# Patient Record
Sex: Male | Born: 1940
Health system: Southern US, Community
[De-identification: ages and names within clinical notes are randomized; demographics above are authoritative.]

## PROBLEM LIST (undated history)

## (undated) DIAGNOSIS — E785 Hyperlipidemia, unspecified: Secondary | ICD-10-CM

## (undated) DIAGNOSIS — K648 Other hemorrhoids: Secondary | ICD-10-CM

## (undated) DIAGNOSIS — R600 Localized edema: Secondary | ICD-10-CM

## (undated) DIAGNOSIS — K219 Gastro-esophageal reflux disease without esophagitis: Secondary | ICD-10-CM

## (undated) DIAGNOSIS — R609 Edema, unspecified: Secondary | ICD-10-CM

## (undated) DIAGNOSIS — R0989 Other specified symptoms and signs involving the circulatory and respiratory systems: Secondary | ICD-10-CM

## (undated) DIAGNOSIS — C801 Malignant (primary) neoplasm, unspecified: Secondary | ICD-10-CM

## (undated) DIAGNOSIS — E1165 Type 2 diabetes mellitus with hyperglycemia: Secondary | ICD-10-CM

## (undated) DIAGNOSIS — I69351 Hemiplegia and hemiparesis following cerebral infarction affecting right dominant side: Secondary | ICD-10-CM

## (undated) DIAGNOSIS — I6389 Other cerebral infarction: Secondary | ICD-10-CM

## (undated) DIAGNOSIS — Z860100 Personal history of colon polyps, unspecified: Secondary | ICD-10-CM

## (undated) DIAGNOSIS — I219 Acute myocardial infarction, unspecified: Secondary | ICD-10-CM

## (undated) DIAGNOSIS — M1990 Unspecified osteoarthritis, unspecified site: Secondary | ICD-10-CM

## (undated) DIAGNOSIS — J392 Other diseases of pharynx: Secondary | ICD-10-CM

## (undated) DIAGNOSIS — J302 Other seasonal allergic rhinitis: Secondary | ICD-10-CM

## (undated) DIAGNOSIS — E1140 Type 2 diabetes mellitus with diabetic neuropathy, unspecified: Secondary | ICD-10-CM

## (undated) DIAGNOSIS — I10 Essential (primary) hypertension: Secondary | ICD-10-CM

## (undated) DIAGNOSIS — I2510 Atherosclerotic heart disease of native coronary artery without angina pectoris: Secondary | ICD-10-CM

## (undated) DIAGNOSIS — I639 Cerebral infarction, unspecified: Secondary | ICD-10-CM

## (undated) DIAGNOSIS — J189 Pneumonia, unspecified organism: Secondary | ICD-10-CM

## (undated) HISTORY — DX: Atherosclerotic heart disease of native coronary artery without angina pectoris: I25.10

## (undated) HISTORY — DX: Other cerebral infarction: I63.89

## (undated) HISTORY — DX: Malignant (primary) neoplasm, unspecified: C80.1

## (undated) HISTORY — DX: Other hemorrhoids: K64.8

## (undated) HISTORY — DX: Type 2 diabetes mellitus with diabetic neuropathy, unspecified: E11.40

## (undated) HISTORY — PX: NASAL SEPTUM SURGERY: SHX37

## (undated) HISTORY — DX: Gastro-esophageal reflux disease without esophagitis: K21.9

## (undated) HISTORY — DX: Hemiplegia and hemiparesis following cerebral infarction affecting right dominant side: I69.351

## (undated) HISTORY — PX: BALLOON ANGIOPLASTY, ARTERY: SHX564

## (undated) HISTORY — DX: Hyperlipidemia, unspecified: E78.5

## (undated) HISTORY — DX: Type 2 diabetes mellitus with hyperglycemia: E11.65

## (undated) HISTORY — DX: Other specified symptoms and signs involving the circulatory and respiratory systems: R09.89

## (undated) HISTORY — DX: Other seasonal allergic rhinitis: J30.2

## (undated) HISTORY — DX: Pneumonia, unspecified organism: J18.9

## (undated) HISTORY — PX: OTHER SURGICAL HISTORY: SHX169

## (undated) HISTORY — DX: Essential (primary) hypertension: I10

## (undated) HISTORY — PX: TONSILLECTOMY AND ADENOIDECTOMY: SUR1326

## (undated) HISTORY — DX: Other diseases of pharynx: J39.2

---

## 1987-03-08 DIAGNOSIS — IMO0002 Reserved for concepts with insufficient information to code with codable children: Secondary | ICD-10-CM

## 1987-03-08 DIAGNOSIS — E1140 Type 2 diabetes mellitus with diabetic neuropathy, unspecified: Secondary | ICD-10-CM

## 1987-03-08 DIAGNOSIS — I639 Cerebral infarction, unspecified: Secondary | ICD-10-CM | POA: Insufficient documentation

## 1987-03-08 HISTORY — DX: Reserved for concepts with insufficient information to code with codable children: IMO0002

## 1987-03-08 HISTORY — DX: Type 2 diabetes mellitus with diabetic neuropathy, unspecified: E11.40

## 1990-03-07 DIAGNOSIS — I259 Chronic ischemic heart disease, unspecified: Secondary | ICD-10-CM | POA: Insufficient documentation

## 1995-03-08 HISTORY — PX: ELBOW SURGERY: SHX618

## 1997-10-15 ENCOUNTER — Other Ambulatory Visit: Admission: RE | Admit: 1997-10-15 | Discharge: 1997-10-15 | Payer: Self-pay | Admitting: Orthopedic Surgery

## 2002-03-07 HISTORY — PX: CARDIAC CATHETERIZATION: SHX172

## 2002-04-03 HISTORY — PX: COLONOSCOPY: SHX174

## 2002-05-10 ENCOUNTER — Ambulatory Visit (HOSPITAL_COMMUNITY): Admission: RE | Admit: 2002-05-10 | Discharge: 2002-05-11 | Payer: Self-pay | Admitting: Cardiology

## 2002-08-29 ENCOUNTER — Encounter: Admission: RE | Admit: 2002-08-29 | Discharge: 2002-08-29 | Payer: Self-pay | Admitting: Family Medicine

## 2002-08-29 ENCOUNTER — Encounter: Payer: Self-pay | Admitting: Family Medicine

## 2004-01-12 ENCOUNTER — Ambulatory Visit: Payer: Self-pay | Admitting: Internal Medicine

## 2004-06-08 ENCOUNTER — Ambulatory Visit: Payer: Self-pay | Admitting: Cardiology

## 2005-02-02 ENCOUNTER — Ambulatory Visit: Payer: Self-pay | Admitting: Cardiology

## 2005-07-06 ENCOUNTER — Ambulatory Visit: Payer: Self-pay | Admitting: Cardiology

## 2006-02-15 ENCOUNTER — Ambulatory Visit: Payer: Self-pay | Admitting: Cardiology

## 2006-04-17 ENCOUNTER — Ambulatory Visit: Payer: Self-pay | Admitting: Internal Medicine

## 2006-08-14 ENCOUNTER — Ambulatory Visit: Payer: Self-pay | Admitting: Family Medicine

## 2007-02-21 ENCOUNTER — Ambulatory Visit: Payer: Self-pay | Admitting: Cardiology

## 2007-03-29 ENCOUNTER — Ambulatory Visit: Payer: Self-pay

## 2007-06-21 ENCOUNTER — Ambulatory Visit: Payer: Self-pay | Admitting: Gastroenterology

## 2007-07-18 ENCOUNTER — Ambulatory Visit: Payer: Self-pay | Admitting: Gastroenterology

## 2007-07-18 HISTORY — PX: COLONOSCOPY: SHX174

## 2008-02-14 ENCOUNTER — Ambulatory Visit: Payer: Self-pay | Admitting: Cardiology

## 2008-03-07 HISTORY — PX: CATARACT EXTRACTION: SUR2

## 2008-12-31 ENCOUNTER — Telehealth (INDEPENDENT_AMBULATORY_CARE_PROVIDER_SITE_OTHER): Payer: Self-pay | Admitting: *Deleted

## 2009-02-09 DIAGNOSIS — I10 Essential (primary) hypertension: Secondary | ICD-10-CM | POA: Insufficient documentation

## 2009-02-10 ENCOUNTER — Encounter: Payer: Self-pay | Admitting: Cardiology

## 2009-02-11 ENCOUNTER — Ambulatory Visit: Payer: Self-pay | Admitting: Cardiology

## 2009-02-11 DIAGNOSIS — R0602 Shortness of breath: Secondary | ICD-10-CM | POA: Insufficient documentation

## 2009-02-11 DIAGNOSIS — R0609 Other forms of dyspnea: Secondary | ICD-10-CM | POA: Insufficient documentation

## 2009-02-11 LAB — CONVERTED CEMR LAB
BUN: 24 mg/dL — ABNORMAL HIGH (ref 6–23)
CO2: 29 meq/L (ref 19–32)
Calcium: 10.3 mg/dL (ref 8.4–10.5)
Chloride: 106 meq/L (ref 96–112)
Creatinine, Ser: 1.1 mg/dL (ref 0.4–1.5)

## 2009-02-13 ENCOUNTER — Ambulatory Visit: Payer: Self-pay

## 2009-02-13 ENCOUNTER — Ambulatory Visit (HOSPITAL_COMMUNITY): Admission: RE | Admit: 2009-02-13 | Discharge: 2009-02-13 | Payer: Self-pay | Admitting: Cardiology

## 2009-02-13 ENCOUNTER — Encounter: Payer: Self-pay | Admitting: Cardiology

## 2009-02-13 ENCOUNTER — Ambulatory Visit: Payer: Self-pay | Admitting: Internal Medicine

## 2009-02-16 ENCOUNTER — Telehealth: Payer: Self-pay | Admitting: Cardiology

## 2009-02-19 ENCOUNTER — Encounter: Payer: Self-pay | Admitting: Cardiology

## 2009-02-20 ENCOUNTER — Ambulatory Visit: Payer: Self-pay | Admitting: Cardiology

## 2009-03-02 ENCOUNTER — Telehealth (INDEPENDENT_AMBULATORY_CARE_PROVIDER_SITE_OTHER): Payer: Self-pay | Admitting: *Deleted

## 2009-03-03 ENCOUNTER — Ambulatory Visit: Payer: Self-pay | Admitting: Cardiovascular Disease

## 2009-03-03 ENCOUNTER — Ambulatory Visit: Payer: Self-pay

## 2009-03-03 ENCOUNTER — Encounter (HOSPITAL_COMMUNITY): Admission: RE | Admit: 2009-03-03 | Discharge: 2009-05-11 | Payer: Self-pay | Admitting: Cardiology

## 2009-03-12 ENCOUNTER — Encounter: Payer: Self-pay | Admitting: Cardiology

## 2009-03-13 ENCOUNTER — Ambulatory Visit: Payer: Self-pay | Admitting: Cardiology

## 2009-03-13 LAB — CONVERTED CEMR LAB
BUN: 15 mg/dL (ref 6–23)
Calcium: 9.9 mg/dL (ref 8.4–10.5)
GFR calc non Af Amer: 71.15 mL/min (ref >60)
Glucose, Bld: 180 mg/dL — ABNORMAL HIGH (ref 70–99)

## 2009-04-01 ENCOUNTER — Encounter: Admission: RE | Admit: 2009-04-01 | Discharge: 2009-04-01 | Payer: Self-pay | Admitting: Internal Medicine

## 2009-04-17 LAB — PSA: PSA: 0.95 ng/mL

## 2009-06-12 ENCOUNTER — Ambulatory Visit: Payer: Self-pay | Admitting: Cardiology

## 2009-06-29 ENCOUNTER — Telehealth: Payer: Self-pay | Admitting: Cardiology

## 2009-07-14 ENCOUNTER — Ambulatory Visit: Payer: Self-pay | Admitting: Cardiology

## 2009-07-17 LAB — CONVERTED CEMR LAB
Calcium: 9.6 mg/dL (ref 8.4–10.5)
Creatinine, Ser: 1.1 mg/dL (ref 0.4–1.5)
GFR calc non Af Amer: 75 mL/min (ref >60)

## 2009-12-09 ENCOUNTER — Ambulatory Visit: Payer: Self-pay | Admitting: Cardiology

## 2010-04-06 NOTE — Assessment & Plan Note (Signed)
Summary: Jeffrey [name]  Medications Added HYDROCHLOROTHIAZIDE 25 MG TABS (HYDROCHLOROTHIAZIDE) Take 1/2 tablet daily      Allergies Added:   Visit Type:  Follow-up Primary Provider:  GEGICK  CC:  hypertension and coronary artery disease.  History of Present Illness: The patient is seen for followup of hypertension and coronary artery disease.  He is actually doing very well.  When I saw him last on March 13, 2009, I decided to keep him on furosemide 40.  Chemistry was checked and was quite stable.  Since that time he has become serious about losing weight and he has lost 10 pounds.  He says he feels very good.  Is not having chest pain shortness of breath or swelling.  Current Medications (verified): 1)  Crestor 40 Mg Tabs (Rosuvastatin Calcium) .... Take 1/2 Tablet By Mouth Daily. 2)  Metoprolol Tartrate 50 Mg Tabs (Metoprolol Tartrate) .... Take One Tablet By Mouth Twice A Day 3)  Accupril 40 Mg Tabs (Quinapril Hcl) .... Take 1 Tablet By Mouth Once A Day 4)  Norvasc 10 Mg Tabs (Amlodipine Besylate) .... Take 1 Tablet By Mouth Once A Day 5)  Aspirin 81 Mg Tbec (Aspirin) .... Take One Tablet By Mouth Daily 6)  H-Tron V100 Insulin Pump  Misc (Insulin Infusion Pump) .... Humalog 7)  Cardura 4 Mg Tabs (Doxazosin Mesylate) .... Take 1 Tablet By Mouth Once A Day 8)  Novolog 100 Unit/ml Soln (Insulin Aspart) .... in Pump 9)  Furosemide 40 Mg Tabs (Furosemide) .... Take One Tablet By Mouth Daily.  Allergies (verified): 1)  * Niacin  Past History:  Past Medical History: Last updated: 03/12/2009 HYPERTENSION, UNSPECIFIED (ICD-401.9) HYPERCHOLESTEROLEMIA  IIA (ICD-272.0) CAD, NATIVE VESSEL (ICD-414.01).Marland KitchenPCI distal RCA ...2004, residual 70% LAD   /   ...nuclear...03/2007...no ischemia.Marland KitchenMarland Kitchenpreserved LV /  nuclear...03/03/2009...inferior scar..no ischemia..EF 51% EF 55-60%....echo...02/13/2009 Dyslipidemia Diabetes adenomatous polyps.  internal hemorrhoids  Review of Systems       Patient denies  fever, chills, headache, sweats, rash, change in vision, change in hearing, chest pain, cough, nausea vomiting, urinary symptoms.  All other systems are reviewed and are negative.  Vital Signs:  Patient profile:   70 year old male Height:      68 inches Weight:      190 pounds BMI:     28.99 Pulse rate:   75 / minute BP sitting:   114 / 68  (left arm) Cuff size:   regular  Vitals Entered By: Hardin Negus, RMA (June 12, 2009 8:43 AM)  Physical Exam  General:  patient is stable today. Eyes:  no xanthelasma. Neck:  no jugular venous attention. Lungs:  lungs are clear.  Respiratory effort is nonlabored. Heart:  cardiac exam reveals S1-S2.  No clicks or significant murmurs. Abdomen:  abdomen soft. Extremities:  no edema. Psych:  patient is oriented to person time and place affect is normal.   Impression & Recommendations:  Problem # 1:  SHORTNESS OF BREATH (ICD-786.05)  The following medications were removed from the medication list:    Furosemide 40 Mg Tabs (Furosemide) .[name]... Take one tablet by mouth daily. His updated medication list for this problem includes:    Metoprolol Tartrate 50 Mg Tabs (Metoprolol tartrate) .[name]... Take one tablet by mouth twice a day    Accupril 40 Mg Tabs (Quinapril hcl) .[name]... Take 1 tablet by mouth once a day    Norvasc 10 Mg Tabs (Amlodipine besylate) .[name]... Take 1 tablet by mouth once a day    Aspirin 81  Mg Tbec (Aspirin) .[name]... Take one tablet by mouth daily    Hydrochlorothiazide 25 Mg Tabs (Hydrochlorothiazide) .[name]... Take 1/2 tablet daily Shortness of breath is significantly improved.  This may be from a combination of excess weight and some very mild fluid overload.  I'm not going to stop his diuretic completely but I will change him to a very low dose of hydrochlorothiazide.  Problem # 2:  HYPERTENSION, UNSPECIFIED (ICD-401.9)  The following medications were removed from the medication list:    Furosemide 40 Mg Tabs (Furosemide) .[name]... Take one  tablet by mouth daily. His updated medication list for this problem includes:    Metoprolol Tartrate 50 Mg Tabs (Metoprolol tartrate) .[name]... Take one tablet by mouth twice a day    Accupril 40 Mg Tabs (Quinapril hcl) .[name]... Take 1 tablet by mouth once a day    Norvasc 10 Mg Tabs (Amlodipine besylate) .[name]... Take 1 tablet by mouth once a day    Aspirin 81 Mg Tbec (Aspirin) .[name]... Take one tablet by mouth daily    Cardura 4 Mg Tabs (Doxazosin mesylate) .[name]... Take 1 tablet by mouth once a day    Hydrochlorothiazide 25 Mg Tabs (Hydrochlorothiazide) .[name]... Take 1/2 tablet daily Blood pressure is under much better control.  Problem # 3:  CAD, NATIVE VESSEL (ICD-414.01)  His updated medication list for this problem includes:    Metoprolol Tartrate 50 Mg Tabs (Metoprolol tartrate) .[name]... Take one tablet by mouth twice a day    Accupril 40 Mg Tabs (Quinapril hcl) .[name]... Take 1 tablet by mouth once a day    Norvasc 10 Mg Tabs (Amlodipine besylate) .[name]... Take 1 tablet by mouth once a day    Aspirin 81 Mg Tbec (Aspirin) .[name]... Take one tablet by mouth daily Coronary disease is stable.  No further workup.  Lasix will be changed to thiazide 12.5 mg daily.  We will obtain followup chemistry in one month.  See back in 6 months.  Patient Instructions: 1)  Your physician recommends that you schedule a follow-up appointment in: 6 months 2)  Your physician recommends that you return for lab work in: 4 weeks BMET 401.1 3)  Your physician has recommended you make the following change in your medication: Stop lasix.  Take HCTZ 12.5mg   daily. Prescriptions: HYDROCHLOROTHIAZIDE 25 MG TABS (HYDROCHLOROTHIAZIDE) Take 1/2 tablet daily  #30 x 6   Entered by:   Lisabeth Devoid RN   Authorized by:   Talitha Givens, MD, [facility]   Signed by:   Lisabeth Devoid RN on 06/12/2009   Method used:   Electronically to        [facility]. [phone]* (retail)       [address]       [place], [place]   [address]       Ph: [phone]       Fax: [phone]   RxID:   [id number]

## 2010-04-06 NOTE — Assessment & Plan Note (Signed)
Summary: per check out/sf      Allergies Added:   Visit Type:  Follow-up Primary Provider:  GEGICK  CC:  shortness of breath.  History of Present Illness: The patient is seen today for cardiology followup.  He has known coronary disease.  He does have shortness of breath.  An echo had been done showing good LV function.  I tried him on a small dose of a diuretic.  He felt mildly improved with this and we decided to proceed with an exercise nuclear scan.  This revealed an old small infarct but no significant ischemia.  He does have EKG changes with stress.  Current Medications (verified): 1)  Crestor 40 Mg Tabs (Rosuvastatin Calcium) .... Take 1/2 Tablet By Mouth Daily. 2)  Metoprolol Tartrate 50 Mg Tabs (Metoprolol Tartrate) .... Take One Tablet By Mouth Twice A Day 3)  Accupril 40 Mg Tabs (Quinapril Hcl) .... Take 1 Tablet By Mouth Once A Day 4)  Norvasc 10 Mg Tabs (Amlodipine Besylate) .... Take 1 Tablet By Mouth Once A Day 5)  Aspirin 81 Mg Tbec (Aspirin) .... Take One Tablet By Mouth Daily 6)  H-Tron V100 Insulin Pump  Misc (Insulin Infusion Pump) .... Humalog 7)  Cardura 4 Mg Tabs (Doxazosin Mesylate) .... Take 1 Tablet By Mouth Once A Day 8)  Novolog 100 Unit/ml Soln (Insulin Aspart) .... in Pump 9)  Furosemide 40 Mg Tabs (Furosemide) .... Take One Tablet By Mouth Daily.  Allergies (verified): 1)  * Niacin  Past History:  Past Medical History: Last updated: 03/12/2009 HYPERTENSION, UNSPECIFIED (ICD-401.9) HYPERCHOLESTEROLEMIA  IIA (ICD-272.0) CAD, NATIVE VESSEL (ICD-414.01).Marland KitchenPCI distal RCA ...2004, residual 70% LAD   /   ...nuclear...03/2007...no ischemia.Marland KitchenMarland Kitchenpreserved LV /  nuclear...03/03/2009...inferior scar..no ischemia..EF 51% EF 55-60%....echo...02/13/2009 Dyslipidemia Diabetes adenomatous polyps.  internal hemorrhoids  Review of Systems       Patient denies fever, chills, headache, sweats, rash, change in vision, change in hearing, chest pain, cough, nausea  vomiting, urinary symptoms.  All of the systems are reviewed and are negative.  Vital Signs:  Patient profile:   70 year old male Height:      68 inches Weight:      200 pounds BMI:     30.52 Pulse rate:   75 / minute BP sitting:   146 / 74  (left arm) Cuff size:   regular  Vitals Entered By: Hardin Negus, RMA (March 13, 2009 10:07 AM)  Physical Exam  General:  patient is stable and feels well today. Eyes:  no xanthelasma. Neck:  no jugular venous distention. Lungs:  lungs are clear.  Respiratory effort is nonlabored. Heart:  cardiac exam reveals S1 and S2.  No clicks or significant murmurs. Abdomen:  abdomen is soft. Extremities:  no peripheral edema. Psych:  patient is oriented to person time and place.  Affect is normal.   Impression & Recommendations:  Problem # 1:  SHORTNESS OF BREATH (ICD-786.05)  His updated medication list for this problem includes:    Metoprolol Tartrate 50 Mg Tabs (Metoprolol tartrate) .[name]... Take one tablet by mouth twice a day    Accupril 40 Mg Tabs (Quinapril hcl) .[name]... Take 1 tablet by mouth once a day    Norvasc 10 Mg Tabs (Amlodipine besylate) .[name]... Take 1 tablet by mouth once a day    Aspirin 81 Mg Tbec (Aspirin) .[name]... Take one tablet by mouth daily    Furosemide 40 Mg Tabs (Furosemide) .[name]... Take one tablet by mouth daily. Shortness of breath slightly  better.  I believe it is due to his physical inactivity.  Now that his nuclear scan shows no ischemia he will increase his exercise program.  He has lost 2 pounds since his last visit and we will continue to try to lose some weight.  All seem back in 3 months for early followup to be sure he is stable.  He does have residual LAD disease. chemistry lab on February 11, 2009, revealed a BUN of 24 creatinine 1.1.  With these lab stable I will keep him on furosemide at this time but I may consider stopping it at the time of his next visit.  Orders: TLB-BMP (Basic Metabolic Panel-BMET)  (80048-METABOL)  Problem # 2:  HYPERTENSION, UNSPECIFIED (ICD-401.9)  His updated medication list for this problem includes:    Metoprolol Tartrate 50 Mg Tabs (Metoprolol tartrate) .[name]... Take one tablet by mouth twice a day    Accupril 40 Mg Tabs (Quinapril hcl) .[name]... Take 1 tablet by mouth once a day    Norvasc 10 Mg Tabs (Amlodipine besylate) .[name]... Take 1 tablet by mouth once a day    Aspirin 81 Mg Tbec (Aspirin) .[name]... Take one tablet by mouth daily    Cardura 4 Mg Tabs (Doxazosin mesylate) .[name]... Take 1 tablet by mouth once a day    Furosemide 40 Mg Tabs (Furosemide) .[name]... Take one tablet by mouth daily. Blood pressure stable today.  Problem # 3:  HYPERCHOLESTEROLEMIA  IIA (ICD-272.0)  His updated medication list for this problem includes:    Crestor 40 Mg Tabs (Rosuvastatin calcium) .[name]... Take 1/2 tablet by mouth daily. patient had labs checked by Dr.Gegick recently.  He does not know the results yet.  He will followup.  Patient Instructions: 1)  Lab today 2)  Follow up in 3 months

## 2010-04-06 NOTE — Miscellaneous (Signed)
  Clinical Lists Changes  Observations: Added new observation of PAST MED HX: HYPERTENSION, UNSPECIFIED (ICD-401.9) HYPERCHOLESTEROLEMIA  IIA (ICD-272.0) CAD, NATIVE VESSEL (ICD-414.01).Marland KitchenPCI distal RCA ...2004, residual 70% LAD   /   ...nuclear...03/2007...no ischemia.Marland KitchenMarland Kitchenpreserved LV /  nuclear...03/03/2009...inferior scar..no ischemia..EF 51% EF 55-60%....echo...02/13/2009 Dyslipidemia Diabetes adenomatous polyps.  internal hemorrhoids (03/12/2009 18:59) Added new observation of PRIMARY MD: GEGICK (03/12/2009 18:59)       Past History:  Past Medical History: HYPERTENSION, UNSPECIFIED (ICD-401.9) HYPERCHOLESTEROLEMIA  IIA (ICD-272.0) CAD, NATIVE VESSEL (ICD-414.01).Marland KitchenPCI distal RCA ...2004, residual 70% LAD   /   ...nuclear...03/2007...no ischemia.Marland KitchenMarland Kitchenpreserved LV /  nuclear...03/03/2009...inferior scar..no ischemia..EF 51% EF 55-60%....echo...02/13/2009 Dyslipidemia Diabetes adenomatous polyps.  internal hemorrhoids

## 2010-04-06 NOTE — Progress Notes (Signed)
Summary: which meds pt suppose to stop   Phone Note Call from Patient Call back at Home Phone [phone]   Caller: Patient Reason for Call: Talk to Nurse Summary of Call: which meds pt suppose to stop.  Initial call taken by: Lorne Skeens,  June 29, 2009 2:07 PM  Follow-up for Phone Call        spoke w/pt aware to stop furosemide Meredith Staggers, RN  June 29, 2009 2:15 PM

## 2010-04-06 NOTE — Assessment & Plan Note (Signed)
Summary: PER CHECK OUT/SF      Allergies Added:   Visit Type:  Follow-up Primary Trinaty Bundrick:  GEGICK  CC:  CAD .  History of Present Illness: The patient is seen for followup of coronary artery disease.  I saw him last April, 2011.  We have been evaluating some shortness of breath.  He had a nuclear scan revealing no ischemia.  His ejection fraction is in the 55% range.  He has not had any further problems since then.  He is on a very small dose of hydrochlorothiazide for blood pressure and volume control.  He's had no chest pain.  Current Medications (verified): 1)  Crestor 40 Mg Tabs (Rosuvastatin Calcium) .... Take 1/2 Tablet By Mouth Daily. 2)  Metoprolol Tartrate 50 Mg Tabs (Metoprolol Tartrate) .... Take One Tablet By Mouth Twice A Day 3)  Accupril 40 Mg Tabs (Quinapril Hcl) .... Take 1 Tablet By Mouth Once A Day 4)  Norvasc 10 Mg Tabs (Amlodipine Besylate) .... Take 1 Tablet By Mouth Once A Day 5)  Aspirin 81 Mg Tbec (Aspirin) .... Take One Tablet By Mouth Daily 6)  H-Tron V100 Insulin Pump  Misc (Insulin Infusion Pump) .... Humalog 7)  Cardura 4 Mg Tabs (Doxazosin Mesylate) .... Take 1 Tablet By Mouth Once A Day 8)  Novolog 100 Unit/ml Soln (Insulin Aspart) .... in Pump 9)  Hydrochlorothiazide 25 Mg Tabs (Hydrochlorothiazide) .... Take 1/2 Tablet Daily  Allergies (verified): 1)  * Niacin  Past History:  Past Medical History: HYPERTENSION, UNSPECIFIED (ICD-401.9) HYPERCHOLESTEROLEMIA  IIA (ICD-272.0) CAD, NATIVE VESSEL (ICD-414.01).Marland KitchenPCI distal RCA ...2004, residual 70% LAD   /   ...nuclear...03/2007...no ischemia.Marland KitchenMarland Kitchenpreserved LV /  nuclear...03/03/2009...inferior scar..no ischemia..EF 51% EF 55-60%....echo...02/13/2009 Dyslipidemia Diabetes adenomatous polyps.  internal hemorrhoids  Review of Systems       Patient denies fever, chills, headache, sweats, rash, change in vision, change in hearing, chest pain, cough, nausea vomiting, urinary symptoms.  All the systems are  reviewed and are negative.  Vital Signs:  Patient profile:   70 year old male Height:      68 inches Weight:      193 pounds BMI:     29.45 Pulse rate:   65 / minute BP sitting:   120 / 66  (left arm) Cuff size:   regular  Vitals Entered By: Hardin Negus, RMA (December 09, 2009 11:06 AM)  Physical Exam  General:  he looks quite good today. Eyes:  no xanthelasma. Neck:  no jugular venous distention. Lungs:  lungs are clear respiratory effort is nonlabored. Heart:  cardiac exam reveals an S1-S2.  No clicks or significant murmurs. Abdomen:  abdomen soft. Extremities:  no peripheral edema. Psych:  patient is oriented to person time and place.  Affect is normal.   Impression & Recommendations:  Problem # 1:  SHORTNESS OF BREATH (ICD-786.05)  His updated medication list for this problem includes:    Metoprolol Tartrate 50 Mg Tabs (Metoprolol tartrate) .[name]... Take one tablet by mouth twice a day    Accupril 40 Mg Tabs (Quinapril hcl) .[name]... Take 1 tablet by mouth once a day    Norvasc 10 Mg Tabs (Amlodipine besylate) .[name]... Take 1 tablet by mouth once a day    Aspirin 81 Mg Tbec (Aspirin) .[name]... Take one tablet by mouth daily    Hydrochlorothiazide 25 Mg Tabs (Hydrochlorothiazide) .[name]... Take 1/2 tablet daily Is not having a problem with shortness of breath.  No further workup.  Problem # 2:  HYPERTENSION, UNSPECIFIED (  ICD-401.9)  His updated medication list for this problem includes:    Metoprolol Tartrate 50 Mg Tabs (Metoprolol tartrate) .[name]... Take one tablet by mouth twice a day    Accupril 40 Mg Tabs (Quinapril hcl) .[name]... Take 1 tablet by mouth once a day    Norvasc 10 Mg Tabs (Amlodipine besylate) .[name]... Take 1 tablet by mouth once a day    Aspirin 81 Mg Tbec (Aspirin) .[name]... Take one tablet by mouth daily    Cardura 4 Mg Tabs (Doxazosin mesylate) .[name]... Take 1 tablet by mouth once a day    Hydrochlorothiazide 25 Mg Tabs (Hydrochlorothiazide) .[name]... Take 1/2 tablet daily Blood  pressure is well-controlled.  No change in therapy.  Problem # 3:  CAD, NATIVE VESSEL (ICD-414.01)  His updated medication list for this problem includes:    Metoprolol Tartrate 50 Mg Tabs (Metoprolol tartrate) .[name]... Take one tablet by mouth twice a day    Accupril 40 Mg Tabs (Quinapril hcl) .[name]... Take 1 tablet by mouth once a day    Norvasc 10 Mg Tabs (Amlodipine besylate) .[name]... Take 1 tablet by mouth once a day    Aspirin 81 Mg Tbec (Aspirin) .[name]... Take one tablet by mouth daily Coronary disease is stable.  No change in therapy.  Patient Instructions: 1)  Your physician wants you to follow-up in:  1 year.  You will receive a reminder letter in the mail two months in advance. If you don't receive a letter, please call our office to schedule the follow-up appointment.

## 2010-04-19 LAB — COMPREHENSIVE METABOLIC PANEL
Albumin: 4.2
Alkaline Phosphatase: 89 U/L
Calcium: 9.2 mg/dL
Glucose: 125
Sodium: 137 mmol/L (ref 137–147)
Total Bilirubin: 0.6 mg/dL
Total Protein: 6.8 g/dL

## 2010-04-19 LAB — LIPID PANEL: Direct LDL: 119

## 2010-06-21 ENCOUNTER — Telehealth: Payer: Self-pay | Admitting: Cardiology

## 2010-06-25 MED ORDER — HYDROCHLOROTHIAZIDE 25 MG PO TABS: ORAL_TABLET | ORAL | Status: DC

## 2010-06-25 NOTE — Telephone Encounter (Signed)
Pt needs his hctz to be called into [facility] # [phone]

## 2010-07-01 ENCOUNTER — Encounter: Payer: Self-pay | Admitting: Cardiology

## 2010-07-01 DIAGNOSIS — E1169 Type 2 diabetes mellitus with other specified complication: Secondary | ICD-10-CM | POA: Insufficient documentation

## 2010-07-01 DIAGNOSIS — I10 Essential (primary) hypertension: Secondary | ICD-10-CM | POA: Insufficient documentation

## 2010-07-01 DIAGNOSIS — E1149 Type 2 diabetes mellitus with other diabetic neurological complication: Secondary | ICD-10-CM | POA: Insufficient documentation

## 2010-07-01 DIAGNOSIS — E1159 Type 2 diabetes mellitus with other circulatory complications: Secondary | ICD-10-CM | POA: Insufficient documentation

## 2010-07-01 DIAGNOSIS — E785 Hyperlipidemia, unspecified: Secondary | ICD-10-CM | POA: Insufficient documentation

## 2010-07-01 DIAGNOSIS — I152 Hypertension secondary to endocrine disorders: Secondary | ICD-10-CM | POA: Insufficient documentation

## 2010-07-01 DIAGNOSIS — E1140 Type 2 diabetes mellitus with diabetic neuropathy, unspecified: Secondary | ICD-10-CM | POA: Insufficient documentation

## 2010-07-01 DIAGNOSIS — R9430 Abnormal result of cardiovascular function study, unspecified: Secondary | ICD-10-CM | POA: Insufficient documentation

## 2010-07-02 ENCOUNTER — Encounter: Payer: Self-pay | Admitting: Cardiology

## 2010-07-02 ENCOUNTER — Ambulatory Visit (INDEPENDENT_AMBULATORY_CARE_PROVIDER_SITE_OTHER): Payer: Medicare Other | Admitting: Cardiology

## 2010-07-02 VITALS — BP 122/79 | HR 63 | Ht 68.0 in | Wt 198.0 lb

## 2010-07-02 DIAGNOSIS — R55 Syncope and collapse: Secondary | ICD-10-CM

## 2010-07-02 DIAGNOSIS — I10 Essential (primary) hypertension: Secondary | ICD-10-CM

## 2010-07-02 DIAGNOSIS — I2510 Atherosclerotic heart disease of native coronary artery without angina pectoris: Secondary | ICD-10-CM

## 2010-07-02 NOTE — Assessment & Plan Note (Signed)
I don't believe that the patient's episode was from standing in the heat for a prolonged period of time.  Over many years he has had some mild presyncopal spells.  Orthostatic blood pressures checked today.  There is no sign of orthostasis.  He feels much better.  His ejection fraction was normal in December, 2010.  I believe he does not need any further cardiac workup at this time.

## 2010-07-02 NOTE — Patient Instructions (Signed)
Your physician recommends that you schedule a follow-up appointment in: 6 months with Dr. Katz  

## 2010-07-02 NOTE — Progress Notes (Signed)
HPI Patient is seen today added to the schedule for the evaluation of a presyncopal episode.  I saw him last in the office in October, 2011.  His overall cardiac status has been stable.  He does have coronary disease.  Recently he was standing outside in the heat for a prolonged period of time.  He began to feel weak and eventually sat down in a chair.  Then he became more weak.  He did not have a true syncopal episode.  EMS was called and he stabilized.  He went home.  He felt a little weak for a few days and now feels fine. Allergies  Allergen Reactions  . Niacin     REACTION: intolerant,not allergic    Current Outpatient Prescriptions  Medication Sig Dispense Refill  . amLODipine (NORVASC) 10 MG tablet Take 10 mg by mouth daily.        [name] aspirin 81 MG tablet Take 81 mg by mouth daily.        [name] doxazosin (CARDURA) 4 MG tablet Take 2 mg by mouth 2 (two) times daily.       . fosinopril ([name]) 40 MG tablet Take 40 mg by mouth daily.        . furosemide (LASIX) 40 MG tablet Take 40 mg by mouth daily.        . hydrochlorothiazide 25 MG tablet        . insulin aspart (NOVOLOG) 100 UNIT/ML injection In pump       . Insulin Infusion Pump (H-TRON V100 INSULIN PUMP) MISC by Does not apply route. humalog       . metoprolol (LOPRESSOR) 50 MG tablet Take 50 mg by mouth 2 (two) times daily.        . pantoprazole (PROTONIX) 40 MG tablet Take 40 mg by mouth daily.        . rosuvastatin (CRESTOR) 40 MG tablet Take 20 mg by mouth daily.        [name] DISCONTD: hydrochlorothiazide 25 MG tablet 1/2 tab po qd  30 tablet  6  . DISCONTD: quinapril (ACCUPRIL) 40 MG tablet Take 40 mg by mouth daily.          History   Social History  . Marital Status: Married    Spouse Name: N/A    Number of Children: N/A  . Years of Education: N/A   Occupational History  . Not on file.   Social History Main Topics  . Smoking status: Former Games developer  . Smokeless tobacco: Not on file  . Alcohol Use: No  . Drug Use: No    . Sexually Active: Not on file   Other Topics Concern  . Not on file   Social History Narrative  . No narrative on file    No family history on file.  Past Medical History  Diagnosis Date  . HTN (hypertension)   . CAD (coronary artery disease)     PCI distal RCA ...2004, residual 70% LAD   /   ...nuclear...03/2007...no ischemia.Marland KitchenMarland Kitchenpreserved LV /  nuclear...03/03/2009...inferior scar..no ischemia..EF 51%  . Dyslipidemia   . Diabetes mellitus   . Adenomatous polyps   . Internal hemorrhoids   . Hx of colonoscopy   . Pre-syncope     Presyncopal episode while standing in the heat April, 2012  . Ejection fraction     EF 55-60%, echo, December, 2010    No past surgical history on file.  ROS  Patient denies fever, chills, headache, sweats, rash, change in  vision, change in hearing, chest pain, cough, nausea vomiting, urinary symptoms.  All other systems are reviewed and are negative  PHYSICAL EXAM Patient is oriented to person time and place.  Affect is normal.  His 4 it is still mildly sunburned from his episode.  Head is atraumatic.  There is no xanthelasma.  There is no carotid bruit.  There is no jugulovenous distention.  Lungs are clear.  Respiratory effort is nonlabored.  Cardiac exam reveals S1 and S2.  No clicks or significant murmurs.  The abdomen is soft.  There is no peripheral edema.  There are no musculoskeletal deformities.  No skin rashes other than his mild sunburn. Filed Vitals:   07/02/10 1112 07/02/10 1113 07/02/10 1114 07/02/10 1115  BP: 131/81 121/73 128/74 122/79  Pulse: 64 63 64 63  Height:      Weight:        EKG  EKG today reveals mild sinus bradycardia.  There is no significant change.  EKG is reviewed by me.  ASSESSMENT & PLAN

## 2010-07-02 NOTE — Assessment & Plan Note (Signed)
Coronary disease is stable.  Patient had a nuclear scan in December, 2010.  There was no ischemia.  No further workup is needed.

## 2010-07-02 NOTE — Assessment & Plan Note (Signed)
Blood pressure stable.  There is no orthostasis.  No change in his medicines.

## 2010-07-20 NOTE — Assessment & Plan Note (Signed)
[facility]                            CARDIOLOGY OFFICE NOTE   Jeffrey, [name]                      MRN:          [id number]  DATE:02/14/2008                            DOB:          [birth year]    Jeffrey [name] is doing well.  He is not having chest pain or shortness of  breath.  He feels some fatigue.  He knows that his diabetes is not well  controlled.  He knows that Dr. Dagoberto Ligas is working with him.  He is  overweight.  I talked with him today about Weight Watchers and he is to  consider giving it to try with his wife to support him.  He does have  coronary artery disease.  We did a followup nuclear exercise test in  January 2009.  It showed no marked ischemia.   PAST MEDICAL HISTORY:   ALLERGIES:  No known drug allergies.   MEDICATIONS:  See the flow sheet.   OTHER MEDICAL PROBLEMS:  See the complete list below.   REVIEW OF SYSTEMS:  He is not having any GI or GU symptoms.  He has no  fevers, chills, or skin rashes.  His review of systems is negative.   PHYSICAL EXAMINATION:  VITAL SIGNS:  Blood pressure is 124/76 with a  pulse of 70.  GENERAL:  The patient is oriented to person, time, and place.  Affect is  normal.  HEENT:  No xanthelasma.  He has normal extraocular motion.  NECK:  There are no carotid bruits.  There is no jugular venous  distention.  LUNGS:  Clear.  Respiratory effort is not labored.  CARDIAC:  An S1 with an S2.  There are no clicks or significant murmurs.  ABDOMEN:  Soft.  EXTREMITIES:  He has no peripheral edema.   EKG reveals no significant abnormality.   PROBLEMS:  #1.  Coronary artery disease.  He has had interventions in  the past.  His last Myoview was in January 2009 and showed no  significant ischemia.  He had an intervention to his distal right in  2004.  He has residual 70% left anterior descending lesion.  This has  been stable.  #2.  Low-normal left ventricular function.  #3.  Diabetes.  #4.   Hypercholesterolemia.  #5.  Hypertension, treated.  #6.  History of adenomatous polyps.  #7.  History of internal hemorrhoids.   Jeffrey [name] is stable.  I have a complete discussion with him today and a  highly recommended Weight Watchers to him.  I will see him back in  Cardiology followup in 1 year.     [name], MD, [facility]  Electronically Signed    JDK/MedQ  DD: 02/14/2008  DT: 02/15/2008  Job #: [id number]   cc:   Alfonse Alpers. Dagoberto Ligas, M.D.

## 2010-07-20 NOTE — Assessment & Plan Note (Signed)
[facility]                            CARDIOLOGY OFFICE NOTE   Jeffrey [name], Jeffrey [name]                      MRN:          [id number]  DATE:02/21/2007                            DOB:          Jul 18, 1940    The patient is doing very well.  He is not having any chest pain.  He  does have known coronary disease.  He has gained some weight, and we  talked about this, and he will begin to lose some weight.  He has not  had any chest pain or syncope or presyncope.   PAST MEDICAL HISTORY:   ALLERGIES:  No known drug allergies.   MEDICATIONS:  1. Lipitor.  2. Norvasc.  3. Metoprolol.  4. Accupril.  5. Cardura.  6. Zetia.  7. NovoLog with a pump.  8. Aspirin.   OTHER MEDICAL PROBLEMS:  See the list below.   REVIEW OF SYSTEMS:  His review of systems is negative.  He feels fine  and he is doing well.   PHYSICAL EXAMINATION:  Blood pressure is 132/74 with a pulse of 59.  The patient is oriented to person, time, and place and his affect is  normal.  HEENT:  Reveals no xanthelasma.  He has normal extraocular  motion.  There are no carotid bruits.  There is no jugular venous distension.  LUNGS:  Clear.  Respiratory effort is not labored.  CARDIAC EXAM:  Reveals an S1 with an S2.  There are no clicks or  significant murmurs.  The abdomen is soft.  He has no peripheral edema.   EKG reveals mild sinus bradycardia.   PROBLEMS:  1. Coronary disease with interventions in the past.  It is now 4 years      since his last exercise test, and this will be arranged.  2. Diabetes.  3. Hypercholesterolemia.  4. Hypertension.   These are all treated very carefully by Dr. Dagoberto Ligas.   Jeffrey [name] is stable.  He does need a stress Myoview.  We will see him  back next year.     [name], MD, [facility]  Electronically Signed    JDK/MedQ  DD: 02/21/2007  DT: 02/21/2007  Job #: [id number]   cc:   Alfonse Alpers. Dagoberto Ligas, M.D.

## 2010-07-20 NOTE — Assessment & Plan Note (Signed)
[facility]                         GASTROENTEROLOGY OFFICE NOTE   EVERET, FLAGG                      MRN:          [id number]  DATE:06/21/2007                            DOB:          06/28/1940    REFERRING PHYSICIAN:  Billie D. Bean, FNP   REASON FOR CONSULTATION:  Personal history of adenomatous colon polyps,  insulin dependent diabetes mellitus.   HISTORY OF PRESENT ILLNESS:  Mr. Whitesel is a 70 year old white male who  underwent colonoscopy in January of 2004 for constipation.  A single  small adenomatous colon polyp was removed.  Small internal hemorrhoids  were noted.  He returns for surveillance colonoscopy with no ongoing  colorectal complaints.  He specifically denies any change in bowel  habits, change in stool caliber, abdominal pain, rectal pain, weight  loss, melena, hematochezia, diarrhea, or constipation.  He has insulin  diabetes mellitus and he is maintained on an insulin pump and followed  by Dr. Alfonse Alpers. Dagoberto Ligas, M.D.   FAMILY HISTORY:  Negative for colon cancer, colon polyps, and  inflammatory bowel disease.   PAST MEDICAL HISTORY:  1. Hypertension.  2. Diabetes mellitus managed on an insulin pump.  3. Hyperlipidemia.  4. Coronary artery disease.  5. Prior myocardial infarction.  6. Adenomatous colon polyps.  7. Internal hemorrhoids.  8. Prior coronary artery angioplasty.   PAST SURGICAL HISTORY:  1. Status post nasal surgery.  2. Status post right elbow surgery.   CURRENT MEDICATIONS:  Listed on the chart, updated, and reviewed.   MEDICATION ALLERGIES:  None known.   SOCIAL HISTORY/REVIEW OF SYSTEMS:  Per the handwritten form.   PHYSICAL EXAM:  Well-developed, well-nourished white male in no acute  distress.  Height 5 feet, 9 inches, weight 192.4 pounds.  Blood pressure is 116/62, pulse 68 and regular.  HEENT:  Anicteric sclerae.  Oropharynx clear.  CHEST:  Clear to auscultation bilaterally.  CARDIAC:  Regular, rate and rhythm without murmurs appreciated.  ABDOMEN:  Soft, nontender, nondistended, normal active bowel sounds.  No  palpable organomegaly, masses, or hernias.  RECTAL:  Deferred to colonoscopy.  EXTREMITIES:  Without clubbing, cyanosis, or edema.  NEUROLOGIC:  Alert and oriented x3.  Grossly nonfocal.   ASSESSMENT/PLAN:  Personal history of adenomatous colon polyps, due for  5 year surveillance colonoscopy.  The patient is advised to contact Dr.  Dagoberto Ligas for specific instructions on management of his insulin pump  during the clear liquid bowel preparation and for the day of the  colonoscopy.  Risks, benefits, and alternatives to colonoscopy with  possible biopsy and possible polypectomy discussed with the patient and  he consents to proceed and this will be scheduled electively.     Venita Lick. Russella Dar, MD, [facility]  Electronically Signed    MTS/MedQ  DD: 07/04/2007  DT: 07/04/2007  Job #: [id number]   cc:   Willaim Sheng D. Bean, FNP

## 2010-07-23 NOTE — Cardiovascular Report (Signed)
NAME:  Jeffrey [name], Jeffrey [name]                         ACCOUNT NO.:  [id number]   MEDICAL RECORD NO.:  [id number]                   PATIENT TYPE:  OIB   LOCATION:  6531                                 FACILITY:  [facility]   PHYSICIAN:  Charlies Constable, M.D. [facility]              DATE OF BIRTH:  [birth year]   DATE OF PROCEDURE:  05/10/2002  DATE OF DISCHARGE:  05/11/2002                              CARDIAC CATHETERIZATION   PROCEDURE:  1. Left heart catheterization.  2. Selective coronary angiography.  3. Left ventriculography.  4. Angioplasty of the right coronary artery.   CARDIOLOGIST:  Charlies Constable, M.D.   INDICATIONS:  The patient is 70 years old and had a remote DMI and was  treated with PTCA of the right coronary artery by Dr. Tresa Endo.  He had an  abnormal Cardiolite scan a few years ago but declined to have further  evaluation.  Over the last number of months he has had increasing symptoms  of shoulder pain with exertion.  He saw Dr. Myrtis Ser who felt these symptoms  were probably ischemic and arranged for him to come in for evaluation  angiography.   DESCRIPTION OF PROCEDURE:  Left heart catheterization was performed  percutaneously through the right femoral vein using an arterial sheath and 6  French preformed coronary catheters.  A front wall arterial puncture was  performed, and Omnipaque contrast was used.  After completion of the  diagnostic study, I made a decision to proceed with intervention on the  posterior lateral branches of the right coronary artery.   The patient was given Angiomax bolus and infusion and was given 300 mg of  Plavix.  We used a right saphenous vein bypass graft guiding catheter with  side holes and a Grafix PT wire.  We crossed the lesion in the distal right  coronary artery with the wire without difficulty.  We used a 2.25 x 6 mm  cutting balloon and were able to advance this across the lesion with some  difficulty.  We then inflated the cutting balloon up to 10  atmospheres for  five inflations.  Repeat diagnostic study was then performed through a  guiding catheter.  The patient tolerated the procedure well and left the  laboratory in satisfactory condition.   RESULTS:  The aortic pressure was 139/38 with a mean of 90 and left  ventricular pressure was 139/21.   Left main coronary artery:  The left main coronary artery had a 30%  narrowing in its proximal portion.   Left anterior descending:  The left anterior descending artery gave rise to  two diagonal branches and there septal perforators.  There was a long area  of segmental narrowing in the proximal vessel estimated at 70%.  Circumflex artery:  The circumflex artery gave rise to a marginal branch and  an AV branch.  These vessels were free of significant disease.   Right coronary artery:  The right coronary artery is a moderate size vessel  that gave rise to three right ventricular branches, a posterior descending  branch and two posterior lateral branches.  There was 60% narrowing in the  proximal vessel.  There was marked tortuosity, calcification and  irregularities in the mid vessel.  There was an 80% narrowing in the distal  right coronary artery just after the posterior descending branch supplying  two posterior lateral branches.   LEFT VENTRICULOGRAM:  The left ventriculogram performed in the ROA  projection showed hypokinesis of the inferobasal and mid inferior wall.  The  overall wall motion looks good with an estimated ejection fraction of 60%.   Following PTCA of the lesion, the distal right coronary artery was noticed  to improve from 80% to 10%.   CONCLUSION:  1. Coronary artery disease status post remote percutaneous transluminal     coronary angioplasty of the right coronary artery after a documented wall     infarction with 30% narrowing in the left main coronary artery, 70%     narrowing in the proximal left anterior descending, no significant     obstruction in  the circumflex artery, 60% narrowing in the proximal right     coronary artery and 80% narrowing in the distal right coronary artery     just after the posterior descending branch with inferior wall     hypokinesis.  2. Successful cutting balloon angioplasty of the lesion in the distal right     coronary artery with improvement in % diameter narrowing from 80% to 10%.   DISPOSITION:  The patient was returned to the postangioplasty unit for  further observation.  We will plan an outpatient Cardiolite scan in two  weeks to assess the significance of the LAD lesion.  If his scan is  negative, then we will just plan continued medical therapy.                                                Charlies Constable, M.D. [facility]    BB/MEDQ  D:  05/10/2002  T:  05/11/2002  Job:  [id number]

## 2010-07-23 NOTE — Discharge Summary (Signed)
   NAME:  Jeffrey [name], Jeffrey [name]                         ACCOUNT NO.:  [id number]   MEDICAL RECORD NO.:  [id number]                   PATIENT TYPE:  OIB   LOCATION:  6531                                 FACILITY:  [facility]   PHYSICIAN:  Charlies Constable, M.D. [facility]              DATE OF BIRTH:  [birth year]   DATE OF ADMISSION:  05/10/2002  DATE OF DISCHARGE:  05/11/2002                           DISCHARGE SUMMARY - REFERRING   HISTORY OF PRESENT ILLNESS:  The patient is a 70 year old white male with  known coronary disease per prior PTCA of his RCA in 1992.  He saw Willa Rough, M.D., on May 07, 2002, at which time he was having some increasing  symptoms suggestive of coronary insufficiency.  He had a borderline positive  stress test in 2003.   HOSPITAL COURSE:  The decision was made to proceed with a cardiac  catheterization.  This was done by Charlies Constable, M.D., on May 10, 2002, at  which time he had a 60% proximal right, an 80% posterolateral branch of the  RCA that was dilated 10%, a 30% left main, and a 70% mid LAD.  The ejection  fraction was well preserved.  The posterolateral branch was dilated to less  than 10%, but there were no other interventions.  Dr. Juanda Chance suggested that  we get a Cardiolite in a couple of weeks to evaluate the significance of the  LAD and then follow with Dr. Myrtis Ser.   Dictation ended at this point.     Dian Queen, P.A. [facility]                     Charlies Constable, M.D. [facility]    BY/MEDQ  D:  05/11/2002  T:  05/12/2002  Job:  [id number]   cc:   Alfonse Alpers. Dagoberto Ligas, M.D.  [address]  [place]  [place] [address]  Fax: [phone]   Rosalyn Gess. Norins, M.D. [facility]

## 2010-07-23 NOTE — Assessment & Plan Note (Signed)
[facility]                            CARDIOLOGY OFFICE NOTE   OSHAE, SIMMERING                      MRN:          [id number]  DATE:02/15/2006                            DOB:          1940-11-18    [name] is doing very well.  He is having no chest pain or shortness  of breath.  He has lost a few pounds.  He is exercising at [employer].  He is fully active, taking his medicines, is following up with Dr.  Dagoberto Ligas.   He does have coronary disease and he has had interventions in the past.  His last exercise test was in 2004.  He does not need a study at this  time.   PAST MEDICAL HISTORY:   ALLERGIES:  No known drug allergies.   MEDICATIONS:  1. Lipitor 80.  2. Norvasc 10.  3. Metoprolol 50 b.i.d.  4. Accupril 40.  5. Cardura 4.  6. Aspirin 325.  7. Zetia 10.  8. NovoLog insulin as directed.   OTHER MEDICAL PROBLEMS:  See the complete list below.   REVIEW OF SYSTEMS:  He is doing very well and his review of systems is  negative.   PHYSICAL EXAM:  Weight is 187 pounds on our scale.  Blood pressure  115/80 with a pulse of 61.  The patient is oriented to person, time, and place and his affect is  normal.  LUNGS:  Clear.  Respiratory effort is not labored.  EYE:  No xanthelasma.  He has normal extraocular motion.  There are no carotid bruits.  There is no jugular venous distention.  CARDIAC:  S1 and S2.  There are no clicks or significant murmurs.  ABDOMEN:  Soft.  There are no masses or bruits.  He has 2+ distal  pulses.  There is no peripheral edema.   EKG is normal.   PROBLEMS:  Include:  1. Coronary artery disease with interventions in the past.  I will see      him in 9 months.  We will consider a Myoview scan at that time.  2. Diabetes, treated.  3. Hypercholesterolemia, treated.  4. Hypertension, treated.   His aspirin dose can now be changed from 325 to 81 mg with the current  recommendations.     [name],  MD, [facility]  Electronically Signed    JDK/MedQ  DD: 02/15/2006  DT: 02/15/2006  Job #: [id number]   cc:   Alfonse Alpers. Dagoberto Ligas, M.D.

## 2010-10-14 ENCOUNTER — Ambulatory Visit (INDEPENDENT_AMBULATORY_CARE_PROVIDER_SITE_OTHER): Payer: Medicare Other | Admitting: Family Medicine

## 2010-10-14 ENCOUNTER — Encounter: Payer: Self-pay | Admitting: Family Medicine

## 2010-10-14 DIAGNOSIS — E119 Type 2 diabetes mellitus without complications: Secondary | ICD-10-CM

## 2010-10-14 DIAGNOSIS — E785 Hyperlipidemia, unspecified: Secondary | ICD-10-CM

## 2010-10-14 DIAGNOSIS — I10 Essential (primary) hypertension: Secondary | ICD-10-CM

## 2010-10-14 DIAGNOSIS — I2510 Atherosclerotic heart disease of native coronary artery without angina pectoris: Secondary | ICD-10-CM

## 2010-10-14 DIAGNOSIS — M109 Gout, unspecified: Secondary | ICD-10-CM

## 2010-10-14 DIAGNOSIS — D369 Benign neoplasm, unspecified site: Secondary | ICD-10-CM

## 2010-10-14 NOTE — Patient Instructions (Signed)
Things looking good today. I will request records from Dr. Dagoberto Ligas. I will review latest blood work as well. [facility] if [facility]'re getting your [facility] wellness visits there, if not then schedule one here at your convenience. Good to meet you.  Call us with questions.

## 2010-10-14 NOTE — Progress Notes (Signed)
Subjective:    Patient ID: RHYTHM WIGFALL, male    DOB: 1940-12-12, 70 y.o.   MRN: [id number]  HPI CC: new pt, establish  Pleasant 70 yo with h/o T2DM on insulin pump, CAD, HTN, HLD, gout (sees Dr. Celene Skeen podiatrist).  Goes to [place] for yearly physical there.  Previously saw Tomi Bamberger.  H/o DM - on insulin pump.  Had vision check within six months.  Sugars run "pretty good".  Last A1c was 7.1%.  H/o gout R first MT last few weeks.  Saw podiatrist.  On HCTZ.  Not on urate lowering meds.  First episode in last 15 years.  Told uric acid level 11.  Preventative: Gets yearly at [place], had first of year 2012. UTD tetanus, pneumonia, shingles, flu yearly.  At [facility]. Colonoscopy 2010. Adenomatous polyps, rec rpt 5 years. ~2015. No family history prostate cancer.  Nocturia x 1, normal stream strength.  No recent prostate check.  Medications and allergies reviewed and updated in chart. Patient Active Problem List  Diagnoses  . DM  . HYPERCHOLESTEROLEMIA  IIA  . HYPERTENSION, UNSPECIFIED  . CAD, NATIVE VESSEL  . SHORTNESS OF BREATH  . HTN (hypertension)  . Dyslipidemia  . Diabetes mellitus  . Ejection fraction  . Pre-syncope  . CAD (coronary artery disease)   Past Medical History  Diagnosis Date  . HTN (hypertension)   . CAD (coronary artery disease)     PCI distal RCA ...2004, residual 70% LAD   /   ...nuclear...03/2007...no ischemia.Marland KitchenMarland Kitchenpreserved LV /  nuclear...03/03/2009...inferior scar..no ischemia..EF 51%  . Dyslipidemia   . T2DM (type 2 diabetes mellitus)   . Adenomatous polyps     colonoscopy 2010  . Internal hemorrhoids   . Pre-syncope     Presyncopal episode while standing in the heat April, 2012  . Ejection fraction     EF 55-60%, echo, December, 2010  . History of chicken pox   . Gout    Past Surgical History  Procedure Date  . Cataract extraction 2010    bilateral  . Elbow surgery 1997  . Balloon angioplasty, artery 1992, 2004    CAD, Dr. Myrtis Ser   History    Substance Use Topics  . Smoking status: Former Games developer  . Smokeless tobacco: Never Used   Comment: remotely  . Alcohol Use: No   Family History  Problem Relation Age of Onset  . Alcohol abuse Father   . Coronary artery disease Neg Hx   . Stroke Neg Hx   . Cancer Neg Hx   . Diabetes Neg Hx    Allergies  Allergen Reactions  . Niacin     REACTION: intolerant,not allergic   Current Outpatient Prescriptions on File Prior to Visit  Medication Sig Dispense Refill  . amLODipine (NORVASC) 10 MG tablet Take 10 mg by mouth daily.        [name] aspirin 81 MG tablet Take 81 mg by mouth daily.        [name] doxazosin (CARDURA) 4 MG tablet Take 2 mg by mouth 2 (two) times daily.       . fosinopril ([name]) 40 MG tablet Take 40 mg by mouth daily.        . furosemide (LASIX) 40 MG tablet Take 40 mg by mouth daily.        . hydrochlorothiazide 25 MG tablet        . insulin aspart (NOVOLOG) 100 UNIT/ML injection In pump       . Insulin Infusion Pump (  H-TRON V100 INSULIN PUMP) MISC by Does not apply route. humalog       . metoprolol (LOPRESSOR) 50 MG tablet Take 50 mg by mouth 2 (two) times daily.        . pantoprazole (PROTONIX) 40 MG tablet Take 40 mg by mouth daily.        . rosuvastatin (CRESTOR) 40 MG tablet Take 20 mg by mouth daily.         Review of Systems  Constitutional: Negative for fever, chills, activity change, appetite change, fatigue and unexpected weight change.  HENT: Negative for hearing loss and neck pain.   Eyes: Negative for visual disturbance.  Respiratory: Negative for cough, chest tightness, shortness of breath and wheezing.   Cardiovascular: Positive for leg swelling. Negative for chest pain and palpitations.  Gastrointestinal: Negative for nausea, vomiting, abdominal pain, diarrhea, constipation, blood in stool and abdominal distention.  Genitourinary: Negative for hematuria and difficulty urinating.  Musculoskeletal: Negative for myalgias and arthralgias.  Skin: Negative  for rash.  Neurological: Negative for dizziness, seizures, syncope and headaches.  Hematological: Does not bruise/bleed easily.  Psychiatric/Behavioral: Negative for dysphoric mood. The patient is not nervous/anxious.        Objective:   Physical Exam  Nursing note and vitals reviewed. Constitutional: He is oriented to person, place, and time. He appears well-developed and well-nourished. No distress.  HENT:  Head: Normocephalic and atraumatic.  Right Ear: External ear normal.  Left Ear: External ear normal.  Nose: Nose normal.  Mouth/Throat: Oropharynx is clear and moist.  Eyes: Conjunctivae and EOM are normal. Pupils are equal, round, and reactive to light.  Neck: Normal range of motion. Neck supple. Carotid bruit is not present. No thyromegaly present.  Cardiovascular: Normal rate, regular rhythm, normal heart sounds and intact distal pulses.   No murmur heard. Pulses:      Radial pulses are 2+ on the right side, and 2+ on the left side.  Pulmonary/Chest: Effort normal and breath sounds normal. No respiratory distress. He has no wheezes. He has no rales.  Abdominal: Soft. Bowel sounds are normal. He exhibits no distension and no mass. There is no tenderness. There is no rebound and no guarding.  Musculoskeletal: Normal range of motion. He exhibits no edema.  Lymphadenopathy:    He has no cervical adenopathy.  Neurological: He is alert and oriented to person, place, and time.       CN grossly intact, station and gait intact  Skin: Skin is warm and dry. No rash noted.  Psychiatric: He has a normal mood and affect. His behavior is normal. Judgment and thought content normal.          Assessment & Plan:

## 2010-10-14 NOTE — Assessment & Plan Note (Signed)
On pump, followed by endo. To establish with Dr. Sharl Ma of [place]

## 2010-10-14 NOTE — Assessment & Plan Note (Signed)
Well controlled on current regimen, neg ROS. Continue for now.

## 2010-10-14 NOTE — Assessment & Plan Note (Signed)
S/p balloon angioplasty x2. Currently asxs.

## 2010-10-14 NOTE — Assessment & Plan Note (Signed)
Request records, check uric acid next blood work. If 2nd episode, stop HCTZ and start urate lowering med.

## 2010-10-14 NOTE — Assessment & Plan Note (Signed)
No recent check in chart but pt states had 04/2010.  Will request records from Dr. Dagoberto Ligas.

## 2010-10-14 NOTE — Assessment & Plan Note (Signed)
Due for rpt colonoscopy ~2015, states they will contact him.

## 2010-10-31 ENCOUNTER — Encounter: Payer: Self-pay | Admitting: Family Medicine

## 2010-11-02 ENCOUNTER — Encounter: Payer: Self-pay | Admitting: Family Medicine

## 2010-11-11 ENCOUNTER — Encounter: Payer: Self-pay | Admitting: Family Medicine

## 2010-11-11 ENCOUNTER — Ambulatory Visit (INDEPENDENT_AMBULATORY_CARE_PROVIDER_SITE_OTHER): Payer: Medicare Other | Admitting: Family Medicine

## 2010-11-11 VITALS — BP 114/68 | HR 68 | Temp 98.3°F | Wt 199.0 lb

## 2010-11-11 DIAGNOSIS — R1013 Epigastric pain: Secondary | ICD-10-CM

## 2010-11-11 LAB — CBC WITH DIFFERENTIAL/PLATELET
Basophils Absolute: 0 10*3/uL (ref 0.0–0.1)
Basophils Relative: 0.7 % (ref 0.0–3.0)
Eosinophils Absolute: 0.1 10*3/uL (ref 0.0–0.7)
Lymphocytes Relative: 25.5 % (ref 12.0–46.0)
MCHC: 34 g/dL (ref 30.0–36.0)
MCV: 88.2 fl (ref 78.0–100.0)
Monocytes Absolute: 0.4 10*3/uL (ref 0.1–1.0)
Neutro Abs: 3.9 10*3/uL (ref 1.4–7.7)
Neutrophils Relative %: 64.7 % (ref 43.0–77.0)
RDW: 13.2 % (ref 11.5–14.6)

## 2010-11-11 LAB — COMPREHENSIVE METABOLIC PANEL
ALT: 34 U/L (ref 0–53)
AST: 28 U/L (ref 0–37)
Albumin: 4.3 g/dL (ref 3.5–5.2)
Alkaline Phosphatase: 72 U/L (ref 39–117)
Calcium: 9.3 mg/dL (ref 8.4–10.5)
Chloride: 103 mEq/L (ref 96–112)
Potassium: 3.5 mEq/L (ref 3.5–5.1)
Sodium: 140 mEq/L (ref 135–145)

## 2010-11-11 NOTE — Patient Instructions (Addendum)
Blood work today to check on kidneys, liver, pancreas. Let's try stronger acid medicine (nexium). Make sure you drink plenty of water with increase in fiber in diet recently. Let usk now if not improving, may set you up with stomach doctor.

## 2010-11-11 NOTE — Progress Notes (Signed)
Subjective:    Patient ID: Jeffrey [name], male    DOB: January 11, 1941, 70 y.o.   MRN: [id number]  HPI CC: abd pain  1 1/2 wk h/o upper abdominal pain, felt sore and tender under sternum.  Localized to epigastric region.  Reproducible with palpation.  Felt some bloating as well as right upper quadrant nagging pain.  Pain worse with any food, worse with position changes.  Some increase in gassiness.  Mild queasiness.  No radiation of pain.  Denies fevers/chills, vomiting, stool changes, blood in stool, diarrhea, constipation, coughing, urinary changes or dysuria.  No EtOH, no recent NSAID use.  After weekend got better, pain improving on its own.  Has recently increased vegetable and fruit intake recently, less red meats.  Doesn't think any change in food precipitated sxs but does endorse sometimes worsening after meal.  On protonix per ENT for possible occult reflux (has been on since early 2012).  Sent to ENT for hoarse voice, w/u unrevealing.  Wt Readings from Last 3 Encounters:  11/11/10 199 lb (90.266 kg)  10/14/10 197 lb 12 oz (89.699 kg)  07/02/10 198 lb (89.812 kg)   T2DM - states sugars running ok.  This am 200s.  Colonoscopy 07/2007. Adenomatous polyps, rec rpt 5 years. 2014.  Medications and allergies reviewed and updated in chart.  Past histories reviewed and updated if relevant as below. Patient Active Problem List  Diagnoses  . HTN (hypertension)  . Dyslipidemia  . Diabetes mellitus  . Ejection fraction  . CAD (coronary artery disease)  . Gout  . Adenomatous polyps  . Epigastric abdominal pain   Past Medical History  Diagnosis Date  . HTN (hypertension)   . CAD (coronary artery disease)     PCI distal RCA ...2004, residual 70% LAD   /   ...nuclear...03/2007...no ischemia.Marland KitchenMarland Kitchenpreserved LV /  nuclear...03/03/2009...inferior scar..no ischemia..EF 51%  . Dyslipidemia   . T2DM (type 2 diabetes mellitus) 1989  . Adenomatous polyps     colonoscopy 2010  . Internal  hemorrhoids   . Pre-syncope     Presyncopal episode while standing in the heat April, 2012  . Ejection fraction     EF 55-60%, echo, December, 2010  . History of chicken pox   . Gout   . GERD (gastroesophageal reflux disease)    Past Surgical History  Procedure Date  . Cataract extraction 2010    bilateral  . Elbow surgery 1997  . Balloon angioplasty, artery 1992, 2004    CAD, Dr. Myrtis Ser  . Stress myoview 2009    abnl, preserved EF, inferior scar and small apical ischemia  . Colonoscopy 04/03/2002    adenomatous polyp, int hemorrhoids  . Colonoscopy 07/18/2007    normal   History  Substance Use Topics  . Smoking status: Former Games developer  . Smokeless tobacco: Never Used   Comment: remotely  . Alcohol Use: No   Family History  Problem Relation Age of Onset  . Alcohol abuse Father   . Coronary artery disease Neg Hx   . Stroke Neg Hx   . Cancer Neg Hx   . Diabetes Neg Hx    Allergies  Allergen Reactions  . Niacin     REACTION: intolerant,not allergic   Current Outpatient Prescriptions on File Prior to Visit  Medication Sig Dispense Refill  . amLODipine (NORVASC) 10 MG tablet Take 10 mg by mouth daily.        [name] aspirin 81 MG tablet Take 81 mg by mouth daily.        [name]  doxazosin (CARDURA) 4 MG tablet Take 2 mg by mouth 2 (two) times daily.       . fosinopril ([name]) 40 MG tablet Take 40 mg by mouth daily.        . furosemide (LASIX) 40 MG tablet Take 40 mg by mouth daily.        . hydrochlorothiazide 25 MG tablet        . insulin aspart (NOVOLOG) 100 UNIT/ML injection In pump       . Insulin Infusion Pump (H-TRON V100 INSULIN PUMP) MISC by Does not apply route. humalog       . metoprolol (LOPRESSOR) 50 MG tablet Take 50 mg by mouth 2 (two) times daily.        . pantoprazole (PROTONIX) 40 MG tablet Take 40 mg by mouth daily.        . rosuvastatin (CRESTOR) 40 MG tablet Take 20 mg by mouth daily.         Review of Systems Per HPI    Objective:   Physical Exam  Nursing  note and vitals reviewed. Constitutional: He appears well-developed and well-nourished. No distress.  HENT:  Head: Normocephalic and atraumatic.  Mouth/Throat: Oropharynx is clear and moist. No oropharyngeal exudate.  Eyes: Conjunctivae and EOM are normal. Pupils are equal, round, and reactive to light.  Neck: Normal range of motion. Neck supple.  Cardiovascular: Normal rate, regular rhythm, normal heart sounds and intact distal pulses.   No murmur heard. Pulmonary/Chest: Effort normal and breath sounds normal. No respiratory distress. He has no wheezes. He has no rales.  Abdominal: Soft. Bowel sounds are normal. He exhibits no distension and no mass. There is no hepatosplenomegaly. There is tenderness in the right lower quadrant, epigastric area and left lower quadrant. There is no rigidity, no rebound, no guarding, no CVA tenderness and negative Murphy's sign. A hernia is present. Hernia confirmed positive in the ventral area.  Musculoskeletal: He exhibits no edema.  Skin: Skin is warm and dry. No rash noted.  Psychiatric: He has a normal mood and affect.          Assessment & Plan:

## 2010-11-11 NOTE — Assessment & Plan Note (Addendum)
Unclear etiology, ?dyspepsia from ulcer.  Increase PPI to nexium daily for 2 wks (samples provided). Check blood work to eval other causes. If not improved, to notify us, would refer to GI for eval and possible EGD.

## 2010-12-14 ENCOUNTER — Ambulatory Visit: Payer: Medicare Other | Admitting: Cardiology

## 2011-01-03 ENCOUNTER — Ambulatory Visit (INDEPENDENT_AMBULATORY_CARE_PROVIDER_SITE_OTHER): Payer: Medicare Other | Admitting: Cardiology

## 2011-01-03 ENCOUNTER — Encounter: Payer: Self-pay | Admitting: Cardiology

## 2011-01-03 DIAGNOSIS — E119 Type 2 diabetes mellitus without complications: Secondary | ICD-10-CM

## 2011-01-03 DIAGNOSIS — I10 Essential (primary) hypertension: Secondary | ICD-10-CM

## 2011-01-03 DIAGNOSIS — I2510 Atherosclerotic heart disease of native coronary artery without angina pectoris: Secondary | ICD-10-CM

## 2011-01-03 NOTE — Assessment & Plan Note (Signed)
Blood pressure is controlled.  No change in therapy.  Followup in one year.

## 2011-01-03 NOTE — Progress Notes (Signed)
HPI [name] is seen in followup coronary disease.  He is stable.  I saw him last April, 2012.  He had some mild presyncope.  His workup was negative and he has not had any major recurring problems.  He's not having any chest pain or shortness of breath.  He is doing well. Allergies  Allergen Reactions  . Niacin     REACTION: intolerant,not allergic    Current Outpatient Prescriptions  Medication Sig Dispense Refill  . amLODipine (NORVASC) 10 MG tablet Take 10 mg by mouth daily.        [name] aspirin 81 MG tablet Take 81 mg by mouth daily.        [name] doxazosin (CARDURA) 4 MG tablet Take 2 mg by mouth 2 (two) times daily.       . fosinopril ([name]) 40 MG tablet Take 40 mg by mouth daily.        . furosemide (LASIX) 40 MG tablet Take 40 mg by mouth daily.        . hydrochlorothiazide 25 MG tablet        . insulin aspart (NOVOLOG) 100 UNIT/ML injection In pump       . Insulin Infusion Pump (H-TRON V100 INSULIN PUMP) MISC by Does not apply route. humalog       . metoprolol (LOPRESSOR) 50 MG tablet Take 50 mg by mouth 2 (two) times daily.        . pantoprazole (PROTONIX) 40 MG tablet Take 40 mg by mouth daily.        . rosuvastatin (CRESTOR) 40 MG tablet Take 20 mg by mouth daily.          History   Social History  . Marital Status: Married    Spouse Name: N/A    Number of Children: 2  . Years of Education: N/A   Occupational History  . [employer]    Social History Main Topics  . Smoking status: Former Games developer  . Smokeless tobacco: Never Used   Comment: remotely  . Alcohol Use: No  . Drug Use: No  . Sexually Active: Not on file   Other Topics Concern  . 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 Alcohol abuse Father   . Coronary  artery disease Neg Hx   . Stroke Neg Hx   . Cancer Neg Hx   . Diabetes Neg Hx     Past Medical History  Diagnosis Date  . HTN (hypertension)   . CAD (coronary artery disease)     PCI distal RCA ...2004, residual 70% LAD   /   ...nuclear...03/2007...no ischemia.Marland KitchenMarland Kitchenpreserved LV /  nuclear...03/03/2009...inferior scar..no ischemia..EF 51%  . Dyslipidemia   . T2DM (type 2 diabetes mellitus) 1989  . Adenomatous polyps     colonoscopy 2010  . Internal hemorrhoids   . Pre-syncope     Presyncopal episode while standing in the heat April, 2012  . Ejection fraction     EF 55-60%, echo, December, 2010  . History of chicken pox   . Gout   . GERD (gastroesophageal reflux disease)     Past Surgical History  Procedure Date  . Cataract extraction 2010    bilateral  . Elbow surgery 1997  . Balloon angioplasty, artery 1992, 2004  CAD, Dr. Myrtis Ser  . Stress myoview 2009    abnl, preserved EF, inferior scar and small apical ischemia  . Colonoscopy 04/03/2002    adenomatous polyp, int hemorrhoids  . Colonoscopy 07/18/2007    normal    ROS  Patient denies fever, chills, headache, sweats, rash, change in vision, change in hearing, chest pain, cough, nausea vomiting, urinary symptoms.  All of the systems are reviewed and are negative. PHYSICAL EXAM Patient looks quite good.  Head is atraumatic.  There is no jugular venous distention.  Lungs are clear.  Respiratory effort is not labored.  Cardiac exam reveals an S1-S2.  No clicks or significant murmurs.  The abdomen is soft.  There is no peripheral edema. Filed Vitals:   01/03/11 1022  BP: 130/76  Pulse: 61  Resp: 18  Height: 5\' 8"  (1.727 m)  Weight: 197 lb 12.8 oz (89.721 kg)  SpO2: 96%   EKG is done today and reviewed by me.  There is normal sinus rhythm.  There is no change.  ASSESSMENT & PLAN

## 2011-01-03 NOTE — Patient Instructions (Signed)
Your physician wants you to follow-up in:  12 months.  You will receive a reminder letter in the mail two months in advance. If you don't receive a letter, please call our office to schedule the follow-up appointment.   

## 2011-01-03 NOTE — Assessment & Plan Note (Signed)
He will be following up soon with Dr.Kerr.

## 2011-01-03 NOTE — Assessment & Plan Note (Signed)
Coronary disease is stable. No change in therapy. 

## 2011-01-04 DIAGNOSIS — R490 Dysphonia: Secondary | ICD-10-CM | POA: Insufficient documentation

## 2011-02-01 LAB — BASIC METABOLIC PANEL
Glucose: 254
Glucose: 254
Potassium: 3.6 mmol/L
Potassium: 3.6 mmol/L
Sodium: 138 mmol/L (ref 137–147)
Sodium: 138 mmol/L (ref 137–147)

## 2011-02-01 LAB — LIPID PANEL
Cholesterol, Total: 211
Cholesterol: 211 mg/dL — AB (ref 0–200)
Direct LDL: 83
Direct LDL: 83
Triglycerides: 522
Triglycerides: 522

## 2011-02-01 LAB — TSH: TSH: 0.9

## 2011-02-06 ENCOUNTER — Encounter: Payer: Self-pay | Admitting: Family Medicine

## 2011-02-09 ENCOUNTER — Encounter: Payer: Self-pay | Admitting: Family Medicine

## 2011-02-16 ENCOUNTER — Encounter: Payer: Self-pay | Admitting: Family Medicine

## 2011-04-11 ENCOUNTER — Encounter: Payer: Self-pay | Admitting: Family Medicine

## 2011-04-21 ENCOUNTER — Telehealth: Payer: Self-pay | Admitting: Family Medicine

## 2011-04-21 MED ORDER — COLCHICINE 0.6 MG PO TABS: 0.6000 mg | ORAL_TABLET | ORAL | Status: DC | PRN

## 2011-04-21 NOTE — Telephone Encounter (Signed)
Patient notified and will call for appt if having more frequent flares.

## 2011-04-21 NOTE — Telephone Encounter (Signed)
Sent in.  If more frequent flares, would like him to come in to discuss change in meds.

## 2011-04-21 NOTE — Telephone Encounter (Signed)
Pt was given a Rx for Colcrys 0.6mg  for Gout, originally written by Dr/ Leeanne Deed, at [facility]. Pt says he was told if he need more to contact he PCP.Marland KitchenMarland Kitchen

## 2011-05-26 ENCOUNTER — Other Ambulatory Visit: Payer: Self-pay

## 2011-05-26 ENCOUNTER — Emergency Department (HOSPITAL_COMMUNITY): Payer: Medicare Other

## 2011-05-26 ENCOUNTER — Encounter (HOSPITAL_COMMUNITY): Payer: Self-pay | Admitting: Emergency Medicine

## 2011-05-26 ENCOUNTER — Emergency Department (HOSPITAL_COMMUNITY)
Admission: EM | Admit: 2011-05-26 | Discharge: 2011-05-27 | Disposition: A | Discharge status: Home / Self Care | Payer: Medicare Other | Attending: Emergency Medicine | Admitting: Emergency Medicine

## 2011-05-26 DIAGNOSIS — M791 Myalgia, unspecified site: Secondary | ICD-10-CM

## 2011-05-26 DIAGNOSIS — Z79899 Other long term (current) drug therapy: Secondary | ICD-10-CM | POA: Insufficient documentation

## 2011-05-26 DIAGNOSIS — E119 Type 2 diabetes mellitus without complications: Secondary | ICD-10-CM | POA: Insufficient documentation

## 2011-05-26 DIAGNOSIS — IMO0001 Reserved for inherently not codable concepts without codable children: Secondary | ICD-10-CM | POA: Insufficient documentation

## 2011-05-26 DIAGNOSIS — Z7982 Long term (current) use of aspirin: Secondary | ICD-10-CM | POA: Insufficient documentation

## 2011-05-26 DIAGNOSIS — M25476 Effusion, unspecified foot: Secondary | ICD-10-CM | POA: Insufficient documentation

## 2011-05-26 DIAGNOSIS — E785 Hyperlipidemia, unspecified: Secondary | ICD-10-CM | POA: Insufficient documentation

## 2011-05-26 DIAGNOSIS — R262 Difficulty in walking, not elsewhere classified: Secondary | ICD-10-CM | POA: Insufficient documentation

## 2011-05-26 DIAGNOSIS — M25579 Pain in unspecified ankle and joints of unspecified foot: Secondary | ICD-10-CM | POA: Insufficient documentation

## 2011-05-26 DIAGNOSIS — Z8639 Personal history of other endocrine, nutritional and metabolic disease: Secondary | ICD-10-CM | POA: Insufficient documentation

## 2011-05-26 DIAGNOSIS — M25473 Effusion, unspecified ankle: Secondary | ICD-10-CM | POA: Insufficient documentation

## 2011-05-26 DIAGNOSIS — Z862 Personal history of diseases of the blood and blood-forming organs and certain disorders involving the immune mechanism: Secondary | ICD-10-CM | POA: Insufficient documentation

## 2011-05-26 DIAGNOSIS — R071 Chest pain on breathing: Secondary | ICD-10-CM | POA: Insufficient documentation

## 2011-05-26 DIAGNOSIS — I2510 Atherosclerotic heart disease of native coronary artery without angina pectoris: Secondary | ICD-10-CM | POA: Insufficient documentation

## 2011-05-26 DIAGNOSIS — I10 Essential (primary) hypertension: Secondary | ICD-10-CM | POA: Insufficient documentation

## 2011-05-26 DIAGNOSIS — W19XXXA Unspecified fall, initial encounter: Secondary | ICD-10-CM | POA: Insufficient documentation

## 2011-05-26 DIAGNOSIS — M25519 Pain in unspecified shoulder: Secondary | ICD-10-CM | POA: Insufficient documentation

## 2011-05-26 DIAGNOSIS — K219 Gastro-esophageal reflux disease without esophagitis: Secondary | ICD-10-CM | POA: Insufficient documentation

## 2011-05-26 NOTE — ED Notes (Signed)
Called x1. No answer.

## 2011-05-26 NOTE — ED Notes (Signed)
PT. TRIPPED , TWISTED LEFT ANKLE AND  FELL ON THE GROUND THIS AFTERNOON , NO LOC , ALSO REPORTS PAIN AT LEFT SHOULDER AND LEFT ANTERIOR RIBCAGE WORSE WITH DEEP INSPIRATION AND CERTAIN POSITIONS.

## 2011-05-26 NOTE — ED Notes (Signed)
A/O x 4.  Walking with a slight limp.  Updated on POC.  Vitals stable.  No distress noted.

## 2011-05-27 MED ORDER — NAPROXEN 375 MG PO TABS: 375.0000 mg | ORAL_TABLET | Freq: Two times a day (BID) | ORAL | Status: DC

## 2011-05-27 MED ORDER — DIAZEPAM 5 MG PO TABS
5.0000 mg | ORAL_TABLET | Freq: Once | ORAL | Status: AC
  Administered 2011-05-27: 5 mg via ORAL
  Filled 2011-05-27: qty 1

## 2011-05-27 MED ORDER — IBUPROFEN 200 MG PO TABS
600.0000 mg | ORAL_TABLET | Freq: Once | ORAL | Status: AC
  Administered 2011-05-27: 600 mg via ORAL
  Filled 2011-05-27: qty 1

## 2011-05-27 NOTE — ED Notes (Signed)
Updated pt on delay.  No distress noted.

## 2011-05-27 NOTE — ED Provider Notes (Signed)
History     CSN: [id number]  Arrival date & time 05/26/11  1751   First MD Initiated Contact with Patient 05/26/11 2340      Chief Complaint  Patient presents with  . Fall    (Consider location/radiation/quality/duration/timing/severity/associated sxs/prior treatment) HPI Comments: Patient presents emergency Department with chief complaint of mechanical fall.  Patient states that the injury occurred earlier today sometime in the afternoon.  Associated symptoms include left ankle pain without difficulty ambulating, left anterior rib cage at pain and left shoulder pain.  Symptoms are exacerbated by motion and deep inspiration.  Patient states the fall was witnessed, he did not hit his head at all, there was no loss of consciousness, he denies vision changes headaches nausea and vomiting.  Patient's wife is with him in room and states she witnessed fall.  Patient has no other complaints at this time.  Patient is a 71 y.o. male presenting with fall. The history is provided by the patient.  Fall Pertinent negatives include no fever, no numbness, no abdominal pain and no headaches.    Past Medical History  Diagnosis Date  . HTN (hypertension)   . CAD (coronary artery disease)     PCI distal RCA ...2004, residual 70% LAD   /   ...nuclear...03/2007...no ischemia.Marland KitchenMarland Kitchenpreserved LV /  nuclear...03/03/2009...inferior scar..no ischemia..EF 51%  . Dyslipidemia   . T2DM (type 2 diabetes mellitus) 1989    insulin pump by Dr. Sharl Ma  . Adenomatous polyps     colonoscopy 2010  . Internal hemorrhoids   . Pre-syncope     Presyncopal episode while standing in the heat April, 2012  . Ejection fraction     EF 55-60%, echo, December, 2010  . History of chicken pox   . Gout   . GERD (gastroesophageal reflux disease)     Past Surgical History  Procedure Date  . Cataract extraction 2010    bilateral  . Elbow surgery 1997  . Balloon angioplasty, artery 1992, 2004    CAD, Dr. Myrtis Ser  . Stress myoview  2009    abnl, preserved EF, inferior scar and small apical ischemia  . Colonoscopy 04/03/2002    adenomatous polyp, int hemorrhoids  . Colonoscopy 07/18/2007    normal    Family History  Problem Relation Age of Onset  . Alcohol abuse Father   . Coronary artery disease Neg Hx   . Stroke Neg Hx   . Cancer Neg Hx   . Diabetes Neg Hx     History  Substance Use Topics  . Smoking status: Former Games developer  . Smokeless tobacco: Never Used   Comment: remotely  . Alcohol Use: No      Review of Systems  Constitutional: Negative for fever, chills and appetite change.  HENT: Negative for congestion, neck pain and neck stiffness.   Eyes: Negative for visual disturbance.  Respiratory: Negative for shortness of breath.   Cardiovascular: Negative for chest pain and leg swelling.  Gastrointestinal: Negative for abdominal pain.  Genitourinary: Negative for dysuria, urgency and frequency.  Musculoskeletal: Positive for myalgias and arthralgias. Negative for back pain and gait problem.  Neurological: Negative for dizziness, syncope, weakness, light-headedness, numbness and headaches.  Psychiatric/Behavioral: Negative for confusion.  All other systems reviewed and are negative.    Allergies  Niacin  Home Medications   Current Outpatient Rx  Name Route Sig Dispense Refill  . AMLODIPINE BESYLATE 10 MG PO TABS Oral Take 10 mg by mouth daily.      . ASPIRIN  81 MG PO TABS Oral Take 81 mg by mouth daily.      [name] DOXAZOSIN MESYLATE 4 MG PO TABS Oral Take 2 mg by mouth 2 (two) times daily.     . FOSINOPRIL SODIUM 40 MG PO TABS Oral Take 40 mg by mouth daily.      . FUROSEMIDE 40 MG PO TABS Oral Take 40 mg by mouth daily.     [name] HYDROCHLOROTHIAZIDE 25 MG PO TABS Oral Take 12.5 mg by mouth daily.     . INSULIN ASPART 100 UNIT/ML [place] SOLN  In pump    . METOPROLOL TARTRATE 50 MG PO TABS Oral Take 50 mg by mouth 2 (two) times daily.      [name] ROSUVASTATIN CALCIUM 40 MG PO TABS Oral Take 40 mg by mouth  daily.     . COLCHICINE 0.6 MG PO TABS Oral Take 0.6 mg by mouth as needed. May take 2 for gout flare followed by one twice daily until flare subsided    . H-TRON V100 INSULIN PUMP MISC Does not apply by Does not apply route. humalog       BP 155/75  Pulse 66  Temp(Src) 97.7 F (36.5 C) (Oral)  Resp 18  SpO2 97%  Physical Exam  Nursing note and vitals reviewed. Constitutional: He is oriented to person, place, and time. He appears well-developed and well-nourished. No distress.  HENT:  Head: Normocephalic and atraumatic.  Eyes: Conjunctivae and EOM are normal. Pupils are equal, round, and reactive to light. No scleral icterus.  Neck: Normal range of motion and full passive range of motion without pain. Neck supple. No JVD present. Carotid bruit is not present. No rigidity. No Brudzinski's sign noted.  Cardiovascular: Normal rate, regular rhythm, normal heart sounds and intact distal pulses.   Pulmonary/Chest: Effort normal and breath sounds normal. No respiratory distress. He has no wheezes. He has no rales.  Musculoskeletal: Normal range of motion.       Full painfree passive ROM of left shoulder, but mild pain w active ROM. NO bony ttp of left shoulder or ankle including medial and lateral malleolus. No swelling or erythema. Distal pulses intact. Mild ttp over anterior left rip, no sternal tenderness  Lymphadenopathy:    He has no cervical adenopathy.  Neurological: He is alert and oriented to person, place, and time. He has normal strength. No cranial nerve deficit or sensory deficit. He displays a negative Romberg sign. Coordination and gait normal. GCS eye subscore is 4. GCS verbal subscore is 5. GCS motor subscore is 6.       A&O x3.  Able to follow commands. PERRL, EOMs, no vertical or bidirectional nystagmus. Shoulder shrug, facial muscles, tongue protrusion and swallow intact. No pass pointing.  Motor strength 5/5 bilaterally including grip strength, triceps, hamstrings and ankle  dorsiflexion.   Intact finger to nose, shin to heel and rapid alternating movements. No ataxia or dysequilibrium.   Skin: Skin is warm and dry. No rash noted. He is not diaphoretic.  Psychiatric: He has a normal mood and affect. His behavior is normal.    ED Course  Procedures (including critical care time)  Labs Reviewed - No data to display Dg Ribs Unilateral W/chest Left  05/26/2011  *RADIOLOGY REPORT*  Clinical Data: Left shoulder and chest/rib pain post fall  LEFT RIBS AND CHEST - 3+ VIEW  Comparison: Chest radiograph 02/11/2009  Findings: Normal heart size, mediastinal contours, and pulmonary vascularity. Lungs clear. No pleural effusion or pneumothorax. Bones  appear slightly demineralized. BB placed at site of symptoms anterior lower left chest. No rib fracture or bone destruction identified.  IMPRESSION: No radiographic evidence of acute injury.  Original Report Authenticated By: Lollie Marrow, M.D.   Dg Ankle Complete Left  05/26/2011  *RADIOLOGY REPORT*  Clinical Data: Twist injury and fall, left ankle pain and swelling  LEFT ANKLE COMPLETE - 3+ VIEW  Comparison: None  Findings: Soft tissue swelling, greatest anteriorly and laterally. Ankle mortise intact. Minimal calcaneal spurring. No acute fracture, dislocation, or bone destruction. Minimal scattered small vessel vascular calcification.  IMPRESSION: No acute osseous abnormalities.  Original Report Authenticated By: Lollie Marrow, M.D.   Dg Shoulder Left  05/26/2011  *RADIOLOGY REPORT*  Clinical Data: Left shoulder and chest pain post fall  LEFT SHOULDER - 2+ VIEW  Comparison: None  Findings: Osseous demineralization. AC joint alignment normal. No acute fracture, dislocation, or bone destruction. Visualized left ribs intact.  IMPRESSION: No radiographic evidence of acute injury.  Original Report Authenticated By: Lollie Marrow, M.D.     No diagnosis found.    MDM  Fall, muscular pain  Pt w witnessed fall, did not hit head or  have LOC, and has no focal neuro deficits. CT head not indicated at this time. Discussed reasons for prompt return to ED. Patient X-Ray negative for obvious fracture or dislocation. Pain managed in ED. Pt advised to follow up with orthopedics if symptoms persist for possibility of missed fracture diagnosis. Patient abble to ambulate without difficulty w extremity ROM and strength intact, no brace or crutches needed. conservative therapy recommended and discussed. Patient will be dc home & is agreeable with above plan. Pt given valium & NSAID in ED, discussed fall precautions.          Jaci Carrel, [place] 05/27/11 [phone]

## 2011-06-01 ENCOUNTER — Ambulatory Visit (INDEPENDENT_AMBULATORY_CARE_PROVIDER_SITE_OTHER): Payer: Medicare Other | Admitting: Family Medicine

## 2011-06-01 ENCOUNTER — Encounter: Payer: Self-pay | Admitting: Family Medicine

## 2011-06-01 ENCOUNTER — Telehealth: Payer: Self-pay | Admitting: Family Medicine

## 2011-06-01 VITALS — BP 120/70 | HR 60 | Temp 98.0°F | Ht 68.0 in | Wt 201.8 lb

## 2011-06-01 DIAGNOSIS — R071 Chest pain on breathing: Secondary | ICD-10-CM

## 2011-06-01 DIAGNOSIS — R0789 Other chest pain: Secondary | ICD-10-CM | POA: Insufficient documentation

## 2011-06-01 NOTE — Telephone Encounter (Addendum)
Noted. Thanks.  ER records in chart.

## 2011-06-01 NOTE — Telephone Encounter (Signed)
Caller: Maksym/Patient; PCP: Eustaquio Boyden; CB#: [phone]; ; ; Call regarding Chest Pain/Chest Discomfort; states [place] the concrete [place] and turned his ankle a week ago, falling onto his L arm and shoulder.  States he felt "something rip, like a heart punch."  Seen in ED and after a 6-hour wait, all xrays of anke and ribs were negative.  Overnight 05/31/11 he felt a pulled muscle feeling to the L shoulder, over L breast, and L ankle.  Discomfort did interrupt his sleep.  There is a specific point of tenderness to touch toward the L armpit.  Denies SOB, radiating chest pain, or other cardiac symptoms.  Per protocol, advised being seen within 4 hours; prefers to be seen after 3pm in office.  Appt sched 1530 06/01/11 with Dr. Milinda Antis.

## 2011-06-01 NOTE — Patient Instructions (Signed)
I think your chest wall pain is from strain from your injury I recommend a warm compress to chest when you need it and try to stretch out  Pain medicine is ok when you need it  Tylenol is ok as directed   If you become short of breath or have a cough or fever  Try to take a good deep breath once an hour  If not improving in 2 weeks - alert [place]

## 2011-06-01 NOTE — Assessment & Plan Note (Signed)
After injury with reassuring exam, sore ribs on L  Rev xrays from ER  Disc tx - warm compresses/ tylenol and the naproxen with caution  Disc risk of atelectasis/ pneumonia - given red flags to watch for  Update if not starting to improve in a week or if worsening

## 2011-06-01 NOTE — Progress Notes (Signed)
Subjective:    Patient ID: Jeffrey [name], male    DOB: [birth year], 71 y.o.   MRN: [id number]  HPI Here for f/u after a fall onto his L side  This happened about a week ago    Rolled his ankle over a concrete curb - and fell onto the L shoulder (thinks he pulled some muscles) Also hit ribs on L side -- very deep pain  He has cracked ribs in the past -does not feel like that really Went to the ER -- had EKG - was ok and also xray of ankle and shoulder and chest , and ribs No fractures  Was reassured by that   Quite sore over week  Last night - more pain in L shoulder/ ant chest area (may have overdone it )  Makes him nervous - has a hx of heart problems  It does hurt to take a deep breath   Ankle is a bit bruised and overall feeling better   Patient Active Problem List  Diagnoses  . HTN (hypertension)  . Dyslipidemia  . Type 2 diabetes, uncontrolled, with neuropathy  . Ejection fraction  . CAD (coronary artery disease)  . Gout  . Adenomatous polyps  . Epigastric abdominal pain  . Acute chest wall pain   Past Medical History  Diagnosis Date  . HTN (hypertension)   . CAD (coronary artery disease)     PCI distal RCA ...2004, residual 70% LAD   /   ...nuclear...03/2007...no ischemia.Marland KitchenMarland Kitchenpreserved LV /  nuclear...03/03/2009...inferior scar..no ischemia..EF 51%  . Dyslipidemia   . T2DM (type 2 diabetes mellitus) 1989    insulin pump by Dr. Sharl Ma  . Adenomatous polyps     colonoscopy 2010  . Internal hemorrhoids   . Pre-syncope     Presyncopal episode while standing in the heat April, 2012  . Ejection fraction     EF 55-60%, echo, December, 2010  . History of chicken pox   . Gout   . GERD (gastroesophageal reflux disease)    Past Surgical History  Procedure Date  . Cataract extraction 2010    bilateral  . Elbow surgery 1997  . Balloon angioplasty, artery 1992, 2004    CAD, Dr. Myrtis Ser  . Stress myoview 2009    abnl, preserved EF, inferior scar and small apical  ischemia  . Colonoscopy 04/03/2002    adenomatous polyp, int hemorrhoids  . Colonoscopy 07/18/2007    normal   History  Substance Use Topics  . Smoking status: Former Games developer  . Smokeless tobacco: Never Used   Comment: remotely  . Alcohol Use: No   Family History  Problem Relation Age of Onset  . Alcohol abuse Father   . Coronary artery disease Neg Hx   . Stroke Neg Hx   . Cancer Neg Hx   . Diabetes Neg Hx    Allergies  Allergen Reactions  . Niacin     REACTION: intolerant,not allergic   Current Outpatient Prescriptions on File Prior to Visit  Medication Sig Dispense Refill  . amLODipine (NORVASC) 10 MG tablet Take 10 mg by mouth daily.        [name] aspirin 81 MG tablet Take 81 mg by mouth daily.        [name] doxazosin (CARDURA) 4 MG tablet Take 2 mg by mouth 2 (two) times daily.       . fosinopril ([name]) 40 MG tablet Take 40 mg by mouth daily.        . furosemide (LASIX)  40 MG tablet Take 40 mg by mouth daily.       . hydrochlorothiazide 25 MG tablet Take 12.5 mg by mouth daily.       . insulin aspart (NOVOLOG) 100 UNIT/ML injection In pump      . Insulin Infusion Pump (H-TRON V100 INSULIN PUMP) MISC by Does not apply route. humalog       . metoprolol (LOPRESSOR) 50 MG tablet Take 50 mg by mouth 2 (two) times daily.        . naproxen (NAPROSYN) 375 MG tablet Take 1 tablet (375 mg total) by mouth 2 (two) times daily.  20 tablet  0  . rosuvastatin (CRESTOR) 40 MG tablet Take 40 mg by mouth daily.       . colchicine 0.6 MG tablet Take 0.6 mg by mouth as needed. May take 2 for gout flare followed by one twice daily until flare subsided      . DISCONTD: pantoprazole (PROTONIX) 40 MG tablet Take 40 mg by mouth daily.             Review of Systems Review of Systems  Constitutional: Negative for fever, appetite change, fatigue and unexpected weight change.  Eyes: Negative for pain and visual disturbance.  Respiratory: Negative for cough and shortness of breath.   Cardiovascular:  Negative for cp or palpitations    Gastrointestinal: Negative for nausea, diarrhea and constipation.  Genitourinary: Negative for urgency and frequency.  Skin: Negative for pallor or rash   Neurological: Negative for weakness, light-headedness, numbness and headaches.  Hematological: Negative for adenopathy. Does not bruise/bleed easily.  Psychiatric/Behavioral: Negative for dysphoric mood. The patient is not nervous/anxious.          Objective:   Physical Exam  Constitutional: He appears well-developed and well-nourished. No distress.  HENT:  Head: Normocephalic and atraumatic.  Mouth/Throat: Oropharynx is clear and moist.  Eyes: Conjunctivae and EOM are normal. Pupils are equal, round, and reactive to light.  Neck: Neck supple. No JVD present.       Nl rom neck without bony tenderness or crepitus  Cardiovascular: Normal rate and regular rhythm.   Pulmonary/Chest: Effort normal and breath sounds normal. No respiratory distress. He has no wheezes. He has no rales. He exhibits tenderness.       Mild to moderate L anterolateral chest wall tenderness  No crepitus or skin change No change in bs in this area    Abdominal: Soft. Bowel sounds are normal. He exhibits no distension. There is no tenderness.  Musculoskeletal: He exhibits tenderness. He exhibits no edema.       Nl rom L shoulder and upper ext Neg hawkings/ neer tests  No joint swelling or ecchymosis  Lymphadenopathy:    He has no cervical adenopathy.  Neurological: He is alert. He has normal reflexes. No cranial nerve deficit. He exhibits normal muscle tone. Coordination normal.  Skin: Skin is warm and dry. No rash noted. No erythema. No pallor.  Psychiatric: He has a normal mood and affect.          Assessment & Plan:

## 2011-06-12 NOTE — ED Provider Notes (Signed)
Evaluation and management procedures were performed by the PA/NP/Resident Physician under my supervision/collaboration.   Felisa Bonier, MD 06/12/11 [phone]

## 2011-07-10 ENCOUNTER — Encounter: Payer: Self-pay | Admitting: Family Medicine

## 2011-07-28 ENCOUNTER — Other Ambulatory Visit: Payer: Self-pay | Admitting: Cardiology

## 2011-07-29 NOTE — Telephone Encounter (Signed)
..   Requested Prescriptions   Pending Prescriptions Disp Refills  . hydrochlorothiazide (HYDRODIURIL) 25 MG tablet [Pharmacy Med Name: HYDROCHLOROTHIAZIDE 25 MG TAB] 30 tablet 6    Sig: take 1/2 tablet by mouth once daily

## 2011-08-04 ENCOUNTER — Encounter: Payer: Self-pay | Admitting: Family Medicine

## 2011-08-04 ENCOUNTER — Ambulatory Visit (INDEPENDENT_AMBULATORY_CARE_PROVIDER_SITE_OTHER): Payer: Medicare Other | Admitting: Family Medicine

## 2011-08-04 VITALS — BP 122/74 | HR 70 | Temp 97.8°F | Ht 68.0 in | Wt 202.0 lb

## 2011-08-04 DIAGNOSIS — M75102 Unspecified rotator cuff tear or rupture of left shoulder, not specified as traumatic: Secondary | ICD-10-CM

## 2011-08-04 DIAGNOSIS — S43429A Sprain of unspecified rotator cuff capsule, initial encounter: Secondary | ICD-10-CM

## 2011-08-04 DIAGNOSIS — M25519 Pain in unspecified shoulder: Secondary | ICD-10-CM

## 2011-08-04 NOTE — Progress Notes (Signed)
Patient Name: Jeffrey [name] Date of Birth: [birth year] Age: 71 y.o. Medical Record Number: [id number] Gender: male Date of Encounter: 08/04/2011  History of Present Illness:  Jeffrey [name] is a 71 y.o. very pleasant male patient who presents with the following:  Larey Seat on his shoulder 05/2011, sprained his ankle, pulled some things in his ribs and in some way, braced his left arm and nothing was broken. But now his left sholder is still hurting quite a bit. Has been to Dr. Cletis Athens for about a month.   Painful at night. Low dull ache.   The patient has some very significant limitation in his movement in the shoulder, with limitation in abduction and flexion. Significant weakness in these planes of motion on the LEFT. His passive range of motion remains full. A deep dull ache that will often keeps him up at night in a T-shirt distribution.  Patient Active Problem List  Diagnoses  . HTN (hypertension)  . Dyslipidemia  . Type 2 diabetes, uncontrolled, with neuropathy  . Ejection fraction  . CAD (coronary artery disease)  . Gout  . Adenomatous polyps  . Epigastric abdominal pain  . Acute chest wall pain   Past Medical History  Diagnosis Date  . HTN (hypertension)   . CAD (coronary artery disease)     PCI distal RCA ...2004, residual 70% LAD   /   ...nuclear...03/2007...no ischemia.Marland KitchenMarland Kitchenpreserved LV /  nuclear...03/03/2009...inferior scar..no ischemia..EF 51%  . Dyslipidemia   . Type 2 diabetes, uncontrolled, with neuropathy 1989    insulin pump by Dr. Sharl Ma  . Adenomatous polyps     colonoscopy 2010  . Internal hemorrhoids   . Pre-syncope     Presyncopal episode while standing in the heat April, 2012  . Ejection fraction     EF 55-60%, echo, December, 2010  . History of chicken pox   . Gout   . GERD (gastroesophageal reflux disease)    Past Surgical History  Procedure Date  . Cataract extraction 2010    bilateral  . Elbow surgery 1997  . Balloon angioplasty, artery 1992,  2004    CAD, Dr. Myrtis Ser  . Stress myoview 2009    abnl, preserved EF, inferior scar and small apical ischemia  . Colonoscopy 04/03/2002    adenomatous polyp, int hemorrhoids  . Colonoscopy 07/18/2007    normal   History  Substance Use Topics  . Smoking status: Former Games developer  . Smokeless tobacco: Never Used   Comment: remotely  . Alcohol Use: No   Family History  Problem Relation Age of Onset  . Alcohol abuse Father   . Coronary artery disease Neg Hx   . Stroke Neg Hx   . Cancer Neg Hx   . Diabetes Neg Hx    Allergies  Allergen Reactions  . Niacin     REACTION: intolerant,not allergic   Current Outpatient Prescriptions on File Prior to Visit  Medication Sig Dispense Refill  . amLODipine (NORVASC) 10 MG tablet Take 10 mg by mouth daily.        [name] aspirin 81 MG tablet Take 81 mg by mouth daily.        [name] doxazosin (CARDURA) 4 MG tablet Take 2 mg by mouth 2 (two) times daily.       . fosinopril ([name]) 40 MG tablet Take 40 mg by mouth daily.        . furosemide (LASIX) 40 MG tablet Take 40 mg by mouth daily.       [name]  hydrochlorothiazide (HYDRODIURIL) 25 MG tablet take 1/2 tablet by mouth once daily  30 tablet  6  . insulin aspart (NOVOLOG) 100 UNIT/ML injection In pump      . Insulin Infusion Pump (H-TRON V100 INSULIN PUMP) MISC by Does not apply route. humalog       . metoprolol (LOPRESSOR) 50 MG tablet Take 50 mg by mouth 2 (two) times daily.        . naproxen (NAPROSYN) 375 MG tablet Take 1 tablet (375 mg total) by mouth 2 (two) times daily.  20 tablet  0  . rosuvastatin (CRESTOR) 40 MG tablet Take 40 mg by mouth daily.       . colchicine 0.6 MG tablet Take 0.6 mg by mouth as needed. May take 2 for gout flare followed by one twice daily until flare subsided      . DISCONTD: pantoprazole (PROTONIX) 40 MG tablet Take 40 mg by mouth daily.          Past Medical History, Surgical History, Social History, Family History, Problem List, Medications, and Allergies have been reviewed  and updated if relevant.  Review of Systems:  GEN: No fevers, chills. Nontoxic. Primarily MSK c/o today. MSK: Detailed in the HPI GI: tolerating PO intake without difficulty Neuro: No numbness, parasthesias, or tingling associated. Otherwise the pertinent positives of the ROS are noted above.    Physical Examination: Filed Vitals:   08/04/11 1511  BP: 122/74  Pulse: 70  Temp: 97.8 F (36.6 C)  TempSrc: Oral  Height: 5\' 8"  (1.727 m)  Weight: 202 lb (91.627 kg)    Body mass index is 30.71 kg/(m^2).   GEN: WDWN, NAD, Non-toxic, Alert & Oriented x 3 HEENT: Atraumatic, Normocephalic.  Ears and Nose: No external deformity. EXTR: No clubbing/cyanosis/edema NEURO: Normal gait.  PSYCH: Normally interactive. Conversant. Not depressed or anxious appearing.  Calm demeanor.   L Shoulder: nontender along the clavicle. Minimally tender at the a.c. Joint. Nontender in the bicipital groove. Elevation of the LEFT scapula and able to abduct his shoulder approximately 40-50 actively. Passive range of motion remains full in all directions.positive drop test.  Abduction 3+/5 Flexion 3+/5 Internal and external rotation 5/5  Hawkins and neer are pos Speeds pos  Assessment and Plan:  1. Shoulder pain  MR Shoulder Left Wo Contrast  2. Rotator cuff tear, left  MR Shoulder Left Wo Contrast   Two-month history of LEFT shoulder pain with worrisome clinical history and examination for rotator cuff tear.obtain MRI of the LEFT shoulder without contrast to evaluate for full thickness supraspinatus tear. Positive drop test. Significantly altered shoulder mechanics and ability to abduct.  Orders Today: Orders Placed This Encounter  Procedures  . MR Shoulder Left Wo Contrast    Standing Status: Future     Number of Occurrences:      Standing Expiration Date: 10/03/2012    WT-202/NOT CLAUS/PREV XR LEFT SHOULDER 05-26-11 AT [facility]/NO NEEDS PER OFFICE/EPIC ORDER/[facility]    Order Specific  Question:  Reason for exam:    Answer:  concern for rotator cuff tear    Order Specific Question:  Preferred imaging location?    Answer:  [id number]    Order Specific Question:  Does the patient have a pacemaker, internal devices, implants, aneury    Answer:  No    Medications Today: No orders of the defined types were placed in this encounter.

## 2011-08-04 NOTE — Patient Instructions (Signed)
REFERRAL: GO THE THE FRONT ROOM AT THE ENTRANCE OF OUR CLINIC, NEAR CHECK IN. ASK FOR MARION. SHE WILL HELP YOU SET UP YOUR REFERRAL. DATE: TIME:  

## 2011-08-10 ENCOUNTER — Ambulatory Visit
Admission: RE | Admit: 2011-08-10 | Discharge: 2011-08-10 | Disposition: A | Discharge status: Home / Self Care | Payer: Medicare Other | Source: Ambulatory Visit | Attending: Family Medicine | Admitting: Family Medicine

## 2011-08-10 DIAGNOSIS — M25519 Pain in unspecified shoulder: Secondary | ICD-10-CM

## 2011-08-10 DIAGNOSIS — M75102 Unspecified rotator cuff tear or rupture of left shoulder, not specified as traumatic: Secondary | ICD-10-CM

## 2011-08-11 ENCOUNTER — Other Ambulatory Visit: Payer: Self-pay | Admitting: Family Medicine

## 2011-08-11 DIAGNOSIS — M75100 Unspecified rotator cuff tear or rupture of unspecified shoulder, not specified as traumatic: Secondary | ICD-10-CM

## 2011-08-15 ENCOUNTER — Telehealth: Payer: Self-pay | Admitting: Cardiology

## 2011-08-15 NOTE — Telephone Encounter (Signed)
New Problem:    Patient called in because he has should er surgery coming up and needs a surgical clearance conformation sent to Dr. Polo Riley at [facility].  Please call back.

## 2011-08-15 NOTE — Telephone Encounter (Signed)
Pt states he is having repair of rotator cuff on Thursday, 08/18/11.  He needs a surgery clearance faxed.  Pt states he is doing well.  No c.p, no sob.

## 2011-08-16 NOTE — Telephone Encounter (Signed)
Clearance was faxed and pt was notified.

## 2011-08-17 ENCOUNTER — Encounter: Payer: Self-pay | Admitting: Family Medicine

## 2011-09-05 HISTORY — PX: ROTATOR CUFF REPAIR: SHX139

## 2011-09-18 ENCOUNTER — Encounter: Payer: Self-pay | Admitting: Family Medicine

## 2011-10-12 ENCOUNTER — Encounter: Payer: Self-pay | Admitting: Family Medicine

## 2011-10-12 ENCOUNTER — Ambulatory Visit (INDEPENDENT_AMBULATORY_CARE_PROVIDER_SITE_OTHER): Payer: Medicare Other | Admitting: Family Medicine

## 2011-10-12 VITALS — BP 126/78 | HR 72 | Temp 97.8°F | Wt 209.2 lb

## 2011-10-12 DIAGNOSIS — M6530 Trigger finger, unspecified finger: Secondary | ICD-10-CM

## 2011-10-12 DIAGNOSIS — M65312 Trigger thumb, left thumb: Secondary | ICD-10-CM | POA: Insufficient documentation

## 2011-10-12 NOTE — Patient Instructions (Addendum)
Pass by Jeffrey [name]'s office for referral to hand doctor. Good to see you today, call us with quesitons.

## 2011-10-12 NOTE — Progress Notes (Signed)
Subjective:    Patient ID: Jeffrey [name], male    DOB: February 09, 1941, 71 y.o.   MRN: [id number]  HPI CC: thumb pain  Left thumb snaps.  Has noted nodule that has develop over last 1-2 years.  Locking/catching also present for last year.  Certain movement causes severe electrical jolt pain isolated to thumb.  Worsened after fall back in March where he fell on outstretched hand (s/p RTC surgery).  NSAIDs have not helped this pain.  Worse first thing in am.   At times has to unlock thumb with other hand No problems with other fingers or right thumb. No h/o surgeries in past.   Medications and allergies reviewed and updated in chart.  Past histories reviewed and updated if relevant as below. Patient Active Problem List  Diagnosis  . HTN (hypertension)  . Dyslipidemia  . Type 2 diabetes, uncontrolled, with neuropathy  . Ejection fraction  . CAD (coronary artery disease)  . Gout  . Adenomatous polyps  . Epigastric abdominal pain  . Acute chest wall pain  . Trigger thumb of left hand   Past Medical History  Diagnosis Date  . HTN (hypertension)   . CAD (coronary artery disease)     PCI distal RCA ...2004, residual 70% LAD   /   ...nuclear...03/2007...no ischemia.Marland KitchenMarland Kitchenpreserved LV /  nuclear...03/03/2009...inferior scar..no ischemia..EF 51%  . Dyslipidemia   . Type 2 diabetes, uncontrolled, with neuropathy 1989    insulin pump by Dr. Sharl Ma  . Adenomatous polyps     colonoscopy 2010  . Internal hemorrhoids   . Pre-syncope     Presyncopal episode while standing in the heat April, 2012  . Ejection fraction     EF 55-60%, echo, December, 2010  . History of chicken pox   . Gout   . GERD (gastroesophageal reflux disease)    Past Surgical History  Procedure Date  . Cataract extraction 2010    bilateral  . Elbow surgery 1997  . Balloon angioplasty, artery 1992, 2004    CAD, Dr. Myrtis Ser  . Stress myoview 2009    abnl, preserved EF, inferior scar and small apical ischemia  .  Colonoscopy 04/03/2002    adenomatous polyp, int hemorrhoids  . Colonoscopy 07/18/2007    normal  . Rotator cuff repair 09/2011    left, with subacromial decompression   History  Substance Use Topics  . Smoking status: Former Games developer  . Smokeless tobacco: Never Used   Comment: remotely  . Alcohol Use: No   Family History  Problem Relation Age of Onset  . Alcohol abuse Father   . Coronary artery disease Neg Hx   . Stroke Neg Hx   . Cancer Neg Hx   . Diabetes Neg Hx    Allergies  Allergen Reactions  . Niacin     REACTION: intolerant,not allergic   Current Outpatient Prescriptions on File Prior to Visit  Medication Sig Dispense Refill  . amLODipine (NORVASC) 10 MG tablet Take 10 mg by mouth daily.        [name] aspirin 81 MG tablet Take 81 mg by mouth daily.        [name] doxazosin (CARDURA) 4 MG tablet Take 2 mg by mouth 2 (two) times daily.       . fosinopril ([name]) 40 MG tablet Take 40 mg by mouth daily.        . furosemide (LASIX) 40 MG tablet Take 40 mg by mouth daily.       . hydrochlorothiazide (HYDRODIURIL)  25 MG tablet take 1/2 tablet by mouth once daily  30 tablet  6  . insulin aspart (NOVOLOG) 100 UNIT/ML injection In pump      . Insulin Infusion Pump (H-TRON V100 INSULIN PUMP) MISC by Does not apply route. humalog       . metoprolol (LOPRESSOR) 50 MG tablet Take 50 mg by mouth 2 (two) times daily.        . rosuvastatin (CRESTOR) 40 MG tablet Take 40 mg by mouth daily.       . colchicine 0.6 MG tablet Take 0.6 mg by mouth as needed. May take 2 for gout flare followed by one twice daily until flare subsided      . naproxen (NAPROSYN) 375 MG tablet Take 1 tablet (375 mg total) by mouth 2 (two) times daily.  20 tablet  0  . DISCONTD: pantoprazole (PROTONIX) 40 MG tablet Take 40 mg by mouth daily.          Review of Systems Per HPI    Objective:   Physical Exam  Nursing note and vitals reviewed. Constitutional: He appears well-developed and well-nourished. No distress.    Musculoskeletal:       Left thumb: palpable nodule on flexor tendon of thumb.  + locking/catching evident with flexion of thumb. Grip strength intact. Intrinsic mm of hand strength intact No atrophy. 2+ rad pulses bilaterally.  Skin: Skin is warm and dry. No rash noted.       Assessment & Plan:

## 2011-10-12 NOTE — Addendum Note (Signed)
Addended by: Eustaquio Boyden on: 10/12/2011 01:32 PM   Modules accepted: Orders

## 2011-10-12 NOTE — Assessment & Plan Note (Signed)
NSAIDs haven't helped. Pt desires definitive treatment.  Refer to hand.

## 2011-12-08 ENCOUNTER — Ambulatory Visit: Payer: Medicare Other | Admitting: Cardiology

## 2012-02-03 ENCOUNTER — Encounter: Payer: Self-pay | Admitting: Cardiology

## 2012-02-07 ENCOUNTER — Encounter: Payer: Self-pay | Admitting: Cardiology

## 2012-02-07 ENCOUNTER — Telehealth: Payer: Self-pay | Admitting: Cardiology

## 2012-02-07 ENCOUNTER — Ambulatory Visit (INDEPENDENT_AMBULATORY_CARE_PROVIDER_SITE_OTHER): Payer: Medicare Other | Admitting: Cardiology

## 2012-02-07 VITALS — BP 120/70 | HR 58 | Wt 203.0 lb

## 2012-02-07 DIAGNOSIS — I2510 Atherosclerotic heart disease of native coronary artery without angina pectoris: Secondary | ICD-10-CM

## 2012-02-07 DIAGNOSIS — I10 Essential (primary) hypertension: Secondary | ICD-10-CM

## 2012-02-07 DIAGNOSIS — E785 Hyperlipidemia, unspecified: Secondary | ICD-10-CM

## 2012-02-07 LAB — CBC WITH DIFFERENTIAL/PLATELET
Basophils Relative: 0.5 % (ref 0.0–3.0)
Eosinophils Relative: 1.4 % (ref 0.0–5.0)
HCT: 40.8 % (ref 39.0–52.0)
Hemoglobin: 13.6 g/dL (ref 13.0–17.0)
MCV: 89.1 fl (ref 78.0–100.0)
Monocytes Absolute: 0.4 10*3/uL (ref 0.1–1.0)
Neutrophils Relative %: 62.9 % (ref 43.0–77.0)
RBC: 4.58 Mil/uL (ref 4.22–5.81)
WBC: 6 10*3/uL (ref 4.5–10.5)

## 2012-02-07 LAB — LIPID PANEL
HDL: 37.5 mg/dL — ABNORMAL LOW (ref >39.00)
VLDL: 55.4 mg/dL — ABNORMAL HIGH (ref 0.0–40.0)

## 2012-02-07 LAB — BASIC METABOLIC PANEL
BUN: 12 mg/dL (ref 6–23)
GFR: 81.55 mL/min (ref >60.00)
Glucose, Bld: 113 mg/dL — ABNORMAL HIGH (ref 70–99)
Potassium: 3.3 mEq/L — ABNORMAL LOW (ref 3.5–5.1)

## 2012-02-07 LAB — LDL CHOLESTEROL, DIRECT: Direct LDL: 89.6 mg/dL

## 2012-02-07 NOTE — Progress Notes (Signed)
HPI  Patient is seen for follow up of coronary disease. I saw him last in October, 2012. He's doing very well. Earlier in [address]132 had some mild presyncope. He did not need an extensive workup. There was no obvious ongoing problem. His last exercise test was in 2010. He has normal LV function.  Allergies  Allergen Reactions  . Niacin     REACTION: intolerant,not allergic    Current Outpatient Prescriptions  Medication Sig Dispense Refill  . amLODipine (NORVASC) 10 MG tablet Take 10 mg by mouth daily.        [name] aspirin 81 MG tablet Take 81 mg by mouth daily.        . colchicine 0.6 MG tablet Take 0.6 mg by mouth as needed. May take 2 for gout flare followed by one twice daily until flare subsided      . doxazosin (CARDURA) 4 MG tablet Take 2 mg by mouth 2 (two) times daily.       . fosinopril ([name]) 40 MG tablet Take 40 mg by mouth daily.        . furosemide (LASIX) 40 MG tablet Take 40 mg by mouth daily.       . hydrochlorothiazide (HYDRODIURIL) 25 MG tablet take 1/2 tablet by mouth once daily  30 tablet  6  . insulin aspart (NOVOLOG) 100 UNIT/ML injection In pump      . Insulin Infusion Pump (H-TRON V100 INSULIN PUMP) MISC by Does not apply route. humalog       . metoprolol (LOPRESSOR) 50 MG tablet Take 50 mg by mouth 2 (two) times daily.        . rosuvastatin (CRESTOR) 40 MG tablet Take 40 mg by mouth daily.       . [DISCONTINUED] pantoprazole (PROTONIX) 40 MG tablet Take 40 mg by mouth daily.         History   Social History  . Marital Status: Married    Spouse Name: N/A    Number of Children: 2  . Years of Education: N/A   Occupational History  . [employer]    Social History Main Topics  . Smoking status: Former Games developer  . Smokeless tobacco: Never Used     Comment: remotely  . Alcohol Use: No  . Drug Use: No  . Sexually Active: Not on file   Other Topics Concern  . Not on file   Social History Narrative   Caffeine: 6-7 cups coffeeLives with wife, no pets2  grown children, 4 grandchildrenOccu: Baptist PastorEdu: [place]Activity: not muchDiet: plenty of water, fruits and vegetablesEndo: Dr. Sharl Ma    Family History  Problem Relation Age of Onset  . Alcohol abuse Father   . Coronary artery disease Neg Hx   . Stroke Neg Hx   . Cancer Neg Hx   . Diabetes Neg Hx     Past Medical History  Diagnosis Date  . HTN (hypertension)   . CAD (coronary artery disease)     PCI distal RCA ...2004, residual 70% LAD   /   ...nuclear...03/2007...no ischemia.Marland KitchenMarland Kitchenpreserved LV /  nuclear...03/03/2009...inferior scar..no ischemia..EF 51%  . Dyslipidemia   . Type 2 diabetes, uncontrolled, with neuropathy 1989    insulin pump by Dr. Sharl Ma  . Adenomatous polyps     colonoscopy 2010  . Internal hemorrhoids   . Pre-syncope     Presyncopal episode while standing in the heat April, 2012  . Ejection fraction     EF 55-60%, echo, December, 2010  .  History of chicken pox   . Gout   . GERD (gastroesophageal reflux disease)     Past Surgical History  Procedure Date  . Cataract extraction 2010    bilateral  . Elbow surgery 1997  . Balloon angioplasty, artery 1992, 2004    CAD, Dr. Myrtis Ser  . Stress myoview 2009    abnl, preserved EF, inferior scar and small apical ischemia  . Colonoscopy 04/03/2002    adenomatous polyp, int hemorrhoids  . Colonoscopy 07/18/2007    normal  . Rotator cuff repair 09/2011    left, with subacromial decompression    Patient Active Problem List  Diagnosis  . HTN (hypertension)  . Dyslipidemia  . Type 2 diabetes, uncontrolled, with neuropathy  . Ejection fraction  . CAD (coronary artery disease)  . Gout  . Adenomatous polyps  . Epigastric abdominal pain  . Acute chest wall pain  . Trigger thumb of left hand    ROS   Patient denies fever, chills, headache, sweats, rash, change in vision, change in hearing, chest pain, cough, nausea vomiting, urinary symptoms. All other systems are reviewed and are  negative.  PHYSICAL EXAM  He is quite stable. He is oriented to person time and place. Affect is normal. There is no jugulovenous distention. Lungs are clear. Respiratory effort is nonlabored. Cardiac exam reveals S1-S2. There no clicks or significant murmurs. The abdomen is soft. Is no peripheral edema.  Filed Vitals:   02/07/12 1419  BP: 120/70  Pulse: 58  Weight: 203 lb (92.08 kg)   EKG is done today and reviewed by me. There is mild widening of the QRS. There is no significant change. No significant abnormalities.  ASSESSMENT & PLAN

## 2012-02-07 NOTE — Assessment & Plan Note (Signed)
Blood pressure is controlled. No change in therapy. 

## 2012-02-07 NOTE — Patient Instructions (Addendum)
Your physician recommends that you return for lab work in: today (cbc, tsh, bmet, lipid)  Your physician wants you to follow-up in: 1 year.   You will receive a reminder letter in the mail two months in advance. If you don't receive a letter, please call our office to schedule the follow-up appointment.  Your physician recommends that you continue on your current medications as directed. Please refer to the Current Medication list given to you today.

## 2012-02-07 NOTE — Assessment & Plan Note (Signed)
Lipids will be drawn today and will be in touch with him with the information.

## 2012-02-07 NOTE — Telephone Encounter (Signed)
Concealed handgun Permit Paper signed/Completed by Dr.Katz  Scanned copy in Original Copy mailed to [facility] [id number] [place] [id number] 02/07/12/KM

## 2012-02-07 NOTE — Assessment & Plan Note (Signed)
Coronary disease is stable. He had a nuclear study in December, 2010. He does not need a followup study now. All seem back in one year.

## 2012-02-10 ENCOUNTER — Telehealth: Payer: Self-pay | Admitting: *Deleted

## 2012-02-10 MED ORDER — POTASSIUM CHLORIDE CRYS ER 20 MEQ PO TBCR: 20.0000 meq | EXTENDED_RELEASE_TABLET | Freq: Every day | ORAL | Status: DC

## 2012-02-10 NOTE — Telephone Encounter (Signed)
Labs reviewed by DOD Dr. Excell Seltzer. K+ 3.3 on 02/07/12. Potassium added daily with repeat bmp in 2-3 weeks Prescription called in for pt. Mylo Red RN

## 2012-02-24 ENCOUNTER — Other Ambulatory Visit: Payer: Self-pay | Admitting: Cardiology

## 2012-02-24 ENCOUNTER — Other Ambulatory Visit (INDEPENDENT_AMBULATORY_CARE_PROVIDER_SITE_OTHER): Payer: Medicare Other

## 2012-02-24 DIAGNOSIS — I10 Essential (primary) hypertension: Secondary | ICD-10-CM

## 2012-02-24 LAB — BASIC METABOLIC PANEL
Calcium: 9.7 mg/dL (ref 8.4–10.5)
GFR: 69.07 mL/min (ref >60.00)
Sodium: 140 mEq/L (ref 135–145)

## 2012-06-08 ENCOUNTER — Telehealth: Payer: Self-pay | Admitting: Cardiology

## 2012-06-08 DIAGNOSIS — E876 Hypokalemia: Secondary | ICD-10-CM

## 2012-06-08 MED ORDER — POTASSIUM CHLORIDE CRYS ER 20 MEQ PO TBCR: EXTENDED_RELEASE_TABLET | ORAL | Status: DC

## 2012-06-08 NOTE — Telephone Encounter (Signed)
New problem   Pt is taking Klor-Con 20MG ..his prescription was for 1 a day 90 pills, but doc changed him to 2 a day, but pt's prescription is still for 1 a day. Pt need a new prescription for 2x daily. [facility] [place]..[phone].

## 2012-06-08 NOTE — Telephone Encounter (Signed)
Sent Rx to pharmacy of patient's choice - [facility] [address] [place]

## 2012-06-11 ENCOUNTER — Encounter: Payer: Self-pay | Admitting: Family Medicine

## 2012-06-11 ENCOUNTER — Ambulatory Visit (INDEPENDENT_AMBULATORY_CARE_PROVIDER_SITE_OTHER): Payer: Medicare Other | Admitting: Family Medicine

## 2012-06-11 VITALS — BP 124/72 | HR 80 | Temp 98.8°F | Wt 201.5 lb

## 2012-06-11 DIAGNOSIS — J309 Allergic rhinitis, unspecified: Secondary | ICD-10-CM

## 2012-06-11 DIAGNOSIS — J302 Other seasonal allergic rhinitis: Secondary | ICD-10-CM | POA: Insufficient documentation

## 2012-06-11 MED ORDER — FLUTICASONE PROPIONATE 50 MCG/ACT NA SUSP: 2.0000 | Freq: Every day | NASAL | Status: DC

## 2012-06-11 MED ORDER — FEXOFENADINE HCL 180 MG PO TABS: 180.0000 mg | ORAL_TABLET | Freq: Every day | ORAL | Status: DC

## 2012-06-11 NOTE — Progress Notes (Signed)
  Subjective:    Patient ID: Jeffrey [name], male    DOB: 05-11-1940, 72 y.o.   MRN: [id number]  HPI  CC: allergies  In spring and fall tends to have worsening allergy symptoms - rhinorrhea, itchy eyes, coughing productive of mild phlegm, some congestion, PNdrainage.  Hoarse voice.  sxs started after outdoor wedding last week with exposure to large amt plant pollen.  No fevers/chills. No HA.  Avoids OTC meds 2/2 HTN. Currently taking tylenol. Has not tried nasal saline.  Past Medical History  Diagnosis Date  . HTN (hypertension)   . CAD (coronary artery disease)     PCI distal RCA ...2004, residual 70% LAD   /   ...nuclear...03/2007...no ischemia.Marland KitchenMarland Kitchenpreserved LV /  nuclear...03/03/2009...inferior scar..no ischemia..EF 51%  . Dyslipidemia   . Type 2 diabetes, uncontrolled, with neuropathy 1989    insulin pump by Dr. Sharl Ma  . Adenomatous polyps     colonoscopy 2010  . Internal hemorrhoids   . Pre-syncope     Presyncopal episode while standing in the heat April, 2012  . Ejection fraction     EF 55-60%, echo, December, 2010  . History of chicken pox   . Gout   . GERD (gastroesophageal reflux disease)      Review of Systems Per HPI    Objective:   Physical Exam  Nursing note and vitals reviewed. Constitutional: He appears well-developed and well-nourished. No distress.  HENT:  Head: Normocephalic and atraumatic.  Right Ear: Hearing, tympanic membrane, external ear and ear canal normal.  Left Ear: Hearing, tympanic membrane, external ear and ear canal normal.  Nose: Rhinorrhea present. No mucosal edema. Right sinus exhibits no maxillary sinus tenderness and no frontal sinus tenderness. Left sinus exhibits no maxillary sinus tenderness and no frontal sinus tenderness.  Mouth/Throat: Uvula is midline, oropharynx is clear and moist and mucous membranes are normal. No oropharyngeal exudate, posterior oropharyngeal edema, posterior oropharyngeal erythema or tonsillar abscesses.   Pale turbinates Posterior oropharyngeal cobblestoning  Eyes: Conjunctivae and EOM are normal. Pupils are equal, round, and reactive to light. No scleral icterus.  Neck: Normal range of motion. Neck supple.  Cardiovascular: Normal rate, regular rhythm, normal heart sounds and intact distal pulses.   No murmur heard. Pulmonary/Chest: Effort normal and breath sounds normal. No respiratory distress. He has no wheezes. He has no rales.  Lymphadenopathy:    He has no cervical adenopathy.       Assessment & Plan:

## 2012-06-11 NOTE — Assessment & Plan Note (Signed)
What sounds like uncomplicated allergic rhinitis to pollen. Recommended allergen avoidance, regular nasal saline irrigation, daily antihistamine, and if no better with this, start daily intranasal steroid. Discussed administration of steroid. If not better, pt to update me.

## 2012-06-11 NOTE — Patient Instructions (Addendum)
For allergies - avoid exposure to known allergens as much as you can. Start nasal saline irrigation over the counter. Start allegra or zyrtec daily over the counter. If this doesn't hellp, fill flonase prescription that is at your pharmacy. If not better with this, let me know.  Allergic Rhinitis Allergic rhinitis is when the mucous membranes in the nose respond to allergens. Allergens are particles in the air that cause your body to have an allergic reaction. This causes you to release allergic antibodies. Through a chain of events, these eventually cause you to release histamine into the blood stream (hence the use of antihistamines). Although meant to be protective to the body, it is this release that causes your discomfort, such as frequent sneezing, congestion and an itchy runny nose.  CAUSES  The pollen allergens may come from grasses, trees, and weeds. This is seasonal allergic rhinitis, or "hay fever." Other allergens cause year-round allergic rhinitis (perennial allergic rhinitis) such as house dust mite allergen, pet dander and mold spores.  SYMPTOMS   Nasal stuffiness (congestion).  Runny, itchy nose with sneezing and tearing of the eyes.  There is often an itching of the mouth, eyes and ears. It cannot be cured, but it can be controlled with medications. DIAGNOSIS  If you are unable to determine the offending allergen, skin or blood testing may find it. TREATMENT   Avoid the allergen.  Medications and allergy shots (immunotherapy) can help.  Hay fever may often be treated with antihistamines in pill or nasal spray forms. Antihistamines block the effects of histamine. There are over-the-counter medicines that may help with nasal congestion and swelling around the eyes. Check with your caregiver before taking or giving this medicine. If the treatment above does not work, there are many new medications your caregiver can prescribe. Stronger medications may be used if initial  measures are ineffective. Desensitizing injections can be used if medications and avoidance fails. Desensitization is when a patient is given ongoing shots until the body becomes less sensitive to the allergen. Make sure you follow up with your caregiver if problems continue. SEEK MEDICAL CARE IF:   You develop fever (more than 100.5 F (38.1 C).  You develop a cough that does not stop easily (persistent).  You have shortness of breath.  You start wheezing.  Symptoms interfere with normal daily activities. Document Released: 11/16/2000 Document Revised: 05/16/2011 Document Reviewed: 05/28/2008 [facility] Patient Information 2013 [place], [place].

## 2012-06-13 ENCOUNTER — Telehealth: Payer: Self-pay

## 2012-06-13 MED ORDER — GUAIFENESIN-CODEINE 100-10 MG/5ML PO SYRP: 5.0000 mL | ORAL_SOLUTION | Freq: Two times a day (BID) | ORAL | Status: DC | PRN

## 2012-06-13 NOTE — Telephone Encounter (Signed)
Sounds like improving. sxs didn't seem infectious in nature when I saw him. I'd suggest using cough syrup at night time - may call in codeine cough syrup for him. Also recommend honey with lemon, herbal tea for hoarseness. If not better with this to call me on Friday for antibiotic.

## 2012-06-13 NOTE — Telephone Encounter (Signed)
Patient notified and Rx called in as directed. He will call back if no improvement.

## 2012-06-13 NOTE — Telephone Encounter (Signed)
Pt seen 06/11/12; pt using nasal saline irrigation, allegra and flonase bid; productive cough seems worse at night(pt not sure color of phlegm); itcy eyes and drainage at back of throat are better. No fever, chills or h/a. Pt is still hoarse but no S/T. Pt request antibiotic called to [facility].Please advise.

## 2012-06-29 ENCOUNTER — Telehealth: Payer: Self-pay

## 2012-06-29 NOTE — Telephone Encounter (Signed)
Pt is to see endocrinologist today and pt wants Dr Timoteo Expose opinion of how often pt should have labs done since he is diabetic and has heart condition; also due to meds pt is taking how often should labs be checked to confirm liver and kidney function is OK? Pt has appt today at 3 pm with endocrinologist and wants to discuss with him today without offending the doctor. Please advise.

## 2012-06-29 NOTE — Telephone Encounter (Signed)
One would typically check an A1c 2-3 times a year.  He may not have to have all of the other labs done at endo clinic- ie cardiology had checked his kidney test in 12/13.  The endo clinic may be reviewing the labs from other clinics (to prevent duplication) and that is reasonable.  I would have the patient ask the endocrine clinic/MD when they plan to check his A1c next and go from there.

## 2012-06-29 NOTE — Telephone Encounter (Signed)
Left detailed message on VM.

## 2012-07-13 ENCOUNTER — Encounter: Payer: Self-pay | Admitting: Gastroenterology

## 2012-07-19 ENCOUNTER — Encounter: Payer: Self-pay | Admitting: Gastroenterology

## 2012-08-05 ENCOUNTER — Encounter: Payer: Self-pay | Admitting: Family Medicine

## 2012-08-09 ENCOUNTER — Other Ambulatory Visit: Payer: Self-pay

## 2012-08-09 MED ORDER — HYDROCHLOROTHIAZIDE 25 MG PO TABS: 12.5000 mg | ORAL_TABLET | Freq: Every day | ORAL | Status: DC

## 2012-08-09 NOTE — Telephone Encounter (Signed)
hydrochlorothiazide (HYDRODIURIL) 25 MG tablet  take 1/2 tablet by mouth once daily   30 tablet   Your physician recommends that you continue on your current medications as directed. Please refer to the Current Medication list given to you today. [name], MD at 02/08/2012  8:46 PM

## 2012-08-29 ENCOUNTER — Encounter: Payer: Self-pay | Admitting: Cardiology

## 2012-09-05 ENCOUNTER — Telehealth: Payer: Self-pay | Admitting: *Deleted

## 2012-09-05 ENCOUNTER — Ambulatory Visit (AMBULATORY_SURGERY_CENTER): Payer: Medicare Other | Admitting: *Deleted

## 2012-09-05 VITALS — Ht 68.0 in | Wt 205.0 lb

## 2012-09-05 DIAGNOSIS — Z86010 Personal history of colonic polyps: Secondary | ICD-10-CM

## 2012-09-05 MED ORDER — MOVIPREP 100 G PO SOLR: ORAL | Status: DC

## 2012-09-05 NOTE — Progress Notes (Signed)
Patient has insulin pump and is for recall colonoscopy. Sent phone note to [employer].

## 2012-09-05 NOTE — Telephone Encounter (Signed)
Sent letter to Dr. Sharen Hones to find out about adjusting insulin pump before Colonoscopy. Waiting on a response.

## 2012-09-05 NOTE — Telephone Encounter (Signed)
Patient has Insulin Pump, for recall colonoscopy on Wed. 09/26/12 @ 8:30 am with Dr.Stark. Please follow up. Thanks.

## 2012-09-06 ENCOUNTER — Encounter: Payer: Self-pay | Admitting: Gastroenterology

## 2012-09-10 NOTE — Telephone Encounter (Signed)
Message    I've forwarded your letter to Dr. Sharl Ma, who is patient's endocrinologist and manages his insulin pump.   Thanks,   Eustaquio Boyden

## 2012-09-13 ENCOUNTER — Other Ambulatory Visit: Payer: Self-pay

## 2012-09-13 NOTE — Telephone Encounter (Signed)
Left a message with Jeffrey [name] at Dr. Daune Perch [place] Sprang at [place]) office asking if she received the fax insulin protocol letter and if Dr. Sharl Ma could fill out it and fax it back. Purnell Shoemaker on voicemail to just call me back. There phone number is [phone].

## 2012-09-19 NOTE — Telephone Encounter (Signed)
Left another message with Victorino Dike at Dr. Daune Perch office regarding the insulin letter.

## 2012-09-21 NOTE — Telephone Encounter (Signed)
Left another message for Victorino Dike to call me back regarding this patient's insulin pump letter.

## 2012-09-21 NOTE — Telephone Encounter (Signed)
Spoke with "Mardelle Matte" at Dr.Kerr's office. She states that Dr.Kerr has been trying to get in touch with the patient, and left him messages about the insulin pump instructions. "Mardelle Matte" states Dr.Kerr will tell the patient directly and then let us know. She states he will continue to try through the weekend to get in touch with the patient. Marchelle Folks CMA notified of this also.

## 2012-09-21 NOTE — Telephone Encounter (Signed)
Per Mardelle Matte at Dr. Daune Perch office Dr. Sharl Ma has been trying to get in touch with the patient directly and will fax back the insulin letter as soon as possibly. Mardelle Matte states she does not appreciate our office calling so much and leaving voicemail messages. Told Mardelle Matte that if there office would of just called [place] back and told us what Dr. Sharl Ma was doing with the patient then we would of stopped. Mardelle Matte states Dr. Sharl Ma is taking care of the patient and they will contact us by fax after they talk to the patient.

## 2012-09-26 ENCOUNTER — Ambulatory Visit (AMBULATORY_SURGERY_CENTER): Payer: Medicare Other | Admitting: Gastroenterology

## 2012-09-26 ENCOUNTER — Encounter: Payer: Self-pay | Admitting: Gastroenterology

## 2012-09-26 VITALS — BP 104/61 | HR 57 | Temp 96.8°F | Resp 18 | Ht 68.0 in | Wt 205.0 lb

## 2012-09-26 DIAGNOSIS — D126 Benign neoplasm of colon, unspecified: Secondary | ICD-10-CM

## 2012-09-26 DIAGNOSIS — Z86010 Personal history of colonic polyps: Secondary | ICD-10-CM

## 2012-09-26 HISTORY — PX: COLONOSCOPY: SHX174

## 2012-09-26 LAB — GLUCOSE, CAPILLARY: Glucose-Capillary: 151 mg/dL — ABNORMAL HIGH (ref 70–99)

## 2012-09-26 MED ORDER — SODIUM CHLORIDE 0.9 % IV SOLN: 500.0000 mL | INTRAVENOUS | Status: DC

## 2012-09-26 NOTE — Progress Notes (Signed)
Patient did not experience any of the following events: a burn prior to discharge; a fall within the facility; wrong site/side/patient/procedure/implant event; or a hospital transfer or hospital admission upon discharge from the facility. (G8907) Patient did not have preoperative order for IV antibiotic SSI prophylaxis. (G8918)  

## 2012-09-26 NOTE — Progress Notes (Signed)
Called to room to assist during endoscopic procedure.  Patient ID and intended procedure confirmed with present staff. Received instructions for my participation in the procedure from the performing physician.  

## 2012-09-26 NOTE — Patient Instructions (Signed)
YOU HAD AN ENDOSCOPIC PROCEDURE TODAY AT THE [facility]: Refer to the procedure report that was given to you for any specific questions about what was found during the examination.  If the procedure report does not answer your questions, please call your gastroenterologist to clarify.  If you requested that your care partner not be given the details of your procedure findings, then the procedure report has been included in a sealed envelope for you to review at your convenience later.  YOU SHOULD EXPECT: Some feelings of bloating in the abdomen. Passage of more gas than usual.  Walking can help get rid of the air that was put into your GI tract during the procedure and reduce the bloating. If you had a lower endoscopy (such as a colonoscopy or flexible sigmoidoscopy) you may notice spotting of blood in your stool or on the toilet paper. If you underwent a bowel prep for your procedure, then you may not have a normal bowel movement for a few days.  DIET: Your first meal following the procedure should be a light meal and then it is ok to progress to your normal diet.  A half-sandwich or bowl of soup is an example of a good first meal.  Heavy or fried foods are harder to digest and may make you feel nauseous or bloated.  Likewise meals heavy in dairy and vegetables can cause extra gas to form and this can also increase the bloating.  Drink plenty of fluids but you should avoid alcoholic beverages for 24 hours.  ACTIVITY: Your care partner should take you home directly after the procedure.  You should plan to take it easy, moving slowly for the rest of the day.  You can resume normal activity the day after the procedure however you should NOT DRIVE or use heavy machinery for 24 hours (because of the sedation medicines used during the test).    SYMPTOMS TO REPORT IMMEDIATELY: A gastroenterologist can be reached at any hour.  During normal business hours, 8:30 AM to 5:00 PM Monday through Friday,  call [phone].  After hours and on weekends, please call the GI answering service at [phone] who will take a message and have the physician on call contact you.   Following lower endoscopy (colonoscopy or flexible sigmoidoscopy):  Excessive amounts of blood in the stool  Significant tenderness or worsening of abdominal pains  Swelling of the abdomen that is new, acute  Fever of 100F or higher   FOLLOW UP: If any biopsies were taken you will be contacted by phone or by letter within the next 1-3 weeks.  Call your gastroenterologist if you have not heard about the biopsies in 3 weeks.  Our staff will call the home number listed on your records the next business day following your procedure to check on you and address any questions or concerns that you may have at that time regarding the information given to you following your procedure. This is a courtesy call and so if there is no answer at the home number and we have not heard from you through the emergency physician on call, we will assume that you have returned to your regular daily activities without incident.  SIGNATURES/CONFIDENTIALITY: You and/or your care partner have signed paperwork which will be entered into your electronic medical record.  These signatures attest to the fact that that the information above on your After Visit Summary has been reviewed and is understood.  Full responsibility of the confidentiality of   this discharge information lies with you and/or your care-partner.   INFORMATION ON POLYPS &HEMORRHOIDS GIVEN TO YOU TODAY 

## 2012-09-26 NOTE — Progress Notes (Signed)
Procedure ends, to recovery, report given and VSS. 

## 2012-09-26 NOTE — Op Note (Signed)
 [facility] [address] [place], [address]   COLONOSCOPY PROCEDURE REPORT  PATIENT: Jeffrey [name], Jeffrey [name]  MR#: [id number] BIRTHDATE: 07-24-1940 , 71  yrs. old GENDER: Male ENDOSCOPIST: Meryl Dare, MD, [facility] PROCEDURE DATE:  09/26/2012 PROCEDURE:   Colonoscopy with biopsy and snare polypectomy First Screening Colonoscopy - Avg.  risk and is 50 yrs.  old or older - No.  Prior Negative Screening - Now for repeat screening. N/A  History of Adenoma - Now for follow-up colonoscopy & has been > or = to 3 yrs.  Yes hx of adenoma.  Has been 3 or more years since last colonoscopy.  Polyps Removed Today? Yes. ASA CLASS:   Class II INDICATIONS:Patient's personal history of adenomatous colon polyps.  MEDICATIONS: MAC sedation, administered by CRNA and propofol (Diprivan) 200mg  IV DESCRIPTION OF PROCEDURE:   After the risks benefits and alternatives of the procedure were thoroughly explained, informed consent was obtained.  A digital rectal exam revealed no abnormalities of the rectum.   The LB [id number]  endoscope was introduced through the anus and advanced to the cecum, which was identified by both the appendix and ileocecal valve. No adverse events experienced.   The quality of the prep was good, using MoviPrep  The instrument was then slowly withdrawn as the colon was fully examined.  COLON FINDINGS: A sessile polyp measuring 6 mm in size was found in the ascending colon.  A polypectomy was performed with a cold snare.  The resection was complete and the polyp tissue was completely retrieved.  A sessile polyp measuring 4 mm was found int the rectum. A polypectomy with a forceps biopsy was performed. The colon was otherwise normal.  There was no diverticulosis, inflammation, polyps or cancers unless previously stated. Retroflexed views revealed small internal hemorrhoids. The time to cecum=2 minutes 05 seconds.  Withdrawal time=9 minutes 22 seconds. The scope  was withdrawn and the procedure completed.  COMPLICATIONS: There were no complications.  ENDOSCOPIC IMPRESSION: 1.   Sessile polyp measuring 6 mm in the ascending colon; polypectomy performed with a cold snare 2.   Sessile polyp measuring 4 mm in the rectum; polypectomy with a cold biopsy 2.   Small internal hemorrhoids  RECOMMENDATIONS: 1.  Await pathology results 2.  Repeat Colonoscopy in 5 years.   eSigned:  Meryl Dare, MD, [facility] 09/26/2012 9:00 AM      PATIENT NAME:  Jeffrey [name], Jeffrey [name] MR#: [id number]

## 2012-09-28 ENCOUNTER — Telehealth: Payer: Self-pay | Admitting: *Deleted

## 2012-09-28 NOTE — Telephone Encounter (Signed)
  Follow up Call-  Call back number 09/26/2012  Post procedure Call Back phone  # [phone]  Permission to leave phone message Yes     Patient questions:  Do you have a fever, pain , or abdominal swelling? no Pain Score  0 *  Have you tolerated food without any problems? yes  Have you been able to return to your normal activities? no  Do you have any questions about your discharge instructions: Diet   no Medications  no Follow up visit  no  Do you have questions or concerns about your Care? no  Actions: * If pain score is 4 or above: No action needed, pain <4.

## 2012-10-03 ENCOUNTER — Encounter: Payer: Self-pay | Admitting: Gastroenterology

## 2012-10-04 ENCOUNTER — Encounter: Payer: Self-pay | Admitting: Family Medicine

## 2013-01-10 ENCOUNTER — Other Ambulatory Visit: Payer: Self-pay

## 2013-01-20 ENCOUNTER — Other Ambulatory Visit: Payer: Self-pay | Admitting: Family Medicine

## 2013-02-06 ENCOUNTER — Ambulatory Visit (INDEPENDENT_AMBULATORY_CARE_PROVIDER_SITE_OTHER): Payer: Medicare Other | Admitting: Cardiology

## 2013-02-06 ENCOUNTER — Encounter: Payer: Self-pay | Admitting: Cardiology

## 2013-02-06 ENCOUNTER — Other Ambulatory Visit: Payer: Self-pay | Admitting: Cardiology

## 2013-02-06 VITALS — BP 132/64 | HR 57 | Ht 68.0 in | Wt 202.0 lb

## 2013-02-06 DIAGNOSIS — R0600 Dyspnea, unspecified: Secondary | ICD-10-CM

## 2013-02-06 DIAGNOSIS — R0989 Other specified symptoms and signs involving the circulatory and respiratory systems: Secondary | ICD-10-CM

## 2013-02-06 DIAGNOSIS — I2510 Atherosclerotic heart disease of native coronary artery without angina pectoris: Secondary | ICD-10-CM

## 2013-02-06 DIAGNOSIS — R9430 Abnormal result of cardiovascular function study, unspecified: Secondary | ICD-10-CM

## 2013-02-06 DIAGNOSIS — E785 Hyperlipidemia, unspecified: Secondary | ICD-10-CM

## 2013-02-06 DIAGNOSIS — R0609 Other forms of dyspnea: Secondary | ICD-10-CM

## 2013-02-06 DIAGNOSIS — I10 Essential (primary) hypertension: Secondary | ICD-10-CM

## 2013-02-06 DIAGNOSIS — IMO0002 Reserved for concepts with insufficient information to code with codable children: Secondary | ICD-10-CM

## 2013-02-06 NOTE — Patient Instructions (Signed)
Your physician has requested that you have an echocardiogram. Echocardiography is a painless test that uses sound waves to create images of your heart. It provides your doctor with information about the size and shape of your heart and how well your heart's chambers and valves are working. This procedure takes approximately one hour. There are no restrictions for this procedure.  Your physician has requested that you have a carotid duplex. This test is an ultrasound of the carotid arteries in your neck. It looks at blood flow through these arteries that supply the brain with blood. Allow one hour for this exam. There are no restrictions or special instructions.  Your physician wants you to follow-up in: 1 year. You will receive a reminder letter in the mail two months in advance. If you don't receive a letter, please call our office to schedule the follow-up appointment.

## 2013-02-06 NOTE — Assessment & Plan Note (Signed)
There is a right carotid bruit. Carotid Dopplers are being arranged.

## 2013-02-06 NOTE — Assessment & Plan Note (Signed)
Coronary disease is stable. His last exercise test was in 2010. He's not having symptoms. He does not need a stress test at this time.

## 2013-02-06 NOTE — Assessment & Plan Note (Signed)
There is known coronary disease with diabetes. His last echo was 4 years ago. We need to reassess LV function and valvular function. 2-D echo will be arranged.

## 2013-02-06 NOTE — Progress Notes (Signed)
HPI  Patient returns today for one-year followup of coronary disease. He has been stable. I saw him last December, 2013. He has had coronary interventions in the past. He has not had an exercise test since 2010. He's not having any significant chest pain. He has some mild exertional shortness of breath.  Allergies  Allergen Reactions  . Niacin Other (See Comments)    REACTION: intolerant,not allergic="flushing,hot flashes,turning red"    Current Outpatient Prescriptions  Medication Sig Dispense Refill  . amLODipine (NORVASC) 10 MG tablet Take 10 mg by mouth daily.        [name] aspirin 81 MG tablet Take 81 mg by mouth daily.        [name] atorvastatin (LIPITOR) 80 MG tablet Take 1 tablet (80 mg total) by mouth daily.      . Canagliflozin (INVOKANA) 300 MG TABS Take 1 tablet (300 mg total) by mouth daily.  30 tablet    . colchicine 0.6 MG tablet Take 0.6 mg by mouth as needed. May take 2 for gout flare followed by one twice daily until flare subsided      . doxazosin (CARDURA) 4 MG tablet Take 2 mg by mouth 2 (two) times daily.       . fexofenadine (ALLEGRA) 180 MG tablet Take 1 tablet (180 mg total) by mouth daily.      . fluticasone (FLONASE) 50 MCG/ACT nasal spray Place 2 sprays into the nose daily.  16 g  3  . fosinopril ([name]) 40 MG tablet Take 40 mg by mouth daily.        . furosemide (LASIX) 40 MG tablet Take 40 mg by mouth daily.       [name] guaiFENesin-codeine (ROBITUSSIN AC) 100-10 MG/5ML syrup Take 5 mLs by mouth 2 (two) times daily as needed for cough (sedation precautions).  180 mL  0  . insulin aspart (NOVOLOG) 100 UNIT/ML injection In pump      . Insulin Infusion Pump (H-TRON V100 INSULIN PUMP) MISC by Does not apply route. humalog       . metoprolol (LOPRESSOR) 50 MG tablet Take 50 mg by mouth 2 (two) times daily.        . potassium chloride SA (K-DUR,KLOR-CON) 20 MEQ tablet Take 2 tablets (40 mEq total) by mouth two times per day.  90 tablet  3  . [DISCONTINUED] pantoprazole  (PROTONIX) 40 MG tablet Take 40 mg by mouth daily.        No current facility-administered medications for this visit.    History   Social History  . Marital Status: Married    Spouse Name: N/A    Number of Children: 2  . Years of Education: N/A   Occupational History  . [employer]    Social History Main Topics  . Smoking status: Former Games developer  . Smokeless tobacco: Never Used     Comment: quit smoking 40 years ago.  . Alcohol Use: No  . Drug Use: No  . Sexual Activity: Not on file   Other Topics Concern  . Not on file   Social History Narrative   Caffeine: 6-7 cups coffee   Lives with wife, no pets   2 grown children, 4 grandchildren   Occu: Musician   Edu: [employer]   Activity: not much   Diet: plenty of water, fruits and vegetables      Endo: Dr. Sharl Ma    Family History  Problem Relation Age of Onset  . Alcohol  abuse Father   . Coronary artery disease Neg Hx   . Stroke Neg Hx   . Cancer Neg Hx   . Diabetes Neg Hx   . Colon cancer Neg Hx     Past Medical History  Diagnosis Date  . HTN (hypertension)   . CAD (coronary artery disease)     PCI distal RCA ...2004, residual 70% LAD   /   ...nuclear...03/2007...no ischemia.Marland KitchenMarland Kitchenpreserved LV /  nuclear...03/03/2009...inferior scar..no ischemia..EF 51%  . Dyslipidemia   . Type 2 diabetes, uncontrolled, with neuropathy 1989    insulin pump by Dr. Sharl Ma  . Adenomatous polyps     colonoscopy 2010  . Internal hemorrhoids   . Pre-syncope     Presyncopal episode while standing in the heat April, 2012  . Ejection fraction     EF 55-60%, echo, December, 2010  . History of chicken pox   . Gout   . GERD (gastroesophageal reflux disease)   . Seasonal allergic rhinitis   . Right carotid bruit     Past Surgical History  Procedure Laterality Date  . Cataract extraction  2010    bilateral  . Elbow surgery  1997  . Balloon angioplasty, artery  1992, 2004    CAD, Dr. Myrtis Ser  . Stress myoview  2009     abnl, preserved EF, inferior scar and small apical ischemia  . Colonoscopy  04/03/2002    adenomatous polyp, int hemorrhoids  . Colonoscopy  07/18/2007    normal (Dr. Russella Dar)  . Rotator cuff repair  09/2011    left, with subacromial decompression  . Nasal septum surgery    . Colonoscopy  09/26/2012    tubular adenoma, sm int hem, rpt 5 yrs Russella Dar)    Patient Active Problem List   Diagnosis Date Noted  . Right carotid bruit   . Polyneuropathy, diabetic 08/05/2012  . Seasonal allergic rhinitis   . Trigger thumb of left hand 10/12/2011  . Acute chest wall pain 06/01/2011  . Epigastric abdominal pain 11/11/2010  . Gout   . Adenomatous polyps   . CAD (coronary artery disease)   . HTN (hypertension)   . Dyslipidemia   . Type 2 diabetes, uncontrolled, with neuropathy   . Ejection fraction     ROS  Patient denies fever, chills, headache, sweats, rash, change in vision, change in hearing, chest pain, cough, nausea vomiting, urinary symptoms. All other systems are reviewed and are negative.  PHYSICAL EXAM  Patient is oriented to person time and place. Affect is normal. There is no jugular venous distention. There is a soft right carotid bruit. Lungs are clear. Respiratory effort is nonlabored. Cardiac exam reveals S1 and S2. There no clicks. The abdomen is soft. There is no peripheral edema. There is no musculoskeletal deformity. There no skin rashes.  Filed Vitals:   02/06/13 1144  BP: 132/64  Pulse: 57  Height: 5\' 8"  (1.727 m)  Weight: 202 lb (91.627 kg)   EKG is done today and reviewed by me. There is normal sinus rhythm. There are some mild nonspecific ST-T wave change.  ASSESSMENT & PLAN

## 2013-02-06 NOTE — Assessment & Plan Note (Signed)
The patient is on intensive statin treatment. No change in therapy.

## 2013-02-06 NOTE — Assessment & Plan Note (Signed)
Blood pressures control. No change in therapy. 

## 2013-02-25 ENCOUNTER — Ambulatory Visit (HOSPITAL_COMMUNITY): Payer: Medicare Other | Attending: Cardiology

## 2013-02-25 DIAGNOSIS — I10 Essential (primary) hypertension: Secondary | ICD-10-CM | POA: Insufficient documentation

## 2013-02-25 DIAGNOSIS — Z87891 Personal history of nicotine dependence: Secondary | ICD-10-CM | POA: Insufficient documentation

## 2013-02-25 DIAGNOSIS — E785 Hyperlipidemia, unspecified: Secondary | ICD-10-CM | POA: Insufficient documentation

## 2013-02-25 DIAGNOSIS — E119 Type 2 diabetes mellitus without complications: Secondary | ICD-10-CM | POA: Insufficient documentation

## 2013-02-25 DIAGNOSIS — I2510 Atherosclerotic heart disease of native coronary artery without angina pectoris: Secondary | ICD-10-CM | POA: Insufficient documentation

## 2013-02-25 DIAGNOSIS — R0989 Other specified symptoms and signs involving the circulatory and respiratory systems: Secondary | ICD-10-CM | POA: Insufficient documentation

## 2013-02-26 ENCOUNTER — Ambulatory Visit (HOSPITAL_COMMUNITY): Payer: Medicare Other | Attending: Cardiology | Admitting: Radiology

## 2013-02-26 DIAGNOSIS — R0989 Other specified symptoms and signs involving the circulatory and respiratory systems: Secondary | ICD-10-CM | POA: Insufficient documentation

## 2013-02-26 DIAGNOSIS — E119 Type 2 diabetes mellitus without complications: Secondary | ICD-10-CM | POA: Insufficient documentation

## 2013-02-26 DIAGNOSIS — R0600 Dyspnea, unspecified: Secondary | ICD-10-CM

## 2013-02-26 DIAGNOSIS — R0609 Other forms of dyspnea: Secondary | ICD-10-CM | POA: Insufficient documentation

## 2013-02-26 DIAGNOSIS — I2510 Atherosclerotic heart disease of native coronary artery without angina pectoris: Secondary | ICD-10-CM | POA: Insufficient documentation

## 2013-02-26 DIAGNOSIS — E785 Hyperlipidemia, unspecified: Secondary | ICD-10-CM | POA: Insufficient documentation

## 2013-02-26 DIAGNOSIS — I10 Essential (primary) hypertension: Secondary | ICD-10-CM | POA: Insufficient documentation

## 2013-02-26 NOTE — Progress Notes (Signed)
Echocardiogram performed.  

## 2013-02-27 ENCOUNTER — Encounter: Payer: Self-pay | Admitting: Cardiology

## 2013-03-05 ENCOUNTER — Telehealth: Payer: Self-pay | Admitting: Cardiology

## 2013-03-05 NOTE — Telephone Encounter (Signed)
Reviewed echo and carotid doppler results with patient who verbalized understanding and gratitude.

## 2013-03-05 NOTE — Telephone Encounter (Signed)
New Problem:  Pt is requesting a call back with his recent test results. Pt states it is ok for a nurse to leave them on his home voice mail if he does not answer.

## 2013-03-11 ENCOUNTER — Encounter: Payer: Self-pay | Admitting: Family Medicine

## 2013-03-11 ENCOUNTER — Other Ambulatory Visit: Payer: Self-pay | Admitting: Family Medicine

## 2013-03-12 ENCOUNTER — Other Ambulatory Visit (HOSPITAL_COMMUNITY): Payer: Medicare Other

## 2013-03-14 ENCOUNTER — Encounter (HOSPITAL_COMMUNITY): Payer: Medicare Other

## 2013-05-05 DIAGNOSIS — J392 Other diseases of pharynx: Secondary | ICD-10-CM

## 2013-05-05 HISTORY — DX: Other diseases of pharynx: J39.2

## 2013-05-13 ENCOUNTER — Encounter: Payer: Self-pay | Admitting: Family Medicine

## 2013-05-13 ENCOUNTER — Other Ambulatory Visit: Payer: Self-pay | Admitting: Otolaryngology

## 2013-05-13 DIAGNOSIS — J392 Other diseases of pharynx: Secondary | ICD-10-CM

## 2013-05-14 ENCOUNTER — Ambulatory Visit
Admission: RE | Admit: 2013-05-14 | Discharge: 2013-05-14 | Disposition: A | Discharge status: Home / Self Care | Payer: Medicare Other | Source: Ambulatory Visit | Attending: Otolaryngology | Admitting: Otolaryngology

## 2013-05-14 DIAGNOSIS — J392 Other diseases of pharynx: Secondary | ICD-10-CM

## 2013-05-14 MED ORDER — IOHEXOL 300 MG/ML  SOLN
75.0000 mL | Freq: Once | INTRAMUSCULAR | Status: AC | PRN
  Administered 2013-05-14: 75 mL via INTRAVENOUS

## 2013-05-22 ENCOUNTER — Encounter (HOSPITAL_COMMUNITY): Payer: Self-pay | Admitting: Pharmacy Technician

## 2013-05-22 ENCOUNTER — Other Ambulatory Visit: Payer: Self-pay | Admitting: Otolaryngology

## 2013-05-23 ENCOUNTER — Ambulatory Visit (HOSPITAL_COMMUNITY)
Admission: RE | Admit: 2013-05-23 | Discharge: 2013-05-23 | Disposition: A | Discharge status: Home / Self Care | Payer: Medicare Other | Source: Ambulatory Visit | Attending: Anesthesiology | Admitting: Anesthesiology

## 2013-05-23 ENCOUNTER — Encounter (HOSPITAL_COMMUNITY)
Admission: RE | Admit: 2013-05-23 | Discharge: 2013-05-23 | Disposition: A | Discharge status: Home / Self Care | Payer: Medicare Other | Source: Ambulatory Visit | Attending: Otolaryngology | Admitting: Otolaryngology

## 2013-05-23 ENCOUNTER — Encounter (HOSPITAL_COMMUNITY): Payer: Self-pay

## 2013-05-23 DIAGNOSIS — Z01812 Encounter for preprocedural laboratory examination: Secondary | ICD-10-CM | POA: Insufficient documentation

## 2013-05-23 DIAGNOSIS — E119 Type 2 diabetes mellitus without complications: Secondary | ICD-10-CM | POA: Insufficient documentation

## 2013-05-23 DIAGNOSIS — I10 Essential (primary) hypertension: Secondary | ICD-10-CM | POA: Insufficient documentation

## 2013-05-23 DIAGNOSIS — I252 Old myocardial infarction: Secondary | ICD-10-CM | POA: Insufficient documentation

## 2013-05-23 DIAGNOSIS — Z01818 Encounter for other preprocedural examination: Secondary | ICD-10-CM | POA: Insufficient documentation

## 2013-05-23 DIAGNOSIS — I2510 Atherosclerotic heart disease of native coronary artery without angina pectoris: Secondary | ICD-10-CM | POA: Insufficient documentation

## 2013-05-23 HISTORY — DX: Unspecified osteoarthritis, unspecified site: M19.90

## 2013-05-23 HISTORY — DX: Localized edema: R60.0

## 2013-05-23 HISTORY — DX: Personal history of colon polyps, unspecified: Z86.0100

## 2013-05-23 HISTORY — DX: Personal history of colonic polyps: Z86.010

## 2013-05-23 HISTORY — DX: Acute myocardial infarction, unspecified: I21.9

## 2013-05-23 HISTORY — DX: Edema, unspecified: R60.9

## 2013-05-23 LAB — CBC
HEMATOCRIT: 44.3 % (ref 39.0–52.0)
HEMOGLOBIN: 15.6 g/dL (ref 13.0–17.0)
MCH: 31.4 pg (ref 26.0–34.0)
MCHC: 35.2 g/dL (ref 30.0–36.0)
MCV: 89.1 fL (ref 78.0–100.0)
Platelets: 147 10*3/uL — ABNORMAL LOW (ref 150–400)
RBC: 4.97 MIL/uL (ref 4.22–5.81)
RDW: 13.8 % (ref 11.5–15.5)
WBC: 7.8 10*3/uL (ref 4.0–10.5)

## 2013-05-23 LAB — BASIC METABOLIC PANEL
BUN: 21 mg/dL (ref 6–23)
CHLORIDE: 98 meq/L (ref 96–112)
CO2: 28 meq/L (ref 19–32)
Calcium: 9.6 mg/dL (ref 8.4–10.5)
Creatinine, Ser: 1.42 mg/dL — ABNORMAL HIGH (ref 0.50–1.35)
GFR calc Af Amer: 56 mL/min — ABNORMAL LOW (ref >90)
GFR calc non Af Amer: 49 mL/min — ABNORMAL LOW (ref >90)
Glucose, Bld: 220 mg/dL — ABNORMAL HIGH (ref 70–99)
POTASSIUM: 4.4 meq/L (ref 3.7–5.3)
Sodium: 138 mEq/L (ref 137–147)

## 2013-05-23 NOTE — Pre-Procedure Instructions (Signed)
Jeffrey [name]  05/23/2013   Your procedure is scheduled on:  Mon, Mar 23 @ 7:30 AM  Report to [facility]  at 5:30 AM.  Call this number if you have problems the morning of surgery: [phone]   Remember:   Do not eat food or drink liquids after midnight.   Take these medicines the morning of surgery with A SIP OF WATER: Allopurinol(Zyloprim),Amlodipine(Norvasc),Colchicine(if needed),Cardura(Doxazosin),Metoprolol(Lopressor),and Omeprazole(Prilosec)                Stop taking your Aspirin. No Goody's,BC's,Aleve,Ibuprofen,Fish Oil,or any Herbal Medications   Do not wear jewelry  Do not wear lotions, powders, or colognes. You may wear deodorant.  Men may shave face and neck.  Do not bring valuables to the hospital.  [facility] is not responsible                  for any belongings or valuables.               Contacts, dentures or bridgework may not be worn into surgery.  Leave suitcase in the car. After surgery it may be brought to your room.  For patients admitted to the hospital, discharge time is determined by your                treatment team.               Patients discharged the day of surgery will not be allowed to drive  home.    Special Instructions:  [name] - Preparing for Surgery  Before surgery, you can play an important role.  Because skin is not sterile, your skin needs to be as free of germs as possible.  You can reduce the number of germs on you skin by washing with CHG (chlorahexidine gluconate) soap before surgery.  CHG is an antiseptic cleaner which kills germs and bonds with the skin to continue killing germs even after washing.  Please DO NOT use if you have an allergy to CHG or antibacterial soaps.  If your skin becomes reddened/irritated stop using the CHG and inform your nurse when you arrive at Short Stay.  Do not shave (including legs and underarms) for at least 48 hours prior to the first CHG shower.  You may shave your face.  Please  follow these instructions carefully:   1.  Shower with CHG Soap the night before surgery and the                                morning of Surgery.  2.  If you choose to wash your hair, wash your hair first as usual with your       normal shampoo.  3.  After you shampoo, rinse your hair and body thoroughly to remove the                      Shampoo.  4.  Use CHG as you would any other liquid soap.  You can apply chg directly       to the skin and wash gently with scrungie or a clean washcloth.  5.  Apply the CHG Soap to your body ONLY FROM THE NECK DOWN.        Do not use on open wounds or open sores.  Avoid contact with your eyes,       ears, mouth and genitals (private parts).  Wash genitals (private parts)       with your normal soap.  6.  Wash thoroughly, paying special attention to the area where your surgery        will be performed.  7.  Thoroughly rinse your body with warm water from the neck down.  8.  DO NOT shower/wash with your normal soap after using and rinsing off       the CHG Soap.  9.  Pat yourself dry with a clean towel.            10.  Wear clean pajamas.            11.  Place clean sheets on your bed the night of your first shower and do not        sleep with pets.  Day of Surgery  Do not apply any lotions/deoderants the morning of surgery.  Please wear clean clothes to the hospital/surgery center.     Please read over the following fact sheets that you were given: Pain Booklet, Coughing and Deep Breathing and Surgical Site Infection Prevention

## 2013-05-23 NOTE — Progress Notes (Signed)
Fasting blood sugar runs 140-150

## 2013-05-23 NOTE — Progress Notes (Signed)
05/23/13 1449  OBSTRUCTIVE SLEEP APNEA  Have you ever been diagnosed with sleep apnea through a sleep study? No  Do you snore loudly (loud enough to be heard through closed doors)?  1  Do you often feel tired, fatigued, or sleepy during the daytime? 0  Has anyone observed you stop breathing during your sleep? 0  Do you have, or are you being treated for high blood pressure? 1  BMI more than 35 kg/m2? 0  Age over 71 years old? 1  Neck circumference greater than 40 cm/18 inches? 0 (17)  Gender: 1  Obstructive Sleep Apnea Score 4  Score 4 or greater  Results sent to PCP

## 2013-05-23 NOTE — Progress Notes (Addendum)
Dr.Jeffrey Ron Parker with last visit within past 2 months   Echo in epic from 2014  Stress test in epic form 2009  Heart cath in epic from 2004  EKG in from 02-06-13  Denies CXR in past yr

## 2013-05-27 ENCOUNTER — Ambulatory Visit (HOSPITAL_COMMUNITY): Payer: Medicare Other | Admitting: Certified Registered"

## 2013-05-27 ENCOUNTER — Encounter (HOSPITAL_COMMUNITY)
Admission: RE | Disposition: A | Discharge status: Home / Self Care | Payer: Self-pay | Source: Ambulatory Visit | Attending: Otolaryngology

## 2013-05-27 ENCOUNTER — Encounter (HOSPITAL_COMMUNITY): Payer: Medicare Other | Admitting: Certified Registered"

## 2013-05-27 ENCOUNTER — Ambulatory Visit (HOSPITAL_COMMUNITY)
Admission: RE | Admit: 2013-05-27 | Discharge: 2013-05-27 | Disposition: A | Discharge status: Home / Self Care | Payer: Medicare Other | Source: Ambulatory Visit | Attending: Otolaryngology | Admitting: Otolaryngology

## 2013-05-27 ENCOUNTER — Encounter (HOSPITAL_COMMUNITY): Payer: Self-pay | Admitting: *Deleted

## 2013-05-27 DIAGNOSIS — Z9641 Presence of insulin pump (external) (internal): Secondary | ICD-10-CM | POA: Insufficient documentation

## 2013-05-27 DIAGNOSIS — Z794 Long term (current) use of insulin: Secondary | ICD-10-CM | POA: Insufficient documentation

## 2013-05-27 DIAGNOSIS — R0989 Other specified symptoms and signs involving the circulatory and respiratory systems: Secondary | ICD-10-CM | POA: Insufficient documentation

## 2013-05-27 DIAGNOSIS — I2510 Atherosclerotic heart disease of native coronary artery without angina pectoris: Secondary | ICD-10-CM | POA: Insufficient documentation

## 2013-05-27 DIAGNOSIS — K648 Other hemorrhoids: Secondary | ICD-10-CM | POA: Insufficient documentation

## 2013-05-27 DIAGNOSIS — K219 Gastro-esophageal reflux disease without esophagitis: Secondary | ICD-10-CM | POA: Insufficient documentation

## 2013-05-27 DIAGNOSIS — J392 Other diseases of pharynx: Secondary | ICD-10-CM | POA: Insufficient documentation

## 2013-05-27 DIAGNOSIS — M109 Gout, unspecified: Secondary | ICD-10-CM | POA: Insufficient documentation

## 2013-05-27 DIAGNOSIS — E1149 Type 2 diabetes mellitus with other diabetic neurological complication: Secondary | ICD-10-CM | POA: Insufficient documentation

## 2013-05-27 DIAGNOSIS — Z860100 Personal history of colon polyps, unspecified: Secondary | ICD-10-CM | POA: Insufficient documentation

## 2013-05-27 DIAGNOSIS — Z87891 Personal history of nicotine dependence: Secondary | ICD-10-CM | POA: Insufficient documentation

## 2013-05-27 DIAGNOSIS — I509 Heart failure, unspecified: Secondary | ICD-10-CM | POA: Insufficient documentation

## 2013-05-27 DIAGNOSIS — R609 Edema, unspecified: Secondary | ICD-10-CM | POA: Insufficient documentation

## 2013-05-27 DIAGNOSIS — E785 Hyperlipidemia, unspecified: Secondary | ICD-10-CM | POA: Insufficient documentation

## 2013-05-27 DIAGNOSIS — Z7982 Long term (current) use of aspirin: Secondary | ICD-10-CM | POA: Insufficient documentation

## 2013-05-27 DIAGNOSIS — M129 Arthropathy, unspecified: Secondary | ICD-10-CM | POA: Insufficient documentation

## 2013-05-27 DIAGNOSIS — E1142 Type 2 diabetes mellitus with diabetic polyneuropathy: Secondary | ICD-10-CM | POA: Insufficient documentation

## 2013-05-27 DIAGNOSIS — I10 Essential (primary) hypertension: Secondary | ICD-10-CM | POA: Insufficient documentation

## 2013-05-27 DIAGNOSIS — J309 Allergic rhinitis, unspecified: Secondary | ICD-10-CM | POA: Insufficient documentation

## 2013-05-27 DIAGNOSIS — I252 Old myocardial infarction: Secondary | ICD-10-CM | POA: Insufficient documentation

## 2013-05-27 HISTORY — PX: POLYPECTOMY: SHX149

## 2013-05-27 LAB — GLUCOSE, CAPILLARY
GLUCOSE-CAPILLARY: 81 mg/dL (ref 70–99)
Glucose-Capillary: 112 mg/dL — ABNORMAL HIGH (ref 70–99)

## 2013-05-27 SURGERY — POLYPECTOMY, NASAL CAVITY
Anesthesia: General | Site: Nose

## 2013-05-27 MED ORDER — SUCCINYLCHOLINE CHLORIDE 20 MG/ML IJ SOLN
INTRAMUSCULAR | Status: DC | PRN
  Administered 2013-05-27: 100 mg via INTRAVENOUS

## 2013-05-27 MED ORDER — PROPOFOL 10 MG/ML IV BOLUS
INTRAVENOUS | Status: AC
  Filled 2013-05-27: qty 20

## 2013-05-27 MED ORDER — FENTANYL CITRATE 0.05 MG/ML IJ SOLN
INTRAMUSCULAR | Status: AC
Start: 2013-05-27 — End: 2013-05-27
  Filled 2013-05-27: qty 5

## 2013-05-27 MED ORDER — ONDANSETRON HCL 4 MG/2ML IJ SOLN
INTRAMUSCULAR | Status: AC
  Filled 2013-05-27: qty 2

## 2013-05-27 MED ORDER — CEFAZOLIN SODIUM-DEXTROSE 2-3 GM-% IV SOLR
INTRAVENOUS | Status: AC
  Filled 2013-05-27: qty 50

## 2013-05-27 MED ORDER — EPHEDRINE SULFATE 50 MG/ML IJ SOLN
INTRAMUSCULAR | Status: DC | PRN
  Administered 2013-05-27: 5 mg via INTRAVENOUS

## 2013-05-27 MED ORDER — LIDOCAINE HCL (CARDIAC) 20 MG/ML IV SOLN
INTRAVENOUS | Status: AC
  Filled 2013-05-27: qty 5

## 2013-05-27 MED ORDER — LIDOCAINE HCL (CARDIAC) 20 MG/ML IV SOLN
INTRAVENOUS | Status: DC | PRN
  Administered 2013-05-27: 100 mg via INTRATRACHEAL

## 2013-05-27 MED ORDER — SODIUM CHLORIDE 0.9 % IJ SOLN
INTRAMUSCULAR | Status: AC
  Filled 2013-05-27: qty 10

## 2013-05-27 MED ORDER — EPHEDRINE SULFATE 50 MG/ML IJ SOLN
INTRAMUSCULAR | Status: AC
  Filled 2013-05-27: qty 1

## 2013-05-27 MED ORDER — PHENYLEPHRINE 40 MCG/ML (10ML) SYRINGE FOR IV PUSH (FOR BLOOD PRESSURE SUPPORT)
PREFILLED_SYRINGE | INTRAVENOUS | Status: AC
  Filled 2013-05-27: qty 10

## 2013-05-27 MED ORDER — PROPOFOL 10 MG/ML IV BOLUS
INTRAVENOUS | Status: DC | PRN
  Administered 2013-05-27: 200 mg via INTRAVENOUS

## 2013-05-27 MED ORDER — ONDANSETRON HCL 4 MG/2ML IJ SOLN: 4.0000 mg | Freq: Once | INTRAMUSCULAR | Status: DC | PRN

## 2013-05-27 MED ORDER — SODIUM CHLORIDE 0.9 % IR SOLN
Status: DC | PRN
  Administered 2013-05-27: 1000 mL

## 2013-05-27 MED ORDER — ONDANSETRON HCL 4 MG/2ML IJ SOLN
INTRAMUSCULAR | Status: DC | PRN
  Administered 2013-05-27: 4 mg via INTRAVENOUS

## 2013-05-27 MED ORDER — ACETAMINOPHEN 160 MG/5ML PO SOLN
325.0000 mg | ORAL | Status: DC | PRN
  Filled 2013-05-27: qty 20.3

## 2013-05-27 MED ORDER — LACTATED RINGERS IV SOLN
INTRAVENOUS | Status: DC | PRN
  Administered 2013-05-27: 07:00:00 via INTRAVENOUS

## 2013-05-27 MED ORDER — OXYCODONE HCL 5 MG PO TABS: 5.0000 mg | ORAL_TABLET | Freq: Once | ORAL | Status: DC | PRN

## 2013-05-27 MED ORDER — CEFAZOLIN SODIUM-DEXTROSE 2-3 GM-% IV SOLR
2.0000 g | Freq: Once | INTRAVENOUS | Status: AC
  Administered 2013-05-27: 2 g via INTRAVENOUS
  Filled 2013-05-27: qty 50

## 2013-05-27 MED ORDER — FENTANYL CITRATE 0.05 MG/ML IJ SOLN: 25.0000 ug | INTRAMUSCULAR | Status: DC | PRN

## 2013-05-27 MED ORDER — ACETAMINOPHEN 325 MG PO TABS: 325.0000 mg | ORAL_TABLET | ORAL | Status: DC | PRN

## 2013-05-27 MED ORDER — LIDOCAINE-EPINEPHRINE 1 %-1:100000 IJ SOLN
INTRAMUSCULAR | Status: AC
  Filled 2013-05-27: qty 1

## 2013-05-27 MED ORDER — AMOXICILLIN-POT CLAVULANATE 500-125 MG PO TABS: 1.0000 | ORAL_TABLET | Freq: Two times a day (BID) | ORAL | Status: DC

## 2013-05-27 MED ORDER — ROCURONIUM BROMIDE 50 MG/5ML IV SOLN
INTRAVENOUS | Status: AC
  Filled 2013-05-27: qty 1

## 2013-05-27 MED ORDER — OXYCODONE HCL 5 MG/5ML PO SOLN: 5.0000 mg | Freq: Once | ORAL | Status: DC | PRN

## 2013-05-27 MED ORDER — SUCCINYLCHOLINE CHLORIDE 20 MG/ML IJ SOLN
INTRAMUSCULAR | Status: AC
  Filled 2013-05-27: qty 1

## 2013-05-27 MED ORDER — MIDAZOLAM HCL 2 MG/2ML IJ SOLN
INTRAMUSCULAR | Status: AC
  Filled 2013-05-27: qty 2

## 2013-05-27 MED ORDER — FENTANYL CITRATE 0.05 MG/ML IJ SOLN
INTRAMUSCULAR | Status: DC | PRN
  Administered 2013-05-27 (×2): 50 ug via INTRAVENOUS

## 2013-05-27 MED ORDER — GLYCOPYRROLATE 0.2 MG/ML IJ SOLN
INTRAMUSCULAR | Status: AC
  Filled 2013-05-27: qty 1

## 2013-05-27 MED ORDER — OXYMETAZOLINE HCL 0.05 % NA SOLN
NASAL | Status: AC
  Filled 2013-05-27: qty 15

## 2013-05-27 MED ORDER — OXYMETAZOLINE HCL 0.05 % NA SOLN
NASAL | Status: DC | PRN
  Administered 2013-05-27: 1 via NASAL

## 2013-05-27 SURGICAL SUPPLY — 35 items
BLADE RAD40 ROTATE 4M 4 5PK (BLADE) IMPLANT
BLADE RAD60 ROTATE M4 4 5PK (BLADE) IMPLANT
BLADE TRICUT ROTATE M4 4 5PK (BLADE) IMPLANT
CANISTER SUCTION 2500CC (MISCELLANEOUS) ×2 IMPLANT
CATH ROBINSON RED A/P 10FR (CATHETERS) ×2 IMPLANT
COAGULATOR SUCT SWTCH 10FR 6 (ELECTROSURGICAL) IMPLANT
CONT SPEC STER OR (MISCELLANEOUS) ×2 IMPLANT
DRESSING NASAL KENNEDY 3.5X.9 (MISCELLANEOUS) IMPLANT
DRESSING TELFA 8X3 (GAUZE/BANDAGES/DRESSINGS) ×2 IMPLANT
DRSG NASAL KENNEDY 3.5X.9 (MISCELLANEOUS)
ELECT REM PT RETURN 9FT ADLT (ELECTROSURGICAL) ×2
ELECTRODE REM PT RTRN 9FT ADLT (ELECTROSURGICAL) ×1 IMPLANT
FILTER ARTHROSCOPY CONVERTOR (FILTER) IMPLANT
GLOVE BIOGEL M 7.0 STRL (GLOVE) ×2 IMPLANT
GLOVE BIOGEL PI IND STRL 7.0 (GLOVE) ×1 IMPLANT
GLOVE BIOGEL PI INDICATOR 7.0 (GLOVE) ×1
GLOVE SURG SS PI 7.0 STRL IVOR (GLOVE) ×2 IMPLANT
GOWN STRL REUS W/ TWL LRG LVL3 (GOWN DISPOSABLE) ×2 IMPLANT
GOWN STRL REUS W/TWL LRG LVL3 (GOWN DISPOSABLE) ×2
KIT BASIN OR (CUSTOM PROCEDURE TRAY) ×2 IMPLANT
KIT ROOM TURNOVER OR (KITS) ×2 IMPLANT
NS IRRIG 1000ML POUR BTL (IV SOLUTION) ×2 IMPLANT
PAD ARMBOARD 7.5X6 YLW CONV (MISCELLANEOUS) ×4 IMPLANT
SPECIMEN JAR SMALL (MISCELLANEOUS) IMPLANT
SPONGE NEURO XRAY DETECT 1X3 (DISPOSABLE) ×2 IMPLANT
SUT ETHILON 3 0 PS 1 (SUTURE) IMPLANT
SUT PLAIN 4 0 ~~LOC~~ 1 (SUTURE) IMPLANT
SWAB COLLECTION DEVICE MRSA (MISCELLANEOUS) IMPLANT
SYR 50ML SLIP (SYRINGE) IMPLANT
TOWEL OR 17X24 6PK STRL BLUE (TOWEL DISPOSABLE) IMPLANT
TOWEL OR 17X26 10 PK STRL BLUE (TOWEL DISPOSABLE) ×2 IMPLANT
TRAY ENT MC OR (CUSTOM PROCEDURE TRAY) ×2 IMPLANT
TUBE ANAEROBIC SPECIMEN COL (MISCELLANEOUS) IMPLANT
TUBE CONNECTING 12X1/4 (SUCTIONS) ×2 IMPLANT
WATER STERILE IRR 1000ML POUR (IV SOLUTION) IMPLANT

## 2013-05-27 NOTE — Anesthesia Postprocedure Evaluation (Signed)
  Anesthesia Post-op Note  Patient: Jeffrey [name]  Procedure(s) Performed: Procedure(s): ENDOSCOPIC NASOPHARYNGEAL MASS (N/A)  Patient Location: PACU  Anesthesia Type:General  Level of Consciousness: awake, alert  and oriented  Airway and Oxygen Therapy: Patient Spontanous Breathing  Post-op Pain: none  Post-op Assessment: Post-op Vital signs reviewed, Patient's Cardiovascular Status Stable, Respiratory Function Stable, Patent Airway, No signs of Nausea or vomiting and Pain level controlled  Post-op Vital Signs: Reviewed and stable  Complications: No apparent anesthesia complications

## 2013-05-27 NOTE — Discharge Instructions (Signed)

## 2013-05-27 NOTE — Preoperative (Signed)
Beta Blockers   Reason not to administer Beta Blockers:Not Applicable 

## 2013-05-27 NOTE — Transfer of Care (Signed)
Immediate Anesthesia Transfer of Care Note  Patient: Jeffrey [name]  Procedure(s) Performed: Procedure(s): ENDOSCOPIC NASOPHARYNGEAL MASS (N/A)  Patient Location: PACU  Anesthesia Type:General  Level of Consciousness: awake, alert , oriented and patient cooperative  Airway & Oxygen Therapy: Patient Spontanous Breathing  Post-op Assessment: Report given to PACU RN and Post -op Vital signs reviewed and stable  Post vital signs: Reviewed  Complications: No apparent anesthesia complications

## 2013-05-27 NOTE — H&P (Signed)
Jeffrey [name] is an 73 y.o. male.   Chief Complaint: Nasopharyngeal mass HPI: Nasopharyngeal mass, asymptomatic. Nonsmoker  Past Medical History  Diagnosis Date  . CAD (coronary artery disease)     PCI distal RCA ...2004, residual 70% LAD   /   ...nuclear...03/2007...no ischemia.Marland KitchenMarland Kitchenpreserved LV /  nuclear...03/03/2009...inferior scar..no ischemia..EF 51%  . Dyslipidemia     takes Atorvastatin daily  . Internal hemorrhoids   . Gout     takes Allopurinol daily and Colchicine as needed  . Seasonal allergic rhinitis   . Right carotid bruit   . Cyst of nasopharynx     per ENT Wilburn Cornelia  . Myocardial infarction 73yrs ago  . Peripheral edema     takes Lasix daily  . HTN (hypertension)     takes Cardura,Metoprolol,[name],and Amlodipine daily  . Arthritis   . GERD (gastroesophageal reflux disease)     takes Omeprazole daily  . History of colon polyps   . Type 2 diabetes, uncontrolled, with neuropathy 1989    takes [place] daily and has an insulin pump    Past Surgical History  Procedure Laterality Date  . Cataract extraction Bilateral 2010  . Elbow surgery Right 1997  . Balloon angioplasty, artery  1992, 2004    CAD, Dr. Ron Parker  . Colonoscopy  04/03/2002    adenomatous polyp, int hemorrhoids  . Colonoscopy  07/18/2007    normal (Dr. Fuller Plan)  . Rotator cuff repair  09/2011    left, with subacromial decompression  . Nasal septum surgery    . Colonoscopy  09/26/2012    tubular adenoma, sm int hem, rpt 5 yrs Fuller Plan)  . Tonsillectomy and adenoidectomy    . Eye lids raised    . Cardiac catheterization  2004    Family History  Problem Relation Age of Onset  . Alcohol abuse Father   . Coronary artery disease Neg Hx   . Stroke Neg Hx   . Cancer Neg Hx   . Diabetes Neg Hx   . Colon cancer Neg Hx    Social History:  reports that he has quit smoking. He has never used smokeless tobacco. He reports that he does not drink alcohol or use illicit drugs.  Allergies:  Allergies   Allergen Reactions  . Niacin Other (See Comments)    REACTION: intolerant,not allergic="flushing,hot flashes,turning red"    Medications Prior to Admission  Medication Sig Dispense Refill  . allopurinol (ZYLOPRIM) 300 MG tablet Take 300 mg by mouth daily.      [name] amLODipine (NORVASC) 10 MG tablet Take 10 mg by mouth daily.        [name] aspirin 81 MG tablet Take 81 mg by mouth daily.        [name] atorvastatin (LIPITOR) 40 MG tablet Take 40 mg by mouth daily.      . Canagliflozin (INVOKANA) 300 MG TABS Take 1 tablet (300 mg total) by mouth daily.  30 tablet    . cholecalciferol (VITAMIN D) 1000 UNITS tablet Take 1,000 Units by mouth 2 (two) times daily.      . colchicine 0.6 MG tablet Take 0.6 mg by mouth daily as needed. May take 2 for gout flare followed by one twice daily until flare subsided      . doxazosin (CARDURA) 4 MG tablet Take 4 mg by mouth 2 (two) times daily.       . fosinopril ([name]) 40 MG tablet Take 40 mg by mouth daily.        [name]  furosemide (LASIX) 40 MG tablet Take 40 mg by mouth daily.       . Insulin Infusion Pump (H-TRON V100 INSULIN PUMP) MISC Patient uses Novolog in pump.      . metoprolol (LOPRESSOR) 50 MG tablet Take 50 mg by mouth 2 (two) times daily.        [name] omeprazole (PRILOSEC) 40 MG capsule Take 40 mg by mouth daily.       . potassium chloride SA (K-DUR,KLOR-CON) 20 MEQ tablet Take 40 mEq by mouth 2 (two) times daily.        Results for orders placed during the hospital encounter of 05/27/13 (from the past 48 hour(s))  GLUCOSE, CAPILLARY     Status: Abnormal   Collection Time    05/27/13  6:43 AM      Result Value Ref Range   Glucose-Capillary 112 (*) 70 - 99 mg/dL   No results found.  Review of Systems  Constitutional: Negative.   HENT: Negative.   Respiratory: Negative.   Cardiovascular: Negative.   Gastrointestinal: Negative.     Blood pressure 130/73, pulse 61, temperature 97.6 F (36.4 C), temperature source Oral, resp. rate 20, weight 92.08 kg  (203 lb), SpO2 97.00%. Physical Exam  Constitutional: He is oriented to person, place, and time. He appears well-developed and well-nourished.  Eyes: Pupils are equal, round, and reactive to light.  Neck: Normal range of motion. Neck supple.  Cardiovascular: Normal rate.   Respiratory: Effort normal.  Musculoskeletal: Normal range of motion.  Neurological: He is alert and oriented to person, place, and time.     Assessment/Plan Adm for OP biopsy of NP mass  Chelsy Parrales 05/27/2013, 7:21 AM

## 2013-05-27 NOTE — Anesthesia Preprocedure Evaluation (Addendum)
Anesthesia Evaluation  Patient identified by MRN, date of birth, ID band Patient awake    Reviewed: Allergy & Precautions, H&P , NPO status , Patient's Chart, lab work & pertinent test results, reviewed documented beta blocker date and time   History of Anesthesia Complications Negative for: history of anesthetic complications  Airway Mallampati: II TM Distance: >3 FB Neck ROM: Full    Dental  (+) Teeth Intact, Dental Advisory Given   Pulmonary neg shortness of breath, neg sleep apnea, neg COPDneg recent URI, former smoker,  breath sounds clear to auscultation        Cardiovascular hypertension, Pt. on medications and Pt. on home beta blockers - angina+ CAD and + Past MI - CHF and - DOE - dysrhythmias - Valvular Problems/MurmursRhythm:Regular Rate:Normal     Neuro/Psych negative neurological ROS     GI/Hepatic Neg liver ROS, GERD-  Controlled,  Endo/Other  diabetes, Well Controlled, Insulin Dependent  Renal/GU negative Renal ROS     Musculoskeletal   Abdominal   Peds  Hematology negative hematology ROS (+)   Anesthesia Other Findings   Reproductive/Obstetrics                          Anesthesia Physical Anesthesia Plan  ASA: III  Anesthesia Plan: General   Post-op Pain Management:    Induction: Intravenous  Airway Management Planned: Oral ETT  Additional Equipment: None  Intra-op Plan:   Post-operative Plan: Extubation in OR  Informed Consent: I have reviewed the patients History and Physical, chart, labs and discussed the procedure including the risks, benefits and alternatives for the proposed anesthesia with the patient or authorized representative who has indicated his/her understanding and acceptance.   Dental advisory given  Plan Discussed with: CRNA and Surgeon  Anesthesia Plan Comments:         Anesthesia Quick Evaluation

## 2013-05-27 NOTE — Op Note (Signed)
Jeffrey [name], Jeffrey [name]               ACCOUNT NO.:  [id number]  MEDICAL RECORD NO.:  [id number]  LOCATION:  [facility]                         FACILITY:  [facility]  PHYSICIAN:  Early Chars. Wilburn Cornelia, M.D.DATE OF BIRTH:  26-Aug-1940  DATE OF PROCEDURE:  05/27/2013 DATE OF DISCHARGE:                              OPERATIVE REPORT   PREOPERATIVE DIAGNOSIS:  Asymptomatic nasopharyngeal mass.  POSTOPERATIVE DIAGNOSIS:  Asymptomatic nasopharyngeal mass.  INDICATION FOR SURGERY:  Asymptomatic nasopharyngeal mass.  SURGICAL PROCEDURE:  Biopsy nasopharyngeal mass.  ANESTHESIA:  General endotracheal.  SURGEON:  Early Chars. Wilburn Cornelia, MD  COMPLICATIONS:  None.  ESTIMATED BLOOD LOSS:  Minimal.  The patient transferred from the operating room to the recovery room in stable condition.  BRIEF HISTORY:  The patient is a 73 year old white male, who has been followed in our office with a history of chronic hoarseness.  On routine flexible laryngoscopy, a mucosal covered soft tissue mass was noted in the nasopharynx.  The patient was monitored and followed up 3 months later with gradual enlargement of the mass.  No other symptoms.  No bleeding or ulcer.  No lesion.  A CT scan of the nasopharynx and neck was obtained and the radiologist felt this was consistent with a solid nasopharyngeal mass.  No evidence of cyst or infection.  No adenopathy or other finding.  Given the patient's history and examination, I recommended biopsy of the mass under general anesthesia for tissue diagnosis.  The risks and benefits of procedure were discussed in detail with the patient and his wife.  They understood and concurred with our plan for surgery which was scheduled on an elective basis at [place].  DESCRIPTION OF PROCEDURE:  The patient was brought to the operating room on May 27, 2013, placed in supine position on the operating table. General endotracheal anesthesia was established without difficulty.   The nasopharynx was visualized through the oropharynx using a Crowe-Davis mouth gag which was inserted without difficulty.  There were no loose or broken teeth.  The nasopharynx was examined indirectly with mirror exam and a mucosal covered soft tissue fullness was noted.  Using the curved [facility] forceps multiple biopsies were obtained.  Moderate amount of mucoid material was expressed.  Findings were consistent with a possible nasopharyngeal cyst.  There was minimal bleeding.  Biopsy specimens were sent to Pathology for gross microscopic evaluation.  The patient's nasopharynx and oral cavity were irrigated and suctioned.  The orogastric tube was passed and stomach contents were aspirated.  Mouth gag was removed without difficulty.  No loose or broken teeth.  No bleeding.  The patient was awakened, extubated, and then transferred from the operating room to the recovery room in stable condition.  There were no complications.  No blood loss.          ______________________________ Early Chars Wilburn Cornelia, M.D.     DLS/MEDQ  D:  68/34/1960  T:  05/27/2013  Job:  [id number]

## 2013-05-27 NOTE — Progress Notes (Signed)
Dr. Wilburn Cornelia has not  Signed orders. Consent not signed. Pt. Has an insulin pump states at basal rate. [id number] at [phone].

## 2013-05-27 NOTE — Brief Op Note (Signed)
05/27/2013  8:06 AM  PATIENT:  Jeffrey [name]  73 y.o. male  PRE-OPERATIVE DIAGNOSIS:  nasopharyngeal mass  POST-OPERATIVE DIAGNOSIS:  * No post-op diagnosis entered *  PROCEDURE:  Procedure(s): ENDOSCOPIC NASOPHARYNGEAL MASS (N/A)  SURGEON:  Surgeon(s) and Role:    * [name], MD - Primary  PHYSICIAN ASSISTANT:   ASSISTANTS: none   ANESTHESIA:   general  EBL:  Total I/O In: 500 [I.V.:500] Out: - Min  BLOOD ADMINISTERED:none  DRAINS: none   LOCAL MEDICATIONS USED:  NONE  SPECIMEN:  No Specimen and Source of Specimen:  Nasopharyngeal mass  DISPOSITION OF SPECIMEN:  PATHOLOGY  COUNTS:  YES  TOURNIQUET:  * No tourniquets in log *  DICTATION: .Other Dictation: Dictation Number [id number]  PLAN OF CARE: Discharge to home after PACU  PATIENT DISPOSITION:  PACU - hemodynamically stable.   Delay start of Pharmacological VTE agent (>24hrs) due to surgical blood loss or risk of bleeding: no

## 2013-05-27 NOTE — Addendum Note (Signed)
Addended by: Lucas Winograd on: 05/27/2013 09:54 AM   Modules accepted: Orders  

## 2013-05-27 NOTE — Anesthesia Procedure Notes (Signed)
Procedure Name: Intubation Date/Time: 05/27/2013 7:41 AM Performed by: Jenne Campus Pre-anesthesia Checklist: Patient identified, Emergency Drugs available, Suction available, Patient being monitored and Timeout performed Patient Re-evaluated:Patient Re-evaluated prior to inductionOxygen Delivery Method: Circle system utilized Preoxygenation: Pre-oxygenation with 100% oxygen Intubation Type: IV induction Ventilation: Mask ventilation without difficulty and Oral airway inserted - appropriate to patient size Laryngoscope Size: Miller and 2 Grade View: Grade I Tube type: Oral Tube size: 7.5 mm Number of attempts: 1 Airway Equipment and Method: Stylet Placement Confirmation: ETT inserted through vocal cords under direct vision,  positive ETCO2,  CO2 detector and breath sounds checked- equal and bilateral Secured at: 21 cm Tube secured with: Tape Dental Injury: Teeth and Oropharynx as per pre-operative assessment

## 2013-05-28 ENCOUNTER — Encounter (HOSPITAL_COMMUNITY): Payer: Self-pay | Admitting: Otolaryngology

## 2013-05-30 ENCOUNTER — Encounter: Payer: Self-pay | Admitting: Family Medicine

## 2013-06-13 ENCOUNTER — Other Ambulatory Visit: Payer: Self-pay

## 2013-07-18 ENCOUNTER — Encounter: Payer: Self-pay | Admitting: Family Medicine

## 2013-09-30 LAB — COMPLETE METABOLIC PANEL WITH GFR
ALK PHOS: 132 U/L
ALT: 34
AST: 24 U/L
Creat: 1.24
Total Bilirubin: 1 mg/dL

## 2013-09-30 LAB — LIPID PANEL
CHOLESTEROL: 202 mg/dL — AB (ref 0–200)
CHOLESTEROL: 202 mg/dL — AB (ref 0–200)
CHOLESTEROL: 202 mg/dL — AB (ref 0–200)
HDL: 35 mg/dL (ref 35–70)
HDL: 35 mg/dL (ref 35–70)
HDL: 35 mg/dL (ref 35–70)
LDL (calc): 98
LDL CALC: 98
LDL Cholesterol: 98 mg/dL
TRIGLYCERIDES: 342
TRIGLYCERIDES: 342 mg/dL — AB (ref 40–160)
Triglycerides: 342

## 2013-09-30 LAB — TSH
TSH: 0.97
TSH: 0.97 u[IU]/mL (ref <=5.90)

## 2013-09-30 LAB — HEMOGLOBIN A1C
A1C: 7.3
HEMOGLOBIN A1C: 7.3 % — AB (ref 4.0–6.0)

## 2013-09-30 LAB — MICROALBUMIN, URINE

## 2013-09-30 LAB — CREATININE CLEARANCE
ALT: 34
AST: 24 U/L
Alkaline Phosphatase: 132 U/L
CREATININE: 1.24
Total Bilirubin: 1 mg/dL

## 2013-09-30 LAB — BASIC METABOLIC PANEL: CREATININE: 1.2 mg/dL (ref <=1.3)

## 2013-10-04 ENCOUNTER — Encounter: Payer: Self-pay | Admitting: *Deleted

## 2014-01-12 ENCOUNTER — Encounter: Payer: Self-pay | Admitting: Family Medicine

## 2014-01-21 LAB — HM DIABETES EYE EXAM

## 2014-04-28 ENCOUNTER — Encounter: Payer: Self-pay | Admitting: Family Medicine

## 2014-04-28 ENCOUNTER — Ambulatory Visit (INDEPENDENT_AMBULATORY_CARE_PROVIDER_SITE_OTHER): Payer: Medicare Other | Admitting: Family Medicine

## 2014-04-28 VITALS — BP 130/64 | HR 62 | Temp 97.8°F | Ht 68.0 in | Wt 200.1 lb

## 2014-04-28 DIAGNOSIS — J302 Other seasonal allergic rhinitis: Secondary | ICD-10-CM

## 2014-04-28 DIAGNOSIS — H8110 Benign paroxysmal vertigo, unspecified ear: Secondary | ICD-10-CM

## 2014-04-28 MED ORDER — FLUTICASONE PROPIONATE 50 MCG/ACT NA SUSP: 2.0000 | Freq: Every day | NASAL | Status: DC

## 2014-04-28 MED ORDER — MECLIZINE HCL 25 MG PO TABS: 25.0000 mg | ORAL_TABLET | Freq: Three times a day (TID) | ORAL | Status: DC | PRN

## 2014-04-28 NOTE — Assessment & Plan Note (Signed)
Intermittent and positional  Disc use of meclizine prn and disc safety/fall pref Will tx sinus cong/ETD with flonase If not imp in 2 wk -consider ref to ENT Disc PT as well

## 2014-04-28 NOTE — Patient Instructions (Signed)
I think you have positional vertigo It may be related to chronic sinus problems Start flonase nasal spray every day  Use meclizine as needed for dizziness - (caution of sedation)  Move slowly-especially when turning head   If not improved in 2 weeks - let us know

## 2014-04-28 NOTE — Assessment & Plan Note (Signed)
?   If chronic congestion and ETD are perpetuating vertigo  Will try flonase ns 2 sprays each nostril once daily Disc poss side eff Will update if not imp in 2 wk

## 2014-04-28 NOTE — Progress Notes (Signed)
Pre visit review using our clinic review tool, if applicable. No additional management support is needed unless otherwise documented below in the visit note. 

## 2014-04-28 NOTE — Progress Notes (Signed)
Subjective:    Patient ID: Jeffrey [name], male    DOB: 08-14-1940, 74 y.o.   MRN: [id number]  HPI Here for vertigo   Started having trouble with this about 6 months ago  On and off- would wake up dizzy (correlated to nasal stuffiness)  Severe episode 3-4 weeks ago - put him "on the floor" (and he vomited)- really felt like he was spinning  A few mild cases since then One this am   Has not seen a doctor for it  PCP is Dr Darnell Level  Right now - he feels better (just washed out)  This am - woke up and set up - felt spinning- like he "wants to sway" - was able to get up and get moving  Did not fall  Lately - sinuses are clogged and ears feel full -on and off  Did have deviated septum fixed years ago   No current allergy meds No dramamine or other meds   Blood glucose control is fair -not perfect   No carotid vasc dz that he knows of -was tested    Patient Active Problem List   Diagnosis Date Noted  . 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 Right carotid bruit   . Polyneuropathy, diabetic 08/05/2012  . Seasonal allergic rhinitis   . Trigger thumb of left hand 10/12/2011  . Acute chest wall pain 06/01/2011  . Epigastric abdominal pain 11/11/2010  . Gout   . Adenomatous polyps   . CAD (coronary artery disease)   . HTN (hypertension)   . Dyslipidemia   . Type 2 diabetes, uncontrolled, with neuropathy   . Ejection fraction    Past Medical History  Diagnosis Date  . CAD (coronary artery disease)     PCI distal RCA ...2004, residual 70% LAD   /   ...nuclear...03/2007...no ischemia.Marland KitchenMarland Kitchenpreserved LV /  nuclear...03/03/2009...inferior scar..no ischemia..EF 51%  . Dyslipidemia     takes Atorvastatin daily  . Internal hemorrhoids   . Gout     takes Allopurinol daily and Colchicine as needed  . Seasonal allergic rhinitis   . Right carotid bruit   . Cyst of nasopharynx     per ENT Wilburn Cornelia  . Myocardial infarction 87yrs ago  . Peripheral edema     takes Lasix daily  . HTN  (hypertension)     takes Cardura,Metoprolol,[name],and Amlodipine daily  . Arthritis   . GERD (gastroesophageal reflux disease)     takes Omeprazole daily  . History of colon polyps   . Type 2 diabetes, uncontrolled, with neuropathy 1989    takes Invokana daily and has an insulin pump (Dr. [name])  . 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 Cataract extraction Bilateral 2010  . Elbow surgery Right 1997  . Balloon angioplasty, artery  1992, 2004    CAD, Dr. Ron Parker  . Colonoscopy  04/03/2002    adenomatous polyp, int hemorrhoids  . Colonoscopy  07/18/2007    normal (Dr. Fuller Plan)  . Rotator cuff repair  09/2011    left, with subacromial decompression  . Nasal septum surgery    . Colonoscopy  09/26/2012    tubular adenoma, sm int hem, rpt 5 yrs Fuller Plan)  . Tonsillectomy and adenoidectomy    . Eye lids raised    . Cardiac catheterization  2004  . Polypectomy N/A 05/27/2013    Procedure: ENDOSCOPIC NASOPHARYNGEAL MASS;  Surgeon: [name], MD  History  Substance Use Topics  . Smoking status: Former Research scientist (life sciences)  . Smokeless tobacco: Never Used     Comment: quit smoking 45 years ago.  . Alcohol Use: No   Family History  Problem Relation Age of Onset  . Alcohol abuse Father   . Coronary artery disease Neg Hx   . Stroke Neg Hx   . Cancer Neg Hx   . Diabetes Neg Hx   . Colon cancer Neg Hx    Allergies  Allergen Reactions  . Niacin Other (See Comments)    REACTION: intolerant,not allergic="flushing,hot flashes,turning red"   Current Outpatient Prescriptions on File Prior to Visit  Medication Sig Dispense Refill  . allopurinol (ZYLOPRIM) 300 MG tablet Take 300 mg by mouth daily.    [name] amLODipine (NORVASC) 10 MG tablet Take 10 mg by mouth daily.      [name] aspirin 81 MG tablet Take 81 mg by mouth daily.      [name] atorvastatin (LIPITOR) 40 MG tablet Take 40 mg by mouth daily.    .  Canagliflozin (INVOKANA) 300 MG TABS Take 1 tablet (300 mg total) by mouth daily. 30 tablet   . cholecalciferol (VITAMIN D) 1000 UNITS tablet Take 1,000 Units by mouth 2 (two) times daily.    . colchicine 0.6 MG tablet Take 0.6 mg by mouth daily as needed. May take 2 for gout flare followed by one twice daily until flare subsided    . doxazosin (CARDURA) 4 MG tablet Take 4 mg by mouth 2 (two) times daily.     . fosinopril ([name]) 40 MG tablet Take 40 mg by mouth daily.      . furosemide (LASIX) 40 MG tablet Take 40 mg by mouth daily.     . Insulin Infusion Pump (H-TRON V100 INSULIN PUMP) MISC Patient uses Novolog in pump.    . metoprolol (LOPRESSOR) 50 MG tablet Take 50 mg by mouth 2 (two) times daily.      . potassium chloride SA (K-DUR,KLOR-CON) 20 MEQ tablet Take 40 mEq by mouth 2 (two) times daily.    . [DISCONTINUED] pantoprazole (PROTONIX) 40 MG tablet Take 40 mg by mouth daily.      No current facility-administered medications on file prior to visit.     Review of Systems Review of Systems  Constitutional: Negative for fever, appetite change, fatigue and unexpected weight change.  Eyes: Negative for pain and visual disturbance.  ENT pos for chronic cong/pos for sens of ear fullness  Respiratory: Negative for cough and shortness of breath.   Cardiovascular: Negative for cp or palpitations    Gastrointestinal: Negative for nausea, diarrhea and constipation.  Genitourinary: Negative for urgency and frequency.  Skin: Negative for pallor or rash   Neurological: Negative for weakness,  numbness and headaches. neg for facial droop or slurring  Hematological: Negative for adenopathy. Does not bruise/bleed easily.  Psychiatric/Behavioral: Negative for dysphoric mood. The patient is not nervous/anxious.         Objective:   Physical Exam  Constitutional: He appears well-developed and well-nourished. No distress.  HENT:  Head: Normocephalic and atraumatic.  Right Ear: External ear  normal.  Left Ear: External ear normal.  Mouth/Throat: Oropharynx is clear and moist.  Nares are boggy and congested No sinus tenderness  TMs are dull but clear   Eyes: Conjunctivae and EOM are normal. Pupils are equal, round, and reactive to light. Right eye exhibits no discharge. Left eye exhibits no discharge. No scleral icterus.  3-4 beats of  R nystagmus, 2 beats of L nystagmus (horizontal)  Neck: Normal range of motion. Neck supple. No JVD present. Carotid bruit is not present. No thyromegaly present.  Cardiovascular: Normal rate, regular rhythm, normal heart sounds and intact distal pulses.  Exam reveals no gallop.   Pulmonary/Chest: Effort normal and breath sounds normal. No respiratory distress. He has no wheezes. He exhibits no tenderness.  Musculoskeletal: He exhibits no edema or tenderness.  Lymphadenopathy:    He has no cervical adenopathy.  Neurological: He is alert. He has normal strength and normal reflexes. He displays no atrophy and no tremor. No cranial nerve deficit or sensory deficit. He exhibits normal muscle tone. He displays a negative Romberg sign. Coordination and gait normal.  Skin: Skin is warm and dry. No rash noted. No erythema. No pallor.  Psychiatric: He has a normal mood and affect.          Assessment & Plan:   Problem List Items Addressed This Visit      Respiratory   Seasonal allergic rhinitis    ? If chronic congestion and ETD are perpetuating vertigo  Will try flonase ns 2 sprays each nostril once daily Disc poss side eff Will update if not imp in 2 wk        Nervous and Auditory   Benign paroxysmal positional vertigo - Primary    Intermittent and positional  Disc use of meclizine prn and disc safety/fall pref Will tx sinus cong/ETD with flonase If not imp in 2 wk -consider ref to ENT Disc PT as well

## 2014-04-29 LAB — HEMOGLOBIN A1C: HEMOGLOBIN A1C: 7.8 % — AB (ref 4.0–6.0)

## 2014-04-29 LAB — HM DIABETES FOOT EXAM: HM Diabetic Foot Exam: DECREASED

## 2014-05-05 ENCOUNTER — Ambulatory Visit (INDEPENDENT_AMBULATORY_CARE_PROVIDER_SITE_OTHER): Payer: Medicare Other | Admitting: Cardiology

## 2014-05-05 ENCOUNTER — Encounter: Payer: Self-pay | Admitting: Cardiology

## 2014-05-05 VITALS — BP 126/72 | HR 59 | Ht 68.0 in

## 2014-05-05 DIAGNOSIS — I10 Essential (primary) hypertension: Secondary | ICD-10-CM

## 2014-05-05 DIAGNOSIS — I159 Secondary hypertension, unspecified: Secondary | ICD-10-CM

## 2014-05-05 DIAGNOSIS — I2510 Atherosclerotic heart disease of native coronary artery without angina pectoris: Secondary | ICD-10-CM

## 2014-05-05 DIAGNOSIS — E785 Hyperlipidemia, unspecified: Secondary | ICD-10-CM

## 2014-05-05 NOTE — Patient Instructions (Signed)
**Note De-identified Jeffrey [name] Obfuscation** Your physician recommends that you continue on your current medications as directed. Please refer to the Current Medication list given to you today.  Your physician wants you to follow-up in: 1 year. You will receive a reminder letter in the mail two months in advance. If you don't receive a letter, please call our office to schedule the follow-up appointment.  

## 2014-05-05 NOTE — Assessment & Plan Note (Signed)
Coronary disease is stable. No further workup. Consideration could be given to a follow-up stress study next year.

## 2014-05-05 NOTE — Assessment & Plan Note (Signed)
Blood pressures controlled. No change in therapy. 

## 2014-05-05 NOTE — Progress Notes (Signed)
Cardiology Office Note   Date:  05/05/2014   ID:  Terrie, Grajales [birth year], MRN [id number]  PCP:  [name], MD  Cardiologist:  Dola Argyle, MD   Chief Complaint  Patient presents with  . Appointment    Follow-up coronary disease      History of Present Illness: Jeffrey [name] is a 74 y.o. male who presents to follow-up coronary artery disease. I saw him last December, 2014. He's not having any chest pain. He is retired and doing well.    Past Medical History  Diagnosis Date  . CAD (coronary artery disease)     PCI distal RCA ...2004, residual 70% LAD   /   ...nuclear...03/2007...no ischemia.Marland KitchenMarland Kitchenpreserved LV /  nuclear...03/03/2009...inferior scar..no ischemia..EF 51%  . Dyslipidemia     takes Atorvastatin daily  . Internal hemorrhoids   . Gout     takes Allopurinol daily and Colchicine as needed  . Seasonal allergic rhinitis   . Right carotid bruit   . Cyst of nasopharynx     per ENT Wilburn Cornelia  . Myocardial infarction 15yrs ago  . Peripheral edema     takes Lasix daily  . HTN (hypertension)     takes Cardura,Metoprolol,[name],and Amlodipine daily  . Arthritis   . GERD (gastroesophageal reflux disease)     takes Omeprazole daily  . History of colon polyps   . Type 2 diabetes, uncontrolled, with neuropathy 1989    takes Invokana daily and has an insulin pump (Dr. [name])  . 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 Cataract extraction Bilateral 2010  . Elbow surgery Right 1997  . Balloon angioplasty, artery  1992, 2004    CAD, Dr. Ron Parker  . Colonoscopy  04/03/2002    adenomatous polyp, int hemorrhoids  . Colonoscopy  07/18/2007    normal (Dr. Fuller Plan)  . Rotator cuff repair  09/2011    left, with subacromial decompression  . Nasal septum surgery    . Colonoscopy  09/26/2012    tubular adenoma, sm int hem, rpt 5 yrs Fuller Plan)  . Tonsillectomy and  adenoidectomy    . Eye lids raised    . Cardiac catheterization  2004  . Polypectomy N/A 05/27/2013    Procedure: ENDOSCOPIC NASOPHARYNGEAL MASS;  Surgeon: [name], MD    Patient Active Problem List   Diagnosis Date Noted  . Benign paroxysmal positional vertigo 04/28/2014  . 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 Right carotid bruit   . Polyneuropathy, diabetic 08/05/2012  . Seasonal allergic rhinitis   . Trigger thumb of left hand 10/12/2011  . Acute chest wall pain 06/01/2011  . Epigastric abdominal pain 11/11/2010  . Gout   . Adenomatous polyps   . CAD (coronary artery disease)   . HTN (hypertension)   . Dyslipidemia   . Type 2 diabetes, uncontrolled, with neuropathy   . Ejection fraction       Current Outpatient Prescriptions  Medication Sig Dispense Refill  . allopurinol (ZYLOPRIM) 300 MG tablet Take 300 mg by mouth daily.    [name] amLODipine (NORVASC) 10 MG tablet Take 10 mg by mouth daily.      [name] aspirin 81 MG tablet Take 81 mg by mouth daily.      [name] atorvastatin (LIPITOR) 40 MG tablet Take 40 mg by mouth daily.    . Canagliflozin (INVOKANA) 300 MG TABS Take 1 tablet (300 mg total)  by mouth daily. 30 tablet   . cholecalciferol (VITAMIN D) 1000 UNITS tablet Take 1,000 Units by mouth 2 (two) times daily.    . colchicine 0.6 MG tablet Take 0.6 mg by mouth daily as needed. May take 2 for gout flare followed by one twice daily until flare subsided    . doxazosin (CARDURA) 4 MG tablet Take 4 mg by mouth 2 (two) times daily.     . fluticasone (FLONASE) 50 MCG/ACT nasal spray Place 2 sprays into both nostrils daily. 16 g 6  . fosinopril ([name]) 40 MG tablet Take 40 mg by mouth daily.      . furosemide (LASIX) 40 MG tablet Take 40 mg by mouth daily.     . Insulin Infusion Pump (H-TRON V100 INSULIN PUMP) MISC Patient uses Novolog in pump.    . meclizine (ANTIVERT) 25 MG tablet Take 1 tablet (25 mg total) by mouth 3 (three) times daily as needed for dizziness (watch for  sedation). 20 tablet 0  . metoprolol (LOPRESSOR) 50 MG tablet Take 50 mg by mouth 2 (two) times daily.      . potassium chloride SA (K-DUR,KLOR-CON) 20 MEQ tablet Take 40 mEq by mouth 2 (two) times daily.    . [DISCONTINUED] pantoprazole (PROTONIX) 40 MG tablet Take 40 mg by mouth daily.      No current facility-administered medications for this visit.    Allergies:   Niacin    Social History:  The patient  reports that he has quit smoking. He has never used smokeless tobacco. He reports that he does not drink alcohol or use illicit drugs.   Family History:  The patient's family history includes Alcohol abuse in his father. There is no history of Coronary artery disease, Stroke, Cancer, Diabetes, or Colon cancer.    ROS:  Please see the history of present illness.  Patient denies fever, chills, headache, sweats, rash, change in vision, change in hearing, chest pain, cough, nausea or vomiting, urinary symptoms. All other systems are reviewed and are negative.      PHYSICAL EXAM: VS:  BP 126/72 mmHg  Pulse 59  Ht 5\' 8"  (1.727 m) , Patient is oriented to person time and place. Affect is normal. Head is atraumatic. Sclera and conjunctiva are normal. There is no jugulovenous distention. Lungs are clear. Respiratory effort is nonlabored. Cardiac exam reveals S1 and S2. Abdomen is soft. There is no peripheral edema. There are no musculoskeletal deformities. There are no skin rashes.  EKG:   EKG is done today and reviewed by me. There is no significant change from the past.   Recent Labs: 05/23/2013: BUN 21; Hemoglobin 15.6; Platelets 147*; Potassium 4.4; Sodium 138 09/30/2013: ALT 34; ALT 34; Creatinine 1.24; Creatinine 1.24; Creatinine 1.2; TSH 0.97; TSH 0.97    Lipid Panel    Component Value Date/Time   CHOL 202* 09/30/2013   CHOL 202* 09/30/2013   CHOL 202* 09/30/2013   CHOL 211 02/01/2011   TRIG 342 09/30/2013   TRIG 342 09/30/2013   TRIG 342* 09/30/2013   TRIG 522 02/01/2011    TRIG 522 02/01/2011   HDL 35 09/30/2013   HDL 35 09/30/2013   HDL 35 09/30/2013   CHOLHDL 4 02/07/2012 1457   VLDL 55.4* 02/07/2012 1457   LDLCALC 98 09/30/2013   LDLCALC 98 09/30/2013   LDLCALC 98 09/30/2013   LDLDIRECT 89.6 02/07/2012 1457      Wt Readings from Last 3 Encounters:  04/28/14 200 lb 1.9 oz (90.774 kg)  05/27/13 203 lb (92.08 kg)  02/06/13 202 lb (91.627 kg)      Current medicines are reviewed       ASSESSMENT AND PLAN:

## 2014-05-05 NOTE — Assessment & Plan Note (Signed)
Patient is receiving guideline directed therapy. No further workup.

## 2014-05-15 ENCOUNTER — Encounter: Payer: Self-pay | Admitting: Family Medicine

## 2014-05-30 ENCOUNTER — Emergency Department (HOSPITAL_COMMUNITY): Payer: Medicare Other

## 2014-05-30 ENCOUNTER — Inpatient Hospital Stay (HOSPITAL_COMMUNITY): Payer: Medicare Other

## 2014-05-30 ENCOUNTER — Inpatient Hospital Stay (HOSPITAL_COMMUNITY)
Admission: EM | Admit: 2014-05-30 | Discharge: 2014-06-03 | DRG: 065 | Disposition: A | Discharge status: Rehabilitation Facility | Payer: Medicare Other | Attending: Neurology | Admitting: Neurology

## 2014-05-30 ENCOUNTER — Encounter (HOSPITAL_COMMUNITY): Payer: Self-pay | Admitting: *Deleted

## 2014-05-30 DIAGNOSIS — R29818 Other symptoms and signs involving the nervous system: Secondary | ICD-10-CM | POA: Diagnosis not present

## 2014-05-30 DIAGNOSIS — Z794 Long term (current) use of insulin: Secondary | ICD-10-CM | POA: Diagnosis not present

## 2014-05-30 DIAGNOSIS — E1159 Type 2 diabetes mellitus with other circulatory complications: Secondary | ICD-10-CM | POA: Diagnosis present

## 2014-05-30 DIAGNOSIS — R0989 Other specified symptoms and signs involving the circulatory and respiratory systems: Secondary | ICD-10-CM | POA: Diagnosis present

## 2014-05-30 DIAGNOSIS — Z79899 Other long term (current) drug therapy: Secondary | ICD-10-CM

## 2014-05-30 DIAGNOSIS — I6789 Other cerebrovascular disease: Secondary | ICD-10-CM | POA: Diagnosis not present

## 2014-05-30 DIAGNOSIS — I2510 Atherosclerotic heart disease of native coronary artery without angina pectoris: Secondary | ICD-10-CM | POA: Diagnosis present

## 2014-05-30 DIAGNOSIS — K219 Gastro-esophageal reflux disease without esophagitis: Secondary | ICD-10-CM | POA: Diagnosis present

## 2014-05-30 DIAGNOSIS — R471 Dysarthria and anarthria: Secondary | ICD-10-CM | POA: Diagnosis present

## 2014-05-30 DIAGNOSIS — D72829 Elevated white blood cell count, unspecified: Secondary | ICD-10-CM | POA: Diagnosis present

## 2014-05-30 DIAGNOSIS — J302 Other seasonal allergic rhinitis: Secondary | ICD-10-CM | POA: Diagnosis present

## 2014-05-30 DIAGNOSIS — E1165 Type 2 diabetes mellitus with hyperglycemia: Secondary | ICD-10-CM | POA: Diagnosis present

## 2014-05-30 DIAGNOSIS — I639 Cerebral infarction, unspecified: Principal | ICD-10-CM | POA: Diagnosis present

## 2014-05-30 DIAGNOSIS — R600 Localized edema: Secondary | ICD-10-CM | POA: Diagnosis present

## 2014-05-30 DIAGNOSIS — Z87891 Personal history of nicotine dependence: Secondary | ICD-10-CM | POA: Diagnosis present

## 2014-05-30 DIAGNOSIS — I252 Old myocardial infarction: Secondary | ICD-10-CM | POA: Diagnosis not present

## 2014-05-30 DIAGNOSIS — M1990 Unspecified osteoarthritis, unspecified site: Secondary | ICD-10-CM | POA: Diagnosis present

## 2014-05-30 DIAGNOSIS — I619 Nontraumatic intracerebral hemorrhage, unspecified: Secondary | ICD-10-CM | POA: Insufficient documentation

## 2014-05-30 DIAGNOSIS — E785 Hyperlipidemia, unspecified: Secondary | ICD-10-CM | POA: Diagnosis present

## 2014-05-30 DIAGNOSIS — F17210 Nicotine dependence, cigarettes, uncomplicated: Secondary | ICD-10-CM | POA: Diagnosis present

## 2014-05-30 DIAGNOSIS — E1140 Type 2 diabetes mellitus with diabetic neuropathy, unspecified: Secondary | ICD-10-CM | POA: Diagnosis present

## 2014-05-30 DIAGNOSIS — I10 Essential (primary) hypertension: Secondary | ICD-10-CM | POA: Diagnosis present

## 2014-05-30 DIAGNOSIS — I629 Nontraumatic intracranial hemorrhage, unspecified: Secondary | ICD-10-CM | POA: Diagnosis present

## 2014-05-30 DIAGNOSIS — I615 Nontraumatic intracerebral hemorrhage, intraventricular: Secondary | ICD-10-CM | POA: Diagnosis present

## 2014-05-30 DIAGNOSIS — E669 Obesity, unspecified: Secondary | ICD-10-CM | POA: Diagnosis present

## 2014-05-30 DIAGNOSIS — I152 Hypertension secondary to endocrine disorders: Secondary | ICD-10-CM | POA: Diagnosis present

## 2014-05-30 DIAGNOSIS — M109 Gout, unspecified: Secondary | ICD-10-CM | POA: Diagnosis present

## 2014-05-30 DIAGNOSIS — Z888 Allergy status to other drugs, medicaments and biological substances status: Secondary | ICD-10-CM | POA: Diagnosis not present

## 2014-05-30 DIAGNOSIS — E876 Hypokalemia: Secondary | ICD-10-CM | POA: Diagnosis present

## 2014-05-30 DIAGNOSIS — G8191 Hemiplegia, unspecified affecting right dominant side: Secondary | ICD-10-CM | POA: Diagnosis present

## 2014-05-30 DIAGNOSIS — Z86010 Personal history of colonic polyps: Secondary | ICD-10-CM

## 2014-05-30 DIAGNOSIS — Z720 Tobacco use: Secondary | ICD-10-CM | POA: Diagnosis not present

## 2014-05-30 DIAGNOSIS — Z6830 Body mass index (BMI) 30.0-30.9, adult: Secondary | ICD-10-CM | POA: Diagnosis not present

## 2014-05-30 DIAGNOSIS — Z0000 Encounter for general adult medical examination without abnormal findings: Secondary | ICD-10-CM

## 2014-05-30 DIAGNOSIS — R918 Other nonspecific abnormal finding of lung field: Secondary | ICD-10-CM | POA: Diagnosis not present

## 2014-05-30 DIAGNOSIS — E1169 Type 2 diabetes mellitus with other specified complication: Secondary | ICD-10-CM | POA: Diagnosis present

## 2014-05-30 DIAGNOSIS — Z7982 Long term (current) use of aspirin: Secondary | ICD-10-CM

## 2014-05-30 DIAGNOSIS — I69151 Hemiplegia and hemiparesis following nontraumatic intracerebral hemorrhage affecting right dominant side: Secondary | ICD-10-CM | POA: Diagnosis not present

## 2014-05-30 DIAGNOSIS — E1149 Type 2 diabetes mellitus with other diabetic neurological complication: Secondary | ICD-10-CM | POA: Diagnosis present

## 2014-05-30 DIAGNOSIS — I618 Other nontraumatic intracerebral hemorrhage: Secondary | ICD-10-CM | POA: Diagnosis not present

## 2014-05-30 LAB — COMPREHENSIVE METABOLIC PANEL
ALBUMIN: 3.8 g/dL (ref 3.5–5.2)
ALK PHOS: 95 U/L (ref 39–117)
ALT: 35 U/L (ref 0–53)
ANION GAP: 14 (ref 5–15)
AST: 74 U/L — AB (ref 0–37)
BUN: 20 mg/dL (ref 6–23)
CALCIUM: 9.5 mg/dL (ref 8.4–10.5)
CO2: 20 mmol/L (ref 19–32)
Chloride: 110 mmol/L (ref 96–112)
Creatinine, Ser: 1.09 mg/dL (ref 0.50–1.35)
GFR calc non Af Amer: 66 mL/min — ABNORMAL LOW (ref >90)
GFR, EST AFRICAN AMERICAN: 77 mL/min — AB (ref >90)
Glucose, Bld: 140 mg/dL — ABNORMAL HIGH (ref 70–99)
Potassium: 3.3 mmol/L — ABNORMAL LOW (ref 3.5–5.1)
SODIUM: 144 mmol/L (ref 135–145)
TOTAL PROTEIN: 6.8 g/dL (ref 6.0–8.3)
Total Bilirubin: 1 mg/dL (ref 0.3–1.2)

## 2014-05-30 LAB — CBC
HEMATOCRIT: 42.8 % (ref 39.0–52.0)
HEMOGLOBIN: 15 g/dL (ref 13.0–17.0)
MCH: 31.2 pg (ref 26.0–34.0)
MCHC: 35 g/dL (ref 30.0–36.0)
MCV: 89 fL (ref 78.0–100.0)
PLATELETS: 142 10*3/uL — AB (ref 150–400)
RBC: 4.81 MIL/uL (ref 4.22–5.81)
RDW: 14 % (ref 11.5–15.5)
WBC: 11.8 10*3/uL — AB (ref 4.0–10.5)

## 2014-05-30 LAB — URINALYSIS, ROUTINE W REFLEX MICROSCOPIC
Bilirubin Urine: NEGATIVE
Ketones, ur: 40 mg/dL — AB
LEUKOCYTES UA: NEGATIVE
Nitrite: NEGATIVE
Protein, ur: NEGATIVE mg/dL
Specific Gravity, Urine: 1.02 (ref 1.005–1.030)
Urobilinogen, UA: 0.2 mg/dL (ref 0.0–1.0)
pH: 5.5 (ref 5.0–8.0)

## 2014-05-30 LAB — I-STAT CHEM 8, ED
BUN: 22 mg/dL (ref 6–23)
CREATININE: 0.8 mg/dL (ref 0.50–1.35)
Calcium, Ion: 1.18 mmol/L (ref 1.13–1.30)
Chloride: 110 mmol/L (ref 96–112)
GLUCOSE: 139 mg/dL — AB (ref 70–99)
HCT: 44 % (ref 39.0–52.0)
Hemoglobin: 15 g/dL (ref 13.0–17.0)
POTASSIUM: 3.4 mmol/L — AB (ref 3.5–5.1)
SODIUM: 145 mmol/L (ref 135–145)
TCO2: 17 mmol/L (ref 0–100)

## 2014-05-30 LAB — DIFFERENTIAL
Basophils Absolute: 0 10*3/uL (ref 0.0–0.1)
Basophils Relative: 0 % (ref 0–1)
Eosinophils Absolute: 0 10*3/uL (ref 0.0–0.7)
Eosinophils Relative: 0 % (ref 0–5)
LYMPHS ABS: 0.4 10*3/uL — AB (ref 0.7–4.0)
LYMPHS PCT: 3 % — AB (ref 12–46)
MONO ABS: 0.8 10*3/uL (ref 0.1–1.0)
MONOS PCT: 6 % (ref 3–12)
NEUTROS ABS: 10.7 10*3/uL — AB (ref 1.7–7.7)
NEUTROS PCT: 91 % — AB (ref 43–77)

## 2014-05-30 LAB — RAPID URINE DRUG SCREEN, HOSP PERFORMED
Amphetamines: NOT DETECTED
BENZODIAZEPINES: NOT DETECTED
Barbiturates: NOT DETECTED
COCAINE: NOT DETECTED
Opiates: NOT DETECTED
Tetrahydrocannabinol: NOT DETECTED

## 2014-05-30 LAB — URINE MICROSCOPIC-ADD ON

## 2014-05-30 LAB — GLUCOSE, CAPILLARY
Glucose-Capillary: 180 mg/dL — ABNORMAL HIGH (ref 70–99)
Glucose-Capillary: 140 mg/dL — ABNORMAL HIGH (ref 70–99)
Glucose-Capillary: 127 mg/dL — ABNORMAL HIGH (ref 70–99)

## 2014-05-30 LAB — CBG MONITORING, ED: GLUCOSE-CAPILLARY: 142 mg/dL — AB (ref 70–99)

## 2014-05-30 LAB — APTT: APTT: 27 s (ref 24–37)

## 2014-05-30 LAB — PROTIME-INR
INR: 1.16 (ref 0.00–1.49)
Prothrombin Time: 14.9 seconds (ref 11.6–15.2)

## 2014-05-30 LAB — I-STAT TROPONIN, ED: TROPONIN I, POC: 0.02 ng/mL (ref 0.00–0.08)

## 2014-05-30 LAB — ETHANOL: Alcohol, Ethyl (B): <5 mg/dL (ref 0–9)

## 2014-05-30 LAB — MRSA PCR SCREENING: MRSA by PCR: NEGATIVE

## 2014-05-30 MED ORDER — CHLORHEXIDINE GLUCONATE 0.12 % MT SOLN
15.0000 mL | Freq: Two times a day (BID) | OROMUCOSAL | Status: DC
  Administered 2014-05-30 – 2014-06-03 (×7): 15 mL via OROMUCOSAL
  Filled 2014-05-30 (×8): qty 15

## 2014-05-30 MED ORDER — LABETALOL HCL 5 MG/ML IV SOLN: 10.0000 mg | INTRAVENOUS | Status: DC | PRN

## 2014-05-30 MED ORDER — STROKE: EARLY STAGES OF RECOVERY BOOK
Freq: Once | Status: AC
  Administered 2014-05-30: 16:00:00
  Filled 2014-05-30 (×2): qty 1

## 2014-05-30 MED ORDER — ACETAMINOPHEN 650 MG RE SUPP: 650.0000 mg | RECTAL | Status: DC | PRN

## 2014-05-30 MED ORDER — INSULIN ASPART 100 UNIT/ML ~~LOC~~ SOLN
0.0000 [IU] | Freq: Three times a day (TID) | SUBCUTANEOUS | Status: DC
  Administered 2014-05-30 – 2014-05-31 (×2): 1 [IU] via SUBCUTANEOUS
  Administered 2014-05-31: 2 [IU] via SUBCUTANEOUS
  Administered 2014-05-31 – 2014-06-01 (×3): 1 [IU] via SUBCUTANEOUS
  Administered 2014-06-01 – 2014-06-02 (×2): 2 [IU] via SUBCUTANEOUS
  Administered 2014-06-02: 1 [IU] via SUBCUTANEOUS
  Administered 2014-06-02 – 2014-06-03 (×3): 2 [IU] via SUBCUTANEOUS

## 2014-05-30 MED ORDER — CETYLPYRIDINIUM CHLORIDE 0.05 % MT LIQD
7.0000 mL | Freq: Two times a day (BID) | OROMUCOSAL | Status: DC
  Administered 2014-05-30 – 2014-06-02 (×7): 7 mL via OROMUCOSAL

## 2014-05-30 MED ORDER — SENNOSIDES-DOCUSATE SODIUM 8.6-50 MG PO TABS
1.0000 | ORAL_TABLET | Freq: Two times a day (BID) | ORAL | Status: DC
  Administered 2014-05-31 – 2014-06-03 (×4): 1 via ORAL
  Filled 2014-05-30 (×8): qty 1

## 2014-05-30 MED ORDER — ACETAMINOPHEN 325 MG PO TABS
650.0000 mg | ORAL_TABLET | ORAL | Status: DC | PRN
  Administered 2014-05-31 – 2014-06-02 (×4): 650 mg via ORAL
  Filled 2014-05-30 (×4): qty 2

## 2014-05-30 MED ORDER — PANTOPRAZOLE SODIUM 40 MG IV SOLR
40.0000 mg | Freq: Every day | INTRAVENOUS | Status: DC
  Administered 2014-05-30 – 2014-05-31 (×2): 40 mg via INTRAVENOUS
  Filled 2014-05-30 (×3): qty 40

## 2014-05-30 NOTE — Progress Notes (Signed)
*  PRELIMINARY RESULTS* Vascular Ultrasound Carotid Duplex (Doppler) has been completed.  Preliminary findings: Bilateral:  1-39% ICA stenosis.  Vertebral artery flow is antegrade.      Landry Mellow, RDMS, RVT  05/30/2014, 5:48 PM

## 2014-05-30 NOTE — ED Notes (Signed)
Pt given a urinal, unable to stay awake enough to void. Possible incontinence episode prior to given a urinal.

## 2014-05-30 NOTE — Progress Notes (Signed)
Utilization review completed.  

## 2014-05-30 NOTE — H&P (Signed)
Admission H&P    Chief Complaint: New onset right-sided weakness.  HPI: Jeffrey [name] is an 74 y.o. male with a history of hypertension, hyperlipidemia, coronary artery disease and diabetes mellitus, brought to the emergency room after being found lying in his 80 at home. He was noted to have right-sided weakness and speech. He also appeared to be somewhat confused. Code stroke was activated. It's not clear exactly when he was last known well, however. CT scan of his head showed an acute left thalamic and internal capsular ridge with intraventricular extension. Code stroke was subsequently canceled. NIH stroke score was 11.  LSN: Unclear. tPA Given: No: ICH, IVH mRankin:  Past Medical History  Diagnosis Date  . CAD (coronary artery disease)     PCI distal RCA ...2004, residual 70% LAD   /   ...nuclear...03/2007...no ischemia.Marland KitchenMarland Kitchenpreserved LV /  nuclear...03/03/2009...inferior scar..no ischemia..EF 51%  . Dyslipidemia     takes Atorvastatin daily  . Internal hemorrhoids   . Gout     takes Allopurinol daily and Colchicine as needed  . Seasonal allergic rhinitis   . Right carotid bruit   . Cyst of nasopharynx     per ENT Wilburn Cornelia  . Myocardial infarction 3yrs ago  . Peripheral edema     takes Lasix daily  . HTN (hypertension)     takes Cardura,Metoprolol,[name],and Amlodipine daily  . Arthritis   . GERD (gastroesophageal reflux disease)     takes Omeprazole daily  . History of colon polyps   . Type 2 diabetes, uncontrolled, with neuropathy 1989    takes Invokana daily and has an insulin pump (Dr. [name])  . 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 Cataract extraction Bilateral 2010  . Elbow surgery Right 1997  . Balloon angioplasty, artery  1992, 2004    CAD, Dr. Ron Parker  . Colonoscopy  04/03/2002    adenomatous polyp, int hemorrhoids  . Colonoscopy  07/18/2007    normal  (Dr. Fuller Plan)  . Rotator cuff repair  09/2011    left, with subacromial decompression  . Nasal septum surgery    . Colonoscopy  09/26/2012    tubular adenoma, sm int hem, rpt 5 yrs Fuller Plan)  . Tonsillectomy and adenoidectomy    . Eye lids raised    . Cardiac catheterization  2004  . Polypectomy N/A 05/27/2013    Procedure: ENDOSCOPIC NASOPHARYNGEAL MASS;  Surgeon: [name], MD    Family History  Problem Relation Age of Onset  . Alcohol abuse Father   . Coronary artery disease Neg Hx   . Stroke Neg Hx   . Cancer Neg Hx   . Diabetes Neg Hx   . Colon cancer Neg Hx    Social History:  reports that he has quit smoking. He has never used smokeless tobacco. He reports that he does not drink alcohol or use illicit drugs.  Allergies:  Allergies  Allergen Reactions  . Niacin Other (See Comments)    REACTION: intolerant,not allergic="flushing,hot flashes,turning red"    Medications: Patient's preadmission medications were reviewed by me.  ROS: History obtained from the patient  General ROS: negative for - chills, fatigue, fever, night sweats, weight gain or weight loss Psychological ROS: negative for - behavioral disorder, hallucinations, memory difficulties, mood swings or suicidal ideation Ophthalmic ROS: negative for - blurry vision, double vision, eye pain or loss of vision ENT ROS: negative for -  epistaxis, nasal discharge, oral lesions, sore throat, tinnitus or vertigo Allergy and Immunology ROS: negative for - hives or itchy/watery eyes Hematological and Lymphatic ROS: negative for - bleeding problems, bruising or swollen lymph nodes Endocrine ROS: negative for - galactorrhea, hair pattern changes, polydipsia/polyuria or temperature intolerance Respiratory ROS: negative for - cough, hemoptysis, shortness of breath or wheezing Cardiovascular ROS: negative for - chest pain, dyspnea on exertion, edema or irregular heartbeat Gastrointestinal ROS: negative for - abdominal pain,  diarrhea, hematemesis, nausea/vomiting or stool incontinence Genito-Urinary ROS: negative for - dysuria, hematuria, incontinence or urinary frequency/urgency Musculoskeletal ROS: negative for - joint swelling or muscular weakness Neurological ROS: as noted in HPI Dermatological ROS: negative for rash and skin lesion changes  Physical Examination: Blood pressure 133/68, pulse 104, resp. rate 21, SpO2 98 %.  HEENT-  Normocephalic, no lesions, without obvious abnormality.  Normal external eye and conjunctiva.  Normal TM's bilaterally.  Normal auditory canals and external ears. Normal external nose, mucus membranes and septum.  Normal pharynx. Neck supple with no masses, nodes, nodules or enlargement. Cardiovascular - regular rate and rhythm, S1, S2 normal, no murmur, click, rub or gallop Lungs - chest clear, no wheezing, rales, normal symmetric air entry Abdomen - soft, non-tender; bowel sounds normal; no masses,  no organomegaly Extremities - no joint deformities, effusion, or inflammation and no edema  Neurologic Examination: Mental Status: Somewhat lethargic, oriented, no acute distress.  He had moderate expressive aphasia with mild receptive aphasia. Able to follow commands with use of left extremities. Cranial Nerves: II-Visual fields were normal. III/IV/VI-Pupils were equal and reacted. Extraocular movements were full and conjugate.    V/VII-no facial numbness; equivocal mild right lower facial weakness. VIII-normal. [name] was mildly dysarthric. XI: trapezius strength/neck flexion strength normal bilaterally XII-midline tongue extension with normal strength. Motor: Severe weakness proximally and distally of right upper and lower extremities; muscle tone was normal on the left and flaccid on the right. Sensory: Normal throughout. Deep Tendon Reflexes: 2+ and symmetric. Plantars: Extensor on the right and mute on the left. Cerebellar: Normal finger-to-nose testing with use of left  upper extremity. Carotid auscultation: Normal  Results for orders placed or performed during the hospital encounter of 05/30/14 (from the past 48 hour(s))  Ethanol     Status: None   Collection Time: 05/30/14  7:14 AM  Result Value Ref Range   Alcohol, Ethyl (B) <5 0 - 9 mg/dL    Comment:        LOWEST DETECTABLE LIMIT FOR SERUM ALCOHOL IS 11 mg/dL FOR MEDICAL PURPOSES ONLY   Protime-INR     Status: None   Collection Time: 05/30/14  7:14 AM  Result Value Ref Range   Prothrombin Time 14.9 11.6 - 15.2 seconds   INR 1.16 0.00 - 1.49  APTT     Status: None   Collection Time: 05/30/14  7:14 AM  Result Value Ref Range   aPTT 27 24 - 37 seconds  CBC     Status: Abnormal   Collection Time: 05/30/14  7:14 AM  Result Value Ref Range   WBC 11.8 (H) 4.0 - 10.5 K/uL   RBC 4.81 4.22 - 5.81 MIL/uL   Hemoglobin 15.0 13.0 - 17.0 g/dL   HCT 42.8 39.0 - 52.0 %   MCV 89.0 78.0 - 100.0 fL   MCH 31.2 26.0 - 34.0 pg   MCHC 35.0 30.0 - 36.0 g/dL   RDW 14.0 11.5 - 15.5 %   Platelets 142 (L) 150 - 400  K/uL  Differential     Status: Abnormal   Collection Time: 05/30/14  7:14 AM  Result Value Ref Range   Neutrophils Relative % 91 (H) 43 - 77 %   Neutro Abs 10.7 (H) 1.7 - 7.7 K/uL   Lymphocytes Relative 3 (L) 12 - 46 %   Lymphs Abs 0.4 (L) 0.7 - 4.0 K/uL   Monocytes Relative 6 3 - 12 %   Monocytes Absolute 0.8 0.1 - 1.0 K/uL   Eosinophils Relative 0 0 - 5 %   Eosinophils Absolute 0.0 0.0 - 0.7 K/uL   Basophils Relative 0 0 - 1 %   Basophils Absolute 0.0 0.0 - 0.1 K/uL  Comprehensive metabolic panel     Status: Abnormal   Collection Time: 05/30/14  7:14 AM  Result Value Ref Range   Sodium 144 135 - 145 mmol/L   Potassium 3.3 (L) 3.5 - 5.1 mmol/L   Chloride 110 96 - 112 mmol/L   CO2 20 19 - 32 mmol/L   Glucose, Bld 140 (H) 70 - 99 mg/dL   BUN 20 6 - 23 mg/dL   Creatinine, Ser 1.09 0.50 - 1.35 mg/dL   Calcium 9.5 8.4 - 10.5 mg/dL   Total Protein 6.8 6.0 - 8.3 g/dL   Albumin 3.8 3.5 - 5.2  g/dL   AST 74 (H) 0 - 37 U/L   ALT 35 0 - 53 U/L   Alkaline Phosphatase 95 39 - 117 U/L   Total Bilirubin 1.0 0.3 - 1.2 mg/dL   GFR calc non Af Amer 66 (L) >90 mL/min   GFR calc Af Amer 77 (L) >90 mL/min    Comment: (NOTE) The eGFR has been calculated using the CKD EPI equation. This calculation has not been validated in all clinical situations. eGFR's persistently <90 mL/min signify possible Chronic Kidney Disease.    Anion gap 14 5 - 15  CBG monitoring, ED     Status: Abnormal   Collection Time: 05/30/14  7:16 AM  Result Value Ref Range   Glucose-Capillary 142 (H) 70 - 99 mg/dL  I-Stat Troponin, ED (not at [facility])     Status: None   Collection Time: 05/30/14  7:23 AM  Result Value Ref Range   Troponin i, poc 0.02 0.00 - 0.08 ng/mL   Comment 3            Comment: Due to the release kinetics of cTnI, a negative result within the first hours of the onset of symptoms does not rule out myocardial infarction with certainty. If myocardial infarction is still suspected, repeat the test at appropriate intervals.   I-Stat Chem 8, ED     Status: Abnormal   Collection Time: 05/30/14  7:31 AM  Result Value Ref Range   Sodium 145 135 - 145 mmol/L   Potassium 3.4 (L) 3.5 - 5.1 mmol/L   Chloride 110 96 - 112 mmol/L   BUN 22 6 - 23 mg/dL   Creatinine, Ser 0.80 0.50 - 1.35 mg/dL   Glucose, Bld 139 (H) 70 - 99 mg/dL   Calcium, Ion 1.18 1.13 - 1.30 mmol/L   TCO2 17 0 - 100 mmol/L   Hemoglobin 15.0 13.0 - 17.0 g/dL   HCT 44.0 39.0 - 52.0 %   Ct Head (brain) Wo Contrast  05/30/2014   CLINICAL DATA:  Patient found down.  Possible stroke.  Code stroke.  EXAM: CT HEAD WITHOUT CONTRAST  TECHNIQUE: Contiguous axial images were obtained from the base of the skull  through the vertex without intravenous contrast.  COMPARISON:  05/14/2013.  FINDINGS: There is an acute LEFT thalamic hemorrhage extending to the LEFT internal capsule and there is intraventricular break through, with blood in both lateral  ventricles, greater on the LEFT than RIGHT. No hydrocephalus. The parenchymal portion of the hemorrhage measures 26 mm transverse by 13 mm AP. This is present on 6 contiguous images with estimated 30 mm craniocaudal dimension. Tiny amount of blood is also present in the third ventricle and fourth ventricle.  Bilateral lens extractions are present. Chronic dystrophic calcification is present in the RIGHT cerebellar hemisphere, unchanged compared to 05/14/2013. Gray-white differentiation is preserved. Calvarium intact. Paranasal sinuses and mastoid air cells normal. Mild motion artifact is present, most evident on the bone windows. Scout images appear within normal limits.  IMPRESSION: 1. Acute LEFT thalamic and LEFT internal capsule parenchymal hemorrhage compatible with hypertensive hemorrhage. Intraventricular break through, with blood in both lateral ventricles, third and fourth ventricles. 2. No hydrocephalus. 3. Critical Value/emergent results were called by telephone at the time of interpretation on 05/30/2014 at 7:33 am to Dr. Nicole Kindred, who verbally acknowledged these results.   Electronically Signed   By: Dereck Ligas M.D.   On: 05/30/2014 07:34   Ct Cervical Spine Wo Contrast  05/30/2014   CLINICAL DATA:  Code stroke. Left alignment hemorrhage. Found down.  EXAM: CT CERVICAL SPINE WITHOUT CONTRAST  TECHNIQUE: Multidetector CT imaging of the cervical spine was performed without intravenous contrast. Multiplanar CT image reconstructions were also generated.  COMPARISON:  CT of the neck 05/14/2013  FINDINGS: Cervical spine is imaged from the skullbase through T2-3. There is calcification in the disc and fusion across the endplates at B1-4 and N8-2. The posterior elements are fused at these levels as well. Slight degenerative anterolisthesis is present at C4-5. More prominent chronic endplate changes are noted at C5-6 and C6-7 with asymmetric left-sided uncovertebral spurring an osseous foraminal narrowing at  these levels.  The previously noted nasopharyngeal nodule is no longer present. And 8 mm hypodense lesion in the right lobe of the thyroid was not evident on the postcontrast images previously. There is some hypoattenuation in the left lobe of the thyroid as well. Atherosclerotic calcifications are present at the aortic arch and to lesser extent carotid bifurcations.  IMPRESSION: 1. No acute fracture or traumatic subluxation. 2. Multilevel spondylosis of the cervical spine. 3. Anterior and posterior fusion at C2-3 and C3-4 is likely acquired. 4. Osseous foramina are scratch the osseous foraminal narrowing is most pronounced on the left at C5-6 and C6-7. 5. Bilateral sub cm thyroid nodules. Sub-centimeter thyroid nodule(s) noted, too small to characterize, but most likely benign in the absence of known clinical risk factors for thyroid carcinoma.   Electronically Signed   By: San Morelle M.D.   On: 05/30/2014 08:35     Assessment: 74 y.o. male with multiple risk factors for stroke presenting with acute left thalamic and internal capsular hemorrhage with intraventricular extension.  Stroke Risk Factors - diabetes mellitus, hyperlipidemia and hypertension  Plan: 1. HgbA1c, fasting lipid panel 2. MRI, MRA  of the brain without contrast 3. PT consult, OT consult, Speech consult 4. Echocardiogram 5. Carotid dopplers 6. Prophylactic therapy-None 7. Repeat CT scan of the head in the a.m. to rule out extension of intracranial hemorrhage 8. Telemetry monitoring  C.R. Nicole Kindred, MD [facility] [phone] 05/30/2014, 8:55 AM

## 2014-05-30 NOTE — ED Notes (Signed)
Family to bedside and updated

## 2014-05-30 NOTE — ED Notes (Signed)
Patient arrived from home via [facility] this morning after being found in car port by Newspaper delivery person. EMS responded to the home and found patient lying on back with KED in place via FD. At that time patient was not able to provide history, was able to accurately state name only. Upon arrival to ED This RN noted that patient seemed to be neglecting his right arm, and required left arm to hold up right arm; strong grip noted on left with no response on right hand; right leg showed some effort against gravity, but unable to lift leg entirely. MD Horton informed and at bedside to evaluate.

## 2014-05-30 NOTE — ED Provider Notes (Addendum)
Patient assessed by me at 6:58 AM is a possible code stroke.   Her EMS report, patient was found by the paper boy in his carotids. He was noted to be confused.  He was also noted to have some right upper extremity weakness. Patient states that he went to bed last night with his pajamas on. He does not know what happened this morning. He was noted to have normal flow is on. He states he normally wakes up at 4 AM. He cannot provide any other information. Last seen normal is unknown but he states he went to bed last night normal. Physical exam notable for right upper extremity weakness both proximal and with grip strength. Patient also noted to have right lower extremity weakness which is less profound. He spontaneously moves the right lower extremity but is unable to keep his leg up. Patient noted to have contusion and hematoma to the lip. Code stroke initiated.   Merryl Hacker, MD 05/30/14 [id number]  Merryl Hacker, MD 05/30/14 [phone]

## 2014-05-30 NOTE — ED Provider Notes (Signed)
CSN: [id number]     Arrival date & time 05/30/14  8502 History   First MD Initiated Contact with Patient 05/30/14 [phone]     Chief Complaint  Patient presents with  . Code Stroke     (Consider location/radiation/quality/duration/timing/severity/associated sxs/prior Treatment) HPI  Patient assessed by me at 6:58 AM as a possible code stroke.   Her EMS report, patient was found by the paper boy in his garage. He was noted to be confused.  He was also noted to have some right upper extremity weakness. Patient states that he went to bed last night with his pajamas on. He does not know what happened this morning. He was noted to have normal day clothes on evaluation. He states he normally wakes up at 4 AM. He cannot provide any other information. Last seen normal is unknown but he states he went to bed last night normal. Physical exam notable for right upper extremity weakness both proximal and with grip strength. Patient also noted to have right lower extremity weakness which is less profound. He spontaneously moves the right lower extremity but is unable to keep his leg up. Patient noted to have contusion and hematoma to the lip. Code stroke initiated.  Level 5 caveat for acuity of condition  Past Medical History  Diagnosis Date  . CAD (coronary artery disease)     PCI distal RCA ...2004, residual 70% LAD   /   ...nuclear...03/2007...no ischemia.Marland KitchenMarland Kitchenpreserved LV /  nuclear...03/03/2009...inferior scar..no ischemia..EF 51%  . Dyslipidemia     takes Atorvastatin daily  . Internal hemorrhoids   . Gout     takes Allopurinol daily and Colchicine as needed  . Seasonal allergic rhinitis   . Right carotid bruit   . Cyst of nasopharynx     per ENT Wilburn Cornelia  . Myocardial infarction 3yrs ago  . Peripheral edema     takes Lasix daily  . HTN (hypertension)     takes Cardura,Metoprolol,[name],and Amlodipine daily  . Arthritis   . GERD (gastroesophageal reflux disease)     takes Omeprazole daily   . History of colon polyps   . Type 2 diabetes, uncontrolled, with neuropathy 1989    takes Invokana daily and has an insulin pump (Dr. [name])  . 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 Cataract extraction Bilateral 2010  . Elbow surgery Right 1997  . Balloon angioplasty, artery  1992, 2004    CAD, Dr. Ron Parker  . Colonoscopy  04/03/2002    adenomatous polyp, int hemorrhoids  . Colonoscopy  07/18/2007    normal (Dr. Fuller Plan)  . Rotator cuff repair  09/2011    left, with subacromial decompression  . Nasal septum surgery    . Colonoscopy  09/26/2012    tubular adenoma, sm int hem, rpt 5 yrs Fuller Plan)  . Tonsillectomy and adenoidectomy    . Eye lids raised    . Cardiac catheterization  2004  . Polypectomy N/A 05/27/2013    Procedure: ENDOSCOPIC NASOPHARYNGEAL MASS;  Surgeon: [name], MD   Family History  Problem Relation Age of Onset  . Alcohol abuse Father   . Coronary artery disease Neg Hx   . Stroke Neg Hx   . Cancer Neg Hx   . Diabetes Neg Hx   . Colon cancer Neg Hx    History  Substance Use Topics  . Smoking status: Former Research scientist (life sciences)  . Smokeless tobacco: Never  Used     Comment: quit smoking 45 years ago.  . Alcohol Use: No    Review of Systems  Unable to perform ROS: Acuity of condition      Allergies  Niacin  Home Medications   Prior to Admission medications   Medication Sig Start Date End Date Taking? Authorizing Provider  allopurinol (ZYLOPRIM) 300 MG tablet Take 300 mg by mouth daily.    Historical Provider, MD  amLODipine (NORVASC) 10 MG tablet Take 10 mg by mouth daily.      Historical Provider, MD  aspirin 81 MG tablet Take 81 mg by mouth daily.      Historical Provider, MD  atorvastatin (LIPITOR) 40 MG tablet Take 40 mg by mouth daily.    Historical Provider, MD  Canagliflozin (INVOKANA) 300 MG TABS Take 1 tablet (300 mg total) by mouth daily. 01/20/13    [name], MD  cholecalciferol (VITAMIN D) 1000 UNITS tablet Take 1,000 Units by mouth 2 (two) times daily.    Historical Provider, MD  colchicine 0.6 MG tablet Take 0.6 mg by mouth daily as needed. May take 2 for gout flare followed by one twice daily until flare subsided    [name], MD  doxazosin (CARDURA) 4 MG tablet Take 4 mg by mouth 2 (two) times daily.     Historical Provider, MD  fluticasone (FLONASE) 50 MCG/ACT nasal spray Place 2 sprays into both nostrils daily. 04/28/14   Abner Greenspan, MD  fosinopril ([name]) 40 MG tablet Take 40 mg by mouth daily.      Historical Provider, MD  furosemide (LASIX) 40 MG tablet Take 40 mg by mouth daily.     Historical Provider, MD  Insulin Infusion Pump (H-TRON V100 INSULIN PUMP) MISC Patient uses Novolog in pump.    Historical Provider, MD  meclizine (ANTIVERT) 25 MG tablet Take 1 tablet (25 mg total) by mouth 3 (three) times daily as needed for dizziness (watch for sedation). 04/28/14   Abner Greenspan, MD  metoprolol (LOPRESSOR) 50 MG tablet Take 50 mg by mouth 2 (two) times daily.      Historical Provider, MD  potassium chloride SA (K-DUR,KLOR-CON) 20 MEQ tablet Take 40 mEq by mouth 2 (two) times daily.    Historical Provider, MD   BP 140/68 mmHg  Pulse 108  Resp 20  SpO2 97% Physical Exam  Constitutional: He is oriented to person, place, and time.  Confused, somnolent but arousable  HENT:  Head: Normocephalic and atraumatic.  Mouth/Throat: Oropharynx is clear and moist.  Contusion and hematoma to the right lip  Eyes: EOM are normal. Pupils are equal, round, and reactive to light.  Cardiovascular: Normal rate, regular rhythm and normal heart sounds.   No murmur heard. Pulmonary/Chest: Effort normal and breath sounds normal. No respiratory distress. He has no wheezes.  Abdominal: Soft. There is no tenderness.  INsulin pump  Musculoskeletal: He exhibits no edema.  Neurological: He is alert and oriented to person, place, and  time.  Flaccid paralysis on the right with decreased proximal and distal muscle weakness of the arm and leg  Skin: Skin is warm and dry.  Nursing note and vitals reviewed.   ED Course  Procedures (including critical care time)  CRITICAL CARE Performed by: Merryl Hacker   Total critical care time: 35 min  Critical care time was exclusive of separately billable procedures and treating other patients.  Critical care was necessary to treat or prevent imminent or life-threatening deterioration.  Critical  care was time spent personally by me on the following activities: development of treatment plan with patient and/or surrogate as well as nursing, discussions with consultants, evaluation of patient's response to treatment, examination of patient, obtaining history from patient or surrogate, ordering and performing treatments and interventions, ordering and review of laboratory studies, ordering and review of radiographic studies, pulse oximetry and re-evaluation of patient's condition.  Labs Review Labs Reviewed  CBG MONITORING, ED - Abnormal; Notable for the following:    Glucose-Capillary 142 (*)    All other components within normal limits  I-STAT CHEM 8, ED - Abnormal; Notable for the following:    Potassium 3.4 (*)    Glucose, Bld 139 (*)    All other components within normal limits  ETHANOL  PROTIME-INR  APTT  CBC  DIFFERENTIAL  COMPREHENSIVE METABOLIC PANEL  URINE RAPID DRUG SCREEN (HOSP PERFORMED)  URINALYSIS, ROUTINE W REFLEX MICROSCOPIC  I-STAT TROPOININ, ED  I-STAT TROPOININ, ED    Imaging Review Ct Head (brain) Wo Contrast  05/30/2014   CLINICAL DATA:  Patient found down.  Possible stroke.  Code stroke.  EXAM: CT HEAD WITHOUT CONTRAST  TECHNIQUE: Contiguous axial images were obtained from the base of the skull through the vertex without intravenous contrast.  COMPARISON:  05/14/2013.  FINDINGS: There is an acute LEFT thalamic hemorrhage extending to the LEFT  internal capsule and there is intraventricular break through, with blood in both lateral ventricles, greater on the LEFT than RIGHT. No hydrocephalus. The parenchymal portion of the hemorrhage measures 26 mm transverse by 13 mm AP. This is present on 6 contiguous images with estimated 30 mm craniocaudal dimension. Tiny amount of blood is also present in the third ventricle and fourth ventricle.  Bilateral lens extractions are present. Chronic dystrophic calcification is present in the RIGHT cerebellar hemisphere, unchanged compared to 05/14/2013. Gray-white differentiation is preserved. Calvarium intact. Paranasal sinuses and mastoid air cells normal. Mild motion artifact is present, most evident on the bone windows. Scout images appear within normal limits.  IMPRESSION: 1. Acute LEFT thalamic and LEFT internal capsule parenchymal hemorrhage compatible with hypertensive hemorrhage. Intraventricular break through, with blood in both lateral ventricles, third and fourth ventricles. 2. No hydrocephalus. 3. Critical Value/emergent results were called by telephone at the time of interpretation on 05/30/2014 at 7:33 am to Dr. Nicole Kindred, who verbally acknowledged these results.   Electronically Signed   By: Dereck Ligas M.D.   On: 05/30/2014 07:34     EKG Interpretation   Date/Time:  Friday May 30 2014 07:30:23 EDT Ventricular Rate:  109 PR Interval:  192 QRS Duration: 124 QT Interval:  381 QTC Calculation: 513 R Axis:   53 Text Interpretation:  Sinus tachycardia Nonspecific intraventricular  conduction delay Nonspecific repol abnormality, diffuse leads Confirmed by  Emarie Paul  MD, [place] ([id number]) on 05/30/2014 7:33:17 AM      MDM   Final diagnoses:  Hemorrhagic stroke    Patient presents with stroke like symptoms.  Unknown LSN but states that he went to bed with pajamas on and now is in street clothes.  NOrmally awakens at 4 am.  Code stroke intiated.  Neurology at the [facility].  CT with thalamic  hemorrhage.  Patient to be admitted and managed by neurology.    Merryl Hacker, MD 05/30/14 [phone]

## 2014-05-30 NOTE — ED Notes (Signed)
MD Horton advised this RN to call code stroke.

## 2014-05-31 ENCOUNTER — Inpatient Hospital Stay (HOSPITAL_COMMUNITY): Payer: Medicare Other

## 2014-05-31 DIAGNOSIS — I6789 Other cerebrovascular disease: Secondary | ICD-10-CM

## 2014-05-31 LAB — BASIC METABOLIC PANEL
ANION GAP: 10 (ref 5–15)
BUN: 23 mg/dL (ref 6–23)
CO2: 21 mmol/L (ref 19–32)
Calcium: 9.2 mg/dL (ref 8.4–10.5)
Chloride: 115 mmol/L — ABNORMAL HIGH (ref 96–112)
Creatinine, Ser: 1.02 mg/dL (ref 0.50–1.35)
GFR calc Af Amer: 83 mL/min — ABNORMAL LOW (ref >90)
GFR, EST NON AFRICAN AMERICAN: 72 mL/min — AB (ref >90)
Glucose, Bld: 129 mg/dL — ABNORMAL HIGH (ref 70–99)
POTASSIUM: 3.5 mmol/L (ref 3.5–5.1)
Sodium: 146 mmol/L — ABNORMAL HIGH (ref 135–145)

## 2014-05-31 LAB — CBC WITH DIFFERENTIAL/PLATELET
BASOS PCT: 0 % (ref 0–1)
Basophils Absolute: 0 10*3/uL (ref 0.0–0.1)
Eosinophils Absolute: 0 10*3/uL (ref 0.0–0.7)
Eosinophils Relative: 0 % (ref 0–5)
HCT: 44.7 % (ref 39.0–52.0)
HEMOGLOBIN: 14.8 g/dL (ref 13.0–17.0)
Lymphocytes Relative: 16 % (ref 12–46)
Lymphs Abs: 1.6 10*3/uL (ref 0.7–4.0)
MCH: 30.6 pg (ref 26.0–34.0)
MCHC: 33.1 g/dL (ref 30.0–36.0)
MCV: 92.5 fL (ref 78.0–100.0)
MONOS PCT: 8 % (ref 3–12)
Monocytes Absolute: 0.8 10*3/uL (ref 0.1–1.0)
NEUTROS ABS: 7.6 10*3/uL (ref 1.7–7.7)
NEUTROS PCT: 76 % (ref 43–77)
Platelets: 143 10*3/uL — ABNORMAL LOW (ref 150–400)
RBC: 4.83 MIL/uL (ref 4.22–5.81)
RDW: 14.4 % (ref 11.5–15.5)
WBC: 10.1 10*3/uL (ref 4.0–10.5)

## 2014-05-31 LAB — GLUCOSE, CAPILLARY
GLUCOSE-CAPILLARY: 156 mg/dL — AB (ref 70–99)
Glucose-Capillary: 147 mg/dL — ABNORMAL HIGH (ref 70–99)
Glucose-Capillary: 123 mg/dL — ABNORMAL HIGH (ref 70–99)
Glucose-Capillary: 136 mg/dL — ABNORMAL HIGH (ref 70–99)

## 2014-05-31 NOTE — Progress Notes (Signed)
  Echocardiogram 2D Echocardiogram has been performed.  Jeffrey [name] FRANCES 05/31/2014, 10:36 AM

## 2014-05-31 NOTE — Progress Notes (Signed)
PT Cancellation Note  Patient Details Name: LAWERNCE EARLL MRN: [id number] DOB: [birth year]   Cancelled Treatment:    Reason Eval/Treat Not Completed: Medical issues which prohibited therapy.  Patient remains on bedrest per orders.  MD:  Please write activity orders when appropriate for patient.  PT will initiate evaluation at that time.  Thank you.   Despina Pole 05/31/2014, 11:52 AM Carita Pian. Sanjuana Kava, [place] Pager [phone]

## 2014-05-31 NOTE — Progress Notes (Signed)
STROKE TEAM PROGRESS NOTE   HISTORY Jeffrey [name] is an 74 y.o. male with a history of hypertension, hyperlipidemia, coronary artery disease and diabetes mellitus, brought to the emergency room after being found lying in his 69 at home. He was noted to have right-sided weakness and speech. He also appeared to be somewhat confused. Code stroke was activated. It's not clear exactly when he was last known well, however. CT scan of his head showed an acute left thalamic and internal capsular ridge with intraventricular extension. Code stroke was subsequently canceled. NIH stroke score was 11.  LSN: Unclear. tPA Given: No: ICH, IVH mRankin:   SUBJECTIVE (INTERVAL HISTORY) Family at bedside. Reviewed imaging of brain with family. Discussed likely etiologies. He feels significantly improved, moving right extremities. He endorses compliance with medications, no issues with BP at home.    OBJECTIVE Temp:  [98.2 F (36.8 C)-98.4 F (36.9 C)] 98.2 F (36.8 C) (03/25 2000) Pulse Rate:  [89-107] 104 (03/26 1200) Cardiac Rhythm:  [-]  Resp:  [17-25] 17 (03/26 1200) BP: (123-145)/(62-82) 140/73 mmHg (03/26 1200) SpO2:  [91 %-99 %] 99 % (03/26 1200)   Recent Labs Lab 05/30/14 1046 05/30/14 1753 05/30/14 2206 05/31/14 0816 05/31/14 1210  GLUCAP 180* 140* 127* 147* 156*    Recent Labs Lab 05/30/14 0714 05/30/14 0731  NA 144 145  K 3.3* 3.4*  CL 110 110  CO2 20  --   GLUCOSE 140* 139*  BUN 20 22  CREATININE 1.09 0.80  CALCIUM 9.5  --     Recent Labs Lab 05/30/14 0714  AST 74*  ALT 35  ALKPHOS 95  BILITOT 1.0  PROT 6.8  ALBUMIN 3.8    Recent Labs Lab 05/30/14 0714 05/30/14 0731  WBC 11.8*  --   NEUTROABS 10.7*  --   HGB 15.0 15.0  HCT 42.8 44.0  MCV 89.0  --   PLT 142*  --    No results for input(s): CKTOTAL, CKMB, CKMBINDEX, TROPONINI in the last 168 hours.  Recent Labs  05/30/14 0714  LABPROT 14.9  INR 1.16    Recent Labs  05/30/14 1904   COLORURINE YELLOW  LABSPEC 1.020  PHURINE 5.5  GLUCOSEU >1000*  HGBUR LARGE*  BILIRUBINUR NEGATIVE  KETONESUR 40*  PROTEINUR NEGATIVE  UROBILINOGEN 0.2  NITRITE NEGATIVE  LEUKOCYTESUR NEGATIVE       Component Value Date/Time   CHOL 202* 09/30/2013   CHOL 202* 09/30/2013   CHOL 202* 09/30/2013   CHOL 211 02/01/2011   TRIG 342 09/30/2013   TRIG 342 09/30/2013   TRIG 342* 09/30/2013   TRIG 522 02/01/2011   TRIG 522 02/01/2011   HDL 35 09/30/2013   HDL 35 09/30/2013   HDL 35 09/30/2013   CHOLHDL 4 02/07/2012 1457   VLDL 55.4* 02/07/2012 1457   LDLCALC 98 09/30/2013   LDLCALC 98 09/30/2013   LDLCALC 98 09/30/2013   Lab Results  Component Value Date   HGBA1C 7.8* 04/29/2014      Component Value Date/Time   LABOPIA NONE DETECTED 05/30/2014 1904   COCAINSCRNUR NONE DETECTED 05/30/2014 1904   LABBENZ NONE DETECTED 05/30/2014 1904   AMPHETMU NONE DETECTED 05/30/2014 1904   THCU NONE DETECTED 05/30/2014 1904   LABBARB NONE DETECTED 05/30/2014 1904     Recent Labs Lab 05/30/14 0714  ETH <5    Ct Head (brain) Wo Contrast 05/30/2014    1. Acute LEFT thalamic and LEFT internal capsule parenchymal hemorrhage compatible with hypertensive hemorrhage. Intraventricular break through,  with blood in both lateral ventricles, third and fourth ventricles.  2. No hydrocephalus.    Ct Head (brain) Wo Contrast - Pending   Ct Cervical Spine Wo Contrast 05/30/2014    1. No acute fracture or traumatic subluxation.  2. Multilevel spondylosis of the cervical spine.  3. Anterior and posterior fusion at C2-3 and C3-4 is likely acquired.  4. Osseous foramina are scratch the osseous foraminal narrowing is most pronounced on the left at C5-6 and C6-7.  5. Bilateral sub cm thyroid nodules. Sub-centimeter thyroid nodule(s) noted, too small to characterize, but most likely benign in the absence of known clinical risk factors for thyroid carcinoma.      Chest Port 1 View 05/30/2014    No  active disease.    PHYSICAL EXAM Physical exam: Exam: Gen: NAD Eyes: anicteric sclerae, moist conjunctivae                    CV: no MRG, no carotid bruits, no peripheral edema Mental Status: Alert, Oriented to person, place, month, year. Follows commands. Intact remote and recent memory, intact fund of knowledge, concentration and attention. Lungs clear to auscultation. No wheezing, rales.  Neuro: Detailed Neurologic Exam  Speech:    No aphasia, +dysarthria  Cranial Nerves:    The pupils are equal, round, and reactive to light. Fundi flat.  EOMI. Conjugate gaze. Visual fields full. Mild right facial weakness. Tongue midline. Hearing intact. Shoulder shrug intact.  Motor Observation:    no involuntary movements noted. Tone appears normal.     Strength:    Right hemiparesis. Improved from admission. Can hold right leg antigravity. Right arm 2/5.     Sensation:  Intact to LT  Plantars upgoing right, equivocal left.    ASSESSMENT/PLAN [name] is a 74 y.o. male with history of hypertension, hyperlipidemia, coronary artery disease, and diabetes mellitus, presenting with right hemiparesis and speech difficulties.[name] He did not receive IV t-PA due to hemorrhage.  Stroke: Dominant - Acute LEFT thalamic and LEFT internal capsule parenchymal hemorrhage compatible likely ischemic with hemorrhagic transformation  Resultant  Right hemiparesis and dysarthria  MRI - pending  MRA -  pending  Repeat head CT - Pending  Carotid Doppler - Pending  2D Echo - Pending  LDL not performed  HgbA1c  7.8   (04/29/2014)  SCDs for VTE prophylaxis  Diet NPO time specified no liquids  no antithrombotic prior to admission, now on no antithrombotic  Patient counseled to be compliant with his antithrombotic medications  Ongoing aggressive stroke risk factor management  Therapy recommendations: Pending  Disposition: Pending  Hypertension  Home meds: no antihtn. meds  PTA.  Stable  Hyperlipidemia  Home meds: No lipid lowering meds PTA  LDL not performed, goal < 70  Add  Continue statin at discharge  Diabetes  HgbA1c 7.8 in February, goal < 7.0  Uncontrolled  Other Stroke Risk Factors  Advanced age  Cigarette smoker, quit smoking.  Obesity, Body mass index is 30.14 kg/(m^2).   Coronary artery disease   Other Active Problems  Hypokalemia  Leukocytosis  Other Pertinent History    Personally examined patient and images, and have documentes history, physical, neuro exam,assessment and plan as stated above.   Sarina Ill, MD Stroke Neurology [phone] [facility]   A total of 25 minutes was spent face-to-face with this patient. Over half this time was spent on counseling patient on the stroke diagnosis and different diagnostic and therapeutic options available.  To contact Stroke Continuity provider, please refer to [email or web address] After hours, contact General Neurology

## 2014-05-31 NOTE — Progress Notes (Signed)
Patient arrived to unit from [place] with family at bedside. Vitals taken and wnl. Patient resting comfortably at this time. Patient and family oriented to unit, call bell within reach, and bed alarm set. Tomma Rakers RN

## 2014-05-31 NOTE — Plan of Care (Signed)
Problem: Acute Treatment Outcomes Goal: Neuro exam at baseline or improved NIH now 11, left side stronger than right side MD made aware.

## 2014-05-31 NOTE — Evaluation (Signed)
Clinical/Bedside Swallow Evaluation Patient Details  Name: Jeffrey [name] MRN: [id number] Date of Birth: [birth year]  Today's Date: 05/31/2014 Time: SLP Start Time (ACUTE ONLY): 1033 SLP Stop Time (ACUTE ONLY): 1048 SLP Time Calculation (min) (ACUTE ONLY): 15 min  Past Medical History:  Past Medical History  Diagnosis Date  . CAD (coronary artery disease)     PCI distal RCA ...2004, residual 70% LAD   /   ...nuclear...03/2007...no ischemia.Marland KitchenMarland Kitchenpreserved LV /  nuclear...03/03/2009...inferior scar..no ischemia..EF 51%  . Dyslipidemia     takes Atorvastatin daily  . Internal hemorrhoids   . Gout     takes Allopurinol daily and Colchicine as needed  . Seasonal allergic rhinitis   . Right carotid bruit   . Cyst of nasopharynx     per ENT Wilburn Cornelia  . Myocardial infarction 31yrs ago  . Peripheral edema     takes Lasix daily  . HTN (hypertension)     takes Cardura,Metoprolol,[name],and Amlodipine daily  . Arthritis   . GERD (gastroesophageal reflux disease)     takes Omeprazole daily  . History of colon polyps   . Type 2 diabetes, uncontrolled, with neuropathy 1989    takes Invokana daily and has an insulin pump (Dr. [name])  . 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 Cataract extraction Bilateral 2010  . Elbow surgery Right 1997  . Balloon angioplasty, artery  1992, 2004    CAD, Dr. Ron Parker  . Colonoscopy  04/03/2002    adenomatous polyp, int hemorrhoids  . Colonoscopy  07/18/2007    normal (Dr. Fuller Plan)  . Rotator cuff repair  09/2011    left, with subacromial decompression  . Nasal septum surgery    . Colonoscopy  09/26/2012    tubular adenoma, sm int hem, rpt 5 yrs Fuller Plan)  . Tonsillectomy and adenoidectomy    . Eye lids raised    . Cardiac catheterization  2004  . Polypectomy N/A 05/27/2013    Procedure: ENDOSCOPIC NASOPHARYNGEAL MASS;  Surgeon: [name], MD   HPI:  Pt is 74 y.o. male with a history of hypertension, hyperlipidemia, coronary artery disease, GERD, nasopharyngeal cyst with chronic hoarseness,  and diabetes mellitus, brought to the emergency room after being found lying in his 7 at home. He was noted to have right-sided weakness and slurred speech along with AMS. Code stroke was activated. It's not clear exactly when he was last known well. CT scan of his head showed acute left thalamic and left internal capsule parenchymal hemorrhage . NIH stroke score was 11.   Assessment / Plan / Recommendation Clinical Impression  Pt presents with swallow capabilites within functional limits. No overt signs or symptoms of aspiration with any consistencies trialed including thin liquid, puree, and regular solids.     Aspiration Risk       Diet Recommendation Regular   Liquid Administration via: Straw;Cup Medication Administration: Whole meds with liquid Supervision: Intermittent supervision to cue for compensatory strategies;Patient able to self feed Compensations: Slow rate;Small sips/bites;Follow solids with liquid Postural Changes and/or Swallow Maneuvers: Seated upright 90 degrees;Upright 30-60 min after meal    Other  Recommendations Oral Care Recommendations: Oral care BID   Follow Up Recommendations       Frequency and Duration        Pertinent Vitals/Pain     SLP Swallow Goals     Swallow Study Prior Functional  Status       General Date of Onset: 05/30/14 HPI: Pt is 74 y.o. male with a history of hypertension, hyperlipidemia, coronary artery disease, GERD, nasopharyngeal cyst with chronic hoarseness,  and diabetes mellitus, brought to the emergency room after being found lying in his carport at home. He was noted to have right-sided weakness and slurred speech along with AMS. Code stroke was activated. It's not clear exactly when he was last known well. CT scan of his head showed acute left thalamic and  left internal capsule parenchymal hemorrhage . NIH stroke score was 11. Type of Study: Bedside swallow evaluation Diet Prior to this Study: NPO Temperature Spikes Noted: No Respiratory Status: Room air Behavior/Cognition: Alert;Pleasant mood;Confused Oral Cavity - Dentition: Adequate natural dentition Self-Feeding Abilities: Able to feed self Patient Positioning: Upright in bed Baseline Vocal Quality: Clear Volitional Cough: Weak Volitional Swallow: Able to elicit    Oral/Motor/Sensory Function Overall Oral Motor/Sensory Function: Appears within functional limits for tasks assessed Labial ROM: Within Functional Limits Labial Symmetry: Within Functional Limits Labial Strength: Within Functional Limits Labial Sensation: Within Functional Limits Lingual ROM: Within Functional Limits Lingual Symmetry: Within Functional Limits Lingual Strength: Within Functional Limits Lingual Sensation: Within Functional Limits Facial ROM: Within Functional Limits Facial Symmetry: Within Functional Limits Facial Strength: Within Functional Limits Facial Sensation: Within Functional Limits   Ice Chips Ice chips: Within functional limits Presentation: Spoon;Self Fed   Thin Liquid Thin Liquid: Within functional limits Presentation: Straw;Cup;Self Fed    Nectar Thick Nectar Thick Liquid: Not tested   Honey Thick Honey Thick Liquid: Not tested   Puree Puree: Within functional limits Presentation: Self Fed   Solid   GO    Solid: Within functional limits Presentation: Self Fed (with min A for self feed from SLP secondary to UE weakness)      Arvil Chaco MA, [place] Language Pathologist    Arvil Chaco E 05/31/2014,11:53 AM

## 2014-05-31 NOTE — Progress Notes (Signed)
Stroke education materials given to patient: Stroke Early Stages of Recovery.

## 2014-06-01 LAB — GLUCOSE, CAPILLARY
Glucose-Capillary: 121 mg/dL — ABNORMAL HIGH (ref 70–99)
Glucose-Capillary: 125 mg/dL — ABNORMAL HIGH (ref 70–99)
Glucose-Capillary: 155 mg/dL — ABNORMAL HIGH (ref 70–99)
Glucose-Capillary: 148 mg/dL — ABNORMAL HIGH (ref 70–99)

## 2014-06-01 MED ORDER — ATORVASTATIN CALCIUM 40 MG PO TABS
40.0000 mg | ORAL_TABLET | Freq: Every day | ORAL | Status: DC
  Administered 2014-06-01 – 2014-06-02 (×2): 40 mg via ORAL
  Filled 2014-06-01 (×2): qty 1

## 2014-06-01 MED ORDER — AMLODIPINE BESYLATE 10 MG PO TABS
10.0000 mg | ORAL_TABLET | Freq: Every day | ORAL | Status: DC
  Administered 2014-06-01 – 2014-06-03 (×3): 10 mg via ORAL
  Filled 2014-06-01 (×3): qty 1

## 2014-06-01 MED ORDER — PANTOPRAZOLE SODIUM 40 MG PO TBEC
40.0000 mg | DELAYED_RELEASE_TABLET | Freq: Every day | ORAL | Status: DC
  Administered 2014-06-01 – 2014-06-03 (×3): 40 mg via ORAL
  Filled 2014-06-01 (×3): qty 1

## 2014-06-01 NOTE — Progress Notes (Signed)
Physical Therapy Evaluation Patient Details Name: Jeffrey [name] MRN: [id number] DOB: 05-12-1940 Today's Date: 06/01/2014   History of Present Illness  74 yo male with hx of HTN, HLD, CAD, and DM, found collapsed in carport with R weakness.  Dx of L thalamic and internal capsular hemorrhage with intraventricular extension.  Clinical Impression  Patient seen with family present, states back pain when he moves, possibly from when he fell.  Also reports double vision currently.  Tolerated during treatment.  Patient presents with limited movement in R side vs L, and decreased on R UE vs L LE, both tending to have greater flexion tone overall.  Bed mobility and sit to stand transfer with Max Assist, with increased dizziness with position changes.  Legs tend to buckle with standing, so no transfer attempted today to bedside chair, recommend +2 assist at this time for transfer.  Deficits of strength, coordination, and balance due to R side weakness and vision.  Patient is appropriate for skilled PT services and will remain on caseload, is also good candidate for inpatient rehab if available.    Follow Up Recommendations CIR;Supervision/Assistance - 24 hour    Equipment Recommendations  None recommended by PT    Recommendations for Other Services Rehab consult     Precautions / Restrictions Precautions Precautions: Fall Restrictions Weight Bearing Restrictions: No      Mobility  Bed Mobility Overal bed mobility: Needs Assistance Bed Mobility: Rolling;Supine to Sit Rolling: Mod assist   Supine to sit: Max assist        Transfers Overall transfer level: Needs assistance Equipment used: 1 person hand held assist Transfers: Sit to/from [facility] Sit to Stand: Mod assist;Max assist Stand pivot transfers: +2 physical assistance (not tested on evaluation)       General transfer comment: Trial of RW, with R hand supported on walker, better with  HHA  Ambulation/Gait                Stairs            Wheelchair Mobility    Modified Rankin (Stroke Patients Only) Modified Rankin (Stroke Patients Only) Pre-Morbid Rankin Score: No symptoms Modified Rankin: Severe disability     Balance Overall balance assessment: Needs assistance Sitting-balance support: Bilateral upper extremity supported Sitting balance-Leahy Scale: Poor Sitting balance - Comments: Mild L lateral lean, difficulty using R UE for support Postural control: Left lateral lean   Standing balance-Leahy Scale: Zero                               Pertinent Vitals/Pain Pain Assessment: 0-10 Pain Score: 4  Pain Location: low back Pain Intervention(s): Limited activity within patient's tolerance;Monitored during session;Repositioned    Home Living Family/patient expects to be discharged to:: Inpatient rehab Living Arrangements: Alone;Spouse/significant other Available Help at Discharge: Family                  Prior Function Level of Independence: Independent         Comments: Retired Journalist, newspaper        Extremity/Trunk Assessment   Upper Extremity Assessment: RUE deficits/detail RUE Deficits / Details: Weak, flexion pattern tone, no restrictions for AAROM.         Lower Extremity Assessment: RLE deficits/detail RLE Deficits / Details: Weak, flexion/adduction stronger vs extension/abduction, R ankle PF and inversion dominant tone.       Communication  Communication: No difficulties  Cognition Arousal/Alertness: Awake/alert Behavior During Therapy: WFL for tasks assessed/performed Overall Cognitive Status: Within Functional Limits for tasks assessed                      General Comments General comments (skin integrity, edema, etc.): Good safety and insight to deficits, Weak, legs buckle in standing.    Exercises General Exercises - Lower Extremity Ankle Circles/Pumps:  AAROM;Right;5 reps;Seated Heel Slides: AAROM;Right;5 reps;Supine Hip ABduction/ADduction: AAROM;Right;5 reps;Supine Straight Leg Raises: AAROM;Right;5 reps;Supine      Assessment/Plan    PT Assessment Patient needs continued PT services  PT Diagnosis Abnormality of gait;Generalized weakness;Acute pain;Hemiplegia dominant side   PT Problem List Decreased strength;Decreased activity tolerance;Decreased mobility;Decreased balance;Decreased coordination;Pain  PT Treatment Interventions Gait training;Functional mobility training;Therapeutic activities;Therapeutic exercise;Balance training;Neuromuscular re-education;Patient/family education   PT Goals (Current goals can be found in the Care Plan section) Acute Rehab PT Goals Patient Stated Goal: To get better PT Goal Formulation: With patient Time For Goal Achievement: 06/15/14 Potential to Achieve Goals: Good    Frequency Min 4X/week   Barriers to discharge Decreased caregiver support Spouse is unable to provide full support    Co-evaluation               End of Session Equipment Utilized During Treatment: Gait belt Activity Tolerance: Patient tolerated treatment well;Patient limited by pain Patient left: in bed;with call bell/phone within reach;with bed alarm set;with SCD's reapplied;with family/visitor present Nurse Communication: Mobility status;Precautions         Time: 5825-1898 PT Time Calculation (min) (ACUTE ONLY): 35 min   Charges:   PT Evaluation $Initial PT Evaluation Tier I: 1 Procedure PT Treatments $Therapeutic Exercise: 8-22 mins   PT G CodesZenia Resides, Larance Ratledge L 2014-06-03, 1:27 PM

## 2014-06-01 NOTE — Progress Notes (Signed)
STROKE TEAM PROGRESS NOTE   HISTORY Jeffrey [name] is an 74 y.o. male with a history of hypertension, hyperlipidemia, coronary artery disease and diabetes mellitus, brought to the emergency room after being found lying in his 39 at home. He was noted to have right-sided weakness and speech. He also appeared to be somewhat confused. Code stroke was activated. It's not clear exactly when he was last known well, however. CT scan of his head showed an acute left thalamic and internal capsular ridge with intraventricular extension. Code stroke was subsequently canceled. NIH stroke score was 11.  LSN: Unclear. tPA Given: No: ICH, IVH mRankin:   SUBJECTIVE (INTERVAL HISTORY) Family at bedside. Reviewed imaging of brain with family. Discussed likely etiologies. He feels significantly improved, moving right extremities. He endorses compliance with medications, no issues with BP at home. He is moving even better today, feels speech is improved. Has blurry vision that continues.   OBJECTIVE Temp:  [97.3 F (36.3 C)-98.6 F (37 C)] 97.9 F (36.6 C) (03/27 0522) Pulse Rate:  [80-104] 82 (03/27 0522) Cardiac Rhythm:  [-]  Resp:  [16-22] 18 (03/27 0522) BP: (125-154)/(67-79) 126/79 mmHg (03/27 0522) SpO2:  [92 %-99 %] 96 % (03/27 0522)   Recent Labs Lab 05/31/14 0816 05/31/14 1210 05/31/14 1628 05/31/14 2205 06/01/14 0635  GLUCAP 147* 156* 123* 136* 121*    Recent Labs Lab 05/30/14 0714 05/30/14 0731 05/31/14 1515  NA 144 145 146*  K 3.3* 3.4* 3.5  CL 110 110 115*  CO2 20  --  21  GLUCOSE 140* 139* 129*  BUN 20 22 23   CREATININE 1.09 0.80 1.02  CALCIUM 9.5  --  9.2    Recent Labs Lab 05/30/14 0714  AST 74*  ALT 35  ALKPHOS 95  BILITOT 1.0  PROT 6.8  ALBUMIN 3.8    Recent Labs Lab 05/30/14 0714 05/30/14 0731 05/31/14 1515  WBC 11.8*  --  10.1  NEUTROABS 10.7*  --  7.6  HGB 15.0 15.0 14.8  HCT 42.8 44.0 44.7  MCV 89.0  --  92.5  PLT 142*  --  143*   No  results for input(s): CKTOTAL, CKMB, CKMBINDEX, TROPONINI in the last 168 hours.  Recent Labs  05/30/14 0714  LABPROT 14.9  INR 1.16    Recent Labs  05/30/14 1904  COLORURINE YELLOW  LABSPEC 1.020  PHURINE 5.5  GLUCOSEU >1000*  HGBUR LARGE*  BILIRUBINUR NEGATIVE  KETONESUR 40*  PROTEINUR NEGATIVE  UROBILINOGEN 0.2  NITRITE NEGATIVE  LEUKOCYTESUR NEGATIVE       Component Value Date/Time   CHOL 202* 09/30/2013   CHOL 202* 09/30/2013   CHOL 202* 09/30/2013   CHOL 211 02/01/2011   TRIG 342 09/30/2013   TRIG 342 09/30/2013   TRIG 342* 09/30/2013   TRIG 522 02/01/2011   TRIG 522 02/01/2011   HDL 35 09/30/2013   HDL 35 09/30/2013   HDL 35 09/30/2013   CHOLHDL 4 02/07/2012 1457   VLDL 55.4* 02/07/2012 1457   LDLCALC 98 09/30/2013   LDLCALC 98 09/30/2013   LDLCALC 98 09/30/2013   Lab Results  Component Value Date   HGBA1C 7.8* 04/29/2014      Component Value Date/Time   LABOPIA NONE DETECTED 05/30/2014 1904   COCAINSCRNUR NONE DETECTED 05/30/2014 1904   LABBENZ NONE DETECTED 05/30/2014 1904   AMPHETMU NONE DETECTED 05/30/2014 1904   THCU NONE DETECTED 05/30/2014 1904   LABBARB NONE DETECTED 05/30/2014 1904     Recent Labs Lab 05/30/14 [phone]  ETH <5    Ct Head (brain) Wo Contrast 05/30/2014    1. Acute LEFT thalamic and LEFT internal capsule parenchymal hemorrhage compatible with hypertensive hemorrhage. Intraventricular break through, with blood in both lateral ventricles, third and fourth ventricles.  2. No hydrocephalus.    MRI / MRA Head Wo Contrast 05/31/2014 1. Left thalamic hemorrhage probably represents hemorrhagic transformation of a left thalamic and posterior corona radiata infarct. Intra-axial hemorrhage, edema, and mild mass effect have not significantly changed. 2. Increased intraventricular and bilateral subarachnoid hemorrhage since yesterday. No ventriculomegaly. 3.Negative intracranial MRA. 4. Underlying mild for age nonspecific  signal changes compatible with chronic small vessel disease.   Ct Cervical Spine Wo Contrast 05/30/2014    1. No acute fracture or traumatic subluxation.  2. Multilevel spondylosis of the cervical spine.  3. Anterior and posterior fusion at C2-3 and C3-4 is likely acquired.  4. Osseous foramina are scratch the osseous foraminal narrowing is most pronounced on the left at C5-6 and C6-7.  5. Bilateral sub cm thyroid nodules. Sub-centimeter thyroid nodule(s) noted, too small to characterize, but most likely benign in the absence of known clinical risk factors for thyroid carcinoma.      Chest Port 1 View 05/30/2014    No active disease.    PHYSICAL EXAM Physical exam: Exam: Gen: NAD Eyes: anicteric sclerae, moist conjunctivae                    CV: no MRG, no carotid bruits, no peripheral edema Mental Status: Alert, Oriented to person, place, month, year. Follows commands. Intact remote and recent memory, intact fund of knowledge, concentration and attention. Lungs clear to auscultation. No wheezing, rales.  Neuro: Detailed Neurologic Exam  Speech:    No aphasia, +dysarthria  Cranial Nerves:    The pupils are equal, round, and reactive to light. Fundi flat.  EOMI. Conjugate gaze. Visual fields full. Mild right facial weakness. Tongue midline. Hearing intact. Shoulder shrug intact.  Motor Observation:    no involuntary movements noted. Tone appears normal.     Strength:    Right hemiparesis. Improved from admission. Can hold right leg antigravity. Right arm 2/5.     Sensation:  Intact to LT  Plantars upgoing right, equivocal left.    ASSESSMENT/PLAN [name] is a 74 y.o. male with history of hypertension, hyperlipidemia, coronary artery disease, and diabetes mellitus, presenting with right hemiparesis and speech difficulties.[name] He did not receive IV t-PA due to hemorrhage.  Stroke: Dominant - Acute LEFT thalamic and LEFT internal capsule parenchymal hemorrhage  compatible likely ischemic with hemorrhagic transformation  Resultant  Right hemiparesis and dysarthria  MRI - as above  MRA -  as above - Negative   Carotid Doppler - Bilateral: 1-39% ICA stenosis. Vertebral artery flow is antegrade.   2D Echo - EF 60-65%. No cardiac source of emboli identified.  LDL pending  HgbA1c  7.8   (04/29/2014)  SCDs for VTE prophylaxis  Diet Heart Room service appropriate?: Yes; Fluid consistency:: Thin no liquids  no antithrombotic prior to admission, now on no antithrombotic  Patient counseled to be compliant with his antithrombotic medications  Ongoing aggressive stroke risk factor management  Therapy recommendations: Pending. Discussed with PT, feel patient is an excellent candidate for inpatient acute rehabilitation. Will follow recommendations.   Disposition: Pending  Hypertension  Home meds: no antihtn. meds PTA.  Stable  Hyperlipidemia  Home meds: No lipid lowering meds PTA  LDL pending, goal < 70  Add  Continue statin at discharge  Diabetes  HgbA1c 7.8 in February, goal < 7.0  Uncontrolled  Other Stroke Risk Factors  Advanced age  Cigarette smoker, quit smoking.  Obesity, Body mass index is 30.14 kg/(m^2).   Coronary artery disease   Other Active Problems     Other Pertinent History    Personally examined patient and images, and have documentes history, physical, neuro exam,assessment and plan as stated above.   Sarina Ill, MD Stroke Neurology [phone] [facility]   A total of 25 minutes was spent face-to-face with this patient and family. Over half this time was spent on counseling patient on the stroke diagnosis and different diagnostic and therapeutic options available.          To contact Stroke Continuity provider, please refer to [email or web address] After hours, contact General Neurology

## 2014-06-02 ENCOUNTER — Other Ambulatory Visit: Payer: Self-pay | Admitting: Neurology

## 2014-06-02 DIAGNOSIS — R29818 Other symptoms and signs involving the nervous system: Secondary | ICD-10-CM

## 2014-06-02 DIAGNOSIS — I619 Nontraumatic intracerebral hemorrhage, unspecified: Secondary | ICD-10-CM

## 2014-06-02 DIAGNOSIS — I618 Other nontraumatic intracerebral hemorrhage: Secondary | ICD-10-CM

## 2014-06-02 LAB — LIPID PANEL
Cholesterol: 168 mg/dL (ref 0–200)
HDL: 29 mg/dL — AB (ref >39)
LDL Cholesterol: 76 mg/dL (ref 0–99)
TRIGLYCERIDES: 316 mg/dL — AB (ref <150)
Total CHOL/HDL Ratio: 5.8 RATIO
VLDL: 63 mg/dL — ABNORMAL HIGH (ref 0–40)

## 2014-06-02 LAB — GLUCOSE, CAPILLARY
GLUCOSE-CAPILLARY: 180 mg/dL — AB (ref 70–99)
GLUCOSE-CAPILLARY: 116 mg/dL — AB (ref 70–99)
Glucose-Capillary: 162 mg/dL — ABNORMAL HIGH (ref 70–99)
Glucose-Capillary: 138 mg/dL — ABNORMAL HIGH (ref 70–99)

## 2014-06-02 LAB — CLOSTRIDIUM DIFFICILE BY PCR: CDIFFPCR: NEGATIVE

## 2014-06-02 MED ORDER — LISINOPRIL 10 MG PO TABS
10.0000 mg | ORAL_TABLET | Freq: Every day | ORAL | Status: DC
  Administered 2014-06-02 – 2014-06-03 (×2): 10 mg via ORAL
  Filled 2014-06-02 (×2): qty 1

## 2014-06-02 NOTE — Progress Notes (Signed)
I met with pt, wife and children at bedside. We discussed an inpt rehab stay and they are in agreement. I will begin [facility] insurance approval. I am hopeful for approval and bed available tomorrow. [phone]

## 2014-06-02 NOTE — Evaluation (Signed)
Occupational Therapy Evaluation Patient Details Name: Jeffrey [name] MRN: [id number] DOB: 1941/02/13 Today's Date: 06/02/2014    History of Present Illness 74 yo male with hx of HTN, HLD, CAD, and DM, found collapsed in carport with R weakness.  Dx of L thalamic and internal capsular hemorrhage with intraventricular extension.   Clinical Impression   Pt admitted with above. He demonstrates the below listed deficits and will benefit from continued OT to maximize safety and independence with BADLs.  Pt presents to OT with dizziness, impaired balance, occulomotor deficits, Rt hemiplegia and apraxia.  His is highly motivated to improve, but is limited by significant dizziness when upright - he was able to tolerate 30 mins EOB with mild postural challenges and dizziness 5/10.  Did not attempt OOB due to dizziness and focus of session was to increase activity tolerance with evoking increased dizziness.   He will be an excellent candidate for CIR.  Family is very supportive.  Currently, he requires mod A with BADLs.       Follow Up Recommendations  CIR    Equipment Recommendations  None recommended by OT (defer to CIR )    Recommendations for Other Services Rehab consult     Precautions / Restrictions Precautions Precautions: Fall Restrictions Weight Bearing Restrictions: No      Mobility Bed Mobility Overal bed mobility: Needs Assistance Bed Mobility: Rolling;Sidelying to Sit;Sit to Sidelying Rolling: Mod assist Sidelying to sit: Mod assist     Sit to sidelying: Mod assist General bed mobility comments: Pt with complaint of back pain with bed mobility.  Instructed him in log rolling technique to reduce back pain.  He requires mod A  to incorporate Rt side as he has poor awareness of movement on Rt.   Transfers                 General transfer comment: Did not attempt due to dizziness     Balance Overall balance assessment: Needs assistance Sitting-balance support:  Feet supported Sitting balance-Leahy Scale: Fair Sitting balance - Comments: Pt able to sit witohut assist, but ataxia and sway noted. He did not lose balance with no UE support, but unable to accept challenges or movement off his BOS Postural control: Left lateral lean                                  ADL Overall ADL's : Needs assistance/impaired Eating/Feeding: Modified independent;Bed level   Grooming: Wash/dry hands;Wash/dry face;Oral care;Brushing hair;Supervision/safety;Bed level (min A seated )   Upper Body Bathing: Minimal assitance;Sitting   Lower Body Bathing: Moderate assistance;Bed level   Upper Body Dressing : Moderate assistance;Sitting   Lower Body Dressing: Bed level;Total assistance   Toilet Transfer: Total assistance Toilet Transfer Details (indicate cue type and reason): unable to attempt due to dizziness  Toileting- Clothing Manipulation and Hygiene: Maximal assistance;Bed level       Functional mobility during ADLs: Moderate assistance (to EOB ) General ADL Comments: Pt unable to tolerate sit to stand due to dizziness.  Focused on seated tasks in attempts to acclimated to upright postures.  Pt instructed in gaze stabilization, but reports the objects roll      Vision Vision Assessment?: Yes Eye Alignment: Impaired (comment) Tracking/Visual Pursuits: Other (comment) Visual Fields: No apparent deficits Diplopia Assessment: Disappears with one eye closed;Objects split on top of one another;Present all the time/all directions Additional Comments: Pt with horizontal beating nystagmus  with lt gaze.  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  He has poor proprioceptive awareness of Rt UE and reports it often moves around without his awareness.  He has to rely on visual imput to move or use Rt UE.  He is unable to grade force of movement.  He demonstrates gross grasp and release and AAROM WFL.  full hand to mouth actively  RUE Sensation: decreased light touch;decreased proprioception RUE Coordination: decreased fine motor;decreased gross motor   Lower Extremity Assessment Lower Extremity Assessment: Defer to PT evaluation   Cervical / Trunk Assessment Cervical / Trunk Assessment: Other exceptions Cervical / Trunk Exceptions: Pt appears to have some ataxic movements - difficult to determine due to level of dizziness pt experiences when upright in sitting.  He maintains thoracic and lumbar flexion with lean to the left    Communication Communication Communication: No difficulties   Cognition Arousal/Alertness: Awake/alert Behavior During Therapy: WFL for tasks assessed/performed Overall Cognitive Status: Within Functional Limits for tasks assessed                     General Comments       Exercises Exercises: Other exercises Other Exercises Other Exercises: AAROM Rt UE all movements    Shoulder Instructions      Home Living Family/patient expects to be discharged to:: Inpatient rehab Living Arrangements: Spouse/significant other Available Help at Discharge: Family;Available 24 hours/day Type of Home: House Home Access: Stairs to enter [employer] of Steps: 3 Entrance Stairs-Rails: Right;Left;Can reach both Home Layout: One level     Bathroom Shower/Tub: Tub/shower unit Shower/tub characteristics:  Architectural technologist: Standard                Prior Functioning/Environment Level of Independence: Independent        Comments: Retired Theme park manager    OT Diagnosis: Generalized weakness;Disturbance of vision;Apraxia;Hemiplegia dominant side   OT Problem List: Decreased strength;Decreased range of motion;Decreased activity tolerance;Impaired balance (sitting and/or standing);Decreased coordination;Impaired vision/perception;Decreased safety awareness;Decreased knowledge of use of DME or AE;Impaired sensation;Impaired tone;Impaired UE functional use;Pain   OT Treatment/Interventions: Self-care/ADL training;Neuromuscular education;DME and/or AE instruction;Therapeutic activities;Visual/perceptual remediation/compensation;Patient/family education;Balance training    OT Goals(Current goals can be found in the care plan section) Acute Rehab OT Goals Patient Stated Goal: to get better  OT Goal Formulation: With patient Time For Goal Achievement: 06/16/14 Potential to Achieve Goals: Good ADL Goals Pt Will Perform Grooming: with min guard assist;sitting Pt Will Perform Upper Body Bathing: with set-up;with supervision;sitting Pt Will Perform Lower Body Bathing: with mod assist;sit to/from stand Pt Will Transfer to Toilet: with mod assist;stand pivot transfer;bedside commode Pt Will Perform Toileting - Clothing Manipulation and hygiene: with mod assist;sit to/from stand Additional ADL Goal #1: Pt and family will be independent with vision exercises  Additional ADL Goal #2: Pt wil tolerate EOB or OOB activities with dizziness no greater than 3/10  OT Frequency: Min 3X/week   Barriers to D/C:  Co-evaluation              End of Session Nurse Communication: Mobility status  Activity Tolerance: Other (comment) (limited by nausea) Patient left: in bed;with call bell/phone within reach;with family/visitor present   Time: 6301-6010 OT Time Calculation (min): 48  min Charges:  OT General Charges $OT Visit: 1 Procedure OT Evaluation $Initial OT Evaluation Tier I: 1 Procedure OT Treatments $Therapeutic Activity: 23-37 mins G-Codes:    Red Mandt M 06-15-14, 2:18 PM

## 2014-06-02 NOTE — Progress Notes (Signed)
PT Cancellation Note  Patient Details Name: Jeffrey [name] MRN: [id number] DOB: 19-Aug-1940   Cancelled Treatment:    Reason Eval/Treat Not Completed: Other (comment) Holding PT tx this PM as pt just finished working with OT and is reporting increased dizziness. Pt has not slept much so resting after OT tx. Will follow up next available time.   Candy Sledge A 06/02/2014, 1:51 PM  Candy Sledge, [place], DPT [phone]

## 2014-06-02 NOTE — Consult Note (Signed)
Physical Medicine and Rehabilitation Consult Reason for Consult: Acute left thalamic and left internal capsule parenchymal hemorrhage Referring Physician: Dr. Leonie Man   HPI: Jeffrey [name] is a 74 y.o. right handed male with history of hypertension, diabetes mellitus and peripheral neuropathy, coronary artery disease with balloon angioplasty. Independent prior to admission living with his wife. Presented 05/30/2014 with right sided weakness and slurred speech. Cranial CT scan showed acute left thalamic and left internal capsule parenchymal hemorrhage compatible with hypertensive hemorrhage. MRI again notes left thalamic hemorrhage showing some increased intraventricular and bilateral subarachnoid hemorrhage since recent cranial CT. MRA of the head was negative. Carotid Dopplers with no ICA stenosis. Echocardiogram with ejection fraction of 02% grade 1 diastolic dysfunction. Neurology follow-up with conservative care. Tolerating a regular consistency diet. Bouts of loose stool with C. difficile specimen pending. Physical therapy evaluation completed and ongoing with recommendations of physical medicine rehabilitation consult.   Review of Systems  Gastrointestinal:       GERD  Genitourinary: Positive for frequency.  Musculoskeletal: Positive for myalgias.  Neurological: Positive for dizziness.   Past Medical History  Diagnosis Date  . CAD (coronary artery disease)     PCI distal RCA ...2004, residual 70% LAD   /   ...nuclear...03/2007...no ischemia.Marland KitchenMarland Kitchenpreserved LV /  nuclear...03/03/2009...inferior scar..no ischemia..EF 51%  . Dyslipidemia     takes Atorvastatin daily  . Internal hemorrhoids   . Gout     takes Allopurinol daily and Colchicine as needed  . Seasonal allergic rhinitis   . Right carotid bruit   . Cyst of nasopharynx     per ENT Wilburn Cornelia  . Myocardial infarction 54yrs ago  . Peripheral edema     takes Lasix daily  . HTN (hypertension)     takes  Cardura,Metoprolol,[name],and Amlodipine daily  . Arthritis   . GERD (gastroesophageal reflux disease)     takes Omeprazole daily  . History of colon polyps   . Type 2 diabetes, uncontrolled, with neuropathy 1989    takes Invokana daily and has an insulin pump (Dr. [name])  . 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 Cataract extraction Bilateral 2010  . Elbow surgery Right 1997  . Balloon angioplasty, artery  1992, 2004    CAD, Dr. Ron Parker  . Colonoscopy  04/03/2002    adenomatous polyp, int hemorrhoids  . Colonoscopy  07/18/2007    normal (Dr. Fuller Plan)  . Rotator cuff repair  09/2011    left, with subacromial decompression  . Nasal septum surgery    . Colonoscopy  09/26/2012    tubular adenoma, sm int hem, rpt 5 yrs Fuller Plan)  . Tonsillectomy and adenoidectomy    . Eye lids raised    . Cardiac catheterization  2004  . Polypectomy N/A 05/27/2013    Procedure: ENDOSCOPIC NASOPHARYNGEAL MASS;  Surgeon: [name], MD   Family History  Problem Relation Age of Onset  . Alcohol abuse Father   . Coronary artery disease Neg Hx   . Stroke Neg Hx   . Cancer Neg Hx   . Diabetes Neg Hx   . Colon cancer Neg Hx    Social History:  reports that he has quit smoking. He has never used smokeless tobacco. He reports that he does not drink alcohol or use illicit drugs. Allergies:  Allergies  Allergen Reactions  . Niacin Other (See Comments)    REACTION: intolerant,not  allergic="flushing,hot flashes,turning red"   Medications Prior to Admission  Medication Sig Dispense Refill  . Canagliflozin (INVOKANA) 300 MG TABS Take 1 tablet (300 mg total) by mouth daily. 30 tablet   . fluticasone (FLONASE) 50 MCG/ACT nasal spray Place 2 sprays into both nostrils daily. 16 g 6  . Insulin Infusion Pump (H-TRON V100 INSULIN PUMP) MISC Patient uses Novolog in pump.    . meclizine (ANTIVERT) 25 MG tablet Take  1 tablet (25 mg total) by mouth 3 (three) times daily as needed for dizziness (watch for sedation). 20 tablet 0    Home: Home Living Family/patient expects to be discharged to:: Inpatient rehab Living Arrangements: Alone, Spouse/significant other Available Help at Discharge: Family  Functional History: Prior Function Level of Independence: Independent Comments: Retired Tax adviser Status:  Mobility: Bed Mobility Overal bed mobility: Needs Assistance Bed Mobility: Rolling, Supine to Sit Rolling: Mod assist Supine to sit: Max assist Transfers Overall transfer level: Needs assistance Equipment used: 1 person hand held assist Transfers: Sit to/from Stand, Stand Pivot Transfers Sit to Stand: Mod assist, Max assist Stand pivot transfers: +2 physical assistance (not tested on evaluation) General transfer comment: Trial of RW, with R hand supported on walker, better with HHA      ADL:    Cognition: Cognition Overall Cognitive Status: Within Functional Limits for tasks assessed Orientation Level: Oriented X4 Cognition Arousal/Alertness: Awake/alert Behavior During Therapy: WFL for tasks assessed/performed Overall Cognitive Status: Within Functional Limits for tasks assessed  Blood pressure 149/76, pulse 76, temperature 98.4 F (36.9 C), temperature source Oral, resp. rate 18, weight 89.9 kg (198 lb 3.1 oz), SpO2 97 %. Physical Exam  Vitals reviewed. Constitutional: He is oriented to person, place, and time. He appears well-developed.  HENT:  Head: Normocephalic.  Eyes: EOM are normal.  Neck: Normal range of motion. Neck supple. No thyromegaly present.  Cardiovascular: Normal rate and regular rhythm.   Respiratory: Effort normal and breath sounds normal. No respiratory distress.  GI: Soft. Bowel sounds are normal. He exhibits no distension.  Neurological: He is alert and oriented to person, place, and time.  Patient makes good eye contact with examiner. He is  oriented to person, place and date of birth. Follow simple commands. Speech dysarthric. Has difficulties consistently initiating movement of the right arm more than the right leg. RUE: 3 to 3+/5. RLE 3+ to 4/5 but inconsistent. Sensation tr/2 RUE and 1+/2 RLE.   Skin: Skin is warm and dry.  Psychiatric: He has a normal mood and affect. His behavior is normal.    Results for orders placed or performed during the hospital encounter of 05/30/14 (from the past 24 hour(s))  Glucose, capillary     Status: Abnormal   Collection Time: 06/01/14  6:35 AM  Result Value Ref Range   Glucose-Capillary 121 (H) 70 - 99 mg/dL   Comment 1 Notify RN    Comment 2 Document in Chart   Glucose, capillary     Status: Abnormal   Collection Time: 06/01/14 11:40 AM  Result Value Ref Range   Glucose-Capillary 125 (H) 70 - 99 mg/dL  Glucose, capillary     Status: Abnormal   Collection Time: 06/01/14  4:38 PM  Result Value Ref Range   Glucose-Capillary 155 (H) 70 - 99 mg/dL  Glucose, capillary     Status: Abnormal   Collection Time: 06/01/14 10:32 PM  Result Value Ref Range   Glucose-Capillary 148 (H) 70 - 99 mg/dL   Jeffrey [name] [facility] Contrast  05/31/2014   CLINICAL DATA:  74 year old male with found down, left thalamic hemorrhage diagnosed on CT. Initial encounter.  EXAM: MRI HEAD WITHOUT CONTRAST  MRA HEAD WITHOUT CONTRAST  TECHNIQUE: Multiplanar, multiecho pulse sequences of the brain and surrounding structures were obtained without intravenous contrast. Angiographic images of the head were obtained using MRA technique without contrast.  COMPARISON:  Head CT without contrast 05/30/2014.  FINDINGS: MRI HEAD FINDINGS  T2 weighted heterogeneous and T1 weighted mostly isointense blood products re - identified in the dorsal left thalamus, encompassing 19 x 30 x 21 mm (AP by transverse by CC), for an estimated blood volume of 6 mL. Edema and secondary mass effect around the thalamic hemorrhage have not significantly changed.  Edema does track into the left midbrain. The diffusion signal abnormality present does primarily correspond to the hemorrhage, but there is restricted diffusion in the posterior left corona radiata which does not definitely correspond to blood products (series 4, image 30).  No other restricted diffusion identified.  Intraventricular extension re- identified and has increased. No ventriculomegaly. There is bilateral subarachnoid hemorrhage which is new or increased (bilateral sylvian fissures series 7, image 15, bilateral occipital sulci image 10, and superior cerebellar folia image 10). No subdural or extradural blood identified. Basilar cisterns remain patent.  Major intracranial vascular flow voids are preserved.  Elsewhere there is scattered mild to moderate for age nonspecific cerebral white matter T2 and FLAIR hyperintensity. No cortical encephalomalacia identified. Right deep gray matter nuclei are within normal limits. Visible internal auditory structures appear normal. Mastoids are clear. Trace paranasal sinus mucosal thickening. Postoperative changes to the globes. Negative pituitary, cervicomedullary junction and visualized cervical spine. Normal bone marrow signal. Visualized scalp soft tissues are within normal limits.  MRA HEAD FINDINGS  Antegrade flow in the posterior circulation with dominant distal left vertebral artery. Patent PICA origins. Patent vertebrobasilar junction. No basilar stenosis. SCA and PCA origins are normal. Posterior communicating arteries are diminutive or absent. Bilateral PCA branches including the left P1 segment are within normal limits.  Antegrade flow in both ICA siphons no siphon stenosis. Normal ophthalmic artery origins. Normal carotid termini. Normal MCA and ACA origins.  Mildly dominant left A1 segment. Anterior communicating artery and visualized bilateral ACA branches are within normal limits. Visualized bilateral MCA branches are within normal limits.  IMPRESSION:  1. Left thalamic hemorrhage probably represents hemorrhagic transformation of a left thalamic and posterior corona radiata infarct. Intra-axial hemorrhage, edema, and mild mass effect have not significantly changed. 2. Increased intraventricular and bilateral subarachnoid hemorrhage since yesterday. No ventriculomegaly. 3.  Negative intracranial MRA. 4. Underlying mild for age nonspecific signal changes compatible with chronic small vessel disease. Study discussed by telephone with Neurology Dr. Wallie Char on 05/31/2014 at 1626 hours.   Electronically Signed   By: Genevie Ann M.D.   On: 05/31/2014 16:29   Jeffrey [name] Contrast  05/31/2014   CLINICAL DATA:  74 year old male with found down, left thalamic hemorrhage diagnosed on CT. Initial encounter.  EXAM: MRI HEAD WITHOUT CONTRAST  MRA HEAD WITHOUT CONTRAST  TECHNIQUE: Multiplanar, multiecho pulse sequences of the brain and surrounding structures were obtained without intravenous contrast. Angiographic images of the head were obtained using MRA technique without contrast.  COMPARISON:  Head CT without contrast 05/30/2014.  FINDINGS: MRI HEAD FINDINGS  T2 weighted heterogeneous and T1 weighted mostly isointense blood products re - identified in the dorsal left thalamus, encompassing 19 x 30 x 21 mm (AP by transverse by CC), for an  estimated blood volume of 6 mL. Edema and secondary mass effect around the thalamic hemorrhage have not significantly changed. Edema does track into the left midbrain. The diffusion signal abnormality present does primarily correspond to the hemorrhage, but there is restricted diffusion in the posterior left corona radiata which does not definitely correspond to blood products (series 4, image 30).  No other restricted diffusion identified.  Intraventricular extension re- identified and has increased. No ventriculomegaly. There is bilateral subarachnoid hemorrhage which is new or increased (bilateral sylvian fissures series 7, image 15,  bilateral occipital sulci image 10, and superior cerebellar folia image 10). No subdural or extradural blood identified. Basilar cisterns remain patent.  Major intracranial vascular flow voids are preserved.  Elsewhere there is scattered mild to moderate for age nonspecific cerebral white matter T2 and FLAIR hyperintensity. No cortical encephalomalacia identified. Right deep gray matter nuclei are within normal limits. Visible internal auditory structures appear normal. Mastoids are clear. Trace paranasal sinus mucosal thickening. Postoperative changes to the globes. Negative pituitary, cervicomedullary junction and visualized cervical spine. Normal bone marrow signal. Visualized scalp soft tissues are within normal limits.  MRA HEAD FINDINGS  Antegrade flow in the posterior circulation with dominant distal left vertebral artery. Patent PICA origins. Patent vertebrobasilar junction. No basilar stenosis. SCA and PCA origins are normal. Posterior communicating arteries are diminutive or absent. Bilateral PCA branches including the left P1 segment are within normal limits.  Antegrade flow in both ICA siphons no siphon stenosis. Normal ophthalmic artery origins. Normal carotid termini. Normal MCA and ACA origins.  Mildly dominant left A1 segment. Anterior communicating artery and visualized bilateral ACA branches are within normal limits. Visualized bilateral MCA branches are within normal limits.  IMPRESSION: 1. Left thalamic hemorrhage probably represents hemorrhagic transformation of a left thalamic and posterior corona radiata infarct. Intra-axial hemorrhage, edema, and mild mass effect have not significantly changed. 2. Increased intraventricular and bilateral subarachnoid hemorrhage since yesterday. No ventriculomegaly. 3.  Negative intracranial MRA. 4. Underlying mild for age nonspecific signal changes compatible with chronic small vessel disease. Study discussed by telephone with Neurology Dr. Wallie Char on  05/31/2014 at 1626 hours.   Electronically Signed   By: Genevie Ann M.D.   On: 05/31/2014 16:29    Assessment/Plan: Diagnosis: left thalamic ICH 1. Does the need for close, 24 hr/day medical supervision in concert with the patient's rehab needs make it unreasonable for this patient to be served in a less intensive setting? Yes 2. Co-Morbidities requiring supervision/potential complications: htn, DM2, gout  3. Due to bladder management, bowel management, safety, skin/wound care, disease management, medication administration, pain management and patient education, does the patient require 24 hr/day rehab nursing? Yes 4. Does the patient require coordinated care of a physician, rehab nurse, PT (1-2 hrs/day, 5 days/week), OT (1-2 hrs/day, 5 days/week) and SLP (1-2 hrs/day, 5 days/week) to address physical and functional deficits in the context of the above medical diagnosis(es)? Yes Addressing deficits in the following areas: balance, endurance, locomotion, strength, transferring, bowel/bladder control, bathing, dressing, feeding, grooming, toileting, speech and psychosocial support 5. Can the patient actively participate in an intensive therapy program of at least 3 hrs of therapy per day at least 5 days per week? Yes 6. The potential for patient to make measurable gains while on inpatient rehab is excellent 7. Anticipated functional outcomes upon discharge from inpatient rehab are supervision and min assist  with PT, supervision and min assist with OT, modified independent and supervision with SLP. 8. Estimated rehab length  of stay to reach the above functional goals is: 15-18 days 9. Does the patient have adequate social supports and living environment to accommodate these discharge functional goals? Yes 10. Anticipated D/C setting: Home 11. Anticipated post D/C treatments: HH therapy and Outpatient therapy 12. Overall Rehab/Functional Prognosis: excellent  RECOMMENDATIONS: This patient's condition is  appropriate for continued rehabilitative care in the following setting: CIR Patient has agreed to participate in recommended program. Yes Note that insurance prior authorization may be required for reimbursement for recommended care.  Comment: Rehab Admissions Coordinator to follow up.  Thanks,  Meredith Staggers, MD, Mellody Drown     06/02/2014

## 2014-06-02 NOTE — Progress Notes (Signed)
CARE MANAGEMENT NOTE 06/02/2014  Patient:  Jeffrey [name], Jeffrey [name]   Account Number:  [id number]  Date Initiated:  06/02/2014  Documentation initiated by:  Carles Collet  Subjective/Objective Assessment:   lives at home alone, admitted with a hemorrhagic stroke     Action/Plan:   will follow for any dc needs   Anticipated DC Date:  06/02/2014   Anticipated DC Plan:  IP REHAB FACILITY  In-house referral  Clinical Social Worker         Choice offered to / List presented to:             Status of service:   Medicare Important Message given?  YES (If response is "NO", the following Medicare IM given date fields will be blank) Date Medicare IM given:  06/02/2014 Medicare IM given by:  Carles Collet Date Additional Medicare IM given:   Additional Medicare IM given by:    Discharge Disposition:    Per UR Regulation:    If discussed at Long Length of Stay Meetings, dates discussed:    Comments:  06/02/14 11:30 Pt given IM letter. Carles Collet RN BSN CM

## 2014-06-02 NOTE — Progress Notes (Signed)
Occupational therapy progress note:  Attempted spot occlusion to reduce diplopia.  Pt with poor tolerance of this, therefore, fully occluded Rt lens of glasses.  This allows pt to move eyes binocularly and allows for peripheral input to both eyes which will improve his ability to establish verticality which will help with balance.    06/02/14 1639  OT Visit Information  Assistance Needed +2 (for OOB )  History of Present Illness 74 yo male with hx of HTN, HLD, CAD, and DM, found collapsed in carport with R weakness.  Dx of L thalamic and internal capsular hemorrhage with intraventricular extension.  OT Time Calculation  OT Start Time (ACUTE ONLY) 1613  OT Stop Time (ACUTE ONLY) 1634  OT Time Calculation (min) 21 min  Precautions  Precautions Fall  Pain Assessment  Pain Assessment No/denies pain  Vision- Assessment  Additional Comments Attempted spot occlusion to glasses (pt with bifocals).  Able to reduce the diplopia, but pt dislikes the spot taping and requesting to occlude the whole lens.  Pt is right eye dominant. Initially occluded Lt lends, but pt with poor tolerance.  Rt lens fully occluded with improved comfort from pt.  Discussed the rationale for occlusion and will continue to assess   OT - End of Session  Activity Tolerance Patient limited by fatigue  Patient left in bed;with call bell/phone within reach;with family/visitor present  OT Assessment/Plan  OT Plan Discharge plan remains appropriate  OT Frequency (ACUTE ONLY) Min 3X/week  Follow Up Recommendations CIR  OT Equipment None recommended by OT (defer to CIR )  Acute Rehab OT Goals  Patient Stated Goal to get better   OT Goal Formulation With patient  Time For Goal Achievement 06/16/14  OT General Charges  $OT Visit 1 Procedure  OT Treatments  $Therapeutic Activity 8-22 mins  [facility], OTR/L [phone]

## 2014-06-02 NOTE — Evaluation (Signed)
Speech Language Pathology Evaluation Patient Details Name: Jeffrey [name] MRN: [id number] DOB: [birth year] Today's Date: 06/02/2014 Time: 1829-9371 SLP Time Calculation (min) (ACUTE ONLY): 28 min  Problem List:  Patient Active Problem List   Diagnosis Date Noted  . Hemorrhagic stroke 05/30/2014  . ICH (intracerebral hemorrhage) 05/30/2014  . Benign paroxysmal positional vertigo 04/28/2014  . 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 Right carotid bruit   . Polyneuropathy, diabetic 08/05/2012  . Seasonal allergic rhinitis   . Trigger thumb of left hand 10/12/2011  . Acute chest wall pain 06/01/2011  . Epigastric abdominal pain 11/11/2010  . Gout   . Adenomatous polyps   . CAD (coronary artery disease)   . HTN (hypertension)   . Hyperlipidemia   . Type 2 diabetes, uncontrolled, with neuropathy   . Ejection fraction    Past Medical History:  Past Medical History  Diagnosis Date  . CAD (coronary artery disease)     PCI distal RCA ...2004, residual 70% LAD   /   ...nuclear...03/2007...no ischemia.Marland KitchenMarland Kitchenpreserved LV /  nuclear...03/03/2009...inferior scar..no ischemia..EF 51%  . Dyslipidemia     takes Atorvastatin daily  . Internal hemorrhoids   . Gout     takes Allopurinol daily and Colchicine as needed  . Seasonal allergic rhinitis   . Right carotid bruit   . Cyst of nasopharynx     per ENT Wilburn Cornelia  . Myocardial infarction 75yrs ago  . Peripheral edema     takes Lasix daily  . HTN (hypertension)     takes Cardura,Metoprolol,[name],and Amlodipine daily  . Arthritis   . GERD (gastroesophageal reflux disease)     takes Omeprazole daily  . History of colon polyps   . Type 2 diabetes, uncontrolled, with neuropathy 1989    takes Invokana daily and has an insulin pump (Dr. [name])  . 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 Cataract extraction Bilateral 2010   . Elbow surgery Right 1997  . Balloon angioplasty, artery  1992, 2004    CAD, Dr. Ron Parker  . Colonoscopy  04/03/2002    adenomatous polyp, int hemorrhoids  . Colonoscopy  07/18/2007    normal (Dr. Fuller Plan)  . Rotator cuff repair  09/2011    left, with subacromial decompression  . Nasal septum surgery    . Colonoscopy  09/26/2012    tubular adenoma, sm int hem, rpt 5 yrs Fuller Plan)  . Tonsillectomy and adenoidectomy    . Eye lids raised    . Cardiac catheterization  2004  . Polypectomy N/A 05/27/2013    Procedure: ENDOSCOPIC NASOPHARYNGEAL MASS;  Surgeon: [name], MD   HPI:  Pt is 74 y.o. male with a history of hypertension, hyperlipidemia, coronary artery disease, GERD, nasopharyngeal cyst with chronic hoarseness,  and diabetes mellitus, brought to the emergency room after being found lying in his 6 at home. He was noted to have right-sided weakness and slurred speech along with AMS. Code stroke was activated. It's not clear exactly when he was last known well. CT scan of his head showed acute left thalamic and left internal capsule parenchymal hemorrhage . NIH stroke score was 11.   Assessment / Plan / Recommendation Clinical Impression  Orders received; cognitive-linguistic evaluation completed.  Patient demonstrates mild high level deficits characterized by slowed processing, decreased word finding and thought organization, as well as impaired recall of new information which impacts his ability to  compensate for new deficits.  Given that patient was independently managing medications, household finances and other tasks prior to admission skilled SLP services are recommended to maximize functional independence and reduce overall burden of care prior to discharge home.  Additionally, [facility] is recommended at this time prior to discharge home.        SLP Assessment  Patient needs continued Speech Lanaguage Pathology Services    Follow Up Recommendations   Inpatient Rehab;24 hour supervision/assistance    Frequency and Duration min 2x/week  1 week   Pertinent Vitals/Pain Pain Assessment: No/denies pain Faces Pain Scale: Hurts little more Pain Location: low back with mobility  Pain Descriptors / Indicators: Grimacing;Guarding Pain Intervention(s): Monitored during session;Repositioned   SLP Goals  Patient/Family Stated Goal: to get better Potential to Achieve Goals (ACUTE ONLY): Good Potential Considerations (ACUTE ONLY): Ability to learn/carryover information  SLP Evaluation Prior Functioning  Cognitive/Linguistic Baseline: Within functional limits Type of Home: House  Lives With: Spouse Available Help at Discharge: Family;Available 24 hours/day Vocation: Retired Sports coach)   Cognition  Overall Cognitive Status: Impaired/Different from baseline Arousal/Alertness: Awake/alert Orientation Level: Oriented to person;Oriented to place;Disoriented to time;Oriented to situation Attention: Sustained Sustained Attention: Appears intact Memory: Impaired Memory Impairment: Storage deficit;Retrieval deficit;Decreased recall of new information Awareness: Impaired Awareness Impairment: Emergent impairment Problem Solving: Impaired Problem Solving Impairment: Verbal complex;Functional basic;Functional complex Executive Function: Self Monitoring;Self Correcting Self Monitoring: Impaired Self Monitoring Impairment: Verbal complex;Functional complex Self Correcting: Impaired Self Correcting Impairment: Verbal complex;Functional complex Safety/Judgment: Impaired Comments: required cues to recall and return demonstration of call bell for assist    Comprehension  Auditory Comprehension Overall Auditory Comprehension: Impaired Commands: Impaired Complex Commands: 75-100% accurate (80%) Interfering Components: Hearing;Processing speed;Working memory;Visual impairments EffectiveTechniques: Tax adviser Discrimination: Exceptions to [facility] Reading Comprehension Reading Status: Impaired Paragraph Level: Impaired Functional Environmental (signs, name badge): Within functional limits Interfering Components: Attention;Visual scanning;Processing time;Visual perceptual Effective Techniques: Large print;Verbal cueing;Tactile cueing;Visual cueing    Expression Expression Primary Mode of Expression: Verbal Verbal Expression Overall Verbal Expression: Impaired Level of Generative/Spontaneous Verbalization: Conversation Naming: Impairment Responsive: 76-100% accurate Confrontation: Within functional limits Convergent: 75-100% accurate Divergent: 75-100% accurate Pragmatics: No impairment Non-Verbal Means of Communication: Not applicable Other Verbal Expression Comments: occassional word finding difficulty/paraphasic errors in conversation  Written Expression Dominant Hand: Right Written Expression: Unable to assess (comment) (right upper extremity weakness)   Oral / Motor Oral Motor/Sensory Function Overall Oral Motor/Sensory Function: Impaired (mild right sided weakness) Labial ROM: Reduced right Labial Symmetry: Abnormal symmetry right Motor Speech Overall Motor Speech: Impaired Respiration: Within functional limits Phonation: Normal Resonance: Within functional limits Articulation: Impaired Level of Impairment: Conversation Intelligibility: Intelligibility reduced Conversation: 75-100% accurate Motor Planning: Witnin functional limits Motor Speech Errors: Not applicable Interfering Components: Hearing loss Effective Techniques: Over-articulate;Increased vocal intensity   GO    Carmelia Roller., CCC-SLP [phone]  Jeffrey [name] 06/02/2014, 4:28 PM

## 2014-06-02 NOTE — Progress Notes (Addendum)
STROKE TEAM PROGRESS NOTE   HISTORY Jeffrey [name] is an 74 y.o. male with a history of hypertension, hyperlipidemia, coronary artery disease and diabetes mellitus, brought to the emergency room after being found lying in his 26 at home. He was noted to have right-sided weakness and speech. He also appeared to be somewhat confused. Code stroke was activated. It's not clear exactly when he was last known well, however. CT scan of his head showed an acute left thalamic and internal capsular ridge with intraventricular extension. Code stroke was subsequently canceled. NIH stroke score was 11. His last seen normal was unknown. He was not a PTA candidate given  ICH, IVH.   SUBJECTIVE (INTERVAL HISTORY) Family at bedside. Pt now on enteric precautions d/t loose stools yesterday. Pt no acute event overnight.    OBJECTIVE Temp:  [97.9 F (36.6 C)-98.9 F (37.2 C)] 98.5 F (36.9 C) (03/28 1013) Pulse Rate:  [62-86] 86 (03/28 1013) Cardiac Rhythm:  [-]  Resp:  [18-20] 18 (03/28 1013) BP: (135-175)/(69-93) 175/93 mmHg (03/28 1013) SpO2:  [96 %-97 %] 97 % (03/28 1013)   Recent Labs Lab 06/01/14 0635 06/01/14 1140 06/01/14 1638 06/01/14 2232 06/02/14 0636  GLUCAP 121* 125* 155* 148* 162*    Recent Labs Lab 05/30/14 0714 05/30/14 0731 05/31/14 1515  NA 144 145 146*  K 3.3* 3.4* 3.5  CL 110 110 115*  CO2 20  --  21  GLUCOSE 140* 139* 129*  BUN 20 22 23   CREATININE 1.09 0.80 1.02  CALCIUM 9.5  --  9.2    Recent Labs Lab 05/30/14 0714  AST 74*  ALT 35  ALKPHOS 95  BILITOT 1.0  PROT 6.8  ALBUMIN 3.8    Recent Labs Lab 05/30/14 0714 05/30/14 0731 05/31/14 1515  WBC 11.8*  --  10.1  NEUTROABS 10.7*  --  7.6  HGB 15.0 15.0 14.8  HCT 42.8 44.0 44.7  MCV 89.0  --  92.5  PLT 142*  --  143*   No results for input(s): CKTOTAL, CKMB, CKMBINDEX, TROPONINI in the last 168 hours. No results for input(s): LABPROT, INR in the last 72 hours.  Recent Labs  05/30/14 1904   COLORURINE YELLOW  LABSPEC 1.020  PHURINE 5.5  GLUCOSEU >1000*  HGBUR LARGE*  BILIRUBINUR NEGATIVE  KETONESUR 40*  PROTEINUR NEGATIVE  UROBILINOGEN 0.2  NITRITE NEGATIVE  LEUKOCYTESUR NEGATIVE       Component Value Date/Time   CHOL 168 06/02/2014 0810   CHOL 211 02/01/2011   TRIG 316* 06/02/2014 0810   TRIG 342 09/30/2013   TRIG 342 09/30/2013   TRIG 522 02/01/2011   TRIG 522 02/01/2011   HDL 29* 06/02/2014 0810   CHOLHDL 5.8 06/02/2014 0810   VLDL 63* 06/02/2014 0810   LDLCALC 76 06/02/2014 0810   LDLCALC 98 09/30/2013   LDLCALC 98 09/30/2013   Lab Results  Component Value Date   HGBA1C 7.8* 04/29/2014      Component Value Date/Time   LABOPIA NONE DETECTED 05/30/2014 1904   COCAINSCRNUR NONE DETECTED 05/30/2014 1904   LABBENZ NONE DETECTED 05/30/2014 1904   AMPHETMU NONE DETECTED 05/30/2014 1904   THCU NONE DETECTED 05/30/2014 1904   LABBARB NONE DETECTED 05/30/2014 1904     Recent Labs Lab 05/30/14 0714  ETH <5   I have personally reviewed the radiological images below and agree with the radiology interpretations.  Ct Head (brain) Wo Contrast 05/30/2014    1. Acute LEFT thalamic and LEFT internal capsule parenchymal hemorrhage  compatible with hypertensive hemorrhage. Intraventricular break through, with blood in both lateral ventricles, third and fourth ventricles.  2. No hydrocephalus.   MRI / MRA Head Wo Contrast 05/31/2014 1. Left thalamic hemorrhage probably represents hemorrhagic transformation of a left thalamic and posterior corona radiata infarct. Intra-axial hemorrhage, edema, and mild mass effect have not significantly changed. 2. Increased intraventricular and bilateral subarachnoid hemorrhage since yesterday. No ventriculomegaly. 3.Negative intracranial MRA. 4. Underlying mild for age nonspecific signal changes compatible with chronic small vessel disease.  Ct Cervical Spine Wo Contrast 05/30/2014    1. No acute fracture or traumatic  subluxation.  2. Multilevel spondylosis of the cervical spine.  3. Anterior and posterior fusion at C2-3 and C3-4 is likely acquired.  4. Osseous foramina are scratch the osseous foraminal narrowing is most pronounced on the left at C5-6 and C6-7.  5. Bilateral sub cm thyroid nodules. Sub-centimeter thyroid nodule(s) noted, too small to characterize, but most likely benign in the absence of known clinical risk factors for thyroid carcinoma.     Chest Port 1 View 05/30/2014    No active disease.    CUS - pending  2D echo - - Left ventricle: The cavity size was normal. Wall thickness was normal. Systolic function was normal. The estimated ejection fraction was in the range of 60% to 65%. Wall motion was normal; there were no regional wall motion abnormalities. Doppler parameters are consistent with abnormal left ventricular relaxation (grade 1 diastolic dysfunction). The E/e&' ratio is between 8-15, suggesting indeterminate LV filling pressure. - Left atrium: The atrium was normal in size.  Impressions: - Compared to the prior echo in 2012, there has been no signficant change.  PHYSICAL EXAM Exam: Gen: NAD Eyes: anicteric sclerae, moist conjunctivae                    CV: no MRG, no carotid bruits, no peripheral edema Mental Status: Alert, Oriented to person, place, month, year. Follows commands. Intact remote and recent memory, intact fund of knowledge, concentration and attention. Lungs clear to auscultation. No wheezing, rales. Neuro: Detailed Neurologic Exam Speech:    No aphasia, +dysarthria Cranial Nerves:    The pupils are equal, round, and reactive to light. Fundi flat.  EOMI. Conjugate gaze. Visual fields full.  Facial mildly deceased light touch. Mild right facial weakness. Tongue midline. Hearing intact. Shoulder shrug intact. Motor Observation:    no involuntary movements noted. Tone appears normal.  Strength:    Right hemiparesis. Improved from  admission. RUE 0/5 proximal but 4/5 bicep, 2/5 tricep and 4/5 hand grip. RLE 0/5 proximal, 3/5 knee flexion, 4/5 toe dorsiflexion. Sensation:  Decreased on the right UE and LE. Plantars upgoing right, equivocal left.    ASSESSMENT/PLAN Jeffrey [name] is a 74 y.o. male with history of hypertension, hyperlipidemia, coronary artery disease, and diabetes mellitus, presenting with right hemiparesis and speech difficulties.[name] He did not receive IV t-PA due to hemorrhage.  Stroke: Dominant - Acute LEFT thalamic and LEFT internal capsule ischemic infarct with resultant hemorrhagic transformation. Infarct secondary to small vessel disease  Resultant  Right hemiparesis and dysarthria  MRI - right BG hemorrhagic infarct.  MRA - Negative   Carotid Doppler - pending.   2D Echo - EF 60-65%. No cardiac source of emboli identified.  SCDs for VTE prophylaxis  Diet heart healthy/carb modified Room service appropriate?: Yes; Fluid consistency:: Thin  no antithrombotic prior to admission, now on no antithrombotic given hemorrhagic transformation  Patient counseled to  be compliant with his antithrombotic medications  Ongoing aggressive stroke risk factor management  Therapy recommendations:  CIR.   Recommend repeat CT in one month. If blood resolved, may resume aspirin 81 mg daily. (CT ordered)  Disposition: CIR. Admissions coordinator to follow  Hypertension  Home meds: no antihtn. meds PTA.  BP 135-175/70-93 past 24h (06/02/2014 @ 11:04 AM)       continue norvasc 10 mg daily  Add lisinopril 10mg   Hyperlipidemia  Home meds: No lipid lowering meds PTA  LDL 76, goal < 70  New lipitor 40 added  Continue statin at discharge  Diabetes  HgbA1c 7.8 in February 2016, goal < 7.0  Uncontrolled  At home with insulin pump  SSI  CBG monitoring  Tobacco abuse  Current smoker  Smoking cessation counseling provided  Pt is willing to quit  Other Stroke Risk  Factors  Advanced age  Obesity, Body mass index is 30.14 kg/(m^2).   Coronary artery disease  Other Active Problems  On enteric precautions this am due to loose stools. C Diff sample sent. Results pending.  Hospital Day #3   [place] for Pager information 06/02/2014 11:01 AM   I, the attending vascular neurologist, have personally obtained a history, examined the patient, evaluated laboratory data, individually viewed imaging studies and agree with radiology interpretations. I also obtained additional history from pt's son and wife at bedside. Together with the NP/PA, we formulated the assessment and plan of care which reflects our mutual decision.  I have made any additions or clarifications directly to the above note and agree with the findings and plan as currently documented.   75 yo M with PMH of HTN, HLD, DM, CAD admitted for left BG hemorrhagic infarct. CT repeat stable bleed. 2D echo unremarkable. CUS pending. A1C 7.8. Will repeat CT head in one month. If blood absorbed, may consider ASA 81mg . Bp still on the high side, will add lisinipril on top of norvasc. Control glucose with SSI. Await CIR placement.  Rosalin Hawking, MD PhD Stroke Neurology 06/02/2014 8:57 PM To contact Stroke Continuity provider, please refer to [email or web address] After hours, contact General Neurology

## 2014-06-03 ENCOUNTER — Encounter (HOSPITAL_COMMUNITY): Payer: Self-pay | Admitting: *Deleted

## 2014-06-03 ENCOUNTER — Inpatient Hospital Stay (HOSPITAL_COMMUNITY)
Admission: AD | Admit: 2014-06-03 | Discharge: 2014-06-05 | DRG: 056 | Disposition: A | Discharge status: Other Acute Inpatient Hospital | Payer: Medicare Other | Source: Intra-hospital | Attending: Physical Medicine & Rehabilitation | Admitting: Physical Medicine & Rehabilitation

## 2014-06-03 DIAGNOSIS — Z794 Long term (current) use of insulin: Secondary | ICD-10-CM | POA: Diagnosis not present

## 2014-06-03 DIAGNOSIS — I69151 Hemiplegia and hemiparesis following nontraumatic intracerebral hemorrhage affecting right dominant side: Secondary | ICD-10-CM | POA: Diagnosis present

## 2014-06-03 DIAGNOSIS — Z9861 Coronary angioplasty status: Secondary | ICD-10-CM

## 2014-06-03 DIAGNOSIS — I10 Essential (primary) hypertension: Secondary | ICD-10-CM | POA: Diagnosis present

## 2014-06-03 DIAGNOSIS — R918 Other nonspecific abnormal finding of lung field: Secondary | ICD-10-CM | POA: Diagnosis not present

## 2014-06-03 DIAGNOSIS — Z9641 Presence of insulin pump (external) (internal): Secondary | ICD-10-CM | POA: Diagnosis present

## 2014-06-03 DIAGNOSIS — R0902 Hypoxemia: Secondary | ICD-10-CM | POA: Diagnosis present

## 2014-06-03 DIAGNOSIS — I629 Nontraumatic intracranial hemorrhage, unspecified: Secondary | ICD-10-CM | POA: Diagnosis not present

## 2014-06-03 DIAGNOSIS — J189 Pneumonia, unspecified organism: Secondary | ICD-10-CM | POA: Diagnosis present

## 2014-06-03 DIAGNOSIS — I618 Other nontraumatic intracerebral hemorrhage: Secondary | ICD-10-CM

## 2014-06-03 DIAGNOSIS — E785 Hyperlipidemia, unspecified: Secondary | ICD-10-CM | POA: Diagnosis present

## 2014-06-03 DIAGNOSIS — I252 Old myocardial infarction: Secondary | ICD-10-CM

## 2014-06-03 DIAGNOSIS — K567 Ileus, unspecified: Secondary | ICD-10-CM | POA: Diagnosis present

## 2014-06-03 DIAGNOSIS — Z87891 Personal history of nicotine dependence: Secondary | ICD-10-CM | POA: Diagnosis present

## 2014-06-03 DIAGNOSIS — E1140 Type 2 diabetes mellitus with diabetic neuropathy, unspecified: Secondary | ICD-10-CM | POA: Diagnosis not present

## 2014-06-03 DIAGNOSIS — I2510 Atherosclerotic heart disease of native coronary artery without angina pectoris: Secondary | ICD-10-CM | POA: Diagnosis present

## 2014-06-03 DIAGNOSIS — K219 Gastro-esophageal reflux disease without esophagitis: Secondary | ICD-10-CM | POA: Diagnosis present

## 2014-06-03 DIAGNOSIS — E1149 Type 2 diabetes mellitus with other diabetic neurological complication: Secondary | ICD-10-CM | POA: Diagnosis present

## 2014-06-03 DIAGNOSIS — E669 Obesity, unspecified: Secondary | ICD-10-CM | POA: Diagnosis present

## 2014-06-03 DIAGNOSIS — E1142 Type 2 diabetes mellitus with diabetic polyneuropathy: Secondary | ICD-10-CM | POA: Diagnosis present

## 2014-06-03 DIAGNOSIS — A419 Sepsis, unspecified organism: Secondary | ICD-10-CM | POA: Diagnosis present

## 2014-06-03 DIAGNOSIS — R0689 Other abnormalities of breathing: Secondary | ICD-10-CM

## 2014-06-03 DIAGNOSIS — R140 Abdominal distension (gaseous): Secondary | ICD-10-CM

## 2014-06-03 DIAGNOSIS — R0602 Shortness of breath: Secondary | ICD-10-CM

## 2014-06-03 DIAGNOSIS — Z720 Tobacco use: Secondary | ICD-10-CM

## 2014-06-03 DIAGNOSIS — I152 Hypertension secondary to endocrine disorders: Secondary | ICD-10-CM | POA: Diagnosis present

## 2014-06-03 DIAGNOSIS — E1159 Type 2 diabetes mellitus with other circulatory complications: Secondary | ICD-10-CM | POA: Diagnosis present

## 2014-06-03 LAB — GLUCOSE, CAPILLARY
GLUCOSE-CAPILLARY: 157 mg/dL — AB (ref 70–99)
GLUCOSE-CAPILLARY: 169 mg/dL — AB (ref 70–99)
Glucose-Capillary: 158 mg/dL — ABNORMAL HIGH (ref 70–99)
Glucose-Capillary: 162 mg/dL — ABNORMAL HIGH (ref 70–99)

## 2014-06-03 MED ORDER — SORBITOL 70 % SOLN: 30.0000 mL | Freq: Every day | Status: DC | PRN

## 2014-06-03 MED ORDER — ATORVASTATIN CALCIUM 40 MG PO TABS
40.0000 mg | ORAL_TABLET | Freq: Every day | ORAL | Status: DC
  Administered 2014-06-03 – 2014-06-04 (×2): 40 mg via ORAL
  Filled 2014-06-03 (×3): qty 1

## 2014-06-03 MED ORDER — AMLODIPINE BESYLATE 10 MG PO TABS
10.0000 mg | ORAL_TABLET | Freq: Every day | ORAL | Status: DC
  Administered 2014-06-04 – 2014-06-05 (×2): 10 mg via ORAL
  Filled 2014-06-03 (×3): qty 1

## 2014-06-03 MED ORDER — PANTOPRAZOLE SODIUM 40 MG PO TBEC
40.0000 mg | DELAYED_RELEASE_TABLET | Freq: Every day | ORAL | Status: DC
  Administered 2014-06-04 – 2014-06-05 (×2): 40 mg via ORAL
  Filled 2014-06-03 (×3): qty 1

## 2014-06-03 MED ORDER — SENNOSIDES-DOCUSATE SODIUM 8.6-50 MG PO TABS
1.0000 | ORAL_TABLET | Freq: Two times a day (BID) | ORAL | Status: DC
  Administered 2014-06-03 – 2014-06-05 (×4): 1 via ORAL
  Filled 2014-06-03 (×6): qty 1

## 2014-06-03 MED ORDER — LISINOPRIL 10 MG PO TABS
10.0000 mg | ORAL_TABLET | Freq: Every day | ORAL | Status: DC
  Administered 2014-06-04 – 2014-06-05 (×2): 10 mg via ORAL
  Filled 2014-06-03 (×3): qty 1

## 2014-06-03 MED ORDER — ACETAMINOPHEN 325 MG PO TABS: 650.0000 mg | ORAL_TABLET | ORAL | Status: DC | PRN

## 2014-06-03 MED ORDER — ONDANSETRON HCL 4 MG/2ML IJ SOLN: 4.0000 mg | Freq: Four times a day (QID) | INTRAMUSCULAR | Status: DC | PRN

## 2014-06-03 MED ORDER — INSULIN ASPART 100 UNIT/ML ~~LOC~~ SOLN
0.0000 [IU] | Freq: Three times a day (TID) | SUBCUTANEOUS | Status: DC
  Administered 2014-06-03: 2 [IU] via SUBCUTANEOUS
  Administered 2014-06-04: 3 [IU] via SUBCUTANEOUS
  Administered 2014-06-04: 5 [IU] via SUBCUTANEOUS
  Administered 2014-06-04 – 2014-06-05 (×2): 2 [IU] via SUBCUTANEOUS

## 2014-06-03 MED ORDER — ONDANSETRON HCL 4 MG PO TABS: 4.0000 mg | ORAL_TABLET | Freq: Four times a day (QID) | ORAL | Status: DC | PRN

## 2014-06-03 MED ORDER — CETYLPYRIDINIUM CHLORIDE 0.05 % MT LIQD
7.0000 mL | Freq: Two times a day (BID) | OROMUCOSAL | Status: DC
  Administered 2014-06-03 – 2014-06-04 (×2): 7 mL via OROMUCOSAL

## 2014-06-03 MED ORDER — ACETAMINOPHEN 650 MG RE SUPP: 650.0000 mg | RECTAL | Status: DC | PRN

## 2014-06-03 MED ORDER — CHLORHEXIDINE GLUCONATE 0.12 % MT SOLN
15.0000 mL | Freq: Two times a day (BID) | OROMUCOSAL | Status: DC
  Administered 2014-06-03 – 2014-06-05 (×4): 15 mL via OROMUCOSAL
  Filled 2014-06-03 (×6): qty 15

## 2014-06-03 NOTE — Progress Notes (Signed)
Occupational Therapy Treatment Patient Details Name: Jeffrey [name] MRN: [id number] DOB: June 09, 1940 Today's Date: 06/03/2014    History of present illness 74 yo male with hx of HTN, HLD, CAD, and DM, found collapsed in carport with R weakness.  Dx of L thalamic and internal capsular hemorrhage with intraventricular extension.   OT comments  Pt with significantly less dizziness this date.  Tolerated EOB sitting with min guard assist to supervision.  Able to move into standing in prep for ADLs with mod A for brief periods before buckling or Rt knee   Follow Up Recommendations       Equipment Recommendations   (defer to CIR )    Recommendations for Other Services Rehab consult    Precautions / Restrictions Precautions Precautions: Fall Precaution Comments: due to right sided weakness       Mobility Bed Mobility Overal bed mobility: Needs Assistance Bed Mobility: Rolling;Sidelying to Sit;Sit to Sidelying Rolling: Min assist Sidelying to sit: Mod assist     Sit to sidelying: Mod assist General bed mobility comments: Assist to flex Rt knee for rolling and cues for sequencing. Assist to lift truknk from bed and assist to lift LEs onto bed    Transfers Overall transfer level: Needs assistance Equipment used: 1 person hand held assist Transfers: Sit to/from Stand Sit to Stand: Mod assist Stand pivot transfers: +2 physical assistance;Max assist       General transfer comment: Pt moved sit to stand with mod A with facilitation of hips into flexion to move into standing, then facilitation at hips for extension     Balance Overall balance assessment: Needs assistance Sitting-balance support: Feet supported;Single extremity supported Sitting balance-Leahy Scale: Fair Sitting balance - Comments: with glasses on (and Rt lens occluded), pt reports dizziness improved today.  he was albe to maintain sitting balance with min gaurd assist while performing reaching activities withe  each UE  Postural control: Right lateral lean;Posterior lean Standing balance support: Single extremity supported Standing balance-Leahy Scale: Poor Standing balance comment: Mod A to maintain standing for brief periods.  Rt knee buckles causing pt to sit                    ADL Overall ADL's : Needs assistance/impaired     Grooming: Min guard;Sitting                                        Vision                 Additional Comments: Pt reports diplopia is about the same, but reports that occluded glasses reduce diplopia    Perception     Praxis      Cognition   Behavior During Therapy: WFL for tasks assessed/performed Overall Cognitive Status: Impaired/Different from baseline Area of Impairment: Following commands;Problem solving        Following Commands: Follows one step commands with increased time     Problem Solving: Decreased initiation;Difficulty sequencing;Slow processing      Extremity/Trunk Assessment               Exercises Other Exercises Other Exercises: worked on controlled movement or Rt UE with pt controlling amount of force    Shoulder Instructions       General Comments      Pertinent Vitals/ Pain       Pain Assessment: Faces Faces Pain  Scale: Hurts a little bit Pain Location: low back pain Pain Descriptors / Indicators: Grimacing Pain Intervention(s): Monitored during session  Home Living                                          Prior Functioning/Environment              Frequency       Progress Toward Goals  OT Goals(current goals can now be found in the care plan section)  Progress towards OT goals: Progressing toward goals  Acute Rehab OT Goals Patient Stated Goal: to get better  ADL Goals Pt Will Perform Grooming: with min guard assist;sitting Pt Will Perform Upper Body Bathing: with set-up;with supervision;sitting Pt Will Perform Lower Body Bathing: with mod  assist;sit to/from stand Pt Will Transfer to Toilet: with mod assist;stand pivot transfer;bedside commode Pt Will Perform Toileting - Clothing Manipulation and hygiene: with mod assist;sit to/from stand Additional ADL Goal #1: Pt and family will be independent with vision exercises  Additional ADL Goal #2: Pt wil tolerate EOB or OOB activities with dizziness no greater than 3/10  Plan Discharge plan remains appropriate    Co-evaluation                 End of Session     Activity Tolerance Patient tolerated treatment well   Patient Left in bed;with call bell/phone within reach   Nurse Communication          Time: 3154-0086 OT Time Calculation (min): 27 min  Charges: OT General Charges $OT Visit: 1 Procedure OT Treatments $Neuromuscular Re-education: 23-37 mins  Jeffrey [name] M 06/03/2014, 4:16 PM

## 2014-06-03 NOTE — H&P (Signed)
Physical Medicine and Rehabilitation Admission H&P   Chief Complaint  Patient presents with  . Code Stroke  : Chief complaint: Dizziness   HPI: Jeffrey [name] is a 74 y.o. right handed male with history of hypertension, diabetes mellitus and peripheral neuropathy maintained on Invokana and has an insulin pump(Dr. Buddy Duty), coronary artery disease with balloon angioplasty. Independent prior to admission living with his wife. Presented 05/30/2014 with right sided weakness and slurred speech. Cranial CT scan showed acute left thalamic and left internal capsule parenchymal hemorrhage compatible with hypertensive hemorrhage. MRI again notes left thalamic hemorrhage showing some increased intraventricular and bilateral subarachnoid hemorrhage since recent cranial CT. MRA of the head was negative. Carotid Dopplers with no ICA stenosis. Echocardiogram with ejection fraction of 73% grade 1 diastolic dysfunction. Neurology follow-up with conservative care. Tolerating a regular consistency diet. Bouts of loose stool with C. difficile specimen 06/02/2014 negative. Physical therapy evaluation completed and ongoing with recommendations of physical medicine rehabilitation consult. Patient was admitted for a comprehensive rehabilitation program  ROS Review of Systems  Gastrointestinal:   GERD  Genitourinary: Positive for frequency.  Musculoskeletal: Positive for myalgias.  Neurological: Positive for dizziness Remaining review of systems negative  Past Medical History  Diagnosis Date  . CAD (coronary artery disease)     PCI distal RCA ...2004, residual 70% LAD / ...nuclear...03/2007...no ischemia.Marland KitchenMarland Kitchenpreserved LV / nuclear...03/03/2009...inferior scar..no ischemia..EF 51%  . Dyslipidemia     takes Atorvastatin daily  . Internal hemorrhoids   . Gout     takes Allopurinol daily and Colchicine as needed  . Seasonal allergic rhinitis   . Right carotid bruit    . Cyst of nasopharynx     per ENT Wilburn Cornelia  . Myocardial infarction 80yrs ago  . Peripheral edema     takes Lasix daily  . HTN (hypertension)     takes Cardura,Metoprolol,[name],and Amlodipine daily  . Arthritis   . GERD (gastroesophageal reflux disease)     takes Omeprazole daily  . History of colon polyps   . Type 2 diabetes, uncontrolled, with neuropathy 1989    takes Invokana daily and has an insulin pump (Dr. [name])  . 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 Cataract extraction Bilateral 2010  . Elbow surgery Right 1997  . Balloon angioplasty, artery  1992, 2004    CAD, Dr. Ron Parker  . Colonoscopy  04/03/2002    adenomatous polyp, int hemorrhoids  . Colonoscopy  07/18/2007    normal (Dr. Fuller Plan)  . Rotator cuff repair  09/2011    left, with subacromial decompression  . Nasal septum surgery    . Colonoscopy  09/26/2012    tubular adenoma, sm int hem, rpt 5 yrs Fuller Plan)  . Tonsillectomy and adenoidectomy    . Eye lids raised    . Cardiac catheterization  2004  . Polypectomy N/A 05/27/2013    Procedure: ENDOSCOPIC NASOPHARYNGEAL MASS; Surgeon: [name], MD   Family History  Problem Relation Age of Onset  . Alcohol abuse Father   . Coronary artery disease Neg Hx   . Stroke Neg Hx   . Cancer Neg Hx   . Diabetes Neg Hx   . Colon cancer Neg Hx    Social History:  reports that he has quit smoking. He has never used smokeless tobacco. He reports that he does not drink alcohol or use illicit drugs. Allergies:  Allergies  Allergen  Reactions  . Niacin Other (See Comments)    REACTION: intolerant,not allergic="flushing,hot flashes,turning red"   Medications Prior to Admission  Medication Sig Dispense Refill  .  Canagliflozin (INVOKANA) 300 MG TABS Take 1 tablet (300 mg total) by mouth daily. 30 tablet   . fluticasone (FLONASE) 50 MCG/ACT nasal spray Place 2 sprays into both nostrils daily. 16 g 6  . Insulin Infusion Pump (H-TRON V100 INSULIN PUMP) MISC Patient uses Novolog in pump.    . meclizine (ANTIVERT) 25 MG tablet Take 1 tablet (25 mg total) by mouth 3 (three) times daily as needed for dizziness (watch for sedation). 20 tablet 0    Home: Home Living Family/patient expects to be discharged to:: Inpatient rehab Living Arrangements: Spouse/significant other Available Help at Discharge: Family, Available 24 hours/day Type of Home: House Home Access: Stairs to enter [employer] of Steps: 3 Entrance Stairs-Rails: Right, Left, Can reach both Home Layout: One level Lives With: Spouse  Functional History: Prior Function Level of Independence: Independent Comments: Retired Geophysicist/field seismologist Status:  Mobility: Bed Mobility Overal bed mobility: Needs Assistance Bed Mobility: Rolling, Sidelying to Sit, Sit to Sidelying Rolling: Mod assist Sidelying to sit: Mod assist Supine to sit: Max assist Sit to sidelying: Mod assist General bed mobility comments: Pt with complaint of back pain with bed mobility. Instructed him in log rolling technique to reduce back pain. He requires mod A to incorporate Rt side as he has poor awareness of movement on Rt.  Transfers Overall transfer level: Needs assistance Equipment used: 1 person hand held assist Transfers: Sit to/from Stand, Stand Pivot Transfers Sit to Stand: Mod assist, Max assist Stand pivot transfers: +2 physical assistance (not tested on evaluation) General transfer comment: Did not attempt due to dizziness       ADL: ADL Overall ADL's : Needs assistance/impaired Eating/Feeding: Modified independent, Bed level Grooming: Wash/dry hands, Wash/dry face, Oral care, Brushing hair, Supervision/safety, Bed  level (min A seated ) Upper Body Bathing: Minimal assitance, Sitting Lower Body Bathing: Moderate assistance, Bed level Upper Body Dressing : Moderate assistance, Sitting Lower Body Dressing: Bed level, Total assistance Toilet Transfer: Total assistance Toilet Transfer Details (indicate cue type and reason): unable to attempt due to dizziness  Toileting- Clothing Manipulation and Hygiene: Maximal assistance, Bed level Functional mobility during ADLs: Moderate assistance (to EOB ) General ADL Comments: Pt unable to tolerate sit to stand due to dizziness. Focused on seated tasks in attempts to acclimated to upright postures. Pt instructed in gaze stabilization, but reports the objects roll   Cognition: Cognition Overall Cognitive Status: Impaired/Different from baseline Arousal/Alertness: Awake/alert Orientation Level: Oriented to person, Oriented to place, Oriented to situation, Disoriented to time Attention: Sustained Sustained Attention: Appears intact Memory: Impaired Memory Impairment: Storage deficit, Retrieval deficit, Decreased recall of new information Awareness: Impaired Awareness Impairment: Emergent impairment Problem Solving: Impaired Problem Solving Impairment: Verbal complex, Functional basic, Functional complex Executive Function: Self Monitoring, Self Correcting Self Monitoring: Impaired Self Monitoring Impairment: Verbal complex, Functional complex Self Correcting: Impaired Self Correcting Impairment: Verbal complex, Functional complex Safety/Judgment: Impaired Comments: required cues to recall and return demonstration of call bell for assist Cognition Arousal/Alertness: Awake/alert Behavior During Therapy: WFL for tasks assessed/performed Overall Cognitive Status: Impaired/Different from baseline  Physical Exam: Blood pressure 138/70, pulse 75, temperature 98.2 F (36.8 C), temperature source Oral, resp. rate 20, weight 89.9 kg (198 lb 3.1 oz), SpO2 95  %. Physical Exam Constitutional: He is oriented to person, place, and time. He appears well-developed.  HENT: oral mucosa pink and moist Head: Normocephalic.  Eyes: EOM are normal.  Neck: Normal range of motion. Neck supple. No thyromegaly present.  Cardiovascular: Normal rate and regular rhythm. no murmur Respiratory: Effort normal and breath sounds normal. No respiratory distress.  GI: Soft. Bowel sounds are normal. He exhibits no distension.  Neurological: He is alert and oriented to person, place, and time.  Patient makes good eye contact with examiner. He is oriented to person, place and date of birth. Follow simple commands. Has difficulty with focus and sustaining attention. Is aware why he's in the hospital and that he is in rehab. Speech remains dysarthric. Has difficulties consistently initiating movement of the right arm more than the right leg. RUE: 3 to 3+/5 delt,bicep, tricep, HI. RLE 3+ to 4/5 HF, KE ADF/APF but also still inconsistent. Sensation tr/2 RUE and 1+/2 RLE. LUE and LLE display fairly functional movement. Sensation appears intact to light touch and pain in the left arm and leg. Skin: Skin is warm and dry.  Psychiatric: He has a normal mood and affect. His behavior is normal    Lab Results Last 48 Hours    Results for orders placed or performed during the hospital encounter of 05/30/14 (from the past 48 hour(s))  Glucose, capillary Status: Abnormal   Collection Time: 06/01/14 11:40 AM  Result Value Ref Range   Glucose-Capillary 125 (H) 70 - 99 mg/dL  Glucose, capillary Status: Abnormal   Collection Time: 06/01/14 4:38 PM  Result Value Ref Range   Glucose-Capillary 155 (H) 70 - 99 mg/dL  Glucose, capillary Status: Abnormal   Collection Time: 06/01/14 10:32 PM  Result Value Ref Range   Glucose-Capillary 148 (H) 70 - 99 mg/dL  Glucose, capillary Status: Abnormal   Collection Time: 06/02/14 6:36 AM   Result Value Ref Range   Glucose-Capillary 162 (H) 70 - 99 mg/dL  Lipid panel Status: Abnormal   Collection Time: 06/02/14 8:10 AM  Result Value Ref Range   Cholesterol 168 0 - 200 mg/dL   Triglycerides 316 (H) <150 mg/dL   HDL 29 (L) >39 mg/dL   Total CHOL/HDL Ratio 5.8 RATIO   VLDL 63 (H) 0 - 40 mg/dL   LDL Cholesterol 76 0 - 99 mg/dL    Comment:   Total Cholesterol/HDL:CHD Risk Coronary Heart Disease Risk Table  Men Women 1/2 Average Risk 3.4 3.3 Average Risk 5.0 4.4 2 X Average Risk 9.6 7.1 3 X Average Risk 23.4 11.0   Use the calculated Patient Ratio above and the CHD Risk Table to determine the patient's CHD Risk.   ATP III CLASSIFICATION (LDL): <100 mg/dL Optimal 100-129 mg/dL Near or Above  Optimal 130-159 mg/dL Borderline 160-189 mg/dL High >190 mg/dL Very High   Glucose, capillary Status: Abnormal   Collection Time: 06/02/14 10:56 AM  Result Value Ref Range   Glucose-Capillary 180 (H) 70 - 99 mg/dL  Clostridium Difficile by PCR Status: None   Collection Time: 06/02/14 11:00 AM  Result Value Ref Range   C difficile by pcr NEGATIVE NEGATIVE  Glucose, capillary Status: Abnormal   Collection Time: 06/02/14 4:31 PM  Result Value Ref Range   Glucose-Capillary 138 (H) 70 - 99 mg/dL  Glucose, capillary Status: Abnormal   Collection Time: 06/02/14 10:14 PM  Result Value Ref Range   Glucose-Capillary 116 (H) 70 - 99 mg/dL   Comment 1 Notify RN    Comment 2 Document in Chart   Glucose, capillary Status: Abnormal   Collection Time: 06/03/14 6:41  AM  Result Value Ref Range   Glucose-Capillary 158 (H) 70 - 99 mg/dL   Comment 1 Notify RN    Comment 2 Document in Chart       Imaging Results (Last 48 hours)    No results found.        Medical Problem List and Plan: 1. Functional deficits secondary to left thalamic ICH 2. DVT Prophylaxis/Anticoagulation: SCDs. Monitor for any signs of DVT 3. Pain Management: Tylenol as needed 4. Hypertension. Norvasc 10 mg daily, lisinopril 10 mg daily. Monitor with increased mobility 5. Neuropsych: This patient is capable of making decisions on his own behalf. 6. Skin/Wound Care: Routine skin checks 7. Fluids/Electrolytes/Nutrition: Strict I and O's with follow-up chemistries 8. Diabetes mellitus with peripheral neuropathy. Hemoglobin A1c 7.8. Sliding scale insulin. Patient on Invokana 3 mg daily prior to admission and has an insulin pump followed by Dr. Buddy Duty. Resume as needed. Check blood sugars before meals and at bedtime 9. CAD with balloon angioplasty. No chest pain or shortness of breath    Post Admission Physician Evaluation: 1. Functional deficits secondary to left ICH. 2. Patient is admitted to receive collaborative, interdisciplinary care between the physiatrist, rehab nursing staff, and therapy team. 3. Patient's level of medical complexity and substantial therapy needs in context of that medical necessity cannot be provided at a lesser intensity of care such as a SNF. 4. Patient has experienced substantial functional loss from his/her baseline which was documented above under the "Functional History" and "Functional Status" headings. Judging by the patient's diagnosis, physical exam, and functional history, the patient has potential for functional progress which will result in measurable gains while on inpatient rehab. These gains will be of substantial and practical use upon discharge in facilitating mobility and self-care at the household level. 5. Physiatrist will provide 24 hour management of medical needs as well as oversight of the therapy plan/treatment and provide guidance as appropriate regarding the interaction of the two. 6. 24 hour rehab nursing will  assist with bladder management, bowel management, safety, skin/wound care, disease management, medication administration, pain management and patient education and help integrate therapy concepts, techniques,education, etc. 7. PT will assess and treat for/with: Lower extremity strength, range of motion, stamina, balance, functional mobility, safety, adaptive techniques and equipment, NMR, stroke education, caregiver education, pain mgt, ego support . Goals are: supervision to min assist. 8. OT will assess and treat for/with: ADL's, functional mobility, safety, upper extremity strength, adaptive techniques and equipment, NMR, stroke education, family education, community reintegration. Goals are: supervision to min assist. Therapy may proceed with showering this patient. 9. SLP will assess and treat for/with: cognition,communication. Goals are: supervision to min assist. 10. Case Management and Social Worker will assess and treat for psychological issues and discharge planning. 11. Team conference will be held weekly to assess progress toward goals and to determine barriers to discharge. 12. Patient will receive at least 3 hours of therapy per day at least 5 days per week. 13. ELOS: 16-22 days  14. Prognosis: good     Meredith Staggers, MD, [facility] 06/03/2014

## 2014-06-03 NOTE — Progress Notes (Signed)
Meredith Staggers, MD Physician Signed Physical Medicine and Rehabilitation Consult Note 06/02/2014 6:11 AM  Related encounter: ED to Hosp-Admission (Discharged) from 05/30/2014 in [facility] Collapse All        Physical Medicine and Rehabilitation Consult Reason for Consult: Acute left thalamic and left internal capsule parenchymal hemorrhage Referring Physician: Dr. Leonie Man   HPI: Jeffrey [name] is a 74 y.o. right handed male with history of hypertension, diabetes mellitus and peripheral neuropathy, coronary artery disease with balloon angioplasty. Independent prior to admission living with his wife. Presented 05/30/2014 with right sided weakness and slurred speech. Cranial CT scan showed acute left thalamic and left internal capsule parenchymal hemorrhage compatible with hypertensive hemorrhage. MRI again notes left thalamic hemorrhage showing some increased intraventricular and bilateral subarachnoid hemorrhage since recent cranial CT. MRA of the head was negative. Carotid Dopplers with no ICA stenosis. Echocardiogram with ejection fraction of 89% grade 1 diastolic dysfunction. Neurology follow-up with conservative care. Tolerating a regular consistency diet. Bouts of loose stool with C. difficile specimen pending. Physical therapy evaluation completed and ongoing with recommendations of physical medicine rehabilitation consult.   Review of Systems  Gastrointestinal:   GERD  Genitourinary: Positive for frequency.  Musculoskeletal: Positive for myalgias.  Neurological: Positive for dizziness.   Past Medical History  Diagnosis Date  . CAD (coronary artery disease)     PCI distal RCA ...2004, residual 70% LAD / ...nuclear...03/2007...no ischemia.Marland KitchenMarland Kitchenpreserved LV / nuclear...03/03/2009...inferior scar..no ischemia..EF 51%  . Dyslipidemia     takes Atorvastatin daily  . Internal hemorrhoids   . Gout      takes Allopurinol daily and Colchicine as needed  . Seasonal allergic rhinitis   . Right carotid bruit   . Cyst of nasopharynx     per ENT Wilburn Cornelia  . Myocardial infarction 34yrs ago  . Peripheral edema     takes Lasix daily  . HTN (hypertension)     takes Cardura,Metoprolol,[name],and Amlodipine daily  . Arthritis   . GERD (gastroesophageal reflux disease)     takes Omeprazole daily  . History of colon polyps   . Type 2 diabetes, uncontrolled, with neuropathy 1989    takes Invokana daily and has an insulin pump (Dr. [name])  . 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 Cataract extraction Bilateral 2010  . Elbow surgery Right 1997  . Balloon angioplasty, artery  1992, 2004    CAD, Dr. Ron Parker  . Colonoscopy  04/03/2002    adenomatous polyp, int hemorrhoids  . Colonoscopy  07/18/2007    normal (Dr. Fuller Plan)  . Rotator cuff repair  09/2011    left, with subacromial decompression  . Nasal septum surgery    . Colonoscopy  09/26/2012    tubular adenoma, sm int hem, rpt 5 yrs Fuller Plan)  . Tonsillectomy and adenoidectomy    . Eye lids raised    . Cardiac catheterization  2004  . Polypectomy N/A 05/27/2013    Procedure: ENDOSCOPIC NASOPHARYNGEAL MASS; Surgeon: [name], MD   Family History  Problem Relation Age of Onset  . Alcohol abuse Father   . Coronary artery disease Neg Hx   . Stroke Neg Hx   . Cancer Neg Hx   . Diabetes Neg Hx   . Colon cancer Neg Hx    Social History:  reports that he has quit smoking. He has never  used smokeless tobacco. He reports that he does not drink alcohol or use illicit drugs. Allergies:  Allergies  Allergen Reactions  . Niacin Other (See Comments)    REACTION: intolerant,not  allergic="flushing,hot flashes,turning red"   Medications Prior to Admission  Medication Sig Dispense Refill  . Canagliflozin (INVOKANA) 300 MG TABS Take 1 tablet (300 mg total) by mouth daily. 30 tablet   . fluticasone (FLONASE) 50 MCG/ACT nasal spray Place 2 sprays into both nostrils daily. 16 g 6  . Insulin Infusion Pump (H-TRON V100 INSULIN PUMP) MISC Patient uses Novolog in pump.    . meclizine (ANTIVERT) 25 MG tablet Take 1 tablet (25 mg total) by mouth 3 (three) times daily as needed for dizziness (watch for sedation). 20 tablet 0    Home: Home Living Family/patient expects to be discharged to:: Inpatient rehab Living Arrangements: Alone, Spouse/significant other Available Help at Discharge: Family  Functional History: Prior Function Level of Independence: Independent Comments: Retired Tax adviser Status:  Mobility: Bed Mobility Overal bed mobility: Needs Assistance Bed Mobility: Rolling, Supine to Sit Rolling: Mod assist Supine to sit: Max assist Transfers Overall transfer level: Needs assistance Equipment used: 1 person hand held assist Transfers: Sit to/from Stand, Stand Pivot Transfers Sit to Stand: Mod assist, Max assist Stand pivot transfers: +2 physical assistance (not tested on evaluation) General transfer comment: Trial of RW, with R hand supported on walker, better with HHA      ADL:    Cognition: Cognition Overall Cognitive Status: Within Functional Limits for tasks assessed Orientation Level: Oriented X4 Cognition Arousal/Alertness: Awake/alert Behavior During Therapy: WFL for tasks assessed/performed Overall Cognitive Status: Within Functional Limits for tasks assessed  Blood pressure 149/76, pulse 76, temperature 98.4 F (36.9 C), temperature source Oral, resp. rate 18, weight 89.9 kg (198 lb 3.1 oz), SpO2 97 %. Physical Exam  Vitals reviewed. Constitutional: He is oriented to person, place, and time. He  appears well-developed.  HENT:  Head: Normocephalic.  Eyes: EOM are normal.  Neck: Normal range of motion. Neck supple. No thyromegaly present.  Cardiovascular: Normal rate and regular rhythm.  Respiratory: Effort normal and breath sounds normal. No respiratory distress.  GI: Soft. Bowel sounds are normal. He exhibits no distension.  Neurological: He is alert and oriented to person, place, and time.  Patient makes good eye contact with examiner. He is oriented to person, place and date of birth. Follow simple commands. Speech dysarthric. Has difficulties consistently initiating movement of the right arm more than the right leg. RUE: 3 to 3+/5. RLE 3+ to 4/5 but inconsistent. Sensation tr/2 RUE and 1+/2 RLE.  Skin: Skin is warm and dry.  Psychiatric: He has a normal mood and affect. His behavior is normal.     Lab Results Last 24 Hours    Results for orders placed or performed during the hospital encounter of 05/30/14 (from the past 24 hour(s))  Glucose, capillary Status: Abnormal   Collection Time: 06/01/14 6:35 AM  Result Value Ref Range   Glucose-Capillary 121 (H) 70 - 99 mg/dL   Comment 1 Notify RN    Comment 2 Document in Chart   Glucose, capillary Status: Abnormal   Collection Time: 06/01/14 11:40 AM  Result Value Ref Range   Glucose-Capillary 125 (H) 70 - 99 mg/dL  Glucose, capillary Status: Abnormal   Collection Time: 06/01/14 4:38 PM  Result Value Ref Range   Glucose-Capillary 155 (H) 70 - 99 mg/dL  Glucose, capillary Status: Abnormal   Collection Time: 06/01/14 10:32 PM  Result Value Ref Range   Glucose-Capillary 148 (H) 70 - 99 mg/dL      Imaging Results (Last 48 hours)    Mr Virgel Paling Wo Contrast  05/31/2014 CLINICAL DATA: 74 year old male with found down, left thalamic hemorrhage diagnosed on CT. Initial encounter. EXAM: MRI HEAD WITHOUT CONTRAST MRA HEAD WITHOUT CONTRAST TECHNIQUE:  Multiplanar, multiecho pulse sequences of the brain and surrounding structures were obtained without intravenous contrast. Angiographic images of the head were obtained using MRA technique without contrast. COMPARISON: Head CT without contrast 05/30/2014. FINDINGS: MRI HEAD FINDINGS T2 weighted heterogeneous and T1 weighted mostly isointense blood products re - identified in the dorsal left thalamus, encompassing 19 x 30 x 21 mm (AP by transverse by CC), for an estimated blood volume of 6 mL. Edema and secondary mass effect around the thalamic hemorrhage have not significantly changed. Edema does track into the left midbrain. The diffusion signal abnormality present does primarily correspond to the hemorrhage, but there is restricted diffusion in the posterior left corona radiata which does not definitely correspond to blood products (series 4, image 30). No other restricted diffusion identified. Intraventricular extension re- identified and has increased. No ventriculomegaly. There is bilateral subarachnoid hemorrhage which is new or increased (bilateral sylvian fissures series 7, image 15, bilateral occipital sulci image 10, and superior cerebellar folia image 10). No subdural or extradural blood identified. Basilar cisterns remain patent. Major intracranial vascular flow voids are preserved. Elsewhere there is scattered mild to moderate for age nonspecific cerebral white matter T2 and FLAIR hyperintensity. No cortical encephalomalacia identified. Right deep gray matter nuclei are within normal limits. Visible internal auditory structures appear normal. Mastoids are clear. Trace paranasal sinus mucosal thickening. Postoperative changes to the globes. Negative pituitary, cervicomedullary junction and visualized cervical spine. Normal bone marrow signal. Visualized scalp soft tissues are within normal limits. MRA HEAD FINDINGS Antegrade flow in the posterior circulation with dominant distal left vertebral  artery. Patent PICA origins. Patent vertebrobasilar junction. No basilar stenosis. SCA and PCA origins are normal. Posterior communicating arteries are diminutive or absent. Bilateral PCA branches including the left P1 segment are within normal limits. Antegrade flow in both ICA siphons no siphon stenosis. Normal ophthalmic artery origins. Normal carotid termini. Normal MCA and ACA origins. Mildly dominant left A1 segment. Anterior communicating artery and visualized bilateral ACA branches are within normal limits. Visualized bilateral MCA branches are within normal limits. IMPRESSION: 1. Left thalamic hemorrhage probably represents hemorrhagic transformation of a left thalamic and posterior corona radiata infarct. Intra-axial hemorrhage, edema, and mild mass effect have not significantly changed. 2. Increased intraventricular and bilateral subarachnoid hemorrhage since yesterday. No ventriculomegaly. 3. Negative intracranial MRA. 4. Underlying mild for age nonspecific signal changes compatible with chronic small vessel disease. Study discussed by telephone with Neurology Dr. Wallie Char on 05/31/2014 at 1626 hours. Electronically Signed By: Genevie Ann M.D. On: 05/31/2014 16:29   Mr Brain Wo Contrast  05/31/2014 CLINICAL DATA: 74 year old male with found down, left thalamic hemorrhage diagnosed on CT. Initial encounter. EXAM: MRI HEAD WITHOUT CONTRAST MRA HEAD WITHOUT CONTRAST TECHNIQUE: Multiplanar, multiecho pulse sequences of the brain and surrounding structures were obtained without intravenous contrast. Angiographic images of the head were obtained using MRA technique without contrast. COMPARISON: Head CT without contrast 05/30/2014. FINDINGS: MRI HEAD FINDINGS T2 weighted heterogeneous and T1 weighted mostly isointense blood products re - identified in the dorsal left thalamus, encompassing 19 x 30 x 21 mm (AP by transverse by CC), for an estimated blood volume of 6  mL. Edema and  secondary mass effect around the thalamic hemorrhage have not significantly changed. Edema does track into the left midbrain. The diffusion signal abnormality present does primarily correspond to the hemorrhage, but there is restricted diffusion in the posterior left corona radiata which does not definitely correspond to blood products (series 4, image 30). No other restricted diffusion identified. Intraventricular extension re- identified and has increased. No ventriculomegaly. There is bilateral subarachnoid hemorrhage which is new or increased (bilateral sylvian fissures series 7, image 15, bilateral occipital sulci image 10, and superior cerebellar folia image 10). No subdural or extradural blood identified. Basilar cisterns remain patent. Major intracranial vascular flow voids are preserved. Elsewhere there is scattered mild to moderate for age nonspecific cerebral white matter T2 and FLAIR hyperintensity. No cortical encephalomalacia identified. Right deep gray matter nuclei are within normal limits. Visible internal auditory structures appear normal. Mastoids are clear. Trace paranasal sinus mucosal thickening. Postoperative changes to the globes. Negative pituitary, cervicomedullary junction and visualized cervical spine. Normal bone marrow signal. Visualized scalp soft tissues are within normal limits. MRA HEAD FINDINGS Antegrade flow in the posterior circulation with dominant distal left vertebral artery. Patent PICA origins. Patent vertebrobasilar junction. No basilar stenosis. SCA and PCA origins are normal. Posterior communicating arteries are diminutive or absent. Bilateral PCA branches including the left P1 segment are within normal limits. Antegrade flow in both ICA siphons no siphon stenosis. Normal ophthalmic artery origins. Normal carotid termini. Normal MCA and ACA origins. Mildly dominant left A1 segment. Anterior communicating artery and visualized bilateral ACA branches are within  normal limits. Visualized bilateral MCA branches are within normal limits. IMPRESSION: 1. Left thalamic hemorrhage probably represents hemorrhagic transformation of a left thalamic and posterior corona radiata infarct. Intra-axial hemorrhage, edema, and mild mass effect have not significantly changed. 2. Increased intraventricular and bilateral subarachnoid hemorrhage since yesterday. No ventriculomegaly. 3. Negative intracranial MRA. 4. Underlying mild for age nonspecific signal changes compatible with chronic small vessel disease. Study discussed by telephone with Neurology Dr. Wallie Char on 05/31/2014 at 1626 hours. Electronically Signed By: Genevie Ann M.D. On: 05/31/2014 16:29     Assessment/Plan: Diagnosis: left thalamic ICH 1. Does the need for close, 24 hr/day medical supervision in concert with the patient's rehab needs make it unreasonable for this patient to be served in a less intensive setting? Yes 2. Co-Morbidities requiring supervision/potential complications: htn, DM2, gout  3. Due to bladder management, bowel management, safety, skin/wound care, disease management, medication administration, pain management and patient education, does the patient require 24 hr/day rehab nursing? Yes 4. Does the patient require coordinated care of a physician, rehab nurse, PT (1-2 hrs/day, 5 days/week), OT (1-2 hrs/day, 5 days/week) and SLP (1-2 hrs/day, 5 days/week) to address physical and functional deficits in the context of the above medical diagnosis(es)? Yes Addressing deficits in the following areas: balance, endurance, locomotion, strength, transferring, bowel/bladder control, bathing, dressing, feeding, grooming, toileting, speech and psychosocial support 5. Can the patient actively participate in an intensive therapy program of at least 3 hrs of therapy per day at least 5 days per week? Yes 6. The potential for patient to make measurable gains while on inpatient rehab is  excellent 7. Anticipated functional outcomes upon discharge from inpatient rehab are supervision and min assist with PT, supervision and min assist with OT, modified independent and supervision with SLP. 8. Estimated rehab length of stay to reach the above functional goals is: 15-18 days 9. Does the patient have adequate social supports and living  environment to accommodate these discharge functional goals? Yes 10. Anticipated D/C setting: Home 11. Anticipated post D/C treatments: HH therapy and Outpatient therapy 12. Overall Rehab/Functional Prognosis: excellent  RECOMMENDATIONS: This patient's condition is appropriate for continued rehabilitative care in the following setting: CIR Patient has agreed to participate in recommended program. Yes Note that insurance prior authorization may be required for reimbursement for recommended care.  Comment: Rehab Admissions Coordinator to follow up.  Thanks,  Meredith Staggers, MD, Mellody Drown     06/02/2014       Revision History     Date/Time User Provider Type Action   06/02/2014 11:00 AM Meredith Staggers, MD Physician Sign   06/02/2014 6:59 AM Cathlyn Parsons, PA-C Physician Assistant Pend   View Details Report       Routing History     Date/Time From To Method   06/02/2014 11:00 AM Meredith Staggers, MD Meredith Staggers, MD In Basket   06/02/2014 11:00 AM Meredith Staggers, MD [name], MD In [facility]

## 2014-06-03 NOTE — PMR Pre-admission (Signed)
PMR Admission Coordinator Pre-Admission Assessment  Patient: Jeffrey [name] is an 74 y.o., male MRN: [id number] DOB: 11-09-1940 Height:   Weight: 89.9 kg (198 lb 3.1 oz)              Insurance Information HMO: yes    PPO:      PCP:      IPA:      80/20:      OTHER: medicare advantage plan PRIMARY: Blue Medicare      Policy#: [id number]      Subscriber: pt CM Name: Dallas Breeding      Phone#: [phone]     Fax#: [phone] Pre-Cert#: [id number]      Employer: retired. update in 7 days Benefits:  Phone #: [phone]     Name: 06/03/14 Eff. Date: 03/07/14     Deduct: none      Out of Pocket Max: $3950      Life Max: none CIR: $275 per day days 1-6 then covers 100%      SNF: no copay days 1-20; $75 per day days 21-49; $100 copay per day days 50-100 Outpatient: $40 copay per visit     Co-Pay: no visit limit Home Health: 100%      Co-Pay: no visit limit DME: 80%     Co-Pay: 20% Providers: in network  SECONDARY: none      Medicaid Application Date:       Case Manager:  Disability Application Date:       Case Worker:   Emergency Contact Information Contact Information    Name Relation Home Work Mobile   [place] Spouse [phone]  [phone]   [facility] Daughter [phone]     Cabe, Lashley   [phone]     Current Medical History  Patient Admitting Diagnosis: left thalamic ICH  History of Present Illness: Jeffrey [name] is a 74 y.o. right handed male with history of hypertension, diabetes mellitus and peripheral neuropathy, coronary artery disease with balloon angioplasty. Independent prior to admission living with his wife.   Presented 05/30/2014 with right sided weakness and slurred speech. Cranial CT scan showed acute left thalamic and left internal capsule parenchymal hemorrhage compatible with hypertensive hemorrhage. MRI again notes left thalamic hemorrhage showing some increased intraventricular and bilateral subarachnoid hemorrhage since recent cranial CT. MRA  of the head was negative. Carotid Dopplers with no ICA stenosis. Echocardiogram with ejection fraction of 54% grade 1 diastolic dysfunction. Neurology follow-up with conservative care. Tolerating a regular consistency diet. Bouts of loose stool with C. difficile specimen pending.   Total: 12 NIH    Past Medical History  Past Medical History  Diagnosis Date  . CAD (coronary artery disease)     PCI distal RCA ...2004, residual 70% LAD   /   ...nuclear...03/2007...no ischemia.Marland KitchenMarland Kitchenpreserved LV /  nuclear...03/03/2009...inferior scar..no ischemia..EF 51%  . Dyslipidemia     takes Atorvastatin daily  . Internal hemorrhoids   . Gout     takes Allopurinol daily and Colchicine as needed  . Seasonal allergic rhinitis   . Right carotid bruit   . Cyst of nasopharynx     per ENT Wilburn Cornelia  . Myocardial infarction 72yrs ago  . Peripheral edema     takes Lasix daily  . HTN (hypertension)     takes Cardura,Metoprolol,[name],and Amlodipine daily  . Arthritis   . GERD (gastroesophageal reflux disease)     takes Omeprazole daily  . History of colon polyps   . Type 2 diabetes, uncontrolled, with neuropathy 1989  takes Invokana daily and has an insulin pump (Dr. [name])  . 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 There is no history of Coronary artery disease, Stroke, Cancer, Diabetes, or Colon cancer.  Prior Rehab/Hospitalizations: none   Current Medications   Current facility-administered medications:  .  acetaminophen (TYLENOL) tablet 650 mg, 650 mg, Oral, Q4H PRN, 650 mg at 06/02/14 1737 **OR** acetaminophen (TYLENOL) suppository 650 mg, 650 mg, Rectal, Q4H PRN, Wallie Char .  amLODipine (NORVASC) tablet 10 mg, 10 mg, Oral, Daily, Melvenia Beam, MD, 10 mg at 06/03/14 1044 .  antiseptic oral rinse (CPC / CETYLPYRIDINIUM CHLORIDE 0.05%) solution 7 mL, 7 mL, Mouth Rinse,  q12n4p, Garvin Fila, MD, 7 mL at 06/02/14 1709 .  atorvastatin (LIPITOR) tablet 40 mg, 40 mg, Oral, q1800, Melvenia Beam, MD, 40 mg at 06/02/14 1709 .  chlorhexidine (PERIDEX) 0.12 % solution 15 mL, 15 mL, Mouth Rinse, BID, Garvin Fila, MD, 15 mL at 06/03/14 0824 .  insulin aspart (novoLOG) injection 0-9 Units, 0-9 Units, Subcutaneous, TID WC, David L Rinehuls, PA-C, 2 Units at 06/03/14 1202 .  labetalol (NORMODYNE,TRANDATE) injection 10-40 mg, 10-40 mg, Intravenous, Q10 min PRN, Wallie Char .  lisinopril (PRINIVIL,ZESTRIL) tablet 10 mg, 10 mg, Oral, Daily, Rosalin Hawking, MD, 10 mg at 06/03/14 1044 .  pantoprazole (PROTONIX) EC tablet 40 mg, 40 mg, Oral, Daily, Norva Riffle, RPH, 40 mg at 06/03/14 1044 .  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 Very private man and does not want easy access for church members to visit. Wants to limit to his immediate family only.  Home Assistive Devices / Equipment Home Assistive Devices/Equipment: None  Prior Functional Level Prior Function Level of Independence: Independent Comments: Retired Scientist, research (medical)  Arousal/Alertness: Awake/alert Overall Cognitive Status: Impaired/Different from baseline Orientation Level: Oriented to person, Oriented to place, Oriented to situation, Disoriented to time Attention: Sustained Sustained Attention: Appears intact Memory: Impaired Memory Impairment: Storage deficit, Retrieval deficit, Decreased recall of new  information Awareness: Impaired Awareness Impairment: Emergent impairment Problem Solving: Impaired Problem Solving Impairment: Verbal complex, Functional basic, Functional complex Executive Function: Self Monitoring, Self Correcting Self Monitoring: Impaired Self Monitoring Impairment: Verbal complex, Functional complex Self Correcting: Impaired Self Correcting Impairment: Verbal complex, Functional complex Safety/Judgment: Impaired Comments: required cues to recall and return demonstration of call bell for assist    Extremity Assessment (includes Sensation/Coordination)  Upper Extremity Assessment: RUE deficits/detail RUE Deficits / Details: 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  He has poor proprioceptive awareness of Rt UE and reports it often moves around without his awareness.  He has to rely on visual imput to move or use Rt UE.  He is unable to grade force of movement.  He demonstrates gross grasp and release and AAROM WFL.  full hand to mouth actively  RUE Sensation: decreased light touch, decreased proprioception RUE Coordination: decreased fine motor, decreased gross motor  Lower Extremity Assessment: Defer to PT evaluation RLE Deficits / Details: Weak,  flexion/adduction stronger vs extension/abduction, R ankle PF and inversion dominant tone. RLE Coordination: decreased fine motor    ADLs  Overall ADL's : Needs assistance/impaired Eating/Feeding: Modified independent, Bed level Grooming: Wash/dry hands, Wash/dry face, Oral care, Brushing hair, Supervision/safety, Bed level (min A seated ) Upper Body Bathing: Minimal assitance, Sitting Lower Body Bathing: Moderate assistance, Bed level Upper Body Dressing : Moderate assistance, Sitting Lower Body Dressing: Bed level, Total assistance Toilet Transfer: Total assistance Toilet Transfer Details (indicate cue type and reason): unable to attempt due to dizziness  Toileting- Clothing Manipulation and Hygiene: Maximal assistance,  Bed level Functional mobility during ADLs: Moderate assistance (to EOB ) General ADL Comments: Pt unable to tolerate sit to stand due to dizziness.  Focused on seated tasks in attempts to acclimated to upright postures.  Pt instructed in gaze stabilization, but reports the objects roll     Mobility  Overal bed mobility: Needs Assistance Bed Mobility: Rolling, Sidelying to Sit, Sit to Sidelying Rolling: Mod assist Sidelying to sit: Mod assist Supine to sit: Max assist Sit to sidelying: Mod assist General bed mobility comments: Pt with complaint of back pain with bed mobility.  Instructed him in log rolling technique to reduce back pain.  He requires mod A  to incorporate Rt side as he has poor awareness of movement on Rt.     Transfers  Overall transfer level: Needs assistance Equipment used: 1 person hand held assist Transfers: Sit to/from Stand, Stand Pivot Transfers Sit to Stand: Mod assist, Max assist Stand pivot transfers: +2 physical assistance (not tested on evaluation) General transfer comment: Did not attempt due to dizziness     Ambulation / Gait / Stairs / Wheelchair Mobility       Posture / Balance Dynamic Sitting Balance Sitting balance - Comments: Pt able to sit witohut assist, but ataxia and sway noted. He did not lose balance with no UE support, but unable to accept challenges or movement off his BOS Balance Overall balance assessment: Needs assistance Sitting-balance support: Feet supported Sitting balance-Leahy Scale: Fair Sitting balance - Comments: Pt able to sit witohut assist, but ataxia and sway noted. He did not lose balance with no UE support, but unable to accept challenges or movement off his BOS Postural control: Left lateral lean Standing balance-Leahy Scale: Zero    Special needs/care consideration Bowel mgmt: last BM 3/29 watery continent Bladder mgmt: condom catheter Diabetic mgmt insulin pump pta. Is not using pump inhospital    Previous Home  Environment Living Arrangements: Spouse/significant other  Lives With: Spouse Available Help at Discharge: Family, Available 24 hours/day Type of Home: House Home Layout: One level Home Access: Stairs to enter Entrance Stairs-Rails: Right, Left, Can reach both Entrance Stairs-Number of Steps: 3 Bathroom Shower/Tub: Chiropodist: Standard Bathroom Accessibility: Yes How Accessible: Accessible via walker Home Care Services: No Additional Comments: wife with recent rotator cuff repair curently at [facility]. Wife with hisotry of frequent falls and injured right shoulder She had a  repair 3/14? And is at [facility]. She does her therapy there in the morning and then comes to stay with pt in hospital. She is in a wheelchair but is ambulatory. Church members drive her here daily and back.  Discharge Living Setting Plans for Discharge Living Setting: Patient's home, Lives with (comment), Other (Comment) (spouse) Type of Home at Discharge: House Discharge Home Layout: One level Discharge Home Access: Stairs to enter Entrance Stairs-Rails: Right, Left, Can reach both Entrance Stairs-Number of  Steps: 3 Discharge Bathroom Shower/Tub: Tub/shower unit, Curtain Discharge Bathroom Toilet: Standard Discharge Bathroom Accessibility: Yes How Accessible: Other (comment), Accessible via walker (has bars and rails in bathroom) Does the patient have any problems obtaining your medications?: No  Social/Family/Support Systems Patient Roles: Spouse, Parent, Other (Comment) (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 high fall risk with frequ) Discharge Plan Discussed with Primary Caregiver: Yes Is Caregiver In Agreement with Plan?: Yes Does Caregiver/Family have Issues with Lodging/Transportation while Pt is in Rehab?: No Wife expects to be at [facility] for just 3 weeks. I have discussed with pt, his wife, daughter [name], and son, Jenny Reichmann that she can only provide 24/7 supervision once he and she is at home. Feel pt will need some min assist with adls and that family need to discuss how would this be provided. Daughter graduates from [employer] in May as  Med tech. Son lives in [place] but is visiting often. Son will be back this weekend of 06/06/14. I have asked son to check to see if Dad has longterm care insurance or if people can be arranged or hired to assist once pt ready to d/c home.   Goals/Additional Needs Patient/Family Goal for Rehab: supervision to min assist with PT, OT, and SLP Expected length of stay: [facility] 15- 18 days Pt/Family Agrees to Admission and willing to participate: Yes Program Orientation Provided & Reviewed with Pt/Caregiver Including Roles  & Responsibilities: Yes   Decrease burden of Care through IP rehab admission: n/a  Possible need for SNF placement upon discharge:I have discussed with pt , wife and children that if additional assistance is needed beyond supervision, that either hired assistance, church members or family would have to be arranged. Otherwise short term SNF would have to be pursued.  Patient Condition: This patient's condition remains as documented in the consult dated 06/02/2014, in which the Rehabilitation Physician determined and documented that the patient's condition is appropriate for intensive rehabilitative care in an inpatient rehabilitation facility. Will admit to inpatient rehab today.  Preadmission Screen Completed By:  Cleatrice Burke, 06/03/2014 12:46 PM ______________________________________________________________________    Discussed status with Dr. Naaman Plummer on 06/03/14 at  1246 and received telephone approval for admission today.  Admission Coordinator:  Cleatrice Burke, time 9030 Date 06/03/2014.

## 2014-06-03 NOTE — Progress Notes (Signed)
Pt. Is being transferred to room 4M10, IV's removed, pt. Alert and oriented and verbalized understanding of the need to be moved to rehab. Pt. Belongings packed and sent with pt. Report called to [place]

## 2014-06-03 NOTE — Progress Notes (Signed)
Patient arrived to unit at 1500 from [place]. Oriented patient to unit and environment. Verbalized understanding of safety plan and signed the safety plan agreement posted in the room. Patient watched the safety video on the [facility] network. Pt wife at the bedside at the time of admission and verbalized understanding. Cont plan of care. Rokia Bosket, Dione Plover

## 2014-06-03 NOTE — Progress Notes (Signed)
Physical Therapy Treatment Patient Details Name: Jeffrey [name] MRN: [id number] DOB: Sep 09, 1940 Today's Date: 06/03/2014    History of Present Illness 74 yo male with hx of HTN, HLD, CAD, and DM, found collapsed in carport with R weakness.  Dx of L thalamic and internal capsular hemorrhage with intraventricular extension.    PT Comments    Pt is progressing with his mobility and was able, with two person assist to transfer OOB to the recliner chair today.  Double vision is improving and he was also able to tolerate some gaze stability training (see below).  He is very highly motivated and, even fatigued, will do everything asked of him if it will make him better.  He is excited to get approval for CIR level therapies and the plan is for him to d/c there this PM.  PT will continue to follow acutely.   Follow Up Recommendations  CIR     Equipment Recommendations  Wheelchair (measurements PT);Wheelchair cushion (measurements PT);3in1 (PT)    Recommendations for Other Services   NA     Precautions / Restrictions Precautions Precautions: Fall Precaution Comments: due to right sided weakness Restrictions Weight Bearing Restrictions: No    Mobility  Bed Mobility Overal bed mobility: Needs Assistance Bed Mobility: Rolling;Sidelying to Sit Rolling: Mod assist Sidelying to sit: Mod assist       General bed mobility comments: Mod assist to position right leg and secure right arm for roll to the left side.  Pt using left arm and bed rail for leverage.  Assis from side lying needed to get right leg over EOB and support trunk during transition to sitting.  Pt able to push up on left arm and elbow.   Transfers Overall transfer level: Needs assistance Equipment used: None Transfers: Sit to/from [facility] Sit to Stand: +2 safety/equipment;Mod assist;Max assist Stand pivot transfers: +2 physical assistance;Max assist       General transfer comment: Two person mod  (for static) to max (for dynamic) assist in standing EOB with right knee blocked.  Pre gait attempted stepping forward and backward with both feet with right knee blocked.  Pt with tendancy to lean to the right, but can correct with verbal and tactile cues to weight shift to the left.  He did better during our session keeping weight shifted forward and hips extended.  After transferring to reclier chair with two person max assist we did attempt to stand from low recliner chair with the steady standing frame successfully (still had to watch and secure his right leg, arm and prevent thrusting to the right in both sitting and standing on the steady, but likely safer for staff to use to get him back into the bed (RN made aware).     Modified Rankin (Stroke Patients Only) Modified Rankin (Stroke Patients Only) Pre-Morbid Rankin Score: No symptoms Modified Rankin: Severe disability     Balance Overall balance assessment: Needs assistance Sitting-balance support: Feet supported;Single extremity supported Sitting balance-Leahy Scale: Fair Sitting balance - Comments: can be supervision EOB with left arm supported on bed or bed rail.  In sitting wtih visual input, pt was able to kick and lift (hip flexion) his right leg.  In standing, without visual input, he was unable to activate those muscles as well to attempt to take a step forward and backward.   Postural control: Right lateral lean;Posterior lean Standing balance support: Single extremity supported Standing balance-Leahy Scale: Zero Standing balance comment: Mod to max two person assist  in standing.  Pt thrusting to the right and posteriorly without cues.                      Cognition Arousal/Alertness: Awake/alert Behavior During Therapy: WFL for tasks assessed/performed Overall Cognitive Status: Impaired/Different from baseline Area of Impairment: Following commands;Problem solving       Following Commands: Follows one step  commands with increased time     Problem Solving: Difficulty sequencing;Requires verbal cues;Requires tactile cues         General Comments General comments (skin integrity, edema, etc.): Worked after transfer in sitting in recliner chair on visualization of stationary target "A".  Pt reports today with binocular vision that the A is not double, "but trying to be" and is "fuzzy".  With only monocular vision bil the focus of the A was much better.  We attempted with both monocular and binocular vision starting slow VOR x1 vertical and horizontal exercises.  Dizziness max during entire session 4/10.        Pertinent Vitals/Pain Pain Assessment: Faces Faces Pain Scale: Hurts a little bit Pain Location: low back pain.  Pain Descriptors / Indicators: Grimacing Pain Intervention(s): Limited activity within patient's tolerance;Monitored during session;Repositioned    Home Living                 Additional Comments: wife with recent rotator cuff repair curently at [facility] SN[facility]        PT Goals (current goals can now be found in the care plan section) Acute Rehab PT Goals Patient Stated Goal: to get better  Progress towards PT goals: Progressing toward goals    Frequency  Min 4X/week    PT Plan Current plan remains appropriate       End of Session Equipment Utilized During Treatment: Gait belt Activity Tolerance: Patient limited by fatigue Patient left: in chair;with call bell/phone within reach;with chair alarm set;with family/visitor present     Time: 1355-1428 PT Time Calculation (min) (ACUTE ONLY): 33 min  Charges:  $Therapeutic Activity: 8-22 mins $Neuromuscular Re-education: 8-22 mins                      Mithcell Schumpert B. [place], Hansen, DPT [phone]   06/03/2014, 2:53 PM

## 2014-06-03 NOTE — Discharge Summary (Signed)
Stroke Discharge Summary  Patient ID: Jeffrey [name]   MRN: [id number]      DOB: [birth year]  Date of Admission: 05/30/2014 Date of Discharge: 06/03/2014  Attending Physician:  Rosalin Hawking, MD, Stroke MD  Consulting Physician(s):    Alger Simons, MD (Physical Medicine & Rehabtilitation)  Patient's PCP:  [name], MD  Discharge Diagnoses:  Principal Problem:   Hemorrhagic stroke: Dominant LEFT thalamic and LEFT internal capsule ischemic infarct with resultant hemorrhagic transformation, secondary to small vessel disease Active Problems:   Essential hypertension   Hyperlipidemia   Type 2 diabetes, uncontrolled, with neuropathy   CAD (coronary artery disease)   Tobacco abuse   Obesity Obesity, BMI  Body mass index is 30.14 kg/(m^2).   Past Medical History  Diagnosis Date  . CAD (coronary artery disease)     PCI distal RCA ...2004, residual 70% LAD   /   ...nuclear...03/2007...no ischemia.Marland KitchenMarland Kitchenpreserved LV /  nuclear...03/03/2009...inferior scar..no ischemia..EF 51%  . Dyslipidemia     takes Atorvastatin daily  . Internal hemorrhoids   . Gout     takes Allopurinol daily and Colchicine as needed  . Seasonal allergic rhinitis   . Right carotid bruit   . Cyst of nasopharynx     per ENT Wilburn Cornelia  . Myocardial infarction 52yrs ago  . Peripheral edema     takes Lasix daily  . HTN (hypertension)     takes Cardura,Metoprolol,[name],and Amlodipine daily  . Arthritis   . GERD (gastroesophageal reflux disease)     takes Omeprazole daily  . History of colon polyps   . Type 2 diabetes, uncontrolled, with neuropathy 1989    takes Invokana daily and has an insulin pump (Dr. [name])  . 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 Cataract extraction Bilateral 2010  . Elbow surgery Right 1997  . Balloon angioplasty, artery  1992, 2004    CAD, Dr. Ron Parker  . Colonoscopy  04/03/2002     adenomatous polyp, int hemorrhoids  . Colonoscopy  07/18/2007    normal (Dr. Fuller Plan)  . Rotator cuff repair  09/2011    left, with subacromial decompression  . Nasal septum surgery    . Colonoscopy  09/26/2012    tubular adenoma, sm int hem, rpt 5 yrs Fuller Plan)  . Tonsillectomy and adenoidectomy    . Eye lids raised    . Cardiac catheterization  2004  . Polypectomy N/A 05/27/2013    Procedure: ENDOSCOPIC NASOPHARYNGEAL MASS;  Surgeon: [name], MD    Medications to be continued on Rehab . amLODipine  10 mg Oral Daily  . antiseptic oral rinse  7 mL Mouth Rinse q12n4p  . atorvastatin  40 mg Oral q1800  . chlorhexidine  15 mL Mouth Rinse BID  . insulin aspart  0-9 Units Subcutaneous TID WC  . lisinopril  10 mg Oral Daily  . pantoprazole  40 mg Oral Daily  . senna-docusate  1 tablet Oral BID    LABORATORY STUDIES CBC    Component Value Date/Time   WBC 10.1 05/31/2014 1515   RBC 4.83 05/31/2014 1515   HGB 14.8 05/31/2014 1515   HCT 44.7 05/31/2014 1515   PLT 143* 05/31/2014 1515   MCV 92.5 05/31/2014 1515   MCH 30.6 05/31/2014 1515   MCHC 33.1 05/31/2014 1515   RDW 14.4 05/31/2014 1515   LYMPHSABS 1.6 05/31/2014 1515   MONOABS 0.8  05/31/2014 1515   EOSABS 0.0 05/31/2014 1515   BASOSABS 0.0 05/31/2014 1515   CMP    Component Value Date/Time   NA 146* 05/31/2014 1515   NA 138 02/01/2011   NA 138 02/01/2011   K 3.5 05/31/2014 1515   K 3.6 02/01/2011   K 3.6 02/01/2011   CL 115* 05/31/2014 1515   CL 102 04/19/2010   CO2 21 05/31/2014 1515   CO2 28 04/19/2010   GLUCOSE 129* 05/31/2014 1515   BUN 23 05/31/2014 1515   BUN 24* 02/01/2011   BUN 24* 02/01/2011   CREATININE 1.02 05/31/2014 1515   CREATININE 1.24 09/30/2013   CREATININE 1.24 09/30/2013   CREATININE 1.2 09/30/2013   CALCIUM 9.2 05/31/2014 1515   CALCIUM 9.7 02/01/2011   CALCIUM 9.7 02/01/2011   PROT 6.8 05/30/2014 0714   PROT 6.8 04/19/2010   ALBUMIN 3.8 05/30/2014 0714   AST 74* 05/30/2014  0714   AST 24 09/30/2013   AST 24 09/30/2013   ALT 35 05/30/2014 0714   ALT 34 09/30/2013   ALT 34 09/30/2013   ALKPHOS 95 05/30/2014 0714   ALKPHOS 132 09/30/2013   ALKPHOS 132 09/30/2013   BILITOT 1.0 05/30/2014 0714   BILITOT 1.0 09/30/2013   BILITOT 1.0 09/30/2013   GFRNONAA 72* 05/31/2014 1515   GFRAA 83* 05/31/2014 1515   COAGS Lab Results  Component Value Date   INR 1.16 05/30/2014   Lipid Panel    Component Value Date/Time   CHOL 168 06/02/2014 0810   CHOL 211 02/01/2011   TRIG 316* 06/02/2014 0810   TRIG 342 09/30/2013   TRIG 342 09/30/2013   TRIG 522 02/01/2011   TRIG 522 02/01/2011   HDL 29* 06/02/2014 0810   CHOLHDL 5.8 06/02/2014 0810   VLDL 63* 06/02/2014 0810   LDLCALC 76 06/02/2014 0810   LDLCALC 98 09/30/2013   LDLCALC 98 09/30/2013   HgbA1C  Lab Results  Component Value Date   HGBA1C 7.8* 04/29/2014   Urinalysis    Component Value Date/Time   COLORURINE YELLOW 05/30/2014 1904   APPEARANCEUR CLEAR 05/30/2014 1904   LABSPEC 1.020 05/30/2014 1904   PHURINE 5.5 05/30/2014 1904   GLUCOSEU >1000* 05/30/2014 1904   HGBUR LARGE* 05/30/2014 1904   BILIRUBINUR NEGATIVE 05/30/2014 1904   KETONESUR 40* 05/30/2014 1904   PROTEINUR NEGATIVE 05/30/2014 1904   UROBILINOGEN 0.2 05/30/2014 1904   NITRITE NEGATIVE 05/30/2014 1904   LEUKOCYTESUR NEGATIVE 05/30/2014 1904   Urine Drug Screen     Component Value Date/Time   LABOPIA NONE DETECTED 05/30/2014 1904   COCAINSCRNUR NONE DETECTED 05/30/2014 1904   LABBENZ NONE DETECTED 05/30/2014 1904   AMPHETMU NONE DETECTED 05/30/2014 1904   THCU NONE DETECTED 05/30/2014 1904   LABBARB NONE DETECTED 05/30/2014 1904    Alcohol Level    Component Value Date/Time   ETH <5 05/30/2014 0714     SIGNIFICANT DIAGNOSTIC STUDIES Ct Head (brain) Wo Contrast 05/30/2014  1. Acute LEFT thalamic and LEFT internal capsule parenchymal hemorrhage compatible with hypertensive hemorrhage. Intraventricular break  through, with blood in both lateral ventricles, third and fourth ventricles.  2. No hydrocephalus.   MRI / MRA Head Wo Contrast 05/31/2014 1. Left thalamic hemorrhage probably represents hemorrhagic transformation of a left thalamic and posterior corona radiata infarct. Intra-axial hemorrhage, edema, and mild mass effect have not significantly changed. 2. Increased intraventricular and bilateral subarachnoid hemorrhage since yesterday. No ventriculomegaly. 3.Negative intracranial MRA. 4. Underlying mild for age nonspecific signal changes compatible with chronic  small vessel disease.  Ct Cervical Spine Wo Contrast 05/30/2014  1. No acute fracture or traumatic subluxation.  2. Multilevel spondylosis of the cervical spine.  3. Anterior and posterior fusion at C2-3 and C3-4 is likely acquired.  4. Osseous foramina are scratch the osseous foraminal narrowing is most pronounced on the left at C5-6 and C6-7.  5. Bilateral sub cm thyroid nodules. Sub-centimeter thyroid nodule(s) noted, too small to characterize, but most likely benign in the absence of known clinical risk factors for thyroid carcinoma.   Chest Port 1 View 05/30/2014  No active disease.   Carotid Doppler  There is 1-39% bilateral ICA stenosis. Vertebral artery flow is antegrade.    2D echo  - Left ventricle: The cavity size was normal. Wall thickness was normal. Systolic function was normal. The estimated ejectionfraction was in the range of 60% to 65%. Wall motion was normal;there were no regional wall motion abnormalities. Dopplerparameters are consistent with abnormal left ventricularrelaxation (grade 1 diastolic dysfunction). The E/e&' ratio isbetween 8-15, suggesting indeterminate LV filling pressure. - Left atrium: The atrium was normal in size. Impressions: - Compared to the prior echo in 2012, there has been no signficantchange.     HISTORY OF PRESENT ILLNES Jeffrey [name] is an 74 y.o. male with  a history of hypertension, hyperlipidemia, coronary artery disease and diabetes mellitus, brought to the emergency room 05/30/2014 after being found lying in his carport at home. He was noted to have right-sided weakness and speech. He also appeared to be somewhat confused. Code stroke was activated. It's not clear exactly when he was last known well, however. Code stroke was subsequently canceled. NIH stroke score was 11. CT scan of his head showed an acute left thalamic and internal capsular ridge with intraventricular extension. He was not a PTA candidate given ICH, IVH. He was admitted to the neuro ICU for further evaluation and treatment.    HOSPITAL COURSE  Stroke: Dominant LEFT thalamic and LEFT internal capsule ischemic infarct with resultant hemorrhagic transformation. Infarct secondary to small vessel disease  Resultant Right hemiparesis, dysarthria, diplopia  MRI - right BG hemorrhagic infarct.  MRA - Negative   Carotid Doppler No significant stenosis   2D Echo - EF 60-65%. No cardiac source of emboli identified.  no antithrombotic prior to admission, now on no antithrombotic given hemorrhagic transformation  Ongoing aggressive stroke risk factor management  Therapy recommendations: CIR.   Disposition: CIR. Admissions coordinator to follow  Recommend repeat CT in one month. If blood resolved, may resume aspirin 81 mg daily for secondary stroke prevention. (future CT ordered)  Hypertension  Home meds: no antihtn. meds PTA.  norvasc 10 mg daily and lisinopril 10mg  were added  Long-term goal < 130/90  Monitor BPs  Hyperlipidemia  Home meds: No lipid lowering meds PTA  LDL 76, goal < 70  New lipitor 40 added  Continue statin at discharge  Diabetes  HgbA1c 7.8 in February 2016, goal < 7.0  Uncontrolled  At home with insulin pump  SSI in hospital  Tobacco abuse  Current smoker  Smoking cessation counseling provided  Pt is willing to  quit  Other Stroke Risk Factors  Advanced age  Obesity, Body mass index is 30.14 kg/(m^2).   Coronary artery disease  Other Active Problems  Placed on enteric precautions due to loose stools. C Diff negative. Precautions removed.   DISCHARGE EXAM Blood pressure 144/92, pulse 81, temperature 98.8 F (37.1 C), temperature source Oral, resp. rate 20, weight 89.9 kg (  198 lb 3.1 oz), SpO2 95 %. Gen: NAD Eyes: anicteric sclerae, moist conjunctivae  CV: no MRG, no carotid bruits, no peripheral edema Mental Status: Alert, Oriented to person, place, month, year. Follows commands. Intact remote and recent memory, intact fund of knowledge, concentration and attention. Lungs clear to auscultation. No wheezing, rales. Neuro:  Detailed Neurologic Exam Speech:  No aphasia, +dysarthria Cranial Nerves:  The pupils are equal, round, and reactive to light. Fundi flat. EOMI. Conjugate gaze. Visual fields full. Facial mildly deceased light touch. Mild right facial weakness. Tongue midline. Hearing intact. Shoulder shrug intact. Motor Observation:  no involuntary movements noted. Tone appears normal.  Strength:  Right hemiparesis. Improved from admission. RUE 0/5 proximal but 4/5 bicep, 2/5 tricep and 4/5 hand grip. RLE 0/5 proximal, 3/5 knee flexion, 4/5 toe dorsiflexion. Sensation:  Decreased on the right UE and LE. Plantars upgoing right, equivocal left.   Discharge Diet  Diet heart healthy/carb modified, Thin  liquids  DISCHARGE PLAN  Disposition:  Transfer to [facility] for ongoing PT, OT and ST  Repeat CT in 2 weeks, resume low dose aspirin if hemorrhage resolved. Call stroke team for any questions.  Recommend ongoing risk factor control by Primary Care Physician at time of discharge from inpatient rehabilitation.  Follow-up [name], MD in 2 weeks following discharge from rehab.  Follow-up with Dr. Rosalin Hawking, Stroke Clinic in 2 months.    45 minutes were spent preparing discharge.  [place] for Pager information 06/03/2014 4:20 PM   I, the attending vascular neurologist, have personally obtained a history, examined the patient, evaluated laboratory data, individually viewed imaging studies and agree with radiology interpretations. Together with the NP/PA, we formulated the assessment and plan of care which reflects our mutual decision.  I have made any additions or clarifications directly to the above note and agree with the findings and plan as currently documented.    Rosalin Hawking, MD PhD Stroke Neurology 06/04/2014 12:10 AM

## 2014-06-03 NOTE — Progress Notes (Signed)
Jeffrey Gong, RN Rehab Admission Coordinator Signed Physical Medicine and Rehabilitation PMR Pre-admission 06/03/2014 12:21 PM  Related encounter: ED to Hosp-Admission (Discharged) from 05/30/2014 in [place]   PMR Admission Coordinator Pre-Admission Assessment  Patient: Jeffrey [name] is an 74 y.o., male MRN: [id number] DOB: 1940/03/14 Height:   Weight: 89.9 kg (198 lb 3.1 oz)  Insurance Information HMO: yes PPO: PCP: IPA: 80/20: OTHER: medicare advantage plan PRIMARY: Blue Medicare Policy#: [id number] Subscriber: pt CM Name: Jeffrey [name] Phone#: [phone] Fax#: [phone] Pre-Cert#: [id number] Employer: retired. update in 7 days Benefits: Phone #: [phone] Name: 06/03/14 Eff. Date: 03/07/14 Deduct: none Out of Pocket Max: $3950 Life Max: none CIR: $275 per day days 1-6 then covers 100% SNF: no copay days 1-20; $75 per day days 21-49; $100 copay per day days 50-100 Outpatient: $40 copay per visit Co-Pay: no visit limit Home Health: 100% Co-Pay: no visit limit DME: 80% Co-Pay: 20% Providers: in network  SECONDARY: none  Medicaid Application Date: Case Manager:  Disability Application Date: Case Worker:   Emergency Contact Information Contact Information    Name Relation Home Work Mobile   Jeffrey [name] Spouse [phone]  [phone]   [facility] Daughter [phone]     Jeffrey [name]   [phone]     Current Medical History  Patient Admitting Diagnosis: left thalamic ICH  History of Present Illness: Jeffrey [name] is a 74 y.o. right handed male with history of hypertension, diabetes mellitus and peripheral neuropathy, coronary  artery disease with balloon angioplasty. Independent prior to admission living with his wife.   Presented 05/30/2014 with right sided weakness and slurred speech. Cranial CT scan showed acute left thalamic and left internal capsule parenchymal hemorrhage compatible with hypertensive hemorrhage. MRI again notes left thalamic hemorrhage showing some increased intraventricular and bilateral subarachnoid hemorrhage since recent cranial CT. MRA of the head was negative. Carotid Dopplers with no ICA stenosis. Echocardiogram with ejection fraction of 26% grade 1 diastolic dysfunction. Neurology follow-up with conservative care. Tolerating a regular consistency diet. Bouts of loose stool with C. difficile specimen pending.  Total: 12 NIH    Past Medical History  Past Medical History  Diagnosis Date  . CAD (coronary artery disease)     PCI distal RCA ...2004, residual 70% LAD / ...nuclear...03/2007...no ischemia.Marland KitchenMarland Kitchenpreserved LV / nuclear...03/03/2009...inferior scar..no ischemia..EF 51%  . Dyslipidemia     takes Atorvastatin daily  . Internal hemorrhoids   . Gout     takes Allopurinol daily and Colchicine as needed  . Seasonal allergic rhinitis   . Right carotid bruit   . Cyst of nasopharynx     per ENT Wilburn Cornelia  . Myocardial infarction 53yrs ago  . Peripheral edema     takes Lasix daily  . HTN (hypertension)     takes Cardura,Metoprolol,[name],and Amlodipine daily  . Arthritis   . GERD (gastroesophageal reflux disease)     takes Omeprazole daily  . History of colon polyps   . Type 2 diabetes, uncontrolled, with neuropathy 1989    takes Invokana daily and has an insulin pump (Dr. [name])  . 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 There is no history of Coronary artery disease, Stroke, Cancer,  Diabetes, or Colon cancer.  Prior Rehab/Hospitalizations: none  Current Medications   Current facility-administered medications:  . acetaminophen (TYLENOL) tablet 650 mg,  650 mg, Oral, Q4H PRN, 650 mg at 06/02/14 1737 **OR** acetaminophen (TYLENOL) suppository 650 mg, 650 mg, Rectal, Q4H PRN, Wallie Char . amLODipine (NORVASC) tablet 10 mg, 10 mg, Oral, Daily, Melvenia Beam, MD, 10 mg at 06/03/14 1044 . antiseptic oral rinse (CPC / CETYLPYRIDINIUM CHLORIDE 0.05%) solution 7 mL, 7 mL, Mouth Rinse, q12n4p, Garvin Fila, MD, 7 mL at 06/02/14 1709 . atorvastatin (LIPITOR) tablet 40 mg, 40 mg, Oral, q1800, Melvenia Beam, MD, 40 mg at 06/02/14 1709 . chlorhexidine (PERIDEX) 0.12 % solution 15 mL, 15 mL, Mouth Rinse, BID, Garvin Fila, MD, 15 mL at 06/03/14 0824 . insulin aspart (novoLOG) injection 0-9 Units, 0-9 Units, Subcutaneous, TID WC, David L Rinehuls, PA-C, 2 Units at 06/03/14 1202 . labetalol (NORMODYNE,TRANDATE) injection 10-40 mg, 10-40 mg, Intravenous, Q10 min PRN, Wallie Char . lisinopril (PRINIVIL,ZESTRIL) tablet 10 mg, 10 mg, Oral, Daily, Rosalin Hawking, MD, 10 mg at 06/03/14 1044 . pantoprazole (PROTONIX) EC tablet 40 mg, 40 mg, Oral, Daily, Norva Riffle, RPH, 40 mg at 06/03/14 1044 . 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 Very private man and does not want easy access for church members to visit. Wants to limit to his immediate family only.  Home Assistive Devices /  Equipment Home Assistive Devices/Equipment: None  Prior Functional Level Prior Function Level of Independence: Independent Comments: Retired Scientist, research (medical)  Arousal/Alertness: Awake/alert Overall Cognitive Status: Impaired/Different from baseline Orientation Level: Oriented to person, Oriented to place, Oriented to situation, Disoriented to time Attention: Sustained Sustained Attention: Appears intact Memory: Impaired Memory Impairment: Storage deficit, Retrieval deficit, Decreased recall of new information Awareness: Impaired Awareness Impairment: Emergent impairment Problem Solving: Impaired Problem Solving Impairment: Verbal complex, Functional basic, Functional complex Executive Function: Self Monitoring, Self Correcting Self Monitoring: Impaired Self Monitoring Impairment: Verbal complex, Functional complex Self Correcting: Impaired Self Correcting Impairment: Verbal complex, Functional complex Safety/Judgment: Impaired Comments: required cues to recall and return demonstration of call bell for assist   Extremity Assessment (includes Sensation/Coordination)  Upper Extremity Assessment: RUE deficits/detail RUE Deficits / Details: 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 He has poor proprioceptive awareness of Rt UE and reports it often moves around without his awareness. He has to rely on visual imput to move or use Rt UE. He is unable to grade force of movement. He demonstrates gross grasp and release and AAROM WFL. full hand to mouth actively  RUE Sensation: decreased light touch, decreased proprioception RUE Coordination: decreased fine motor, decreased gross motor  Lower Extremity Assessment: Defer to PT evaluation RLE Deficits / Details: Weak, flexion/adduction stronger vs extension/abduction, R ankle PF and inversion dominant tone. RLE Coordination: decreased fine motor    ADLs  Overall ADL's : Needs  assistance/impaired Eating/Feeding: Modified independent, Bed level Grooming: Wash/dry hands, Wash/dry face, Oral care, Brushing hair, Supervision/safety, Bed level (min A seated ) Upper Body Bathing: Minimal assitance, Sitting Lower Body Bathing: Moderate assistance, Bed level Upper Body Dressing : Moderate assistance, Sitting Lower Body Dressing: Bed level, Total assistance Toilet Transfer: Total assistance Toilet Transfer Details (indicate cue type and reason): unable to attempt due  to dizziness  Toileting- Clothing Manipulation and Hygiene: Maximal assistance, Bed level Functional mobility during ADLs: Moderate assistance (to EOB ) General ADL Comments: Pt unable to tolerate sit to stand due to dizziness. Focused on seated tasks in attempts to acclimated to upright postures. Pt instructed in gaze stabilization, but reports the objects roll     Mobility  Overal bed mobility: Needs Assistance Bed Mobility: Rolling, Sidelying to Sit, Sit to Sidelying Rolling: Mod assist Sidelying to sit: Mod assist Supine to sit: Max assist Sit to sidelying: Mod assist General bed mobility comments: Pt with complaint of back pain with bed mobility. Instructed him in log rolling technique to reduce back pain. He requires mod A to incorporate Rt side as he has poor awareness of movement on Rt.     Transfers  Overall transfer level: Needs assistance Equipment used: 1 person hand held assist Transfers: Sit to/from Stand, Stand Pivot Transfers Sit to Stand: Mod assist, Max assist Stand pivot transfers: +2 physical assistance (not tested on evaluation) General transfer comment: Did not attempt due to dizziness     Ambulation / Gait / Stairs / Wheelchair Mobility       Posture / Balance Dynamic Sitting Balance Sitting balance - Comments: Pt able to sit witohut assist, but ataxia and sway noted. He did not lose balance with no UE support, but unable to accept challenges or movement off  his BOS Balance Overall balance assessment: Needs assistance Sitting-balance support: Feet supported Sitting balance-Leahy Scale: Fair Sitting balance - Comments: Pt able to sit witohut assist, but ataxia and sway noted. He did not lose balance with no UE support, but unable to accept challenges or movement off his BOS Postural control: Left lateral lean Standing balance-Leahy Scale: Zero    Special needs/care consideration Bowel mgmt: last BM 3/29 watery continent Bladder mgmt: condom catheter Diabetic mgmt insulin pump pta. Is not using pump inhospital    Previous Home Environment Living Arrangements: Spouse/significant other Lives With: Spouse Available Help at Discharge: Family, Available 24 hours/day Type of Home: House Home Layout: One level Home Access: Stairs to enter Entrance Stairs-Rails: Right, Left, Can reach both Entrance Stairs-Number of Steps: 3 Bathroom Shower/Tub: Chiropodist: Standard Bathroom Accessibility: Yes How Accessible: Accessible via walker Home Care Services: No Additional Comments: wife with recent rotator cuff repair curently at [facility]. Wife with hisotry of frequent falls and injured right shoulder She had a repair 3/14? And is at [facility]. She does her therapy there in the morning and then comes to stay with pt in hospital. She is in a wheelchair but is ambulatory. Church members drive her here daily and back.  Discharge Living Setting Plans for Discharge Living Setting: Patient's home, Lives with (comment), Other (Comment) (spouse) Type of Home at Discharge: House Discharge Home Layout: One level Discharge Home Access: Stairs to enter Entrance Stairs-Rails: Right, Left, Can reach both Entrance Stairs-Number of Steps: 3 Discharge Bathroom Shower/Tub: Tub/shower unit, Curtain Discharge Bathroom Toilet: Standard Discharge Bathroom Accessibility: Yes How Accessible: Other (comment), Accessible via walker (has  bars and rails in bathroom) Does the patient have any problems obtaining your medications?: No  Social/Family/Support Systems Patient Roles: Spouse, Parent, Other (Comment) (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 high fall risk with frequ) Discharge Plan Discussed with Primary Caregiver: Yes Is Caregiver In Agreement with Plan?: Yes Does Caregiver/Family have Issues with Lodging/Transportation while Pt is in Rehab?: No Wife expects to be at [facility] for just 3 weeks. I have discussed with pt, his wife, daughter [name], and son, Jenny Reichmann that she can only provide 24/7 supervision once he and she is at home. Feel pt will need some min assist with adls and that family need to discuss how would this be provided. Daughter graduates from [employer] in May as Med tech. Son lives in [place] but is visiting often. Son will be back this weekend of 06/06/14. I have asked son to check to see if Dad has longterm care insurance or if people can be arranged or hired to assist once pt ready to d/c home.   Goals/Additional Needs Patient/Family Goal for Rehab: supervision to min assist with PT, OT, and SLP Expected length of stay: ELOS 15- 18 days Pt/Family Agrees to Admission and willing to participate: Yes Program Orientation Provided & Reviewed with Pt/Caregiver Including Roles & Responsibilities: Yes   Decrease burden of Care through IP rehab admission: n/a  Possible need for SNF placement upon discharge:I have discussed with pt , wife and children that if additional assistance is needed beyond supervision, that either hired  assistance, church members or family would have to be arranged. Otherwise short term SNF would have to be pursued.  Patient Condition: This patient's condition remains as documented in the consult dated 06/02/2014, in which the Rehabilitation Physician determined and documented that the patient's condition is appropriate for intensive rehabilitative care in an inpatient rehabilitation facility. Will admit to inpatient rehab today.  Preadmission Screen Completed By: Cleatrice Burke, 06/03/2014 12:46 PM ______________________________________________________________________  Discussed status with Dr. Naaman Plummer on 06/03/14 at 1246 and received telephone approval for admission today.  Admission Coordinator: Cleatrice Burke, time 2751 Date 06/03/2014.          Cosigned by: Meredith Staggers, MD at 06/03/2014 1:01 PM  Revision History     Date/Time User Provider Type Action   06/03/2014 1:01 PM Meredith Staggers, MD Physician Cosign   06/03/2014 12:47 PM Jeffrey Gong, RN Rehab Admission Coordinator Sign

## 2014-06-03 NOTE — Progress Notes (Signed)
I met with pt and his wife at bedside. I have insurance approval and can admit pt to inpt rehab today. I spoke with Burnetta Sabin, [facility] and she is aware and in agreement. RN CM is also aware. I will make the arrangements to admit later today. [phone]

## 2014-06-04 ENCOUNTER — Inpatient Hospital Stay (HOSPITAL_COMMUNITY): Payer: Medicare Other | Admitting: Physical Therapy

## 2014-06-04 ENCOUNTER — Inpatient Hospital Stay (HOSPITAL_COMMUNITY): Payer: Medicare Other | Admitting: Occupational Therapy

## 2014-06-04 ENCOUNTER — Inpatient Hospital Stay (HOSPITAL_COMMUNITY): Payer: Medicare Other

## 2014-06-04 ENCOUNTER — Inpatient Hospital Stay (HOSPITAL_COMMUNITY): Payer: Medicare Other | Admitting: Speech Pathology

## 2014-06-04 DIAGNOSIS — I629 Nontraumatic intracranial hemorrhage, unspecified: Secondary | ICD-10-CM

## 2014-06-04 DIAGNOSIS — I10 Essential (primary) hypertension: Secondary | ICD-10-CM

## 2014-06-04 DIAGNOSIS — I619 Nontraumatic intracerebral hemorrhage, unspecified: Secondary | ICD-10-CM | POA: Insufficient documentation

## 2014-06-04 DIAGNOSIS — R918 Other nonspecific abnormal finding of lung field: Secondary | ICD-10-CM

## 2014-06-04 DIAGNOSIS — E1165 Type 2 diabetes mellitus with hyperglycemia: Secondary | ICD-10-CM

## 2014-06-04 LAB — COMPREHENSIVE METABOLIC PANEL
ALT: 32 U/L (ref 0–53)
ANION GAP: 12 (ref 5–15)
AST: 19 U/L (ref 0–37)
Albumin: 3.2 g/dL — ABNORMAL LOW (ref 3.5–5.2)
Alkaline Phosphatase: 96 U/L (ref 39–117)
BUN: 20 mg/dL (ref 6–23)
CALCIUM: 9.6 mg/dL (ref 8.4–10.5)
CO2: 19 mmol/L (ref 19–32)
Chloride: 108 mmol/L (ref 96–112)
Creatinine, Ser: 1.16 mg/dL (ref 0.50–1.35)
GFR, EST AFRICAN AMERICAN: 71 mL/min — AB (ref >90)
GFR, EST NON AFRICAN AMERICAN: 62 mL/min — AB (ref >90)
GLUCOSE: 183 mg/dL — AB (ref 70–99)
Potassium: 3.8 mmol/L (ref 3.5–5.1)
Sodium: 139 mmol/L (ref 135–145)
TOTAL PROTEIN: 5.9 g/dL — AB (ref 6.0–8.3)
Total Bilirubin: 2.1 mg/dL — ABNORMAL HIGH (ref 0.3–1.2)

## 2014-06-04 LAB — CBC WITH DIFFERENTIAL/PLATELET
BASOS PCT: 0 % (ref 0–1)
Basophils Absolute: 0 10*3/uL (ref 0.0–0.1)
Eosinophils Absolute: 0 10*3/uL (ref 0.0–0.7)
Eosinophils Relative: 0 % (ref 0–5)
HCT: 46 % (ref 39.0–52.0)
HEMOGLOBIN: 15.7 g/dL (ref 13.0–17.0)
Lymphocytes Relative: 6 % — ABNORMAL LOW (ref 12–46)
Lymphs Abs: 1 10*3/uL (ref 0.7–4.0)
MCH: 30.8 pg (ref 26.0–34.0)
MCHC: 34.1 g/dL (ref 30.0–36.0)
MCV: 90.2 fL (ref 78.0–100.0)
MONOS PCT: 8 % (ref 3–12)
Monocytes Absolute: 1.2 10*3/uL — ABNORMAL HIGH (ref 0.1–1.0)
Neutro Abs: 13.2 10*3/uL — ABNORMAL HIGH (ref 1.7–7.7)
Neutrophils Relative %: 86 % — ABNORMAL HIGH (ref 43–77)
PLATELETS: 120 10*3/uL — AB (ref 150–400)
RBC: 5.1 MIL/uL (ref 4.22–5.81)
RDW: 13.4 % (ref 11.5–15.5)
WBC: 15.3 10*3/uL — ABNORMAL HIGH (ref 4.0–10.5)

## 2014-06-04 LAB — GLUCOSE, CAPILLARY
GLUCOSE-CAPILLARY: 218 mg/dL — AB (ref 70–99)
GLUCOSE-CAPILLARY: 212 mg/dL — AB (ref 70–99)
Glucose-Capillary: 171 mg/dL — ABNORMAL HIGH (ref 70–99)
Glucose-Capillary: 286 mg/dL — ABNORMAL HIGH (ref 70–99)

## 2014-06-04 MED ORDER — FLEET ENEMA 7-19 GM/118ML RE ENEM: 1.0000 | ENEMA | Freq: Every day | RECTAL | Status: DC | PRN

## 2014-06-04 MED ORDER — POLYETHYLENE GLYCOL 3350 17 G PO PACK
17.0000 g | PACK | Freq: Once | ORAL | Status: AC
  Administered 2014-06-04: 17 g via ORAL
  Filled 2014-06-04: qty 1

## 2014-06-04 MED ORDER — ACETAMINOPHEN 325 MG PO TABS: 650.0000 mg | ORAL_TABLET | Freq: Four times a day (QID) | ORAL | Status: DC | PRN

## 2014-06-04 MED ORDER — FLEET ENEMA 7-19 GM/118ML RE ENEM
1.0000 | ENEMA | Freq: Once | RECTAL | Status: AC
  Administered 2014-06-04: 1 via RECTAL
  Filled 2014-06-04: qty 1

## 2014-06-04 MED ORDER — LEVOFLOXACIN 500 MG PO TABS
500.0000 mg | ORAL_TABLET | Freq: Every day | ORAL | Status: DC
  Administered 2014-06-04 – 2014-06-05 (×2): 500 mg via ORAL
  Filled 2014-06-04 (×3): qty 1

## 2014-06-04 MED ORDER — BISACODYL 10 MG RE SUPP: 10.0000 mg | Freq: Every day | RECTAL | Status: DC | PRN

## 2014-06-04 MED ORDER — CIPROFLOXACIN HCL 500 MG PO TABS
500.0000 mg | ORAL_TABLET | Freq: Two times a day (BID) | ORAL | Status: DC
  Administered 2014-06-04: 500 mg via ORAL
  Filled 2014-06-04 (×3): qty 1

## 2014-06-04 NOTE — Progress Notes (Signed)
Marlowe [place], PA notified of chest x-ray results. Ordered Cipro 500 mg bid and to monitor O2 during shift. Pt currently resting with eyes closed, in no signs of distress. Will continue to monitor. Kennieth Francois, RN

## 2014-06-04 NOTE — Progress Notes (Signed)
Physical Therapy Assessment and Plan  Patient Details  Name: Jeffrey [name] MRN: [id number] Date of Birth: 1940-11-11  PT Diagnosis: Abnormal posture, Abnormality of gait, Ataxia, Dizziness and giddiness, Hemiplegia dominant, Impaired cognition, Impaired sensation, Muscle weakness and Pain in rib cage Rehab Potential: Good ELOS: 21-28 days   Today's Date: 06/04/2014 PT Individual Time: 0830-0930 PT Individual Time Calculation (min): 60 min    Problem List:  Patient Active Problem List   Diagnosis Date Noted  . Intracerebral hemorrhage 06/04/2014  . Tobacco abuse 06/03/2014  . Obesity 06/03/2014  . Nontraumatic intracranial hemorrhage 05/30/2014  . Benign paroxysmal positional vertigo 04/28/2014  . 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 Right carotid bruit   . Polyneuropathy, diabetic 08/05/2012  . Seasonal allergic rhinitis   . Trigger thumb of left hand 10/12/2011  . Acute chest wall pain 06/01/2011  . Epigastric abdominal pain 11/11/2010  . Gout   . Adenomatous polyps   . CAD (coronary artery disease)   . Essential hypertension   . Hyperlipidemia   . Type 2 diabetes, uncontrolled, with neuropathy   . Ejection fraction     Past Medical History:  Past Medical History  Diagnosis Date  . CAD (coronary artery disease)     PCI distal RCA ...2004, residual 70% LAD   /   ...nuclear...03/2007...no ischemia.Marland KitchenMarland Kitchenpreserved LV /  nuclear...03/03/2009...inferior scar..no ischemia..EF 51%  . Dyslipidemia     takes Atorvastatin daily  . Internal hemorrhoids   . Gout     takes Allopurinol daily and Colchicine as needed  . Seasonal allergic rhinitis   . Right carotid bruit   . Cyst of nasopharynx     per ENT Jeffrey [name]  . Myocardial infarction 70yr ago  . Peripheral edema     takes Lasix daily  . HTN (hypertension)     takes Cardura,Metoprolol,[name],and Amlodipine daily  . Arthritis   . GERD (gastroesophageal reflux disease)     takes Omeprazole daily  . History of colon polyps    . Type 2 diabetes, uncontrolled, with neuropathy 1989    takes Invokana daily and has an insulin pump (Dr. KLouanna [name]  . 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 Cataract extraction Bilateral 2010  . Elbow surgery Right 1997  . Balloon angioplasty, artery  1992, 2004    CAD, Dr. KRon [name] . Colonoscopy  04/03/2002    adenomatous polyp, int hemorrhoids  . Colonoscopy  07/18/2007    normal (Dr. SFuller Plan  . Rotator cuff repair  09/2011    left, with subacromial decompression  . Nasal septum surgery    . Colonoscopy  09/26/2012    tubular adenoma, sm int hem, rpt 5 yrs ([facility] Plan  . Tonsillectomy and adenoidectomy    . Eye lids raised    . Cardiac catheterization  2004  . Polypectomy N/A 05/27/2013    Procedure: ENDOSCOPIC NASOPHARYNGEAL MASS;  Surgeon: Jeffrey [name]    Assessment & Plan  Clinical Impression: NREYNARD CHRISTOFFERSENis a 74y.o. right handed male with history of hypertension, diabetes mellitus and peripheral neuropathy maintained on Invokana and has an insulin pump(Dr. KBuddy [name], coronary artery disease with balloon angioplasty. Independent prior to admission living with his wife. Presented 05/30/2014 with right sided weakness and slurred speech. Cranial CT scan showed acute left thalamic and left internal capsule parenchymal hemorrhage compatible with hypertensive hemorrhage. MRI again notes left thalamic hemorrhage showing  some increased intraventricular and bilateral subarachnoid hemorrhage since recent cranial CT. MRA of the head was negative. Carotid Dopplers with no ICA stenosis. Echocardiogram with ejection fraction of 54% grade 1 diastolic dysfunction. Neurology follow-up with conservative care. Tolerating a regular consistency diet. Bouts of loose stool with C. difficile specimen 06/02/2014 negative. Physical therapy evaluation completed and ongoing with  recommendations of physical medicine rehabilitation consult. Patient was admitted for a comprehensive rehabilitation program.  Patient currently requires total A with mobility secondary to decreased standing balance, decreased postural control and hemiplegia.  Prior to hospitalization, patient was independent  with mobility and lived with Spouse in a House home.  Home access is 9-10 Stairs to enter.  Patient will benefit from skilled PT intervention to maximize safe functional mobility, minimize fall risk and decrease caregiver burden for planned discharge home with 24 hour supervision.  Anticipate patient will benefit from follow up [place] at discharge.  PT - End of Session Activity Tolerance: Tolerates < 10 min activity, no significant change in vital signs Endurance Deficit: Yes Endurance Deficit Description: decreased oxygen support PT Assessment Rehab Potential (ACUTE/IP ONLY): Good Barriers to Discharge: Decreased caregiver support PT Plan PT Intensity: Minimum of 1-2 x/day ,45 to 90 minutes PT Duration Estimated Length of Stay: 21-28 days PT Treatment/Interventions: Ambulation/gait training;DME/adaptive equipment instruction;Functional mobility training;Neuromuscular re-education;Balance/vestibular training;Patient/family education;Splinting/orthotics;Therapeutic Activities;UE/LE Strength taining/ROM;UE/LE Coordination activities;Wheelchair propulsion/positioning;Therapeutic Exercise PT Transfers Anticipated Outcome(s): Supervision - Min A PT Locomotion Anticipated Outcome(s): Supervision at w/c level PT Recommendation Recommendations for Other Services: Neuropsych consult Follow Up Recommendations: Home health PT;24 hour supervision/assistance;Skilled nursing facility;Other (comment) (Home vs. SNF pending pt progress, support at D/C) Patient destination: Home (Home vs. SNF) Equipment Recommended: To be determined  Skilled Therapeutic Intervention Pt was received in w/c; agreeable to  therapy. PT evaluation performed. See below for detailed findings. Treatment initiated. Session focused on patient education and transfers. See below for detailed description of assist/cueing required with bed<>chair transfers. Educated pt on findings, goals, and plan of care. Oriented pt to rehab unit, fall precautions. Pt will likely require reinforcement. No family present at this time.  Pt c/o pain/discomfort in rib cage and difficulty taking a deep breathe, RN/PA made aware.  Due to breathing difficulty, pt required several breaks throughout the eval.  Pt transferred w/c<>bed +2A with max tactile and verbal cues (see below for details).  Pt left in w/c with OT.       PT Evaluation Precautions/Restrictions Precautions Precautions: Fall Precaution comments: Due to R sided weakness Restrictions Weight Bearing Restrictions: No General   Vital SignsTherapy Vitals Temp: 98.2 F (36.8 C) Temp Source: Oral Pulse Rate: 90 Resp: 16 BP: 99/82 mmHg Patient Position (if appropriate): Lying Oxygen Therapy SpO2: 93 % O2 Device: Not Delivered Pain Pain Assessment: 0-10 Pain Score: 5 Pain Type: Acute Pain Pain Location: Chest Pain Descriptors/Indicators: Crushing  Onset: Unable to tell Interventions: RN made aware Home Living/Prior Functioning Home Living Living Arrangements: Spouse/significant other Available Help at Discharge: Family (daughter/wife available, but not 24/7) Type of Home: House Home Access: Stairs to enter [employer] of Steps: 9-10 (need clarification of home set up) Entrance Stairs-Rails: Can reach both Home Layout: One level Additional Comments: wife with recent rotator cuff repair cu[facility]ly at [facility]  Lives With: Spouse Prior Function Level of Independence: Independent with basic ADLs;Independent with transfers;Independent with gait;Independent with homemaking with ambulation  Able to Take Stairs?: Yes Driving: Yes Vocation: Retired Leisure:  Hobbies-no Comments: Retired Ambulance person - [employer]  Alignment: Within Functional Limits Diplopia Assessment: Disappears with one eye closed;Objects split on top of one another;Present all the time/all directions Additional Comments: Pt reports occluded glasses reduce diplopia  Cognition Orientation Level: Oriented to person;Oriented to place;Oriented to time;Oriented to situation Attention: Sustained Sustained Attention: Appears intact Memory: Impaired Memory Impairment: Storage deficit;Retrieval deficit;Decreased recall of new information Awareness: Impaired Awareness Impairment: Emergent impairment Problem Solving: Impaired Problem Solving Impairment: Functional basic Safety/Judgment: Impaired Sensation Sensation  Light Touch: Impaired by gross assessment in R LE Stereognosis: Not tested Hot/Cold: Not tested Proprioception: Impaired by gross assessment in R LE Coordination Gross Motor Movements are Fluid and Coordinated: No Fine Motor Movements are Fluid and Coordinated: No Finger Nose Finger Test: not assessed due to decreased Rt shoulder ROM Motor  Motor Motor: Hemiplegia  Mobility Bed Mobility Bed Mobility: Not assessed Transfers Transfers: Yes Squat Pivot Transfers: 1: +2 Total assist Squat Pivot Transfer Details: Tactile cues for sequencing;Tactile cues for weight shifting;Tactile cues for placement;Verbal cues for sequencing;Verbal cues for technique;Verbal cues for precautions/safety Locomotion  Ambulation Ambulation: No Gait Gait: No Stairs / Additional Locomotion Stairs: No Wheelchair Mobility Wheelchair Mobility: No  Trunk/Postural Assessment  Cervical Assessment Cervical Assessment: Within Functional Limits Thoracic Assessment Thoracic Assessment: Within Functional Limits Lumbar Assessment Lumbar Assessment: Within Functional Limits Postural Control Postural Control: Deficits on evaluation Trunk Control: Pt demonstrated  lateral lean to R in sitting.   Balance Balance Balance Assessed: No Extremity Assessment  RUE Assessment RUE Assessment: Exceptions to [facility] RUE AROM (degrees) RUE Overall AROM Comments: shoulder flexion approx 20 degrees, elbow WFL, decreased finger extension LUE Assessment LUE Assessment: Within Functional Limits  LLE Assessment LLE Assessment: Exceptions to [name] LLE Strength Left Hip Flexion: 4/5 Left Knee Flexion: 4/5 Left Knee Extension: 4/5 Left Ankle Dorsiflexion: 4+/5 Left Ankle Plantar Flexion: 4+/5  RLE Assessment RLE Assessment: Exceptions to [facility] RLE Strength Right Hip Flexion: 2-/5 Right Knee Flexion: 3-/5 Right Knee Extension: 3-/5 Right Ankle Dorsiflexion: 3-/5 Left Ankle Plantar Flexion: 4+/5   FIM:  FIM - Bed/Chair Transfer Bed/Chair Transfer Assistive Devices: Arm rests Bed/Chair Transfer: 1: Two helpers FIM - Locomotion: Wheelchair Locomotion: Wheelchair: 0: Activity did not occur FIM - Locomotion: Ambulation Locomotion: Ambulation: 0: Activity did not occur FIM - Locomotion: Stairs Locomotion: Stairs: 0: Activity did not occur   Refer to Care Plan for Long Term Goals  Recommendations for other services: None  Discharge Criteria: Patient will be discharged from PT if patient refuses treatment 3 consecutive times without medical reason, if treatment goals not met, if there is a change in medical status, if patient makes no progress towards goals or if patient is discharged from hospital.  The above assessment, treatment plan, treatment alternatives and goals were discussed and mutually agreed upon: by patient  Tequilla Cousineau, SPT 06/04/2014, 3:57 PM

## 2014-06-04 NOTE — Progress Notes (Signed)
[place] PHYSICAL MEDICINE & REHABILITATION     PROGRESS NOTE    Subjective/Complaints: Breathing issues overnight. Patient denies any problems this morning although he does feel tired.  Objective: Vital Signs: Blood pressure 134/72, pulse 95, temperature 98.6 F (37 C), temperature source Oral, resp. rate 18, weight 89.359 kg (197 lb), SpO2 94 %. Dg Chest Port 1 View  06/04/2014   CLINICAL DATA:  Acute onset of shortness of breath. Initial encounter.  EXAM: PORTABLE CHEST - 1 VIEW  COMPARISON:  Chest radiograph performed 05/30/2014  FINDINGS: The lungs are hypoexpanded. Patchy left-sided airspace opacity raises concern for mild pneumonia, though mildly asymmetric interstitial edema might have a similar appearance. No pleural effusion or pneumothorax is seen, though the left costophrenic angle is incompletely imaged on this study.  The cardiomediastinal silhouette is within normal limits. No acute osseous abnormalities are seen.  IMPRESSION: Lungs hypoexpanded. Patchy left-sided airspace opacity raises concern for mild pneumonia, though mildly asymmetric interstitial edema might have a similar appearance.   Electronically Signed   By: Garald Balding M.D.   On: 06/04/2014 01:41    Recent Labs  06/04/14 0602  WBC 15.3*  HGB 15.7  HCT 46.0  PLT 120*    Recent Labs  06/04/14 0602  NA 139  K 3.8  CL 108  GLUCOSE 183*  BUN 20  CREATININE 1.16  CALCIUM 9.6   CBG (last 3)   Recent Labs  06/03/14 1726 06/03/14 2127 06/04/14 0655  GLUCAP 157* 169* 171*    Wt Readings from Last 3 Encounters:  06/04/14 89.359 kg (197 lb)  04/28/14 90.774 kg (200 lb 1.9 oz)  05/27/13 92.08 kg (203 lb)    Physical Exam:  Constitutional: He is oriented to person, place, and time. He appears well-developed.  HENT: oral mucosa pink and moist Head: Normocephalic.  Eyes: EOM are normal.  Neck: Normal range of motion. Neck supple. No thyromegaly present.  Cardiovascular: Normal rate and  regular rhythm. no murmur Respiratory: Effort normal--?decrased sounds at bases. No respiratory distress.  GI: Soft. Bowel sounds are normal. He exhibits no distension.  Neurological: He is alert and oriented to person, place, and time.  Patient makes good eye contact with examiner. He is oriented to person, place and date of birth. Follow simple commands. Has difficulty with focus and sustaining attention. Is aware why he's in the hospital and that he is in rehab. Speech remains dysarthric. Has difficulties consistently initiating movement of the right arm more than the right leg. RUE: 3 to 3+/5 delt,bicep, tricep, HI. RLE 3+ to 4/5 HF, KE ADF/APF but also still inconsistent. Sensation tr/2 RUE and 1+/2 RLE. LUE and LLE display fairly functional movement. Sensation appears intact to light touch and pain in the left arm and leg. Skin: Skin is warm and dry.  Psychiatric: He has a normal mood and affect. His behavior is normal  Assessment/Plan: 1. Functional deficits secondary to left thalamic ICH which require 3+ hours per day of interdisciplinary therapy in a comprehensive inpatient rehab setting. Physiatrist is providing close team supervision and 24 hour management of active medical problems listed below. Physiatrist and rehab team continue to assess barriers to discharge/monitor patient progress toward functional and medical goals. FIM:                   Comprehension Comprehension Mode: Auditory Comprehension: 5-Understands basic 90% of the time/requires cueing < 10% of the time  Expression Expression Mode: Verbal Expression: 5-Expresses basic 90% of the time/requires  cueing < 10% of the time.  Social Interaction Social Interaction: 6-Interacts appropriately with others with medication or extra time (anti-anxiety, antidepressant).     Memory Memory: 5-Recognizes or recalls 90% of the time/requires cueing < 10% of the time  1. Functional deficits secondary to left  thalamic ICH 2. DVT Prophylaxis/Anticoagulation: SCDs. Monitor for any signs of DVT 3. Pain Management: Tylenol as needed 4. Hypertension. Norvasc 10 mg daily, lisinopril 10 mg daily. Monitor with increased mobility 5. Neuropsych: This patient is capable of making decisions on his own behalf. 6. Skin/Wound Care: Routine skin checks 7. Fluids/Electrolytes/Nutrition: Strict I and O's with follow-up chemistries 8. Diabetes mellitus with peripheral neuropathy. Hemoglobin A1c 7.8. Sliding scale insulin. Patient on Invokana 3 mg daily prior to admission and has an insulin pump followed by Dr. Buddy Duty. Resume as needed. Check blood sugars before meals and at bedtime 9. CAD with balloon angioplasty. No chest pain or shortness of breath 10. Low grade temp,leukocytosis. Left lung infiltrate?--change to levaquin and monitor today  -recheck cbc tomorrow  LOS (Days) 1 A FACE TO FACE EVALUATION WAS PERFORMED  SWARTZ,ZACHARY T 06/04/2014 8:49 AM

## 2014-06-04 NOTE — Care Management (Signed)
[facility] Individual Statement of Services  Patient Name:  Jeffrey [name]  Date:  06/04/2014  Welcome to the [facility].  Our goal is to provide you with an individualized program based on your diagnosis and situation, designed to meet your specific needs.  With this comprehensive rehabilitation program, you will be expected to participate in at least 3 hours of rehabilitation therapies Monday-Friday, with modified therapy programming on the weekends.  Your rehabilitation program will include the following services:  Physical Therapy (PT), Occupational Therapy (OT), Speech Therapy (ST), 24 hour per day rehabilitation nursing, Therapeutic Recreaction (TR), Case Management (Social Worker), Rehabilitation Medicine, Nutrition Services and Pharmacy Services  Weekly team conferences will be held on Wednesday to discuss your progress.  Your Social Worker will talk with you frequently to get your input and to update you on team discussions.  Team conferences with you and your family in attendance may also be held.  Expected length of stay: 24-28 days                                           Overall anticipated outcome: Min assist level overall   Depending on your progress and recovery, your program may change. Your Social Worker will coordinate services and will keep you informed of any changes. Your Social Worker's name and contact numbers are listed  below.  The following services may also be recommended but are not provided by the [facility] will be made to provide these services after discharge if needed.  Arrangements include referral to agencies that provide these services.  Your insurance has been verified to be:  [employer] Your primary doctor is:  [name]  Pertinent  information will be shared with your doctor and your insurance company.  Social Worker:  Ovidio Kin, [place] or ([phone]  Information discussed with and copy given to patient by: Elease Hashimoto, 06/04/2014, 11:10 AM

## 2014-06-04 NOTE — Evaluation (Signed)
Occupational Therapy Assessment and Plan  Patient Details  Name: Jeffrey [name] MRN: [id number] Date of Birth: 02-09-1941  OT Diagnosis: abnormal posture, cognitive deficits, disturbance of vision, hemiplegia affecting dominant side and muscle weakness (generalized) Rehab Potential: Rehab Potential (ACUTE ONLY): Good ELOS: 24-28 days   Today's Date: 06/04/2014 OT Individual Time: 6256-3893 OT Individual Time Calculation (min): 60 min     Problem List:  Patient Active Problem List   Diagnosis Date Noted  . Intracerebral hemorrhage 06/04/2014  . Tobacco abuse 06/03/2014  . Obesity 06/03/2014  . Nontraumatic intracranial hemorrhage 05/30/2014  . Benign paroxysmal positional vertigo 04/28/2014  . 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 Right carotid bruit   . Polyneuropathy, diabetic 08/05/2012  . Seasonal allergic rhinitis   . Trigger thumb of left hand 10/12/2011  . Acute chest wall pain 06/01/2011  . Epigastric abdominal pain 11/11/2010  . Gout   . Adenomatous polyps   . CAD (coronary artery disease)   . Essential hypertension   . Hyperlipidemia   . Type 2 diabetes, uncontrolled, with neuropathy   . Ejection fraction     Past Medical History:  Past Medical History  Diagnosis Date  . CAD (coronary artery disease)     PCI distal RCA ...2004, residual 70% LAD   /   ...nuclear...03/2007...no ischemia.Marland KitchenMarland Kitchenpreserved LV /  nuclear...03/03/2009...inferior scar..no ischemia..EF 51%  . Dyslipidemia     takes Atorvastatin daily  . Internal hemorrhoids   . Gout     takes Allopurinol daily and Colchicine as needed  . Seasonal allergic rhinitis   . Right carotid bruit   . Cyst of nasopharynx     per ENT Wilburn Cornelia  . Myocardial infarction 39yr ago  . Peripheral edema     takes Lasix daily  . HTN (hypertension)     takes Cardura,Metoprolol,[name],and Amlodipine daily  . Arthritis   . GERD (gastroesophageal reflux disease)     takes Omeprazole daily  . History of colon polyps   .  Type 2 diabetes, uncontrolled, with neuropathy 1989    takes Invokana daily and has an insulin pump ([name]  . 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 Cataract extraction Bilateral 2010  . Elbow surgery Right 1997  . Balloon angioplasty, artery  1992, 2004    CAD, Dr. KRon Parker . Colonoscopy  04/03/2002    adenomatous polyp, int hemorrhoids  . Colonoscopy  07/18/2007    normal (Dr. SFuller Plan  . Rotator cuff repair  09/2011    left, with subacromial decompression  . Nasal septum surgery    . Colonoscopy  09/26/2012    tubular adenoma, sm int hem, rpt 5 yrs (Fuller Plan  . Tonsillectomy and adenoidectomy    . Eye lids raised    . Cardiac catheterization  2004  . Polypectomy N/A 05/27/2013    Procedure: ENDOSCOPIC NASOPHARYNGEAL MASS;  Surgeon: DJerrell Belfast MD    Assessment & Plan Clinical Impression: Patient is a 74y.o. right handed male with history of hypertension, diabetes mellitus and peripheral neuropathy maintained on Invokana and has an insulin pump(Dr. KBuddy Duty, coronary artery disease with balloon angioplasty. Independent prior to admission living with his wife. Presented 05/30/2014 with right sided weakness and slurred speech. Cranial CT scan showed acute left thalamic and left internal capsule parenchymal hemorrhage compatible with hypertensive hemorrhage. MRI again notes left thalamic hemorrhage showing some increased intraventricular and bilateral  subarachnoid hemorrhage since recent cranial CT. MRA of the head was negative. Carotid Dopplers with no ICA stenosis. Echocardiogram with ejection fraction of 16% grade 1 diastolic dysfunction. Neurology follow-up with conservative care. Tolerating a regular consistency diet. Bouts of loose stool with C. difficile specimen 06/02/2014 negative. Physical therapy evaluation completed and ongoing with recommendations of  physical medicine rehabilitation consult.  Patient transferred to CIR on 06/03/2014 .    Patient currently requires total with basic self-care skills secondary to muscle weakness, decreased cardiorespiratoy endurance and decreased oxygen support, impaired timing and sequencing, abnormal tone, unbalanced muscle activation and decreased coordination, decreased visual motor skills, decreased awareness, decreased problem solving and decreased memory and decreased sitting balance, decreased standing balance, decreased postural control and hemiplegia.  Prior to hospitalization, patient could complete ADLs with independent .  Patient will benefit from skilled intervention to decrease level of assist with basic self-care skills prior to discharge home with care partner.  Anticipate patient will require 24 hour supervision and minimal physical assistance and follow up home health.  OT - End of Session Activity Tolerance: Tolerates 30+ min activity with multiple rests Endurance Deficit: Yes Endurance Deficit Description: decreased oxygen support OT Assessment Rehab Potential (ACUTE ONLY): Good Barriers to Discharge: Decreased caregiver support Barriers to Discharge Comments: wife is currently in SNF for rotator cuff repair, question if she can provide supervision to min assist OT Patient demonstrates impairments in the following area(s): Balance;Cognition;Endurance;Motor;Perception;Safety;Sensory;Vision OT Basic ADL's Functional Problem(s): Grooming;Bathing;Dressing;Toileting OT Transfers Functional Problem(s): Toilet;Tub/Shower OT Additional Impairment(s): Fuctional Use of Upper Extremity OT Plan OT Intensity: Minimum of 1-2 x/day, 45 to 90 minutes OT Frequency: 5 out of 7 days OT Duration/Estimated Length of Stay: 24-28 days OT Treatment/Interventions: Balance/vestibular training;Cognitive remediation/compensation;Discharge planning;Disease mangement/prevention;DME/adaptive equipment  instruction;Functional mobility training;Neuromuscular re-education;Pain management;Patient/family education;Psychosocial support;Self Care/advanced ADL retraining;Splinting/orthotics;Therapeutic Activities;Therapeutic Exercise;UE/LE Strength taining/ROM;UE/LE Coordination activities;Visual/perceptual remediation/compensation OT Self Feeding Anticipated Outcome(s): Supervision OT Basic Self-Care Anticipated Outcome(s): Min assist OT Toileting Anticipated Outcome(s): Min assist OT Bathroom Transfers Anticipated Outcome(s): Min assist OT Recommendation Patient destination: Home (SNF if family unable to provide min assist- supervision) Follow Up Recommendations: Home health OT;Skilled nursing facility Equipment Recommended: 3 in 1 bedside comode;Tub/shower bench   Skilled Therapeutic Intervention OT eval completed with discussion of OT purpose and estimated goals and LOS.  ADL assessment completed at sit > stand level at sink with pt requiring total assist for LB dressing secondary to Rt hemiplegia and decreased standing balance.  Pt did attempt to pull up Lt side of pants while standing.  Total assist with sit > stand with pt demonstrating carryover of education at beginning of session of forward weight shift.  Pt reports diplopia in all quadrants, use of taping intact on glasses to correct for diplopia.  Pt demonstrated finger flexion and extension, with difficulty fully extending fingers and difficulty with thumb opposition.    OT Evaluation Precautions/Restrictions  Precautions Precautions: Fall Precaution Comments: due to right sided weakness Restrictions Weight Bearing Restrictions: No Pain Pain Assessment Pain Assessment: No/denies pain Home Living/Prior Functioning Home Living Living Arrangements: Spouse/significant other Available Help at Discharge: Family (daughter/wife available, but not 24/7) Type of Home: House Home Access: Stairs to enter [employer] of Steps:  9-10 Entrance Stairs-Rails: Can reach both Home Layout: One level Additional Comments: wife with recent rotator cuff repair curently at [facility]  Lives With: Spouse Prior Function Level of Independence: Independent with basic ADLs, Independent with transfers, Independent with gait, Independent with homemaking with ambulation  Able to Take  Stairs?: Yes Driving: Yes Vocation: Retired Leisure: Hobbies-no Comments: Retired Theme park manager ADL  See FIM Vision/Perception  Vision- History Baseline Vision/History: Wears glasses Wears Glasses: At all times Patient Visual Report: Diplopia Vision- Assessment Vision Assessment?: Yes Eye Alignment: Within Functional Limits Visual Fields: No apparent deficits Diplopia Assessment: Disappears with one eye closed;Objects split on top of one another;Present all the time/all directions Additional Comments: Pt reports occluded glasses reduce diplopia  Cognition Orientation Level: Oriented to person;Oriented to place;Oriented to time;Oriented to situation Attention: Sustained Sustained Attention: Appears intact Memory: Impaired Memory Impairment: Storage deficit;Retrieval deficit;Decreased recall of new information Awareness: Impaired Awareness Impairment: Emergent impairment Problem Solving: Impaired Problem Solving Impairment: Functional basic Safety/Judgment: Impaired Sensation Sensation Light Touch: Impaired by gross assessment Stereognosis: Not tested Hot/Cold: Not tested Proprioception: Impaired by gross assessment Coordination Fine Motor Movements are Fluid and Coordinated: No Finger Nose Finger Test: not assessed due to decreased Rt shoulder ROM Extremity/Trunk Assessment RUE Assessment RUE Assessment: Exceptions to [facility] RUE AROM (degrees) RUE Overall AROM Comments: shoulder flexion approx 20 degrees, elbow WFL, decreased finger extension LUE Assessment LUE Assessment: Within Functional Limits  FIM:  FIM - Grooming Grooming  Steps: Wash, rinse, dry face Grooming: 2: Patient completes 1 of 4 or 2 of 5 steps FIM - Bathing Bathing Steps Patient Completed: Chest;Abdomen;Front perineal area;Right upper leg;Left upper leg Bathing: 3: Mod-Patient completes 5-7 24f10 parts or 50-74% FIM - Upper Body Dressing/Undressing Upper body dressing/undressing steps patient completed: Thread/unthread left sleeve of pullover shirt/dress;Pull shirt over trunk Upper body dressing/undressing: 3: Mod-Patient completed 50-74% of tasks FIM - Lower Body Dressing/Undressing Lower body dressing/undressing: 1: Total-Patient completed less than 25% of tasks FIM - BControl and instrumentation engineerDevices: Arm rests Bed/Chair Transfer: 1: Two helpers   Refer to [place] Go[place]  Recommendations for other services: Neuropsych  Discharge Criteria: Patient will be discharged from OT if patient refuses treatment 3 consecutive times without medical reason, if treatment goals not met, if there is a change in medical status, if patient makes no progress towards goals or if patient is discharged from hospital.  The above assessment, treatment plan, treatment alternatives and goals were discussed and mutually agreed upon: by patient  Alice Burnside, [facility]3/30/2016, 1:28 PM

## 2014-06-04 NOTE — Progress Notes (Signed)
Social Work Assessment and Plan Social Work Assessment and Plan  Patient Details  Name: Jeffrey [name] MRN: [id number] Date of Birth: 04-23-1940  Today's Date: 06/04/2014  Problem List:  Patient Active Problem List   Diagnosis Date Noted  . Intracerebral hemorrhage 06/04/2014  . Tobacco abuse 06/03/2014  . Obesity 06/03/2014  . Nontraumatic intracranial hemorrhage 05/30/2014  . Benign paroxysmal positional vertigo 04/28/2014  . 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 Right carotid bruit   . Polyneuropathy, diabetic 08/05/2012  . Seasonal allergic rhinitis   . Trigger thumb of left hand 10/12/2011  . Acute chest wall pain 06/01/2011  . Epigastric abdominal pain 11/11/2010  . Gout   . Adenomatous polyps   . CAD (coronary artery disease)   . Essential hypertension   . Hyperlipidemia   . Type 2 diabetes, uncontrolled, with neuropathy   . Ejection fraction    Past Medical History:  Past Medical History  Diagnosis Date  . CAD (coronary artery disease)     PCI distal RCA ...2004, residual 70% LAD   /   ...nuclear...03/2007...no ischemia.Marland KitchenMarland Kitchenpreserved LV /  nuclear...03/03/2009...inferior scar..no ischemia..EF 51%  . Dyslipidemia     takes Atorvastatin daily  . Internal hemorrhoids   . Gout     takes Allopurinol daily and Colchicine as needed  . Seasonal allergic rhinitis   . Right carotid bruit   . Cyst of nasopharynx     per ENT Wilburn Cornelia  . Myocardial infarction 83yrs ago  . Peripheral edema     takes Lasix daily  . HTN (hypertension)     takes Cardura,Metoprolol,[name],and Amlodipine daily  . Arthritis   . GERD (gastroesophageal reflux disease)     takes Omeprazole daily  . History of colon polyps   . Type 2 diabetes, uncontrolled, with neuropathy 1989    takes Invokana daily and has an insulin pump (Dr. [name])  . 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 Cataract extraction Bilateral 2010  . Elbow surgery Right 1997  . Balloon angioplasty, artery  1992, 2004    CAD, Dr. Ron Parker  . Colonoscopy  04/03/2002    adenomatous polyp, int hemorrhoids  . Colonoscopy  07/18/2007    normal (Dr. Fuller Plan)  . Rotator cuff repair  09/2011    left, with subacromial decompression  . Nasal septum surgery    . Colonoscopy  09/26/2012    tubular adenoma, sm int hem, rpt 5 yrs Fuller Plan)  . Tonsillectomy and adenoidectomy    . Eye lids raised    . Cardiac catheterization  2004  . Polypectomy N/A 05/27/2013    Procedure: ENDOSCOPIC NASOPHARYNGEAL MASS;  Surgeon: [name], MD   Social History:  reports that he has quit smoking. He has never used smokeless tobacco. He reports that he does not drink alcohol or use illicit drugs.  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 Can be there with pt unsure how much she can assist) Family Dynamics:  Close knit with both children-but are busy and have their own families.  They have a strong church family, someone brings wife daily to the hospital to see her husband from the NH.  Want pt home but needs to be safe and need to see how much progress he makes here.  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   Abuse/Neglect Physical Abuse: Denies Verbal Abuse: Denies Sexual Abuse: Denies Exploitation of patient/patient's resources: Denies Self-Neglect: Denies  Emotional Status Pt's affect, behavior adn adjustment status: Pt is not having a good day today-vertigo and lung issues.  MD is treating him for pneumonia now and therapies tired him out, sleeping now.  He wants to regain his independence and be able to resume his active lifestyle.  He was helping his wife prior to admission and her surgery. Recent Psychosocial Issues: Other medical issues-MD now treating his pneumonia, knee has been buckling now since stroke. Pyschiatric History: No history-deferred depression screen due to pt not able to complete he is medically having issues and he is exhausted from therapies.  Will try tomorrow.  He has a very strong faith and this hleps him through difficulties or rough patches.  Wife reports he is a very private man, one who internalizes and solves his issues. Substance Abuse History: No issues  Patient / Family Perceptions, Expectations & Goals Pt/Family understanding of illness & functional limitations: Wife can explain this stroke and is very hopeful he will recover from this and has many Scientist, water quality praying for him. She ahs spoken with MD and feels her questions are being answered. Pt is aware he has had a stroke and his deficits. Hard to determine at this point due to pt not having a good day. Premorbid pt/family roles/activities: Husband, father, Doristine Bosworth, Home owner, etc Anticipated changes in roles/activities/participation: resume Pt/family expectations/goals: Pt states: " I wish I felt better, then I could do  more."  Wife states: " I hope and pray he does well here and can go home from here and not somewhere else."  [employer]: Other (Comment) (Wife currently at [employer]) Premorbid Home Care/DME Agencies: None Transportation available at discharge: Family and church members Resource referrals recommended: Neuropsychology, Support group (specify)  Discharge Planning Living Arrangements: Spouse/significant other Support Systems: Spouse/significant other, Children, Social worker community, Friends/neighbors Type of Residence: Private residence Administrator, sports: Multimedia programmer (specify) Psychologist, prison and probation services) Museum/gallery curator Resources: Fish farm manager, Other (Comment) Financial Screen Referred: No Living Expenses: Lives with family Money Management: Spouse, Patient Does the patient have any problems obtaining your medications?: No Home Management: Both pt was having to do most due to wife's shoulder issue and balance issues Patient/Family Preliminary Plans: Unsure at this time what the plan is, dependent upon pt's progress and waht he will be able to do for himself.  Wife plans to be at [facility] for two-three more weeks and then home, but will be limited in what she will be able to do for herself let alone someone else. Thye do have numerous church members willing to help and local daughter.  Will need to see his goals and come up with a safe plan. Social Work Anticipated Follow Up Needs: HH/OP,  SNF, Support Group  Clinical Impression Pleasant gentleman who is willing to work but is feeling poorly and having chest discomfort.  His wife is supportive but limited due to her own health issues and recovery from shoulder surgery. Encouraged wife to discuss with family and church member what they can assist with and are aware of the options to hire assist or go to a NH if longer more rehab is needed and unable to provide At home. Wife will discuss with children and we will all  come up with a plan for discharge.  Elease Hashimoto 06/04/2014, 3:29 PM

## 2014-06-04 NOTE — Evaluation (Signed)
Speech Language Pathology Assessment and Plan  Patient Details  Name: Jeffrey [name] MRN: [id number] Date of Birth: [birth year]  SLP Diagnosis: Speech and Language deficits;Cognitive Impairments  Rehab Potential: Good ELOS: 28 days     Today's Date: 06/04/2014 SLP Individual Time: 1100-1200 SLP Individual Time Calculation (min): 60 min   Problem List:  Patient Active Problem List   Diagnosis Date Noted  . Intracerebral hemorrhage 06/04/2014  . Tobacco abuse 06/03/2014  . Obesity 06/03/2014  . Nontraumatic intracranial hemorrhage 05/30/2014  . Benign paroxysmal positional vertigo 04/28/2014  . 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 Right carotid bruit   . Polyneuropathy, diabetic 08/05/2012  . Seasonal allergic rhinitis   . Trigger thumb of left hand 10/12/2011  . Acute chest wall pain 06/01/2011  . Epigastric abdominal pain 11/11/2010  . Gout   . Adenomatous polyps   . CAD (coronary artery disease)   . Essential hypertension   . Hyperlipidemia   . Type 2 diabetes, uncontrolled, with neuropathy   . Ejection fraction    Past Medical History:  Past Medical History  Diagnosis Date  . CAD (coronary artery disease)     PCI distal RCA ...2004, residual 70% LAD   /   ...nuclear...03/2007...no ischemia.Marland KitchenMarland Kitchenpreserved LV /  nuclear...03/03/2009...inferior scar..no ischemia..EF 51%  . Dyslipidemia     takes Atorvastatin daily  . Internal hemorrhoids   . Gout     takes Allopurinol daily and Colchicine as needed  . Seasonal allergic rhinitis   . Right carotid bruit   . Cyst of nasopharynx     per ENT Wilburn Cornelia  . Myocardial infarction 26yr ago  . Peripheral edema     takes Lasix daily  . HTN (hypertension)     takes Cardura,Metoprolol,[name],and Amlodipine daily  . Arthritis   . GERD (gastroesophageal reflux disease)     takes Omeprazole daily  . History of colon polyps   . Type 2 diabetes, uncontrolled, with neuropathy 1989    takes Invokana daily and has an insulin pump  ([name]  . 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 Cataract extraction Bilateral 2010  . Elbow surgery Right 1997  . Balloon angioplasty, artery  1992, 2004    CAD, Dr. KRon Parker . Colonoscopy  04/03/2002    adenomatous polyp, int hemorrhoids  . Colonoscopy  07/18/2007    normal (Dr. SFuller Plan  . Rotator cuff repair  09/2011    left, with subacromial decompression  . Nasal septum surgery    . Colonoscopy  09/26/2012    tubular adenoma, sm int hem, rpt 5 yrs ([facility] Plan  . Tonsillectomy and adenoidectomy    . Eye lids raised    . Cardiac catheterization  2004  . Polypectomy N/A 05/27/2013    Procedure: ENDOSCOPIC NASOPHARYNGEAL MASS;  Surgeon: DJerrell Belfast MD    Assessment / Plan / Recommendation Clinical Impression  NPACER DORNis a 74y.o. right handed male with history of hypertension, diabetes mellitus and peripheral neuropathy maintained on Invokana and has an insulin pump(Dr. KBuddy Duty, coronary artery disease with balloon angioplasty. Independent prior to admission living with his wife. Presented 05/30/2014 with right sided weakness and slurred speech. Cranial CT scan showed acute left thalamic and left internal capsule parenchymal hemorrhage compatible with hypertensive hemorrhage. MRI again notes left thalamic hemorrhage showing some increased intraventricular and bilateral subarachnoid hemorrhage since recent cranial CT. Tolerating a  regular consistency diet.  Patient was admitted for a comprehensive rehabilitation program 06/03/14.  SLP evaluation completed 06/04/2014 with the following results:  Pt presents with mild-moderate cognitive deficits characterized by delayed processing speed, decreased emergent awareness of deficits, decreased recall of new information, and decreased functional problem solving during basic home management tasks. Additionally, pt  presents with mild word finding deficits in conversations which SLP suspects to be related to cognitive deficits and lethargy.  Given pt's previous level of independence, he would benefit from skilled ST while inpatient in order to maximize functional independence and reduce burden of care prior to discharge   Skilled Therapeutic Interventions          Cognitive-linguistic evaluation completed with results and recommendations reviewed with patient and family.     SLP Assessment  Patient will need skilled [facility] during CIR admission    Recommendations  Patient destination: Home (vs SNF, will depend on whether family can provide 24/7 supervision ) Follow up Recommendations: 24 hour supervision/assistance;None;Home Health SLP;Skilled Nursing facility    SLP Frequency 3 to 5 out of 7 days   SLP Treatment/Interventions Speech/Language facilitation;Environmental controls;Patient/family education;Internal/external aids;Cognitive remediation/compensation;Cueing hierarchy;Functional tasks    Pain Pain Assessment Pain Assessment: No/denies pain Prior Functioning Cognitive/Linguistic Baseline: Within functional limits Type of Home: House  Lives With: Spouse Available Help at Discharge: Family;Available PRN/intermittently Vocation: Retired  Industrial/product designer Term Goals: Week 1: SLP Short Term Goal 1 (Week 1): Pt willl utilize compensatory aids to facilitate recall of new information during basic, familiar tasks with min assist verbal cues  SLP Short Term Goal 2 (Week 1): Pt will recognize and correct errors in the moment when completing basic, familiar home management tasks with mod assist verbal cues.   SLP Short Term Goal 3 (Week 1): Pt will return demonstration of 2 safety precautions during structured and unstructured therapeutic tasks with min assist verbal cues.   SLP Short Term Goal 4 (Week 1): Pt will utilize word finding strategies in functional conversations to convey basic  to semi-complex needs/wants with min assist.   See FIM for current functional status Refer to Care Plan for Long Term Goals  Recommendations for other services: None  Discharge Criteria: Patient will be discharged from SLP if patient refuses treatment 3 consecutive times without medical reason, if treatment goals not met, if there is a change in medical status, if patient makes no progress towards goals or if patient is discharged from hospital.  The above assessment, treatment plan, treatment alternatives and goals were discussed and mutually agreed upon: by patient and by family  Emmet Messer, Selinda Orion 06/04/2014, 2:12 PM

## 2014-06-04 NOTE — Progress Notes (Signed)
Pt complains of "restricted" breathing. Lung sounds are clear but diminished in lower lobes. O2 at 92% with a HR of 98-101. Pt complains of abdomen pain. Abdomen distended and tender to touch. Respiratory assessed pt. Pt on 2L O2. Marlowe [place], PA notified and he ordered a chest x-ray. Will continue to monitor pt. Kennieth Francois, RN'

## 2014-06-04 NOTE — Progress Notes (Signed)
Patient information reviewed and entered into eRehab system by Kassia Demarinis, RN, CRRN, PPS Coordinator.  Information including medical coding and functional independence measure will be reviewed and updated through discharge.     Per nursing patient was given "Data Collection Information Summary for Patients in Inpatient Rehabilitation Facilities with attached "Privacy Act Statement-Health Care Records" upon admission.  

## 2014-06-05 ENCOUNTER — Inpatient Hospital Stay (HOSPITAL_COMMUNITY)
Admission: AD | Admit: 2014-06-05 | Discharge: 2014-06-11 | DRG: 871 | Disposition: A | Discharge status: Rehabilitation Facility | Payer: Medicare Other | Source: Ambulatory Visit | Attending: Internal Medicine | Admitting: Internal Medicine

## 2014-06-05 ENCOUNTER — Inpatient Hospital Stay (HOSPITAL_COMMUNITY): Payer: Medicare Other

## 2014-06-05 ENCOUNTER — Encounter (HOSPITAL_COMMUNITY): Payer: Self-pay

## 2014-06-05 ENCOUNTER — Inpatient Hospital Stay (HOSPITAL_COMMUNITY): Payer: Medicare Other | Admitting: Speech Pathology

## 2014-06-05 ENCOUNTER — Inpatient Hospital Stay (HOSPITAL_COMMUNITY): Payer: Medicare Other | Admitting: Physical Therapy

## 2014-06-05 ENCOUNTER — Inpatient Hospital Stay (HOSPITAL_COMMUNITY): Payer: Medicare Other | Admitting: Occupational Therapy

## 2014-06-05 DIAGNOSIS — Z86010 Personal history of colonic polyps: Secondary | ICD-10-CM

## 2014-06-05 DIAGNOSIS — M109 Gout, unspecified: Secondary | ICD-10-CM | POA: Diagnosis present

## 2014-06-05 DIAGNOSIS — Z720 Tobacco use: Secondary | ICD-10-CM

## 2014-06-05 DIAGNOSIS — K567 Ileus, unspecified: Secondary | ICD-10-CM | POA: Diagnosis not present

## 2014-06-05 DIAGNOSIS — A419 Sepsis, unspecified organism: Principal | ICD-10-CM | POA: Diagnosis present

## 2014-06-05 DIAGNOSIS — Z794 Long term (current) use of insulin: Secondary | ICD-10-CM | POA: Diagnosis not present

## 2014-06-05 DIAGNOSIS — Z833 Family history of diabetes mellitus: Secondary | ICD-10-CM | POA: Diagnosis not present

## 2014-06-05 DIAGNOSIS — G8191 Hemiplegia, unspecified affecting right dominant side: Secondary | ICD-10-CM | POA: Diagnosis present

## 2014-06-05 DIAGNOSIS — E785 Hyperlipidemia, unspecified: Secondary | ICD-10-CM | POA: Diagnosis present

## 2014-06-05 DIAGNOSIS — Z888 Allergy status to other drugs, medicaments and biological substances status: Secondary | ICD-10-CM | POA: Diagnosis not present

## 2014-06-05 DIAGNOSIS — J9601 Acute respiratory failure with hypoxia: Secondary | ICD-10-CM | POA: Diagnosis present

## 2014-06-05 DIAGNOSIS — I629 Nontraumatic intracranial hemorrhage, unspecified: Secondary | ICD-10-CM | POA: Diagnosis not present

## 2014-06-05 DIAGNOSIS — I252 Old myocardial infarction: Secondary | ICD-10-CM

## 2014-06-05 DIAGNOSIS — J9811 Atelectasis: Secondary | ICD-10-CM | POA: Diagnosis present

## 2014-06-05 DIAGNOSIS — M1990 Unspecified osteoarthritis, unspecified site: Secondary | ICD-10-CM | POA: Diagnosis present

## 2014-06-05 DIAGNOSIS — K560 Paralytic ileus: Secondary | ICD-10-CM | POA: Diagnosis present

## 2014-06-05 DIAGNOSIS — J302 Other seasonal allergic rhinitis: Secondary | ICD-10-CM | POA: Diagnosis present

## 2014-06-05 DIAGNOSIS — E1165 Type 2 diabetes mellitus with hyperglycemia: Secondary | ICD-10-CM | POA: Diagnosis present

## 2014-06-05 DIAGNOSIS — E1149 Type 2 diabetes mellitus with other diabetic neurological complication: Secondary | ICD-10-CM | POA: Diagnosis present

## 2014-06-05 DIAGNOSIS — Z823 Family history of stroke: Secondary | ICD-10-CM

## 2014-06-05 DIAGNOSIS — I2510 Atherosclerotic heart disease of native coronary artery without angina pectoris: Secondary | ICD-10-CM | POA: Diagnosis present

## 2014-06-05 DIAGNOSIS — E1169 Type 2 diabetes mellitus with other specified complication: Secondary | ICD-10-CM | POA: Diagnosis present

## 2014-06-05 DIAGNOSIS — I5032 Chronic diastolic (congestive) heart failure: Secondary | ICD-10-CM | POA: Diagnosis present

## 2014-06-05 DIAGNOSIS — I152 Hypertension secondary to endocrine disorders: Secondary | ICD-10-CM | POA: Diagnosis present

## 2014-06-05 DIAGNOSIS — I213 ST elevation (STEMI) myocardial infarction of unspecified site: Secondary | ICD-10-CM | POA: Diagnosis not present

## 2014-06-05 DIAGNOSIS — I69251 Hemiplegia and hemiparesis following other nontraumatic intracranial hemorrhage affecting right dominant side: Secondary | ICD-10-CM | POA: Diagnosis not present

## 2014-06-05 DIAGNOSIS — Y95 Nosocomial condition: Secondary | ICD-10-CM | POA: Diagnosis present

## 2014-06-05 DIAGNOSIS — I10 Essential (primary) hypertension: Secondary | ICD-10-CM | POA: Diagnosis present

## 2014-06-05 DIAGNOSIS — E1159 Type 2 diabetes mellitus with other circulatory complications: Secondary | ICD-10-CM | POA: Diagnosis present

## 2014-06-05 DIAGNOSIS — E1140 Type 2 diabetes mellitus with diabetic neuropathy, unspecified: Secondary | ICD-10-CM | POA: Diagnosis present

## 2014-06-05 DIAGNOSIS — R339 Retention of urine, unspecified: Secondary | ICD-10-CM | POA: Diagnosis not present

## 2014-06-05 DIAGNOSIS — N179 Acute kidney failure, unspecified: Secondary | ICD-10-CM | POA: Diagnosis present

## 2014-06-05 DIAGNOSIS — I609 Nontraumatic subarachnoid hemorrhage, unspecified: Secondary | ICD-10-CM | POA: Diagnosis present

## 2014-06-05 DIAGNOSIS — D696 Thrombocytopenia, unspecified: Secondary | ICD-10-CM | POA: Diagnosis present

## 2014-06-05 DIAGNOSIS — Z79899 Other long term (current) drug therapy: Secondary | ICD-10-CM | POA: Diagnosis not present

## 2014-06-05 DIAGNOSIS — Z8249 Family history of ischemic heart disease and other diseases of the circulatory system: Secondary | ICD-10-CM | POA: Diagnosis not present

## 2014-06-05 DIAGNOSIS — J189 Pneumonia, unspecified organism: Secondary | ICD-10-CM | POA: Diagnosis present

## 2014-06-05 DIAGNOSIS — I615 Nontraumatic intracerebral hemorrhage, intraventricular: Secondary | ICD-10-CM | POA: Diagnosis present

## 2014-06-05 DIAGNOSIS — R109 Unspecified abdominal pain: Secondary | ICD-10-CM

## 2014-06-05 DIAGNOSIS — K219 Gastro-esophageal reflux disease without esophagitis: Secondary | ICD-10-CM | POA: Diagnosis present

## 2014-06-05 DIAGNOSIS — R0789 Other chest pain: Secondary | ICD-10-CM

## 2014-06-05 DIAGNOSIS — E1142 Type 2 diabetes mellitus with diabetic polyneuropathy: Secondary | ICD-10-CM | POA: Diagnosis not present

## 2014-06-05 LAB — PROCALCITONIN
PROCALCITONIN: 0.37 ng/mL
Procalcitonin: 0.28 ng/mL

## 2014-06-05 LAB — GLUCOSE, CAPILLARY
GLUCOSE-CAPILLARY: 191 mg/dL — AB (ref 70–99)
GLUCOSE-CAPILLARY: 213 mg/dL — AB (ref 70–99)
GLUCOSE-CAPILLARY: 160 mg/dL — AB (ref 70–99)
GLUCOSE-CAPILLARY: 126 mg/dL — AB (ref 70–99)
Glucose-Capillary: 160 mg/dL — ABNORMAL HIGH (ref 70–99)

## 2014-06-05 LAB — BLOOD GAS, ARTERIAL
Acid-base deficit: 10.1 mmol/L — ABNORMAL HIGH (ref 0.0–2.0)
Bicarbonate: 14.2 mEq/L — ABNORMAL LOW (ref 20.0–24.0)
DRAWN BY: 406621
FIO2: 1 %
O2 CONTENT: 15 L/min
O2 SAT: 92.4 %
PATIENT TEMPERATURE: 98.6
PO2 ART: 67.6 mmHg — AB (ref 80.0–100.0)
TCO2: 14.9 mmol/L (ref 0–100)
pCO2 arterial: 25.2 mmHg — ABNORMAL LOW (ref 35.0–45.0)
pH, Arterial: 7.368 (ref 7.350–7.450)

## 2014-06-05 LAB — CBC
HCT: 49.4 % (ref 39.0–52.0)
HEMOGLOBIN: 16.7 g/dL (ref 13.0–17.0)
MCH: 31.1 pg (ref 26.0–34.0)
MCHC: 33.8 g/dL (ref 30.0–36.0)
MCV: 92 fL (ref 78.0–100.0)
PLATELETS: 137 10*3/uL — AB (ref 150–400)
RBC: 5.37 MIL/uL (ref 4.22–5.81)
RDW: 14.1 % (ref 11.5–15.5)
WBC: 26.1 10*3/uL — ABNORMAL HIGH (ref 4.0–10.5)

## 2014-06-05 LAB — BASIC METABOLIC PANEL
ANION GAP: 17 — AB (ref 5–15)
BUN: 29 mg/dL — ABNORMAL HIGH (ref 6–23)
CHLORIDE: 104 mmol/L (ref 96–112)
CO2: 19 mmol/L (ref 19–32)
Calcium: 9.9 mg/dL (ref 8.4–10.5)
Creatinine, Ser: 1.56 mg/dL — ABNORMAL HIGH (ref 0.50–1.35)
GFR calc Af Amer: 50 mL/min — ABNORMAL LOW (ref >90)
GFR calc non Af Amer: 43 mL/min — ABNORMAL LOW (ref >90)
Glucose, Bld: 286 mg/dL — ABNORMAL HIGH (ref 70–99)
POTASSIUM: 3.7 mmol/L (ref 3.5–5.1)
SODIUM: 140 mmol/L (ref 135–145)

## 2014-06-05 LAB — D-DIMER, QUANTITATIVE (NOT AT ARMC)

## 2014-06-05 LAB — LACTIC ACID, PLASMA
LACTIC ACID, VENOUS: 3.3 mmol/L — AB (ref 0.5–2.0)
LACTIC ACID, VENOUS: 1.5 mmol/L (ref 0.5–2.0)
Lactic Acid, Venous: 2.4 mmol/L (ref 0.5–2.0)

## 2014-06-05 LAB — BRAIN NATRIURETIC PEPTIDE: B NATRIURETIC PEPTIDE 5: 86.1 pg/mL (ref 0.0–100.0)

## 2014-06-05 LAB — TROPONIN I
TROPONIN I: 0.23 ng/mL — AB (ref <0.031)
TROPONIN I: 1.25 ng/mL — AB (ref <0.031)
Troponin I: 1.56 ng/mL (ref <0.031)

## 2014-06-05 LAB — URINALYSIS, ROUTINE W REFLEX MICROSCOPIC
BILIRUBIN URINE: NEGATIVE
Glucose, UA: >1000 mg/dL — AB
Hgb urine dipstick: NEGATIVE
Ketones, ur: 40 mg/dL — AB
Leukocytes, UA: NEGATIVE
Nitrite: NEGATIVE
Protein, ur: NEGATIVE mg/dL
Specific Gravity, Urine: 1.035 — ABNORMAL HIGH (ref 1.005–1.030)
UROBILINOGEN UA: 0.2 mg/dL (ref 0.0–1.0)
pH: 5 (ref 5.0–8.0)

## 2014-06-05 LAB — URINE MICROSCOPIC-ADD ON

## 2014-06-05 MED ORDER — MORPHINE SULFATE 4 MG/ML IJ SOLN: 1.0000 mg | INTRAMUSCULAR | Status: DC | PRN

## 2014-06-05 MED ORDER — SODIUM CHLORIDE 0.9 % IV SOLN
INTRAVENOUS | Status: DC
  Administered 2014-06-05 – 2014-06-06 (×3): via INTRAVENOUS

## 2014-06-05 MED ORDER — HYDRALAZINE HCL 20 MG/ML IJ SOLN: 10.0000 mg | INTRAMUSCULAR | Status: DC | PRN

## 2014-06-05 MED ORDER — FOLIC ACID 5 MG/ML IJ SOLN
1.0000 mg | Freq: Every day | INTRAMUSCULAR | Status: DC
  Administered 2014-06-05 – 2014-06-06 (×2): 1 mg via INTRAVENOUS
  Filled 2014-06-05 (×2): qty 0.2

## 2014-06-05 MED ORDER — CHLORHEXIDINE GLUCONATE 0.12 % MT SOLN
15.0000 mL | Freq: Two times a day (BID) | OROMUCOSAL | Status: DC
  Administered 2014-06-05 – 2014-06-10 (×9): 15 mL via OROMUCOSAL
  Filled 2014-06-05 (×13): qty 15

## 2014-06-05 MED ORDER — ONDANSETRON HCL 4 MG PO TABS: 4.0000 mg | ORAL_TABLET | Freq: Four times a day (QID) | ORAL | Status: DC | PRN

## 2014-06-05 MED ORDER — PIPERACILLIN-TAZOBACTAM 3.375 G IVPB
3.3750 g | Freq: Three times a day (TID) | INTRAVENOUS | Status: DC
  Administered 2014-06-05: 3.375 g via INTRAVENOUS
  Filled 2014-06-05 (×2): qty 50

## 2014-06-05 MED ORDER — THIAMINE HCL 100 MG/ML IJ SOLN
100.0000 mg | Freq: Every day | INTRAMUSCULAR | Status: DC
  Administered 2014-06-05 – 2014-06-06 (×2): 100 mg via INTRAVENOUS
  Filled 2014-06-05 (×2): qty 1

## 2014-06-05 MED ORDER — SODIUM CHLORIDE 0.9 % IV SOLN
INTRAVENOUS | Status: DC
  Administered 2014-06-05: 10:00:00 via INTRAVENOUS

## 2014-06-05 MED ORDER — CETYLPYRIDINIUM CHLORIDE 0.05 % MT LIQD
7.0000 mL | Freq: Two times a day (BID) | OROMUCOSAL | Status: DC
  Administered 2014-06-05 – 2014-06-11 (×10): 7 mL via OROMUCOSAL

## 2014-06-05 MED ORDER — POTASSIUM CHLORIDE IN NACL 20-0.9 MEQ/L-% IV SOLN
INTRAVENOUS | Status: DC
  Filled 2014-06-05 (×2): qty 1000

## 2014-06-05 MED ORDER — INSULIN ASPART 100 UNIT/ML ~~LOC~~ SOLN
0.0000 [IU] | SUBCUTANEOUS | Status: DC
  Administered 2014-06-05: 5 [IU] via SUBCUTANEOUS
  Administered 2014-06-05 (×2): 3 [IU] via SUBCUTANEOUS
  Administered 2014-06-05 – 2014-06-06 (×2): 2 [IU] via SUBCUTANEOUS

## 2014-06-05 MED ORDER — VANCOMYCIN HCL IN DEXTROSE 750-5 MG/150ML-% IV SOLN
750.0000 mg | Freq: Two times a day (BID) | INTRAVENOUS | Status: DC
  Administered 2014-06-05: 750 mg via INTRAVENOUS
  Filled 2014-06-05 (×2): qty 150

## 2014-06-05 MED ORDER — SODIUM CHLORIDE 0.9 % IJ SOLN
3.0000 mL | Freq: Two times a day (BID) | INTRAMUSCULAR | Status: DC
  Administered 2014-06-05 – 2014-06-10 (×5): 3 mL via INTRAVENOUS

## 2014-06-05 MED ORDER — SODIUM CHLORIDE 0.9 % IV BOLUS (SEPSIS)
500.0000 mL | Freq: Once | INTRAVENOUS | Status: AC
  Administered 2014-06-05: 500 mL via INTRAVENOUS

## 2014-06-05 MED ORDER — SODIUM CHLORIDE 0.9 % IV SOLN: 4.0000 mg | Freq: Four times a day (QID) | INTRAVENOUS | Status: DC | PRN

## 2014-06-05 NOTE — Progress Notes (Signed)
Patient will be transferred to 3M02.  Called report to [place] on [place].  Patient appears to be in no immediate distress at this time.  Brita Romp, RN

## 2014-06-05 NOTE — Progress Notes (Signed)
Patient pale, diaphoretic and hypoxic with saturations in low 80's.  Requires face mask with saturation rising to 90%. Heart rate ranging from 60- 130. Patient pale and clammy--reports not feeling well. No complaints of CP.  Temp 98.6   Lungs: bilateral Rhonchi. Heart: Regular but tachycardic. Abdomen: soft and non tender. Decreased BS.  Neuro: Pale with soft voice. Answers appropriately.   A/P 1. Hypoxia question due to PNA v/s PE:  Showing signs of sepsis. Stat ABG, BMET, CBC, Will start IV fluids and antibiotics.  Talked with [facility] about transfer to step down for closer monitoring.

## 2014-06-05 NOTE — IPOC Note (Signed)
Overall Plan of Care [facility]) Patient Details Name: Jeffrey [name] MRN: [id number] DOB: Apr 20, 1940  Admitting Diagnosis: cva  Hospital Problems: Principal Problem:   Sepsis Active Problems:   Essential hypertension   Type 2 diabetes, uncontrolled, with neuropathy   Nontraumatic intracranial hemorrhage   Ileus of unspecified type     Functional Problem List: Nursing Bladder, Endurance, Medication Management, Motor, Nutrition, Perception, Sensory, Safety, Skin Integrity  PT Balance, Sensory, Endurance, Motor, Pain  OT Balance, Cognition, Endurance, Motor, Perception, Safety, Sensory, Vision  SLP Cognition, Linguistic  TR         Basic ADL's: OT Grooming, Bathing, Dressing, Toileting     Advanced  ADL's: OT       Transfers: PT Bed Mobility, Bed to Chair  OT Toilet, Tub/Shower     Locomotion: PT Wheelchair Mobility, Ambulation     Additional Impairments: OT Fuctional Use of Upper Extremity  SLP Communication, Social Cognition expression Problem Solving, Memory, Awareness  TR      Anticipated Outcomes Item Anticipated Outcome  Self Feeding Supervision  Swallowing      Basic self-care  Min assist  Toileting  Min assist   Bathroom Transfers Min assist  Bowel/Bladder  Cont of bowel and bladder  Transfers  Supervision - Min A  Locomotion  Supervision at w/c level  Communication  supervision   Cognition  supervision   Pain  pain less than 2  Safety/Judgment  no falls while on the rehab unit   Therapy Plan: PT Intensity: Minimum of 1-2 x/day ,45 to 90 minutes PT Duration Estimated Length of Stay: 21-28 days OT Intensity: Minimum of 1-2 x/day, 45 to 90 minutes OT Frequency: 5 out of 7 days OT Duration/Estimated Length of Stay: 24-28 days SLP Intensity: Minumum of 1-2 x/day, 30 to 90 minutes SLP Frequency: 3 to 5 out of 7 days SLP Duration/Estimated Length of Stay: 28 days        Team Interventions: Nursing Interventions Patient/Family Education,  Bladder Management, Disease Management/Prevention, Medication Management, Skin Care/Wound Management, Cognitive Remediation/Compensation, Discharge Planning, Psychosocial Support  PT interventions Ambulation/gait training, DME/adaptive equipment instruction, Functional mobility training, Neuromuscular re-education, Training and development officer, Patient/family education, Splinting/orthotics, Therapeutic Activities, UE/LE Strength taining/ROM, UE/LE Coordination activities, Wheelchair propulsion/positioning, Therapeutic Exercise  OT Interventions Balance/vestibular training, Cognitive remediation/compensation, Discharge planning, Disease mangement/prevention, DME/adaptive equipment instruction, Functional mobility training, Neuromuscular re-education, Pain management, Patient/family education, Psychosocial support, Self Care/advanced ADL retraining, Splinting/orthotics, Therapeutic Activities, Therapeutic Exercise, UE/LE Strength taining/ROM, UE/LE Coordination activities, Visual/perceptual remediation/compensation  SLP Interventions Speech/Language facilitation, Environmental controls, Patient/family education, Internal/external aids, Cognitive remediation/compensation, Cueing hierarchy, Functional tasks  TR Interventions    SW/CM Interventions Discharge Planning, Psychosocial Support, Patient/Family Education    Team Discharge Planning: Destination: PT-Home (Home vs. SNF) ,OT- Home (SNF if family unable to provide min assist- supervision) , SLP-Home (vs SNF, will depend on whether family can provide 24/7 supervision ) Projected Follow-up: PT-Home health PT, 24 hour supervision/assistance, Skilled nursing facility, Other (comment) (Home vs. SNF pending pt progress, support at D/C), OT-  Home health OT, Skilled nursing facility, SLP-24 hour supervision/assistance, None, Home Health SLP, Skilled Nursing facility Projected Equipment Needs: PT-To be determined, OT- 3 in 1 bedside comode, Tub/shower bench, SLP-   Equipment Details: PT- , OT-  Patient/family involved in discharge planning: PT- Patient unable/family or caregiver not available,  OT-Patient, SLP-Patient, Family member/caregiver  MD ELOS: N/A pt being transferred due to medical decline Medical Rehab Prognosis:  Guarded Assessment:  The patient has been admitted for CIR therapies with  the diagnosis of  ICH. The team will be addressing functional mobility, strength, stamina, balance, safety, adaptive techniques and equipment, self-care, bowel and bladder mgt, patient and caregiver education, NMR, communication. Goals had been set at min assist to supervision.     Meredith Staggers, MD, FAAPMR     See Team Conference Notes for weekly updates to the plan of care

## 2014-06-05 NOTE — Progress Notes (Signed)
Social Work Patient ID: Jeffrey [name], male   DOB: 10/15/1940, 74 y.o.   MRN: [id number] Insurance contacted regarding pt transferring to a acute unit from rehab. Wife aware also.

## 2014-06-05 NOTE — Progress Notes (Signed)
Inpatient Diabetes Program Recommendations  AACE/ADA: New Consensus Statement on Inpatient Glycemic Control (2013)  Target Ranges:  Prepandial:   less than 140 mg/dL      Peak postprandial:   less than 180 mg/dL (1-2 hours)      Critically ill patients:  140 - 180 mg/dL   Reason for Assessment:  Results for TRAYE, BATES (MRN [id number]) as of 06/05/2014 13:28  Ref. Range 06/04/2014 06:55 06/04/2014 12:00 06/04/2014 16:34 06/04/2014 21:04 06/05/2014 06:39  Glucose-Capillary Latest Range: 70-99 mg/dL 171 (H) 286 (H) 218 (H) 212 (H) 191 (H)   Diabetes history: Type 2 diabetes Outpatient Diabetes medications: Insulin pump-Managed by Dr. Buddy Duty Current orders for Inpatient glycemic control:  Patient has not been on insulin pump during hospitalization.  CBG's increased.  May consider adding low dose basal insulin.  Consider Lantus 15 units daily.   Thanks, Adah Perl, RN, BC-ADM Inpatient Diabetes Coordinator Pager [phone]

## 2014-06-05 NOTE — Progress Notes (Signed)
[facility]     PROGRESS NOTE    Subjective/Complaints: Became short of breath, hypoxic while getting up with techs this morning. Denies chest pain or cough. Just feels short of breath.  Objective: Vital Signs: Blood pressure 114/74, pulse 95, temperature 98 F (36.7 C), temperature source Oral, resp. rate 18, height 5\' 8"  (1.727 m), weight 89.359 kg (197 lb), SpO2 91 %. Dg Chest Port 1 View  06/04/2014   CLINICAL DATA:  Acute onset of shortness of breath. Initial encounter.  EXAM: PORTABLE CHEST - 1 VIEW  COMPARISON:  Chest radiograph performed 05/30/2014  FINDINGS: The lungs are hypoexpanded. Patchy left-sided airspace opacity raises concern for mild pneumonia, though mildly asymmetric interstitial edema might have a similar appearance. No pleural effusion or pneumothorax is seen, though the left costophrenic angle is incompletely imaged on this study.  The cardiomediastinal silhouette is within normal limits. No acute osseous abnormalities are seen.  IMPRESSION: Lungs hypoexpanded. Patchy left-sided airspace opacity raises concern for mild pneumonia, though mildly asymmetric interstitial edema might have a similar appearance.   Electronically Signed   By: Garald Balding M.D.   On: 06/04/2014 01:41    Recent Labs  06/04/14 0602  WBC 15.3*  HGB 15.7  HCT 46.0  PLT 120*    Recent Labs  06/04/14 0602  NA 139  K 3.8  CL 108  GLUCOSE 183*  BUN 20  CREATININE 1.16  CALCIUM 9.6   CBG (last 3)   Recent Labs  06/04/14 1634 06/04/14 2104 06/05/14 0639  GLUCAP 218* 212* 191*    Wt Readings from Last 3 Encounters:  06/04/14 89.359 kg (197 lb)  04/28/14 90.774 kg (200 lb 1.9 oz)  05/27/13 92.08 kg (203 lb)    Physical Exam:  Gen: dyspneic, in distress---NRB mask on Constitutional: He is oriented to person, place, and time. He appears well-developed.  HENT: oral mucosa pink and moist Head: Normocephalic.  Eyes: EOM are normal.   Neck: Normal range of motion. Neck supple. No thyromegaly present.  Cardiovascular: tachy around 130---sounds regular Respiratory: decreased sounds at bases but don't appreciate wheezes, rales, rhonchi GI: Soft. Bowel sounds are normal. He exhibits no distension.  Neurological: He is alert and oriented to person, place, and time.   Has difficulty with focus and sustaining attention. Is aware why he's in the hospital and that he is in rehab. Speech remains dysarthric. Has difficulties consistently initiating movement of the right arm more than the right leg. RUE: 3 to 3+/5 delt,bicep, tricep, HI. RLE 3+ to 4/5 HF, KE ADF/APF but also still inconsistent. Sensation tr/2 RUE and 1+/2 RLE. LUE and LLE display fairly functional movement. Sensation appears intact to light touch and pain in the left arm and leg. Skin: Skin is warm and dry.  Psychiatric: He has a normal mood and affect. His behavior is normal  Assessment/Plan: 1. Functional deficits secondary to left thalamic ICH which require 3+ hours per day of interdisciplinary therapy in a comprehensive inpatient rehab setting. Physiatrist is providing close team supervision and 24 hour management of active medical problems listed below. Physiatrist and rehab team continue to assess barriers to discharge/monitor patient progress toward functional and medical goals. FIM: FIM - Bathing Bathing Steps Patient Completed: Chest, Abdomen, Front perineal area, Right upper leg, Left upper leg Bathing: 3: Mod-Patient completes 5-7 65f 10 parts or 50-74%  FIM - Upper Body Dressing/Undressing Upper body dressing/undressing steps patient completed: Thread/unthread left sleeve of pullover shirt/dress, Pull shirt over  trunk Upper body dressing/undressing: 3: Mod-Patient completed 50-74% of tasks FIM - Lower Body Dressing/Undressing Lower body dressing/undressing: 1: Total-Patient completed less than 25% of tasks        FIM - Financial trader Devices: Arm rests Bed/Chair Transfer: 1: Two helpers  FIM - Locomotion: Wheelchair Locomotion: Wheelchair: 0: Activity did not occur FIM - Locomotion: Ambulation Locomotion: Ambulation: 0: Activity did not occur  Comprehension Comprehension Mode: Auditory Comprehension: 5-Understands basic 90% of the time/requires cueing < 10% of the time  Expression Expression Mode: Verbal Expression: 4-Expresses basic 75 - 89% of the time/requires cueing 10 - 24% of the time. Needs helper to occlude trach/needs to repeat words.  Social Interaction Social Interaction: 5-Interacts appropriately 90% of the time - Needs monitoring or encouragement for participation or interaction.  Problem Solving Problem Solving: 3-Solves basic 50 - 74% of the time/requires cueing 25 - 49% of the time  Memory Memory: 3-Recognizes or recalls 50 - 74% of the time/requires cueing 25 - 49% of the time  1. Functional deficits secondary to left thalamic ICH 2. DVT Prophylaxis/Anticoagulation: SCDs. Monitor for any signs of DVT 3. Pain Management: Tylenol as needed 4. Hypertension. Norvasc 10 mg daily, lisinopril 10 mg daily. Monitor with increased mobility 5. Neuropsych: This patient is capable of making decisions on his own behalf. 6. Skin/Wound Care: Routine skin checks 7. Fluids/Electrolytes/Nutrition: Strict I and O's with follow-up chemistries 8. Diabetes mellitus with peripheral neuropathy. Hemoglobin A1c 7.8. Sliding scale insulin. Patient on Invokana 3 mg daily prior to admission and has an insulin pump followed by Dr. Buddy Duty. Resume as needed. Check blood sugars before meals and at bedtime 9. CAD with balloon angioplasty.   10. Acute hypoxia/tachycardia: ?worsening pneumonia vs PE vs MI  -stat cxr, abg, ekg, labs ordered. On NRB mask currently  -will need to transfer to higher acuity bed for care  -empiric vanc/zosyn to cover potential aspiration pneumonia, early  sepsis  LOS (Days) 2 A FACE TO FACE EVALUATION WAS PERFORMED  Juanell Saffo T 06/05/2014 8:38 AM

## 2014-06-05 NOTE — Progress Notes (Signed)
CRITICAL VALUE ALERT  Critical value received:  Troponin 1.25  Date of notification: 06/05/14  Time of notification:  5537  Critical value read back: yes  Nurse who received alert:  Dulcy Fanny, RN  MD notified (1st page):  Dr. Halford Chessman  Time of first page:  1451  MD notified (2nd page): Dr. Stevenson Clinch  Time of second page: 1515  Responding MD:  Dr. Stevenson Clinch  Time MD responded:  [phone]

## 2014-06-05 NOTE — Progress Notes (Signed)
ANTIBIOTIC CONSULT NOTE - INITIAL  Pharmacy Consult for vancomycin and zosyn Indication: rule out sepsis  Allergies  Allergen Reactions  . Niacin Other (See Comments)    REACTION: intolerant,not allergic="flushing,hot flashes,turning red"    Patient Measurements: Height: 5\' 8"  (172.7 cm) Weight: 197 lb (89.359 kg) IBW/kg (Calculated) : 68.4 Adjusted Body Weight:   Vital Signs: Temp: 98 F (36.7 C) (03/31 0511) Temp Source: Oral (03/31 0511) BP: 114/74 mmHg (03/31 0511) Pulse Rate: 95 (03/31 0511) Intake/Output from previous day: 03/30 0701 - 03/31 0700 In: 480 [P.O.:480] Out: 650 [Urine:650] Intake/Output from this shift:    Labs:  Recent Labs  06/04/14 0602  WBC 15.3*  HGB 15.7  PLT 120*  CREATININE 1.16   Estimated Creatinine Clearance: 63.4 mL/min (by C-G formula based on Cr of 1.16). No results for input(s): VANCOTROUGH, VANCOPEAK, VANCORANDOM, GENTTROUGH, GENTPEAK, GENTRANDOM, TOBRATROUGH, TOBRAPEAK, TOBRARND, AMIKACINPEAK, AMIKACINTROU, AMIKACIN in the last 72 hours.   Microbiology: Recent Results (from the past 720 hour(s))  MRSA PCR Screening     Status: None   Collection Time: 05/30/14 10:13 AM  Result Value Ref Range Status   MRSA by PCR NEGATIVE NEGATIVE Final    Comment:        The GeneXpert MRSA Assay (FDA approved for NASAL specimens only), is one component of a comprehensive MRSA colonization surveillance program. It is not intended to diagnose MRSA infection nor to guide or monitor treatment for MRSA infections.   Clostridium Difficile by PCR     Status: None   Collection Time: 06/02/14 11:00 AM  Result Value Ref Range Status   C difficile by pcr NEGATIVE NEGATIVE Final    Medical History: Past Medical History  Diagnosis Date  . CAD (coronary artery disease)     PCI distal RCA ...2004, residual 70% LAD   /   ...nuclear...03/2007...no ischemia.Marland KitchenMarland Kitchenpreserved LV /  nuclear...03/03/2009...inferior scar..no ischemia..EF 51%  .  Dyslipidemia     takes Atorvastatin daily  . Internal hemorrhoids   . Gout     takes Allopurinol daily and Colchicine as needed  . Seasonal allergic rhinitis   . Right carotid bruit   . Cyst of nasopharynx     per ENT Wilburn Cornelia  . Myocardial infarction 75yrs ago  . Peripheral edema     takes Lasix daily  . HTN (hypertension)     takes Cardura,Metoprolol,[name],and Amlodipine daily  . Arthritis   . GERD (gastroesophageal reflux disease)     takes Omeprazole daily  . History of colon polyps   . Type 2 diabetes, uncontrolled, with neuropathy 1989    takes Invokana daily and has an insulin pump (Dr. [name])  . 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 amLODipine  10 mg Oral Daily  . antiseptic oral rinse  7 mL Mouth Rinse q12n4p  . atorvastatin  40 mg Oral q1800  . chlorhexidine  15 mL Mouth Rinse BID  . insulin aspart  0-9 Units Subcutaneous TID WC  . levofloxacin  500 mg Oral Daily  . lisinopril  10 mg Oral Daily  . pantoprazole  40 mg Oral Daily  . senna-docusate  1 tablet Oral BID   Infusions:   Assessment: 74 yo male with r/o sepsis will be started on vancomycin and zosyn.  CrCl ~63  Goal of Therapy:  Vancomycin trough level 15-20 mcg/ml  Plan:  - zosyn 3.375 g iv q8h (4hr infusion) and vancomycin 750  mg iv q12h - monitor renal function - follow cx when it's available  - check vancomycin trough when it's appropriate.  Jeffrey [name], Jeffrey [name] 06/05/2014,8:41 AM

## 2014-06-05 NOTE — Progress Notes (Signed)
At approximately 0800, patient called me to room stating he didn't feel good.  Upon entering room, patient was sitting in bed, in obvious respiratory distress.  Checked O2 sats 80% on room air with HR in 140's.  Placed patient on oxygen via nasal cannula at 4L and patient only came up to 83%.  Paged RT and Algis Liming, PA with findings.  New orders placed for non-re breather mask EKG, peripheral IV, foley catheter, IVF, labs.  RR RN also called to assist with patient care.  RT drew ABGs.  Patients oxygen levels 92-96% on non-re breather mask with HR still elevated at 130-140's.  Erin Hearing, NP with Internal Medicine came and assessed patient and ordered additional labs and for patient to be transferred off of rehab unit.  Flexiseal inserted for bowel decompression with return gas noted.  Critical care pulmonary MD's came and assessed patient, confirming patients need to transfer to ICU for closer observation and treatment.  Patient is currently resting in bed, IVF and antibiotics running, no obvious distress noted at this time.  Brita Romp, RN

## 2014-06-05 NOTE — Progress Notes (Signed)
CRITICAL VALUE ALERT  Critical value received:  Lactic Acid  Date of notification:  06/05/14  Time of notification:  4742  Critical value read back:Yes.    Nurse who received alert:  Rayetta Pigg, RN  MD notified (1st page):  Algis Liming, PA  Time of first page:  1015  MD notified (2nd page):  Time of second page:  Responding MD:  Algis Liming, PA  Time MD responded:  [phone]

## 2014-06-05 NOTE — Consult Note (Signed)
PULMONARY / CRITICAL CARE MEDICINE   Name: Jeffrey [name] MRN: [id number] DOB: August 29, 1940    ADMISSION DATE:  (Not on file) CONSULTATION DATE:  06/05/2014  REFERRING MD :  Bonney Aid (CIR)  CHIEF COMPLAINT:  SOB  INITIAL PRESENTATION:  74 y.o. M initially brought to [facility] ED 3/25 with right sided weakness and slurred speech due to left sided IPH with IV extension and bilateral SAH.  Discharged to [place] 3/30.  3/31, had respiratory distress so PCCM called to evaluate for possible transfer.    STUDIES:  CT head 3/25 >>> acute left thalamic and left internal capsul IPH c/w hypertensive hemorrhage.  IVH breakthrough, no hydrocephalus. CT CSpine 3/25 >>> no acute fx, multilevel spondylosis, A/P fusion C2-C4. MRI / MRA brain 3/26 >>> left thalamic hemorrhage, increased intraventricular and bilateral SAH. Echo >>> EF 60 - 65%, grade 1 DD. CXR 3/30 >>> patchy left sided airspace opacity concerning for early PNA, mild asymmetric interstitial edema. CXR 3/31 >>> improvement in bibabsilar airspace disease. AXR 3/31 >>> dilated colon, favor ileus rather than obstruction. AP CT 3/31 >>>   SIGNIFICANT EVENTS: 3/25 - admit with left IPH and IVH, bilateral [facility] 3/29 - discharged to [place] 3/31 - resp distress, transferred to ICU   HISTORY OF PRESENT ILLNESS:  Jeffrey [name] is a 74 y.o. M with PMH as otulined below.  He was initially admitted to [facility] 3/25 with right sided weakness and slurred speech.  He was found to have acute left thalamic and left internal capsule parenchymal hemorrhage with intraventricular extension and bilateral SAH.  He was admitted by neurology and was discharged to [facility] 3/29.    On 3/30, pt had mild leukocytosis and developed mild dyspnea with increased malaise.  CXR concerning for left sided infiltrate for which he was started on Levaquin.  On 3/31 pt had worsened with SpO2 in 80's despite 4L O2, tachypnea, tachycardia, diaphoresis and pale appearance.  He was palced on a NRB with  improvement in sats to 90% and HR remained in 130's.  Pt reported SOB, mild abdominal pain, and generalized malaise.  He denied chest pain, wheezing, N/V. PCCM was consulted for transfer to ICU.   PAST MEDICAL HISTORY :   has a past medical history of CAD (coronary artery disease); Dyslipidemia; Internal hemorrhoids; Gout; Seasonal allergic rhinitis; Right carotid bruit; Cyst of nasopharynx; Myocardial infarction (50yrs ago); Peripheral edema; HTN (hypertension); Arthritis; GERD (gastroesophageal reflux disease); History of colon polyps; Type 2 diabetes, uncontrolled, with neuropathy (1989); and Pharyngeal or nasopharyngeal cyst (05/2013).  has past surgical history that includes Cataract extraction (Bilateral, 2010); Elbow surgery (Right, 1997); Balloon angioplasty, artery (1992, 2004); Colonoscopy (04/03/2002); Colonoscopy (07/18/2007); Rotator cuff repair (09/2011); Nasal septum surgery; Colonoscopy (09/26/2012); Tonsillectomy and adenoidectomy; eye lids raised; Cardiac catheterization (2004); and Polypectomy (N/A, 05/27/2013). Prior to Admission medications   Medication Sig Start Date End Date Taking? Authorizing Provider  Canagliflozin (INVOKANA) 300 MG TABS Take 1 tablet (300 mg total) by mouth daily. 01/20/13   [name], MD  fluticasone (FLONASE) 50 MCG/ACT nasal spray Place 2 sprays into both nostrils daily. 04/28/14   Abner Greenspan, MD  Insulin Infusion Pump (H-TRON V100 INSULIN PUMP) MISC Patient uses Novolog in pump.    Historical Provider, MD  meclizine (ANTIVERT) 25 MG tablet Take 1 tablet (25 mg total) by mouth 3 (three) times daily as needed for dizziness (watch for sedation). 04/28/14   Abner Greenspan, MD   Allergies  Allergen Reactions  . Niacin Other (See  Comments)    REACTION: intolerant,not allergic="flushing,hot flashes,turning red"    FAMILY HISTORY:  Family History  Problem Relation Age of Onset  . Alcohol abuse Father   . Coronary artery disease Neg Hx   . Stroke Neg  Hx   . Cancer Neg Hx   . Diabetes Neg Hx   . Colon cancer Neg Hx     SOCIAL HISTORY:  reports that he has quit smoking. He has never used smokeless tobacco. He reports that he does not drink alcohol or use illicit drugs.  REVIEW OF SYSTEMS:   All negative; except for those that are bolded, which indicate positives.  Constitutional: weight loss, weight gain, night sweats, fevers, chills, fatigue, weakness.  HEENT: headaches, sore throat, sneezing, nasal congestion, post nasal drip, difficulty swallowing, tooth/dental problems, visual complaints, visual changes, ear aches. Neuro: difficulty with speech, right sided weakness, numbness, ataxia. CV:  chest pain, orthopnea, PND, swelling in lower extremities, dizziness, palpitations, syncope.  Resp: cough, hemoptysis, dyspnea, wheezing. GI  heartburn, indigestion, mild abdominal pain, nausea, vomiting, diarrhea, constipation, change in bowel habits, loss of appetite, hematemesis, melena, hematochezia.  GU: dysuria, change in color of urine, urgency or frequency, flank pain, hematuria. MSK: joint pain or swelling, decreased range of motion. Psych: change in mood or affect, depression, anxiety, suicidal ideations, homicidal ideations. Skin: rash, itching, bruising.   SUBJECTIVE: Mild diffuse abd pain. Dyspnea which has improved somewhat since earlier this AM.  Febrile to 101.  VITAL SIGNS: Temp:  [98 F (36.7 C)-100.1 F (37.8 C)] 100.1 F (37.8 C) (03/31 0922) Pulse Rate:  [90-143] 111 (03/31 1025) Resp:  [16-46] 28 (03/31 0955) BP: (81-114)/(58-82) 99/64 mmHg (03/31 1025) SpO2:  [80 %-97 %] 96 % (03/31 1025) FiO2 (%):  [93 %-100 %] 93 % (03/31 0840) HEMODYNAMICS:   VENTILATOR SETTINGS: Vent Mode:  [-]  FiO2 (%):  [93 %-100 %] 93 % INTAKE / OUTPUT: Intake/Output    None     PHYSICAL EXAMINATION: General: Adult male, appears older than stated age, in NAD. Neuro: A&O x 3, right sided weakness. HEENT: [place]/AT. PERRL, sclerae  anicteric. Cardiovascular: Tachy, regular, no M/R/G.  Lungs: Respirations even and unlabored.  CTA bilaterally, No W/R/R. Abdomen: BS absent.  Generalized distention but abd soft, mildly tender throughout with no rebound or guarding. Musculoskeletal: No gross deformities, no edema.  Skin: Intact, warm, no rashes.  LABS:  CBC  Recent Labs Lab 05/31/14 1515 06/04/14 0602 06/05/14 0818  WBC 10.1 15.3* 26.1*  HGB 14.8 15.7 16.7  HCT 44.7 46.0 49.4  PLT 143* 120* 137*   Coag's  Recent Labs Lab 05/30/14 0714  APTT 27  INR 1.16   BMET  Recent Labs Lab 05/31/14 1515 06/04/14 0602 06/05/14 0818  NA 146* 139 140  K 3.5 3.8 3.7  CL 115* 108 104  CO2 21 19 19   BUN 23 20 29*  CREATININE 1.02 1.16 1.56*  GLUCOSE 129* 183* 286*   Electrolytes  Recent Labs Lab 05/31/14 1515 06/04/14 0602 06/05/14 0818  CALCIUM 9.2 9.6 9.9   Sepsis Markers  Recent Labs Lab 06/05/14 0908  LATICACIDVEN 3.3*   ABG  Recent Labs Lab 06/05/14 0829  PHART 7.368  PCO2ART 25.2*  PO2ART 67.6*   Liver Enzymes  Recent Labs Lab 05/30/14 0714 06/04/14 0602  AST 74* 19  ALT 35 32  ALKPHOS 95 96  BILITOT 1.0 2.1*  ALBUMIN 3.8 3.2*   Cardiac Enzymes No results for input(s): TROPONINI, PROBNP in the last 168 hours.  Glucose  Recent Labs Lab 06/03/14 2127 06/04/14 0655 06/04/14 1200 06/04/14 1634 06/04/14 2104 06/05/14 0639  GLUCAP 169* 171* 286* 218* 212* 191*    Imaging Dg Chest Port 1 View  06/04/2014   CLINICAL DATA:  Acute onset of shortness of breath. Initial encounter.  EXAM: PORTABLE CHEST - 1 VIEW  COMPARISON:  Chest radiograph performed 05/30/2014  FINDINGS: The lungs are hypoexpanded. Patchy left-sided airspace opacity raises concern for mild pneumonia, though mildly asymmetric interstitial edema might have a similar appearance. No pleural effusion or pneumothorax is seen, though the left costophrenic angle is incompletely imaged on this study.  The  cardiomediastinal silhouette is within normal limits. No acute osseous abnormalities are seen.  IMPRESSION: Lungs hypoexpanded. Patchy left-sided airspace opacity raises concern for mild pneumonia, though mildly asymmetric interstitial edema might have a similar appearance.   Electronically Signed   By: Garald Balding M.D.   On: 06/04/2014 01:41    ASSESSMENT / PLAN:  NEUROLOGIC A:   Acute left sided IPH with intraventricular extension and bilateral SAH ETOH use P:   No anticoags / antiplatelets. Repeat CT in 1 month per neuro recs. Long term BP goals < 130/90 per neuro recs. Continue PT / OT efforts as able. Continue thiamine / folate. D/c Morphine.  PULMONARY A: Acute hypoxic respiratory failure Doubt HCAP - CXR unimpressive Atelectasis Tobacco use disorder P:   Continue supplemental O2 as needed to maintain SpO2 > 92%. Continue empiric abx per ID section for now. Follow PCT, lactate, cultures. Pulmonary hygiene. CXR in AM. Tobacco cessation counseling.  CARDIOVASCULAR A:  Hypotension with concern for developing sepsis Troponin leak - likely due to demand ischemia Hx CAD, HTN, HLD, right carotid bruit P:  IVF resuscitation. Goal MAP > 65. Trend troponin. Repeat lactate. Hydralazine PRN for SBP > 160 (given recent IPH / SAH due to hypertension). Norvasc and Lisinopril d/c'd (Note, long term BP goals < 130/90 per neuro recs).  RENAL A:   AKI AGMA - lactate P:   NS @ 100. D/c NS with 76mEq K. BMP in AM.  GASTROINTESTINAL A:   Ileus - abd distention improved after flexi seal inserted while on CIR GERD Nutrition P:   Will obtain CT abd / pelvis given sudden deterioration along with abd distention and abd pain. NPO.  HEMATOLOGIC A:   Mild thrombocytopenia VTE Prophylaxis P:  Monitor platelets. SCD's only. CBC in AM.  INFECTIOUS A:   Concern for sepsis of unclear etiology - Doubt PNA as CXR unimpressive.  UA negative on admit, will repeat.  ? abd  source P:   BCx2 3/31 > UCx 3/31 > Sputum Cx 3/31 > Abx: Vanc, start date 3/31, day 1/x. Abx: Zosyn, start date 3/31, day 1/x. PCT algorithm to limit abx exposure.  ENDOCRINE A:   DM   P:   CBG's q4hr. SSI. Invokana d/c'd.   Family updated: Wife.  Interdisciplinary Family Meeting v Palliative Care Meeting:  Due by: 4/6.   Montey Hora, [place] [facility] Pager: [phone]  or [phone] 06/05/2014, 10:44 AM

## 2014-06-05 NOTE — H&P (Signed)
PULMONARY / CRITICAL CARE MEDICINE   Name: Jeffrey [name] MRN: [id number] DOB: 08/14/1940    ADMISSION DATE:  06/05/2014   REFERRING MD :  Bonney Aid (CIR)  CHIEF COMPLAINT:  SOB  INITIAL PRESENTATION:  74 y.o. M initially brought to [facility] ED 3/25 with right sided weakness and slurred speech due to left sided IPH with IV extension and bilateral SAH.  Discharged to [place] 3/30.  3/31, had respiratory distress so PCCM called to evaluate for possible transfer.    STUDIES:  CT head 3/25 >>> acute left thalamic and left internal capsul IPH c/w hypertensive hemorrhage.  IVH breakthrough, no hydrocephalus. CT CSpine 3/25 >>> no acute fx, multilevel spondylosis, A/P fusion C2-C4. MRI / MRA brain 3/26 >>> left thalamic hemorrhage, increased intraventricular and bilateral SAH. Echo >>> EF 60 - 65%, grade 1 DD. CXR 3/30 >>> patchy left sided airspace opacity concerning for early PNA, mild asymmetric interstitial edema. CXR 3/31 >>> improvement in bibabsilar airspace disease. AXR 3/31 >>> dilated colon, favor ileus rather than obstruction. AP CT 3/31 >>>   SIGNIFICANT EVENTS: 3/25 - admit with left IPH and IVH, bilateral [facility] 3/29 - discharged to [place] 3/31 - resp distress, transferred to ICU  HISTORY OF PRESENT ILLNESS:  Jeffrey [name] is a 74 y.o. M with PMH as otulined below.  He was initially admitted to [facility] 3/25 with right sided weakness and slurred speech.  He was found to have acute left thalamic and left internal capsule parenchymal hemorrhage with intraventricular extension and bilateral SAH.  He was admitted by neurology and was discharged to [facility] 3/29.    On 3/30, pt had mild leukocytosis and developed mild dyspnea with increased malaise.  CXR concerning for left sided infiltrate for which he was started on Levaquin.  On 3/31 pt had worsened with SpO2 in 80's despite 4L O2, tachypnea, tachycardia, diaphoresis and pale appearance.  He was palced on a NRB with improvement in sats to 90% and HR  remained in 130's.  Pt reported SOB, mild abdominal pain, and generalized malaise.  He denied chest pain, wheezing, N/V. PCCM was consulted for transfer to ICU.  PAST MEDICAL HISTORY :   has a past medical history of CAD (coronary artery disease); Dyslipidemia; Internal hemorrhoids; Gout; Seasonal allergic rhinitis; Right carotid bruit; Cyst of nasopharynx; Myocardial infarction (27yrs ago); Peripheral edema; HTN (hypertension); Arthritis; GERD (gastroesophageal reflux disease); History of colon polyps; Type 2 diabetes, uncontrolled, with neuropathy (1989); and Pharyngeal or nasopharyngeal cyst (05/2013).  has past surgical history that includes Cataract extraction (Bilateral, 2010); Elbow surgery (Right, 1997); Balloon angioplasty, artery (1992, 2004); Colonoscopy (04/03/2002); Colonoscopy (07/18/2007); Rotator cuff repair (09/2011); Nasal septum surgery; Colonoscopy (09/26/2012); Tonsillectomy and adenoidectomy; eye lids raised; Cardiac catheterization (2004); and Polypectomy (N/A, 05/27/2013). Prior to Admission medications   Medication Sig Start Date End Date Taking? Authorizing Provider  Canagliflozin (INVOKANA) 300 MG TABS Take 1 tablet (300 mg total) by mouth daily. 01/20/13   [name], MD  fluticasone (FLONASE) 50 MCG/ACT nasal spray Place 2 sprays into both nostrils daily. 04/28/14   Abner Greenspan, MD  Insulin Infusion Pump (H-TRON V100 INSULIN PUMP) MISC Patient uses Novolog in pump.    Historical Provider, MD  meclizine (ANTIVERT) 25 MG tablet Take 1 tablet (25 mg total) by mouth 3 (three) times daily as needed for dizziness (watch for sedation). 04/28/14   Abner Greenspan, MD   Allergies  Allergen Reactions  . Niacin Other (See Comments)    REACTION: intolerant,not allergic="flushing,hot  flashes,turning red"    FAMILY HISTORY:  Family History  Problem Relation Age of Onset  . Alcohol abuse Father   . Coronary artery disease Neg Hx   . Stroke Neg Hx   . Cancer Neg Hx   . Diabetes  Neg Hx   . Colon cancer Neg Hx     SOCIAL HISTORY:  reports that he has quit smoking. He has never used smokeless tobacco. He reports that he does not drink alcohol or use illicit drugs.  REVIEW OF SYSTEMS:   All negative; except for those that are bolded, which indicate positives.  Constitutional: weight loss, weight gain, night sweats, fevers, chills, fatigue, weakness.  HEENT: headaches, sore throat, sneezing, nasal congestion, post nasal drip, difficulty swallowing, tooth/dental problems, visual complaints, visual changes, ear aches. Neuro: difficulty with speech, right sided weakness, numbness, ataxia. CV:  chest pain, orthopnea, PND, swelling in lower extremities, dizziness, palpitations, syncope.  Resp: cough, hemoptysis, dyspnea, wheezing. GI  heartburn, indigestion, mild abdominal pain, nausea, vomiting, diarrhea, constipation, change in bowel habits, loss of appetite, hematemesis, melena, hematochezia.  GU: dysuria, change in color of urine, urgency or frequency, flank pain, hematuria. MSK: joint pain or swelling, decreased range of motion. Psych: change in mood or affect, depression, anxiety, suicidal ideations, homicidal ideations. Skin: rash, itching, bruising.   SUBJECTIVE: Mild diffuse abd pain. Dyspnea which has improved somewhat since earlier this AM.  Febrile to 101.  VITAL SIGNS: Temp:  [98 F (36.7 C)-100.1 F (37.8 C)] 100.1 F (37.8 C) (03/31 0922) Pulse Rate:  [89-143] 89 (03/31 1105) Resp:  [16-46] 26 (03/31 1105) BP: (81-130)/(58-82) 130/72 mmHg (03/31 1105) SpO2:  [80 %-100 %] 100 % (03/31 1105) FiO2 (%):  [93 %-100 %] 93 % (03/31 0840) HEMODYNAMICS:   VENTILATOR SETTINGS: Vent Mode:  [-]  FiO2 (%):  [93 %-100 %] 93 % INTAKE / OUTPUT: Intake/Output    None     PHYSICAL EXAMINATION: General: Adult male, appears older than stated age, in NAD. Neuro: A&O x 3, right sided weakness. HEENT: [place]/AT. PERRL, sclerae anicteric. Cardiovascular: Tachy,  regular, no M/R/G.  Lungs: Respirations even and unlabored.  CTA bilaterally, No W/R/R. Abdomen: BS absent.  Generalized distention but abd soft, mildly tender throughout with no rebound or guarding. Musculoskeletal: No gross deformities, no edema.  Skin: Intact, warm, no rashes.  LABS:  CBC  Recent Labs Lab 05/31/14 1515 06/04/14 0602 06/05/14 0818  WBC 10.1 15.3* 26.1*  HGB 14.8 15.7 16.7  HCT 44.7 46.0 49.4  PLT 143* 120* 137*   Coag's  Recent Labs Lab 05/30/14 0714  APTT 27  INR 1.16   BMET  Recent Labs Lab 05/31/14 1515 06/04/14 0602 06/05/14 0818  NA 146* 139 140  K 3.5 3.8 3.7  CL 115* 108 104  CO2 21 19 19   BUN 23 20 29*  CREATININE 1.02 1.16 1.56*  GLUCOSE 129* 183* 286*   Electrolytes  Recent Labs Lab 05/31/14 1515 06/04/14 0602 06/05/14 0818  CALCIUM 9.2 9.6 9.9   Sepsis Markers  Recent Labs Lab 06/05/14 0908  LATICACIDVEN 3.3*  PROCALCITON 0.28   ABG  Recent Labs Lab 06/05/14 0829  PHART 7.368  PCO2ART 25.2*  PO2ART 67.6*   Liver Enzymes  Recent Labs Lab 05/30/14 0714 06/04/14 0602  AST 74* 19  ALT 35 32  ALKPHOS 95 96  BILITOT 1.0 2.1*  ALBUMIN 3.8 3.2*   Cardiac Enzymes  Recent Labs Lab 06/05/14 0908  TROPONINI 0.23*   Glucose  Recent Labs  Lab 06/03/14 2127 06/04/14 0655 06/04/14 1200 06/04/14 1634 06/04/14 2104 06/05/14 0639  GLUCAP 169* 171* 286* 218* 212* 191*    Imaging Dg Chest Port 1 View  06/04/2014   CLINICAL DATA:  Acute onset of shortness of breath. Initial encounter.  EXAM: PORTABLE CHEST - 1 VIEW  COMPARISON:  Chest radiograph performed 05/30/2014  FINDINGS: The lungs are hypoexpanded. Patchy left-sided airspace opacity raises concern for mild pneumonia, though mildly asymmetric interstitial edema might have a similar appearance. No pleural effusion or pneumothorax is seen, though the left costophrenic angle is incompletely imaged on this study.  The cardiomediastinal silhouette is within  normal limits. No acute osseous abnormalities are seen.  IMPRESSION: Lungs hypoexpanded. Patchy left-sided airspace opacity raises concern for mild pneumonia, though mildly asymmetric interstitial edema might have a similar appearance.   Electronically Signed   By: Garald Balding M.D.   On: 06/04/2014 01:41    ASSESSMENT / PLAN:  NEUROLOGIC A:   Acute left sided IPH with intraventricular extension and bilateral SAH ETOH use P:   No anticoags / antiplatelets. Repeat CT in 1 month per neuro recs. Long term BP goals < 130/90 per neuro recs. Continue PT / OT efforts as able. Continue thiamine / folate. D/c Morphine.  PULMONARY A: Acute hypoxic respiratory failure Doubt HCAP - CXR unimpressive Atelectasis Tobacco use disorder P:   Continue supplemental O2 as needed to maintain SpO2 > 92%. Continue empiric abx per ID section for now. Follow PCT, lactate, cultures. Pulmonary hygiene. CXR in AM. Tobacco cessation counseling.  CARDIOVASCULAR A:  Hypotension with concern for developing sepsis Troponin leak - likely due to demand ischemia Hx CAD, HTN, HLD, right carotid bruit P:  IVF resuscitation. Goal MAP > 65. Trend troponin. Repeat lactate. Hydralazine PRN for SBP > 160 (given recent IPH / SAH due to hypertension). Norvasc and Lisinopril d/c'd (Note, long term BP goals < 130/90 per neuro recs).  RENAL A:   AKI AGMA - lactate P:   NS @ 100. D/c NS with 66mEq K. BMP in AM.  GASTROINTESTINAL A:   Ileus - abd distention improved after flexi seal inserted while on CIR GERD Nutrition P:   Will obtain CT abd / pelvis given sudden deterioration along with abd distention and abd pain. NPO.  HEMATOLOGIC A:   Mild thrombocytopenia VTE Prophylaxis P:  Monitor platelets. SCD's only. CBC in AM.  INFECTIOUS A:   Concern for sepsis of unclear etiology - Doubt PNA as CXR unimpressive.  UA negative on admit, will repeat.  ? abd source P:   BCx2 3/31 > UCx 3/31  > Sputum Cx 3/31 > Abx: Vanc, start date 3/31, day 1/x. Abx: Zosyn, start date 3/31, day 1/x. PCT algorithm to limit abx exposure.  ENDOCRINE A:   DM   P:   CBG's q4hr. SSI. Invokana d/c'd.   Family updated: Wife.  Interdisciplinary Family Meeting v Palliative Care Meeting:  Due by: 4/6.   Montey Hora, [place] [facility] Pager: [phone]  or [phone] 06/05/2014, 12:06 PM  Reviewed above, examined.  74 yo male was in [facility] for [place] and SAH.  He was transferred to CIR.  He developed progressive respiratory distress.  CXR showed faint infiltrate and he was started on levaquin for possible PNA.  He also had abdominal distention.  He has progressive tachypnea and low blood pressure.  He had rectal tube inserted with decreased abdominal discomfort.  He  is alert, increased RR, ashen color, heart sounds regular and tachy, no wheeze, abd soft but distended, no edema.  Will transfer to [facility] ICU.  Continue IV fluids, and change to vancomycin/zosyn.  F/u Lactic acid and procalcitonin.  Will get CT abd/pelvis.  Check ECG, cardiac enzymes.  CC time by me independent of APP time is 40 minutes.  Chesley Mires, MD [facility] Pulmonary/Critical Care 06/05/2014, 12:10 PM Pager:  [phone] After 3pm call: [phone]

## 2014-06-05 NOTE — Progress Notes (Signed)
CRITICAL VALUE ALERT  Critical value received:  Lactic acid 2.4  Date of notification: 06/05/14  Time of notification:  1430  Critical value read back: yes  Nurse who received alert: Dulcy Fanny, RN  MD notified (1st page):  Montey Hora, PA  Time of first page:  1428  MD notified (2nd page):  Time of second page:  Responding MD:  Montey Hora, PA  Time MD responded:  1430

## 2014-06-05 NOTE — Progress Notes (Signed)
Occupational Therapy Note  Patient Details  Name: Jeffrey [name] MRN: [id number] Date of Birth: Jun 30, 1940  Patient missed 60 minutes of skilled occupational therapy secondary to medical instability, per conversation with PA Pam and RN Loree Fee. Patient planned to be transferred off rehab unit this AM.  Ellwood Dense, [facility] 06/05/2014, 10:56 AM

## 2014-06-05 NOTE — Progress Notes (Signed)
[place] Progress Note Patient Name: EDER MACEK DOB: 01/04/1941 MRN: [id number]   Date of Service  06/05/2014  HPI/Events of Note  Troponin trending up  eICU Interventions  Most likely demand ischemia. Patient with recent (05/30/14) cerebral hemorrhage. - not a candidate for anticoagulation. Continue to trend troponin.      Intervention Category Intermediate Interventions: Other:  Izel Hochberg 06/05/2014, 3:23 PM

## 2014-06-05 NOTE — Significant Event (Signed)
Rapid Response Event Note  Overview:  Called to assist with patient with decreased O2 sats and hypotension Time Called: 0836 Arrival Time: 2060 Event Type: Hypotension, Respiratory  Initial Focused Assessment:  On arrival supine in bed - face flushed - arouses to name - follows commands - oriented - skin cool and moist - on NRB mask - resps rapid - mildly labored with some abdominal breathing - speaking full sentence - O2 sats 93%.  Bil BS with few crackles LLL - otherwise clear.  Right PIV infiltrated.  Foley just placed - clear dark urine.  Abd large moderate diffuse tenderness - soft.  BP now 90/58 with ST 124 RR 42 on NRB mask.  Staff reports low BP 80/42 with HR 140.  Erin Hearing NP with TR[facility] 1 present.  L:Abs reviewed - WBC increased 26 from 15.   Interventions:  Stat IV restart #20 angio to left hand x one stick - NS wide open infusion.  Stat ABG,12 lead EKG, labs, and PCXR and abd film done.  0850:  97/63 HR 120 RR 38 96% O2 sats.  Rectal temp 100.1.  Continued IV fluids for 500 cc bolus per order.  0900:  100/64 HR 117 RR 32.   0915:  104/58 32 RR O2 sats 97% NRB.  HR 114.  Awaiting blood cx draw to start antiobiotics.  Rectal tube placed by 7M staff for colonic distension per abd film - per order NP Lissa Merlin.  Patient tol well - staff reports large amount air return with insertion.  Patient resting comfortably. 0-930:  114/63 HR 114 RR 36. NS now at 100 cc/hr post bolus per order NP Lissa Merlin.  0955:  99/60 HR 117  RR 28 O2 sats 97%.  1000:  Blood cultures drawn.  LA 3.3.  BP remains soft.  99/63 HR 110 R 26 O2 sat 96%  NRB.  Had PCCM consult  To ICU per their order.  Handoff to [employer].  To 3M01.     Event Summary: Name of Physician Notified: Dr. Jefm Petty PA at  (pta RRT)  Name of Consulting Physician Notified: PCCM  Dr. Halford Chessman  per Erin Hearing NP at  (pta RRT)  Outcome: Transferred (Comment) (60m 01)  Event End Time: [address]

## 2014-06-05 NOTE — Progress Notes (Signed)
Physical Therapy Note  Patient Details  Name: Jeffrey [name] MRN: [id number] Date of Birth: [birth year] Today's Date: 06/05/2014  Patient missing 60 minutes of skilled physical therapy secondary to medical instability, per conversation with PA Pam. Patient planned to be transferred off rehab unit this AM.   Stefano Gaul 06/05/2014, 8:42 AM

## 2014-06-05 NOTE — Progress Notes (Signed)
RT called to pt room by RN for decreased spo2 and increased HR. Pt placed on NRB and encouraged to take slow deep breaths.  Pt SPO2 92% on NRB. PA at bedside. ABG obtained. RT will continue to monitor.

## 2014-06-05 NOTE — H&P (Signed)
[facility] History and Physical                                                                                    Jeffrey [name], is a 74 y.o. male  MRN: [id number]   DOB - [birth year]  Admit Date - 06/05/14  Outpatient Primary MD for the patient is [name], MD  With History of -  Past Medical History  Diagnosis Date  . CAD (coronary artery disease)     PCI distal RCA ...2004, residual 70% LAD   /   ...nuclear...03/2007...no ischemia.Marland KitchenMarland Kitchenpreserved LV /  nuclear...03/03/2009...inferior scar..no ischemia..EF 51%  . Dyslipidemia     takes Atorvastatin daily  . Internal hemorrhoids   . Gout     takes Allopurinol daily and Colchicine as needed  . Seasonal allergic rhinitis   . Right carotid bruit   . Cyst of nasopharynx     per ENT Wilburn Cornelia  . Myocardial infarction 77yrs ago  . Peripheral edema     takes Lasix daily  . HTN (hypertension)     takes Cardura,Metoprolol,[name],and Amlodipine daily  . Arthritis   . GERD (gastroesophageal reflux disease)     takes Omeprazole daily  . History of colon polyps   . Type 2 diabetes, uncontrolled, with neuropathy 1989    takes Invokana daily and has an insulin pump (Dr. [name])  . 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 Cataract extraction Bilateral 2010  . Elbow surgery Right 1997  . Balloon angioplasty, artery  1992, 2004    CAD, Dr. Ron Parker  . Colonoscopy  04/03/2002    adenomatous polyp, int hemorrhoids  . Colonoscopy  07/18/2007    normal (Dr. Fuller Plan)  . Rotator cuff repair  09/2011    left, with subacromial decompression  . Nasal septum surgery    . Colonoscopy  09/26/2012    tubular adenoma, sm int hem, rpt 5 yrs Fuller Plan)  . Tonsillectomy and adenoidectomy    . Eye lids raised    . Cardiac catheterization  2004  . Polypectomy N/A 05/27/2013    Procedure: ENDOSCOPIC NASOPHARYNGEAL MASS;  Surgeon: [name], MD    HPI 74 year old male patient with history of hypertension, diabetes, peripheral neuropathy, coronary artery disease status post intervention 2004. Patient initially was admitted to the acute care portion of the hospital on 3/25 with right-sided weakness and slurred speech. Evaluation revealed acute left thalamic and left internal capsule parenchymal hemorrhage compatible with hypertensive hemorrhage. Patient had been admitted by the neurology service and was deemed appropriate to be discharged to CIR on 3/29. Of note additional workup during that hospitalization: Carotid Dopplers with no ICA stenosis, echocardiogram with EF 65% and grade 1 diastolic dysfunction. Prior to admission to rehabilitation patient had several loose stools with negative C. difficile on 3/28.  On 3/30 patient had developed low-grade leukocytosis without fever. Some minor difficulty breathing and increased malaise. A chest x-ray was checked which was concerning for possible left pneumonia as the patient was started on empiric Levaquin. By the morning of 3/31 during  rounds patient was found to be pale, diaphoretic, tachypnea can tachycardic with hypoxia saturations in the low 80s on 4 L nasal cannula. His placed on percent nonrebreather with O2 saturations seemed to greater than 90%. Tachycardia with rates up to the 130s. Patient reports difficulty breathing and generalized malaise. Denied chest pain, does report some mild diffuse abdominal pain, reports anorexia. Denied issues with recent acute sharp chest pains, no recent emesis, last bowel movement several days ago prior to admission to rehabilitation. We have been asked to evaluate the patient for possible sepsis.   Review of Systems   In addition to the HPI above,  No Fever-chills No Headache, changes with Vision or hearing, new focal weakness, tingling, numbness in any extremity since admission 3/25 No problems swallowing food or Liquids,  indigestion/reflux No Chest pain, Cough or palpitations No  N/V; no melena or hematochezia, no dark tarry stools No dysuria, hematuria or flank pain No new skin rashes, lesions, masses or bruises, No new joints pains-aches No recent weight gain or loss No polyuria, polydypsia or polyphagia,  *A full 10 point Review of Systems was done, except as stated above, all other Review of Systems were negative.  Social History History  Substance Use Topics  . Smoking status: Former Research scientist (life sciences)  . Smokeless tobacco: Never Used     Comment: quit smoking 45 years ago.  . Alcohol Use: No    Family History Family History  Problem Relation Age of Onset  . Alcohol abuse Father   . Coronary artery disease Neg Hx   . Stroke Neg Hx   . Cancer Neg Hx   . Diabetes Neg Hx   . Colon cancer Neg Hx     Prior to Admission medications   Medication Sig Start Date End Date Taking? Authorizing Provider  Canagliflozin (INVOKANA) 300 MG TABS Take 1 tablet (300 mg total) by mouth daily. 01/20/13   [name], MD  fluticasone (FLONASE) 50 MCG/ACT nasal spray Place 2 sprays into both nostrils daily. 04/28/14   Abner Greenspan, MD  Insulin Infusion Pump (H-TRON V100 INSULIN PUMP) MISC Patient uses Novolog in pump.    Historical Provider, MD  meclizine (ANTIVERT) 25 MG tablet Take 1 tablet (25 mg total) by mouth 3 (three) times daily as needed for dizziness (watch for sedation). 04/28/14   Abner Greenspan, MD    Allergies  Allergen Reactions  . Niacin Other (See Comments)    REACTION: intolerant,not allergic="flushing,hot flashes,turning red"    Physical Exam  Vitals  There were no vitals taken for this visit.   General:  In moderate acute respiratory distress which has improved with application of high percentage oxygen, appears ill  Psych:  Flat affect, oriented times name, speech slightly slurred but this is unchanged since admission  Neuro:   Table right side upper and lower stroma to weakness not  thoroughly tested given patient's acute respiratory symptoms, CN II through XII intact,  Sensation intact all 4 extremities.  ENT:  Ears and Eyes appear Normal, Conjunctivae clear, PER. Moist oral mucosa without erythema or exudates.  Neck:  Supple, No lymphadenopathy appreciated  Respiratory:  Symmetrical chest wall movement, Good air movement bilaterally, diffuse expiratory crackles posteriorly somewhat diminished left mid field. I do percent nonrebreather  Cardiac:  RRR, occasionally tachycardic No Murmurs, trace right LE edema noted, no JVD, No carotid bruits, peripheral pulses palpable at 2+  Abdomen:  Diminished to absent bowel sounds, Soft, minimally tender lateral upper quadrants without guarding or rebounding,distended,  No masses appreciated, no obvious hepatosplenomegaly  Skin:  No Cyanosis is quite pale in appearance, Normal Skin Turgor, No Skin Rash or Bruise. Previous diaphoresis observed earlier this morning has resolved  Extremities: Symmetrical without obvious trauma or injury,  no effusions.  Data Review  CBC  Recent Labs Lab 05/30/14 0714 05/30/14 0731 05/31/14 1515 06/04/14 0602 06/05/14 0818  WBC 11.8*  --  10.1 15.3* 26.1*  HGB 15.0 15.0 14.8 15.7 16.7  HCT 42.8 44.0 44.7 46.0 49.4  PLT 142*  --  143* 120* 137*  MCV 89.0  --  92.5 90.2 92.0  MCH 31.2  --  30.6 30.8 31.1  MCHC 35.0  --  33.1 34.1 33.8  RDW 14.0  --  14.4 13.4 14.1  LYMPHSABS 0.4*  --  1.6 1.0  --   MONOABS 0.8  --  0.8 1.2*  --   EOSABS 0.0  --  0.0 0.0  --   BASOSABS 0.0  --  0.0 0.0  --     Chemistries   Recent Labs Lab 05/30/14 0714 05/30/14 0731 05/31/14 1515 06/04/14 0602  NA 144 145 146* 139  K 3.3* 3.4* 3.5 3.8  CL 110 110 115* 108  CO2 20  --  21 19  GLUCOSE 140* 139* 129* 183*  BUN 20 22 23 20   CREATININE 1.09 0.80 1.02 1.16  CALCIUM 9.5  --  9.2 9.6  AST 74*  --   --  19  ALT 35  --   --  32  ALKPHOS 95  --   --  96  BILITOT 1.0  --   --  2.1*    estimated  creatinine clearance is 63.4 mL/min (by C-G formula based on Cr of 1.16).  No results for input(s): TSH, T4TOTAL, T3FREE, THYROIDAB in the last 72 hours.  Invalid input(s): FREET3  Coagulation profile  Recent Labs Lab 05/30/14 0714  INR 1.16    No results for input(s): DDIMER in the last 72 hours.  Cardiac Enzymes No results for input(s): CKMB, TROPONINI, MYOGLOBIN in the last 168 hours.  Invalid input(s): CK  Invalid input(s): POCBNP  Urinalysis    Component Value Date/Time   COLORURINE YELLOW 05/30/2014 1904   APPEARANCEUR CLEAR 05/30/2014 1904   LABSPEC 1.020 05/30/2014 1904   PHURINE 5.5 05/30/2014 1904   GLUCOSEU >1000* 05/30/2014 1904   HGBUR LARGE* 05/30/2014 1904   BILIRUBINUR NEGATIVE 05/30/2014 1904   KETONESUR 40* 05/30/2014 1904   PROTEINUR NEGATIVE 05/30/2014 1904   UROBILINOGEN 0.2 05/30/2014 1904   NITRITE NEGATIVE 05/30/2014 1904   LEUKOCYTESUR NEGATIVE 05/30/2014 1904    Imaging results:   Ct Head (brain) Wo Contrast  05/30/2014   CLINICAL DATA:  Patient found down.  Possible stroke.  Code stroke.  EXAM: CT HEAD WITHOUT CONTRAST  TECHNIQUE: Contiguous axial images were obtained from the base of the skull through the vertex without intravenous contrast.  COMPARISON:  05/14/2013.  FINDINGS: There is an acute LEFT thalamic hemorrhage extending to the LEFT internal capsule and there is intraventricular break through, with blood in both lateral ventricles, greater on the LEFT than RIGHT. No hydrocephalus. The parenchymal portion of the hemorrhage measures 26 mm transverse by 13 mm AP. This is present on 6 contiguous images with estimated 30 mm craniocaudal dimension. Tiny amount of blood is also present in the third ventricle and fourth ventricle.  Bilateral lens extractions are present. Chronic dystrophic calcification is present in the RIGHT cerebellar hemisphere, unchanged compared to 05/14/2013.  Gray-white differentiation is preserved. Calvarium intact.  Paranasal sinuses and mastoid air cells normal. Mild motion artifact is present, most evident on the bone windows. Scout images appear within normal limits.  IMPRESSION: 1. Acute LEFT thalamic and LEFT internal capsule parenchymal hemorrhage compatible with hypertensive hemorrhage. Intraventricular break through, with blood in both lateral ventricles, third and fourth ventricles. 2. No hydrocephalus. 3. Critical Value/emergent results were called by telephone at the time of interpretation on 05/30/2014 at 7:33 am to Dr. Nicole Kindred, who verbally acknowledged these results.   Electronically Signed   By: Dereck Ligas M.D.   On: 05/30/2014 07:34   Ct Cervical Spine Wo Contrast  05/30/2014   CLINICAL DATA:  Code stroke. Left alignment hemorrhage. Found down.  EXAM: CT CERVICAL SPINE WITHOUT CONTRAST  TECHNIQUE: Multidetector CT imaging of the cervical spine was performed without intravenous contrast. Multiplanar CT image reconstructions were also generated.  COMPARISON:  CT of the neck 05/14/2013  FINDINGS: Cervical spine is imaged from the skullbase through T2-3. There is calcification in the disc and fusion across the endplates at Z6-1 and W9-6. The posterior elements are fused at these levels as well. Slight degenerative anterolisthesis is present at C4-5. More prominent chronic endplate changes are noted at C5-6 and C6-7 with asymmetric left-sided uncovertebral spurring an osseous foraminal narrowing at these levels.  The previously noted nasopharyngeal nodule is no longer present. And 8 mm hypodense lesion in the right lobe of the thyroid was not evident on the postcontrast images previously. There is some hypoattenuation in the left lobe of the thyroid as well. Atherosclerotic calcifications are present at the aortic arch and to lesser extent carotid bifurcations.  IMPRESSION: 1. No acute fracture or traumatic subluxation. 2. Multilevel spondylosis of the cervical spine. 3. Anterior and posterior fusion at C2-3  and C3-4 is likely acquired. 4. Osseous foramina are scratch the osseous foraminal narrowing is most pronounced on the left at C5-6 and C6-7. 5. Bilateral sub cm thyroid nodules. Sub-centimeter thyroid nodule(s) noted, too small to characterize, but most likely benign in the absence of known clinical risk factors for thyroid carcinoma.   Electronically Signed   By: San Morelle M.D.   On: 05/30/2014 08:35   Mr Virgel Paling Wo Contrast  05/31/2014   CLINICAL DATA:  74 year old male with found down, left thalamic hemorrhage diagnosed on CT. Initial encounter.  EXAM: MRI HEAD WITHOUT CONTRAST  MRA HEAD WITHOUT CONTRAST  TECHNIQUE: Multiplanar, multiecho pulse sequences of the brain and surrounding structures were obtained without intravenous contrast. Angiographic images of the head were obtained using MRA technique without contrast.  COMPARISON:  Head CT without contrast 05/30/2014.  FINDINGS: MRI HEAD FINDINGS  T2 weighted heterogeneous and T1 weighted mostly isointense blood products re - identified in the dorsal left thalamus, encompassing 19 x 30 x 21 mm (AP by transverse by CC), for an estimated blood volume of 6 mL. Edema and secondary mass effect around the thalamic hemorrhage have not significantly changed. Edema does track into the left midbrain. The diffusion signal abnormality present does primarily correspond to the hemorrhage, but there is restricted diffusion in the posterior left corona radiata which does not definitely correspond to blood products (series 4, image 30).  No other restricted diffusion identified.  Intraventricular extension re- identified and has increased. No ventriculomegaly. There is bilateral subarachnoid hemorrhage which is new or increased (bilateral sylvian fissures series 7, image 15, bilateral occipital sulci image 10, and superior cerebellar folia image 10). No subdural or extradural blood  identified. Basilar cisterns remain patent.  Major intracranial vascular flow  voids are preserved.  Elsewhere there is scattered mild to moderate for age nonspecific cerebral white matter T2 and FLAIR hyperintensity. No cortical encephalomalacia identified. Right deep gray matter nuclei are within normal limits. Visible internal auditory structures appear normal. Mastoids are clear. Trace paranasal sinus mucosal thickening. Postoperative changes to the globes. Negative pituitary, cervicomedullary junction and visualized cervical spine. Normal bone marrow signal. Visualized scalp soft tissues are within normal limits.  MRA HEAD FINDINGS  Antegrade flow in the posterior circulation with dominant distal left vertebral artery. Patent PICA origins. Patent vertebrobasilar junction. No basilar stenosis. SCA and PCA origins are normal. Posterior communicating arteries are diminutive or absent. Bilateral PCA branches including the left P1 segment are within normal limits.  Antegrade flow in both ICA siphons no siphon stenosis. Normal ophthalmic artery origins. Normal carotid termini. Normal MCA and ACA origins.  Mildly dominant left A1 segment. Anterior communicating artery and visualized bilateral ACA branches are within normal limits. Visualized bilateral MCA branches are within normal limits.  IMPRESSION: 1. Left thalamic hemorrhage probably represents hemorrhagic transformation of a left thalamic and posterior corona radiata infarct. Intra-axial hemorrhage, edema, and mild mass effect have not significantly changed. 2. Increased intraventricular and bilateral subarachnoid hemorrhage since yesterday. No ventriculomegaly. 3.  Negative intracranial MRA. 4. Underlying mild for age nonspecific signal changes compatible with chronic small vessel disease. Study discussed by telephone with Neurology Dr. Wallie Char on 05/31/2014 at 1626 hours.   Electronically Signed   By: Genevie Ann M.D.   On: 05/31/2014 16:29   Mr Brain Wo Contrast  05/31/2014   CLINICAL DATA:  74 year old male with found down, left  thalamic hemorrhage diagnosed on CT. Initial encounter.  EXAM: MRI HEAD WITHOUT CONTRAST  MRA HEAD WITHOUT CONTRAST  TECHNIQUE: Multiplanar, multiecho pulse sequences of the brain and surrounding structures were obtained without intravenous contrast. Angiographic images of the head were obtained using MRA technique without contrast.  COMPARISON:  Head CT without contrast 05/30/2014.  FINDINGS: MRI HEAD FINDINGS  T2 weighted heterogeneous and T1 weighted mostly isointense blood products re - identified in the dorsal left thalamus, encompassing 19 x 30 x 21 mm (AP by transverse by CC), for an estimated blood volume of 6 mL. Edema and secondary mass effect around the thalamic hemorrhage have not significantly changed. Edema does track into the left midbrain. The diffusion signal abnormality present does primarily correspond to the hemorrhage, but there is restricted diffusion in the posterior left corona radiata which does not definitely correspond to blood products (series 4, image 30).  No other restricted diffusion identified.  Intraventricular extension re- identified and has increased. No ventriculomegaly. There is bilateral subarachnoid hemorrhage which is new or increased (bilateral sylvian fissures series 7, image 15, bilateral occipital sulci image 10, and superior cerebellar folia image 10). No subdural or extradural blood identified. Basilar cisterns remain patent.  Major intracranial vascular flow voids are preserved.  Elsewhere there is scattered mild to moderate for age nonspecific cerebral white matter T2 and FLAIR hyperintensity. No cortical encephalomalacia identified. Right deep gray matter nuclei are within normal limits. Visible internal auditory structures appear normal. Mastoids are clear. Trace paranasal sinus mucosal thickening. Postoperative changes to the globes. Negative pituitary, cervicomedullary junction and visualized cervical spine. Normal bone marrow signal. Visualized scalp soft  tissues are within normal limits.  MRA HEAD FINDINGS  Antegrade flow in the posterior circulation with dominant distal left vertebral artery. Patent PICA origins. Patent  vertebrobasilar junction. No basilar stenosis. SCA and PCA origins are normal. Posterior communicating arteries are diminutive or absent. Bilateral PCA branches including the left P1 segment are within normal limits.  Antegrade flow in both ICA siphons no siphon stenosis. Normal ophthalmic artery origins. Normal carotid termini. Normal MCA and ACA origins.  Mildly dominant left A1 segment. Anterior communicating artery and visualized bilateral ACA branches are within normal limits. Visualized bilateral MCA branches are within normal limits.  IMPRESSION: 1. Left thalamic hemorrhage probably represents hemorrhagic transformation of a left thalamic and posterior corona radiata infarct. Intra-axial hemorrhage, edema, and mild mass effect have not significantly changed. 2. Increased intraventricular and bilateral subarachnoid hemorrhage since yesterday. No ventriculomegaly. 3.  Negative intracranial MRA. 4. Underlying mild for age nonspecific signal changes compatible with chronic small vessel disease. Study discussed by telephone with Neurology Dr. Wallie Char on 05/31/2014 at 1626 hours.   Electronically Signed   By: Genevie Ann M.D.   On: 05/31/2014 16:29   Dg Chest Port 1 View  06/05/2014   CLINICAL DATA:  Short of breath.  Distended abdomen  EXAM: PORTABLE CHEST - 1 VIEW  COMPARISON:  06/04/2014  FINDINGS: Improvement in bibasilar airspace disease likely related to atelectasis. Negative for heart failure or effusion. Heart size normal.  IMPRESSION: Improvement in bibasilar airspace disease most likely related to atelectasis.   Electronically Signed   By: Franchot Gallo M.D.   On: 06/05/2014 09:09   Dg Chest Port 1 View  06/04/2014   CLINICAL DATA:  Acute onset of shortness of breath. Initial encounter.  EXAM: PORTABLE CHEST - 1 VIEW   COMPARISON:  Chest radiograph performed 05/30/2014  FINDINGS: The lungs are hypoexpanded. Patchy left-sided airspace opacity raises concern for mild pneumonia, though mildly asymmetric interstitial edema might have a similar appearance. No pleural effusion or pneumothorax is seen, though the left costophrenic angle is incompletely imaged on this study.  The cardiomediastinal silhouette is within normal limits. No acute osseous abnormalities are seen.  IMPRESSION: Lungs hypoexpanded. Patchy left-sided airspace opacity raises concern for mild pneumonia, though mildly asymmetric interstitial edema might have a similar appearance.   Electronically Signed   By: Garald Balding M.D.   On: 06/04/2014 01:41   Chest Port 1 View  05/30/2014   CLINICAL DATA:  Personal history of stroke and falling event. Acute intracranial hemorrhage.  EXAM: PORTABLE CHEST - 1 VIEW  COMPARISON:  05/23/2013  FINDINGS: Artifact overlies the chest. Heart size is normal. There is calcification of the thoracic aorta. The lungs are clear. The vascularity is normal. No effusions. No bony abnormalities.  IMPRESSION: No active disease.   Electronically Signed   By: Nelson Chimes M.D.   On: 05/30/2014 10:12   Dg Abd Portable 1v  06/05/2014   CLINICAL DATA:  74 year old male shortness of breath and abdominal distention. Initial encounter.  EXAM: PORTABLE ABDOMEN - 1 VIEW  COMPARISON:  Abdomen radiographs 04/01/2009.  FINDINGS: Portable AP supine view at 0850 hours. Distended gas-filled bowel loops in the mid abdomen appear to be redundant colon. Small bowel gas appears within normal limits. Mild bladder distension. No gastric distention is evident. No acute osseous abnormality identified. No definite pneumoperitoneum on this supine view.  IMPRESSION: Gas-filled dilated colon without abnormally dilated small bowel. Favor ileus rather than a distal bowel obstruction.   Electronically Signed   By: Genevie Ann M.D.   On: 06/05/2014 09:11     EKG: Sinus  tachycardia, nonspecific IVC delay, no acute ischemic changes and  unchanged from previous EKG 3/26   Assessment & Plan  Principal Problem:   Sepsis -Discharge from rehabilitation and admit to stepdown unit-if lactic acid greater than or equal to 4.0 will need formal PCCM valuation and possible admission to ICU -Patient has had relative hypotension with systolic blood pressures primarily in the 90s although early this morning systolic did dip to 80. -Source unclear at this time the differential includes healthcare acquired pneumonia versus UTI -Multiple labs pending, white count has increased from 18,300 to 26,100 -Agree with empiric Zosyn and vancomycin and discontinuation of Levaquin at this juncture -Blood cultures, lactic acid, Procalcitonin are pending -check urinary strep and legionella and influenza -ABG did reveal a base deficit of 10.1 which is concerning for likely associated elevated lactic acid and poor perfusion; pH on blood gas was normal as was PCO2 -Urinalysis and culture have been obtained  Active Problems:   Acute respiratory failure with hypoxia/ ? HCAP -Despite 100% nonrebreather has PO2 of 67.6 and diffuse crackles on exam-CXR today no longer shows definitive left-sided infiltrate as compared to chest x-ray from 3/30 -Concerning for evolving ARDS/noncardiogenic edema -Empiric antibiotics as above  -Given recent echocardiogram of preserved LV function and mild grade 1 diastolic dysfunction doubt this is heart failure exacerbation; also in negative balance on I/O -Check d-dimer-positive will need CT of the chest to determine if has pulmonary embolism    Adynamic ileus -No evidence of retained stool on abdominal films -Is not appear to have acute abdomen at this juncture; no free air or distal obstruction seen on abdominal films -Insert flexiseal to see for aid in colonic decompression -Nothing by mouth for now    Thrombocytopenia -Platelets in 2013 were normal at  165,000 -Platelets 05/23/13 were 147,000 -Platelets at admission were 143,000 -Platelet nadir was on 3/30 at 120,000 -Current platelets have trended up slightly to 137,000 -Follow given concerns for underlying sepsis   Acute renal failure -Labs this morning now reveal BUN 29 and creatinine 1.56 -Fluid resuscitation as per protocol for sepsis -Follow labs    Essential hypertension/grade 1 diastolic dysfunction -Blood pressure soft and in the 90s and had been given fluid boluses with the assistance of rapid response nurse -Have discontinued Norvasc and lisinopril    Type 2 diabetes, uncontrolled, with neuropathy -Was on Invokana prior to admission -Check CBGs every 4 hours and provide sliding scale insulin while nothing by mouth    CAD (coronary artery disease) -Has denied recent or current chest pain -Check troponin 1 -EKG nonischemic    Hyperlipidemia -Resume statin once able to tolerate solid diet    Nontraumatic intracranial hemorrhage -Return to rehabilitation unit when medically stable    DVT Prophylaxis: SCDs  Family Communication: No family at bedside-to be updated by rehabilitation PA Reesa Chew    Code Status:  Full Code  Condition:  Guarded  Time spent in minutes : 60   ELLIS,ALLISON L. ANP on 06/05/2014 at 9:21 AM  Between 7am to 7pm - Pager - [phone]  After 7pm go to [email or web address] - password [email or web address]  And look for the night coverage person covering me after hours  [facility]

## 2014-06-06 ENCOUNTER — Encounter (HOSPITAL_COMMUNITY): Payer: Self-pay | Admitting: Student

## 2014-06-06 DIAGNOSIS — J189 Pneumonia, unspecified organism: Secondary | ICD-10-CM

## 2014-06-06 DIAGNOSIS — A419 Sepsis, unspecified organism: Principal | ICD-10-CM

## 2014-06-06 DIAGNOSIS — J9601 Acute respiratory failure with hypoxia: Secondary | ICD-10-CM

## 2014-06-06 LAB — BASIC METABOLIC PANEL
Anion gap: 7 (ref 5–15)
BUN: 28 mg/dL — AB (ref 6–23)
CHLORIDE: 115 mmol/L — AB (ref 96–112)
CO2: 22 mmol/L (ref 19–32)
Calcium: 9.1 mg/dL (ref 8.4–10.5)
Creatinine, Ser: 1.11 mg/dL (ref 0.50–1.35)
GFR calc Af Amer: 75 mL/min — ABNORMAL LOW (ref >90)
GFR calc non Af Amer: 65 mL/min — ABNORMAL LOW (ref >90)
Glucose, Bld: 134 mg/dL — ABNORMAL HIGH (ref 70–99)
POTASSIUM: 3.6 mmol/L (ref 3.5–5.1)
Sodium: 144 mmol/L (ref 135–145)

## 2014-06-06 LAB — GLUCOSE, CAPILLARY
GLUCOSE-CAPILLARY: 107 mg/dL — AB (ref 70–99)
GLUCOSE-CAPILLARY: 138 mg/dL — AB (ref 70–99)
Glucose-Capillary: 149 mg/dL — ABNORMAL HIGH (ref 70–99)
Glucose-Capillary: 131 mg/dL — ABNORMAL HIGH (ref 70–99)
Glucose-Capillary: 113 mg/dL — ABNORMAL HIGH (ref 70–99)

## 2014-06-06 LAB — CBC
HCT: 41 % (ref 39.0–52.0)
Hemoglobin: 13.7 g/dL (ref 13.0–17.0)
MCH: 30.4 pg (ref 26.0–34.0)
MCHC: 33.4 g/dL (ref 30.0–36.0)
MCV: 90.9 fL (ref 78.0–100.0)
Platelets: 98 10*3/uL — ABNORMAL LOW (ref 150–400)
RBC: 4.51 MIL/uL (ref 4.22–5.81)
RDW: 14.1 % (ref 11.5–15.5)
WBC: 12.6 10*3/uL — ABNORMAL HIGH (ref 4.0–10.5)

## 2014-06-06 LAB — MAGNESIUM: Magnesium: 2.2 mg/dL (ref 1.5–2.5)

## 2014-06-06 LAB — PROTIME-INR
INR: 1.33 (ref 0.00–1.49)
PROTHROMBIN TIME: 16.6 s — AB (ref 11.6–15.2)

## 2014-06-06 LAB — URINE CULTURE
COLONY COUNT: NO GROWTH
Culture: NO GROWTH

## 2014-06-06 LAB — PHOSPHORUS: Phosphorus: 2.5 mg/dL (ref 2.3–4.6)

## 2014-06-06 LAB — APTT: aPTT: 36 seconds (ref 24–37)

## 2014-06-06 LAB — TROPONIN I: Troponin I: 0.96 ng/mL (ref <0.031)

## 2014-06-06 LAB — PROCALCITONIN: Procalcitonin: 0.33 ng/mL

## 2014-06-06 MED ORDER — INSULIN ASPART 100 UNIT/ML ~~LOC~~ SOLN
0.0000 [IU] | Freq: Every day | SUBCUTANEOUS | Status: DC
  Administered 2014-06-10: 2 [IU] via SUBCUTANEOUS

## 2014-06-06 MED ORDER — VANCOMYCIN HCL IN DEXTROSE 1-5 GM/200ML-% IV SOLN
1000.0000 mg | Freq: Two times a day (BID) | INTRAVENOUS | Status: DC
  Administered 2014-06-06 – 2014-06-10 (×8): 1000 mg via INTRAVENOUS
  Filled 2014-06-06 (×9): qty 200

## 2014-06-06 MED ORDER — INSULIN ASPART 100 UNIT/ML ~~LOC~~ SOLN
0.0000 [IU] | Freq: Three times a day (TID) | SUBCUTANEOUS | Status: DC
  Administered 2014-06-07: 5 [IU] via SUBCUTANEOUS
  Administered 2014-06-07: 2 [IU] via SUBCUTANEOUS
  Administered 2014-06-08 – 2014-06-09 (×3): 3 [IU] via SUBCUTANEOUS
  Administered 2014-06-09 – 2014-06-10 (×4): 2 [IU] via SUBCUTANEOUS
  Administered 2014-06-10: 3 [IU] via SUBCUTANEOUS
  Administered 2014-06-11 (×3): 2 [IU] via SUBCUTANEOUS

## 2014-06-06 MED ORDER — PIPERACILLIN-TAZOBACTAM 3.375 G IVPB
3.3750 g | Freq: Three times a day (TID) | INTRAVENOUS | Status: DC
Start: 2014-06-06 — End: 2014-06-10
  Administered 2014-06-06 – 2014-06-10 (×12): 3.375 g via INTRAVENOUS
  Filled 2014-06-06 (×13): qty 50

## 2014-06-06 MED ORDER — VITAMIN B-1 100 MG PO TABS
100.0000 mg | ORAL_TABLET | Freq: Every day | ORAL | Status: DC
  Administered 2014-06-07 – 2014-06-11 (×5): 100 mg via ORAL
  Filled 2014-06-06 (×6): qty 1

## 2014-06-06 MED ORDER — FOLIC ACID 1 MG PO TABS
1.0000 mg | ORAL_TABLET | Freq: Every day | ORAL | Status: DC
  Administered 2014-06-07 – 2014-06-11 (×5): 1 mg via ORAL
  Filled 2014-06-06 (×6): qty 1

## 2014-06-06 NOTE — Progress Notes (Signed)
PULMONARY / CRITICAL CARE MEDICINE   Name: Jeffrey [name] MRN: [id number] DOB: Jan 16, 1941    ADMISSION DATE:  06/05/2014   REFERRING MD :  Bonney Aid (CIR)  CHIEF COMPLAINT:  SOB  INITIAL PRESENTATION:  74 y.o. M initially brought to [facility] ED 3/25 with right sided weakness and slurred speech due to left sided IPH with IV extension and bilateral SAH.  Discharged to [place] 3/30.  3/31, had respiratory distress so PCCM called to evaluate for possible transfer.    STUDIES:  CT head 3/25 >>> acute left thalamic and left internal capsul IPH c/w hypertensive hemorrhage.  IVH breakthrough, no hydrocephalus. CT CSpine 3/25 >>> no acute fx, multilevel spondylosis, A/P fusion C2-C4. MRI / MRA brain 3/26 >>> left thalamic hemorrhage, increased intraventricular and bilateral SAH. Echo >>> EF 60 - 65%, grade 1 DD. CXR 3/30 >>> patchy left sided airspace opacity concerning for early PNA, mild asymmetric interstitial edema. CXR 3/31 >>> improvement in bibabsilar airspace disease. AXR 3/31 >>> dilated colon, favor ileus rather than obstruction.  SIGNIFICANT EVENTS: 3/25 - admit with left IPH and IVH, bilateral [facility] 3/29 - discharged to [place] 3/31 - resp distress, transferred to ICU 4/01 - to telemetry  SUBJECTIVE:  Feels better.  Wants to eat.  Denies chest/abd pain. VITAL SIGNS: Temp:  [97.7 F (36.5 C)-99.2 F (37.3 C)] 97.8 F (36.6 C) (04/01 0815) Pulse Rate:  [87-114] 90 (04/01 0815) Resp:  [23-31] 26 (04/01 0815) BP: (97-131)/(57-76) 131/72 mmHg (04/01 0815) SpO2:  [88 %-100 %] 94 % (04/01 0815) FiO2 (%):  [50 %-55 %] 55 % (04/01 0400) Weight:  [186 lb 11.7 oz (84.7 kg)-194 lb 14.2 oz (88.4 kg)] 194 lb 14.2 oz (88.4 kg) (04/01 0815) INTAKE / OUTPUT: Intake/Output      03/31 0701 - 04/01 0700 04/01 0701 - 04/02 0700   I.V. (mL/kg) 1653.3 (19.1)    Other 750    Total Intake(mL/kg) 2403.3 (27.8)    Urine (mL/kg/hr) 1150 400 (1.3)   Total Output 1150 400   Net +1253.3 -400           PHYSICAL EXAMINATION: General: Adult male, appears older than stated age, in NAD. Neuro: A&O x 3, right sided weakness. HEENT: [place]/AT. PERRL, sclerae anicteric. Cardiovascular: regular Lungs: crackles Rt base Abdomen:soft, + bowel sounds Musculoskeletal: No gross deformities, no edema.  Skin: Intact, warm, no rashes.  LABS:  CBC  Recent Labs Lab 06/04/14 0602 06/05/14 0818 06/06/14 0402  WBC 15.3* 26.1* 12.6*  HGB 15.7 16.7 13.7  HCT 46.0 49.4 41.0  PLT 120* 137* 98*   Coag's  Recent Labs Lab 06/06/14 0402  APTT 36  INR 1.33   BMET  Recent Labs Lab 06/04/14 0602 06/05/14 0818 06/06/14 0402  NA 139 140 144  K 3.8 3.7 3.6  CL 108 104 115*  CO2 19 19 22   BUN 20 29* 28*  CREATININE 1.16 1.56* 1.11  GLUCOSE 183* 286* 134*   Electrolytes  Recent Labs Lab 06/04/14 0602 06/05/14 0818 06/06/14 0402  CALCIUM 9.6 9.9 9.1  MG  --   --  2.2  PHOS  --   --  2.5   Sepsis Markers  Recent Labs Lab 06/05/14 0908 06/05/14 1200 06/05/14 1316 06/05/14 1538 06/06/14 0402  LATICACIDVEN 3.3* 2.4*  --  1.5  --   PROCALCITON 0.28  --  0.37  --  0.33   ABG  Recent Labs Lab 06/05/14 0829  PHART 7.368  PCO2ART 25.2*  PO2ART 67.6*  Liver Enzymes  Recent Labs Lab 06/04/14 0602  AST 19  ALT 32  ALKPHOS 96  BILITOT 2.1*  ALBUMIN 3.2*   Cardiac Enzymes  Recent Labs Lab 06/05/14 1316 06/05/14 1710 06/06/14 0019  TROPONINI 1.25* 1.56* 0.96*   Glucose  Recent Labs Lab 06/05/14 1326 06/05/14 1614 06/05/14 2023 06/05/14 2326 06/06/14 0316 06/06/14 0755  GLUCAP 213* 160* 160* 126* 149* 131*    Imaging Ct Abdomen Pelvis Wo Contrast  06/05/2014   CLINICAL DATA:  Abdominal distention with low-grade fever. Rectal tube placed.  EXAM: CT ABDOMEN AND PELVIS WITHOUT CONTRAST  TECHNIQUE: Multidetector CT imaging of the abdomen and pelvis was performed following the standard protocol without IV contrast.  COMPARISON:  Chest radiograph earlier today.   FINDINGS: RIGHT lower lobe pneumonia with moderate-sized effusion. Trace LEFT pleural effusion. RIGHT coronary artery calcification. The pulmonary abnormality is difficult to visualize on chest radiograph  Normal liver, spleen, and adrenal glands. Cholelithiasis without biliary ductal dilatation. Atrophic pancreas. No renal obstruction or calculi. No adrenal enlargement. 4 cm RIGHT lower pole renal cyst. Partial malrotation both kidneys. No bowel obstruction. Nonspecific small and large bowel prominence representing ileus. No adenopathy. Appendix not definitely identified but there is no appendiceal inflammation. No osseous lesions. Rectal tube. Prostatic calcification. Non aneurysmal atherosclerotic calcification of the aorta.  IMPRESSION: RIGHT lower lobe pneumonia with moderate-sized pleural effusion.  Gallstones.  No signs of acute cholecystitis.  Mild ileus without bowel obstruction.  Rectal tube good position.   Electronically Signed   By: Rolla Flatten M.D.   On: 06/05/2014 13:17   Dg Chest Port 1 View  06/05/2014   CLINICAL DATA:  Short of breath.  Distended abdomen  EXAM: PORTABLE CHEST - 1 VIEW  COMPARISON:  06/04/2014  FINDINGS: Improvement in bibasilar airspace disease likely related to atelectasis. Negative for heart failure or effusion. Heart size normal.  IMPRESSION: Improvement in bibasilar airspace disease most likely related to atelectasis.   Electronically Signed   By: Franchot Gallo M.D.   On: 06/05/2014 09:09   Dg Abd Portable 1v  06/05/2014   CLINICAL DATA:  74 year old male shortness of breath and abdominal distention. Initial encounter.  EXAM: PORTABLE ABDOMEN - 1 VIEW  COMPARISON:  Abdomen radiographs 04/01/2009.  FINDINGS: Portable AP supine view at 0850 hours. Distended gas-filled bowel loops in the mid abdomen appear to be redundant colon. Small bowel gas appears within normal limits. Mild bladder distension. No gastric distention is evident. No acute osseous abnormality identified.  No definite pneumoperitoneum on this supine view.  IMPRESSION: Gas-filled dilated colon without abnormally dilated small bowel. Favor ileus rather than a distal bowel obstruction.   Electronically Signed   By: Genevie Ann M.D.   On: 06/05/2014 09:11    ASSESSMENT / PLAN:  NEUROLOGIC A:   Acute left sided IPH with intraventricular extension and bilateral SAH. Hx of ETOH. P:   No anticoags / antiplatelets. Repeat CT in 1 month per neuro recs. Long term BP goals < 130/90 per neuro recs. Continue PT / OT efforts as able. Continue thiamine / folate.  PULMONARY A: Acute hypoxic respiratory failure 2nd to HCAP. Tobacco use disorder. P:   Oxygen to keep SpO2 > 92% F/u CXR Tobacco cessation counseling  CARDIOVASCULAR A:  Sepsis from HCAP. Troponin leak - likely due to demand ischemia. Hx CAD, HTN, HLD, right carotid bruit. P:  Change IV fluids to 75 ml/hr Norvasc and Lisinopril d/c'd (Note, long term BP goals < 130/90 per neuro recs).  RENAL A:   AKI >> Improved. AGMA - lactate >> resolved. P:   Monitor renal fx  GASTROINTESTINAL A:   Ileus - abd distention improved after flexi seal inserted while on CIR >> improved 4/01. GERD. Nutrition. P:   Advance diet 4/01  HEMATOLOGIC A:   Mild thrombocytopenia in setting of sepsis and hx of ETOH. P:  SCD's only CBC in AM  INFECTIOUS A:   HCAP with sepsis. P:   Day 2 vancomycin, zosyn  Blood cx 3/31 >>  ENDOCRINE A:   DM. P:   CBG's q4hr SSI Invokana d/c'd  Transfer to tele.  Will ask [facility] to assume care from 4/02 and PCCM sign off.  Chesley Mires, MD [facility] Pulmonary/Critical Care 06/06/2014, 10:34 AM Pager:  [phone] After 3pm call: [phone]

## 2014-06-06 NOTE — Progress Notes (Signed)
ANTIBIOTIC CONSULT NOTE - FOLLOW UP  Pharmacy Consult for Vancomycin and Zosyn Indication: sepsis  Allergies  Allergen Reactions  . Niacin Other (See Comments)    REACTION: intolerant,not allergic="flushing,hot flashes,turning red"    Patient Measurements: Height: 5\' 8"  (172.7 cm) Weight: 194 lb 14.2 oz (88.4 kg) IBW/kg (Calculated) : 68.4  Vital Signs: Temp: 98.1 F (36.7 C) (04/01 1617) Temp Source: Oral (04/01 1617) BP: 133/65 mmHg (04/01 1617) Pulse Rate: 92 (04/01 1617) Intake/Output from previous day: 03/31 0701 - 04/01 0700 In: 2403.3 [I.V.:1653.3] Out: 1150 [Urine:1150] Intake/Output from this shift: Total I/O In: 984 [P.O.:984] Out: 1100 [Urine:1100]  Labs:  Recent Labs  06/04/14 0602 06/05/14 0818 06/06/14 0402  WBC 15.3* 26.1* 12.6*  HGB 15.7 16.7 13.7  PLT 120* 137* 98*  CREATININE 1.16 1.56* 1.11   Estimated Creatinine Clearance: 66 mL/min (by C-G formula based on Cr of 1.11).  Assessment: 73yom admitted with bilateral SAH was transferred to CIR on 3/29. On 3/30 his CXR was concerning for pneumonia and he was started on PO levaquin. Yesterday his respiratory distress worsened - he was transferred to the ICU and antibiotics were broadened to vancomycin and zosyn.  Patient received one dose of vancomycin and one dose of zosyn before antibiotics were subsequently discontinued (unsure why). CCM has ordered vancomycin and zosyn to resume. Renal function is improved.  3/30 PO levaquin>>3/31 3/31 Vanc x 1, resume 4/1>> 3/31 Zosyn x 1, resume 4/1>> 3/28 Cdiff negative 3/31 urine cx>> negative, final 3/31 blood cx x2>> 1/2 gram + cocci in clusters  Goal of Therapy:  Vancomycin trough level 15-20 mcg/ml  Plan:  1) Vancomycin 1g IV q12 2) Zosyn 3.375g IV q8 (4 hour infusion) 3) Follow renal function, cultures, LOT, level if needed  Deboraha Sprang 06/06/2014,6:51 PM

## 2014-06-06 NOTE — Progress Notes (Signed)
Pt noted to have positive blood cx.  He has order for vancomycin/zosyn per pharmacy while in [place] on 06/05/14 >> not carried over once he was transferred to inpatient acute care.  Will re-order vancomycin/zosyn per pharmacy.  Chesley Mires, MD [facility] Pulmonary/Critical Care 06/06/2014, 6:52 PM Pager:  [phone] After 3pm call: [phone]

## 2014-06-07 ENCOUNTER — Encounter (HOSPITAL_COMMUNITY): Payer: Medicare Other | Admitting: Occupational Therapy

## 2014-06-07 ENCOUNTER — Inpatient Hospital Stay (HOSPITAL_COMMUNITY): Payer: Medicare Other

## 2014-06-07 DIAGNOSIS — I10 Essential (primary) hypertension: Secondary | ICD-10-CM

## 2014-06-07 DIAGNOSIS — E1165 Type 2 diabetes mellitus with hyperglycemia: Secondary | ICD-10-CM

## 2014-06-07 DIAGNOSIS — E1140 Type 2 diabetes mellitus with diabetic neuropathy, unspecified: Secondary | ICD-10-CM

## 2014-06-07 DIAGNOSIS — I629 Nontraumatic intracranial hemorrhage, unspecified: Secondary | ICD-10-CM

## 2014-06-07 LAB — BASIC METABOLIC PANEL
ANION GAP: 6 (ref 5–15)
BUN: 22 mg/dL (ref 6–23)
CO2: 22 mmol/L (ref 19–32)
Calcium: 8.6 mg/dL (ref 8.4–10.5)
Chloride: 111 mmol/L (ref 96–112)
Creatinine, Ser: 1.03 mg/dL (ref 0.50–1.35)
GFR calc Af Amer: 82 mL/min — ABNORMAL LOW (ref >90)
GFR, EST NON AFRICAN AMERICAN: 71 mL/min — AB (ref >90)
GLUCOSE: 127 mg/dL — AB (ref 70–99)
Potassium: 3.5 mmol/L (ref 3.5–5.1)
Sodium: 139 mmol/L (ref 135–145)

## 2014-06-07 LAB — GLUCOSE, CAPILLARY
GLUCOSE-CAPILLARY: 175 mg/dL — AB (ref 70–99)
Glucose-Capillary: 127 mg/dL — ABNORMAL HIGH (ref 70–99)
Glucose-Capillary: 214 mg/dL — ABNORMAL HIGH (ref 70–99)
Glucose-Capillary: 136 mg/dL — ABNORMAL HIGH (ref 70–99)

## 2014-06-07 LAB — CULTURE, BLOOD (ROUTINE X 2)

## 2014-06-07 LAB — CBC
HEMATOCRIT: 38.6 % — AB (ref 39.0–52.0)
HEMOGLOBIN: 12.8 g/dL — AB (ref 13.0–17.0)
MCH: 30 pg (ref 26.0–34.0)
MCHC: 33.2 g/dL (ref 30.0–36.0)
MCV: 90.6 fL (ref 78.0–100.0)
Platelets: 101 10*3/uL — ABNORMAL LOW (ref 150–400)
RBC: 4.26 MIL/uL (ref 4.22–5.81)
RDW: 13.9 % (ref 11.5–15.5)
WBC: 9.4 10*3/uL (ref 4.0–10.5)

## 2014-06-07 LAB — PROCALCITONIN: PROCALCITONIN: 0.3 ng/mL

## 2014-06-07 MED ORDER — TAMSULOSIN HCL 0.4 MG PO CAPS
0.4000 mg | ORAL_CAPSULE | Freq: Every day | ORAL | Status: DC
  Administered 2014-06-07 – 2014-06-11 (×5): 0.4 mg via ORAL
  Filled 2014-06-07 (×5): qty 1

## 2014-06-07 NOTE — Evaluation (Signed)
Occupational Therapy Evaluation Patient Details Name: Jeffrey [name] MRN: [id number] DOB: 1940/06/16 Today's Date: 06/07/2014    History of Present Illness Pt. admitted to acute from CIR on 06/05/14 with acute hypoxic respiratory failure due to HCAP, sepsis.  Pt. with elevated troponin thought to be due to demand ischemia.  Pt. with recent history of ICH, CAD, HTN, ileus, DM .  Pt was initially admitted to [facility] 3/25 with acute Lt thalamic and Lt internal capsule IVH.   Clinical Impression   Pt admitted with above. He demonstrates the below listed deficits and will benefit from continued OT to maximize safety and independence with BADLs.  He presents to OT with impaired balance, impaired sensation, Rt hemiplegia, decreased trunk control, impaired vision, impaired cognition.  Currently, he requires mod - max A for BADLs, and mod A +2 for functional transfers.  Sats remained 92% on 4L supplemental 02.  Recommend return to CIR once medically stable.       Follow Up Recommendations  CIR    Equipment Recommendations  None recommended by OT    Recommendations for Other Services Rehab consult     Precautions / Restrictions Precautions Precautions: Fall Precaution Comments: due to right hemiplegia, diplopia Restrictions Weight Bearing Restrictions: No      Mobility Bed Mobility Overal bed mobility: Needs Assistance Bed Mobility: Supine to Sit     Supine to sit: Max assist     General bed mobility comments: Pt with difficulty initiating movement with Rt side during bed mobility and demonstrated difficulty with motor planning.  Required max A  to move Rt. LE and hips to EOB, and max A to lift trunk   Transfers Overall transfer level: Needs assistance Equipment used: 2 person hand held assist Transfers: Set designer Transfers;Sit to/from Stand Sit to Stand: Max assist Stand pivot transfers: +2 physical assistance;Mod assist Squat pivot transfers: Mod assist;+2 physical  assistance     General transfer comment: Pt performed squat pivot transfer with facilitation to keep trunk flexed.  Transferred to Lt side     Balance Overall balance assessment: Needs assistance Sitting-balance support: Feet supported Sitting balance-Leahy Scale: Fair Sitting balance - Comments: pt. initially needing min guard assist for safe sitting at EOB, however progressed to supervision  Postural control: Left lateral lean Standing balance support: Single extremity supported Standing balance-Leahy Scale: Poor Standing balance comment: +2 mod assist for standing position                            ADL Overall ADL's : Needs assistance/impaired Eating/Feeding: Modified independent;Bed level   Grooming: Wash/dry hands;Wash/dry face;Oral care;Brushing hair;Min guard;Sitting   Upper Body Bathing: Minimal assitance;Sitting   Lower Body Bathing: Maximal assistance;Sit to/from stand   Upper Body Dressing : Moderate assistance;Sitting   Lower Body Dressing: Total assistance;Sit to/from stand Lower Body Dressing Details (indicate cue type and reason): Pt able to cross Lt ankle over Rt knee while seated EOB to attempt to doff sock, but unable to do so and required max A  Toilet Transfer: Moderate assistance;+2 for physical assistance;Squat-pivot   Toileting- Clothing Manipulation and Hygiene: Maximal assistance;Sit to/from stand       Functional mobility during ADLs: Moderate assistance;+2 for physical assistance General ADL Comments: Pt reports dizziness as minimal.       Vision Vision Assessment?: Yes Eye Alignment: Impaired (comment) Ocular Range of Motion: Within Functional Limits Tracking/Visual Pursuits: Other (comment) Visual Fields: No apparent deficits Diplopia Assessment: Disappears  with one eye closed;Objects split on top of one another;Present all the time/all directions Additional Comments: Pt continues with diplopia at all times.  He is tolerating  occlusion to rt lens of glasses.  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  He has poor proprioceptive awareness of Rt UE and reports it often moves around without his awareness.  He has to rely on visual imput to move or use Rt UE.  He is unable to grade force of movement.  He demonstrates gross grasp and release and AAROM WFL.  full hand to mouth actively  RUE Sensation: decreased light touch;decreased proprioception RUE Coordination: decreased fine motor;decreased gross motor   Lower Extremity Assessment Lower Extremity Assessment: Defer to PT evaluation RLE Deficits / Details: weak proximally >distally with motor planning issues RLE Coordination: decreased fine motor   Cervical / Trunk Assessment Cervical / Trunk Assessment: Other exceptions Cervical / Trunk Exceptions: Pt with decreased isolation of lower and upper trunk movements.  He maintains trunk in flexion with head/neck flexed   Communication Communication Communication: No difficulties   Cognition Arousal/Alertness: Awake/alert Behavior During Therapy: WFL for tasks assessed/performed Overall Cognitive Status: Impaired/Different from baseline Area of Impairment: Following commands;Problem solving     Memory: Decreased recall of precautions Following Commands: Follows one step commands with increased time Safety/Judgement: Decreased awareness of safety;Decreased awareness of deficits   Problem Solving: Slow processing;Decreased initiation;Difficulty sequencing;Requires verbal cues;Requires tactile cues     General Comments       Exercises       Shoulder Instructions       Home Living Family/patient expects to be discharged to:: Inpatient rehab Living Arrangements: Spouse/significant other Available Help at Discharge: Family Type of Home: House Home Access: Stairs to enter [employer] of Steps: 3 Entrance Stairs-Rails: Right;Left;Can reach both Home Layout: One level     Bathroom Shower/Tub: Tub/shower unit Shower/tub characteristics: Architectural technologist: Standard Bathroom Accessibility: Yes How Accessible: Accessible via walker     Additional Comments: wife with recent rotator cuff repair curently at [facility]. Will need to clarify home setup.  Lives With: Spouse    Prior Functioning/Environment Level of Independence: Independent        Comments: Retired Theme park manager    OT Diagnosis: Generalized weakness;Cognitive deficits;Disturbance of vision;Hemiplegia dominant side;Apraxia   OT Problem List: Decreased strength;Decreased range of motion;Decreased activity tolerance;Impaired balance (sitting and/or standing);Decreased coordination;Impaired vision/perception;Decreased safety awareness;Decreased knowledge of use of DME or AE;Impaired sensation;Impaired tone;Impaired UE functional use;Pain   OT Treatment/Interventions: Self-care/ADL training;Neuromuscular education;DME and/or AE instruction;Therapeutic activities;Visual/perceptual remediation/compensation;Patient/family education;Balance training    OT Goals(Current goals can be found in the care plan section) Acute Rehab OT Goals Patient Stated Goal: to get strong enough to go back to CIR OT Goal Formulation: With patient Time For Goal Achievement: 06/16/14 Potential to Achieve Goals: Good ADL Goals Pt Will Perform Grooming: with modified independence;sitting Pt Will Perform Upper Body Bathing: with supervision;sitting Pt Will Perform Lower Body Bathing: with min assist;sit to/from stand Pt Will Perform Upper Body Dressing: with min assist;sitting Pt Will Perform Lower  Body Dressing: with mod assist;sit to/from stand Pt Will Transfer to Toilet: with min assist;stand pivot transfer;bedside commode;grab bars;regular height toilet Pt  Will Perform Toileting - Clothing Manipulation and hygiene: with min assist;sit to/from stand Additional ADL Goal #1: Pt will be independent with HEP for vision  Additional ADL Goal #2: Pt will use Rt UE as a gross assist during BADLs  OT Frequency: Min 3X/week   Barriers to D/C: Decreased caregiver support          Co-evaluation PT/OT/SLP Co-Evaluation/Treatment: Yes Reason for Co-Treatment: Complexity of the patient's impairments (multi-system involvement)   OT goals addressed during session: ADL's and self-care      End of Session Equipment Utilized During Treatment: Gait belt Nurse Communication: Mobility status  Activity Tolerance: Patient tolerated treatment well Patient left: in chair;with call bell/phone within reach;with chair alarm set   Time: 4315-4008 OT Time Calculation (min): 37 min Charges:  OT General Charges $OT Visit: 1 Procedure OT Evaluation $Initial OT Evaluation Tier I: 1 Procedure OT Treatments $Therapeutic Activity: 8-22 mins G-Codes:    Breigh Annett M 07-02-2014, 11:26 AM

## 2014-06-07 NOTE — Progress Notes (Signed)
Pt bladder scan volume of >758. Pt has no urge to void. Will in and out cath per protocol.   Will continue to monitor.   Jeffrey [name]   I&O cath received 850 mL of urine output. Post void bladder scan =59mL.   Will continue to monitor.   Jeffrey [name]

## 2014-06-07 NOTE — Evaluation (Addendum)
Physical Therapy Evaluation Patient Details Name: Jeffrey [name] MRN: [id number] DOB: [birth year] Today's Date: 06/07/2014   History of Present Illness  Pt. admitted to acute from CIR on 06/05/14 with acute hypoxic respiratory failure due to HCAP, sepsis.  Pt. with elevated troponin thought to be due to demand ischemia.  Pt. with recent history of ICH, CAD, HTN, ileus, DM .    Clinical Impression  Pt. Presents to PT with depedencies in functional mobility and gait, poor awareness of R UE/LE along with weakness.  Pt. Able to transfer this am during co-eval session with OT at +2 mod assist level with multimodal cueing for management of R UE/LE. Pt. Will benefit from acute PT to address these and below problem areas. I am recommending a return to CIR for ongoing rehabilitation as long as family able to provide needed assist post DC.    Follow Up Recommendations CIR (pending CIR team recommendations )    Equipment Recommendations  Wheelchair (measurements PT);Wheelchair cushion (measurements PT);3in1 (PT)    Recommendations for Other Services Rehab consult     Precautions / Restrictions Precautions Precautions: Fall Precaution Comments: due to right hemiplegia, diplopia Restrictions Weight Bearing Restrictions: No      Mobility  Bed Mobility Overal bed mobility:  (not tested; OT had gotten pt. to EOB upon PT arrival)                Transfers Overall transfer level: Needs assistance   Transfers: Sit to/from Stand Sit to Stand: Mod assist;+2 safety/equipment Stand pivot transfers: +2 physical assistance;Mod assist       General transfer comment: Pt. does best with partial stand pivot and manual/verbal cueing for task; mod assist of 2 rise to partial stand and for guiding of hips over into recliner chair  Ambulation/Gait Ambulation/Gait assistance:  (not attempted; pt. not ready)              Stairs            Wheelchair Mobility    Modified Rankin (Stroke  Patients Only)       Balance Overall balance assessment: Needs assistance Sitting-balance support: Bilateral upper extremity supported;Feet supported Sitting balance-Leahy Scale: Poor Sitting balance - Comments: pt. initially needing min guard assist for safe sitting at EOB, however progressed to supervision  Postural control: Left lateral lean Standing balance support: Bilateral upper extremity supported;During functional activity Standing balance-Leahy Scale: Poor Standing balance comment: +2 mod assist for standing position                             Pertinent Vitals/Pain Pain Assessment: No/denies pain    Home Living Family/patient expects to be discharged to:: Unsure (pt. voices that he hopes he can be strong enough for CIR)   Available Help at Discharge: Family Type of Home: House Home Access: Stairs to enter Entrance Stairs-Rails: Right;Left;Can reach both Entrance Stairs-Number of Steps: 3 Home Layout: One level   Additional Comments: wife with recent rotator cuff repair curently at [facility]. Will need to clarify home setup.    Prior Function Level of Independence: Independent         Comments: Retired Journalist, newspaper   Dominant Hand: Right    Extremity/Trunk Assessment   Upper Extremity Assessment: Defer to OT evaluation           Lower Extremity Assessment: RLE deficits/detail RLE Deficits / Details: weak proximally >distally with motor  planning issues    Cervical / Trunk Assessment: Other exceptions  Communication   Communication: No difficulties  Cognition Arousal/Alertness: Awake/alert Behavior During Therapy: WFL for tasks assessed/performed Overall Cognitive Status: Impaired/Different from baseline Area of Impairment: Following commands;Safety/judgement;Awareness;Problem solving     Memory: Decreased recall of precautions Following Commands: Follows one step commands with increased time Safety/Judgement:  Decreased awareness of safety;Decreased awareness of deficits   Problem Solving: Decreased initiation;Difficulty sequencing;Slow processing      General Comments      Exercises        Assessment/Plan    PT Assessment Patient needs continued PT services  PT Diagnosis Difficulty walking;Abnormality of gait;Generalized weakness;Hemiplegia dominant side   PT Problem List Decreased strength;Decreased activity tolerance;Decreased balance;Decreased mobility;Decreased coordination;Decreased knowledge of use of DME;Decreased knowledge of precautions  PT Treatment Interventions DME instruction;Gait training;Functional mobility training;Therapeutic activities;Therapeutic exercise;Balance training;Patient/family education   PT Goals (Current goals can be found in the Care Plan section) Acute Rehab PT Goals Patient Stated Goal: to get strong enough to go back to CIR PT Goal Formulation: With patient Time For Goal Achievement: 06/21/14 Potential to Achieve Goals: Good    Frequency Min 4X/week   Barriers to discharge Decreased caregiver support      Co-evaluation               End of Session Equipment Utilized During Treatment: Gait belt;Oxygen Activity Tolerance: Patient limited by fatigue Patient left: in chair;with call bell/phone within reach;with chair alarm set Nurse Communication: Mobility status;Other (comment) (+2 mod assist for squat pivot transfers moving to left)         Time: 7903-8333 PT Time Calculation (min) (ACUTE ONLY): 31 min   Charges:   PT Evaluation $Initial PT Evaluation Tier I: 1 Procedure     PT G CodesLadona Ridgel 06/07/2014, 10:26 AM Gerlean Ren PT Acute Rehab Services [phone] Beeper [phone]

## 2014-06-07 NOTE — Progress Notes (Signed)
PROGRESS NOTE  DOMINIK YORDY IRJ:[id number] DOB: January 13, 1941 DOA: 06/05/2014 PCP: [name], MD  HPI/Recap of past 24 hours: Sitting in chair, NAD, reported feeling better.  Assessment/Plan: Principal Problem:   Sepsis Active Problems:   Essential hypertension   Hyperlipidemia   Type 2 diabetes, uncontrolled, with neuropathy   CAD (coronary artery disease)   Nontraumatic intracranial hemorrhage   Acute respiratory failure with hypoxia   HCAP (healthcare-associated pneumonia)   Adynamic ileus   Thrombocytopenia   Acute renal failure   Chronic diastolic heart failure, NYHA class 1  HCAP/spesis: continue vanc/zosyn. Improving. Wean oxygen. Leukocytosis resolved. D/c ivf.  Ileus: resolved, patient reported bmx1 this am, denies ab pain, no n/v. Tolerating diet. D/c ivf.  Troponin leak vs NSTEMI, troponin trended down. patient has significant history of CAD, will obtain ECHO to access wall motion. Not a candidate for anticoagulation or antiplt.  H/o Acute left sided IPH with intraventricular extension and bilateral SAH Avoid anticoagulation/antiplt. bp control/pt/ot/speech. Right sided weakness.  IDDM2, continue insulin SSI   Code Status: full  Family Communication: patient  Disposition Plan: remain inpatient, CIR in next couple of days   Consultants:  pccm   Procedures:  none  Antibiotics:  vanc/zosyn   Objective: BP 146/64 mmHg  Pulse 84  Temp(Src) 97.8 F (36.6 C) (Oral)  Resp 18  Ht 5\' 8"  (1.727 m)  Wt 88.4 kg (194 lb 14.2 oz)  BMI 29.64 kg/m2  SpO2 97%  Intake/Output Summary (Last 24 hours) at 06/07/14 1904 Last data filed at 06/07/14 1828  Gross per 24 hour  Intake    720 ml  Output   1825 ml  Net  -1105 ml   Filed Weights   06/05/14 1105 06/06/14 0300 06/06/14 0815  Weight: 84.7 kg (186 lb 11.7 oz) 86.5 kg (190 lb 11.2 oz) 88.4 kg (194 lb 14.2 oz)    Exam:   General:  NAD  Cardiovascular: RRR  Respiratory:  CTABL  Abdomen: Soft/ND/NT, positive BS  Musculoskeletal: No Edema  Neuro: right hemiparesis  Data Reviewed: Basic Metabolic Panel:  Recent Labs Lab 06/04/14 0602 06/05/14 0818 06/06/14 0402 06/07/14 0417  NA 139 140 144 139  K 3.8 3.7 3.6 3.5  CL 108 104 115* 111  CO2 19 19 22 22   GLUCOSE 183* 286* 134* 127*  BUN 20 29* 28* 22  CREATININE 1.16 1.56* 1.11 1.03  CALCIUM 9.6 9.9 9.1 8.6  MG  --   --  2.2  --   PHOS  --   --  2.5  --    Liver Function Tests:  Recent Labs Lab 06/04/14 0602  AST 19  ALT 32  ALKPHOS 96  BILITOT 2.1*  PROT 5.9*  ALBUMIN 3.2*   No results for input(s): LIPASE, AMYLASE in the last 168 hours. No results for input(s): AMMONIA in the last 168 hours. CBC:  Recent Labs Lab 06/04/14 0602 06/05/14 0818 06/06/14 0402 06/07/14 0417  WBC 15.3* 26.1* 12.6* 9.4  NEUTROABS 13.2*  --   --   --   HGB 15.7 16.7 13.7 12.8*  HCT 46.0 49.4 41.0 38.6*  MCV 90.2 92.0 90.9 90.6  PLT 120* 137* 98* 101*   Cardiac Enzymes:    Recent Labs Lab 06/05/14 0908 06/05/14 1316 06/05/14 1710 06/06/14 0019  TROPONINI 0.23* 1.25* 1.56* 0.96*   BNP (last 3 results)  Recent Labs  06/05/14 0908  BNP 86.1    ProBNP (last 3 results) No results for input(s): PROBNP in the  last 8760 hours.  CBG:  Recent Labs Lab 06/06/14 1640 06/06/14 2202 06/07/14 0617 06/07/14 1132 06/07/14 1618  GLUCAP 113* 138* 127* 214* 136*    Recent Results (from the past 240 hour(s))  MRSA PCR Screening     Status: None   Collection Time: 05/30/14 10:13 AM  Result Value Ref Range Status   MRSA by PCR NEGATIVE NEGATIVE Final    Comment:        The GeneXpert MRSA Assay (FDA approved for NASAL specimens only), is one component of a comprehensive MRSA colonization surveillance program. It is not intended to diagnose MRSA infection nor to guide or monitor treatment for MRSA infections.   Clostridium Difficile by PCR     Status: None   Collection Time:  06/02/14 11:00 AM  Result Value Ref Range Status   C difficile by pcr NEGATIVE NEGATIVE Final  Urine culture     Status: None   Collection Time: 06/05/14  9:15 AM  Result Value Ref Range Status   Specimen Description URINE, RANDOM  Final   Special Requests NONE  Final   Colony Count NO GROWTH Performed at [facility]   Final   Culture NO GROWTH Performed at [facility]   Final   Report Status 06/06/2014 FINAL  Final  Culture, blood (routine x 2)     Status: None   Collection Time: 06/05/14 10:00 AM  Result Value Ref Range Status   Specimen Description BLOOD RIGHT ARM  Final   Special Requests BOTTLES DRAWN AEROBIC AND ANAEROBIC  10CC  Final   Culture   Final    STAPHYLOCOCCUS SPECIES (COAGULASE NEGATIVE) Note: THE SIGNIFICANCE OF ISOLATING THIS ORGANISM FROM A SINGLE SET OF BLOOD CULTURES WHEN MULTIPLE SETS ARE DRAWN IS UNCERTAIN. PLEASE NOTIFY THE MICROBIOLOGY DEPARTMENT WITHIN ONE WEEK IF SPECIATION AND SENSITIVITIES ARE REQUIRED. Note: Gram Stain Report Called to,Read Back By and Verified With: DEE M RN @620PM  Thelma Comp [phone] Performed at [facility]    Report Status 06/07/2014 FINAL  Final  Culture, blood (routine x 2)     Status: None (Preliminary result)   Collection Time: 06/05/14 10:07 AM  Result Value Ref Range Status   Specimen Description BLOOD BLOOD RIGHT FOREARM  Final   Special Requests BOTTLES DRAWN AEROBIC AND ANAEROBIC 10CC  Final   Culture   Final           BLOOD CULTURE RECEIVED NO GROWTH TO DATE CULTURE WILL BE HELD FOR 5 DAYS BEFORE ISSUING A FINAL NEGATIVE REPORT Performed at [facility]    Report Status PENDING  Incomplete     Studies: No results found.  Scheduled Meds: . antiseptic oral rinse  7 mL Mouth Rinse q12n4p  . chlorhexidine  15 mL Mouth Rinse BID  . folic acid  1 mg Oral Daily  . insulin aspart  0-15 Units Subcutaneous TID WC  . insulin aspart  0-5 Units Subcutaneous QHS  . piperacillin-tazobactam  (ZOSYN)  IV  3.375 g Intravenous 3 times per day  . sodium chloride  3 mL Intravenous Q12H  . tamsulosin  0.4 mg Oral Daily  . thiamine  100 mg Oral Daily  . vancomycin  1,000 mg Intravenous Q12H    Continuous Infusions: ivf stopped on 4/2.     Time spent: >31mins  Tremeka Helbling MD, PhD  [facility] Pager [phone]. If 7PM-7AM, please contact night-coverage at [email or web address], password [facility] 06/07/2014, 7:04 PM  LOS: 2 days

## 2014-06-08 ENCOUNTER — Inpatient Hospital Stay (HOSPITAL_COMMUNITY): Payer: Medicare Other | Admitting: Occupational Therapy

## 2014-06-08 ENCOUNTER — Encounter (HOSPITAL_COMMUNITY): Payer: Medicare Other | Admitting: Occupational Therapy

## 2014-06-08 ENCOUNTER — Inpatient Hospital Stay (HOSPITAL_COMMUNITY): Payer: Medicare Other

## 2014-06-08 ENCOUNTER — Inpatient Hospital Stay (HOSPITAL_COMMUNITY): Payer: Medicare Other | Admitting: *Deleted

## 2014-06-08 DIAGNOSIS — R339 Retention of urine, unspecified: Secondary | ICD-10-CM

## 2014-06-08 DIAGNOSIS — K560 Paralytic ileus: Secondary | ICD-10-CM

## 2014-06-08 LAB — URINALYSIS, ROUTINE W REFLEX MICROSCOPIC
Bilirubin Urine: NEGATIVE
Glucose, UA: >1000 mg/dL — AB
LEUKOCYTES UA: NEGATIVE
Nitrite: NEGATIVE
Protein, ur: NEGATIVE mg/dL
Specific Gravity, Urine: 1.037 — ABNORMAL HIGH (ref 1.005–1.030)
UROBILINOGEN UA: 1 mg/dL (ref 0.0–1.0)
pH: 5.5 (ref 5.0–8.0)

## 2014-06-08 LAB — BASIC METABOLIC PANEL
ANION GAP: 10 (ref 5–15)
BUN: 20 mg/dL (ref 6–23)
CO2: 20 mmol/L (ref 19–32)
CREATININE: 0.82 mg/dL (ref 0.50–1.35)
Calcium: 8.2 mg/dL — ABNORMAL LOW (ref 8.4–10.5)
Chloride: 107 mmol/L (ref 96–112)
GFR, EST NON AFRICAN AMERICAN: 87 mL/min — AB (ref >90)
GLUCOSE: 165 mg/dL — AB (ref 70–99)
Potassium: 3.3 mmol/L — ABNORMAL LOW (ref 3.5–5.1)
SODIUM: 137 mmol/L (ref 135–145)

## 2014-06-08 LAB — GLUCOSE, CAPILLARY
Glucose-Capillary: 151 mg/dL — ABNORMAL HIGH (ref 70–99)
Glucose-Capillary: 172 mg/dL — ABNORMAL HIGH (ref 70–99)
Glucose-Capillary: 152 mg/dL — ABNORMAL HIGH (ref 70–99)

## 2014-06-08 LAB — URINE MICROSCOPIC-ADD ON

## 2014-06-08 LAB — CLOSTRIDIUM DIFFICILE BY PCR: Toxigenic C. Difficile by PCR: NEGATIVE

## 2014-06-08 MED ORDER — POLYETHYLENE GLYCOL 3350 17 G PO PACK
17.0000 g | PACK | Freq: Every day | ORAL | Status: DC
  Filled 2014-06-08 (×2): qty 1

## 2014-06-08 MED ORDER — SENNOSIDES-DOCUSATE SODIUM 8.6-50 MG PO TABS
2.0000 | ORAL_TABLET | Freq: Two times a day (BID) | ORAL | Status: DC
  Filled 2014-06-08 (×4): qty 2

## 2014-06-08 NOTE — Progress Notes (Signed)
PROGRESS NOTE  [name] PPJ:[id number] DOB: 05-Jun-1940 DOA: 06/05/2014 PCP: [name], MD  HPI/Recap of past 24 hours: Patient has poor memory, not able to perform immediate recall, denies pain.NAD.following commend. Urinary retention. Though denies symptom. Assessment/Plan: Principal Problem:   Sepsis Active Problems:   Essential hypertension   Hyperlipidemia   Type 2 diabetes, uncontrolled, with neuropathy   CAD (coronary artery disease)   Nontraumatic intracranial hemorrhage   Acute respiratory failure with hypoxia   HCAP (healthcare-associated pneumonia)   Adynamic ileus   Thrombocytopenia   Acute renal failure   Chronic diastolic heart failure, NYHA class 1  Urinary retention: started flomax, prevent constipation, avoid opioids. May need to reinsert foley.  HCAP/spesis: continue vanc/zosyn. Improving. Wean oxygen. Leukocytosis resolved. D/c ivf.  Ileus: resolved, patient reported bmx1 this am, denies ab pain, no n/v. Tolerating diet. D/c ivf.  Troponin leak vs NSTEMI, troponin trended down. patient has significant history of CAD, ECHO pending. Not a candidate for anticoagulation or antiplt.  H/o Acute left sided IPH with intraventricular extension and bilateral SAH Avoid anticoagulation/antiplt. bp control/pt/ot/speech. Right sided weakness.  IDDM2, continue insulin SSI   Code Status: full  Family Communication: patient  Disposition Plan: remain inpatient, CIR in next couple of days   Consultants:  pccm   Procedures:  none  Antibiotics:  vanc/zosyn   Objective: BP 131/60 mmHg  Pulse 81  Temp(Src) 98.4 F (36.9 C) (Oral)  Resp 18  Ht 5\' 8"  (1.727 m)  Wt 87.4 kg (192 lb 10.9 oz)  BMI 29.30 kg/m2  SpO2 96%  Intake/Output Summary (Last 24 hours) at 06/08/14 0958 Last data filed at 06/08/14 0809  Gross per 24 hour  Intake    720 ml  Output   2887 ml  Net  -2167 ml   Filed Weights   06/06/14 0300 06/06/14 0815 06/08/14 0452   Weight: 86.5 kg (190 lb 11.2 oz) 88.4 kg (194 lb 14.2 oz) 87.4 kg (192 lb 10.9 oz)    Exam:   General:  NAD  Cardiovascular: RRR  Respiratory: CTABL  Abdomen: Soft/ND/NT, positive BS  Musculoskeletal: No Edema  Neuro: right hemiparesis, not able to perform immediate recall, aaox3, following commend.  Data Reviewed: Basic Metabolic Panel:  Recent Labs Lab 06/04/14 0602 06/05/14 0818 06/06/14 0402 06/07/14 0417  NA 139 140 144 139  K 3.8 3.7 3.6 3.5  CL 108 104 115* 111  CO2 19 19 22 22   GLUCOSE 183* 286* 134* 127*  BUN 20 29* 28* 22  CREATININE 1.16 1.56* 1.11 1.03  CALCIUM 9.6 9.9 9.1 8.6  MG  --   --  2.2  --   PHOS  --   --  2.5  --    Liver Function Tests:  Recent Labs Lab 06/04/14 0602  AST 19  ALT 32  ALKPHOS 96  BILITOT 2.1*  PROT 5.9*  ALBUMIN 3.2*   No results for input(s): LIPASE, AMYLASE in the last 168 hours. No results for input(s): AMMONIA in the last 168 hours. CBC:  Recent Labs Lab 06/04/14 0602 06/05/14 0818 06/06/14 0402 06/07/14 0417  WBC 15.3* 26.1* 12.6* 9.4  NEUTROABS 13.2*  --   --   --   HGB 15.7 16.7 13.7 12.8*  HCT 46.0 49.4 41.0 38.6*  MCV 90.2 92.0 90.9 90.6  PLT 120* 137* 98* 101*   Cardiac Enzymes:    Recent Labs Lab 06/05/14 0908 06/05/14 1316 06/05/14 1710 06/06/14 0019  TROPONINI 0.23* 1.25* 1.56* 0.96*  BNP (last 3 results)  Recent Labs  06/05/14 0908  BNP 86.1    ProBNP (last 3 results) No results for input(s): PROBNP in the last 8760 hours.  CBG:  Recent Labs Lab 06/06/14 2202 06/07/14 0617 06/07/14 1132 06/07/14 1618 06/07/14 2114  GLUCAP 138* 127* 214* 136* 175*    Recent Results (from the past 240 hour(s))  MRSA PCR Screening     Status: None   Collection Time: 05/30/14 10:13 AM  Result Value Ref Range Status   MRSA by PCR NEGATIVE NEGATIVE Final    Comment:        The GeneXpert MRSA Assay (FDA approved for NASAL specimens only), is one component of a comprehensive  MRSA colonization surveillance program. It is not intended to diagnose MRSA infection nor to guide or monitor treatment for MRSA infections.   Clostridium Difficile by PCR     Status: None   Collection Time: 06/02/14 11:00 AM  Result Value Ref Range Status   C difficile by pcr NEGATIVE NEGATIVE Final  Urine culture     Status: None   Collection Time: 06/05/14  9:15 AM  Result Value Ref Range Status   Specimen Description URINE, RANDOM  Final   Special Requests NONE  Final   Colony Count NO GROWTH Performed at [facility]   Final   Culture NO GROWTH Performed at [facility]   Final   Report Status 06/06/2014 FINAL  Final  Culture, blood (routine x 2)     Status: None   Collection Time: 06/05/14 10:00 AM  Result Value Ref Range Status   Specimen Description BLOOD RIGHT ARM  Final   Special Requests BOTTLES DRAWN AEROBIC AND ANAEROBIC  10CC  Final   Culture   Final    STAPHYLOCOCCUS SPECIES (COAGULASE NEGATIVE) Note: THE SIGNIFICANCE OF ISOLATING THIS ORGANISM FROM A SINGLE SET OF BLOOD CULTURES WHEN MULTIPLE SETS ARE DRAWN IS UNCERTAIN. PLEASE NOTIFY THE MICROBIOLOGY DEPARTMENT WITHIN ONE WEEK IF SPECIATION AND SENSITIVITIES ARE REQUIRED. Note: Gram Stain Report Called to,Read Back By and Verified With: DEE M RN @620PM  Thelma Comp [phone] Performed at [facility]    Report Status 06/07/2014 FINAL  Final  Culture, blood (routine x 2)     Status: None (Preliminary result)   Collection Time: 06/05/14 10:07 AM  Result Value Ref Range Status   Specimen Description BLOOD BLOOD RIGHT FOREARM  Final   Special Requests BOTTLES DRAWN AEROBIC AND ANAEROBIC 10CC  Final   Culture   Final           BLOOD CULTURE RECEIVED NO GROWTH TO DATE CULTURE WILL BE HELD FOR 5 DAYS BEFORE ISSUING A FINAL NEGATIVE REPORT Performed at [facility]    Report Status PENDING  Incomplete     Studies: Dg Chest 2 View  06/07/2014   CLINICAL DATA:  Healthcare associated  pneumonia.  Follow-up exam.  EXAM: CHEST  2 VIEW  COMPARISON:  06/05/2014  FINDINGS: Right lung base opacity is consistent with a combination of a small effusion and either atelectasis, pneumonia or a combination. This is similar to the prior chest radiograph. Remainder of the lungs is clear.  No pneumothorax. Cardiac silhouette is normal in size. No mediastinal or hilar masses or convincing adenopathy.  IMPRESSION: 1. Mild persistent right lung base opacity. This may be due to pneumonia with associated pleural effusion. It could all be due to atelectasis and pleural fluid. 2. No new lung abnormalities.  No evidence of pulmonary edema.  Electronically Signed   By: Lajean Manes M.D.   On: 06/07/2014 08:38    Scheduled Meds: . antiseptic oral rinse  7 mL Mouth Rinse q12n4p  . chlorhexidine  15 mL Mouth Rinse BID  . folic acid  1 mg Oral Daily  . insulin aspart  0-15 Units Subcutaneous TID WC  . insulin aspart  0-5 Units Subcutaneous QHS  . piperacillin-tazobactam (ZOSYN)  IV  3.375 g Intravenous 3 times per day  . polyethylene glycol  17 g Oral Daily  . senna-docusate  2 tablet Oral BID  . sodium chloride  3 mL Intravenous Q12H  . tamsulosin  0.4 mg Oral Daily  . thiamine  100 mg Oral Daily  . vancomycin  1,000 mg Intravenous Q12H    Continuous Infusions: ivf stopped on 4/2.     Time spent: >2mins  Emmanuell Kantz MD, PhD  [facility] Pager [phone]. If 7PM-7AM, please contact night-coverage at [email or web address], password [facility] 06/08/2014, 9:58 AM  LOS: 3 days

## 2014-06-08 NOTE — Progress Notes (Signed)
ANTIBIOTIC CONSULT NOTE - FOLLOW UP  Pharmacy Consult for Vancomycin and Zosyn Indication: sepsis  Allergies  Allergen Reactions  . Niacin Other (See Comments)    REACTION: intolerant,not allergic="flushing,hot flashes,turning red"    Patient Measurements: Height: 5\' 8"  (172.7 cm) Weight: 192 lb 10.9 oz (87.4 kg) IBW/kg (Calculated) : 68.4  Vital Signs: Temp: 98.4 F (36.9 C) (04/03 0452) Temp Source: Oral (04/03 0452) BP: 131/60 mmHg (04/03 0452) Pulse Rate: 81 (04/03 0452) Intake/Output from previous day: 04/02 0701 - 04/03 0700 In: 720 [P.O.:720] Out: 2887 [Urine:2887] Intake/Output from this shift: Total I/O In: 480 [P.O.:480] Out: 350 [Urine:350]  Labs:  Recent Labs  06/06/14 0402 06/07/14 0417 06/08/14 1125  WBC 12.6* 9.4  --   HGB 13.7 12.8*  --   PLT 98* 101*  --   CREATININE 1.11 1.03 0.82   Estimated Creatinine Clearance: 88.8 mL/min (by C-G formula based on Cr of 0.82).  Assessment: 73yom admitted with bilateral SAH was transferred to CIR on 3/29. On 3/30 his CXR was concerning for pneumonia and he was started on PO levaquin. Yesterday his respiratory distress worsened - he was transferred to the ICU and antibiotics were broadened to vancomycin and zosyn.   Patient continues on day #2 of vanc/zosyn for possible sepsis/HCAP. No fevers overnight and leukocytosis has resolved. Scr has also normalized to 0.8. Will likely be able to de-escalate abx soon?  3/30 PO levaquin>>3/31 3/31 Vanc x 1, resume 4/1>> 3/31 Zosyn x 1, resume 4/1>> 3/28 Cdiff negative 3/31 urine cx>> negative, final 3/31 blood cx x2>> 1/2 gram + cocci in clusters>>Coag Negative staph  Goal of Therapy:  Vancomycin trough level 15-20 mcg/ml  Plan:  1) Continue Vancomycin 1g IV q12 2) Continue Zosyn 3.375g IV q8 (4 hour infusion) 3) Follow renal function, cultures, LOT, level if needed  Erin Hearing PharmD., BCPS Clinical Pharmacist Pager [phone] 06/08/2014 2:40 PM

## 2014-06-08 NOTE — Progress Notes (Signed)
Rogue Bussing returned page and gave order for in and out cath. Wants to put off a foley to allow flomax to work per Rogue Bussing, NP.   In and out cathed patient Output 600.   Will continue to monitor   Earlie Lou

## 2014-06-08 NOTE — Progress Notes (Signed)
Bladder scan revealed >972mL in the bladder. Pt unable to void and does not feel the urge. Pt started on flomax yesterday.   Rogue Bussing, NP notified [phone]  Will continue to monitor.   Earlie Lou

## 2014-06-09 DIAGNOSIS — I213 ST elevation (STEMI) myocardial infarction of unspecified site: Secondary | ICD-10-CM

## 2014-06-09 LAB — BASIC METABOLIC PANEL
ANION GAP: 7 (ref 5–15)
BUN: 15 mg/dL (ref 6–23)
CO2: 24 mmol/L (ref 19–32)
CREATININE: 0.84 mg/dL (ref 0.50–1.35)
Calcium: 8.3 mg/dL — ABNORMAL LOW (ref 8.4–10.5)
Chloride: 108 mmol/L (ref 96–112)
GFR, EST NON AFRICAN AMERICAN: 86 mL/min — AB (ref >90)
Glucose, Bld: 141 mg/dL — ABNORMAL HIGH (ref 70–99)
Potassium: 3.2 mmol/L — ABNORMAL LOW (ref 3.5–5.1)
SODIUM: 139 mmol/L (ref 135–145)

## 2014-06-09 LAB — CBC
HCT: 37.6 % — ABNORMAL LOW (ref 39.0–52.0)
HEMOGLOBIN: 12.7 g/dL — AB (ref 13.0–17.0)
MCH: 30 pg (ref 26.0–34.0)
MCHC: 33.8 g/dL (ref 30.0–36.0)
MCV: 88.7 fL (ref 78.0–100.0)
Platelets: 113 10*3/uL — ABNORMAL LOW (ref 150–400)
RBC: 4.24 MIL/uL (ref 4.22–5.81)
RDW: 13.2 % (ref 11.5–15.5)
WBC: 9.7 10*3/uL (ref 4.0–10.5)

## 2014-06-09 LAB — GLUCOSE, CAPILLARY
GLUCOSE-CAPILLARY: 140 mg/dL — AB (ref 70–99)
GLUCOSE-CAPILLARY: 155 mg/dL — AB (ref 70–99)
Glucose-Capillary: 164 mg/dL — ABNORMAL HIGH (ref 70–99)
Glucose-Capillary: 149 mg/dL — ABNORMAL HIGH (ref 70–99)

## 2014-06-09 LAB — PSA: PSA: 1.9 ng/mL (ref <=4.00)

## 2014-06-09 MED ORDER — FLORANEX PO PACK
1.0000 g | PACK | Freq: Three times a day (TID) | ORAL | Status: DC
  Administered 2014-06-09 – 2014-06-11 (×8): 1 g via ORAL
  Filled 2014-06-09 (×9): qty 1

## 2014-06-09 NOTE — Significant Event (Addendum)
Patient with saturation in 88-89% on room air. Patient endorsed mild dyspnea. Placed patient back on 2L [place], worked with patient on flutter valve and incentive spirometry. Patient has a strong dry cough sporadically.

## 2014-06-09 NOTE — Progress Notes (Signed)
Physical Therapy Treatment Patient Details Name: AZAREL BANNER MRN: [id number] DOB: 1940-12-22 Today's Date: 06/09/2014    History of Present Illness Pt. admitted to acute from CIR on 06/05/14 with acute hypoxic respiratory failure due to HCAP, sepsis.  Pt. with elevated troponin thought to be due to demand ischemia.  Pt. with recent history of ICH, CAD, HTN, ileus, DM .  Pt was initially admitted to [facility] 3/25 with acute Lt thalamic and Lt internal capsule IVH.    PT Comments    Pt progressing towards physical therapy goals. Very pleasant and willing to work with therapy, however sequencing is difficult at times and pt requires frequent VC's for technique and safety. Pt with numerous coughing fits when mobility was initiated. RN notified. Will continue to follow and progress as able per POC.   Follow Up Recommendations  CIR     Equipment Recommendations  Wheelchair (measurements PT);Wheelchair cushion (measurements PT);3in1 (PT)    Recommendations for Other Services Rehab consult     Precautions / Restrictions Precautions Precautions: Fall Precaution Comments: due to right hemiplegia, diplopia Restrictions Weight Bearing Restrictions: No    Mobility  Bed Mobility Overal bed mobility: Needs Assistance Bed Mobility: Supine to Sit     Supine to sit: Mod assist;+2 for physical assistance     General bed mobility comments: Pt with difficulty initiating movement with Rt side during bed mobility and demonstrated difficulty with motor planning.  Required assist for LE movement towards EOB and for trunk elevation to full sitting position.   Transfers Overall transfer level: Needs assistance Equipment used: 2 person hand held assist Transfers: Sit to/from [facility] Sit to Stand: Mod assist;+2 physical assistance Stand pivot transfers: Mod assist;+2 physical assistance       General transfer comment: Pt transitioned to recliner chair to the L side. Was able to  stand EOB x2 with R knee block required, however when taking pivotal steps around to the chair knee block was not required.   Ambulation/Gait                 Stairs            Wheelchair Mobility    Modified Rankin (Stroke Patients Only)       Balance Overall balance assessment: Needs assistance Sitting-balance support: Feet supported;Bilateral upper extremity supported Sitting balance-Leahy Scale: Poor     Standing balance support: Bilateral upper extremity supported;During functional activity Standing balance-Leahy Scale: Zero                      Cognition Arousal/Alertness: Awake/alert Behavior During Therapy: WFL for tasks assessed/performed Overall Cognitive Status: Impaired/Different from baseline Area of Impairment: Following commands;Problem solving     Memory: Decreased recall of precautions Following Commands: Follows one step commands with increased time Safety/Judgement: Decreased awareness of safety;Decreased awareness of deficits   Problem Solving: Slow processing;Decreased initiation;Difficulty sequencing;Requires verbal cues;Requires tactile cues      Exercises      General Comments        Pertinent Vitals/Pain Pain Assessment: No/denies pain    Home Living                      Prior Function            PT Goals (current goals can now be found in the care plan section) Acute Rehab PT Goals Patient Stated Goal: to get strong enough to go back to CIR PT Goal Formulation: With  patient Time For Goal Achievement: 06/21/14 Potential to Achieve Goals: Good Progress towards PT goals: Progressing toward goals    Frequency  Min 4X/week    PT Plan Current plan remains appropriate    Co-evaluation             End of Session Equipment Utilized During Treatment: Gait belt Activity Tolerance: Patient limited by fatigue Patient left: in chair;with call bell/phone within reach;with chair alarm set     Time:  9470-7615 PT Time Calculation (min) (ACUTE ONLY): 23 min  Charges:  $Therapeutic Activity: 23-37 mins                    G Codes:      Rolinda Roan 2014-07-02, 11:39 AM  Rolinda Roan, PT, DPT Acute Rehabilitation Services Pager: [phone]

## 2014-06-09 NOTE — Progress Notes (Signed)
Medicare Important Message given? YES  Date Medicare IM given:  06/09/2014 Medicare IM given by: Jonnie Finner

## 2014-06-09 NOTE — Progress Notes (Signed)
  Echocardiogram 2D Echocardiogram has been performed.  Darlina Sicilian M 06/09/2014, 3:06 PM

## 2014-06-09 NOTE — Discharge Summary (Signed)
Discharge summary job # [id number]

## 2014-06-09 NOTE — Progress Notes (Addendum)
Pt admitted to inpt rehab on 3/29 and returned to acute hospital on 3/31 due to medical decline. Noted therapy worked with pt on Sat. Please clarify when pt felt to be medically ready to readmit to inpt rehab and I will pursue [facility] insurance approval for readmission. I will follow up today. [phone] I have begun [facility] approval .

## 2014-06-09 NOTE — Progress Notes (Signed)
Occupational Therapy Treatment Patient Details Name: Jeffrey [name] MRN: [id number] DOB: [birth year] Today's Date: 06/09/2014    History of present illness Pt. admitted to acute from [place] on 06/05/14 with acute hypoxic respiratory failure due to HCAP, sepsis.  Pt. with elevated troponin thought to be due to demand ischemia.  Pt. with recent history of ICH, CAD, HTN, ileus, DM .  Pt was initially admitted to [facility] 3/25 with acute Lt thalamic and Lt internal capsule IVH.   OT comments  Worked on facilitation of reach with Rt UE.  He was able to reach for and retrieve cup from 50-70* shoulder flexion and drink from it with min facilitation.  He has significant ideomotor apraxia.   Follow Up Recommendations  CIR    Equipment Recommendations  None recommended by OT    Recommendations for Other Services Rehab consult    Precautions / Restrictions Precautions Precautions: Fall Precaution Comments: due to right hemiplegia, diplopia       Mobility Bed Mobility                  Transfers                      Balance                                   ADL                                                Vision                     Perception     Praxis      Cognition   Behavior During Therapy: WFL for tasks assessed/performed Overall Cognitive Status: Impaired/Different from baseline       Memory: Decreased short-term memory  Following Commands: Follows one step commands consistently     Problem Solving: Slow processing;Decreased initiation;Difficulty sequencing;Requires verbal cues;Requires tactile cues      Extremity/Trunk Assessment               Exercises Other Exercises Other Exercises: worked on faciliation of reach with Rt UE.  Pt able to reach for cup from 50-70* shoulder flexion and drink from it with min faciliation.  Pt with significant ideomotor apraxia and sensory impairment.  worked on [employer]  force of grasp.    Shoulder Instructions       General Comments      Pertinent Vitals/ Pain       Pain Assessment: No/denies pain  Home Living                                          Prior Functioning/Environment              Frequency Min 3X/week     Progress Toward Goals  OT Goals(current goals can now be found in the care plan section)  Progress towards OT goals: Progressing toward goals     Plan Discharge plan remains appropriate    Co-evaluation                 End of Session  Activity Tolerance Patient tolerated treatment well   Patient Left in chair;with call bell/phone within reach;with chair alarm set;with family/visitor present   Nurse Communication          Time: 5747-3403 OT Time Calculation (min): 24 min  Charges: OT General Charges $OT Visit: 1 Procedure OT Treatments $Neuromuscular Re-education: 23-37 mins  Stefanos Haynesworth M 06/09/2014, 4:32 PM

## 2014-06-09 NOTE — Progress Notes (Signed)
PROGRESS NOTE  Jeffrey [name] RWE:[id number] DOB: [birth year] DOA: 06/05/2014 PCP: [name], MD  HPI/Recap of past 24 hours: Off oxygen, on room air this am. Patient has poor memory, not able to perform immediate recall, denies pain.NAD. Urinary retention foley reinserted. Assessment/Plan: Principal Problem:   Sepsis Active Problems:   Essential hypertension   Hyperlipidemia   Type 2 diabetes, uncontrolled, with neuropathy   CAD (coronary artery disease)   Nontraumatic intracranial hemorrhage   Acute respiratory failure with hypoxia   HCAP (healthcare-associated pneumonia)   Adynamic ileus   Thrombocytopenia   Acute renal failure   Chronic diastolic heart failure, NYHA class 1  Urinary retention: likely neurogenic bladder, discussed with urology Dr. Junious Silk, advised to continue foley, change foley q4wks, continue flomax, outpatient follow up with him in three weeks.  HCAP/spesis: continue vanc/zosyn. Improving. Off oxygen. Leukocytosis resolved. D/c ivf. Repeat CT chest, persistent lower lung fields infiltrates/ atelectasis, encourage incentive spirometer. No reported swallowing problem.  Ileus: resolved, patient reported bmx1 this am, denies ab pain, no n/v. Tolerating diet. D/c ivf.  Troponin leak vs NSTEMI, troponin trended down. patient has significant history of CAD, ECHO no significant interval changes. Not a candidate for anticoagulation or antiplt. Consider outpatient cardiology followup.  H/o Acute left sided IPH with intraventricular extension and bilateral SAH Avoid anticoagulation/antiplt. bp control/pt/ot/speech. Right sided weakness.  IDDM2, continue insulin SSI   Code Status: full  Family Communication: patient  Disposition Plan: remain inpatient, CIR early this week   Consultants:  pccm   CIR  Urology Dr. Junious Silk  Procedures:  none  Antibiotics:  vanc/zosyn   Objective: BP 130/63 mmHg  Pulse 82  Temp(Src) 98.1 F (36.7 C)  (Oral)  Resp 18  Ht 5\' 8"  (1.727 m)  Wt 89.5 kg (197 lb 5 oz)  BMI 30.01 kg/m2  SpO2 92%  Intake/Output Summary (Last 24 hours) at 06/09/14 1922 Last data filed at 06/09/14 1700  Gross per 24 hour  Intake   1650 ml  Output   1000 ml  Net    650 ml   Filed Weights   06/06/14 0815 06/08/14 0452 06/09/14 0352  Weight: 88.4 kg (194 lb 14.2 oz) 87.4 kg (192 lb 10.9 oz) 89.5 kg (197 lb 5 oz)    Exam:   General:  NAD  Cardiovascular: RRR  Respiratory: CTABL  Abdomen: Soft/ND/NT, positive BS  Musculoskeletal: No Edema  Neuro: right hemiparesis, poor memory, not able to perform immediate recall, aaox3, following commend.   Data Reviewed: Basic Metabolic Panel:  Recent Labs Lab 06/05/14 0818 06/06/14 0402 06/07/14 0417 06/08/14 1125 06/09/14 0339  NA 140 144 139 137 139  K 3.7 3.6 3.5 3.3* 3.2*  CL 104 115* 111 107 108  CO2 19 22 22 20 24   GLUCOSE 286* 134* 127* 165* 141*  BUN 29* 28* 22 20 15   CREATININE 1.56* 1.11 1.03 0.82 0.84  CALCIUM 9.9 9.1 8.6 8.2* 8.3*  MG  --  2.2  --   --   --   PHOS  --  2.5  --   --   --    Liver Function Tests:  Recent Labs Lab 06/04/14 0602  AST 19  ALT 32  ALKPHOS 96  BILITOT 2.1*  PROT 5.9*  ALBUMIN 3.2*   No results for input(s): LIPASE, AMYLASE in the last 168 hours. No results for input(s): AMMONIA in the last 168 hours. CBC:  Recent Labs Lab 06/04/14 0602 06/05/14 0818 06/06/14 0402 06/07/14 0417 06/09/14  0339  WBC 15.3* 26.1* 12.6* 9.4 9.7  NEUTROABS 13.2*  --   --   --   --   HGB 15.7 16.7 13.7 12.8* 12.7*  HCT 46.0 49.4 41.0 38.6* 37.6*  MCV 90.2 92.0 90.9 90.6 88.7  PLT 120* 137* 98* 101* 113*   Cardiac Enzymes:    Recent Labs Lab 06/05/14 0908 06/05/14 1316 06/05/14 1710 06/06/14 0019  TROPONINI 0.23* 1.25* 1.56* 0.96*   BNP (last 3 results)  Recent Labs  06/05/14 0908  BNP 86.1    ProBNP (last 3 results) No results for input(s): PROBNP in the last 8760 hours.  CBG:  Recent  Labs Lab 06/08/14 1641 06/08/14 2239 06/09/14 0628 06/09/14 1125 06/09/14 1718  GLUCAP 172* 152* 140* 164* 149*    Recent Results (from the past 240 hour(s))  Clostridium Difficile by PCR     Status: None   Collection Time: 06/02/14 11:00 AM  Result Value Ref Range Status   C difficile by pcr NEGATIVE NEGATIVE Final  Urine culture     Status: None   Collection Time: 06/05/14  9:15 AM  Result Value Ref Range Status   Specimen Description URINE, RANDOM  Final   Special Requests NONE  Final   Colony Count NO GROWTH Performed at [facility]   Final   Culture NO GROWTH Performed at [facility]   Final   Report Status 06/06/2014 FINAL  Final  Culture, blood (routine x 2)     Status: None   Collection Time: 06/05/14 10:00 AM  Result Value Ref Range Status   Specimen Description BLOOD RIGHT ARM  Final   Special Requests BOTTLES DRAWN AEROBIC AND ANAEROBIC  10CC  Final   Culture   Final    STAPHYLOCOCCUS SPECIES (COAGULASE NEGATIVE) Note: THE SIGNIFICANCE OF ISOLATING THIS ORGANISM FROM A SINGLE SET OF BLOOD CULTURES WHEN MULTIPLE SETS ARE DRAWN IS UNCERTAIN. PLEASE NOTIFY THE MICROBIOLOGY DEPARTMENT WITHIN ONE WEEK IF SPECIATION AND SENSITIVITIES ARE REQUIRED. Note: Gram Stain Report Called to,Read Back By and Verified With: DEE M RN @620PM  Thelma Comp [phone] Performed at [facility]    Report Status 06/07/2014 FINAL  Final  Culture, blood (routine x 2)     Status: None (Preliminary result)   Collection Time: 06/05/14 10:07 AM  Result Value Ref Range Status   Specimen Description BLOOD BLOOD RIGHT FOREARM  Final   Special Requests BOTTLES DRAWN AEROBIC AND ANAEROBIC 10CC  Final   Culture   Final           BLOOD CULTURE RECEIVED NO GROWTH TO DATE CULTURE WILL BE HELD FOR 5 DAYS BEFORE ISSUING A FINAL NEGATIVE REPORT Performed at [facility]    Report Status PENDING  Incomplete  Clostridium Difficile by PCR     Status: None   Collection Time:  06/08/14  3:57 PM  Result Value Ref Range Status   C difficile by pcr NEGATIVE NEGATIVE Final     Studies: No results found.  Scheduled Meds: . antiseptic oral rinse  7 mL Mouth Rinse q12n4p  . chlorhexidine  15 mL Mouth Rinse BID  . folic acid  1 mg Oral Daily  . insulin aspart  0-15 Units Subcutaneous TID WC  . insulin aspart  0-5 Units Subcutaneous QHS  . lactobacillus  1 g Oral TID WC  . piperacillin-tazobactam (ZOSYN)  IV  3.375 g Intravenous 3 times per day  . sodium chloride  3 mL Intravenous Q12H  . tamsulosin  0.4 mg Oral Daily  . thiamine  100 mg Oral Daily  . vancomycin  1,000 mg Intravenous Q12H    Continuous Infusions: ivf stopped on 4/2.     Time spent: >60mins  Kimberlly Norgard MD, PhD  [facility] Pager [phone]. If 7PM-7AM, please contact night-coverage at [email or web address], password [facility] 06/09/2014, 7:22 PM  LOS: 4 days

## 2014-06-10 ENCOUNTER — Inpatient Hospital Stay (HOSPITAL_COMMUNITY): Payer: Medicare Other

## 2014-06-10 LAB — COMPREHENSIVE METABOLIC PANEL
ALBUMIN: 2.3 g/dL — AB (ref 3.5–5.2)
ALK PHOS: 93 U/L (ref 39–117)
ALT: 24 U/L (ref 0–53)
AST: 17 U/L (ref 0–37)
Anion gap: 6 (ref 5–15)
BILIRUBIN TOTAL: 1.2 mg/dL (ref 0.3–1.2)
BUN: 12 mg/dL (ref 6–23)
CO2: 27 mmol/L (ref 19–32)
Calcium: 8.3 mg/dL — ABNORMAL LOW (ref 8.4–10.5)
Chloride: 106 mmol/L (ref 96–112)
Creatinine, Ser: 0.81 mg/dL (ref 0.50–1.35)
GFR calc Af Amer: >90 mL/min (ref >90)
GFR, EST NON AFRICAN AMERICAN: 87 mL/min — AB (ref >90)
Glucose, Bld: 133 mg/dL — ABNORMAL HIGH (ref 70–99)
Potassium: 3.2 mmol/L — ABNORMAL LOW (ref 3.5–5.1)
Sodium: 139 mmol/L (ref 135–145)
TOTAL PROTEIN: 5.3 g/dL — AB (ref 6.0–8.3)

## 2014-06-10 LAB — GLUCOSE, CAPILLARY
GLUCOSE-CAPILLARY: 143 mg/dL — AB (ref 70–99)
GLUCOSE-CAPILLARY: 185 mg/dL — AB (ref 70–99)
GLUCOSE-CAPILLARY: 205 mg/dL — AB (ref 70–99)
Glucose-Capillary: 146 mg/dL — ABNORMAL HIGH (ref 70–99)

## 2014-06-10 LAB — VANCOMYCIN, TROUGH: VANCOMYCIN TR: 10.8 ug/mL (ref 10.0–20.0)

## 2014-06-10 LAB — MAGNESIUM: MAGNESIUM: 2 mg/dL (ref 1.5–2.5)

## 2014-06-10 MED ORDER — SULFAMETHOXAZOLE-TRIMETHOPRIM 800-160 MG PO TABS
1.0000 | ORAL_TABLET | Freq: Two times a day (BID) | ORAL | Status: DC
  Administered 2014-06-10 (×2): 1 via ORAL
  Filled 2014-06-10 (×4): qty 1

## 2014-06-10 NOTE — Clinical Social Work Placement (Signed)
Clinical Social Work Department CLINICAL SOCIAL WORK PLACEMENT NOTE 06/10/2014  Patient:  Jeffrey [name], Jeffrey [name]  Account Number:  [id number] Admit date:  06/05/2014  Clinical Social Worker:  Domenica Reamer, CLINICAL SOCIAL WORKER  Date/time:  06/10/2014 03:28 PM  Clinical Social Work is seeking post-discharge placement for this patient at the following level of care:   SKILLED NURSING   (*CSW will update this form in Epic as items are completed)   06/10/2014  Patient/family provided with [place] Department of Clinical Social Work's list of facilities offering this level of care within the geographic area requested by the patient (or if unable, by the patient's family).  06/10/2014  Patient/family informed of their freedom to choose among providers that offer the needed level of care, that participate in Medicare, Medicaid or managed care program needed by the patient, have an available bed and are willing to accept the patient.  06/10/2014  Patient/family informed of [facility]' ownership interest in [facility], as well as of the fact that they are under no obligation to receive care at this facility.  PASARR submitted to EDS on 06/10/2014 PASARR number received on 06/10/2014  FL2 transmitted to all facilities in geographic area requested by pt/family on  06/10/2014 FL2 transmitted to all facilities within larger geographic area on   Patient informed that his/her managed care company has contracts with or will negotiate with  certain facilities, including the following:     Patient/family informed of bed offers received:   Patient chooses bed at  Physician recommends and patient chooses bed at    Patient to be transferred to  on   Patient to be transferred to facility by  Patient and family notified of transfer on  Name of family member notified:    The following physician request were entered in Epic:   Additional Comments: Domenica Reamer, [place] Social  Worker [phone]

## 2014-06-10 NOTE — Progress Notes (Signed)
I met with pt, his wife, and daughter at bedside. I have insurance approval and a bed available to admit pt to inpt rehab tomorrow. They are in agreement. I have notified RN CM and SW. I will arrange in the morning [phone]

## 2014-06-10 NOTE — Clinical Social Work Note (Signed)
CSW spoke with pt at bedside concerning SNF placement if pt is unable to return to CIR- pt states that he would prefer to have the conversation with his wife present since he has had some issues remembering and fully comprehending things while here in the hospital.  CSW will assess when wife arrives.  Domenica Reamer, [place] Social Worker [phone]

## 2014-06-10 NOTE — Clinical Social Work Psychosocial (Signed)
Clinical Social Work Department BRIEF PSYCHOSOCIAL ASSESSMENT 06/10/2014  Patient:  Jeffrey [name], Jeffrey [name]     Account Number:  [id number]     Admit date:  06/05/2014  Clinical Social Worker:  Domenica Reamer, [place]  Date/Time:  06/10/2014 12:00 N  Referred by:  Physician  Date Referred:  06/10/2014 Referred for  SNF Placement   Other Referral:   Interview type:  Patient Other interview type:   also spoke with pt wife at bedside    PSYCHOSOCIAL DATA Living Status:  WIFE Admitted from facility:   Level of care:   Primary support name:  Jeffrey [name] Primary support relationship to patient:  SPOUSE Degree of support available:   high level of support from patient wife who was at bedside and was attempted to help patient get "presentable" despite the fact that she is iill and weakened herself (she is currently at [facility] for rehab)    CURRENT CONCERNS Current Concerns  Post-Acute Placement   Other Concerns:    SOCIAL WORK ASSESSMENT / PLAN CSW spoke to patient concerning the alternative plan if patient is unable to go to CIR. Patient was at [facility] getting rehab for several days until he had to come back to the hospital for further medical treatment.     Patient and wife are agreeable to patient going to [facility] for rehab so the patient can be near his wife who is also in rehab at [place].  The patients first choice would be to go to CIR so that he can recover more quickly.    Patient recently retired from being a Theme park manager and several of his church members were also at bedside- pt has strong belief that god will help him through his current condition.   Assessment/plan status:  Psychosocial Support/Ongoing Assessment of Needs Other assessment/ plan:   FL2  PASAR   Information/referral to community resources:    PATIENT'S/FAMILY'S RESPONSE TO PLAN OF CARE: Patient and pt wife are agreeable to SNF placement if pt unable to return to CIR and just want to do whatever  possible to get the pt better.       Domenica Reamer, [place] Social Worker [phone]

## 2014-06-10 NOTE — Procedures (Signed)
Objective Swallowing Evaluation: Modified Barium Swallowing Study  Patient Details  Name: Jeffrey [name] MRN: [id number] Date of Birth: 04-28-1940  Today's Date: 06/10/2014 Time: SLP Start Time (ACUTE ONLY): 1430-SLP Stop Time (ACUTE ONLY): 1500 SLP Time Calculation (min) (ACUTE ONLY): 30 min  Past Medical History:  Past Medical History  Diagnosis Date  . CAD (coronary artery disease)     PCI distal RCA ...2004, residual 70% LAD   /   ...nuclear...03/2007...no ischemia.Marland KitchenMarland Kitchenpreserved LV /  nuclear...03/03/2009...inferior scar..no ischemia..EF 51%  . Dyslipidemia     takes Atorvastatin daily  . Internal hemorrhoids   . Gout     takes Allopurinol daily and Colchicine as needed  . Seasonal allergic rhinitis   . Right carotid bruit   . Cyst of nasopharynx     per ENT Wilburn Cornelia  . Myocardial infarction 69yrs ago  . Peripheral edema     takes Lasix daily  . HTN (hypertension)     takes Cardura,Metoprolol,[name],and Amlodipine daily  . Arthritis   . GERD (gastroesophageal reflux disease)     takes Omeprazole daily  . History of colon polyps   . Type 2 diabetes, uncontrolled, with neuropathy 1989    takes Invokana daily and has an insulin pump (Dr. [name])  . 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 Cataract extraction Bilateral 2010  . Elbow surgery Right 1997  . Balloon angioplasty, artery  1992, 2004    CAD, Dr. Ron Parker  . Colonoscopy  04/03/2002    adenomatous polyp, int hemorrhoids  . Colonoscopy  07/18/2007    normal (Dr. Fuller Plan)  . Rotator cuff repair  09/2011    left, with subacromial decompression  . Nasal septum surgery    . Colonoscopy  09/26/2012    tubular adenoma, sm int hem, rpt 5 yrs Fuller Plan)  . Tonsillectomy and adenoidectomy    . Eye lids raised    . Cardiac catheterization  2004  . Polypectomy N/A 05/27/2013    Procedure: ENDOSCOPIC  NASOPHARYNGEAL MASS;  Surgeon: [name], MD   HPI:  HPI: 74 year old male transferred 06/05/14 from CIR (due to respiratory distress) after acute hospital stay 3/25-30/16 for left IPH. PMH significant for HTN, CAD, GERD, DM. BSE ordered to reevaluate swallow function and safety in light of recent CXR results indicating righ lower lobe opacity.  No Data Recorded  Assessment / Plan / Recommendation CHL IP CLINICAL IMPRESSIONS 06/10/2014  Dysphagia Diagnosis WFL  Clinical impression Normal oropharyngeal swallow function. No penetration or aspiration observed with any consistency tested. Trace amount of backflow noted from esophagus to pharynx noted after puree, however, dry swallows effectively cleared pharynx. Esophageal sweep following barium tablet revealed no stasis or backflow. Recommend continuing regular diet with thin liquids. No further swallowing therapy recommended at this time.      CHL IP TREATMENT RECOMMENDATION 06/10/2014  Treatment Plan Recommendations No treatment recommended at this time     CHL IP DIET RECOMMENDATION 06/10/2014  Diet Recommendations Regular;Thin liquid  Liquid Administration via Cup;Straw  Medication Administration Whole meds with liquid  Compensations Slow rate;Small sips/bites  Postural Changes and/or Swallow Maneuvers Seated upright 90 degrees;Upright 30-60 min after meal     CHL IP OTHER RECOMMENDATIONS 06/10/2014  Recommended Consults (None)  Oral Care Recommendations Oral care BID  Other Recommendations (None)     CHL IP FOLLOW UP RECOMMENDATIONS 06/10/2014  Follow up  Recommendations Inpatient Rehab     CHL IP FREQUENCY AND DURATION 06/10/2014  Speech Therapy Frequency (ACUTE ONLY) (No Data)  Treatment Duration (None)     Pertinent Vitals/Pain No pain reported, VSS    SLP Swallow Goals No flowsheet data found.  No flowsheet data found.    CHL IP REASON FOR REFERRAL 06/10/2014  Reason for Referral Objectively evaluate swallowing function      CHL IP ORAL PHASE 06/10/2014  Lips (None)  Tongue (None)  Mucous membranes (None)  Nutritional status (None)  Other (None)  Oxygen therapy (None)  Oral Phase WFL  Oral - Pudding Teaspoon (None)  Oral - Pudding Cup (None)  Oral - Honey Teaspoon (None)  Oral - Honey Cup (None)  Oral - Honey Syringe (None)  Oral - Nectar Teaspoon (None)  Oral - Nectar Cup (None)  Oral - Nectar Straw (None)  Oral - Nectar Syringe (None)  Oral - Ice Chips (None)  Oral - Thin Teaspoon (None)  Oral - Thin Cup (None)  Oral - Thin Straw (None)  Oral - Thin Syringe (None)  Oral - Puree (None)  Oral - Mechanical Soft (None)  Oral - Regular (None)  Oral - Multi-consistency (None)  Oral - Pill (None)  Oral Phase - Comment (None)      CHL IP PHARYNGEAL PHASE 06/10/2014  Pharyngeal Phase WFL  Pharyngeal - Pudding Teaspoon (None)  Penetration/Aspiration details (pudding teaspoon) (None)  Pharyngeal - Pudding Cup (None)  Penetration/Aspiration details (pudding cup) (None)  Pharyngeal - Honey Teaspoon (None)  Penetration/Aspiration details (honey teaspoon) (None)  Pharyngeal - Honey Cup (None)  Penetration/Aspiration details (honey cup) (None)  Pharyngeal - Honey Syringe (None)  Penetration/Aspiration details (honey syringe) (None)  Pharyngeal - Nectar Teaspoon (None)  Penetration/Aspiration details (nectar teaspoon) (None)  Pharyngeal - Nectar Cup (None)  Penetration/Aspiration details (nectar cup) (None)  Pharyngeal - Nectar Straw (None)  Penetration/Aspiration details (nectar straw) (None)  Pharyngeal - Nectar Syringe (None)  Penetration/Aspiration details (nectar syringe) (None)  Pharyngeal - Ice Chips (None)  Penetration/Aspiration details (ice chips) (None)  Pharyngeal - Thin Teaspoon (None)  Penetration/Aspiration details (thin teaspoon) (None)  Pharyngeal - Thin Cup (None)  Penetration/Aspiration details (thin cup) (None)  Pharyngeal - Thin Straw (None)  Penetration/Aspiration details  (thin straw) (None)  Pharyngeal - Thin Syringe (None)  Penetration/Aspiration details (thin syringe') (None)  Pharyngeal - Puree (None)  Penetration/Aspiration details (puree) (None)  Pharyngeal - Mechanical Soft (None)  Penetration/Aspiration details (mechanical soft) (None)  Pharyngeal - Regular (None)  Penetration/Aspiration details (regular) (None)  Pharyngeal - Multi-consistency (None)  Penetration/Aspiration details (multi-consistency) (None)  Pharyngeal - Pill (None)  Penetration/Aspiration details (pill) (None)  Pharyngeal Comment (None)     CHL IP CERVICAL ESOPHAGEAL PHASE 06/10/2014  Cervical Esophageal Phase Impaired  Pudding Teaspoon (None)  Pudding Cup (None)  Honey Teaspoon (None)  Honey Cup (None)  Honey Syringe (None)  Nectar Teaspoon (None)  Nectar Cup (None)  Nectar Straw (None)  Nectar Syringe (None)  Thin Teaspoon (None)  Thin Cup (None)  Thin Straw (None)  Thin Syringe (None)  Cervical Esophageal Comment (None)    No flowsheet data found.        Zeven Kocak B. Quentin Ore [facility], CCC-SLP [phone]  Shonna Chock 06/10/2014, 3:19 PM

## 2014-06-10 NOTE — Plan of Care (Signed)
Problem: ICU Phase Progression Outcomes Goal: Initial discharge plan identified Outcome: Progressing Plan to return to CIR once stable Goal: Voiding-avoid urinary catheter unless indicated Outcome: Not Progressing Foley replaced for urinary retention

## 2014-06-10 NOTE — Progress Notes (Signed)
PROGRESS NOTE  Jeffrey [name] FFM:[id number] DOB: 1940/09/28 DOA: 06/05/2014 PCP: [name], MD  HPI/Recap of past 24 hours: On room air, NAD, Patient has poor memory, not able to perform immediate recall, not able to provide reliable interval history. denies pain.  Assessment/Plan: Principal Problem:   Sepsis Active Problems:   Essential hypertension   Hyperlipidemia   Type 2 diabetes, uncontrolled, with neuropathy   CAD (coronary artery disease)   Nontraumatic intracranial hemorrhage   Acute respiratory failure with hypoxia   HCAP (healthcare-associated pneumonia)   Adynamic ileus   Thrombocytopenia   Acute renal failure   Chronic diastolic heart failure, NYHA class 1  Urinary retention: likely neurogenic bladder, discussed with urology Dr. Junious Silk, advised to continue foley, change foley q4wks, continue flomax, outpatient follow up with him in three weeks.  HCAP/spesis: received  Vanc/zosyn from admission, Improving. Off oxygen. Leukocytosis resolved. D/c ivf. Change abx to bactrim on 4/5. Repeat CT chest, persistent lower lung fields infiltrates/ atelectasis, encourage incentive spirometer.  No reported swallowing problem.  Ileus: resolved, +BM, denies ab pain, no n/v. Tolerating diet.   Troponin leak vs NSTEMI, troponin trended down. patient has significant history of CAD, ECHO no significant interval changes. Not a candidate for anticoagulation or antiplt due to recent intracranial bleed. Consider outpatient cardiology followup.  H/o Acute left sided IPH with intraventricular extension and bilateral SAH Avoid anticoagulation/antiplt. bp control/pt/ot/speech. Residual Right sided weakness. CIR placement.  IDDM2, continue insulin SSI   Code Status: full  Family Communication: patient  Disposition Plan: remain inpatient, CIR this week   Consultants:  pccm   CIR  Urology Dr. Junious Silk  Procedures:  none  Antibiotics:  Vanc/zosyn from admission  to 4/4  bactrim DS from 4/5   Objective: BP 135/62 mmHg  Pulse 80  Temp(Src) 98.7 F (37.1 C) (Oral)  Resp 18  Ht 5\' 8"  (1.727 m)  Wt 88.9 kg (195 lb 15.8 oz)  BMI 29.81 kg/m2  SpO2 90%  Intake/Output Summary (Last 24 hours) at 06/10/14 2201 Last data filed at 06/10/14 0647  Gross per 24 hour  Intake      0 ml  Output   1225 ml  Net  -1225 ml   Filed Weights   06/08/14 0452 06/09/14 0352 06/10/14 0417  Weight: 87.4 kg (192 lb 10.9 oz) 89.5 kg (197 lb 5 oz) 88.9 kg (195 lb 15.8 oz)    Exam:   General:  NAD  Cardiovascular: RRR  Respiratory: CTABL  Abdomen: Soft/ND/NT, positive BS  Musculoskeletal: No Edema  Neuro: right hemiparesis, poor memory, not able to perform immediate recall, aaox3, following commend.   Data Reviewed: Basic Metabolic Panel:  Recent Labs Lab 06/06/14 0402 06/07/14 0417 06/08/14 1125 06/09/14 0339 06/10/14 0733  NA 144 139 137 139 139  K 3.6 3.5 3.3* 3.2* 3.2*  CL 115* 111 107 108 106  CO2 22 22 20 24 27   GLUCOSE 134* 127* 165* 141* 133*  BUN 28* 22 20 15 12   CREATININE 1.11 1.03 0.82 0.84 0.81  CALCIUM 9.1 8.6 8.2* 8.3* 8.3*  MG 2.2  --   --   --  2.0  PHOS 2.5  --   --   --   --    Liver Function Tests:  Recent Labs Lab 06/04/14 0602 06/10/14 0733  AST 19 17  ALT 32 24  ALKPHOS 96 93  BILITOT 2.1* 1.2  PROT 5.9* 5.3*  ALBUMIN 3.2* 2.3*   No results for input(s): LIPASE, AMYLASE  in the last 168 hours. No results for input(s): AMMONIA in the last 168 hours. CBC:  Recent Labs Lab 06/04/14 0602 06/05/14 0818 06/06/14 0402 06/07/14 0417 06/09/14 0339  WBC 15.3* 26.1* 12.6* 9.4 9.7  NEUTROABS 13.2*  --   --   --   --   HGB 15.7 16.7 13.7 12.8* 12.7*  HCT 46.0 49.4 41.0 38.6* 37.6*  MCV 90.2 92.0 90.9 90.6 88.7  PLT 120* 137* 98* 101* 113*   Cardiac Enzymes:    Recent Labs Lab 06/05/14 0908 06/05/14 1316 06/05/14 1710 06/06/14 0019  TROPONINI 0.23* 1.25* 1.56* 0.96*   BNP (last 3  results)  Recent Labs  06/05/14 0908  BNP 86.1    ProBNP (last 3 results) No results for input(s): PROBNP in the last 8760 hours.  CBG:  Recent Labs Lab 06/09/14 2042 06/10/14 0628 06/10/14 1157 06/10/14 1734 06/10/14 2106  GLUCAP 155* 143* 185* 146* 205*    Recent Results (from the past 240 hour(s))  Clostridium Difficile by PCR     Status: None   Collection Time: 06/02/14 11:00 AM  Result Value Ref Range Status   C difficile by pcr NEGATIVE NEGATIVE Final  Urine culture     Status: None   Collection Time: 06/05/14  9:15 AM  Result Value Ref Range Status   Specimen Description URINE, RANDOM  Final   Special Requests NONE  Final   Colony Count NO GROWTH Performed at [facility]   Final   Culture NO GROWTH Performed at [facility]   Final   Report Status 06/06/2014 FINAL  Final  Culture, blood (routine x 2)     Status: None   Collection Time: 06/05/14 10:00 AM  Result Value Ref Range Status   Specimen Description BLOOD RIGHT ARM  Final   Special Requests BOTTLES DRAWN AEROBIC AND ANAEROBIC  10CC  Final   Culture   Final    STAPHYLOCOCCUS SPECIES (COAGULASE NEGATIVE) Note: THE SIGNIFICANCE OF ISOLATING THIS ORGANISM FROM A SINGLE SET OF BLOOD CULTURES WHEN MULTIPLE SETS ARE DRAWN IS UNCERTAIN. PLEASE NOTIFY THE MICROBIOLOGY DEPARTMENT WITHIN ONE WEEK IF SPECIATION AND SENSITIVITIES ARE REQUIRED. Note: Gram Stain Report Called to,Read Back By and Verified With: DEE M RN @620PM  Thelma Comp [phone] Performed at [facility]    Report Status 06/07/2014 FINAL  Final  Culture, blood (routine x 2)     Status: None (Preliminary result)   Collection Time: 06/05/14 10:07 AM  Result Value Ref Range Status   Specimen Description BLOOD BLOOD RIGHT FOREARM  Final   Special Requests BOTTLES DRAWN AEROBIC AND ANAEROBIC 10CC  Final   Culture   Final           BLOOD CULTURE RECEIVED NO GROWTH TO DATE CULTURE WILL BE HELD FOR 5 DAYS BEFORE ISSUING A FINAL  NEGATIVE REPORT Performed at [facility]    Report Status PENDING  Incomplete  Clostridium Difficile by PCR     Status: None   Collection Time: 06/08/14  3:57 PM  Result Value Ref Range Status   C difficile by pcr NEGATIVE NEGATIVE Final     Studies: Ct Chest Wo Contrast  06/09/2014   CLINICAL DATA:  Pneumonia  EXAM: CT CHEST WITHOUT CONTRAST  TECHNIQUE: Multidetector CT imaging of the chest was performed following the standard protocol without IV contrast.  COMPARISON:  Abdominal CT from 06/05/2014  FINDINGS: THORACIC INLET/BODY WALL:  Incidental sub cm nodules in the bilateral thyroid gland.  MEDIASTINUM:  Normal heart size. No pericardial effusion. Diffuse atherosclerosis, including the coronary arteries. No acute vascular abnormality. No adenopathy.  LUNG WINDOWS:  Predominantly ground-glass opacity in the right lower lobe is unchanged. There is also new ground-glass density in the left lower lobe. There is some volume loss, but the opacity is more extensive than expected for simple atelectasis. Trace pleural fluid present bilaterally. No cavitation. No edema in the non-opacified lungs.  UPPER ABDOMEN:  Cholelithiasis.  No evidence of inflammation.  OSSEOUS:  No acute fracture.  No suspicious lytic or blastic lesions.  IMPRESSION: 1. Bilateral lower lobe opacification, likely combination atelectasis and pneumonia. The opacity is new on the left since 06/05/2014. 2. Cholelithiasis.   Electronically Signed   By: Monte Fantasia M.D.   On: 06/09/2014 02:25    Scheduled Meds: . antiseptic oral rinse  7 mL Mouth Rinse q12n4p  . chlorhexidine  15 mL Mouth Rinse BID  . folic acid  1 mg Oral Daily  . insulin aspart  0-15 Units Subcutaneous TID WC  . insulin aspart  0-5 Units Subcutaneous QHS  . lactobacillus  1 g Oral TID WC  . sodium chloride  3 mL Intravenous Q12H  . sulfamethoxazole-trimethoprim  1 tablet Oral Q12H  . tamsulosin  0.4 mg Oral Daily  . thiamine  100 mg Oral Daily     Continuous Infusions: ivf stopped on 4/2.     Time spent: >68mins  Morganna Styles MD, PhD  [facility] Pager [phone]. If 7PM-7AM, please contact night-coverage at [email or web address], password [facility] 06/10/2014, 10:01 PM  LOS: 5 days

## 2014-06-10 NOTE — Evaluation (Signed)
Clinical/Bedside Swallow Evaluation Patient Details  Name: Jeffrey [name] MRN: [id number] Date of Birth: 19-Dec-1940  Today's Date: 06/10/2014 Time: SLP Start Time (ACUTE ONLY): 1014 SLP Stop Time (ACUTE ONLY): 1045 SLP Time Calculation (min) (ACUTE ONLY): 31 min  Past Medical History:  Past Medical History  Diagnosis Date  . CAD (coronary artery disease)     PCI distal RCA ...2004, residual 70% LAD   /   ...nuclear...03/2007...no ischemia.Marland KitchenMarland Kitchenpreserved LV /  nuclear...03/03/2009...inferior scar..no ischemia..EF 51%  . Dyslipidemia     takes Atorvastatin daily  . Internal hemorrhoids   . Gout     takes Allopurinol daily and Colchicine as needed  . Seasonal allergic rhinitis   . Right carotid bruit   . Cyst of nasopharynx     per ENT Wilburn Cornelia  . Myocardial infarction 75yrs ago  . Peripheral edema     takes Lasix daily  . HTN (hypertension)     takes Cardura,Metoprolol,[name],and Amlodipine daily  . Arthritis   . GERD (gastroesophageal reflux disease)     takes Omeprazole daily  . History of colon polyps   . Type 2 diabetes, uncontrolled, with neuropathy 1989    takes Invokana daily and has an insulin pump (Dr. [name])  . 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 Cataract extraction Bilateral 2010  . Elbow surgery Right 1997  . Balloon angioplasty, artery  1992, 2004    CAD, Dr. Ron Parker  . Colonoscopy  04/03/2002    adenomatous polyp, int hemorrhoids  . Colonoscopy  07/18/2007    normal (Dr. Fuller Plan)  . Rotator cuff repair  09/2011    left, with subacromial decompression  . Nasal septum surgery    . Colonoscopy  09/26/2012    tubular adenoma, sm int hem, rpt 5 yrs Fuller Plan)  . Tonsillectomy and adenoidectomy    . Eye lids raised    . Cardiac catheterization  2004  . Polypectomy N/A 05/27/2013    Procedure: ENDOSCOPIC NASOPHARYNGEAL MASS;  Surgeon: [name], MD   HPI:  74 year old male transferred 06/05/14 from CIR (due to respiratory distress) after acute hospital stay 3/25-30/16 for left IPH. PMH significant for HTN, CAD, GERD, DM. BSE ordered to reevaluate swallow function and safety in light of recent CXR results indicating righ lower lobe opacity.   Assessment / Plan / Recommendation Clinical Impression  Oral motor examination reveals slight right side facial asymmetry at rest and with ROM. Pt reports decreased sensation on right side of his face. Speech is noted to be mildly dysarthric, due to reduced lingual strength. Lingual ROM is adequate. Velar elevation is minimally reduced on the right, however, pt does not report nasal regurgitation. Pt reports occasional right side anterior leakage during po intake, but none was observed during this assessment. No overt s/s aspiration or clinical indication of airway compromise observed, however, given location of hemorrhage and increased likelihood of dysphagia in this population, objective study is warranted to rule out silent aspiration, which cannot be determined at bedside. Per RN, pt is returning to CIR today, and has not exhibited overt difficulty with liquids, solids, or medication.     Aspiration Risk  Moderate    Diet Recommendation Regular;Thin liquid   Liquid Administration via: Cup;Straw Medication Administration: Whole meds with liquid Supervision: Intermittent supervision to cue for compensatory strategies;Patient able to self feed Compensations: Slow rate;Small sips/bites Postural Changes  and/or Swallow Maneuvers: Seated upright 90 degrees;Upright 30-60 min after meal    Other  Recommendations Recommended Consults: MBS Oral Care Recommendations: Oral care BID   Follow Up Recommendations  Inpatient Rehab    Frequency and Duration  (pending MBS.)      Pertinent Vitals/Pain No pain reported    SLP Swallow Goals  pending completion of MBS   Swallow Study Prior  Functional Status   No history of dysphagia prior to admit, and no overt s/s aspiration on BSE, however, pt now with RLL opacities. Recommend objective study to rule out silent aspiration.    General Date of Onset: 06/05/14 HPI: 74 year old male transferred 06/05/14 from CIR (due to respiratory distress) after acute hospital stay 3/25-30/16 for left IPH. PMH significant for HTN, CAD, GERD, DM. BSE ordered to reevaluate swallow function and safety in light of recent CXR results indicating righ lower lobe opacity. Type of Study: Bedside swallow evaluation Previous Swallow Assessment: BSE 05/31/14. No overt s/s aspiration, regular diet/thin liquid recommended. Diet Prior to this Study: Regular Temperature Spikes Noted: No Respiratory Status: Nasal cannula History of Recent Intubation: No Behavior/Cognition: Alert;Pleasant mood;Confused Oral Cavity - Dentition: Adequate natural dentition Self-Feeding Abilities: Able to feed self Patient Positioning: Upright in bed Baseline Vocal Quality: Clear Volitional Cough: Strong Volitional Swallow: Able to elicit    Oral/Motor/Sensory Function Overall Oral Motor/Sensory Function: Impaired Labial ROM: Reduced right Labial Symmetry: Abnormal symmetry right Labial Strength: Reduced Labial Sensation: Reduced Lingual ROM: Within Functional Limits Lingual Symmetry: Within Functional Limits Lingual Strength: Reduced Lingual Sensation: Within Functional Limits Facial ROM: Reduced right Facial Symmetry: Right droop Facial Strength: Reduced Facial Sensation: Reduced Velum: Impaired right Mandible: Within Functional Limits   Ice Chips Ice chips: Not tested   Thin Liquid Thin Liquid: Within functional limits Presentation: Self Fed;Straw    Nectar Thick Nectar Thick Liquid: Not tested   Honey Thick Honey Thick Liquid: Not tested   Puree Puree: Within functional limits Presentation: Self Fed;Spoon   Solid   GO   Jeffrey [name] B. Quentin Ore [facility],  CCC-SLP [phone] Solid: Within functional limits Presentation: [place], Fredirick Maudlin 06/10/2014,10:49 AM

## 2014-06-10 NOTE — PMR Pre-admission (Signed)
PMR Admission Coordinator Pre-Admission Assessment  Patient: Jeffrey [name] is an 74 y.o., male MRN: [id number] DOB: 1941-02-07 Height: 5\' 8"  (172.7 cm) Weight: 88.4 kg (194 lb 14.2 oz)              Insurance Information HMO: yes    PPO:   PCP:      IPA:      80/20:      OTHER: medicare advantage plan PRIMARY: Blue Medicare      Policy#: [id number]      Subscriber: pt CM Name: Cedric      Phone#: [phone]     Fax#: [phone] Pre-Cert#: [id number] cert 4/6 thru 1/65 when update due    Employer: 06/03/14 Benefits:  Phone #: [phone]     Name: 06/03/14 Eff. Date: 03/07/14     Deduct: none      Out of Pocket Max: $3950      Life Max: none CIR: $275 per day days 1-6 then covers 100%      SNF: no copay days 1-20; $75 per day days 21-49; $100 copay per day days 50-100 Outpatient: $40 copay per visit     Co-Pay: no visit limit Home Health: 100%      Co-Pay: none DME: 80%     Co-Pay: 20% Providers: in network  SECONDARY: none       Medicaid Application Date:       Case Manager:  Disability Application Date:       Case Worker:   Emergency Contact Information Contact Information    Name Relation Home Work [place] Spouse [phone]  [phone]   [facility] Daughter [phone]     [place], [place]   [phone]     Current Medical History  Patient Admitting Diagnosis: left thalamic ICH  History of Present Illness: Jeffrey [name] is a 74 y.o. right handed male with history of hypertension, diabetes mellitus and peripheral neuropathy, coronary artery disease with balloon angioplasty. Independent prior to admission living with his wife. Presented 05/30/2014 with right sided weakness and slurred speech. Cranial CT scan showed acute left thalamic and left internal capsule parenchymal hemorrhage compatible with hypertensive hemorrhage. MRI again notes left thalamic hemorrhage showing some increased intraventricular and bilateral subarachnoid hemorrhage since  recent cranial CT. MRA of the head was negative. Carotid Dopplers with no ICA stenosis. Echocardiogram with ejection fraction of 08% grade 1 diastolic dysfunction. Neurology follow-up with conservative care. Tolerating a regular consistency diet. Admitted to inpt rehab on 06/03/2014.  While admitted, patient became hypoxic, rapid respose was consulted, and pt placed on empiric antibiotics for suspected pneumonia. CXR showed bibasilar airspace disease related to atelectasis. CT of abdomen revealed mild ileus without bowel obstruction. The patient was discharged to acute hospital on 06/05/14.  PCCM felt acute hypoxic respiratory failure related to HCAP and sepsis related to HCAP. Treated with Vanc and Zosyn. Ileus and abdominal distention improved after flexiseal inserted while in CIR. Diet advanced and pt now tolerating regular diet with thin liquids as recommended by SLP. Pt now on 2 liters O2 via nasal cannula.  Difficulty with urinary retention with foley reinserted. Felt to be neurogenic bladder, discussed with urology Dr. Junious Silk, advised to change foley, and then change q 4 weeks, continue flomax, and follow up as an outpatient.   Troponin leak vs NSTEMI, troponin trended down. patient has significant history of CAD, ECHO no significant interval changes. Not a candidate for anticoagulation or antiplt. Consider outpatient cardiology followup.  Total: 6 NIH  Past Medical History  Past Medical History  Diagnosis Date  . CAD (coronary artery disease)     PCI distal RCA ...2004, residual 70% LAD   /   ...nuclear...03/2007...no ischemia.Marland KitchenMarland Kitchenpreserved LV /  nuclear...03/03/2009...inferior scar..no ischemia..EF 51%  . Dyslipidemia     takes Atorvastatin daily  . Internal hemorrhoids   . Gout     takes Allopurinol daily and Colchicine as needed  . Seasonal allergic rhinitis   . Right carotid bruit   . Cyst of nasopharynx     per ENT Wilburn Cornelia  . Myocardial infarction 24yrs ago  . Peripheral  edema     takes Lasix daily  . HTN (hypertension)     takes Cardura,Metoprolol,[name],and Amlodipine daily  . Arthritis   . GERD (gastroesophageal reflux disease)     takes Omeprazole daily  . History of colon polyps   . Type 2 diabetes, uncontrolled, with neuropathy 1989    takes Invokana daily and has an insulin pump (Dr. [name])  . 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 There is no history of Coronary artery disease, Stroke, Cancer, Diabetes, or Colon cancer.  Prior Rehab/Hospitalizations: CIR 06/03/14-06/05/14 when he returned to acute hospital   Current Medications   Current facility-administered medications:  .  antiseptic oral rinse (CPC / CETYLPYRIDINIUM CHLORIDE 0.05%) solution 7 mL, 7 mL, Mouth Rinse, q12n4p, Chesley Mires, MD, 7 mL at 06/10/14 1700 .  chlorhexidine (PERIDEX) 0.12 % solution 15 mL, 15 mL, Mouth Rinse, BID, Chesley Mires, MD, 15 mL at 06/10/14 2133 .  folic acid (FOLVITE) tablet 1 mg, 1 mg, Oral, Daily, Chesley Mires, MD, 1 mg at 06/11/14 1005 .  hydrALAZINE (APRESOLINE) injection 10-40 mg, 10-40 mg, Intravenous, Q4H PRN, Rahul P Desai, PA-C .  insulin aspart (novoLOG) injection 0-15 Units, 0-15 Units, Subcutaneous, TID WC, Chesley Mires, MD, 2 Units at 06/11/14 0806 .  insulin aspart (novoLOG) injection 0-5 Units, 0-5 Units, Subcutaneous, QHS, Chesley Mires, MD, 2 Units at 06/10/14 2132 .  lactobacillus (FLORANEX/LACTINEX) granules 1 g, 1 g, Oral, TID WC, Florencia Reasons, MD, 1 g at 06/11/14 0806 .  levofloxacin (LEVAQUIN) tablet 500 mg, 500 mg, Oral, Daily, Domenic Polite, MD .  sodium chloride 0.9 % injection 3 mL, 3 mL, Intravenous, Q12H, Samella Parr, NP, 3 mL at 06/10/14 1005 .  tamsulosin (FLOMAX) capsule 0.4 mg, 0.4 mg, Oral, Daily, Florencia Reasons, MD, 0.4 mg at 06/11/14 1005 .  thiamine (VITAMIN B-1) tablet 100 mg, 100 mg, Oral, Daily, Chesley Mires, MD, 100  mg at 06/11/14 1005  Patients Current Diet: Diet Carb Modified Fluid consistency:: Thin; Room service appropriate?: Yes  Precautions / Restrictions Precautions Precautions: Fall Precaution Comments: due to right hemiplegia, diplopia Restrictions Weight Bearing Restrictions: No   Prior Activity Level Community (5-7x/wk): retired Theme park manager, radio show, drives, very active   Development worker, international aid / [place] Devices/Equipment: Other (Comment) (insulin pump) Was using insulin pump pta. 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  He has poor proprioceptive  awareness of Rt UE and reports it often moves around without his awareness.  He has to rely on visual imput to move or use Rt UE.  He is unable to grade force of movement.  He demonstrates gross grasp and release and AAROM WFL.  full hand to mouth actively  RUE Sensation: decreased light touch, decreased proprioception RUE Coordination: decreased fine motor, decreased gross motor  Lower Extremity Assessment: Defer to PT evaluation RLE Deficits / Details: weak proximally >distally with motor planning issues RLE Coordination: decreased fine motor    ADLs  Overall ADL's : Needs assistance/impaired Eating/Feeding: Modified independent, Bed level Grooming: Wash/dry hands, Wash/dry face, Oral care, Brushing hair, Min guard, Sitting Upper Body Bathing: Minimal assitance, Sitting Lower Body Bathing: Maximal  assistance, Sit to/from stand Upper Body Dressing : Moderate assistance, Sitting Lower Body Dressing: Total assistance, Sit to/from stand Lower Body Dressing Details (indicate cue type and reason): Pt able to cross Lt ankle over Rt knee while seated EOB to attempt to doff sock, but unable to do so and required max A  Toilet Transfer: Moderate assistance, +2 for physical assistance, Squat-pivot Toileting- Clothing Manipulation and Hygiene: Maximal assistance, Sit to/from stand Functional mobility during ADLs: Moderate assistance, +2 for physical assistance General ADL Comments: Pt reports dizziness as minimal.      Mobility  Overal bed mobility: Needs Assistance Bed Mobility: Supine to Sit Supine to sit: Mod assist, +2 for physical assistance General bed mobility comments: Pt with difficulty initiating movement with Rt side during bed mobility and demonstrated difficulty with motor planning.  Required assist for LE movement towards EOB and for trunk elevation to full sitting position.     Transfers  Overall transfer level: Needs assistance Equipment used: 2 person hand held assist Transfers: Sit to/from Stand, Stand Pivot Transfers Sit to Stand: Mod assist, +2 physical assistance Stand pivot transfers: Mod assist, +2 physical assistance Squat pivot transfers: Mod assist, +2 physical assistance General transfer comment: Pt transitioned to recliner chair to the L side. Was able to stand EOB x2 with R knee block required, however when taking pivotal steps around to the chair knee block was not required.     Ambulation / Gait / Stairs / Wheelchair Mobility  Ambulation/Gait Ambulation/Gait assistance:  (not attempted; pt. not ready)    Posture / Balance Dynamic Sitting Balance Sitting balance - Comments: pt. initially needing min guard assist for safe sitting at EOB, however progressed to supervision  Balance Overall balance assessment: Needs assistance Sitting-balance support: Feet  supported, Bilateral upper extremity supported Sitting balance-Leahy Scale: Poor Sitting balance - Comments: pt. initially needing min guard assist for safe sitting at EOB, however progressed to supervision  Postural control: Left lateral lean Standing balance support: Bilateral upper extremity supported, During functional activity Standing balance-Leahy Scale: Zero Standing balance comment: +2 mod assist for standing position    Special needs/care consideration                              Bowel mgmt: 06/10/14 continent Bladder mgmt: foley Diabetic mgmt yes. Was using insulin pump pta but not using in hospital O2 at 2 liters via nasal cannula   Previous Home Environment Living Arrangements: Spouse/significant other  Lives With: Spouse Available Help at Discharge: Family Type of Home: House Home Layout: One level Home Access: Stairs to enter Entrance Stairs-Rails: Right, Left, Can reach both Entrance Stairs-Number of Steps: 3 Bathroom Shower/Tub: Chiropodist:  Standard Bathroom Accessibility: Yes How Accessible: Accessible via walker Home Care Services: No Additional Comments: wife with recent rotator cuff repair curently at [facility]. Will need to clarify home setup.  Discharge Living Setting Plans for Discharge Living Setting: Patient's home, Lives with (comment), Other (Comment) (spouse) Type of Home at Discharge: House Discharge Home Layout: One level Discharge Home Access: Stairs to enter Entrance Stairs-Rails: Right, Left, Can reach both Entrance Stairs-Number of Steps: 3 Discharge Bathroom Shower/Tub: Tub/shower unit, Curtain Discharge Bathroom Toilet: Standard Discharge Bathroom Accessibility: Yes How Accessible: Accessible via walker Does the patient have any problems obtaining your medications?: No  Social/Family/Support Systems Patient Roles: Spouse, Parent, Other (Comment) Contact Information: Yanky [place], wife [name], daughter and  son, Jenny Reichmann Anticipated Caregiver: pt likely will SNF rehab for wife currently at [facility] after rotator cuff surgery Anticipated Caregiver's Contact Information: see above Ability/Limitations of Caregiver: Wife had recent rotator cuff surgery-recuperating at [facility] currently-will be limited in her assisting pt at discharge Caregiver Availability: Other (Comment) Discharge Plan Discussed with Primary Caregiver: Yes Is Caregiver In Agreement with Plan?: Yes Does Caregiver/Family have Issues with Lodging/Transportation while Pt is in Rehab?: No  Pt, his wife, daughter, and son aware that unless 24/7 supervision to min assist can be arranged, be will likely need SNF after CIR. Church friends bring wife up daily to visit after she receives her rehab at [facility]. She is ambulatory with poor balance and h/o falls. She does not have her d/c date from [facility] defined yet. Daughter graduating from med tech school in May and is a single Mom and can not do 24/7. Son from out of town and visits frequently and very supportive.  Goals/Additional Needs Patient/Family Goal for Rehab: supervision to min assist with PT, OT, and SLP Expected length of stay: ELOS 15- 18 days Pt/Family Agrees to Admission and willing to participate: Yes Program Orientation Provided & Reviewed with Pt/Caregiver Including Roles  & Responsibilities: Yes   Decrease burden of Care through IP rehab admission: Decrease number of caregivers, Bowel and bladder program and Patient/family education, medical stabilization   Possible need for SNF placement upon discharge: very likely and family are aware and in agreement. Pt does have some VA benefits and son is checking into any assistance they can provide eventually at home.  Patient Condition: This patient's medical and functional status has changed since the discharge on 06/05/14 to acute due to medical issues. See "History of Present Illness" (above) for medical update.  Functional changes are: mod assist overall. Patient's medical and functional status update has been discussed with the Rehabilitation physician and patient remains appropriate for inpatient rehabilitation. Will admit to inpatient rehab today.  Preadmission Screen Completed By:  Cleatrice Burke, 06/11/2014 10:07 AM ______________________________________________________________________   Discussed status with Dr. Naaman Plummer on 06/11/2014 at  53 and received telephone approval for admission today.  Admission Coordinator:  Cleatrice Burke, time 1006 Date 06/11/2014.

## 2014-06-10 NOTE — Discharge Summary (Signed)
NAME:  Jeffrey [name], Jeffrey [name]                    ACCOUNT NO.:  MEDICAL RECORD NO.:  [id number]  LOCATION:                                 FACILITY:  PHYSICIAN:  Lauraine Rinne, P.A.  DATE OF BIRTH:  1940/06/12  DATE OF ADMISSION:  06/03/2014 DATE OF DISCHARGE:  06/05/2014                              DISCHARGE SUMMARY   DISCHARGE DIAGNOSES: 1. Functional deficits secondary to left thalamic intracerebral     hemorrhage. 2. Sequential compression devices for deep venous thrombosis     prophylaxis. 3. Acute hypoxia with tachycardia-pneumonia. 4. Suspect ileus. 5. Hypertension. 6. Diabetes mellitus peripheral neuropathy.  HISTORY OF PRESENT ILLNESS:  This is a 74 year old right-handed male with a history of diabetes mellitus with insulin pump, independent prior to admission, living with his wife.  He presented on May 30, 2014, with right-sided weakness and slurred speech.  A cranial CT scan showed acute left thalamic and left internal capsule parenchymal hemorrhage compatible with hypertensive hemorrhage.  MRI again noted left thalamic hemorrhage, increased intraventricular bilateral subarachnoid hemorrhage since recent cranial CT.  MRI and MRA of the head is negative.  Carotid Dopplers with no ICA stenosis.  Echocardiogram with ejection fraction of 66% grade 1 diastolic dysfunction.  Neurology followup conservative care.  Maintained on a regular diet.  The patient was admitted for a comprehensive rehab program.  PAST MEDICAL HISTORY:  See discharge diagnoses.  SOCIAL HISTORY:  Lives with spouse, independent prior to admission. Functional status upon admission to rehab services was max assist with sit to stand, moderate assist with side lying to sitting, mod max assist with activities of daily living.  PHYSICAL EXAMINATION:  VITAL SIGNS:  Blood pressure 138/70, pulse 75, temperature 98, and respirations 20. GENERAL:  This was an alert male.  He made good eye contact with examiner.   Oriented to person, place, date of birth.  Followed simple commands.  Fair awareness of deficits.  Speech was dysarthric but intelligible. LUNGS:  Clear to auscultation. CARDIAC:  Regular rate and rhythm. ABDOMEN:  Soft and nontender.  Good bowel sounds.  REHABILITATION HOSPITAL COURSE:  The patient was admitted to inpatient rehab services with therapies initiated on a 3-hour daily basis consisting of physical therapy, occupational therapy, and rehabilitation nursing.  The following issues were addressed during the patient's rehabilitation stay.  Pertaining to Mr. Fury left thalamic ICH remained stable.  Follow up per Neurology Services.  Blood pressures monitored.  He did have a history of diabetes mellitus with insulin pump followed by Endocrinology Services.  The patient is progressing slowly while on rehab services, noted 331 with bouts of hypotension, oxygen saturations 93%, placed on intravenous fluids.  The patient became more hypoxic, rapid response consulted, placed on empiric antibiotics for suspect pneumonia.  Chest x-ray showed bibasilar airspace disease related to atelectasis.  A CT of the abdomen, mild ileus without bowel obstruction.  Rectal tube was placed due to medical decline.  Medical team was consulted.  The patient was discharged to acute care services with condition guarded and monitoring.  Noted elevated white blood cell count of 26,000 as he remained on empiric antibiotics.  Condition at time of discharge  was guarded.  All issues in regard to medication changes were made per medical team.  Blood culture showed 1 of 2 gram- positive in clusters, gram-negative Staph.  Urine culture was negative.     Lauraine Rinne, P.A.     DA/MEDQ  D:  06/09/2014  T:  06/09/2014  Job:  [id number]

## 2014-06-11 ENCOUNTER — Inpatient Hospital Stay (HOSPITAL_COMMUNITY)
Admission: AD | Admit: 2014-06-11 | Discharge: 2014-06-25 | DRG: 056 | Disposition: A | Discharge status: Skilled Nursing Facility | Payer: Medicare Other | Source: Intra-hospital | Attending: Physical Medicine & Rehabilitation | Admitting: Physical Medicine & Rehabilitation

## 2014-06-11 DIAGNOSIS — J189 Pneumonia, unspecified organism: Secondary | ICD-10-CM | POA: Diagnosis present

## 2014-06-11 DIAGNOSIS — Y95 Nosocomial condition: Secondary | ICD-10-CM | POA: Diagnosis present

## 2014-06-11 DIAGNOSIS — I2510 Atherosclerotic heart disease of native coronary artery without angina pectoris: Secondary | ICD-10-CM | POA: Diagnosis present

## 2014-06-11 DIAGNOSIS — K219 Gastro-esophageal reflux disease without esophagitis: Secondary | ICD-10-CM | POA: Diagnosis present

## 2014-06-11 DIAGNOSIS — I10 Essential (primary) hypertension: Secondary | ICD-10-CM | POA: Diagnosis present

## 2014-06-11 DIAGNOSIS — I69222 Dysarthria following other nontraumatic intracranial hemorrhage: Secondary | ICD-10-CM | POA: Diagnosis not present

## 2014-06-11 DIAGNOSIS — I252 Old myocardial infarction: Secondary | ICD-10-CM | POA: Diagnosis not present

## 2014-06-11 DIAGNOSIS — Z9861 Coronary angioplasty status: Secondary | ICD-10-CM | POA: Diagnosis not present

## 2014-06-11 DIAGNOSIS — M109 Gout, unspecified: Secondary | ICD-10-CM | POA: Diagnosis present

## 2014-06-11 DIAGNOSIS — K567 Ileus, unspecified: Secondary | ICD-10-CM | POA: Diagnosis present

## 2014-06-11 DIAGNOSIS — E1142 Type 2 diabetes mellitus with diabetic polyneuropathy: Secondary | ICD-10-CM | POA: Diagnosis present

## 2014-06-11 DIAGNOSIS — R208 Other disturbances of skin sensation: Secondary | ICD-10-CM | POA: Diagnosis not present

## 2014-06-11 DIAGNOSIS — R600 Localized edema: Secondary | ICD-10-CM | POA: Diagnosis present

## 2014-06-11 DIAGNOSIS — E1165 Type 2 diabetes mellitus with hyperglycemia: Secondary | ICD-10-CM

## 2014-06-11 DIAGNOSIS — I69398 Other sequelae of cerebral infarction: Secondary | ICD-10-CM

## 2014-06-11 DIAGNOSIS — Z87891 Personal history of nicotine dependence: Secondary | ICD-10-CM

## 2014-06-11 DIAGNOSIS — E785 Hyperlipidemia, unspecified: Secondary | ICD-10-CM | POA: Diagnosis present

## 2014-06-11 DIAGNOSIS — E1159 Type 2 diabetes mellitus with other circulatory complications: Secondary | ICD-10-CM | POA: Diagnosis present

## 2014-06-11 DIAGNOSIS — I69898 Other sequelae of other cerebrovascular disease: Secondary | ICD-10-CM | POA: Diagnosis not present

## 2014-06-11 DIAGNOSIS — I619 Nontraumatic intracerebral hemorrhage, unspecified: Secondary | ICD-10-CM | POA: Insufficient documentation

## 2014-06-11 DIAGNOSIS — I69251 Hemiplegia and hemiparesis following other nontraumatic intracranial hemorrhage affecting right dominant side: Secondary | ICD-10-CM | POA: Diagnosis present

## 2014-06-11 DIAGNOSIS — R05 Cough: Secondary | ICD-10-CM

## 2014-06-11 DIAGNOSIS — R339 Retention of urine, unspecified: Secondary | ICD-10-CM | POA: Diagnosis present

## 2014-06-11 DIAGNOSIS — Z9641 Presence of insulin pump (external) (internal): Secondary | ICD-10-CM | POA: Diagnosis present

## 2014-06-11 DIAGNOSIS — R209 Unspecified disturbances of skin sensation: Secondary | ICD-10-CM

## 2014-06-11 DIAGNOSIS — I629 Nontraumatic intracranial hemorrhage, unspecified: Secondary | ICD-10-CM | POA: Diagnosis not present

## 2014-06-11 DIAGNOSIS — E1140 Type 2 diabetes mellitus with diabetic neuropathy, unspecified: Secondary | ICD-10-CM | POA: Diagnosis present

## 2014-06-11 DIAGNOSIS — E1149 Type 2 diabetes mellitus with other diabetic neurological complication: Secondary | ICD-10-CM | POA: Diagnosis present

## 2014-06-11 DIAGNOSIS — R059 Cough, unspecified: Secondary | ICD-10-CM

## 2014-06-11 LAB — GLUCOSE, CAPILLARY
GLUCOSE-CAPILLARY: 146 mg/dL — AB (ref 70–99)
GLUCOSE-CAPILLARY: 173 mg/dL — AB (ref 70–99)
Glucose-Capillary: 140 mg/dL — ABNORMAL HIGH (ref 70–99)
Glucose-Capillary: 147 mg/dL — ABNORMAL HIGH (ref 70–99)

## 2014-06-11 LAB — BASIC METABOLIC PANEL
ANION GAP: 8 (ref 5–15)
BUN: 11 mg/dL (ref 6–23)
CO2: 26 mmol/L (ref 19–32)
Calcium: 8.5 mg/dL (ref 8.4–10.5)
Chloride: 103 mmol/L (ref 96–112)
Creatinine, Ser: 0.8 mg/dL (ref 0.50–1.35)
GFR calc Af Amer: >90 mL/min (ref >90)
GFR, EST NON AFRICAN AMERICAN: 88 mL/min — AB (ref >90)
Glucose, Bld: 155 mg/dL — ABNORMAL HIGH (ref 70–99)
Potassium: 3.5 mmol/L (ref 3.5–5.1)
SODIUM: 137 mmol/L (ref 135–145)

## 2014-06-11 LAB — CULTURE, BLOOD (ROUTINE X 2): Culture: NO GROWTH

## 2014-06-11 MED ORDER — LEVOFLOXACIN 500 MG PO TABS
500.0000 mg | ORAL_TABLET | Freq: Every day | ORAL | Status: DC
  Administered 2014-06-12 – 2014-06-18 (×7): 500 mg via ORAL
  Filled 2014-06-11 (×8): qty 1

## 2014-06-11 MED ORDER — TAMSULOSIN HCL 0.4 MG PO CAPS: 0.4000 mg | ORAL_CAPSULE | Freq: Every day | ORAL | Status: DC

## 2014-06-11 MED ORDER — SORBITOL 70 % SOLN: 30.0000 mL | Freq: Every day | Status: DC | PRN

## 2014-06-11 MED ORDER — FLORANEX PO PACK
1.0000 g | PACK | Freq: Three times a day (TID) | ORAL | Status: DC
  Administered 2014-06-12 – 2014-06-25 (×39): 1 g via ORAL
  Filled 2014-06-11 (×47): qty 1

## 2014-06-11 MED ORDER — ONDANSETRON HCL 4 MG/2ML IJ SOLN: 4.0000 mg | Freq: Four times a day (QID) | INTRAMUSCULAR | Status: DC | PRN

## 2014-06-11 MED ORDER — CETYLPYRIDINIUM CHLORIDE 0.05 % MT LIQD
7.0000 mL | Freq: Two times a day (BID) | OROMUCOSAL | Status: DC
  Administered 2014-06-12 – 2014-06-24 (×21): 7 mL via OROMUCOSAL

## 2014-06-11 MED ORDER — ATORVASTATIN CALCIUM 40 MG PO TABS: 40.0000 mg | ORAL_TABLET | Freq: Every day | ORAL | Status: DC

## 2014-06-11 MED ORDER — ATORVASTATIN CALCIUM 40 MG PO TABS
40.0000 mg | ORAL_TABLET | Freq: Every day | ORAL | Status: DC
  Administered 2014-06-11 – 2014-06-24 (×14): 40 mg via ORAL
  Filled 2014-06-11 (×15): qty 1

## 2014-06-11 MED ORDER — TAMSULOSIN HCL 0.4 MG PO CAPS
0.4000 mg | ORAL_CAPSULE | Freq: Every day | ORAL | Status: DC
  Administered 2014-06-12 – 2014-06-25 (×14): 0.4 mg via ORAL
  Filled 2014-06-11 (×15): qty 1

## 2014-06-11 MED ORDER — LEVOFLOXACIN 500 MG PO TABS
500.0000 mg | ORAL_TABLET | Freq: Every day | ORAL | Status: DC
  Administered 2014-06-11: 500 mg via ORAL
  Filled 2014-06-11 (×3): qty 1

## 2014-06-11 MED ORDER — PANTOPRAZOLE SODIUM 40 MG PO TBEC
40.0000 mg | DELAYED_RELEASE_TABLET | Freq: Every day | ORAL | Status: DC
  Administered 2014-06-11 – 2014-06-25 (×15): 40 mg via ORAL
  Filled 2014-06-11 (×16): qty 1

## 2014-06-11 MED ORDER — INSULIN ASPART 100 UNIT/ML ~~LOC~~ SOLN: 0.0000 [IU] | Freq: Three times a day (TID) | SUBCUTANEOUS | Status: DC

## 2014-06-11 MED ORDER — ACETAMINOPHEN 325 MG PO TABS: 325.0000 mg | ORAL_TABLET | ORAL | Status: DC | PRN

## 2014-06-11 MED ORDER — CHLORHEXIDINE GLUCONATE 0.12 % MT SOLN
15.0000 mL | Freq: Two times a day (BID) | OROMUCOSAL | Status: DC
  Administered 2014-06-11 – 2014-06-25 (×25): 15 mL via OROMUCOSAL
  Filled 2014-06-11 (×30): qty 15

## 2014-06-11 MED ORDER — ONDANSETRON HCL 4 MG PO TABS: 4.0000 mg | ORAL_TABLET | Freq: Four times a day (QID) | ORAL | Status: DC | PRN

## 2014-06-11 MED ORDER — LEVOFLOXACIN 500 MG PO TABS: 500.0000 mg | ORAL_TABLET | Freq: Every day | ORAL | Status: DC

## 2014-06-11 MED ORDER — INSULIN ASPART 100 UNIT/ML ~~LOC~~ SOLN
0.0000 [IU] | Freq: Three times a day (TID) | SUBCUTANEOUS | Status: DC
  Administered 2014-06-12 (×2): 3 [IU] via SUBCUTANEOUS
  Administered 2014-06-12: 2 [IU] via SUBCUTANEOUS
  Administered 2014-06-13: 5 [IU] via SUBCUTANEOUS
  Administered 2014-06-13 (×2): 3 [IU] via SUBCUTANEOUS
  Administered 2014-06-14: 5 [IU] via SUBCUTANEOUS
  Administered 2014-06-14 – 2014-06-15 (×3): 3 [IU] via SUBCUTANEOUS
  Administered 2014-06-15 (×2): 5 [IU] via SUBCUTANEOUS
  Administered 2014-06-16 (×2): 3 [IU] via SUBCUTANEOUS
  Administered 2014-06-16: 8 [IU] via SUBCUTANEOUS
  Administered 2014-06-17: 3 [IU] via SUBCUTANEOUS
  Administered 2014-06-17 – 2014-06-18 (×4): 5 [IU] via SUBCUTANEOUS
  Administered 2014-06-18: 3 [IU] via SUBCUTANEOUS
  Administered 2014-06-19: 8 [IU] via SUBCUTANEOUS
  Administered 2014-06-19 (×2): 3 [IU] via SUBCUTANEOUS
  Administered 2014-06-20: 8 [IU] via SUBCUTANEOUS
  Administered 2014-06-20 (×2): 5 [IU] via SUBCUTANEOUS
  Administered 2014-06-21: 8 [IU] via SUBCUTANEOUS
  Administered 2014-06-21 – 2014-06-22 (×5): 5 [IU] via SUBCUTANEOUS
  Administered 2014-06-23: 3 [IU] via SUBCUTANEOUS
  Administered 2014-06-23: 2 [IU] via SUBCUTANEOUS
  Administered 2014-06-23: 8 [IU] via SUBCUTANEOUS
  Administered 2014-06-24: 3 [IU] via SUBCUTANEOUS
  Administered 2014-06-24: 5 [IU] via SUBCUTANEOUS
  Administered 2014-06-24 – 2014-06-25 (×2): 3 [IU] via SUBCUTANEOUS

## 2014-06-11 NOTE — Significant Event (Addendum)
Patient discharged to inpatient rehab, room 18, taken via chair. Patient transferred there with intact foley catheter per Dr. Broadus John. All personal belongings taken with patient's spouse. Family at bedside. Report given to receiving dayshift RN. Patient settled in recliner chair per his requests-staff in room to receive him. Katalyna Socarras, RN.   1830-Patient and family given copy of AVS for information. Patient and family verbalized understanding of content.

## 2014-06-11 NOTE — H&P (Signed)
Physical Medicine and Rehabilitation Admission H&P   No chief complaint on file. : HPI: Jeffrey [name] is a 74 y.o. right handed male with history of remote tobacco abuse, hypertension, diabetes mellitus and peripheral neuropathy maintained on Invokana and has an insulin pump(Dr. Buddy Duty), coronary artery disease with balloon angioplasty. Independent prior to admission living with his wife. Presented 05/30/2014 with right sided weakness and slurred speech. Cranial CT scan showed acute left thalamic and left internal capsule parenchymal hemorrhage compatible with hypertensive hemorrhage. MRI again notes left thalamic hemorrhage showing some increased intraventricular and bilateral subarachnoid hemorrhage since recent cranial CT. MRA of the head was negative. Carotid Dopplers with no ICA stenosis. Echocardiogram with ejection fraction of 66% grade 1 diastolic dysfunction. Neurology follow-up with conservative care. Tolerating a regular consistency diet. Bouts of loose stool with C. difficile specimen 06/02/2014 negative. Physical therapy evaluation completed and ongoing with recommendations of physical medicine rehabilitation consult. Patient was admitted for a comprehensive rehabilitation program 06/03/2014 with therapies ongoing he was requiring total assist due to decreased standing balance. On 06/05/2014 patient with respiratory distress and oxygen saturations in the 80s and placed on NRB with pulmonary service consulted. Chest x-ray concerning for early pneumonia and placed on vancomycin as well as Zosyn. He was discharged to acute care services for ongoing workup. Mild elevation in troponin felt to be related to demand ischemia. Echocardiogram repeated with ejection fraction 59% grade 1 diastolic dysfunction. CT of abdomen revealed mild ileus a rectal tube had been placed no bowel obstruction noted. Repeat CT of the chest persistent lower lung field infiltrates atelectasis advised to encourage  incentive spirometer. His antibiotics have been changed to Levaquin as of 06/11/2014. Follow-up swallow study per speech therapy continues on regular diet. Bouts of urinary retention placed on Flomax as well as indwelling Foley catheter that would remain in place with plans to follow-up urology [name] as an outpatient. Therapies resumed a slow progressive gains. Patient is admitted for comprehensive rehabilitation program  ROS ROS Review of Systems  Gastrointestinal:   GERD  Genitourinary: Positive for frequency.  Musculoskeletal: Positive for myalgias.  Neurological: Positive for dizziness Remaining review of systems negative  Past Medical History  Diagnosis Date  . CAD (coronary artery disease)     PCI distal RCA ...2004, residual 70% LAD / ...nuclear...03/2007...no ischemia.Marland KitchenMarland Kitchenpreserved LV / nuclear...03/03/2009...inferior scar..no ischemia..EF 51%  . Dyslipidemia     takes Atorvastatin daily  . Internal hemorrhoids   . Gout     takes Allopurinol daily and Colchicine as needed  . Seasonal allergic rhinitis   . Right carotid bruit   . Cyst of nasopharynx     per ENT Wilburn Cornelia  . Myocardial infarction 36yr ago  . Peripheral edema     takes Lasix daily  . HTN (hypertension)     takes Cardura,Metoprolol,[name],and Amlodipine daily  . Arthritis   . GERD (gastroesophageal reflux disease)     takes Omeprazole daily  . History of colon polyps   . Type 2 diabetes, uncontrolled, with neuropathy 1989    takes Invokana daily and has an insulin pump ([name]  . 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 Cataract extraction Bilateral 2010  . Elbow surgery Right 1997  . Balloon angioplasty, artery  1992, 2004    CAD, Dr. KRon Parker . Colonoscopy  04/03/2002    adenomatous  polyp, int hemorrhoids  . Colonoscopy  07/18/2007    normal (Dr. Fuller Plan)  . Rotator cuff repair  09/2011    left, with subacromial decompression  . Nasal septum surgery    . Colonoscopy  09/26/2012    tubular adenoma, sm int hem, rpt 5 yrs Fuller Plan)  . Tonsillectomy and adenoidectomy    . Eye lids raised    . Cardiac catheterization  2004  . Polypectomy N/A 05/27/2013    Procedure: ENDOSCOPIC NASOPHARYNGEAL MASS; Surgeon: [name], MD   Family History  Problem Relation Age of Onset  . Alcohol abuse Father   . Coronary artery disease Neg Hx   . Stroke Neg Hx   . Cancer Neg Hx   . Diabetes Neg Hx   . Colon cancer Neg Hx    Social History:  reports that he has quit smoking. He has never used smokeless tobacco. He reports that he does not drink alcohol or use illicit drugs. Allergies:  Allergies  Allergen Reactions  . Niacin Other (See Comments)    REACTION: intolerant,not allergic="flushing,hot flashes,turning red"   Medications Prior to Admission  Medication Sig Dispense Refill  . meclizine (ANTIVERT) 25 MG tablet Take 1 tablet (25 mg total) by mouth 3 (three) times daily as needed for dizziness (watch for sedation). 20 tablet 0  . Canagliflozin (INVOKANA) 300 MG TABS Take 1 tablet (300 mg total) by mouth daily. 30 tablet   . fluticasone (FLONASE) 50 MCG/ACT nasal spray Place 2 sprays into both nostrils daily. 16 g 6  . Insulin Infusion Pump (H-TRON V100 INSULIN PUMP) MISC Patient uses Novolog in pump.      Home: Home Living Family/patient expects to be discharged to:: Inpatient rehab Living Arrangements: Spouse/significant other Available Help at Discharge: Family Type of Home: House Home Access: Stairs to enter [employer] of Steps: 3 Entrance Stairs-Rails: Right, Left, Can reach both Home Layout: One level Additional Comments: wife with recent  rotator cuff repair curently at [facility]. Will need to clarify home setup. Lives With: Spouse  Functional History: Prior Function Level of Independence: Independent Comments: Retired Geophysicist/field seismologist Status:  Mobility: Bed Mobility Overal bed mobility: Needs Assistance Bed Mobility: Supine to Sit Supine to sit: Mod assist, +2 for physical assistance General bed mobility comments: Pt with difficulty initiating movement with Rt side during bed mobility and demonstrated difficulty with motor planning. Required assist for LE movement towards EOB and for trunk elevation to full sitting position.  Transfers Overall transfer level: Needs assistance Equipment used: 2 person hand held assist Transfers: Sit to/from Stand, Stand Pivot Transfers Sit to Stand: Mod assist, +2 physical assistance Stand pivot transfers: Mod assist, +2 physical assistance Squat pivot transfers: Mod assist, +2 physical assistance General transfer comment: Pt transitioned to recliner chair to the L side. Was able to stand EOB x2 with R knee block required, however when taking pivotal steps around to the chair knee block was not required.  Ambulation/Gait Ambulation/Gait assistance: (not attempted; pt. not ready)    ADL: ADL Overall ADL's : Needs assistance/impaired Eating/Feeding: Modified independent, Bed level Grooming: Wash/dry hands, Wash/dry face, Oral care, Brushing hair, Min guard, Sitting Upper Body Bathing: Minimal assitance, Sitting Lower Body Bathing: Maximal assistance, Sit to/from stand Upper Body Dressing : Moderate assistance, Sitting Lower Body Dressing: Total assistance, Sit to/from stand Lower Body Dressing Details (indicate cue type and reason): Pt able to cross Lt ankle over Rt knee while seated EOB to attempt to doff sock, but unable to do so and required max A  Toilet Transfer: Moderate assistance, +2 for physical assistance, Squat-pivot Toileting- Clothing Manipulation and  Hygiene: Maximal assistance, Sit to/from stand Functional mobility during ADLs: Moderate assistance, +2 for physical assistance General ADL Comments: Pt reports dizziness as minimal.   Cognition: Cognition Overall Cognitive Status: Impaired/Different from baseline Orientation Level: Oriented X4 Cognition Arousal/Alertness: Awake/alert Behavior During Therapy: WFL for tasks assessed/performed Overall Cognitive Status: Impaired/Different from baseline Area of Impairment: Following commands, Problem solving Memory: Decreased short-term memory Following Commands: Follows one step commands consistently Safety/Judgement: Decreased awareness of safety, Decreased awareness of deficits Problem Solving: Slow processing, Decreased initiation, Difficulty sequencing, Requires verbal cues, Requires tactile cues  Physical Exam: Blood pressure 139/71, pulse 74, temperature 98.3 F (36.8 C), temperature source Oral, resp. rate 18, height 5' 8"  (1.727 m), weight 88.4 kg (194 lb 14.2 oz), SpO2 92 %. Physical Exam Constitutional: He is oriented to person, place, and time. He appears well-developed.  HENT: oral mucosa pink and moist Head: Normocephalic.  Eyes: EOM are normal.  Neck: Normal range of motion. Neck supple. No thyromegaly present.  Cardiovascular: Normal rate and regular rhythm. no murmur Respiratory: Effort normal and breath sounds normal. No respiratory distress.  GI: Soft. Bowel sounds are normal. He exhibits no distension.  Neurological: He is alert and oriented to person, place, and time.  Patient makes good eye contact with examiner. He is oriented to person, place and date of birth. Follow simple commands. improved focus and sustaining attention. Is aware why he's in the hospital. Remembered me from his last visit. Speech remains dysarthric. Has difficulties consistently initiating movement of the right arm more than the right leg. RUE: 3 to 3+/5 delt,bicep, tricep, HI. RLE 3+  to 4-/5 HF, KE ADF/APF but also still inconsistent. Sensation tr to1/2 RUE and 1 to 1+/2 RLE. LUE and LLE display fairly functional movement. Sensation appears intact to light touch and pain in the left arm and leg. Skin: Skin is warm and dry.  Psychiatric: He has a normal mood and affect. His behavior is normal    Lab Results Last 48 Hours    Results for orders placed or performed during the hospital encounter of 06/05/14 (from the past 48 hour(s))  Glucose, capillary Status: Abnormal   Collection Time: 06/09/14 11:25 AM  Result Value Ref Range   Glucose-Capillary 164 (H) 70 - 99 mg/dL  Glucose, capillary Status: Abnormal   Collection Time: 06/09/14 5:18 PM  Result Value Ref Range   Glucose-Capillary 149 (H) 70 - 99 mg/dL  Glucose, capillary Status: Abnormal   Collection Time: 06/09/14 8:42 PM  Result Value Ref Range   Glucose-Capillary 155 (H) 70 - 99 mg/dL  Glucose, capillary Status: Abnormal   Collection Time: 06/10/14 6:28 AM  Result Value Ref Range   Glucose-Capillary 143 (H) 70 - 99 mg/dL  Vancomycin, trough Status: None   Collection Time: 06/10/14 7:33 AM  Result Value Ref Range   Vancomycin Tr 10.8 10.0 - 20.0 ug/mL  Comprehensive metabolic panel Status: Abnormal   Collection Time: 06/10/14 7:33 AM  Result Value Ref Range   Sodium 139 135 - 145 mmol/L   Potassium 3.2 (L) 3.5 - 5.1 mmol/L   Chloride 106 96 - 112 mmol/L   CO2 27 19 - 32 mmol/L   Glucose, Bld 133 (H) 70 - 99 mg/dL   BUN 12 6 - 23 mg/dL   Creatinine, Ser 0.81 0.50 - 1.35 mg/dL   Calcium 8.3 (L) 8.4 - 10.5 mg/dL   Total Protein 5.3 (L) 6.0 -  8.3 g/dL   Albumin 2.3 (L) 3.5 - 5.2 g/dL   AST 17 0 - 37 U/L   ALT 24 0 - 53 U/L   Alkaline Phosphatase 93 39 - 117 U/L   Total Bilirubin 1.2 0.3 - 1.2 mg/dL   GFR calc non Af Amer 87 (L) >90 mL/min   GFR calc Af  Amer >90 >90 mL/min    Comment: (NOTE) The eGFR has been calculated using the CKD EPI equation. This calculation has not been validated in all clinical situations. eGFR's persistently <90 mL/min signify possible Chronic Kidney Disease.    Anion gap 6 5 - 15  Magnesium Status: None   Collection Time: 06/10/14 7:33 AM  Result Value Ref Range   Magnesium 2.0 1.5 - 2.5 mg/dL  Glucose, capillary Status: Abnormal   Collection Time: 06/10/14 11:57 AM  Result Value Ref Range   Glucose-Capillary 185 (H) 70 - 99 mg/dL  Glucose, capillary Status: Abnormal   Collection Time: 06/10/14 5:34 PM  Result Value Ref Range   Glucose-Capillary 146 (H) 70 - 99 mg/dL   Comment 1 Notify RN   Glucose, capillary Status: Abnormal   Collection Time: 06/10/14 9:06 PM  Result Value Ref Range   Glucose-Capillary 205 (H) 70 - 99 mg/dL  Basic metabolic panel Status: Abnormal   Collection Time: 06/11/14 5:31 AM  Result Value Ref Range   Sodium 137 135 - 145 mmol/L   Potassium 3.5 3.5 - 5.1 mmol/L   Chloride 103 96 - 112 mmol/L   CO2 26 19 - 32 mmol/L   Glucose, Bld 155 (H) 70 - 99 mg/dL   BUN 11 6 - 23 mg/dL   Creatinine, Ser 0.80 0.50 - 1.35 mg/dL   Calcium 8.5 8.4 - 10.5 mg/dL   GFR calc non Af Amer 88 (L) >90 mL/min   GFR calc Af Amer >90 >90 mL/min    Comment: (NOTE) The eGFR has been calculated using the CKD EPI equation. This calculation has not been validated in all clinical situations. eGFR's persistently <90 mL/min signify possible Chronic Kidney Disease.    Anion gap 8 5 - 15  Glucose, capillary Status: Abnormal   Collection Time: 06/11/14 6:26 AM  Result Value Ref Range   Glucose-Capillary 146 (H) 70 - 99 mg/dL      Imaging Results (Last 48 hours)    Dg Swallowing Func-speech Pathology  06/10/2014 Lorre Nick, CCC-SLP 06/10/2014 3:20  PM Objective Swallowing Evaluation: Modified Barium Swallowing Study Patient Details Name: KIPTON SKILLEN MRN: [id number] Date of Birth: 22-Mar-1940 Today's Date: 06/10/2014 Time: SLP Start Time (ACUTE ONLY): 1430-SLP Stop Time (ACUTE ONLY): 1500 SLP Time Calculation (min) (ACUTE ONLY): 30 min Past Medical History: Past Medical History Diagnosis Date . CAD (coronary artery disease) PCI distal RCA ...2004, residual 70% LAD / ...nuclear...03/2007...no ischemia.Marland KitchenMarland Kitchenpreserved LV / nuclear...03/03/2009...inferior scar..no ischemia..EF 51% . Dyslipidemia takes Atorvastatin daily . Internal hemorrhoids . Gout takes Allopurinol daily and Colchicine as needed . Seasonal allergic rhinitis . Right carotid bruit . Cyst of nasopharynx per ENT Wilburn Cornelia . Myocardial infarction 4yr ago . Peripheral edema takes Lasix daily . HTN (hypertension) takes Cardura,Metoprolol,[name],and Amlodipine daily . Arthritis . GERD (gastroesophageal reflux disease) takes Omeprazole daily . History of colon polyps . Type 2 diabetes, uncontrolled, with neuropathy 1989 takes Invokana daily and has an insulin pump ([name] . 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 Cataract extraction Bilateral 2010 .  Elbow surgery Right 1997 . Balloon angioplasty, artery 1992, 2004 CAD, Dr. Ron Parker . Colonoscopy 04/03/2002 adenomatous polyp, int hemorrhoids . Colonoscopy 07/18/2007 normal (Dr. Fuller Plan) . Rotator cuff repair 09/2011 left, with subacromial decompression . Nasal septum surgery . Colonoscopy 09/26/2012 tubular adenoma, sm int hem, rpt 5 yrs Fuller Plan) . Tonsillectomy and adenoidectomy . Eye lids raised . Cardiac catheterization 2004 . Polypectomy N/A 05/27/2013 Procedure: ENDOSCOPIC NASOPHARYNGEAL MASS; Surgeon: [name], MD HPI:  HPI: 74 year old male transferred 06/05/14 from [facility] (due to respiratory distress) after acute hospital stay 3/25-30/16 for left IPH. PMH significant for HTN, CAD, GERD, DM. BSE ordered to reevaluate swallow function and safety in light of recent CXR results indicating righ lower lobe opacity. No Data Recorded Assessment / Plan / Recommendation CHL IP CLINICAL IMPRESSIONS 06/10/2014 Dysphagia Diagnosis WFL Clinical impression Normal oropharyngeal swallow function. No penetration or aspiration observed with any consistency tested. Trace amount of backflow noted from esophagus to pharynx noted after puree, however, dry swallows effectively cleared pharynx. Esophageal sweep following barium tablet revealed no stasis or backflow. Recommend continuing regular diet with thin liquids. No further swallowing therapy recommended at this time. CHL IP TREATMENT RECOMMENDATION 06/10/2014 Treatment Plan Recommendations No treatment recommended at this time CHL IP DIET RECOMMENDATION 06/10/2014 Diet Recommendations Regular;Thin liquid Liquid Administration via Cup;Straw Medication Administration Whole meds with liquid Compensations Slow rate;Small sips/bites Postural Changes and/or Swallow Maneuvers Seated upright 90 degrees;Upright 30-60 min after meal CHL IP OTHER RECOMMENDATIONS 06/10/2014 Recommended Consults (None) Oral Care Recommendations Oral care BID Other Recommendations (None) CHL IP FOLLOW UP RECOMMENDATIONS 06/10/2014 Follow up Recommendations Inpatient Rehab CHL IP FREQUENCY AND DURATION 06/10/2014 Speech Therapy Frequency (ACUTE ONLY) (No Data) Treatment Duration (None) Pertinent Vitals/Pain No pain reported, VSS SLP Swallow Goals No flowsheet data found. No flowsheet data found. CHL IP REASON FOR REFERRAL 06/10/2014 Reason for Referral Objectively evaluate swallowing function CHL IP ORAL PHASE 06/10/2014 Lips (None) Tongue (None) Mucous membranes (None) Nutritional  status (None) Other (None) Oxygen therapy (None) Oral Phase WFL Oral - Pudding Teaspoon (None) Oral - Pudding Cup (None) Oral - Honey Teaspoon (None) Oral - Honey Cup (None) Oral - Honey Syringe (None) Oral - Nectar Teaspoon (None) Oral - Nectar Cup (None) Oral - Nectar Straw (None) Oral - Nectar Syringe (None) Oral - Ice Chips (None) Oral - Thin Teaspoon (None) Oral - Thin Cup (None) Oral - Thin Straw (None) Oral - Thin Syringe (None) Oral - Puree (None) Oral - Mechanical Soft (None) Oral - Regular (None) Oral - Multi-consistency (None) Oral - Pill (None) Oral Phase - Comment (None) CHL IP PHARYNGEAL PHASE 06/10/2014 Pharyngeal Phase WFL Pharyngeal - Pudding Teaspoon (None) Penetration/Aspiration details (pudding teaspoon) (None) Pharyngeal - Pudding Cup (None) Penetration/Aspiration details (pudding cup) (None) Pharyngeal - Honey Teaspoon (None) Penetration/Aspiration details (honey teaspoon) (None) Pharyngeal - Honey Cup (None) Penetration/Aspiration details (honey cup) (None) Pharyngeal - Honey Syringe (None) Penetration/Aspiration details (honey syringe) (None) Pharyngeal - Nectar Teaspoon (None) Penetration/Aspiration details (nectar teaspoon) (None) Pharyngeal - Nectar Cup (None) Penetration/Aspiration details (nectar cup) (None) Pharyngeal - Nectar Straw (None) Penetration/Aspiration details (nectar straw) (None) Pharyngeal - Nectar Syringe (None) Penetration/Aspiration details (nectar syringe) (None) Pharyngeal - Ice Chips (None) Penetration/Aspiration details (ice chips) (None) Pharyngeal - Thin Teaspoon (None) Penetration/Aspiration details (thin teaspoon) (None) Pharyngeal - Thin Cup (None) Penetration/Aspiration details (thin cup) (None) Pharyngeal - Thin Straw (None) Penetration/Aspiration details (thin straw) (None) Pharyngeal - Thin Syringe (None) Penetration/Aspiration details (thin syringe') (None) Pharyngeal - Puree (None)  Penetration/Aspiration details (puree) (None) Pharyngeal -  Mechanical Soft (None) Penetration/Aspiration details (mechanical soft) (None) Pharyngeal - Regular (None) Penetration/Aspiration details (regular) (None) Pharyngeal - Multi-consistency (None) Penetration/Aspiration details (multi-consistency) (None) Pharyngeal - Pill (None) Penetration/Aspiration details (pill) (None) Pharyngeal Comment (None) CHL IP CERVICAL ESOPHAGEAL PHASE 06/10/2014 Cervical Esophageal Phase Impaired Pudding Teaspoon (None) Pudding Cup (None) Honey Teaspoon (None) Honey Cup (None) Honey Syringe (None) Nectar Teaspoon (None) Nectar Cup (None) Nectar Straw (None) Nectar Syringe (None) Thin Teaspoon (None) Thin Cup (None) Thin Straw (None) Thin Syringe (None) Cervical Esophageal Comment (None) No flowsheet data found. Celia B. Quentin Ore [facility], CCC-SLP [phone] Shonna Chock 06/10/2014, 3:19 PM        Medical Problem List and Plan: 1. Functional deficits secondary to Left Thalamic ICH 2. DVT Prophylaxis/Anticoagulation: SCDs.Monitor for signs of DVT 3. Pain Management: Tylenol as needed 4. HCAP. Encourage incentive spirometer. Vancomycin and Zosyn discontinued and changed to Levaquin 06/11/2014. Check oxygen saturations every shift 5. Neuropsych: This patient is capable of making decisions on his own behalf. 6. Skin/Wound Care: Routine skin checks 7. Fluids/Electrolytes/Nutrition: Strict I and O's with follow-up chemistries 8. Mild ileus. Diet has been advanced to regular. No nausea vomiting. Establish bowel program 9. Diabetes mellitus and peripheral neuropathy. Latest hemoglobin A1c 7.8. Patient with history of insulin pump followed by Dr. Buddy Duty and endocrine services. Current sliding scale insulin 10. Coronary artery disease with balloon angioplasty. No chest pain or shortness of breath. 11. Urinary retention. Continue Flomax. Foley tube remain in place. Follow-up urology  services outpatient  Post Admission Physician Evaluation: 1. Functional deficits secondary to left thalamic ICH. 2. Patient is admitted to receive collaborative, interdisciplinary care between the physiatrist, rehab nursing staff, and therapy team. 3. Patient's level of medical complexity and substantial therapy needs in context of that medical necessity cannot be provided at a lesser intensity of care such as a SNF. 4. Patient has experienced substantial functional loss from his/her baseline which was documented above under the "Functional History" and "Functional Status" headings. Judging by the patient's diagnosis, physical exam, and functional history, the patient has potential for functional progress which will result in measurable gains while on inpatient rehab. These gains will be of substantial and practical use upon discharge in facilitating mobility and self-care at the household level. 5. Physiatrist will provide 24 hour management of medical needs as well as oversight of the therapy plan/treatment and provide guidance as appropriate regarding the interaction of the two. 6. 24 hour rehab nursing will assist with bladder management, bowel management, safety, skin/wound care, disease management, medication administration, pain management and patient education and help integrate therapy concepts, techniques,education, etc. 7. PT will assess and treat for/with: Lower extremity strength, range of motion, stamina, balance, functional mobility, safety, adaptive techniques and equipment, NMR, visual-spatial awareness, pain control, ego support, caregiver education. Goals are: supervision to min assist. 8. OT will assess and treat for/with: ADL's, functional mobility, safety, upper extremity strength, adaptive techniques and equipment, NMR, visual-spatial awareness, cognitive perceptual awareness, caregiver education. Goals are: supervision to min assist. Therapy may proceed with showering this  patient. 9. SLP will assess and treat for/with: cognition, communication, . Goals are: supervision to mod I. 10. Case Management and Social Worker will assess and treat for psychological issues and discharge planning. 11. Team conference will be held weekly to assess progress toward goals and to determine barriers to discharge. 12. Patient will receive at least 3 hours of therapy per day at least 5 days per week. 13. ELOS: 12-17 days  14. Prognosis: good  Meredith Staggers, MD, [facility] 06/11/2014

## 2014-06-11 NOTE — Progress Notes (Signed)
PMR Admission Coordinator Pre-Admission Assessment  Patient: Jeffrey [name] is an 74 y.o., male MRN: [id number] DOB: 12/20/1940 Height: 5\' 8"  (172.7 cm) Weight: 88.4 kg (194 lb 14.2 oz)  Insurance Information HMO: yes PPO: PCP: IPA: 80/20: OTHER: medicare advantage plan PRIMARY: Blue Medicare Policy#: [id number] Subscriber: pt CM Name: Cedric Phone#: [phone] Fax#: [phone] Pre-Cert#: [id number] cert 4/6 thru 4/70 when update due Employer: 06/03/14 Benefits: Phone #: [phone] Name: 06/03/14 Eff. Date: 03/07/14 Deduct: none Out of Pocket Max: $3950 Life Max: none CIR: $275 per day days 1-6 then covers 100% SNF: no copay days 1-20; $75 per day days 21-49; $100 copay per day days 50-100 Outpatient: $40 copay per visit Co-Pay: no visit limit Home Health: 100% Co-Pay: none DME: 80% Co-Pay: 20% Providers: in network  SECONDARY: none  Medicaid Application Date: Case Manager:  Disability Application Date: Case Worker:   Emergency Contact Information Contact Information    Name Relation Home Work [place] Spouse [phone]  [phone]   [facility] Daughter [phone]     [place], [place]   [phone]     Current Medical History  Patient Admitting Diagnosis: left thalamic ICH  History of Present Illness: Jeffrey [name] is a 74 y.o. right handed male with history of hypertension, diabetes mellitus and peripheral neuropathy, coronary artery disease with balloon angioplasty. Independent prior to admission living with his wife. Presented 05/30/2014 with right sided weakness and slurred speech. Cranial CT scan showed acute left thalamic and left internal capsule parenchymal hemorrhage compatible  with hypertensive hemorrhage. MRI again notes left thalamic hemorrhage showing some increased intraventricular and bilateral subarachnoid hemorrhage since recent cranial CT. MRA of the head was negative. Carotid Dopplers with no ICA stenosis. Echocardiogram with ejection fraction of 59% grade 1 diastolic dysfunction. Neurology follow-up with conservative care. Tolerating a regular consistency diet. Admitted to inpt rehab on 06/03/2014.  While admitted, patient became hypoxic, rapid respose was consulted, and pt placed on empiric antibiotics for suspected pneumonia. CXR showed bibasilar airspace disease related to atelectasis. CT of abdomen revealed mild ileus without bowel obstruction. The patient was discharged to acute hospital on 06/05/14.  PCCM felt acute hypoxic respiratory failure related to HCAP and sepsis related to HCAP. Treated with Vanc and Zosyn. Ileus and abdominal distention improved after flexiseal inserted while in CIR. Diet advanced and pt now tolerating regular diet with thin liquids as recommended by SLP. Pt now on 2 liters O2 via nasal cannula.  Difficulty with urinary retention with foley reinserted. Felt to be neurogenic bladder, discussed with urology Dr. Junious Silk, advised to change foley, and then change q 4 weeks, continue flomax, and follow up as an outpatient.   Troponin leak vs NSTEMI, troponin trended down. patient has significant history of CAD, ECHO no significant interval changes. Not a candidate for anticoagulation or antiplt. Consider outpatient cardiology followup.  Total: 6 NIH    Past Medical History  Past Medical History  Diagnosis Date  . CAD (coronary artery disease)     PCI distal RCA ...2004, residual 70% LAD / ...nuclear...03/2007...no ischemia.Marland KitchenMarland Kitchenpreserved LV / nuclear...03/03/2009...inferior scar..no ischemia..EF 51%  . Dyslipidemia     takes Atorvastatin daily  . Internal hemorrhoids   . Gout     takes Allopurinol daily and  Colchicine as needed  . Seasonal allergic rhinitis   . Right carotid bruit   . Cyst of nasopharynx     per ENT Wilburn Cornelia  . Myocardial infarction 75yrs ago  . Peripheral edema     takes Lasix  daily  . HTN (hypertension)     takes Cardura,Metoprolol,[name],and Amlodipine daily  . Arthritis   . GERD (gastroesophageal reflux disease)     takes Omeprazole daily  . History of colon polyps   . Type 2 diabetes, uncontrolled, with neuropathy 1989    takes Invokana daily and has an insulin pump (Dr. [name])  . 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 There is no history of Coronary artery disease, Stroke, Cancer, Diabetes, or Colon cancer.  Prior Rehab/Hospitalizations: CIR 06/03/14-06/05/14 when he returned to acute hospital  Current Medications   Current facility-administered medications:  . antiseptic oral rinse (CPC / CETYLPYRIDINIUM CHLORIDE 0.05%) solution 7 mL, 7 mL, Mouth Rinse, q12n4p, Chesley Mires, MD, 7 mL at 06/10/14 1700 . chlorhexidine (PERIDEX) 0.12 % solution 15 mL, 15 mL, Mouth Rinse, BID, Chesley Mires, MD, 15 mL at 06/10/14 2133 . folic acid (FOLVITE) tablet 1 mg, 1 mg, Oral, Daily, Chesley Mires, MD, 1 mg at 06/11/14 1005 . hydrALAZINE (APRESOLINE) injection 10-40 mg, 10-40 mg, Intravenous, Q4H PRN, Rahul P Desai, PA-C . insulin aspart (novoLOG) injection 0-15 Units, 0-15 Units, Subcutaneous, TID WC, Chesley Mires, MD, 2 Units at 06/11/14 0806 . insulin aspart (novoLOG) injection 0-5 Units, 0-5 Units, Subcutaneous, QHS, Chesley Mires, MD, 2 Units at 06/10/14 2132 . lactobacillus (FLORANEX/LACTINEX) granules 1 g, 1 g, Oral, TID WC, Florencia Reasons, MD, 1 g at 06/11/14 0806 . levofloxacin (LEVAQUIN) tablet 500 mg, 500 mg, Oral, Daily, Domenic Polite, MD . sodium chloride 0.9 % injection 3 mL, 3 mL, Intravenous,  Q12H, Samella Parr, NP, 3 mL at 06/10/14 1005 . tamsulosin (FLOMAX) capsule 0.4 mg, 0.4 mg, Oral, Daily, Florencia Reasons, MD, 0.4 mg at 06/11/14 1005 . thiamine (VITAMIN B-1) tablet 100 mg, 100 mg, Oral, Daily, Chesley Mires, MD, 100 mg at 06/11/14 1005  Patients Current Diet: Diet Carb Modified Fluid consistency:: Thin; Room service appropriate?: Yes  Precautions / Restrictions Precautions Precautions: Fall Precaution Comments: due to right hemiplegia, diplopia Restrictions Weight Bearing Restrictions: No   Prior Activity Level Community (5-7x/wk): retired Theme park manager, radio show, drives, very active   Development worker, international aid / [place] Devices/Equipment: Other (Comment) (insulin pump) Was using insulin pump pta. 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 He has poor proprioceptive awareness of Rt UE and reports it often moves around without his awareness. He has to rely on visual imput to move or use Rt UE. He is unable to grade force of movement. He demonstrates gross grasp and release and AAROM WFL. full hand to mouth actively  RUE Sensation: decreased light touch, decreased proprioception RUE Coordination: decreased fine motor, decreased gross motor  Lower Extremity Assessment: Defer to PT evaluation RLE Deficits / Details: weak proximally >distally with motor planning issues RLE Coordination: decreased fine motor    ADLs  Overall ADL's :  Needs assistance/impaired Eating/Feeding: Modified independent, Bed level Grooming: Wash/dry hands, Wash/dry face, Oral care, Brushing hair, Min guard, Sitting Upper Body Bathing: Minimal assitance, Sitting Lower Body Bathing:  Maximal assistance, Sit to/from stand Upper Body Dressing : Moderate assistance, Sitting Lower Body Dressing: Total assistance, Sit to/from stand Lower Body Dressing Details (indicate cue type and reason): Pt able to cross Lt ankle over Rt knee while seated EOB to attempt to doff sock, but unable to do so and required max A  Toilet Transfer: Moderate assistance, +2 for physical assistance, Squat-pivot Toileting- Clothing Manipulation and Hygiene: Maximal assistance, Sit to/from stand Functional mobility during ADLs: Moderate assistance, +2 for physical assistance General ADL Comments: Pt reports dizziness as minimal.     Mobility  Overal bed mobility: Needs Assistance Bed Mobility: Supine to Sit Supine to sit: Mod assist, +2 for physical assistance General bed mobility comments: Pt with difficulty initiating movement with Rt side during bed mobility and demonstrated difficulty with motor planning. Required assist for LE movement towards EOB and for trunk elevation to full sitting position.     Transfers  Overall transfer level: Needs assistance Equipment used: 2 person hand held assist Transfers: Sit to/from Stand, Stand Pivot Transfers Sit to Stand: Mod assist, +2 physical assistance Stand pivot transfers: Mod assist, +2 physical assistance Squat pivot transfers: Mod assist, +2 physical assistance General transfer comment: Pt transitioned to recliner chair to the L side. Was able to stand EOB x2 with R knee block required, however when taking pivotal steps around to the chair knee block was not required.     Ambulation / Gait / Stairs / Wheelchair Mobility  Ambulation/Gait Ambulation/Gait assistance: (not attempted; pt. not ready)    Posture /  Balance Dynamic Sitting Balance Sitting balance - Comments: pt. initially needing min guard assist for safe sitting at EOB, however progressed to supervision  Balance Overall balance assessment: Needs assistance Sitting-balance support: Feet supported, Bilateral upper extremity supported Sitting balance-Leahy Scale: Poor Sitting balance - Comments: pt. initially needing min guard assist for safe sitting at EOB, however progressed to supervision  Postural control: Left lateral lean Standing balance support: Bilateral upper extremity supported, During functional activity Standing balance-Leahy Scale: Zero Standing balance comment: +2 mod assist for standing position    Special needs/care consideration   Bowel mgmt: 06/10/14 continent Bladder mgmt: foley Diabetic mgmt yes. Was using insulin pump pta but not using in hospital O2 at 2 liters via nasal cannula   Previous Home Environment Living Arrangements: Spouse/significant other Lives With: Spouse Available Help at Discharge: Family Type of Home: House Home Layout: One level Home Access: Stairs to enter Entrance Stairs-Rails: Right, Left, Can reach both Entrance Stairs-Number of Steps: 3 Bathroom Shower/Tub: Chiropodist: Standard Bathroom Accessibility: Yes How Accessible: Accessible via walker [place]: No Additional Comments: wife with recent rotator cuff repair curently at [facility]. Will need to clarify home setup.  Discharge Living Setting Plans for Discharge Living Setting: Patient's home, Lives with (comment), Other (Comment) (spouse) Type of Home at Discharge: House Discharge Home Layout: One level Discharge Home Access: Stairs to enter Entrance Stairs-Rails: Right, Left, Can reach both Entrance Stairs-Number of Steps: 3 Discharge Bathroom Shower/Tub: Tub/shower unit, Curtain Discharge Bathroom Toilet: Standard Discharge Bathroom Accessibility:  Yes How Accessible: Accessible via walker Does the patient have any problems obtaining your medications?: No  Social/Family/Support Systems Patient Roles: Spouse, Parent, Other (Comment) Contact Information: Eran Mistry, wife [name], daughter and son, Jenny Reichmann Anticipated Caregiver: pt likely will SNF rehab for wife currently at [facility] after rotator cuff surgery Anticipated Caregiver's Contact Information: see above Ability/Limitations of Caregiver: Wife had recent rotator cuff surgery-recuperating at  [place] currently-will be limited in her assisting pt at discharge Caregiver Availability: Other (Comment) Discharge Plan Discussed with Primary Caregiver: Yes Is Caregiver In Agreement with Plan?: Yes Does Caregiver/Family have Issues with Lodging/Transportation while Pt is in Rehab?: No  Pt, his wife, daughter, and son aware that unless 24/7 supervision to min assist can be arranged, be will likely need SNF after CIR. Church friends bring wife up daily to visit after she receives her rehab at [facility]. She is ambulatory with poor balance and h/o falls. She does not have her d/c date from [facility] defined yet. Daughter graduating from med tech school in May and is a single Mom and can not do 24/7. Son from out of town and visits frequently and very supportive.  Goals/Additional Needs Patient/Family Goal for Rehab: supervision to min assist with PT, OT, and SLP Expected length of stay: ELOS 15- 18 days Pt/Family Agrees to Admission and willing to participate: Yes Program Orientation Provided & Reviewed with Pt/Caregiver Including Roles & Responsibilities: Yes   Decrease burden of Care through IP rehab admission: Decrease number of caregivers, Bowel and bladder program and Patient/family education, medical stabilization   Possible need for SNF placement upon discharge: very likely and family are aware and in agreement. Pt does have some VA benefits and son is checking into any  assistance they can provide eventually at home.  Patient Condition: This patient's medical and functional status has changed since the discharge on 06/05/14 to acute due to medical issues. See "History of Present Illness" (above) for medical update. Functional changes are: mod assist overall. Patient's medical and functional status update has been discussed with the Rehabilitation physician and patient remains appropriate for inpatient rehabilitation. Will admit to inpatient rehab today.  Preadmission Screen Completed By: Cleatrice Burke, 06/11/2014 10:07 AM ______________________________________________________________________  Discussed status with Dr. Naaman Plummer on 06/11/2014 at 20 and received telephone approval for admission today.  Admission Coordinator: Cleatrice Burke, time 1006 Date 06/11/2014.          Cosigned by: Meredith Staggers, MD at 06/11/2014 10:12 AM  Revision History     Date/Time User Provider Type Action   06/11/2014 10:12 AM Meredith Staggers, MD Physician Cosign   06/11/2014 10:07 AM [name], RN Rehab Admission Coordinator Sign

## 2014-06-11 NOTE — Progress Notes (Signed)
Meredith Staggers, MD Physician Signed Physical Medicine and Rehabilitation Consult Note 06/02/2014 6:11 AM  Related encounter: ED to Hosp-Admission (Discharged) from 05/30/2014 in [facility] Collapse All        Physical Medicine and Rehabilitation Consult Reason for Consult: Acute left thalamic and left internal capsule parenchymal hemorrhage Referring Physician: Dr. Leonie Man   HPI: Jeffrey [name] is a 74 y.o. right handed male with history of hypertension, diabetes mellitus and peripheral neuropathy, coronary artery disease with balloon angioplasty. Independent prior to admission living with his wife. Presented 05/30/2014 with right sided weakness and slurred speech. Cranial CT scan showed acute left thalamic and left internal capsule parenchymal hemorrhage compatible with hypertensive hemorrhage. MRI again notes left thalamic hemorrhage showing some increased intraventricular and bilateral subarachnoid hemorrhage since recent cranial CT. MRA of the head was negative. Carotid Dopplers with no ICA stenosis. Echocardiogram with ejection fraction of 15% grade 1 diastolic dysfunction. Neurology follow-up with conservative care. Tolerating a regular consistency diet. Bouts of loose stool with C. difficile specimen pending. Physical therapy evaluation completed and ongoing with recommendations of physical medicine rehabilitation consult.   Review of Systems  Gastrointestinal:   GERD  Genitourinary: Positive for frequency.  Musculoskeletal: Positive for myalgias.  Neurological: Positive for dizziness.   Past Medical History  Diagnosis Date  . CAD (coronary artery disease)     PCI distal RCA ...2004, residual 70% LAD / ...nuclear...03/2007...no ischemia.Marland KitchenMarland Kitchenpreserved LV / nuclear...03/03/2009...inferior scar..no ischemia..EF 51%  . Dyslipidemia     takes Atorvastatin daily  . Internal hemorrhoids   . Gout      takes Allopurinol daily and Colchicine as needed  . Seasonal allergic rhinitis   . Right carotid bruit   . Cyst of nasopharynx     per ENT Wilburn Cornelia  . Myocardial infarction 42yrs ago  . Peripheral edema     takes Lasix daily  . HTN (hypertension)     takes Cardura,Metoprolol,[name],and Amlodipine daily  . Arthritis   . GERD (gastroesophageal reflux disease)     takes Omeprazole daily  . History of colon polyps   . Type 2 diabetes, uncontrolled, with neuropathy 1989    takes Invokana daily and has an insulin pump (Dr. [name])  . 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 Cataract extraction Bilateral 2010  . Elbow surgery Right 1997  . Balloon angioplasty, artery  1992, 2004    CAD, Dr. Ron Parker  . Colonoscopy  04/03/2002    adenomatous polyp, int hemorrhoids  . Colonoscopy  07/18/2007    normal (Dr. Fuller Plan)  . Rotator cuff repair  09/2011    left, with subacromial decompression  . Nasal septum surgery    . Colonoscopy  09/26/2012    tubular adenoma, sm int hem, rpt 5 yrs Fuller Plan)  . Tonsillectomy and adenoidectomy    . Eye lids raised    . Cardiac catheterization  2004  . Polypectomy N/A 05/27/2013    Procedure: ENDOSCOPIC NASOPHARYNGEAL MASS; Surgeon: [name], MD   Family History  Problem Relation Age of Onset  . Alcohol abuse Father   . Coronary artery disease Neg Hx   . Stroke Neg Hx   . Cancer Neg Hx   . Diabetes Neg Hx   . Colon cancer Neg Hx    Social History:  reports that he has quit smoking. He has never  used smokeless tobacco. He reports that he does not drink alcohol or use illicit drugs. Allergies:  Allergies  Allergen Reactions  . Niacin Other (See Comments)    REACTION: intolerant,not  allergic="flushing,hot flashes,turning red"   Medications Prior to Admission  Medication Sig Dispense Refill  . Canagliflozin (INVOKANA) 300 MG TABS Take 1 tablet (300 mg total) by mouth daily. 30 tablet   . fluticasone (FLONASE) 50 MCG/ACT nasal spray Place 2 sprays into both nostrils daily. 16 g 6  . Insulin Infusion Pump (H-TRON V100 INSULIN PUMP) MISC Patient uses Novolog in pump.    . meclizine (ANTIVERT) 25 MG tablet Take 1 tablet (25 mg total) by mouth 3 (three) times daily as needed for dizziness (watch for sedation). 20 tablet 0    Home: Home Living Family/patient expects to be discharged to:: Inpatient rehab Living Arrangements: Alone, Spouse/significant other Available Help at Discharge: Family  Functional History: Prior Function Level of Independence: Independent Comments: Retired Tax adviser Status:  Mobility: Bed Mobility Overal bed mobility: Needs Assistance Bed Mobility: Rolling, Supine to Sit Rolling: Mod assist Supine to sit: Max assist Transfers Overall transfer level: Needs assistance Equipment used: 1 person hand held assist Transfers: Sit to/from Stand, Stand Pivot Transfers Sit to Stand: Mod assist, Max assist Stand pivot transfers: +2 physical assistance (not tested on evaluation) General transfer comment: Trial of RW, with R hand supported on walker, better with HHA      ADL:    Cognition: Cognition Overall Cognitive Status: Within Functional Limits for tasks assessed Orientation Level: Oriented X4 Cognition Arousal/Alertness: Awake/alert Behavior During Therapy: WFL for tasks assessed/performed Overall Cognitive Status: Within Functional Limits for tasks assessed  Blood pressure 149/76, pulse 76, temperature 98.4 F (36.9 C), temperature source Oral, resp. rate 18, weight 89.9 kg (198 lb 3.1 oz), SpO2 97 %. Physical Exam  Vitals reviewed. Constitutional: He is oriented to person, place, and time. He  appears well-developed.  HENT:  Head: Normocephalic.  Eyes: EOM are normal.  Neck: Normal range of motion. Neck supple. No thyromegaly present.  Cardiovascular: Normal rate and regular rhythm.  Respiratory: Effort normal and breath sounds normal. No respiratory distress.  GI: Soft. Bowel sounds are normal. He exhibits no distension.  Neurological: He is alert and oriented to person, place, and time.  Patient makes good eye contact with examiner. He is oriented to person, place and date of birth. Follow simple commands. Speech dysarthric. Has difficulties consistently initiating movement of the right arm more than the right leg. RUE: 3 to 3+/5. RLE 3+ to 4/5 but inconsistent. Sensation tr/2 RUE and 1+/2 RLE.  Skin: Skin is warm and dry.  Psychiatric: He has a normal mood and affect. His behavior is normal.     Lab Results Last 24 Hours    Results for orders placed or performed during the hospital encounter of 05/30/14 (from the past 24 hour(s))  Glucose, capillary Status: Abnormal   Collection Time: 06/01/14 6:35 AM  Result Value Ref Range   Glucose-Capillary 121 (H) 70 - 99 mg/dL   Comment 1 Notify RN    Comment 2 Document in Chart   Glucose, capillary Status: Abnormal   Collection Time: 06/01/14 11:40 AM  Result Value Ref Range   Glucose-Capillary 125 (H) 70 - 99 mg/dL  Glucose, capillary Status: Abnormal   Collection Time: 06/01/14 4:38 PM  Result Value Ref Range   Glucose-Capillary 155 (H) 70 - 99 mg/dL  Glucose, capillary Status: Abnormal   Collection Time: 06/01/14 10:32 PM  Result Value Ref Range   Glucose-Capillary 148 (H) 70 - 99 mg/dL      Imaging Results (Last 48 hours)    Mr Virgel Paling Wo Contrast  05/31/2014 CLINICAL DATA: 74 year old male with found down, left thalamic hemorrhage diagnosed on CT. Initial encounter. EXAM: MRI HEAD WITHOUT CONTRAST MRA HEAD WITHOUT CONTRAST TECHNIQUE:  Multiplanar, multiecho pulse sequences of the brain and surrounding structures were obtained without intravenous contrast. Angiographic images of the head were obtained using MRA technique without contrast. COMPARISON: Head CT without contrast 05/30/2014. FINDINGS: MRI HEAD FINDINGS T2 weighted heterogeneous and T1 weighted mostly isointense blood products re - identified in the dorsal left thalamus, encompassing 19 x 30 x 21 mm (AP by transverse by CC), for an estimated blood volume of 6 mL. Edema and secondary mass effect around the thalamic hemorrhage have not significantly changed. Edema does track into the left midbrain. The diffusion signal abnormality present does primarily correspond to the hemorrhage, but there is restricted diffusion in the posterior left corona radiata which does not definitely correspond to blood products (series 4, image 30). No other restricted diffusion identified. Intraventricular extension re- identified and has increased. No ventriculomegaly. There is bilateral subarachnoid hemorrhage which is new or increased (bilateral sylvian fissures series 7, image 15, bilateral occipital sulci image 10, and superior cerebellar folia image 10). No subdural or extradural blood identified. Basilar cisterns remain patent. Major intracranial vascular flow voids are preserved. Elsewhere there is scattered mild to moderate for age nonspecific cerebral white matter T2 and FLAIR hyperintensity. No cortical encephalomalacia identified. Right deep gray matter nuclei are within normal limits. Visible internal auditory structures appear normal. Mastoids are clear. Trace paranasal sinus mucosal thickening. Postoperative changes to the globes. Negative pituitary, cervicomedullary junction and visualized cervical spine. Normal bone marrow signal. Visualized scalp soft tissues are within normal limits. MRA HEAD FINDINGS Antegrade flow in the posterior circulation with dominant distal left vertebral  artery. Patent PICA origins. Patent vertebrobasilar junction. No basilar stenosis. SCA and PCA origins are normal. Posterior communicating arteries are diminutive or absent. Bilateral PCA branches including the left P1 segment are within normal limits. Antegrade flow in both ICA siphons no siphon stenosis. Normal ophthalmic artery origins. Normal carotid termini. Normal MCA and ACA origins. Mildly dominant left A1 segment. Anterior communicating artery and visualized bilateral ACA branches are within normal limits. Visualized bilateral MCA branches are within normal limits. IMPRESSION: 1. Left thalamic hemorrhage probably represents hemorrhagic transformation of a left thalamic and posterior corona radiata infarct. Intra-axial hemorrhage, edema, and mild mass effect have not significantly changed. 2. Increased intraventricular and bilateral subarachnoid hemorrhage since yesterday. No ventriculomegaly. 3. Negative intracranial MRA. 4. Underlying mild for age nonspecific signal changes compatible with chronic small vessel disease. Study discussed by telephone with Neurology Dr. Wallie Char on 05/31/2014 at 1626 hours. Electronically Signed By: Genevie Ann M.D. On: 05/31/2014 16:29   Mr Brain Wo Contrast  05/31/2014 CLINICAL DATA: 74 year old male with found down, left thalamic hemorrhage diagnosed on CT. Initial encounter. EXAM: MRI HEAD WITHOUT CONTRAST MRA HEAD WITHOUT CONTRAST TECHNIQUE: Multiplanar, multiecho pulse sequences of the brain and surrounding structures were obtained without intravenous contrast. Angiographic images of the head were obtained using MRA technique without contrast. COMPARISON: Head CT without contrast 05/30/2014. FINDINGS: MRI HEAD FINDINGS T2 weighted heterogeneous and T1 weighted mostly isointense blood products re - identified in the dorsal left thalamus, encompassing 19 x 30 x 21 mm (AP by transverse by CC), for an estimated blood volume of 6  mL. Edema and  secondary mass effect around the thalamic hemorrhage have not significantly changed. Edema does track into the left midbrain. The diffusion signal abnormality present does primarily correspond to the hemorrhage, but there is restricted diffusion in the posterior left corona radiata which does not definitely correspond to blood products (series 4, image 30). No other restricted diffusion identified. Intraventricular extension re- identified and has increased. No ventriculomegaly. There is bilateral subarachnoid hemorrhage which is new or increased (bilateral sylvian fissures series 7, image 15, bilateral occipital sulci image 10, and superior cerebellar folia image 10). No subdural or extradural blood identified. Basilar cisterns remain patent. Major intracranial vascular flow voids are preserved. Elsewhere there is scattered mild to moderate for age nonspecific cerebral white matter T2 and FLAIR hyperintensity. No cortical encephalomalacia identified. Right deep gray matter nuclei are within normal limits. Visible internal auditory structures appear normal. Mastoids are clear. Trace paranasal sinus mucosal thickening. Postoperative changes to the globes. Negative pituitary, cervicomedullary junction and visualized cervical spine. Normal bone marrow signal. Visualized scalp soft tissues are within normal limits. MRA HEAD FINDINGS Antegrade flow in the posterior circulation with dominant distal left vertebral artery. Patent PICA origins. Patent vertebrobasilar junction. No basilar stenosis. SCA and PCA origins are normal. Posterior communicating arteries are diminutive or absent. Bilateral PCA branches including the left P1 segment are within normal limits. Antegrade flow in both ICA siphons no siphon stenosis. Normal ophthalmic artery origins. Normal carotid termini. Normal MCA and ACA origins. Mildly dominant left A1 segment. Anterior communicating artery and visualized bilateral ACA branches are within  normal limits. Visualized bilateral MCA branches are within normal limits. IMPRESSION: 1. Left thalamic hemorrhage probably represents hemorrhagic transformation of a left thalamic and posterior corona radiata infarct. Intra-axial hemorrhage, edema, and mild mass effect have not significantly changed. 2. Increased intraventricular and bilateral subarachnoid hemorrhage since yesterday. No ventriculomegaly. 3. Negative intracranial MRA. 4. Underlying mild for age nonspecific signal changes compatible with chronic small vessel disease. Study discussed by telephone with Neurology Dr. Wallie Char on 05/31/2014 at 1626 hours. Electronically Signed By: Genevie Ann M.D. On: 05/31/2014 16:29     Assessment/Plan: Diagnosis: left thalamic ICH 1. Does the need for close, 24 hr/day medical supervision in concert with the patient's rehab needs make it unreasonable for this patient to be served in a less intensive setting? Yes 2. Co-Morbidities requiring supervision/potential complications: htn, DM2, gout  3. Due to bladder management, bowel management, safety, skin/wound care, disease management, medication administration, pain management and patient education, does the patient require 24 hr/day rehab nursing? Yes 4. Does the patient require coordinated care of a physician, rehab nurse, PT (1-2 hrs/day, 5 days/week), OT (1-2 hrs/day, 5 days/week) and SLP (1-2 hrs/day, 5 days/week) to address physical and functional deficits in the context of the above medical diagnosis(es)? Yes Addressing deficits in the following areas: balance, endurance, locomotion, strength, transferring, bowel/bladder control, bathing, dressing, feeding, grooming, toileting, speech and psychosocial support 5. Can the patient actively participate in an intensive therapy program of at least 3 hrs of therapy per day at least 5 days per week? Yes 6. The potential for patient to make measurable gains while on inpatient rehab is  excellent 7. Anticipated functional outcomes upon discharge from inpatient rehab are supervision and min assist with PT, supervision and min assist with OT, modified independent and supervision with SLP. 8. Estimated rehab length of stay to reach the above functional goals is: 15-18 days 9. Does the patient have adequate social supports and living  environment to accommodate these discharge functional goals? Yes 10. Anticipated D/C setting: Home 11. Anticipated post D/C treatments: HH therapy and Outpatient therapy 12. Overall Rehab/Functional Prognosis: excellent  RECOMMENDATIONS: This patient's condition is appropriate for continued rehabilitative care in the following setting: CIR Patient has agreed to participate in recommended program. Yes Note that insurance prior authorization may be required for reimbursement for recommended care.  Comment: Rehab Admissions Coordinator to follow up.  Thanks,  Meredith Staggers, MD, Mellody Drown     06/02/2014       Revision History     Date/Time User Provider Type Action   06/02/2014 11:00 AM Meredith Staggers, MD Physician Sign   06/02/2014 6:59 AM Cathlyn Parsons, PA-C Physician Assistant Pend   View Details Report       Routing History     Date/Time From To Method   06/02/2014 11:00 AM Meredith Staggers, MD Meredith Staggers, MD In Basket   06/02/2014 11:00 AM Meredith Staggers, MD [name], MD In [facility]

## 2014-06-11 NOTE — Discharge Summary (Signed)
Physician Discharge Summary  Jeffrey [name] TDV:[id number] DOB: 1941-01-29 DOA: 06/05/2014  PCP: [name], MD  Admit date: 06/05/2014 Discharge date: 06/11/2014  Time spent:45 minutes  Recommendations for Outpatient Follow-up:  1. Outpatient Cardiology FU 2. Needs Fu CXr in 4-6weeks to ensure resolution 3. Outpatient FU with [name] in 2-3weeks 4. Needs Foley changed q4weeks  Discharge Diagnoses:  Principal Problem:   Sepsis Active Problems:   Essential hypertension   Hyperlipidemia   Type 2 diabetes, uncontrolled, with neuropathy   CAD (coronary artery disease)   Nontraumatic intracranial hemorrhage   Acute respiratory failure with hypoxia   HCAP (healthcare-associated pneumonia)   Adynamic ileus   Thrombocytopenia   Acute renal failure   Chronic diastolic heart failure, NYHA class 1   Demand Ischemia  Discharge Condition: stable  Diet recommendation: DM  Filed Weights   06/09/14 0352 06/10/14 0417 06/11/14 0452  Weight: 89.5 kg (197 lb 5 oz) 88.9 kg (195 lb 15.8 oz) 88.4 kg (194 lb 14.2 oz)    History of present illness:   74 y.o. M initially brought to [facility] ED 3/25 with right sided weakness and slurred speech due to left sided Intra parenchymalPH with IV extension and bilateral SAH. Discharged to [facility] 3/30. 3/31, had respiratory distress so admitted by PCCM to ICU.  Hospital Co urse:  HCAP/sepsis:  -was admitted to ICU by PCCM, initially and then transferred to medical floor -started onVanc/zosyn from admission, Improving -weaned Off oxygen. Leukocytosis resolved. -changed to PO levaquin -all cultures negative -Repeat CT chest, persistent lower lung fields infiltrates/ atelectasis, encourage incentive spirometer.  -speech eval completed, regular diet recommended  Urinary retention: - likely neurogenic bladder, discussed with urology Dr. Junious Silk, advised to continue foley, change foley q4wks, continue flomax, outpatient follow up with him in three  weeks.  Ileus:  -resolved, treated with supportive care only -now tolerating diet, no further N/V or abd pain -had Bm this am.   Troponin leak/demand ischemia due to Sepsis -troponin leaK noted while in ICU under PCCM service,  troponin trended down - patient has significant history of CAD, ECHO no significant interval changes. Not a candidate for anticoagulation or antiplt due to recent intracranial bleed. -Outpatient cardiology followup recommended  H/o Acute left sided IPH with intraventricular extension and bilateral SAH Avoid anticoagulation/antiplt. bp control/pt/ot/speech. Residual Right sided weakness. CIR placement.  IDDM2,  -continue insulin SSI   Discharge Exam: Filed Vitals:   06/11/14 0452  BP: 139/71  Pulse: 74  Temp: 98.3 F (36.8 C)  Resp: 18    General: AAOx3 Cardiovascular: S1S2/RRR Respiratory: CTAB  Discharge Instructions    Current Discharge Medication List    START taking these medications   Details  insulin aspart (NOVOLOG) 100 UNIT/ML injection Inject 0-15 Units into the skin 3 (three) times daily with meals. Qty: 10 mL, Refills: 11    levofloxacin (LEVAQUIN) 500 MG tablet Take 1 tablet (500 mg total) by mouth daily. For 4 days    tamsulosin (FLOMAX) 0.4 MG CAPS capsule Take 1 capsule (0.4 mg total) by mouth daily. Qty: 30 capsule      CONTINUE these medications which have NOT CHANGED   Details  meclizine (ANTIVERT) 25 MG tablet Take 1 tablet (25 mg total) by mouth 3 (three) times daily as needed for dizziness (watch for sedation). Qty: 20 tablet, Refills: 0    fluticasone (FLONASE) 50 MCG/ACT nasal spray Place 2 sprays into both nostrils daily. Qty: 16 g, Refills: 6      STOP taking  these medications     Canagliflozin (INVOKANA) 300 MG TABS      Insulin Infusion Pump (H-TRON V100 INSULIN PUMP) MISC        Allergies  Allergen Reactions  . Niacin Other (See Comments)    REACTION: intolerant,not allergic="flushing,hot  flashes,turning red"   Follow-up Information    Follow up with ESKRIDGE, MATTHEW, MD In 3 weeks.   Specialty:  Urology   Why:  urinary retention, indwelling foley   Contact information:   [place] [address] [phone]        The results of significant diagnostics from this hospitalization (including imaging, microbiology, ancillary and laboratory) are listed below for reference.    Significant Diagnostic Studies: Ct Abdomen Pelvis Wo Contrast  06/05/2014   CLINICAL DATA:  Abdominal distention with low-grade fever. Rectal tube placed.  EXAM: CT ABDOMEN AND PELVIS WITHOUT CONTRAST  TECHNIQUE: Multidetector CT imaging of the abdomen and pelvis was performed following the standard protocol without IV contrast.  COMPARISON:  Chest radiograph earlier today.  FINDINGS: RIGHT lower lobe pneumonia with moderate-sized effusion. Trace LEFT pleural effusion. RIGHT coronary artery calcification. The pulmonary abnormality is difficult to visualize on chest radiograph  Normal liver, spleen, and adrenal glands. Cholelithiasis without biliary ductal dilatation. Atrophic pancreas. No renal obstruction or calculi. No adrenal enlargement. 4 cm RIGHT lower pole renal cyst. Partial malrotation both kidneys. No bowel obstruction. Nonspecific small and large bowel prominence representing ileus. No adenopathy. Appendix not definitely identified but there is no appendiceal inflammation. No osseous lesions. Rectal tube. Prostatic calcification. Non aneurysmal atherosclerotic calcification of the aorta.  IMPRESSION: RIGHT lower lobe pneumonia with moderate-sized pleural effusion.  Gallstones.  No signs of acute cholecystitis.  Mild ileus without bowel obstruction.  Rectal tube good position.   Electronically Signed   By: Rolla Flatten M.D.   On: 06/05/2014 13:17   Dg Chest 2 View  06/07/2014   CLINICAL DATA:  Healthcare associated pneumonia.  Follow-up exam.  EXAM: CHEST  2 VIEW  COMPARISON:  06/05/2014   FINDINGS: Right lung base opacity is consistent with a combination of a small effusion and either atelectasis, pneumonia or a combination. This is similar to the prior chest radiograph. Remainder of the lungs is clear.  No pneumothorax. Cardiac silhouette is normal in size. No mediastinal or hilar masses or convincing adenopathy.  IMPRESSION: 1. Mild persistent right lung base opacity. This may be due to pneumonia with associated pleural effusion. It could all be due to atelectasis and pleural fluid. 2. No new lung abnormalities.  No evidence of pulmonary edema.   Electronically Signed   By: Lajean Manes M.D.   On: 06/07/2014 08:38   Ct Head (brain) Wo Contrast  05/30/2014   CLINICAL DATA:  Patient found down.  Possible stroke.  Code stroke.  EXAM: CT HEAD WITHOUT CONTRAST  TECHNIQUE: Contiguous axial images were obtained from the base of the skull through the vertex without intravenous contrast.  COMPARISON:  05/14/2013.  FINDINGS: There is an acute LEFT thalamic hemorrhage extending to the LEFT internal capsule and there is intraventricular break through, with blood in both lateral ventricles, greater on the LEFT than RIGHT. No hydrocephalus. The parenchymal portion of the hemorrhage measures 26 mm transverse by 13 mm AP. This is present on 6 contiguous images with estimated 30 mm craniocaudal dimension. Tiny amount of blood is also present in the third ventricle and fourth ventricle.  Bilateral lens extractions are present. Chronic dystrophic calcification is present in  the RIGHT cerebellar hemisphere, unchanged compared to 05/14/2013. Gray-white differentiation is preserved. Calvarium intact. Paranasal sinuses and mastoid air cells normal. Mild motion artifact is present, most evident on the bone windows. Scout images appear within normal limits.  IMPRESSION: 1. Acute LEFT thalamic and LEFT internal capsule parenchymal hemorrhage compatible with hypertensive hemorrhage. Intraventricular break through, with  blood in both lateral ventricles, third and fourth ventricles. 2. No hydrocephalus. 3. Critical Value/emergent results were called by telephone at the time of interpretation on 05/30/2014 at 7:33 am to Dr. Nicole Kindred, who verbally acknowledged these results.   Electronically Signed   By: Dereck Ligas M.D.   On: 05/30/2014 07:34   Ct Chest Wo Contrast  06/09/2014   CLINICAL DATA:  Pneumonia  EXAM: CT CHEST WITHOUT CONTRAST  TECHNIQUE: Multidetector CT imaging of the chest was performed following the standard protocol without IV contrast.  COMPARISON:  Abdominal CT from 06/05/2014  FINDINGS: THORACIC INLET/BODY WALL:  Incidental sub cm nodules in the bilateral thyroid gland.  MEDIASTINUM:  Normal heart size. No pericardial effusion. Diffuse atherosclerosis, including the coronary arteries. No acute vascular abnormality. No adenopathy.  LUNG WINDOWS:  Predominantly ground-glass opacity in the right lower lobe is unchanged. There is also new ground-glass density in the left lower lobe. There is some volume loss, but the opacity is more extensive than expected for simple atelectasis. Trace pleural fluid present bilaterally. No cavitation. No edema in the non-opacified lungs.  UPPER ABDOMEN:  Cholelithiasis.  No evidence of inflammation.  OSSEOUS:  No acute fracture.  No suspicious lytic or blastic lesions.  IMPRESSION: 1. Bilateral lower lobe opacification, likely combination atelectasis and pneumonia. The opacity is new on the left since 06/05/2014. 2. Cholelithiasis.   Electronically Signed   By: Monte Fantasia M.D.   On: 06/09/2014 02:25   Ct Cervical Spine Wo Contrast  05/30/2014   CLINICAL DATA:  Code stroke. Left alignment hemorrhage. Found down.  EXAM: CT CERVICAL SPINE WITHOUT CONTRAST  TECHNIQUE: Multidetector CT imaging of the cervical spine was performed without intravenous contrast. Multiplanar CT image reconstructions were also generated.  COMPARISON:  CT of the neck 05/14/2013  FINDINGS: Cervical spine  is imaged from the skullbase through T2-3. There is calcification in the disc and fusion across the endplates at V3-7 and T0-6. The posterior elements are fused at these levels as well. Slight degenerative anterolisthesis is present at C4-5. More prominent chronic endplate changes are noted at C5-6 and C6-7 with asymmetric left-sided uncovertebral spurring an osseous foraminal narrowing at these levels.  The previously noted nasopharyngeal nodule is no longer present. And 8 mm hypodense lesion in the right lobe of the thyroid was not evident on the postcontrast images previously. There is some hypoattenuation in the left lobe of the thyroid as well. Atherosclerotic calcifications are present at the aortic arch and to lesser extent carotid bifurcations.  IMPRESSION: 1. No acute fracture or traumatic subluxation. 2. Multilevel spondylosis of the cervical spine. 3. Anterior and posterior fusion at C2-3 and C3-4 is likely acquired. 4. Osseous foramina are scratch the osseous foraminal narrowing is most pronounced on the left at C5-6 and C6-7. 5. Bilateral sub cm thyroid nodules. Sub-centimeter thyroid nodule(s) noted, too small to characterize, but most likely benign in the absence of known clinical risk factors for thyroid carcinoma.   Electronically Signed   By: San Morelle M.D.   On: 05/30/2014 08:35   Mr Virgel Paling Wo Contrast  05/31/2014   CLINICAL DATA:  74 year old male with found  down, left thalamic hemorrhage diagnosed on CT. Initial encounter.  EXAM: MRI HEAD WITHOUT CONTRAST  MRA HEAD WITHOUT CONTRAST  TECHNIQUE: Multiplanar, multiecho pulse sequences of the brain and surrounding structures were obtained without intravenous contrast. Angiographic images of the head were obtained using MRA technique without contrast.  COMPARISON:  Head CT without contrast 05/30/2014.  FINDINGS: MRI HEAD FINDINGS  T2 weighted heterogeneous and T1 weighted mostly isointense blood products re - identified in the dorsal  left thalamus, encompassing 19 x 30 x 21 mm (AP by transverse by CC), for an estimated blood volume of 6 mL. Edema and secondary mass effect around the thalamic hemorrhage have not significantly changed. Edema does track into the left midbrain. The diffusion signal abnormality present does primarily correspond to the hemorrhage, but there is restricted diffusion in the posterior left corona radiata which does not definitely correspond to blood products (series 4, image 30).  No other restricted diffusion identified.  Intraventricular extension re- identified and has increased. No ventriculomegaly. There is bilateral subarachnoid hemorrhage which is new or increased (bilateral sylvian fissures series 7, image 15, bilateral occipital sulci image 10, and superior cerebellar folia image 10). No subdural or extradural blood identified. Basilar cisterns remain patent.  Major intracranial vascular flow voids are preserved.  Elsewhere there is scattered mild to moderate for age nonspecific cerebral white matter T2 and FLAIR hyperintensity. No cortical encephalomalacia identified. Right deep gray matter nuclei are within normal limits. Visible internal auditory structures appear normal. Mastoids are clear. Trace paranasal sinus mucosal thickening. Postoperative changes to the globes. Negative pituitary, cervicomedullary junction and visualized cervical spine. Normal bone marrow signal. Visualized scalp soft tissues are within normal limits.  MRA HEAD FINDINGS  Antegrade flow in the posterior circulation with dominant distal left vertebral artery. Patent PICA origins. Patent vertebrobasilar junction. No basilar stenosis. SCA and PCA origins are normal. Posterior communicating arteries are diminutive or absent. Bilateral PCA branches including the left P1 segment are within normal limits.  Antegrade flow in both ICA siphons no siphon stenosis. Normal ophthalmic artery origins. Normal carotid termini. Normal MCA and ACA  origins.  Mildly dominant left A1 segment. Anterior communicating artery and visualized bilateral ACA branches are within normal limits. Visualized bilateral MCA branches are within normal limits.  IMPRESSION: 1. Left thalamic hemorrhage probably represents hemorrhagic transformation of a left thalamic and posterior corona radiata infarct. Intra-axial hemorrhage, edema, and mild mass effect have not significantly changed. 2. Increased intraventricular and bilateral subarachnoid hemorrhage since yesterday. No ventriculomegaly. 3.  Negative intracranial MRA. 4. Underlying mild for age nonspecific signal changes compatible with chronic small vessel disease. Study discussed by telephone with Neurology Dr. Wallie Char on 05/31/2014 at 1626 hours.   Electronically Signed   By: Genevie Ann M.D.   On: 05/31/2014 16:29   Mr Brain Wo Contrast  05/31/2014   CLINICAL DATA:  74 year old male with found down, left thalamic hemorrhage diagnosed on CT. Initial encounter.  EXAM: MRI HEAD WITHOUT CONTRAST  MRA HEAD WITHOUT CONTRAST  TECHNIQUE: Multiplanar, multiecho pulse sequences of the brain and surrounding structures were obtained without intravenous contrast. Angiographic images of the head were obtained using MRA technique without contrast.  COMPARISON:  Head CT without contrast 05/30/2014.  FINDINGS: MRI HEAD FINDINGS  T2 weighted heterogeneous and T1 weighted mostly isointense blood products re - identified in the dorsal left thalamus, encompassing 19 x 30 x 21 mm (AP by transverse by CC), for an estimated blood volume of 6 mL. Edema and secondary mass  effect around the thalamic hemorrhage have not significantly changed. Edema does track into the left midbrain. The diffusion signal abnormality present does primarily correspond to the hemorrhage, but there is restricted diffusion in the posterior left corona radiata which does not definitely correspond to blood products (series 4, image 30).  No other restricted diffusion  identified.  Intraventricular extension re- identified and has increased. No ventriculomegaly. There is bilateral subarachnoid hemorrhage which is new or increased (bilateral sylvian fissures series 7, image 15, bilateral occipital sulci image 10, and superior cerebellar folia image 10). No subdural or extradural blood identified. Basilar cisterns remain patent.  Major intracranial vascular flow voids are preserved.  Elsewhere there is scattered mild to moderate for age nonspecific cerebral white matter T2 and FLAIR hyperintensity. No cortical encephalomalacia identified. Right deep gray matter nuclei are within normal limits. Visible internal auditory structures appear normal. Mastoids are clear. Trace paranasal sinus mucosal thickening. Postoperative changes to the globes. Negative pituitary, cervicomedullary junction and visualized cervical spine. Normal bone marrow signal. Visualized scalp soft tissues are within normal limits.  MRA HEAD FINDINGS  Antegrade flow in the posterior circulation with dominant distal left vertebral artery. Patent PICA origins. Patent vertebrobasilar junction. No basilar stenosis. SCA and PCA origins are normal. Posterior communicating arteries are diminutive or absent. Bilateral PCA branches including the left P1 segment are within normal limits.  Antegrade flow in both ICA siphons no siphon stenosis. Normal ophthalmic artery origins. Normal carotid termini. Normal MCA and ACA origins.  Mildly dominant left A1 segment. Anterior communicating artery and visualized bilateral ACA branches are within normal limits. Visualized bilateral MCA branches are within normal limits.  IMPRESSION: 1. Left thalamic hemorrhage probably represents hemorrhagic transformation of a left thalamic and posterior corona radiata infarct. Intra-axial hemorrhage, edema, and mild mass effect have not significantly changed. 2. Increased intraventricular and bilateral subarachnoid hemorrhage since yesterday. No  ventriculomegaly. 3.  Negative intracranial MRA. 4. Underlying mild for age nonspecific signal changes compatible with chronic small vessel disease. Study discussed by telephone with Neurology Dr. Wallie Char on 05/31/2014 at 1626 hours.   Electronically Signed   By: Genevie Ann M.D.   On: 05/31/2014 16:29   Dg Chest Port 1 View  06/05/2014   CLINICAL DATA:  Short of breath.  Distended abdomen  EXAM: PORTABLE CHEST - 1 VIEW  COMPARISON:  06/04/2014  FINDINGS: Improvement in bibasilar airspace disease likely related to atelectasis. Negative for heart failure or effusion. Heart size normal.  IMPRESSION: Improvement in bibasilar airspace disease most likely related to atelectasis.   Electronically Signed   By: Franchot Gallo M.D.   On: 06/05/2014 09:09   Dg Chest Port 1 View  06/04/2014   CLINICAL DATA:  Acute onset of shortness of breath. Initial encounter.  EXAM: PORTABLE CHEST - 1 VIEW  COMPARISON:  Chest radiograph performed 05/30/2014  FINDINGS: The lungs are hypoexpanded. Patchy left-sided airspace opacity raises concern for mild pneumonia, though mildly asymmetric interstitial edema might have a similar appearance. No pleural effusion or pneumothorax is seen, though the left costophrenic angle is incompletely imaged on this study.  The cardiomediastinal silhouette is within normal limits. No acute osseous abnormalities are seen.  IMPRESSION: Lungs hypoexpanded. Patchy left-sided airspace opacity raises concern for mild pneumonia, though mildly asymmetric interstitial edema might have a similar appearance.   Electronically Signed   By: Garald Balding M.D.   On: 06/04/2014 01:41   Chest Port 1 View  05/30/2014   CLINICAL DATA:  Personal history of  stroke and falling event. Acute intracranial hemorrhage.  EXAM: PORTABLE CHEST - 1 VIEW  COMPARISON:  05/23/2013  FINDINGS: Artifact overlies the chest. Heart size is normal. There is calcification of the thoracic aorta. The lungs are clear. The vascularity is  normal. No effusions. No bony abnormalities.  IMPRESSION: No active disease.   Electronically Signed   By: Nelson Chimes M.D.   On: 05/30/2014 10:12   Dg Abd Portable 1v  06/05/2014   CLINICAL DATA:  74 year old male shortness of breath and abdominal distention. Initial encounter.  EXAM: PORTABLE ABDOMEN - 1 VIEW  COMPARISON:  Abdomen radiographs 04/01/2009.  FINDINGS: Portable AP supine view at 0850 hours. Distended gas-filled bowel loops in the mid abdomen appear to be redundant colon. Small bowel gas appears within normal limits. Mild bladder distension. No gastric distention is evident. No acute osseous abnormality identified. No definite pneumoperitoneum on this supine view.  IMPRESSION: Gas-filled dilated colon without abnormally dilated small bowel. Favor ileus rather than a distal bowel obstruction.   Electronically Signed   By: Genevie Ann M.D.   On: 06/05/2014 09:11   Dg Swallowing Func-speech Pathology  06/10/2014   Lorre Nick, Elsa     06/10/2014  3:20 PM  Objective Swallowing Evaluation: Modified Barium Swallowing Study   Patient Details  Name: Jeffrey [name] MRN: [id number] Date of Birth: [birth year]  Today's Date: 06/10/2014 Time: SLP Start Time (ACUTE ONLY): 1430-SLP Stop Time (ACUTE  ONLY): 1500 SLP Time Calculation (min) (ACUTE ONLY): 30 min  Past Medical History:  Past Medical History  Diagnosis Date  . CAD (coronary artery disease)     PCI distal RCA ...2004, residual 70% LAD   /    ...nuclear...03/2007...no ischemia.Marland KitchenMarland Kitchenpreserved LV /   nuclear...03/03/2009...inferior scar..no ischemia..EF 51%  . Dyslipidemia     takes Atorvastatin daily  . Internal hemorrhoids   . Gout     takes Allopurinol daily and Colchicine as needed  . Seasonal allergic rhinitis   . Right carotid bruit   . Cyst of nasopharynx     per ENT Wilburn Cornelia  . Myocardial infarction 50yrs ago  . Peripheral edema     takes Lasix daily  . HTN (hypertension)     takes Cardura,Metoprolol,[name],and Amlodipine daily  . Arthritis   . GERD  (gastroesophageal reflux disease)     takes Omeprazole daily  . History of colon polyps   . Type 2 diabetes, uncontrolled, with neuropathy 1989    takes Invokana daily and has an insulin pump (Dr. [name])  . 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 Cataract extraction Bilateral 2010  . Elbow surgery Right 1997  . Balloon angioplasty, artery  1992, 2004    CAD, Dr. Ron Parker  . Colonoscopy  04/03/2002    adenomatous polyp, int hemorrhoids  . Colonoscopy  07/18/2007    normal (Dr. Fuller Plan)  . Rotator cuff repair  09/2011    left, with subacromial decompression  . Nasal septum surgery    . Colonoscopy  09/26/2012    tubular adenoma, sm int hem, rpt 5 yrs Fuller Plan)  . Tonsillectomy and adenoidectomy    . Eye lids raised    . Cardiac catheterization  2004  . Polypectomy N/A 05/27/2013    Procedure: ENDOSCOPIC NASOPHARYNGEAL MASS;  Surgeon: [name], MD   HPI:  HPI: 74 year old male transferred 06/05/14 from CIR (due  to  respiratory distress) after acute hospital stay 3/25-30/16 for  left IPH. PMH significant for HTN, CAD, GERD, DM. BSE ordered to  reevaluate swallow function and safety in light of recent CXR  results indicating righ lower lobe opacity.  No Data Recorded  Assessment / Plan / Recommendation CHL IP CLINICAL IMPRESSIONS 06/10/2014  Dysphagia Diagnosis WFL  Clinical impression Normal oropharyngeal swallow function. No  penetration or aspiration observed with any consistency tested.  Trace amount of backflow noted from esophagus to pharynx noted  after puree, however, dry swallows effectively cleared pharynx.  Esophageal sweep following barium tablet revealed no stasis or  backflow. Recommend continuing regular diet with thin liquids. No  further swallowing therapy recommended at this time.      CHL IP TREATMENT RECOMMENDATION 06/10/2014  Treatment Plan Recommendations No treatment recommended at this   time     CHL IP DIET RECOMMENDATION 06/10/2014  Diet Recommendations Regular;Thin liquid  Liquid Administration via Cup;Straw  Medication Administration Whole meds with liquid  Compensations Slow rate;Small sips/bites  Postural Changes and/or Swallow Maneuvers Seated upright 90  degrees;Upright 30-60 min after meal     CHL IP OTHER RECOMMENDATIONS 06/10/2014  Recommended Consults (None)  Oral Care Recommendations Oral care BID  Other Recommendations (None)     CHL IP FOLLOW UP RECOMMENDATIONS 06/10/2014  Follow up Recommendations Inpatient Rehab     CHL IP FREQUENCY AND DURATION 06/10/2014  Speech Therapy Frequency (ACUTE ONLY) (No Data)  Treatment Duration (None)     Pertinent Vitals/Pain No pain reported, VSS    SLP Swallow Goals No flowsheet data found.  No flowsheet data found.    CHL IP REASON FOR REFERRAL 06/10/2014  Reason for Referral Objectively evaluate swallowing function     CHL IP ORAL PHASE 06/10/2014  Lips (None)  Tongue (None)  Mucous membranes (None)  Nutritional status (None)  Other (None)  Oxygen therapy (None)  Oral Phase WFL  Oral - Pudding Teaspoon (None)  Oral - Pudding Cup (None)  Oral - Honey Teaspoon (None)  Oral - Honey Cup (None)  Oral - Honey Syringe (None)  Oral - Nectar Teaspoon (None)  Oral - Nectar Cup (None)  Oral - Nectar Straw (None)  Oral - Nectar Syringe (None)  Oral - Ice Chips (None)  Oral - Thin Teaspoon (None)  Oral - Thin Cup (None)  Oral - Thin Straw (None)  Oral - Thin Syringe (None)  Oral - Puree (None)  Oral - Mechanical Soft (None)  Oral - Regular (None)  Oral - Multi-consistency (None)  Oral - Pill (None)  Oral Phase - Comment (None)      CHL IP PHARYNGEAL PHASE 06/10/2014  Pharyngeal Phase WFL  Pharyngeal - Pudding Teaspoon (None)  Penetration/Aspiration details (pudding teaspoon) (None)  Pharyngeal - Pudding Cup (None)  Penetration/Aspiration details (pudding cup) (None)  Pharyngeal - Honey Teaspoon (None)  Penetration/Aspiration details (honey teaspoon) (None)  Pharyngeal -  Honey Cup (None)  Penetration/Aspiration details (honey cup) (None)  Pharyngeal - Honey Syringe (None)  Penetration/Aspiration details (honey syringe) (None)  Pharyngeal - Nectar Teaspoon (None)  Penetration/Aspiration details (nectar teaspoon) (None)  Pharyngeal - Nectar Cup (None)  Penetration/Aspiration details (nectar cup) (None)  Pharyngeal - Nectar Straw (None)  Penetration/Aspiration details (nectar straw) (None)  Pharyngeal - Nectar Syringe (None)  Penetration/Aspiration details (nectar syringe) (None)  Pharyngeal - Ice Chips (None)  Penetration/Aspiration details (ice chips) (None)  Pharyngeal - Thin Teaspoon (None)  Penetration/Aspiration details (thin teaspoon) (None)  Pharyngeal - Thin Cup (None)  Penetration/Aspiration details (thin cup) (None)  Pharyngeal - Thin Straw (None)  Penetration/Aspiration details (thin straw) (None)  Pharyngeal - Thin Syringe (None)  Penetration/Aspiration details (thin syringe') (None)  Pharyngeal - Puree (None)  Penetration/Aspiration details (puree) (None)  Pharyngeal - Mechanical Soft (None)  Penetration/Aspiration details (mechanical soft) (None)  Pharyngeal - Regular (None)  Penetration/Aspiration details (regular) (None)  Pharyngeal - Multi-consistency (None)  Penetration/Aspiration details (multi-consistency) (None)  Pharyngeal - Pill (None)  Penetration/Aspiration details (pill) (None)  Pharyngeal Comment (None)     CHL IP CERVICAL ESOPHAGEAL PHASE 06/10/2014  Cervical Esophageal Phase Impaired  Pudding Teaspoon (None)  Pudding Cup (None)  Honey Teaspoon (None)  Honey Cup (None)  Honey Syringe (None)  Nectar Teaspoon (None)  Nectar Cup (None)  Nectar Straw (None)  Nectar Syringe (None)  Thin Teaspoon (None)  Thin Cup (None)  Thin Straw (None)  Thin Syringe (None)  Cervical Esophageal Comment (None)    No flowsheet data found.        Celia B. Quentin Ore [facility], CCC-SLP [phone]  Shonna Chock 06/10/2014, 3:19 PM     Microbiology: Recent Results (from the past  240 hour(s))  Clostridium Difficile by PCR     Status: None   Collection Time: 06/02/14 11:00 AM  Result Value Ref Range Status   C difficile by pcr NEGATIVE NEGATIVE Final  Urine culture     Status: None   Collection Time: 06/05/14  9:15 AM  Result Value Ref Range Status   Specimen Description URINE, RANDOM  Final   Special Requests NONE  Final   Colony Count NO GROWTH Performed at [facility]   Final   Culture NO GROWTH Performed at [facility]   Final   Report Status 06/06/2014 FINAL  Final  Culture, blood (routine x 2)     Status: None   Collection Time: 06/05/14 10:00 AM  Result Value Ref Range Status   Specimen Description BLOOD RIGHT ARM  Final   Special Requests BOTTLES DRAWN AEROBIC AND ANAEROBIC  10CC  Final   Culture   Final    STAPHYLOCOCCUS SPECIES (COAGULASE NEGATIVE) Note: THE SIGNIFICANCE OF ISOLATING THIS ORGANISM FROM A SINGLE SET OF BLOOD CULTURES WHEN MULTIPLE SETS ARE DRAWN IS UNCERTAIN. PLEASE NOTIFY THE MICROBIOLOGY DEPARTMENT WITHIN ONE WEEK IF SPECIATION AND SENSITIVITIES ARE REQUIRED. Note: Gram Stain Report Called to,Read Back By and Verified With: DEE M RN @620PM  Thelma Comp [phone] Performed at [facility]    Report Status 06/07/2014 FINAL  Final  Culture, blood (routine x 2)     Status: None   Collection Time: 06/05/14 10:07 AM  Result Value Ref Range Status   Specimen Description BLOOD BLOOD RIGHT FOREARM  Final   Special Requests BOTTLES DRAWN AEROBIC AND ANAEROBIC 10CC  Final   Culture   Final    NO GROWTH 5 DAYS Performed at [facility]    Report Status 06/11/2014 FINAL  Final  Clostridium Difficile by PCR     Status: None   Collection Time: 06/08/14  3:57 PM  Result Value Ref Range Status   C difficile by pcr NEGATIVE NEGATIVE Final     Labs: Basic Metabolic Panel:  Recent Labs Lab 06/06/14 0402 06/07/14 0417 06/08/14 1125 06/09/14 0339 06/10/14 0733 06/11/14 0531  NA 144 139 137 139 139 137  K  3.6 3.5 3.3* 3.2* 3.2* 3.5  CL 115* 111 107 108 106 103  CO2 22 22 20 24 27 26   GLUCOSE 134* 127* 165*  141* 133* 155*  BUN 28* 22 20 15 12 11   CREATININE 1.11 1.03 0.82 0.84 0.81 0.80  CALCIUM 9.1 8.6 8.2* 8.3* 8.3* 8.5  MG 2.2  --   --   --  2.0  --   PHOS 2.5  --   --   --   --   --    Liver Function Tests:  Recent Labs Lab 06/10/14 0733  AST 17  ALT 24  ALKPHOS 93  BILITOT 1.2  PROT 5.3*  ALBUMIN 2.3*   No results for input(s): LIPASE, AMYLASE in the last 168 hours. No results for input(s): AMMONIA in the last 168 hours. CBC:  Recent Labs Lab 06/05/14 0818 06/06/14 0402 06/07/14 0417 06/09/14 0339  WBC 26.1* 12.6* 9.4 9.7  HGB 16.7 13.7 12.8* 12.7*  HCT 49.4 41.0 38.6* 37.6*  MCV 92.0 90.9 90.6 88.7  PLT 137* 98* 101* 113*   Cardiac Enzymes:  Recent Labs Lab 06/05/14 0908 06/05/14 1316 06/05/14 1710 06/06/14 0019  TROPONINI 0.23* 1.25* 1.56* 0.96*   BNP: BNP (last 3 results)  Recent Labs  06/05/14 0908  BNP 86.1    ProBNP (last 3 results) No results for input(s): PROBNP in the last 8760 hours.  CBG:  Recent Labs Lab 06/10/14 0628 06/10/14 1157 06/10/14 1734 06/10/14 2106 06/11/14 0626  GLUCAP 143* 185* 146* 205* 146*       Signed:  Amamda Curbow  [facility] 06/11/2014, 9:29 AM

## 2014-06-11 NOTE — Progress Notes (Signed)
PT Cancellation Note  Patient Details Name: Jeffrey [name] MRN: [id number] DOB: 11/05/1940   Cancelled Treatment:    Reason Eval/Treat Not Completed: Per chart review, pt to d/c to CIR today. Will defer further PT treatment to inpatient rehab team. If d/c is delayed, will continue to follow acutely.    Rolinda Roan 06/11/2014, 9:38 AM   Rolinda Roan, PT, DPT Acute Rehabilitation Services Pager: [phone]

## 2014-06-11 NOTE — Progress Notes (Signed)
  Pharmacy Discharge Medication Therapy Review   Total Number of meds on admission: 5 (polypharmacy > 10 meds)  Indications for all medications: [x]  Yes       []  No  Adherence Review  []  Excellent (no doses missed/week)     [x]  Good (no more than 1 dose missed/week)     []  Partial (2-3 doses missed/week)     []  Poor (>3 doses missed/week)  Total number of high risk medications: 3 (Anticoagulants, Dual antiplatelets, oral Antihyperglycemic agents, Insulins, Antipsychotics, Anti-Seizure meds, Inhalers, HF/ACS meds, Antibiotics and HIV medications)   Assessment: (Medication related problems)  Intervention  YES NO  Explanation   Indications      Medication without noted indication []  [x]     Indication without noted medication [x]  []  Patient with CAD and no statin therapy here. Previous outpatient notes state patient takes atorvastatin 40mg  daily - confirmed this with patient as well.   Duplicate therapy []  [x]    Efficacy      Suboptimal drug or dose selection []  [x]    Insufficient dose/duration []  [x]    Failure to receive therapy  (Rx not filled) []  [x]     Safety      Adverse drug event []  [x]     Drug interaction []  [x]     Excessive dose/duration []  [x]    High-risk medications [x]  []  Insulin pump, Invokana, Levaquin  Compliance     Underuse []  [x]     Overuse []  [x]    Other pertinent pharmacist counseling [x]  []  New Levaquin and Flomax - patient counseled on both medications. Patient to stop taking Invokana at discharge per [facility].    Total number of new medications upon discharge: 2  Time:  Time spent preparing for discharge counseling: 15 minutes Time spent counseling patient: 10 minutes  PLAN:  Recommendations discussed with and accepted by Dr. Broadus John are as follows: - Resume atorvastatin 40mg  daily - Patient to continue meal time Novolog while at CIR, will resume insulin pump at discharge  [place]. Supple, Pharm.D Clinical Pharmacy Resident Pager: [phone] 06/11/2014  10:03 AM

## 2014-06-11 NOTE — Progress Notes (Signed)
Inpt rehab bed is available today. I contacted Dr. Broadus John and she is in agreement to admission to rehab today. I will make the arrangements. Pt is in agreement. [phone]

## 2014-06-12 ENCOUNTER — Inpatient Hospital Stay (HOSPITAL_COMMUNITY): Payer: Medicare Other | Admitting: Speech Pathology

## 2014-06-12 ENCOUNTER — Inpatient Hospital Stay (HOSPITAL_COMMUNITY): Payer: Medicare Other | Admitting: Physical Therapy

## 2014-06-12 ENCOUNTER — Inpatient Hospital Stay (HOSPITAL_COMMUNITY): Payer: Medicare Other | Admitting: Occupational Therapy

## 2014-06-12 LAB — GLUCOSE, CAPILLARY
GLUCOSE-CAPILLARY: 147 mg/dL — AB (ref 70–99)
GLUCOSE-CAPILLARY: 156 mg/dL — AB (ref 70–99)
GLUCOSE-CAPILLARY: 190 mg/dL — AB (ref 70–99)
Glucose-Capillary: 155 mg/dL — ABNORMAL HIGH (ref 70–99)

## 2014-06-12 LAB — CBC WITH DIFFERENTIAL/PLATELET
Basophils Absolute: 0 10*3/uL (ref 0.0–0.1)
Basophils Relative: 1 % (ref 0–1)
Eosinophils Absolute: 0.1 10*3/uL (ref 0.0–0.7)
Eosinophils Relative: 1 % (ref 0–5)
HCT: 40.8 % (ref 39.0–52.0)
Hemoglobin: 14.1 g/dL (ref 13.0–17.0)
LYMPHS ABS: 0.7 10*3/uL (ref 0.7–4.0)
LYMPHS PCT: 8 % — AB (ref 12–46)
MCH: 30.6 pg (ref 26.0–34.0)
MCHC: 34.6 g/dL (ref 30.0–36.0)
MCV: 88.5 fL (ref 78.0–100.0)
Monocytes Absolute: 0.7 10*3/uL (ref 0.1–1.0)
Monocytes Relative: 8 % (ref 3–12)
Neutro Abs: 6.8 10*3/uL (ref 1.7–7.7)
Neutrophils Relative %: 82 % — ABNORMAL HIGH (ref 43–77)
PLATELETS: 199 10*3/uL (ref 150–400)
RBC: 4.61 MIL/uL (ref 4.22–5.81)
RDW: 13.3 % (ref 11.5–15.5)
WBC: 8.3 10*3/uL (ref 4.0–10.5)

## 2014-06-12 LAB — COMPREHENSIVE METABOLIC PANEL
ALBUMIN: 2.5 g/dL — AB (ref 3.5–5.2)
ALT: 29 U/L (ref 0–53)
AST: 25 U/L (ref 0–37)
Alkaline Phosphatase: 105 U/L (ref 39–117)
Anion gap: 10 (ref 5–15)
BILIRUBIN TOTAL: 1.2 mg/dL (ref 0.3–1.2)
BUN: 11 mg/dL (ref 6–23)
CO2: 23 mmol/L (ref 19–32)
CREATININE: 0.81 mg/dL (ref 0.50–1.35)
Calcium: 8.4 mg/dL (ref 8.4–10.5)
Chloride: 103 mmol/L (ref 96–112)
GFR, EST NON AFRICAN AMERICAN: 87 mL/min — AB (ref >90)
Glucose, Bld: 166 mg/dL — ABNORMAL HIGH (ref 70–99)
Potassium: 3.7 mmol/L (ref 3.5–5.1)
Sodium: 136 mmol/L (ref 135–145)
Total Protein: 5.4 g/dL — ABNORMAL LOW (ref 6.0–8.3)

## 2014-06-12 NOTE — Care Management Note (Signed)
[place] Individual Statement of Services  Patient Name:  Jeffrey [name]  Date:  06/12/2014  Welcome to the [facility].  Our goal is to provide you with an individualized program based on your diagnosis and situation, designed to meet your specific needs.  With this comprehensive rehabilitation program, you will be expected to participate in at least 3 hours of rehabilitation therapies Monday-Friday, with modified therapy programming on the weekends.  Your rehabilitation program will include the following services:  Physical Therapy (PT), Occupational Therapy (OT), Speech Therapy (ST), 24 hour per day rehabilitation nursing, Therapeutic Recreaction (TR), Neuropsychology, Case Management (Social Worker), Rehabilitation Medicine, Nutrition Services and Pharmacy Services  Weekly team conferences will be held on Wednesday to discuss your progress.  Your Social Worker will talk with you frequently to get your input and to update you on team discussions.  Team conferences with you and your family in attendance may also be held.  Expected length of stay: 21-23 days  Overall anticipated outcome: min assist level  Depending on your progress and recovery, your program may change. Your Social Worker will coordinate services and will keep you informed of any changes. Your Social Worker's name and contact numbers are listed  below.  The following services may also be recommended but are not provided by the [facility] will be made to provide these services after discharge if needed.  Arrangements include referral to agencies that provide these services.  Your insurance has been verified to be:  [place] primary doctor is:  [name]  Pertinent information will be shared with your doctor and your insurance  company.  Social Worker:  Ovidio Kin, [place] or ([phone]  Information discussed with and copy given to patient by: Elease Hashimoto, 06/12/2014, 2:02 PM

## 2014-06-12 NOTE — Evaluation (Signed)
Occupational Therapy Assessment and Plan  Patient Details  Name: Jeffrey [name] MRN: [id number] Date of Birth: 1940/08/10  OT Diagnosis: abnormal posture, apraxia, disturbance of vision, hemiplegia affecting dominant side and muscle weakness (generalized) Rehab Potential: Rehab Potential (ACUTE ONLY): Good ELOS: 21-23 days   Today's Date: 06/12/2014 OT Individual Time: 1000-1100 OT Individual Time Calculation (min): 60 min     Problem List:  Patient Active Problem List   Diagnosis Date Noted  . ICH (intracerebral hemorrhage) 06/11/2014  . Acute respiratory failure with hypoxia 06/05/2014  . HCAP (healthcare-associated pneumonia) 06/05/2014  . Adynamic ileus 06/05/2014  . Sepsis 06/05/2014  . Thrombocytopenia 06/05/2014  . Acute renal failure 06/05/2014  . Chronic diastolic heart failure, NYHA class 1 06/05/2014  . Ileus of unspecified type 06/05/2014  . Intracerebral hemorrhage 06/04/2014  . Tobacco abuse 06/03/2014  . Obesity 06/03/2014  . Nontraumatic intracranial hemorrhage 05/30/2014  . Benign paroxysmal positional vertigo 04/28/2014  . 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 Right carotid bruit   . Polyneuropathy, diabetic 08/05/2012  . Seasonal allergic rhinitis   . Trigger thumb of left hand 10/12/2011  . Acute chest wall pain 06/01/2011  . Epigastric abdominal pain 11/11/2010  . Gout   . Adenomatous polyps   . CAD (coronary artery disease)   . Essential hypertension   . Hyperlipidemia   . Type 2 diabetes, uncontrolled, with neuropathy   . Ejection fraction     Past Medical History:  Past Medical History  Diagnosis Date  . CAD (coronary artery disease)     PCI distal RCA ...2004, residual 70% LAD   /   ...nuclear...03/2007...no ischemia.Marland KitchenMarland Kitchenpreserved LV /  nuclear...03/03/2009...inferior scar..no ischemia..EF 51%  . Dyslipidemia     takes Atorvastatin daily  . Internal hemorrhoids   . Gout     takes Allopurinol daily and Colchicine as needed  . Seasonal allergic  rhinitis   . Right carotid bruit   . Cyst of nasopharynx     per ENT Wilburn Cornelia  . Myocardial infarction 66yr ago  . Peripheral edema     takes Lasix daily  . HTN (hypertension)     takes Cardura,Metoprolol,[name],and Amlodipine daily  . Arthritis   . GERD (gastroesophageal reflux disease)     takes Omeprazole daily  . History of colon polyps   . Type 2 diabetes, uncontrolled, with neuropathy 1989    takes Invokana daily and has an insulin pump ([name]  . 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 Cataract extraction Bilateral 2010  . Elbow surgery Right 1997  . Balloon angioplasty, artery  1992, 2004    CAD, Dr. KRon Parker . Colonoscopy  04/03/2002    adenomatous polyp, int hemorrhoids  . Colonoscopy  07/18/2007    normal (Dr. SFuller Plan  . Rotator cuff repair  09/2011    left, with subacromial decompression  . Nasal septum surgery    . Colonoscopy  09/26/2012    tubular adenoma, sm int hem, rpt 5 yrs (Fuller Plan  . Tonsillectomy and adenoidectomy    . Eye lids raised    . Cardiac catheterization  2004  . Polypectomy N/A 05/27/2013    Procedure: ENDOSCOPIC NASOPHARYNGEAL MASS;  Surgeon: DJerrell Belfast MD    Assessment & Plan Clinical Impression: Patient is a 74y.o. year old male with recent admission to the hospital on 05/30/2014 with right sided weakness and slurred speech.  Cranial CT scan showed acute left thalamic and left internal capsule parenchymal hemorrhage compatible with hypertensive hemorrhage. MRI again notes left thalamic hemorrhage showing some increased intraventricular and bilateral subarachnoid hemorrhage since recent cranial CT. MRA of the head was negative. Carotid Dopplers with no ICA stenosis. Echocardiogram with ejection fraction of 26% grade 1 diastolic dysfunction. Neurology follow-up with conservative care. Tolerating a regular consistency  diet. Bouts of loose stool with C. difficile specimen 06/02/2014 negative. Physical therapy evaluation completed and ongoing with recommendations of physical medicine rehabilitation consult. Patient was admitted for a comprehensive rehabilitation program 06/03/2014 with therapies ongoing he was requiring total assist due to decreased standing balance. On 06/05/2014 patient with respiratory distress and oxygen saturations in the 80s and placed on NRB with pulmonary service consulted. Chest x-ray concerning for early pneumonia and placed on vancomycin as well as Zosyn. He was discharged to acute care services for ongoing workup with   Patient transferred to CIR on 06/11/2014 .    Patient currently requires max with basic self-care skills secondary to muscle weakness, impaired timing and sequencing, unbalanced muscle activation, motor apraxia, decreased coordination and decreased motor planning, decreased visual motor skills and decreased standing balance, decreased postural control, hemiplegia and decreased balance strategies.  Prior to hospitalization, patient could complete ADLs with independent .  Patient will benefit from skilled intervention to decrease level of assist with basic self-care skills and increase independence with basic self-care skills prior to discharge home with care partner.  Anticipate patient will require minimal physical assistance and follow up home health.  OT - End of Session Activity Tolerance: Tolerates 30+ min activity with multiple rests Endurance Deficit: Yes OT Assessment Rehab Potential (ACUTE ONLY): Good Barriers to Discharge: Decreased caregiver support Barriers to Discharge Comments: wife is currently in SNF post rotator cuff repair OT Patient demonstrates impairments in the following area(s): Balance OT Basic ADL's Functional Problem(s): Grooming;Bathing;Dressing;Toileting;Eating OT Advanced ADL's Functional Problem(s): Simple Meal Preparation OT Transfers  Functional Problem(s): Toilet OT Additional Impairment(s): Fuctional Use of Upper Extremity OT Plan OT Intensity: Minimum of 1-2 x/day, 45 to 90 minutes OT Duration/Estimated Length of Stay: 21-23 days OT Treatment/Interventions: Balance/vestibular training;Cognitive remediation/compensation;Discharge planning;Disease mangement/prevention;DME/adaptive equipment instruction;Functional mobility training;Neuromuscular re-education;Pain management;Patient/family education;Psychosocial support;Self Care/advanced ADL retraining;Splinting/orthotics;Therapeutic Activities;Therapeutic Exercise;UE/LE Strength taining/ROM;UE/LE Coordination activities;Visual/perceptual remediation/compensation;Community reintegration;Functional electrical stimulation OT Self Feeding Anticipated Outcome(s): modified independent OT Basic Self-Care Anticipated Outcome(s): min assist level OT Toileting Anticipated Outcome(s): min assist level OT Bathroom Transfers Anticipated Outcome(s): min assist level OT Recommendation Patient destination: Home Follow Up Recommendations: Home health OT Equipment Recommended: 3 in 1 bedside comode;Tub/shower bench   Skilled Therapeutic Intervention Pt began working on selfcare re-training sit to stand at the EOB.  Supervision for sitting balance with min instructional cueing to sequence bathing.  He demonstrated decreased ability to integrate the RUE into function and just attempted to use the LUE for squeezing out the washcloth.  Max assist for pt to use the RUE to hold the soap in order to remove the cap.  Increased motor apraxia noted with attempted use as well.  Pt with impaired sensation throughout the arm so needs verbal cueing to reposition in his lap.  Max assist for standing with total assist to perform stand pivot from bed to wheelchair.  Pt unable to advance the RLE to take steps during transfer.  Provided binasal occlusion to help decrease diplopia in bilateral visual fields.   OT  Evaluation Precautions/Restrictions  Precautions Precautions: Fall Precaution Comments: due to right hemiplegia, diplopia Restrictions  Weight Bearing Restrictions: No  Pain Pain Assessment Pain Assessment: No/denies pain Home Living/Prior Functioning Home Living Available Help at Discharge: Family, Friend(s) Type of Home: House Home Access: Stairs to enter [employer] of Steps: 4 Entrance Stairs-Rails: Right, Left, Can reach both Home Layout: One level Additional Comments: wife with recent rotator cuff repair curently at [facility]. Will need to clarify home setup.  Lives With: Spouse IADL History Current License: Yes Occupation: Retired Leisure and Hobbies: Likes to fish but has not had the opportunity to do this in a while. Prior Function Level of Independence: Independent with basic ADLs, Independent with transfers, Independent with gait, Independent with homemaking with ambulation  Able to Take Stairs?: Yes Driving: Yes Vocation: Retired Leisure: Hobbies-yes (Comment) (Likes to hang out with his friends. ) Comments: Retired Theme park manager ADL  See FIM scale for details  Vision/Perception  Vision- History Baseline Vision/History: Wears glasses Wears Glasses: At all times Patient Visual Report: Diplopia Vision- Assessment Vision Assessment?: Yes Eye Alignment: Impaired (comment) (right eye sits superiorly compared to the left) Ocular Range of Motion: Other (comment) (restricted will continue to assess in function) Alignment/Gaze Preference: Head tilt (Head tilt to the right) Tracking/Visual Pursuits: Decreased smoothness of horizontal tracking;Decreased smoothness of vertical tracking Visual Fields: No apparent deficits Diplopia Assessment: Disappears with one eye closed;Objects split side to side;Only with left gaze;Only with right gaze;Present in primary gaze;Other (comment) (Pt with no diplopia in the inferior visual field both on the left and right side.)   Cognition Overall Cognitive Status: Impaired/Different from baseline Arousal/Alertness: Awake/alert Orientation Level: Oriented X4 Attention: Alternating Sustained Attention: Appears intact Alternating Attention: Impaired Alternating Attention Impairment: Functional basic Memory: Impaired Memory Impairment: Retrieval deficit;Decreased recall of new information;Other (comment) Awareness: Appears intact Awareness Impairment: Emergent impairment;Anticipatory impairment Problem Solving: Impaired Problem Solving Impairment: Functional basic Executive Function: Self Monitoring;Self Correcting;Organizing Organizing: Impaired Organizing Impairment: Functional basic Self Monitoring: Impaired Self Monitoring Impairment: Functional basic Self Correcting: Impaired Self Correcting Impairment: Functional basic Safety/Judgment: Appears intact Comments: Pt able to state antiipatory awareness of needing assistance and not trying to get up out of the chair by himself.  Sensation Sensation Light Touch: Impaired Detail Light Touch Impaired Details: Absent RUE Stereognosis: Impaired Detail Stereognosis Impaired Details: Absent RUE Hot/Cold: Not tested Proprioception: Impaired Detail Proprioception Impaired Details: Absent RUE Coordination Gross Motor Movements are Fluid and Coordinated: No Fine Motor Movements are Fluid and Coordinated: No Coordination and Movement Description: Pt with RUE motor apraxia during functional use with selfcare tasks.  He is able to integrate into bathing as a gross assist but needs visual attention secondary to imparied sensation.  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 Motor  Motor Motor: Hemiplegia;Abnormal postural alignment and control;Motor apraxia Mobility  Bed Mobility Bed Mobility: Supine to Sit Supine to Sit: 2: Max assist;HOB flat Supine to Sit Details: Verbal cues for technique;Manual facilitation for weight shifting;Manual facilitation  for weight bearing Transfers Transfers: Sit to Stand;Stand to Sit Sit to Stand: With upper extremity assist;2: Max assist;From bed Sit to Stand Details: Verbal cues for technique;Manual facilitation for weight shifting;Verbal cues for precautions/safety Stand to Sit: 3: Mod assist;With upper extremity assist;To bed Stand to Sit Details (indicate cue type and reason): Verbal cues for sequencing;Verbal cues for technique;Manual facilitation for weight shifting  Trunk/Postural Assessment  Cervical Assessment Cervical Assessment: Exceptions to [facility] (maintains cervical flexion and slight left lateral flexion/tilt at rest) Thoracic Assessment Thoracic Assessment: Within Functional Limits Lumbar Assessment Lumbar Assessment: Within Functional Limits  Postural Control Postural Control: Deficits on evaluation (maintains posterior pelvic tilt in sitting with left trunk shortening and right trunk elongation)  Balance Balance Balance Assessed: Yes Static Sitting Balance Static Sitting - Balance Support: Right upper extremity supported;Left upper extremity supported Static Sitting - Level of Assistance: 5: Stand by assistance Dynamic Sitting Balance Dynamic Sitting - Balance Support: No upper extremity supported Dynamic Sitting - Level of Assistance: 4: 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  He demonstrates synergy pattern with functional  use but can exhibit isolated elbow AROM during selfcare tasks.  Gross grasp and release noted with pt begin able to oppose thumb to 2nd and 3rd digits.  LUE Assessment LUE Assessment: Within Functional Limits  FIM:  FIM - Eating Eating Activity: 5: Set-up assist for cut food;5: Set-up assist for open containers FIM - Grooming Grooming Steps: Wash, rinse, dry face;Wash, rinse, dry hands;Brush, comb hair Grooming: 3: Patient completes 2 of 4 or 3 of 5 steps FIM - Bathing Bathing Steps Patient Completed: Chest;Abdomen;Right upper leg;Left upper leg;Right Arm Bathing: 3: Mod-Patient completes 5-7 [id number] parts or 50-74% FIM - Upper Body Dressing/Undressing Upper body dressing/undressing steps patient completed: Thread/unthread left sleeve of pullover shirt/dress;Put head through opening of pull over shirt/dress Upper body dressing/undressing: 3: Mod-Patient completed 50-74% of tasks FIM - Lower Body Dressing/Undressing Lower body dressing/undressing: 1: Total-Patient completed less than 25% of tasks FIM - Bed/Chair Transfer Bed/Chair Transfer: 2: Sit > Supine: Max A (lifting assist/Pt. 25-49%);2: Chair or W/C > Bed: Max A (lift and lower assist)   Refer to Care Plan for Long Term Goals  Recommendations for other services: None  Discharge Criteria: Patient will be discharged from OT if patient refuses treatment 3 consecutive times without medical reason, if treatment goals not met, if there is a change in medical status, if patient makes no progress towards goals or if patient is discharged from hospital.  The above assessment, treatment plan, treatment alternatives and goals were discussed and mutually agreed upon: by patient  Denton Derks OTR/L 06/12/2014, 1:17 PM

## 2014-06-12 NOTE — Progress Notes (Signed)
74 y.o. right handed male with history of remote tobacco abuse, hypertension, diabetes mellitus and peripheral neuropathy maintained on Invokana and has an insulin pump(Dr. Buddy Duty), coronary artery disease with balloon angioplasty. Independent prior to admission living with his wife. Presented 05/30/2014 with right sided weakness and slurred speech. Cranial CT scan showed acute left thalamic and left internal capsule parenchymal hemorrhage compatible with hypertensive hemorrhage. MRI again notes left thalamic hemorrhage showing some increased intraventricular and bilateral subarachnoid hemorrhage since recent cranial CT. MRA of the head was negative. Carotid Dopplers with no ICA stenosis  Subjective/Complaints: Readmitted after transfer to acute care for HCAP Passed MBS 4/5  Objective: Vital Signs: Blood pressure 155/72, pulse 85, temperature 98.5 F (36.9 C), temperature source Oral, resp. rate 18, height $RemoveBe'5\' 8"'NTXyKjnlq$  (1.727 m), weight 83.462 kg (184 lb), SpO2 90 %. Dg Swallowing Func-speech Pathology  06/10/2014   Lorre Nick, [place]     06/10/2014  3:20 PM  Objective Swallowing Evaluation: Modified Barium Swallowing Study   Patient Details  Name: GRANVIL DJORDJEVIC MRN: [id number] Date of Birth: May 29, 1940  Today's Date: 06/10/2014 Time: SLP Start Time (ACUTE ONLY): 1430-SLP Stop Time (ACUTE  ONLY): 1500 SLP Time Calculation (min) (ACUTE ONLY): 30 min  Past Medical History:  Past Medical History  Diagnosis Date  . CAD (coronary artery disease)     PCI distal RCA ...2004, residual 70% LAD   /    ...nuclear...03/2007...no ischemia.Marland KitchenMarland Kitchenpreserved LV /   nuclear...03/03/2009...inferior scar..no ischemia..EF 51%  . Dyslipidemia     takes Atorvastatin daily  . Internal hemorrhoids   . Gout     takes Allopurinol daily and Colchicine as needed  . Seasonal allergic rhinitis   . Right carotid bruit   . Cyst of nasopharynx     per ENT Wilburn Cornelia  . Myocardial infarction 45yrs ago  . Peripheral edema     takes Lasix daily  . HTN  (hypertension)     takes Cardura,Metoprolol,[name],and Amlodipine daily  . Arthritis   . GERD (gastroesophageal reflux disease)     takes Omeprazole daily  . History of colon polyps   . Type 2 diabetes, uncontrolled, with neuropathy 1989    takes Invokana daily and has an insulin pump (Dr. [name])  . 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 Cataract extraction Bilateral 2010  . Elbow surgery Right 1997  . Balloon angioplasty, artery  1992, 2004    CAD, Dr. Ron Parker  . Colonoscopy  04/03/2002    adenomatous polyp, int hemorrhoids  . Colonoscopy  07/18/2007    normal (Dr. Fuller Plan)  . Rotator cuff repair  09/2011    left, with subacromial decompression  . Nasal septum surgery    . Colonoscopy  09/26/2012    tubular adenoma, sm int hem, rpt 5 yrs Fuller Plan)  . Tonsillectomy and adenoidectomy    . Eye lids raised    . Cardiac catheterization  2004  . Polypectomy N/A 05/27/2013    Procedure: ENDOSCOPIC NASOPHARYNGEAL MASS;  Surgeon: [name], MD   HPI:  HPI: 74 year old male transferred 06/05/14 from CIR (due to  respiratory distress) after acute hospital stay 3/25-30/16 for  left IPH. PMH significant for HTN, CAD, GERD, DM. BSE ordered to  reevaluate swallow function and safety in light of recent CXR  results indicating righ lower lobe opacity.  No Data Recorded  Assessment / Plan /  Recommendation CHL IP CLINICAL IMPRESSIONS 06/10/2014  Dysphagia Diagnosis WFL  Clinical impression Normal oropharyngeal swallow function. No  penetration or aspiration observed with any consistency tested.  Trace amount of backflow noted from esophagus to pharynx noted  after puree, however, dry swallows effectively cleared pharynx.  Esophageal sweep following barium tablet revealed no stasis or  backflow. Recommend continuing regular diet with thin liquids. No  further swallowing therapy recommended at this time.      CHL IP  TREATMENT RECOMMENDATION 06/10/2014  Treatment Plan Recommendations No treatment recommended at this  time     CHL IP DIET RECOMMENDATION 06/10/2014  Diet Recommendations Regular;Thin liquid  Liquid Administration via Cup;Straw  Medication Administration Whole meds with liquid  Compensations Slow rate;Small sips/bites  Postural Changes and/or Swallow Maneuvers Seated upright 90  degrees;Upright 30-60 min after meal     CHL IP OTHER RECOMMENDATIONS 06/10/2014  Recommended Consults (None)  Oral Care Recommendations Oral care BID  Other Recommendations (None)     CHL IP FOLLOW UP RECOMMENDATIONS 06/10/2014  Follow up Recommendations Inpatient Rehab     CHL IP FREQUENCY AND DURATION 06/10/2014  Speech Therapy Frequency (ACUTE ONLY) (No Data)  Treatment Duration (None)     Pertinent Vitals/Pain No pain reported, VSS    SLP Swallow Goals No flowsheet data found.  No flowsheet data found.    CHL IP REASON FOR REFERRAL 06/10/2014  Reason for Referral Objectively evaluate swallowing function     CHL IP ORAL PHASE 06/10/2014  Lips (None)  Tongue (None)  Mucous membranes (None)  Nutritional status (None)  Other (None)  Oxygen therapy (None)  Oral Phase WFL  Oral - Pudding Teaspoon (None)  Oral - Pudding Cup (None)  Oral - Honey Teaspoon (None)  Oral - Honey Cup (None)  Oral - Honey Syringe (None)  Oral - Nectar Teaspoon (None)  Oral - Nectar Cup (None)  Oral - Nectar Straw (None)  Oral - Nectar Syringe (None)  Oral - Ice Chips (None)  Oral - Thin Teaspoon (None)  Oral - Thin Cup (None)  Oral - Thin Straw (None)  Oral - Thin Syringe (None)  Oral - Puree (None)  Oral - Mechanical Soft (None)  Oral - Regular (None)  Oral - Multi-consistency (None)  Oral - Pill (None)  Oral Phase - Comment (None)      CHL IP PHARYNGEAL PHASE 06/10/2014  Pharyngeal Phase WFL  Pharyngeal - Pudding Teaspoon (None)  Penetration/Aspiration details (pudding teaspoon) (None)  Pharyngeal - Pudding Cup (None)  Penetration/Aspiration details (pudding cup) (None)   Pharyngeal - Honey Teaspoon (None)  Penetration/Aspiration details (honey teaspoon) (None)  Pharyngeal - Honey Cup (None)  Penetration/Aspiration details (honey cup) (None)  Pharyngeal - Honey Syringe (None)  Penetration/Aspiration details (honey syringe) (None)  Pharyngeal - Nectar Teaspoon (None)  Penetration/Aspiration details (nectar teaspoon) (None)  Pharyngeal - Nectar Cup (None)  Penetration/Aspiration details (nectar cup) (None)  Pharyngeal - Nectar Straw (None)  Penetration/Aspiration details (nectar straw) (None)  Pharyngeal - Nectar Syringe (None)  Penetration/Aspiration details (nectar syringe) (None)  Pharyngeal - Ice Chips (None)  Penetration/Aspiration details (ice chips) (None)  Pharyngeal - Thin Teaspoon (None)  Penetration/Aspiration details (thin teaspoon) (None)  Pharyngeal - Thin Cup (None)  Penetration/Aspiration details (thin cup) (None)  Pharyngeal - Thin Straw (None)  Penetration/Aspiration details (thin straw) (None)  Pharyngeal - Thin Syringe (None)  Penetration/Aspiration details (thin syringe') (None)  Pharyngeal - Puree (None)  Penetration/Aspiration details (puree) (None)  Pharyngeal - Mechanical Soft (None)  Penetration/Aspiration details (mechanical  soft) (None)  Pharyngeal - Regular (None)  Penetration/Aspiration details (regular) (None)  Pharyngeal - Multi-consistency (None)  Penetration/Aspiration details (multi-consistency) (None)  Pharyngeal - Pill (None)  Penetration/Aspiration details (pill) (None)  Pharyngeal Comment (None)     CHL IP CERVICAL ESOPHAGEAL PHASE 06/10/2014  Cervical Esophageal Phase Impaired  Pudding Teaspoon (None)  Pudding Cup (None)  Honey Teaspoon (None)  Honey Cup (None)  Honey Syringe (None)  Nectar Teaspoon (None)  Nectar Cup (None)  Nectar Straw (None)  Nectar Syringe (None)  Thin Teaspoon (None)  Thin Cup (None)  Thin Straw (None)  Thin Syringe (None)  Cervical Esophageal Comment (None)    No flowsheet data found.        Celia B. Quentin [facility], CCC-SLP  [phone]  Shonna Chock 06/10/2014, 3:19 PM    Results for orders placed or performed during the hospital encounter of 06/11/14 (from the past 72 hour(s))  Glucose, capillary     Status: Abnormal   Collection Time: 06/11/14  9:30 PM  Result Value Ref Range   Glucose-Capillary 173 (H) 70 - 99 mg/dL  CBC WITH DIFFERENTIAL     Status: Abnormal   Collection Time: 06/12/14  6:31 AM  Result Value Ref Range   WBC 8.3 4.0 - 10.5 K/uL   RBC 4.61 4.22 - 5.81 MIL/uL   Hemoglobin 14.1 13.0 - 17.0 g/dL   HCT 40.8 39.0 - 52.0 %   MCV 88.5 78.0 - 100.0 fL   MCH 30.6 26.0 - 34.0 pg   MCHC 34.6 30.0 - 36.0 g/dL   RDW 13.3 11.5 - 15.5 %   Platelets 199 150 - 400 K/uL   Neutrophils Relative % 82 (H) 43 - 77 %   Neutro Abs 6.8 1.7 - 7.7 K/uL   Lymphocytes Relative 8 (L) 12 - 46 %   Lymphs Abs 0.7 0.7 - 4.0 K/uL   Monocytes Relative 8 3 - 12 %   Monocytes Absolute 0.7 0.1 - 1.0 K/uL   Eosinophils Relative 1 0 - 5 %   Eosinophils Absolute 0.1 0.0 - 0.7 K/uL   Basophils Relative 1 0 - 1 %   Basophils Absolute 0.0 0.0 - 0.1 K/uL  Comprehensive metabolic panel     Status: Abnormal   Collection Time: 06/12/14  6:31 AM  Result Value Ref Range   Sodium 136 135 - 145 mmol/L   Potassium 3.7 3.5 - 5.1 mmol/L   Chloride 103 96 - 112 mmol/L   CO2 23 19 - 32 mmol/L   Glucose, Bld 166 (H) 70 - 99 mg/dL   BUN 11 6 - 23 mg/dL   Creatinine, Ser 0.81 0.50 - 1.35 mg/dL   Calcium 8.4 8.4 - 10.5 mg/dL   Total Protein 5.4 (L) 6.0 - 8.3 g/dL   Albumin 2.5 (L) 3.5 - 5.2 g/dL   AST 25 0 - 37 U/L   ALT 29 0 - 53 U/L   Alkaline Phosphatase 105 39 - 117 U/L   Total Bilirubin 1.2 0.3 - 1.2 mg/dL   GFR calc non Af Amer 87 (L) >90 mL/min   GFR calc Af Amer >90 >90 mL/min    Comment: (NOTE) The eGFR has been calculated using the CKD EPI equation. This calculation has not been validated in all clinical situations. eGFR's persistently <90 mL/min signify possible Chronic Kidney Disease.    Anion gap 10 5  - 15  Glucose, capillary     Status: Abnormal   Collection Time: 06/12/14  6:43 AM  Result Value Ref Range   Glucose-Capillary 155 (H) 70 - 99 mg/dL     HEENT: tape over lens  of eyeglass on RIght Cardio: RRR Resp: CTA B/L and unlabored GI: BS positive and NT, ND Extremity:  Pulses positive and No Edema Skin:   Intact Neuro: Alert/Oriented, Abnormal Sensory reduced sensation RUE adn RLE, Abnormal Motor 2- R delt , bi, tri, grip, HF, KN, ankle DF is trace and Other ataxia RUE Musc/Skel:  Edema RLE pretib, no erythema no tenderness , no pain with R Ankle ROM Gen NAD  Assessment/Plan: 1. Functional deficits secondary to Left thalamic ICH which require 3+ hours per day of interdisciplinary therapy in a comprehensive inpatient rehab setting. Physiatrist is providing close team supervision and 24 hour management of active medical problems listed below. Physiatrist and rehab team continue to assess barriers to discharge/monitor patient progress toward functional and medical goals. FIM:                                  Medical Problem List and Plan: 1. Functional deficits secondary to Left Thalamic ICH 2. DVT Prophylaxis/Anticoagulation: SCDs.Monitor for signs of DVT 3. Pain Management: Tylenol as needed 4. HCAP. Encourage incentive spirometer. Vancomycin and Zosyn discontinued and changed to Levaquin 06/11/2014. Check oxygen saturations every shift 5. Neuropsych: This patient is capable of making decisions on his own behalf. 6. Skin/Wound Care: Routine skin checks 7. Fluids/Electrolytes/Nutrition: Strict I and O's with follow-up chemistries 8. Mild ileus. Diet has been advanced to regular. No nausea vomiting. Establish bowel program 9. Diabetes mellitus and peripheral neuropathy. Latest hemoglobin A1c 7.8. Patient with history of insulin pump followed by Dr. Buddy Duty and endocrine services. Current sliding scale insulin 10. Coronary artery disease with balloon angioplasty.  No chest pain or shortness of breath. 11. Urinary retention. Continue Flomax. Foley tube remain in place. Follow-up urology services outpatient  LOS (Days) [place] E 06/12/2014, 8:08 AM

## 2014-06-12 NOTE — Evaluation (Addendum)
Speech Language Pathology Assessment and Plan  Patient Details  Name: Jeffrey [name] MRN: [id number] Date of Birth: 11-Dec-1940  SLP Diagnosis: Cognitive Impairments  Rehab Potential: Good ELOS: 21-28 days     Today's Date: 06/12/2014 SLP Individual Time: 0850-0950 SLP Individual Time Calculation (min): 60 min   Problem List:  Patient Active Problem List   Diagnosis Date Noted  . ICH (intracerebral hemorrhage) 06/11/2014  . Acute respiratory failure with hypoxia 06/05/2014  . HCAP (healthcare-associated pneumonia) 06/05/2014  . Adynamic ileus 06/05/2014  . Sepsis 06/05/2014  . Thrombocytopenia 06/05/2014  . Acute renal failure 06/05/2014  . Chronic diastolic heart failure, NYHA class 1 06/05/2014  . Ileus of unspecified type 06/05/2014  . Intracerebral hemorrhage 06/04/2014  . Tobacco abuse 06/03/2014  . Obesity 06/03/2014  . Nontraumatic intracranial hemorrhage 05/30/2014  . Benign paroxysmal positional vertigo 04/28/2014  . 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 Right carotid bruit   . Polyneuropathy, diabetic 08/05/2012  . Seasonal allergic rhinitis   . Trigger thumb of left hand 10/12/2011  . Acute chest wall pain 06/01/2011  . Epigastric abdominal pain 11/11/2010  . Gout   . Adenomatous polyps   . CAD (coronary artery disease)   . Essential hypertension   . Hyperlipidemia   . Type 2 diabetes, uncontrolled, with neuropathy   . Ejection fraction    Past Medical History:  Past Medical History  Diagnosis Date  . CAD (coronary artery disease)     PCI distal RCA ...2004, residual 70% LAD   /   ...nuclear...03/2007...no ischemia.Marland KitchenMarland Kitchenpreserved LV /  nuclear...03/03/2009...inferior scar..no ischemia..EF 51%  . Dyslipidemia     takes Atorvastatin daily  . Internal hemorrhoids   . Gout     takes Allopurinol daily and Colchicine as needed  . Seasonal allergic rhinitis   . Right carotid bruit   . Cyst of nasopharynx     per ENT Wilburn Cornelia  . Myocardial infarction 96yr ago   . Peripheral edema     takes Lasix daily  . HTN (hypertension)     takes Cardura,Metoprolol,[name],and Amlodipine daily  . Arthritis   . GERD (gastroesophageal reflux disease)     takes Omeprazole daily  . History of colon polyps   . Type 2 diabetes, uncontrolled, with neuropathy 1989    takes Invokana daily and has an insulin pump ([name]  . 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 Cataract extraction Bilateral 2010  . Elbow surgery Right 1997  . Balloon angioplasty, artery  1992, 2004    CAD, Dr. KRon Parker . Colonoscopy  04/03/2002    adenomatous polyp, int hemorrhoids  . Colonoscopy  07/18/2007    normal (Dr. SFuller Plan  . Rotator cuff repair  09/2011    left, with subacromial decompression  . Nasal septum surgery    . Colonoscopy  09/26/2012    tubular adenoma, sm int hem, rpt 5 yrs (Fuller Plan  . Tonsillectomy and adenoidectomy    . Eye lids raised    . Cardiac catheterization  2004  . Polypectomy N/A 05/27/2013    Procedure: ENDOSCOPIC NASOPHARYNGEAL MASS;  Surgeon: DJerrell Belfast MD    Assessment / Plan / Recommendation Clinical Impression   NGARREN GREENMANis a 74y.o. right handed male with history of remote tobacco abuse, hypertension, diabetes mellitus and peripheral neuropathy maintained on Invokanaand has an insulin pump (Dr. KBuddy Duty, coronary artery disease  with balloon angioplasty. Independent prior to admission living with his wife. Presented 05/30/2014 with right sided weakness and slurred speech. Cranial CT scan showed acute left thalamic and left internal capsule parenchymal hemorrhage compatible with hypertensive hemorrhage. MRI again notes left thalamic hemorrhage showing some increased intraventricular and bilateral subarachnoid hemorrhage since recent cranial CT.Tolerating a regular consistency diet. Patient was admitted for a comprehensive  rehabilitation program 06/03/2014 with therapies ongoing he was requiring total assist due to decreased standing balance. On 06/05/2014 patient with respiratory distress and oxygen saturations in the 80s and placed on NRB with pulmonary service consulted. Chest x-ray concerning for early pneumonia and placed on vancomycin as well as Zosyn. He was discharged to acute care services for ongoing workup. Follow-up swallow study per speech therapy continues on regular diet. Therapies resumed. Patient was admitted for comprehensive rehabilitation program on 06/11/2014.  SLP evaluation completed 06/12/2014 with the following results:  Pt presents with improvements from previous CIR admission and exhibits mild cognitive deficits for basic to semi-complex functional tasks characterized by decreased emergent awareness of deficits, decreased planning, organization, and error awareness during functional tasks, and decreased working and short term memory for recall of new information.  Furthermore, pt also presents with decreased alternating attention to tasks during structured evaluation measures in a quiet environment.  Given pt's high previous level of function, pt would benefit from skilled ST while inpatient in order to maximize functional independence and reduce burden of care prior to discharge.  Anticipate that pt will need 24/7 supervision and assistance for medication and financial management as well as ST follow up at discharge.   Skilled Therapeutic Interventions          Cognitive-linguistic evaluation completed with results and recommendations reviewed with patient.  During structured therapeutic tasks, pt benefited from extra time and mod assist verbal and visual cues for organization and to recognize and correct errors in the moment.  Pt was educated on the need for 24/7 supervision and assistance for medication and financial management at discharge as well as potential ST follow up.      SLP Assessment  Patient  will need skilled [facility] during CIR admission    Recommendations  Patient destination: Home (versus SNF) Follow up Recommendations: 24 hour supervision/assistance;Home Health SLP;Skilled Nursing facility Equipment Recommended: None recommended by SLP    SLP Frequency 3 to 5 out of 7 days   SLP Treatment/Interventions Speech/Language facilitation;Environmental controls;Patient/family education;Internal/external aids;Cognitive remediation/compensation;Cueing hierarchy;Functional tasks    Pain Pain Assessment Pain Assessment: No/denies pain Prior Functioning Cognitive/Linguistic Baseline: Within functional limits Type of Home: House  Lives With: Spouse Available Help at Discharge: Family Vocation: Retired  Industrial/product designer Term Goals: Week 1: SLP Short Term Goal 1 (Week 1): Pt willl utilize compensatory aids to facilitate recall of new information during basic, familiar tasks with min assist verbal cues  SLP Short Term Goal 2 (Week 1): Pt will recognize and correct errors in the moment when completing basic, familiar home management tasks with min assist verbal cues.   SLP Short Term Goal 3 (Week 1): Pt will return demonstration of 2 safety precautions during structured and unstructured therapeutic tasks with min assist verbal cues.    See FIM for current functional status Refer to Care Plan for Long Term Goals  Recommendations for other services: None  Discharge Criteria: Patient will be discharged from SLP if patient refuses treatment 3 consecutive times without medical reason, if treatment goals not met, if there is a change in medical  status, if patient makes no progress towards goals or if patient is discharged from hospital.  The above assessment, treatment plan, treatment alternatives and goals were discussed and mutually agreed upon: by patient  [name] 06/12/2014, 12:19 PM

## 2014-06-12 NOTE — Progress Notes (Signed)
Social Work Assessment and Plan Social Work Assessment and Plan  Patient Details  Name: Jeffrey [name] MRN: [id number] Date of Birth: 05-12-1940  Today's Date: 06/12/2014  Problem List:  Patient Active Problem List   Diagnosis Date Noted  . ICH (intracerebral hemorrhage) 06/11/2014  . Acute respiratory failure with hypoxia 06/05/2014  . HCAP (healthcare-associated pneumonia) 06/05/2014  . Adynamic ileus 06/05/2014  . Sepsis 06/05/2014  . Thrombocytopenia 06/05/2014  . Acute renal failure 06/05/2014  . Chronic diastolic heart failure, NYHA class 1 06/05/2014  . Ileus of unspecified type 06/05/2014  . Intracerebral hemorrhage 06/04/2014  . Tobacco abuse 06/03/2014  . Obesity 06/03/2014  . Nontraumatic intracranial hemorrhage 05/30/2014  . Benign paroxysmal positional vertigo 04/28/2014  . 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 Right carotid bruit   . Polyneuropathy, diabetic 08/05/2012  . Seasonal allergic rhinitis   . Trigger thumb of left hand 10/12/2011  . Acute chest wall pain 06/01/2011  . Epigastric abdominal pain 11/11/2010  . Gout   . Adenomatous polyps   . CAD (coronary artery disease)   . Essential hypertension   . Hyperlipidemia   . Type 2 diabetes, uncontrolled, with neuropathy   . Ejection fraction    Past Medical History:  Past Medical History  Diagnosis Date  . CAD (coronary artery disease)     PCI distal RCA ...2004, residual 70% LAD   /   ...nuclear...03/2007...no ischemia.Marland KitchenMarland Kitchenpreserved LV /  nuclear...03/03/2009...inferior scar..no ischemia..EF 51%  . Dyslipidemia     takes Atorvastatin daily  . Internal hemorrhoids   . Gout     takes Allopurinol daily and Colchicine as needed  . Seasonal allergic rhinitis   . Right carotid bruit   . Cyst of nasopharynx     per ENT Wilburn Cornelia  . Myocardial infarction 63yrs ago  . Peripheral edema     takes Lasix daily  . HTN (hypertension)     takes Cardura,Metoprolol,[name],and Amlodipine daily  . Arthritis   .  GERD (gastroesophageal reflux disease)     takes Omeprazole daily  . History of colon polyps   . Type 2 diabetes, uncontrolled, with neuropathy 1989    takes Invokana daily and has an insulin pump (Dr. [name])  . 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 Cataract extraction Bilateral 2010  . Elbow surgery Right 1997  . Balloon angioplasty, artery  1992, 2004    CAD, Dr. Ron Parker  . Colonoscopy  04/03/2002    adenomatous polyp, int hemorrhoids  . Colonoscopy  07/18/2007    normal (Dr. Fuller Plan)  . Rotator cuff repair  09/2011    left, with subacromial decompression  . Nasal septum surgery    . Colonoscopy  09/26/2012    tubular adenoma, sm int hem, rpt 5 yrs Fuller Plan)  . Tonsillectomy and adenoidectomy    . Eye lids raised    . Cardiac catheterization  2004  . Polypectomy N/A 05/27/2013    Procedure: ENDOSCOPIC NASOPHARYNGEAL MASS;  Surgeon: [name], MD   Social History:  reports that he has quit smoking. He has never used smokeless tobacco. He reports that he does not drink alcohol or use illicit drugs.  Family / Support Systems Marital Status: Married Patient Roles: Spouse, Parent, Other (Comment) (Fill in [place]) Spouse/Significant Other: Jeffrey [name]  [phone]  [phone] Children: Jeffrey [name]  [phone]   Jeffrey-[name]  [phone] [place] Other Supports: [employer] Family Anticipated  Caregiver: Wife would only be able to be there for pt, can not provide care due to her recent shoulder surgery and balance issues-probably will be NHP at discharge from rehab Ability/Limitations of Caregiver: Wife is a currently patient at [facility] where she is getting rehab for her shoulder and has been there for one month-she would not be able to provide any care for him at discharge Caregiver Availability: Other (Comment) (Probable NHP-wife in [place],  daughter in school and singel parent, [name] in [place]) Family Dynamics: Close knit family who are involved and supportive.  Very strong church family who are transporting wife here daily-afternoon to see pt.  [name] is in [place] but visits often and daughter is in school is graduating in May but is a single parent, so limited with how much she can do.  Family realisitc regarding the plan from here.  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  Wife is here very afternoon visiting   Abuse/Neglect Physical Abuse: Denies Verbal Abuse: Denies Sexual Abuse: Denies Exploitation of patient/patient's resources: Denies Self-Neglect: Denies  Emotional Status Pt's affect, behavior adn adjustment status: Pt is much better and able to participate in therapies and interview.  He is very independent and wants to regain this from here.  He is willing to work hard and do his part in his recovery.  His wife has all of the faith in the world he will along with God's help. Recent Psychosocial Issues: Other medical issues more stable than last itme he was here and trnasferred back to acute. Pyschiatric History: no history deferred depression screen due to pt feels it is not necessary-he has a strong faith and feels God will get him through this.  Wife confirms this and is limiting his church members from coming due to being tired and now wanting to be seen like this. Will provide support while here and continue to monitor coping. Substance Abuse History: No issues  Patient / Family Perceptions, Expectations & Goals Pt/Family understanding of illness  & functional limitations: Wife and pt can explain his stroke and are very hopeful he will do well here.  They both speak with the MD's and feel their questions are being answered. Wife reports so many people are praying for them, miracles happen. 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 improve, I want him to come home."  [employer]: Other (Comment) (Wife resident at [facility]) Premorbid Home Care/DME Agencies: None Transportation available at discharge: family and church members Resource referrals recommended: Neuropsychology, Support group (specify)  Discharge Planning Living Arrangements: Spouse/significant other Support Systems: Spouse/significant other, Children, Friends/neighbors, Social worker community Type of Residence: Private residence Insurance Resources: Multimedia programmer (specify) Psychologist, prison and probation services) Museum/gallery curator Resources: Fish farm manager, Other (Comment) (pension) Museum/gallery curator Screen Referred: No Living Expenses: Lives with family Money Management: Patient, Spouse Does the patient have any problems obtaining your medications?: No Home Management: Pt was doing most due to wife's shoulder issues and she was limited Patient/Family  Preliminary Plans: Wife is not able to provide any care to pt, so he would need to be mod/i which is probably not going to happen when discharged from here.  Probable plan is NHP from here and then hopefully home with his wife. His children are not able to provide 24 hr care due to families and jobs and distance. Social Work Anticipated Follow Up Needs: SNF, Support Group  Clinical Impression Pleasant gentleman who is motivated and ready to make  progress on rehab.  His wife is supportive and involved and a cheerleader for him.  She has her care meeting tomorrow and will find out how much longer she needs to stay at [facility]. Will work with all of discharge plans and provide support.  Elease Hashimoto 06/12/2014, 3:21 PM

## 2014-06-12 NOTE — Progress Notes (Signed)
Patient information reviewed and entered into eRehab system by Lucious Zou, RN, CRRN, PPS Coordinator.  Information including medical coding and functional independence measure will be reviewed and updated through discharge.    

## 2014-06-12 NOTE — Care Management Note (Signed)
    Page 1 of 1   06/12/2014     2:52:42 PM CARE MANAGEMENT NOTE 06/12/2014  Patient:  TORREN, MAFFEO   Account Number:  [id number]  Date Initiated:  06/10/2014  Documentation initiated by:  [facility]  Subjective/Objective Assessment:   HCAP/Sepsis     Action/Plan:   Anticipated DC Date:  06/11/2014   Anticipated DC Plan:  IP REHAB FACILITY      DC Planning Services  CM consult      Choice offered to / List presented to:             Status of service:  Completed, signed off Medicare Important Message given?  YES (If response is "NO", the following Medicare IM given date fields will be blank) Date Medicare IM given:  06/09/2014 Medicare IM given by:  [facility] Date Additional Medicare IM given:   Additional Medicare IM given by:    Discharge Disposition:  IP REHAB FACILITY  Per UR Regulation:  Reviewed for med. necessity/level of care/duration of stay  If discussed at [place] of Stay Meetings, dates discussed:    Comments:  06/09/2014 1635 Chart reviewed. Jonnie Finner RN CCM Case Mgmt phone [phone]

## 2014-06-12 NOTE — Progress Notes (Signed)
Physical Therapy Assessment and [name]  Patient Details  Name: Jeffrey [name] MRN: [id number] Date of Birth: 08-Jul-1940  PT Diagnosis: Abnormal posture, Abnormality of gait, Cognitive deficits, Difficulty walking, Hemiparesis dominant, Hypertonia, Impaired sensation and Muscle weakness Rehab Potential: Good ELOS: 20-24 days   Today's Date: 06/12/2014 PT Individual Time: 1300-1415 PT Individual Time Calculation (min): 75 min    Problem List:  Patient Active Problem List   Diagnosis Date Noted  . ICH (intracerebral hemorrhage) 06/11/2014  . Acute respiratory failure with hypoxia 06/05/2014  . HCAP (healthcare-associated pneumonia) 06/05/2014  . Adynamic ileus 06/05/2014  . Sepsis 06/05/2014  . Thrombocytopenia 06/05/2014  . Acute renal failure 06/05/2014  . Chronic diastolic heart failure, NYHA class 1 06/05/2014  . Ileus of unspecified type 06/05/2014  . Intracerebral hemorrhage 06/04/2014  . Tobacco abuse 06/03/2014  . Obesity 06/03/2014  . Nontraumatic intracranial hemorrhage 05/30/2014  . Benign paroxysmal positional vertigo 04/28/2014  . 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 Right carotid bruit   . Polyneuropathy, diabetic 08/05/2012  . Seasonal allergic rhinitis   . Trigger thumb of left hand 10/12/2011  . Acute chest wall pain 06/01/2011  . Epigastric abdominal pain 11/11/2010  . Gout   . Adenomatous polyps   . CAD (coronary artery disease)   . Essential hypertension   . Hyperlipidemia   . Type 2 diabetes, uncontrolled, with neuropathy   . Ejection fraction     Past Medical History:  Past Medical History  Diagnosis Date  . CAD (coronary artery disease)     PCI distal RCA ...2004, residual 70% LAD   /   ...nuclear...03/2007...no ischemia.Marland KitchenMarland Kitchenpreserved LV /  nuclear...03/03/2009...inferior scar..no ischemia..EF 51%  . Dyslipidemia     takes Atorvastatin daily  . Internal hemorrhoids   . Gout     takes Allopurinol daily and Colchicine as needed  . Seasonal allergic  rhinitis   . Right carotid bruit   . Cyst of nasopharynx     per ENT Jeffrey [name]  . Myocardial infarction 45yr ago  . Peripheral edema     takes Lasix daily  . HTN (hypertension)     takes Cardura,Metoprolol,[name],and Amlodipine daily  . Arthritis   . GERD (gastroesophageal reflux disease)     takes Omeprazole daily  . History of colon polyps   . Type 2 diabetes, uncontrolled, with neuropathy 1989    takes Invokana daily and has an insulin pump (Jeffrey [name]  . 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 Cataract extraction Bilateral 2010  . Elbow surgery Right 1997  . Balloon angioplasty, artery  1992, 2004    CAD, Dr. KRon [name] . Colonoscopy  04/03/2002    adenomatous polyp, int hemorrhoids  . Colonoscopy  07/18/2007    normal (Jeffrey [name] [name]  . Rotator cuff repair  09/2011    left, with subacromial decompression  . Nasal septum surgery    . Colonoscopy  09/26/2012    tubular adenoma, sm int hem, rpt 5 yrs (Jeffrey [name]  . Tonsillectomy and adenoidectomy    . Eye lids raised    . Cardiac catheterization  2004  . Polypectomy N/A 05/27/2013    Procedure: ENDOSCOPIC NASOPHARYNGEAL MASS;  Surgeon: Jeffrey [name]    Assessment & [name] Clinical Impression: Jeffrey [name] a 74y.o. right handed male with history of remote tobacco abuse, hypertension, diabetes mellitus and peripheral neuropathy maintained on Invokana  and has an insulin pump(Jeffrey [name]), coronary artery disease with balloon angioplasty. Independent prior to admission living with his wife. Presented 05/30/2014 with right sided weakness and slurred speech. Cranial CT scan showed acute left thalamic and left internal capsule parenchymal hemorrhage compatible with hypertensive hemorrhage. MRI again notes left thalamic hemorrhage showing some increased intraventricular and bilateral subarachnoid hemorrhage since  recent cranial CT. MRA of the head was negative. Carotid Dopplers with no ICA stenosis. Echocardiogram with ejection fraction of 18% grade 1 diastolic dysfunction. Neurology follow-up with conservative care. Tolerating a regular consistency diet. Bouts of loose stool with C. difficile specimen 06/02/2014 negative. Physical therapy evaluation completed and ongoing with recommendations of physical medicine rehabilitation consult. Patient was admitted for a comprehensive rehabilitation program 06/03/2014 with therapies ongoing he was requiring total assist due to decreased standing balance. On 06/05/2014 patient with respiratory distress and oxygen saturations in the 80s and placed on NRB with pulmonary service consulted. Chest x-ray concerning for early pneumonia and placed on vancomycin as well as Zosyn. He was discharged to acute care services for ongoing workup. Mild elevation in troponin felt to be related to demand ischemia. Echocardiogram repeated with ejection fraction 56% grade 1 diastolic dysfunction. CT of abdomen revealed mild ileus a rectal tube had been placed no bowel obstruction noted. Repeat CT of the chest persistent lower lung field infiltrates atelectasis advised to encourage incentive spirometer. His antibiotics have been changed to Levaquin as of 06/11/2014. Follow-up swallow study per speech therapy continues on regular diet. Bouts of urinary retention placed on Flomax as well as indwelling Foley catheter that would remain in place with plans to follow-up urology [facility] as an outpatient. Therapies resumed a slow progressive gains. Patient is admitted for comprehensive rehabilitation program  Patient currently requires total with mobility secondary to muscle weakness, decreased cardiorespiratoy endurance and impaired timing and sequencing, abnormal tone, unbalanced muscle activation and decreased coordination.  Prior to hospitalization, patient was independent  with mobility and lived with  Spouse in a House home.  Home access is 4Stairs to enter.  Patient will benefit from skilled PT intervention to maximize safe functional mobility, minimize fall risk and decrease caregiver burden for planned discharge home with 24 hour supervision.  Anticipate patient will benefit from follow up [place] at discharge.  PT - End of Session Activity Tolerance: Tolerates 30+ min activity with multiple rests Endurance Deficit: Yes PT Assessment Rehab Potential (ACUTE/IP ONLY): Good Barriers to Discharge: Decreased caregiver support Barriers to Discharge Comments: spouse unable to provide physical assist; unsure if pt will have 24/7 supervision  PT Patient demonstrates impairments in the following area(s): Balance;Endurance;Perception;Sensory;Motor PT [name] PT Intensity: Minimum of 1-2 x/day ,45 to 90 minutes (Simultaneous filing. User may not have seen previous data.) PT Frequency: 5 out of 7 days (Simultaneous filing. User may not have seen previous data.) PT Duration Estimated Length of Stay: 20-24 days (Simultaneous filing. User may not have seen previous data.) PT Treatment/Interventions: Ambulation/gait training;DME/adaptive equipment instruction;Functional mobility training;Neuromuscular re-education;Balance/vestibular training;Patient/family education;Therapeutic Activities;UE/LE Strength taining/ROM;UE/LE Coordination activities;Wheelchair propulsion/positioning;Therapeutic Exercise;Splinting/orthotics;Discharge planning;Stair training;Visual/perceptual remediation/compensation;Cognitive remediation/compensation;Disease management/prevention (Simultaneous filing. User may not have seen previous data.) PT Transfers Anticipated Outcome(s): Supervision-Min A PT Locomotion Anticipated Outcome(s): Supervision at w/c level; ambulatory at [facility] A level PT Recommendation Recommendations for Other Services: Neuropsych consult Follow Up Recommendations: Home health PT;24 hour supervision/assistance;Skilled  nursing facility;Other (comment) (Home vs SNf pending progress) Patient destination: Home (Home vs SNF pending family support) Equipment Recommended: To be determined  Skilled Therapeutic Intervention PT evaluation performed.  See below for detailed findings. Treatment initiated. Session focused on bed mobility, transfers, w/c mobility and gait. See below for detailed description of assist/cueing required with all functional mobility mentioned above. Educated pt on findings, goals, and [name] of care. Oriented pt to rehab unit, fall precautions. Pt will likely require reinforcement. No family present at this time.  Pt left in w/c in room with CSW and all needs within reach.   PT Evaluation Precautions/Restrictions   General   Vital SignsTherapy Vitals Temp: 99 F (37.2 C) Temp Source: Oral Pulse Rate: (!) 112 Resp: 17 BP: 132/85 mmHg Patient Position (if appropriate): Lying Oxygen Therapy SpO2: 93 % O2 Device: Not Delivered Pain  Pain Assessment  Pain Assessment: No/denies pain  Home Living/Prior Functioning  Home Living  Available Help at Discharge: Family;Friend(s)  Type of Home: House  Home Access: Stairs to enter  Technical brewer of Steps: 4  Entrance Stairs-Rails: Right;Left;Can reach both  Home Layout: One level  Additional Comments: wife with recent rotator cuff repair [facility]. Will need to clarify home setup.  Lives With: Spouse  Prior Function  Level of Independence: Independent with basic ADLs;Independent with transfers;Independent with gait;Independent with homemaking with ambulation  Able to Take Stairs?: Yes  Driving: Yes  Vocation: Retired  Leisure: Hobbies-yes (Comment) (Likes to hang out with his friends. )  Comments: Retired Theme park manager  Vision/Perception  Vision - Assessment  Eye Alignment: Impaired (comment) (right eye sits superiorly compared to the left)  Ocular Range of Motion: Other (comment) (restricted will continue to assess  in function)  Alignment/Gaze Preference: Head tilt (Head tilt to the right)  Tracking/Visual Pursuits: Decreased smoothness of horizontal tracking;Decreased smoothness of vertical tracking  Diplopia Assessment: Disappears with one eye closed;Objects split side to side;Only with left gaze;Only with right gaze;Present in primary gaze;Other (comment) (Pt with no diplopia in the inferior visual field both on the left and right side.)  Cognition  Overall Cognitive Status: Impaired/Different from baseline  Arousal/Alertness: Awake/alert  Orientation Level: Oriented X4  Attention: Alternating  Sustained Attention: Appears intact  Alternating Attention: Impaired  Alternating Attention Impairment: Functional basic  Memory: Impaired  Memory Impairment: Retrieval deficit;Decreased recall of new information;Other (comment)  Awareness: Appears intact  Awareness Impairment: Emergent impairment;Anticipatory impairment  Problem Solving: Impaired  Problem Solving Impairment: Functional basic  Executive Function: Self Monitoring;Self Correcting;Organizing  Organizing: Impaired  Organizing Impairment: Functional basic  Self Monitoring: Impaired  Self Monitoring Impairment: Functional basic  Self Correcting: Impaired  Self Correcting Impairment: Functional basic  Safety/Judgment: Appears intact  Comments: Pt able to state antiipatory awareness of needing assistance and not trying to get up out of the chair by himself.  Sensation  Sensation  Light Touch: Impaired by gross assessment  Light Touch Impaired Details: Absent RLE  Stereognosis: Not tested  Stereognosis Impaired Details: Absent RUE  Hot/Cold: Not tested  Proprioception: Impaired by gross assessment  Proprioception Impaired Details: Absent RLE  Coordination  Gross Motor Movements are Fluid and Coordinated: No  Fine Motor Movements are Fluid and Coordinated: No  Coordination and Movement Description: Pt's L LE lacks hip flexion and  coordination during ambulation. He is able to activate hip flexors in sitting, but no movement during MMT. Pt was able to advance L LE during gait, but had difficulty with fatigue. Motor  Motor  Motor: Hemiplegia;Abnormal postural alignment and control;Motor apraxia  Motor - Skilled Clinical Observations: R Hemiplegia Mobility Bed Mobility Bed Mobility: Supine to Sit;Sitting - Scoot to [place] of  Bed;Sit to Supine Supine to Sit: HOB elevated;1: +2 Total assist Supine to Sit: Patient Percentage: 10% Sitting - Scoot to Edge of Bed: 3: Mod assist Sitting - Scoot to [employer] of Bed Details: Tactile cues for weight shifting;Verbal cues for technique;Verbal cues for sequencing;Verbal cues for precautions/safety;Manual facilitation for weight shifting Sit to Supine: 1: +2 Total assist;HOB flat Sit to Supine: Patient Percentage: 10% Sit to Supine - Details: Verbal cues for technique;Verbal cues for sequencing;Verbal cues for precautions/safety;Manual facilitation for weight shifting;Manual facilitation for placement Transfers Transfers: Yes Sit to Stand: From bed;1: +2 Total assist Sit to Stand Details: Verbal cues for technique;Manual facilitation for weight shifting;Verbal cues for precautions/safety;Manual facilitation for placement Stand to Sit: 1: +2 Total assist Stand to Sit Details (indicate cue type and reason): Verbal cues for technique;Manual facilitation for weight shifting;Verbal cues for precautions/safety;Manual facilitation for placement;Tactile cues for sequencing;Tactile cues for weight shifting Squat Pivot Transfers: 1: +2 Total assist;With armrests Squat Pivot Transfer Details: Tactile cues for sequencing;Tactile cues for weight shifting;Tactile cues for placement;Verbal cues for sequencing;Verbal cues for technique;Verbal cues for precautions/safety;Manual facilitation for weight shifting Locomotion  Gait Gait: Yes Gait Pattern: Impaired Gait Pattern: Step-to pattern;Decreased step  length - left;Decreased step length - right;Right flexed knee in stance;Decreased hip/knee flexion - right;Decreased dorsiflexion - right;Decreased weight shift to right Stairs / Additional Locomotion Stairs: No Wheelchair Mobility Wheelchair Mobility: Yes Wheelchair Assistance: 1: +1 Total assist Wheelchair Assistance Details: Verbal cues for technique;Verbal cues for sequencing;Verbal cues for precautions/safety;Verbal cues for safe use of DME/AE;Tactile cues for sequencing;Tactile cues for placement Wheelchair Propulsion: Left upper extremity;Right lower extremity Wheelchair Parts Management: Needs assistance Distance: 10  Trunk/Postural Assessment  Cervical Assessment Cervical Assessment: Exceptions to [facility] (maintains cervical flexion and slight lateral flexion/tilt at rest) Thoracic Assessment Thoracic Assessment: Within Functional Limits Lumbar Assessment Lumbar Assessment: Within Functional Limits Postural Control Postural Control: Deficits on evaluation Trunk Control: Pt demonstrated lateral lean to R in sitting.   Balance Balance Balance Assessed: Yes Static Sitting Balance Static Sitting - Balance Support: Left upper extremity supported;Right upper extremity supported Static Sitting - Level of Assistance: 5: Stand by assistance Dynamic Sitting Balance Dynamic Sitting - Balance Support: No upper extremity supported Dynamic Sitting - Level of Assistance: 4: Min assist Sitting balance - Comments: pt. initially needing min guard assist for safe sitting at EOB, however progressed to supervision  Static Standing Balance Static Standing - Balance Support: Bilateral upper extremity supported Static Standing - Level of Assistance: 1: +2 Total assist (L HHA, SPT positioned under R UE) Dynamic Standing Balance Dynamic Standing - Balance Support: Right upper extremity supported Dynamic Standing - Level of Assistance: 1: +2 Total assist;Other (comment) (+2 3 musketeers) Extremity  Assessment      RLE Assessment RLE Assessment: Exceptions to [facility] RLE Strength Right Hip Flexion: 1/5 Right Knee Flexion: 3-/5 Right Knee Extension: 3/5 Right Ankle Dorsiflexion: 3-/5 Right Ankle Plantar Flexion: 3-/5 LLE Assessment LLE Assessment: Within Functional Limits  FIM:  FIM - Gaffer Devices: Arm rests  Bed/Chair Transfer: 1: Two helpers  FIM - Locomotion: Wheelchair  Distance: 10 (Pt propelled w/c with L UE/LE 10', but then required total A transport due to time constraints)  Locomotion: Wheelchair: 1: Total Assistance/staff pushes wheelchair (Pt<25%)  FIM - Locomotion: Ambulation  Locomotion: Ambulation Assistive Devices: Other (comment) (+2 '3 musketeers')  Ambulation/Gait Assistance: 1: +2 Total assist  Locomotion: Ambulation: 1: Two helpers  FIM - Locomotion: Stairs  Locomotion: Stairs: 0: Activity did  not occur     Refer to Care [name] for Long Term Goals  Recommendations for other services: Neuropsych  Discharge Criteria: Patient will be discharged from PT if patient refuses treatment 3 consecutive times without medical reason, if treatment goals not met, if there is a change in medical status, if patient makes no progress towards goals or if patient is discharged from hospital.  The above assessment, treatment [name], treatment alternatives and goals were discussed and mutually agreed upon: by patient  Glenice Ciccone 06/13/2014, 7:48 AM

## 2014-06-13 ENCOUNTER — Inpatient Hospital Stay (HOSPITAL_COMMUNITY): Payer: Medicare Other | Admitting: Speech Pathology

## 2014-06-13 ENCOUNTER — Inpatient Hospital Stay (HOSPITAL_COMMUNITY): Payer: Medicare Other

## 2014-06-13 ENCOUNTER — Inpatient Hospital Stay (HOSPITAL_COMMUNITY): Payer: Medicare Other | Admitting: Physical Therapy

## 2014-06-13 ENCOUNTER — Inpatient Hospital Stay (HOSPITAL_COMMUNITY): Payer: Medicare Other | Admitting: Occupational Therapy

## 2014-06-13 DIAGNOSIS — G8190 Hemiplegia, unspecified affecting unspecified side: Secondary | ICD-10-CM

## 2014-06-13 LAB — GLUCOSE, CAPILLARY
GLUCOSE-CAPILLARY: 233 mg/dL — AB (ref 70–99)
GLUCOSE-CAPILLARY: 188 mg/dL — AB (ref 70–99)
GLUCOSE-CAPILLARY: 172 mg/dL — AB (ref 70–99)
Glucose-Capillary: 168 mg/dL — ABNORMAL HIGH (ref 70–99)

## 2014-06-13 MED ORDER — BENZONATATE 100 MG PO CAPS
100.0000 mg | ORAL_CAPSULE | Freq: Three times a day (TID) | ORAL | Status: DC | PRN
  Filled 2014-06-13: qty 1

## 2014-06-13 MED ORDER — BENZONATATE 100 MG PO CAPS
100.0000 mg | ORAL_CAPSULE | Freq: Three times a day (TID) | ORAL | Status: AC
  Administered 2014-06-13 – 2014-06-16 (×9): 100 mg via ORAL
  Filled 2014-06-13 (×9): qty 1

## 2014-06-13 MED ORDER — LEVALBUTEROL HCL 0.63 MG/3ML IN NEBU: 0.6300 mg | INHALATION_SOLUTION | Freq: Four times a day (QID) | RESPIRATORY_TRACT | Status: DC | PRN

## 2014-06-13 MED ORDER — BENZONATATE 100 MG PO CAPS: 100.0000 mg | ORAL_CAPSULE | Freq: Three times a day (TID) | ORAL | Status: DC

## 2014-06-13 MED ORDER — BENZONATATE 100 MG PO CAPS: 100.0000 mg | ORAL_CAPSULE | Freq: Three times a day (TID) | ORAL | Status: DC | PRN

## 2014-06-13 MED ORDER — GUAIFENESIN-DM 100-10 MG/5ML PO SYRP
10.0000 mL | ORAL_SOLUTION | ORAL | Status: DC | PRN
  Administered 2014-06-13 – 2014-06-24 (×9): 10 mL via ORAL
  Filled 2014-06-13 (×9): qty 10

## 2014-06-13 NOTE — Progress Notes (Signed)
Occupational Therapy Session Note  Patient Details  Name: Jeffrey [name] MRN: [id number] Date of Birth: 1941-02-19  Today's Date: 06/13/2014 OT Individual Time: 0900-1005 OT Individual Time Calculation (min): 65 min    Short Term Goals: Week 1:  OT Short Term Goal 1 (Week 1): Pt will peform UB bathing with supervision sitting unsupported. OT Short Term Goal 2 (Week 1): Pt will complete LB bathing sit to stand with mod assist for 2 consecutive sessions. OT Short Term Goal 3 (Week 1): Pt will perform LB dressing with mod assist 2 consecutive sessions. OT Short Term Goal 4 (Week 1): Pt will demonstrate increased awareness of the RUE by placing it in his lap prior to transfers, and LB selfcare with no more than mod instructional cueing.  OT Short Term Goal 5 (Week 1): Pt will perform toilet transfer with max assist stand pivot.   Skilled Therapeutic Interventions/Progress Updates:    Pt performed transfer supine to sit EOB with max assist during session.  Mod assist for squat pivot transfer to the wheelchair from the EOB.  Noted at start of session pt repeatedly coughing with SOB.  Checked O2 sats on RA at 85% with HR increased to 143 BPM.  Applied 2 Ls nasal cannula with sats increasing to 91% and HR lowering to 119.  Pt sat in wheelchair in front of the sink for bathing.  Min instructional cueing to sequencing task with max instructional cueing and mod assist to integrate the RUE.  He continues to exhibit moderate ideomotor apraxia with attempted use.  Mod assist for shoulder support when using the RUE to wash under the left arm and the arm itself.  Mod assist for sit to stand and standing balance when Oxygen sats remained at 90-91% during session with HR elevating to 125-130 while working in sitting position.  Pt continued during session to have severe coughing episodes that would not stop.  Nursing notified of situation and medication requested.  Pt assisted with LB dressing secondary to decreased  time and SLP present at end of session.   Therapy Documentation Precautions:  Precautions Precautions: Fall Precaution Comments: due to right hemiplegia, diplopia Restrictions Weight Bearing Restrictions: No  Pain: Pain Assessment Pain Assessment: No/denies pain ADL: See FIM for current functional status  Therapy/Group: Individual Therapy  Gwendolin Briel OTR/L 06/13/2014, 12:36 PM

## 2014-06-13 NOTE — Progress Notes (Signed)
Speech Language Pathology Daily Session Note  Patient Details  Name: Jeffrey [name] MRN: [id number] Date of Birth: Aug 24, 1940  Today's Date: 06/13/2014 SLP Individual Time: 1005-1100 SLP Individual Time Calculation (min): 55 min  Short Term Goals: Week 1: SLP Short Term Goal 1 (Week 1): Pt willl utilize compensatory aids to facilitate recall of new information during basic, familiar tasks with min assist verbal cues  SLP Short Term Goal 2 (Week 1): Pt will recognize and correct errors in the moment when completing basic, familiar home management tasks with min assist verbal cues.   SLP Short Term Goal 3 (Week 1): Pt will return demonstration of 2 safety precautions during structured and unstructured therapeutic tasks with min assist verbal cues.    Skilled Therapeutic Interventions: Skilled treatment session focused on cognitive goals. Upon arrival, patient has upright in wheelchair completing session with OT. SLP facilitated session by providing Min A verbal and question cues for patient to self-monitor and correct errors during a mildly complex money management task of balancing a checkbook.  SLP also facilitated session by providing education in regards to the patient's current medications. Patient was able to recall 2 of his 6 medications independently but required total A for the remainder. Patient was also Mod I for selective attention to task in a mildly distracting environment for 30 minutes.  Patient left upright in wheelchair with all needs within reach. Continue with current plan of care.    FIM:  Comprehension Comprehension Mode: Auditory Comprehension: 5-Understands basic 90% of the time/requires cueing < 10% of the time Expression Expression Mode: Verbal Expression: 5-Expresses basic 90% of the time/requires cueing < 10% of the time. Social Interaction Social Interaction: 5-Interacts appropriately 90% of the time - Needs monitoring or encouragement for participation or  interaction. Problem Solving Problem Solving: 4-Solves basic 75 - 89% of the time/requires cueing 10 - 24% of the time Memory Memory: 3-Recognizes or recalls 50 - 74% of the time/requires cueing 25 - 49% of the time  Pain Pain Assessment Pain Assessment: No/denies pain  Therapy/Group: Individual Therapy  [name], [place] 06/13/2014, 12:19 PM

## 2014-06-13 NOTE — Plan of Care (Signed)
Problem: RH Ambulation Goal: LTG Patient will ambulate in community environment (PT) LTG: Patient will ambulate in community environment, # of feet with assistance (PT).  Outcome: Not Applicable Date Met:  09/38/18 Predict ambulation will not be primary means of locomotion in the community.

## 2014-06-13 NOTE — Progress Notes (Signed)
Physical Therapy Session Note  Patient Details  Name: Jeffrey [name] MRN: [id number] Date of Birth: 1940/09/07  Today's Date: 06/13/2014 PT Individual Time: 1130-1200 PT Individual Time Calculation (min): 30 min   Short Term Goals: Week 1:  PT Short Term Goal 1 (Week 1): Pt will perform supine<>sit with mod A and 50% cueing with HOB elevated and rails.  PT Short Term Goal 2 (Week 1): Pt will perform bed<> w/c transfer with total A +1 and 75% cueing. PT Short Term Goal 3 (Week 1): Pt will ambulate of 20' with +2 A and 100% cueing.  PT Short Term Goal 4 (Week 1): Pt will propel w/c 50' in controlled environment with mod A and 50% cues.  PT Short Term Goal 5 (Week 1): Pt will perform dynamic sitting balance without UE support for >5 mins with supervision and 25% cueing.    Skilled Therapeutic Interventions/Progress Updates:   Session focused on education for positioning and RUE management and w/c mobility and propulsion on unit with L hemi technique and min to mod A needed to initiate and cues for technique. Rest breaks as needed due to fatigue and cough. All needs in reach at end of session.   Therapy Documentation Precautions:  Precautions Precautions: Fall Precaution Comments: due to right hemiplegia, diplopia Restrictions Weight Bearing Restrictions: No Pain:  No complaints of pain.    See FIM for current functional status  Therapy/Group: Individual Therapy  Canary Brim Ivory Broad, PT, DPT  06/13/2014, 12:13 PM

## 2014-06-13 NOTE — Progress Notes (Signed)
74 y.o. right handed male with history of remote tobacco abuse, hypertension, diabetes mellitus and peripheral neuropathy maintained on Invokana and has an insulin pump(Dr. Buddy Duty), coronary artery disease with balloon angioplasty. Independent prior to admission living with his wife. Presented 05/30/2014 with right sided weakness and slurred speech. Cranial CT scan showed acute left thalamic and left internal capsule parenchymal hemorrhage compatible with hypertensive hemorrhage. MRI again notes left thalamic hemorrhage showing some increased intraventricular and bilateral subarachnoid hemorrhage since recent cranial CT. MRA of the head was negative. Carotid Dopplers with no ICA stenosis  Subjective/Complaints: No issues overnite, denies SOB  Objective: Vital Signs: Blood pressure 132/85, pulse 112, temperature 99 F (37.2 C), temperature source Oral, resp. rate 17, height 5' 8"  (1.727 m), weight 85.1 kg (187 lb 9.8 oz), SpO2 93 %. No results found. Results for orders placed or performed during the hospital encounter of 06/11/14 (from the past 72 hour(s))  Glucose, capillary     Status: Abnormal   Collection Time: 06/11/14  9:30 PM  Result Value Ref Range   Glucose-Capillary 173 (H) 70 - 99 mg/dL  CBC WITH DIFFERENTIAL     Status: Abnormal   Collection Time: 06/12/14  6:31 AM  Result Value Ref Range   WBC 8.3 4.0 - 10.5 K/uL   RBC 4.61 4.22 - 5.81 MIL/uL   Hemoglobin 14.1 13.0 - 17.0 g/dL   HCT 40.8 39.0 - 52.0 %   MCV 88.5 78.0 - 100.0 fL   MCH 30.6 26.0 - 34.0 pg   MCHC 34.6 30.0 - 36.0 g/dL   RDW 13.3 11.5 - 15.5 %   Platelets 199 150 - 400 K/uL   Neutrophils Relative % 82 (H) 43 - 77 %   Neutro Abs 6.8 1.7 - 7.7 K/uL   Lymphocytes Relative 8 (L) 12 - 46 %   Lymphs Abs 0.7 0.7 - 4.0 K/uL   Monocytes Relative 8 3 - 12 %   Monocytes Absolute 0.7 0.1 - 1.0 K/uL   Eosinophils Relative 1 0 - 5 %   Eosinophils Absolute 0.1 0.0 - 0.7 K/uL   Basophils Relative 1 0 - 1 %   Basophils  Absolute 0.0 0.0 - 0.1 K/uL  Comprehensive metabolic panel     Status: Abnormal   Collection Time: 06/12/14  6:31 AM  Result Value Ref Range   Sodium 136 135 - 145 mmol/L   Potassium 3.7 3.5 - 5.1 mmol/L   Chloride 103 96 - 112 mmol/L   CO2 23 19 - 32 mmol/L   Glucose, Bld 166 (H) 70 - 99 mg/dL   BUN 11 6 - 23 mg/dL   Creatinine, Ser 0.81 0.50 - 1.35 mg/dL   Calcium 8.4 8.4 - 10.5 mg/dL   Total Protein 5.4 (L) 6.0 - 8.3 g/dL   Albumin 2.5 (L) 3.5 - 5.2 g/dL   AST 25 0 - 37 U/L   ALT 29 0 - 53 U/L   Alkaline Phosphatase 105 39 - 117 U/L   Total Bilirubin 1.2 0.3 - 1.2 mg/dL   GFR calc non Af Amer 87 (L) >90 mL/min   GFR calc Af Amer >90 >90 mL/min    Comment: (NOTE) The eGFR has been calculated using the CKD EPI equation. This calculation has not been validated in all clinical situations. eGFR's persistently <90 mL/min signify possible Chronic Kidney Disease.    Anion gap 10 5 - 15  Glucose, capillary     Status: Abnormal   Collection Time: 06/12/14  6:43  AM  Result Value Ref Range   Glucose-Capillary 155 (H) 70 - 99 mg/dL  Glucose, capillary     Status: Abnormal   Collection Time: 06/12/14 11:47 AM  Result Value Ref Range   Glucose-Capillary 147 (H) 70 - 99 mg/dL   Comment 1 Notify RN   Glucose, capillary     Status: Abnormal   Collection Time: 06/12/14  4:53 PM  Result Value Ref Range   Glucose-Capillary 156 (H) 70 - 99 mg/dL   Comment 1 Notify RN   Glucose, capillary     Status: Abnormal   Collection Time: 06/12/14  9:17 PM  Result Value Ref Range   Glucose-Capillary 190 (H) 70 - 99 mg/dL  Glucose, capillary     Status: Abnormal   Collection Time: 06/13/14  6:41 AM  Result Value Ref Range   Glucose-Capillary 168 (H) 70 - 99 mg/dL     HEENT: tape over lens  of eyeglass on RIght Cardio: RRR Resp: CTA B/L and unlabored GI: BS positive and NT, ND Extremity:  Pulses positive and No Edema Skin:   Intact Neuro: Alert/Oriented, Abnormal Sensory reduced sensation RUE  adn RLE, Abnormal Motor 2- R delt , bi, tri, grip, HF, KN, ankle DF is trace and Other ataxia RUE Musc/Skel:  Edema RLE pretib, no erythema no tenderness , no pain with R Ankle ROM Gen NAD  Assessment/Plan: 1. Functional deficits secondary to Left thalamic ICH which require 3+ hours per day of interdisciplinary therapy in a comprehensive inpatient rehab setting. Physiatrist is providing close team supervision and 24 hour management of active medical problems listed below. Physiatrist and rehab team continue to assess barriers to discharge/monitor patient progress toward functional and medical goals. FIM: FIM - Bathing Bathing Steps Patient Completed: Chest, Abdomen, Right upper leg, Left upper leg, Right Arm Bathing: 3: Mod-Patient completes 5-7 16f10 parts or 50-74%  FIM - Upper Body Dressing/Undressing Upper body dressing/undressing steps patient completed: Thread/unthread left sleeve of pullover shirt/dress, Put head through opening of pull over shirt/dress Upper body dressing/undressing: 3: Mod-Patient completed 50-74% of tasks FIM - Lower Body Dressing/Undressing Lower body dressing/undressing: 1: Total-Patient completed less than 25% of tasks        FIM - BControl and instrumentation engineerDevices: Arm rests Bed/Chair Transfer: 1: Two helpers, 3: Sit > Supine: Mod A (lifting assist/Pt. 50-74%/lift 2 legs)  FIM - Locomotion: Wheelchair Distance: 10 Locomotion: Wheelchair: 1: Total Assistance/staff pushes wheelchair (Pt<25%) FIM - Locomotion: Ambulation Locomotion: Ambulation Assistive Devices: Other (comment) (+2 '3 musketeers') Ambulation/Gait Assistance: 1: +2 Total assist Locomotion: Ambulation: 1: Two helpers  Comprehension Comprehension Mode: Auditory Comprehension: 5-Understands basic 90% of the time/requires cueing < 10% of the time  Expression Expression Mode: Verbal Expression: 5-Expresses basic 90% of the time/requires cueing < 10% of the  time.  Social Interaction Social Interaction: 5-Interacts appropriately 90% of the time - Needs monitoring or encouragement for participation or interaction.  Problem Solving Problem Solving: 3-Solves basic 50 - 74% of the time/requires cueing 25 - 49% of the time  Memory Memory: 3-Recognizes or recalls 50 - 74% of the time/requires cueing 25 - 49% of the time  Medical Problem List and Plan: 1. Functional deficits secondary to Left Thalamic ICH 2. DVT Prophylaxis/Anticoagulation: SCDs.Monitor for signs of DVT 3. Pain Management: Tylenol as needed 4. HCAP. Encourage incentive spirometer. Vancomycin and Zosyn discontinued and changed to Levaquin 06/11/2014. Check oxygen saturations every shift 5. Neuropsych: This patient is capable of making decisions on his own  behalf. 6. Skin/Wound Care: Routine skin checks 7. Fluids/Electrolytes/Nutrition: Strict I and O's with follow-up chemistries 8. Mild ileus. Diet has been advanced to regular. No nausea vomiting. Establish bowel program 9. Diabetes mellitus and peripheral neuropathy. Latest hemoglobin A1c 7.8. Patient with history of insulin pump followed by Dr. Buddy Duty and endocrine services. Current sliding scale insulin 10. Coronary artery disease with balloon angioplasty. No chest pain or shortness of breath. 11. Urinary retention. Continue Flomax. Foley tube remain in place. Follow-up urology services outpatient 12.  Peripheral edema related to weakness RLE, TED hose LOS (Days) 2 A FACE TO FACE EVALUATION WAS PERFORMED  Jeffrey [name] E 06/13/2014, 7:58 AM

## 2014-06-13 NOTE — Progress Notes (Signed)
Physical Therapy Session Note  Patient Details  Name: Jeffrey [name] MRN: [id number] Date of Birth: [birth year]  Today's Date: 06/13/2014 PT Individual Time: 1300-1400 PT Individual Time Calculation (min): 60 min   Short Term Goals: Week 1:  PT Short Term Goal 1 (Week 1): Pt will perform supine<>sit with mod A and 50% cueing with HOB elevated and rails.  PT Short Term Goal 2 (Week 1): Pt will perform bed<> w/c transfer with total A +1 and 75% cueing. PT Short Term Goal 3 (Week 1): Pt will ambulate of 20' with +2 A and 100% cueing.  PT Short Term Goal 4 (Week 1): Pt will propel w/c 50' in controlled environment with mod A and 50% cues.  PT Short Term Goal 5 (Week 1): Pt will perform dynamic sitting balance without UE support for >5 mins with supervision and 25% cueing.    Skilled Therapeutic Interventions/Progress Updates:  Pt received in w/c in room; agreeable to therapy.  Noted resting HR 115-122 bpm before starting activity.  RN made and PA Pam Love made aware; PA gave verbal order for supplemental O2 for SpO2 <90%.  Pt propelled w/c mod A with L UE/LE (L hemi technique) 50' x2 towards gym, requiring rest break in between each round.  Mod verbal and manual facilitation of L LE provided for w/c propulsion.  Pt transferred w/c<>mat +2A for safety.  Performed sit>stand from mat table and attempted ambulation with patient with +2A ('3 musketeers') but pt started to cough with SOB.  Applied 3L O2 through nasal cannula because of O2 desaturation to 86% and HR of 140.  RN made aware and present soon there after. Pt was transported in w/c total A back to room due to pt fatigue.  Once vitals returned to [facility], pt transferred w/c>bed, and sit>supine +2A per RN's request, and left supine in bed with nurse techs present and pt in no apparent distress.            Therapy Documentation Precautions:  Precautions Precautions: Fall Precaution Comments: due to right hemiplegia, diplopia Restrictions Weight  Bearing Restrictions: No Vital Signs: Therapy Vitals Pulse Rate: (!) 111 Resp: (!) 30 (Immediately following w/c mobility) Patient Position (if appropriate): Sitting Oxygen Therapy SpO2: 93 % Pain: Pain Assessment Pain Assessment:  (entry made in error) Locomotion : Wheelchair Mobility Distance: 40   See FIM for current functional status  Therapy/Group: Individual Therapy  Angelina Venard 06/13/2014, 2:16 PM

## 2014-06-14 ENCOUNTER — Inpatient Hospital Stay (HOSPITAL_COMMUNITY): Payer: Medicare Other | Admitting: Speech Pathology

## 2014-06-14 ENCOUNTER — Inpatient Hospital Stay (HOSPITAL_COMMUNITY): Payer: Medicare Other | Admitting: Physical Therapy

## 2014-06-14 ENCOUNTER — Inpatient Hospital Stay (HOSPITAL_COMMUNITY): Payer: Medicare Other | Admitting: Occupational Therapy

## 2014-06-14 LAB — GLUCOSE, CAPILLARY
GLUCOSE-CAPILLARY: 204 mg/dL — AB (ref 70–99)
Glucose-Capillary: 192 mg/dL — ABNORMAL HIGH (ref 70–99)
Glucose-Capillary: 197 mg/dL — ABNORMAL HIGH (ref 70–99)
Glucose-Capillary: 205 mg/dL — ABNORMAL HIGH (ref 70–99)

## 2014-06-14 NOTE — Progress Notes (Signed)
Speech Language Pathology Daily Session Note  Patient Details  Name: Jeffrey [name] MRN: [id number] Date of Birth: 1941-01-19  Today's Date: 06/14/2014 SLP Individual Time: 0865-7846 SLP Individual Time Calculation (min): 45 min  Short Term Goals: Week 1: SLP Short Term Goal 1 (Week 1): Pt willl utilize compensatory aids to facilitate recall of new information during basic, familiar tasks with min assist verbal cues  SLP Short Term Goal 2 (Week 1): Pt will recognize and correct errors in the moment when completing basic, familiar home management tasks with min assist verbal cues.   SLP Short Term Goal 3 (Week 1): Pt will return demonstration of 2 safety precautions during structured and unstructured therapeutic tasks with min assist verbal cues.    Skilled Therapeutic Interventions: Skilled ST intervention provided with focus on cognitive goals. Pt seen in room for ST tx, at bedside. Pt oriented x4 with use of visual aids and participated in all tx tasks, willingly. Pt demonstrated independence with call bell, and described 3 scenarios, in which call bell use would be appropriate. Complex word problem puzzle completed with mod verbal cues required from clinician. Pt noted with difficulty with organization and comprehension of written clues. Pt required additional time and simplified directions to complete tasks at 70% accuracy. Med management tasks completed using AM/PM pill organizer with 75% accuracy, min A.    FIM:  Comprehension Comprehension Mode: Auditory Comprehension: 5-Understands basic 90% of the time/requires cueing < 10% of the time Expression Expression Mode: Verbal Expression: 5-Expresses basic 90% of the time/requires cueing < 10% of the time. Social Interaction Social Interaction: 5-Interacts appropriately 90% of the time - Needs monitoring or encouragement for participation or interaction. Problem Solving Problem Solving: 4-Solves basic 75 - 89% of the time/requires cueing  10 - 24% of the time Memory Memory: 3-Recognizes or recalls 50 - 74% of the time/requires cueing 25 - 49% of the time  Pain Pain Assessment Pain Assessment: No/denies pain  Therapy/Group: Individual Therapy  Bentlee [place], Bernerd Pho 06/14/2014, 10:32 AM

## 2014-06-14 NOTE — Progress Notes (Signed)
Physical Therapy Session Note  Patient Details  Name: Jeffrey [name] MRN: [id number] Date of Birth: [birth year]  Today's Date: 06/14/2014 PT Individual Time: 1400-1500 PT Individual Time Calculation (min): 60 min  Short Term Goals: Week 1:  PT Short Term Goal 1 (Week 1): Pt will perform supine<>sit with mod A and 50% cueing with HOB elevated and rails.  PT Short Term Goal 2 (Week 1): Pt will perform bed<> w/c transfer with total A +1 and 75% cueing. PT Short Term Goal 3 (Week 1): Pt will ambulate of 20' with +2 A and 100% cueing.  PT Short Term Goal 4 (Week 1): Pt will propel w/c 50' in controlled environment with mod A and 50% cues.  PT Short Term Goal 5 (Week 1): Pt will perform dynamic sitting balance without UE support for >5 mins with supervision and 25% cueing.    Skilled Therapeutic Interventions/Progress Updates:    Pt received seated in w/c accompanied by wife on 2L/min supplemental O2, [place]. Pt expressing fatigue but agreeable to therapy. Vital signs WNL at baseline/rest. Pt performed w/c mobility x130' in controlled environment with L hemi technique and min A for turning to R side, tactile cueing at R knee for increased weightbearing. Pt performed low pivot transfer from w/c <> NuStep seat with +2A, manual stabilization RLE to prevent excessive abduction, manual facilitation for full anterior weight shift, and 75% cueing for attention to RUE, setup and sequencing of transfer. Pt performed NuStep x11 minutes total without resistance for RUE/LE NMR; R WellGrip utilized (due to RUE inattention) to maintain R hand position; cueing provided during initial 3 minutes for RLE attention with effective carryover for remainder of activity. During initial 9.5 minutes, pt  Used all 4 extremities on NuStep and during final 2.5 minutes, pt utilized bilat LE's and RUE only for forced use. Vitals monitored every 3 minutes and remainder grossly WNL throughout; see vital signs for detailed readings. Pt  performed w/c mobility x25' with bilat LE's for NMR with max A, manual facilitation of RLE extension pattern and tactile cueing for RLE weightbearing during RLE flexion. Session ended in pt room, where pt was left seated in w/c with wife present and all needs within reach. Pt on 2 L/min supplemental O2, [place] with vitals WNL.  Therapy Documentation Precautions:  Precautions Precautions: Fall Precaution Comments: due to right hemiplegia, diplopia Restrictions Weight Bearing Restrictions: No Vital Signs: Therapy Vitals Pulse Rate: (!) 101 (after NuStep x3 minutes) Patient Position (if appropriate): Sitting Oxygen Therapy SpO2: 98 % O2 Device: Nasal Cannula O2 Flow Rate (L/min): 2 L/min Pain: Pain Assessment Pain Assessment: No/denies pain Locomotion : Wheelchair Mobility Distance: 130   See FIM for current functional status  Therapy/Group: Individual Therapy  [place], Malva Cogan 06/14/2014, 2:53 PM

## 2014-06-14 NOTE — IPOC Note (Signed)
Overall Plan of Care [facility]) Patient Details Name: Jeffrey [name] MRN: [id number] DOB: September 12, 1940  Admitting Diagnosis: LEFT THALAMIC ICH  Hospital Problems: Principal Problem:   Nontraumatic intracranial hemorrhage Active Problems:   Essential hypertension   Type 2 diabetes, uncontrolled, with neuropathy   Ileus of unspecified type     Functional Problem List: Nursing Bladder, Bowel, Edema, Endurance, Medication Management, Nutrition, Pain, Perception, Safety, Sensory, Skin Integrity  PT Balance, Endurance, Perception, Sensory, Motor  OT Balance  SLP Cognition  TR         Basic ADL's: OT Grooming, Bathing, Dressing, Toileting, Eating     Advanced  ADL's: OT Simple Meal Preparation     Transfers: PT Bed to Chair, Bed Mobility, Car  OT Toilet     Locomotion: PT Ambulation, Wheelchair Mobility, Stairs     Additional Impairments: OT Fuctional Use of Upper Extremity  SLP Social Cognition   Problem Solving, Memory, Attention, Awareness  TR      Anticipated Outcomes Item Anticipated Outcome  Self Feeding modified independent  Swallowing      Basic self-care  min assist level  Toileting  min assist level   Bathroom Transfers min assist level  Bowel/Bladder  Continent of bowel and bladder  Transfers  Supervision-Min A  Locomotion  Supervision at w/c level; ambulatory at [employer] A level  Communication     Cognition  Supervision   Pain  <2 on a 0-10 scale  Safety/Judgment  mod assist   Therapy Plan: PT Intensity: Minimum of 1-2 x/day ,45 to 90 minutes (Simultaneous filing. User may not have seen previous data.) PT Frequency: 5 out of 7 days (Simultaneous filing. User may not have seen previous data.) PT Duration Estimated Length of Stay: 20-24 days (Simultaneous filing. User may not have seen previous data.) OT Intensity: Minimum of 1-2 x/day, 45 to 90 minutes OT Duration/Estimated Length of Stay: 21-23 days SLP Intensity: Minumum of 1-2 x/day, 30 to 90  minutes SLP Frequency: 3 to 5 out of 7 days SLP Duration/Estimated Length of Stay: 21-28 days        Team Interventions: Nursing Interventions Patient/Family Education, Bladder Management, Bowel Management, Disease Management/Prevention, Pain Management, Medication Management, Skin Care/Wound Management, Discharge Planning, Psychosocial Support  PT interventions Ambulation/gait training, DME/adaptive equipment instruction, Functional mobility training, Neuromuscular re-education, Balance/vestibular training, Patient/family education, Therapeutic Activities, UE/LE Strength taining/ROM, UE/LE Coordination activities, Wheelchair propulsion/positioning, Therapeutic Exercise, Splinting/orthotics, Discharge planning, Stair training, Visual/perceptual remediation/compensation, Cognitive remediation/compensation, Disease management/prevention (Simultaneous filing. User may not have seen previous data.)  OT Interventions Balance/vestibular training, Cognitive remediation/compensation, Discharge planning, Disease mangement/prevention, DME/adaptive equipment instruction, Functional mobility training, Neuromuscular re-education, Pain management, Patient/family education, Psychosocial support, Self Care/advanced ADL retraining, Splinting/orthotics, Therapeutic Activities, Therapeutic Exercise, UE/LE Strength taining/ROM, UE/LE Coordination activities, Visual/perceptual remediation/compensation, Community reintegration, Functional electrical stimulation  SLP Interventions Speech/Language facilitation, Environmental controls, Patient/family education, Internal/external aids, Cognitive remediation/compensation, English as a second language teacher, Functional tasks  TR Interventions    SW/CM Interventions Discharge Planning, Psychosocial Support, Patient/Family Education    Team Discharge Planning: Destination: PT-Home (Home vs SNF pending family support) ,OT- Home , SLP-Home (versus SNF) Projected Follow-up: PT-Home health PT, 24 hour  supervision/assistance, Skilled nursing facility, Other (comment) (Home vs SNf pending progress), OT-  Home health OT, SLP-24 hour supervision/assistance, Home Health SLP, Skilled Nursing facility Projected Equipment Needs: PT-To be determined, OT- 3 in 1 bedside comode, Tub/shower bench, SLP-None recommended by SLP Equipment Details: PT- , OT-  Patient/family involved in discharge planning: PT- Patient,  OT-Patient, SLP-Patient  MD ELOS:  18-21 d Medical Rehab Prognosis:  Good Assessment: Presented 05/30/2014 with right sided weakness and slurred speech. Cranial CT scan showed acute left thalamic and left internal capsule parenchymal hemorrhage compatible with hypertensive hemorrhage. MRI again notes left thalamic hemorrhage showing some increased intraventricular and bilateral subarachnoid hemorrhage since recent cranial CT. MRA of the head was negative. Carotid Dopplers with no ICA stenosis   Now requiring 24/7 Rehab RN,MD, as well as CIR level PT, OT and SLP.  Treatment team will focus on ADLs and mobility with goals set at [facility]  See Team Conference Notes for weekly updates to the plan of care

## 2014-06-14 NOTE — Progress Notes (Signed)
Physical Therapy Session Note  Patient Details  Name: Jeffrey [name] MRN: [id number] Date of Birth: 1940/08/18  Today's Date: 06/14/2014 PT Individual Time: 0945-1030 PT Individual Time Calculation (min): 45 min   Short Term Goals: Week 1:  PT Short Term Goal 1 (Week 1): Pt will perform supine<>sit with mod A and 50% cueing with HOB elevated and rails.  PT Short Term Goal 2 (Week 1): Pt will perform bed<> w/c transfer with total A +1 and 75% cueing. PT Short Term Goal 3 (Week 1): Pt will ambulate of 20' with +2 A and 100% cueing.  PT Short Term Goal 4 (Week 1): Pt will propel w/c 50' in controlled environment with mod A and 50% cues.  PT Short Term Goal 5 (Week 1): Pt will perform dynamic sitting balance without UE support for >5 mins with supervision and 25% cueing.    Skilled Therapeutic Interventions/Progress Updates:  Pt received lying semi-reclined in bed; agreeable to therapy. Session focused on bed mobility, w/c mobility, and NMR.  Pt transferred supine>sit mod A with HOB elevated, bed rail, and mod cues for technique.  Pt transferred bed/mat<>w/c +2A for safety with mod cues for technique.  Pt propelled w/c mod A with L hemi technique 110' to gym, with mod verbal cues and manual facilitation of L LE provided for w/c propulsion. Lying supine on mat table, patient able to perform bridging to move toward the middle of mat with mod verbal cues and manual stabilization of R/L LEs.  Pt transferred supine>L side lying with min A.  In L side-lying with powder board placed under R LE, pt performed hip flexion/extension and knee flexion/extension through a gravity eliminated position with max verbal cues, a mirror as a visual feedback, and manual facilitation of movements before activating LE hip/knee flexor/extensor muscles and moving independently. Noted activation of R ankle dorsiflexors with global R LE flexion pattern, but could not selectively control.  Pt's SpO2 remained above 90% with 3L  supplemental O2 throughout entire session. Pt transported in w/c total A back to room due to time constraints and left in w/c with nurse present to change leaking foley catheter bag.      Therapy Documentation Precautions:  Precautions Precautions: Fall Precaution Comments: due to right hemiplegia, diplopia Restrictions Weight Bearing Restrictions: No Vital Signs: Therapy Vitals Pulse Rate: (!) 111 (immediately after transfer w/c>mat) Patient Position (if appropriate): Lying Oxygen Therapy SpO2: 92 % O2 Device: Nasal Cannula O2 Flow Rate (L/min): 3 L/min Pain: Pain Assessment Pain Assessment: No/denies pain Locomotion : Wheelchair Mobility Distance: 110   See FIM for current functional status  Therapy/Group: Individual Therapy  Anahla Bevis 06/14/2014, 10:55 AM

## 2014-06-14 NOTE — Progress Notes (Signed)
Occupational Therapy Session Note  Patient Details  Name: Jeffrey [name] MRN: [id number] Date of Birth: September 13, 1940  Today's Date: 06/14/2014 OT Individual Time:  -    1645-1730  (45 min)      Short Term Goals: Week 1:  OT Short Term Goal 1 (Week 1): Pt will peform UB bathing with supervision sitting unsupported. OT Short Term Goal 2 (Week 1): Pt will complete LB bathing sit to stand with mod assist for 2 consecutive sessions. OT Short Term Goal 3 (Week 1): Pt will perform LB dressing with mod assist 2 consecutive sessions. OT Short Term Goal 4 (Week 1): Pt will demonstrate increased awareness of the RUE by placing it in his lap prior to transfers, and LB selfcare with no more than mod instructional cueing.  OT Short Term Goal 5 (Week 1): Pt will perform toilet transfer with max assist stand pivot.  :     Skilled Therapeutic Interventions/Progress Updates:      Pt. Sitting in wc with wife present.  Performed RUE coordination, weight bearing activities, sit to stand, standing balance.  Practiced hand opening and closing with finger to thumb.  Practiced sit to stand with max assist to maintain weight through RUE.  Standing x4 with min to max assit with cues to stabilize through hips.  Pt stated he felt like the chair was pushing him backward;  when in actually the chaiir was not touching him.  BP= 130/75 after sit to stand;  O2=94 % on 2 liters of oxygen.   Pt. Left in wc with dinner tray.  Assisted pt with set up for meal.  Left him with all needs in reach and wife present.  .    Therapy Documentation Precautions:  Precautions Precautions: Fall Precaution Comments: due to right hemiplegia, diplopia Restrictions Weight Bearing Restrictions: No General:   Vital Signs: Therapy Vitals Temp: 97.7 F (36.5 C) Temp Source: Oral Pulse Rate: 88 Resp: 20 BP: 95/77 mmHg Patient Position (if appropriate): Sitting Oxygen Therapy SpO2: 99 % O2 Device: Not Delivered O2 Flow Rate (L/min): 2  L/min Pain: Pain Assessment Pain Assessment: No/denies pain          See FIM for current functional status  Therapy/Group: Individual Therapy  Lisa Roca 06/14/2014, 5:35 PM

## 2014-06-14 NOTE — Progress Notes (Signed)
Subjective/Complaints: No issues overnite Working with SLP on comprehension test  Objective: Vital Signs: Blood pressure 138/69, pulse 92, temperature 98.8 F (37.1 C), temperature source Oral, resp. rate 17, height 5' 8"  (1.727 m), weight 81.9 kg (180 lb 8.9 oz), SpO2 94 %. No results found. Results for orders placed or performed during the hospital encounter of 06/11/14 (from the past 72 hour(s))  Glucose, capillary     Status: Abnormal   Collection Time: 06/11/14  9:30 PM  Result Value Ref Range   Glucose-Capillary 173 (H) 70 - 99 mg/dL  CBC WITH DIFFERENTIAL     Status: Abnormal   Collection Time: 06/12/14  6:31 AM  Result Value Ref Range   WBC 8.3 4.0 - 10.5 K/uL   RBC 4.61 4.22 - 5.81 MIL/uL   Hemoglobin 14.1 13.0 - 17.0 g/dL   HCT 40.8 39.0 - 52.0 %   MCV 88.5 78.0 - 100.0 fL   MCH 30.6 26.0 - 34.0 pg   MCHC 34.6 30.0 - 36.0 g/dL   RDW 13.3 11.5 - 15.5 %   Platelets 199 150 - 400 K/uL   Neutrophils Relative % 82 (H) 43 - 77 %   Neutro Abs 6.8 1.7 - 7.7 K/uL   Lymphocytes Relative 8 (L) 12 - 46 %   Lymphs Abs 0.7 0.7 - 4.0 K/uL   Monocytes Relative 8 3 - 12 %   Monocytes Absolute 0.7 0.1 - 1.0 K/uL   Eosinophils Relative 1 0 - 5 %   Eosinophils Absolute 0.1 0.0 - 0.7 K/uL   Basophils Relative 1 0 - 1 %   Basophils Absolute 0.0 0.0 - 0.1 K/uL  Comprehensive metabolic panel     Status: Abnormal   Collection Time: 06/12/14  6:31 AM  Result Value Ref Range   Sodium 136 135 - 145 mmol/L   Potassium 3.7 3.5 - 5.1 mmol/L   Chloride 103 96 - 112 mmol/L   CO2 23 19 - 32 mmol/L   Glucose, Bld 166 (H) 70 - 99 mg/dL   BUN 11 6 - 23 mg/dL   Creatinine, Ser 0.81 0.50 - 1.35 mg/dL   Calcium 8.4 8.4 - 10.5 mg/dL   Total Protein 5.4 (L) 6.0 - 8.3 g/dL   Albumin 2.5 (L) 3.5 - 5.2 g/dL   AST 25 0 - 37 U/L   ALT 29 0 - 53 U/L   Alkaline Phosphatase 105 39 - 117 U/L   Total Bilirubin 1.2 0.3 - 1.2 mg/dL   GFR calc non Af Amer 87 (L) >90 mL/min   GFR calc Af Amer >90 >90  mL/min    Comment: (NOTE) The eGFR has been calculated using the CKD EPI equation. This calculation has not been validated in all clinical situations. eGFR's persistently <90 mL/min signify possible Chronic Kidney Disease.    Anion gap 10 5 - 15  Glucose, capillary     Status: Abnormal   Collection Time: 06/12/14  6:43 AM  Result Value Ref Range   Glucose-Capillary 155 (H) 70 - 99 mg/dL  Glucose, capillary     Status: Abnormal   Collection Time: 06/12/14 11:47 AM  Result Value Ref Range   Glucose-Capillary 147 (H) 70 - 99 mg/dL   Comment 1 Notify RN   Glucose, capillary     Status: Abnormal   Collection Time: 06/12/14  4:53 PM  Result Value Ref Range   Glucose-Capillary 156 (H) 70 - 99 mg/dL   Comment 1 Notify RN   Glucose,  capillary     Status: Abnormal   Collection Time: 06/12/14  9:17 PM  Result Value Ref Range   Glucose-Capillary 190 (H) 70 - 99 mg/dL  Glucose, capillary     Status: Abnormal   Collection Time: 06/13/14  6:41 AM  Result Value Ref Range   Glucose-Capillary 168 (H) 70 - 99 mg/dL  Glucose, capillary     Status: Abnormal   Collection Time: 06/13/14 11:31 AM  Result Value Ref Range   Glucose-Capillary 233 (H) 70 - 99 mg/dL   Comment 1 Notify RN   Glucose, capillary     Status: Abnormal   Collection Time: 06/13/14  4:49 PM  Result Value Ref Range   Glucose-Capillary 188 (H) 70 - 99 mg/dL   Comment 1 Notify RN   Glucose, capillary     Status: Abnormal   Collection Time: 06/13/14 11:09 PM  Result Value Ref Range   Glucose-Capillary 172 (H) 70 - 99 mg/dL   Comment 1 Notify RN   Glucose, capillary     Status: Abnormal   Collection Time: 06/14/14  6:30 AM  Result Value Ref Range   Glucose-Capillary 192 (H) 70 - 99 mg/dL   Comment 1 Notify RN      HEENT: tape over lens  of eyeglass on RIght Cardio: RRR Resp: CTA B/L and unlabored GI: BS positive and NT, ND Extremity:  Pulses positive and No Edema Skin:   Intact Neuro: Alert/Oriented, Abnormal Sensory  reduced sensation RUE adn RLE, Abnormal Motor 2- R delt , bi, tri, grip, HF, KN, ankle DF is trace and Other ataxia RUE Musc/Skel:  Edema RLE pretib, no erythema no tenderness , no pain with R Ankle ROM Gen NAD  Assessment/Plan: 1. Functional deficits secondary to Left thalamic ICH which require 3+ hours per day of interdisciplinary therapy in a comprehensive inpatient rehab setting. Physiatrist is providing close team supervision and 24 hour management of active medical problems listed below. Physiatrist and rehab team continue to assess barriers to discharge/monitor patient progress toward functional and medical goals. FIM: FIM - Bathing Bathing Steps Patient Completed: Chest, Abdomen, Right upper leg, Left upper leg, Right Arm, Left Arm, Left lower leg (including foot) Bathing: 3: Mod-Patient completes 5-7 61f10 parts or 50-74%  FIM - Upper Body Dressing/Undressing Upper body dressing/undressing steps patient completed: Thread/unthread left sleeve of pullover shirt/dress, Put head through opening of pull over shirt/dress Upper body dressing/undressing: 3: Mod-Patient completed 50-74% of tasks FIM - Lower Body Dressing/Undressing Lower body dressing/undressing: 1: Total-Patient completed less than 25% of tasks        FIM - BControl and instrumentation engineerDevices: Arm rests Bed/Chair Transfer: 1: Two helpers  FIM - Locomotion: Wheelchair Distance: 40 Locomotion: Wheelchair: 1: Travels less than 50 ft with moderate assistance (Pt: 50 - 74%) (L UE/LE) FIM - Locomotion: Ambulation Locomotion: Ambulation Assistive Devices: Other (comment) (+2 '3 musketeers') Ambulation/Gait Assistance: 1: +2 Total assist Locomotion: Ambulation: 0: Activity did not occur  Comprehension Comprehension Mode: Auditory Comprehension: 5-Understands basic 90% of the time/requires cueing < 10% of the time  Expression Expression Mode: Verbal Expression: 5-Expresses basic 90% of the  time/requires cueing < 10% of the time.  Social Interaction Social Interaction: 5-Interacts appropriately 90% of the time - Needs monitoring or encouragement for participation or interaction.  Problem Solving Problem Solving: 4-Solves basic 75 - 89% of the time/requires cueing 10 - 24% of the time  Memory Memory: 3-Recognizes or recalls 50 - 74% of the time/requires cueing  25 - 49% of the time  Medical Problem List and Plan: 1. Functional deficits secondary to Left Thalamic ICH 2. DVT Prophylaxis/Anticoagulation: SCDs.Monitor for signs of DVT 3. Pain Management: Tylenol as needed 4. HCAP. Encourage incentive spirometer. Vancomycin and Zosyn discontinued and changed to Levaquin 06/11/2014. Check oxygen saturations every shift 5. Neuropsych: This patient is capable of making decisions on his own behalf. 6. Skin/Wound Care: Routine skin checks 7. Fluids/Electrolytes/Nutrition: Strict I and O's with follow-up chemistries 8. Mild ileus. Diet has been advanced to regular. No nausea vomiting. Establish bowel program 9. Diabetes mellitus and peripheral neuropathy. Latest hemoglobin A1c 7.8. Patient with history of insulin pump followed by Dr. Buddy Duty and endocrine services. Current sliding scale insulin 10. Coronary artery disease with balloon angioplasty. No chest pain or shortness of breath. 11. Urinary retention. Continue Flomax. Foley tube remain in place. Follow-up urology services outpatient 12.  Peripheral edema related to weakness RLE, TED hose LOS (Days) 3 A FACE TO FACE EVALUATION WAS PERFORMED  Jeffrey [name] 06/14/2014, 9:09 AM

## 2014-06-15 ENCOUNTER — Inpatient Hospital Stay (HOSPITAL_COMMUNITY): Payer: Medicare Other | Admitting: Physical Therapy

## 2014-06-15 DIAGNOSIS — J189 Pneumonia, unspecified organism: Secondary | ICD-10-CM

## 2014-06-15 LAB — GLUCOSE, CAPILLARY
GLUCOSE-CAPILLARY: 238 mg/dL — AB (ref 70–99)
Glucose-Capillary: 186 mg/dL — ABNORMAL HIGH (ref 70–99)
Glucose-Capillary: 246 mg/dL — ABNORMAL HIGH (ref 70–99)
Glucose-Capillary: 223 mg/dL — ABNORMAL HIGH (ref 70–99)

## 2014-06-15 NOTE — Progress Notes (Signed)
Subjective/Complaints: Breathing well, no new issues Working with SLP on comprehension test  Objective: Vital Signs: Blood pressure 149/67, pulse 87, temperature 98.7 F (37.1 C), temperature source Oral, resp. rate 18, height 5\' 8"  (1.727 m), weight 82.2 kg (181 lb 3.5 oz), SpO2 94 %. No results found. Results for orders placed or performed during the hospital encounter of 06/11/14 (from the past 72 hour(s))  Glucose, capillary     Status: Abnormal   Collection Time: 06/12/14 11:47 AM  Result Value Ref Range   Glucose-Capillary 147 (H) 70 - 99 mg/dL   Comment 1 Notify RN   Glucose, capillary     Status: Abnormal   Collection Time: 06/12/14  4:53 PM  Result Value Ref Range   Glucose-Capillary 156 (H) 70 - 99 mg/dL   Comment 1 Notify RN   Glucose, capillary     Status: Abnormal   Collection Time: 06/12/14  9:17 PM  Result Value Ref Range   Glucose-Capillary 190 (H) 70 - 99 mg/dL  Glucose, capillary     Status: Abnormal   Collection Time: 06/13/14  6:41 AM  Result Value Ref Range   Glucose-Capillary 168 (H) 70 - 99 mg/dL  Glucose, capillary     Status: Abnormal   Collection Time: 06/13/14 11:31 AM  Result Value Ref Range   Glucose-Capillary 233 (H) 70 - 99 mg/dL   Comment 1 Notify RN   Glucose, capillary     Status: Abnormal   Collection Time: 06/13/14  4:49 PM  Result Value Ref Range   Glucose-Capillary 188 (H) 70 - 99 mg/dL   Comment 1 Notify RN   Glucose, capillary     Status: Abnormal   Collection Time: 06/13/14 11:09 PM  Result Value Ref Range   Glucose-Capillary 172 (H) 70 - 99 mg/dL   Comment 1 Notify RN   Glucose, capillary     Status: Abnormal   Collection Time: 06/14/14  6:30 AM  Result Value Ref Range   Glucose-Capillary 192 (H) 70 - 99 mg/dL   Comment 1 Notify RN   Glucose, capillary     Status: Abnormal   Collection Time: 06/14/14 11:46 AM  Result Value Ref Range   Glucose-Capillary 197 (H) 70 - 99 mg/dL   Comment 1 Notify RN   Glucose, capillary      Status: Abnormal   Collection Time: 06/14/14  4:31 PM  Result Value Ref Range   Glucose-Capillary 204 (H) 70 - 99 mg/dL   Comment 1 Notify RN   Glucose, capillary     Status: Abnormal   Collection Time: 06/14/14  9:58 PM  Result Value Ref Range   Glucose-Capillary 205 (H) 70 - 99 mg/dL  Glucose, capillary     Status: Abnormal   Collection Time: 06/15/14  7:00 AM  Result Value Ref Range   Glucose-Capillary 186 (H) 70 - 99 mg/dL     HEENT: tape over lens  of eyeglass on RIght Cardio: RRR Resp: CTA B/L and unlabored GI: BS positive and NT, ND Extremity:  Pulses positive and No Edema Skin:   Intact Neuro: Alert/Oriented, Abnormal Sensory reduced sensation RUE adn RLE, Abnormal Motor 2- R delt , bi, tri, grip, HF, KN, ankle DF is trace and Other ataxia RUE Musc/Skel:  Edema RLE pretib, no erythema no tenderness , no pain with R Ankle ROM Gen NAD  Assessment/Plan: 1. Functional deficits secondary to Left thalamic ICH which require 3+ hours per day of interdisciplinary therapy in a comprehensive inpatient rehab  setting. Physiatrist is providing close team supervision and 24 hour management of active medical problems listed below. Physiatrist and rehab team continue to assess barriers to discharge/monitor patient progress toward functional and medical goals. FIM: FIM - Bathing Bathing Steps Patient Completed: Chest, Abdomen, Right upper leg, Left upper leg, Right Arm, Left Arm, Left lower leg (including foot) Bathing: 3: Mod-Patient completes 5-7 56f 10 parts or 50-74%  FIM - Upper Body Dressing/Undressing Upper body dressing/undressing steps patient completed: Thread/unthread left sleeve of pullover shirt/dress, Put head through opening of pull over shirt/dress Upper body dressing/undressing: 3: Mod-Patient completed 50-74% of tasks FIM - Lower Body Dressing/Undressing Lower body dressing/undressing: 1: Total-Patient completed less than 25% of tasks        FIM - Financial trader Devices: Arm rests Bed/Chair Transfer: 1: Two helpers  FIM - Locomotion: Wheelchair Distance: 130 Locomotion: Wheelchair: 2: Travels 50 - 149 ft with minimal assistance (Pt.>75%) FIM - Locomotion: Ambulation Locomotion: Ambulation Assistive Devices: Other (comment) (+2 '3 musketeers') Ambulation/Gait Assistance: 1: +2 Total assist Locomotion: Ambulation: 0: Activity did not occur  Comprehension Comprehension Mode: Auditory Comprehension: 5-Understands basic 90% of the time/requires cueing < 10% of the time  Expression Expression Mode: Verbal Expression: 5-Expresses basic 90% of the time/requires cueing < 10% of the time.  Social Interaction Social Interaction: 5-Interacts appropriately 90% of the time - Needs monitoring or encouragement for participation or interaction.  Problem Solving Problem Solving: 4-Solves basic 75 - 89% of the time/requires cueing 10 - 24% of the time  Memory Memory: 3-Recognizes or recalls 50 - 74% of the time/requires cueing 25 - 49% of the time  Medical Problem List and Plan: 1. Functional deficits secondary to Left Thalamic ICH 2. DVT Prophylaxis/Anticoagulation: SCDs.Monitor for signs of DVT 3. Pain Management: Tylenol as needed 4. HCAP. Encourage incentive spirometer. Vancomycin and Zosyn discontinued and changed to Levaquin 06/11/2014. Check oxygen saturations every shift, starting to wean O2 5. Neuropsych: This patient is capable of making decisions on his own behalf. 6. Skin/Wound Care: Routine skin checks 7. Fluids/Electrolytes/Nutrition: Strict I and O's with follow-up chemistries 8. Mild ileus. Diet has been advanced to regular. No nausea vomiting. Establish bowel program 9. Diabetes mellitus and peripheral neuropathy. Latest hemoglobin A1c 7.8. Patient with history of insulin pump followed by Dr. Buddy Duty and endocrine services. Current sliding scale insulin 10. Coronary artery disease with balloon  angioplasty. No chest pain or shortness of breath. 11. Urinary retention. Continue Flomax. Foley tube remain in place. Follow-up urology services outpatient 12.  Peripheral edema related to weakness RLE, TED hose LOS (Days) 4 A FACE TO FACE EVALUATION WAS PERFORMED  KIRSTEINS,ANDREW E 06/15/2014, 9:10 AM

## 2014-06-15 NOTE — Progress Notes (Signed)
Physical Therapy Session Note  Patient Details  Name: Jeffrey [name] MRN: [id number] Date of Birth: [birth year]  Today's Date: 06/15/2014 PT Individual Time: 0900-1000 PT Individual Time Calculation (min): 60 min   Short Term Goals: Week 1:  PT Short Term Goal 1 (Week 1): Pt will perform supine<>sit with mod A and 50% cueing with HOB elevated and rails.  PT Short Term Goal 2 (Week 1): Pt will perform bed<> w/c transfer with total A +1 and 75% cueing. PT Short Term Goal 3 (Week 1): Pt will ambulate of 20' with +2 A and 100% cueing.  PT Short Term Goal 4 (Week 1): Pt will propel w/c 50' in controlled environment with mod A and 50% cues.  PT Short Term Goal 5 (Week 1): Pt will perform dynamic sitting balance without UE support for >5 mins with supervision and 25% cueing.    Skilled Therapeutic Interventions/Progress Updates:   Pt demonstrates improvement in session requiring decreased assist for transfers. Pt also able to initiate gait without AD and 1 person assist thought limited to short distance by knee instability. Pt requires frequent rests throughout session to tolerate activity.  Pt would continue to benefit from skilled individualized PT services to increase functional mobility.  Therapy Documentation Precautions:  Precautions Precautions: Fall Precaution Comments: due to right hemiplegia, diplopia Restrictions Weight Bearing Restrictions: No Vital Signs:  Pulse 103 SpO2 93% with rest, Pulse 105 SpO2 96% RA  Pain: Pain Assessment Pain Assessment: No/denies pain Mobility:  Max A for transfers with cues for weight shift, sequencing and technique Locomotion : Ambulation Ambulation/Gait Assistance: 1: +2 Total assist (Max A of 1 for 5's with w/c follow) , no AD Other Treatments:  Pt performs static and dynamic supported sitting dual motor tasks including weight shifting, reaching, and LE manipulation within functional task. Pt performs unsupported sitting at mat with RUE forced  use static as active rest, anterior weight shifting 2x10. Pt performs transfer training 2x5. BLE pre gait 2x10. Scap mobs in all planes followed by Mackie Pai   See FIM for current functional status  Therapy/Group: Individual Therapy  Monia Pouch 06/15/2014, 9:53 AM

## 2014-06-16 ENCOUNTER — Inpatient Hospital Stay (HOSPITAL_COMMUNITY): Payer: Medicare Other | Admitting: Occupational Therapy

## 2014-06-16 ENCOUNTER — Inpatient Hospital Stay (HOSPITAL_COMMUNITY): Payer: Medicare Other | Admitting: Physical Therapy

## 2014-06-16 ENCOUNTER — Inpatient Hospital Stay (HOSPITAL_COMMUNITY): Payer: Medicare Other | Admitting: Speech Pathology

## 2014-06-16 LAB — GLUCOSE, CAPILLARY
GLUCOSE-CAPILLARY: 261 mg/dL — AB (ref 70–99)
GLUCOSE-CAPILLARY: 199 mg/dL — AB (ref 70–99)
Glucose-Capillary: 183 mg/dL — ABNORMAL HIGH (ref 70–99)
Glucose-Capillary: 198 mg/dL — ABNORMAL HIGH (ref 70–99)

## 2014-06-16 NOTE — Progress Notes (Signed)
Speech Language Pathology Daily Session Note  Patient Details  Name: Jeffrey [name] MRN: [id number] Date of Birth: Apr 06, 1940  Today's Date: 06/16/2014 SLP Individual Time: 1000-1105 SLP Individual Time Calculation (min): 65 min  Short Term Goals: Week 1: SLP Short Term Goal 1 (Week 1): Pt willl utilize compensatory aids to facilitate recall of new information during basic, familiar tasks with min assist verbal cues  SLP Short Term Goal 2 (Week 1): Pt will recognize and correct errors in the moment when completing basic, familiar home management tasks with min assist verbal cues.   SLP Short Term Goal 3 (Week 1): Pt will return demonstration of 2 safety precautions during structured and unstructured therapeutic tasks with min assist verbal cues.    Skilled Therapeutic Interventions:  Pt was seen for skilled ST targeting cognitive goals.  Upon arrival, pt was seated upright in wheelchair, awake, alert, and agreeable to participate in [place].  SLP facilitated the session with a structured grocery planning task targeting planning, organization, and error awareness.  Pt sorted a list of 14 items into 4 categories based on similarities/location in the grocery store (i.e. Produce, dairy, meat, general) with supervision.  Pt then required min-mod assist verbal cues to locate the abovementioned items from a grocery store flyer.  SLP noted pt with increased difficulty locating items on the right side of the flyer versus to the left of midline.  Task challenge was also increased with forced use of right upper extremity to facilitate improved attention the right with heavier cuing needed to locate items which SLP suspects to be related to difficulty when dual tasking.  Pt completed functional math calculations to generate a total of the targeted items with min assist verbal cues for error awareness.  Pt left upright in wheelchair, all needs left within reach.  Continue per current plan of care.    FIM:   Comprehension Comprehension Mode: Auditory Comprehension: 5-Follows basic conversation/direction: With extra time/assistive device Expression Expression Mode: Verbal Expression: 5-Expresses basic 90% of the time/requires cueing < 10% of the time. Social Interaction Social Interaction: 5-Interacts appropriately 90% of the time - Needs monitoring or encouragement for participation or interaction. Problem Solving Problem Solving: 4-Solves basic 75 - 89% of the time/requires cueing 10 - 24% of the time Memory Memory: 4-Recognizes or recalls 75 - 89% of the time/requires cueing 10 - 24% of the time  Pain Pain Assessment Pain Assessment: No/denies pain  Therapy/Group: Individual Therapy  Arthor Gorter, Selinda Orion 06/16/2014, 3:29 PM

## 2014-06-16 NOTE — Progress Notes (Signed)
Occupational Therapy Session Note  Patient Details  Name: Jeffrey [name] MRN: [id number] Date of Birth: Jun 16, 1940  Today's Date: 06/16/2014 OT Individual Time: 0800-0900 OT Individual Time Calculation (min): 60 min    Short Term Goals: Week 1:  OT Short Term Goal 1 (Week 1): Pt will peform UB bathing with supervision sitting unsupported. OT Short Term Goal 2 (Week 1): Pt will complete LB bathing sit to stand with mod assist for 2 consecutive sessions. OT Short Term Goal 3 (Week 1): Pt will perform LB dressing with mod assist 2 consecutive sessions. OT Short Term Goal 4 (Week 1): Pt will demonstrate increased awareness of the RUE by placing it in his lap prior to transfers, and LB selfcare with no more than mod instructional cueing.  OT Short Term Goal 5 (Week 1): Pt will perform toilet transfer with max assist stand pivot.   Skilled Therapeutic Interventions/Progress Updates:    Pt transferred supine to sit EOB with mod assist.  Mod assist with max instructional cueing to perform squat pivot transfer from bed to to wheelchair.  He continues to leave his RUE hanging off of the side of the wheelchair and bed without any awareness that he is doing so.  Max instructional cueing to position the RUE after this has occurred.  Mod assist for sit to stand and standing balance when performing peri care with max assist for standing to pull pants over hips.  He was able to sequence through bathing with min questioning cues but requires mod facilitation to integrate the RUE into bathing of the LLE and LUE.  Max assist for stand pivot transfer to 3:1 with pt performing hygiene in sitting with min guard assist.  Max assist for transfer back to the wheelchair as well, stand pivot.  Assisted pt with LB dressing secondary to decreased time but he was able to donn pullover shirt following hemi techniques with min assist.  Oxygen sats on 2 1/2 L nasal cannula at 94%.    Therapy Documentation Precautions:   Precautions Precautions: Fall Precaution Comments: due to right hemiplegia, diplopia Restrictions Weight Bearing Restrictions: No  Pain: Pain Assessment Pain Assessment: No/denies pain Pain Score: 0-No pain ADL: See FIM for current functional status  Therapy/Group: Individual Therapy  Waylyn Tenbrink OTR/L 06/16/2014, 12:13 PM

## 2014-06-16 NOTE — Progress Notes (Signed)
Physical Therapy Session Note  Patient Details  Name: Jeffrey [name] MRN: [id number] Date of Birth: 03/19/1940  Today's Date: 06/16/2014 PT Individual Time: 1300-1400 PT Individual Time Calculation (min): 60 min   Short Term Goals: Week 1:  PT Short Term Goal 1 (Week 1): Pt will perform supine<>sit with mod A and 50% cueing with HOB elevated and rails.  PT Short Term Goal 2 (Week 1): Pt will perform bed<> w/c transfer with total A +1 and 75% cueing. PT Short Term Goal 3 (Week 1): Pt will ambulate of 20' with +2 A and 100% cueing.  PT Short Term Goal 4 (Week 1): Pt will propel w/c 50' in controlled environment with mod A and 50% cues.  PT Short Term Goal 5 (Week 1): Pt will perform dynamic sitting balance without UE support for >5 mins with supervision and 25% cueing.    Skilled Therapeutic Interventions/Progress Updates:  Pt received in w/c with family present; agreeable to therapy.  Session focused on w/c mobility, dynamic sitting/standing balance, and pre-gait. Pt propelled w/c using L hemi techinque x130' in controlled and home environments min A into day room with verbal and tactile cues on L LE for forward propulsion.  Pt performed sit<>stand, gait x5' feet, then negotiated small threshold onto to standing scale +2 total ('3 musketeers') per nurse's request to record weight.  Pt required +2 total A for standing balance on scale. Multimodal cueing provided during gait for sequencing, weight shifting; manually advancing R LE forward (pt able to advance R LE backwards without manual assist), and approximation at R knee to avoid buckling.   Pt was transported in w/c total A to the gym due to energy conservation and time constraints, transferring w/c<>mat +2A (hand over back technique).  Pt participated in dynamic sitting balance activity that emphasized lateral weight shifting across midline to bear weight on R UE while grabbing rings.  Pt was transported total A in w/c back to room hallway, then  propelled w/c with B LEs 40' mod A for LE strengthening and attention to R LE. Verbal and manual cues provided to advance R LE.  Since pt's SpO2 remained >95% on 3L supplemental O2 ([place]) in the beginning of the treatment, supplemental O2 decreased to 2L at rest with SpO2 at 95%.  Pt left in w/c in room on 2L supplemental O2 ([place]) with family present and all needs within reach.   Therapy Documentation Precautions:  Precautions Precautions: Fall Precaution Comments: due to right hemiplegia, diplopia Restrictions Weight Bearing Restrictions: No Vital Signs: Therapy Vitals Temp: 98.5 F (36.9 C) Temp Source: Oral Pulse Rate: 87 Resp: 18 BP: 131/61 mmHg Patient Position (if appropriate): Sitting Oxygen Therapy SpO2: 96 % O2 Device: Nasal Cannula O2 Flow Rate (L/min): 2 L/min Pain: Pain Assessment Pain Assessment: No/denies pain Locomotion : Ambulation Ambulation/Gait Assistance: 1: +2 Total assist Wheelchair Mobility Distance: 130   See FIM for current functional status  Therapy/Group: Individual Therapy  Lindalee Huizinga 06/16/2014, 3:45 PM

## 2014-06-16 NOTE — Progress Notes (Signed)
Occupational Therapy Session Note  Patient Details  Name: Jeffrey [name] MRN: [id number] Date of Birth: 03/24/1940  Today's Date: 06/16/2014 OT Individual Time: 6503-5465 OT Individual Time Calculation (min): 31 min     Skilled Therapeutic Interventions/Progress Updates:    Pt worked on Brewing technologist for the RUE during session.  Began with weightbearing over the RUE with emphasis on maintaining sustained visual attention to the arm with elbow extension.  Had pt work on transitional reaching tasks with the LUE while maintaining elbow extension on the right.  Pt with decreased ability to motor plan active elbow extension to help return back to midline, thus requiring mod assist.  Pt's O2 sats monitored during session as well with O2 92-94% on room air.  Second part of session focused on active reaching with the RUE to pick up cups from bedside table and re-stack them.  Pt with ability to open and grasp cups but needed mod support at the shoulder and to facilitate elbow extension for reaching.    Therapy Documentation Precautions:  Precautions Precautions: Fall Precaution Comments: right hemiparesis, diplopia, decreased proprioception throughout the RUE and RLE Restrictions Weight Bearing Restrictions: No  Pain: Pain Assessment Pain Assessment: No/denies pain ADL: See FIM for current functional status  Therapy/Group: Individual Therapy  Uno Esau OTR/L 06/16/2014, 4:25 PM

## 2014-06-16 NOTE — Progress Notes (Signed)
Per previous report O2 off most of day with sats in mid 90's. At 2030, O2 sat-82% RA. O2 placed at 3L/m via [place] with sats at 94%. Patient without complaint of. BLE pitting edema, right>left. Right leg with increased edema. Dry cough with some improvement per patient. Patrici Ranks A

## 2014-06-16 NOTE — Progress Notes (Signed)
Subjective/Complaints: No pain c/o , coughed a lot on Friday but minimal today, O2 sat 94% on 2.5L during ADLs OT notes absent proprioception ROS- neg Objective: Vital Signs: Blood pressure 138/83, pulse 86, temperature 98.8 F (37.1 C), temperature source Oral, resp. rate 18, height 5\' 8"  (1.727 m), weight 82.2 kg (181 lb 3.5 oz), SpO2 89 %. No results found. Results for orders placed or performed during the hospital encounter of 06/11/14 (from the past 72 hour(s))  Glucose, capillary     Status: Abnormal   Collection Time: 06/13/14 11:31 AM  Result Value Ref Range   Glucose-Capillary 233 (H) 70 - 99 mg/dL   Comment 1 Notify RN   Glucose, capillary     Status: Abnormal   Collection Time: 06/13/14  4:49 PM  Result Value Ref Range   Glucose-Capillary 188 (H) 70 - 99 mg/dL   Comment 1 Notify RN   Glucose, capillary     Status: Abnormal   Collection Time: 06/13/14 11:09 PM  Result Value Ref Range   Glucose-Capillary 172 (H) 70 - 99 mg/dL   Comment 1 Notify RN   Glucose, capillary     Status: Abnormal   Collection Time: 06/14/14  6:30 AM  Result Value Ref Range   Glucose-Capillary 192 (H) 70 - 99 mg/dL   Comment 1 Notify RN   Glucose, capillary     Status: Abnormal   Collection Time: 06/14/14 11:46 AM  Result Value Ref Range   Glucose-Capillary 197 (H) 70 - 99 mg/dL   Comment 1 Notify RN   Glucose, capillary     Status: Abnormal   Collection Time: 06/14/14  4:31 PM  Result Value Ref Range   Glucose-Capillary 204 (H) 70 - 99 mg/dL   Comment 1 Notify RN   Glucose, capillary     Status: Abnormal   Collection Time: 06/14/14  9:58 PM  Result Value Ref Range   Glucose-Capillary 205 (H) 70 - 99 mg/dL  Glucose, capillary     Status: Abnormal   Collection Time: 06/15/14  7:00 AM  Result Value Ref Range   Glucose-Capillary 186 (H) 70 - 99 mg/dL  Glucose, capillary     Status: Abnormal   Collection Time: 06/15/14 11:14 AM  Result Value Ref Range   Glucose-Capillary 246 (H) 70  - 99 mg/dL   Comment 1 Notify RN   Glucose, capillary     Status: Abnormal   Collection Time: 06/15/14  4:37 PM  Result Value Ref Range   Glucose-Capillary 238 (H) 70 - 99 mg/dL   Comment 1 Notify RN   Glucose, capillary     Status: Abnormal   Collection Time: 06/15/14  8:53 PM  Result Value Ref Range   Glucose-Capillary 223 (H) 70 - 99 mg/dL  Glucose, capillary     Status: Abnormal   Collection Time: 06/16/14  6:31 AM  Result Value Ref Range   Glucose-Capillary 183 (H) 70 - 99 mg/dL     HEENT: tape over lens  of eyeglass on RIght Cardio: RRR Resp: CTA B/L and unlabored GI: BS positive and NT, ND Extremity:  Pulses positive and No Edema Skin:   Intact Neuro: Alert/Oriented, Abnormal Sensory reduced sensation RUE and RLE, Abnormal Motor 2- R delt , bi, tri, grip, HF, KN, ankle DF is trace and Other ataxia RUE Musc/Skel:  Edema RLE pretib, no erythema no tenderness , no pain with R Ankle ROM Gen NAD  Assessment/Plan: 1. Functional deficits secondary to Left thalamic ICH which  require 3+ hours per day of interdisciplinary therapy in a comprehensive inpatient rehab setting. Physiatrist is providing close team supervision and 24 hour management of active medical problems listed below. Physiatrist and rehab team continue to assess barriers to discharge/monitor patient progress toward functional and medical goals. FIM: FIM - Bathing Bathing Steps Patient Completed: Chest, Abdomen, Right upper leg, Left upper leg, Right Arm, Left Arm, Left lower leg (including foot) Bathing: 3: Mod-Patient completes 5-7 24f 10 parts or 50-74%  FIM - Upper Body Dressing/Undressing Upper body dressing/undressing steps patient completed: Thread/unthread left sleeve of pullover shirt/dress, Put head through opening of pull over shirt/dress Upper body dressing/undressing: 3: Mod-Patient completed 50-74% of tasks FIM - Lower Body Dressing/Undressing Lower body dressing/undressing: 1: Total-Patient completed  less than 25% of tasks  FIM - Toileting Toileting: 1: Total-Patient completed zero steps, helper did all 3  FIM - Radio producer Devices: Bedside commode Toilet Transfers: 2-To toilet/BSC: Max A (lift and lower assist), 2-From toilet/BSC: Max A (lift and lower assist)  FIM - Control and instrumentation engineer Devices: Arm rests Bed/Chair Transfer: 2: Bed > Chair or W/C: Max A (lift and lower assist), 2: Chair or W/C > Bed: Max A (lift and lower assist)  FIM - Locomotion: Wheelchair Distance: 130 Locomotion: Wheelchair: 0: Activity did not occur FIM - Locomotion: Ambulation Locomotion: Ambulation Assistive Devices: Other (comment) (+2 '3 musketeers') Ambulation/Gait Assistance: 1: +2 Total assist (Max A of 1 for 5's with w/c follow) Locomotion: Ambulation: 1: Two helpers  Comprehension Comprehension Mode: Auditory Comprehension: 5-Understands basic 90% of the time/requires cueing < 10% of the time  Expression Expression Mode: Verbal Expression: 5-Expresses basic 90% of the time/requires cueing < 10% of the time.  Social Interaction Social Interaction: 5-Interacts appropriately 90% of the time - Needs monitoring or encouragement for participation or interaction.  Problem Solving Problem Solving: 4-Solves basic 75 - 89% of the time/requires cueing 10 - 24% of the time  Memory Memory: 3-Recognizes or recalls 50 - 74% of the time/requires cueing 25 - 49% of the time  Medical Problem List and Plan: 1. Functional deficits secondary to Left Thalamic ICH 2. DVT Prophylaxis/Anticoagulation: SCDs.Monitor for signs of DVT 3. Pain Management: Tylenol as needed 4. HCAP. Encourage incentive spirometer. Vancomycin and Zosyn discontinued and changed to Levaquin 06/11/2014. Check oxygen saturations every shift, starting to wean O2 5. Neuropsych: This patient is capable of making decisions on his own behalf. 6. Skin/Wound Care: Routine skin checks 7.  Fluids/Electrolytes/Nutrition: Strict I and O's with follow-up chemistries 8. Mild ileus. Diet has been advanced to regular. No nausea vomiting. Establish bowel program 9. Diabetes mellitus and peripheral neuropathy. Latest hemoglobin A1c 7.8. Patient with history of insulin pump followed by Dr. Buddy Duty and endocrine services. Current sliding scale insulin 10. Coronary artery disease with balloon angioplasty. No chest pain or shortness of breath. 11. Urinary retention. Continue Flomax. Foley tube remain in place. Follow-up urology services outpatient 12.  Peripheral edema related to weakness RLE, TED hose LOS (Days) 5 A FACE TO FACE EVALUATION WAS PERFORMED  KIRSTEINS,ANDREW E 06/16/2014, 8:15 AM

## 2014-06-17 ENCOUNTER — Inpatient Hospital Stay (HOSPITAL_COMMUNITY): Payer: Medicare Other | Admitting: Speech Pathology

## 2014-06-17 ENCOUNTER — Inpatient Hospital Stay (HOSPITAL_COMMUNITY): Payer: Medicare Other | Admitting: Physical Therapy

## 2014-06-17 ENCOUNTER — Inpatient Hospital Stay (HOSPITAL_COMMUNITY): Payer: Medicare Other | Admitting: *Deleted

## 2014-06-17 ENCOUNTER — Inpatient Hospital Stay (HOSPITAL_COMMUNITY): Payer: Medicare Other | Admitting: Occupational Therapy

## 2014-06-17 LAB — GLUCOSE, CAPILLARY
GLUCOSE-CAPILLARY: 193 mg/dL — AB (ref 70–99)
GLUCOSE-CAPILLARY: 209 mg/dL — AB (ref 70–99)
Glucose-Capillary: 223 mg/dL — ABNORMAL HIGH (ref 70–99)
Glucose-Capillary: 244 mg/dL — ABNORMAL HIGH (ref 70–99)

## 2014-06-17 NOTE — Progress Notes (Signed)
Occupational Therapy Session Note  Patient Details  Name: Jeffrey [name] MRN: [id number] Date of Birth: 1940/07/17  Today's Date: 06/17/2014 OT Individual Time: 0800-0903 OT Individual Time Calculation (min): 63 min    Short Term Goals: Week 1:  OT Short Term Goal 1 (Week 1): Pt will peform UB bathing with supervision sitting unsupported. OT Short Term Goal 2 (Week 1): Pt will complete LB bathing sit to stand with mod assist for 2 consecutive sessions. OT Short Term Goal 3 (Week 1): Pt will perform LB dressing with mod assist 2 consecutive sessions. OT Short Term Goal 4 (Week 1): Pt will demonstrate increased awareness of the RUE by placing it in his lap prior to transfers, and LB selfcare with no more than mod instructional cueing.  OT Short Term Goal 5 (Week 1): Pt will perform toilet transfer with max assist stand pivot.   Skilled Therapeutic Interventions/Progress Updates:    Pt performed toilet transfer to begin session.  Max assist stand pivot with decreased ability to move the RLE during transfer.  He was able to complete toilet hygiene in sitting but needed max assist for clothing management.  Ambulated from the toilet to the shower seat with max assist from therapist and step by step cueing secondary to decreased motor planning and sequencing.  He was able to complete UB bathing with min assist and LB bathing with max assist sit to stand.  He continues to need supplemental O2 at 2Ls as sats decrease to 87% on room air.  Mod facilitation for integration of the RUE into bathing as well.  He continues to demonstrate decreased proprioception and sensation requiring max questioning cues to position it into his lap after and before transitional movements.  He was able to transfer stand pivot to the wheelchair from the tub seat with max assist.  Dressing completed sit to stand at the sink.  Mod instructional cueing for hemi techniques for donning pants and shirt.  Pt needed mod assist for sit to  stand when attempting to pull pants over his hips however.  Therapist assisted with donning TEDs and shoes secondary to decreased time.    Therapy Documentation Precautions:  Precautions Precautions: Fall Precaution Comments: right hemiparesis, diplopia, decreased proprioception throughout the RUE and RLE Restrictions Weight Bearing Restrictions: No  Pain: Pain Assessment Pain Assessment: No/denies pain ADL: See FIM for current functional status  Therapy/Group: Individual Therapy  Chiffon Kittleson OTR/L 06/17/2014, 12:27 PM

## 2014-06-17 NOTE — Progress Notes (Signed)
Physical Therapy Session Note  Patient Details  Name: Jeffrey [name] MRN: [id number] Date of Birth: Jan 23, 1941  Today's Date: 06/17/2014 PT Individual Time: 1530-1600 PT Individual Time Calculation (min): 30 min   Short Term Goals: Week 1:  PT Short Term Goal 1 (Week 1): Pt will perform supine<>sit with mod A and 50% cueing with HOB elevated and rails.  PT Short Term Goal 2 (Week 1): Pt will perform bed<> w/c transfer with total A +1 and 75% cueing. PT Short Term Goal 3 (Week 1): Pt will ambulate of 20' with +2 A and 100% cueing.  PT Short Term Goal 4 (Week 1): Pt will propel w/c 50' in controlled environment with mod A and 50% cues.  PT Short Term Goal 5 (Week 1): Pt will perform dynamic sitting balance without UE support for >5 mins with supervision and 25% cueing.    Skilled Therapeutic Interventions/Progress Updates:    Pt received seated in w/c on 2 L/min supplemental O2, [place]. Session focused on gait training. Transported pt to hallway, where pt performed gait x42' in controlled environment with +2A (3 musketeers assist), manual correction of step placement (secondary pt tendency toward R hip adduction/external rotation), manual stabilization to compensatory trunk extension during RLE advancement, manual stabilization of R knee (to prevent R genu recurvatum during R midstance), and anterolateral weight shift to R side; verbal cueing focused on timing of LLE advancement. Following ambulation trial, vitals were as follows: HR 103 bpm, SpO2 95% on 2 L/min O2. After seated rest break, removed supplemental O2. Pt performed w/c mobility x25' in controlled environment with bilat LE's with supervision, tactile cueing at R knee for weightbearing/proprioception; pt able to consistently advance RLE during w/c mobility without hands-on assist. W/c mobility ended secondary to significant pt fatigue. Noted SpO2 94% on RA, HR 86 (taken manually) and irregularly irregular; RN made aware. Transported pt  remaining distance to room, where pt was left seated in w/c with all needs within reach, pt in no apparent distress, and HR 90, SpO2 96% on RA.  Therapy Documentation Precautions:  Precautions Precautions: Fall Precaution Comments: right hemiparesis, diplopia, decreased proprioception throughout the RUE and RLE Restrictions Weight Bearing Restrictions: No Vital Signs: Therapy Vitals Temp: 97.8 F (36.6 C) Temp Source: Oral Pulse Rate: 86 (taken manually; irregular. RN made aware) BP: 140/74 mmHg Patient Position (if appropriate): Sitting Oxygen Therapy SpO2: 94 % O2 Device: Not Delivered O2 Flow Rate (L/min): 2 L/min Pain: Pain Assessment Pain Assessment: No/denies pain Locomotion : Ambulation Ambulation/Gait Assistance: 1: +2 Total assist Wheelchair Mobility Distance: 25'   See FIM for current functional status  Therapy/Group: Individual Therapy  Stefano Gaul 06/17/2014, 4:35 PM

## 2014-06-17 NOTE — Progress Notes (Signed)
Physical Therapy Session Note  Patient Details  Name: Jeffrey [name] MRN: [id number] Date of Birth: 11-Sep-1940  Today's Date: 06/17/2014 PT Individual Time: 0900-1000 PT Individual Time Calculation (min): 60 min   Short Term Goals: Week 1:  PT Short Term Goal 1 (Week 1): Pt will perform supine<>sit with mod A and 50% cueing with HOB elevated and rails.  PT Short Term Goal 2 (Week 1): Pt will perform bed<> w/c transfer with total A +1 and 75% cueing. PT Short Term Goal 3 (Week 1): Pt will ambulate of 20' with +2 A and 100% cueing.  PT Short Term Goal 4 (Week 1): Pt will propel w/c 50' in controlled environment with mod A and 50% cues.  PT Short Term Goal 5 (Week 1): Pt will perform dynamic sitting balance without UE support for >5 mins with supervision and 25% cueing.        Skilled Therapeutic Interventions/Progress Updates:  Tx focused on functional mobility training, WC porpulsion, dynamic sitting, balance, and NMR via forced use, manual facilitation, and multi-modal cues with mirror for feedback.   Pt instructed in WC propulsion without lap tray due to obstruction 2x150' with Min A to avoid R collision.   Pt instructed in squat-pivot transfer training with 1 trial +2 A and 1 trial with Max  A of 1, pt performing >50% with cues for technique and manual facilitation for anterior translation, controlled descent.   NMR via dynamic sitting balance with focus on midline and RUE management - lateral and anterior weight shifting in small ranges x20min without UE support (hands in lap) with close S  - reciprocal scooting, lateral scooting - reach/placing cones in/outside BOS with R/L UEs - forward leans to K-bench wihtout>with hip elevation, max A for assist - sit<>stands with UE support on therapist with max A for anterior translation and stability - stood 2x45 sec and 1 x20sec - HR elevated - mirror for visual feedback of midline  Pt left up in [facility] with lap tray and all needs in reach.  Basic back added for support.       Therapy Documentation Precautions:  Precautions Precautions: Fall Precaution Comments: right hemiparesis, diplopia, decreased proprioception throughout the RUE and RLE Restrictions Weight Bearing Restrictions: No    Vital Signs: Therapy Vitals Pulse Rate: (!) 117 Patient Position (if appropriate): Sitting Oxygen Therapy SpO2: 96 % O2 Device: Nasal Cannula Pain: Pain Assessment Pain Assessment: No/denies pain  See FIM for current functional status  Therapy/Group: Individual Therapy   Kennieth Rad, PT, DPT   06/17/2014, 9:33 AM

## 2014-06-17 NOTE — Progress Notes (Signed)
Speech Language Pathology Daily Session Note  Patient Details  Name: Jeffrey [name] MRN: [id number] Date of Birth: 1940-09-16  Today's Date: 06/17/2014 SLP Individual Time: 1400-1500 SLP Individual Time Calculation (min): 60 min  Short Term Goals: Week 1: SLP Short Term Goal 1 (Week 1): Pt willl utilize compensatory aids to facilitate recall of new information during basic, familiar tasks with min assist verbal cues  SLP Short Term Goal 2 (Week 1): Pt will recognize and correct errors in the moment when completing basic, familiar home management tasks with min assist verbal cues.   SLP Short Term Goal 3 (Week 1): Pt will return demonstration of 2 safety precautions during structured and unstructured therapeutic tasks with min assist verbal cues.    Skilled Therapeutic Interventions:  Pt was seen for skilled ST targeting cognitive goals.  Upon arrival, pt was seated upright in wheelchair, awake, alert, and agreeable to participate in [place].  SLP facilitated the session with a novel, semi-complex problem solving task targeting mental flexibility, abstract reasoning, and error awareness.  Pt required overall mod assist verbal and visual cues to complete the abovementioned task due to organization and working memory deficits.  Pt was also limited by visuoperceptual deficits and required min verbal and visual cues to scan written materials in an organized manner in order to locate items on both side of a [place].  Pt presented with good awareness of deficits but had difficulty correcting errors in the moment without max assist verbal cues.  Pt left in room, upright in wheelchair, all needs left within reach.  Continue per current plan of care.    FIM:  Comprehension Comprehension Mode: Auditory Comprehension: 5-Follows basic conversation/direction: With extra time/assistive device Expression Expression Mode: Verbal Expression: 5-Expresses basic 90% of the time/requires cueing < 10% of the time. Social  Interaction Social Interaction: 5-Interacts appropriately 90% of the time - Needs monitoring or encouragement for participation or interaction. Problem Solving Problem Solving: 4-Solves basic 75 - 89% of the time/requires cueing 10 - 24% of the time Memory Memory: 4-Recognizes or recalls 75 - 89% of the time/requires cueing 10 - 24% of the time  Pain Pain Assessment Pain Assessment: No/denies pain  Therapy/Group: Individual Therapy  Breea Loncar, Selinda Orion 06/17/2014, 5:15 PM

## 2014-06-17 NOTE — Progress Notes (Signed)
Subjective/Complaints: No issues overnite, slept well Ate ~80% breakfast ROS- neg Objective: Vital Signs: Blood pressure 142/75, pulse 84, temperature 98.2 F (36.8 C), temperature source Oral, resp. rate 17, height 5\' 8"  (1.727 m), weight 72.984 kg (160 lb 14.4 oz), SpO2 91 %. No results found. Results for orders placed or performed during the hospital encounter of 06/11/14 (from the past 72 hour(s))  Glucose, capillary     Status: Abnormal   Collection Time: 06/14/14 11:46 AM  Result Value Ref Range   Glucose-Capillary 197 (H) 70 - 99 mg/dL   Comment 1 Notify RN   Glucose, capillary     Status: Abnormal   Collection Time: 06/14/14  4:31 PM  Result Value Ref Range   Glucose-Capillary 204 (H) 70 - 99 mg/dL   Comment 1 Notify RN   Glucose, capillary     Status: Abnormal   Collection Time: 06/14/14  9:58 PM  Result Value Ref Range   Glucose-Capillary 205 (H) 70 - 99 mg/dL  Glucose, capillary     Status: Abnormal   Collection Time: 06/15/14  7:00 AM  Result Value Ref Range   Glucose-Capillary 186 (H) 70 - 99 mg/dL  Glucose, capillary     Status: Abnormal   Collection Time: 06/15/14 11:14 AM  Result Value Ref Range   Glucose-Capillary 246 (H) 70 - 99 mg/dL   Comment 1 Notify RN   Glucose, capillary     Status: Abnormal   Collection Time: 06/15/14  4:37 PM  Result Value Ref Range   Glucose-Capillary 238 (H) 70 - 99 mg/dL   Comment 1 Notify RN   Glucose, capillary     Status: Abnormal   Collection Time: 06/15/14  8:53 PM  Result Value Ref Range   Glucose-Capillary 223 (H) 70 - 99 mg/dL  Glucose, capillary     Status: Abnormal   Collection Time: 06/16/14  6:31 AM  Result Value Ref Range   Glucose-Capillary 183 (H) 70 - 99 mg/dL  Glucose, capillary     Status: Abnormal   Collection Time: 06/16/14 11:25 AM  Result Value Ref Range   Glucose-Capillary 261 (H) 70 - 99 mg/dL   Comment 1 Notify RN   Glucose, capillary     Status: Abnormal   Collection Time: 06/16/14  4:38 PM   Result Value Ref Range   Glucose-Capillary 198 (H) 70 - 99 mg/dL   Comment 1 Notify RN   Glucose, capillary     Status: Abnormal   Collection Time: 06/16/14  9:17 PM  Result Value Ref Range   Glucose-Capillary 199 (H) 70 - 99 mg/dL  Glucose, capillary     Status: Abnormal   Collection Time: 06/17/14  6:31 AM  Result Value Ref Range   Glucose-Capillary 193 (H) 70 - 99 mg/dL     HEENT: tape over lens  of eyeglass on RIght Cardio: RRR Resp: CTA B/L and unlabored GI: BS positive and NT, ND Extremity:  Pulses positive and No Edema Skin:   Intact Neuro: Alert/Oriented, Abnormal Sensory reduced sensation RUE and RLE, Abnormal Motor 2- R delt , bi, tri, grip, HF, KN, ankle DF is trace and Other ataxia RUE Musc/Skel:  Edema RLE pretib, no erythema no tenderness , no pain with R Ankle ROM Gen NAD  Assessment/Plan: 1. Functional deficits secondary to Left thalamic ICH which require 3+ hours per day of interdisciplinary therapy in a comprehensive inpatient rehab setting. Physiatrist is providing close team supervision and 24 hour management of active medical problems  listed below. Physiatrist and rehab team continue to assess barriers to discharge/monitor patient progress toward functional and medical goals. FIM: FIM - Bathing Bathing Steps Patient Completed: Chest, Abdomen, Right upper leg, Left upper leg, Right Arm Bathing: 3: Mod-Patient completes 5-7 5f 10 parts or 50-74%  FIM - Upper Body Dressing/Undressing Upper body dressing/undressing steps patient completed: Thread/unthread left sleeve of pullover shirt/dress, Put head through opening of pull over shirt/dress, Pull shirt over trunk Upper body dressing/undressing: 4: Min-Patient completed 75 plus % of tasks FIM - Lower Body Dressing/Undressing Lower body dressing/undressing: 1: Total-Patient completed less than 25% of tasks  FIM - Toileting Toileting steps completed by patient: Performs perineal hygiene Toileting: 2:  Max-Patient completed 1 of 3 steps  FIM - Radio producer Devices: Bedside commode, Grab bars Toilet Transfers: 2-To toilet/BSC: Max A (lift and lower assist), 2-From toilet/BSC: Max A (lift and lower assist)  FIM - Control and instrumentation engineer Devices: Arm rests Bed/Chair Transfer: 2: Bed > Chair or W/C: Max A (lift and lower assist), 1: Two helpers (+2 for w/c>mat)  FIM - Locomotion: Wheelchair Distance: 130 Locomotion: Wheelchair: 2: Travels 50 - 149 ft with minimal assistance (Pt.>75%) FIM - Locomotion: Ambulation Locomotion: Ambulation Assistive Devices: Other (comment) (+2 '3 musketeers') Ambulation/Gait Assistance: 1: +2 Total assist Locomotion: Ambulation: 1: Two helpers  Comprehension Comprehension Mode: Auditory Comprehension: 5-Follows basic conversation/direction: With extra time/assistive device  Expression Expression Mode: Verbal Expression: 5-Expresses basic 90% of the time/requires cueing < 10% of the time.  Social Interaction Social Interaction: 5-Interacts appropriately 90% of the time - Needs monitoring or encouragement for participation or interaction.  Problem Solving Problem Solving: 4-Solves basic 75 - 89% of the time/requires cueing 10 - 24% of the time  Memory Memory: 4-Recognizes or recalls 75 - 89% of the time/requires cueing 10 - 24% of the time  Medical Problem List and Plan: 1. Functional deficits secondary to Left Thalamic ICH 2. DVT Prophylaxis/Anticoagulation: SCDs.Monitor for signs of DVT 3. Pain Management: Tylenol as needed 4. HCAP. Encourage incentive spirometer. Vancomycin and Zosyn discontinued and changed to Levaquin 06/11/2014. Check oxygen saturations every shift, starting to wean O2 5. Neuropsych: This patient is capable of making decisions on his own behalf. 6. Skin/Wound Care: Routine skin checks 7. Fluids/Electrolytes/Nutrition: Strict I and O's with follow-up chemistries 8. Mild  ileus. Diet has been advanced to regular. No nausea vomiting. Establish bowel program 9. Diabetes mellitus and peripheral neuropathy. Latest hemoglobin A1c 7.8. Patient with history of insulin pump followed by Dr. Buddy Duty and endocrine services. Current sliding scale insulin 10. Coronary artery disease with balloon angioplasty. No chest pain or shortness of breath. 11. Urinary retention. Continue Flomax. Foley tube remain in place. Follow-up urology services outpatient 12.  Peripheral edema related to weakness RLE, TED hose LOS (Days) 6 A FACE TO FACE EVALUATION WAS PERFORMED  KIRSTEINS,ANDREW E 06/17/2014, 8:01 AM

## 2014-06-18 ENCOUNTER — Inpatient Hospital Stay (HOSPITAL_COMMUNITY): Payer: Medicare Other | Admitting: Physical Therapy

## 2014-06-18 ENCOUNTER — Inpatient Hospital Stay (HOSPITAL_COMMUNITY): Payer: Medicare Other | Admitting: Occupational Therapy

## 2014-06-18 ENCOUNTER — Inpatient Hospital Stay (HOSPITAL_COMMUNITY): Payer: Medicare Other | Admitting: Speech Pathology

## 2014-06-18 DIAGNOSIS — I69398 Other sequelae of cerebral infarction: Secondary | ICD-10-CM

## 2014-06-18 DIAGNOSIS — R209 Unspecified disturbances of skin sensation: Secondary | ICD-10-CM

## 2014-06-18 DIAGNOSIS — I69898 Other sequelae of other cerebrovascular disease: Secondary | ICD-10-CM

## 2014-06-18 DIAGNOSIS — R208 Other disturbances of skin sensation: Secondary | ICD-10-CM

## 2014-06-18 LAB — GLUCOSE, CAPILLARY
GLUCOSE-CAPILLARY: 216 mg/dL — AB (ref 70–99)
GLUCOSE-CAPILLARY: 217 mg/dL — AB (ref 70–99)
GLUCOSE-CAPILLARY: 204 mg/dL — AB (ref 70–99)
Glucose-Capillary: 191 mg/dL — ABNORMAL HIGH (ref 70–99)

## 2014-06-18 MED ORDER — INSULIN GLARGINE 100 UNIT/ML ~~LOC~~ SOLN
10.0000 [IU] | Freq: Every day | SUBCUTANEOUS | Status: DC
Start: 2014-06-18 — End: 2014-06-25
  Administered 2014-06-18 – 2014-06-24 (×7): 10 [IU] via SUBCUTANEOUS
  Filled 2014-06-18 (×8): qty 0.1

## 2014-06-18 NOTE — Patient Care Conference (Signed)
Inpatient RehabilitationTeam Conference and Plan of Care Update Date: 06/18/2014   Time: 11;00 AM    Patient Name: Jeffrey [name]      Medical Record Number: [id number]  Date of Birth: Dec 04, 1940 Sex: Male         Room/Bed: 4W18C/4W18C-01 Payor Info: Payor: [employer]UE [employer] MEDICARE / Plan: BCBS MEDICARE / Product Type: *No Product type* /    Admitting Diagnosis: LEFT THALAMIC ICH  Admit Date/Time:  06/11/2014  6:01 PM Admission Comments: No comment available   Primary Diagnosis:  Nontraumatic intracranial hemorrhage Principal Problem: Nontraumatic intracranial hemorrhage  Patient Active Problem List   Diagnosis Date Noted  . Alterations of sensations following CVA (cerebrovascular accident) 06/18/2014  . ICH (intracerebral hemorrhage) 06/11/2014  . Acute respiratory failure with hypoxia 06/05/2014  . HCAP (healthcare-associated pneumonia) 06/05/2014  . Adynamic ileus 06/05/2014  . Sepsis 06/05/2014  . Thrombocytopenia 06/05/2014  . Acute renal failure 06/05/2014  . Chronic diastolic heart failure, NYHA class 1 06/05/2014  . Ileus of unspecified type 06/05/2014  . Intracerebral hemorrhage 06/04/2014  . Tobacco abuse 06/03/2014  . Obesity 06/03/2014  . Nontraumatic intracranial hemorrhage 05/30/2014  . Benign paroxysmal positional vertigo 04/28/2014  . 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 Right carotid bruit   . Polyneuropathy, diabetic 08/05/2012  . Seasonal allergic rhinitis   . Trigger thumb of left hand 10/12/2011  . Acute chest wall pain 06/01/2011  . Epigastric abdominal pain 11/11/2010  . Gout   . Adenomatous polyps   . CAD (coronary artery disease)   . Essential hypertension   . Hyperlipidemia   . Type 2 diabetes, uncontrolled, with neuropathy   . Ejection fraction     Expected Discharge Date: Expected Discharge Date: 07/02/14  Team Members Present: Physician leading conference: Dr. Alysia Penna Social Worker Present: Ovidio Kin, LCSW Nurse  Present: Heather Roberts, RN PT Present: Georjean Mode, PT;Blair Hobble, PT OT Present: Simonne Come, Dorothyann Gibbs, OT SLP Present: Windell Moulding, SLP PPS Coordinator present : Daiva Nakayama, RN, CRRN     Current Status/Progress Goal Weekly Team Focus  Medical   Off oxygen, tolerating therapy well, severe sensory deficits right upper and right lower extremity  Maximize neurologic recovery, home discharge  Training patient to visualize right upper and right lower extremity during movement   Bowel/Bladder   Pt continent of bowel ; LBM 4-13. Foley inplace. Removed today  manage bowel and bladder min assist  assess bladder when foley removed- PVR and I&O cath if needed   Swallow/Nutrition/ Hydration     na        ADL's   min assist UB selfcare, mod to max assist LB selfcare and max assist for stand pivot transfers.  Decreased proprioception and sensation in the RUE with neglect noted.  Gross digit flexion and extension present with less active movement at the shoulder.  Increased motor apraxia also present.   min assist overall  selfcare re-training, standing balance, neuromuscular re-education, visual re-training, pt/family education   Mobility   Max A to +2A for bed <> w/c transfers, +2A standing and ambulation  Min A transfers and ambulation; supervision w/c mobility  Functional transfers, standing, ambulation, activity tolerance, R NMR   Communication     na        Safety/Cognition/ Behavioral Observations  mod assist   supervision   working memory, organization, emergent awareness of deficits, and semi-complex problem solving    Pain   denies pain  Skin   no skin breakdown noted  remain free of skin breakdown min assist  turn/ boost frequently      *See Care Plan and progress notes for long and short-term goals.  Barriers to Discharge: No recovery of severe sensory deficits as far    Possible Resolutions to Barriers:  Continue reeducation efforts    Discharge  Planning/Teaching Needs:  Wife would like to have pt return home but unable to provide 24 hr care, so probable plan is NHP from here.      Team Discussion:  Off O2, making progress slowly-foley to be discharged today and voiding trial begun. Fatigue an issue. Goals set at min assist level. R-neglect of arm and body. Trying hard in therapies.  Revisions to Treatment Plan:  Unsure if plan home versus NHP   Continued Need for Acute Rehabilitation Level of Care: The patient requires daily medical management by a physician with specialized training in physical medicine and rehabilitation for the following conditions: Daily direction of a multidisciplinary physical rehabilitation program to ensure safe treatment while eliciting the highest outcome that is of practical value to the patient.: Yes Daily medical management of patient stability for increased activity during participation in an intensive rehabilitation regime.: Yes Daily analysis of laboratory values and/or radiology reports with any subsequent need for medication adjustment of medical intervention for : Neurological problems;Other  Yossef Gilkison, Gardiner Rhyme 06/18/2014, 3:12 PM

## 2014-06-18 NOTE — Progress Notes (Signed)
Occupational Therapy Session Note  Patient Details  Name: Jeffrey [name] MRN: [id number] Date of Birth: August 07, 1940  Today's Date: 06/18/2014 OT Individual Time: 1315-1345 OT Individual Time Calculation (min): 30 min     Skilled Therapeutic Interventions/Progress Updates:    1:1 focus on eye exercises to increased field of vision without diplopia. Pt able to have central vision without diplopia. Worked on tracking laterally in both directions concentrating on seeing a clear image. Gave homework instructions and pt able to demonstrate learning. Also focus on transfer training with squat pivot and stand step transfers. Pt able to perform transfers with mod A with extra time and mod cuing for setup. Pt required A to maintain safe placement of right UE in transfers and in standing. Pt demonstrates exaggerated associative movements in right UE with standing balance task. Pt maintain his O2 sats above 95% on RA entire session. Left pt in w/c in room.  Therapy Documentation Precautions:  Precautions Precautions: Fall Precaution Comments: right hemiparesis, diplopia, decreased proprioception throughout the RUE and RLE Restrictions Weight Bearing Restrictions: No Pain: Pain Assessment Pain Assessment: No/denies pain  See FIM for current functional status  Therapy/Group: Individual Therapy  Willeen Cass [facility] 06/18/2014, 3:21 PM

## 2014-06-18 NOTE — Progress Notes (Signed)
Social Work Patient ID: Jeffrey [name], male   DOB: 12-23-1940, 74 y.o.   MRN: [id number] Met with pt and wife who was visiting here to discuss team conference goals-min assist level and discharge 4/27.  Discussed if anyone available to provide the hands on care he would need. Wife reports she is being discharged from [facility] this Friday and their daughter will stay with a few days.  Wife will not be able to provide physical care to husband and he may need  To consider short term NHP.  He and wife will discuss this, probably will be short term NHP.  Wife states: " He can take my bed at [facility] will need to get insurance to approval. Dicussed option of hiring private duty caregivers.

## 2014-06-18 NOTE — Progress Notes (Signed)
Occupational Therapy Weekly Progress Note  Patient Details  Name: Jeffrey [name] MRN: [id number] Date of Birth: [birth year]  Beginning of progress report period: June 12, 2014 End of progress report period: June 18, 2014  Today's Date: 06/18/2014 OT Individual Time: 1700-1749 OT Individual Time Calculation (min): 56 min    Patient has met 1 of 5 short term goals.  He continues to demonstrate decreased sensation and awareness of the RUE as well as decreased motor planning.  Diplopia is also still present at times but has improved, with pt being able to scan to both the left and right sides without report of double vision.  However, he still reports that it comes and goes.  Currently tolerating binasal taping to his glasses.  Mod assist needed for sit to stand with mod to max assist for dynamic standing balance when attempting LB selfcare.  He has a tendency to demonstrate motor planning deficits with trunk control as well, resulting in posterior LOB at times.  Max assist for all stand pivot transfers with decreased ability to adequately advance the LLE, unless mod facilitation is given.  Pt's endurance is still limited and O2 sats continue to fluctuate on RA from 87-93%.  Recommend continued comprehensive OT to addrress the above deficits as pt does not have physical assist at discharge.  Family will have to arrange 24 hour assist if discharging home.  If no assist available will need SNF for follow-up.   Patient continues to demonstrate the following deficits: decreased balance, decreased endurance, decreased motor planning and control, and decreased RUE functional use,  and therefore will continue to benefit from skilled OT intervention to enhance overall performance with BADL.  Patient progressing toward long term goals..  Continue plan of care.  OT Short Term Goals Week 2:  OT Short Term Goal 1 (Week 2): Pt will peform UB bathing with supervision sitting unsupported. OT Short Term Goal 2  (Week 2): Pt will complete LB bathing sit to stand with mod assist for 2 consecutive sessions. OT Short Term Goal 3 (Week 2): Pt will perform LB dressing with mod assist 2 consecutive sessions. OT Short Term Goal 4 (Week 2): Pt will demonstrate increased awareness of the RUE by placing it in his lap prior to transfers, and LB selfcare with no more than mod instructional cueing.  OT Short Term Goal 5 (Week 2): Pt will perform stand pivot transfer to the 3:1 with mod assist 2 consecutive sessions.    Skilled Therapeutic Interventions/Progress Updates:    Pt worked on bathing and dressing at the sink per his own preference.  He was able to sequence through the task with min instructional cueing.  Mod assist needed to help use the RUE for washing as pt tends to demonstrate decreased sensation and motor control with functional use.  Noted fisting when not visually attending to the hand, however he can activate digit extension to command with increased time.  Mod assist needed for sit to stand during LB bathing.  Pt with increased LOB posteriorly in standing with decreased ability to maintain right knee extension as well.  He was able to recall hemi techniques for donning pullover shirt and his shorts, and began dressing the right side.  He needs mod assist for crossing and maintaining the RLE over the left knee when doffing his socks or threading his leg in the brief or shorts.  Oxygen sats 93% on room air with HR 102 before and during bathing tasks.   Therapy Documentation  Precautions:  Precautions Precautions: Fall Precaution Comments: right hemiparesis, diplopia, decreased proprioception throughout the RUE and RLE Restrictions Weight Bearing Restrictions: No  Pain: Pain Assessment Pain Assessment: No/denies pain ADL: See FIM for current functional status  Therapy/Group: Individual Therapy  Fallynn Gravett OTR/L 06/18/2014, 12:55 PM

## 2014-06-18 NOTE — Progress Notes (Signed)
Physical Therapy Session Note  Patient Details  Name: Jeffrey [name] MRN: [id number] Date of Birth: 03/17/1940  Today's Date: 06/18/2014 PT Individual Time: 1500-1600 PT Individual Time Calculation (min): 60 min   Short Term Goals: Week 1:  PT Short Term Goal 1 (Week 1): Pt will perform supine<>sit with mod A and 50% cueing with HOB elevated and rails.  PT Short Term Goal 2 (Week 1): Pt will perform bed<> w/c transfer with total A +1 and 75% cueing. PT Short Term Goal 3 (Week 1): Pt will ambulate of 20' with +2 A and 100% cueing.  PT Short Term Goal 4 (Week 1): Pt will propel w/c 50' in controlled environment with mod A and 50% cues.  PT Short Term Goal 5 (Week 1): Pt will perform dynamic sitting balance without UE support for >5 mins with supervision and 25% cueing.    Skilled Therapeutic Interventions/Progress Updates:  Pt received in w/c with wife present. Pt was concerned about CSW's suggestion of SNF placement following d/c from IP rehab. SPT/PT educated patient on importance of 24/7 assistance which could not be provided by wife at this time.  Pt verbalized understanding and agreed to therapy. Session focused on w/c mobility, functional transfers, and NMR (R LE muscle activation).  Blocked practice of w/c<>mat transfers performed with mod A and max cueing for technique.  Pt demonstrated carry over of 50% of transfer set-up during the blocked practice.  Pt performed multiple sit<>stands with Stedy.  Tactile and verbal cues provided for symmetrical weight bearing, pt initiation of transitional movements, and anterior weight shifting.  The Stedy was removed and pt was able to perform multiple sit<>stands from EOM with mod A (+2 for safety) with mod verbal and tactile cues (rib cage for erect trunk flexion and approximation at R knee).  Pt demonstrated posterior preference in standing without awareness despite verbal cueing. Pt propelled w/c with L UE/LE 130' x2 to/from room with supervision.   Pt's SpO2 remained above 95% throughout the session on RA.  Pt was left in w/c with wife present and all needs within reach.     Therapy Documentation Precautions:  Precautions Precautions: Fall Precaution Comments: right hemiparesis, diplopia, decreased proprioception throughout the RUE and RLE Restrictions Weight Bearing Restrictions: No Vital Signs: Therapy Vitals Temp: 98.6 F (37 C) Temp Source: Oral Pulse Rate: 88 Resp: 18 BP: 120/80 mmHg Patient Position (if appropriate): Sitting Oxygen Therapy SpO2: 98 % O2 Device: Not Delivered Pain: Pain Assessment Pain Assessment: No/denies pain Locomotion : Wheelchair Mobility Distance: 100'   See FIM for current functional status  Therapy/Group: Individual Therapy  Jordanna Hendrie 06/18/2014, 4:06 PM

## 2014-06-18 NOTE — Progress Notes (Signed)
Subjective/Complaints: Off O2, no breathing issues ROS- neg Objective: Vital Signs: Blood pressure 115/83, pulse 91, temperature 98.4 F (36.9 C), temperature source Oral, resp. rate 19, height _0  (1.727 m), weight 72.984 kg (160 lb 14.4 oz), SpO2 97 %. No results found. Results for orders placed or performed during the hospital encounter of 06/11/14 (from the past 72 hour(s))  Glucose, capillary     Status: Abnormal   Collection Time: 06/15/14 11:14 AM  Result Value Ref Range   Glucose-Capillary 246 (H) 70 - 99 mg/dL   Comment 1 Notify RN   Glucose, capillary     Status: Abnormal   Collection Time: 06/15/14  4:37 PM  Result Value Ref Range   Glucose-Capillary 238 (H) 70 - 99 mg/dL   Comment 1 Notify RN   Glucose, capillary     Status: Abnormal   Collection Time: 06/15/14  8:53 PM  Result Value Ref Range   Glucose-Capillary 223 (H) 70 - 99 mg/dL  Glucose, capillary     Status: Abnormal   Collection Time: 06/16/14  6:31 AM  Result Value Ref Range   Glucose-Capillary 183 (H) 70 - 99 mg/dL  Glucose, capillary     Status: Abnormal   Collection Time: 06/16/14 11:25 AM  Result Value Ref Range   Glucose-Capillary 261 (H) 70 - 99 mg/dL   Comment 1 Notify RN   Glucose, capillary     Status: Abnormal   Collection Time: 06/16/14  4:38 PM  Result Value Ref Range   Glucose-Capillary 198 (H) 70 - 99 mg/dL   Comment 1 Notify RN   Glucose, capillary     Status: Abnormal   Collection Time: 06/16/14  9:17 PM  Result Value Ref Range   Glucose-Capillary 199 (H) 70 - 99 mg/dL  Glucose, capillary     Status: Abnormal   Collection Time: 06/17/14  6:31 AM  Result Value Ref Range   Glucose-Capillary 193 (H) 70 - 99 mg/dL  Glucose, capillary     Status: Abnormal   Collection Time: 06/17/14 11:29 AM  Result Value Ref Range   Glucose-Capillary 223 (H) 70 - 99 mg/dL   Comment 1 Notify RN   Glucose, capillary     Status: Abnormal   Collection Time: 06/17/14  4:35 PM  Result Value Ref  Range   Glucose-Capillary 244 (H) 70 - 99 mg/dL   Comment 1 Notify RN   Glucose, capillary     Status: Abnormal   Collection Time: 06/17/14  9:52 PM  Result Value Ref Range   Glucose-Capillary 209 (H) 70 - 99 mg/dL  Glucose, capillary     Status: Abnormal   Collection Time: 06/18/14  6:53 AM  Result Value Ref Range   Glucose-Capillary 191 (H) 70 - 99 mg/dL     HEENT: tape over lens  of eyeglass on RIght Cardio: RRR Resp: CTA B/L and unlabored GI: BS positive and NT, ND Extremity:  Pulses positive and No Edema Skin:   Intact Neuro: Alert/Oriented, Abnormal Sensory reduced sensation RUE and RLE, Abnormal Motor 3- R delt , bi, tri, grip, HF, KN, ankle DF is trace and Other ataxia RUE Musc/Skel:  Edema RLE pretib, no erythema no tenderness , no pain with R Ankle ROM Gen NAD  Assessment/Plan: 1. Functional deficits secondary to Left thalamic ICH which require 3+ hours per day of interdisciplinary therapy in a comprehensive inpatient rehab setting. Physiatrist is providing close team supervision and 24 hour management of active medical problems listed below. Physiatrist  and rehab team continue to assess barriers to discharge/monitor patient progress toward functional and medical goals. Team conference today please see physician documentation under team conference tab, met with team face-to-face to discuss problems,progress, and goals. Formulized individual treatment plan based on medical history, underlying problem and comorbidities. FIM: FIM - Bathing Bathing Steps Patient Completed: Chest, Abdomen, Right upper leg, Left upper leg, Right Arm, Left lower leg (including foot), Front perineal area Bathing: 3: Mod-Patient completes 5-7 [id number] parts or 50-74%  FIM - Upper Body Dressing/Undressing Upper body dressing/undressing steps patient completed: Thread/unthread right sleeve of pullover shirt/dresss, Thread/unthread left sleeve of pullover shirt/dress, Put head through opening of pull  over shirt/dress Upper body dressing/undressing: 4: Min-Patient completed 75 plus % of tasks FIM - Lower Body Dressing/Undressing Lower body dressing/undressing steps patient completed: Thread/unthread left underwear leg, Thread/unthread left pants leg Lower body dressing/undressing: 2: Max-Patient completed 25-49% of tasks  FIM - Toileting Toileting steps completed by patient: Performs perineal hygiene Toileting: 2: Max-Patient completed 1 of 3 steps  FIM - TRadio producerDevices: Bedside commode, Grab bars Toilet Transfers: 2-To toilet/BSC: Max A (lift and lower assist), 2-From toilet/BSC: Max A (lift and lower assist)  FIM - BEngineer, siteAssistive Devices: Arm rests Bed/Chair Transfer: 3: Supine > Sit: Mod A (lifting assist/Pt. 50-74%/lift 2 legs, 1: Bed > Chair or W/C: Total A (helper does all/Pt. < 25%)  FIM - Locomotion: Wheelchair Distance: 25' Locomotion: Wheelchair: 1: Travels less than 50 ft with supervision, cueing or coaxing FIM - Locomotion: Ambulation Locomotion: Ambulation Assistive Devices: Other (comment) (3 musketeers) Ambulation/Gait Assistance: 1: +2 Total assist Locomotion: Ambulation: 1: Two helpers  Comprehension Comprehension Mode: Auditory Comprehension: 5-Understands complex 90% of the time/Cues < 10% of the time  Expression Expression Mode: Verbal Expression: 6-Expresses complex ideas: With extra time/assistive device  Social Interaction Social Interaction: 6-Interacts appropriately with others with medication or extra time (anti-anxiety, antidepressant).  Problem Solving Problem Solving: 5-Solves complex 90% of the time/cues < 10% of the time  Memory Memory: 6-More than reasonable amt of time  Medical Problem List and Plan: 1. Functional deficits secondary to Left Thalamic ICH 2. DVT Prophylaxis/Anticoagulation: SCDs.Monitor for signs of DVT 3. Pain Management: Tylenol as needed 4. HCAP.  Encourage incentive spirometer. Vancomycin and Zosyn discontinued and changed to Levaquin 06/11/2014. Check oxygen saturations every shift, starting to wean O2 5. Neuropsych: This patient is capable of making decisions on his own behalf. 6. Skin/Wound Care: Routine skin checks 7. Fluids/Electrolytes/Nutrition: Strict I and O's with follow-up chemistries 8. Mild ileus. Diet has been advanced to regular. No nausea vomiting. Establish bowel program 9. Diabetes mellitus and peripheral neuropathy. Latest hemoglobin A1c 7.8. Patient with history of insulin pump followed by Dr. KBuddy Dutyand endocrine services. Current sliding scale insulin 10. Coronary artery disease with balloon angioplasty. No chest pain or shortness of breath. 11. Urinary retention. Continue Flomax. Foley tube remain in place. Follow-up urology services outpatient 12.  Peripheral edema related to weakness RLE, TED hose LOS (Days) 7 A FACE TO FACE EVALUATION WAS PERFORMED  KIRSTEINS,ANDREW E 06/18/2014, 7:57 AM

## 2014-06-18 NOTE — Progress Notes (Signed)
Foley removed at 1620 per order.

## 2014-06-18 NOTE — Progress Notes (Signed)
Speech Language Pathology Daily Session Note  Patient Details  Name: Jeffrey [name] MRN: [id number] Date of Birth: 29-Jun-1940  Today's Date: 06/18/2014 SLP Individual Time: 0902-0932 SLP Individual Time Calculation (min): 30 min  Short Term Goals: Week 1: SLP Short Term Goal 1 (Week 1): Pt willl utilize compensatory aids to facilitate recall of new information during basic, familiar tasks with min assist verbal cues  SLP Short Term Goal 2 (Week 1): Pt will recognize and correct errors in the moment when completing basic, familiar home management tasks with min assist verbal cues.   SLP Short Term Goal 3 (Week 1): Pt will return demonstration of 2 safety precautions during structured and unstructured therapeutic tasks with min assist verbal cues.    Skilled Therapeutic Interventions:  Pt was seen for skilled ST targeting cognitive goals.  Upon arrival, pt was seated upright in wheelchair, awake, alert, and agreeable to participate in [place].  SLP facilitated the session with a structured new learning activity targeting semi-complex functional problem solving.  Pt planned and executed a problem solving strategy during the abovementioned task with initial mod assist instructional cues faded to supervision.  Pt also utilized written aid to facilitate working memory with overall min assist verbal and visual cues. Pt left in room, upright in wheelchair, with all needs left within reach.    FIM:  Comprehension Comprehension Mode: Auditory Comprehension: 5-Understands complex 90% of the time/Cues < 10% of the time Expression Expression Mode: Verbal Expression: 6-Expresses complex ideas: With extra time/assistive device Social Interaction Social Interaction: 6-Interacts appropriately with others with medication or extra time (anti-anxiety, antidepressant). Problem Solving Problem Solving: 4-Solves basic 75 - 89% of the time/requires cueing 10 - 24% of the time Memory Memory: 4-Recognizes or recalls  75 - 89% of the time/requires cueing 10 - 24% of the time  Pain Pain Assessment Pain Assessment: No/denies pain  Therapy/Group: Individual Therapy  Jeffrey [name], Jeffrey [name] 06/18/2014, 11:35 AM

## 2014-06-19 ENCOUNTER — Inpatient Hospital Stay (HOSPITAL_COMMUNITY): Payer: Medicare Other | Admitting: Occupational Therapy

## 2014-06-19 ENCOUNTER — Inpatient Hospital Stay (HOSPITAL_COMMUNITY): Payer: Medicare Other | Admitting: Physical Therapy

## 2014-06-19 ENCOUNTER — Inpatient Hospital Stay (HOSPITAL_COMMUNITY): Payer: Medicare Other | Admitting: Speech Pathology

## 2014-06-19 LAB — GLUCOSE, CAPILLARY
GLUCOSE-CAPILLARY: 187 mg/dL — AB (ref 70–99)
GLUCOSE-CAPILLARY: 213 mg/dL — AB (ref 70–99)
Glucose-Capillary: 194 mg/dL — ABNORMAL HIGH (ref 70–99)
Glucose-Capillary: 253 mg/dL — ABNORMAL HIGH (ref 70–99)

## 2014-06-19 MED ORDER — MENTHOL 3 MG MT LOZG
1.0000 | LOZENGE | OROMUCOSAL | Status: DC | PRN
  Filled 2014-06-19: qty 9

## 2014-06-19 NOTE — Progress Notes (Signed)
Speech Language Pathology Weekly Progress and Session Note  Patient Details  Name: Jeffrey [name] MRN: [id number] Date of Birth: 12-04-1940  Beginning of progress report period: June 12, 2014 End of progress report period: June 19, 2014  Today's Date: 06/19/2014 SLP Individual Time: 0800-0900 SLP Individual Time Calculation (min): 60 min  Short Term Goals: Week 1: SLP Short Term Goal 1 (Week 1): Pt willl utilize compensatory aids to facilitate recall of new information during basic, familiar tasks with min assist verbal cues  SLP Short Term Goal 1 - Progress (Week 1): Met SLP Short Term Goal 2 (Week 1): Pt will recognize and correct errors in the moment when completing basic, familiar home management tasks with min assist verbal cues.   SLP Short Term Goal 2 - Progress (Week 1): Met SLP Short Term Goal 3 (Week 1): Pt will return demonstration of 2 safety precautions during structured and unstructured therapeutic tasks with min assist verbal cues.   SLP Short Term Goal 3 - Progress (Week 1): Met    New Short Term Goals: Week 2: SLP Short Term Goal 1 (Week 2): Pt willl utilize compensatory aids to facilitate recall of new information during semi-complex tasks with min assist verbal cues  SLP Short Term Goal 2 (Week 2): Pt will recognize and correct errors in the moment when completing semi-complex home management tasks with min assist verbal cues.   SLP Short Term Goal 3 (Week 2): Pt will return demonstration of 2 safety precautions during structured and unstructured therapeutic tasks with supervision cues.   SLP Short Term Goal 4 (Week 2): Pt will utilize word finding strategies in functional conversations to convey basic to semi-complex needs/wants with min assist verbal cues.   Weekly Progress Updates:  Pt made functional gains this reporting period and has met 3 out of 3 short term goals.  Currently, pt requires min assist during basic, familiar cognitive tasks due to decreased  working memory, decreased short term memory, decreased functional problem solving, and decreased emergent awareness of deficits.  Pt has demonstrated improvements in use of compensatory strategies to facilitate recall of new information.  Mild, persistent word finding deficits have also been noted this reporting period; therefore, SLP initiated short term goal for use of word finding strategies.  Pt would continue to benefit from skilled ST while inpatient in order to maximize functional independence and reduce burden of care prior to discharge.  Pt and family education is ongoing.  Anticipate that pt will need 24/7 supervision, assistance for medication and financial management, and ST follow up at next level of care following discharge.     Intensity: Minumum of 1-2 x/day, 30 to 90 minutes Frequency: 3 to 5 out of 7 days Duration/Length of Stay: 21-28 days  Treatment/Interventions: Speech/Language facilitation;Environmental controls;Patient/family education;Internal/external aids;Cognitive remediation/compensation;Cueing hierarchy;Functional tasks   Daily Session  Skilled Therapeutic Interventions: Pt was seen for skilled ST targeting cognitive goals.  Upon arrival, pt was seated upright in bed, awake, alert, and agreeable to participate in therapy.  SLP facilitated the session with a structured new learning activity targeting mental flexibility and working memory for functional problem solving.  Pt made associations between categories and pictures with min assist verbal cues for mental flexibility.  With spaced retrieval training and min question cues, pt was able to recall 3 out of 4 categories.  Pt generated category members with min-mod verbal cues for short term recall of previously named items and mental flexibility to name multiple members within the same category.  Pt is making good progress.  Goals updated on this date to reflect current progress and plan of care.          FIM:   Comprehension Comprehension Mode: Auditory Comprehension: 5-Understands complex 90% of the time/Cues < 10% of the time Expression Expression Mode: Verbal Expression: 5-Expresses basic needs/ideas: With extra time/assistive device Social Interaction Social Interaction: 6-Interacts appropriately with others with medication or extra time (anti-anxiety, antidepressant). Problem Solving Problem Solving: 4-Solves basic 75 - 89% of the time/requires cueing 10 - 24% of the time Memory Memory: 4-Recognizes or recalls 75 - 89% of the time/requires cueing 10 - 24% of the time General    Pain Pain Assessment Pain Assessment: No/denies pain  Therapy/Group: Individual Therapy  Tomaz Janis, Selinda Orion 06/19/2014, 4:28 PM

## 2014-06-19 NOTE — Progress Notes (Signed)
Occupational Therapy Session Note  Patient Details  Name: Jeffrey [name] MRN: [id number] Date of Birth: 15-Dec-1940  Today's Date: 06/19/2014 OT Individual Time: 1101-1200 OT Individual Time Calculation (min): 59 min    Short Term Goals: Week 2:  OT Short Term Goal 1 (Week 2): Pt will peform UB bathing with supervision sitting unsupported. OT Short Term Goal 2 (Week 2): Pt will complete LB bathing sit to stand with mod assist for 2 consecutive sessions. OT Short Term Goal 3 (Week 2): Pt will perform LB dressing with mod assist 2 consecutive sessions. OT Short Term Goal 4 (Week 2): Pt will demonstrate increased awareness of the RUE by placing it in his lap prior to transfers, and LB selfcare with no more than mod instructional cueing.  OT Short Term Goal 5 (Week 2): Pt will perform stand pivot transfer to the 3:1 with mod assist 2 consecutive sessions.    Skilled Therapeutic Interventions/Progress Updates:    Pt performed toileting to begin session.  Mod assist for stand pivot transfer from the wheelchair to the toilet.  He was able to complete clothing management with mod assist to push his pants and brief down and then toilet hygiene in sitting with min guard assist.  Ambulated to the shower seat with max assist with pt demonstrating decreased ability to advance the RLE smoothly, with increased scissoring noted.  Pt also with decreased ability to transition his weight forward onto the RLE with mod facilitation.  Completed bathing sit to stand with mod assist, including mod facilitation for RUE use.  Pt still with moderate motor planning deficits in the LUE.  Also noted during session that pt demonstrated coughing fit.  Pt with over 200 coughs during selfcare tasks.  Nursing notified of situation.  Pt's O2 sats 94% on room air throughout session.  He was able to [employer] for dressing with supervision.  Min assist for threading his LEs but he needed mod assist for standing balance to pull  them over his hips.     Therapy Documentation Precautions:  Precautions Precautions: Fall Precaution Comments: right hemiparesis, diplopia, decreased proprioception throughout the RUE and RLE Restrictions Weight Bearing Restrictions: No  Pain: Pain Assessment Pain Assessment: No/denies pain ADL: See FIM for current functional status  Therapy/Group: Individual Therapy  Cord Wilczynski OTR/L 06/19/2014, 4:34 PM

## 2014-06-19 NOTE — Progress Notes (Signed)
Occupational Therapy Session Note  Patient Details  Name: Jeffrey [name] MRN: [id number] Date of Birth: 01-07-1941  Today's Date: 06/19/2014 OT Individual Time: 0930-1000 OT Individual Time Calculation (min): 30 min    Short Term Goals: Week 1:  OT Short Term Goal 1 (Week 1): Pt will peform UB bathing with supervision sitting unsupported. OT Short Term Goal 1 - Progress (Week 1): Not met OT Short Term Goal 2 (Week 1): Pt will complete LB bathing sit to stand with mod assist for 2 consecutive sessions. OT Short Term Goal 2 - Progress (Week 1): Not met OT Short Term Goal 3 (Week 1): Pt will perform LB dressing with mod assist 2 consecutive sessions. OT Short Term Goal 3 - Progress (Week 1): Not met OT Short Term Goal 4 (Week 1): Pt will demonstrate increased awareness of the RUE by placing it in his lap prior to transfers, and LB selfcare with no more than mod instructional cueing.  OT Short Term Goal 4 - Progress (Week 1): Not met OT Short Term Goal 5 (Week 1): Pt will perform toilet transfer with max assist stand pivot.  OT Short Term Goal 5 - Progress (Week 1): Met  Skilled Therapeutic Interventions/Progress Updates:    1:1 continued to assess O2 throughout session on RA- able to maintain sats at 93-94% on RA with activity. Focus on bed mobility including rolling and sidelying to sit on right side of bed. Promotion of normal patterns of movement in all planes with facilitation through PNF - D1 &D2 for strengthen, coordination, and functional movement proximal to distal sitting EOB.  Heavy weight bearing through right UE with functional reach with left UE. Transferred to w/c with min A to the left with max cues for setup. Left in w/c with call bell.  Therapy Documentation Precautions:  Precautions Precautions: Fall Precaution Comments: right hemiparesis, diplopia, decreased proprioception throughout the RUE and RLE Restrictions Weight Bearing Restrictions: No Pain:  no c/o pain    See FIM for current functional status  Therapy/Group: Individual Therapy  Willeen Cass [facility] 06/19/2014, 1:53 PM

## 2014-06-19 NOTE — Progress Notes (Signed)
Subjective/Complaints:  O2, placed at noc, had 2 voids with low PVR BM this am ROS- neg except weakness and RLE swelling Objective: Vital Signs: Blood pressure 141/94, pulse 94, temperature 97.5 F (36.4 C), temperature source Oral, resp. rate 16, height 5\' 8"  (1.727 m), weight 72.95 kg (160 lb 13.2 oz), SpO2 91 %. No results found. Results for orders placed or performed during the hospital encounter of 06/11/14 (from the past 72 hour(s))  Glucose, capillary     Status: Abnormal   Collection Time: 06/16/14 11:25 AM  Result Value Ref Range   Glucose-Capillary 261 (H) 70 - 99 mg/dL   Comment 1 Notify RN   Glucose, capillary     Status: Abnormal   Collection Time: 06/16/14  4:38 PM  Result Value Ref Range   Glucose-Capillary 198 (H) 70 - 99 mg/dL   Comment 1 Notify RN   Glucose, capillary     Status: Abnormal   Collection Time: 06/16/14  9:17 PM  Result Value Ref Range   Glucose-Capillary 199 (H) 70 - 99 mg/dL  Glucose, capillary     Status: Abnormal   Collection Time: 06/17/14  6:31 AM  Result Value Ref Range   Glucose-Capillary 193 (H) 70 - 99 mg/dL  Glucose, capillary     Status: Abnormal   Collection Time: 06/17/14 11:29 AM  Result Value Ref Range   Glucose-Capillary 223 (H) 70 - 99 mg/dL   Comment 1 Notify RN   Glucose, capillary     Status: Abnormal   Collection Time: 06/17/14  4:35 PM  Result Value Ref Range   Glucose-Capillary 244 (H) 70 - 99 mg/dL   Comment 1 Notify RN   Glucose, capillary     Status: Abnormal   Collection Time: 06/17/14  9:52 PM  Result Value Ref Range   Glucose-Capillary 209 (H) 70 - 99 mg/dL  Glucose, capillary     Status: Abnormal   Collection Time: 06/18/14  6:53 AM  Result Value Ref Range   Glucose-Capillary 191 (H) 70 - 99 mg/dL  Glucose, capillary     Status: Abnormal   Collection Time: 06/18/14 12:07 PM  Result Value Ref Range   Glucose-Capillary 216 (H) 70 - 99 mg/dL  Glucose, capillary     Status: Abnormal   Collection Time:  06/18/14  4:55 PM  Result Value Ref Range   Glucose-Capillary 217 (H) 70 - 99 mg/dL  Glucose, capillary     Status: Abnormal   Collection Time: 06/18/14  8:44 PM  Result Value Ref Range   Glucose-Capillary 204 (H) 70 - 99 mg/dL   Comment 1 Notify RN   Glucose, capillary     Status: Abnormal   Collection Time: 06/19/14  6:34 AM  Result Value Ref Range   Glucose-Capillary 194 (H) 70 - 99 mg/dL   Comment 1 Notify RN      HEENT: tape over lens  of eyeglass on RIght Cardio: RRR Resp: CTA B/L and unlabored GI: BS positive and NT, ND Extremity:  Pulses positive and 2+ edema R pretib, calf nontender, soft Skin:   Intact Neuro: Alert/Oriented, Abnormal Sensory reduced sensation RUE and RLE, Abnormal Motor 3- R delt , bi, tri, grip, HF, KN, ankle DF is trace and Other ataxia RUE Musc/Skel:  Edema RLE pretib, no erythema no tenderness , no pain with R Ankle ROM Gen NAD  Assessment/Plan: 1. Functional deficits secondary to Left thalamic ICH which require 3+ hours per day of interdisciplinary therapy in a comprehensive inpatient  rehab setting. Physiatrist is providing close team supervision and 24 hour management of active medical problems listed below. Physiatrist and rehab team continue to assess barriers to discharge/monitor patient progress toward functional and medical goals.  FIM: FIM - Bathing Bathing Steps Patient Completed: Chest, Abdomen, Right upper leg, Left upper leg, Right Arm, Left lower leg (including foot), Front perineal area Bathing: 3: Mod-Patient completes 5-7 26f 10 parts or 50-74%  FIM - Upper Body Dressing/Undressing Upper body dressing/undressing steps patient completed: Thread/unthread right sleeve of pullover shirt/dresss, Thread/unthread left sleeve of pullover shirt/dress, Put head through opening of pull over shirt/dress Upper body dressing/undressing: 4: Min-Patient completed 75 plus % of tasks FIM - Lower Body Dressing/Undressing Lower body dressing/undressing  steps patient completed: Thread/unthread left pants leg Lower body dressing/undressing: 2: Max-Patient completed 25-49% of tasks  FIM - Toileting Toileting steps completed by patient: Performs perineal hygiene Toileting: 2: Max-Patient completed 1 of 3 steps  FIM - Radio producer Devices: Bedside commode, Grab bars Toilet Transfers: 2-To toilet/BSC: Max A (lift and lower assist), 2-From toilet/BSC: Max A (lift and lower assist)  FIM - Engineer, site Assistive Devices: Arm rests Bed/Chair Transfer: 3: Sit > Supine: Mod A (lifting assist/Pt. 50-74%/lift 2 legs), 3: Chair or W/C > Bed: Mod A (lift or lower assist)  FIM - Locomotion: Wheelchair Distance: 100' Locomotion: Wheelchair: 2: Travels 50 - 149 ft with supervision, cueing or coaxing FIM - Locomotion: Ambulation Locomotion: Ambulation Assistive Devices: Other (comment) (3 musketeers) Ambulation/Gait Assistance: 1: +2 Total assist Locomotion: Ambulation: 0: Activity did not occur  Comprehension Comprehension Mode: Asleep Comprehension: 5-Understands complex 90% of the time/Cues < 10% of the time  Expression Expression Mode: Asleep Expression: 6-Expresses complex ideas: With extra time/assistive device  Social Interaction Social Interaction Mode: Asleep Social Interaction: 6-Interacts appropriately with others with medication or extra time (anti-anxiety, antidepressant).  Problem Solving Problem Solving Mode: Asleep Problem Solving: 4-Solves basic 75 - 89% of the time/requires cueing 10 - 24% of the time  Memory Memory Mode: Asleep Memory: 4-Recognizes or recalls 75 - 89% of the time/requires cueing 10 - 24% of the time  Medical Problem List and Plan: 1. Functional deficits secondary to Left Thalamic ICH 2. DVT Prophylaxis/Anticoagulation: SCDs.Monitor for signs of DVT 3. Pain Management: Tylenol as needed 4. HCAP. Encourage incentive spirometer. Vancomycin and Zosyn  discontinued and changed to Levaquin 06/11/2014. Check oxygen saturations every shift, starting to wean O2 5. Neuropsych: This patient is capable of making decisions on his own behalf. 6. Skin/Wound Care: Routine skin checks 7. Fluids/Electrolytes/Nutrition: Strict I and O's with follow-up chemistries 8. Mild ileus. Diet has been advanced to regular. No nausea vomiting. Establish bowel program 9. Diabetes mellitus and peripheral neuropathy. Latest hemoglobin A1c 7.8. Patient with history of insulin pump followed by Dr. Buddy Duty and endocrine services. Current sliding scale insulin 10. Coronary artery disease with balloon angioplasty. No chest pain or shortness of breath. 11. Urinary retention. Continue Flomax. Foley tube remain in place. Follow-up urology services outpatient 12.  Peripheral edema related to weakness RLE, TED hose LOS (Days) 8 A FACE TO FACE EVALUATION WAS PERFORMED  Kirsta Probert E 06/19/2014, 7:43 AM

## 2014-06-19 NOTE — Progress Notes (Addendum)
Large incontinent void, PVR=253cc's.Jeffrey [name]  At 0230-O2 sat 89% on RA, O2 applied at 1L/M via [place], recheck O2 sat=93%.Jeffrey [name]

## 2014-06-19 NOTE — Progress Notes (Signed)
Physical Therapy Session Note  Patient Details  Name: Jeffrey [name] MRN: [id number] Date of Birth: 1940/11/14  Today's Date: 06/19/2014 PT Individual Time: 1400-1500 PT Individual Time Calculation (min): 60 min   Short Term Goals: Week 1:  PT Short Term Goal 1 (Week 1): Pt will perform supine<>sit with mod A and 50% cueing with HOB elevated and rails.  PT Short Term Goal 2 (Week 1): Pt will perform bed<> w/c transfer with total A +1 and 75% cueing. PT Short Term Goal 3 (Week 1): Pt will ambulate of 20' with +2 A and 100% cueing.  PT Short Term Goal 4 (Week 1): Pt will propel w/c 50' in controlled environment with mod A and 50% cues.  PT Short Term Goal 5 (Week 1): Pt will perform dynamic sitting balance without UE support for >5 mins with supervision and 25% cueing.    Skilled Therapeutic Interventions/Progress Updates:  Pt received in w/c in room with wife present; agreeable to therapy.  Session focused on standing tolerance and pre-gait with Lite Gait.  Pt propelled w/c 40' with L hemi technique into hallway, and transported in w/c total A the remaining distance to the gym for energy conservation. Pt negotiated one step onto treadmill while attached to Lite Gait, with tactile and verbal cues for technique and sequencing.  Pt tolerated standing in Lite Gait for 3 mins, then 2 mins with pre-gait activity, requiring a seated rest break after each round.  SpO2 remained above 90% throughout the entire session, but HR climbed to 137 after activity.  Manual pulse rate showed irregular heart rate; RN and PA made aware and mentioned pt had PVCs with exercise.  Pt transported in w/c total A due to time constraints back to room.  Pt transported w/c>toilet max A per nurse's request, and was left on toilet with nurse for bowel program.       Therapy Documentation Precautions:  Precautions Precautions: Fall Precaution Comments: right hemiparesis, diplopia, decreased proprioception throughout the RUE and  RLE Restrictions Weight Bearing Restrictions: No Pain: Pain Assessment Pain Assessment: No/denies pain Locomotion : Ambulation Ambulation/Gait Assistance: 1: +2 Total assist Wheelchair Mobility Distance: 40   See FIM for current functional status  Therapy/Group: Individual Therapy  Marlyce Mcdougald 06/19/2014, 3:09 PM

## 2014-06-20 ENCOUNTER — Inpatient Hospital Stay (HOSPITAL_COMMUNITY): Payer: Medicare Other | Admitting: Physical Therapy

## 2014-06-20 ENCOUNTER — Inpatient Hospital Stay (HOSPITAL_COMMUNITY): Payer: Medicare Other | Admitting: Occupational Therapy

## 2014-06-20 ENCOUNTER — Inpatient Hospital Stay (HOSPITAL_COMMUNITY): Payer: Medicare Other

## 2014-06-20 ENCOUNTER — Inpatient Hospital Stay (HOSPITAL_COMMUNITY): Payer: Medicare Other | Admitting: Speech Pathology

## 2014-06-20 DIAGNOSIS — I629 Nontraumatic intracranial hemorrhage, unspecified: Secondary | ICD-10-CM

## 2014-06-20 DIAGNOSIS — E1165 Type 2 diabetes mellitus with hyperglycemia: Secondary | ICD-10-CM

## 2014-06-20 DIAGNOSIS — E1140 Type 2 diabetes mellitus with diabetic neuropathy, unspecified: Secondary | ICD-10-CM

## 2014-06-20 DIAGNOSIS — R208 Other disturbances of skin sensation: Secondary | ICD-10-CM

## 2014-06-20 DIAGNOSIS — I10 Essential (primary) hypertension: Secondary | ICD-10-CM

## 2014-06-20 DIAGNOSIS — I69898 Other sequelae of other cerebrovascular disease: Secondary | ICD-10-CM

## 2014-06-20 LAB — GLUCOSE, CAPILLARY
GLUCOSE-CAPILLARY: 239 mg/dL — AB (ref 70–99)
Glucose-Capillary: 203 mg/dL — ABNORMAL HIGH (ref 70–99)
Glucose-Capillary: 265 mg/dL — ABNORMAL HIGH (ref 70–99)
Glucose-Capillary: 204 mg/dL — ABNORMAL HIGH (ref 70–99)

## 2014-06-20 NOTE — Progress Notes (Signed)
Inpatient Diabetes Program Recommendations  AACE/ADA: New Consensus Statement on Inpatient Glycemic Control (2013)  Target Ranges:  Prepandial:   less than 140 mg/dL      Peak postprandial:   less than 180 mg/dL (1-2 hours)      Critically ill patients:  140 - 180 mg/dL   Consult received re pt/family concerns for elevated glucose levels while not on his insulin pump. Called Dr Cindra Eves office to request pump basal and bolus settings to better assess basal needs and meal coverage, correction Pt is not able to use pump safely while here in the hospital. However he and his wife are concerned about his elevated glucose levels. According to his weight of 161 lbs, (73 kg), would address first the basal/bolus needs based on 0.8 units/kg Which totals 58 units total daily insulin (basal and bolus)  Typically half this total is given as basal insulin  However, that would total almost 30 units lantus.  At this point, I would recommend an increase to 20 units per day or HS to start and add meal coverage of 3-4 units tidwc. Can continue correction as ordered-may want to lower to the sensitive correction tidwc and HS.  Inpatient Diabetes Program Recommendations Insulin - Basal: Please consider increase in lantus dose while here to 20 units (may need more-will follow) And add meal coverage of 3-4 units tidwc and lower correction to sensitive tidwc. Will follow patient and assist as requested/needed.  Thank you Rosita Kea, RN, MSN, CDE  Diabetes Inpatient Program Office: [phone] Pager: [phone] 8:00 am to 5:00 pm

## 2014-06-20 NOTE — Progress Notes (Signed)
Occupational Therapy Session Note  Patient Details  Name: Jeffrey [name] MRN: [id number] Date of Birth: 1940/06/24  Today's Date: 06/20/2014 OT Individual Time: 1300-1400 OT Individual Time Calculation (min): 60 min   Skilled Therapeutic Interventions/Progress Updates:    Pt worked on Brewing technologist for the RUE during session.  Began with weightbearing tasks while sitting EOM, with pt using the RUE to maintain balance while reaching for objects across his body.  He was able to complete the task with supervision but needs cues to maintain vision on the RUE.  Progressed to having pt place both UEs on board and therapist moved the board around into various levels of shoulder flexion, encouraging pt to maintain contact with the board at all times. Also focused on graded movement as he had to keep his right hand from falling off of the board but also allow the therapist to move the board toward and away from him, grading the amount of force he was applying to the board.  Mod facilitation needed to maintain contact with the board.  Next progressed to using a tilted stool for active movement as well with focus on slow controlled movement.  Pt still with ataxic movement in the UE with functional use.  Finished session by educating pt to work on self AAROM shoulder flexion as well as AROM elbow flexion/extension while in his room, with emphasis on slow controlled movements.   Therapy Documentation Precautions:  Precautions Precautions: Fall Precaution Comments: right hemiparesis, diplopia, decreased proprioception throughout the RUE and RLE Restrictions Weight Bearing Restrictions: No  Pain: Pain Assessment Pain Assessment: No/denies pain ADL: See FIM for current functional status  Therapy/Group: Individual Therapy  Weldon Nouri OTR/L 06/20/2014, 4:06 PM

## 2014-06-20 NOTE — Plan of Care (Signed)
Problem: RH Bed Mobility Goal: LTG Patient will perform bed mobility with assist (PT) LTG: Patient will perform bed mobility with assistance, with/without cues (PT).  Goal downgraded secondary to lack of pt progress.  Problem: RH Car Transfers Goal: LTG Patient will perform car transfers with assist (PT) LTG: Patient will perform car transfers with assistance (PT).  Outcome: Not Applicable Date Met:  42/90/37 Goal discharged secondary to change in D/C plan to nursing home placement.  Problem: RH Furniture Transfers Goal: LTG Patient will perform furniture transfers w/assist (OT/PT LTG: Patient will perform furniture transfers with assistance (OT/PT).  Outcome: Not Applicable Date Met:  95/58/31 Goal discharged secondary to change in D/C plan to nursing home placement.  Problem: RH Ambulation Goal: LTG Patient will ambulate in controlled environment (PT) LTG: Patient will ambulate in a controlled environment, # of feet with assistance (PT).  Goal downgraded secondary to lack of pt progress. Goal: LTG Patient will ambulate in home environment (PT) LTG: Patient will ambulate in home environment, # of feet with assistance (PT).  Outcome: Not Applicable Date Met:  67/42/55 Goal discharged secondary to change in D/C plan to nursing home placement.  Problem: RH Wheelchair Mobility Goal: LTG Patient will propel w/c in home environment (PT) LTG: Patient will propel wheelchair in home environment, # of feet with assistance (PT).  Outcome: Not Applicable Date Met:  25/89/48 Goal discharged secondary to change in D/C plan to nursing home placement. Goal: LTG Patient will propel w/c in community environment (PT) LTG: Patient will propel wheelchair in community environment, # of feet with assist (PT)  Outcome: Not Applicable Date Met:  34/75/83 Goal discharged secondary to change in D/C plan to nursing home placement.  Problem: RH Stairs Goal: LTG Patient will ambulate up and down stairs  w/assist (PT) LTG: Patient will ambulate up and down # of stairs with assistance (PT)  Outcome: Not Applicable Date Met:  07/46/00 Goal discharged secondary to change in D/C plan to nursing home placement.

## 2014-06-20 NOTE — Progress Notes (Signed)
Physical Therapy Weekly Progress Note  Patient Details  Name: Jeffrey [name] MRN: [id number] Date of Birth: 1940-05-30  Beginning of progress report period: June 12, 2014 End of progress report period: June 20, 2014  Today's Date: 06/20/2014 PT Individual Time: 6237-6283 PT Individual Time Calculation (min): 55 min   Patient has met 4 of 5 short term goals.  Pt has demonstrated increased stability/independence with functional transfers, dynamic sitting balance, and w/c mobility. Pt currently requires supervision for dynamic sitting balance and w/c mobility and mod A for functional transfers.  Patient continues to demonstrate the following deficits: muscle weakness, decreased cardiorespiratoy endurance, impaired timing and sequencing, abnormal tone, unbalanced muscle activation, motor apraxia and decreased coordination, diplopia, decreased midline orientation and decreased attention to right, decreased attention, decreased problem solving and decreased memory and decreased sitting balance, decreased standing balance, decreased postural control, hemiplegia and decreased balance strategies and therefore will continue to benefit from skilled PT intervention to enhance overall performance with activity tolerance, balance, postural control, ability to compensate for deficits, functional use of  right upper extremity and right lower extremity, attention and coordination.  Patient is making slow progress toward long term goals; however, due to recent change in D/C plan to nursing home placement, goals addressing the following have been discharged: car/furniture transfers, gait and w/c in both home and community environments. Remaining goals have been downgraded to Mod A bed mobility, Min A basic transfers, Total A ambulation secondary to slow pt progress.  PT Short Term Goals Week 1:  PT Short Term Goal 1 (Week 1): Pt will perform supine<>sit with mod A and 50% cueing with HOB elevated and rails.  PT  Short Term Goal 1 - Progress (Week 1): Progressing toward goal (Pt currently Max A with HOB flat and no rails) PT Short Term Goal 2 (Week 1): Pt will perform bed<> w/c transfer with total A +1 and 75% cueing. PT Short Term Goal 2 - Progress (Week 1): Met PT Short Term Goal 3 (Week 1): Pt will ambulate of 20' with +2 A and 100% cueing.  PT Short Term Goal 3 - Progress (Week 1): Met PT Short Term Goal 4 (Week 1): Pt will propel w/c 50' in controlled environment with mod A and 50% cues.  PT Short Term Goal 4 - Progress (Week 1): Met PT Short Term Goal 5 (Week 1): Pt will perform dynamic sitting balance without UE support for >5 mins with supervision and 25% cueing.   PT Short Term Goal 5 - Progress (Week 1): Met Week 2:  PT Short Term Goal 1 (Week 2): STG's = LTG's secondary to anticipated LOS, planned D/C to SNF.  Skilled Therapeutic Interventions/Progress Updates:    Pt received seated in w/c having just completed OT session. Pt's HR and SpO2 WNL at baseline with pt on RA; however, noted increased coughing and pt-reported pain in L ribcage. RN made aware. Pt performed w/c mobility x135' in controlled environment with L hemi technique and supervision, cueing for obstacle negotiation on R side. In gym, pt performed level squat pivot transfer from w/c <> mat table with mod A and >75% cueing for setup, sequencing, and technique. Pt performed supine <> sit on mat table with max A, max multimodal cueing for attention to RUE/RLE. Seated EOM without UE/LE support, pt performed throwing/catching of slow ball x5 consecutive minutes to increase trunk control, activity tolerance; pt required SBA and exhibited no significant LOB. Session ended in pt room, where pt was left seated in  w/c in no apparent distress with all needs within reach. SpO2 remained >91% during this session with pt on RA; frequent prolonged rest breaks required to maintain HR </= 80% of age-predicted max HR.  Therapy Documentation Precautions:   Precautions Precautions: Fall Precaution Comments: right hemiparesis, diplopia, decreased proprioception throughout the RUE and RLE Restrictions Weight Bearing Restrictions: No Vital Signs: Therapy Vitals Pulse Rate: (!) 122 (RN made aware) Patient Position (if appropriate): Sitting (Resting) Oxygen Therapy SpO2: 92 % O2 Device: Not Delivered Pain: Pain Assessment Pain Assessment: 0-10 Pain Score: 3  Pain Location: Rib cage Pain Orientation: Left Pain Descriptors / Indicators: Discomfort Pain Onset: Other (Comment) (with coughing and talking) Pain Intervention(s): RN made aware Locomotion : Wheelchair Mobility Distance: 135   See FIM for current functional status  Therapy/Group: Individual Therapy  Stefano Gaul 06/20/2014, 7:38 PM

## 2014-06-20 NOTE — Progress Notes (Signed)
Occupational Therapy Session Note  Patient Details  Name: Jeffrey [name] MRN: [id number] Date of Birth: [birth year]  Today's Date: 06/20/2014 OT Individual Time:  -   0820-0930   (70 min)      Short Term Goals: Week 1:  OT Short Term Goal 1 (Week 1): Pt will peform UB bathing with supervision sitting unsupported. OT Short Term Goal 1 - Progress (Week 1): Not met OT Short Term Goal 2 (Week 1): Pt will complete LB bathing sit to stand with mod assist for 2 consecutive sessions. OT Short Term Goal 2 - Progress (Week 1): Not met OT Short Term Goal 3 (Week 1): Pt will perform LB dressing with mod assist 2 consecutive sessions. OT Short Term Goal 3 - Progress (Week 1): Not met OT Short Term Goal 4 (Week 1): Pt will demonstrate increased awareness of the RUE by placing it in his lap prior to transfers, and LB selfcare with no more than mod instructional cueing.  OT Short Term Goal 4 - Progress (Week 1): Not met OT Short Term Goal 5 (Week 1): Pt will perform toilet transfer with max assist stand pivot.  OT Short Term Goal 5 - Progress (Week 1): Met Week 2:  OT Short Term Goal 1 (Week 2): Pt will peform UB bathing with supervision sitting unsupported. OT Short Term Goal 2 (Week 2): Pt will complete LB bathing sit to stand with mod assist for 2 consecutive sessions. OT Short Term Goal 3 (Week 2): Pt will perform LB dressing with mod assist 2 consecutive sessions. OT Short Term Goal 4 (Week 2): Pt will demonstrate increased awareness of the RUE by placing it in his lap prior to transfers, and LB selfcare with no more than mod instructional cueing.  OT Short Term Goal 5 (Week 2): Pt will perform stand pivot transfer to the 3:1 with mod assist 2 consecutive sessions.   Week 3:     Skilled Therapeutic Interventions/Progress Updates:    Focus of treatment was bed mobility, transfers,  Neuro-muscular reeducation, sitting balance, standing balance, attention, therapeutic activities, sustained attention,  postural control    Pt. Went from supine to EOB with moderate assist.  Pt. With coughing throughout session.  Pt reported  He did not know why he was coughing so much.  OT educated pt on closing mouth and breathing through nose to decrease coughing episode.  Pt. Performed stand pivot transfer to wc>3n1 commode chair>wc>shower chair with mod to max  assist and facilitation of right hand and foot movement and positioning.  Pt. Had BM while on toilet.  After transferring to shower pt assisted with sit to stand x3 for peri care with mod assist and stood for 1 minute with facilitation of stability through pelvis and right knee and foot.  RUE has extensor tone and demonstrated increased internal rotation, extension at shoulder, elbow, and flexion at wrist and hand.  Pt. Completed dressing at sink with 3 more sit to stand and standing balance activities.  Continued to cue to close mouth, cover mouth when coughing which decreased with covered mouth and activity.        Therapy Documentation Precautions:  Precautions Precautions: Fall Precaution Comments: right hemiparesis, diplopia, decreased proprioception throughout the RUE and RLE Restrictions Weight Bearing Restrictions: No      Pain:  None; just frequent coughing during session            See FIM for current functional status  Therapy/Group: Individual Therapy  Lisa Roca 06/20/2014,  8:18 AM

## 2014-06-20 NOTE — Progress Notes (Signed)
Speech Language Pathology Daily Session Note  Patient Details  Name: MATILDE MARKIE MRN: [id number] Date of Birth: May 21, 1940  Today's Date: 06/20/2014 SLP Individual Time: 1405-1430 SLP Individual Time Calculation (min): 25 min  Short Term Goals: Week 2: SLP Short Term Goal 1 (Week 2): Pt willl utilize compensatory aids to facilitate recall of new information during semi-complex tasks with min assist verbal cues  SLP Short Term Goal 2 (Week 2): Pt will recognize and correct errors in the moment when completing semi-complex home management tasks with min assist verbal cues.   SLP Short Term Goal 3 (Week 2): Pt will return demonstration of 2 safety precautions during structured and unstructured therapeutic tasks with supervision cues.   SLP Short Term Goal 4 (Week 2): Pt will utilize word finding strategies in functional conversations to convey basic to semi-complex needs/wants with min assist verbal cues.   Skilled Therapeutic Interventions:  Pt was seen for skilled ST targeting goals for communication. Upon arrival, pt was seated upright in wheelchair, awake, alert, and agreeable to participate in ST.  SLP facilitated the session with a structured barrier activity targeting use of description as a word finding strategy.  Pt described basic nouns with min-mod assist question cues for thoroughness.  Pt also benefited from intermittent mod assist semantic cues to convey abstract/complex ideas.   Of note, pt reported pain in rib cage from persistent, dry cough over the last several days.  Pain did not limit him from participating in therapies although pt was noted to become more fatigued by the end of the session.  RN made aware.  Continue per current plan of care.    FIM:  Comprehension Comprehension Mode: Auditory Comprehension: 5-Understands complex 90% of the time/Cues < 10% of the time Expression Expression Mode: Verbal Expression: 4-Expresses basic 75 - 89% of the time/requires cueing 10  - 24% of the time. Needs helper to occlude trach/needs to repeat words. Social Interaction Social Interaction: 6-Interacts appropriately with others with medication or extra time (anti-anxiety, antidepressant). Problem Solving Problem Solving: 4-Solves basic 75 - 89% of the time/requires cueing 10 - 24% of the time Memory Memory: 4-Recognizes or recalls 75 - 89% of the time/requires cueing 10 - 24% of the time  Pain Pain Assessment Pain Assessment: 0-10 Pain Score: 3  Pain Location: Rib cage Pain Orientation: Left Pain Descriptors / Indicators: Discomfort Pain Intervention(s): RN made aware  Therapy/Group: Individual Therapy  Martine Trageser, Selinda Orion 06/20/2014, 5:20 PM

## 2014-06-20 NOTE — Progress Notes (Signed)
Social Work Patient ID: Jeffrey [name], male   DOB: [birth year], 74 y.o.   MRN: [id number] [facility]oke with [facility] has offered a bed on Wed.  Have informed pt, wife and son-via telephone. All are in agreement and will work toward [employer], will work on Biochemist, clinical.

## 2014-06-20 NOTE — Progress Notes (Signed)
Social Work Patient ID: Jeffrey [name], male   DOB: 08-22-1940, 74 y.o.   MRN: [id number] Spoke with [employer] who informed worker since he is going to be going to a NH they will only allow him to stay until next Wed 4/20. Unless something medical happens and he is not medically stable for transfer.  Have met with pt and wife to inform them of this and they understand. They prefer to go to [facility] where wife was but was discharged today.  Pt is having blood sugar issues today-diabetic RN was in to see him. Will see on Monday and work on getting a bed at [facility]

## 2014-06-20 NOTE — Progress Notes (Signed)
Subjective/Complaints: No O2 this am, had to be cathed last noc Used only insulin pump at home for diabetic management ROS- neg except weakness and RLE swelling Objective: Vital Signs: Blood pressure 129/49, pulse 91, temperature 97.9 F (36.6 C), temperature source Oral, resp. rate 18, height 5\' 8"  (1.727 m), weight 73.2 kg (161 lb 6 oz), SpO2 98 %. No results found. Results for orders placed or performed during the hospital encounter of 06/11/14 (from the past 72 hour(s))  Glucose, capillary     Status: Abnormal   Collection Time: 06/17/14 11:29 AM  Result Value Ref Range   Glucose-Capillary 223 (H) 70 - 99 mg/dL   Comment 1 Notify RN   Glucose, capillary     Status: Abnormal   Collection Time: 06/17/14  4:35 PM  Result Value Ref Range   Glucose-Capillary 244 (H) 70 - 99 mg/dL   Comment 1 Notify RN   Glucose, capillary     Status: Abnormal   Collection Time: 06/17/14  9:52 PM  Result Value Ref Range   Glucose-Capillary 209 (H) 70 - 99 mg/dL  Glucose, capillary     Status: Abnormal   Collection Time: 06/18/14  6:53 AM  Result Value Ref Range   Glucose-Capillary 191 (H) 70 - 99 mg/dL  Glucose, capillary     Status: Abnormal   Collection Time: 06/18/14 12:07 PM  Result Value Ref Range   Glucose-Capillary 216 (H) 70 - 99 mg/dL  Glucose, capillary     Status: Abnormal   Collection Time: 06/18/14  4:55 PM  Result Value Ref Range   Glucose-Capillary 217 (H) 70 - 99 mg/dL  Glucose, capillary     Status: Abnormal   Collection Time: 06/18/14  8:44 PM  Result Value Ref Range   Glucose-Capillary 204 (H) 70 - 99 mg/dL   Comment 1 Notify RN   Glucose, capillary     Status: Abnormal   Collection Time: 06/19/14  6:34 AM  Result Value Ref Range   Glucose-Capillary 194 (H) 70 - 99 mg/dL   Comment 1 Notify RN   Glucose, capillary     Status: Abnormal   Collection Time: 06/19/14 11:55 AM  Result Value Ref Range   Glucose-Capillary 253 (H) 70 - 99 mg/dL  Glucose, capillary      Status: Abnormal   Collection Time: 06/19/14  4:54 PM  Result Value Ref Range   Glucose-Capillary 187 (H) 70 - 99 mg/dL  Glucose, capillary     Status: Abnormal   Collection Time: 06/19/14  9:08 PM  Result Value Ref Range   Glucose-Capillary 213 (H) 70 - 99 mg/dL  Glucose, capillary     Status: Abnormal   Collection Time: 06/20/14  6:54 AM  Result Value Ref Range   Glucose-Capillary 203 (H) 70 - 99 mg/dL     HEENT: tape over lens  of eyeglass on RIght Cardio: RRR Resp: CTA B/L and unlabored GI: BS positive and NT, ND Extremity:  Pulses positive and 2+ edema R pretib, calf nontender, soft Skin:   Intact Neuro: Alert/Oriented, Abnormal Sensory reduced sensation RUE and RLE, Abnormal Motor 3- R delt , bi, tri, grip, HF, KN, ankle DF is trace and Other ataxia RUE Musc/Skel:  Edema RLE pretib, no erythema no tenderness , no pain with R Ankle ROM Gen NAD  Assessment/Plan: 1. Functional deficits secondary to Left thalamic ICH which require 3+ hours per day of interdisciplinary therapy in a comprehensive inpatient rehab setting. Physiatrist is providing close team supervision and  24 hour management of active medical problems listed below. Physiatrist and rehab team continue to assess barriers to discharge/monitor patient progress toward functional and medical goals.  FIM: FIM - Bathing Bathing Steps Patient Completed: Chest, Abdomen, Right upper leg, Left upper leg, Right Arm, Left lower leg (including foot), Front perineal area Bathing: 3: Mod-Patient completes 5-7 38f 10 parts or 50-74%  FIM - Upper Body Dressing/Undressing Upper body dressing/undressing steps patient completed: Thread/unthread right sleeve of pullover shirt/dresss, Thread/unthread left sleeve of pullover shirt/dress, Put head through opening of pull over shirt/dress, Pull shirt over trunk Upper body dressing/undressing: 5: Supervision: Safety issues/verbal cues FIM - Lower Body Dressing/Undressing Lower body  dressing/undressing steps patient completed: Thread/unthread left pants leg, Thread/unthread right underwear leg, Thread/unthread left underwear leg, Thread/unthread right pants leg Lower body dressing/undressing: 2: Max-Patient completed 25-49% of tasks  FIM - Toileting Toileting steps completed by patient: Performs perineal hygiene Toileting: 2: Max-Patient completed 1 of 3 steps  FIM - Radio producer Devices: Bedside commode, Grab bars Toilet Transfers: 3-From toilet/BSC: Mod A (lift or lower assist), 3-To toilet/BSC: Mod A (lift or lower assist)  FIM - Control and instrumentation engineer Devices: Arm rests Bed/Chair Transfer: 0: Activity did not occur  FIM - Locomotion: Wheelchair Distance: 40 Locomotion: Wheelchair: 1: Travels less than 50 ft with supervision, cueing or coaxing FIM - Locomotion: Ambulation Locomotion: Ambulation Assistive Devices: Lite Gait Ambulation/Gait Assistance: 1: +2 Total assist Locomotion: Ambulation: 1: Two helpers  Comprehension Comprehension Mode: Auditory Comprehension: 5-Understands complex 90% of the time/Cues < 10% of the time  Expression Expression Mode: Verbal Expression: 5-Expresses basic needs/ideas: With extra time/assistive device  Social Interaction Social Interaction Mode: Asleep Social Interaction: 6-Interacts appropriately with others with medication or extra time (anti-anxiety, antidepressant).  Problem Solving Problem Solving Mode: Asleep Problem Solving: 4-Solves basic 75 - 89% of the time/requires cueing 10 - 24% of the time  Memory Memory Mode: Asleep Memory: 4-Recognizes or recalls 75 - 89% of the time/requires cueing 10 - 24% of the time  Medical Problem List and Plan: 1. Functional deficits secondary to Left Thalamic ICH 2. DVT Prophylaxis/Anticoagulation: SCDs.Monitor for signs of DVT 3. Pain Management: Tylenol as needed 4. HCAP. Encourage incentive spirometer. Vancomycin  and Zosyn discontinued and changed to Levaquin 06/11/2014. Check oxygen saturations every shift, starting to wean O2 5. Neuropsych: This patient is capable of making decisions on his own behalf. 6. Skin/Wound Care: Routine skin checks 7. Fluids/Electrolytes/Nutrition: Strict I and O's with follow-up chemistries 8. Mild ileus. Diet has been advanced to regular. No nausea vomiting. Establish bowel program 9. Diabetes mellitus and peripheral neuropathy. Latest hemoglobin A1c 7.8. Patient with history of insulin pump followed by Dr. Buddy Duty and endocrine services. Current sliding scale insulin, ask diabetic coordinator to eval 10. Coronary artery disease with balloon angioplasty. No chest pain or shortness of breath. 11. Urinary retention. Continue Flomax. Foley tube remain in place. Follow-up urology services outpatient 12.  Peripheral edema related to weakness RLE, TED hose LOS (Days) 9 A FACE TO FACE EVALUATION WAS PERFORMED  KIRSTEINS,ANDREW E 06/20/2014, 7:53 AM

## 2014-06-21 ENCOUNTER — Inpatient Hospital Stay (HOSPITAL_COMMUNITY): Payer: Medicare Other | Admitting: Occupational Therapy

## 2014-06-21 LAB — GLUCOSE, CAPILLARY
GLUCOSE-CAPILLARY: 219 mg/dL — AB (ref 70–99)
GLUCOSE-CAPILLARY: 222 mg/dL — AB (ref 70–99)
Glucose-Capillary: 266 mg/dL — ABNORMAL HIGH (ref 70–99)
Glucose-Capillary: 268 mg/dL — ABNORMAL HIGH (ref 70–99)

## 2014-06-21 NOTE — Progress Notes (Signed)
Subjective/Complaints: sats in the high 80's this am. On oxygen 2l. sats increase with deep breating. Denies cough, sob, cp ROS- neg except weakness and RLE swelling Objective: Vital Signs: Blood pressure 143/83, pulse 106, temperature 98.7 F (37.1 C), temperature source Oral, resp. rate 19, height 5\' 8"  (1.727 m), weight 73.2 kg (161 lb 6 oz), SpO2 90 %. Dg Chest 2 View  06/20/2014   CLINICAL DATA:  Cough.  Pain in the center of the chest.  EXAM: CHEST  2 VIEW  COMPARISON:  06/07/2014  FINDINGS: No cardiomegaly. Stable mild upper mediastinal widening without aortic enlargement on chest CT 06/09/2014.  Lung opacities in the posterior costophrenic sulci persist, with low lung volumes. No edema, definitive effusion, or pneumothorax.  IMPRESSION: Persistent posterior lower lobe atelectasis or pneumonia. No change to explain new chest pain.   Electronically Signed   By: Monte Fantasia M.D.   On: 06/20/2014 22:24   Results for orders placed or performed during the hospital encounter of 06/11/14 (from the past 72 hour(s))  Glucose, capillary     Status: Abnormal   Collection Time: 06/18/14 12:07 PM  Result Value Ref Range   Glucose-Capillary 216 (H) 70 - 99 mg/dL  Glucose, capillary     Status: Abnormal   Collection Time: 06/18/14  4:55 PM  Result Value Ref Range   Glucose-Capillary 217 (H) 70 - 99 mg/dL  Glucose, capillary     Status: Abnormal   Collection Time: 06/18/14  8:44 PM  Result Value Ref Range   Glucose-Capillary 204 (H) 70 - 99 mg/dL   Comment 1 Notify RN   Glucose, capillary     Status: Abnormal   Collection Time: 06/19/14  6:34 AM  Result Value Ref Range   Glucose-Capillary 194 (H) 70 - 99 mg/dL   Comment 1 Notify RN   Glucose, capillary     Status: Abnormal   Collection Time: 06/19/14 11:55 AM  Result Value Ref Range   Glucose-Capillary 253 (H) 70 - 99 mg/dL  Glucose, capillary     Status: Abnormal   Collection Time: 06/19/14  4:54 PM  Result Value Ref Range   Glucose-Capillary 187 (H) 70 - 99 mg/dL  Glucose, capillary     Status: Abnormal   Collection Time: 06/19/14  9:08 PM  Result Value Ref Range   Glucose-Capillary 213 (H) 70 - 99 mg/dL  Glucose, capillary     Status: Abnormal   Collection Time: 06/20/14  6:54 AM  Result Value Ref Range   Glucose-Capillary 203 (H) 70 - 99 mg/dL  Glucose, capillary     Status: Abnormal   Collection Time: 06/20/14 11:30 AM  Result Value Ref Range   Glucose-Capillary 239 (H) 70 - 99 mg/dL  Glucose, capillary     Status: Abnormal   Collection Time: 06/20/14  4:19 PM  Result Value Ref Range   Glucose-Capillary 265 (H) 70 - 99 mg/dL   Comment 1 Notify RN   Glucose, capillary     Status: Abnormal   Collection Time: 06/20/14  9:00 PM  Result Value Ref Range   Glucose-Capillary 204 (H) 70 - 99 mg/dL  Glucose, capillary     Status: Abnormal   Collection Time: 06/21/14  7:07 AM  Result Value Ref Range   Glucose-Capillary 219 (H) 70 - 99 mg/dL     HEENT: tape over lens  of eyeglass on RIght Cardio: RRR Resp: CTA B/L and unlabored GI: BS positive and NT, ND Extremity:  Pulses positive and 2+  edema R pretib, calf nontender, soft Skin:   Intact Neuro: Alert/Oriented, Abnormal Sensory reduced sensation RUE and RLE, Abnormal Motor 3- R delt , bi, tri, grip, HF, KN, ankle DF is trace and Other ataxia RUE Musc/Skel:  Edema RLE pretib, no erythema no tenderness , no pain with R Ankle ROM Gen NAD  Assessment/Plan: 1. Functional deficits secondary to Left thalamic ICH which require 3+ hours per day of interdisciplinary therapy in a comprehensive inpatient rehab setting. Physiatrist is providing close team supervision and 24 hour management of active medical problems listed below. Physiatrist and rehab team continue to assess barriers to discharge/monitor patient progress toward functional and medical goals.  FIM: FIM - Bathing Bathing Steps Patient Completed: Chest, Abdomen, Right upper leg, Left upper leg,  Right Arm, Left lower leg (including foot), Front perineal area Bathing: 3: Mod-Patient completes 5-7 1f 10 parts or 50-74%  FIM - Upper Body Dressing/Undressing Upper body dressing/undressing steps patient completed: Thread/unthread right sleeve of pullover shirt/dresss, Thread/unthread left sleeve of pullover shirt/dress, Put head through opening of pull over shirt/dress, Pull shirt over trunk Upper body dressing/undressing: 5: Supervision: Safety issues/verbal cues FIM - Lower Body Dressing/Undressing Lower body dressing/undressing steps patient completed: Thread/unthread left pants leg, Thread/unthread right underwear leg, Thread/unthread left underwear leg, Thread/unthread right pants leg Lower body dressing/undressing: 2: Max-Patient completed 25-49% of tasks  FIM - Toileting Toileting steps completed by patient: Performs perineal hygiene Toileting: 2: Max-Patient completed 1 of 3 steps  FIM - Radio producer Devices: Bedside commode, Grab bars Toilet Transfers: 3-From toilet/BSC: Mod A (lift or lower assist), 3-To toilet/BSC: Mod A (lift or lower assist)  FIM - Control and instrumentation engineer Devices: Arm rests Bed/Chair Transfer: 3: Chair or W/C > Bed: Mod A (lift or lower assist), 2: Supine > Sit: Max A (lifting assist/Pt. 25-49%), 2: Sit > Supine: Max A (lifting assist/Pt. 25-49%), 3: Bed > Chair or W/C: Mod A (lift or lower assist)  FIM - Locomotion: Wheelchair Distance: 135 Locomotion: Wheelchair: 2: Travels 50 - 149 ft with supervision, cueing or coaxing FIM - Locomotion: Ambulation Locomotion: Ambulation Assistive Devices: Lite Gait Ambulation/Gait Assistance: 1: +2 Total assist Locomotion: Ambulation: 0: Activity did not occur  Comprehension Comprehension Mode: Auditory Comprehension: 5-Understands complex 90% of the time/Cues < 10% of the time  Expression Expression Mode: Verbal Expression: 4-Expresses basic 75 - 89% of  the time/requires cueing 10 - 24% of the time. Needs helper to occlude trach/needs to repeat words.  Social Interaction Social Interaction Mode: Asleep Social Interaction: 6-Interacts appropriately with others with medication or extra time (anti-anxiety, antidepressant).  Problem Solving Problem Solving Mode: Asleep Problem Solving: 4-Solves basic 75 - 89% of the time/requires cueing 10 - 24% of the time  Memory Memory Mode: Asleep Memory: 4-Recognizes or recalls 75 - 89% of the time/requires cueing 10 - 24% of the time  Medical Problem List and Plan: 1. Functional deficits secondary to Left Thalamic ICH 2. DVT Prophylaxis/Anticoagulation: SCDs.Monitor for signs of DVT 3. Pain Management: Tylenol as needed 4. HCAP. Encouraged incentive spirometer. abx completed. Check oxygen saturations every shift,  wean O2 as able---still requiring however 5. Neuropsych: This patient is capable of making decisions on his own behalf. 6. Skin/Wound Care: Routine skin checks 7. Fluids/Electrolytes/Nutrition: Strict I and O's with follow-up chemistries 8. Mild ileus. Diet has been advanced to regular. No nausea vomiting.   bowel program 9. Diabetes mellitus and peripheral neuropathy. Latest hemoglobin A1c 7.8. Patient with history of insulin  pump followed by Dr. Buddy Duty and endocrine services. Current sliding scale insulin, ask diabetic coordinator to eval 10. Coronary artery disease with balloon angioplasty. No chest pain or shortness of breath.  11. Urinary retention. Continue Flomax. Foley tube remains in place. Follow-up urology services outpatient 12.  Peripheral edema related to weakness RLE, TED hose LOS (Days) 10 A FACE TO FACE EVALUATION WAS PERFORMED  Ardean Melroy T 06/21/2014, 9:09 AM

## 2014-06-21 NOTE — Progress Notes (Signed)
Occupational Therapy Session Note  Patient Details  Name: Jeffrey [name] MRN: [id number] Date of Birth: 09-13-1940  Today's Date: 06/21/2014 OT Individual Time: 6389-3734 OT Individual Time Calculation (min): 45 min    Short Term Goals: Week 1:  OT Short Term Goal 1 (Week 1): Pt will peform UB bathing with supervision sitting unsupported. OT Short Term Goal 1 - Progress (Week 1): Not met OT Short Term Goal 2 (Week 1): Pt will complete LB bathing sit to stand with mod assist for 2 consecutive sessions. OT Short Term Goal 2 - Progress (Week 1): Not met OT Short Term Goal 3 (Week 1): Pt will perform LB dressing with mod assist 2 consecutive sessions. OT Short Term Goal 3 - Progress (Week 1): Not met OT Short Term Goal 4 (Week 1): Pt will demonstrate increased awareness of the RUE by placing it in his lap prior to transfers, and LB selfcare with no more than mod instructional cueing.  OT Short Term Goal 4 - Progress (Week 1): Not met OT Short Term Goal 5 (Week 1): Pt will perform toilet transfer with max assist stand pivot.  OT Short Term Goal 5 - Progress (Week 1): Met  Skilled Therapeutic Interventions/Progress Updates:  Upon entering the room, pt seated in recliner chair with wife present in room. Wife seated in wheelchair and was walking on cell phone and propelling in hallway with wheelchair during session. Pt with no c/o pain this session. NT reports pt almost sliding out of chair this morning. OT and pt discussed situation and came up with alternative ways to ensure that did not occur again in order to decrease fall risk. Pt unable to remember self ROM for shoulder flexion from previous OT session. OT educated and pt demonstrated exercises x 10 reps. OT performed PROM for shoulder flexion while checking for scapular movement. Patients PROM for sholder flexion is limited by pain ~ 110 degrees. OT educated pt on [facility] exercises with R UE for functional tasks as well as leisure type activities.  Pt engaged in performing palmar translation task with cards in R hand as well as flipping cards over from stack. Pt having difficulty with tasks and increased frustration tolerance. OT also educated pt on pressure relief when sitting in wheelchair as pt reports bottom is sore when sitting in chair for long periods of time. Pt attempted chair push ups with therapist holding R hand for weight bearing and blocking R knee to keep from sliding. Pt able to do 1 and then multiple attempts after were not successful. Pt remained seated in recliner chair with call bell and all other items within reach.   Therapy Documentation Precautions:  Precautions Precautions: Fall Precaution Comments: right hemiparesis, diplopia, decreased proprioception throughout the RUE and RLE Restrictions Weight Bearing Restrictions: No Vital Signs: Therapy Vitals Temp: 97.8 F (36.6 C) Temp Source: Oral Pulse Rate: 97 Resp: 20 BP: 121/74 mmHg Patient Position (if appropriate): Sitting Oxygen Therapy SpO2: 92 % O2 Device: Nasal Cannula O2 Flow Rate (L/min): 2 L/min  See FIM for current functional status  Therapy/Group: Individual Therapy  Phineas Semen 06/21/2014, 5:26 PM

## 2014-06-22 ENCOUNTER — Inpatient Hospital Stay (HOSPITAL_COMMUNITY): Payer: Medicare Other | Admitting: Occupational Therapy

## 2014-06-22 LAB — GLUCOSE, CAPILLARY
GLUCOSE-CAPILLARY: 202 mg/dL — AB (ref 70–99)
GLUCOSE-CAPILLARY: 223 mg/dL — AB (ref 70–99)
Glucose-Capillary: 213 mg/dL — ABNORMAL HIGH (ref 70–99)
Glucose-Capillary: 192 mg/dL — ABNORMAL HIGH (ref 70–99)

## 2014-06-22 NOTE — Progress Notes (Signed)
Subjective/Complaints: Feeling well this morning. Concerned about dc date---doesn't appear aware of placement plan for Wednesday.  ROS- neg except weakness and RLE swelling Objective: Vital Signs: Blood pressure 131/76, pulse 85, temperature 97.9 F (36.6 C), temperature source Oral, resp. rate 18, height 5\' 8"  (1.727 m), weight 76.9 kg (169 lb 8.5 oz), SpO2 93 %. Dg Chest 2 View  06/20/2014   CLINICAL DATA:  Cough.  Pain in the center of the chest.  EXAM: CHEST  2 VIEW  COMPARISON:  06/07/2014  FINDINGS: No cardiomegaly. Stable mild upper mediastinal widening without aortic enlargement on chest CT 06/09/2014.  Lung opacities in the posterior costophrenic sulci persist, with low lung volumes. No edema, definitive effusion, or pneumothorax.  IMPRESSION: Persistent posterior lower lobe atelectasis or pneumonia. No change to explain new chest pain.   Electronically Signed   By: Monte Fantasia M.D.   On: 06/20/2014 22:24   Results for orders placed or performed during the hospital encounter of 06/11/14 (from the past 72 hour(s))  Glucose, capillary     Status: Abnormal   Collection Time: 06/19/14 11:55 AM  Result Value Ref Range   Glucose-Capillary 253 (H) 70 - 99 mg/dL  Glucose, capillary     Status: Abnormal   Collection Time: 06/19/14  4:54 PM  Result Value Ref Range   Glucose-Capillary 187 (H) 70 - 99 mg/dL  Glucose, capillary     Status: Abnormal   Collection Time: 06/19/14  9:08 PM  Result Value Ref Range   Glucose-Capillary 213 (H) 70 - 99 mg/dL  Glucose, capillary     Status: Abnormal   Collection Time: 06/20/14  6:54 AM  Result Value Ref Range   Glucose-Capillary 203 (H) 70 - 99 mg/dL  Glucose, capillary     Status: Abnormal   Collection Time: 06/20/14 11:30 AM  Result Value Ref Range   Glucose-Capillary 239 (H) 70 - 99 mg/dL  Glucose, capillary     Status: Abnormal   Collection Time: 06/20/14  4:19 PM  Result Value Ref Range   Glucose-Capillary 265 (H) 70 - 99 mg/dL   Comment 1 Notify RN   Glucose, capillary     Status: Abnormal   Collection Time: 06/20/14  9:00 PM  Result Value Ref Range   Glucose-Capillary 204 (H) 70 - 99 mg/dL  Glucose, capillary     Status: Abnormal   Collection Time: 06/21/14  7:07 AM  Result Value Ref Range   Glucose-Capillary 219 (H) 70 - 99 mg/dL  Glucose, capillary     Status: Abnormal   Collection Time: 06/21/14 11:46 AM  Result Value Ref Range   Glucose-Capillary 266 (H) 70 - 99 mg/dL   Comment 1 Notify RN   Glucose, capillary     Status: Abnormal   Collection Time: 06/21/14  5:06 PM  Result Value Ref Range   Glucose-Capillary 222 (H) 70 - 99 mg/dL   Comment 1 Notify RN   Glucose, capillary     Status: Abnormal   Collection Time: 06/21/14  8:42 PM  Result Value Ref Range   Glucose-Capillary 268 (H) 70 - 99 mg/dL  Glucose, capillary     Status: Abnormal   Collection Time: 06/22/14  7:07 AM  Result Value Ref Range   Glucose-Capillary 202 (H) 70 - 99 mg/dL     HEENT: tape over lens  of eyeglass on RIght Cardio: RRR Resp: CTA B/L and unlabored GI: BS positive and NT, ND Extremity:  Pulses positive and 2+ edema R pretib,  calf nontender, soft Skin:   Intact Neuro: Alert/Oriented, Abnormal Sensory reduced sensation RUE and RLE, Abnormal Motor 3- R delt , bi, tri, grip, HF, KN, ankle DF is trace and Other ataxia RUE Musc/Skel:  Edema RLE pretib, no erythema no tenderness , no pain with R Ankle ROM Gen NAD  Assessment/Plan: 1. Functional deficits secondary to Left thalamic ICH which require 3+ hours per day of interdisciplinary therapy in a comprehensive inpatient rehab setting. Physiatrist is providing close team supervision and 24 hour management of active medical problems listed below. Physiatrist and rehab team continue to assess barriers to discharge/monitor patient progress toward functional and medical goals.  FIM: FIM - Bathing Bathing Steps Patient Completed: Chest, Abdomen, Right upper leg, Left upper leg,  Right Arm, Left lower leg (including foot), Front perineal area Bathing: 3: Mod-Patient completes 5-7 53f 10 parts or 50-74%  FIM - Upper Body Dressing/Undressing Upper body dressing/undressing steps patient completed: Thread/unthread right sleeve of pullover shirt/dresss, Thread/unthread left sleeve of pullover shirt/dress, Put head through opening of pull over shirt/dress, Pull shirt over trunk Upper body dressing/undressing: 5: Supervision: Safety issues/verbal cues FIM - Lower Body Dressing/Undressing Lower body dressing/undressing steps patient completed: Thread/unthread left pants leg, Thread/unthread right underwear leg, Thread/unthread left underwear leg, Thread/unthread right pants leg Lower body dressing/undressing: 2: Max-Patient completed 25-49% of tasks  FIM - Toileting Toileting steps completed by patient: Performs perineal hygiene Toileting: 2: Max-Patient completed 1 of 3 steps  FIM - Radio producer Devices: Bedside commode, Grab bars Toilet Transfers: 3-From toilet/BSC: Mod A (lift or lower assist), 3-To toilet/BSC: Mod A (lift or lower assist)  FIM - Control and instrumentation engineer Devices: Arm rests Bed/Chair Transfer: 3: Chair or W/C > Bed: Mod A (lift or lower assist), 2: Supine > Sit: Max A (lifting assist/Pt. 25-49%), 2: Sit > Supine: Max A (lifting assist/Pt. 25-49%), 3: Bed > Chair or W/C: Mod A (lift or lower assist)  FIM - Locomotion: Wheelchair Distance: 135 Locomotion: Wheelchair: 2: Travels 50 - 149 ft with supervision, cueing or coaxing FIM - Locomotion: Ambulation Locomotion: Ambulation Assistive Devices: Lite Gait Ambulation/Gait Assistance: 1: +2 Total assist Locomotion: Ambulation: 0: Activity did not occur  Comprehension Comprehension Mode: Auditory Comprehension: 5-Understands complex 90% of the time/Cues < 10% of the time  Expression Expression Mode: Verbal Expression: 4-Expresses basic 75 - 89% of  the time/requires cueing 10 - 24% of the time. Needs helper to occlude trach/needs to repeat words.  Social Interaction Social Interaction Mode: Asleep Social Interaction: 6-Interacts appropriately with others with medication or extra time (anti-anxiety, antidepressant).  Problem Solving Problem Solving Mode: Asleep Problem Solving: 4-Solves basic 75 - 89% of the time/requires cueing 10 - 24% of the time  Memory Memory Mode: Asleep Memory: 4-Recognizes or recalls 75 - 89% of the time/requires cueing 10 - 24% of the time  Medical Problem List and Plan: 1. Functional deficits secondary to Left Thalamic ICH 2. DVT Prophylaxis/Anticoagulation: SCDs.Monitor for signs of DVT 3. Pain Management: Tylenol as needed 4. HCAP. Encouraged incentive spirometer. abx completed. Check oxygen saturations every shift,  wean O2 as able---still requiring however 5. Neuropsych: This patient is capable of making decisions on his own behalf. 6. Skin/Wound Care: Routine skin checks 7. Fluids/Electrolytes/Nutrition: Strict I and O's with follow-up chemistries 8. Mild ileus. Diet has been advanced to regular. No nausea vomiting.   bowel program 9. Diabetes mellitus and peripheral neuropathy. Latest hemoglobin A1c 7.8. Patient with history of insulin pump followed by  Dr. Buddy Duty and endocrine services. Current sliding scale insulin, ask diabetic coordinator to eval 10. Coronary artery disease with balloon angioplasty. No chest pain or shortness of breath.  11. Urinary retention. Continue Flomax. Foley tube remains in place. Follow-up urology services outpatient 12.  Peripheral edema related to weakness RLE, TED hose LOS (Days) 11 A FACE TO FACE EVALUATION WAS PERFORMED  Farren Landa T 06/22/2014, 9:11 AM

## 2014-06-22 NOTE — Progress Notes (Signed)
Occupational Therapy Session Note  Patient Details  Name: Jeffrey [name] MRN: [id number] Date of Birth: 1940-10-13  Today's Date: 06/22/2014 OT Individual Time:  -   1015-1100  (45 min)      Short Term Goals: Week 1:  OT Short Term Goal 1 (Week 1): Pt will peform UB bathing with supervision sitting unsupported. OT Short Term Goal 1 - Progress (Week 1): Not met OT Short Term Goal 2 (Week 1): Pt will complete LB bathing sit to stand with mod assist for 2 consecutive sessions. OT Short Term Goal 2 - Progress (Week 1): Not met OT Short Term Goal 3 (Week 1): Pt will perform LB dressing with mod assist 2 consecutive sessions. OT Short Term Goal 3 - Progress (Week 1): Not met OT Short Term Goal 4 (Week 1): Pt will demonstrate increased awareness of the RUE by placing it in his lap prior to transfers, and LB selfcare with no more than mod instructional cueing.  OT Short Term Goal 4 - Progress (Week 1): Not met OT Short Term Goal 5 (Week 1): Pt will perform toilet transfer with max assist stand pivot.  OT Short Term Goal 5 - Progress (Week 1): Met Week 2:  OT Short Term Goal 1 (Week 2): Pt will peform UB bathing with supervision sitting unsupported. OT Short Term Goal 2 (Week 2): Pt will complete LB bathing sit to stand with mod assist for 2 consecutive sessions. OT Short Term Goal 3 (Week 2): Pt will perform LB dressing with mod assist 2 consecutive sessions. OT Short Term Goal 4 (Week 2): Pt will demonstrate increased awareness of the RUE by placing it in his lap prior to transfers, and LB selfcare with no more than mod instructional cueing.  OT Short Term Goal 5 (Week 2): Pt will perform stand pivot transfer to the 3:1 with mod assist 2 consecutive sessions.    Skilled Therapeutic Interventions/Progress Updates:      Focus of treatment was bed mobility, transfers, Neuro-muscular reeducation, sitting balance, standing balance, attention, therapeutic activities, sustained attention, postural  controlj.  Pt lying in bed.  Went from supine to sit with minimal assist.  Transferred to wc with mod assist and cues for positioning and placement.  Transferred to toilet with mod assist.  Need manual facilitation for placement of LE and RUE.  Pt continent of bowels.  He bathed and dressed self while sitting on toilet.  Did sit to stand x 4 during self care with mod assist.  See FIM.  Pt. Able to grab the bar in bathroom with smooth coordinated movements and release with one verbal cue.  Transferred to wc and left in wc with all needs in place   Therapy Documentation Precautions:  Precautions Precautions: Fall Precaution Comments: right hemiparesis, diplopia, decreased proprioception throughout the RUE and RLE Restrictions Weight Bearing Restrictions: No General:    Pain:  3/10 back          See FIM for current functional status  Therapy/Group: Individual Therapy  Lisa Roca 06/22/2014, 7:53 AM

## 2014-06-23 ENCOUNTER — Inpatient Hospital Stay (HOSPITAL_COMMUNITY): Payer: Medicare Other | Admitting: Speech Pathology

## 2014-06-23 ENCOUNTER — Inpatient Hospital Stay (HOSPITAL_COMMUNITY): Payer: Medicare Other | Admitting: Occupational Therapy

## 2014-06-23 ENCOUNTER — Inpatient Hospital Stay (HOSPITAL_COMMUNITY): Payer: Medicare Other

## 2014-06-23 ENCOUNTER — Inpatient Hospital Stay (HOSPITAL_COMMUNITY): Payer: Medicare Other | Admitting: Physical Therapy

## 2014-06-23 DIAGNOSIS — R05 Cough: Secondary | ICD-10-CM

## 2014-06-23 LAB — GLUCOSE, CAPILLARY
GLUCOSE-CAPILLARY: 182 mg/dL — AB (ref 70–99)
Glucose-Capillary: 175 mg/dL — ABNORMAL HIGH (ref 70–99)
Glucose-Capillary: 262 mg/dL — ABNORMAL HIGH (ref 70–99)
Glucose-Capillary: 150 mg/dL — ABNORMAL HIGH (ref 70–99)

## 2014-06-23 MED ORDER — LEVALBUTEROL HCL 0.63 MG/3ML IN NEBU: 0.6300 mg | INHALATION_SOLUTION | Freq: Four times a day (QID) | RESPIRATORY_TRACT | Status: DC | PRN

## 2014-06-23 MED ORDER — LEVALBUTEROL HCL 0.63 MG/3ML IN NEBU
0.6300 mg | INHALATION_SOLUTION | Freq: Four times a day (QID) | RESPIRATORY_TRACT | Status: DC
  Administered 2014-06-23 (×2): 0.63 mg via RESPIRATORY_TRACT
  Filled 2014-06-23 (×3): qty 3

## 2014-06-23 NOTE — Progress Notes (Signed)
Occupational Therapy Session Note  Patient Details  Name: Jeffrey [name] MRN: [id number] Date of Birth: 1940/06/29  Today's Date: 06/23/2014 OT Individual Time: 0800-0900 OT Individual Time Calculation (min): 60 min    Short Term Goals: Week 2:  OT Short Term Goal 1 (Week 2): Pt will peform UB bathing with supervision sitting unsupported. OT Short Term Goal 2 (Week 2): Pt will complete LB bathing sit to stand with mod assist for 2 consecutive sessions. OT Short Term Goal 3 (Week 2): Pt will perform LB dressing with mod assist 2 consecutive sessions. OT Short Term Goal 4 (Week 2): Pt will demonstrate increased awareness of the RUE by placing it in his lap prior to transfers, and LB selfcare with no more than mod instructional cueing.  OT Short Term Goal 5 (Week 2): Pt will perform stand pivot transfer to the 3:1 with mod assist 2 consecutive sessions.    Skilled Therapeutic Interventions/Progress Updates:    Pt performed shower, toileting and dressing this session.  Mod assist for stand pivot transfer from bed to the wheelchair and from the wheelchair to the tub seat.  Pt continues to need max demonstrational cueing to push up with the LUE from the chair instead of attempting to pull up on the therapist or grasp the sink.  Decreased ability to maintain right knee extension as well.  He was able to perform most bathing with min assist except for washing his bottom and peri area, which required mod assist.  Mod facilitation also needed for RUE functional use as well. He was able to perform toileting transfer by ambulating to the toilet from the shower with mod assist.  Decreased coordination noted with the RLE scissoring and moving into hyperextension.  He performed dressing sit to stand at the sink.  He needs min assist and mod demonstrational cueing to sequence donning pants over his feet by crossing the RLE.  Mod assist for sit to stand and standing to pull them up over his hips.  He completed  grooming tasks with supervision from wheelchair level.  Therapy Documentation Precautions:  Precautions Precautions: Fall Precaution Comments: right hemiparesis, diplopia, decreased proprioception throughout the RUE and RLE Restrictions Weight Bearing Restrictions: No  Pain: Pain Assessment Pain Assessment: No/denies pain ADL: See FIM for current functional status  Therapy/Group: Individual Therapy  Roza Creamer OTR/L 06/23/2014, 10:11 AM

## 2014-06-23 NOTE — Plan of Care (Signed)
Problem: RH Simple Meal Prep Goal: LTG Patient will perform simple meal prep w/assist (OT) LTG: Patient will perform simple meal prep with assistance, with/without cues (OT).  Outcome: Not Applicable Date Met:  09/26/55 Goal discharged as pt going to SNF.  Problem: RH Memory Goal: LTG Patient will demonstrate ability for day to day (OT) LTG: Patient will demonstrate ability for day to day recall/carryover during activities of daily living with assist (OT)  All goals downgraded secondary to discharge plan changing from home to SNF.

## 2014-06-23 NOTE — Progress Notes (Signed)
Social Work Patient ID: Jeffrey [name], male   DOB: 11/03/1940, 73 y.o.   MRN: [id number] Met with wife and spoke with son via telephone to discuss plan for transfer on Wed if medically stable.  Wife feels he will not be ready due to BS are still not stable and he is still having Trouble voiding.  Discussed he is voiding better and it would be up to the MD whether he is ready to transfer on Wed. Son and wife understood. 

## 2014-06-23 NOTE — Progress Notes (Signed)
Occupational Therapy Session Note  Patient Details  Name: EVANDER MACARAEG MRN: [id number] Date of Birth: Jul 18, 1940   Skilled Therapeutic Interventions/Progress Updates:    Pt transferred from supine to sit EOB with min assist.  Mod assist for transfer to the wheelchair stand pivot.  Took pt down to the therapy gym and transferred to the mat.  Pt still with decreased control in the right knee when attempting transfers and standing.  He needs cueing and facilitation to stand up tall and step around to sit.  At times he demonstrates continual flexed posture, attempting to reach for object of stabilization, instead of standing up straight and stepping around.  In sitting focused on small RUE movements using slanted board and large yellow ball.  Had pt attempt to push his hand up the board while maintaining digit extension and then bring it back.  He still demonstrates significant sensory impairment in the UE to be able to control the timing of elbow extension with shoulder flexion.  This was also present when attempting to hold large yellow ball with bilateral UEs and then place on the chair in front of him.  Mod assist needed to help perform both of the above tasks.  Oxygen sats checked at 94% on room air during session.    Therapy Documentation Precautions:  Precautions Precautions: Fall Precaution Comments: right hemiparesis, diplopia, decreased proprioception throughout the RUE and RLE Restrictions Weight Bearing Restrictions: No  Pain: Pain Assessment Pain Assessment: No/denies pain ADL: See FIM for current functional status  Therapy/Group: Individual Therapy  Lucca Ballo OTR/L 06/23/2014, 4:07 PM

## 2014-06-23 NOTE — Progress Notes (Signed)
Speech Language Pathology Daily Session Note  Patient Details  Name: Jeffrey [name] MRN: [id number] Date of Birth: March 18, 1940  Today's Date: 06/23/2014 SLP Individual Time: 1100-1200 SLP Individual Time Calculation (min): 60 min  Short Term Goals: Week 2: SLP Short Term Goal 1 (Week 2): Pt willl utilize compensatory aids to facilitate recall of new information during semi-complex tasks with min assist verbal cues  SLP Short Term Goal 1 - Progress (Week 2): Progressing toward goal SLP Short Term Goal 2 (Week 2): Pt will recognize and correct errors in the moment when completing semi-complex home management tasks with min assist verbal cues.   SLP Short Term Goal 2 - Progress (Week 2): Progressing toward goal SLP Short Term Goal 3 (Week 2): Pt will return demonstration of 2 safety precautions during structured and unstructured therapeutic tasks with supervision cues.   SLP Short Term Goal 3 - Progress (Week 2): Progressing toward goal SLP Short Term Goal 4 (Week 2): Pt will utilize word finding strategies in functional conversations to convey basic to semi-complex needs/wants with min assist verbal cues.  SLP Short Term Goal 4 - Progress (Week 2): Progressing toward goal  Skilled Therapeutic Interventions: Skilled ST intervention provided with focus on cognitive/linguistic goals. Upon arrival, pt was seated upright in wheelchair, awake, alert, talking on the phone with his son (who lives in [place]). Pt tearful after the phone call, but was able to proceed with therapy momentarily. Pt required mod assistance to propel wheelchair to [place] office, where pt was seen for therapy.  SLP facilitated the session with continued focus on recall, functional problem solving and word finding skills in the context of card sorting game. Pt was unable to recall specific instructions of how to play "Blink" in order to teach SLP how to play, but was able to recall general goal of the game to match by color, number  (count), or shape (suit). Pt required mod-max cues to verbalize how cards matched, and required verbal and visual cues to recall what he had assigned the "Joker".  Pt verbalized fatigue at the end of the session, but did not exhibit increasing fatigue as session progressed.    FIM:  Comprehension Comprehension Mode: Auditory Comprehension: 5-Understands complex 90% of the time/Cues < 10% of the time Expression Expression Mode: Verbal Expression: 4-Expresses basic 75 - 89% of the time/requires cueing 10 - 24% of the time. Needs helper to occlude trach/needs to repeat words. Social Interaction Social Interaction: 6-Interacts appropriately with others with medication or extra time (anti-anxiety, antidepressant). Problem Solving Problem Solving: 4-Solves basic 75 - 89% of the time/requires cueing 10 - 24% of the time Memory Memory: 4-Recognizes or recalls 75 - 89% of the time/requires cueing 10 - 24% of the time  Pain Pain Assessment Pain Assessment: No/denies pain  Therapy/Group: Individual Therapy   Celia B. Weston, [facility], [place]  Shonna Chock 06/23/2014, 12:44 PM

## 2014-06-23 NOTE — Progress Notes (Signed)
Inpatient Diabetes Program Recommendations  AACE/ADA: New Consensus Statement on Inpatient Glycemic Control (2013)  Target Ranges:  Prepandial:   less than 140 mg/dL      Peak postprandial:   less than 180 mg/dL (1-2 hours)      Critically ill patients:  140 - 180 mg/dl   Spoke with patient and wife on this past Friday, April 15th. Requested lantus at 20 units, noted 10 units lantus ordered and moderate correction tidwc. Recommend adding some meal coverage novolog of 3-4 units tidwc.  Thank you Rosita Kea, RN, MSN, CDE  Diabetes Inpatient Program Office: [phone] Pager: [phone] 8:00 am to 5:00 pm

## 2014-06-23 NOTE — Progress Notes (Signed)
Subjective/Complaints: Coughs during ADLs but not at rest, non productive, reviewed meds no ACE, no fever or chills did have recent HCAP, prn xopenex but not receiving ROS- neg except weakness and RLE swelling Objective: Vital Signs: Blood pressure 124/78, pulse 83, temperature 98.1 F (36.7 C), temperature source Oral, resp. rate 19, height 5\' 8"  (1.727 m), weight 76.522 kg (168 lb 11.2 oz), SpO2 93 %. No results found. Results for orders placed or performed during the hospital encounter of 06/11/14 (from the past 72 hour(s))  Glucose, capillary     Status: Abnormal   Collection Time: 06/20/14 11:30 AM  Result Value Ref Range   Glucose-Capillary 239 (H) 70 - 99 mg/dL  Glucose, capillary     Status: Abnormal   Collection Time: 06/20/14  4:19 PM  Result Value Ref Range   Glucose-Capillary 265 (H) 70 - 99 mg/dL   Comment 1 Notify RN   Glucose, capillary     Status: Abnormal   Collection Time: 06/20/14  9:00 PM  Result Value Ref Range   Glucose-Capillary 204 (H) 70 - 99 mg/dL  Glucose, capillary     Status: Abnormal   Collection Time: 06/21/14  7:07 AM  Result Value Ref Range   Glucose-Capillary 219 (H) 70 - 99 mg/dL  Glucose, capillary     Status: Abnormal   Collection Time: 06/21/14 11:46 AM  Result Value Ref Range   Glucose-Capillary 266 (H) 70 - 99 mg/dL   Comment 1 Notify RN   Glucose, capillary     Status: Abnormal   Collection Time: 06/21/14  5:06 PM  Result Value Ref Range   Glucose-Capillary 222 (H) 70 - 99 mg/dL   Comment 1 Notify RN   Glucose, capillary     Status: Abnormal   Collection Time: 06/21/14  8:42 PM  Result Value Ref Range   Glucose-Capillary 268 (H) 70 - 99 mg/dL  Glucose, capillary     Status: Abnormal   Collection Time: 06/22/14  7:07 AM  Result Value Ref Range   Glucose-Capillary 202 (H) 70 - 99 mg/dL  Glucose, capillary     Status: Abnormal   Collection Time: 06/22/14 11:50 AM  Result Value Ref Range   Glucose-Capillary 223 (H) 70 - 99 mg/dL   Glucose, capillary     Status: Abnormal   Collection Time: 06/22/14  4:52 PM  Result Value Ref Range   Glucose-Capillary 213 (H) 70 - 99 mg/dL  Glucose, capillary     Status: Abnormal   Collection Time: 06/22/14  9:16 PM  Result Value Ref Range   Glucose-Capillary 192 (H) 70 - 99 mg/dL  Glucose, capillary     Status: Abnormal   Collection Time: 06/23/14  6:53 AM  Result Value Ref Range   Glucose-Capillary 175 (H) 70 - 99 mg/dL     HEENT: tape over lens  of eyeglass on RIght Cardio: RRR Resp: CTA B/L and unlabored GI: BS positive and NT, ND Extremity:  Pulses positive and 2+ edema R pretib, calf nontender, soft Skin:   Intact Neuro: Alert/Oriented, Abnormal Sensory reduced sensation RUE and RLE, Abnormal Motor 3- R delt , bi, tri, grip, HF, KN, ankle DF is trace and Other ataxia RUE Musc/Skel:  Edema RLE pretib, no erythema no tenderness , no pain with R Ankle ROM Gen NAD  Assessment/Plan: 1. Functional deficits secondary to Left thalamic ICH which require 3+ hours per day of interdisciplinary therapy in a comprehensive inpatient rehab setting. Physiatrist is providing close team supervision and  24 hour management of active medical problems listed below. Physiatrist and rehab team continue to assess barriers to discharge/monitor patient progress toward functional and medical goals.  FIM: FIM - Bathing Bathing Steps Patient Completed: Chest, Abdomen, Right upper leg, Left upper leg, Right Arm, Left lower leg (including foot), Front perineal area Bathing: 3: Mod-Patient completes 5-7 41f 10 parts or 50-74%  FIM - Upper Body Dressing/Undressing Upper body dressing/undressing steps patient completed: Thread/unthread right sleeve of pullover shirt/dresss, Thread/unthread left sleeve of pullover shirt/dress, Put head through opening of pull over shirt/dress, Pull shirt over trunk Upper body dressing/undressing: 5: Supervision: Safety issues/verbal cues FIM - Lower Body  Dressing/Undressing Lower body dressing/undressing steps patient completed: Thread/unthread left pants leg, Thread/unthread right underwear leg, Thread/unthread left underwear leg, Thread/unthread right pants leg Lower body dressing/undressing: 3: Mod-Patient completed 50-74% of tasks  FIM - Toileting Toileting steps completed by patient: Performs perineal hygiene Toileting: 2: Max-Patient completed 1 of 3 steps  FIM - Radio producer Devices: Grab bars Toilet Transfers: 3-From toilet/BSC: Mod A (lift or lower assist), 3-To toilet/BSC: Mod A (lift or lower assist)  FIM - Control and instrumentation engineer Devices: Arm rests Bed/Chair Transfer: 3: Chair or W/C > Bed: Mod A (lift or lower assist), 2: Supine > Sit: Max A (lifting assist/Pt. 25-49%), 2: Sit > Supine: Max A (lifting assist/Pt. 25-49%), 3: Bed > Chair or W/C: Mod A (lift or lower assist)  FIM - Locomotion: Wheelchair Distance: 135 Locomotion: Wheelchair: 2: Travels 50 - 149 ft with supervision, cueing or coaxing FIM - Locomotion: Ambulation Locomotion: Ambulation Assistive Devices: Lite Gait Ambulation/Gait Assistance: 1: +2 Total assist Locomotion: Ambulation: 0: Activity did not occur  Comprehension Comprehension Mode: Auditory Comprehension: 5-Understands basic 90% of the time/requires cueing < 10% of the time  Expression Expression Mode: Verbal Expression: 4-Expresses basic 75 - 89% of the time/requires cueing 10 - 24% of the time. Needs helper to occlude trach/needs to repeat words.  Social Interaction Social Interaction Mode: Asleep Social Interaction: 6-Interacts appropriately with others with medication or extra time (anti-anxiety, antidepressant).  Problem Solving Problem Solving Mode: Asleep Problem Solving: 4-Solves basic 75 - 89% of the time/requires cueing 10 - 24% of the time  Memory Memory Mode: Asleep Memory: 4-Recognizes or recalls 75 - 89% of the  time/requires cueing 10 - 24% of the time  Medical Problem List and Plan: 1. Functional deficits secondary to Left Thalamic ICH 2. DVT Prophylaxis/Anticoagulation: SCDs.Monitor for signs of DVT 3. Pain Management: Tylenol as needed 4. HCAP. Encouraged incentive spirometer. abx completed. Check oxygen saturations every shift,  wean O2 as able---still requiring however 5. Neuropsych: This patient is capable of making decisions on his own behalf. 6. Skin/Wound Care: Routine skin checks 7. Fluids/Electrolytes/Nutrition: Strict I and O's with follow-up chemistries 8. Mild ileus. Diet has been advanced to regular. No nausea vomiting.   bowel program 9. Diabetes mellitus and peripheral neuropathy. Latest hemoglobin A1c 7.8. Patient with history of insulin pump followed by Dr. Buddy Duty and endocrine services. Current sliding scale insulin, ask diabetic coordinator to eval 10. Coronary artery disease with balloon angioplasty. No chest pain or shortness of breath.  11. Urinary retention. Continue Flomax. Foley tube remains in place. Follow-up urology services outpatient 12.  Peripheral edema related to weakness RLE, TED hose 13.  Cough suspect some exercised induced brochospasm post PNA, schedule Xopenex LOS (Days) 12 A FACE TO FACE EVALUATION WAS PERFORMED  Laurabelle Gorczyca E 06/23/2014, 8:10 AM

## 2014-06-23 NOTE — Progress Notes (Signed)
Physical Therapy Session Note  Patient Details  Name: Jeffrey [name] MRN: [id number] Date of Birth: 1940-08-17  Today's Date: 06/23/2014 PT Individual Time: 1300-1400 PT Individual Time Calculation (min): 60 min   Short Term Goals: Week 2:  PT Short Term Goal 1 (Week 2): STG's = LTG's secondary to anticipated LOS, planned D/C to SNF.  Skilled Therapeutic Interventions/Progress Updates:  Pt received in w/c in room with wife present; agreeable to therapy.  Session focused on functional transfers and gait.  Pt propelled w/c x125' to/from room using L hemi technique with supervision.  Pt transferred w/c>mat with mod A and mod cues for set-up.  Pt initially only remembered 50% of steps involved in w/c<>mat transfers, but after SPT listed steps and told pt to remember them for later in the session, the pt recalled all of the steps with cueing from SPT later in the session. Pt ambulated x10' around a mat with +2A ('3 musketeers'), then x25' with Harmon Pier walker and +2A for safety and w/c follow.  Pt required multimodal cues for gait sequencing, redirection of advancement/placement of R LE, and blocking the R knee from going into genu recurvatum.  A heel wedge and ace bandage were applied in an attempt to prevent genu recurvatum in the R knee, but were unsuccessful.  With tactile cues at hip extensors for activation, pt no longer exhibited genu recurvatum.  The pt's SpO2 stayed above 90% on RA throughout the entire therapy session. The pt was left seated in w/c in room with wife present and all needs within reach.         Therapy Documentation Precautions:  Precautions Precautions: Fall Precaution Comments: right hemiparesis, diplopia, decreased proprioception throughout the RUE and RLE Restrictions Weight Bearing Restrictions: No Vital Signs: Therapy Vitals Temp: 97.8 F (36.6 C) Temp Source: Oral Pulse Rate: 98 BP: 134/72 mmHg Patient Position (if appropriate): Sitting Oxygen Therapy SpO2: 95  % O2 Device: Not Delivered Pain: Pain Assessment Pain Assessment: No/denies pain Locomotion : Ambulation Ambulation/Gait Assistance: 1: +2 Total assist Wheelchair Mobility Distance: 125   See FIM for current functional status  Therapy/Group: Individual Therapy  Charlett Merkle 06/23/2014, 2:13 PM

## 2014-06-23 NOTE — Progress Notes (Signed)
Social Work Patient ID: Jeffrey [name], male   DOB: June 12, 1940, 74 y.o.   MRN: [id number] Process started to get insurance approval for [facility] on Wed.  Have faxed in information to [facility]. Have contacted [facility] to inform of plan for Wed transfer, since last covered day here.

## 2014-06-24 ENCOUNTER — Inpatient Hospital Stay (HOSPITAL_COMMUNITY): Payer: Medicare Other | Admitting: Speech Pathology

## 2014-06-24 ENCOUNTER — Inpatient Hospital Stay (HOSPITAL_COMMUNITY): Payer: Medicare Other

## 2014-06-24 ENCOUNTER — Inpatient Hospital Stay (HOSPITAL_COMMUNITY): Payer: Medicare Other | Admitting: Occupational Therapy

## 2014-06-24 LAB — GLUCOSE, CAPILLARY
GLUCOSE-CAPILLARY: 164 mg/dL — AB (ref 70–99)
GLUCOSE-CAPILLARY: 185 mg/dL — AB (ref 70–99)
Glucose-Capillary: 208 mg/dL — ABNORMAL HIGH (ref 70–99)
Glucose-Capillary: 216 mg/dL — ABNORMAL HIGH (ref 70–99)

## 2014-06-24 NOTE — Progress Notes (Signed)
Occupational Therapy Discharge Summary  Patient Details  Name: Jeffrey [name] MRN: [id number] Date of Birth: 1940-04-09  Today's Date: 06/24/2014 OT Individual Time: 1245-8099 OT Individual Time Calculation (min): 34 min   Session Note:  Examined pt's vision during session.  Of note pt reports decreased diplopia in all areas with some fusion noted on the left side and at midline as well as both inferior fields of the right and left sides.  When he does report diplopia the objects are not as far apart and appear more of a blurry one per his statement.  Removed tape from glasses for pt to try as he was reporting the tape being slightly difficult to tolerate at this time.  During session also practiced stand pivot transfers with overall mod assist as well as reviewing use of the shoe buttons he was given this am.     Patient has met 10 of 11 long term goals due to improved activity tolerance, improved balance, postural control, ability to compensate for deficits, functional use of  RIGHT upper and RIGHT lower extremity, improved awareness and improved coordination.  Patient to discharge at overall Mod Assist level.  Patient's care partner unavailable to provide the necessary physical and cognitive assistance at discharge.    Reasons goals not met: Pt continues to need mod to max instructional cueing for recall of day to day techniques including RUE safe placement as well as LUE placement during transitional movements.    Recommendation:  Patient will benefit from ongoing skilled OT services in skilled nursing facility setting to continue to advance functional skills in the area of BADL.  [name] continues to demonstrate significant motor planning and sensory deficits in the RUE and RLE resulting in the need for constant mod facilitation for all functional transfers and LB selfcare.  Because of this as well as his limited functional use of the RUE and RLE during ADLs, feel it is not safe for him to  return home with his wife.  Continued SNF level rehab is a must based on his current limitations and his wife's recent surgery, which does not allow her to provide hands on assist at this time.  Diplopia continues to improve as well as carryover day to day but this also needs to continued to be addressed as well.    Equipment: No equipment provided  Reasons for discharge: treatment goals met and discharge from hospital  Patient/family agrees with progress made and goals achieved: Yes  OT Discharge Precautions/Restrictions  Precautions Precautions: Fall Precaution Comments: right hemiparesis, diplopia, decreased proprioception throughout the RUE and RLE Restrictions Weight Bearing Restrictions: No  Vital Signs Therapy Vitals Temp: 97.5 F (36.4 C) Temp Source: Oral Pulse Rate: 91 Resp: 18 BP: 130/80 mmHg Patient Position (if appropriate): Sitting Oxygen Therapy SpO2: 93 % O2 Device: Not Delivered Pain Pain Assessment Pain Assessment: No/denies pain ADL   See FIM scale for levels  Vision/Perception  Vision- History Baseline Vision/History: Wears glasses Wears Glasses: At all times Patient Visual Report: Diplopia Vision- Assessment Vision Assessment?: Yes Eye Alignment: Impaired (comment) (right eye sits superiorly compared to the left) Ocular Range of Motion: Within Functional Limits Alignment/Gaze Preference: Within Defined Limits Tracking/Visual Pursuits: Decreased smoothness of vertical tracking;Decreased smoothness of horizontal tracking Convergence: Impaired (comment) Visual Fields: No apparent deficits Diplopia Assessment: Disappears with one eye closed;Objects split side to side;Present in near gaze Additional Comments: Pt's diplopia has improved and is not present in 75% of the left visual field but more prominent in  the right.  He reports the majority of the diplopia as a blurring of one side by side as opposed to a full seperation.   Cognition Overall  Cognitive Status: Impaired/Different from baseline Arousal/Alertness: Awake/alert Orientation Level: Oriented X4 Attention: Alternating Sustained Attention: Appears intact Alternating Attention: Impaired Alternating Attention Impairment: Functional basic Memory: Impaired Memory Impairment: Decreased recall of new information;Decreased short term memory Decreased Short Term Memory: Functional basic Awareness: Impaired Awareness Impairment: Anticipatory impairment;Emergent impairment Problem Solving: Impaired Problem Solving Impairment: Functional basic Safety/Judgment: Impaired Comments: Pt still with decreased carryover of from session to session regarding the sequencing for transfers or placement of the RUE/LUE.  He also is unable to demonstrate emergent awareness with the RUE is not placed in a safe position after transitional movements or prior to.  Sensation Sensation Light Touch: Impaired Detail Light Touch Impaired Details: Absent RUE Stereognosis: Impaired Detail Stereognosis Impaired Details: Impaired RUE Hot/Cold: Impaired Detail Hot/Cold Impaired Details: Absent RUE Proprioception: Impaired Detail Proprioception Impaired Details: Absent RUE Coordination Gross Motor Movements are Fluid and Coordinated: No Fine Motor Movements are Fluid and Coordinated: No Coordination and Movement Description: Pt currently Brunnstrum stage IV in the right arm and stage VI in the hand.  He continues to demonstrate severe sensory impairment resulting in the need for constant visual attention during functional use as well as mod assist.  Moderate apraxia noted as well when attempting to use during selfcare tasks  Motor  Motor Motor: Hemiplegia;Abnormal postural alignment and control;Motor apraxia Motor - Discharge Observations: R hemiplegia, decreased ability to grade movements in the RUE, trunk, and RLE with all functional use. Mobility  Bed Mobility Bed Mobility: Rolling Left;Left  Sidelying to Sit Rolling Left: 4: Min assist Rolling Left Details: Verbal cues for sequencing;Verbal cues for technique;Manual facilitation for placement Rolling Left Details (indicate cue type and reason): Pt needs mod demonstrational cueing for initiation an sequencing of rolling.   Left Sidelying to Sit: 5: Supervision Left Sidelying to Sit Details: Verbal cues for technique Transfers Transfers: Sit to Stand;Stand to Sit Sit to Stand: From chair/3-in-1;3: Mod assist;With upper extremity assist Sit to Stand Details: Manual facilitation for weight shifting;Verbal cues for technique;Verbal cues for sequencing Stand to Sit: 3: Mod assist;With upper extremity assist Stand to Sit Details (indicate cue type and reason): Verbal cues for technique;Manual facilitation for weight shifting;Verbal cues for precautions/safety;Manual facilitation for placement;Tactile cues for sequencing;Tactile cues for weight shifting  Trunk/Postural Assessment  Cervical Assessment Cervical Assessment: Exceptions to [facility] (slight cervical protraction) Thoracic Assessment Thoracic Assessment: Within Functional Limits Lumbar Assessment Lumbar Assessment: Within Functional Limits Postural Control Postural Control: Deficits on evaluation Trunk Control: Pt maintains a posterior pelvic tilt in sitting with slight right trunk elongation  Balance Balance Balance Assessed: Yes Static Sitting Balance Static Sitting - Balance Support: Feet supported Static Sitting - Level of Assistance: 5: Stand by assistance Dynamic Sitting Balance Dynamic Sitting - Balance Support: No upper extremity supported;Feet supported Dynamic Sitting - Level of Assistance: 5: Stand by assistance Static Standing Balance Static Standing - Balance Support: Left upper extremity supported Static Standing - Level of Assistance: 3: Mod assist Dynamic Standing Balance Dynamic Standing - Balance Support: Left upper extremity supported Dynamic Standing -  Level of Assistance: 3: Mod assist Extremity/Trunk Assessment RUE Assessment RUE Assessment: Exceptions to [facility] RUE AROM (degrees) RUE Overall AROM Comments: Brunnstrum stage IV in the right arm and stage VI in the hand.  He demonstrates synergy pattern with functional use but can exhibit isolated elbow AROM during  selfcare tasks.  Gross grasp and release noted with pt demonstrating opposition thumb to most digits but with decreased control and accurracy.  Noted trigger finger with 4th digit extension..  LUE Assessment LUE Assessment: Within Functional Limits  See FIM for current functional status  Joangel Vanosdol OTR/L 06/24/2014, 4:43 PM

## 2014-06-24 NOTE — Discharge Summary (Signed)
Discharge summary job # [id number]

## 2014-06-24 NOTE — Discharge Summary (Signed)
NAMERAMCES, SHOMAKER NO.:  [id number]  MEDICAL RECORD NO.:  [id number]  LOCATION:  [id number]                        FACILITY:  [facility]  PHYSICIAN:  Alysia Penna, MD        DATE OF BIRTH:  05-Nov-1940  DATE OF ADMISSION:  06/11/2014 DATE OF DISCHARGE:  06/25/2014                              DISCHARGE SUMMARY   DISCHARGE DIAGNOSES: 1. Functional deficits secondary to left thalamic intracerebral     hemorrhage. 2. SCDs for DVT prophylaxis. 3. Hospital community acquired pneumonia, resolved. 4. Mild ileus, resolved. 5. Diabetes mellitus, peripheral neuropathy. 6. Coronary artery disease with balloon angioplasty. 7. Urinary retention. 8. Peripheral edema related to weakness of right lower extremity.  HISTORY OF PRESENT ILLNESS:  This is a 74 year old right-handed male with history of remote tobacco abuse, hypertension, diabetes mellitus with insulin pump, followed by [facility] Dr. Buddy Duty as well as coronary artery disease.  The patient was independent prior to admission, living with his wife prior to admission.  Presented on May 30, 2014 with right-sided weakness and slurred speech.  Cranial CT scan showed acute left thalamic and left internal capsule parenchymal hemorrhage compatible with hypertensive hemorrhage.  MRI again notes thalamic hemorrhage.  MRA of the head negative.  Carotid Dopplers with no ICA stenosis.  Echocardiogram with ejection fraction 62% grade 1 diastolic dysfunction.  Neurology followup with conservative care. Tolerating a regular consistency diet.  Bouts of loose stool with C. diff specimen negative.  Physical and occupational therapy evaluation completed, ongoing.  The patient was admitted for a comprehensive rehab program on June 03, 2014 with therapies ongoing.  He was requiring total assist due to decreased standing balance.  On June 05, 2014 with respiratory distress, oxygen saturations in the 80s, placed on  non- rebreathable mask with Pulmonary Services consulted.  Chest x-ray, concerning for early pneumonia, placed on broad-spectrum antibiotics. He was discharged to [place].  Mild elevation in troponin felt to be related to demand ischemia.  Echocardiogram repeated with ejection fraction 13% grade 1 diastolic dysfunction.  CT of the abdomen revealed mild ileus.  A rectal tube had been placed.  No bowel obstruction noted.  Repeat CT of the chest persistent with lower lung field infiltrates, atelectasis.  Antibiotics changed to Levaquin on June 11, 2014.  Bouts of urinary retention.  Flomax was added to his regimen as well as indwelling Foley catheter that would remain in place with plan to follow up [facility]rology Services, Dr. Junious Silk as an outpatient as needed.  The patient was admitted for a comprehensive rehab program.  PAST MEDICAL HISTORY:  See discharge diagnoses.  SOCIAL HISTORY:  Lives with his wife, independent prior to admission. Functional status upon admission to [facility] was +2 physical assist, stand pivot transfers, +2 physical assist squat pivot transfers; mod-max assist, activities of daily living.  PHYSICAL EXAMINATION:  VITAL SIGNS:  Blood pressure 139/71, pulse 74, temperature 98, respirations 18. GENERAL:  This was an alert male.  He was oriented to person and place. LUNGS:  Decreased breath sounds.  Clear to auscultation. CARDIAC:  Regular rate and rhythm. ABDOMEN:  Soft, nontender.  Good bowel sounds. NEUROLOGIC:  He did have  some difficulty with sustaining attention. Speech was mildly dysarthric, but fully intelligible.  REHABILITATION HOSPITAL COURSE:  The patient was [facility], therapies initiated on a 3-hour daily basis consisting of physical therapy, occupational therapy, speech therapy, and rehabilitation nursing.  The following issues were addressed during the patient's rehabilitation stay.  Pertaining to Mr. Kent'  left thalamic ICH, remained stable, conservative care advised.  Sequential compression devices for DVT prophylaxis.  Noted hospital course of hospital community acquired pneumonia.  He continued with incentive spirometer. He was weaned from his oxygen, followed up every shift, still requiring of less than 90%.  His diet had been advanced to a regular consistency. He did have a history of diabetes mellitus, peripheral neuropathy, presently on Lantus, NovoLog insulin.  He did have an insulin pump, followed by Dr. Buddy Duty which he could be reestablished as an outpatient. Noted history of coronary artery disease, balloon angioplasty, no chest pain or shortness of breath.  No aspirin therapy due to [place].  Flomax ongoing for urinary retention.  Foley catheter tube remained in place. He would follow up with Urology Services as an outpatient.  The patient received weekly collaborative interdisciplinary team conferences to discuss estimated length of stay, family teaching, and any barriers to his discharge.  He could propel as wheelchair a 125 feet with supervision, transferred wheelchair to mat with moderate assist and cues.  The patient initially only remembered 50% of steps and vault and transfers; but after followup repetition, he did much better.  Ambulated 10 feet up to 25 feet with walker again requiring cues.  The patient's oxygen saturations remained greater than 90% on room air throughout his sessions.  He needed assist for bathing, dressing, and grooming.  He had overall weakness and endurance.  Plan was discharged to skilled nursing facility on June 25, 2014.  DISCHARGE MEDICATIONS: 1. Lipitor 40 mg p.o. daily. 2. Lantus insulin 10 units subcutaneously at bedtime. 3. Lactobacillus 1 g p.o. t.i.d. with meals. 4. Xopenex nebulizer treatments every 6 hours as needed, wheezing. 5. Protonix 40 mg p.o. daily. 6. Flomax 0.4 mg p.o. daily.  DIET:  His diet was 1600-2000 calorie ADA  diet.  SPECIAL INSTRUCTIONS:  The patient will follow up with Dr. Alysia Penna at the outpatient rehab center as indicated on 07/25/2014 arrival 30 AM, Dr. Danise Mina of medical management, Dr. Halford Chessman of Pulmonary Services as needed, Dr. Junious Silk in reference to urinary retention and possible plans to remove Foley catheter tube.  Special instructions, continue to monitor oxygen saturations every shift, applied 2 L of oxygen nasal cannula, saturations less than 90%.     Lauraine Rinne, P.A.   ______________________________ Alysia Penna, MD    DA/MEDQ  D:  06/24/2014  T:  06/24/2014  Job:  [id number]  cc:   [name], MD Chesley Mires, MD

## 2014-06-24 NOTE — Progress Notes (Signed)
Speech Language Pathology Daily Session Note  Patient Details  Name: Jeffrey [name] MRN: [id number] Date of Birth: 06/03/1940  Today's Date: 06/24/2014 SLP Individual Time: 1300-1400 SLP Individual Time Calculation (min): 60 min  Short Term Goals: Week 2: SLP Short Term Goal 1 (Week 2): Pt willl utilize compensatory aids to facilitate recall of new information during semi-complex tasks with min assist verbal cues  SLP Short Term Goal 1 - Progress (Week 2): Progressing toward goal SLP Short Term Goal 2 (Week 2): Pt will recognize and correct errors in the moment when completing semi-complex home management tasks with min assist verbal cues.   SLP Short Term Goal 2 - Progress (Week 2): Progressing toward goal SLP Short Term Goal 3 (Week 2): Pt will return demonstration of 2 safety precautions during structured and unstructured therapeutic tasks with supervision cues.   SLP Short Term Goal 3 - Progress (Week 2): Progressing toward goal SLP Short Term Goal 4 (Week 2): Pt will utilize word finding strategies in functional conversations to convey basic to semi-complex needs/wants with min assist verbal cues.  SLP Short Term Goal 4 - Progress (Week 2): Progressing toward goal  Skilled Therapeutic Interventions: Skilled ST intervention provided with focus on informal reassessment of med and financial management. Subtests from [facility] were administered. Pt accurately answered questions after repetition of the question, during task involving reading medication labels and figuring change, counting money.  Pt was noted to self-correct x2.  SLP facilitated session with continued focus on functional medication management task, during which pt was observed reading "pill bottles" and arranging "meds" on a chart. Pt required mod assistance to locate precautions on the bottle (causes dizziness, take with food, etc), and to determine best time of day to take medication (1x/day, may cause drowsiness, likely best at  night). Pt was engaged in conversation about his past vocation, family, current status, plan for DC to SNF tomorrow. No word retrieval deficits were observed during this time (10-15 minutes). Recommend continued therapy at SNF to maximize functional independence and safety, and to reduce burden of care upon DC home.   FIM:  Comprehension Comprehension Mode: Auditory Comprehension: 5-Understands complex 90% of the time/Cues < 10% of the time Expression Expression Mode: Verbal Expression: 5-Expresses basic 90% of the time/requires cueing < 10% of the time. Social Interaction Social Interaction: 6-Interacts appropriately with others with medication or extra time (anti-anxiety, antidepressant). Problem Solving Problem Solving: 4-Solves basic 75 - 89% of the time/requires cueing 10 - 24% of the time Memory Memory: 4-Recognizes or recalls 75 - 89% of the time/requires cueing 10 - 24% of the time  Pain Pain Assessment Pain Assessment: No/denies pain  Therapy/Group: Individual Therapy   Celia B. [place], [facility], [place]  Shonna Chock 06/24/2014, 4:41 PM

## 2014-06-24 NOTE — Progress Notes (Signed)
Physical Therapy Discharge Summary  Patient Details  Name: Jeffrey [name] MRN: [id number] Date of Birth: 12-Apr-1940  Today's Date: 06/24/2014 PT Individual Time: 1100-1200 PT Individual Time Calculation (min): 60 min    Patient has met 4 of 5 long term goals due to improved postural control and ability to compensate for deficits.  Patient to discharge at a wheelchair level Supervision w/ [place] for transfers. Pt discharging to skilled nursing facility  Reasons goals not met: N/A  Recommendation:  Patient will benefit from ongoing skilled PT services in skilled nursing facility setting to continue to advance safe functional mobility, address ongoing impairments in standing balance, functional use of RUE/RLE, and minimize fall risk.  Equipment: No equipment provided  Reasons for discharge: discharge from hospital  Patient/family agrees with progress made and goals achieved: Yes  PT Discharge Precautions/Restrictions Precautions Precautions: Fall Precaution Comments: right hemiparesis, diplopia, decreased proprioception throughout the RUE and RLE Restrictions Weight Bearing Restrictions: No Vital Signs  Pain Pain Assessment Pain Assessment: No/denies pain Pain Score: 0-No pain Vision/Perception     Cognition   Sensation Sensation Light Touch: Impaired by gross assessment Light Touch Impaired Details: Absent RUE;Absent RLE Proprioception: Impaired by gross assessment Coordination Gross Motor Movements are Fluid and Coordinated: No Coordination and Movement Description: Limited by impaired activation and weakness on R side Motor  Motor Motor: Hemiplegia;Abnormal postural alignment and control;Motor apraxia Motor - Discharge Observations: R hemiplegia  Mobility Bed Mobility Bed Mobility: Supine to Sit;Sit to Supine Supine to Sit: 5: Supervision Supine to Sit Details (indicate cue type and reason): cueing for LE management Sit to Supine: 4: Min assist Sit to Supine  - Details (indicate cue type and reason): assist for safe trunk management and with RLE Transfers Transfers: Yes Sit to Stand: From bed Locomotion  Ambulation Ambulation: Yes Ambulation/Gait Assistance: 1: +2 Total assist;2: Max assist (MaxA of 1, +2 for w/c follow) Ambulation Distance (Feet): 25 Feet Assistive device:  (rail on L) Ambulation/Gait Assistance Details: Tactile cues for initiation;Tactile cues for sequencing;Tactile cues for weight shifting;Tactile cues for placement Gait Gait Pattern: Impaired Stairs / Additional Locomotion Stairs: No Wheelchair Mobility Wheelchair Mobility: Yes Wheelchair Assistance: 5: Supervision Wheelchair Propulsion: Left upper extremity;Left lower extremity  Trunk/Postural Assessment  Cervical Assessment Cervical Assessment: Exceptions to [facility] (forward head) Thoracic Assessment Thoracic Assessment: Within Functional Limits Lumbar Assessment Lumbar Assessment: Within Functional Limits Postural Control Postural Control: Deficits on evaluation Trunk Control: decreased trunk control in unsupported sitting  Balance Balance Balance Assessed: Yes Static Sitting Balance Static Sitting - Balance Support: Feet supported Static Sitting - Level of Assistance: 5: Stand by assistance Dynamic Sitting Balance Dynamic Sitting - Balance Support: No upper extremity supported;Feet supported Dynamic Sitting - Level of Assistance: 5: Stand by assistance Dynamic Sitting - Balance Activities: Lateral lean/weight shifting;Forward lean/weight shifting;Reaching across midline;Trunk control activities Static Standing Balance Static Standing - Balance Support: Left upper extremity supported Static Standing - Level of Assistance: 3: Mod assist Dynamic Standing Balance Dynamic Standing - Balance Support: Left upper extremity supported Dynamic Standing - Level of Assistance: 3: Mod assist Extremity Assessment      RLE Strength Right Hip Flexion: 2+/5 Right Knee  Flexion: 3+/5 Right Knee Extension: 3+/5 Right Ankle Dorsiflexion: 3+/5 Right Ankle Plantar Flexion: 3+/5 RLE Tone RLE Tone: Moderate;Hypertonic LLE Assessment LLE Assessment: Within Functional Limits  See FIM for current functional status  Rada Hay 06/24/2014, 11:24 AM

## 2014-06-24 NOTE — Progress Notes (Signed)
Social Work Patient ID: Jeffrey [name], male   DOB: 11/06/1940, 74 y.o.   MRN: [id number] Met with pt and spoke with son and wife to inform ready to transfer to [facility] tomorrow.  MD feels he is medically stable and ready to transfer. Spoke with [facility] admissions and she will get with wife to complete paperwork. Work on [employer].

## 2014-06-24 NOTE — Progress Notes (Signed)
Occupational Therapy Session Note  Patient Details  Name: Jeffrey [name] MRN: [id number] Date of Birth: 03/14/1940  Today's Date: 06/24/2014 OT Individual Time: 0800-0900 OT Individual Time Calculation (min): 60 min    Short Term Goals: Week 2:  OT Short Term Goal 1 (Week 2): Pt will peform UB bathing with supervision sitting unsupported. OT Short Term Goal 2 (Week 2): Pt will complete LB bathing sit to stand with mod assist for 2 consecutive sessions. OT Short Term Goal 3 (Week 2): Pt will perform LB dressing with mod assist 2 consecutive sessions. OT Short Term Goal 4 (Week 2): Pt will demonstrate increased awareness of the RUE by placing it in his lap prior to transfers, and LB selfcare with no more than mod instructional cueing.  OT Short Term Goal 5 (Week 2): Pt will perform stand pivot transfer to the 3:1 with mod assist 2 consecutive sessions.    Skilled Therapeutic Interventions/Progress Updates:    Bathing and dressing shower level during session.  He required mod assist for stand pivot transfer from wheelchair to the shower seat.  Still needing mod demonstrational cueing for sequencing transfer including scooting to the edge of chair and pushing up from the surface of the seat.  Once in the shower he was able to remove all of his clothing with min assist.  He performed shower sit to stand with mod assist.  He continues to need mod facilitation to incorporate use of the RUE as a gross assist when opening his soap or when using as an active assist for washing the LUE.  Dressing at the sink with pt being able to recall hemi techniques.  He was able to thread both the brief and his pants with supervision and increased time.  Mod assist for sit to stand and standing balance when attempting to pull them up.  Pt with increased LOB forward and backwards secondary to ataxic movement in his trunk and pelvis in standing.  Educated pt on hemi technique for donning socks and he was able to return  demonstrate.  Also provided shoe buttons for pt to use as he cannot tie his shoes at this time.  Oxygen sats 93% on room air during session.  Pt still with frequent coughing but not as frequent as last week.   Therapy Documentation Precautions:  Precautions Precautions: Fall Precaution Comments: right hemiparesis, diplopia, decreased proprioception throughout the RUE and RLE Restrictions Weight Bearing Restrictions: No  Pain: Pain Assessment Pain Assessment: No/denies pain Pain Score: 0-No pain ADL: See FIM for current functional status  Therapy/Group: Individual Therapy  Zadaya Cuadra OTR/L 06/24/2014, 12:15 PM

## 2014-06-24 NOTE — Plan of Care (Signed)
Problem: RH Memory Goal: LTG Patient will demonstrate ability for day to day (OT) LTG: Patient will demonstrate ability for day to day recall/carryover during activities of daily living with assist (OT)  Outcome: Not Met (add Reason) Needs mod assist to recall placement of the LUE during transitions as well as maintaining the RUE in a safe positon.

## 2014-06-24 NOTE — Plan of Care (Signed)
Problem: RH Bed to Chair Transfers Goal: LTG Patient will perform bed/chair transfers w/assist (PT) LTG: Patient will perform bed/chair transfers with assistance, with/without cues (PT).  Outcome: Not Met (add Reason) Pt required ModA for safe transfer bed<>w/c, pt able to verbalize setup and sequencing of transfer w/ min cueing

## 2014-06-24 NOTE — Progress Notes (Signed)
Subjective/Complaints: No issues overnite, pt without SOB ROS- neg except weakness and RLE swelling Objective: Vital Signs: Blood pressure 144/70, pulse 88, temperature 98.4 F (36.9 C), temperature source Oral, resp. rate 19, height 5\' 8"  (1.727 m), weight 77.65 kg (171 lb 3 oz), SpO2 90 %. Dg Chest 2 View  06/23/2014   CLINICAL DATA:  Cough for 3 weeks.  EXAM: CHEST  2 VIEW  COMPARISON:  06/20/2014  FINDINGS: Hazy airspace disease in the posterior lower lobe on the lateral view which may reflect atelectasis versus pneumonia. No significant interval change.  No other focal parenchymal opacity. No pleural effusion or pneumothorax. Stable cardiomediastinal silhouette. No acute osseous abnormality.  IMPRESSION: Hazy airspace disease in the posterior lower lobe on the lateral view which may reflect atelectasis versus pneumonia. No significant interval change.   Electronically Signed   By: Kathreen Devoid   On: 06/23/2014 14:56   Results for orders placed or performed during the hospital encounter of 06/11/14 (from the past 72 hour(s))  Glucose, capillary     Status: Abnormal   Collection Time: 06/21/14 11:46 AM  Result Value Ref Range   Glucose-Capillary 266 (H) 70 - 99 mg/dL   Comment 1 Notify RN   Glucose, capillary     Status: Abnormal   Collection Time: 06/21/14  5:06 PM  Result Value Ref Range   Glucose-Capillary 222 (H) 70 - 99 mg/dL   Comment 1 Notify RN   Glucose, capillary     Status: Abnormal   Collection Time: 06/21/14  8:42 PM  Result Value Ref Range   Glucose-Capillary 268 (H) 70 - 99 mg/dL  Glucose, capillary     Status: Abnormal   Collection Time: 06/22/14  7:07 AM  Result Value Ref Range   Glucose-Capillary 202 (H) 70 - 99 mg/dL  Glucose, capillary     Status: Abnormal   Collection Time: 06/22/14 11:50 AM  Result Value Ref Range   Glucose-Capillary 223 (H) 70 - 99 mg/dL  Glucose, capillary     Status: Abnormal   Collection Time: 06/22/14  4:52 PM  Result Value Ref  Range   Glucose-Capillary 213 (H) 70 - 99 mg/dL  Glucose, capillary     Status: Abnormal   Collection Time: 06/22/14  9:16 PM  Result Value Ref Range   Glucose-Capillary 192 (H) 70 - 99 mg/dL  Glucose, capillary     Status: Abnormal   Collection Time: 06/23/14  6:53 AM  Result Value Ref Range   Glucose-Capillary 175 (H) 70 - 99 mg/dL  Glucose, capillary     Status: Abnormal   Collection Time: 06/23/14 12:06 PM  Result Value Ref Range   Glucose-Capillary 262 (H) 70 - 99 mg/dL   Comment 1 Notify RN   Glucose, capillary     Status: Abnormal   Collection Time: 06/23/14  4:17 PM  Result Value Ref Range   Glucose-Capillary 150 (H) 70 - 99 mg/dL   Comment 1 Notify RN   Glucose, capillary     Status: Abnormal   Collection Time: 06/23/14  9:00 PM  Result Value Ref Range   Glucose-Capillary 182 (H) 70 - 99 mg/dL  Glucose, capillary     Status: Abnormal   Collection Time: 06/24/14  6:59 AM  Result Value Ref Range   Glucose-Capillary 164 (H) 70 - 99 mg/dL     HEENT: tape over lens  of eyeglass on RIght Cardio: RRR Resp: CTA B/L and unlabored GI: BS positive and NT, ND Extremity:  Pulses positive and 2+ edema R pretib, calf nontender, soft Skin:   Intact Neuro: Alert/Oriented, Abnormal Sensory reduced sensation RUE and RLE, Abnormal Motor 3- R delt , bi, tri, grip, HF, KN, ankle DF is trace and Other ataxia RUE Musc/Skel:  Edema RLE pretib, no erythema no tenderness , no pain with R Ankle ROM Gen NAD  Assessment/Plan: 1. Functional deficits secondary to Left thalamic ICH which require 3+ hours per day of interdisciplinary therapy in a comprehensive inpatient rehab setting. Physiatrist is providing close team supervision and 24 hour management of active medical problems listed below. Physiatrist and rehab team continue to assess barriers to discharge/monitor patient progress toward functional and medical goals.  FIM: FIM - Bathing Bathing Steps Patient Completed: Chest, Abdomen,  Right upper leg, Left upper leg, Right Arm, Left lower leg (including foot), Front perineal area Bathing: 3: Mod-Patient completes 5-7 79f 10 parts or 50-74%  FIM - Upper Body Dressing/Undressing Upper body dressing/undressing steps patient completed: Thread/unthread right sleeve of pullover shirt/dresss, Thread/unthread left sleeve of pullover shirt/dress, Put head through opening of pull over shirt/dress, Pull shirt over trunk Upper body dressing/undressing: 5: Supervision: Safety issues/verbal cues FIM - Lower Body Dressing/Undressing Lower body dressing/undressing steps patient completed: Thread/unthread left pants leg, Thread/unthread right underwear leg, Thread/unthread left underwear leg, Thread/unthread right pants leg, Don/Doff right shoe Lower body dressing/undressing: 3: Mod-Patient completed 50-74% of tasks  FIM - Toileting Toileting steps completed by patient: Performs perineal hygiene Toileting: 2: Max-Patient completed 1 of 3 steps  FIM - Radio producer Devices: Bedside commode Toilet Transfers: 3-From toilet/BSC: Mod A (lift or lower assist), 3-To toilet/BSC: Mod A (lift or lower assist)  FIM - Control and instrumentation engineer Devices: Arm rests Bed/Chair Transfer: 3: Bed > Chair or W/C: Mod A (lift or lower assist), 3: Chair or W/C > Bed: Mod A (lift or lower assist)  FIM - Locomotion: Wheelchair Distance: 125 Locomotion: Wheelchair: 2: Travels 50 - 149 ft with supervision, cueing or coaxing FIM - Locomotion: Ambulation Locomotion: Ambulation Assistive Devices: Nutritional therapist Ambulation/Gait Assistance: 1: +2 Total assist Locomotion: Ambulation: 1: Two helpers  Comprehension Comprehension Mode: Auditory Comprehension: 5-Understands basic 90% of the time/requires cueing < 10% of the time  Expression Expression Mode: Verbal Expression: 4-Expresses basic 75 - 89% of the time/requires cueing 10 - 24% of the time. Needs helper  to occlude trach/needs to repeat words.  Social Interaction Social Interaction Mode: Asleep Social Interaction: 6-Interacts appropriately with others with medication or extra time (anti-anxiety, antidepressant).  Problem Solving Problem Solving Mode: Asleep Problem Solving: 4-Solves basic 75 - 89% of the time/requires cueing 10 - 24% of the time  Memory Memory Mode: Asleep Memory: 4-Recognizes or recalls 75 - 89% of the time/requires cueing 10 - 24% of the time  Medical Problem List and Plan: 1. Functional deficits secondary to Left Thalamic ICH 2. DVT Prophylaxis/Anticoagulation: SCDs.Monitor for signs of DVT 3. Pain Management: Tylenol as needed 4. HCAP. Encouraged incentive spirometer. abx completed. Check oxygen saturations every shift,  wean O2 as able---still requiring however 5. Neuropsych: This patient is capable of making decisions on his own behalf. 6. Skin/Wound Care: Routine skin checks 7. Fluids/Electrolytes/Nutrition: Strict I and O's with follow-up chemistries 8. Mild ileus. Diet has been advanced to regular. No nausea vomiting.   bowel program 9. Diabetes mellitus and peripheral neuropathy. Latest hemoglobin A1c 7.8. Patient with history of insulin pump followed by Dr. Buddy Duty and endocrine services. Current sliding scale insulin, ask  diabetic coordinator to eval 10. Coronary artery disease with balloon angioplasty. No chest pain or shortness of breath.  11. Urinary retention. Continue Flomax. Foley tube remains in place. Follow-up urology services outpatient 12.  Peripheral edema related to weakness RLE, TED hose 13.  Cough suspect some exercised induced brochospasm post PNA, schedule Xopenex, F/U CXR showed clearing since prior study LOS (Days) 13 A FACE TO FACE EVALUATION WAS PERFORMED  Jeffrey [name] E 06/24/2014, 7:54 AM

## 2014-06-25 LAB — GLUCOSE, CAPILLARY: Glucose-Capillary: 172 mg/dL — ABNORMAL HIGH (ref 70–99)

## 2014-06-25 NOTE — Progress Notes (Signed)
Social Work Discharge Note Discharge Note  The overall goal for the admission was met for:   Discharge location: Yes-[facility]  Length of Stay: Yes-14 DAYS  Discharge activity level: NO-MIN LEVEL/SOME MOD  Home/community participation: Yes  Services provided included: MD, RD, PT, OT, SLP, RN, CM, TR, Pharmacy and SW  Financial Services: Private Insurance: BLUE MEDICARE  Follow-up services arranged: Other: [facility]  Comments (or additional information):WIFE RECOVERING FROM SHOULDER SURGERY AND CAN NOT PROVIDE THE CARE PT NEEDS AT THIS TIME. HOPE IS MORE REHAB CAN GO HOME FROM NH  Patient/Family verbalized understanding of follow-up arrangements: Yes  Individual responsible for coordination of the follow-up plan: PATIENT & JANE-WIFE  Confirmed correct DME delivered: Elease Hashimoto 06/25/2014    Elease Hashimoto

## 2014-06-25 NOTE — Progress Notes (Signed)
Speech Language Pathology Discharge Summary  Patient Details  Name: Jeffrey [name] MRN: [id number] Date of Birth: 06/10/1940   Patient has met 3 of 4 long term goals.  Patient to discharge at overall Min;Supervision level.  Reasons goals not met: Pt continues to require min cues to recognize and correct errors in the moment.    Clinical Impression/Discharge Summary:  Pt made functional gains while inpatient and is discharging having met 3 out of 4 long term goals.  Pt currently requires min assist-supervision during basic cognitive tasks due to decreased emergent awareness of deficits and decreased working memory and recall of new information.  Pt also presents with mild, intermittent word finding deficits in conversations for which he requires min assist-supervision cues for use of strategies.  Pt is discharging to SNF where it is recommended that he receive additional SLP intervention to address cognitive-linguistic deficits.  Also recommend continued family education at next level of care prior to discharge.     Care Partner:  Caregiver Able to Provide Assistance: Other (comment) (SNF)  Type of Caregiver Assistance:  (SNF)  Recommendation:  24 hour supervision/assistance;Home Health SLP;Skilled Nursing facility  Rationale for SLP Follow Up: Maximize functional communication;Maximize cognitive function and independence;Reduce caregiver burden   Equipment: none recommended by SLP    Reasons for discharge: Discharged from hospital   Patient/Family Agrees with Progress Made and Goals Achieved: Yes   See FIM for current functional status  [name] 06/25/2014, 12:33 PM

## 2014-06-25 NOTE — Progress Notes (Signed)
Subjective/Complaints: Slept well, off O2, no longer requiring caths ROS- neg except weakness and RLE swelling Objective: Vital Signs: Blood pressure 142/79, pulse 83, temperature 98.2 F (36.8 C), temperature source Oral, resp. rate 18, height 5\' 8"  (1.727 m), weight 78.79 kg (173 lb 11.2 oz), SpO2 90 %. Dg Chest 2 View  06/23/2014   CLINICAL DATA:  Cough for 3 weeks.  EXAM: CHEST  2 VIEW  COMPARISON:  06/20/2014  FINDINGS: Hazy airspace disease in the posterior lower lobe on the lateral view which may reflect atelectasis versus pneumonia. No significant interval change.  No other focal parenchymal opacity. No pleural effusion or pneumothorax. Stable cardiomediastinal silhouette. No acute osseous abnormality.  IMPRESSION: Hazy airspace disease in the posterior lower lobe on the lateral view which may reflect atelectasis versus pneumonia. No significant interval change.   Electronically Signed   By: Kathreen Devoid   On: 06/23/2014 14:56   Results for orders placed or performed during the hospital encounter of 06/11/14 (from the past 72 hour(s))  Glucose, capillary     Status: Abnormal   Collection Time: 06/22/14 11:50 AM  Result Value Ref Range   Glucose-Capillary 223 (H) 70 - 99 mg/dL  Glucose, capillary     Status: Abnormal   Collection Time: 06/22/14  4:52 PM  Result Value Ref Range   Glucose-Capillary 213 (H) 70 - 99 mg/dL  Glucose, capillary     Status: Abnormal   Collection Time: 06/22/14  9:16 PM  Result Value Ref Range   Glucose-Capillary 192 (H) 70 - 99 mg/dL  Glucose, capillary     Status: Abnormal   Collection Time: 06/23/14  6:53 AM  Result Value Ref Range   Glucose-Capillary 175 (H) 70 - 99 mg/dL  Glucose, capillary     Status: Abnormal   Collection Time: 06/23/14 12:06 PM  Result Value Ref Range   Glucose-Capillary 262 (H) 70 - 99 mg/dL   Comment 1 Notify RN   Glucose, capillary     Status: Abnormal   Collection Time: 06/23/14  4:17 PM  Result Value Ref Range   Glucose-Capillary 150 (H) 70 - 99 mg/dL   Comment 1 Notify RN   Glucose, capillary     Status: Abnormal   Collection Time: 06/23/14  9:00 PM  Result Value Ref Range   Glucose-Capillary 182 (H) 70 - 99 mg/dL  Glucose, capillary     Status: Abnormal   Collection Time: 06/24/14  6:59 AM  Result Value Ref Range   Glucose-Capillary 164 (H) 70 - 99 mg/dL  Glucose, capillary     Status: Abnormal   Collection Time: 06/24/14 11:57 AM  Result Value Ref Range   Glucose-Capillary 208 (H) 70 - 99 mg/dL  Glucose, capillary     Status: Abnormal   Collection Time: 06/24/14  4:19 PM  Result Value Ref Range   Glucose-Capillary 185 (H) 70 - 99 mg/dL  Glucose, capillary     Status: Abnormal   Collection Time: 06/24/14  8:44 PM  Result Value Ref Range   Glucose-Capillary 216 (H) 70 - 99 mg/dL  Glucose, capillary     Status: Abnormal   Collection Time: 06/25/14  7:37 AM  Result Value Ref Range   Glucose-Capillary 172 (H) 70 - 99 mg/dL     HEENT: tape over lens  of eyeglass on RIght Cardio: RRR Resp: CTA B/L and unlabored GI: BS positive and NT, ND Extremity:  Pulses positive and 2+ edema R pretib, calf nontender, soft Skin:  Intact Neuro: Alert/Oriented, Abnormal Sensory reduced sensation RUE and RLE, Abnormal Motor 3- R delt , bi, tri, grip, HF, KN, ankle DF is trace and Other ataxia RUE Musc/Skel:  Edema RLE pretib, no erythema no tenderness , no pain with R Ankle ROM Gen NAD  Assessment/Plan: 1. Functional deficits secondary to Left thalamic ICH  Stable for D/C today SNF transfer F/u PM&R 3-4 weeks See D/C summary See D/C instructions FIM: FIM - Bathing Bathing Steps Patient Completed: Chest, Abdomen, Right upper leg, Left upper leg, Right Arm, Left lower leg (including foot), Front perineal area, Right lower leg (including foot) Bathing: 4: Min-Patient completes 8-9 43f 10 parts or 75+ percent  FIM - Upper Body Dressing/Undressing Upper body dressing/undressing steps patient  completed: Thread/unthread right sleeve of pullover shirt/dresss, Thread/unthread left sleeve of pullover shirt/dress, Put head through opening of pull over shirt/dress, Pull shirt over trunk Upper body dressing/undressing: 5: Supervision: Safety issues/verbal cues FIM - Lower Body Dressing/Undressing Lower body dressing/undressing steps patient completed: Thread/unthread right underwear leg, Thread/unthread left underwear leg, Thread/unthread right pants leg, Thread/unthread left pants leg, Don/Doff right sock, Don/Doff right shoe, Don/Doff left shoe, Don/Doff left sock, Fasten/unfasten right shoe, Fasten/unfasten left shoe Lower body dressing/undressing: 4: Steadying Assist  FIM - Toileting Toileting steps completed by patient: Performs perineal hygiene Toileting: 2: Max-Patient completed 1 of 3 steps  FIM - Radio producer Devices: Bedside commode Toilet Transfers: 3-From toilet/BSC: Mod A (lift or lower assist), 3-To toilet/BSC: Mod A (lift or lower assist)  FIM - Control and instrumentation engineer Devices: Arm rests Bed/Chair Transfer: 4: Sit > Supine: Min A (steadying pt. > 75%/lift 1 leg), 5: Supine > Sit: Supervision (verbal cues/safety issues), 3: Bed > Chair or W/C: Mod A (lift or lower assist), 3: Chair or W/C > Bed: Mod A (lift or lower assist)  FIM - Locomotion: Wheelchair Distance: 125 Locomotion: Wheelchair: 5: Travels 150 ft or more: maneuvers on rugs and over door sills with supervision, cueing or coaxing FIM - Locomotion: Ambulation Locomotion: Ambulation Assistive Devices:  (rail on L) Ambulation/Gait Assistance: 1: +2 Total assist, 2: Max assist (MaxA of 1, +2 for w/c follow) Locomotion: Ambulation: 1: Two helpers  Comprehension Comprehension Mode: Auditory Comprehension: 5-Understands complex 90% of the time/Cues < 10% of the time  Expression Expression Mode: Verbal Expression: 5-Expresses complex 90% of the time/cues < 10%  of the time  Social Interaction Social Interaction Mode: Asleep Social Interaction: 6-Interacts appropriately with others with medication or extra time (anti-anxiety, antidepressant).  Problem Solving Problem Solving Mode: Asleep Problem Solving: 5-Solves basic problems: With no assist  Memory Memory Mode: Asleep Memory: 5-Recognizes or recalls 90% of the time/requires cueing < 10% of the time  Medical Problem List and Plan: 1. Functional deficits secondary to Left Thalamic ICH Cont PT, OT at SNF  LOS (Days) 14 A FACE TO FACE EVALUATION WAS PERFORMED  Jabriel Vanduyne E 06/25/2014, 8:53 AM

## 2014-06-25 NOTE — Plan of Care (Signed)
Problem: RH Awareness Goal: LTG: Patient will demonstrate intellectual/emergent (SLP) LTG: Patient will demonstrate intellectual/emergent/anticipatory awareness with assist during a cognitive/linguistic activity (SLP)  Outcome: Not Met (add Reason) Pt continues to require min cues to recognize and correct errors in the moment

## 2014-06-25 NOTE — Progress Notes (Signed)
Discharged to SNF at 1045 via [facility].

## 2014-06-26 ENCOUNTER — Encounter: Payer: Self-pay | Admitting: Registered Nurse

## 2014-06-26 ENCOUNTER — Non-Acute Institutional Stay (SKILLED_NURSING_FACILITY): Payer: Medicare Other | Admitting: Registered Nurse

## 2014-06-26 DIAGNOSIS — N400 Enlarged prostate without lower urinary tract symptoms: Secondary | ICD-10-CM

## 2014-06-26 DIAGNOSIS — E1142 Type 2 diabetes mellitus with diabetic polyneuropathy: Secondary | ICD-10-CM

## 2014-06-26 DIAGNOSIS — R000 Tachycardia, unspecified: Secondary | ICD-10-CM | POA: Diagnosis not present

## 2014-06-26 DIAGNOSIS — K5901 Slow transit constipation: Secondary | ICD-10-CM | POA: Diagnosis not present

## 2014-06-26 DIAGNOSIS — K219 Gastro-esophageal reflux disease without esophagitis: Secondary | ICD-10-CM

## 2014-06-26 DIAGNOSIS — E785 Hyperlipidemia, unspecified: Secondary | ICD-10-CM | POA: Diagnosis not present

## 2014-06-26 DIAGNOSIS — I629 Nontraumatic intracranial hemorrhage, unspecified: Secondary | ICD-10-CM | POA: Diagnosis not present

## 2014-06-26 DIAGNOSIS — I2510 Atherosclerotic heart disease of native coronary artery without angina pectoris: Secondary | ICD-10-CM | POA: Diagnosis not present

## 2014-06-26 DIAGNOSIS — E1140 Type 2 diabetes mellitus with diabetic neuropathy, unspecified: Secondary | ICD-10-CM | POA: Diagnosis not present

## 2014-06-26 NOTE — Progress Notes (Signed)
Patient ID: Jeffrey [name], male   DOB: 14-Apr-1940, 74 y.o.   MRN: [id number]   Place of Service: [facility]  Allergies  Allergen Reactions  . Niacin Other (See Comments)    REACTION: intolerant,not allergic="flushing,hot flashes,turning red"    Code Status: Full Code  Goals of Care: Longevity/STR  Chief Complaint  Patient presents with  . Hospitalization Follow-up    HPI Review of hospital record showed 74 y.o. male with PMH of CAD, BPH, tobacco abuse, DM2 with insulin pump, HLD, gout, HTN among others is being seen for a follow-up visit post hospital admission from 06/11/14 to 06/25/14 for left thalamic intracranial hemorrhage, HCAP, and ileus. He was independent prior to recent hospitalization. Presented to ED on 05/30/14 with slurred speech and right-sided weakness. Heat CT showed acute left thalamic and left internal capsule parenchymal hemorrhage compatible with hypertensive hemorrhage. MRI showed similar finding. Carotid dopplers with no ICA stenosis. Echo showed EF 65% with grade 1 diastolic dysfunction. He was then admitted for comprehensive rehab program on 06/03/14. Developed PNA and ileus during rehab program, but resolved by time of discharge. He is here now for additional rehab. His goal is to return home. Seen in room today. Denies any concerns this visit.   Review of Systems Constitutional: Negative for fever, chills, and fatigue. HENT: Negative for ear pain, congestion, and sore throat Eyes: Negative for eye pain, eye discharge, and visual disturbance  Cardiovascular: Negative for chest pain, palpitations, and leg swelling Respiratory: Negative cough, shortness of breath, and wheezing.  Gastrointestinal: Negative for nausea and vomiting. Negative for abdominal pain. Positive for constipation "sometimes" Genitourinary: Negative for  dysuria, frequency, urgency, and hematuria Endocrine: Negative for polydipsia, polyphagia, and polyuria Musculoskeletal: Negative  uncontrolled pain.  Neurological: Negative for dizziness and headache. Positive for right-sided weakness  Skin: Negative for rash and wound.   Psychiatric: Negative for depression  Past Medical History  Diagnosis Date  . CAD (coronary artery disease)     PCI distal RCA ...2004, residual 70% LAD   /   ...nuclear...03/2007...no ischemia.Marland KitchenMarland Kitchenpreserved LV /  nuclear...03/03/2009...inferior scar..no ischemia..EF 51%  . Dyslipidemia     takes Atorvastatin daily  . Internal hemorrhoids   . Gout     takes Allopurinol daily and Colchicine as needed  . Seasonal allergic rhinitis   . Right carotid bruit   . Cyst of nasopharynx     per ENT Wilburn Cornelia  . Myocardial infarction 72yrs ago  . Peripheral edema     takes Lasix daily  . HTN (hypertension)     takes Cardura,Metoprolol,[name],and Amlodipine daily  . Arthritis   . GERD (gastroesophageal reflux disease)     takes Omeprazole daily  . History of colon polyps   . Type 2 diabetes, uncontrolled, with neuropathy 1989    takes Invokana daily and has an insulin pump (Dr. [name])  . 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 Cataract extraction Bilateral 2010  . Elbow surgery Right 1997  . Balloon angioplasty, artery  1992, 2004    CAD, Dr. Ron Parker  . Colonoscopy  04/03/2002    adenomatous polyp, int hemorrhoids  . Colonoscopy  07/18/2007    normal (Dr. Fuller Plan)  . Rotator cuff repair  09/2011    left, with subacromial decompression  . Nasal septum surgery    . Colonoscopy  09/26/2012    tubular adenoma, sm int hem, rpt  5 yrs Fuller Plan)  . Tonsillectomy and adenoidectomy    . Eye lids raised    . Cardiac catheterization  2004  . Polypectomy N/A 05/27/2013    Procedure: ENDOSCOPIC NASOPHARYNGEAL MASS;  Surgeon: [name], MD    History  Substance Use Topics  . Smoking status: Former Research scientist (life sciences)  . Smokeless tobacco: Never Used     Comment:  quit smoking 45 years ago.  . Alcohol Use: No    Family History  Problem Relation Age of Onset  . Alcohol abuse Father   . Coronary artery disease Neg Hx   . Stroke Neg Hx   . Cancer Neg Hx   . Diabetes Neg Hx   . Colon cancer Neg Hx       Medication List       This list is accurate as of: 06/26/14 11:14 AM.  Always use your most recent med list.               atorvastatin 40 MG tablet  Commonly known as:  LIPITOR  Take 1 tablet (40 mg total) by mouth daily.     docusate sodium 100 MG capsule  Commonly known as:  COLACE  Take 100 mg by mouth 2 (two) times daily.     insulin glargine 100 UNIT/ML injection  Commonly known as:  LANTUS  Inject 10 Units into the skin at bedtime.     LACTOBACILLUS PO  Take 1 g by mouth 3 (three) times daily.     levalbuterol 0.31 MG/3ML nebulizer solution  Commonly known as:  XOPENEX  Take 1 ampule by nebulization every 6 (six) hours as needed for wheezing.     pantoprazole 40 MG tablet  Commonly known as:  PROTONIX  Take 40 mg by mouth daily.     polyethylene glycol packet  Commonly known as:  MIRALAX / GLYCOLAX  Take 17 g by mouth daily as needed for mild constipation or moderate constipation.     sennosides-docusate sodium 8.6-50 MG tablet  Commonly known as:  SENOKOT-S  Take 2 tablets by mouth daily.     tamsulosin 0.4 MG Caps capsule  Commonly known as:  FLOMAX  Take 1 capsule (0.4 mg total) by mouth daily.        Physical Exam  BP 122/86 mmHg  Pulse 71  Temp(Src) 97.9 F (36.6 C)  Resp 18  Ht 5\' 10"  (1.778 m)  Wt 179 lb (81.194 kg)  BMI 25.68 kg/m2  SpO2 95%  Constitutional: WDWN elderly male in no acute distress. Conversant and pleasant HEENT: Normocephalic and atraumatic. PERRL. EOM intact. No icterus. Oral mucosa moist. Posterior pharynx clear of any exudate or lesions. Teeth and gingiva in good general condition.  Neck: Supple and nontender. No lymphadenopathy, masses, or thyromegaly. No JVD or carotid  bruits. Cardiac: Slightly tachycardic. No appreciable murmurs, rubs, or gallops. Distal pulses intact. Trace dependent edema.  Respiratory: Unlabored respiration. Breath sounds clear bilaterally without rales, rhonchi, or wheezes. GI: Audible bowel sounds in all quadrants. Soft, nontender, nondistended.  Musculoskeletal: Right hemiparesis.  Skin: Warm and dry. No rash noted. No erythema.  Neurological: Alert and oriented to self.  Psychiatric: Judgment and insight adequate. Appropriate mood and affect.   Labs Reviewed  CBC Latest Ref Rng 06/12/2014 06/09/2014 06/07/2014  WBC 4.0 - 10.5 K/uL 8.3 9.7 9.4  Hemoglobin 13.0 - 17.0 g/dL 14.1 12.7(L) 12.8(L)  Hematocrit 39.0 - 52.0 % 40.8 37.6(L) 38.6(L)  Platelets 150 - 400 K/uL 199 113(L) 101(L)  CMP Latest Ref Rng 06/12/2014 06/11/2014 06/10/2014  Glucose 70 - 99 mg/dL 166(H) 155(H) 133(H)  BUN 6 - 23 mg/dL 11 11 12   Creatinine 0.50 - 1.35 mg/dL 0.81 0.80 0.81  Sodium 135 - 145 mmol/L 136 137 139  Potassium 3.5 - 5.1 mmol/L 3.7 3.5 3.2(L)  Chloride 96 - 112 mmol/L 103 103 106  CO2 19 - 32 mmol/L 23 26 27   Calcium 8.4 - 10.5 mg/dL 8.4 8.5 8.3(L)  Total Protein 6.0 - 8.3 g/dL 5.4(L) - 5.3(L)  Albumin - - - -  Total Bilirubin 0.3 - 1.2 mg/dL 1.2 - 1.2  Alkaline Phos 39 - 117 U/L 105 - 93  AST 0 - 37 U/L 25 - 17  ALT 0 - 53 U/L 29 - 24    Lab Results  Component Value Date   HGBA1C 7.8* 04/29/2014   Lipid Panel     Component Value Date/Time   CHOL 168 06/02/2014 0810   CHOL 211 02/01/2011   TRIG 316* 06/02/2014 0810   TRIG 342 09/30/2013   TRIG 342 09/30/2013   TRIG 522 02/01/2011   TRIG 522 02/01/2011   HDL 29* 06/02/2014 0810   CHOLHDL 5.8 06/02/2014 0810   VLDL 63* 06/02/2014 0810   LDLCALC 76 06/02/2014 0810   LDLCALC 98 09/30/2013   LDLCALC 98 09/30/2013   LDLDIRECT 89.6 02/07/2012 1457    Diagnostic Studies Reviewed 05/31/14: MRI head 1. Left thalamic hemorrhage probably represents hemorrhagic transformation of a left  thalamic and posterior corona radiata infarct. Intra-axial hemorrhage, edema, and mild mass effect have not significantly changed. 2. Increased intraventricular and bilateral subarachnoid hemorrhage since yesterday. No ventriculomegaly. 3. Negative intracranial MRA. 4. Underlying mild for age nonspecific signal changes compatible with chronic small vessel disease. Study discussed by telephone with Neurology Dr. Wallie Char on 05/31/2014 at 1626 hours.  06/05/14: CT abdomen RIGHT lower lobe pneumonia with moderate-sized pleural effusion. Gallstones. No signs of acute cholecystitis. Mild ileus without bowel obstruction. Rectal tube good position.  06/13/14: EKG: Sinus tachycardia  06/19/14: 2D Echo - Left ventricle: The cavity size was normal. Wall thickness was normal. Systolic function was normal. The estimated ejection fraction was in the range of 55% to 60%. Regional wall motion abnormalities cannot be excluded. Doppler parameters are consistent with abnormal left ventricular relaxation (grade 1 diastolic dysfunction).  06/23/14: CXR Hazy airspace disease in the posterior lower lobe on the lateral view which may reflect atelectasis versus pneumonia. No significant interval change  Assessment & Plan 1. Nontraumatic intracranial hemorrhage With right hemiparesis. Continue to work with PT/OT for gait/strength/balance training to restore/maximize functions. Fall risk precautions. Continue to monitor his status.   2. GERD Continue protonix 40mg  daily.   3. Slow transit constipation Start colace 100mg  twice daily with senokot-s 2 tabs daily and miralax 17g daily as needed. Encourage hydration and ambulation as tolerated.   4. HLD (hyperlipidemia) LDL 76. Continue lipitor 40mg  daily  5. BPH (benign prostatic hyperplasia) Continue flomax 0.4mg  daily and monitor  6. Type 2 diabetes mellitus with peripheral neuropathy Last a1c 7.8. Continue lantus 10 units at bedtime for now. Monitor cbg  daily   7. Coronary artery disease involving native coronary artery of native heart without angina pectoris S/p balloon angioplasty. Denies any chest pain. Continue statin. Monitor clinically  8. Tachycardia HR 108-110s. Asymptomatic. Monitor BP and HR Qshift x1 week then daily. Continue to monitor for now  Diagnostic Studies/Labs Ordered: cbc, bmp in 1 week  Time spent: 40 minutes with >  50% of total time on care coordination   Family/Staff Communication Plan of care discussed with patient and nursing staff. Patient and nursing staff verbalized understanding and agree with plan of care. No additional questions or concerns reported.    Arthur Holms, MSN, AGNP-C [facility] [address] [place], [place] [address] [phone] [8am-5pm] After hours: [phone]

## 2014-06-30 ENCOUNTER — Non-Acute Institutional Stay (SKILLED_NURSING_FACILITY): Payer: Medicare Other | Admitting: Internal Medicine

## 2014-06-30 DIAGNOSIS — N400 Enlarged prostate without lower urinary tract symptoms: Secondary | ICD-10-CM

## 2014-06-30 DIAGNOSIS — E1140 Type 2 diabetes mellitus with diabetic neuropathy, unspecified: Secondary | ICD-10-CM | POA: Diagnosis not present

## 2014-06-30 DIAGNOSIS — E1165 Type 2 diabetes mellitus with hyperglycemia: Secondary | ICD-10-CM

## 2014-06-30 DIAGNOSIS — I612 Nontraumatic intracerebral hemorrhage in hemisphere, unspecified: Secondary | ICD-10-CM

## 2014-06-30 DIAGNOSIS — I2510 Atherosclerotic heart disease of native coronary artery without angina pectoris: Secondary | ICD-10-CM

## 2014-06-30 DIAGNOSIS — I10 Essential (primary) hypertension: Secondary | ICD-10-CM

## 2014-06-30 DIAGNOSIS — K219 Gastro-esophageal reflux disease without esophagitis: Secondary | ICD-10-CM

## 2014-06-30 DIAGNOSIS — R000 Tachycardia, unspecified: Secondary | ICD-10-CM | POA: Diagnosis not present

## 2014-06-30 DIAGNOSIS — M6289 Other specified disorders of muscle: Secondary | ICD-10-CM

## 2014-06-30 DIAGNOSIS — R531 Weakness: Secondary | ICD-10-CM

## 2014-06-30 DIAGNOSIS — K5901 Slow transit constipation: Secondary | ICD-10-CM | POA: Diagnosis not present

## 2014-06-30 DIAGNOSIS — IMO0002 Reserved for concepts with insufficient information to code with codable children: Secondary | ICD-10-CM

## 2014-06-30 DIAGNOSIS — E785 Hyperlipidemia, unspecified: Secondary | ICD-10-CM

## 2014-06-30 NOTE — Progress Notes (Signed)
Patient ID: Jeffrey [name], male   DOB: March 10, 1940, 74 y.o.   MRN: [id number]     Facility: [facility]    PCP: [name], MD  Code Status: full code  Allergies  Allergen Reactions  . Niacin Other (See Comments)    REACTION: intolerant,not allergic="flushing,hot flashes,turning red"    Chief Complaint  Patient presents with  . New Admit To SNF     HPI:  74 year old patient is here for short term rehabilitation post hospital admission from 06/11/14-06/25/14 with functional deficit post left thalamic intracerebral hemorrhage. Heat CT showed acute left thalamic and left internal capsule parenchymal hemorrhage compatible with hypertensive hemorrhage. MRI showed similar finding. Carotid dopplers with no ICA stenosis. Echo showed EF 65% with grade 1 diastolic dysfunction. He was seen by neurology and conservative management planned. He was in inpatient rehabilitation prior to transfer to this SNF. He was treated for HCAP and has completed his antibiotics. He had ileus and required rectal tube. He also had urinary retention and required foley. Both of them have been removed now. He is seen in his room today. He has been working with therapy team and feels that he is making some progress. He is concerned about his blood sugar readings running high. On review cbg in 200-300 range. He was on insulin pump before and here he is on lantus daily. No other concerns He has PMH of CAD, DM2 with insulin pump, HLD, HTN, BPH among others.  Review of Systems:  Constitutional: Negative for fever, chills, diaphoresis.  HENT: Negative for headache, congestion, nasal discharge, difficulty swallowing.   Eyes: Negative for eye pain, blurred vision, double vision and discharge.  Respiratory: Negative for cough, shortness of breath and wheezing.   Cardiovascular: Negative for chest pain, palpitations, leg swelling.  Gastrointestinal: Negative for heartburn, nausea, vomiting,  abdominal pain. Had a bowel movement last night. Genitourinary: Negative for dysuria, flank pain.  Musculoskeletal: Negative for back pain, falls. On a wheelchair at present Skin: Negative for itching, rash.  Neurological: Negative for dizziness, tingling. Has right sided weakness Psychiatric/Behavioral: Negative for depression, anxiety, insomnia.   Past Medical History  Diagnosis Date  . CAD (coronary artery disease)     PCI distal RCA ...2004, residual 70% LAD   /   ...nuclear...03/2007...no ischemia.Marland KitchenMarland Kitchenpreserved LV /  nuclear...03/03/2009...inferior scar..no ischemia..EF 51%  . Dyslipidemia     takes Atorvastatin daily  . Internal hemorrhoids   . Gout     takes Allopurinol daily and Colchicine as needed  . Seasonal allergic rhinitis   . Right carotid bruit   . Cyst of nasopharynx     per ENT Wilburn Cornelia  . Myocardial infarction 63yrs ago  . Peripheral edema     takes Lasix daily  . HTN (hypertension)     takes Cardura,Metoprolol,[name],and Amlodipine daily  . Arthritis   . GERD (gastroesophageal reflux disease)     takes Omeprazole daily  . History of colon polyps   . Type 2 diabetes, uncontrolled, with neuropathy 1989    takes Invokana daily and has an insulin pump (Dr. [name])  . 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 Cataract extraction Bilateral 2010  . Elbow surgery Right 1997  . Balloon angioplasty, artery  1992, 2004    CAD, Dr. Ron Parker  . Colonoscopy  04/03/2002    adenomatous polyp, int hemorrhoids  . Colonoscopy  07/18/2007  normal (Dr. Fuller Plan)  . Rotator cuff repair  09/2011    left, with subacromial decompression  . Nasal septum surgery    . Colonoscopy  09/26/2012    tubular adenoma, sm int hem, rpt 5 yrs Fuller Plan)  . Tonsillectomy and adenoidectomy    . Eye lids raised    . Cardiac catheterization  2004  . Polypectomy N/A 05/27/2013    Procedure: ENDOSCOPIC  NASOPHARYNGEAL MASS;  Surgeon: [name], MD   Social History:   reports that he has quit smoking. He has never used smokeless tobacco. He reports that he does not drink alcohol or use illicit drugs.  Family History  Problem Relation Age of Onset  . Alcohol abuse Father   . Coronary artery disease Neg Hx   . Stroke Neg Hx   . Cancer Neg Hx   . Diabetes Neg Hx   . Colon cancer Neg Hx     Medications: Patient's Medications  New Prescriptions   No medications on file  Previous Medications   ATORVASTATIN (LIPITOR) 40 MG TABLET    Take 1 tablet (40 mg total) by mouth daily.   DOCUSATE SODIUM (COLACE) 100 MG CAPSULE    Take 100 mg by mouth 2 (two) times daily.   INSULIN GLARGINE (LANTUS) 100 UNIT/ML INJECTION    Inject 10 Units into the skin at bedtime.   LACTOBACILLUS PO    Take 1 g by mouth 3 (three) times daily.   LEVALBUTEROL (XOPENEX) 0.31 MG/3ML NEBULIZER SOLUTION    Take 1 ampule by nebulization every 6 (six) hours as needed for wheezing.   PANTOPRAZOLE (PROTONIX) 40 MG TABLET    Take 40 mg by mouth daily.   POLYETHYLENE GLYCOL (MIRALAX / GLYCOLAX) PACKET    Take 17 g by mouth daily as needed for mild constipation or moderate constipation.   SENNOSIDES-DOCUSATE SODIUM (SENOKOT-S) 8.6-50 MG TABLET    Take 2 tablets by mouth daily.   TAMSULOSIN (FLOMAX) 0.4 MG CAPS CAPSULE    Take 1 capsule (0.4 mg total) by mouth daily.  Modified Medications   No medications on file  Discontinued Medications   No medications on file     Physical Exam: Filed Vitals:   06/30/14 1113  BP: 142/85  Pulse: 103  Temp: 98 F (36.7 C)  Resp: 18  SpO2: 97%    General- elderly male, well built, in no acute distress Head- normocephalic, atraumatic Throat- moist mucus membrane  Eyes- PERRLA, EOMI, no pallor, no icterus, no discharge, normal conjunctiva, normal sclera Neck- no cervical lymphadenopathy Cardiovascular- normal s1,s2, no murmurs, palpable dorsalis pedis and radial pulses,  right leg edema present Respiratory- bilateral clear to auscultation, no wheeze, no rhonchi, no crackles, no use of accessory muscles Abdomen- bowel sounds present, soft, non tender Musculoskeletal- able to move all 4 extremities, right sided weakness present with strength 4/5 Neurological- normal speech, right sided weakness present Skin- warm and dry Psychiatry- alert and oriented to person, place and time, normal mood and affect    Labs reviewed: Basic Metabolic Panel:  Recent Labs  06/06/14 0402  06/10/14 0733 06/11/14 0531 06/12/14 0631  NA 144  < > 139 137 136  K 3.6  < > 3.2* 3.5 3.7  CL 115*  < > 106 103 103  CO2 22  < > 27 26 23   GLUCOSE 134*  < > 133* 155* 166*  BUN 28*  < > 12 11 11   CREATININE 1.11  < > 0.81 0.80 0.81  CALCIUM 9.1  < >  8.3* 8.5 8.4  MG 2.2  --  2.0  --   --   PHOS 2.5  --   --   --   --   < > = values in this interval not displayed. Liver Function Tests:  Recent Labs  06/04/14 0602 06/10/14 0733 06/12/14 0631  AST 19 17 25   ALT 32 24 29  ALKPHOS 96 93 105  BILITOT 2.1* 1.2 1.2  PROT 5.9* 5.3* 5.4*  ALBUMIN 3.2* 2.3* 2.5*   No results for input(s): LIPASE, AMYLASE in the last 8760 hours. No results for input(s): AMMONIA in the last 8760 hours. CBC:  Recent Labs  05/31/14 1515 06/04/14 0602  06/07/14 0417 06/09/14 0339 06/12/14 0631  WBC 10.1 15.3*  < > 9.4 9.7 8.3  NEUTROABS 7.6 13.2*  --   --   --  6.8  HGB 14.8 15.7  < > 12.8* 12.7* 14.1  HCT 44.7 46.0  < > 38.6* 37.6* 40.8  MCV 92.5 90.2  < > 90.6 88.7 88.5  PLT 143* 120*  < > 101* 113* 199  < > = values in this interval not displayed. Cardiac Enzymes:  Recent Labs  06/05/14 1316 06/05/14 1710 06/06/14 0019  TROPONINI 1.25* 1.56* 0.96*   BNP: Invalid input(s): POCBNP CBG:  Recent Labs  06/24/14 1619 06/24/14 2044 06/25/14 0737  GLUCAP 185* 216* 172*   Lab Results  Component Value Date   HGBA1C 7.8* 04/29/2014   Lipid Panel     Component Value  Date/Time   CHOL 168 06/02/2014 0810   CHOL 211 02/01/2011   TRIG 316* 06/02/2014 0810   TRIG 342 09/30/2013   TRIG 342 09/30/2013   TRIG 522 02/01/2011   TRIG 522 02/01/2011   HDL 29* 06/02/2014 0810   CHOLHDL 5.8 06/02/2014 0810   VLDL 63* 06/02/2014 0810   LDLCALC 76 06/02/2014 0810   LDLCALC 98 09/30/2013   LDLCALC 98 09/30/2013   LDLDIRECT 89.6 02/07/2012 1457     Assessment/Plan  Right sided weakness Post CVA. Will have him work with physical therapy and occupational therapy team to help with gait training and muscle strengthening exercises.fall precautions. Skin care. Encourage to be out of bed.   Left thalamic stroke With right hemiparesis. Continue to work with PT/OT to help restore and maximize his function. fall precautions. Off antiplatelet agent for now with his bleed. Continue lipitor  Constipation Stable with colace and senokot s for now. Also on prn miralax  BPH  Off foley catheter, no urinary retention at present, Continue flomax 0.4mg  daily. Defer urology consult for now, if urinary retention recurs, consider referral  Type 2 diabetes mellitus with peripheral neuropathy Recent a1c 7.8. Continue lantus 10 units at bedtime and add premeal humalog with cbg check tid. humalog 5 u for cbg 150-250, 8 u for cbg 251-350 and 10 u for cbg > 350. Adjust lantus dosing further depending on humalog requirement. Continue statin.  HTN bp on review elevated. With recent stroke and hx of CAD and DM, start metoprolol 12.5 mg bid and reassess. Goal bp < 130/80  Tachycardia Regular heart rate on exam. Denies palpitations. Check EKG. with elevated bp and HR, add metoprolol 12.5 mg bid for now. Monitor bp and HR q shift and adjust further if needed. Goal HR< 90 at rest  GERD Continue protonix 40mg  daily.   hyperlipidemia LDL 76. Continue lipitor 40mg  daily  Coronary artery disease  S/p balloon angioplasty. Denies any chest pain. Continue statin. Monitor clinically  Goals  of care: short term rehabilitation   Labs/tests ordered: none  Family/ staff Communication: reviewed care plan with patient and nursing supervisor    Blanchie Serve, MD  [facility] [phone] (Monday-Friday 8 am - 5 pm) [phone] (afterhours)

## 2014-07-11 ENCOUNTER — Non-Acute Institutional Stay (SKILLED_NURSING_FACILITY): Payer: Medicare Other | Admitting: Internal Medicine

## 2014-07-11 DIAGNOSIS — E1140 Type 2 diabetes mellitus with diabetic neuropathy, unspecified: Secondary | ICD-10-CM

## 2014-07-11 DIAGNOSIS — Z7189 Other specified counseling: Secondary | ICD-10-CM

## 2014-07-11 DIAGNOSIS — E1165 Type 2 diabetes mellitus with hyperglycemia: Secondary | ICD-10-CM

## 2014-07-11 DIAGNOSIS — R634 Abnormal weight loss: Secondary | ICD-10-CM

## 2014-07-11 DIAGNOSIS — H532 Diplopia: Secondary | ICD-10-CM | POA: Diagnosis not present

## 2014-07-11 DIAGNOSIS — I618 Other nontraumatic intracerebral hemorrhage: Secondary | ICD-10-CM

## 2014-07-11 DIAGNOSIS — IMO0002 Reserved for concepts with insufficient information to code with codable children: Secondary | ICD-10-CM

## 2014-07-11 NOTE — Progress Notes (Signed)
Patient ID: Jeffrey [name], male   DOB: Jul 18, 1940, 74 y.o.   MRN: [id number]    Facility: [facility]   Chief Complaint  Patient presents with  . Acute Visit    concerns from family, elevated blood sugar reading, double vision   Allergies  Allergen Reactions  . Niacin Other (See Comments)    REACTION: intolerant,not allergic="flushing,hot flashes,turning red"    HPI:   74 year old patient is here for short term rehabilitation post hospital admission with left thalamic intracerebral hemorrhage with right sided weakness. He is seen in his room today with concerns from the wife. She needs a form from [place] filled out and would like to know if I can fill it out. She also would like to know why he is not on aspirin or plavix to prevent another stroke. He complaints of double vision which has been there since stroke and has not improved. Denies worsening of the symptom. On review of his records, he has elevated blood sugar reading. Before breakfast cbg 195-225, and other readings 230-321. He has been working with therapy team. No other concerns from patient or family. On review of records, pt has lost around 12 lbs since admission. When asked, patient mentions he used to snack and not eat healthy meals before. Here, he has been following the diet provided by facility and has not been snacking. He feels like he has lost some weight. He has PMH of CAD, DM2 with insulin pump, HLD, HTN, BPH among others.  Review of Systems:  Constitutional: Negative for fever, chills, diaphoresis.  HENT: Negative for headache, congestion   Eyes: Negative for eye pain, discharge.  Respiratory: Negative for cough, shortness of breath and wheezing.   Cardiovascular: Negative for chest pain, palpitations, leg swelling.  Gastrointestinal: Negative for heartburn, nausea, vomiting, abdominal pain.  Genitourinary: Negative for dysuria, flank pain.  Musculoskeletal: right sided weakness. On a  wheelchair at present Skin: Negative for itching, rash.  Neurological: Negative for dizziness, tingling. Psychiatric/Behavioral: Negative for depression, anxiety, insomnia.  Past Medical History  Diagnosis Date  . CAD (coronary artery disease)     PCI distal RCA ...2004, residual 70% LAD   /   ...nuclear...03/2007...no ischemia.Marland KitchenMarland Kitchenpreserved LV /  nuclear...03/03/2009...inferior scar..no ischemia..EF 51%  . Dyslipidemia     takes Atorvastatin daily  . Internal hemorrhoids   . Gout     takes Allopurinol daily and Colchicine as needed  . Seasonal allergic rhinitis   . Right carotid bruit   . Cyst of nasopharynx     per ENT Wilburn Cornelia  . Myocardial infarction 72yrs ago  . Peripheral edema     takes Lasix daily  . HTN (hypertension)     takes Cardura,Metoprolol,[name],and Amlodipine daily  . Arthritis   . GERD (gastroesophageal reflux disease)     takes Omeprazole daily  . History of colon polyps   . Type 2 diabetes, uncontrolled, with neuropathy 1989    takes Invokana daily and has an insulin pump (Dr. [name])  . 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 See [facility]  Physical exam BP 110/60 mmHg  Pulse 81  Temp(Src) 98.3 F (36.8 C)  Resp 18  Wt 167 lb 11.2 oz (76.068 kg)  General- elderly male, well built, in no acute distress Head- normocephalic, atraumatic Throat- moist mucus membrane   Eyes- PERRLA, EOMI, no pallor, no icterus, no discharge, normal conjunctiva, normal sclera, diplopia reported by  patient Neck- no cervical lymphadenopathy Cardiovascular- normal s1,s2, no murmurs, palpable dorsalis pedis and radial pulses, right leg edema present Respiratory- bilateral clear to auscultation, no wheeze, no rhonchi, no crackles, no use of accessory muscles Abdomen- bowel sounds present, soft, non tender Musculoskeletal- able to move all 4 extremities, right sided weakness present with strength 4/5 Neurological-  normal speech, right sided weakness present Skin- warm and dry Psychiatry- alert and oriented to person, place and time, normal mood and affect  Labs- Lab Results  Component Value Date   HGBA1C 7.8* 04/29/2014    Assessment/plan  Uncontrolled Type 2 diabetes mellitus 2/16 a1c 7.8. Reviewed cbg. Increase lantus to 15 u daily and change premeal humalog to 5 u for cbg 150-200, 8 u for cbg 201-300 and 10 u for cbg 301-400. Continue statin. Check a1c  Right sided weakness Post CVA. Will have him work with physical therapy and occupational therapy team to help with gait training and muscle strengthening exercises.fall precautions. Skin care. Encourage to be out of bed.  Off antiplatelet agent for now with his bleed. Continue lipitor. To keep him off antiplatelet agent for now with hemorrhagic stroke  Family counselling Explained to wife that patient is not on any antiplatelet agent for now because he had a hemorrhagic stroke. We will need for the bleed to resolve before starting of blood thinning agent. Wife voices understanding this. Has pending neurology follow up.also informed wife that if the form is regarding his medical condition at present and his stay in SNF, I will be able to fill it out. She will bring the form.  Weight loss Not underweight at present but has significant weight loss. Will check prealbumin level to assess for protein loss. Consider adding prostat if level is low. Reviewed care plan with dietician  Diplopia Post CVA. Persists. No acute worsening. No eye pain. EOMI. Will have him be seen by ophthalmology when they come to the building for routine visit this month.   Labs- a1c, prealbumin   Time : 45 minutes with greater than 50% of total time on counselling about use of antiplatelet, filling out of form, vision trouble, uncontrolled sugar to the patient and his wife. Answered questions from his wife. Reviewed care plan with nursing staff and dietician   Blanchie Serve, MD  [facility] [phone] (Monday-Friday 8 am - 5 pm) [phone] (afterhours)

## 2014-07-14 LAB — BASIC METABOLIC PANEL
BUN: 12 mg/dL (ref 4–21)
Creatinine: 1 mg/dL (ref 0.6–1.3)
Glucose: 203 mg/dL
POTASSIUM: 4.6 mmol/L (ref 3.4–5.3)
Sodium: 141 mmol/L (ref 137–147)

## 2014-07-14 LAB — CBC AND DIFFERENTIAL
HCT: 38 % — AB (ref 41–53)
HEMOGLOBIN: 12.9 g/dL — AB (ref 13.5–17.5)
Platelets: 146 10*3/uL — AB (ref 150–399)
WBC: 6.6 10^3/mL

## 2014-07-25 ENCOUNTER — Inpatient Hospital Stay: Payer: Medicare Other | Admitting: Physical Medicine & Rehabilitation

## 2014-07-30 ENCOUNTER — Encounter: Payer: Self-pay | Admitting: Registered Nurse

## 2014-07-30 ENCOUNTER — Non-Acute Institutional Stay (SKILLED_NURSING_FACILITY): Payer: Medicare Other | Admitting: Registered Nurse

## 2014-07-30 DIAGNOSIS — H532 Diplopia: Secondary | ICD-10-CM | POA: Diagnosis not present

## 2014-07-30 DIAGNOSIS — R634 Abnormal weight loss: Secondary | ICD-10-CM

## 2014-07-30 DIAGNOSIS — K5901 Slow transit constipation: Secondary | ICD-10-CM

## 2014-07-30 DIAGNOSIS — N400 Enlarged prostate without lower urinary tract symptoms: Secondary | ICD-10-CM | POA: Diagnosis not present

## 2014-07-30 DIAGNOSIS — I2510 Atherosclerotic heart disease of native coronary artery without angina pectoris: Secondary | ICD-10-CM

## 2014-07-30 DIAGNOSIS — E785 Hyperlipidemia, unspecified: Secondary | ICD-10-CM

## 2014-07-30 DIAGNOSIS — G8190 Hemiplegia, unspecified affecting unspecified side: Secondary | ICD-10-CM | POA: Diagnosis not present

## 2014-07-30 DIAGNOSIS — I10 Essential (primary) hypertension: Secondary | ICD-10-CM

## 2014-07-30 DIAGNOSIS — I618 Other nontraumatic intracerebral hemorrhage: Secondary | ICD-10-CM | POA: Diagnosis not present

## 2014-07-30 DIAGNOSIS — K219 Gastro-esophageal reflux disease without esophagitis: Secondary | ICD-10-CM

## 2014-07-30 DIAGNOSIS — E1165 Type 2 diabetes mellitus with hyperglycemia: Secondary | ICD-10-CM

## 2014-07-30 DIAGNOSIS — G8191 Hemiplegia, unspecified affecting right dominant side: Secondary | ICD-10-CM

## 2014-07-30 DIAGNOSIS — IMO0002 Reserved for concepts with insufficient information to code with codable children: Secondary | ICD-10-CM

## 2014-07-30 DIAGNOSIS — E1140 Type 2 diabetes mellitus with diabetic neuropathy, unspecified: Secondary | ICD-10-CM | POA: Diagnosis not present

## 2014-07-30 NOTE — Progress Notes (Signed)
Patient ID: Jeffrey [name], male   DOB: 06-20-1940, 74 y.o.   MRN: [id number]   Place of Service: [facility]  Allergies  Allergen Reactions  . Niacin Other (See Comments)    REACTION: intolerant,not allergic="flushing,hot flashes,turning red"    Code Status: Full Code  Goals of Care: Longevity/STR  Chief Complaint  Patient presents with  . Medical Management of Chronic Issues    right hemiparesis s/p CVA, diplopia, CAD, GERD, DM2, HTN, BPh    HPI 74 y.o. male with PMH of CAD, BPH, tobacco abuse, DM2 with insulin pump, HLD, gout, HTN, right hemiparesis s/p CVA among others is being seen for a follow-up visit for management of his chronic medical issues. Has 12 lbs weight loss over the past 30 days. No fall or skin concerns reported. No change in behavior or functional status reported. Per PT/OT, patient is making good progress in therapy, but still needs contact guard with transfer and moderate cue for safety and lack of attention to his right side. BP range 120-150s/70-80s with most readings in 140-150s/70-80s. His BP goal is <130/80. HR 70-96. CBG range 130s-320s. Diplopia improves with prism trial. GERD stable on PPI. BPH stable on flomax. No issues with constipation on current regimen. Seen in room today. Denies any concerns.   Review of Systems Constitutional: Negative for fever, chills, and fatigue. HENT: Negative for ear pain, congestion, and sore throat Eyes: Negative for eye pain, eye discharge. Positive for double vision (improving)  Cardiovascular: Negative for chest pain, palpitations, and leg swelling Respiratory: Negative cough, shortness of breath, and wheezing.  Gastrointestinal: Negative for nausea and vomiting. Negative for abdominal pain.  Genitourinary: Negative for  dysuria, frequency, urgency, and hematuria Endocrine: Negative for polydipsia, polyphagia, and polyuria Musculoskeletal: Negative uncontrolled pain.  Neurological: Negative for dizziness  and headache. Positive for right-sided weakness  Skin: Negative for rash and wound.   Psychiatric: Negative for depression  Past Medical History  Diagnosis Date  . CAD (coronary artery disease)     PCI distal RCA ...2004, residual 70% LAD   /   ...nuclear...03/2007...no ischemia.Marland KitchenMarland Kitchenpreserved LV /  nuclear...03/03/2009...inferior scar..no ischemia..EF 51%  . Dyslipidemia     takes Atorvastatin daily  . Internal hemorrhoids   . Gout     takes Allopurinol daily and Colchicine as needed  . Seasonal allergic rhinitis   . Right carotid bruit   . Cyst of nasopharynx     per ENT Wilburn Cornelia  . Myocardial infarction 106yrs ago  . Peripheral edema     takes Lasix daily  . HTN (hypertension)     takes Cardura,Metoprolol,[name],and Amlodipine daily  . Arthritis   . GERD (gastroesophageal reflux disease)     takes Omeprazole daily  . History of colon polyps   . Type 2 diabetes, uncontrolled, with neuropathy 1989    takes Invokana daily and has an insulin pump (Dr. [name])  . 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 Cataract extraction Bilateral 2010  . Elbow surgery Right 1997  . Balloon angioplasty, artery  1992, 2004    CAD, Dr. Ron Parker  . Colonoscopy  04/03/2002    adenomatous polyp, int hemorrhoids  . Colonoscopy  07/18/2007    normal (Dr. Fuller Plan)  . Rotator cuff repair  09/2011    left, with subacromial decompression  . Nasal septum surgery    . Colonoscopy  09/26/2012  tubular adenoma, sm int hem, rpt 5 yrs Fuller Plan)  . Tonsillectomy and adenoidectomy    . Eye lids raised    . Cardiac catheterization  2004  . Polypectomy N/A 05/27/2013    Procedure: ENDOSCOPIC NASOPHARYNGEAL MASS;  Surgeon: [name], MD    History  Substance Use Topics  . Smoking status: Former Research scientist (life sciences)  . Smokeless tobacco: Never Used     Comment: quit smoking 45 years ago.  . Alcohol Use: No    Family  History  Problem Relation Age of Onset  . Alcohol abuse Father   . Coronary artery disease Neg Hx   . Stroke Neg Hx   . Cancer Neg Hx   . Diabetes Neg Hx   . Colon cancer Neg Hx       Medication List       This list is accurate as of: 07/30/14  1:07 PM.  Always use your most recent med list.               atorvastatin 40 MG tablet  Commonly known as:  LIPITOR  Take 1 tablet (40 mg total) by mouth daily.     docusate sodium 100 MG capsule  Commonly known as:  COLACE  Take 100 mg by mouth 2 (two) times daily.     insulin glargine 100 UNIT/ML injection  Commonly known as:  LANTUS  Inject 17 Units into the skin at bedtime.     insulin lispro 100 UNIT/ML injection  Commonly known as:  HUMALOG  - Inject 5-10 Units into the skin 3 (three) times daily before meals. Cbg: 150-200 5 units  - 201-300 8 units  - 301-400 10 units     LACTOBACILLUS PO  Take 1 g by mouth 3 (three) times daily.     levalbuterol 0.31 MG/3ML nebulizer solution  Commonly known as:  XOPENEX  Take 1 ampule by nebulization every 6 (six) hours as needed for wheezing.     metoprolol tartrate 25 MG tablet  Commonly known as:  LOPRESSOR  Take 25 mg by mouth 2 (two) times daily.     pantoprazole 40 MG tablet  Commonly known as:  PROTONIX  Take 40 mg by mouth daily.     polyethylene glycol packet  Commonly known as:  MIRALAX / GLYCOLAX  Take 17 g by mouth daily as needed for mild constipation or moderate constipation.     sennosides-docusate sodium 8.6-50 MG tablet  Commonly known as:  SENOKOT-S  Take 2 tablets by mouth daily.     tamsulosin 0.4 MG Caps capsule  Commonly known as:  FLOMAX  Take 1 capsule (0.4 mg total) by mouth daily.        Physical Exam  BP 148/84 mmHg  Pulse 79  Temp(Src) 97 F (36.1 C)  Resp 17  Ht 5\' 10"  (1.778 m)  Wt 167 lb 9.6 oz (76.023 kg)  BMI 24.05 kg/m2  Constitutional: WDWN elderly male in no acute distress. Conversant and pleasant HEENT: Normocephalic  and atraumatic. PERRL. EOM intact. No icterus. Oral mucosa moist. Posterior pharynx clear of any exudate or lesions. Teeth and gingiva in good general condition.  Neck: Supple and nontender. No lymphadenopathy, masses, or thyromegaly. No JVD or carotid bruits. Cardiac: Normal S1, S1. RRR. No appreciable murmurs, rubs, or gallops. Distal pulses intact. Trace dependent edema.  Respiratory: Unlabored respiration. Breath sounds clear bilaterally without rales, rhonchi, or wheezes. GI: Audible bowel sounds in all quadrants. Soft, nontender, nondistended.  Musculoskeletal: Right hemiparesis.  Skin: Warm and dry. No rash noted. No erythema.  Neurological: Alert and oriented to self.  Psychiatric: Judgment and insight adequate. Appropriate mood and affect.   Labs Reviewed  CBC Latest Ref Rng 07/14/2014 06/12/2014 06/09/2014  WBC - 6.6 8.3 9.7  Hemoglobin 13.5 - 17.5 g/dL 12.9(A) 14.1 12.7(L)  Hematocrit 41 - 53 % 38(A) 40.8 37.6(L)  Platelets 150 - 399 K/L 146(A) 199 113(L)    CMP Latest Ref Rng 07/14/2014 06/12/2014 06/11/2014  Glucose 70 - 99 mg/dL - 166(H) 155(H)  BUN 4 - 21 mg/dL 12 11 11   Creatinine 0.6 - 1.3 mg/dL 1.0 0.81 0.80  Sodium 137 - 147 mmol/L 141 136 137  Potassium 3.4 - 5.3 mmol/L 4.6 3.7 3.5  Chloride 96 - 112 mmol/L - 103 103  CO2 19 - 32 mmol/L - 23 26  Calcium 8.4 - 10.5 mg/dL - 8.4 8.5  Total Protein 6.0 - 8.3 g/dL - 5.4(L) -  Albumin - - - -  Total Bilirubin 0.3 - 1.2 mg/dL - 1.2 -  Alkaline Phos 39 - 117 U/L - 105 -  AST 0 - 37 U/L - 25 -  ALT 0 - 53 U/L - 29 -    Lab Results  Component Value Date   HGBA1C 7.8* 04/29/2014   Lipid Panel     Component Value Date/Time   CHOL 168 06/02/2014 0810   CHOL 211 02/01/2011   TRIG 316* 06/02/2014 0810   TRIG 342 09/30/2013   TRIG 342 09/30/2013   TRIG 522 02/01/2011   TRIG 522 02/01/2011   HDL 29* 06/02/2014 0810   CHOLHDL 5.8 06/02/2014 0810   VLDL 63* 06/02/2014 0810   LDLCALC 76 06/02/2014 0810   LDLCALC 98  09/30/2013   LDLCALC 98 09/30/2013   LDLDIRECT 89.6 02/07/2012 1457    Diagnostic Studies Reviewed 05/31/14: MRI head 1. Left thalamic hemorrhage probably represents hemorrhagic transformation of a left thalamic and posterior corona radiata infarct. Intra-axial hemorrhage, edema, and mild mass effect have not significantly changed. 2. Increased intraventricular and bilateral subarachnoid hemorrhage since yesterday. No ventriculomegaly. 3. Negative intracranial MRA. 4. Underlying mild for age nonspecific signal changes compatible with chronic small vessel disease. Study discussed by telephone with Neurology Dr. Wallie Char on 05/31/2014 at 1626 hours.  06/05/14: CT abdomen RIGHT lower lobe pneumonia with moderate-sized pleural effusion. Gallstones. No signs of acute cholecystitis. Mild ileus without bowel obstruction. Rectal tube good position.  06/13/14: EKG: Sinus tachycardia  06/19/14: 2D Echo - Left ventricle: The cavity size was normal. Wall thickness was normal. Systolic function was normal. The estimated ejection fraction was in the range of 55% to 60%. Regional wall motion abnormalities cannot be excluded. Doppler parameters are consistent with abnormal left ventricular relaxation (grade 1 diastolic dysfunction).  06/23/14: CXR Hazy airspace disease in the posterior lower lobe on the lateral view which may reflect atelectasis versus pneumonia. No significant interval change  Assessment & Plan 1. Nontraumatic intracranial hemorrhage With right hemiparesis. Off of plavix. Continue to work with PT/OT for gait/strength/balance training to restore/maximize functions. Fall risk precautions. Continue statin.    2. GERD No issues. Continue protonix 40mg  daily.   3. Slow transit constipation Stable. Continue colace 100mg  twice daily with senokot-s 2 tabs daily and miralax 17g daily as needed. Encourage hydration and ambulation as tolerated.   4. HLD (hyperlipidemia) LDL 76.  Continue lipitor 40mg  daily  5. BPH (benign prostatic hyperplasia) Stable. Continue flomax 0.4mg  daily and monitor  6. Type 2 diabetes mellitus  with peripheral neuropathy Last a1c 7.8. CBG range 130s-320s. Increase lantus to 17 units with ssi. Recheck a1c. Continue to monitor cbgs and his status. Continue statin   7. Coronary artery disease involving native coronary artery of native heart without angina pectoris S/p balloon angioplasty. Denies any chest pain. Continue statin. Monitor clinically  8. HTN Bp goal <130/80. BP range 120-150s/60-80s with most sbps in 140-150s. Will increase lopressor to 25mg  twice daily. Hold if bp <100/60 or HR <60. Reassess and continue to monitor.   9. Right hemiparesis  See #1  10. Weight loss 12 lbs weight loss over the past 30 days. Most likely secondary to increased activity during rehab as his appetite has been good. Continue to monitor for now.   11. Diplopia Improving. Continue prism trial and f/u with optometry/opthalmology as scheduled.   Diagnostic Studies/Labs Ordered: A1C next lab draw  JAARS of care discussed with patient and nursing staff. Patient and nursing staff verbalized understanding and agree with plan of care. No additional questions or concerns reported.    Arthur Holms, MSN, AGNP-C [facility] [address] [place], [place] [address] [phone] [8am-5pm] After hours: [phone]

## 2014-08-07 ENCOUNTER — Non-Acute Institutional Stay (SKILLED_NURSING_FACILITY): Payer: Medicare Other | Admitting: Internal Medicine

## 2014-08-07 DIAGNOSIS — E1165 Type 2 diabetes mellitus with hyperglycemia: Secondary | ICD-10-CM

## 2014-08-07 DIAGNOSIS — IMO0002 Reserved for concepts with insufficient information to code with codable children: Secondary | ICD-10-CM

## 2014-08-07 DIAGNOSIS — M6249 Contracture of muscle, multiple sites: Secondary | ICD-10-CM | POA: Diagnosis not present

## 2014-08-07 DIAGNOSIS — F410 Panic disorder [episodic paroxysmal anxiety] without agoraphobia: Secondary | ICD-10-CM

## 2014-08-07 DIAGNOSIS — M62838 Other muscle spasm: Secondary | ICD-10-CM

## 2014-08-07 NOTE — Progress Notes (Signed)
Patient ID: Jeffrey [name], male   DOB: 02/20/1941, 74 y.o.   MRN: [id number]    [facility]  PCP: Jeffrey Bush, MD  Code Status:  Full Code  Allergies  Allergen Reactions  . Niacin Other (See Comments)    REACTION: intolerant,not allergic="flushing,hot flashes,turning red"    Chief Complaint  Patient presents with  . Acute Visit    Anxiety and muscle spasms      HPI:  74 year old patient is here for short term rehabilitation post CVA. He is seen in his room with his wife present. He is concerned about being anxious and agitated yesterday- he remembers some of it. As per wife and staff, he calmed down after a dose of ativan was administered. He is now on ativan 0.5 mg bid prn for anxiety. He is calm today and at his baseline. Wife and pt are concerned if this recurs. As per therapy team, they have noticed increased tone and spasticity in his right arm which is limiting his therapy participation some. Nursing is concerned about his cbg being elevated. On review, cbg in may 2016 mostly in 169-272 range. Patient compliant with facility diet   Review of Systems:  Constitutional: Negative for fever, chills, diaphoresis.  HENT: Negative for headache, congestion Eyes: Negative for eye pain, blurred vision, double vision and discharge.  Respiratory: Negative for cough, shortness of breath and wheezing.   Cardiovascular: Negative for chest pain, palpitations, leg swelling.  Gastrointestinal: Negative for heartburn, nausea, vomiting, abdominal pain Musculoskeletal: muscle spasm and right sided weakness Skin: Negative for itching, sores and rash.  Psychiatric/Behavioral: Negative for depression   Past Medical History  Diagnosis Date  . CAD (coronary artery disease)     PCI distal RCA ...2004, residual 70% LAD   /   ...nuclear...03/2007...no ischemia.Marland KitchenMarland Kitchenpreserved LV /  nuclear...03/03/2009...inferior scar..no ischemia..EF 51%  . Dyslipidemia     takes Atorvastatin  daily  . Internal hemorrhoids   . Gout     takes Allopurinol daily and Colchicine as needed  . Seasonal allergic rhinitis   . Right carotid bruit   . Cyst of nasopharynx     per ENT Jeffrey [name]  . Myocardial infarction 29yrs ago  . Peripheral edema     takes Lasix daily  . HTN (hypertension)     takes Cardura,Metoprolol,[name],and Amlodipine daily  . Arthritis   . GERD (gastroesophageal reflux disease)     takes Omeprazole daily  . History of colon polyps   . Type 2 diabetes, uncontrolled, with neuropathy 1989    takes Invokana daily and has an insulin pump (Dr. Louanna [name])  . 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 Cataract extraction Bilateral 2010  . Elbow surgery Right 1997  . Balloon angioplasty, artery  1992, 2004    CAD, Dr. Ron Parker  . Colonoscopy  04/03/2002    adenomatous polyp, int hemorrhoids  . Colonoscopy  07/18/2007    normal (Dr. Fuller Plan)  . Rotator cuff repair  09/2011    left, with subacromial decompression  . Nasal septum surgery    . Colonoscopy  09/26/2012    tubular adenoma, sm int hem, rpt 5 yrs Fuller Plan)  . Tonsillectomy and adenoidectomy    . Eye lids raised    . Cardiac catheterization  2004  . Polypectomy N/A 05/27/2013    Procedure: ENDOSCOPIC NASOPHARYNGEAL MASS;  Surgeon: Jeffrey Belfast, MD  Social History:   reports that he has quit smoking. He has never used smokeless tobacco. He reports that he does not drink alcohol or use illicit drugs.  Family History  Problem Relation Age of Onset  . Alcohol abuse Father   . Coronary artery disease Neg Hx   . Stroke Neg Hx   . Cancer Neg Hx   . Diabetes Neg Hx   . Colon cancer Neg Hx     Medications: Patient's Medications  New Prescriptions   No medications on file  Previous Medications   ALBUTEROL (ACCUNEB) 0.63 MG/3ML NEBULIZER SOLUTION    Take 1 ampule by nebulization every 6 (six) hours as needed for  wheezing.   ATORVASTATIN (LIPITOR) 40 MG TABLET    Take 1 tablet (40 mg total) by mouth daily.   DOCUSATE SODIUM (COLACE) 100 MG CAPSULE    Take 100 mg by mouth 2 (two) times daily.    INSULIN GLARGINE (LANTUS) 100 UNIT/ML INJECTION    Inject 17 Units into the skin at bedtime.    INSULIN LISPRO (HUMALOG) 100 UNIT/ML INJECTION    Inject 5-10 Units into the skin 3 (three) times daily before meals. Cbg: 150-200 5 units 201-300 8 units 301-400 10 units   LACTOBACILLUS PO    Take 1 g by mouth 3 (three) times daily.   LORAZEPAM (ATIVAN) 0.5 MG TABLET    Take 0.5 mg by mouth 2 (two) times daily as needed for anxiety.   METOPROLOL TARTRATE (LOPRESSOR) 25 MG TABLET    Take 25 mg by mouth 2 (two) times daily. Hold if B/P < 100/60 or HR< 60   PANTOPRAZOLE (PROTONIX) 40 MG TABLET    Take 40 mg by mouth daily.    POLYETHYLENE GLYCOL (MIRALAX / GLYCOLAX) PACKET    Take 17 g by mouth daily as needed for mild constipation or moderate constipation.   SENNOSIDES-DOCUSATE SODIUM (SENOKOT-S) 8.6-50 MG TABLET    Take 2 tablets by mouth daily.    TAMSULOSIN (FLOMAX) 0.4 MG CAPS CAPSULE    Take 1 capsule (0.4 mg total) by mouth daily.  Modified Medications   No medications on file  Discontinued Medications   LEVALBUTEROL (XOPENEX) 0.31 MG/3ML NEBULIZER SOLUTION    Take 1 ampule by nebulization every 6 (six) hours as needed for wheezing.     Physical Exam: Filed Vitals:   08/07/14 1507  BP: 140/84  Pulse: 77  Temp: 96.4 F (35.8 C)  TempSrc: Oral  Resp: 18   General- elderly male, well built, in no acute distress Head- normocephalic, atraumatic Throat- moist mucus membrane   Eyes- PERRLA, EOMI, no pallor, no icterus, no discharge, normal conjunctiva, normal sclera Neck- no cervical lymphadenopathy Cardiovascular- normal s1,s2, no murmurs, palpable dorsalis pedis and radial pulses, right leg edema present Respiratory- bilateral clear to auscultation, no wheeze, no rhonchi, no crackles, no use of  accessory muscles Abdomen- bowel sounds present, soft, non tender Musculoskeletal- able to move all 4 extremities, right sided weakness present with strength 4/5, increased muscle tone on right arm  Neurological- normal speech, right sided weakness present Skin- warm and dry Psychiatry- alert and oriented to person, place and time, normal mood and affect    Labs reviewed: Basic Metabolic Panel:  Recent Labs  06/06/14 0402  06/10/14 0733 06/11/14 0531 06/12/14 0631 07/14/14  NA 144  < > 139 137 136 141  K 3.6  < > 3.2* 3.5 3.7 4.6  CL 115*  < > 106 103 103  --  CO2 22  < > 27 26 23   --   GLUCOSE 134*  < > 133* 155* 166*  --   BUN 28*  < > 12 11 11 12   CREATININE 1.11  < > 0.81 0.80 0.81 1.0  CALCIUM 9.1  < > 8.3* 8.5 8.4  --   MG 2.2  --  2.0  --   --   --   PHOS 2.5  --   --   --   --   --   < > = values in this interval not displayed. Liver Function Tests:  Recent Labs  06/04/14 0602 06/10/14 0733 06/12/14 0631  AST 19 17 25   ALT 32 24 29  ALKPHOS 96 93 105  BILITOT 2.1* 1.2 1.2  PROT 5.9* 5.3* 5.4*  ALBUMIN 3.2* 2.3* 2.5*   No results for input(s): LIPASE, AMYLASE in the last 8760 hours. No results for input(s): AMMONIA in the last 8760 hours. CBC:  Recent Labs  05/31/14 1515 06/04/14 0602  06/07/14 0417 06/09/14 0339 06/12/14 0631 07/14/14  WBC 10.1 15.3*  < > 9.4 9.7 8.3 6.6  NEUTROABS 7.6 13.2*  --   --   --  6.8  --   HGB 14.8 15.7  < > 12.8* 12.7* 14.1 12.9*  HCT 44.7 46.0  < > 38.6* 37.6* 40.8 38*  MCV 92.5 90.2  < > 90.6 88.7 88.5  --   PLT 143* 120*  < > 101* 113* 199 146*  < > = values in this interval not displayed. Cardiac Enzymes:  Recent Labs  06/05/14 1316 06/05/14 1710 06/06/14 0019  TROPONINI 1.25* 1.56* 0.96*   BNP: Invalid input(s): POCBNP CBG:  Recent Labs  06/24/14 1619 06/24/14 2044 06/25/14 0737  GLUCAP 185* 216* 172*    Lab Results  Component Value Date   HGBA1C 7.8* 04/29/2014   07/31/14 a1c  8.2   Assessment/Plan  Anxiety Likely situational with him being at multiple appointment, travelling. On ativan 0.5 mg bid prn for now. Monitor clinically, if recurs, consider anxiolytics  Muscle spasm Post cva, add baclofen 5 mg daily for now, can increase to bid if needed. Continue working with PT/OT  Uncontrolled DM Reviewed a1c s/o worsening DM. Increase lantus to 20 u daily. Change humalog to 5 u for cbg 150-200, 8 u for 201-250 and 10 u for 251-300 with 12 u for > 300. Reassess with monitoring of cbg  Family/ staff Communication: reviewed care plan with patient, his wife and nursing supervisor    Blanchie Serve, MD  [place] [phone] (Monday-Friday 8 am - 5 pm) [phone] (afterhours)

## 2014-08-13 ENCOUNTER — Non-Acute Institutional Stay (SKILLED_NURSING_FACILITY): Payer: Medicare Other | Admitting: Nurse Practitioner

## 2014-08-13 DIAGNOSIS — F411 Generalized anxiety disorder: Secondary | ICD-10-CM

## 2014-08-13 NOTE — Progress Notes (Signed)
Patient ID: Jeffrey [name], male   DOB: [birth year], 74 y.o.   MRN: [id number]    [facility]  PCP: [name], MD  Code Status:  Full Code  Allergies  Allergen Reactions  . Niacin Other (See Comments)    REACTION: intolerant,not allergic="flushing,hot flashes,turning red"    Chief Complaint  Patient presents with  . Acute Visit    anxiety      HPI:  74 year old patient is here for short term rehabilitation post CVA. He is seen in his room with his wife present. Pt was seen last week due to anxiety and ativan was added as needed, wife would like this scheduled, pt calling her nightly regarding the medications. He agrees with some anxieties regarding being at the facility and missing home. Mostly at night. Denies depression.   Review of Systems:  Constitutional: Negative for fever, chills, diaphoresis.  HENT: Negative for headache, congestion Eyes: Negative for eye pain, blurred vision, double vision and discharge.  Respiratory: Negative for cough, shortness of breath and wheezing.   Cardiovascular: Negative for chest pain, palpitations, leg swelling.  Gastrointestinal: Negative for heartburn, nausea, vomiting, abdominal pain Musculoskeletal: muscle spasm and right sided weakness Skin: Negative for itching, sores and rash.  Psychiatric/Behavioral: Negative for depression, agrees with some anxiety    Past Medical History  Diagnosis Date  . CAD (coronary artery disease)     PCI distal RCA ...2004, residual 70% LAD   /   ...nuclear...03/2007...no ischemia.Marland KitchenMarland Kitchenpreserved LV /  nuclear...03/03/2009...inferior scar..no ischemia..EF 51%  . Dyslipidemia     takes Atorvastatin daily  . Internal hemorrhoids   . Gout     takes Allopurinol daily and Colchicine as needed  . Seasonal allergic rhinitis   . Right carotid bruit   . Cyst of nasopharynx     per ENT Wilburn Cornelia  . Myocardial infarction 64yrs ago  . Peripheral edema     takes Lasix daily  . HTN  (hypertension)     takes Cardura,Metoprolol,[name],and Amlodipine daily  . Arthritis   . GERD (gastroesophageal reflux disease)     takes Omeprazole daily  . History of colon polyps   . Type 2 diabetes, uncontrolled, with neuropathy 1989    takes Invokana daily and has an insulin pump (Dr. [name])  . 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 Cataract extraction Bilateral 2010  . Elbow surgery Right 1997  . Balloon angioplasty, artery  1992, 2004    CAD, Dr. Ron Parker  . Colonoscopy  04/03/2002    adenomatous polyp, int hemorrhoids  . Colonoscopy  07/18/2007    normal (Dr. Fuller Plan)  . Rotator cuff repair  09/2011    left, with subacromial decompression  . Nasal septum surgery    . Colonoscopy  09/26/2012    tubular adenoma, sm int hem, rpt 5 yrs Fuller Plan)  . Tonsillectomy and adenoidectomy    . Eye lids raised    . Cardiac catheterization  2004  . Polypectomy N/A 05/27/2013    Procedure: ENDOSCOPIC NASOPHARYNGEAL MASS;  Surgeon: [name], MD   Social History:   reports that he has quit smoking. He has never used smokeless tobacco. He reports that he does not drink alcohol or use illicit drugs.  Family History  Problem Relation Age of Onset  . Alcohol abuse Father   . Coronary artery disease Neg Hx   . Stroke Neg  Hx   . Cancer Neg Hx   . Diabetes Neg Hx   . Colon cancer Neg Hx     Medications: Patient's Medications  New Prescriptions   No medications on file  Previous Medications   ALBUTEROL (ACCUNEB) 0.63 MG/3ML NEBULIZER SOLUTION    Take 1 ampule by nebulization every 6 (six) hours as needed for wheezing.   ATORVASTATIN (LIPITOR) 40 MG TABLET    Take 1 tablet (40 mg total) by mouth daily.   DOCUSATE SODIUM (COLACE) 100 MG CAPSULE    Take 100 mg by mouth 2 (two) times daily.    INSULIN GLARGINE (LANTUS) 100 UNIT/ML INJECTION    Inject 17 Units into the skin at bedtime.     INSULIN LISPRO (HUMALOG) 100 UNIT/ML INJECTION    Inject 5-10 Units into the skin 3 (three) times daily before meals. Cbg: 150-200 5 units 201-300 8 units 301-400 10 units   LACTOBACILLUS PO    Take 1 g by mouth 3 (three) times daily.   LORAZEPAM (ATIVAN) 0.5 MG TABLET    Take 0.5 mg by mouth 2 (two) times daily as needed for anxiety.   METOPROLOL TARTRATE (LOPRESSOR) 25 MG TABLET    Take 25 mg by mouth 2 (two) times daily. Hold if B/P < 100/60 or HR< 60   PANTOPRAZOLE (PROTONIX) 40 MG TABLET    Take 40 mg by mouth daily.    POLYETHYLENE GLYCOL (MIRALAX / GLYCOLAX) PACKET    Take 17 g by mouth daily as needed for mild constipation or moderate constipation.   SENNOSIDES-DOCUSATE SODIUM (SENOKOT-S) 8.6-50 MG TABLET    Take 2 tablets by mouth daily.    TAMSULOSIN (FLOMAX) 0.4 MG CAPS CAPSULE    Take 1 capsule (0.4 mg total) by mouth daily.  Modified Medications   No medications on file  Discontinued Medications   No medications on file     Physical Exam: Filed Vitals:   08/13/14 1434  BP: 150/86  Pulse: 77  Temp: 98.6 F (37 C)  Resp: 20   General- elderly male, well built, in no acute distress Head- normocephalic, atraumatic Cardiovascular- normal s1,s2, no murmurs, palpable dorsalis pedis and radial pulses, right leg edema present Respiratory- bilateral clear to auscultation Neurological- normal speech, right sided weakness present Skin- warm and dry Psychiatry- alert and oriented to person, place and time, normal mood and affect    Labs reviewed: Basic Metabolic Panel:  Recent Labs  06/06/14 0402  06/10/14 0733 06/11/14 0531 06/12/14 0631 07/14/14  NA 144  < > 139 137 136 141  K 3.6  < > 3.2* 3.5 3.7 4.6  CL 115*  < > 106 103 103  --   CO2 22  < > 27 26 23   --   GLUCOSE 134*  < > 133* 155* 166*  --   BUN 28*  < > 12 11 11 12   CREATININE 1.11  < > 0.81 0.80 0.81 1.0  CALCIUM 9.1  < > 8.3* 8.5 8.4  --   MG 2.2  --  2.0  --   --   --   PHOS 2.5  --   --   --   --    --   < > = values in this interval not displayed. Liver Function Tests:  Recent Labs  06/04/14 0602 06/10/14 0733 06/12/14 0631  AST 19 17 25   ALT 32 24 29  ALKPHOS 96 93 105  BILITOT 2.1* 1.2 1.2  PROT 5.9* 5.3* 5.4*  ALBUMIN 3.2* 2.3* 2.5*   No results for input(s): LIPASE, AMYLASE in the last 8760 hours. No results for input(s): AMMONIA in the last 8760 hours. CBC:  Recent Labs  05/31/14 1515 06/04/14 0602  06/07/14 0417 06/09/14 0339 06/12/14 0631 07/14/14  WBC 10.1 15.3*  < > 9.4 9.7 8.3 6.6  NEUTROABS 7.6 13.2*  --   --   --  6.8  --   HGB 14.8 15.7  < > 12.8* 12.7* 14.1 12.9*  HCT 44.7 46.0  < > 38.6* 37.6* 40.8 38*  MCV 92.5 90.2  < > 90.6 88.7 88.5  --   PLT 143* 120*  < > 101* 113* 199 146*  < > = values in this interval not displayed. Cardiac Enzymes:  Recent Labs  06/05/14 1316 06/05/14 1710 06/06/14 0019  TROPONINI 1.25* 1.56* 0.96*   BNP: Invalid input(s): POCBNP CBG:  Recent Labs  06/24/14 1619 06/24/14 2044 06/25/14 0737  GLUCAP 185* 216* 172*    Lab Results  Component Value Date   HGBA1C 7.8* 04/29/2014   07/31/14 a1c 8.2   Assessment/Plan  Anxiety situational with him being at rehab facility, ready to go home however wife reports home has to be set up so it is safe for him to leave.calling her at night about the ativan, would like this scheduled routinely. Will decrease ativan to 0.25 mg qhs and schedule- will dc ativan 0.5 mg bid at this time

## 2014-08-14 ENCOUNTER — Other Ambulatory Visit: Payer: Self-pay

## 2014-08-14 MED ORDER — LORAZEPAM 0.5 MG PO TABS: ORAL_TABLET | ORAL | Status: DC

## 2014-08-14 NOTE — Telephone Encounter (Signed)
Rx faxed to [facility] @ [phone], phone number [phone]  

## 2014-08-18 ENCOUNTER — Non-Acute Institutional Stay (SKILLED_NURSING_FACILITY): Payer: Medicare Other | Admitting: Nurse Practitioner

## 2014-08-18 DIAGNOSIS — H938X2 Other specified disorders of left ear: Secondary | ICD-10-CM

## 2014-08-18 NOTE — Progress Notes (Signed)
Patient ID: Jeffrey [name], male   DOB: [birth year], 74 y.o.   MRN: [id number]    [facility]  PCP: [name], MD  Code Status:  Full Code  Allergies  Allergen Reactions  . Niacin Other (See Comments)    REACTION: intolerant,not allergic="flushing,hot flashes,turning red"    Chief Complaint  Patient presents with  . Acute Visit     HPI:  74 year old patient is here for short term rehabilitation post CVA. He is seen today due to fullness in left ear. Wife worried something is in there such as wax build up. Denies pain,or drainage. Sounds are sometimes muffled  but overall no changes in hearing. No fevers or chills noted.    Review of Systems:  Constitutional: Negative for fever, chills, diaphoresis.  HENT: Negative for headache, congestion, no ear pain. Eyes: Negative for eye pain, blurred vision, double vision and discharge.  Respiratory: Negative for cough, shortness of breath and wheezing.   Cardiovascular: Negative for chest pain, palpitations, leg swelling.  Gastrointestinal: Negative for heartburn, nausea, vomiting, abdominal pain Musculoskeletal: muscle spasm and right sided weakness Skin: Negative for itching, sores and rash.  Psychiatric/Behavioral: Negative for depression, agrees with some anxiety    Past Medical History  Diagnosis Date  . CAD (coronary artery disease)     PCI distal RCA ...2004, residual 70% LAD   /   ...nuclear...03/2007...no ischemia.Marland KitchenMarland Kitchenpreserved LV /  nuclear...03/03/2009...inferior scar..no ischemia..EF 51%  . Dyslipidemia     takes Atorvastatin daily  . Internal hemorrhoids   . Gout     takes Allopurinol daily and Colchicine as needed  . Seasonal allergic rhinitis   . Right carotid bruit   . Cyst of nasopharynx     per ENT Wilburn Cornelia  . Myocardial infarction 41yrs ago  . Peripheral edema     takes Lasix daily  . HTN (hypertension)     takes Cardura,Metoprolol,[name],and Amlodipine daily  . Arthritis   .  GERD (gastroesophageal reflux disease)     takes Omeprazole daily  . History of colon polyps   . Type 2 diabetes, uncontrolled, with neuropathy 1989    takes Invokana daily and has an insulin pump (Dr. [name])  . 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 Cataract extraction Bilateral 2010  . Elbow surgery Right 1997  . Balloon angioplasty, artery  1992, 2004    CAD, Dr. Ron Parker  . Colonoscopy  04/03/2002    adenomatous polyp, int hemorrhoids  . Colonoscopy  07/18/2007    normal (Dr. Fuller Plan)  . Rotator cuff repair  09/2011    left, with subacromial decompression  . Nasal septum surgery    . Colonoscopy  09/26/2012    tubular adenoma, sm int hem, rpt 5 yrs Fuller Plan)  . Tonsillectomy and adenoidectomy    . Eye lids raised    . Cardiac catheterization  2004  . Polypectomy N/A 05/27/2013    Procedure: ENDOSCOPIC NASOPHARYNGEAL MASS;  Surgeon: [name], MD   Social History:   reports that he has quit smoking. He has never used smokeless tobacco. He reports that he does not drink alcohol or use illicit drugs.  Family History  Problem Relation Age of Onset  . Alcohol abuse Father   . Coronary artery disease Neg Hx   . Stroke Neg Hx   . Cancer Neg Hx   . Diabetes Neg Hx   .  Colon cancer Neg Hx     Medications: Patient's Medications  New Prescriptions   No medications on file  Previous Medications   ALBUTEROL (ACCUNEB) 0.63 MG/3ML NEBULIZER SOLUTION    Take 1 ampule by nebulization every 6 (six) hours as needed for wheezing.   ATORVASTATIN (LIPITOR) 40 MG TABLET    Take 1 tablet (40 mg total) by mouth daily.   DOCUSATE SODIUM (COLACE) 100 MG CAPSULE    Take 100 mg by mouth 2 (two) times daily.    INSULIN GLARGINE (LANTUS) 100 UNIT/ML INJECTION    Inject 17 Units into the skin at bedtime.    INSULIN LISPRO (HUMALOG) 100 UNIT/ML INJECTION    Inject 5-10 Units into the skin 3 (three)  times daily before meals. Cbg: 150-200 5 units 201-300 8 units 301-400 10 units   LACTOBACILLUS PO    Take 1 g by mouth 3 (three) times daily.   LORAZEPAM (ATIVAN) 0.5 MG TABLET    1/2 by mouth every night at bedtime schedule, HOLD FOR SEDATION   METOPROLOL TARTRATE (LOPRESSOR) 25 MG TABLET    Take 25 mg by mouth 2 (two) times daily. Hold if B/P < 100/60 or HR< 60   PANTOPRAZOLE (PROTONIX) 40 MG TABLET    Take 40 mg by mouth daily.    POLYETHYLENE GLYCOL (MIRALAX / GLYCOLAX) PACKET    Take 17 g by mouth daily as needed for mild constipation or moderate constipation.   SENNOSIDES-DOCUSATE SODIUM (SENOKOT-S) 8.6-50 MG TABLET    Take 2 tablets by mouth daily.    TAMSULOSIN (FLOMAX) 0.4 MG CAPS CAPSULE    Take 1 capsule (0.4 mg total) by mouth daily.  Modified Medications   No medications on file  Discontinued Medications   No medications on file     Physical Exam: Filed Vitals:   08/18/14 1630  BP: 145/81  Pulse: 66  Temp: 98.4 F (36.9 C)  Resp: 20   Physical Exam  Constitutional: He is well-developed, well-nourished, and in no distress.  HENT:  Head: Normocephalic and atraumatic.  Right Ear: Hearing, tympanic membrane, external ear and ear canal normal.  Left Ear: Hearing, tympanic membrane, external ear and ear canal normal.  Nose: Nose normal.  Mouth/Throat: Oropharynx is clear and moist. No oropharyngeal exudate.  Cardiovascular: Normal rate, regular rhythm and normal heart sounds.   Pulmonary/Chest: Effort normal and breath sounds normal.  Skin: Skin is warm and dry.      Labs reviewed: Basic Metabolic Panel:  Recent Labs  06/06/14 0402  06/10/14 0733 06/11/14 0531 06/12/14 0631 07/14/14  NA 144  < > 139 137 136 141  K 3.6  < > 3.2* 3.5 3.7 4.6  CL 115*  < > 106 103 103  --   CO2 22  < > 27 26 23   --   GLUCOSE 134*  < > 133* 155* 166*  --   BUN 28*  < > 12 11 11 12   CREATININE 1.11  < > 0.81 0.80 0.81 1.0  CALCIUM 9.1  < > 8.3* 8.5 8.4  --   MG 2.2  --   2.0  --   --   --   PHOS 2.5  --   --   --   --   --   < > = values in this interval not displayed. Liver Function Tests:  Recent Labs  06/04/14 0602 06/10/14 0733 06/12/14 0631  AST 19 17 25   ALT 32 24 29  ALKPHOS 96 93  105  BILITOT 2.1* 1.2 1.2  PROT 5.9* 5.3* 5.4*  ALBUMIN 3.2* 2.3* 2.5*   No results for input(s): LIPASE, AMYLASE in the last 8760 hours. No results for input(s): AMMONIA in the last 8760 hours. CBC:  Recent Labs  05/31/14 1515 06/04/14 0602  06/07/14 0417 06/09/14 0339 06/12/14 0631 07/14/14  WBC 10.1 15.3*  < > 9.4 9.7 8.3 6.6  NEUTROABS 7.6 13.2*  --   --   --  6.8  --   HGB 14.8 15.7  < > 12.8* 12.7* 14.1 12.9*  HCT 44.7 46.0  < > 38.6* 37.6* 40.8 38*  MCV 92.5 90.2  < > 90.6 88.7 88.5  --   PLT 143* 120*  < > 101* 113* 199 146*  < > = values in this interval not displayed. Cardiac Enzymes:  Recent Labs  06/05/14 1316 06/05/14 1710 06/06/14 0019  TROPONINI 1.25* 1.56* 0.96*   BNP: Invalid input(s): POCBNP CBG:  Recent Labs  06/24/14 1619 06/24/14 2044 06/25/14 0737  GLUCAP 185* 216* 172*    Lab Results  Component Value Date   HGBA1C 7.8* 04/29/2014   07/31/14 a1c 8.2   Assessment/Plan 1. Ear fullness, left Ear drum well visualized noted with fluid behind it, no pain to ear, no fevers noted. Will start Claritin 10 mg and monitor at this itme

## 2014-08-27 ENCOUNTER — Non-Acute Institutional Stay (SKILLED_NURSING_FACILITY): Payer: Medicare Other | Admitting: Nurse Practitioner

## 2014-08-27 DIAGNOSIS — R130 Aphagia: Secondary | ICD-10-CM

## 2014-08-27 DIAGNOSIS — H938X2 Other specified disorders of left ear: Secondary | ICD-10-CM

## 2014-08-27 DIAGNOSIS — F410 Panic disorder [episodic paroxysmal anxiety] without agoraphobia: Secondary | ICD-10-CM

## 2014-08-27 DIAGNOSIS — E1140 Type 2 diabetes mellitus with diabetic neuropathy, unspecified: Secondary | ICD-10-CM

## 2014-08-27 DIAGNOSIS — I612 Nontraumatic intracerebral hemorrhage in hemisphere, unspecified: Secondary | ICD-10-CM | POA: Diagnosis not present

## 2014-08-27 DIAGNOSIS — E1165 Type 2 diabetes mellitus with hyperglycemia: Secondary | ICD-10-CM | POA: Diagnosis not present

## 2014-08-27 DIAGNOSIS — IMO0002 Reserved for concepts with insufficient information to code with codable children: Secondary | ICD-10-CM

## 2014-08-27 DIAGNOSIS — I10 Essential (primary) hypertension: Secondary | ICD-10-CM

## 2014-08-27 DIAGNOSIS — G8190 Hemiplegia, unspecified affecting unspecified side: Secondary | ICD-10-CM | POA: Diagnosis not present

## 2014-08-27 DIAGNOSIS — G8191 Hemiplegia, unspecified affecting right dominant side: Secondary | ICD-10-CM

## 2014-08-27 NOTE — Progress Notes (Signed)
Patient ID: Jeffrey [name], male   DOB: 1941-01-13, 74 y.o.   MRN: [id number]    Nursing Home Location:  [place] of Service: SNF (31)  PCP: [name], MD  Allergies  Allergen Reactions  . Niacin Other (See Comments)    REACTION: intolerant,not allergic="flushing,hot flashes,turning red"    Chief Complaint  Patient presents with  . Discharge Note    HPI:  Patient is a 74 y.o. male seen today at [facility] for discharge home. Pt with a pmh of CAD, DM2 with insulin pump prior to CVA, HLD, HTN, BPH  Pt at [facility] place for short term rehabilitation post hospital admission from 06/11/14-06/25/14 with functional deficit post left thalamic intracerebral hemorrhage. CT showed acute left thalamic and left internal capsule parenchymal hemorrhage compatible with hypertensive hemorrhage. MRI showed similar finding. Carotid dopplers with no ICA stenosis. Echo showed EF 65% with grade 1 diastolic dysfunction. He was seen by neurology and conservative management planned. He was in inpatient rehabilitation prior to transfer to SNF. Pt going to see endocrinology today to discuss best course of treatment for uncontrolled diabetes once he is discharged home. Pt previously had insulin pump but due to CVA it is felt there are better options. Pt currently with SSI which in rehab.  Pt also noted to have increased anxiety about discharge home. He has the support of family and friends. Has had increased anxiety at night at the facility, ativan was added with good effect. Long term medication was discussed and pt does not wish to add medication for anxiety at this time.   Review of Systems:  Review of Systems  Constitutional: Negative for activity change, appetite change, fatigue and unexpected weight change.  HENT: Negative for congestion and hearing loss.   Eyes: Negative.   Respiratory: Negative for cough and shortness of breath.   Cardiovascular:  Negative for chest pain, palpitations and leg swelling.  Gastrointestinal: Negative for abdominal pain, diarrhea and constipation.  Genitourinary: Negative for dysuria and difficulty urinating.  Musculoskeletal: Negative for myalgias and arthralgias.  Skin: Negative for color change and wound.  Neurological: Positive for weakness (has improved). Negative for dizziness.  Psychiatric/Behavioral: Positive for confusion (at times). Negative for behavioral problems and agitation. The patient is nervous/anxious.     Past Medical History  Diagnosis Date  . CAD (coronary artery disease)     PCI distal RCA ...2004, residual 70% LAD   /   ...nuclear...03/2007...no ischemia.Marland KitchenMarland Kitchenpreserved LV /  nuclear...03/03/2009...inferior scar..no ischemia..EF 51%  . Dyslipidemia     takes Atorvastatin daily  . Internal hemorrhoids   . Gout     takes Allopurinol daily and Colchicine as needed  . Seasonal allergic rhinitis   . Right carotid bruit   . Cyst of nasopharynx     per ENT Wilburn Cornelia  . Myocardial infarction 47yrs ago  . Peripheral edema     takes Lasix daily  . HTN (hypertension)     takes Cardura,Metoprolol,[name],and Amlodipine daily  . Arthritis   . GERD (gastroesophageal reflux disease)     takes Omeprazole daily  . History of colon polyps   . Type 2 diabetes, uncontrolled, with neuropathy 1989    takes Invokana daily and has an insulin pump (Dr. [name])  . 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 Cataract extraction Bilateral 2010  . Elbow surgery Right  1997  . Balloon angioplasty, artery  1992, 2004    CAD, Dr. Ron Parker  . Colonoscopy  04/03/2002    adenomatous polyp, int hemorrhoids  . Colonoscopy  07/18/2007    normal (Dr. Fuller Plan)  . Rotator cuff repair  09/2011    left, with subacromial decompression  . Nasal septum surgery    . Colonoscopy  09/26/2012    tubular adenoma, sm int hem, rpt  5 yrs Fuller Plan)  . Tonsillectomy and adenoidectomy    . Eye lids raised    . Cardiac catheterization  2004  . Polypectomy N/A 05/27/2013    Procedure: ENDOSCOPIC NASOPHARYNGEAL MASS;  Surgeon: [name], MD   Social History:   reports that he has quit smoking. He has never used smokeless tobacco. He reports that he does not drink alcohol or use illicit drugs.  Family History  Problem Relation Age of Onset  . Alcohol abuse Father   . Coronary artery disease Neg Hx   . Stroke Neg Hx   . Cancer Neg Hx   . Diabetes Neg Hx   . Colon cancer Neg Hx     Medications: Patient's Medications  New Prescriptions   No medications on file  Previous Medications   ALBUTEROL (ACCUNEB) 0.63 MG/3ML NEBULIZER SOLUTION    Take 1 ampule by nebulization every 6 (six) hours as needed for wheezing.   ATORVASTATIN (LIPITOR) 40 MG TABLET    Take 1 tablet (40 mg total) by mouth daily.   BACLOFEN (LIORESAL) 10 MG TABLET    Take 5 mg by mouth daily. Daily in am, may have additional dose if needed for muscle spasm   DOCUSATE SODIUM (COLACE) 100 MG CAPSULE    Take 100 mg by mouth 2 (two) times daily.    INSULIN GLARGINE (LANTUS) 100 UNIT/ML INJECTION    Inject 17 Units into the skin at bedtime.    INSULIN LISPRO (HUMALOG) 100 UNIT/ML INJECTION    Inject 5-10 Units into the skin 3 (three) times daily before meals. Cbg: 150-200 5 units 201-300 8 units 301-400 10 units   LACTOBACILLUS PO    Take 1 g by mouth 3 (three) times daily.   LORAZEPAM (ATIVAN) 0.5 MG TABLET    1/2 by mouth every night at bedtime schedule, HOLD FOR SEDATION   METOPROLOL TARTRATE (LOPRESSOR) 25 MG TABLET    Take 25 mg by mouth 2 (two) times daily. Hold if B/P < 100/60 or HR< 60   PANTOPRAZOLE (PROTONIX) 40 MG TABLET    Take 40 mg by mouth daily.    POLYETHYLENE GLYCOL (MIRALAX / GLYCOLAX) PACKET    Take 17 g by mouth daily as needed for mild constipation or moderate constipation.   SENNOSIDES-DOCUSATE SODIUM (SENOKOT-S) 8.6-50 MG TABLET     Take 2 tablets by mouth daily.    TAMSULOSIN (FLOMAX) 0.4 MG CAPS CAPSULE    Take 1 capsule (0.4 mg total) by mouth daily.  Modified Medications   No medications on file  Discontinued Medications   No medications on file     Physical Exam: Filed Vitals:   08/27/14 1514  BP: 150/92  Pulse: 90  Temp: 98.9 F (37.2 C)  Resp: 18    Physical Exam  Constitutional: He is oriented to person, place, and time. He appears well-developed and well-nourished. No distress.  HENT:  Head: Normocephalic and atraumatic.  Mouth/Throat: Oropharynx is clear and moist. No oropharyngeal exudate.  Eyes: Conjunctivae and EOM are normal. Pupils are equal, round, and reactive to light.  Neck: Normal range of motion. Neck supple.  Cardiovascular: Normal rate, regular rhythm and normal heart sounds.   Pulmonary/Chest: Effort normal and breath sounds normal.  Abdominal: Soft. Bowel sounds are normal.  Musculoskeletal: He exhibits no edema or tenderness.  Right sided weakness  Neurological: He is alert and oriented to person, place, and time.  Skin: Skin is warm and dry. He is not diaphoretic.  Psychiatric: He has a normal mood and affect.    Labs reviewed: Basic Metabolic Panel:  Recent Labs  06/06/14 0402  06/10/14 0733 06/11/14 0531 06/12/14 0631 07/14/14  NA 144  < > 139 137 136 141  K 3.6  < > 3.2* 3.5 3.7 4.6  CL 115*  < > 106 103 103  --   CO2 22  < > 27 26 23   --   GLUCOSE 134*  < > 133* 155* 166*  --   BUN 28*  < > 12 11 11 12   CREATININE 1.11  < > 0.81 0.80 0.81 1.0  CALCIUM 9.1  < > 8.3* 8.5 8.4  --   MG 2.2  --  2.0  --   --   --   PHOS 2.5  --   --   --   --   --   < > = values in this interval not displayed. Liver Function Tests:  Recent Labs  06/04/14 0602 06/10/14 0733 06/12/14 0631  AST 19 17 25   ALT 32 24 29  ALKPHOS 96 93 105  BILITOT 2.1* 1.2 1.2  PROT 5.9* 5.3* 5.4*  ALBUMIN 3.2* 2.3* 2.5*   No results for input(s): LIPASE, AMYLASE in the last 8760  hours. No results for input(s): AMMONIA in the last 8760 hours. CBC:  Recent Labs  05/31/14 1515 06/04/14 0602  06/07/14 0417 06/09/14 0339 06/12/14 0631 07/14/14  WBC 10.1 15.3*  < > 9.4 9.7 8.3 6.6  NEUTROABS 7.6 13.2*  --   --   --  6.8  --   HGB 14.8 15.7  < > 12.8* 12.7* 14.1 12.9*  HCT 44.7 46.0  < > 38.6* 37.6* 40.8 38*  MCV 92.5 90.2  < > 90.6 88.7 88.5  --   PLT 143* 120*  < > 101* 113* 199 146*  < > = values in this interval not displayed. TSH:  Recent Labs  09/30/13  TSH 0.97  0.97   A1C: Lab Results  Component Value Date   HGBA1C 7.8* 04/29/2014   Lipid Panel:  Recent Labs  09/30/13 06/02/14 0810  CHOL 202*  202*  202* 168  HDL 35  35  35 29*  LDLCALC 98  98  98 76  TRIG 342*  342  342 316*  CHOLHDL  --  5.8      Assessment/Plan  1. Right hemiparesis Post CVA, working with therapy with improvements.   2. Aphagia -pt with trouble finding words at times, worse with increase anxiety, will have HH ST evaluate and trea  3. Essential hypertension Blood pressure staying consistently over goal of <140/90, will add losartan 50 mg PO daily due to CVA -family educated to take blood pressures and bring to follow up appt.   4. Type 2 diabetes, uncontrolled, with neuropathy Following with endocrine, discussed with staff and family that orders for diabetes management will come from endocrinologist.   5. Anxiety attack -cont on ativan nightly, may have additional dose on day of discharge if needed for increased anxiety  6. Ear fullness, left Improved  on Claritin   7. Nontraumatic hemorrhage of cerebral hemisphere With right hemiparesis and aphagia noted pt is stable for discharge-will need PT/OT/Nursing/HHA/SW per home health. DME needed includes WC with right arm support. Rx written.  will need to follow up with PCP within 2 weeks.    Carlos American. Harle Battiest  [facility] [phone] 8 am - 5  pm) [phone] (after hours)

## 2014-09-03 ENCOUNTER — Telehealth: Payer: Self-pay

## 2014-09-03 NOTE — Telephone Encounter (Signed)
Merry Proud @ [facility] wanted to get a1c  Also they need to verbal orders for skilled nursing to treat for diease management and med education Pt/ot/st  To evaluate and treat related to cva

## 2014-09-03 NOTE — Telephone Encounter (Signed)
Has f/u on Friday since not seen by me since 2014. [place] for verbal order for below. If he draws A1c will need to send [place] copy of results. Otherwise we can check labs when he comes in on Friday. Lab Results  Component Value Date   HGBA1C 7.8* 04/29/2014

## 2014-09-04 ENCOUNTER — Telehealth: Payer: Self-pay | Admitting: *Deleted

## 2014-09-04 ENCOUNTER — Encounter: Payer: Self-pay | Admitting: Family Medicine

## 2014-09-04 NOTE — Telephone Encounter (Signed)
Nurse with [facility] asking for pt's last A1C, I advised it has not been done yet, pt has appt on friday

## 2014-09-04 NOTE — Telephone Encounter (Signed)
Merry Proud notified and will hold off on labs since pt has follow up tomorrow.

## 2014-09-05 ENCOUNTER — Encounter: Payer: Self-pay | Admitting: Family Medicine

## 2014-09-05 ENCOUNTER — Ambulatory Visit (INDEPENDENT_AMBULATORY_CARE_PROVIDER_SITE_OTHER): Payer: Medicare Other | Admitting: Family Medicine

## 2014-09-05 VITALS — BP 132/58 | HR 69 | Temp 97.8°F

## 2014-09-05 DIAGNOSIS — E785 Hyperlipidemia, unspecified: Secondary | ICD-10-CM

## 2014-09-05 DIAGNOSIS — I69351 Hemiplegia and hemiparesis following cerebral infarction affecting right dominant side: Secondary | ICD-10-CM

## 2014-09-05 DIAGNOSIS — I612 Nontraumatic intracerebral hemorrhage in hemisphere, unspecified: Secondary | ICD-10-CM

## 2014-09-05 DIAGNOSIS — E1165 Type 2 diabetes mellitus with hyperglycemia: Secondary | ICD-10-CM

## 2014-09-05 DIAGNOSIS — I10 Essential (primary) hypertension: Secondary | ICD-10-CM | POA: Diagnosis not present

## 2014-09-05 DIAGNOSIS — IMO0002 Reserved for concepts with insufficient information to code with codable children: Secondary | ICD-10-CM

## 2014-09-05 DIAGNOSIS — E1140 Type 2 diabetes mellitus with diabetic neuropathy, unspecified: Secondary | ICD-10-CM | POA: Diagnosis not present

## 2014-09-05 DIAGNOSIS — I69851 Hemiplegia and hemiparesis following other cerebrovascular disease affecting right dominant side: Secondary | ICD-10-CM

## 2014-09-05 DIAGNOSIS — I6350 Cerebral infarction due to unspecified occlusion or stenosis of unspecified cerebral artery: Secondary | ICD-10-CM

## 2014-09-05 DIAGNOSIS — F329 Major depressive disorder, single episode, unspecified: Secondary | ICD-10-CM

## 2014-09-05 DIAGNOSIS — I69398 Other sequelae of cerebral infarction: Secondary | ICD-10-CM

## 2014-09-05 DIAGNOSIS — F0631 Mood disorder due to known physiological condition with depressive features: Secondary | ICD-10-CM | POA: Insufficient documentation

## 2014-09-05 HISTORY — DX: Hemiplegia and hemiparesis following cerebral infarction affecting right dominant side: I69.351

## 2014-09-05 LAB — HEMOGLOBIN A1C: Hgb A1c MFr Bld: 7.8 % — ABNORMAL HIGH (ref 4.6–6.5)

## 2014-09-05 LAB — RENAL FUNCTION PANEL
ALBUMIN: 3.8 g/dL (ref 3.5–5.2)
BUN: 13 mg/dL (ref 6–23)
CO2: 31 meq/L (ref 19–32)
Calcium: 9.7 mg/dL (ref 8.4–10.5)
Chloride: 104 mEq/L (ref 96–112)
Creatinine, Ser: 1.02 mg/dL (ref 0.40–1.50)
GFR: 76.38 mL/min (ref >60.00)
Glucose, Bld: 192 mg/dL — ABNORMAL HIGH (ref 70–99)
PHOSPHORUS: 3.2 mg/dL (ref 2.3–4.6)
Potassium: 3.8 mEq/L (ref 3.5–5.1)
SODIUM: 141 meq/L (ref 135–145)

## 2014-09-05 LAB — TSH: TSH: 0.66 u[IU]/mL (ref 0.35–4.50)

## 2014-09-05 MED ORDER — SERTRALINE HCL 25 MG PO TABS: 25.0000 mg | ORAL_TABLET | Freq: Every day | ORAL | Status: DC

## 2014-09-05 NOTE — Progress Notes (Signed)
BP 132/58 mmHg  Pulse 69  Temp(Src) 97.8 F (36.6 C) (Oral)  SpO2 98%   CC: nursing home f/u visit  Subjective:    Patient ID: Jeffrey [name], male    DOB: November 07, 1940, 74 y.o.   MRN: [id number]  HPI: Jeffrey [name] is a 74 y.o. male presenting on 09/05/2014 for Follow-up   Not seen by me since 06/2012. Discharged 08/27/2014 from [facility] after L thalamic and internal capsule hypertensive hemorrhage for which he was hospitalized 4/6-20/2016. Also seems like he had sepsis from HCAP. Residual R hemiparesis and R facial weakness. Some aphasia and dysphagia. Seeing Dr Tyrone Schimke ophthalmology for vision changes.   Living at home. Started [place] PT/OT/ST/ nurse aid coming out. Private nurse aid comes out every morning for 4 hours.   DM - prior on insulin pump. Has been seen by Dr Buddy Duty (6/22) and I have his note which was reviewed. Currently on lantus 18 u qhs + humalog flexpen 6-10 u SSI. Has close f/u with endo Dr Buddy Duty. Sugars steadily improving. Pre lunch today 181.   Cerumen in ear wax - claritin has helped some.  Mild constipation - on sennakot, miralax and colace Voiding well. No accidents.  Mood - after stroke noticing increased agitation, anxiety and feeling down. Takes 1/2 ativan at bedtime.   Relevant past medical, surgical, family and social history reviewed and updated as indicated. Interim medical history since our last visit reviewed. Allergies and medications reviewed and updated. Current Outpatient Prescriptions on File Prior to Visit  Medication Sig  . atorvastatin (LIPITOR) 40 MG tablet Take 1 tablet (40 mg total) by mouth daily. (Patient taking differently: Take 40 mg by mouth daily. For High Cholsterol)  . baclofen (LIORESAL) 10 MG tablet Take 5 mg by mouth daily. Daily in am, may have additional dose if needed for muscle spasm  . docusate sodium (COLACE) 100 MG capsule Take 100 mg by mouth 2 (two) times daily.   . insulin glargine (LANTUS) 100 UNIT/ML injection  Inject 18 Units into the skin at bedtime.   . insulin lispro (HUMALOG) 100 UNIT/ML injection Inject 5-10 Units into the skin 3 (three) times daily before meals. Cbg: 150-200 5 units 201-300 8 units 301-400 10 units  . LACTOBACILLUS PO Take 1 g by mouth 3 (three) times daily. philips colon health  . LORazepam (ATIVAN) 0.5 MG tablet 1/2 by mouth every night at bedtime schedule, HOLD FOR SEDATION  . metoprolol tartrate (LOPRESSOR) 25 MG tablet Take 25 mg by mouth 2 (two) times daily. Hold if B/P < 100/60 or HR< 60  . pantoprazole (PROTONIX) 40 MG tablet Take 40 mg by mouth daily as needed.   . polyethylene glycol (MIRALAX / GLYCOLAX) packet Take 17 g by mouth daily as needed for mild constipation or moderate constipation.  . sennosides-docusate sodium (SENOKOT-S) 8.6-50 MG tablet Take 2 tablets by mouth daily.   . tamsulosin (FLOMAX) 0.4 MG CAPS capsule Take 1 capsule (0.4 mg total) by mouth daily.   No current facility-administered medications on file prior to visit.    Review of Systems Per HPI unless specifically indicated above     Objective:    BP 132/58 mmHg  Pulse 69  Temp(Src) 97.8 F (36.6 C) (Oral)  SpO2 98%  Wt Readings from Last 3 Encounters:  07/30/14 167 lb 9.6 oz (76.023 kg)  07/11/14 167 lb 11.2 oz (76.068 kg)  06/26/14 179 lb (81.194 kg)   There is no weight on file to  calculate BMI.  Physical Exam  Constitutional: He appears well-developed and well-nourished. No distress.  HENT:  Head: Normocephalic and atraumatic.  Mouth/Throat: Oropharynx is clear and moist. No oropharyngeal exudate.  Cerumen disimpaction performed on right, pt tolerated well  Eyes: Conjunctivae and EOM are normal. Pupils are equal, round, and reactive to light. No scleral icterus.  Neck: Normal range of motion. Neck supple. No thyromegaly present.  Cardiovascular: Normal rate, regular rhythm, normal heart sounds and intact distal pulses.   No murmur heard. Pulmonary/Chest: Effort normal and  breath sounds normal. No respiratory distress. He has no wheezes. He has no rales.  Musculoskeletal: He exhibits no edema.  Lymphadenopathy:    He has no cervical adenopathy.  Neurological: He is alert.  Evident R hemiparesis. 4/5 strength RUE, RLE, 5/5 strength LUE, LLE. Some dysphasia noted.  Skin: Skin is warm and dry. No rash noted.  Nursing note and vitals reviewed.  Results for orders placed or performed in visit on 07/30/14  CBC and differential  Result Value Ref Range   Hemoglobin 12.9 (A) 13.5 - 17.5 g/dL   HCT 38 (A) 41 - 53 %   Platelets 146 (A) 150 - 399 K/L   WBC 6.6 79^4/IA  Basic metabolic panel  Result Value Ref Range   Glucose 203 mg/dL   BUN 12 4 - 21 mg/dL   Creatinine 1.0 0.6 - 1.3 mg/dL   Potassium 4.6 3.4 - 5.3 mmol/L   Sodium 141 137 - 147 mmol/L      Assessment & Plan:   Problem List Items Addressed This Visit    Depression due to stroke    Discussed with patient and family. Will start sertraline 25mg  daily.      Relevant Medications   sertraline (ZOLOFT) 25 MG tablet   Essential hypertension    Chronic, stable. Continue current regimen.      Relevant Medications   losartan (COZAAR) 50 MG tablet   Hemiparesis affecting right side as late effect of cerebrovascular accident    Has [place] therapists working with patient. Continue to monitor.      Hyperlipidemia    Continue atorvastatin. Lab Results  Component Value Date   LDLCALC 76 06/02/2014         Relevant Medications   losartan (COZAAR) 50 MG tablet   Intracerebral hemorrhage    Due to stroke      Type 2 diabetes, uncontrolled, with neuropathy - Primary    Continue f/u with Dr Buddy Duty. Check A1c. Will forward results to endo.      Relevant Medications   losartan (COZAAR) 50 MG tablet   Other Relevant Orders   Renal function panel   Hemoglobin A1c   TSH       Follow up plan: Return in about 3 months (around 12/06/2014), or as needed, for follow up visit.

## 2014-09-05 NOTE — Patient Instructions (Addendum)
labwork today. Let's try to taper off protonix (pantoprazole) medicine for heartburn. Let's do trial of sertraline (zoloft) at 25mg  once daily. Trial for a month and then if helpful we can continue.  Return as needed or in 3 months for follow up.

## 2014-09-05 NOTE — Assessment & Plan Note (Addendum)
Continue atorvastatin. Lab Results  Component Value Date   LDLCALC 76 06/02/2014

## 2014-09-05 NOTE — Assessment & Plan Note (Signed)
Continue f/u with Dr Buddy Duty. Check A1c. Will forward results to endo.

## 2014-09-05 NOTE — Assessment & Plan Note (Signed)
Chronic, stable. Continue current regimen. 

## 2014-09-05 NOTE — Assessment & Plan Note (Signed)
Discussed with patient and family. Will start sertraline 25mg  daily.

## 2014-09-05 NOTE — Assessment & Plan Note (Signed)
Has [place] therapists working with patient. Continue to monitor.

## 2014-09-05 NOTE — Assessment & Plan Note (Addendum)
Due to stroke 

## 2014-09-05 NOTE — Progress Notes (Signed)
Pre visit review using our clinic review tool, if applicable. No additional management support is needed unless otherwise documented below in the visit note. 

## 2014-09-10 ENCOUNTER — Telehealth: Payer: Self-pay

## 2014-09-10 NOTE — Telephone Encounter (Signed)
Marlowe Kays OT with [facility] left v/m requesting verbal order for home health OT 2 x a week for 6 weeks.Please advise.

## 2014-09-10 NOTE — Telephone Encounter (Signed)
Message left advising Connie 

## 2014-09-10 NOTE — Telephone Encounter (Signed)
Ok to do this. Recent stroke. thanks

## 2014-09-11 ENCOUNTER — Telehealth: Payer: Self-pay

## 2014-09-11 NOTE — Telephone Encounter (Signed)
Cath speech therapist with [facility] left v/m requesting verbal order for home health cognitive rehab 1 x a week for 1 week and 2 x a week for 4 weeks.Please advise.

## 2014-09-11 NOTE — Telephone Encounter (Signed)
Ok to do thanks. 

## 2014-09-11 NOTE — Telephone Encounter (Signed)
Cathy notified

## 2014-09-13 ENCOUNTER — Emergency Department (HOSPITAL_COMMUNITY): Payer: Medicare Other

## 2014-09-13 ENCOUNTER — Encounter (HOSPITAL_COMMUNITY): Payer: Self-pay

## 2014-09-13 ENCOUNTER — Inpatient Hospital Stay (HOSPITAL_COMMUNITY)
Admission: EM | Admit: 2014-09-13 | Discharge: 2014-09-14 | DRG: 092 | Disposition: A | Discharge status: Home / Self Care | Payer: Medicare Other | Attending: Internal Medicine | Admitting: Internal Medicine

## 2014-09-13 DIAGNOSIS — E785 Hyperlipidemia, unspecified: Secondary | ICD-10-CM | POA: Diagnosis present

## 2014-09-13 DIAGNOSIS — F0631 Mood disorder due to known physiological condition with depressive features: Secondary | ICD-10-CM | POA: Diagnosis present

## 2014-09-13 DIAGNOSIS — R410 Disorientation, unspecified: Secondary | ICD-10-CM | POA: Diagnosis present

## 2014-09-13 DIAGNOSIS — Z888 Allergy status to other drugs, medicaments and biological substances status: Secondary | ICD-10-CM | POA: Diagnosis not present

## 2014-09-13 DIAGNOSIS — N400 Enlarged prostate without lower urinary tract symptoms: Secondary | ICD-10-CM | POA: Diagnosis present

## 2014-09-13 DIAGNOSIS — E860 Dehydration: Secondary | ICD-10-CM | POA: Diagnosis present

## 2014-09-13 DIAGNOSIS — Z79899 Other long term (current) drug therapy: Secondary | ICD-10-CM

## 2014-09-13 DIAGNOSIS — R471 Dysarthria and anarthria: Secondary | ICD-10-CM | POA: Diagnosis present

## 2014-09-13 DIAGNOSIS — I10 Essential (primary) hypertension: Secondary | ICD-10-CM | POA: Diagnosis present

## 2014-09-13 DIAGNOSIS — R600 Localized edema: Secondary | ICD-10-CM | POA: Diagnosis present

## 2014-09-13 DIAGNOSIS — I252 Old myocardial infarction: Secondary | ICD-10-CM | POA: Diagnosis not present

## 2014-09-13 DIAGNOSIS — D696 Thrombocytopenia, unspecified: Secondary | ICD-10-CM | POA: Diagnosis present

## 2014-09-13 DIAGNOSIS — R4701 Aphasia: Principal | ICD-10-CM | POA: Diagnosis present

## 2014-09-13 DIAGNOSIS — Z86010 Personal history of colonic polyps: Secondary | ICD-10-CM | POA: Diagnosis not present

## 2014-09-13 DIAGNOSIS — Z87891 Personal history of nicotine dependence: Secondary | ICD-10-CM | POA: Diagnosis not present

## 2014-09-13 DIAGNOSIS — I69351 Hemiplegia and hemiparesis following cerebral infarction affecting right dominant side: Secondary | ICD-10-CM | POA: Diagnosis not present

## 2014-09-13 DIAGNOSIS — Z794 Long term (current) use of insulin: Secondary | ICD-10-CM

## 2014-09-13 DIAGNOSIS — M109 Gout, unspecified: Secondary | ICD-10-CM | POA: Diagnosis present

## 2014-09-13 DIAGNOSIS — E1165 Type 2 diabetes mellitus with hyperglycemia: Secondary | ICD-10-CM | POA: Diagnosis present

## 2014-09-13 DIAGNOSIS — K219 Gastro-esophageal reflux disease without esophagitis: Secondary | ICD-10-CM | POA: Diagnosis present

## 2014-09-13 DIAGNOSIS — J302 Other seasonal allergic rhinitis: Secondary | ICD-10-CM | POA: Diagnosis present

## 2014-09-13 DIAGNOSIS — E1149 Type 2 diabetes mellitus with other diabetic neurological complication: Secondary | ICD-10-CM | POA: Diagnosis present

## 2014-09-13 DIAGNOSIS — F329 Major depressive disorder, single episode, unspecified: Secondary | ICD-10-CM | POA: Diagnosis present

## 2014-09-13 DIAGNOSIS — I2510 Atherosclerotic heart disease of native coronary artery without angina pectoris: Secondary | ICD-10-CM | POA: Diagnosis present

## 2014-09-13 DIAGNOSIS — E876 Hypokalemia: Secondary | ICD-10-CM | POA: Diagnosis present

## 2014-09-13 DIAGNOSIS — E1140 Type 2 diabetes mellitus with diabetic neuropathy, unspecified: Secondary | ICD-10-CM | POA: Diagnosis present

## 2014-09-13 DIAGNOSIS — I5032 Chronic diastolic (congestive) heart failure: Secondary | ICD-10-CM | POA: Diagnosis present

## 2014-09-13 DIAGNOSIS — E1169 Type 2 diabetes mellitus with other specified complication: Secondary | ICD-10-CM | POA: Diagnosis present

## 2014-09-13 DIAGNOSIS — I69398 Other sequelae of cerebral infarction: Secondary | ICD-10-CM

## 2014-09-13 DIAGNOSIS — I69851 Hemiplegia and hemiparesis following other cerebrovascular disease affecting right dominant side: Secondary | ICD-10-CM | POA: Diagnosis not present

## 2014-09-13 DIAGNOSIS — R4182 Altered mental status, unspecified: Secondary | ICD-10-CM | POA: Diagnosis not present

## 2014-09-13 DIAGNOSIS — E1159 Type 2 diabetes mellitus with other circulatory complications: Secondary | ICD-10-CM | POA: Diagnosis present

## 2014-09-13 HISTORY — DX: Cerebral infarction, unspecified: I63.9

## 2014-09-13 LAB — COMPREHENSIVE METABOLIC PANEL
ALK PHOS: 99 U/L (ref 38–126)
ALT: 26 U/L (ref 17–63)
ANION GAP: 8 (ref 5–15)
AST: 20 U/L (ref 15–41)
Albumin: 3.9 g/dL (ref 3.5–5.0)
BUN: 11 mg/dL (ref 6–20)
CALCIUM: 9.6 mg/dL (ref 8.9–10.3)
CO2: 30 mmol/L (ref 22–32)
Chloride: 100 mmol/L — ABNORMAL LOW (ref 101–111)
Creatinine, Ser: 0.93 mg/dL (ref 0.61–1.24)
GFR calc Af Amer: >60 mL/min (ref >60)
Glucose, Bld: 175 mg/dL — ABNORMAL HIGH (ref 65–99)
POTASSIUM: 3.4 mmol/L — AB (ref 3.5–5.1)
Sodium: 138 mmol/L (ref 135–145)
Total Bilirubin: 0.7 mg/dL (ref 0.3–1.2)
Total Protein: 6.5 g/dL (ref 6.5–8.1)

## 2014-09-13 LAB — URINALYSIS, ROUTINE W REFLEX MICROSCOPIC
BILIRUBIN URINE: NEGATIVE
Glucose, UA: NEGATIVE mg/dL
Hgb urine dipstick: NEGATIVE
KETONES UR: NEGATIVE mg/dL
Leukocytes, UA: NEGATIVE
Nitrite: NEGATIVE
Protein, ur: NEGATIVE mg/dL
Specific Gravity, Urine: 1.01 (ref 1.005–1.030)
UROBILINOGEN UA: 0.2 mg/dL (ref 0.0–1.0)
pH: 5.5 (ref 5.0–8.0)

## 2014-09-13 LAB — CBC
HCT: 44.4 % (ref 39.0–52.0)
HEMOGLOBIN: 15.2 g/dL (ref 13.0–17.0)
MCH: 29.3 pg (ref 26.0–34.0)
MCHC: 34.2 g/dL (ref 30.0–36.0)
MCV: 85.7 fL (ref 78.0–100.0)
PLATELETS: 144 10*3/uL — AB (ref 150–400)
RBC: 5.18 MIL/uL (ref 4.22–5.81)
RDW: 13.6 % (ref 11.5–15.5)
WBC: 6.5 10*3/uL (ref 4.0–10.5)

## 2014-09-13 LAB — GLUCOSE, CAPILLARY: Glucose-Capillary: 144 mg/dL — ABNORMAL HIGH (ref 65–99)

## 2014-09-13 LAB — TROPONIN I: Troponin I: <0.03 ng/mL (ref <0.031)

## 2014-09-13 LAB — CBG MONITORING, ED: Glucose-Capillary: 167 mg/dL — ABNORMAL HIGH (ref 65–99)

## 2014-09-13 MED ORDER — ATORVASTATIN CALCIUM 40 MG PO TABS: 40.0000 mg | ORAL_TABLET | Freq: Every day | ORAL | Status: DC

## 2014-09-13 MED ORDER — METOPROLOL TARTRATE 25 MG PO TABS
25.0000 mg | ORAL_TABLET | Freq: Two times a day (BID) | ORAL | Status: DC
  Administered 2014-09-13 – 2014-09-14 (×2): 25 mg via ORAL
  Filled 2014-09-13 (×2): qty 1

## 2014-09-13 MED ORDER — SENNA-DOCUSATE SODIUM 8.6-50 MG PO TABS: 2.0000 | ORAL_TABLET | Freq: Every day | ORAL | Status: DC

## 2014-09-13 MED ORDER — STROKE: EARLY STAGES OF RECOVERY BOOK
Freq: Once | Status: AC
  Administered 2014-09-13: 1

## 2014-09-13 MED ORDER — SENNOSIDES-DOCUSATE SODIUM 8.6-50 MG PO TABS
2.0000 | ORAL_TABLET | Freq: Every day | ORAL | Status: DC
  Administered 2014-09-14: 2 via ORAL
  Filled 2014-09-13: qty 2

## 2014-09-13 MED ORDER — POLYETHYLENE GLYCOL 3350 17 G PO PACK: 17.0000 g | PACK | Freq: Every day | ORAL | Status: DC | PRN

## 2014-09-13 MED ORDER — DOCUSATE SODIUM 100 MG PO CAPS
100.0000 mg | ORAL_CAPSULE | Freq: Two times a day (BID) | ORAL | Status: DC
  Administered 2014-09-13 – 2014-09-14 (×2): 100 mg via ORAL
  Filled 2014-09-13 (×2): qty 1

## 2014-09-13 MED ORDER — SERTRALINE HCL 50 MG PO TABS
25.0000 mg | ORAL_TABLET | Freq: Every day | ORAL | Status: DC
  Administered 2014-09-14: 25 mg via ORAL
  Filled 2014-09-13: qty 1

## 2014-09-13 MED ORDER — PANTOPRAZOLE SODIUM 40 MG PO TBEC
40.0000 mg | DELAYED_RELEASE_TABLET | Freq: Every day | ORAL | Status: DC
  Administered 2014-09-14: 40 mg via ORAL
  Filled 2014-09-13: qty 1

## 2014-09-13 MED ORDER — BACLOFEN 10 MG PO TABS
5.0000 mg | ORAL_TABLET | Freq: Every day | ORAL | Status: DC
  Administered 2014-09-14: 5 mg via ORAL
  Filled 2014-09-13: qty 1

## 2014-09-13 MED ORDER — ENOXAPARIN SODIUM 40 MG/0.4ML ~~LOC~~ SOLN
40.0000 mg | SUBCUTANEOUS | Status: DC
  Administered 2014-09-13: 40 mg via SUBCUTANEOUS
  Filled 2014-09-13: qty 0.4

## 2014-09-13 MED ORDER — SODIUM CHLORIDE 0.9 % IV SOLN
INTRAVENOUS | Status: DC
  Administered 2014-09-13: 19:00:00 via INTRAVENOUS
  Administered 2014-09-14: 75 mL/h via INTRAVENOUS

## 2014-09-13 MED ORDER — LORAZEPAM 0.5 MG PO TABS
0.2500 mg | ORAL_TABLET | Freq: Every day | ORAL | Status: DC
  Administered 2014-09-13: 0.25 mg via ORAL
  Filled 2014-09-13: qty 1

## 2014-09-13 MED ORDER — LOSARTAN POTASSIUM 50 MG PO TABS
50.0000 mg | ORAL_TABLET | Freq: Every day | ORAL | Status: DC
  Administered 2014-09-14: 50 mg via ORAL
  Filled 2014-09-13: qty 1

## 2014-09-13 MED ORDER — TAMSULOSIN HCL 0.4 MG PO CAPS
0.4000 mg | ORAL_CAPSULE | Freq: Every day | ORAL | Status: DC
  Administered 2014-09-13 – 2014-09-14 (×2): 0.4 mg via ORAL
  Filled 2014-09-13 (×2): qty 1

## 2014-09-13 MED ORDER — INSULIN GLARGINE 100 UNIT/ML ~~LOC~~ SOLN
18.0000 [IU] | Freq: Every day | SUBCUTANEOUS | Status: DC
  Administered 2014-09-13: 18 [IU] via SUBCUTANEOUS
  Filled 2014-09-13 (×2): qty 0.18

## 2014-09-13 NOTE — ED Notes (Signed)
Called lab to add on troponin  

## 2014-09-13 NOTE — ED Notes (Signed)
Pt had a stroke back in March, released from rehab 2 weeks ago, went to [place] and had meds adjusted on Tues. Started on zoloft 1 1/2 weeks ago. Had a bad dream midday yesterday and since then has woke up disoriented and scared. Hasn't been able to articulate clearly as he had been for the past month. Son and daughter are here with him.

## 2014-09-13 NOTE — H&P (Signed)
[facility] History and Physical                                                                                    Jeffrey [name], is a 74 y.o. male  MRN: [id number]   DOB - 1940/06/16  Admit Date - 09/13/2014  Outpatient Primary MD for the patient is [name], MD  Referring MD: Eliott Nine  Consulting M.D: Nicole Kindred / Neurology  With History of -  Past Medical History  Diagnosis Date  . CAD (coronary artery disease)     PCI distal RCA ...2004, residual 70% LAD   /   ...nuclear...03/2007...no ischemia.Marland KitchenMarland Kitchenpreserved LV /  nuclear...03/03/2009...inferior scar..no ischemia..EF 51%  . Dyslipidemia     takes Atorvastatin daily  . Internal hemorrhoids   . Gout     takes Allopurinol daily and Colchicine as needed  . Seasonal allergic rhinitis   . Right carotid bruit   . Cyst of nasopharynx     per ENT Wilburn Cornelia  . Myocardial infarction 33yrs ago  . Peripheral edema     takes Lasix daily  . HTN (hypertension)     takes Cardura,Metoprolol,[name],and Amlodipine daily  . Arthritis   . GERD (gastroesophageal reflux disease)     takes Omeprazole daily  . History of colon polyps   . Type 2 diabetes, uncontrolled, with neuropathy 1989    takes Invokana daily and has an insulin pump (Dr. [name])  . 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 HCAP (healthcare-associated pneumonia)   . Stroke     right sided deficits      Past Surgical History  Procedure Laterality Date  . Cataract extraction Bilateral 2010  . Elbow surgery Right 1997  . Balloon angioplasty, artery  1992, 2004    CAD, Dr. Ron Parker  . Colonoscopy  04/03/2002    adenomatous polyp, int hemorrhoids  . Colonoscopy  07/18/2007    normal (Dr. Fuller Plan)  . Rotator cuff repair  09/2011    left, with subacromial decompression  . Nasal septum surgery    . Colonoscopy  09/26/2012    tubular adenoma, sm int hem, rpt 5 yrs Fuller Plan)  . Tonsillectomy and adenoidectomy    . Eye  lids raised    . Cardiac catheterization  2004  . Polypectomy N/A 05/27/2013    Procedure: ENDOSCOPIC NASOPHARYNGEAL MASS;  Surgeon: [name], MD    in for   Chief Complaint  Patient presents with  . Altered Mental Status     HPI This is a 74 year old male patient who had left thalamic and internal capsule hypertensive hemorrhagic stroke in April 2016. He was subsequently discharged initially to CIR but due to slow progress was later discharged to [facility] for additional rehabilitative therapy. He was subsequently discharged from that facility on 6/22. Patient has residual right hemiparesis and right facial weakness. He has a history of diabetes with neuropathy, intermittent recurring thrombocytopenia, chronic diastolic heart failure, dyslipidemia, CAD as well as depression post stroke. He was evaluated at his primary care physician's office on 7/1 and was started on low-dose Zoloft 25 mg daily for stroke. Other medical  problems remain stable. His family brought him to the ER today after noticing him developing episodic aphasia that began on 7/8. The wife reported to the ED. The patient woke up from the bedroom and called her by the wrong name on 7/8 and patient has had any improvement in his speech pattern since that time. According to the family the patient's initial stroke related aphasia and dysarthria had resolved and would only recur if the patient was tired patient has apparently not had any other symptoms such as acute infectious illnesses, nausea vomiting diarrhea, fevers, new pain.  In the ER patient was afebrile, heart rate was 70 bpm, blood pressure is 127/69, room air saturations were 96%. Two view chest x-ray showed no acute cardiopulmonary disease. CT of the head without contrast revealed no evidence of acute intracranial abnormality and demonstrated encephalomalacia in the left thalamic and internal capsule area consistent with prior hemorrhage. Laboratory data unremarkable  except for mild hypokalemia potassium 3.4 and mild hyperglycemia with glucose 175. Upon was less than 0.03. CBC was normal except for platelets 144,000. In review of old labs patient has a history of intermittent thrombocytopenia with platelets as low as 101,000 with most recent prior to this admission 146,000 in May. EDP spoke with Dr. Nicole Kindred in neurology who recommended an MRI. In further discussion with the patient and his wife he has not had any infectious symptoms fevers chills nausea vomiting diarrhea. Patient reports that he could clearly understand words spoken to him that he was having trouble formulating the correct words to speak earlier; at present he feels symptoms are improving and his wife agrees. Patient endorses he has not been eating as well as he should and has not had much of an appetite.   Review of Systems   In addition to the HPI above,  No Fever-chills, myalgias or other constitutional symptoms No Headache, changes with Vision or hearing, new weakness, tingling, numbness in any extremity No problems swallowing food or Liquids, indigestion/reflux No Chest pain, Cough or Shortness of Breath, palpitations, orthopnea or DOE No Abdominal pain, N/V; no melena or hematochezia, no dark tarry stools, Bowel movements are regular, No dysuria, hematuria or flank pain No new skin rashes, lesions, masses or bruises, No new joints pains-aches No recent weight gain or loss No polyuria, polydypsia or polyphagia,  *A full 10 point Review of Systems was done, except as stated above, all other Review of Systems were negative.  Social History History  Substance Use Topics  . Smoking status: Former Research scientist (life sciences)  . Smokeless tobacco: Never Used     Comment: quit smoking 45 years ago.  . Alcohol Use: No    Resides at: Private residence  Lives with: Wife  Ambulatory status: Wheelchair although has been working with therapy to utilize walker   Family History Family History  Problem  Relation Age of Onset  . Alcohol abuse Father   . Coronary artery disease Neg Hx   . Stroke Neg Hx   . Cancer Neg Hx   . Diabetes Neg Hx   . Colon cancer Neg Hx      Prior to Admission medications   Medication Sig Start Date End Date Taking? Authorizing Provider  atorvastatin (LIPITOR) 40 MG tablet Take 1 tablet (40 mg total) by mouth daily. Patient taking differently: Take 40 mg by mouth daily. For High Cholsterol 06/11/14 06/11/15  Domenic Polite, MD  baclofen (LIORESAL) 10 MG tablet Take 5 mg by mouth daily. Daily in am, may have additional dose  if needed for muscle spasm    Historical Provider, MD  docusate sodium (COLACE) 100 MG capsule Take 100 mg by mouth 2 (two) times daily.     Historical Provider, MD  insulin glargine (LANTUS) 100 UNIT/ML injection Inject 18 Units into the skin at bedtime.     Historical Provider, MD  insulin lispro (HUMALOG) 100 UNIT/ML injection Inject 5-10 Units into the skin 3 (three) times daily before meals. Cbg: 150-200 5 units 201-300 8 units 301-400 10 units    Historical Provider, MD  LACTOBACILLUS PO Take 1 g by mouth 3 (three) times daily. [facility]    Historical Provider, MD  loratadine (CLARITIN) 10 MG tablet Take 10 mg by mouth daily.    Historical Provider, MD  LORazepam (ATIVAN) 0.5 MG tablet 1/2 by mouth every night at bedtime schedule, HOLD FOR SEDATION 08/14/14   Lauree Chandler, NP  losartan (COZAAR) 50 MG tablet Take 50 mg by mouth daily.    Historical Provider, MD  metoprolol tartrate (LOPRESSOR) 25 MG tablet Take 25 mg by mouth 2 (two) times daily. Hold if B/P < 100/60 or HR< 60    Historical Provider, MD  pantoprazole (PROTONIX) 40 MG tablet Take 40 mg by mouth daily as needed.     Historical Provider, MD  polyethylene glycol (MIRALAX / GLYCOLAX) packet Take 17 g by mouth daily as needed for mild constipation or moderate constipation.    Historical Provider, MD  sennosides-docusate sodium (SENOKOT-S) 8.6-50 MG tablet Take 2 tablets  by mouth daily.     Historical Provider, MD  sertraline (ZOLOFT) 25 MG tablet Take 1 tablet (25 mg total) by mouth daily. 09/05/14   [name], MD  tamsulosin (FLOMAX) 0.4 MG CAPS capsule Take 1 capsule (0.4 mg total) by mouth daily. 06/11/14   Domenic Polite, MD    Allergies  Allergen Reactions  . Niacin Other (See Comments)    REACTION: intolerant,not allergic="flushing,hot flashes,turning red"    Physical Exam  Vitals  Blood pressure 148/87, pulse 69, temperature 97.7 F (36.5 C), temperature source Oral, resp. rate 15, height 5\' 8"  (1.727 m), weight 170 lb (77.111 kg), SpO2 99 %.   General:  In no acute distress, appears healthy and well nourished  Psych:  Normal affect, Denies Suicidal or Homicidal ideations, Awake Alert, Oriented X 3. Speech and thought patterns are clear and appropriate, no apparent short term memory deficits  Neuro:   Stable chronic focal neurological deficits of right hemiparesis, CN II through XII intact, Strength 5/5 left side, Sensation intact all 4 extremities.  ENT:  Ears and Eyes appear Normal, Conjunctivae clear, PER. Moist oral mucosa without erythema or exudates.  Neck:  Supple, No lymphadenopathy appreciated  Respiratory:  Symmetrical chest wall movement, Good air movement bilaterally, CTAB. Room Air  Cardiac:  Occasionally irregular with unifocal PVC noted on monitor, No Murmurs, no LE edema noted, no JVD, No carotid bruits, peripheral pulses palpable at 2+  Abdomen:  Positive bowel sounds, Soft, Non tender, Non distended,  No masses appreciated, no obvious hepatosplenomegaly  Skin:  No Cyanosis, Normal Skin Turgor, No Skin Rash or Bruise.  Extremities: Symmetrical without obvious trauma or injury,  no effusions.  Data Review  CBC  Recent Labs Lab 09/13/14 1308  WBC 6.5  HGB 15.2  HCT 44.4  PLT 144*  MCV 85.7  MCH 29.3  MCHC 34.2  RDW 13.6    Chemistries   Recent Labs Lab 09/13/14 1308  NA 138  K 3.4*  CL 100*    CO2 30  GLUCOSE 175*  BUN 11  CREATININE 0.93  CALCIUM 9.6  AST 20  ALT 26  ALKPHOS 99  BILITOT 0.7    estimated creatinine clearance is 70.5 mL/min (by C-G formula based on Cr of 0.93).  No results for input(s): TSH, T4TOTAL, T3FREE, THYROIDAB in the last 72 hours.  Invalid input(s): FREET3  Coagulation profile No results for input(s): INR, PROTIME in the last 168 hours.  No results for input(s): DDIMER in the last 72 hours.  Cardiac Enzymes  Recent Labs Lab 09/13/14 1308  TROPONINI <0.03    Invalid input(s): POCBNP  Urinalysis    Component Value Date/Time   COLORURINE YELLOW 09/13/2014 [place] 09/13/2014 1520   LABSPEC 1.010 09/13/2014 1520   PHURINE 5.5 09/13/2014 1520   GLUCOSEU NEGATIVE 09/13/2014 1520   HGBUR NEGATIVE 09/13/2014 1520   BILIRUBINUR NEGATIVE 09/13/2014 1520   KETONESUR NEGATIVE 09/13/2014 1520   PROTEINUR NEGATIVE 09/13/2014 1520   UROBILINOGEN 0.2 09/13/2014 1520   NITRITE NEGATIVE 09/13/2014 1520   LEUKOCYTESUR NEGATIVE 09/13/2014 1520    Imaging results:   Dg Chest 2 View  09/13/2014   CLINICAL DATA:  stroke back in March, released from rehab 2 weeks ago, went to [place] and had meds adjusted on Tues. Started on zoloft 1 1/2 weeks ago. Had a bad dream midday yesterday and since then has woke up disoriented and scared. Hasn't been able to articulate clearly as he had been for the past month. Pt is confused and unable to find words- weakness on right side  EXAM: CHEST - 2 VIEW  COMPARISON:  06/23/2014  FINDINGS: Lungs are clear. Resolution of lower lobe opacity seen previously. Atheromatous aorta. Heart size and mediastinal contours are within normal limits. No effusion.  No pneumothorax. Visualized skeletal structures are unremarkable.  IMPRESSION: No acute cardiopulmonary disease.   Electronically Signed   By: Lucrezia Europe M.D.   On: 09/13/2014 14:48   Ct Head Wo Contrast  09/13/2014   CLINICAL DATA:  74 year old male with  altered mental status, generalized weakness and dizziness.  EXAM: CT HEAD WITHOUT CONTRAST  TECHNIQUE: Contiguous axial images were obtained from the base of the skull through the vertex without intravenous contrast.  COMPARISON:  05/30/2014 and 05/14/2013 CTs  FINDINGS: Encephalomalacia in the left thalamus/internal capsule noted in area of prior hemorrhage. Right cerebellar calcification is unchanged. Mild chronic small-vessel white matter ischemic changes are again noted.  No acute intracranial abnormalities are identified, including mass lesion or mass effect, hydrocephalus, extra-axial fluid collection, midline shift, hemorrhage, or acute infarction.  The visualized bony calvarium is unremarkable.  IMPRESSION: No evidence of acute intracranial abnormality.   Electronically Signed   By: Margarette Canada M.D.   On: 09/13/2014 14:38     EKG: (Independently reviewed) sinus rhythm with one unifocal PVC, normal QTC, no ischemic changes based on current ST segments and T waves.   Assessment & Plan  Principal Problem:   New Aphasia/dysarthria in patient with prior hemorrhagic stroke with residual right hemiparesis -Admit to telemetry -Appreciate neurology assistance -MRI without acute stroke or other explanation for patient's current symptoms -Partial stroke workup: no indication at this juncture to repeat echocardiogram or carotid duplex -Consider atypical presentation/absence seizures so check EEG -PT/OT/SLP evaluations -Hemoglobin A1c was 7.8 July 1 -Last lipid panel was March 2016-repeat this admission -RN bedside swallow eval-advance diet if passes -Was not on anti-platelet previously because of hemorrhagic stroke therefore will not start  anti-platelet medication -Patient was on baclofen for muscle spasms prior to admission; can resume once passes a swallowing evaluation  Active Problems:   Dehydration/Hypokalemia -Gentle IV fluid hydration -Replete potassium-Will wait on swallowing evaluation  since prefer oral route    Essential hypertension -Current blood pressure well controlled -Continue Cozaar and Lopressor once swallow evaluation completed    Type 2 diabetes, uncontrolled, with neuropathy -Hemoglobin A1c 7.8 July 1 -Continue preadmission Lantus -Check CBG AC/HS and provide sliding scale coverage -No evidence of hypoglycemia at time of symptoms or since arrival    Thrombocytopenia -Chronic and recurrent noting platelets are greater than 100,000    Chronic diastolic heart failure, NYHA class 1 -Appears compensated out symptoms of heart failure    Hyperlipidemia -Was on statin prior to admission and can resume once patient passes a swallowing evaluation    CAD  -Currently without symptoms and EKG unremarkable as was troponin -Beta Blocker as above    Depression due to stroke -Recent start low dose Zoloft on 7/1 -MRI did not reveal new stroke-consider discontinuing Zoloft in the event this is causing patient's aphasia and dysarthria    BPH -Continue Flomax after pass a swallowing evaluation    DVT Prophylaxis: Lovenox  Family Communication:  Wife at bedside   Code Status:  Full code  Condition:  Stable  Discharge disposition: Anticipate discharge back to home once aphasia workup completed; resume home health PT/OT  Time spent in minutes : 60      Chaniece Barbato L. ANP on 09/13/2014 at 4:05 PM  Between 7am to 7pm - Pager - [phone]  After 7pm go to [email or web address] - password [email or web address]  And look for the night coverage person covering me after hours  [facility]

## 2014-09-13 NOTE — ED Provider Notes (Signed)
CSN: [id number]     Arrival date & time 09/13/14  1240 History   First MD Initiated Contact with Patient 09/13/14 1335     Chief Complaint  Patient presents with  . Altered Mental Status     (Consider location/radiation/quality/duration/timing/severity/associated sxs/prior Treatment) HPI Comments: Family comes in stating that pt started having episodes of aphasia that started yesterday. Wife state that he woke up from a bad dream and called her by the wrong name and he has been having episode like this since. He has a stroke in March but his speech has gotten almost back to baseline except when he is tired. He has chronic right sided weakness. No fever, pain, vomiting. Family states that he will talk and it isn't necessarily slurred but he doesn't necessarily put the right words together. Pt is aware that this is happening when asked. Family also state that he was placed on zoloft about 10 days ago  The history is provided by a relative. No language interpreter was used.    Past Medical History  Diagnosis Date  . CAD (coronary artery disease)     PCI distal RCA ...2004, residual 70% LAD   /   ...nuclear...03/2007...no ischemia.Marland KitchenMarland Kitchenpreserved LV /  nuclear...03/03/2009...inferior scar..no ischemia..EF 51%  . Dyslipidemia     takes Atorvastatin daily  . Internal hemorrhoids   . Gout     takes Allopurinol daily and Colchicine as needed  . Seasonal allergic rhinitis   . Right carotid bruit   . Cyst of nasopharynx     per ENT Wilburn Cornelia  . Myocardial infarction 67yrs ago  . Peripheral edema     takes Lasix daily  . HTN (hypertension)     takes Cardura,Metoprolol,[name],and Amlodipine daily  . Arthritis   . GERD (gastroesophageal reflux disease)     takes Omeprazole daily  . History of colon polyps   . Type 2 diabetes, uncontrolled, with neuropathy 1989    takes Invokana daily and has an insulin pump (Dr. [name])  . 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 HCAP (healthcare-associated pneumonia)   . Stroke     right sided deficits   Past Surgical History  Procedure Laterality Date  . Cataract extraction Bilateral 2010  . Elbow surgery Right 1997  . Balloon angioplasty, artery  1992, 2004    CAD, Dr. Ron Parker  . Colonoscopy  04/03/2002    adenomatous polyp, int hemorrhoids  . Colonoscopy  07/18/2007    normal (Dr. Fuller Plan)  . Rotator cuff repair  09/2011    left, with subacromial decompression  . Nasal septum surgery    . Colonoscopy  09/26/2012    tubular adenoma, sm int hem, rpt 5 yrs Fuller Plan)  . Tonsillectomy and adenoidectomy    . Eye lids raised    . Cardiac catheterization  2004  . Polypectomy N/A 05/27/2013    Procedure: ENDOSCOPIC NASOPHARYNGEAL MASS;  Surgeon: [name], MD   Family History  Problem Relation Age of Onset  . Alcohol abuse Father   . Coronary artery disease Neg Hx   . Stroke Neg Hx   . Cancer Neg Hx   . Diabetes Neg Hx   . Colon cancer Neg Hx    History  Substance Use Topics  . Smoking status: Former Research scientist (life sciences)  . Smokeless tobacco: Never Used     Comment: quit smoking 45 years ago.  . Alcohol Use: No    Review of Systems  Unable to  perform ROS: Acuity of condition  All other systems reviewed and are negative.     Allergies  Niacin  Home Medications   Prior to Admission medications   Medication Sig Start Date End Date Taking? Authorizing Provider  atorvastatin (LIPITOR) 40 MG tablet Take 1 tablet (40 mg total) by mouth daily. Patient taking differently: Take 40 mg by mouth daily. For High Cholsterol 06/11/14 06/11/15  Domenic Polite, MD  baclofen (LIORESAL) 10 MG tablet Take 5 mg by mouth daily. Daily in am, may have additional dose if needed for muscle spasm    Historical Provider, MD  docusate sodium (COLACE) 100 MG capsule Take 100 mg by mouth 2 (two) times daily.     Historical Provider, MD  insulin glargine (LANTUS) 100 UNIT/ML injection Inject 18 Units into the skin at bedtime.      Historical Provider, MD  insulin lispro (HUMALOG) 100 UNIT/ML injection Inject 5-10 Units into the skin 3 (three) times daily before meals. Cbg: 150-200 5 units 201-300 8 units 301-400 10 units    Historical Provider, MD  LACTOBACILLUS PO Take 1 g by mouth 3 (three) times daily. [facility]    Historical Provider, MD  loratadine (CLARITIN) 10 MG tablet Take 10 mg by mouth daily.    Historical Provider, MD  LORazepam (ATIVAN) 0.5 MG tablet 1/2 by mouth every night at bedtime schedule, HOLD FOR SEDATION 08/14/14   Lauree Chandler, NP  losartan (COZAAR) 50 MG tablet Take 50 mg by mouth daily.    Historical Provider, MD  metoprolol tartrate (LOPRESSOR) 25 MG tablet Take 25 mg by mouth 2 (two) times daily. Hold if B/P < 100/60 or HR< 60    Historical Provider, MD  pantoprazole (PROTONIX) 40 MG tablet Take 40 mg by mouth daily as needed.     Historical Provider, MD  polyethylene glycol (MIRALAX / GLYCOLAX) packet Take 17 g by mouth daily as needed for mild constipation or moderate constipation.    Historical Provider, MD  sennosides-docusate sodium (SENOKOT-S) 8.6-50 MG tablet Take 2 tablets by mouth daily.     Historical Provider, MD  sertraline (ZOLOFT) 25 MG tablet Take 1 tablet (25 mg total) by mouth daily. 09/05/14   [name], MD  tamsulosin (FLOMAX) 0.4 MG CAPS capsule Take 1 capsule (0.4 mg total) by mouth daily. 06/11/14   Domenic Polite, MD   BP 127/69 mmHg  Pulse 70  Temp(Src) 97.7 F (36.5 C) (Oral)  Resp 18  Ht 5\' 8"  (1.727 m)  Wt 170 lb (77.111 kg)  BMI 25.85 kg/m2  SpO2 96% Physical Exam  Constitutional: He appears well-developed and well-nourished.  HENT:  Head: Normocephalic and atraumatic.  Neck: Normal range of motion. Neck supple.  Cardiovascular: Normal rate and regular rhythm.   Pulmonary/Chest: Effort normal and breath sounds normal.  Abdominal: Soft. Bowel sounds are normal.  Musculoskeletal: Normal range of motion.  Neurological: He is alert.  Right  sided weakness. Pt is aphasic. He was oriented to self and time but wasn't sure what the hospital was called but he knew he has been here before.  Skin: Skin is warm and dry.  Psychiatric: He has a normal mood and affect.  Nursing note and vitals reviewed.   ED Course  Procedures (including critical care time) Labs Review Labs Reviewed  CBC - Abnormal; Notable for the following:    Platelets 144 (*)    All other components within normal limits  COMPREHENSIVE METABOLIC PANEL - Abnormal; Notable for the  following:    Potassium 3.4 (*)    Chloride 100 (*)    Glucose, Bld 175 (*)    All other components within normal limits  CBG MONITORING, ED - Abnormal; Notable for the following:    Glucose-Capillary 167 (*)    All other components within normal limits  TROPONIN I  URINALYSIS, ROUTINE W REFLEX MICROSCOPIC (NOT AT [facility])    Imaging Review Dg Chest 2 View  09/13/2014   CLINICAL DATA:  stroke back in March, released from rehab 2 weeks ago, went to [place] and had meds adjusted on Tues. Started on zoloft 1 1/2 weeks ago. Had a bad dream midday yesterday and since then has woke up disoriented and scared. Hasn't been able to articulate clearly as he had been for the past month. Pt is confused and unable to find words- weakness on right side  EXAM: CHEST - 2 VIEW  COMPARISON:  06/23/2014  FINDINGS: Lungs are clear. Resolution of lower lobe opacity seen previously. Atheromatous aorta. Heart size and mediastinal contours are within normal limits. No effusion.  No pneumothorax. Visualized skeletal structures are unremarkable.  IMPRESSION: No acute cardiopulmonary disease.   Electronically Signed   By: Lucrezia Europe M.D.   On: 09/13/2014 14:48   Ct Head Wo Contrast  09/13/2014   CLINICAL DATA:  74 year old male with altered mental status, generalized weakness and dizziness.  EXAM: CT HEAD WITHOUT CONTRAST  TECHNIQUE: Contiguous axial images were obtained from the base of the skull through the vertex without  intravenous contrast.  COMPARISON:  05/30/2014 and 05/14/2013 CTs  FINDINGS: Encephalomalacia in the left thalamus/internal capsule noted in area of prior hemorrhage. Right cerebellar calcification is unchanged. Mild chronic small-vessel white matter ischemic changes are again noted.  No acute intracranial abnormalities are identified, including mass lesion or mass effect, hydrocephalus, extra-axial fluid collection, midline shift, hemorrhage, or acute infarction.  The visualized bony calvarium is unremarkable.  IMPRESSION: No evidence of acute intracranial abnormality.   Electronically Signed   By: Margarette Canada M.D.   On: 09/13/2014 14:38     EKG Interpretation   Date/Time:  Saturday September 13 2014 13:01:45 EDT Ventricular Rate:  69 PR Interval:  156 QRS Duration: 108 QT Interval:  434 QTC Calculation: 465 R Axis:   49 Text Interpretation:  Sinus rhythm with occasional Premature ventricular  complexes and Premature atrial complexes Otherwise normal ECG Confirmed by  [facility]  MD, TREY ([id number]) on 09/13/2014 2:48:19 PM      MDM   Final diagnoses:  Altered mental state  Aphasic disturbance    Spoke with Dr. Nicole Kindred with neurology who recommended mri and admission. Will admit to the hospitalist. No acute finding on ct.    Glendell Docker, NP 09/13/14 [place], MD 09/14/14 [phone]

## 2014-09-13 NOTE — ED Notes (Signed)
Paged [facility] to RN-Katie

## 2014-09-13 NOTE — ED Notes (Signed)
Spoke to Admitting who sts MD will come to follow up with pt to determine patient placement.  Requesting we please hold patient in ED until MD able to see patient to determine if admission is required.

## 2014-09-13 NOTE — ED Provider Notes (Signed)
Medical screening examination/treatment/procedure(s) were conducted as a shared visit with non-physician practitioner(s) and myself.  I personally evaluated the patient during the encounter.   EKG Interpretation   Date/Time:  Saturday September 13 2014 13:01:45 EDT Ventricular Rate:  69 PR Interval:  156 QRS Duration: 108 QT Interval:  434 QTC Calculation: 465 R Axis:   49 Text Interpretation:  Sinus rhythm with occasional Premature ventricular  complexes and Premature atrial complexes Otherwise normal ECG Confirmed by  [facility]  MD, TREY ([id number]) on 09/13/2014 2:48:19 PM      74 yo male with recent stroke presenting with difficulty speaking since yesterday.  On exam, well appearing, nontoxic, not distressed, normal respiratory effort, normal perfusion, clearly has word finding difficulty, but no slurred speech.  CT head negative.  Plan admit for further neurologic workup.   Clinical Impression: 1. Aphasic disturbance   2. Altered mental state        Serita Grit, MD 09/13/14 [phone]

## 2014-09-13 NOTE — ED Notes (Signed)
Meal Tray has been ordered; pt. Order needs to go to 4N room 6

## 2014-09-13 NOTE — ED Notes (Signed)
Per Dr. Maryland Pink, pt to be admitted.

## 2014-09-13 NOTE — Progress Notes (Signed)
Patient admitted to room 4N06 at this time. Alert and in stable condition.

## 2014-09-13 NOTE — ED Notes (Signed)
Neurology at bedside.

## 2014-09-13 NOTE — ED Notes (Signed)
Hospitalist Ellis at bedside.

## 2014-09-13 NOTE — Consult Note (Signed)
Admission H&P    Chief Complaint: Slurred speech with confusion and word finding difficulty.  HPI: Jeffrey [name] is an 74 y.o. male with a history of left cerebral infarction with residual right hemiparesis in March 2016, her artery disease, hypertension, hyperlipidemia and diabetes mellitus resulting with recurrence of slurred speech speech output difficulty as well as confusion which started about 2 PM on 09/12/2014. Intervals were thought to be related to patient being somewhat fatigued. He still had no symptoms woke up this morning. He's been taking aspirin 81 mg per day. CT scan of his head showed no acute intracranial abnormality. MRI of his brain without contrast showed no signs of an acute recurrent stroke. Carotid Doppler and 2-D echocardiogram was unremarkable in March 2016. She had a prolonged stay at rehabilitation at the [facility] and his discharge about 2 weeks ago. NIH stroke score was 3 with weakness and drift and right extremities. Speech returned to baseline after MRI study was obtained.  LSN: 2 PM on 09/12/2014 tPA Given: No: Deficits resolved mRankin:  Past Medical History  Diagnosis Date  . CAD (coronary artery disease)     PCI distal RCA ...2004, residual 70% LAD   /   ...nuclear...03/2007...no ischemia.Marland KitchenMarland Kitchenpreserved LV /  nuclear...03/03/2009...inferior scar..no ischemia..EF 51%  . Dyslipidemia     takes Atorvastatin daily  . Internal hemorrhoids   . Gout     takes Allopurinol daily and Colchicine as needed  . Seasonal allergic rhinitis   . Right carotid bruit   . Cyst of nasopharynx     per ENT Wilburn Cornelia  . Myocardial infarction 31yr ago  . Peripheral edema     takes Lasix daily  . HTN (hypertension)     takes Cardura,Metoprolol,[name],and Amlodipine daily  . Arthritis   . GERD (gastroesophageal reflux disease)     takes Omeprazole daily  . History of colon polyps   . Type 2 diabetes, uncontrolled, with neuropathy 1989    takes Invokana daily and has an  insulin pump ([name]  . 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 HCAP (healthcare-associated pneumonia)   . Stroke     right sided deficits    Past Surgical History  Procedure Laterality Date  . Cataract extraction Bilateral 2010  . Elbow surgery Right 1997  . Balloon angioplasty, artery  1992, 2004    CAD, Dr. KRon Parker . Colonoscopy  04/03/2002    adenomatous polyp, int hemorrhoids  . Colonoscopy  07/18/2007    normal (Dr. SFuller Plan  . Rotator cuff repair  09/2011    left, with subacromial decompression  . Nasal septum surgery    . Colonoscopy  09/26/2012    tubular adenoma, sm int hem, rpt 5 yrs (Fuller Plan  . Tonsillectomy and adenoidectomy    . Eye lids raised    . Cardiac catheterization  2004  . Polypectomy N/A 05/27/2013    Procedure: ENDOSCOPIC NASOPHARYNGEAL MASS;  Surgeon: DJerrell Belfast MD    Family History  Problem Relation Age of Onset  . Alcohol abuse Father   . Coronary artery disease Neg Hx   . Stroke Neg Hx   . Cancer Neg Hx   . Diabetes Neg Hx   . Colon cancer Neg Hx    Social History:  reports that he has quit smoking. He has never used smokeless tobacco. He reports that he does not drink alcohol or use illicit drugs.  Allergies:  Allergies  Allergen Reactions  .  Niacin Other (See Comments)    REACTION: intolerant,not allergic="flushing,hot flashes,turning red"   Medications: Patient's preadmission medications were reviewed by me.  ROS: History obtained from spouse and the patient  General ROS: negative for - chills, fatigue, fever, night sweats, weight gain or weight loss Psychological ROS: negative for - behavioral disorder, hallucinations, memory difficulties, mood swings or suicidal ideation Ophthalmic ROS: negative for - blurry vision, double vision, eye pain or loss of vision ENT ROS: negative for - epistaxis, nasal discharge, oral lesions, sore throat, tinnitus or vertigo Allergy and Immunology  ROS: negative for - hives or itchy/watery eyes Hematological and Lymphatic ROS: negative for - bleeding problems, bruising or swollen lymph nodes Endocrine ROS: negative for - galactorrhea, hair pattern changes, polydipsia/polyuria or temperature intolerance Respiratory ROS: negative for - cough, hemoptysis, shortness of breath or wheezing Cardiovascular ROS: negative for - chest pain, dyspnea on exertion, edema or irregular heartbeat Gastrointestinal ROS: negative for - abdominal pain, diarrhea, hematemesis, nausea/vomiting or stool incontinence Genito-Urinary ROS: negative for - dysuria, hematuria, incontinence or urinary frequency/urgency Musculoskeletal ROS: negative for - joint swelling or muscular weakness Neurological ROS: as noted in HPI Dermatological ROS: negative for rash and skin lesion changes  Physical Examination: Blood pressure 145/72, pulse 72, temperature 97.7 F (36.5 C), temperature source Oral, resp. rate 16, height 5' 8"  (1.727 m), weight 77.111 kg (170 lb), SpO2 99 %.  HEENT-  Normocephalic, no lesions, without obvious abnormality.  Normal external eye and conjunctiva.  Normal TM's bilaterally.  Normal auditory canals and external ears. Normal external nose, mucus membranes and septum.  Normal pharynx. Neck supple with no masses, nodes, nodules or enlargement. Cardiovascular - regular rate and rhythm, S1, S2 normal, no murmur, click, rub or gallop Lungs - chest clear, no wheezing, rales, normal symmetric air entry Abdomen - soft, non-tender; bowel sounds normal; no masses,  no organomegaly Extremities - no joint deformities, effusion, or inflammation and no edema  Neurologic Examination: Mental Status: Alert, oriented, thought content appropriate.  Speech fluent without evidence of aphasia. Able to follow commands without difficulty. Cranial Nerves: II-Visual fields were normal. III/IV/VI-Pupils were equal and reacted normally to light. Extraocular movements were  full and conjugate.    V/VII-no facial numbness and no facial weakness. VIII-normal. X-normal speech and symmetrical palatal movement. XI: trapezius strength/neck flexion strength normal bilaterally XII-midline tongue extension with normal strength. Motor: Moderately severe weakness and increased tone in right upper extremity; mild weakness with slight increased tone of right lower extremity; normal strength and tone of left extremities. Sensory: Normal throughout. Deep Tendon Reflexes: 2+ and asymmetric with greater responses elicited from right extremities compared to left extremities. Plantars: Mute bilaterally Cerebellar: Normal finger-to-nose testing with use of left upper extremity. Carotid auscultation: Normal  Results for orders placed or performed during the hospital encounter of 09/13/14 (from the past 48 hour(s))  CBC     Status: Abnormal   Collection Time: 09/13/14  1:08 PM  Result Value Ref Range   WBC 6.5 4.0 - 10.5 K/uL   RBC 5.18 4.22 - 5.81 MIL/uL   Hemoglobin 15.2 13.0 - 17.0 g/dL   HCT 44.4 39.0 - 52.0 %   MCV 85.7 78.0 - 100.0 fL   MCH 29.3 26.0 - 34.0 pg   MCHC 34.2 30.0 - 36.0 g/dL   RDW 13.6 11.5 - 15.5 %   Platelets 144 (L) 150 - 400 K/uL  Comprehensive metabolic panel     Status: Abnormal   Collection Time: 09/13/14  1:08 PM  Result Value Ref Range   Sodium 138 135 - 145 mmol/L   Potassium 3.4 (L) 3.5 - 5.1 mmol/L   Chloride 100 (L) 101 - 111 mmol/L   CO2 30 22 - 32 mmol/L   Glucose, Bld 175 (H) 65 - 99 mg/dL   BUN 11 6 - 20 mg/dL   Creatinine, Ser 0.93 0.61 - 1.24 mg/dL   Calcium 9.6 8.9 - 10.3 mg/dL   Total Protein 6.5 6.5 - 8.1 g/dL   Albumin 3.9 3.5 - 5.0 g/dL   AST 20 15 - 41 U/L   ALT 26 17 - 63 U/L   Alkaline Phosphatase 99 38 - 126 U/L   Total Bilirubin 0.7 0.3 - 1.2 mg/dL   GFR calc non Af Amer >60 >60 mL/min   GFR calc Af Amer >60 >60 mL/min    Comment: (NOTE) The eGFR has been calculated using the CKD EPI equation. This calculation  has not been validated in all clinical situations. eGFR's persistently <60 mL/min signify possible Chronic Kidney Disease.    Anion gap 8 5 - 15  Troponin I     Status: None   Collection Time: 09/13/14  1:08 PM  Result Value Ref Range   Troponin I <0.03 <0.031 ng/mL    Comment:        NO INDICATION OF MYOCARDIAL INJURY.   CBG monitoring, ED     Status: Abnormal   Collection Time: 09/13/14  1:56 PM  Result Value Ref Range   Glucose-Capillary 167 (H) 65 - 99 mg/dL  Urinalysis, Routine w reflex microscopic (not at [facility])     Status: None   Collection Time: 09/13/14  3:20 PM  Result Value Ref Range   Color, Urine YELLOW YELLOW   APPearance CLEAR CLEAR   Specific Gravity, Urine 1.010 1.005 - 1.030   pH 5.5 5.0 - 8.0   Glucose, UA NEGATIVE NEGATIVE mg/dL   Hgb urine dipstick NEGATIVE NEGATIVE   Bilirubin Urine NEGATIVE NEGATIVE   Ketones, ur NEGATIVE NEGATIVE mg/dL   Protein, ur NEGATIVE NEGATIVE mg/dL   Urobilinogen, UA 0.2 0.0 - 1.0 mg/dL   Nitrite NEGATIVE NEGATIVE   Leukocytes, UA NEGATIVE NEGATIVE    Comment: MICROSCOPIC NOT DONE ON URINES WITH NEGATIVE PROTEIN, BLOOD, LEUKOCYTES, NITRITE, OR GLUCOSE <1000 mg/dL.   Dg Chest 2 View  09/13/2014   CLINICAL DATA:  stroke back in March, released from rehab 2 weeks ago, went to [place] and had meds adjusted on Tues. Started on zoloft 1 1/2 weeks ago. Had a bad dream midday yesterday and since then has woke up disoriented and scared. Hasn't been able to articulate clearly as he had been for the past month. Pt is confused and unable to find words- weakness on right side  EXAM: CHEST - 2 VIEW  COMPARISON:  06/23/2014  FINDINGS: Lungs are clear. Resolution of lower lobe opacity seen previously. Atheromatous aorta. Heart size and mediastinal contours are within normal limits. No effusion.  No pneumothorax. Visualized skeletal structures are unremarkable.  IMPRESSION: No acute cardiopulmonary disease.   Electronically Signed   By: Lucrezia Europe M.D.    On: 09/13/2014 14:48   Ct Head Wo Contrast  09/13/2014   CLINICAL DATA:  74 year old male with altered mental status, generalized weakness and dizziness.  EXAM: CT HEAD WITHOUT CONTRAST  TECHNIQUE: Contiguous axial images were obtained from the base of the skull through the vertex without intravenous contrast.  COMPARISON:  05/30/2014 and 05/14/2013 CTs  FINDINGS: Encephalomalacia in the left  thalamus/internal capsule noted in area of prior hemorrhage. Right cerebellar calcification is unchanged. Mild chronic small-vessel white matter ischemic changes are again noted.  No acute intracranial abnormalities are identified, including mass lesion or mass effect, hydrocephalus, extra-axial fluid collection, midline shift, hemorrhage, or acute infarction.  The visualized bony calvarium is unremarkable.  IMPRESSION: No evidence of acute intracranial abnormality.   Electronically Signed   By: Margarette Canada M.D.   On: 09/13/2014 14:38   Mr Brain Wo Contrast  09/13/2014   CLINICAL DATA:  Abnormal speech.  Aphasia.  EXAM: MRI HEAD WITHOUT CONTRAST  TECHNIQUE: Multiplanar, multiecho pulse sequences of the brain and surrounding structures were obtained without intravenous contrast.  COMPARISON:  CT head 09/13/2014.  MRI 05/31/2014  FINDINGS: Negative for acute ischemic infarction  Relatively large area of chronic hemorrhagic infarction left thalamus. Chronic microvascular ischemic changes in the white matter.  Negative for mass or edema.  No shift of the midline structures  Pituitary normal in size.  Paranasal sinuses clear.  Orbit negative.  IMPRESSION: No acute intracranial abnormality. Chronic hemorrhagic infarction left thalamus.   Electronically Signed   By: Franchot Gallo M.D.   On: 09/13/2014 16:34    Assessment: 74 y.o. male with previous stroke as well as significant risk factors for stroke presenting with transient speech output difficulty and dysarthria as well has confu should also be can partial seizure sion.  Etiology is unclear. MRI showed no signs of recurrent stroke on file was following onset of symptoms. Symptoms subsequently resolved. TIA is unlikely but cannot be completely ruled out. As well, possible partial seizure as etiology for her speech changes and confusion should also be considered.  Stroke Risk Factors - diabetes mellitus, hyperlipidemia and hypertension  Recommendations: 1. EEG, routine adult study on outpatient basis 2. Prophylactic therapy-Antiplatelet med: Recommend switching from aspirin to Plavix 75 mg per day 3. No anticonvulsive medication indicated at this point 4. Neurology follow-up at [facility] hospital as planned.  C.R. Nicole Kindred, MD [facility] [phone].  09/13/2014, 5:01 PM

## 2014-09-14 DIAGNOSIS — R471 Dysarthria and anarthria: Secondary | ICD-10-CM

## 2014-09-14 DIAGNOSIS — I69851 Hemiplegia and hemiparesis following other cerebrovascular disease affecting right dominant side: Secondary | ICD-10-CM

## 2014-09-14 DIAGNOSIS — E785 Hyperlipidemia, unspecified: Secondary | ICD-10-CM

## 2014-09-14 DIAGNOSIS — I2510 Atherosclerotic heart disease of native coronary artery without angina pectoris: Secondary | ICD-10-CM

## 2014-09-14 DIAGNOSIS — I6350 Cerebral infarction due to unspecified occlusion or stenosis of unspecified cerebral artery: Secondary | ICD-10-CM

## 2014-09-14 DIAGNOSIS — F329 Major depressive disorder, single episode, unspecified: Secondary | ICD-10-CM

## 2014-09-14 DIAGNOSIS — D696 Thrombocytopenia, unspecified: Secondary | ICD-10-CM

## 2014-09-14 DIAGNOSIS — E1165 Type 2 diabetes mellitus with hyperglycemia: Secondary | ICD-10-CM

## 2014-09-14 DIAGNOSIS — I10 Essential (primary) hypertension: Secondary | ICD-10-CM

## 2014-09-14 DIAGNOSIS — E1140 Type 2 diabetes mellitus with diabetic neuropathy, unspecified: Secondary | ICD-10-CM

## 2014-09-14 DIAGNOSIS — E860 Dehydration: Secondary | ICD-10-CM

## 2014-09-14 DIAGNOSIS — R4701 Aphasia: Principal | ICD-10-CM

## 2014-09-14 DIAGNOSIS — I5032 Chronic diastolic (congestive) heart failure: Secondary | ICD-10-CM

## 2014-09-14 LAB — GLUCOSE, CAPILLARY: Glucose-Capillary: 172 mg/dL — ABNORMAL HIGH (ref 65–99)

## 2014-09-14 LAB — LIPID PANEL
CHOL/HDL RATIO: 5 ratio
Cholesterol: 145 mg/dL (ref 0–200)
HDL: 29 mg/dL — AB (ref >40)
LDL Cholesterol: 73 mg/dL (ref 0–99)
Triglycerides: 216 mg/dL — ABNORMAL HIGH (ref <150)
VLDL: 43 mg/dL — AB (ref 0–40)

## 2014-09-14 MED ORDER — CLOPIDOGREL BISULFATE 75 MG PO TABS: 75.0000 mg | ORAL_TABLET | Freq: Every day | ORAL | Status: DC

## 2014-09-14 MED ORDER — POTASSIUM CHLORIDE CRYS ER 20 MEQ PO TBCR
60.0000 meq | EXTENDED_RELEASE_TABLET | Freq: Once | ORAL | Status: AC
  Administered 2014-09-14: 60 meq via ORAL
  Filled 2014-09-14: qty 3

## 2014-09-14 NOTE — Discharge Summary (Signed)
Physician Discharge Summary  Jeffrey [name] HGD:[id number] DOB: 22-May-1940 DOA: 09/13/2014  PCP: [name], MD  Admit date: 09/13/2014 Discharge date: 09/14/2014  Time spent: 40 minutes  Recommendations for Outpatient Follow-up:  1. Follow-up with primary care physician within one week. 2. Follow-up with the [facility] neurology, patient needs carotid duplex to be done. Discussed with son and daughter at bedside.  Discharge Diagnoses:  Principal Problem:   Aphasia Active Problems:   Essential hypertension   Hyperlipidemia   Type 2 diabetes, uncontrolled, with neuropathy   CAD (coronary artery disease)   Thrombocytopenia   Chronic diastolic heart failure, NYHA class 1   Hemiparesis affecting right side as late effect of cerebrovascular accident   Depression due to stroke   Dehydration   Hypokalemia   Dysarthria   Discharge Condition: Stable  Diet recommendation: Heart healthy diet  Filed Weights   09/13/14 1246 09/13/14 1900  Weight: 77.111 kg (170 lb) 72.53 kg (159 lb 14.4 oz)    History of present illness:  This is a 74 year old male patient who had left thalamic and internal capsule hemorrhagic stroke in April 2016. He was subsequently discharged initially to CIR but due to slow progress was later discharged to [facility] for additional rehabilitative therapy. He was subsequently discharged from that facility on 6/22. Patient has residual right hemiparesis and right facial weakness. He has a history of diabetes with neuropathy, intermittent recurring thrombocytopenia, chronic diastolic heart failure, dyslipidemia, CAD as well as depression post stroke. He was evaluated at his primary care physician's office on 7/1 and was started on low-dose Zoloft 25 mg daily for stroke. Other medical problems remain stable. His family brought him to the ER today after noticing him developing episodic aphasia that began on 7/8. The wife reported to the ED. The patient woke up from the bedroom  and called her by the wrong name on 7/8 and patient has had any improvement in his speech pattern since that time. According to the family the patient's initial stroke related aphasia and dysarthria had resolved and would only recur if the patient was tired patient has apparently not had any other symptoms such as acute infectious illnesses, nausea vomiting diarrhea, fevers, new pain.  In the ER patient was afebrile, heart rate was 70 bpm, blood pressure is 127/69, room air saturations were 96%. Two view chest x-ray showed no acute cardiopulmonary disease. CT of the head without contrast revealed no evidence of acute intracranial abnormality and demonstrated encephalomalacia in the left thalamic and internal capsule area consistent with prior hemorrhage. Laboratory data unremarkable except for mild hypokalemia potassium 3.4 and mild hyperglycemia with glucose 175. Upon was less than 0.03. CBC was normal except for platelets 144,000. In review of old labs patient has a history of intermittent thrombocytopenia with platelets as low as 101,000 with most recent prior to this admission 146,000 in May. EDP spoke with Dr. Nicole Kindred in neurology who recommended an MRI. In further discussion with the patient and his wife he has not had any infectious symptoms fevers chills nausea vomiting diarrhea. Patient reports that he could clearly understand words spoken to him that he was having trouble formulating the correct words to speak earlier; at present he feels symptoms are improving and his wife agrees. Patient endorses he has not been eating as well as he should and has not had much of an appetite.   Hospital Course:    New Aphasia/dysarthria in patient with prior hemorrhagic stroke with residual right hemiparesis -This is resolved  prior to admission to the hospital, seen by neurology in the ED. -Neurology recommended to switch aspirin to Plavix and patient can be discharged from the emergency department. -Further  discussion with neurology in review of the MRIs from March 2016 showed patient might have hemorrhagic -transformation of thromboembolic infarct. Recommended Plavix 75 mg daily. -At that time the hemorrhagic stroke thought to be secondary to hypertension, likely its hemorrhagic transformation of thromboembolic infarct, per MRI reading. -Follow-up with neurology as outpatient, patient needs carotid duplex to be done. Rest of stroke workup was done in March. -Patient does not want stay in the hospital for any further workup.   Dehydration/Hypokalemia -Gentle IV fluid hydration -given 60 mEq of potassium prior to discharge.    Essential hypertension -Current blood pressure well controlled -Continue Cozaar and Lopressor once swallow evaluation completed   Type 2 diabetes, uncontrolled, with neuropathy -Hemoglobin A1c 7.8 July 1 -Continue preadmission Lantus -Check CBG AC/HS and provide sliding scale coverage -No evidence of hypoglycemia at time of symptoms or since arrival   Thrombocytopenia -Chronic and recurrent noting platelets are greater than 100,000   Chronic diastolic heart failure, NYHA class 1 -Appears compensated out symptoms of heart failure   Hyperlipidemia -Was on statin prior to admission and can resume once patient passes a swallowing evaluation   CAD  -Currently without symptoms and EKG unremarkable as was troponin -Beta Blocker as above   Depression due to stroke -Recent start low dose Zoloft on 7/1 -MRI did not reveal new stroke-consider discontinuing Zoloft in the event this is causing patient's aphasia and dysarthria   BPH -Continue Flomax after pass a swallowing evaluation   Procedures:  None  Consultations:  Neurology  Discharge Exam: Filed Vitals:   09/14/14 0928  BP: 157/80  Pulse: 88  Temp: 98.8 F (37.1 C)  Resp: 18   General: Alert and awake, oriented x3, not in any acute distress. HEENT: anicteric sclera, pupils reactive to  light and accommodation, EOMI CVS: S1-S2 clear, no murmur rubs or gallops Chest: clear to auscultation bilaterally, no wheezing, rales or rhonchi Abdomen: soft nontender, nondistended, normal bowel sounds, no organomegaly Extremities: no cyanosis, clubbing or edema noted bilaterally Neuro: Cranial nerves II-XII intact, right upper extremity weakness  Discharge Instructions   Discharge Instructions    Diet - low sodium heart healthy    Complete by:  As directed      Increase activity slowly    Complete by:  As directed           Discharge Medication List as of 09/14/2014  9:44 AM    START taking these medications   Details  clopidogrel (PLAVIX) 75 MG tablet Take 1 tablet (75 mg total) by mouth daily., Starting 09/14/2014, Until Discontinued, Print      CONTINUE these medications which have NOT CHANGED   Details  atorvastatin (LIPITOR) 80 MG tablet Take 80 mg by mouth at bedtime., Until Discontinued, Historical Med    baclofen (LIORESAL) 10 MG tablet Take 5 mg by mouth 3 (three) times daily as needed for muscle spasms (TAKES AT BREAKFAST, CAN TAKE TWO MORE TIMES AS NEEDED). Daily in am, may have additional dose if needed for muscle spasm, Until Discontinued, Historical Med    Cholecalciferol 1000 UNITS tablet Take 1,000 Units by mouth daily., Until Discontinued, Historical Med    docusate sodium (COLACE) 100 MG capsule Take 100 mg by mouth 2 (two) times daily. , Until Discontinued, Historical Med    fosinopril ([name]) 40 MG tablet Take  40 mg by mouth daily., Until Discontinued, Historical Med    insulin glargine (LANTUS) 100 UNIT/ML injection Inject 18 Units into the skin at bedtime. , Until Discontinued, Historical Med    insulin lispro (HUMALOG) 100 UNIT/ML injection Inject 10 Units into the skin 3 (three) times daily before meals., Until Discontinued, Historical Med    LACTOBACILLUS PO Take 1 capsule by mouth 3 (three) times daily. [facility] colon [facility], Until Discontinued,  Historical Med    loratadine (CLARITIN) 10 MG tablet Take 10 mg by mouth daily., Until Discontinued, Historical Med    LORazepam (ATIVAN) 0.5 MG tablet 1/2 by mouth every night at bedtime schedule, HOLD FOR SEDATION, Print    metoprolol tartrate (LOPRESSOR) 25 MG tablet Take 25 mg by mouth 2 (two) times daily. Hold if B/P < 100/60 or HR< 60, Until Discontinued, Historical Med    senna (SENOKOT) 8.6 MG tablet Take 1 tablet by mouth at bedtime., Until Discontinued, Historical Med    sertraline (ZOLOFT) 25 MG tablet Take 1 tablet (25 mg total) by mouth daily., Starting 09/05/2014, Until Discontinued, Normal    tamsulosin (FLOMAX) 0.4 MG CAPS capsule Take 1 capsule (0.4 mg total) by mouth daily., Starting 06/11/2014, Until Discontinued, No Print      STOP taking these medications     aspirin EC 81 MG tablet      losartan (COZAAR) 50 MG tablet      pantoprazole (PROTONIX) 40 MG tablet      polyethylene glycol (MIRALAX / GLYCOLAX) packet        Allergies  Allergen Reactions  . Niacin Other (See Comments)    REACTION: intolerant,not allergic="flushing,hot flashes,turning red"   Follow-up Information    Follow up with [name], MD In 1 week.   Specialty:  Family Medicine   Contact information:   [place] [address] [phone]       Follow up with Xu,Jindong, MD In 1 week.   Specialty:  Neurology   Contact information:   [address] [place] [address] [phone]        The results of significant diagnostics from this hospitalization (including imaging, microbiology, ancillary and laboratory) are listed below for reference.    Significant Diagnostic Studies: Dg Chest 2 View  09/13/2014   CLINICAL DATA:  stroke back in March, released from rehab 2 weeks ago, went to [place] and had meds adjusted on Tues. Started on zoloft 1 1/2 weeks ago. Had a bad dream midday yesterday and since then has woke up disoriented and scared.  Hasn't been able to articulate clearly as he had been for the past month. Pt is confused and unable to find words- weakness on right side  EXAM: CHEST - 2 VIEW  COMPARISON:  06/23/2014  FINDINGS: Lungs are clear. Resolution of lower lobe opacity seen previously. Atheromatous aorta. Heart size and mediastinal contours are within normal limits. No effusion.  No pneumothorax. Visualized skeletal structures are unremarkable.  IMPRESSION: No acute cardiopulmonary disease.   Electronically Signed   By: Lucrezia Europe M.D.   On: 09/13/2014 14:48   Ct Head Wo Contrast  09/13/2014   CLINICAL DATA:  74 year old male with altered mental status, generalized weakness and dizziness.  EXAM: CT HEAD WITHOUT CONTRAST  TECHNIQUE: Contiguous axial images were obtained from the base of the skull through the vertex without intravenous contrast.  COMPARISON:  05/30/2014 and 05/14/2013 CTs  FINDINGS: Encephalomalacia in the left thalamus/internal capsule noted in area of prior  hemorrhage. Right cerebellar calcification is unchanged. Mild chronic small-vessel white matter ischemic changes are again noted.  No acute intracranial abnormalities are identified, including mass lesion or mass effect, hydrocephalus, extra-axial fluid collection, midline shift, hemorrhage, or acute infarction.  The visualized bony calvarium is unremarkable.  IMPRESSION: No evidence of acute intracranial abnormality.   Electronically Signed   By: Margarette Canada M.D.   On: 09/13/2014 14:38   Mr Brain Wo Contrast  09/13/2014   CLINICAL DATA:  Abnormal speech.  Aphasia.  EXAM: MRI HEAD WITHOUT CONTRAST  TECHNIQUE: Multiplanar, multiecho pulse sequences of the brain and surrounding structures were obtained without intravenous contrast.  COMPARISON:  CT head 09/13/2014.  MRI 05/31/2014  FINDINGS: Negative for acute ischemic infarction  Relatively large area of chronic hemorrhagic infarction left thalamus. Chronic microvascular ischemic changes in the white matter.   Negative for mass or edema.  No shift of the midline structures  Pituitary normal in size.  Paranasal sinuses clear.  Orbit negative.  IMPRESSION: No acute intracranial abnormality. Chronic hemorrhagic infarction left thalamus.   Electronically Signed   By: Franchot Gallo M.D.   On: 09/13/2014 16:34    Microbiology: No results found for this or any previous visit (from the past 240 hour(s)).   Labs: Basic Metabolic Panel:  Recent Labs Lab 09/13/14 1308  NA 138  K 3.4*  CL 100*  CO2 30  GLUCOSE 175*  BUN 11  CREATININE 0.93  CALCIUM 9.6   Liver Function Tests:  Recent Labs Lab 09/13/14 1308  AST 20  ALT 26  ALKPHOS 99  BILITOT 0.7  PROT 6.5  ALBUMIN 3.9   No results for input(s): LIPASE, AMYLASE in the last 168 hours. No results for input(s): AMMONIA in the last 168 hours. CBC:  Recent Labs Lab 09/13/14 1308  WBC 6.5  HGB 15.2  HCT 44.4  MCV 85.7  PLT 144*   Cardiac Enzymes:  Recent Labs Lab 09/13/14 1308  TROPONINI <0.03   BNP: BNP (last 3 results)  Recent Labs  06/05/14 0908  BNP 86.1    ProBNP (last 3 results) No results for input(s): PROBNP in the last 8760 hours.  CBG:  Recent Labs Lab 09/13/14 1356 09/13/14 1936 09/14/14 0659  GLUCAP 167* 144* 172*       Signed:  Aradhana Gin A  [facility] 09/14/2014, 10:22 AM

## 2014-09-14 NOTE — Progress Notes (Signed)
Discharge instructions reviewed with patient/family. All questions answered at this time. Transport home by family.   Shaira Sova, RN 

## 2014-09-15 ENCOUNTER — Telehealth: Payer: Self-pay

## 2014-09-15 LAB — HEMOGLOBIN A1C
Hgb A1c MFr Bld: 7.9 % — ABNORMAL HIGH (ref 4.8–5.6)
Mean Plasma Glucose: 180 mg/dL

## 2014-09-15 NOTE — Telephone Encounter (Signed)
Pt seen 09/13/14 [place] and admitted.

## 2014-09-15 NOTE — Telephone Encounter (Signed)
PLEASE NOTE: All timestamps contained within this report are represented as [place] Standard Time. CONFIDENTIALTY NOTICE: This fax transmission is intended only for the addressee. It contains information that is legally privileged, confidential or otherwise protected from use or disclosure. If you are not the intended recipient, you are strictly prohibited from reviewing, disclosing, copying using or disseminating any of this information or taking any action in reliance on or regarding this information. If you have received this fax in error, please notify us immediately by telephone so that we can arrange for its return to [place]. Phone: [phone], Toll-Free: [phone], Fax: [phone] Page: 1 of 3 Call Id: [id number] [place] Patient Name: Jeffrey [name] Gender: Male DOB: Feb 26, 1941 Age: 74 Y 39 M 27 D Return Phone Number: [phone] (Primary) Address: City/State/Zip: [place] Client [place] Night - Client Client Site [place] Physician [name] Contact Type Call Call Type Triage / Clinical Caller Name John Napierala Relationship To Patient Son Return Phone Number [phone] (Primary) Chief Complaint Medication reaction Initial Comment caller states his father is on 2 different anti depressants - son is concerned that he does not need to be taking them both at the same time PreDisposition InappropriateToAsk Nurse Assessment Nurse: Vevelyn Royals, RN, Verdis Frederickson Date/Time (Eastern Time): 09/13/2014 11:27:14 AM Confirm and document reason for call. If symptomatic, describe symptoms. ---Caller states his father is on 2 different anti depressants - son is concerned that he does not need to be taking them both at the same time. Patient gives permission to speak with his son. Taking Lorazepam 0.25 mg at bedtime and Zoloft 25 mg at noon.  Since yesterday having dreams and waking up disoriented. New onset of confusion. New medications in rehab facility because he was having a lot of anxiety. Has the patient traveled out of the country within the last 30 days? ---No Does the patient require triage? ---Yes Related visit to physician within the last 2 weeks? ---Yes Patient seen at the [place] on Tuesday and started Zoloft. Does the PT have any chronic conditions? (i.e. diabetes, asthma, etc.) ---Yes List chronic conditions. ---recent stroke diabetic hx of heart attack 25 years ago Guidelines Guideline Title Affirmed Question Affirmed Notes Nurse Date/Time (Eastern Time) Confusion - Delirium [1] Difficult to awaken or acting confused (disoriented, slurred speech) AND [2] present now AND [3] diabetic BS this am is 189 at 8:45 am. 10 units of Humalog given. New onset of confusion over last 2 days. Vevelyn Royals, RN, Verdis Frederickson 09/13/2014 11:34:43 AM Disp. Time Eilene Ghazi Time) Disposition Final User 09/13/2014 11:42:28 AM 911 Outcome Documentation Vevelyn Royals, RN, Verdis Frederickson PLEASE NOTE: All timestamps contained within this report are represented as [place] Standard Time. CONFIDENTIALTY NOTICE: This fax transmission is intended only for the addressee. It contains information that is legally privileged, confidential or otherwise protected from use or disclosure. If you are not the intended recipient, you are strictly prohibited from reviewing, disclosing, copying using or disseminating any of this information or taking any action in reliance on or regarding this information. If you have received this fax in error, please notify us immediately by telephone so that we can arrange for its return to [place]. Phone: [phone], Toll-Free: [phone], Fax: [phone] Page: 2 of 3 Call Id: [id number] [place]. Time (Eastern Time) Disposition Final User Reason: Caller states he does not plan to call 911. States he will stop giving the Zoloft and schedule appt for  patient for next week. Nurse encouraged caller to get immediate medical attention for patient as he has a new onset of confusion over last 2 days and history of a recent stroke. Has been home 2 weeks. Caller states he understands, but does not plan to have patient seen at this time. 09/13/2014 11:44:18 AM Paged On Call back to [facility], [place], Verdis Frederickson 09/13/2014 12:01:39 PM Paged On Call back to [facility], [place], Verdis Frederickson 09/13/2014 12:17:50 PM Paged On Call back to [facility], [place], Verdis Frederickson 09/13/2014 12:45:36 PM Call Completed Vevelyn Royals, RN, Verdis Frederickson 09/13/2014 11:40:14 AM Call EMS 911 Now Yes Vevelyn Royals, RN, Ramond Craver Understands: Yes Disagree/Comply: Disagree Disagree/Comply Reason: Disagree with instructions Care Advice Given Per Guideline CALL EMS 911 NOW: Immediate medical attention is needed. You need to hang up and call 911 (or an ambulance). Psychologist, forensic Discretion: I'll call you back in a few minutes to be sure you were able to reach them.) NOTE TO TRIAGER: Don't worry about giving GLUCOSE to a patient whose blood glucose is unknown (and could be high); if it turns out the blood glucose is high, the hospital can treat this easily. CARE ADVICE given per Confusion-Delirium (Adult) guideline. After Care Instructions Given Call Event Type User Date / Time Description Paging DoctorName Phone DateTime Result/Outcome Message Type Notes Penni Homans [phone] 09/13/2014 11:44:18 AM Paged On Call Back to Call Center Doctor Paged Please call Verdis Frederickson at [phone]. Penni Homans [phone] 09/13/2014 12:01:38 PM Paged On Call Back to Call Center Doctor Paged Penni Homans [phone] 09/13/2014 12:17:50 PM Paged On Call Back to Call Center Doctor Paged Penni Homans 09/13/2014 12:45:22 PM Spoke with On Call - Outcome Notification Message Result Spoke with Santiago Glad, nurse, for Dr. Charlett Blake. Informed of patient's symptoms, caller's concerns and that caller indicated there were some  speech issues that were different and new onset of confusion and that caller planned to stop giving the Zoloft. Informed that nurse discouraged abruptly stopping PLEASE NOTE: All timestamps contained within this report are represented as [place] Standard Time. CONFIDENTIALTY NOTICE: This fax transmission is intended only for the addressee. It contains information that is legally privileged, confidential or otherwise protected from use or disclosure. If you are not the intended recipient, you are strictly prohibited from reviewing, disclosing, copying using or disseminating any of this information or taking any action in reliance on or regarding this information. If you have received this fax in error, please notify us immediately by telephone so that we can arrange for its return to [place]. Phone: [phone], Toll-Free: [phone], Fax: [phone] Page: 3 of 3 Call Id: [id number] Paging DoctorName Phone DateTime Result/Outcome Message Type Notes medication unless seen by provider and advised to do so. Aware that caller did not plan to call 911 or have patient seen by provider at this time. Nurse concerned due to hx of recent stroke. Santiago Glad will inform Dr. Charlett Blake. Dr. Charlett Blake in office at this time.

## 2014-09-15 NOTE — Telephone Encounter (Signed)
Transition Care Management Follow-up Telephone Call   Date discharged? 09/14/14   How have you been since you were released from the hospital? Wife reports patient appears to be back to baseline.   Do you understand why you were in the hospital? yes   Do you understand the discharge instructions? yes   Where were you discharged to? Home   Items Reviewed:  Medications reviewed: yes  Allergies reviewed: yes  Dietary changes reviewed: yes, low-sodium/heart healthy  Referrals reviewed: yes, neurology   Functional Questionnaire:   Activities of Daily Living (ADLs):   He states they are independent in the following: bathing and hygiene States they require assistance with the following: ambulation, feeding, continence, grooming, toileting and dressing   Any transportation issues/concerns?: no   Any patient concerns? no   Confirmed importance and date/time of follow-up visits scheduled yes, 09/18/14 @ 1230  Provider Appointment booked with [name], MD  Confirmed with patient if condition begins to worsen call PCP or go to the ER.  Patient was given the office number and encouraged to call back with question or concerns.  : yes

## 2014-09-16 ENCOUNTER — Telehealth: Payer: Self-pay | Admitting: Family Medicine

## 2014-09-16 NOTE — Telephone Encounter (Signed)
Transitional care call done 7/11.  See previous message.

## 2014-09-16 NOTE — Telephone Encounter (Signed)
.  Patient discharged on 7/10 for new aphasia, possibly related to prior CVA.  plz call for hosp f/u phone call and schedule f/u appt in 1-2 week(s).  Thanks.

## 2014-09-18 ENCOUNTER — Ambulatory Visit (INDEPENDENT_AMBULATORY_CARE_PROVIDER_SITE_OTHER): Payer: Medicare Other | Admitting: Family Medicine

## 2014-09-18 ENCOUNTER — Encounter: Payer: Self-pay | Admitting: Family Medicine

## 2014-09-18 VITALS — BP 104/58 | HR 95 | Temp 98.5°F | Wt 159.5 lb

## 2014-09-18 DIAGNOSIS — E1142 Type 2 diabetes mellitus with diabetic polyneuropathy: Secondary | ICD-10-CM

## 2014-09-18 DIAGNOSIS — I69851 Hemiplegia and hemiparesis following other cerebrovascular disease affecting right dominant side: Secondary | ICD-10-CM | POA: Diagnosis not present

## 2014-09-18 DIAGNOSIS — F329 Major depressive disorder, single episode, unspecified: Secondary | ICD-10-CM

## 2014-09-18 DIAGNOSIS — I69351 Hemiplegia and hemiparesis following cerebral infarction affecting right dominant side: Secondary | ICD-10-CM

## 2014-09-18 DIAGNOSIS — E1165 Type 2 diabetes mellitus with hyperglycemia: Secondary | ICD-10-CM | POA: Diagnosis not present

## 2014-09-18 DIAGNOSIS — I10 Essential (primary) hypertension: Secondary | ICD-10-CM

## 2014-09-18 DIAGNOSIS — E1140 Type 2 diabetes mellitus with diabetic neuropathy, unspecified: Secondary | ICD-10-CM

## 2014-09-18 DIAGNOSIS — I6350 Cerebral infarction due to unspecified occlusion or stenosis of unspecified cerebral artery: Secondary | ICD-10-CM

## 2014-09-18 DIAGNOSIS — IMO0002 Reserved for concepts with insufficient information to code with codable children: Secondary | ICD-10-CM

## 2014-09-18 NOTE — Assessment & Plan Note (Signed)
Continue sertraline 25mg  daily. Pt seems to be tolerating well. Doubt related to recent sxs as he has continued taking and has no further episodes of aphasia/dysarthria or confusion.

## 2014-09-18 NOTE — Assessment & Plan Note (Addendum)
Followed by endo. Has appt for 10/06/2014.

## 2014-09-18 NOTE — Patient Instructions (Addendum)
Stop aspirin. Continue plavix 75mg  daily.  Continue zoloft 25mg  once daily.  Let's weigh today. Return in 3 months for follow up. Keep follow up with Dr Buddy Duty.

## 2014-09-18 NOTE — Assessment & Plan Note (Signed)
bp somewhat low today but running ok at home. No changes today. Added lasix 40mg  to med list which pt has been taking all along.  BP Readings from Last 3 Encounters:  09/18/14 104/58  09/14/14 157/80  09/05/14 132/58

## 2014-09-18 NOTE — Assessment & Plan Note (Addendum)
Aware to stay well hydrated.  Aspirin was changed to plavix by neurologist in hospital. [facility] PT/OT/ST working with patient. Some improvements in strength noted as well. [facility].

## 2014-09-18 NOTE — Progress Notes (Signed)
Pre visit review using our clinic review tool, if applicable. No additional management support is needed unless otherwise documented below in the visit note. 

## 2014-09-18 NOTE — Progress Notes (Signed)
BP 104/58 mmHg  Pulse 95  Temp(Src) 98.5 F (36.9 C) (Oral)  Wt 159 lb 8 oz (72.349 kg)   CC: hosp f/u visit  Subjective:    Patient ID: Jeffrey [name], male    DOB: March 28, 1940, 74 y.o.   MRN: [id number]  HPI: Jeffrey [name] is a 74 y.o. male presenting on 09/18/2014 for Follow-up   See prior note for details suffered L thalamic and IC hemorrhagic stroke 4/206. Residual R sided hemiparesis. On 09/12/2014 noted new aphasia/dysarthria and confusion with wife's name, sent back to ER with hospitalization. MRI did not show new stroke. Neurology did not see any new evidence of stroke but possible hemorrhagic transformation of prior thromboembolic infarct. rec change aspirin to plavix. Planned f/u with [place] neurology, will need carotid US.   ?zoloft causing aphasia/dysarthria.   Awaiting call back from [place] for f/u neurology.   Appetite picking up.   HH involved at home (PT, OT , ST).  Admit date: 09/13/2014  Discharge date: 09/14/2014  F/u phone call: 09/16/2014 Recommendations for Outpatient Follow-up:  1. Follow-up with primary care physician within one week. 2. Follow-up with the [facility] neurology, patient needs carotid duplex to be done. Discussed with son and daughter at bedside. Discharge Diagnoses:  Principal Problem:  Aphasia  Active Problems:  Essential hypertension  Hyperlipidemia  Type 2 diabetes, uncontrolled, with neuropathy  CAD (coronary artery disease)  Thrombocytopenia  Chronic diastolic heart failure, NYHA class 1  Hemiparesis affecting right side as late effect of cerebrovascular accident  Depression due to stroke  Dehydration  Hypokalemia  Dysarthria   Relevant past medical, surgical, family and social history reviewed and updated as indicated. Interim medical history since our last visit reviewed. Allergies and medications reviewed and updated. Current Outpatient Prescriptions on File Prior to Visit  Medication Sig  . atorvastatin (LIPITOR) 80 MG tablet Take 80  mg by mouth at bedtime.  . baclofen (LIORESAL) 10 MG tablet Take 5 mg by mouth 3 (three) times daily as needed for muscle spasms (TAKES AT BREAKFAST, CAN TAKE TWO MORE TIMES AS NEEDED). Daily in am, may have additional dose if needed for muscle spasm  . Cholecalciferol 1000 UNITS tablet Take 1,000 Units by mouth daily.  . clopidogrel (PLAVIX) 75 MG tablet Take 1 tablet (75 mg total) by mouth daily.  [name] docusate sodium (COLACE) 100 MG capsule Take 100 mg by mouth 2 (two) times daily.   . fosinopril ([name]) 40 MG tablet Take 40 mg by mouth daily.  . insulin glargine (LANTUS) 100 UNIT/ML injection Inject 18 Units into the skin at bedtime.   . insulin lispro (HUMALOG) 100 UNIT/ML injection Inject 10 Units into the skin 3 (three) times daily before meals.  [name] LACTOBACILLUS PO Take 1 capsule by mouth 3 (three) times daily. philips colon health  . loratadine (CLARITIN) 10 MG tablet Take 10 mg by mouth daily.  [name] LORazepam (ATIVAN) 0.5 MG tablet 1/2 by mouth every night at bedtime schedule, HOLD FOR SEDATION  . metoprolol tartrate (LOPRESSOR) 25 MG tablet Take 25 mg by mouth 2 (two) times daily. Hold if B/P < 100/60 or HR< 60  . senna (SENOKOT) 8.6 MG tablet Take 1 tablet by mouth at bedtime.  . sertraline (ZOLOFT) 25 MG tablet Take 1 tablet (25 mg total) by mouth daily.  . tamsulosin (FLOMAX) 0.4 MG CAPS capsule Take 1 capsule (0.4 mg total) by mouth daily. (Patient taking differently: Take 0.4 mg by mouth daily after supper. )  No current facility-administered medications on file prior to visit.    Review of Systems Per HPI unless specifically indicated above     Objective:    BP 104/58 mmHg  Pulse 95  Temp(Src) 98.5 F (36.9 C) (Oral)  Wt 159 lb 8 oz (72.349 kg)  Wt Readings from Last 3 Encounters:  09/18/14 159 lb 8 oz (72.349 kg)  09/13/14 159 lb 14.4 oz (72.53 kg)  07/30/14 167 lb 9.6 oz (76.023 kg)    Physical Exam  Constitutional: He appears well-developed and well-nourished. No  distress.  HENT:  Mouth/Throat: Oropharynx is clear and moist. No oropharyngeal exudate.  Eyes: Conjunctivae and EOM are normal. Pupils are equal, round, and reactive to light.  Neck: Normal range of motion. Neck supple.  Cardiovascular: Normal rate, regular rhythm, normal heart sounds and intact distal pulses.   No murmur heard. Pulmonary/Chest: Effort normal and breath sounds normal. No respiratory distress. He has no wheezes. He has no rales.  Musculoskeletal: He exhibits no edema.  Neurological:  R hemiparesis but grip strength seems improved  Skin: Skin is warm and dry. No rash noted.  Psychiatric: He has a normal mood and affect.  Brighter affect today, good spirits  Nursing note and vitals reviewed.  Lab Results  Component Value Date   K 3.4* 09/13/2014    Lab Results  Component Value Date   CREATININE 0.93 09/13/2014        Assessment & Plan:   Problem List Items Addressed This Visit    Depression due to stroke    Continue sertraline 25mg  daily. Pt seems to be tolerating well. Doubt related to recent sxs as he has continued taking and has no further episodes of aphasia/dysarthria or confusion.      Essential hypertension    bp somewhat low today but running ok at home. No changes today. Added lasix 40mg  to med list which pt has been taking all along.  BP Readings from Last 3 Encounters:  09/18/14 104/58  09/14/14 157/80  09/05/14 132/58        Relevant Medications   furosemide (LASIX) 40 MG tablet   Hemiparesis affecting right side as late effect of cerebrovascular accident - Primary    Aware to stay well hydrated.  Aspirin was changed to plavix by neurologist in hospital. [facility] PT[facility]OT[facility] working with patient. Some improvements in strength noted as well. [facility].      Type 2 diabetes, uncontrolled, with neuropathy    Followed by endo. Has appt for 10/06/2014.          Follow up plan: Return in about 3 months (around  12/19/2014), or as needed, for follow up visit.

## 2014-09-24 ENCOUNTER — Telehealth: Payer: Self-pay | Admitting: Family Medicine

## 2014-09-24 NOTE — Telephone Encounter (Signed)
Romie Minus (spouse) called stating mr Wall was summons to jury duty 11/11/14.  They would like a letter to get mr Mceachin dismissed from jury duty due to his health

## 2014-09-26 NOTE — Telephone Encounter (Signed)
Okay to send letter to dismiss from jury duty?  Please advise.

## 2014-09-26 NOTE — Telephone Encounter (Signed)
Letter done and in IN box 

## 2014-09-26 NOTE — Telephone Encounter (Signed)
Patient notified that letter was ready and will be sent out in mail.

## 2014-10-02 ENCOUNTER — Telehealth: Payer: Self-pay

## 2014-10-02 ENCOUNTER — Other Ambulatory Visit: Payer: Self-pay | Admitting: Internal Medicine

## 2014-10-02 NOTE — Telephone Encounter (Signed)
Jeff advised. 

## 2014-10-02 NOTE — Telephone Encounter (Signed)
Noted.  We can await the endocrine appointment. Please update [place] if worse in the meantime.  Thanks.

## 2014-10-02 NOTE — Telephone Encounter (Signed)
Merry Proud nurse with [facility] left v/m as Juluis Rainier; pt has small rash 1 cm x 1 cm on arch of rt foot, no drainage, slightly raised and red; pt has appt with endocrinologist 10/06/14 and will show rash to endo. Merry Proud wanted PCP to be aware and Merry Proud request cb.

## 2014-10-03 ENCOUNTER — Other Ambulatory Visit: Payer: Self-pay

## 2014-10-03 NOTE — Telephone Encounter (Signed)
[name] pts daughter (No DPR signed for daughter) left v/m requesting refill plavix and lorazepam. Pt seen 09/18/14; note that plavix started by neurologist in hospital and lorazepam on med list has hold for sedation.Please advise.

## 2014-10-05 NOTE — Telephone Encounter (Signed)
I'm not certain about the lorazepam- I'll defer to PCP.  Routed.

## 2014-10-06 MED ORDER — CLOPIDOGREL BISULFATE 75 MG PO TABS: 75.0000 mg | ORAL_TABLET | Freq: Every day | ORAL | Status: DC

## 2014-10-06 MED ORDER — LORAZEPAM 0.5 MG PO TABS
ORAL_TABLET | ORAL | Status: DC
Start: 2014-10-06 — End: 2014-11-26

## 2014-10-06 NOTE — Telephone Encounter (Signed)
Rx called in as directed.   

## 2014-10-06 NOTE — Telephone Encounter (Signed)
plz phone in. 

## 2014-10-08 NOTE — Telephone Encounter (Signed)
i don't understand why this was denied. New letter provided, in chart, with more details. plz mail in. I've also placed forms wife brought to send in as well.

## 2014-10-09 ENCOUNTER — Encounter: Payer: Self-pay | Admitting: *Deleted

## 2014-10-09 NOTE — Telephone Encounter (Signed)
Letter mailed to [employer] Attn: Cleopatra Cedar Request [address] [place] [address] and patient's wife notified.

## 2014-10-13 ENCOUNTER — Telehealth: Payer: Self-pay

## 2014-10-13 NOTE — Telephone Encounter (Signed)
Cathy speech therapist with [facility] left v/m requesting verbal order to extend pts speech therapy 2 x a week for 2 weeks and 1 x a week for 2 weeks; pt is making progress.

## 2014-10-13 NOTE — Telephone Encounter (Signed)
Left message on machine that ok verbal order for speech therapy

## 2014-10-13 NOTE — Telephone Encounter (Signed)
Okay to continue?

## 2014-10-17 ENCOUNTER — Encounter: Payer: Self-pay | Admitting: Podiatry

## 2014-10-17 ENCOUNTER — Telehealth: Payer: Self-pay

## 2014-10-17 ENCOUNTER — Ambulatory Visit (INDEPENDENT_AMBULATORY_CARE_PROVIDER_SITE_OTHER): Payer: Medicare Other | Admitting: Podiatry

## 2014-10-17 ENCOUNTER — Ambulatory Visit: Payer: Medicare Other

## 2014-10-17 VITALS — BP 127/66 | HR 68 | Resp 16

## 2014-10-17 DIAGNOSIS — B351 Tinea unguium: Secondary | ICD-10-CM | POA: Diagnosis not present

## 2014-10-17 DIAGNOSIS — M79673 Pain in unspecified foot: Secondary | ICD-10-CM

## 2014-10-17 DIAGNOSIS — E119 Type 2 diabetes mellitus without complications: Secondary | ICD-10-CM | POA: Diagnosis not present

## 2014-10-17 NOTE — Telephone Encounter (Signed)
Jeffrey [name] OT with [facility] left v/m requesting verbal order to extend home health OT 2 x a week for 2 weeks. Increase frequency of home health aide from once a week to twice a week with self care activities. Jeffrey [name] also request printed rx for platform attachment for walker faxed to [facility] [place] fax # [phone].Please advise.

## 2014-10-17 NOTE — Progress Notes (Signed)
   Subjective:    Patient ID: Jeffrey [name], male    DOB: 11-17-1940, 74 y.o.   MRN: [id number]  HPI Comments: "I need them just looked over"  Patient presents with wife and daughter stating that he needs feet checked. Patient had a stroke in March and is a diabetic. Concerned about ingrown 1st toe left and the discoloring of the nails.   Diabetes x 20 years. Last A1c was 7.9  Diabetes Hypoglycemia symptoms include confusion. Associated symptoms include weakness.      Review of Systems  Constitutional: Positive for unexpected weight change.  Eyes: Positive for visual disturbance.  Cardiovascular: Positive for leg swelling.  Musculoskeletal: Positive for gait problem.  Neurological: Positive for weakness and numbness.  Psychiatric/Behavioral: Positive for confusion.  All other systems reviewed and are negative.      Objective:   Physical Exam        Assessment & Plan:

## 2014-10-17 NOTE — Telephone Encounter (Signed)
Rx written and in Kim's box. Ok to extend home health OT.

## 2014-10-18 ENCOUNTER — Other Ambulatory Visit: Payer: Self-pay | Admitting: Internal Medicine

## 2014-10-19 NOTE — Progress Notes (Signed)
Subjective:     Patient ID: Jeffrey [name], male   DOB: 1940/03/10, 74 y.o.   MRN: [id number]  HPI patient states I have nails that I cannot cut and they become painful 1 through 5 of both feet with thickness and incurvation of the corners and also I just wanted to make sure my diabetes was doing okay   Review of Systems  All other systems reviewed and are negative.      Objective:   Physical Exam  Constitutional: He is oriented to person, place, and time.  Cardiovascular: Intact distal pulses.   Musculoskeletal: Normal range of motion.  Neurological: He is oriented to person, place, and time.  Skin: Skin is warm and dry.  Nursing note and vitals reviewed.  vascular status was found to be intact with mild edema in the feet and mild diminishment of neurological sharp dull vibratory. Patient's found have nail disease 1-5 of both feet that are thick and yellow and brittle with discomfort in the corners and tightness of the skin around the areas but no drainage currently     Assessment:     Diabetic who is on insulin who is doing well with mild neuropathic changes and painful mycotic nail infections 1 through 5 both feet    Plan:     H&P and conditions reviewed and diabetic education rendered. Today I debrided nailbeds 1-5 both feet carefully taking out corners of nails and smooth nailbeds and explained future care. No iatrogenic bleeding was noted

## 2014-10-20 NOTE — Telephone Encounter (Signed)
[place] notified. I didn't have an Rx in my box for the attachment.

## 2014-10-20 NOTE — Telephone Encounter (Signed)
Given to [place].

## 2014-10-22 NOTE — Telephone Encounter (Signed)
Order faxed to [facility] as directed.

## 2014-10-31 ENCOUNTER — Telehealth: Payer: Self-pay

## 2014-10-31 NOTE — Telephone Encounter (Signed)
Connie notified

## 2014-10-31 NOTE — Telephone Encounter (Signed)
May give verbal ok for this.

## 2014-10-31 NOTE — Telephone Encounter (Signed)
Marlowe Kays OT with [facility] left v/m requesting verbal orders to extend home health OT 2 x a week for 8 weeks and home health order for home health aid for 2 x a week for 4 weeks.

## 2014-11-04 ENCOUNTER — Telehealth: Payer: Self-pay | Admitting: Family Medicine

## 2014-11-04 ENCOUNTER — Encounter: Payer: Self-pay | Admitting: Family Medicine

## 2014-11-04 NOTE — Telephone Encounter (Signed)
Pt's daughter, [name], dropped off ppw from the [place] that needs to be completed by Dr. Danise Mina. She sadi the best number to call when it is ready for pick up is her cell [phone]. Placing ppw on Kim's desk.

## 2014-11-04 NOTE — Telephone Encounter (Signed)
Filled and in Kim's box. 

## 2014-11-04 NOTE — Telephone Encounter (Signed)
In your IN box for completion.  

## 2014-11-05 DIAGNOSIS — Z7689 Persons encountering health services in other specified circumstances: Secondary | ICD-10-CM

## 2014-11-05 NOTE — Telephone Encounter (Signed)
Message left notifying patient's daughter and form placed up front for pick up.

## 2014-11-20 DIAGNOSIS — I2510 Atherosclerotic heart disease of native coronary artery without angina pectoris: Secondary | ICD-10-CM | POA: Diagnosis not present

## 2014-11-20 DIAGNOSIS — E1142 Type 2 diabetes mellitus with diabetic polyneuropathy: Secondary | ICD-10-CM | POA: Diagnosis not present

## 2014-11-20 DIAGNOSIS — M1990 Unspecified osteoarthritis, unspecified site: Secondary | ICD-10-CM

## 2014-11-20 DIAGNOSIS — I69351 Hemiplegia and hemiparesis following cerebral infarction affecting right dominant side: Secondary | ICD-10-CM | POA: Diagnosis not present

## 2014-11-20 DIAGNOSIS — I69322 Dysarthria following cerebral infarction: Secondary | ICD-10-CM | POA: Diagnosis not present

## 2014-11-24 ENCOUNTER — Encounter: Payer: Self-pay | Admitting: Cardiology

## 2014-11-24 DIAGNOSIS — I6389 Other cerebral infarction: Secondary | ICD-10-CM | POA: Insufficient documentation

## 2014-11-24 HISTORY — DX: Other cerebral infarction: I63.89

## 2014-11-26 ENCOUNTER — Encounter: Payer: Self-pay | Admitting: Cardiology

## 2014-11-26 ENCOUNTER — Ambulatory Visit (INDEPENDENT_AMBULATORY_CARE_PROVIDER_SITE_OTHER): Payer: Medicare Other | Admitting: Cardiology

## 2014-11-26 VITALS — BP 140/70 | HR 64 | Ht 68.0 in | Wt 160.0 lb

## 2014-11-26 DIAGNOSIS — I2510 Atherosclerotic heart disease of native coronary artery without angina pectoris: Secondary | ICD-10-CM

## 2014-11-26 DIAGNOSIS — I69851 Hemiplegia and hemiparesis following other cerebrovascular disease affecting right dominant side: Secondary | ICD-10-CM

## 2014-11-26 DIAGNOSIS — I639 Cerebral infarction, unspecified: Secondary | ICD-10-CM

## 2014-11-26 DIAGNOSIS — I69351 Hemiplegia and hemiparesis following cerebral infarction affecting right dominant side: Secondary | ICD-10-CM

## 2014-11-26 NOTE — Progress Notes (Signed)
Cardiology Office Note   Date:  11/26/2014   ID:  Jeffrey [name], DOB [birth year], MRN [id number]  PCP:  [name], MD  Cardiologist:  Dola Argyle, MD   Chief Complaint  Patient presents with  . Appointment    Follow-up coronary disease      History of Present Illness: Jeffrey [name] is a 74 y.o. male who presents today to follow-up coronary disease. I have followed him for many years. He has had PCI in the past. Most recently his coronary disease has been stable. There is good left ventricular function. The patient had a significant CVA in March, 2016. It is my understanding that he had a CVA with hemorrhagic transformation. He had significant right hemiparesis. He was discharged to rehabilitation. Later he returned with an episode of aphasia. This improved. The scans did not show any new changes. Ultimately he has been on Plavix. I have spent an extensive amount of time reviewing his hospital records. In one place it is thought that his CVA was due to small vessel disease. There is one, and about the possibility that it was an embolus. There is no history of atrial fibrillation. Atrial fibrillation was not seen in the hospital. The patient is a veteran. The initial plans had been for follow-up of his neurologic status at the [place] in [place]. However he and his wife are not able to travel frequently to [facility]. He is here today for cardiology follow up with me. He is also asking that I help arrange his neurology follow-up. In the records from the hospital I see that he was seen by Dr. Erlinda Hong. I will arrange for a visit with him. We have arranged this for October 6.  The patient has continued right hemiparesis. However he has had significant improvement. He continues to work hard at his rehabilitation. He had some problems with depression but is doing better.    Past Medical History  Diagnosis Date  . CAD (coronary artery disease)     PCI distal RCA ...2004, residual 70% LAD   /    ...nuclear...03/2007...no ischemia.Marland KitchenMarland Kitchenpreserved LV /  nuclear...03/03/2009...inferior scar..no ischemia..EF 51%  . Dyslipidemia     takes Atorvastatin daily  . Internal hemorrhoids   . Gout     takes Allopurinol daily and Colchicine as needed  . Seasonal allergic rhinitis   . Right carotid bruit   . Cyst of nasopharynx     per ENT Wilburn Cornelia  . Myocardial infarction 61yrs ago  . Peripheral edema     takes Lasix daily  . HTN (hypertension)     takes Cardura,Metoprolol,[name],and Amlodipine daily  . Arthritis   . GERD (gastroesophageal reflux disease)     takes Omeprazole daily  . History of colon polyps   . Type 2 diabetes, uncontrolled, with neuropathy 1989    takes Invokana daily and has an insulin pump (Dr. [name])  . 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 HCAP (healthcare-associated pneumonia)   . Stroke     right sided deficits    Past Surgical History  Procedure Laterality Date  . Cataract extraction Bilateral 2010  . Elbow surgery Right 1997  . Balloon angioplasty, artery  1992, 2004    CAD, Dr. Ron Parker  . Colonoscopy  04/03/2002    adenomatous polyp, int hemorrhoids  . Colonoscopy  07/18/2007    normal (Dr. Fuller Plan)  . Rotator cuff repair  09/2011    left, with subacromial decompression  .  Nasal septum surgery    . Colonoscopy  09/26/2012    tubular adenoma, sm int hem, rpt 5 yrs Fuller Plan)  . Tonsillectomy and adenoidectomy    . Eye lids raised    . Cardiac catheterization  2004  . Polypectomy N/A 05/27/2013    Procedure: ENDOSCOPIC NASOPHARYNGEAL MASS;  Surgeon: [name], MD    Patient Active Problem List   Diagnosis Date Noted  . Stroke 11/24/2014  . Hemiparesis affecting right side as late effect of cerebrovascular accident 09/05/2014  . Depression due to stroke 09/05/2014  . Thrombocytopenia 06/05/2014  . Chronic diastolic heart failure, NYHA class 1 06/05/2014  . Intracerebral hemorrhage 06/04/2014  .  Ex-smoker 06/03/2014  . Obesity 06/03/2014  . Benign paroxysmal positional vertigo 04/28/2014  . Right carotid bruit   . Polyneuropathy, diabetic 08/05/2012  . Seasonal allergic rhinitis   . Trigger thumb of left hand 10/12/2011  . Gout   . Adenomatous polyps   . CAD (coronary artery disease)   . Essential hypertension   . Hyperlipidemia   . Type 2 diabetes, uncontrolled, with neuropathy   . Ejection fraction       Current Outpatient Prescriptions  Medication Sig Dispense Refill  . atorvastatin (LIPITOR) 80 MG tablet Take 80 mg by mouth at bedtime.    . baclofen (LIORESAL) 10 MG tablet Take 5 mg by mouth 3 (three) times daily as needed for muscle spasms (TAKES AT BREAKFAST, CAN TAKE TWO MORE TIMES AS NEEDED). Daily in am, may have additional dose if needed for muscle spasm    . Cholecalciferol 50000 UNITS TABS Take 5,000 Units by mouth once a week.    . clopidogrel (PLAVIX) 75 MG tablet Take 1 tablet (75 mg total) by mouth daily. 30 tablet 6  . fosinopril ([name]) 40 MG tablet Take 40 mg by mouth daily.    . furosemide (LASIX) 40 MG tablet Take 40 mg by mouth daily.    . insulin glargine (LANTUS) 100 UNIT/ML injection Inject 18 Units into the skin at bedtime.     . insulin lispro (HUMALOG) 100 UNIT/ML injection Inject 6 Units into the skin 3 (three) times daily before meals.     [name] LACTOBACILLUS PO Take 1 capsule by mouth 3 (three) times daily. philips colon health    . loratadine (CLARITIN) 10 MG tablet Take 10 mg by mouth daily.    . metoprolol tartrate (LOPRESSOR) 25 MG tablet Take 25 mg by mouth 2 (two) times daily. Hold if B/P < 100/60 or HR< 60    . senna (SENOKOT) 8.6 MG tablet Take 1 tablet by mouth at bedtime.    . sertraline (ZOLOFT) 25 MG tablet Take 1 tablet (25 mg total) by mouth daily. 30 tablet 3  . tamsulosin (FLOMAX) 0.4 MG CAPS capsule Take 1 capsule (0.4 mg total) by mouth daily. (Patient taking differently: Take 0.4 mg by mouth daily after supper. ) 30 capsule     No current facility-administered medications for this visit.    Allergies:   Niacin    Social History:  The patient  reports that he has quit smoking. He has never used smokeless tobacco. He reports that he does not drink alcohol or use illicit drugs.   Family History:  The patient's family history includes Alcohol abuse in his father; Heart attack in his father and son. There is no history of Coronary artery disease, Stroke, Cancer, Diabetes, or Colon cancer.    ROS:  Please see the history of present illness.  Patient denies fever, chills, headache, sweats, rash, change in vision, change in hearing, chest pain, cough, nausea or vomiting, urinary symptoms. All other systems are reviewed and are negative.   PHYSICAL EXAM: VS:  BP 140/70 mmHg  Pulse 64  Ht 5\' 8"  (1.727 m)  Wt 160 lb (72.576 kg)  BMI 24.33 kg/m2  SpO2 98% , Patient is oriented to person time and place. Affect is normal. He is here with his wife and his daughter. Head is atraumatic. Sclera and conjunctiva are normal. There is no jugular venous distention. Lungs are clear. Respiratory effort is nonlabored. Cardiac exam reveals S1 and S2. The abdomen is soft. There is no peripheral edema. The patient has significant weakness with decreased motion of both his right arm and his right leg. He does have some motion. There are no skin rashes.   EKG:   I reviewed his EKG from the hospital from July, 2016. There was normal sinus rhythm. There was no significant change in the QRS.   Recent Labs: 06/05/2014: B Natriuretic Peptide 86.1 06/10/2014: Magnesium 2.0 09/05/2014: TSH 0.66 09/13/2014: ALT 26; BUN 11; Creatinine, Ser 0.93; Hemoglobin 15.2; Platelets 144*; Potassium 3.4*; Sodium 138    Lipid Panel    Component Value Date/Time   CHOL 145 09/14/2014 0550   CHOL 211 02/01/2011   TRIG 216* 09/14/2014 0550   TRIG 342 09/30/2013   TRIG 342 09/30/2013   TRIG 522 02/01/2011   TRIG 522 02/01/2011   HDL 29* 09/14/2014 0550    CHOLHDL 5.0 09/14/2014 0550   VLDL 43* 09/14/2014 0550   LDLCALC 73 09/14/2014 0550   LDLCALC 98 09/30/2013   LDLCALC 98 09/30/2013   LDLDIRECT 89.6 02/07/2012 1457      Wt Readings from Last 3 Encounters:  11/26/14 160 lb (72.576 kg)  09/18/14 159 lb 8 oz (72.349 kg)  09/13/14 159 lb 14.4 oz (72.53 kg)      Current medicines are reviewed       ASSESSMENT AND PLAN:

## 2014-11-26 NOTE — Assessment & Plan Note (Addendum)
Coronary disease is stable. We know that his ejection fraction remained normal by echo in the hospital in March, 2016. He has not had symptoms over time. He had had an echo earlier this year with normal LV function. Therefore he has not had exercise testing for many years. This is not needed at this time.

## 2014-11-26 NOTE — Patient Instructions (Signed)
Medication Instructions:  Same-no changes  Labwork: None  Testing/Procedures: None  Follow-Up: Your physician recommends that you schedule a follow-up appointment in: 5 to 6 weeks with Dr Marlou Porch.   Any Other Special Instructions Will Be Listed Below (If Applicable).  You are scheduled to see Dr Erlinda Hong on 12/11/14 at 3:30.

## 2014-11-26 NOTE — Assessment & Plan Note (Signed)
The patient had a CVA in March, 2016. There was a dominant left thalamic and left internal capsule ischemic infarct with resultant hemorrhagic transformation. In one report it is said to be secondary to small vessel disease. In another place there is mention the possibility of an embolus. His echo did not show any significant abnormalities. He is currently being treated with Plavix. I have arranged for him to see Dr. Erlinda Hong for outpatient neurology evaluation on October 6. Further cardiac evaluation will depend on Dr. Phoebe Sharps assessment. If it is felt that the patient had an embolic event leading to his stroke, I suspect he'll will be need for further assessment of the patient's rhythm to rule out atrial fibrillation. Up to this point atrial fibrillation has not been seen. The patient might require a LINQ implantable loop recorder for long-term assessment of his rhythm. I suspect that if atrial fibrillation were found, anticoagulation would be recommended. However, I do not know if anticoagulation is contraindicated with the patient's history of transformation of his CVA to a hemorrhagic CVA.  If Dr. Erlinda Hong feels that we do not need to rule out the possibility of an embolus, further cardiac monitoring will not be needed.  The patient and his family are aware that I am retiring on December 05, 2014. We have already made an appointment for the patient to see Dr. Marlou Porch of the cardiology team a few weeks after the patient is seen by neurology. Dr. Marlou Porch can then follow-up the recommendations from Dr. Erlinda Hong.  As part of today's evaluation I spent greater than 25 minutes with the patient's total care. More than half of this time has been with direct contact and counseling of the patient and his family. I've actually spent well over one hour with his care today.

## 2014-11-26 NOTE — Assessment & Plan Note (Signed)
The patient is working hard with his rehabilitation. He has had some improvement over time.

## 2014-12-11 ENCOUNTER — Ambulatory Visit: Payer: Medicare Other | Admitting: Neurology

## 2014-12-17 ENCOUNTER — Ambulatory Visit (INDEPENDENT_AMBULATORY_CARE_PROVIDER_SITE_OTHER): Payer: Medicare Other | Admitting: Neurology

## 2014-12-17 ENCOUNTER — Encounter: Payer: Self-pay | Admitting: Neurology

## 2014-12-17 VITALS — BP 127/74 | HR 65 | Ht 68.0 in | Wt 162.0 lb

## 2014-12-17 DIAGNOSIS — I6389 Other cerebral infarction: Secondary | ICD-10-CM

## 2014-12-17 DIAGNOSIS — I638 Other cerebral infarction: Secondary | ICD-10-CM

## 2014-12-17 NOTE — Progress Notes (Signed)
[facility] [address] [place]. [place] [address] [phone]       OFFICE FOLLOW-UP NOTE  Jeffrey. Jeffrey [name] Date of Birth:  11-May-1940 Medical Record Number:  [id number]   HPI: Jeffrey [name] is a 83 year Caucasian male seen today for first office follow-up visit following hospital admission for stroke in March 2016.Jeffrey [name] is an 74 y.o. male with a history of hypertension, hyperlipidemia, coronary artery disease and diabetes mellitus, brought to the emergency room after being found lying in his 38 at home. He was noted to have right-sided weakness and speech. He also appeared to be somewhat confused. Code stroke was activated. It's not clear exactly when he was last known well, however. CT scan of his head showed an acute left thalamic and internal capsular ridge with intraventricular extension. Code stroke was subsequently canceled. NIH stroke score was 11. His last seen normal was unknown. He was not a PTA candidate given ICH, IVH. He was admitted to the intensive care unit where he had close neurological monitoring and blood pressure was tightly controlled. Follow-up imaging studies showed stable appearance of the hematoma and intraventricular hemorrhage hemorrhage. He did not need neurosurgical intervention. MRI scan of the brain showed a left, thalamic hemorrhage which probably represented hemorrhagic transformation of left thalamic and posterior corona radiata infarct. Intracranial MRA showed no large vessel stenosis or occlusion. Transthoracic echocardiogram showed normal ejection fraction. He had significant right hemiplegia sensory loss and ataxia. His transfer to inpatient rehabilitation where he was there for several weeks. He has subsequently finished rehabilitation stay and is currently at home. He is getting home occupational therapy yet but has been discharged from physical therapy. Patient is able to ambulate with great assistance and he needs a platform  walker with one person assist. He does not walk a lot because his wife is quite fragile and is scheduled to work with him. He still has significant weakness in his right hand the right leg strength is improved. His right hemisensory loss persists. He was readmitted in July with speech difficulties and was felt to be dehydrated. He was on aspirin at that time which was switched to Plavix which is tolerating well without bleeding or bruising. His blood pressure stays well controlled today does 127/64 in office. His fasting sugars are usually in the 100 range. He had hemobilia A1c and lipid profile checked last week at the [facility] for the results are not yet known.  ROS:   14 system review of systems is positive for  weight loss, hearing loss, trouble swallowing, double vision , gait difficulty, incoordination, sensory loss and all other systems negative PMH:  Past Medical History  Diagnosis Date  . CAD (coronary artery disease)     PCI distal RCA ...2004, residual 70% LAD   /   ...nuclear...03/2007...no ischemia.Marland KitchenMarland Kitchenpreserved LV /  nuclear...03/03/2009...inferior scar..no ischemia..EF 51%  . Dyslipidemia     takes Atorvastatin daily  . Internal hemorrhoids   . Gout     takes Allopurinol daily and Colchicine as needed  . Seasonal allergic rhinitis   . Right carotid bruit   . Cyst of nasopharynx     per ENT Wilburn Cornelia  . Myocardial infarction [facility]) 49yrs ago  . Peripheral edema     takes Lasix daily  . HTN (hypertension)     takes Cardura,Metoprolol,[name],and Amlodipine daily  . Arthritis   . GERD (gastroesophageal reflux disease)     takes Omeprazole daily  . History of colon  polyps   . Type 2 diabetes, uncontrolled, with neuropathy ([place]) 1989    takes Invokana daily and has an insulin pump (Dr. [name])  . 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 HCAP (healthcare-associated pneumonia)   . Stroke [facility])     right sided deficits     Social History:  Social History   Social History  . Marital Status: Married    Spouse Name: N/A  . Number of Children: 2  . Years of Education: N/A   Occupational History  . [employer]    Social History Main Topics  . Smoking status: Former Research scientist (life sciences)  . Smokeless tobacco: Never Used     Comment: quit smoking 45 years ago.  . Alcohol Use: No  . Drug Use: No  . Sexual Activity: Yes   Other Topics Concern  . Not on file   Social History Narrative   Caffeine: 6-7 cups coffee   Lives with wife, no pets   2 grown children, 4 grandchildren   Occu: Artist   Edu: [employer]   Activity: not much   Diet: plenty of water, fruits and vegetables   Some care through [place]      Endo: Dr. Buddy Duty    Medications:   Current Outpatient Prescriptions on File Prior to Visit  Medication Sig Dispense Refill  . atorvastatin (LIPITOR) 80 MG tablet Take 80 mg by mouth at bedtime.    . baclofen (LIORESAL) 10 MG tablet Take 5 mg by mouth 3 (three) times daily as needed for muscle spasms (TAKES AT BREAKFAST, CAN TAKE TWO MORE TIMES AS NEEDED). Daily in am, may have additional dose if needed for muscle spasm    . Cholecalciferol 50000 UNITS TABS Take 5,000 Units by mouth once a week.    . clopidogrel (PLAVIX) 75 MG tablet Take 1 tablet (75 mg total) by mouth daily. 30 tablet 6  . fosinopril ([name]) 40 MG tablet Take 40 mg by mouth daily.    . furosemide (LASIX) 40 MG tablet Take 40 mg by mouth daily.    . insulin glargine (LANTUS) 100 UNIT/ML injection Inject 18 Units into the skin at bedtime.     . insulin lispro (HUMALOG) 100 UNIT/ML injection Inject 6 Units into the skin 3 (three) times daily before meals.     [name] LACTOBACILLUS PO Take 1 capsule by mouth 3 (three) times daily. philips colon [facility]    . loratadine (CLARITIN) 10 MG tablet Take 10 mg by mouth daily.    . metoprolol tartrate (LOPRESSOR) 25 MG tablet Take 25 mg by mouth 2 (two) times daily. Hold if B/P < 100/60  or HR< 60    . senna (SENOKOT) 8.6 MG tablet Take 1 tablet by mouth at bedtime.    . sertraline (ZOLOFT) 25 MG tablet Take 1 tablet (25 mg total) by mouth daily. 30 tablet 3  . tamsulosin (FLOMAX) 0.4 MG CAPS capsule Take 1 capsule (0.4 mg total) by mouth daily. (Patient taking differently: Take 0.4 mg by mouth daily after supper. ) 30 capsule    No current facility-administered medications on file prior to visit.    Allergies:   Allergies  Allergen Reactions  . Niacin Other (See Comments)    REACTION: intolerant,not allergic="flushing,hot flashes,turning red"    Physical Exam General: well developed, well nourished, seated, in no evident distress Head: head normocephalic and atraumatic.  Neck: supple with no carotid or supraclavicular bruits Cardiovascular:  regular rate and rhythm, no murmurs Musculoskeletal: no deformity Skin:  no rash/petichiae Vascular:  Normal pulses all extremities Filed Vitals:   12/17/14 1502  BP: 127/74  Pulse: 65   Neurologic Exam Mental Status: Awake and fully alert. Oriented to place and time. Recent and remote memory intact. Attention span, concentration and fund of knowledge appropriate. Mood and affect appropriate.  Cranial Nerves: Fundoscopic exam reveals sharp disc margins. Pupils equal, briskly reactive to light. Extraocular movements full without nystagmus. Visual fields full to confrontation. Hearing intact. Facial sensation intact. Right lower face weakness.Face, tongue, palate moves normally and symmetrically.  Motor: Right hemiparesis with right upper extremity 3/5 proximally and 2/5 in the right grip and intrinsic hand muscles. 4/5 strength in right lower extremity..  Sensory.: intact to touch ,pinprick .position and vibratory sensation.  Coordination: Decreased right hemibody touch and pinprick sensation. Gait and Station: Deferred as patient is one person assist and needs a gait belt Reflexes: 2+ and symmetric and brisker on right. Toes  downgoing.   NIHSS 7 Modified Rankin  4   ASSESSMENT: 46 year Caucasian male with left thalamic hemorrhagic infarct in March 2016 with residual right hemiparesis,   ataxia and sensory loss. Vascular risk factors of hypertension, diabetes, hyperlipidemia and coronary artery disease.    PLAN: I had a long d/w patient, wife and daughter about his recent stroke, risk for recurrent stroke/TIAs, personally independently reviewed imaging studies and stroke evaluation results and answered questions.Continue clopidogrel 75 mg orally every day  for secondary stroke prevention and maintain strict control of hypertension with blood pressure goal below 130/90, diabetes with hemoglobin A1c goal below 6.5% and lipids with LDL cholesterol goal below 100 mg/dL. I also advised the patient to eat a healthy diet with plenty of whole grains, cereals, fruits and vegetables, exercise regularly and maintain ideal body weight .I recommend outpatient physical and occupational therapy to improve gait balance and activities of daily living.Greater than 50% of time during this 25 minute visit was spent on counseling,explanation of diagnosis, planning of further management, discussion with patient and family and coordination of care  Followup in the future with me in  6 months or call earlier if necessary  Antony Contras, MD Note: This document was prepared with digital dictation and possible smart phrase technology. Any transcriptional errors that result from this process are unintentional

## 2014-12-17 NOTE — Patient Instructions (Signed)
I had a long d/w patient, wife and daughter about his recent stroke, risk for recurrent stroke/TIAs, personally independently reviewed imaging studies and stroke evaluation results and answered questions.Continue clopidogrel 75 mg orally every day  for secondary stroke prevention and maintain strict control of hypertension with blood pressure goal below 130/90, diabetes with hemoglobin A1c goal below 6.5% and lipids with LDL cholesterol goal below 100 mg/dL. I also advised the patient to eat a healthy diet with plenty of whole grains, cereals, fruits and vegetables, exercise regularly and maintain ideal body weight .I recommend outpatient physical and occupational therapy to improve gait balance and activities of daily living. Followup in the future with me in  6 months or call earlier if necessary Stroke Prevention Some medical conditions and behaviors are associated with an increased chance of having a stroke. You may prevent a stroke by making healthy choices and managing medical conditions. HOW CAN I REDUCE MY RISK OF HAVING A STROKE?   Stay physically active. Get at least 30 minutes of activity on most or all days.  Do not smoke. It may also be helpful to avoid exposure to secondhand smoke.  Limit alcohol use. Moderate alcohol use is considered to be:  No more than 2 drinks per day for men.  No more than 1 drink per day for nonpregnant women.  Eat healthy foods. This involves:  Eating 5 or more servings of fruits and vegetables a day.  Making dietary changes that address high blood pressure (hypertension), high cholesterol, diabetes, or obesity.  Manage your cholesterol levels.  Making food choices that are high in fiber and low in saturated fat, trans fat, and cholesterol may control cholesterol levels.  Take any prescribed medicines to control cholesterol as directed by your health care provider.  Manage your diabetes.  Controlling your carbohydrate and sugar intake is recommended  to manage diabetes.  Take any prescribed medicines to control diabetes as directed by your health care provider.  Control your hypertension.  Making food choices that are low in salt (sodium), saturated fat, trans fat, and cholesterol is recommended to manage hypertension.  Ask your health care provider if you need treatment to lower your blood pressure. Take any prescribed medicines to control hypertension as directed by your health care provider.  If you are 45-103 years of age, have your blood pressure checked every 3-5 years. If you are 29 years of age or older, have your blood pressure checked every year.  Maintain a healthy weight.  Reducing calorie intake and making food choices that are low in sodium, saturated fat, trans fat, and cholesterol are recommended to manage weight.  Stop drug abuse.  Avoid taking birth control pills.  Talk to your health care provider about the risks of taking birth control pills if you are over 31 years old, smoke, get migraines, or have ever had a blood clot.  Get evaluated for sleep disorders (sleep apnea).  Talk to your health care provider about getting a sleep evaluation if you snore a lot or have excessive sleepiness.  Take medicines only as directed by your health care provider.  For some people, aspirin or blood thinners (anticoagulants) are helpful in reducing the risk of forming abnormal blood clots that can lead to stroke. If you have the irregular heart rhythm of atrial fibrillation, you should be on a blood thinner unless there is a good reason you cannot take them.  Understand all your medicine instructions.  Make sure that other conditions (such as anemia  or atherosclerosis) are addressed. SEEK IMMEDIATE MEDICAL CARE IF:   You have sudden weakness or numbness of the face, arm, or leg, especially on one side of the body.  Your face or eyelid droops to one side.  You have sudden confusion.  You have trouble speaking (aphasia)  or understanding.  You have sudden trouble seeing in one or both eyes.  You have sudden trouble walking.  You have dizziness.  You have a loss of balance or coordination.  You have a sudden, severe headache with no known cause.  You have new chest pain or an irregular heartbeat. Any of these symptoms may represent a serious problem that is an emergency. Do not wait to see if the symptoms will go away. Get medical help at once. Call your local emergency services (911 in [place] Do not drive yourself to the hospital.   This information is not intended to replace advice given to you by your health care provider. Make sure you discuss any questions you have with your health care provider.   Document Released: 03/31/2004 Document Revised: 03/14/2014 Document Reviewed: 08/24/2012 Elsevier Interactive Patient Education [employer].

## 2014-12-18 ENCOUNTER — Ambulatory Visit: Payer: Medicare Other | Admitting: Family Medicine

## 2014-12-18 ENCOUNTER — Ambulatory Visit (INDEPENDENT_AMBULATORY_CARE_PROVIDER_SITE_OTHER): Payer: Medicare Other | Admitting: Family Medicine

## 2014-12-18 ENCOUNTER — Encounter: Payer: Self-pay | Admitting: Family Medicine

## 2014-12-18 VITALS — BP 122/72 | HR 80 | Temp 97.9°F | Wt 161.0 lb

## 2014-12-18 DIAGNOSIS — I6389 Other cerebral infarction: Secondary | ICD-10-CM

## 2014-12-18 DIAGNOSIS — I69351 Hemiplegia and hemiparesis following cerebral infarction affecting right dominant side: Secondary | ICD-10-CM

## 2014-12-18 DIAGNOSIS — I638 Other cerebral infarction: Secondary | ICD-10-CM | POA: Diagnosis not present

## 2014-12-18 NOTE — Assessment & Plan Note (Signed)
Has graduated to outpatient PT program.

## 2014-12-18 NOTE — Assessment & Plan Note (Signed)
Saw neurology yesterday, planned f/u with cards next week to discuss possible further evaluation.

## 2014-12-18 NOTE — Patient Instructions (Signed)
Nice to see you today, you are doing well.  I'm glad we are getting you set up with outpatient physical therapy. Return in 2 months for medicare wellness visit.

## 2014-12-18 NOTE — Progress Notes (Signed)
Pre visit review using our clinic review tool, if applicable. No additional management support is needed unless otherwise documented below in the visit note. 

## 2014-12-18 NOTE — Progress Notes (Signed)
BP 122/72 mmHg  Pulse 80  Temp(Src) 97.9 F (36.6 C) (Oral)  Wt 161 lb (73.029 kg)   CC: 3 mo f/u visit  Subjective:    Patient ID: Jeffrey [name], male    DOB: 26-Feb-1941, 74 y.o.   MRN: [id number]  HPI: Jeffrey [name] is a 74 y.o. male presenting on 12/18/2014 for Follow-up   S/p L thalamic/IC hemorrahgic stroke 06/2014. Residual R sided hemiparesis.   Saw neurology yesterday - time to graduate to outpatient PT/OT. States neurology thought this was embolic stroke.  DM - followed by endo. Fasting sugar this morning 86.   Recent labs at [facility], pending results.   Unsure about vaccinations - consider pneumonia shots next visit.   Relevant past medical, surgical, family and social history reviewed and updated as indicated. Interim medical history since our last visit reviewed. Allergies and medications reviewed and updated. Current Outpatient Prescriptions on File Prior to Visit  Medication Sig  . atorvastatin (LIPITOR) 80 MG tablet Take 80 mg by mouth at bedtime.  . baclofen (LIORESAL) 10 MG tablet Take 5 mg by mouth 3 (three) times daily as needed for muscle spasms (TAKES AT BREAKFAST, CAN TAKE TWO MORE TIMES AS NEEDED). Daily in am, may have additional dose if needed for muscle spasm  . clopidogrel (PLAVIX) 75 MG tablet Take 1 tablet (75 mg total) by mouth daily.  . fosinopril ([name]) 40 MG tablet Take 40 mg by mouth daily.  . furosemide (LASIX) 40 MG tablet Take 40 mg by mouth daily.  . insulin glargine (LANTUS) 100 UNIT/ML injection Inject 18 Units into the skin at bedtime.   . insulin lispro (HUMALOG) 100 UNIT/ML injection Inject 6 Units into the skin 3 (three) times daily before meals.   [name] LACTOBACILLUS PO Take 1 capsule by mouth 3 (three) times daily. [facility] colon [facility]  . loratadine (CLARITIN) 10 MG tablet Take 10 mg by mouth daily.  . metoprolol tartrate (LOPRESSOR) 25 MG tablet Take 25 mg by mouth 2 (two) times daily. Hold if B/P < 100/60 or HR< 60  . senna  (SENOKOT) 8.6 MG tablet Take 1 tablet by mouth as directed. M,W,F  . sertraline (ZOLOFT) 25 MG tablet Take 1 tablet (25 mg total) by mouth daily.  . tamsulosin (FLOMAX) 0.4 MG CAPS capsule Take 1 capsule (0.4 mg total) by mouth daily.   No current facility-administered medications on file prior to visit.    Review of Systems Per HPI unless specifically indicated above     Objective:    BP 122/72 mmHg  Pulse 80  Temp(Src) 97.9 F (36.6 C) (Oral)  Wt 161 lb (73.029 kg)  Wt Readings from Last 3 Encounters:  12/18/14 161 lb (73.029 kg)  12/17/14 162 lb (73.483 kg)  11/26/14 160 lb (72.576 kg)    Physical Exam  Constitutional: He appears well-developed and well-nourished. No distress.  In wheelchair  HENT:  Mouth/Throat: Oropharynx is clear and moist. No oropharyngeal exudate.  Cardiovascular: Normal rate, regular rhythm, normal heart sounds and intact distal pulses.   No murmur heard. Pulmonary/Chest: Effort normal and breath sounds normal. No respiratory distress. He has no wheezes. He has no rales.  Musculoskeletal: He exhibits no edema.  Neurological:  Residual R hemiparesis  Skin: Skin is warm and dry. No rash noted.  Nursing note and vitals reviewed.      Assessment & Plan:   Problem List Items Addressed This Visit    Hemiparesis affecting right side as late effect of  cerebrovascular accident [facility]) - Primary    Has graduated to outpatient PT program.       Anterior cerebral circulation hemorrhagic infarction [facility])    Saw neurology yesterday, planned f/u with cards next week to discuss possible further evaluation.          Follow up plan: Return in about 2 months (around 02/17/2015), or as needed, for medicare wellness visit.

## 2014-12-27 ENCOUNTER — Encounter: Payer: Self-pay | Admitting: Family Medicine

## 2014-12-29 ENCOUNTER — Ambulatory Visit (INDEPENDENT_AMBULATORY_CARE_PROVIDER_SITE_OTHER): Payer: Medicare Other | Admitting: Podiatry

## 2014-12-29 ENCOUNTER — Encounter: Payer: Self-pay | Admitting: Cardiology

## 2014-12-29 ENCOUNTER — Ambulatory Visit (INDEPENDENT_AMBULATORY_CARE_PROVIDER_SITE_OTHER): Payer: Medicare Other | Admitting: Cardiology

## 2014-12-29 ENCOUNTER — Encounter: Payer: Self-pay | Admitting: Podiatry

## 2014-12-29 VITALS — BP 148/84 | HR 61 | Ht 68.0 in | Wt 162.8 lb

## 2014-12-29 VITALS — BP 159/68 | HR 56 | Resp 12

## 2014-12-29 DIAGNOSIS — I10 Essential (primary) hypertension: Secondary | ICD-10-CM | POA: Diagnosis not present

## 2014-12-29 DIAGNOSIS — I638 Other cerebral infarction: Secondary | ICD-10-CM | POA: Diagnosis not present

## 2014-12-29 DIAGNOSIS — I6389 Other cerebral infarction: Secondary | ICD-10-CM

## 2014-12-29 DIAGNOSIS — L03032 Cellulitis of left toe: Secondary | ICD-10-CM | POA: Diagnosis not present

## 2014-12-29 DIAGNOSIS — I2510 Atherosclerotic heart disease of native coronary artery without angina pectoris: Secondary | ICD-10-CM | POA: Diagnosis not present

## 2014-12-29 DIAGNOSIS — I2583 Coronary atherosclerosis due to lipid rich plaque: Secondary | ICD-10-CM

## 2014-12-29 DIAGNOSIS — L03012 Cellulitis of left finger: Secondary | ICD-10-CM

## 2014-12-29 NOTE — Patient Instructions (Signed)
Medication Instructions:  The current medical regimen is effective;  continue present plan and medications.  Follow-Up: Follow up in 6 months with Dr. Skains.  You will receive a letter in the mail 2 months before you are due.  Please call us when you receive this letter to schedule your follow up appointment.  Thank you for choosing [facility]!!     

## 2014-12-29 NOTE — Progress Notes (Signed)
Cardiology Office Note   Date:  12/29/2014   ID:  Jeffrey [name], DOB Aug 18, 1940, MRN [id number]  PCP:  Jeffrey Bush, MD  Cardiologist:   Candee Furbish, MD       History of Present Illness: Jeffrey [name] is a 74 y.o. male prior patient of Dr. Ron Parker here to establish care who followed him for many years who has coronary artery disease status post PCI with normal left ventricular function. Unfortunately in March 2016 he had a stroke, hemorrhagic transformation, significant right hemiparesis come a rehabilitation and later returned with an episode of A. fib which improved. He had been on Plavix. No history of atrial fibrillation. He is a English as a second language teacher. Neurology did not feel the need for implantable loop recorder. He would be at increased risk for anticoagulation given hemorrhagic transformation.  Here with his family. He is retired [employer]. He is having no chest pain, no syncope, no shortness of breath, no anginal symptoms.  Today he went to the foot doctor, toe nail was ingrown.    Past Medical History  Diagnosis Date  . CAD (coronary artery disease)     PCI distal RCA ...2004, residual 70% LAD   /   ...nuclear...03/2007...no ischemia.Marland KitchenMarland Kitchenpreserved LV /  nuclear...03/03/2009...inferior scar..no ischemia..EF 51%  . Dyslipidemia     takes Atorvastatin daily  . Internal hemorrhoids   . Gout     takes Allopurinol daily and Colchicine as needed  . Seasonal allergic rhinitis   . Right carotid bruit   . Cyst of nasopharynx     per ENT Wilburn Cornelia  . Myocardial infarction [facility]) 44yrs ago  . Peripheral edema     takes Lasix daily  . HTN (hypertension)     takes Cardura,Metoprolol,[name],and Amlodipine daily  . Arthritis   . GERD (gastroesophageal reflux disease)     takes Omeprazole daily  . History of colon polyps   . Type 2 diabetes, uncontrolled, with neuropathy ([place]) 1989    takes Invokana daily and has an insulin pump (Dr. [name])  . Pharyngeal or nasopharyngeal  cyst 05/2013    with chronic hoarseness s/p excision  . HCAP (healthcare-associated pneumonia)   . Hemiparesis affecting right side as late effect of cerebrovascular accident ([place]) 09/05/2014  . Anterior cerebral circulation hemorrhagic infarction [facility]) 11/24/2014    March, 2016, dominant left thalamic and left internal capsule ischemic infarct with resultant hemorrhagic transformation, secondary to small vessel disease. Resulted in right hemiparesis dysarthria and diplopia   //   readmission with aphasia July, 2016, this improved     Past Surgical History  Procedure Laterality Date  . Cataract extraction Bilateral 2010  . Elbow surgery Right 1997  . Balloon angioplasty, artery  1992, 2004    CAD, Dr. Ron Parker  . Colonoscopy  04/03/2002    adenomatous polyp, int hemorrhoids  . Colonoscopy  07/18/2007    normal (Dr. Fuller Plan)  . Rotator cuff repair  09/2011    left, with subacromial decompression  . Nasal septum surgery    . Colonoscopy  09/26/2012    tubular adenoma, sm int hem, rpt 5 yrs Fuller Plan)  . Tonsillectomy and adenoidectomy    . Eye lids raised    . Cardiac catheterization  2004  . Polypectomy N/A 05/27/2013    Procedure: ENDOSCOPIC NASOPHARYNGEAL MASS;  Surgeon: Jeffrey Belfast, MD     Current Outpatient Prescriptions  Medication Sig Dispense Refill  . atorvastatin (LIPITOR) 80 MG tablet Take 80 mg by mouth at bedtime.    [name]  baclofen (LIORESAL) 10 MG tablet Take 5 mg by mouth 3 (three) times daily as needed for muscle spasms (TAKES AT BREAKFAST, CAN TAKE TWO MORE TIMES AS NEEDED). Daily in am, may have additional dose if needed for muscle spasm    . Cholecalciferol (VITAMIN D3) 5000 UNITS TABS Take 5,000 Units by mouth once a week.    . clopidogrel (PLAVIX) 75 MG tablet Take 1 tablet (75 mg total) by mouth daily. 30 tablet 6  . fosinopril ([name]) 40 MG tablet Take 40 mg by mouth daily.    . furosemide (LASIX) 40 MG tablet Take 40 mg by mouth daily.    . insulin glargine (LANTUS) 100  UNIT/ML injection Inject 18 Units into the skin at bedtime.     . insulin lispro (HUMALOG) 100 UNIT/ML injection Inject 6 Units into the skin 3 (three) times daily before meals.     [name] LACTOBACILLUS PO Take 1 capsule by mouth 3 (three) times daily. [facility]    . loratadine (CLARITIN) 10 MG tablet Take 10 mg by mouth daily.    . metoprolol tartrate (LOPRESSOR) 25 MG tablet Take 25 mg by mouth 2 (two) times daily. Hold if B/P < 100/60 or HR< 60    . senna (SENOKOT) 8.6 MG tablet Take 1 tablet by mouth as directed. M,W,F    . sertraline (ZOLOFT) 25 MG tablet Take 1 tablet (25 mg total) by mouth daily. 30 tablet 3  . tamsulosin (FLOMAX) 0.4 MG CAPS capsule Take 1 capsule (0.4 mg total) by mouth daily. 30 capsule    No current facility-administered medications for this visit.    Allergies:   Niacin    Social History:  The patient  reports that he has quit smoking. He has never used smokeless tobacco. He reports that he does not drink alcohol or use illicit drugs.   Family History:  The patient's family history includes Alcohol abuse in his father; Heart attack in his father and son. There is no history of Coronary artery disease, Stroke, Cancer, Diabetes, or Colon cancer.    ROS:  Please see the history of present illness.  No bleeding Otherwise, review of systems are positive for none.   All other systems are reviewed and negative.    PHYSICAL EXAM: VS:  BP 148/84 mmHg  Pulse 61  Ht 5\' 8"  (1.727 m)  Wt 162 lb 12.8 oz (73.846 kg)  BMI 24.76 kg/m2  SpO2 97% , BMI Body mass index is 24.76 kg/(m^2). GEN: Well nourished, well developed, in no acute distress HEENT: normal Neck: no JVD, carotid bruits, or masses Cardiac: RRR; no murmurs, rubs, or gallops,no edema  Respiratory:  clear to auscultation bilaterally, normal work of breathing GI: soft, nontender, nondistended, + BS MS: no deformity or atrophy Skin: warm and dry, no rash Neuro:  Right-sided weakness, wheelchair Psych:  euthymic mood, full affect   EKG: None today, previously monitoring in hospital showed no evidence of atrial fibrillation.   Recent Labs: 06/05/2014: B Natriuretic Peptide 86.1 06/10/2014: Magnesium 2.0 09/05/2014: TSH 0.66 09/13/2014: ALT 26; BUN 11; Creatinine, Ser 0.93; Hemoglobin 15.2; Platelets 144*; Potassium 3.4*; Sodium 138    Lipid Panel    Component Value Date/Time   CHOL 145 09/14/2014 0550   CHOL 211 02/01/2011   TRIG 216* 09/14/2014 0550   TRIG 342 09/30/2013   TRIG 342 09/30/2013   TRIG 522 02/01/2011   TRIG 522 02/01/2011   HDL 29* 09/14/2014 0550   CHOLHDL 5.0 09/14/2014 0550  VLDL 43* 09/14/2014 0550   LDLCALC 73 09/14/2014 0550   LDLCALC 98 09/30/2013   LDLCALC 98 09/30/2013   LDLDIRECT 89.6 02/07/2012 1457      Wt Readings from Last 3 Encounters:  12/29/14 162 lb 12.8 oz (73.846 kg)  12/18/14 161 lb (73.029 kg)  12/17/14 162 lb (73.483 kg)      Other studies Reviewed: Additional studies/ records that were reviewed today include: Hospital records reviewed, lab work reviewed Review of the above records demonstrates: As above   ASSESSMENT AND PLAN:  Coronary artery disease status post PCI of distal RCA 2004  - Stable, doing well, no anginal symptoms.  - 2010 nuclear stress test showed inferior scar, no ischemia  - Ejection fraction normal on echo in April 2016.  - Continuing with Plavix post stroke.  Stroke with hemorrhagic transformation, right-sided hemiparesis  - Plavix  - Reviewed note from Dr. Leonie Man  - Continue to optimize diabetes, hyperlipidemia, blood pressure targets.  Essential hypertension  - Mildly elevated today however normally under good range.  - Medications reviewed.  Hyperlipidemia  - LDL in the 60s. Excellent.  - Continue with high-dose atorvastatin.   Current medicines are reviewed at length with the patient today.  The patient does not have concerns regarding medicines.  The following changes have been made:  no  change  Labs/ tests ordered today include:  No orders of the defined types were placed in this encounter.     Disposition:   FU with [facility] in 6 months  Signed, Candee Furbish, MD  12/29/2014 2:03 PM    [facility] [place], [place], [place]  [address] Phone: [phone]; Fax: [phone]

## 2014-12-29 NOTE — Progress Notes (Signed)
   Subjective:    Patient ID: Jeffrey [name], male    DOB: April 17, 1940, 74 y.o.   MRN: [id number]  HPI  PT STATED LT FOOT MEDIAL SIDE OF THE GREAT TOENAIL HAVE REDNESS AND SORE TO TOUCH. THE TOENAIL IS LOOKING A LITTLE BETTER AND TRIED ANTIBIOTIC OINTMENT AND WRAP WITH THE BANDAGE-NO RELIEF.  Review of Systems  Musculoskeletal: Positive for gait problem.  Skin: Positive for color change.  All other systems reviewed and are negative.      Objective:   Physical Exam        Assessment & Plan:

## 2014-12-29 NOTE — Patient Instructions (Signed)

## 2014-12-30 NOTE — Progress Notes (Signed)
Subjective:     Patient ID: Jeffrey [name], male   DOB: 25-Oct-1940, 74 y.o.   MRN: [id number]  HPI patient states my left big toe is sore on the side and makes it hard to wear shoe gear comfortably. States it's been present for a while   Review of Systems     Objective:   Physical Exam Neurovascular status intact no other health history changes noted with inflammation and redness of the lateral border left hallux localized in nature    Assessment:     Paronychia infection left hallux lateral border with pain    Plan:     H&P condition reviewed and recommended removal of the corner to her family. They want this done and I infiltrated the left hallux 60 Milligan times like Marcaine mixture removed the lateral border removed proud flesh and allowed channel for drainage and reappoint to recheck

## 2014-12-31 ENCOUNTER — Telehealth: Payer: Self-pay | Admitting: *Deleted

## 2014-12-31 NOTE — Telephone Encounter (Signed)
Called patient at [phone] (Home #) to check to see how they were doing from their ingrown toenail procedure that was performed on Monday, December 29, 2014. Pt's wife stated, "husband is doing okay". Toe was hurting last night, but did soak toe with some relief.

## 2015-01-06 ENCOUNTER — Ambulatory Visit: Payer: Medicare Other

## 2015-01-06 ENCOUNTER — Encounter: Payer: Medicare Other | Admitting: Occupational Therapy

## 2015-01-07 ENCOUNTER — Ambulatory Visit: Payer: Medicare Other | Attending: Neurology | Admitting: Physical Therapy

## 2015-01-07 DIAGNOSIS — R2689 Other abnormalities of gait and mobility: Secondary | ICD-10-CM | POA: Insufficient documentation

## 2015-01-07 DIAGNOSIS — R279 Unspecified lack of coordination: Secondary | ICD-10-CM | POA: Diagnosis present

## 2015-01-07 DIAGNOSIS — M25611 Stiffness of right shoulder, not elsewhere classified: Secondary | ICD-10-CM | POA: Diagnosis present

## 2015-01-07 DIAGNOSIS — I69319 Unspecified symptoms and signs involving cognitive functions following cerebral infarction: Secondary | ICD-10-CM | POA: Diagnosis present

## 2015-01-07 DIAGNOSIS — G8191 Hemiplegia, unspecified affecting right dominant side: Secondary | ICD-10-CM | POA: Diagnosis present

## 2015-01-07 DIAGNOSIS — H539 Unspecified visual disturbance: Secondary | ICD-10-CM | POA: Insufficient documentation

## 2015-01-07 DIAGNOSIS — R2681 Unsteadiness on feet: Secondary | ICD-10-CM | POA: Insufficient documentation

## 2015-01-07 DIAGNOSIS — M6281 Muscle weakness (generalized): Secondary | ICD-10-CM | POA: Diagnosis present

## 2015-01-07 DIAGNOSIS — R269 Unspecified abnormalities of gait and mobility: Secondary | ICD-10-CM | POA: Diagnosis present

## 2015-01-07 DIAGNOSIS — I69351 Hemiplegia and hemiparesis following cerebral infarction affecting right dominant side: Secondary | ICD-10-CM

## 2015-01-08 NOTE — Therapy (Signed)
[facility] [address] [place], [place], [address] Phone: [phone]   Fax:  [phone]  Physical Therapy Evaluation  Patient Details  Name: Jeffrey [name] MRN: [id number] Date of Birth: Aug 31, 1940 Referring Provider: Leonie Man  Encounter Date: 01/07/2015      PT End of Session - 01/08/15 0752    Visit Number 1   Number of Visits 17   Date for PT Re-Evaluation 03/08/15   Authorization Type Blue Medicare HMO-G- code every 10th visit   PT Start Time 1017   PT Stop Time 1102   PT Time Calculation (min) 45 min   Equipment Utilized During Treatment Gait belt   Activity Tolerance Patient tolerated treatment well   Behavior During Therapy [facility] for tasks assessed/performed      Past Medical History  Diagnosis Date  . CAD (coronary artery disease)     PCI distal RCA ...2004, residual 70% LAD   /   ...nuclear...03/2007...no ischemia.Marland KitchenMarland Kitchenpreserved LV /  nuclear...03/03/2009...inferior scar..no ischemia..EF 51%  . Dyslipidemia     takes Atorvastatin daily  . Internal hemorrhoids   . Gout     takes Allopurinol daily and Colchicine as needed  . Seasonal allergic rhinitis   . Right carotid bruit   . Cyst of nasopharynx     per ENT Wilburn Cornelia  . Myocardial infarction [facility]) 72yrs ago  . Peripheral edema     takes Lasix daily  . HTN (hypertension)     takes Cardura,Metoprolol,[name],and Amlodipine daily  . Arthritis   . GERD (gastroesophageal reflux disease)     takes Omeprazole daily  . History of colon polyps   . Type 2 diabetes, uncontrolled, with neuropathy ([place]) 1989    takes Invokana daily and has an insulin pump (Dr. [name])  . 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 HCAP (healthcare-associated pneumonia)   . Hemiparesis affecting right side as late effect of cerebrovascular accident ([place]) 09/05/2014  . Anterior cerebral circulation hemorrhagic infarction [facility])  11/24/2014    March, 2016, dominant left thalamic and left internal capsule ischemic infarct with resultant hemorrhagic transformation, secondary to small vessel disease. Resulted in right hemiparesis dysarthria and diplopia   //   readmission with aphasia July, 2016, this improved     Past Surgical History  Procedure Laterality Date  . Cataract extraction Bilateral 2010  . Elbow surgery Right 1997  . Balloon angioplasty, artery  1992, 2004    CAD, Dr. Ron Parker  . Colonoscopy  04/03/2002    adenomatous polyp, int hemorrhoids  . Colonoscopy  07/18/2007    normal (Dr. Fuller Plan)  . Rotator cuff repair  09/2011    left, with subacromial decompression  . Nasal septum surgery    . Colonoscopy  09/26/2012    tubular adenoma, sm int hem, rpt 5 yrs Fuller Plan)  . Tonsillectomy and adenoidectomy    . Eye lids raised    . Cardiac catheterization  2004  . Polypectomy N/A 05/27/2013    Procedure: ENDOSCOPIC NASOPHARYNGEAL MASS;  Surgeon: [name], MD    There were no vitals filed for this visit.  Visit Diagnosis:  Muscle weakness of lower extremity  Abnormality of gait  Hemiparesis affecting right side as late effect of cerebrovascular accident [facility])      Subjective Assessment - 01/07/15 1024    Subjective Pt reports having CVA March 2016, with R sided weakness.  Pt has platform walker at home, needing constant assistance with family  for walking.  He has a knee brace and ankle brace to prevent knee hyperextension and excessive ankle inversion, but they didn't have time to put on this morning prior to eval.  He mostly stays in wheelchair during the day.  Pt finished with home health therapy 2 weeks ago.   Patient is accompained by: Family member  Daughter   Patient Stated Goals Pt's goal for therapy is to avoid losing the strength he has regained since March.  He would like to be mobile without walker.   Currently in Pain? No/denies            [facility] PT Assessment - 01/08/15 0741     Assessment   Medical Diagnosis CVA-anterior cerebral circulation hemorrhagic infarct   Referring Provider Sethi   Onset Date/Surgical Date 05/30/14  Home health therapy discharged several weeks ago   Precautions   Precautions Fall  excessive inversion of R foot w/o brace; decr. sensation RLE   Balance Screen   Has the patient fallen in the past 6 months No   Has the patient had a decrease in activity level because of a fear of falling?  No   Is the patient reluctant to leave their home because of a fear of falling?  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   Observation/Other Assessments   Focus on Therapeutic Outcomes (FOTO)  staff did not capture   ROM / Strength   AROM / PROM / Strength Strength   Strength   Strength Assessment Site Hip;Knee;Ankle   Right/Left Hip Right;Left   Right Hip Flexion 3+/5   Left Hip Flexion 5/5   Right/Left Knee Right;Left   Right Knee Flexion 3+/5   Right Knee Extension 3+/5   Left Knee Flexion 4/5   Left Knee Extension 5/5   Right/Left Ankle Right;Left   Right Ankle Dorsiflexion 4/5   Left Ankle Dorsiflexion 4/5   Transfers   Transfers Sit to Stand;Stand to Sit   Sit to Stand 4: Min assist;With upper extremity assist;From chair/3-in-1  from transport wheelchair to R platform RW   Sit to Stand Details (indicate cue type and reason) Pt requires assistance for RUE platform   Stand to Sit 4: Min assist   Ambulation/Gait   Ambulation/Gait Yes   Ambulation/Gait Assistance 4: Min assist   Ambulation/Gait Assistance Details Pt did not wear his knee brace or ankle stabilizer brace today.  Used clinic's aircast on R ankle   Ambulation Distance (Feet) 75 Feet   Assistive device Right platform walker    Gait Pattern Step-through pattern;Decreased stance time - right;Decreased step length - left;Decreased weight shift to right;Right genu recurvatum  R foot inversion, with R hip ext. rot. with foot placement   Ambulation Surface Level;Indoor   Gait velocity 82.82 sec= 0.40 ft/sec   Standardized Balance Assessment   Standardized Balance Assessment Timed Up and Go Test   Timed Up and Go Test   Normal TUG (seconds) 83.88                             PT Short Term Goals - 01/08/15 1303    PT SHORT TERM GOAL #1   Title Pt  will perform HEP with family assistance/supervision, for lower extremitiy strengthening.  TARGET 02/06/15   Time 4   Period Weeks   Status New   PT SHORT TERM GOAL #2   Title Pt will perform sit<>stand transfers with supervision, for improved efficiency and safety with transfers.   Time 4   Period Weeks   Status New   PT SHORT TERM GOAL #3   Title Pt will improve TUG score to less than or equal to 70 seconds for decreased fall risk.   Time 4   Period Weeks   Status New   PT SHORT TERM GOAL #4   Title Pt will ambulate at least 150 ft using least restrictive assistive device with minimal assistance for improved gait efficiency.   Time 4   Period Weeks   Status New   PT SHORT TERM GOAL #5   Title Pt will verbalize understanding of CVA education.   Time 4   Period Weeks   Status New           PT Long Term Goals - 01/08/15 1311    PT LONG TERM GOAL #1   Title Pt will verbalize understanding of fall prevention within home environment.  TARGET 03/08/15   Time 8   Period Weeks   Status New   PT LONG TERM GOAL #2   Title Pt will improve Timed Up and GO score to less than or equal to 50 seconds for decreased fall risk.   Time 8   Period Weeks   Status New   PT LONG TERM GOAL #3   Title Pt will improve gait velocity to at least 0.8 ft/sec for improved gait efficiency and safety.   Time 8   Period Weeks   Status New   PT LONG TERM GOAL #4    Title Berg Balance score to be assessed, with pt improving Berg score by at least 8 points.   Time 8   Period Weeks   Status New   PT LONG TERM GOAL #5   Title Pt will ambulate at least 250 ft. using least restrictive assistive device with supervision for improved efficiency and safety with gait.   Time 8   Period Weeks   Additional Long Term Goals   Additional Long Term Goals Yes   PT LONG TERM GOAL #6   Title Pt will verbalize plans for continued community fitness upon D/C from PT.   Time 8   Period Weeks   Status New               Plan - 01/08/15 0803    Clinical Impression Statement Pt is a 74 year old male who presents to OP PT status post hemorrhagic CVA in March 2016, with R hemiparesis.  Pt needs assistance for gait at home with platform RW due to excessive R knee recurvatum and excessive R foot inversion.  Pt has decreased sensation/proprioception in RLE and wears knee brace and ankle stabilizer brace for improved stability in RLE.  Pt presents with R sided weakness, decreased proprioception/sensation, decreased transfer and gait independence, decreased balance.     Pt will benefit from skilled therapeutic intervention in order to improve on the following deficits Abnormal gait;Decreased balance;Decreased mobility;Decreased coordination;Decreased strength;Difficulty walking;Impaired sensation;Impaired tone   Rehab Potential Good   PT Frequency 2x / week   PT Duration 8 weeks  plus eval   PT Treatment/Interventions ADLs/Self Care Home Management;Therapeutic exercise;Therapeutic activities;Functional mobility training;Gait training;Balance training;Neuromuscular re-education;Electrical Stimulation   PT  Next Visit Plan Berg Balance test; Initiate HEP for leg strengthening in weightbearing activities, gait training   Consulted and Agree with Plan of Care Patient;Family member/caregiver   Family Member Consulted Daughter          G-Codes - 01/08/2015 1406    Functional  Assessment Tool Used Sit<>stand min assist; gait with platform RW; TUG 83.88 seconds, gait velocity 0.4 ft/sec   Functional Limitation Mobility: Walking and moving around   Mobility: Walking and Moving Around Current Status [phone]) At least 60 percent but less than 80 percent impaired, limited or restricted   Mobility: Walking and Moving Around Goal Status [phone]) At least 40 percent but less than 60 percent impaired, limited or restricted       Problem List Patient Active Problem List   Diagnosis Date Noted  . Anterior cerebral circulation hemorrhagic infarction ([place]) 11/24/2014  . Hemiparesis affecting right side as late effect of cerebrovascular accident ([place]) 09/05/2014  . Depression due to stroke ([place]) 09/05/2014  . Thrombocytopenia ([place]) 06/05/2014  . Chronic diastolic heart failure, NYHA class 1 ([place]) 06/05/2014  . Ex-smoker 06/03/2014  . Obesity 06/03/2014  . Benign paroxysmal positional vertigo 04/28/2014  . Right carotid bruit   . Polyneuropathy, diabetic ([place]) 08/05/2012  . Seasonal allergic rhinitis   . Trigger thumb of left hand 10/12/2011  . Gout   . Adenomatous polyps   . CAD (coronary artery disease)   . Essential hypertension   . Hyperlipidemia   . Type 2 diabetes, uncontrolled, with neuropathy ([place])   . Ejection fraction     Maryjo Ragon W. 01/08/2015, 2:07 PM Frazier Butt., PT [facility] [address] [place], [place], [address] Phone: [phone]   Fax:  [phone]  Name: REGAN LLORENTE MRN: [id number] Date of Birth: [birth year]

## 2015-01-12 ENCOUNTER — Encounter: Payer: Self-pay | Admitting: Occupational Therapy

## 2015-01-12 ENCOUNTER — Ambulatory Visit: Payer: Medicare Other | Admitting: Occupational Therapy

## 2015-01-12 ENCOUNTER — Ambulatory Visit: Payer: Medicare Other | Admitting: Physical Therapy

## 2015-01-12 DIAGNOSIS — R2689 Other abnormalities of gait and mobility: Secondary | ICD-10-CM

## 2015-01-12 DIAGNOSIS — I69319 Unspecified symptoms and signs involving cognitive functions following cerebral infarction: Secondary | ICD-10-CM

## 2015-01-12 DIAGNOSIS — R278 Other lack of coordination: Secondary | ICD-10-CM

## 2015-01-12 DIAGNOSIS — R279 Unspecified lack of coordination: Secondary | ICD-10-CM

## 2015-01-12 DIAGNOSIS — R2681 Unsteadiness on feet: Secondary | ICD-10-CM

## 2015-01-12 DIAGNOSIS — R269 Unspecified abnormalities of gait and mobility: Secondary | ICD-10-CM

## 2015-01-12 DIAGNOSIS — M25611 Stiffness of right shoulder, not elsewhere classified: Secondary | ICD-10-CM

## 2015-01-12 DIAGNOSIS — H539 Unspecified visual disturbance: Secondary | ICD-10-CM

## 2015-01-12 DIAGNOSIS — G8191 Hemiplegia, unspecified affecting right dominant side: Secondary | ICD-10-CM

## 2015-01-12 DIAGNOSIS — M6281 Muscle weakness (generalized): Secondary | ICD-10-CM | POA: Diagnosis not present

## 2015-01-12 NOTE — Therapy (Signed)
[facility] [address] [place], [place], [address] Phone: [phone]   Fax:  [phone]  Physical Therapy Treatment  Patient Details  Name: Jeffrey [name] MRN: [id number] Date of Birth: 1940-12-21 Referring Provider: Leonie Man  Encounter Date: 01/12/2015      PT End of Session - 01/12/15 1805    Visit Number 2   Number of Visits 17   Date for PT Re-Evaluation 03/08/15   Authorization Type Blue Medicare HMO-G- code every 10th visit   PT Start Time 1405   PT Stop Time 1451   PT Time Calculation (min) 46 min   Equipment Utilized During Treatment Gait belt   Activity Tolerance Patient tolerated treatment well   Behavior During Therapy [facility] for tasks assessed/performed      Past Medical History  Diagnosis Date  . CAD (coronary artery disease)     PCI distal RCA ...2004, residual 70% LAD   /   ...nuclear...03/2007...no ischemia.Marland KitchenMarland Kitchenpreserved LV /  nuclear...03/03/2009...inferior scar..no ischemia..EF 51%  . Dyslipidemia     takes Atorvastatin daily  . Internal hemorrhoids   . Gout     takes Allopurinol daily and Colchicine as needed  . Seasonal allergic rhinitis   . Right carotid bruit   . Cyst of nasopharynx     per ENT Wilburn Cornelia  . Myocardial infarction [facility]) 3yrs ago  . Peripheral edema     takes Lasix daily  . HTN (hypertension)     takes Cardura,Metoprolol,[name],and Amlodipine daily  . Arthritis   . GERD (gastroesophageal reflux disease)     takes Omeprazole daily  . History of colon polyps   . Type 2 diabetes, uncontrolled, with neuropathy ([place]) 1989    takes Invokana daily and has an insulin pump (Dr. [name])  . 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 HCAP (healthcare-associated pneumonia)   . Hemiparesis affecting right side as late effect of cerebrovascular accident ([place]) 09/05/2014  . Anterior cerebral circulation hemorrhagic infarction [facility])  11/24/2014    March, 2016, dominant left thalamic and left internal capsule ischemic infarct with resultant hemorrhagic transformation, secondary to small vessel disease. Resulted in right hemiparesis dysarthria and diplopia   //   readmission with aphasia July, 2016, this improved     Past Surgical History  Procedure Laterality Date  . Cataract extraction Bilateral 2010  . Elbow surgery Right 1997  . Balloon angioplasty, artery  1992, 2004    CAD, Dr. Ron Parker  . Colonoscopy  04/03/2002    adenomatous polyp, int hemorrhoids  . Colonoscopy  07/18/2007    normal (Dr. Fuller Plan)  . Rotator cuff repair  09/2011    left, with subacromial decompression  . Nasal septum surgery    . Colonoscopy  09/26/2012    tubular adenoma, sm int hem, rpt 5 yrs Fuller Plan)  . Tonsillectomy and adenoidectomy    . Eye lids raised    . Cardiac catheterization  2004  . Polypectomy N/A 05/27/2013    Procedure: ENDOSCOPIC NASOPHARYNGEAL MASS;  Surgeon: [name], MD    There were no vitals filed for this visit.  Visit Diagnosis:  Abnormality of gait      Subjective Assessment - 01/12/15 1418    Subjective Daughter forgot to bring brace.  Denies falls or changes.   Patient is accompained by: Family member  wife and daughter   Patient Stated Goals Pt's goal for therapy is to avoid losing the strength he has regained  since March.  He would like to be mobile without walker.   Currently in Pain? No/denies                         [facility] Adult PT Treatment/Exercise - 01/12/15 1422    Berg Balance Test   Sit to Stand Able to stand using hands after several tries   Standing Unsupported Able to stand 2 minutes with supervision   Sitting with Back Unsupported but Feet Supported on Floor or Stool Able to sit safely and securely 2 minutes   Stand to Sit Sits safely with minimal use of hands   Transfers Needs one person to assist   Standing Unsupported with Eyes Closed Able to stand 10 seconds with  supervision   Standing Ubsupported with Feet Together Needs help to attain position but able to stand for 30 seconds with feet together   From Standing, Reach Forward with Outstretched Arm Can reach forward >5 cm safely (2")   From Standing Position, Pick up Object from Floor Unable to try/needs assist to keep balance   From Standing Position, Turn to Look Behind Over each Shoulder Needs supervision when turning   Turn 360 Degrees Needs assistance while turning   Standing Unsupported, Alternately Place Feet on Step/Stool Needs assistance to keep from falling or unable to try   Standing Unsupported, One Foot in [employer] balance while stepping or standing   Standing on One Leg Unable to try or needs assist to prevent fall   Total Score 21       Worked on safety with transfers and sit<>stand.  Pt needed min assist overall for transfers. Discussed pt's current R knee cage and Procare ankle brace vs AFO options.           PT Education - 01/12/15 1804    Education provided Yes   Education Details General process for obtaining AFO if needed, bringing pt's ankle and knee brace next session along with current HEP   Person(s) Educated Patient;Spouse;Child(ren)   Methods Explanation   Comprehension Verbalized understanding          PT Short Term Goals - 01/08/15 1303    PT SHORT TERM GOAL #1   Title Pt will perform HEP with family assistance/supervision, for lower extremitiy strengthening.  TARGET 02/06/15   Time 4   Period Weeks   Status New   PT SHORT TERM GOAL #2   Title Pt will perform sit<>stand transfers with supervision, for improved efficiency and safety with transfers.   Time 4   Period Weeks   Status New   PT SHORT TERM GOAL #3   Title Pt will improve TUG score to less than or equal to 70 seconds for decreased fall risk.   Time 4   Period Weeks   Status New   PT SHORT TERM GOAL #4   Title Pt will ambulate at least 150 ft using least restrictive assistive device with  minimal assistance for improved gait efficiency.   Time 4   Period Weeks   Status New   PT SHORT TERM GOAL #5   Title Pt will verbalize understanding of CVA education.   Time 4   Period Weeks   Status New           PT Long Term Goals - 01/08/15 1311    PT LONG TERM GOAL #1   Title Pt will verbalize understanding of fall prevention within home environment.  TARGET 03/08/15  Time 8   Period Weeks   Status New   PT LONG TERM GOAL #2   Title Pt will improve Timed Up and GO score to less than or equal to 50 seconds for decreased fall risk.   Time 8   Period Weeks   Status New   PT LONG TERM GOAL #3   Title Pt will improve gait velocity to at least 0.8 ft/sec for improved gait efficiency and safety.   Time 8   Period Weeks   Status New   PT LONG TERM GOAL #4   Title Berg Balance score to be assessed, with pt improving Berg score by at least 8 points.   Time 8   Period Weeks   Status New   PT LONG TERM GOAL #5   Title Pt will ambulate at least 250 ft. using least restrictive assistive device with supervision for improved efficiency and safety with gait.   Time 8   Period Weeks   Additional Long Term Goals   Additional Long Term Goals Yes   PT LONG TERM GOAL #6   Title Pt will verbalize plans for continued community fitness upon D/C from PT.   Time 8   Period Weeks   Status New               Plan - 01/12/15 1806    Clinical Impression Statement Pt and family report using toe off AFO while at SNF and PT there felt like it was the best option for pt but there were concerns/issues over the cost so family chose knee cage and ankle brace.  Family now desires to pursue AFO.  Pt continues with excessive R knee recurvatum and R foot inversion along with increased tone/ataxia on R side.  Family appears very support and desires to increase pts independency as much as possible.  Continue PT per POC.  Above discussed with Mady Haagensen, PT.   Pt will benefit from skilled  therapeutic intervention in order to improve on the following deficits Abnormal gait;Decreased balance;Decreased mobility;Decreased coordination;Decreased strength;Difficulty walking;Impaired sensation;Impaired tone   Rehab Potential Good   PT Frequency 2x / week   PT Duration 8 weeks  plus eval   PT Treatment/Interventions ADLs/Self Care Home Management;Therapeutic exercise;Therapeutic activities;Functional mobility training;Gait training;Balance training;Neuromuscular re-education;Electrical Stimulation   PT Next Visit Plan Gait with patients R knee cage and ankle brace vs AFO's from clinic and determine if another bracing option may be available;Review pts current HEP if family brings in handouts   Consulted and Agree with Plan of Care Patient;Family member/caregiver   Family Member Consulted Daughter        Problem List Patient Active Problem List   Diagnosis Date Noted  . Anterior cerebral circulation hemorrhagic infarction ([place]) 11/24/2014  . Hemiparesis affecting right side as late effect of cerebrovascular accident ([place]) 09/05/2014  . Depression due to stroke ([place]) 09/05/2014  . Thrombocytopenia ([place]) 06/05/2014  . Chronic diastolic heart failure, NYHA class 1 ([place]) 06/05/2014  . Ex-smoker 06/03/2014  . Obesity 06/03/2014  . Benign paroxysmal positional vertigo 04/28/2014  . Right carotid bruit   . Polyneuropathy, diabetic ([place]) 08/05/2012  . Seasonal allergic rhinitis   . Trigger thumb of left hand 10/12/2011  . Gout   . Adenomatous polyps   . CAD (coronary artery disease)   . Essential hypertension   . Hyperlipidemia   . Type 2 diabetes, uncontrolled, with neuropathy ([place])   . Ejection fraction     Narda Bonds 01/12/2015, 7:16  PM  [facility] [address] [place], [place], [address] Phone: [phone]   Fax:  [phone]  Name: Jeffrey [name] MRN: [id number] Date of Birth:  11/08/1940   Narda Bonds, [place] 01/12/2015 7:16 PM Phone: [phone] Fax: [phone]

## 2015-01-13 NOTE — Therapy (Signed)
[facility] [address] [place], [place], [address] Phone: [phone]   Fax:  [phone]  Occupational Therapy Evaluation  Patient Details  Name: Jeffrey [name] MRN: [id number] Date of Birth: 1941/01/29 Referring Provider: Dr. Leonie Man  Encounter Date: 01/12/2015      OT End of Session - 01/13/15 2042    Visit Number 1   Number of Visits 17   Date for OT Re-Evaluation 03/13/15   Authorization Type Blue Medicare HMO, no visit limit/no auth, G-code   Authorization - Visit Number 1   Authorization - Number of Visits 10   OT Start Time 5638   OT Stop Time 1402   OT Time Calculation (min) 45 min   Activity Tolerance Patient tolerated treatment well   Behavior During Therapy [facility] for tasks assessed/performed      Past Medical History  Diagnosis Date  . CAD (coronary artery disease)     PCI distal RCA ...2004, residual 70% LAD   /   ...nuclear...03/2007...no ischemia.Marland KitchenMarland Kitchenpreserved LV /  nuclear...03/03/2009...inferior scar..no ischemia..EF 51%  . Dyslipidemia     takes Atorvastatin daily  . Internal hemorrhoids   . Gout     takes Allopurinol daily and Colchicine as needed  . Seasonal allergic rhinitis   . Right carotid bruit   . Cyst of nasopharynx     per ENT Wilburn Cornelia  . Myocardial infarction [facility]) 50yrs ago  . Peripheral edema     takes Lasix daily  . HTN (hypertension)     takes Cardura,Metoprolol,[name],and Amlodipine daily  . Arthritis   . GERD (gastroesophageal reflux disease)     takes Omeprazole daily  . History of colon polyps   . Type 2 diabetes, uncontrolled, with neuropathy ([place]) 1989    takes Invokana daily and has an insulin pump (Dr. [name])  . 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 HCAP (healthcare-associated pneumonia)   . Hemiparesis affecting right side as late effect of cerebrovascular accident ([place]) 09/05/2014  . Anterior cerebral  circulation hemorrhagic infarction [facility]) 11/24/2014    March, 2016, dominant left thalamic and left internal capsule ischemic infarct with resultant hemorrhagic transformation, secondary to small vessel disease. Resulted in right hemiparesis dysarthria and diplopia   //   readmission with aphasia July, 2016, this improved     Past Surgical History  Procedure Laterality Date  . Cataract extraction Bilateral 2010  . Elbow surgery Right 1997  . Balloon angioplasty, artery  1992, 2004    CAD, Dr. Ron Parker  . Colonoscopy  04/03/2002    adenomatous polyp, int hemorrhoids  . Colonoscopy  07/18/2007    normal (Dr. Fuller Plan)  . Rotator cuff repair  09/2011    left, with subacromial decompression  . Nasal septum surgery    . Colonoscopy  09/26/2012    tubular adenoma, sm int hem, rpt 5 yrs Fuller Plan)  . Tonsillectomy and adenoidectomy    . Eye lids raised    . Cardiac catheterization  2004  . Polypectomy N/A 05/27/2013    Procedure: ENDOSCOPIC NASOPHARYNGEAL MASS;  Surgeon: [name], MD    There were no vitals filed for this visit.  Visit Diagnosis:  Right hemiparesis (HCC)  Stiffness of right shoulder joint  Decreased coordination  Unsteadiness  Decreased functional mobility  Cognitive deficits following cerebral infarction  Visual disturbance      Subjective Assessment - 01/12/15 1319    Subjective  Pt reports not using RUE much  at all   Patient is accompained by: Family member  daughter, wife   Patient Stated Goals improve use of RUE, drive, be able to feel normal around people   Currently in Pain? No/denies           [facility] OT Assessment - 01/13/15 0001    Assessment   Diagnosis CVA (anterior cerebral circulation hemorrhagic infarction)   Referring Provider Dr. Leonie Man   Onset Date --  05/2014   Prior Therapy CIR, home health therapy   Precautions   Precautions Fall   Balance Screen   Has the patient fallen in the past 6 months No   Home  Environment   Family/patient  expects to be discharged to: Private residence   Living Arrangements Spouse/significant other   Available Help at Discharge --  wife with limited ability to assist physically   Lives With Spouse   Prior Function   Level of [place] --  was pastor but retired 4 months prior to [place] going out to eat, visiting people, reading   ADL   Eating/Feeding Needs assist with cutting food  with LUE only   Grooming Modified independent  with LUE   Upper Body Bathing Minimal assistance   Lower Body Bathing Minimal assistance   Upper Body Dressing Needs assist for fasteners   Lower Body Dressing Needs assist for fasteners   Toilet Tranfer Modified independent  uses urinal at night and has bed pole   Toileting - Clothing Manipulation Modified independent   [place]   Tub/Shower Transfer Supervision/safety   ADL comments has shower seat   IADL   Prior Level of Function Light Housekeeping wife performed most of cleaning   Prior Level of Function Meal Prep wife performed    Prior Level of Function Community Mobility drove   [employer] Relies on family or friends for transportation  unable to drive   Medication Management --  wife performs due to injections   Prior Level of Function Financial Management pt performed   Financial Management --  family performing    Mobility   Mobility Status Comments Pt primarily uses w/c in the home.  Also has transport w/c.  Uses RW with CGA.  Basic transfers mod I   Written Expression   Dominant Hand Right   Handwriting --  unable with RUE, signs with LUE   Vision - History   Baseline Vision Wears glasses only for reading   Additional Comments Pt reports diplopia without glasses, just has blurriness with new prism glasses   Vision Assessment   Comment Will assess vision further in functional context prn   Cognition   Overall Cognitive Status Impaired/Different from baseline   Will assess further in functional context prn   Memory Impaired   Memory Impairment Decreased short term memory  per pt   Sensation   Light Touch Impaired by gross assessment   Hot/Cold Impaired by gross assessment   Proprioception Impaired by gross assessment   Additional Comments per pt/family report   Coordination   Gross Motor Movements are Fluid and Coordinated No   Fine Motor Movements are Fluid and Coordinated No   Coordination and Movement Description ataxic   9 Hole Peg Test Right   Right 9 Hole Peg Test unable    Box and Blocks R-9blocks, L-50blocks   Other Pt reports that he is not using RUE functionally for ADLs   Coordination Difficulty with coordinated finger  opposition   Tone   Assessment Location Right Upper Extremity   AROM   Overall AROM  Deficits   Overall AROM Comments Grossly WFL RUE (with decr coordination) except only 45* shoulder flex with min compensation, approx 25% shoulder abduction, ER,   Hand Function   Right Hand Grip (lbs) 35   Left Hand Grip (lbs) 80   RUE Tone   RUE Tone Mild                           OT Short Term Goals - 01/13/15 2050    OT SHORT TERM GOAL #1   Title Pt will be independent with initial HEP.--check STGs 02/10/15   Time 4   Period Weeks   Status New   OT SHORT TERM GOAL #2   Title Pt will use RUE as gross A/stabilizer at least 25% of the time for ADLs.   Time 4   Period Weeks   Status New   OT SHORT TERM GOAL #3   Title Pt will demo at least 55* R shoulder flex with min compensation in prep for functional reach.   Baseline 45*   Time 4   Period Weeks   Status New   OT SHORT TERM GOAL #4   Title Pt will be able to complete clothing fasteners (buttons, tying shoes) mod I using AE prn.   Time 4   Period Weeks   Status New           OT Long Term Goals - 01/13/15 2053    OT LONG TERM GOAL #1   Title Pt will be independent with updated HEP.--check LTGs 03/13/15   Time 8   Period Weeks    Status New   OT LONG TERM GOAL #2   Title Pt will use RUE as gross A/stabilizer for ADLs at least 50% of the time.   Time 8   Period Weeks   Status New   OT LONG TERM GOAL #3   Title Pt will demo at least 65* R shoulder flex with min compensation in prep for functional reach.   Baseline 45*   Time 8   Period Weeks   Status New   OT LONG TERM GOAL #4   Title Pt will improve RUE functional reaching/coordination for ADLs as shown by improving score on box and blocks test to at least 15 blocks.   Baseline 9 blocks   Time 8   Period Weeks   Status New   OT LONG TERM GOAL #5   Title Pt will perform bathing with supervision.   Time 8   Period Weeks   Status New   Long Term Additional Goals   Additional Long Term Goals Yes   OT LONG TERM GOAL #6   Title Pt will verbalize understanding of cognitive/visual compensation strategies and activities to improve cognition/vision prn.   Time 8   Period Weeks   Status New               Plan - 01/13/15 2044    Clinical Impression Statement Pt is a 74 y.o. male s/p anterior cerbral circulation hemorrhagic infarction in 05/2014.  Pt recently completed home health therapies.   Pt presents today with R hemiparesis with ataxia, decr coordination, decr sensation, decr strength, decr ROM, cognitive deficits, visual deficits, decr balance/functional mobility, and decr dominant RUE functional use.  Pt would benefit from occupational therapy to improve independence/safety with ADLs, improve  dominant RUE functional use, and improve quality of life.   Pt will benefit from skilled therapeutic intervention in order to improve on the following deficits (Retired) Decreased cognition;Decreased mobility;Decreased strength;Impaired vision/preception;Impaired UE functional use;Decreased knowledge of use of DME;Decreased balance;Impaired tone;Impaired sensation;Decreased coordination;Decreased range of motion   Rehab Potential Good   Clinical Impairments Affecting  Rehab Potential severity of deficits, sensory deficits   OT Frequency 2x / week   OT Duration 8 weeks  +eval   OT Treatment/Interventions Self-care/ADL training;Therapeutic exercise;Functional Mobility Training;Patient/family education;Ultrasound;Neuromuscular education;Manual Therapy;Splinting;Therapeutic exercises;Cryotherapy;Parrafin;DME and/or AE instruction;Therapeutic activities;Cognitive remediation/compensation;Visual/perceptual remediation/compensation;Passive range of motion;Moist Heat;Contrast Bath;Fluidtherapy;Electrical Stimulation   Plan initiate HEP for RUE   Consulted and Agree with Plan of Care Patient;Family member/caregiver   Family Member Consulted wife, daughter          [place] - Feb 03, 2015 27-Jun-2057    Functional Assessment Tool Used not using RUE functionally, box and blocks RUE=9 blocks   Functional Limitation Carrying, moving and handling objects   Carrying, Moving and Handling Objects Current Status [phone]) At least 80 percent but less than 100 percent impaired, limited or restricted   Carrying, Moving and Handling Objects Goal Status ([id number]) At least 40 percent but less than 60 percent impaired, limited or restricted      Problem List Patient Active Problem List   Diagnosis Date Noted  . Anterior cerebral circulation hemorrhagic infarction ([place]) 11/24/2014  . Hemiparesis affecting right side as late effect of cerebrovascular accident ([place]) 09/05/2014  . Depression due to stroke ([place]) 09/05/2014  . Thrombocytopenia ([place]) 06/05/2014  . Chronic diastolic heart failure, NYHA class 1 ([place]) 06/05/2014  . Ex-smoker 06/03/2014  . Obesity 06/03/2014  . Benign paroxysmal positional vertigo 04/28/2014  . Right carotid bruit   . Polyneuropathy, diabetic ([place]) 08/05/2012  . Seasonal allergic rhinitis   . Trigger thumb of left hand 10/12/2011  . Gout   . Adenomatous polyps   . CAD (coronary artery disease)   . Essential hypertension   . Hyperlipidemia   . Type 2  diabetes, uncontrolled, with neuropathy ([place])   . Ejection fraction     [facility] 01/13/2015, 9:00 PM  [facility] [address] [place], [place], [address] Phone: [phone]   Fax:  [phone]  Name: KACPER CARTLIDGE MRN: [id number] Date of Birth: 1940/12/19  Vianne Bulls, OTR/L 01/13/2015 9:00 PM

## 2015-01-14 ENCOUNTER — Encounter: Payer: Self-pay | Admitting: Rehabilitation

## 2015-01-14 ENCOUNTER — Telehealth: Payer: Self-pay | Admitting: Rehabilitation

## 2015-01-14 ENCOUNTER — Ambulatory Visit: Payer: Medicare Other | Admitting: Rehabilitation

## 2015-01-14 DIAGNOSIS — R2689 Other abnormalities of gait and mobility: Secondary | ICD-10-CM

## 2015-01-14 DIAGNOSIS — R2681 Unsteadiness on feet: Secondary | ICD-10-CM

## 2015-01-14 DIAGNOSIS — R269 Unspecified abnormalities of gait and mobility: Secondary | ICD-10-CM

## 2015-01-14 DIAGNOSIS — M6281 Muscle weakness (generalized): Secondary | ICD-10-CM | POA: Diagnosis not present

## 2015-01-14 DIAGNOSIS — R278 Other lack of coordination: Secondary | ICD-10-CM

## 2015-01-14 DIAGNOSIS — R279 Unspecified lack of coordination: Secondary | ICD-10-CM

## 2015-01-14 NOTE — Telephone Encounter (Signed)
Dr. Leonie Man,  [name] has been utilizing a R AFO brace during PT session to address R foot drop. When wearing R AFO, patient demonstrates less frequent losses of balance while ambulating. The patient would benefit from right LE brace to decrease fall risk.  If you agree, please submit an order for a R AFO.  Thank you, Cameron Sprang, PT, MPT [facility] [address] [place], [place], [address] Phone: [phone]   Fax:  [phone] 01/14/2015, 6:01 PM

## 2015-01-14 NOTE — Therapy (Signed)
[facility] [address] [place], [place], [address] Phone: [phone]   Fax:  [phone]  Physical Therapy Treatment  Patient Details  Name: Jeffrey [name] MRN: [id number] Date of Birth: [birth year] Referring Provider: Leonie Man  Encounter Date: 01/14/2015      PT End of Session - 01/14/15 1805    Visit Number 3   Number of Visits 17   Date for PT Re-Evaluation 03/08/15   Authorization Type Blue Medicare HMO-G- code every 10th visit   PT Start Time 1145   PT Stop Time 1235   PT Time Calculation (min) 50 min   Equipment Utilized During Treatment Gait belt   Activity Tolerance Patient tolerated treatment well   Behavior During Therapy [facility] for tasks assessed/performed      Past Medical History  Diagnosis Date  . CAD (coronary artery disease)     PCI distal RCA ...2004, residual 70% LAD   /   ...nuclear...03/2007...no ischemia.Marland KitchenMarland Kitchenpreserved LV /  nuclear...03/03/2009...inferior scar..no ischemia..EF 51%  . Dyslipidemia     takes Atorvastatin daily  . Internal hemorrhoids   . Gout     takes Allopurinol daily and Colchicine as needed  . Seasonal allergic rhinitis   . Right carotid bruit   . Cyst of nasopharynx     per ENT Wilburn Cornelia  . Myocardial infarction [facility]) 63yrs ago  . Peripheral edema     takes Lasix daily  . HTN (hypertension)     takes Cardura,Metoprolol,[name],and Amlodipine daily  . Arthritis   . GERD (gastroesophageal reflux disease)     takes Omeprazole daily  . History of colon polyps   . Type 2 diabetes, uncontrolled, with neuropathy ([place]) 1989    takes Invokana daily and has an insulin pump (Dr. [name])  . 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 HCAP (healthcare-associated pneumonia)   . Hemiparesis affecting right side as late effect of cerebrovascular accident ([place]) 09/05/2014  . Anterior cerebral circulation hemorrhagic infarction [facility])  11/24/2014    March, 2016, dominant left thalamic and left internal capsule ischemic infarct with resultant hemorrhagic transformation, secondary to small vessel disease. Resulted in right hemiparesis dysarthria and diplopia   //   readmission with aphasia July, 2016, this improved     Past Surgical History  Procedure Laterality Date  . Cataract extraction Bilateral 2010  . Elbow surgery Right 1997  . Balloon angioplasty, artery  1992, 2004    CAD, Dr. Ron Parker  . Colonoscopy  04/03/2002    adenomatous polyp, int hemorrhoids  . Colonoscopy  07/18/2007    normal (Dr. Fuller Plan)  . Rotator cuff repair  09/2011    left, with subacromial decompression  . Nasal septum surgery    . Colonoscopy  09/26/2012    tubular adenoma, sm int hem, rpt 5 yrs Fuller Plan)  . Tonsillectomy and adenoidectomy    . Eye lids raised    . Cardiac catheterization  2004  . Polypectomy N/A 05/27/2013    Procedure: ENDOSCOPIC NASOPHARYNGEAL MASS;  Surgeon: [name], MD    There were no vitals filed for this visit.  Visit Diagnosis:  Abnormality of gait  Unsteadiness  Decreased coordination  Decreased functional mobility      Subjective Assessment - 01/14/15 1803    Subjective "I'll do whatever you think is best, but I hate wearing this foot brace."    Patient is accompained by: Family member   Patient Stated Goals Pt's goal for  therapy is to avoid losing the strength he has regained since March.  He would like to be mobile without walker.   Currently in Pain? No/denies            Self Care:  Discussion with pt and family regarding need for AFO for RLE clearance and ankle support as well as assisting to prevent R knee forceful extension during stance phase of gait.  PT to contact rep from [employer] to determine why pt may have been denied at SNF vs sending order request to [facility] when pt/family go next week for Audiology visit.  Also educated on performing 3 exercises from packet brought in by family to continue  strengthening and NMR in RLE.  Pt and family verbalized understanding.    Gait:  Performed 3 reps of 34' with use of R PFRW at min/guard A first with use of pts R ASO (adjusted to increase ankle stability and promote decreased inversion) and R knee cage.  Note that pt has somewhat decreased foot clearance but good control at R knee.  Also tends to "kick stand" RLE (step with RLE abducted from body) during swing and initial contact.  Also note that pt is unable to don ASO on his own due to deficits in RUE and is cumbersome for family to assist with donning.  Second trial including donning of R blue rocker AFO to assist foot clearance and control at knee.  Also added small heel wedge to decrease forceful R knee extension during stance.  Again, pt with good control at knee and noted improvement with R foot clearance.  Still has some toe catch initially but is able to clear.  Also still note R LE abd continued with this trial as well.  Unsure if this is due to decreased sensation/proprioception vs tone vs "leg length" difference due to addition to heel wedge.  Therefore third trial, PT donned small shoe lift to LLE to assess if this would assist with RLE clearance and more aligned stepping technique.  Note slight improvement, however still seems to abduct RLE at times.                       PT Education - 01/14/15 1803    Education provided Yes   Education Details Again, educated on process of obtaining AFO, [facility] as well as checking with [employer] as to why pt denied at SNF.     Person(s) Educated Patient;Spouse;Child(ren)   Methods Explanation   Comprehension Verbalized understanding          PT Short Term Goals - 01/08/15 1303    PT SHORT TERM GOAL #1   Title Pt will perform HEP with family assistance/supervision, for lower extremitiy strengthening.  TARGET 02/06/15   Time 4   Period Weeks   Status New   PT SHORT TERM GOAL #2   Title Pt will perform sit<>stand  transfers with supervision, for improved efficiency and safety with transfers.   Time 4   Period Weeks   Status New   PT SHORT TERM GOAL #3   Title Pt will improve TUG score to less than or equal to 70 seconds for decreased fall risk.   Time 4   Period Weeks   Status New   PT SHORT TERM GOAL #4   Title Pt will ambulate at least 150 ft using least restrictive assistive device with minimal assistance for improved gait efficiency.   Time 4   Period Weeks  Status New   PT SHORT TERM GOAL #5   Title Pt will verbalize understanding of CVA education.   Time 4   Period Weeks   Status New           PT Long Term Goals - 01/08/15 1311    PT LONG TERM GOAL #1   Title Pt will verbalize understanding of fall prevention within home environment.  TARGET 03/08/15   Time 8   Period Weeks   Status New   PT LONG TERM GOAL #2   Title Pt will improve Timed Up and GO score to less than or equal to 50 seconds for decreased fall risk.   Time 8   Period Weeks   Status New   PT LONG TERM GOAL #3   Title Pt will improve gait velocity to at least 0.8 ft/sec for improved gait efficiency and safety.   Time 8   Period Weeks   Status New   PT LONG TERM GOAL #4   Title Berg Balance score to be assessed, with pt improving Berg score by at least 8 points.   Time 8   Period Weeks   Status New   PT LONG TERM GOAL #5   Title Pt will ambulate at least 250 ft. using least restrictive assistive device with supervision for improved efficiency and safety with gait.   Time 8   Period Weeks   Additional Long Term Goals   Additional Long Term Goals Yes   PT LONG TERM GOAL #6   Title Pt will verbalize plans for continued community fitness upon D/C from PT.   Time 8   Period Weeks   Status New               Plan - 01/14/15 1806    Clinical Impression Statement Skilled session focused on addressing/assessment of gait with pts personal ASO and R knee cage as well as other options for AFOs to  determine which makes pt most functional and demonstrate safe gait mechanics.  Feel that R blue rocker or reaction AFO may do best with addition of possible toe cap for increased clearance.     Pt will benefit from skilled therapeutic intervention in order to improve on the following deficits Abnormal gait;Decreased balance;Decreased mobility;Decreased coordination;Decreased strength;Difficulty walking;Impaired sensation;Impaired tone   Rehab Potential Good   PT Frequency 2x / week   PT Duration 8 weeks  plus eval   PT Treatment/Interventions ADLs/Self Care Home Management;Therapeutic exercise;Therapeutic activities;Functional mobility training;Gait training;Balance training;Neuromuscular re-education;Electrical Stimulation   PT Next Visit Plan Assess compliance with 3 HEP exercises given during last visit, R NMR, if rep present from [employer], assess for appropriate brace.    Consulted and Agree with Plan of Care Patient;Family member/caregiver   Family Member Consulted Daughter, and wife        Problem List Patient Active Problem List   Diagnosis Date Noted  . Anterior cerebral circulation hemorrhagic infarction ([place]) 11/24/2014  . Hemiparesis affecting right side as late effect of cerebrovascular accident ([place]) 09/05/2014  . Depression due to stroke ([place]) 09/05/2014  . Thrombocytopenia ([place]) 06/05/2014  . Chronic diastolic heart failure, NYHA class 1 ([place]) 06/05/2014  . Ex-smoker 06/03/2014  . Obesity 06/03/2014  . Benign paroxysmal positional vertigo 04/28/2014  . Right carotid bruit   . Polyneuropathy, diabetic ([place]) 08/05/2012  . Seasonal allergic rhinitis   . Trigger thumb of left hand 10/12/2011  . Gout   . Adenomatous polyps   . CAD (coronary artery  disease)   . Essential hypertension   . Hyperlipidemia   . Type 2 diabetes, uncontrolled, with neuropathy ([place])   . Ejection fraction    Cameron Sprang, PT, MPT [facility] [address]  [place], [place], [address] Phone: [phone]   Fax:  [phone] 01/14/2015, 6:11 PM  Name: Jeffrey [name] MRN: [id number] Date of Birth: Jan 07, 1941

## 2015-01-15 ENCOUNTER — Other Ambulatory Visit: Payer: Self-pay

## 2015-01-15 DIAGNOSIS — I6389 Other cerebral infarction: Secondary | ICD-10-CM

## 2015-01-15 NOTE — Telephone Encounter (Signed)
I agree and will do so

## 2015-01-15 NOTE — Telephone Encounter (Signed)
Rn call Raquel Sarna and left message that orders will be in for [facility]  Brace at rehab. Order is in for patient.

## 2015-01-19 ENCOUNTER — Ambulatory Visit: Payer: Medicare Other | Admitting: Physical Therapy

## 2015-01-19 ENCOUNTER — Ambulatory Visit: Payer: Medicare Other | Admitting: Occupational Therapy

## 2015-01-19 DIAGNOSIS — R279 Unspecified lack of coordination: Secondary | ICD-10-CM

## 2015-01-19 DIAGNOSIS — R278 Other lack of coordination: Secondary | ICD-10-CM

## 2015-01-19 DIAGNOSIS — M25611 Stiffness of right shoulder, not elsewhere classified: Secondary | ICD-10-CM

## 2015-01-19 DIAGNOSIS — G8191 Hemiplegia, unspecified affecting right dominant side: Secondary | ICD-10-CM

## 2015-01-19 DIAGNOSIS — R269 Unspecified abnormalities of gait and mobility: Secondary | ICD-10-CM

## 2015-01-19 DIAGNOSIS — R2689 Other abnormalities of gait and mobility: Secondary | ICD-10-CM

## 2015-01-19 DIAGNOSIS — M6281 Muscle weakness (generalized): Secondary | ICD-10-CM | POA: Diagnosis not present

## 2015-01-19 DIAGNOSIS — I69351 Hemiplegia and hemiparesis following cerebral infarction affecting right dominant side: Secondary | ICD-10-CM

## 2015-01-19 NOTE — Patient Instructions (Addendum)
Perform 1-2x/day using slow, controlled movements   1.  Lay on your back, hold ball/stick/foam noodle/pillow (shoulder width, with thumbs facing up).  Start with elbows bent and then straighten out.  x10  2.  Lay on your back, hold ball/stick/foam noodle/pillow (shoulder width, with thumbs facing up).  Start with elbows straight on tops of legs, then slowly raise up to chin height keeping arms even.  x10  3.  Repeat #2 in sitting, but only raise as far as you can without hiking shoulder.x10  4.  In standing, continue with weightbearing on right hand, while reaching for cans with left arm.  Then repeat with right arm.

## 2015-01-19 NOTE — Therapy (Signed)
[facility] [address] [place], [place], [address] Phone: [phone]   Fax:  [phone]  Physical Therapy Treatment  Patient Details  Name: Jeffrey [name] MRN: [id number] Date of Birth: [birth year] Referring Provider: Leonie Man  Encounter Date: 01/19/2015      PT End of Session - 01/19/15 1727    Visit Number 4   Number of Visits 17   Date for PT Re-Evaluation 03/08/15   Authorization Type Blue Medicare HMO-G- code every 10th visit   PT Start Time 1400   PT Stop Time 1447   PT Time Calculation (min) 47 min   Activity Tolerance Patient tolerated treatment well   Behavior During Therapy [facility] for tasks assessed/performed      Past Medical History  Diagnosis Date  . CAD (coronary artery disease)     PCI distal RCA ...2004, residual 70% LAD   /   ...nuclear...03/2007...no ischemia.Marland KitchenMarland Kitchenpreserved LV /  nuclear...03/03/2009...inferior scar..no ischemia..EF 51%  . Dyslipidemia     takes Atorvastatin daily  . Internal hemorrhoids   . Gout     takes Allopurinol daily and Colchicine as needed  . Seasonal allergic rhinitis   . Right carotid bruit   . Cyst of nasopharynx     per ENT Wilburn Cornelia  . Myocardial infarction [facility]) 15yrs ago  . Peripheral edema     takes Lasix daily  . HTN (hypertension)     takes Cardura,Metoprolol,[name],and Amlodipine daily  . Arthritis   . GERD (gastroesophageal reflux disease)     takes Omeprazole daily  . History of colon polyps   . Type 2 diabetes, uncontrolled, with neuropathy ([place]) 1989    takes Invokana daily and has an insulin pump (Dr. [name])  . 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 HCAP (healthcare-associated pneumonia)   . Hemiparesis affecting right side as late effect of cerebrovascular accident ([place]) 09/05/2014  . Anterior cerebral circulation hemorrhagic infarction [facility]) 11/24/2014    March, 2016, dominant left thalamic and  left internal capsule ischemic infarct with resultant hemorrhagic transformation, secondary to small vessel disease. Resulted in right hemiparesis dysarthria and diplopia   //   readmission with aphasia July, 2016, this improved     Past Surgical History  Procedure Laterality Date  . Cataract extraction Bilateral 2010  . Elbow surgery Right 1997  . Balloon angioplasty, artery  1992, 2004    CAD, Dr. Ron Parker  . Colonoscopy  04/03/2002    adenomatous polyp, int hemorrhoids  . Colonoscopy  07/18/2007    normal (Dr. Fuller Plan)  . Rotator cuff repair  09/2011    left, with subacromial decompression  . Nasal septum surgery    . Colonoscopy  09/26/2012    tubular adenoma, sm int hem, rpt 5 yrs Fuller Plan)  . Tonsillectomy and adenoidectomy    . Eye lids raised    . Cardiac catheterization  2004  . Polypectomy N/A 05/27/2013    Procedure: ENDOSCOPIC NASOPHARYNGEAL MASS;  Surgeon: [name], MD    There were no vitals filed for this visit.  Visit Diagnosis:  Abnormality of gait  Decreased coordination  Right hemiparesis ([place])      Subjective Assessment - 01/19/15 1714    Subjective Pt's wife/daughter report pt has difficulty donning/doffing R ASO but pt's R ankle is unstable without brace.   Patient is accompained by: Family member   Patient Stated Goals Pt's goal for therapy is to avoid losing the strength he  has regained since March.  He would like to be mobile without walker.   Currently in Pain? No/denies                         [facility] Adult PT Treatment/Exercise - 01/19/15 0001    Transfers   Transfers Sit to Stand;Stand to Sit   Sit to Stand 4: Min assist   Sit to Stand Details (indicate cue type and reason) to R PFRW   Stand to Sit 4: Min assist   Stand to Sit Details to R PFRW   Ambulation/Gait   Ambulation/Gait Yes   Ambulation/Gait Assistance 4: Min assist   Ambulation Distance (Feet) 100 Feet  x4   Assistive device Right platform walker  R Reaction AFO    Gait Pattern Step-through pattern;Decreased stance time - right;Decreased step length - left;Decreased weight shift to right;Right genu recurvatum;Ataxic  R foot inversion, with R hip ext. rot. with foot placement   Ambulation Surface Level;Indoor   Gait velocity --   Gait Comments Pt performed multiple gait trials x100' total with orthotist present to assess gait pattern using the following: R Reaction AFO without heel wedge; R AFO with heel wedge; R AFO (Reaction) with heel wedge and R Swedish knee cage. Using R AFO and heel wedge in R shoe, pt with consistent RLE clearance and minimal (low velocity) R genu reucurvatum. Using both R Reacton AFO and R knee cage, pt demonstrated ocnsistent RLE clearance and no R genur recurvatum.                PT Education - 01/19/15 1716    Education provided Yes   Education Details Recommending R Reaction AFO, heel wedge for household distances and AFO + R knee cage for community distances. Until R AFO received (~2 weeks) recommending gait at home with daughter using R knee cage, R ASO, R PFRW.   Person(s) Educated Patient;Spouse;Child(ren)   Methods Explanation   Comprehension Verbalized understanding          PT Short Term Goals - 01/08/15 1303    PT SHORT TERM GOAL #1   Title Pt will perform HEP with family assistance/supervision, for lower extremitiy strengthening.  TARGET 02/06/15   Time 4   Period Weeks   Status New   PT SHORT TERM GOAL #2   Title Pt will perform sit<>stand transfers with supervision, for improved efficiency and safety with transfers.   Time 4   Period Weeks   Status New   PT SHORT TERM GOAL #3   Title Pt will improve TUG score to less than or equal to 70 seconds for decreased fall risk.   Time 4   Period Weeks   Status New   PT SHORT TERM GOAL #4   Title Pt will ambulate at least 150 ft using least restrictive assistive device with minimal assistance for improved gait efficiency.   Time 4   Period Weeks    Status New   PT SHORT TERM GOAL #5   Title Pt will verbalize understanding of CVA education.   Time 4   Period Weeks   Status New           PT Long Term Goals - 01/08/15 1311    PT LONG TERM GOAL #1   Title Pt will verbalize understanding of fall prevention within home environment.  TARGET 03/08/15   Time 8   Period Weeks   Status New   PT LONG TERM  GOAL #2   Title Pt will improve Timed Up and GO score to less than or equal to 50 seconds for decreased fall risk.   Time 8   Period Weeks   Status New   PT LONG TERM GOAL #3   Title Pt will improve gait velocity to at least 0.8 ft/sec for improved gait efficiency and safety.   Time 8   Period Weeks   Status New   PT LONG TERM GOAL #4   Title Berg Balance score to be assessed, with pt improving Berg score by at least 8 points.   Time 8   Period Weeks   Status New   PT LONG TERM GOAL #5   Title Pt will ambulate at least 250 ft. using least restrictive assistive device with supervision for improved efficiency and safety with gait.   Time 8   Period Weeks   Additional Long Term Goals   Additional Long Term Goals Yes   PT LONG TERM GOAL #6   Title Pt will verbalize plans for continued community fitness upon D/C from PT.   Time 8   Period Weeks   Status New               Plan - 01/19/15 1728    Clinical Impression Statement Orthotist present to assess/address need for R AFO for R ankle DF assist during gait. Recommending R Reaction AFO, heel wedge for household distances and AFO + R knee cage for community distances. Until R AFO received (~2 weeks) recommending gait at home with daughter using R knee cage, R ASO, R PFRW.   Pt will benefit from skilled therapeutic intervention in order to improve on the following deficits Abnormal gait;Decreased balance;Decreased mobility;Decreased coordination;Decreased strength;Difficulty walking;Impaired sensation;Impaired tone   Rehab Potential Good   PT Frequency 2x / week   PT  Duration 8 weeks  plus eval   PT Treatment/Interventions ADLs/Self Care Home Management;Therapeutic exercise;Therapeutic activities;Functional mobility training;Gait training;Balance training;Neuromuscular re-education;Electrical Stimulation   PT Next Visit Plan Assess compliance with 3 HEP exercises given during last visit, R NMR.  Continue gait training with R AFO.   Consulted and Agree with Plan of Care Patient;Family member/caregiver   Family Member Consulted Daughter, and wife        Problem List Patient Active Problem List   Diagnosis Date Noted  . Anterior cerebral circulation hemorrhagic infarction ([place]) 11/24/2014  . Hemiparesis affecting right side as late effect of cerebrovascular accident ([place]) 09/05/2014  . Depression due to stroke ([place]) 09/05/2014  . Thrombocytopenia ([place]) 06/05/2014  . Chronic diastolic heart failure, NYHA class 1 ([place]) 06/05/2014  . Ex-smoker 06/03/2014  . Obesity 06/03/2014  . Benign paroxysmal positional vertigo 04/28/2014  . Right carotid bruit   . Polyneuropathy, diabetic ([place]) 08/05/2012  . Seasonal allergic rhinitis   . Trigger thumb of left hand 10/12/2011  . Gout   . Adenomatous polyps   . CAD (coronary artery disease)   . Essential hypertension   . Hyperlipidemia   . Type 2 diabetes, uncontrolled, with neuropathy ([place])   . Ejection fraction     Billie Ruddy, PT, DPT [facility] [address] [place], [place], [address] Phone: [phone]   Fax:  [phone] 01/19/2015, 5:31 PM  Name: VANNAK LOJEK MRN: [id number] Date of Birth: 10-10-1940

## 2015-01-19 NOTE — Therapy (Signed)
[facility] [address] [place], [place], [address] Phone: [phone]   Fax:  [phone]  Occupational Therapy Treatment  Patient Details  Name: Jeffrey [name] MRN: [id number] Date of Birth: [birth year] Referring Provider: Dr. Leonie Man  Encounter Date: 01/19/2015      OT End of Session - 01/19/15 1637    Visit Number 2   Number of Visits 17   Date for OT Re-Evaluation 03/13/15   Authorization Type [employer] HMO, no visit limit/no auth, G-code   Authorization - Visit Number 2   Authorization - Number of Visits 10   OT Start Time 1535   OT Stop Time 1616   OT Time Calculation (min) 41 min   Activity Tolerance Patient tolerated treatment well   Behavior During Therapy [facility] for tasks assessed/performed      Past Medical History  Diagnosis Date  . CAD (coronary artery disease)     PCI distal RCA ...2004, residual 70% LAD   /   ...nuclear...03/2007...no ischemia.Marland KitchenMarland Kitchenpreserved LV /  nuclear...03/03/2009...inferior scar..no ischemia..EF 51%  . Dyslipidemia     takes Atorvastatin daily  . Internal hemorrhoids   . Gout     takes Allopurinol daily and Colchicine as needed  . Seasonal allergic rhinitis   . Right carotid bruit   . Cyst of nasopharynx     per ENT Wilburn Cornelia  . Myocardial infarction [facility]) 28yrs ago  . Peripheral edema     takes Lasix daily  . HTN (hypertension)     takes Cardura,Metoprolol,[name],and Amlodipine daily  . Arthritis   . GERD (gastroesophageal reflux disease)     takes Omeprazole daily  . History of colon polyps   . Type 2 diabetes, uncontrolled, with neuropathy ([place]) 1989    takes Invokana daily and has an insulin pump (Dr. [name])  . 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 HCAP (healthcare-associated pneumonia)   . Hemiparesis affecting right side as late effect of cerebrovascular accident ([place]) 09/05/2014  . Anterior cerebral  circulation hemorrhagic infarction [facility]) 11/24/2014    March, 2016, dominant left thalamic and left internal capsule ischemic infarct with resultant hemorrhagic transformation, secondary to small vessel disease. Resulted in right hemiparesis dysarthria and diplopia   //   readmission with aphasia July, 2016, this improved     Past Surgical History  Procedure Laterality Date  . Cataract extraction Bilateral 2010  . Elbow surgery Right 1997  . Balloon angioplasty, artery  1992, 2004    CAD, Dr. Ron Parker  . Colonoscopy  04/03/2002    adenomatous polyp, int hemorrhoids  . Colonoscopy  07/18/2007    normal (Dr. Fuller Plan)  . Rotator cuff repair  09/2011    left, with subacromial decompression  . Nasal septum surgery    . Colonoscopy  09/26/2012    tubular adenoma, sm int hem, rpt 5 yrs Fuller Plan)  . Tonsillectomy and adenoidectomy    . Eye lids raised    . Cardiac catheterization  2004  . Polypectomy N/A 05/27/2013    Procedure: ENDOSCOPIC NASOPHARYNGEAL MASS;  Surgeon: [name], MD    There were no vitals filed for this visit.  Visit Diagnosis:  Hemiparesis affecting right side as late effect of cerebrovascular accident ([place])  Decreased coordination  Stiffness of right shoulder joint  Decreased functional mobility      Subjective Assessment - 01/19/15 1624    Subjective  Pt reports that he has not been doing home  health exercises, because he needed a break, but is ready to start back   Patient is accompained by: Family member  wife   Patient Stated Goals improve use of RUE, drive, be able to feel normal around people   Currently in Pain? No/denies                      OT Treatments/Exercises (OP) - 01/19/15 0001    Neurological Re-education Exercises   Shoulder Flexion AAROM;15 reps;Right;Seated  low range shoulder flex/elbow ext w/UE ranger, min A   Other Exercises 1 Focused on controlled movements to place hand mat>lap and lap>mat with min cues.   Other Grasp and  Release Exercises  Low range functional reaching to grasp/release cylinder objects with min facilitation/min-mod difficulty for control.   Seated with weight on hand with arm on body movements for incr UE stability/control with min facilitation/cues                OT Education - 01/19/15 1626    Education provided Yes   Education Details initial HEP, verbally discussed HEP from home health (recommended against using weights, encouraged pt to resume weightbearing activities)   Person(s) Educated Patient;Spouse   Methods Explanation;Demonstration;Verbal cues;Handout;Tactile cues   Comprehension Verbalized understanding;Returned demonstration;Verbal cues required          OT Short Term Goals - 01/13/15 2050    OT SHORT TERM GOAL #1   Title Pt will be independent with initial HEP.--check STGs 02/10/15   Time 4   Period Weeks   Status New   OT SHORT TERM GOAL #2   Title Pt will use RUE as gross A/stabilizer at least 25% of the time for ADLs.   Time 4   Period Weeks   Status New   OT SHORT TERM GOAL #3   Title Pt will demo at least 55* R shoulder flex with min compensation in prep for functional reach.   Baseline 45*   Time 4   Period Weeks   Status New   OT SHORT TERM GOAL #4   Title Pt will be able to complete clothing fasteners (buttons, tying shoes) mod I using AE prn.   Time 4   Period Weeks   Status New           OT Long Term Goals - 01/13/15 2053    OT LONG TERM GOAL #1   Title Pt will be independent with updated HEP.--check LTGs 03/13/15   Time 8   Period Weeks   Status New   OT LONG TERM GOAL #2   Title Pt will use RUE as gross A/stabilizer for ADLs at least 50% of the time.   Time 8   Period Weeks   Status New   OT LONG TERM GOAL #3   Title Pt will demo at least 65* R shoulder flex with min compensation in prep for functional reach.   Baseline 45*   Time 8   Period Weeks   Status New   OT LONG TERM GOAL #4   Title Pt will improve RUE functional  reaching/coordination for ADLs as shown by improving score on box and blocks test to at least 15 blocks.   Baseline 9 blocks   Time 8   Period Weeks   Status New   OT LONG TERM GOAL #5   Title Pt will perform bathing with supervision.   Time 8   Period Weeks   Status New   Long Term  Additional Goals   Additional Long Term Goals Yes   OT LONG TERM GOAL #6   Title Pt will verbalize understanding of cognitive/visual compensation strategies and activities to improve cognition/vision prn.   Time 8   Period Weeks   Status New               Plan - 01/19/15 1638    Clinical Impression Statement Pt instructed in initial updated HEP as pt reports that he has not been performing home health HEP.  Pt verbalized understanding of HEP.   Plan review HEP, focus on controlled functional movements RUE   Consulted and Agree with Plan of Care Patient;Family member/caregiver   Family Member Consulted wife        Problem List Patient Active Problem List   Diagnosis Date Noted  . Anterior cerebral circulation hemorrhagic infarction ([place]) 11/24/2014  . Hemiparesis affecting right side as late effect of cerebrovascular accident ([place]) 09/05/2014  . Depression due to stroke ([place]) 09/05/2014  . Thrombocytopenia ([place]) 06/05/2014  . Chronic diastolic heart failure, NYHA class 1 ([place]) 06/05/2014  . Ex-smoker 06/03/2014  . Obesity 06/03/2014  . Benign paroxysmal positional vertigo 04/28/2014  . Right carotid bruit   . Polyneuropathy, diabetic ([place]) 08/05/2012  . Seasonal allergic rhinitis   . Trigger thumb of left hand 10/12/2011  . Gout   . Adenomatous polyps   . CAD (coronary artery disease)   . Essential hypertension   . Hyperlipidemia   . Type 2 diabetes, uncontrolled, with neuropathy ([place])   . Ejection fraction     [facility] 01/19/2015, 4:41 PM  [facility] [address] [place], [place], [address] Phone:  [phone]   Fax:  [phone]  Name: Jeffrey [name] MRN: [id number] Date of Birth: 06-May-1940  Vianne Bulls, OTR/L 01/19/2015 4:41 PM

## 2015-01-22 ENCOUNTER — Encounter: Payer: Self-pay | Admitting: Rehabilitation

## 2015-01-22 ENCOUNTER — Ambulatory Visit: Payer: Medicare Other | Admitting: Rehabilitation

## 2015-01-22 ENCOUNTER — Ambulatory Visit: Payer: Medicare Other | Admitting: Occupational Therapy

## 2015-01-22 DIAGNOSIS — R279 Unspecified lack of coordination: Secondary | ICD-10-CM

## 2015-01-22 DIAGNOSIS — R278 Other lack of coordination: Secondary | ICD-10-CM

## 2015-01-22 DIAGNOSIS — I69319 Unspecified symptoms and signs involving cognitive functions following cerebral infarction: Secondary | ICD-10-CM

## 2015-01-22 DIAGNOSIS — R269 Unspecified abnormalities of gait and mobility: Secondary | ICD-10-CM

## 2015-01-22 DIAGNOSIS — M6281 Muscle weakness (generalized): Secondary | ICD-10-CM | POA: Diagnosis not present

## 2015-01-22 DIAGNOSIS — M25611 Stiffness of right shoulder, not elsewhere classified: Secondary | ICD-10-CM

## 2015-01-22 DIAGNOSIS — I69351 Hemiplegia and hemiparesis following cerebral infarction affecting right dominant side: Secondary | ICD-10-CM

## 2015-01-22 DIAGNOSIS — G8191 Hemiplegia, unspecified affecting right dominant side: Secondary | ICD-10-CM

## 2015-01-22 DIAGNOSIS — R2681 Unsteadiness on feet: Secondary | ICD-10-CM

## 2015-01-22 DIAGNOSIS — R2689 Other abnormalities of gait and mobility: Secondary | ICD-10-CM

## 2015-01-22 DIAGNOSIS — H539 Unspecified visual disturbance: Secondary | ICD-10-CM

## 2015-01-22 NOTE — Therapy (Signed)
[facility] [address] [place], [place], [address] Phone: [phone]   Fax:  [phone]  Physical Therapy Treatment  Patient Details  Name: Jeffrey [name] MRN: [id number] Date of Birth: 17-Mar-1940 Referring Provider: Leonie Man  Encounter Date: 01/22/2015      PT End of Session - 01/22/15 1310    Visit Number 5   Number of Visits 17   Date for PT Re-Evaluation 03/08/15   Authorization Type Blue Medicare HMO-G- code every 10th visit   PT Start Time 1315   PT Stop Time 1401   PT Time Calculation (min) 46 min   Activity Tolerance Patient tolerated treatment well   Behavior During Therapy [facility] for tasks assessed/performed      Past Medical History  Diagnosis Date  . CAD (coronary artery disease)     PCI distal RCA ...2004, residual 70% LAD   /   ...nuclear...03/2007...no ischemia.Marland KitchenMarland Kitchenpreserved LV /  nuclear...03/03/2009...inferior scar..no ischemia..EF 51%  . Dyslipidemia     takes Atorvastatin daily  . Internal hemorrhoids   . Gout     takes Allopurinol daily and Colchicine as needed  . Seasonal allergic rhinitis   . Right carotid bruit   . Cyst of nasopharynx     per ENT Wilburn Cornelia  . Myocardial infarction [facility]) 20yrs ago  . Peripheral edema     takes Lasix daily  . HTN (hypertension)     takes Cardura,Metoprolol,[name],and Amlodipine daily  . Arthritis   . GERD (gastroesophageal reflux disease)     takes Omeprazole daily  . History of colon polyps   . Type 2 diabetes, uncontrolled, with neuropathy ([place]) 1989    takes Invokana daily and has an insulin pump (Dr. [name])  . 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 HCAP (healthcare-associated pneumonia)   . Hemiparesis affecting right side as late effect of cerebrovascular accident ([place]) 09/05/2014  . Anterior cerebral circulation hemorrhagic infarction [facility]) 11/24/2014    March, 2016, dominant left thalamic and  left internal capsule ischemic infarct with resultant hemorrhagic transformation, secondary to small vessel disease. Resulted in right hemiparesis dysarthria and diplopia   //   readmission with aphasia July, 2016, this improved     Past Surgical History  Procedure Laterality Date  . Cataract extraction Bilateral 2010  . Elbow surgery Right 1997  . Balloon angioplasty, artery  1992, 2004    CAD, Dr. Ron Parker  . Colonoscopy  04/03/2002    adenomatous polyp, int hemorrhoids  . Colonoscopy  07/18/2007    normal (Dr. Fuller Plan)  . Rotator cuff repair  09/2011    left, with subacromial decompression  . Nasal septum surgery    . Colonoscopy  09/26/2012    tubular adenoma, sm int hem, rpt 5 yrs Fuller Plan)  . Tonsillectomy and adenoidectomy    . Eye lids raised    . Cardiac catheterization  2004  . Polypectomy N/A 05/27/2013    Procedure: ENDOSCOPIC NASOPHARYNGEAL MASS;  Surgeon: [name], MD    There were no vitals filed for this visit.  Visit Diagnosis:  Abnormality of gait  Unsteadiness  Decreased coordination  Hemiparesis affecting right side as late effect of cerebrovascular accident [facility])      Subjective Assessment - 01/22/15 1317    Subjective Reports no changes since last visit.  No falls    Patient is accompained by: Family member   Patient Stated Goals Pt's goal for therapy is to avoid losing  the strength he has regained since March.  He would like to be mobile without walker.   Currently in Pain? No/denies         Therex: Went over 3 exercises currently on HEP as follows; supine BLE bridging with hip adduction x 10 reps, sit<>stand without UE support x 10 reps with cues for slower motion and more controlled descent.  Also performed standing marching x 10 reps each LE with cues for upright posture.        NMR:  Addressed gait without use of AD in order to increase weight shift and weight bearing on RLE.   Continue to note that R steps tend to be very ataxic and  placement becomes difficult esp with increased fatigue.  Performed 31' in this manner therefore allowed to sit and rest while PT donned 3lb weight on R ankle for increased proprioceptive feedback.  Note slight improvement, however with increased fatigue, steps still continue to be sparadic.  Then progressed to tall kneeling tasks for increased WB and proprioceptive input through RLE.  Performed mini squats in tall kneeling x 10 reps progressing to forward R lateral reaching task with PT assisting with WB through RUE (at elbow) and at R glute for increased hip protraction.  Tolerated well.                   PT Education - 01/22/15 1310    Education provided Yes   Education Details HEP compliance, sit<>stand without UE use or L hand in lap for increased LE use.    Person(s) Educated Patient;Spouse;Child(ren)   Methods Explanation   Comprehension Verbalized understanding          PT Short Term Goals - 01/08/15 1303    PT SHORT TERM GOAL #1   Title Pt will perform HEP with family assistance/supervision, for lower extremitiy strengthening.  TARGET 02/06/15   Time 4   Period Weeks   Status New   PT SHORT TERM GOAL #2   Title Pt will perform sit<>stand transfers with supervision, for improved efficiency and safety with transfers.   Time 4   Period Weeks   Status New   PT SHORT TERM GOAL #3   Title Pt will improve TUG score to less than or equal to 70 seconds for decreased fall risk.   Time 4   Period Weeks   Status New   PT SHORT TERM GOAL #4   Title Pt will ambulate at least 150 ft using least restrictive assistive device with minimal assistance for improved gait efficiency.   Time 4   Period Weeks   Status New   PT SHORT TERM GOAL #5   Title Pt will verbalize understanding of CVA education.   Time 4   Period Weeks   Status New           PT Long Term Goals - 01/08/15 1311    PT LONG TERM GOAL #1   Title Pt will verbalize understanding of fall prevention within  home environment.  TARGET 03/08/15   Time 8   Period Weeks   Status New   PT LONG TERM GOAL #2   Title Pt will improve Timed Up and GO score to less than or equal to 50 seconds for decreased fall risk.   Time 8   Period Weeks   Status New   PT LONG TERM GOAL #3   Title Pt will improve gait velocity to at least 0.8 ft/sec for improved gait efficiency and  safety.   Time 8   Period Weeks   Status New   PT LONG TERM GOAL #4   Title Berg Balance score to be assessed, with pt improving Berg score by at least 8 points.   Time 8   Period Weeks   Status New   PT LONG TERM GOAL #5   Title Pt will ambulate at least 250 ft. using least restrictive assistive device with supervision for improved efficiency and safety with gait.   Time 8   Period Weeks   Additional Long Term Goals   Additional Long Term Goals Yes   PT LONG TERM GOAL #6   Title Pt will verbalize plans for continued community fitness upon D/C from PT.   Time 8   Period Weeks   Status New               Plan - 01/22/15 1310    Clinical Impression Statement Skilled session focused on assessment of compliance and performance of HEP, gait without AD for increased NMR through RLE as well as tall kneeling tasks for increased weight shift and WB through RUE/LE.  Tolerated well.    Pt will benefit from skilled therapeutic intervention in order to improve on the following deficits Abnormal gait;Decreased balance;Decreased mobility;Decreased coordination;Decreased strength;Difficulty walking;Impaired sensation;Impaired tone   Rehab Potential Good   PT Frequency 2x / week   PT Duration 8 weeks  plus eval   PT Treatment/Interventions ADLs/Self Care Home Management;Therapeutic exercise;Therapeutic activities;Functional mobility training;Gait training;Balance training;Neuromuscular re-education;Electrical Stimulation   PT Next Visit Plan R NMR.  Continue gait training with R AFO with and without R PFRW   Consulted and Agree with Plan  of Care Patient;Family member/caregiver   Family Member Consulted Daughter, and wife        Problem List Patient Active Problem List   Diagnosis Date Noted  . Anterior cerebral circulation hemorrhagic infarction ([place]) 11/24/2014  . Hemiparesis affecting right side as late effect of cerebrovascular accident ([place]) 09/05/2014  . Depression due to stroke ([place]) 09/05/2014  . Thrombocytopenia ([place]) 06/05/2014  . Chronic diastolic heart failure, NYHA class 1 ([place]) 06/05/2014  . Ex-smoker 06/03/2014  . Obesity 06/03/2014  . Benign paroxysmal positional vertigo 04/28/2014  . Right carotid bruit   . Polyneuropathy, diabetic ([place]) 08/05/2012  . Seasonal allergic rhinitis   . Trigger thumb of left hand 10/12/2011  . Gout   . Adenomatous polyps   . CAD (coronary artery disease)   . Essential hypertension   . Hyperlipidemia   . Type 2 diabetes, uncontrolled, with neuropathy ([place])   . Ejection fraction     Cameron Sprang, PT, MPT [facility] [address] [place], [place], [address] Phone: [phone]   Fax:  [phone] 01/22/2015, 4:49 PM  Name: Jeffrey [name] MRN: [id number] Date of Birth: 20-Apr-1940

## 2015-01-22 NOTE — Therapy (Signed)
[facility] [address] [place], [place], [address] Phone: [phone]   Fax:  [phone]  Occupational Therapy Treatment  Patient Details  Name: Jeffrey [name] MRN: [id number] Date of Birth: 11-14-1940 Referring Provider: Dr. Leonie Man  Encounter Date: 01/22/2015      OT End of Session - 01/22/15 1728    Visit Number 3   Number of Visits 17   Date for OT Re-Evaluation 03/13/15   Authorization Type Blue Medicare HMO, no visit limit/no auth, G-code   Authorization - Visit Number 3   Authorization - Number of Visits 10   OT Start Time 1407   OT Stop Time 1445   OT Time Calculation (min) 38 min   Activity Tolerance Patient tolerated treatment well   Behavior During Therapy [facility] for tasks assessed/performed      Past Medical History  Diagnosis Date  . CAD (coronary artery disease)     PCI distal RCA ...2004, residual 70% LAD   /   ...nuclear...03/2007...no ischemia.Marland KitchenMarland Kitchenpreserved LV /  nuclear...03/03/2009...inferior scar..no ischemia..EF 51%  . Dyslipidemia     takes Atorvastatin daily  . Internal hemorrhoids   . Gout     takes Allopurinol daily and Colchicine as needed  . Seasonal allergic rhinitis   . Right carotid bruit   . Cyst of nasopharynx     per ENT Wilburn Cornelia  . Myocardial infarction [facility]) 55yrs ago  . Peripheral edema     takes Lasix daily  . HTN (hypertension)     takes Cardura,Metoprolol,[name],and Amlodipine daily  . Arthritis   . GERD (gastroesophageal reflux disease)     takes Omeprazole daily  . History of colon polyps   . Type 2 diabetes, uncontrolled, with neuropathy ([place]) 1989    takes Invokana daily and has an insulin pump (Dr. [name])  . 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 HCAP (healthcare-associated pneumonia)   . Hemiparesis affecting right side as late effect of cerebrovascular accident ([place]) 09/05/2014  . Anterior cerebral  circulation hemorrhagic infarction [facility]) 11/24/2014    March, 2016, dominant left thalamic and left internal capsule ischemic infarct with resultant hemorrhagic transformation, secondary to small vessel disease. Resulted in right hemiparesis dysarthria and diplopia   //   readmission with aphasia July, 2016, this improved     Past Surgical History  Procedure Laterality Date  . Cataract extraction Bilateral 2010  . Elbow surgery Right 1997  . Balloon angioplasty, artery  1992, 2004    CAD, Dr. Ron Parker  . Colonoscopy  04/03/2002    adenomatous polyp, int hemorrhoids  . Colonoscopy  07/18/2007    normal (Dr. Fuller Plan)  . Rotator cuff repair  09/2011    left, with subacromial decompression  . Nasal septum surgery    . Colonoscopy  09/26/2012    tubular adenoma, sm int hem, rpt 5 yrs Fuller Plan)  . Tonsillectomy and adenoidectomy    . Eye lids raised    . Cardiac catheterization  2004  . Polypectomy N/A 05/27/2013    Procedure: ENDOSCOPIC NASOPHARYNGEAL MASS;  Surgeon: [name], MD    There were no vitals filed for this visit.  Visit Diagnosis:  Right hemiparesis (HCC)  Decreased coordination  Stiffness of right shoulder joint  Decreased functional mobility  Cognitive deficits following cerebral infarction  Visual disturbance      Subjective Assessment - 01/22/15 1726    Subjective  Pt reports he was a little sore since  he had not been doing his HEP, but that he has been doing since last session and that he is not sore today.   Patient is accompained by: Family member   Patient Stated Goals improve use of RUE, drive, be able to feel normal around people   Currently in Pain? No/denies                      OT Treatments/Exercises (OP) - 01/22/15 0001    ADLs   Eating Practiced grasping cup and bringing to mouth with RUE and then BUEs.  Pt with min-mod decr coordintion with RUE and min decr control using BUEs together.   Functional Mobility transfer mat>w/c with LOB  and min A to correct with RLE buckling.   Neurological Re-education Exercises   Other Exercises 1 In stranding, wt bearing through RUE on table while using LUE to copy small peg design for neuro re-ed (body on arm movements with min facilitation/CGA), cognitive/visual challenge with min-mod cueing to copy design accurately with conversation for alternating/divided attention with difficulty.   Other Grasp and Release Exercises  In sitting, low range functional reaching to remove cylinder pegs and place in bowl with min A for control and mod cueing for shoulder hike/compensation.                OT Education - 01/22/15 1726    Education Details Reviewed HEP given last session, instructed pt to use hand over hand technique for bringing empty cup to mouth and bathing   Person(s) Educated Patient   Methods Explanation   Comprehension Verbalized understanding;Returned demonstration          OT Short Term Goals - 01/13/15 2050    OT [place] #1   Title Pt will be independent with initial HEP.--check STGs 02/10/15   Time 4   Period Weeks   Status New   OT SHORT TERM GOAL #2   Title Pt will use RUE as gross A/stabilizer at least 25% of the time for ADLs.   Time 4   Period Weeks   Status New   OT SHORT TERM GOAL #3   Title Pt will demo at least 55* R shoulder flex with min compensation in prep for functional reach.   Baseline 45*   Time 4   Period Weeks   Status New   OT SHORT TERM GOAL #4   Title Pt will be able to complete clothing fasteners (buttons, tying shoes) mod I using AE prn.   Time 4   Period Weeks   Status New           OT Long Term Goals - 01/13/15 2053    OT LONG TERM GOAL #1   Title Pt will be independent with updated HEP.--check LTGs 03/13/15   Time 8   Period Weeks   Status New   OT LONG TERM GOAL #2   Title Pt will use RUE as gross A/stabilizer for ADLs at least 50% of the time.   Time 8   Period Weeks   Status New   OT LONG TERM GOAL #3    Title Pt will demo at least 65* R shoulder flex with min compensation in prep for functional reach.   Baseline 45*   Time 8   Period Weeks   Status New   OT LONG TERM GOAL #4   Title Pt will improve RUE functional reaching/coordination for ADLs as shown by improving score on box and  blocks test to at least 15 blocks.   Baseline 9 blocks   Time 8   Period Weeks   Status New   OT LONG TERM GOAL #5   Title Pt will perform bathing with supervision.   Time 8   Period Weeks   Status New   Long Term Additional Goals   Additional Long Term Goals Yes   OT LONG TERM GOAL #6   Title Pt will verbalize understanding of cognitive/visual compensation strategies and activities to improve cognition/vision prn.   Time 8   Period Weeks   Status New               Plan - 01/22/15 1728    Clinical Impression Statement Pt progressing towards goals, but ataxia limits RUE functional use   Plan neuro re-ed, RUE functional use   Consulted and Agree with Plan of Care Patient;Family member/caregiver   Family Member Consulted wife        Problem List Patient Active Problem List   Diagnosis Date Noted  . Anterior cerebral circulation hemorrhagic infarction ([place]) 11/24/2014  . Hemiparesis affecting right side as late effect of cerebrovascular accident ([place]) 09/05/2014  . Depression due to stroke ([place]) 09/05/2014  . Thrombocytopenia ([place]) 06/05/2014  . Chronic diastolic heart failure, NYHA class 1 ([place]) 06/05/2014  . Ex-smoker 06/03/2014  . Obesity 06/03/2014  . Benign paroxysmal positional vertigo 04/28/2014  . Right carotid bruit   . Polyneuropathy, diabetic ([place]) 08/05/2012  . Seasonal allergic rhinitis   . Trigger thumb of left hand 10/12/2011  . Gout   . Adenomatous polyps   . CAD (coronary artery disease)   . Essential hypertension   . Hyperlipidemia   . Type 2 diabetes, uncontrolled, with neuropathy ([place])   . Ejection fraction     [facility] 01/22/2015, 5:34 PM  [facility] [address] [place], [place], [address] Phone: [phone]   Fax:  [phone]  Name: Jeffrey [name] MRN: [id number] Date of Birth: 1940-05-30  Vianne Bulls, OTR/L 01/22/2015 5:35 PM

## 2015-01-27 ENCOUNTER — Ambulatory Visit: Payer: Medicare Other | Admitting: Physical Therapy

## 2015-01-27 ENCOUNTER — Encounter: Payer: Self-pay | Admitting: Occupational Therapy

## 2015-01-27 ENCOUNTER — Ambulatory Visit: Payer: Medicare Other | Admitting: Occupational Therapy

## 2015-01-27 DIAGNOSIS — M25611 Stiffness of right shoulder, not elsewhere classified: Secondary | ICD-10-CM

## 2015-01-27 DIAGNOSIS — R278 Other lack of coordination: Secondary | ICD-10-CM

## 2015-01-27 DIAGNOSIS — R2681 Unsteadiness on feet: Secondary | ICD-10-CM

## 2015-01-27 DIAGNOSIS — R2689 Other abnormalities of gait and mobility: Secondary | ICD-10-CM

## 2015-01-27 DIAGNOSIS — G8191 Hemiplegia, unspecified affecting right dominant side: Secondary | ICD-10-CM

## 2015-01-27 DIAGNOSIS — R279 Unspecified lack of coordination: Principal | ICD-10-CM

## 2015-01-27 DIAGNOSIS — M6281 Muscle weakness (generalized): Secondary | ICD-10-CM | POA: Diagnosis not present

## 2015-01-27 DIAGNOSIS — R269 Unspecified abnormalities of gait and mobility: Secondary | ICD-10-CM

## 2015-01-27 NOTE — Therapy (Signed)
[facility] [address] [place], [place], [address] Phone: [phone]   Fax:  [phone]  Physical Therapy Treatment  Patient Details  Name: Jeffrey [name] MRN: [id number] Date of Birth: 03-21-1940 Referring Provider: Leonie Man  Encounter Date: 01/27/2015      PT End of Session - 01/27/15 1135    Visit Number 6   Number of Visits 17   Date for PT Re-Evaluation 03/08/15   Authorization Type Blue Medicare HMO-G- code every 10th visit   PT Start Time 1020   PT Stop Time 1102   PT Time Calculation (min) 42 min   Activity Tolerance Patient tolerated treatment well   Behavior During Therapy [facility] for tasks assessed/performed      Past Medical History  Diagnosis Date  . CAD (coronary artery disease)     PCI distal RCA ...2004, residual 70% LAD   /   ...nuclear...03/2007...no ischemia.Marland KitchenMarland Kitchenpreserved LV /  nuclear...03/03/2009...inferior scar..no ischemia..EF 51%  . Dyslipidemia     takes Atorvastatin daily  . Internal hemorrhoids   . Gout     takes Allopurinol daily and Colchicine as needed  . Seasonal allergic rhinitis   . Right carotid bruit   . Cyst of nasopharynx     per ENT Wilburn Cornelia  . Myocardial infarction [facility]) 44yr ago  . Peripheral edema     takes Lasix daily  . HTN (hypertension)     takes Cardura,Metoprolol,[name],and Amlodipine daily  . Arthritis   . GERD (gastroesophageal reflux disease)     takes Omeprazole daily  . History of colon polyps   . Type 2 diabetes, uncontrolled, [place] 1989    takes Invokana daily and has an insulin pump ([name]  . 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 HCAP (healthcare-associated pneumonia)   . Hemipares[place] affecting right side [place] 09/05[place]  . Anterior cerebral circulation hemorrhagic infarction ([facility] 11/24/2014    March, 2016, dominant left thalamic and  left internal capsule ischemic infarct with resultant hemorrhagic transformation, secondary to small vessel disease. Resulted in right hemiparesis dysarthria and diplopia   //   readmission with aphasia July, 2016, this improved     Past Surgical History  Procedure Laterality Date  . Cataract extraction Bilateral 2010  . Elbow surgery Right 1997  . Balloon angioplasty, artery  1992, 2004    CAD, Dr. KRon Parker . Colonoscopy  04/03/2002    adenomatous polyp, int hemorrhoids  . Colonoscopy  07/18/2007    normal (Dr. SFuller Plan  . Rotator cuff repair  09/2011    left, with subacromial decompression  . Nasal septum surgery    . Colonoscopy  09/26/2012    tubular adenoma, sm int hem, rpt 5 yrs (Fuller Plan  . Tonsillectomy and adenoidectomy    . Eye lids raised    . Cardiac catheterization  2004  . Polypectomy N/A 05/27/2013    Procedure: ENDOSCOPIC NASOPHARYNGEAL MASS;  Surgeon: DJerrell Belfast MD    There were no vitals filed for this visit.  Visit Diagnosis:  Abnormality of gait  Unsteadiness  Right hemiparesis (HCC)      Subjective Assessment - 01/27/15 1021    Subjective Pt reports no significant changes, no falls. Pt reports he has been doing "very little" walking at home with assistance of daughter.   Patient is accompained by: Family member   Patient Stated Goals Pt's goal for therapy is to avoid  losing the strength he has regained since March.  He would like to be mobile without walker.   Currently in Pain? No/denies                         [facility] Adult PT Treatment/Exercise - 01/27/15 0001    Transfers   Transfers Sit to Stand;Stand to Sit   Sit to Stand 5: Supervision   Sit to Stand Details (indicate cue type and reason) To RW   Stand to Sit 5: Supervision   Stand to Sit Details with RW   Ambulation/Gait   Ambulation/Gait Yes   Ambulation/Gait Assistance 4: Min guard;4: Min assist;5: Supervision   Ambulation/Gait Assistance Details Gait x160' consecutively with  RW (no platform), pt required intermittent manual stabilization of R hand on RW; utilized mirror during initial 30' of gait trial (positioned to pt's R side) for visual feedback of R genu recurvatum. After mirror used for feedback, pt able to control R GR for >90% of remaining distance of gait trial.   Ambulation Distance (Feet) 160 Feet   Assistive device Rolling walker  R AFO (Reaction) and heel wedge   Gait Pattern Step-through pattern;Decreased stance time - right;Decreased step length - left;Decreased weight shift to right;Right genu recurvatum;Ataxic;Abducted- right   Ambulation Surface Level;Indoor   Ramp 3: Mod assist;4: Min assist  Mod A to ascend; min A to descend with RW, R AFO   Ramp Details (indicate cue type and reason) During ascent, required cueing to decrease RLE step length (to prevent R GR), manual stabilization of R hand on RW, and tactile cueing at distal R hamstrings to control R TKE. During descent, required verbal instruction for hand placement on RW to slow descent for increased safety.   Neuro Re-ed    Neuro Re-ed Details  Quadruped performed to increase sensory/proprioceptive input to address truncal, RUE/RLE ataxia. Dynamic quadruped x7 minutes while pt utilizing LUE to draw on mirror positioned anterior to pt as pt engaged in tic-tac-toe; PT provided manual stabilization of RUE, tactile cueing at R hand for RUE WB, tactle cueing to maintain RUE WB due to pt tendency to maintain weight over BLE's. Tall kneeling x4 minutes for RLE proproprioceptive input, proximal stability/control, to address truncal/RLE ataxia. In tall kneeling with intermittent L finger tip support, performed LLE forward/retro stepping x5, alternating B reciprocal retro stepping x6 with multimodal cueing for full lateral weight shift and lifting/lowering respective LE (as opposed to pt tendency to avoid wt shifting and perform multiple small steps). Tall kneeling activity ended due to pt fatigue.  increased  time required to get into/out of positions                  PT Short Term Goals - 01/27/15 1139    PT SHORT TERM GOAL #1   Title Pt will perform HEP with family assistance/supervision, for lower extremitiy strengthening.  TARGET 02/06/15   Time 4   Period Weeks   Status New   PT SHORT TERM GOAL #2   Title Pt will perform sit<>stand transfers with supervision, for improved efficiency and safety with transfers.   Baseline Met 11/22.   Time 4   Period Weeks   Status Achieved   PT SHORT TERM GOAL #3   Title Pt will improve TUG score to less than or equal to 70 seconds for decreased fall risk.   Time 4   Period Weeks   Status New   PT SHORT TERM GOAL #  4   Title Pt will ambulate at least 150 ft using least restrictive assistive device with minimal assistance for improved gait efficiency.   Baseline Met 11/22.   Time 4   Period Weeks   Status Achieved   PT SHORT TERM GOAL #5   Title Pt will verbalize understanding of CVA education.   Time 4   Period Weeks   Status New           PT Long Term Goals - 01/08/15 1311    PT LONG TERM GOAL #1   Title Pt will verbalize understanding of fall prevention within home environment.  TARGET 03/08/15   Time 8   Period Weeks   Status New   PT LONG TERM GOAL #2   Title Pt will improve Timed Up and GO score to less than or equal to 50 seconds for decreased fall risk.   Time 8   Period Weeks   Status New   PT LONG TERM GOAL #3   Title Pt will improve gait velocity to at least 0.8 ft/sec for improved gait efficiency and safety.   Time 8   Period Weeks   Status New   PT LONG TERM GOAL #4   Title Berg Balance score to be assessed, with pt improving Berg score by at least 8 points.   Time 8   Period Weeks   Status New   PT LONG TERM GOAL #5   Title Pt will ambulate at least 250 ft. using least restrictive assistive device with supervision for improved efficiency and safety with gait.   Time 8   Period Weeks   Additional Long  Term Goals   Additional Long Term Goals Yes   PT LONG TERM GOAL #6   Title Pt will verbalize plans for continued community fitness upon D/C from PT.   Time 8   Period Weeks   Status New               Plan - 01/27/15 1136    Clinical Impression Statement Session focused on increasing proximal stability/control using WB positions (quadruped, tall kneeling) for increased proprioceptive input. Pt ambulated x160' with RW (no platfrom), R AFO, and R heel wedge with supervison to (intermittent) min A to stabilize R hand on RW. Noted within-session improvement in R knee control with use of mirror for visual feedback.  Met STG's 2 and 4 for sit <> stand wth supervision and gait x150' with min A using LRAD.   Pt will benefit from skilled therapeutic intervention in order to improve on the following deficits Abnormal gait;Decreased balance;Decreased mobility;Decreased coordination;Decreased strength;Difficulty walking;Impaired sensation;Impaired tone   Rehab Potential Good   PT Frequency 2x / week   PT Duration 8 weeks  plus eval   PT Treatment/Interventions ADLs/Self Care Home Management;Therapeutic exercise;Therapeutic activities;Functional mobility training;Gait training;Balance training;Neuromuscular re-education;Electrical Stimulation   PT Next Visit Plan R NMR.  Continue gait training with R AFO with and without R PFRW   Consulted and Agree with Plan of Care Patient;Family member/caregiver   Family Member Consulted Daughter, and wife        Problem List Patient Active Problem List   Diagnosis Date Noted  . Anterior cerebral circulation hemorrhagic infarction ([place]) 11/24/2014  . Hemiparesis affecting right side as late effect of cerebrovascular accident ([place]) 09/05/2014  . Depression due to stroke ([place]) 09/05/2014  . Thrombocytopenia ([place]) 06/05/2014  . Chronic diastolic heart failure, NYHA class 1 ([place]) 06/05/2014  . Ex-smoker 06/03/2014  . Obesity  06/03/2014  . Benign  paroxysmal positional vertigo 04/28/2014  . Right carotid bruit   . Polyneuropathy, diabetic (HCC) 08/05/2012  . Seasonal allergic rhinitis   . Trigger thumb of left hand 10/12/2011  . Gout   . Adenomatous polyps   . CAD (coronary artery disease)   . Essential hypertension   . Hyperlipidemia   . Type 2 diabetes, uncontrolled, with neuropathy (HCC)   . Ejection fraction     Blair Hobble, PT, DPT [facility] [address] [place], [place], [address] Phone: [phone]   Fax:  [phone] 01/27/2015, 11:44 AM   Name: Jeffrey [name] MRN: [id number] Date of Birth: 04/06/1940     

## 2015-01-27 NOTE — Therapy (Signed)
[facility] [address] [place], [place], [address] Phone: [phone]   Fax:  [phone]  Occupational Therapy Treatment  Patient Details  Name: Jeffrey [name] MRN: [id number] Date of Birth: 07/01/1940 Referring Provider: Dr. Leonie Man  Encounter Date: 01/27/2015      OT End of Session - 01/27/15 1016    Visit Number 4   Number of Visits 17   Date for OT Re-Evaluation 03/13/15   Authorization Type [employer] HMO, no visit limit/no auth, G-code   Authorization - Visit Number 4   Authorization - Number of Visits 10   OT Start Time [phone]   OT Stop Time 1019   OT Time Calculation (min) 43 min   Activity Tolerance Patient tolerated treatment well   Behavior During Therapy [facility] for tasks assessed/performed      Past Medical History  Diagnosis Date  . CAD (coronary artery disease)     PCI distal RCA ...2004, residual 70% LAD   /   ...nuclear...03/2007...no ischemia.Marland KitchenMarland Kitchenpreserved LV /  nuclear...03/03/2009...inferior scar..no ischemia..EF 51%  . Dyslipidemia     takes Atorvastatin daily  . Internal hemorrhoids   . Gout     takes Allopurinol daily and Colchicine as needed  . Seasonal allergic rhinitis   . Right carotid bruit   . Cyst of nasopharynx     per ENT Wilburn Cornelia  . Myocardial infarction [facility]) 62yrs ago  . Peripheral edema     takes Lasix daily  . HTN (hypertension)     takes Cardura,Metoprolol,[name],and Amlodipine daily  . Arthritis   . GERD (gastroesophageal reflux disease)     takes Omeprazole daily  . History of colon polyps   . Type 2 diabetes, uncontrolled, with neuropathy ([place]) 1989    takes Invokana daily and has an insulin pump (Dr. [name])  . 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 HCAP (healthcare-associated pneumonia)   . Hemiparesis affecting right side as late effect of cerebrovascular accident ([place]) 09/05/2014  . Anterior cerebral  circulation hemorrhagic infarction [facility]) 11/24/2014    March, 2016, dominant left thalamic and left internal capsule ischemic infarct with resultant hemorrhagic transformation, secondary to small vessel disease. Resulted in right hemiparesis dysarthria and diplopia   //   readmission with aphasia July, 2016, this improved     Past Surgical History  Procedure Laterality Date  . Cataract extraction Bilateral 2010  . Elbow surgery Right 1997  . Balloon angioplasty, artery  1992, 2004    CAD, Dr. Ron Parker  . Colonoscopy  04/03/2002    adenomatous polyp, int hemorrhoids  . Colonoscopy  07/18/2007    normal (Dr. Fuller Plan)  . Rotator cuff repair  09/2011    left, with subacromial decompression  . Nasal septum surgery    . Colonoscopy  09/26/2012    tubular adenoma, sm int hem, rpt 5 yrs Fuller Plan)  . Tonsillectomy and adenoidectomy    . Eye lids raised    . Cardiac catheterization  2004  . Polypectomy N/A 05/27/2013    Procedure: ENDOSCOPIC NASOPHARYNGEAL MASS;  Surgeon: [name], MD    There were no vitals filed for this visit.  Visit Diagnosis:  Decreased coordination  Right hemiparesis ([place])  Stiffness of right shoulder joint  Decreased functional mobility      Subjective Assessment - 01/27/15 1015    Subjective  control seems a little better today   Patient is accompained by: Family member   Patient  Stated Goals improve use of RUE, drive, be able to feel normal around people   Currently in Pain? No/denies                      OT Treatments/Exercises (OP) - 01/27/15 1733    Neurological Re-education Exercises   Shoulder Flexion AAROM;Both;Supine;Seated  with min facilitation, cues in sitting for compensation   Elbow Extension AAROM;Both;Supine;AROM  chest press/AROM with min facilitation   Other Exercises 1 Functional transfers during session with supervision.   Other Exercises 2 UE ranger for elbow ext/flex control with min facilitation for control   Other  Grasp and Release Exercises  In sitting, low range functional reaching to grasp/release cylinder objects with min facilitation for control    Reciprocal Movements Arm bike x29min level 1 with R hand wrapped for reciprocal movments with good control.                  OT Short Term Goals - 01/13/15 2050    OT SHORT TERM GOAL #1   Title Pt will be independent with initial HEP.--check STGs 02/10/15   Time 4   Period Weeks   Status New   OT SHORT TERM GOAL #2   Title Pt will use RUE as gross A/stabilizer at least 25% of the time for ADLs.   Time 4   Period Weeks   Status New   OT SHORT TERM GOAL #3   Title Pt will demo at least 55* R shoulder flex with min compensation in prep for functional reach.   Baseline 45*   Time 4   Period Weeks   Status New   OT SHORT TERM GOAL #4   Title Pt will be able to complete clothing fasteners (buttons, tying shoes) mod I using AE prn.   Time 4   Period Weeks   Status New           OT Long Term Goals - 01/13/15 2053    OT LONG TERM GOAL #1   Title Pt will be independent with updated HEP.--check LTGs 03/13/15   Time 8   Period Weeks   Status New   OT LONG TERM GOAL #2   Title Pt will use RUE as gross A/stabilizer for ADLs at least 50% of the time.   Time 8   Period Weeks   Status New   OT LONG TERM GOAL #3   Title Pt will demo at least 65* R shoulder flex with min compensation in prep for functional reach.   Baseline 45*   Time 8   Period Weeks   Status New   OT LONG TERM GOAL #4   Title Pt will improve RUE functional reaching/coordination for ADLs as shown by improving score on box and blocks test to at least 15 blocks.   Baseline 9 blocks   Time 8   Period Weeks   Status New   OT LONG TERM GOAL #5   Title Pt will perform bathing with supervision.   Time 8   Period Weeks   Status New   Long Term Additional Goals   Additional Long Term Goals Yes   OT LONG TERM GOAL #6   Title Pt will verbalize understanding of  cognitive/visual compensation strategies and activities to improve cognition/vision prn.   Time 8   Period Weeks   Status New               Plan - 01/27/15 1017  Clinical Impression Statement Pt demo incr control with AAROM today, but ataxia continues to limit RUE functional use/open chain movements   Plan neuro re-ed, RUE functional use   Consulted and Agree with Plan of Care Patient        Problem List Patient Active Problem List   Diagnosis Date Noted  . Anterior cerebral circulation hemorrhagic infarction ([place]) 11/24/2014  . Hemiparesis affecting right side as late effect of cerebrovascular accident ([place]) 09/05/2014  . Depression due to stroke ([place]) 09/05/2014  . Thrombocytopenia ([place]) 06/05/2014  . Chronic diastolic heart failure, NYHA class 1 ([place]) 06/05/2014  . Ex-smoker 06/03/2014  . Obesity 06/03/2014  . Benign paroxysmal positional vertigo 04/28/2014  . Right carotid bruit   . Polyneuropathy, diabetic ([place]) 08/05/2012  . Seasonal allergic rhinitis   . Trigger thumb of left hand 10/12/2011  . Gout   . Adenomatous polyps   . CAD (coronary artery disease)   . Essential hypertension   . Hyperlipidemia   . Type 2 diabetes, uncontrolled, with neuropathy ([place])   . Ejection fraction     [facility] 01/27/2015, 5:37 PM  [facility] [address] [place], [place], [address] Phone: [phone]   Fax:  [phone]  Name: Jeffrey [name] MRN: [id number] Date of Birth: [birth year]  Vianne Bulls, OTR/L 01/27/2015 5:37 PM

## 2015-01-28 ENCOUNTER — Ambulatory Visit: Payer: Medicare Other | Admitting: Podiatry

## 2015-01-28 ENCOUNTER — Encounter: Payer: Self-pay | Admitting: Podiatry

## 2015-01-28 ENCOUNTER — Ambulatory Visit (INDEPENDENT_AMBULATORY_CARE_PROVIDER_SITE_OTHER): Payer: Medicare Other | Admitting: Podiatry

## 2015-01-28 DIAGNOSIS — M79674 Pain in right toe(s): Secondary | ICD-10-CM

## 2015-01-28 DIAGNOSIS — M79675 Pain in left toe(s): Secondary | ICD-10-CM | POA: Diagnosis not present

## 2015-01-28 DIAGNOSIS — B351 Tinea unguium: Secondary | ICD-10-CM

## 2015-01-29 NOTE — Patient Instructions (Signed)
Diabetes and Foot Care Diabetes may cause you to have problems because of poor blood supply (circulation) to your feet and legs. This may cause the skin on your feet to become thinner, break easier, and heal more slowly. Your skin may become dry, and the skin may peel and crack. You may also have nerve damage in your legs and feet causing decreased feeling in them. You may not notice minor injuries to your feet that could lead to infections or more serious problems. Taking care of your feet is one of the most important things you can do for yourself.  HOME CARE INSTRUCTIONS  Wear shoes at all times, even in the house. Do not go barefoot. Bare feet are easily injured.  Check your feet daily for blisters, cuts, and redness. If you cannot see the bottom of your feet, use a mirror or ask someone for help.  Wash your feet with warm water (do not use hot water) and mild soap. Then pat your feet and the areas between your toes until they are completely dry. Do not soak your feet as this can dry your skin.  Apply a moisturizing lotion or petroleum jelly (that does not contain alcohol and is unscented) to the skin on your feet and to dry, brittle toenails. Do not apply lotion between your toes.  Trim your toenails straight across. Do not dig under them or around the cuticle. File the edges of your nails with an emery board or nail file.  Do not cut corns or calluses or try to remove them with medicine.  Wear clean socks or stockings every day. Make sure they are not too tight. Do not wear knee-high stockings since they may decrease blood flow to your legs.  Wear shoes that fit properly and have enough cushioning. To break in new shoes, wear them for just a few hours a day. This prevents you from injuring your feet. Always look in your shoes before you put them on to be sure there are no objects inside.  Do not cross your legs. This may decrease the blood flow to your feet.  If you find a minor scrape,  cut, or break in the skin on your feet, keep it and the skin around it clean and dry. These areas may be cleansed with mild soap and water. Do not cleanse the area with peroxide, alcohol, or iodine.  When you remove an adhesive bandage, be sure not to damage the skin around it.  If you have a wound, look at it several times a day to make sure it is healing.  Do not use heating pads or hot water bottles. They may burn your skin. If you have lost feeling in your feet or legs, you may not know it is happening until it is too late.  Make sure your health care provider performs a complete foot exam at least annually or more often if you have foot problems. Report any cuts, sores, or bruises to your health care provider immediately. SEEK MEDICAL CARE IF:   You have an injury that is not healing.  You have cuts or breaks in the skin.  You have an ingrown nail.  You notice redness on your legs or feet.  You feel burning or tingling in your legs or feet.  You have pain or cramps in your legs and feet.  Your legs or feet are numb.  Your feet always feel cold. SEEK IMMEDIATE MEDICAL CARE IF:   There is increasing redness,   swelling, or pain in or around a wound.  There is a red line that goes up your leg.  Pus is coming from a wound.  You develop a fever or as directed by your health care provider.  You notice a bad smell coming from an ulcer or wound.   This information is not intended to replace advice given to you by your health care provider. Make sure you discuss any questions you have with your health care provider.   Document Released: 02/19/2000 Document Revised: 10/24/2012 Document Reviewed: 07/31/2012 Elsevier Interactive Patient Education 2016 Elsevier Inc.  

## 2015-01-29 NOTE — Progress Notes (Signed)
Patient ID: Jeffrey [name], male   DOB: Nov 15, 1940, 74 y.o.   MRN: [id number]  Subjective: This patient presents again today complaining of ongoing discomfort in his toenails and walking wearing shoes and is requesting nail debridement  Objective: Patient's daughter and wife present and treatment room Patient transfers from wheelchair to treatment table The toenails are elongated, hypertrophic, deformed, discolored and tender direct palpation 6-10  Assessment: Type II diabetic with a history of neuropathy Symptomatic onychomycoses 6-10 History of CVA  Plan: Debridement of toenails 10 mechanically and electrically without any bleeding  Reappoint 3 months

## 2015-02-03 ENCOUNTER — Ambulatory Visit: Payer: Medicare Other | Admitting: Occupational Therapy

## 2015-02-03 ENCOUNTER — Ambulatory Visit: Payer: Medicare Other | Admitting: Physical Therapy

## 2015-02-03 DIAGNOSIS — R278 Other lack of coordination: Secondary | ICD-10-CM

## 2015-02-03 DIAGNOSIS — G8191 Hemiplegia, unspecified affecting right dominant side: Secondary | ICD-10-CM

## 2015-02-03 DIAGNOSIS — M25611 Stiffness of right shoulder, not elsewhere classified: Secondary | ICD-10-CM

## 2015-02-03 DIAGNOSIS — R279 Unspecified lack of coordination: Secondary | ICD-10-CM

## 2015-02-03 DIAGNOSIS — R269 Unspecified abnormalities of gait and mobility: Secondary | ICD-10-CM

## 2015-02-03 DIAGNOSIS — M6281 Muscle weakness (generalized): Secondary | ICD-10-CM | POA: Diagnosis not present

## 2015-02-03 NOTE — Patient Instructions (Signed)
Stroke Prevention Some medical conditions and behaviors are associated with an increased chance of having a stroke. You may prevent a stroke by making healthy choices and managing medical conditions. HOW CAN I REDUCE MY RISK OF HAVING A STROKE?   Stay physically active. Get at least 30 minutes of activity on most or all days.  Do not smoke. It may also be helpful to avoid exposure to secondhand smoke.  Limit alcohol use. Moderate alcohol use is considered to be:  No more than 2 drinks per day for men.  No more than 1 drink per day for nonpregnant women.  Eat healthy foods. This involves:  Eating 5 or more servings of fruits and vegetables a day.  Making dietary changes that address high blood pressure (hypertension), high cholesterol, diabetes, or obesity.  Manage your cholesterol levels.  Making food choices that are high in fiber and low in saturated fat, trans fat, and cholesterol may control cholesterol levels.  Take any prescribed medicines to control cholesterol as directed by your health care provider.  Manage your diabetes.  Controlling your carbohydrate and sugar intake is recommended to manage diabetes.  Take any prescribed medicines to control diabetes as directed by your health care provider.  Control your hypertension.  Making food choices that are low in salt (sodium), saturated fat, trans fat, and cholesterol is recommended to manage hypertension.  Ask your health care provider if you need treatment to lower your blood pressure. Take any prescribed medicines to control hypertension as directed by your health care provider.  If you are 18-39 years of age, have your blood pressure checked every 3-5 years. If you are 40 years of age or older, have your blood pressure checked every year.  Maintain a healthy weight.  Reducing calorie intake and making food choices that are low in sodium, saturated fat, trans fat, and cholesterol are recommended to manage  weight.  Stop drug abuse.  Avoid taking birth control pills.  Talk to your health care provider about the risks of taking birth control pills if you are over 35 years old, smoke, get migraines, or have ever had a blood clot.  Get evaluated for sleep disorders (sleep apnea).  Talk to your health care provider about getting a sleep evaluation if you snore a lot or have excessive sleepiness.  Take medicines only as directed by your health care provider.  For some people, aspirin or blood thinners (anticoagulants) are helpful in reducing the risk of forming abnormal blood clots that can lead to stroke. If you have the irregular heart rhythm of atrial fibrillation, you should be on a blood thinner unless there is a good reason you cannot take them.  Understand all your medicine instructions.  Make sure that other conditions (such as anemia or atherosclerosis) are addressed. SEEK IMMEDIATE MEDICAL CARE IF:   You have sudden weakness or numbness of the face, arm, or leg, especially on one side of the body.  Your face or eyelid droops to one side.  You have sudden confusion.  You have trouble speaking (aphasia) or understanding.  You have sudden trouble seeing in one or both eyes.  You have sudden trouble walking.  You have dizziness.  You have a loss of balance or coordination.  You have a sudden, severe headache with no known cause.  You have new chest pain or an irregular heartbeat. Any of these symptoms may represent a serious problem that is an emergency. Do not wait to see if the symptoms will   go away. Get medical help at once. Call your local emergency services (911 in [place] Do not drive yourself to the hospital.   This information is not intended to replace advice given to you by your health care provider. Make sure you discuss any questions you have with your health care provider.   Document Released: 03/31/2004 Document Revised: 03/14/2014 Document Reviewed:  08/24/2012 Elsevier Interactive Patient Education 2016 Elsevier Inc.  

## 2015-02-03 NOTE — Therapy (Signed)
[facility] [address] [place], [place], [address] Phone: [phone]   Fax:  [phone]  Occupational Therapy Treatment  Patient Details  Name: Jeffrey [name] MRN: [id number] Date of Birth: [birth year] Referring Provider: Dr. Leonie Man  Encounter Date: 02/03/2015      OT End of Session - 02/03/15 1106    Visit Number 5   Number of Visits 17   Date for OT Re-Evaluation 03/13/15   Authorization Type Blue [employer] HMO, no visit limit/no auth, G-code   Authorization - Visit Number 5   Authorization - Number of Visits 10   OT Start Time 1105   OT Stop Time 1145   OT Time Calculation (min) 40 min   Activity Tolerance Patient tolerated treatment well   Behavior During Therapy [facility] for tasks assessed/performed      Past Medical History  Diagnosis Date  . CAD (coronary artery disease)     PCI distal RCA ...2004, residual 70% LAD   /   ...nuclear...03/2007...no ischemia.Marland KitchenMarland Kitchenpreserved LV /  nuclear...03/03/2009...inferior scar..no ischemia..EF 51%  . Dyslipidemia     takes Atorvastatin daily  . Internal hemorrhoids   . Gout     takes Allopurinol daily and Colchicine as needed  . Seasonal allergic rhinitis   . Right carotid bruit   . Cyst of nasopharynx     per ENT Wilburn Cornelia  . Myocardial infarction [facility]) 51yrs ago  . Peripheral edema     takes Lasix daily  . HTN (hypertension)     takes Cardura,Metoprolol,[name],and Amlodipine daily  . Arthritis   . GERD (gastroesophageal reflux disease)     takes Omeprazole daily  . History of colon polyps   . Type 2 diabetes, uncontrolled, with neuropathy ([place]) 1989    takes Invokana daily and has an insulin pump (Dr. [name])  . 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 HCAP (healthcare-associated pneumonia)   . Hemiparesis affecting right side as late effect of cerebrovascular accident ([place]) 09/05/2014  . Anterior cerebral  circulation hemorrhagic infarction [facility]) 11/24/2014    March, 2016, dominant left thalamic and left internal capsule ischemic infarct with resultant hemorrhagic transformation, secondary to small vessel disease. Resulted in right hemiparesis dysarthria and diplopia   //   readmission with aphasia July, 2016, this improved     Past Surgical History  Procedure Laterality Date  . Cataract extraction Bilateral 2010  . Elbow surgery Right 1997  . Balloon angioplasty, artery  1992, 2004    CAD, Dr. Ron Parker  . Colonoscopy  04/03/2002    adenomatous polyp, int hemorrhoids  . Colonoscopy  07/18/2007    normal (Dr. Fuller Plan)  . Rotator cuff repair  09/2011    left, with subacromial decompression  . Nasal septum surgery    . Colonoscopy  09/26/2012    tubular adenoma, sm int hem, rpt 5 yrs Fuller Plan)  . Tonsillectomy and adenoidectomy    . Eye lids raised    . Cardiac catheterization  2004  . Polypectomy N/A 05/27/2013    Procedure: ENDOSCOPIC NASOPHARYNGEAL MASS;  Surgeon: [name], MD    There were no vitals filed for this visit.  Visit Diagnosis:  Right hemiparesis ([place])  Decreased coordination  Stiffness of right shoulder joint      Subjective Assessment - 02/03/15 1211    Subjective  "I'm ok"   Patient Stated Goals improve use of RUE, drive, be able to feel normal around people  Currently in Pain? No/denies                      OT Treatments/Exercises (OP) - 02/03/15 0001    Neurological Re-education Exercises   Shoulder Flexion AAROM;Supine;Both  with PVC frame with min facilitation for control   Elbow Extension AAROM;Both;Supine;AROM  with min facilitation for control    Other Exercises 2 UE ranger for elbow flex/ext and low range shoulder flex with min facilitation for control   Other Grasp and Release Exercises  In sitting, low range functional reaching to grasp/release cylinder objects with min facilitation for control.  Performed with BUEs and then with just  RUE.  Pt with mod cues for compensation patterns/shoulder hike.                  OT Short Term Goals - 01/13/15 2050    OT SHORT TERM GOAL #1   Title Pt will be independent with initial HEP.--check STGs 02/10/15   Time 4   Period Weeks   Status New   OT SHORT TERM GOAL #2   Title Pt will use RUE as gross A/stabilizer at least 25% of the time for ADLs.   Time 4   Period Weeks   Status New   OT SHORT TERM GOAL #3   Title Pt will demo at least 55* R shoulder flex with min compensation in prep for functional reach.   Baseline 45*   Time 4   Period Weeks   Status New   OT SHORT TERM GOAL #4   Title Pt will be able to complete clothing fasteners (buttons, tying shoes) mod I using AE prn.   Time 4   Period Weeks   Status New           OT Long Term Goals - 01/13/15 2053    OT LONG TERM GOAL #1   Title Pt will be independent with updated HEP.--check LTGs 03/13/15   Time 8   Period Weeks   Status New   OT LONG TERM GOAL #2   Title Pt will use RUE as gross A/stabilizer for ADLs at least 50% of the time.   Time 8   Period Weeks   Status New   OT LONG TERM GOAL #3   Title Pt will demo at least 65* R shoulder flex with min compensation in prep for functional reach.   Baseline 45*   Time 8   Period Weeks   Status New   OT LONG TERM GOAL #4   Title Pt will improve RUE functional reaching/coordination for ADLs as shown by improving score on box and blocks test to at least 15 blocks.   Baseline 9 blocks   Time 8   Period Weeks   Status New   OT LONG TERM GOAL #5   Title Pt will perform bathing with supervision.   Time 8   Period Weeks   Status New   Long Term Additional Goals   Additional Long Term Goals Yes   OT LONG TERM GOAL #6   Title Pt will verbalize understanding of cognitive/visual compensation strategies and activities to improve cognition/vision prn.   Time 8   Period Weeks   Status New               Plan - 02/03/15 1301    Clinical  Impression Statement Pt demo incr control with AAROM, but fatigues quickly.  Pt hikes shoulder as compensation for decr control and needs  mod cues to correct.   Plan neuro re-ed, RUE functional use   OT Home Exercise Plan Education provided:  initial HEP issued   Consulted and Agree with Plan of Care Patient        Problem List Patient Active Problem List   Diagnosis Date Noted  . Anterior cerebral circulation hemorrhagic infarction ([place]) 11/24/2014  . Hemiparesis affecting right side as late effect of cerebrovascular accident ([place]) 09/05/2014  . Depression due to stroke ([place]) 09/05/2014  . Thrombocytopenia ([place]) 06/05/2014  . Chronic diastolic heart failure, NYHA class 1 ([place]) 06/05/2014  . Ex-smoker 06/03/2014  . Obesity 06/03/2014  . Benign paroxysmal positional vertigo 04/28/2014  . Right carotid bruit   . Polyneuropathy, diabetic ([place]) 08/05/2012  . Seasonal allergic rhinitis   . Trigger thumb of left hand 10/12/2011  . Gout   . Adenomatous polyps   . CAD (coronary artery disease)   . Essential hypertension   . Hyperlipidemia   . Type 2 diabetes, uncontrolled, with neuropathy ([place])   . Ejection fraction     [facility] 02/03/2015, 1:03 PM  [facility] [address] [place], [place], [address] Phone: [phone]   Fax:  [phone]  Name: Jeffrey [name] MRN: [id number] Date of Birth: 26-Sep-1940  Vianne Bulls, OTR/L 02/03/2015 1:04 PM

## 2015-02-03 NOTE — Therapy (Signed)
[facility] [address] [place], [place], [address] Phone: [phone]   Fax:  [phone]  Physical Therapy Treatment  Patient Details  Name: Jeffrey [name] MRN: [id number] Date of Birth: 01/27/1941 Referring Provider: Leonie Man  Encounter Date: 02/03/2015      PT End of Session - 02/03/15 1323    Visit Number 7   Number of Visits 17   Date for PT Re-Evaluation 03/08/15   Authorization Type Blue Medicare HMO-G- code every 10th visit   PT Start Time 1148   PT Stop Time 1231   PT Time Calculation (min) 43 min   Equipment Utilized During Treatment Gait belt   Activity Tolerance Patient tolerated treatment well   Behavior During Therapy [facility] for tasks assessed/performed      Past Medical History  Diagnosis Date  . CAD (coronary artery disease)     PCI distal RCA ...2004, residual 70% LAD   /   ...nuclear...03/2007...no ischemia.Marland KitchenMarland Kitchenpreserved LV /  nuclear...03/03/2009...inferior scar..no ischemia..EF 51%  . Dyslipidemia     takes Atorvastatin daily  . Internal hemorrhoids   . Gout     takes Allopurinol daily and Colchicine as needed  . Seasonal allergic rhinitis   . Right carotid bruit   . Cyst of nasopharynx     per ENT Wilburn Cornelia  . Myocardial infarction [facility]) 70yr ago  . Peripheral edema     takes Lasix daily  . HTN (hypertension)     takes Cardura,Metoprolol,[name],and Amlodipine daily  . Arthritis   . GERD (gastroesophageal reflux disease)     takes Omeprazole daily  . History of colon polyps   . Type 2 diabetes, uncontrolled, with [place]    takes Invokana daily and has an insulin pump ([name]  . 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 HCAP (healthcare-associated pneumonia)   . Hemiparesis affecting right side as late effect of [place]vascular [place] 09[place]  . Anterior cerebral circulation hemorrhagic infarction ([facility]  11/24/2014    March, 2016, dominant left thalamic and left internal capsule ischemic infarct with resultant hemorrhagic transformation, secondary to small vessel disease. Resulted in right hemiparesis dysarthria and diplopia   //   readmission with aphasia July, 2016, this improved     Past Surgical History  Procedure Laterality Date  . Cataract extraction Bilateral 2010  . Elbow surgery Right 1997  . Balloon angioplasty, artery  1992, 2004    CAD, Dr. KRon Parker . Colonoscopy  04/03/2002    adenomatous polyp, int hemorrhoids  . Colonoscopy  07/18/2007    normal (Dr. SFuller Plan  . Rotator cuff repair  09/2011    left, with subacromial decompression  . Nasal septum surgery    . Colonoscopy  09/26/2012    tubular adenoma, sm int hem, rpt 5 yrs (Fuller Plan  . Tonsillectomy and adenoidectomy    . Eye lids raised    . Cardiac catheterization  2004  . Polypectomy N/A 05/27/2013    Procedure: ENDOSCOPIC NASOPHARYNGEAL MASS;  Surgeon: DJerrell Belfast MD    There were no vitals filed for this visit.  Visit Diagnosis:  Abnormality of gait      Subjective Assessment - 02/03/15 1314    Subjective Pt reports only walking once a day with his daughter.  Is doing 3 exercises consistently.    Patient is accompained by: Family member   Patient Stated Goals Pt's goal for therapy is to avoid losing the strength  he has regained since March.  He would like to be mobile without walker.   Currently in Pain? No/denies            [facility] Adult PT Treatment/Exercise - 02/03/15 1315    Transfers   Transfers Sit to Stand;Stand to Sit   Sit to Stand 5: Supervision   Stand to Sit 5: Supervision   Ambulation/Gait   Ambulation/Gait Yes   Ambulation/Gait Assistance 4: Min guard   Ambulation Distance (Feet) 150 Feet  50 x 1   Assistive device Rolling walker   Gait Pattern Step-through pattern;Decreased stance time - right;Decreased step length - left;Decreased weight shift to right;Right genu  recurvatum;Ataxic;Abducted- right   Ambulation Surface Level;Indoor   Gait Comments R reaction AFO and heel wedge;improved R foot clearance with AFO.  Pt able to hold RUE on RW without assistance with 3 episodes of hand sliding off RW but aware and able to replace without assistance   Timed Up and Go Test   TUG Normal TUG   Normal TUG (seconds) 52.62  with R AFO heel wedge;73 seconds without AFO                PT Education - 02/03/15 1322    Education provided Yes   Education Details goals met and progress since eval, CVA education, increasing walking at home with daughter and not relying on w/c for mobility   Person(s) Educated Patient;Spouse;Child(ren)   Methods Explanation;Handout;Verbal cues   Comprehension Verbalized understanding;Returned demonstration          PT Short Term Goals - 02/03/15 1324    PT SHORT TERM GOAL #1   Title Pt will perform HEP with family assistance/supervision, for lower extremitiy strengthening.  TARGET 02/06/15   Time 4   Period Weeks   Status Achieved   PT SHORT TERM GOAL #2   Title Pt will perform sit<>stand transfers with supervision, for improved efficiency and safety with transfers.   Baseline Met 11/22.   Time 4   Period Weeks   Status Achieved   PT SHORT TERM GOAL #3   Title Pt will improve TUG score to less than or equal to 70 seconds for decreased fall risk.   Baseline 52.62 seconds with R AFO and heel wedge;73 seconds without AFO   Time 4   Period Weeks   Status Achieved   PT SHORT TERM GOAL #4   Title Pt will ambulate at least 150 ft using least restrictive assistive device with minimal assistance for improved gait efficiency.   Baseline Met 11/22.   Time 4   Period Weeks   Status Achieved   PT SHORT TERM GOAL #5   Title Pt will verbalize understanding of CVA education.   Time 4   Period Weeks   Status Achieved           PT Long Term Goals - 01/08/15 1311    PT LONG TERM GOAL #1   Title Pt will verbalize  understanding of fall prevention within home environment.  TARGET 03/08/15   Time 8   Period Weeks   Status New   PT LONG TERM GOAL #2   Title Pt will improve Timed Up and GO score to less than or equal to 50 seconds for decreased fall risk.   Time 8   Period Weeks   Status New   PT LONG TERM GOAL #3   Title Pt will improve gait velocity to at least 0.8 ft/sec for improved gait efficiency and  safety.   Time 8   Period Weeks   Status New   PT LONG TERM GOAL #4   Title Berg Balance score to be assessed, with pt improving Berg score by at least 8 points.   Time 8   Period Weeks   Status New   PT LONG TERM GOAL #5   Title Pt will ambulate at least 250 ft. using least restrictive assistive device with supervision for improved efficiency and safety with gait.   Time 8   Period Weeks   Additional Long Term Goals   Additional Long Term Goals Yes   PT LONG TERM GOAL #6   Title Pt will verbalize plans for continued community fitness upon D/C from PT.   Time 8   Period Weeks   Status New               Plan - 02/03/15 1324    Clinical Impression Statement Pt has met all STG's.  Significant difference in TUG with vs without AFO.  Long discussion with patient, wife and daughter on need to increase ambulation at home with daughters assistance and decrease reliance on w/c for mobility in the home.  Daughter agrees and feels comfortable with increased ambulation.  Continue PT per POC.   Pt will benefit from skilled therapeutic intervention in order to improve on the following deficits Abnormal gait;Decreased balance;Decreased mobility;Decreased coordination;Decreased strength;Difficulty walking;Impaired sensation;Impaired tone   Rehab Potential Good   PT Frequency 2x / week   PT Duration 8 weeks  plus eval   PT Treatment/Interventions ADLs/Self Care Home Management;Therapeutic exercise;Therapeutic activities;Functional mobility training;Gait training;Balance training;Neuromuscular  re-education;Electrical Stimulation   PT Next Visit Plan Add strengthening exercises to HEP (open and closed chain);follow up on walking at home   Consulted and Agree with Plan of Care Patient;Family member/caregiver   Family Member Consulted Daughter, and wife        Problem List Patient Active Problem List   Diagnosis Date Noted  . Anterior cerebral circulation hemorrhagic infarction ([place]) 11/24/2014  . Hemiparesis affecting right side as late effect of cerebrovascular accident ([place]) 09/05/2014  . Depression due to stroke ([place]) 09/05/2014  . Thrombocytopenia ([place]) 06/05/2014  . Chronic diastolic heart failure, NYHA class 1 ([place]) 06/05/2014  . Ex-smoker 06/03/2014  . Obesity 06/03/2014  . Benign paroxysmal positional vertigo 04/28/2014  . Right carotid bruit   . Polyneuropathy, diabetic ([place]) 08/05/2012  . Seasonal allergic rhinitis   . Trigger thumb of left hand 10/12/2011  . Gout   . Adenomatous polyps   . CAD (coronary artery disease)   . Essential hypertension   . Hyperlipidemia   . Type 2 diabetes, uncontrolled, with neuropathy ([place])   . Ejection fraction     Narda Bonds 02/03/2015, 1:33 PM  [facility] [address] [place], [place], [address] Phone: [phone]   Fax:  [phone]  Name: Jeffrey [name] MRN: [id number] Date of Birth: 1940-07-27    Narda Bonds, [place] 02/03/2015 1:33 PM Phone: [phone] Fax: [phone]

## 2015-02-05 ENCOUNTER — Encounter: Payer: Self-pay | Admitting: Occupational Therapy

## 2015-02-05 ENCOUNTER — Ambulatory Visit: Payer: Medicare Other | Attending: Neurology | Admitting: Physical Therapy

## 2015-02-05 ENCOUNTER — Ambulatory Visit: Payer: Medicare Other | Admitting: Occupational Therapy

## 2015-02-05 DIAGNOSIS — I69319 Unspecified symptoms and signs involving cognitive functions following cerebral infarction: Secondary | ICD-10-CM | POA: Insufficient documentation

## 2015-02-05 DIAGNOSIS — G8191 Hemiplegia, unspecified affecting right dominant side: Secondary | ICD-10-CM | POA: Diagnosis present

## 2015-02-05 DIAGNOSIS — I69351 Hemiplegia and hemiparesis following cerebral infarction affecting right dominant side: Secondary | ICD-10-CM | POA: Insufficient documentation

## 2015-02-05 DIAGNOSIS — H539 Unspecified visual disturbance: Secondary | ICD-10-CM | POA: Diagnosis present

## 2015-02-05 DIAGNOSIS — M25611 Stiffness of right shoulder, not elsewhere classified: Secondary | ICD-10-CM

## 2015-02-05 DIAGNOSIS — R279 Unspecified lack of coordination: Secondary | ICD-10-CM | POA: Diagnosis present

## 2015-02-05 DIAGNOSIS — R278 Other lack of coordination: Secondary | ICD-10-CM

## 2015-02-05 DIAGNOSIS — R2689 Other abnormalities of gait and mobility: Secondary | ICD-10-CM | POA: Diagnosis present

## 2015-02-05 DIAGNOSIS — R269 Unspecified abnormalities of gait and mobility: Secondary | ICD-10-CM | POA: Insufficient documentation

## 2015-02-05 DIAGNOSIS — R2681 Unsteadiness on feet: Secondary | ICD-10-CM | POA: Insufficient documentation

## 2015-02-05 DIAGNOSIS — M6281 Muscle weakness (generalized): Secondary | ICD-10-CM | POA: Diagnosis present

## 2015-02-05 NOTE — Patient Instructions (Signed)
EXTENSION: Sitting - Resistance Band (Active)    Sit with feet flat. Against green resistance band, straighten right knee. Complete 10 repetitions. Perform 2 sessions per day.  Copyright  VHI. All rights reserved.   FLEXION: Sitting - Resistance Band (Active)    Sit with right leg extended. Against green resistance band, bend knee and draw foot backward. Complete 10 repetitions. Perform 2 sessions per day.  http://gtsc.exer.us/230   Copyright  VHI. All rights reserved.   ANKLE: Dorsiflexion (Band)    Sit at edge of surface. Place green band around top of foot. Keeping heel on floor, raise toes of banded foot. Hold 3 seconds. Use green band. 10 reps 2 times a day.  Copyright  VHI. All rights reserved.   ANKLE: Eversion, Unilateral (Band)    Place band around left foot. Keeping heel in place, raise toes of banded foot up and away from body. Do not move hip. Hold 3 seconds. Use green band. 10 reps 2 times a day.  Copyright  VHI. All rights reserved.   Bracing With Single Leg Bridging (Hook-Lying)    Lie with one hip and knee bent and spine neutral. Tighten pelvic floor and abdominals and hold. Lift same side of bottom. Repeat 10 times. Do 2 times a day.   Copyright  VHI. All rights reserved.   External Rotation: Hip - Knees Apart (Hook-Lying)    Lie with hips and knees bent, band tied just above knees. Pull knees apart. Hold for 3 seconds. Relax for 3 seconds. Repeat 10 times. Do 2 times a day.  Copyright  VHI. All rights reserved.

## 2015-02-05 NOTE — Therapy (Signed)
[facility] [address] [place], [place], [address] Phone: [phone]   Fax:  [phone]  Physical Therapy Treatment  Patient Details  Name: Jeffrey [name] MRN: [id number] Date of Birth: 09/07/1940 Referring Provider: Leonie Man  Encounter Date: 02/05/2015      PT End of Session - 02/05/15 2106    Visit Number 8   Number of Visits 17   Date for PT Re-Evaluation 03/08/15   Authorization Type Blue Medicare HMO-G- code every 10th visit   PT Start Time 1319   PT Stop Time 1400   PT Time Calculation (min) 41 min   Activity Tolerance Patient tolerated treatment well   Behavior During Therapy [facility] for tasks assessed/performed      Past Medical History  Diagnosis Date  . CAD (coronary artery disease)     PCI distal RCA ...2004, residual 70% LAD   /   ...nuclear...03/2007...no ischemia.Marland KitchenMarland Kitchenpreserved LV /  nuclear...03/03/2009...inferior scar..no ischemia..EF 51%  . Dyslipidemia     takes Atorvastatin daily  . Internal hemorrhoids   . Gout     takes Allopurinol daily and Colchicine as needed  . Seasonal allergic rhinitis   . Right carotid bruit   . Cyst of nasopharynx     per ENT Wilburn Cornelia  . Myocardial infarction [facility]) 31yr ago  . Peripheral edema     takes Lasix daily  . HTN (hypertension)     takes Cardura,Metoprolol,[name],and Amlodipine daily  . Arthritis   . GERD (gastroesophageal reflux disease)     takes Omeprazole daily  . History of colon polyps   . Type 2 diabetes, uncontrolled, with neuropathy [place] 1989    takes Invokana daily and has an insulin pump ([name]  . 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 HCAP (healthcare-associated pneumonia)   . Hemiparesis affecting right side as late effect of cerebrovascular accident ([place] 09/05/2014  . Anterior cerebral circulation hemorrhagic infarction ([facility] 11/24/2014    March, 2016, dominant left thalamic and  left internal capsule ischemic infarct with resultant hemorrhagic transformation, secondary to small vessel disease. Resulted in right hemiparesis dysarthria and diplopia   //   readmission with aphasia July, 2016, this improved     Past Surgical History  Procedure Laterality Date  . Cataract extraction Bilateral 2010  . Elbow surgery Right 1997  . Balloon angioplasty, artery  1992, 2004    CAD, Dr. KRon Parker . Colonoscopy  04/03/2002    adenomatous polyp, int hemorrhoids  . Colonoscopy  07/18/2007    normal (Dr. SFuller Plan  . Rotator cuff repair  09/2011    left, with subacromial decompression  . Nasal septum surgery    . Colonoscopy  09/26/2012    tubular adenoma, sm int hem, rpt 5 yrs (Fuller Plan  . Tonsillectomy and adenoidectomy    . Eye lids raised    . Cardiac catheterization  2004  . Polypectomy N/A 05/27/2013    Procedure: ENDOSCOPIC NASOPHARYNGEAL MASS;  Surgeon: DJerrell Belfast MD    There were no vitals filed for this visit.  Visit Diagnosis:  Muscle weakness of lower extremity      Subjective Assessment - 02/05/15 1334    Subjective Pt and daughter report pt is doing lots of walking at home and using w/c very little when she is there.  Has been doing some walking with his wife as well.   Patient is accompained by: Family member   Patient Stated Goals Pt's  goal for therapy is to avoid losing the strength he has regained since March.  He would like to be mobile without walker.   Currently in Pain? No/denies        Pt performed therapeutic exercise as written in patient instructions.  See patient instructions for details.        PT Education - 02/05/15 2105    Education provided Yes   Education Details HEP   Person(s) Educated Patient   Methods Explanation;Demonstration;Handout   Comprehension Verbalized understanding;Returned demonstration          PT Short Term Goals - 02/03/15 1324    PT SHORT TERM GOAL #1   Title Pt will perform HEP with family  assistance/supervision, for lower extremitiy strengthening.  TARGET 02/06/15   Time 4   Period Weeks   Status Achieved   PT SHORT TERM GOAL #2   Title Pt will perform sit<>stand transfers with supervision, for improved efficiency and safety with transfers.   Baseline Met 11/22.   Time 4   Period Weeks   Status Achieved   PT SHORT TERM GOAL #3   Title Pt will improve TUG score to less than or equal to 70 seconds for decreased fall risk.   Baseline 52.62 seconds with R AFO and heel wedge;73 seconds without AFO   Time 4   Period Weeks   Status Achieved   PT SHORT TERM GOAL #4   Title Pt will ambulate at least 150 ft using least restrictive assistive device with minimal assistance for improved gait efficiency.   Baseline Met 11/22.   Time 4   Period Weeks   Status Achieved   PT SHORT TERM GOAL #5   Title Pt will verbalize understanding of CVA education.   Time 4   Period Weeks   Status Achieved           PT Long Term Goals - 01/08/15 1311    PT LONG TERM GOAL #1   Title Pt will verbalize understanding of fall prevention within home environment.  TARGET 03/08/15   Time 8   Period Weeks   Status New   PT LONG TERM GOAL #2   Title Pt will improve Timed Up and GO score to less than or equal to 50 seconds for decreased fall risk.   Time 8   Period Weeks   Status New   PT LONG TERM GOAL #3   Title Pt will improve gait velocity to at least 0.8 ft/sec for improved gait efficiency and safety.   Time 8   Period Weeks   Status New   PT LONG TERM GOAL #4   Title Berg Balance score to be assessed, with pt improving Berg score by at least 8 points.   Time 8   Period Weeks   Status New   PT LONG TERM GOAL #5   Title Pt will ambulate at least 250 ft. using least restrictive assistive device with supervision for improved efficiency and safety with gait.   Time 8   Period Weeks   Additional Long Term Goals   Additional Long Term Goals Yes   PT LONG TERM GOAL #6   Title Pt will  verbalize plans for continued community fitness upon D/C from PT.   Time 8   Period Weeks   Status New               Plan - 02/05/15 2106    Clinical Impression Statement Pt and family report increased ambulation  at home since last visit.  Continues to be motivted to improve mobility and strength.  Continue PT per POC.   Pt will benefit from skilled therapeutic intervention in order to improve on the following deficits Abnormal gait;Decreased balance;Decreased mobility;Decreased coordination;Decreased strength;Difficulty walking;Impaired sensation;Impaired tone   Rehab Potential Good   PT Frequency 2x / week   PT Duration 8 weeks  plus eval   PT Treatment/Interventions ADLs/Self Care Home Management;Therapeutic exercise;Therapeutic activities;Functional mobility training;Gait training;Balance training;Neuromuscular re-education;Electrical Stimulation   PT Next Visit Plan Add closed chain RLE strengthening to HEP   Consulted and Agree with Plan of Care Patient;Family member/caregiver   Family Member Consulted Daughter, and wife        Problem List Patient Active Problem List   Diagnosis Date Noted  . Anterior cerebral circulation hemorrhagic infarction ([place]) 11/24/2014  . Hemiparesis affecting right side as late effect of cerebrovascular accident ([place]) 09/05/2014  . Depression due to stroke ([place]) 09/05/2014  . Thrombocytopenia ([place]) 06/05/2014  . Chronic diastolic heart failure, NYHA class 1 ([place]) 06/05/2014  . Ex-smoker 06/03/2014  . Obesity 06/03/2014  . Benign paroxysmal positional vertigo 04/28/2014  . Right carotid bruit   . Polyneuropathy, diabetic ([place]) 08/05/2012  . Seasonal allergic rhinitis   . Trigger thumb of left hand 10/12/2011  . Gout   . Adenomatous polyps   . CAD (coronary artery disease)   . Essential hypertension   . Hyperlipidemia   . Type 2 diabetes, uncontrolled, with neuropathy ([place])   . Ejection fraction     Narda Bonds 02/05/2015, 9:09 PM  [facility] [address] [place], [place], [address] Phone: [phone]   Fax:  [phone]  Name: HARLES EVETTS MRN: [id number] Date of Birth: 1940-03-10    [place], [place] 02/05/2015 9:09 PM Phone: [phone] Fax: [phone]

## 2015-02-05 NOTE — Therapy (Signed)
[facility] [address] [place], [place], [address] Phone: [phone]   Fax:  [phone]  Occupational Therapy Treatment  Patient Details  Name: Jeffrey [name] MRN: [id number] Date of Birth: Feb 13, 1941 Referring Provider: Dr. Leonie Man  Encounter Date: 02/05/2015      OT End of Session - 02/05/15 1609    Visit Number 6   Number of Visits 17   Date for OT Re-Evaluation 03/13/15   Authorization Type [employer] HMO, no visit limit/no auth, G-code   Authorization - Visit Number 6   Authorization - Number of Visits 10   OT Start Time 1401   OT Stop Time 1445   OT Time Calculation (min) 44 min   Activity Tolerance Patient tolerated treatment well      Past Medical History  Diagnosis Date  . CAD (coronary artery disease)     PCI distal RCA ...2004, residual 70% LAD   /   ...nuclear...03/2007...no ischemia.Marland KitchenMarland Kitchenpreserved LV /  nuclear...03/03/2009...inferior scar..no ischemia..EF 51%  . Dyslipidemia     takes Atorvastatin daily  . Internal hemorrhoids   . Gout     takes Allopurinol daily and Colchicine as needed  . Seasonal allergic rhinitis   . Right carotid bruit   . Cyst of nasopharynx     per ENT Wilburn Cornelia  . Myocardial infarction [facility]) 84yrs ago  . Peripheral edema     takes Lasix daily  . HTN (hypertension)     takes Cardura,Metoprolol,[name],and Amlodipine daily  . Arthritis   . GERD (gastroesophageal reflux disease)     takes Omeprazole daily  . History of colon polyps   . Type 2 diabetes, uncontrolled, with neuropathy ([place]) 1989    takes Invokana daily and has an insulin pump (Dr. [name])  . 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 HCAP (healthcare-associated pneumonia)   . Hemiparesis affecting right side as late effect of cerebrovascular accident ([place]) 09/05/2014  . Anterior cerebral circulation hemorrhagic infarction [facility]) 11/24/2014    March, 2016,  dominant left thalamic and left internal capsule ischemic infarct with resultant hemorrhagic transformation, secondary to small vessel disease. Resulted in right hemiparesis dysarthria and diplopia   //   readmission with aphasia July, 2016, this improved     Past Surgical History  Procedure Laterality Date  . Cataract extraction Bilateral 2010  . Elbow surgery Right 1997  . Balloon angioplasty, artery  1992, 2004    CAD, Dr. Ron Parker  . Colonoscopy  04/03/2002    adenomatous polyp, int hemorrhoids  . Colonoscopy  07/18/2007    normal (Dr. Fuller Plan)  . Rotator cuff repair  09/2011    left, with subacromial decompression  . Nasal septum surgery    . Colonoscopy  09/26/2012    tubular adenoma, sm int hem, rpt 5 yrs Fuller Plan)  . Tonsillectomy and adenoidectomy    . Eye lids raised    . Cardiac catheterization  2004  . Polypectomy N/A 05/27/2013    Procedure: ENDOSCOPIC NASOPHARYNGEAL MASS;  Surgeon: [name], MD    There were no vitals filed for this visit.  Visit Diagnosis:  Right hemiparesis (HCC)  Decreased coordination  Stiffness of right shoulder joint  Cognitive deficits following cerebral infarction  Visual disturbance      Subjective Assessment - 02/05/15 1409    Subjective  My shoulder feels pretty good   Patient is accompained by: Family member  wife and dtr   Patient Stated  Goals improve use of RUE, drive, be able to feel normal around people   Currently in Pain? No/denies                      OT Treatments/Exercises (OP) - 02/05/15 0001    Neurological Re-education Exercises   Other Exercises 1 Neuro re ed in supine to address controlled isolated movement in bilateral closed chained activities with emphasis on timing, grading and sequencing movement.  Focused on forward reach and overhead reach. Transitioned to sitting and then addressed unilateral foward reach in weight bearing using UE ranger. Pt needs min vc's to slow movement down and min - mod  facilitation for all activities as long as he is in closed chain or weight bearing positions.  Also addressed weight bearing on RUE while addressiing trunk rotation, body on arm and gaining improved head and neck mobility and control.                   OT Short Term Goals - 02/05/15 1607    OT SHORT TERM GOAL #1   Title Pt will be independent with initial HEP.--check STGs 02/10/15   Time 4   Period Weeks   Status New   OT SHORT TERM GOAL #2   Title Pt will use RUE as gross A/stabilizer at least 25% of the time for ADLs.   Time 4   Period Weeks   Status New   OT SHORT TERM GOAL #3   Title Pt will demo at least 55* R shoulder flex with min compensation in prep for functional reach.   Baseline 45*   Time 4   Period Weeks   Status New   OT SHORT TERM GOAL #4   Title Pt will be able to complete clothing fasteners (buttons, tying shoes) mod I using AE prn.   Time 4   Period Weeks   Status New           OT Long Term Goals - 02/05/15 1607    OT LONG TERM GOAL #1   Title Pt will be independent with updated HEP.--check LTGs 03/13/15   Time 8   Period Weeks   Status New   OT LONG TERM GOAL #2   Title Pt will use RUE as gross A/stabilizer for ADLs at least 50% of the time.   Time 8   Period Weeks   Status New   OT LONG TERM GOAL #3   Title Pt will demo at least 65* R shoulder flex with min compensation in prep for functional reach.   Baseline 45*   Time 8   Period Weeks   Status New   OT LONG TERM GOAL #4   Title Pt will improve RUE functional reaching/coordination for ADLs as shown by improving score on box and blocks test to at least 15 blocks.   Baseline 9 blocks   Time 8   Period Weeks   Status New   OT LONG TERM GOAL #5   Title Pt will perform bathing with supervision.   Time 8   Period Weeks   Status New   OT LONG TERM GOAL #6   Title Pt will verbalize understanding of cognitive/visual compensation strategies and activities to improve cognition/vision  prn.   Time 8   Period Weeks   Status New               Plan - 02/05/15 1607    Clinical Impression Statement Pt making  good progress toward goals. Pt benefits from repetition and practice and control greatly improves in closed chain or weight bearing activities, as well as slowing activity down.    Pt will benefit from skilled therapeutic intervention in order to improve on the following deficits (Retired) Decreased cognition;Decreased mobility;Decreased strength;Impaired vision/preception;Impaired UE functional use;Decreased knowledge of use of DME;Decreased balance;Impaired tone;Impaired sensation;Decreased coordination;Decreased range of motion   Rehab Potential Good   Clinical Impairments Affecting Rehab Potential severity of deficits, sensory deficits   OT Frequency 2x / week   OT Duration 8 weeks   OT Treatment/Interventions Self-care/ADL training;Therapeutic exercise;Functional Mobility Training;Patient/family education;Ultrasound;Neuromuscular education;Manual Therapy;Splinting;Therapeutic exercises;Cryotherapy;Parrafin;DME and/or AE instruction;Therapeutic activities;Cognitive remediation/compensation;Visual/perceptual remediation/compensation;Passive range of motion;Moist Heat;Contrast Bath;Fluidtherapy;Electrical Stimulation   Plan neuro re ed, RUE functional use, closed chian and weight bearing activities   OT Home Exercise Plan Education provided:  initial HEP issued   Consulted and Agree with Plan of Care Patient   Family Member Consulted wife, dtr        Problem List Patient Active Problem List   Diagnosis Date Noted  . Anterior cerebral circulation hemorrhagic infarction ([place]) 11/24/2014  . Hemiparesis affecting right side as late effect of cerebrovascular accident ([place]) 09/05/2014  . Depression due to stroke ([place]) 09/05/2014  . Thrombocytopenia ([place]) 06/05/2014  . Chronic diastolic heart failure, NYHA class 1 ([place]) 06/05/2014  . Ex-smoker 06/03/2014  .  Obesity 06/03/2014  . Benign paroxysmal positional vertigo 04/28/2014  . Right carotid bruit   . Polyneuropathy, diabetic ([place]) 08/05/2012  . Seasonal allergic rhinitis   . Trigger thumb of left hand 10/12/2011  . Gout   . Adenomatous polyps   . CAD (coronary artery disease)   . Essential hypertension   . Hyperlipidemia   . Type 2 diabetes, uncontrolled, with neuropathy ([place])   . Ejection fraction     Quay Burow, OTR/L 02/05/2015, 4:10 PM  [facility] [address] [place], [place], [address] Phone: [phone]   Fax:  [phone]  Name: Jeffrey [name] MRN: [id number] Date of Birth: 1940-11-12

## 2015-02-10 ENCOUNTER — Ambulatory Visit: Payer: Medicare Other | Admitting: Occupational Therapy

## 2015-02-10 DIAGNOSIS — M25611 Stiffness of right shoulder, not elsewhere classified: Secondary | ICD-10-CM

## 2015-02-10 DIAGNOSIS — G8191 Hemiplegia, unspecified affecting right dominant side: Secondary | ICD-10-CM

## 2015-02-10 DIAGNOSIS — M6281 Muscle weakness (generalized): Secondary | ICD-10-CM | POA: Diagnosis not present

## 2015-02-10 DIAGNOSIS — R278 Other lack of coordination: Secondary | ICD-10-CM

## 2015-02-10 DIAGNOSIS — R279 Unspecified lack of coordination: Secondary | ICD-10-CM

## 2015-02-10 NOTE — Therapy (Signed)
[facility] [address] [place], [place], [address] Phone: [phone]   Fax:  [phone]  Occupational Therapy Treatment  Patient Details  Name: Jeffrey [name] MRN: [id number] Date of Birth: 1940-05-31 Referring Provider: Dr. Leonie Man  Encounter Date: 02/10/2015      OT End of Session - 02/10/15 1033    Visit Number 7   Number of Visits 17   Authorization Type Blue Medicare HMO, no visit limit/no auth, G-code   Authorization - Visit Number 7   Authorization - Number of Visits 10   OT Start Time 1022   OT Stop Time 1100   OT Time Calculation (min) 38 min   Activity Tolerance Patient tolerated treatment well   Behavior During Therapy [facility] for tasks assessed/performed      Past Medical History  Diagnosis Date  . CAD (coronary artery disease)     PCI distal RCA ...2004, residual 70% LAD   /   ...nuclear...03/2007...no ischemia.Marland KitchenMarland Kitchenpreserved LV /  nuclear...03/03/2009...inferior scar..no ischemia..EF 51%  . Dyslipidemia     takes Atorvastatin daily  . Internal hemorrhoids   . Gout     takes Allopurinol daily and Colchicine as needed  . Seasonal allergic rhinitis   . Right carotid bruit   . Cyst of nasopharynx     per ENT Wilburn Cornelia  . Myocardial infarction [facility]) 81yrs ago  . Peripheral edema     takes Lasix daily  . HTN (hypertension)     takes Cardura,Metoprolol,[name],and Amlodipine daily  . Arthritis   . GERD (gastroesophageal reflux disease)     takes Omeprazole daily  . History of colon polyps   . Type 2 diabetes, uncontrolled, with neuropathy ([place]) 1989    takes Invokana daily and has an insulin pump (Dr. [name])  . 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 HCAP (healthcare-associated pneumonia)   . Hemiparesis affecting right side as late effect of cerebrovascular accident ([place]) 09/05/2014  . Anterior cerebral circulation hemorrhagic infarction [facility])  11/24/2014    March, 2016, dominant left thalamic and left internal capsule ischemic infarct with resultant hemorrhagic transformation, secondary to small vessel disease. Resulted in right hemiparesis dysarthria and diplopia   //   readmission with aphasia July, 2016, this improved     Past Surgical History  Procedure Laterality Date  . Cataract extraction Bilateral 2010  . Elbow surgery Right 1997  . Balloon angioplasty, artery  1992, 2004    CAD, Dr. Ron Parker  . Colonoscopy  04/03/2002    adenomatous polyp, int hemorrhoids  . Colonoscopy  07/18/2007    normal (Dr. Fuller Plan)  . Rotator cuff repair  09/2011    left, with subacromial decompression  . Nasal septum surgery    . Colonoscopy  09/26/2012    tubular adenoma, sm int hem, rpt 5 yrs Fuller Plan)  . Tonsillectomy and adenoidectomy    . Eye lids raised    . Cardiac catheterization  2004  . Polypectomy N/A 05/27/2013    Procedure: ENDOSCOPIC NASOPHARYNGEAL MASS;  Surgeon: [name], MD    There were no vitals filed for this visit.  Visit Diagnosis:  Right hemiparesis ([place])  Decreased coordination  Stiffness of right shoulder joint      Subjective Assessment - 02/10/15 1032    Subjective  denies shoulder pain   Patient is accompained by: Family member   Patient Stated Goals improve use of RUE, drive, be able to feel normal around people  Currently in Pain? No/denies      Treatment: supine closed chain shoulder flexion and chest press with PVC pipe frame, min -mod facilitation Seated weightbearing through right elbow simulating pushing up  In prep for bed mobility, x 5 reps, min v.c. Seated edge of mat weightbearing through RUE on UE ranger, while performing lateral weight shifts for body on arm movements, mod facilitation to isolate upper and lower trunk movements. Seated  AA/ROM shoulder flexion and abduction with UE ranger with therapist performing gentle resistance. Pt ambulated from mat to w/c with walker min  A.                          OT Short Term Goals - 02/05/15 1607    OT SHORT TERM GOAL #1   Title Pt will be independent with initial HEP.--check STGs 02/10/15   Time 4   Period Weeks   Status New   OT SHORT TERM GOAL #2   Title Pt will use RUE as gross A/stabilizer at least 25% of the time for ADLs.   Time 4   Period Weeks   Status New   OT SHORT TERM GOAL #3   Title Pt will demo at least 55* R shoulder flex with min compensation in prep for functional reach.   Baseline 45*   Time 4   Period Weeks   Status New   OT SHORT TERM GOAL #4   Title Pt will be able to complete clothing fasteners (buttons, tying shoes) mod I using AE prn.   Time 4   Period Weeks   Status New           OT Long Term Goals - 02/05/15 1607    OT LONG TERM GOAL #1   Title Pt will be independent with updated HEP.--check LTGs 03/13/15   Time 8   Period Weeks   Status New   OT LONG TERM GOAL #2   Title Pt will use RUE as gross A/stabilizer for ADLs at least 50% of the time.   Time 8   Period Weeks   Status New   OT LONG TERM GOAL #3   Title Pt will demo at least 65* R shoulder flex with min compensation in prep for functional reach.   Baseline 45*   Time 8   Period Weeks   Status New   OT LONG TERM GOAL #4   Title Pt will improve RUE functional reaching/coordination for ADLs as shown by improving score on box and blocks test to at least 15 blocks.   Baseline 9 blocks   Time 8   Period Weeks   Status New   OT LONG TERM GOAL #5   Title Pt will perform bathing with supervision.   Time 8   Period Weeks   Status New   OT LONG TERM GOAL #6   Title Pt will verbalize understanding of cognitive/visual compensation strategies and activities to improve cognition/vision prn.   Time 8   Period Weeks   Status New               Plan - 02/10/15 1046    Clinical Impression Statement Pt is progressing towards goals for RUE functional use.   Pt will benefit from skilled  therapeutic intervention in order to improve on the following deficits (Retired) Decreased cognition;Decreased mobility;Decreased strength;Impaired vision/preception;Impaired UE functional use;Decreased knowledge of use of DME;Decreased balance;Impaired tone;Impaired sensation;Decreased coordination;Decreased range of motion   Clinical Impairments  Affecting Rehab Potential severity of deficits, sensory deficits   OT Frequency 2x / week   OT Duration 8 weeks   Plan neuro re-ed, RUE functional use   OT Home Exercise Plan Education provided:  initial HEP issued   Consulted and Agree with Plan of Care Patient        Problem List Patient Active Problem List   Diagnosis Date Noted  . Anterior cerebral circulation hemorrhagic infarction ([place]) 11/24/2014  . Hemiparesis affecting right side as late effect of cerebrovascular accident ([place]) 09/05/2014  . Depression due to stroke ([place]) 09/05/2014  . Thrombocytopenia ([place]) 06/05/2014  . Chronic diastolic heart failure, NYHA class 1 ([place]) 06/05/2014  . Ex-smoker 06/03/2014  . Obesity 06/03/2014  . Benign paroxysmal positional vertigo 04/28/2014  . Right carotid bruit   . Polyneuropathy, diabetic ([place]) 08/05/2012  . Seasonal allergic rhinitis   . Trigger thumb of left hand 10/12/2011  . Gout   . Adenomatous polyps   . CAD (coronary artery disease)   . Essential hypertension   . Hyperlipidemia   . Type 2 diabetes, uncontrolled, with neuropathy ([place])   . Ejection fraction     RINE,KATHRYN 02/10/2015, 10:48 AM Theone Murdoch, OTR/L [phone] Phone: [phone] 1:00 PM 02/10/2015 [facility] [address] [place], [place], [address] Phone: [phone]   Fax:  [phone]  Name: GARNER VANHAITSMA MRN: [id number] Date of Birth: [birth year]

## 2015-02-13 ENCOUNTER — Ambulatory Visit: Payer: Medicare Other | Admitting: Rehabilitation

## 2015-02-13 ENCOUNTER — Encounter: Payer: Self-pay | Admitting: Rehabilitation

## 2015-02-13 ENCOUNTER — Ambulatory Visit: Payer: Medicare Other | Admitting: *Deleted

## 2015-02-13 DIAGNOSIS — R269 Unspecified abnormalities of gait and mobility: Secondary | ICD-10-CM

## 2015-02-13 DIAGNOSIS — M6281 Muscle weakness (generalized): Secondary | ICD-10-CM | POA: Diagnosis not present

## 2015-02-13 DIAGNOSIS — I69351 Hemiplegia and hemiparesis following cerebral infarction affecting right dominant side: Secondary | ICD-10-CM

## 2015-02-13 DIAGNOSIS — R278 Other lack of coordination: Secondary | ICD-10-CM

## 2015-02-13 DIAGNOSIS — M25611 Stiffness of right shoulder, not elsewhere classified: Secondary | ICD-10-CM

## 2015-02-13 DIAGNOSIS — G8191 Hemiplegia, unspecified affecting right dominant side: Secondary | ICD-10-CM

## 2015-02-13 DIAGNOSIS — R279 Unspecified lack of coordination: Secondary | ICD-10-CM

## 2015-02-13 DIAGNOSIS — R2681 Unsteadiness on feet: Secondary | ICD-10-CM

## 2015-02-13 DIAGNOSIS — R2689 Other abnormalities of gait and mobility: Secondary | ICD-10-CM

## 2015-02-13 NOTE — Therapy (Signed)
[facility] [address] [place], [place], [address] Phone: [phone]   Fax:  [phone]  Physical Therapy Treatment  Patient Details  Name: Jeffrey [name] MRN: [id number] Date of Birth: 26-May-1940 Referring Provider: Leonie Man  Encounter Date: 02/13/2015      PT End of Session - 02/13/15 1021    Visit Number 9   Number of Visits 17   Date for PT Re-Evaluation 03/08/15   Authorization Type Blue Medicare HMO-G- code every 10th visit   PT Start Time 1015   PT Stop Time 1100   PT Time Calculation (min) 45 min   Activity Tolerance Patient tolerated treatment well   Behavior During Therapy [facility] for tasks assessed/performed      Past Medical History  Diagnosis Date  . CAD (coronary artery disease)     PCI distal RCA ...2004, residual 70% LAD   /   ...nuclear...03/2007...no ischemia.Marland KitchenMarland Kitchenpreserved LV /  nuclear...03/03/2009...inferior scar..no ischemia..EF 51%  . Dyslipidemia     takes Atorvastatin daily  . Internal hemorrhoids   . Gout     takes Allopurinol daily and Colchicine as needed  . Seasonal allergic rhinitis   . Right carotid bruit   . Cyst of nasopharynx     per ENT Wilburn Cornelia  . Myocardial infarction [facility]) 72yr ago  . Peripheral edema     takes Lasix daily  . HTN (hypertension)     takes Cardura,Metoprolol,[name],and Amlodipine daily  . Arthritis   . GERD (gastroesophageal reflux disease)     takes Omeprazole daily  . History of colon polyps   . Type 2 diabetes, uncontrolled, with neuropathy ([place] 1989    takes Invokana daily and has an insulin pump ([name]  . 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 HCAP (healthcare-associated pneumonia)   . Hemiparesis affecting right side as late effect of cerebrovascular accident ([place] 09/05/2014  . Anterior cerebral circulation hemorrhagic infarction ([facility] 11/24/2014    March, 2016, dominant left thalamic and  left internal capsule ischemic infarct with resultant hemorrhagic transformation, secondary to small vessel disease. Resulted in right hemiparesis dysarthria and diplopia   //   readmission with aphasia July, 2016, this improved     Past Surgical History  Procedure Laterality Date  . Cataract extraction Bilateral 2010  . Elbow surgery Right 1997  . Balloon angioplasty, artery  1992, 2004    CAD, Dr. KRon Parker . Colonoscopy  04/03/2002    adenomatous polyp, int hemorrhoids  . Colonoscopy  07/18/2007    normal (Dr. SFuller Plan  . Rotator cuff repair  09/2011    left, with subacromial decompression  . Nasal septum surgery    . Colonoscopy  09/26/2012    tubular adenoma, sm int hem, rpt 5 yrs (Fuller Plan  . Tonsillectomy and adenoidectomy    . Eye lids raised    . Cardiac catheterization  2004  . Polypectomy N/A 05/27/2013    Procedure: ENDOSCOPIC NASOPHARYNGEAL MASS;  Surgeon: DJerrell Belfast MD    There were no vitals filed for this visit.  Visit Diagnosis:  Abnormality of gait  Unsteadiness  Decreased functional mobility  Hemiparesis affecting right side as late effect of cerebrovascular accident ([facility]      Subjective Assessment - 02/13/15 1019    Subjective Continues to report walking more at home with daughter and some with wife.  Reports no changes since last visit, no falls.    Patient is accompained by: Family  member   Patient Stated Goals Pt's goal for therapy is to avoid losing the strength he has regained since March.  He would like to be mobile without walker.   Currently in Pain? No/denies              NMR:  Performed closed chain R LE strengthening keeping R LE in slight squat position while LLE tapped to two varying targets to provide increased WB and proprioception through RLE.  Requires max A a times to prevent LOB and assist at R knee to ensure maintaining in correct position.  Progressed to vice versa in which LLE was stable and RLE tapping to targets to address  coordination.  Pt with marked improvement in getting to target with improved control during this session.  Min A required when tapping RLE.  Transitioned to tall kneeling tasks side stepping R and L for length of mat x 4 reps down and back with assist for positioning of RLE (to prevent ankle/foot injury) and facilitation and cues for increased R glute activation to maintain hip in neutral vs retracted position.  Tolerated well.  Progressed to transitions from tall kneeling to L half kneeling, again to address increased RLE WB for proprioceptive feedback and increased proximal stabilization in RLE.  Tolerated well.  Ended session with practicing floor transfer in case of fall.  Pt able to perform at min/mod A level and feel that he would benefit from continued practice as daughter/wife concerned on how he would do this at home in case of real fall.    Therex: Provided single closed chain strengthening with squats, see pt instruction.  Provided education for safety at home when doing exercises.                    PT Education - 02/13/15 1020    Education provided Yes   Education Details addition to [employer]) Educated Patient   Methods Explanation   Comprehension Verbalized understanding          PT Short Term Goals - 02/03/15 1324    PT SHORT TERM GOAL #1   Title Pt will perform HEP with family assistance/supervision, for lower extremitiy strengthening.  TARGET 02/06/15   Time 4   Period Weeks   Status Achieved   PT SHORT TERM GOAL #2   Title Pt will perform sit<>stand transfers with supervision, for improved efficiency and safety with transfers.   Baseline Met 11/22.   Time 4   Period Weeks   Status Achieved   PT SHORT TERM GOAL #3   Title Pt will improve TUG score to less than or equal to 70 seconds for decreased fall risk.   Baseline 52.62 seconds with R AFO and heel wedge;73 seconds without AFO   Time 4   Period Weeks   Status Achieved   PT SHORT TERM GOAL #4    Title Pt will ambulate at least 150 ft using least restrictive assistive device with minimal assistance for improved gait efficiency.   Baseline Met 11/22.   Time 4   Period Weeks   Status Achieved   PT SHORT TERM GOAL #5   Title Pt will verbalize understanding of CVA education.   Time 4   Period Weeks   Status Achieved           PT Long Term Goals - 01/08/15 1311    PT LONG TERM GOAL #1   Title Pt will verbalize understanding of fall prevention within  home environment.  TARGET 03/08/15   Time 8   Period Weeks   Status New   PT LONG TERM GOAL #2   Title Pt will improve Timed Up and GO score to less than or equal to 50 seconds for decreased fall risk.   Time 8   Period Weeks   Status New   PT LONG TERM GOAL #3   Title Pt will improve gait velocity to at least 0.8 ft/sec for improved gait efficiency and safety.   Time 8   Period Weeks   Status New   PT LONG TERM GOAL #4   Title Berg Balance score to be assessed, with pt improving Berg score by at least 8 points.   Time 8   Period Weeks   Status New   PT LONG TERM GOAL #5   Title Pt will ambulate at least 250 ft. using least restrictive assistive device with supervision for improved efficiency and safety with gait.   Time 8   Period Weeks   Additional Long Term Goals   Additional Long Term Goals Yes   PT LONG TERM GOAL #6   Title Pt will verbalize plans for continued community fitness upon D/C from PT.   Time 8   Period Weeks   Status New               Plan - 02/13/15 1021    Clinical Impression Statement Skilled session focused on more RLE closed chain tasks for increased NMR with WB activities, coorindation in RLE, and practicing floor transfer.     Pt will benefit from skilled therapeutic intervention in order to improve on the following deficits Abnormal gait;Decreased balance;Decreased mobility;Decreased coordination;Decreased strength;Difficulty walking;Impaired sensation;Impaired tone   Rehab  Potential Good   PT Frequency 2x / week   PT Duration 8 weeks  plus eval   PT Treatment/Interventions ADLs/Self Care Home Management;Therapeutic exercise;Therapeutic activities;Functional mobility training;Gait training;Balance training;Neuromuscular re-education;Electrical Stimulation   PT Next Visit [place]!  Continue to address RLE WB, NMR and coordination, gait w/ LRAD, floor transfer   Consulted and Agree with Plan of Care Patient;Family member/caregiver   Family Member Consulted Daughter, and wife        Problem List Patient Active Problem List   Diagnosis Date Noted  . Anterior cerebral circulation hemorrhagic infarction ([place]) 11/24/2014  . Hemiparesis affecting right side as late effect of cerebrovascular accident ([place]) 09/05/2014  . Depression due to stroke ([place]) 09/05/2014  . Thrombocytopenia ([place]) 06/05/2014  . Chronic diastolic heart failure, NYHA class 1 ([place]) 06/05/2014  . Ex-smoker 06/03/2014  . Obesity 06/03/2014  . Benign paroxysmal positional vertigo 04/28/2014  . Right carotid bruit   . Polyneuropathy, diabetic ([place]) 08/05/2012  . Seasonal allergic rhinitis   . Trigger thumb of left hand 10/12/2011  . Gout   . Adenomatous polyps   . CAD (coronary artery disease)   . Essential hypertension   . Hyperlipidemia   . Type 2 diabetes, uncontrolled, with neuropathy ([place])   . Ejection fraction     Cameron Sprang, PT, MPT [facility] [address] [place], [place], [address] Phone: [phone]   Fax:  [phone] 02/13/2015, 11:16 AM  Name: Jeffrey [name] MRN: [id number] Date of Birth: 20-Aug-1940

## 2015-02-13 NOTE — Therapy (Signed)
[facility] [address] [place], [place], [address] Phone: [phone]   Fax:  [phone]  Occupational Therapy Treatment  Patient Details  Name: Jeffrey [name] MRN: [id number] Date of Birth: June 24, 1940 Referring Provider: Dr. Leonie Man  Encounter Date: 02/13/2015      OT End of Session - 02/13/15 1052    Visit Number 8   Number of Visits 17   Date for OT Re-Evaluation 03/13/15   Authorization Type Blue Medicare HMO, no visit limit/no auth, G-code   Authorization - Visit Number 8   Authorization - Number of Visits 10   OT Start Time 0935   OT Stop Time 1017   OT Time Calculation (min) 42 min   Activity Tolerance Patient tolerated treatment well   Behavior During Therapy [facility] for tasks assessed/performed      Past Medical History  Diagnosis Date  . CAD (coronary artery disease)     PCI distal RCA ...2004, residual 70% LAD   /   ...nuclear...03/2007...no ischemia.Marland KitchenMarland Kitchenpreserved LV /  nuclear...03/03/2009...inferior scar..no ischemia..EF 51%  . Dyslipidemia     takes Atorvastatin daily  . Internal hemorrhoids   . Gout     takes Allopurinol daily and Colchicine as needed  . Seasonal allergic rhinitis   . Right carotid bruit   . Cyst of nasopharynx     per ENT Wilburn Cornelia  . Myocardial infarction [facility]) 40yr ago  . Peripheral edema     takes Lasix daily  . HTN (hypertension)     takes Cardura,Metoprolol,[name],and Amlodipine daily  . Arthritis   . GERD (gastroesophageal reflux disease)     takes Omeprazole daily  . History of colon polyps   . Type 2 diabetes, uncontrolled, with neuropathy ([place] 1989    takes Invokana daily and has an insulin pump ([name]  . 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 HCAP (healthcare-associated pneumonia)   . Hemiparesis affecting right side as late effect of cerebrovascular accident ([place] 09/05/2014  . Anterior cerebral  circulation hemorrhagic infarction ([facility] 11/24/2014    March, 2016, dominant left thalamic and left internal capsule ischemic infarct with resultant hemorrhagic transformation, secondary to small vessel disease. Resulted in right hemiparesis dysarthria and diplopia   //   readmission with aphasia July, 2016, this improved     Past Surgical History  Procedure Laterality Date  . Cataract extraction Bilateral 2010  . Elbow surgery Right 1997  . Balloon angioplasty, artery  1992, 2004    CAD, Dr. KRon Parker . Colonoscopy  04/03/2002    adenomatous polyp, int hemorrhoids  . Colonoscopy  07/18/2007    normal (Dr. SFuller Plan  . Rotator cuff repair  09/2011    left, with subacromial decompression  . Nasal septum surgery    . Colonoscopy  09/26/2012    tubular adenoma, sm int hem, rpt 5 yrs (Fuller Plan  . Tonsillectomy and adenoidectomy    . Eye lids raised    . Cardiac catheterization  2004  . Polypectomy N/A 05/27/2013    Procedure: ENDOSCOPIC NASOPHARYNGEAL MASS;  Surgeon: DJerrell Belfast MD    There were no vitals filed for this visit.  Visit Diagnosis:  Right hemiparesis ([place]  Decreased coordination  Stiffness of right shoulder joint      Subjective Assessment - 02/13/15 0941    Subjective  pt states he's feeling pretty good today    Patient is accompained by: --  wife and daughter assisted patient  into gym   Patient Stated Goals utilization of right arm "I wish I had more control of it"   Currently in Pain? No/denies      Patient started out on upper extremity bike, during this focused on NMR; therapist facilitated movement and decreased patient's ability to compensate with RUE.  Pt then engaged in therapeutic activity focusing on right hand coordination, control, and functional use. Pt worked on grasping yellow clips and reaching to clip them onto pole. Max difficulty with this task, pt required hand over hand to complete. Rest breaks taken 3 times during this activity. Max cueing needed  to keep shoulder depressed, for patient not to compensate.   Pt then worked on placing medium pegs into peg board. This also was max difficulty for patient. Pt required hand over hand assist for this as well. Therapist strongly encouraged patient to focus on extending fingers for a big movement of release, grasp the pegs, then perform another big movement of release. Encouraged patient to work on this at home when picking up items with his right hand. Pt required multiple rest breaks and again max cueing to keep shoulder depressed.                        OT Education - 02/13/15 1045    Education provided Yes   Education Details encouraged patient to work on grasp and release, making big movements   Person(s) Educated Patient   Methods Explanation;Demonstration   Comprehension Verbalized understanding;Returned demonstration          OT Short Term Goals - 02/13/15 1050    OT SHORT TERM GOAL #1   Title Pt will be independent with initial HEP.--check STGs 02/10/15   Time 4   Period Weeks   Status Partially Met   OT SHORT TERM GOAL #2   Title Pt will use RUE as gross A/stabilizer at least 25% of the time for ADLs.   Time 4   Period Weeks   Status On-going   OT SHORT TERM GOAL #3   Title Pt will demo at least 55* R shoulder flex with min compensation in prep for functional reach.   Baseline 45*   Time 4   Period Weeks   Status Not Met   OT SHORT TERM GOAL #4   Title Pt will be able to complete clothing fasteners (buttons, tying shoes) mod I using AE prn.   Time 4   Period Weeks   Status On-going           OT Long Term Goals - 02/13/15 1051    OT LONG TERM GOAL #1   Title Pt will be independent with updated HEP.--check LTGs 03/13/15   Time 8   Period Weeks   Status On-going   OT LONG TERM GOAL #2   Title Pt will use RUE as gross A/stabilizer for ADLs at least 50% of the time.   Time 8   Period Weeks   Status On-going   OT LONG TERM GOAL #3   Title Pt  will demo at least 65* R shoulder flex with min compensation in prep for functional reach.   Baseline 45*   Time 8   Period Weeks   Status On-going   OT LONG TERM GOAL #4   Title Pt will improve RUE functional reaching/coordination for ADLs as shown by improving score on box and blocks test to at least 15 blocks.   Baseline 9 blocks  Time 8   Period Weeks   Status On-going   OT LONG TERM GOAL #5   Title Pt will perform bathing with supervision.   Time 8   Period Weeks   Status On-going   OT LONG TERM GOAL #6   Title Pt will verbalize understanding of cognitive/visual compensation strategies and activities to improve cognition/vision prn.   Time 8   Period Weeks   Status On-going               Plan - 02/13/15 1045    Clinical Impression Statement Pt continues to progress towards goals for RUE, pt is very motivated to increase his coordination and control of right hand.    Pt will benefit from skilled therapeutic intervention in order to improve on the following deficits (Retired) Decreased cognition;Decreased mobility;Decreased strength;Impaired vision/preception;Impaired UE functional use;Decreased knowledge of use of DME;Decreased balance;Impaired tone;Impaired sensation;Decreased coordination;Decreased range of motion   Rehab Potential Good   Clinical Impairments Affecting Rehab Potential severity of deficits, sensory deficits   OT Frequency 2x / week   OT Duration 8 weeks   OT Treatment/Interventions Self-care/ADL training;Therapeutic exercise;Functional Mobility Training;Patient/family education;Ultrasound;Neuromuscular education;Manual Therapy;Splinting;Therapeutic exercises;Cryotherapy;Parrafin;DME and/or AE instruction;Therapeutic activities;Cognitive remediation/compensation;Visual/perceptual remediation/compensation;Passive range of motion;Moist Heat;Contrast Bath;Fluidtherapy;Electrical Stimulation   Plan continue NMR and functional use of RUE   Consulted and Agree  with Plan of Care Patient        Problem List Patient Active Problem List   Diagnosis Date Noted  . Anterior cerebral circulation hemorrhagic infarction ([place]) 11/24/2014  . Hemiparesis affecting right side as late effect of cerebrovascular accident ([place]) 09/05/2014  . Depression due to stroke ([place]) 09/05/2014  . Thrombocytopenia ([place]) 06/05/2014  . Chronic diastolic heart failure, NYHA class 1 ([place]) 06/05/2014  . Ex-smoker 06/03/2014  . Obesity 06/03/2014  . Benign paroxysmal positional vertigo 04/28/2014  . Right carotid bruit   . Polyneuropathy, diabetic ([place]) 08/05/2012  . Seasonal allergic rhinitis   . Trigger thumb of left hand 10/12/2011  . Gout   . Adenomatous polyps   . CAD (coronary artery disease)   . Essential hypertension   . Hyperlipidemia   . Type 2 diabetes, uncontrolled, with neuropathy ([place])   . Ejection fraction     Bettymae Yott , MS, OTR/L, CLT Pager: [phone]  02/13/2015, 10:54 AM  [facility] [address] [place], [place], [address] Phone: [phone]   Fax:  [phone]  Name: GIANCARLOS BERENDT MRN: [id number] Date of Birth: 1941-02-13

## 2015-02-13 NOTE — Patient Instructions (Signed)
Functional Quadriceps: Chair Squat    Keeping feet flat on floor, shoulder width apart, squat as low as is comfortable. Use support as necessary.  For example, have counter top in front of you and w/c placed behind you for safety.  Make sure that you squat like you are going to sit, but don't sit.  Stick your bottom out and make sure that your knees to not go over your toes.   Repeat __10__ times per set. Do _1___ sets per session. Do __1__ sessions per day.  http://orth.exer.us/736   Copyright  VHI. All rights reserved.

## 2015-02-18 ENCOUNTER — Ambulatory Visit: Payer: Medicare Other | Admitting: Occupational Therapy

## 2015-02-18 ENCOUNTER — Ambulatory Visit: Payer: Medicare Other | Admitting: Rehabilitation

## 2015-02-18 ENCOUNTER — Encounter: Payer: Self-pay | Admitting: Occupational Therapy

## 2015-02-18 ENCOUNTER — Encounter: Payer: Self-pay | Admitting: Rehabilitation

## 2015-02-18 DIAGNOSIS — G8191 Hemiplegia, unspecified affecting right dominant side: Secondary | ICD-10-CM

## 2015-02-18 DIAGNOSIS — R269 Unspecified abnormalities of gait and mobility: Secondary | ICD-10-CM

## 2015-02-18 DIAGNOSIS — R278 Other lack of coordination: Secondary | ICD-10-CM

## 2015-02-18 DIAGNOSIS — R2689 Other abnormalities of gait and mobility: Secondary | ICD-10-CM

## 2015-02-18 DIAGNOSIS — M6281 Muscle weakness (generalized): Secondary | ICD-10-CM | POA: Diagnosis not present

## 2015-02-18 DIAGNOSIS — R2681 Unsteadiness on feet: Secondary | ICD-10-CM

## 2015-02-18 DIAGNOSIS — R279 Unspecified lack of coordination: Secondary | ICD-10-CM

## 2015-02-18 DIAGNOSIS — M25611 Stiffness of right shoulder, not elsewhere classified: Secondary | ICD-10-CM

## 2015-02-18 NOTE — Therapy (Signed)
[facility] [address] [place], [place], [address] Phone: [phone]   Fax:  [phone]  Occupational Therapy Treatment  Patient Details  Name: Jeffrey [name] MRN: [id number] Date of Birth: January 09, 1941 Referring Provider: Dr. Leonie Man  Encounter Date: 02/18/2015      OT End of Session - 02/18/15 1728    Visit Number 9   Number of Visits 17   Date for OT Re-Evaluation 03/13/15   Authorization Type [employer] HMO, no visit limit/no auth, G-code   Authorization - Visit Number 9   Authorization - Number of Visits 10   OT Start Time 9381   OT Stop Time 1447   OT Time Calculation (min) 44 min   Activity Tolerance Patient tolerated treatment well   Behavior During Therapy [facility] for tasks assessed/performed      Past Medical History  Diagnosis Date  . CAD (coronary artery disease)     PCI distal RCA ...2004, residual 70% LAD   /   ...nuclear...03/2007...no ischemia.Marland KitchenMarland Kitchenpreserved LV /  nuclear...03/03/2009...inferior scar..no ischemia..EF 51%  . Dyslipidemia     takes Atorvastatin daily  . Internal hemorrhoids   . Gout     takes Allopurinol daily and Colchicine as needed  . Seasonal allergic rhinitis   . Right carotid bruit   . Cyst of nasopharynx     per ENT Wilburn Cornelia  . Myocardial infarction [facility]) 46yr ago  . Peripheral edema     takes Lasix daily  . HTN (hypertension)     takes Cardura,Metoprolol,[name],and Amlodipine daily  . Arthritis   . GERD (gastroesophageal reflux disease)     takes Omeprazole daily  . History of colon polyps   . Type 2 diabetes, uncontrolled, with neuropathy ([place] 1989    takes Invokana daily and has an insulin pump ([name]  . 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 HCAP (healthcare-associated pneumonia)   . Hemiparesis affecting right side as late effect of [place]vascular [place] 09/05[place]2014  . Anterior cerebral  circulation hemorrhagic infarction ([facility] 11/24/2014    March, 2016, dominant left thalamic and left internal capsule ischemic infarct with resultant hemorrhagic transformation, secondary to small vessel disease. Resulted in right hemiparesis dysarthria and diplopia   //   readmission with aphasia July, 2016, this improved     Past Surgical History  Procedure Laterality Date  . Cataract extraction Bilateral 2010  . Elbow surgery Right 1997  . Balloon angioplasty, artery  1992, 2004    CAD, Dr. KRon Parker . Colonoscopy  04/03/2002    adenomatous polyp, int hemorrhoids  . Colonoscopy  07/18/2007    normal (Dr. SFuller Plan  . Rotator cuff repair  09/2011    left, with subacromial decompression  . Nasal septum surgery    . Colonoscopy  09/26/2012    tubular adenoma, sm int hem, rpt 5 yrs (Fuller Plan  . Tonsillectomy and adenoidectomy    . Eye lids raised    . Cardiac catheterization  2004  . Polypectomy N/A 05/27/2013    Procedure: ENDOSCOPIC NASOPHARYNGEAL MASS;  Surgeon: DJerrell Belfast MD    There were no vitals filed for this visit.  Visit Diagnosis:  Stiffness of right shoulder joint  Decreased coordination  Right hemiparesis ([facility]      Subjective Assessment - 02/18/15 1705    Subjective  Patient states he has trouble using his right hand    Patient Stated Goals utilization of right arm "I wish  I had more control of it"   Currently in Pain? No/denies   Multiple Pain Sites No                      OT Treatments/Exercises (OP) - 02/18/15 0001    ADLs   UB Dressing Encouraged patient to begin to use right hand for bilateral tasks.  Patient able with increased time to zip jacket using both hands effectively.      LB Dressing Patient needs to use vision to compensate for decreased sensation in right hand, but with verbal cueing, and set up to allow unobstructed vision, patient able to tie shoe using bimanual technique.  Patient encouraged to practice this in his lap, to allow  practice of this motot pattern   Neurological Re-education Exercises   Other Exercises 1 Neuro reeducation to address grading of muscle activity, and muscle balance for reach patterns.  Patient with strong overactivation in shoulder limiting forward flexion, and smooth controlled motion at [facility] joint.  Patient with significant sensory loss which impedes performance with muscle grading.     Other Exercises 2 Weight bearing supine to sidelying, long arm sidesitting, and up to quadruped to increase weight through right extremities.  Patient with reduced muscle tension immediately following weight bearing, and able to complete reaching activity in modified closed chain.  Worked on initial interlimb coordination for distal right arm.                   OT Education - 02/18/15 1727    Education provided Yes   Education Details Reach pattern to decrease overuse of shoulder elevation   Person(s) Educated Patient   Methods Explanation;Demonstration;Tactile cues;Verbal cues   Comprehension Verbalized understanding;Verbal cues required;Tactile cues required;Need further instruction          OT Short Term Goals - 02/13/15 1050    OT SHORT TERM GOAL #1   Title Pt will be independent with initial HEP.--check STGs 02/10/15   Time 4   Period Weeks   Status Partially Met   OT SHORT TERM GOAL #2   Title Pt will use RUE as gross A/stabilizer at least 25% of the time for ADLs.   Time 4   Period Weeks   Status On-going   OT SHORT TERM GOAL #3   Title Pt will demo at least 55* R shoulder flex with min compensation in prep for functional reach.   Baseline 45*   Time 4   Period Weeks   Status Not Met   OT SHORT TERM GOAL #4   Title Pt will be able to complete clothing fasteners (buttons, tying shoes) mod I using AE prn.   Time 4   Period Weeks   Status On-going           OT Long Term Goals - 02/13/15 1051    OT LONG TERM GOAL #1   Title Pt will be independent with updated HEP.--check LTGs  03/13/15   Time 8   Period Weeks   Status On-going   OT LONG TERM GOAL #2   Title Pt will use RUE as gross A/stabilizer for ADLs at least 50% of the time.   Time 8   Period Weeks   Status On-going   OT LONG TERM GOAL #3   Title Pt will demo at least 65* R shoulder flex with min compensation in prep for functional reach.   Baseline 45*   Time 8   Period Weeks  Status On-going   OT LONG TERM GOAL #4   Title Pt will improve RUE functional reaching/coordination for ADLs as shown by improving score on box and blocks test to at least 15 blocks.   Baseline 9 blocks   Time 8   Period Weeks   Status On-going   OT LONG TERM GOAL #5   Title Pt will perform bathing with supervision.   Time 8   Period Weeks   Status On-going   OT LONG TERM GOAL #6   Title Pt will verbalize understanding of cognitive/visual compensation strategies and activities to improve cognition/vision prn.   Time 8   Period Weeks   Status On-going               Plan - 02/18/15 1728    Clinical Impression Statement Patient is progressing toward OT goals, and is very motiavted for improved functional use of right UE.   Pt will benefit from skilled therapeutic intervention in order to improve on the following deficits (Retired) Decreased cognition;Decreased mobility;Decreased strength;Impaired vision/preception;Impaired UE functional use;Decreased knowledge of use of DME;Decreased balance;Impaired tone;Impaired sensation;Decreased coordination;Decreased range of motion   Rehab Potential Good   Clinical Impairments Affecting Rehab Potential severity of deficits, sensory deficits   OT Frequency 2x / week   OT Duration 8 weeks   OT Treatment/Interventions Self-care/ADL training;Therapeutic exercise;Functional Mobility Training;Patient/family education;Ultrasound;Neuromuscular education;Manual Therapy;Splinting;Therapeutic exercises;Cryotherapy;Parrafin;DME and/or AE instruction;Therapeutic activities;Cognitive  remediation/compensation;Visual/perceptual remediation/compensation;Passive range of motion;Moist Heat;Contrast Bath;Fluidtherapy;Printmaker   Plan nmr rue, functional use of right UE - GCODE   Consulted and Agree with Plan of Care Patient        Problem List Patient Active Problem List   Diagnosis Date Noted  . Anterior cerebral circulation hemorrhagic infarction ([place]) 11/24/2014  . Hemiparesis affecting right side as late effect of cerebrovascular accident ([place]) 09/05/2014  . Depression due to stroke ([place]) 09/05/2014  . Thrombocytopenia ([place]) 06/05/2014  . Chronic diastolic heart failure, NYHA class 1 ([place]) 06/05/2014  . Ex-smoker 06/03/2014  . Obesity 06/03/2014  . Benign paroxysmal positional vertigo 04/28/2014  . Right carotid bruit   . Polyneuropathy, diabetic ([place]) 08/05/2012  . Seasonal allergic rhinitis   . Trigger thumb of left hand 10/12/2011  . Gout   . Adenomatous polyps   . CAD (coronary artery disease)   . Essential hypertension   . Hyperlipidemia   . Type 2 diabetes, uncontrolled, with neuropathy ([place])   . Ejection fraction     Mariah Milling, OTR/L 02/18/2015, 5:31 PM  [facility] [address] [place], [place], [address] Phone: [phone]   Fax:  [phone]  Name: Jeffrey [name] MRN: [id number] Date of Birth: 1940/12/05

## 2015-02-18 NOTE — Therapy (Signed)
[facility] [address] [place], [place], [address] Phone: [phone]   Fax:  [phone]  Physical Therapy Treatment  Patient Details  Name: Jeffrey [name] MRN: [id number] Date of Birth: 05-27-1940 Referring Provider: Leonie Man  Encounter Date: 02/18/2015      PT End of Session - 02/18/15 1353    Visit Number 10   Number of Visits 17   Date for PT Re-Evaluation 03/08/15   Authorization Type Blue Medicare HMO-G- code every 10th visit   PT Start Time 1318   PT Stop Time 1400   PT Time Calculation (min) 42 min   Activity Tolerance Patient tolerated treatment well   Behavior During Therapy [facility] for tasks assessed/performed      Past Medical History  Diagnosis Date  . CAD (coronary artery disease)     PCI distal RCA ...2004, residual 70% LAD   /   ...nuclear...03/2007...no ischemia.Marland KitchenMarland Kitchenpreserved LV /  nuclear...03/03/2009...inferior scar..no ischemia..EF 51%  . Dyslipidemia     takes Atorvastatin daily  . Internal hemorrhoids   . Gout     takes Allopurinol daily and Colchicine as needed  . Seasonal allergic rhinitis   . Right carotid bruit   . Cyst of nasopharynx     per ENT Wilburn Cornelia  . Myocardial infarction [facility]) 33yr ago  . Peripheral edema     takes Lasix daily  . HTN (hypertension)     takes Cardura,Metoprolol,[name],and Amlodipine daily  . Arthritis   . GERD (gastroesophageal reflux disease)     takes Omeprazole daily  . History of colon polyps   . Type 2 diabetes, uncontrolled, with neuropathy ([place] 1989    takes Invokana daily and has an insulin pump ([name]  . 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 HCAP (healthcare-associated pneumonia)   . Hemiparesis affecting right side as late effect of cerebrovascular accident ([place] 09/05/2014  . Anterior cerebral circulation hemorrhagic infarction ([facility] 11/24/2014    March, 2016, dominant left thalamic and  left internal capsule ischemic infarct with resultant hemorrhagic transformation, secondary to small vessel disease. Resulted in right hemiparesis dysarthria and diplopia   //   readmission with aphasia July, 2016, this improved     Past Surgical History  Procedure Laterality Date  . Cataract extraction Bilateral 2010  . Elbow surgery Right 1997  . Balloon angioplasty, artery  1992, 2004    CAD, Dr. KRon Parker . Colonoscopy  04/03/2002    adenomatous polyp, int hemorrhoids  . Colonoscopy  07/18/2007    normal (Dr. SFuller Plan  . Rotator cuff repair  09/2011    left, with subacromial decompression  . Nasal septum surgery    . Colonoscopy  09/26/2012    tubular adenoma, sm int hem, rpt 5 yrs (Fuller Plan  . Tonsillectomy and adenoidectomy    . Eye lids raised    . Cardiac catheterization  2004  . Polypectomy N/A 05/27/2013    Procedure: ENDOSCOPIC NASOPHARYNGEAL MASS;  Surgeon: DJerrell Belfast MD    There were no vitals filed for this visit.  Visit Diagnosis:  Abnormality of gait  Unsteadiness  Decreased coordination  Decreased functional mobility  Muscle weakness of lower extremity      Subjective Assessment - 02/18/15 1352    Subjective Reports got his personal brace from [employer] yesterday and is going well with walking.    Patient is accompained by: Family member   Patient Stated Goals Pt's goal for therapy is  to avoid losing the strength he has regained since March.  He would like to be mobile without walker.   Currently in Pain? No/denies             Gait:  Assessed gait with use of RW and his new personal R reaction AFO with PT making addition of large heel wedge to avoid over extension in R knee during stance.  Also wanted to decrease amount of bracing pt needing to wear and avoiding having to wear knee cage.  Performed 115' x 1 with RW (equipement as above) at S level.  Pt with marked improvement in gait pattern and note great attention to RUE during gait to ensure proper  placement on RW.  Discussed that pt could walk at S level at home without family following with w/c.  Pt verbalized understanding.  Also assessed TUG for progress report purposes and note that pt has improved by 34.44 seconds, demonstrating marked improvement in balance and decreased fall risk.  Initiated use of quad tip cane during session with R AFO x 115' at min A level.  Note that during straight paths, pt able to improve gait quality with improved reciprocal pattern, however demonstrates increased difficulty when turning due to ataxia in RLE and slower gait speed required to complete task.    Self Care:  Discussed compliance with HEP during session as well as clearance to ambulate with RW and brace at S level and that family did not need to follow with w/c anymore.  Pt verbalized understanding.                     PT Education - 02/18/15 1352    Education provided Yes   Education Details Continue to educate on HEP compliance.    Person(s) Educated Patient   Methods Explanation   Comprehension Verbalized understanding          PT Short Term Goals - 02/03/15 1324    PT SHORT TERM GOAL #1   Title Pt will perform HEP with family assistance/supervision, for lower extremitiy strengthening.  TARGET 02/06/15   Time 4   Period Weeks   Status Achieved   PT SHORT TERM GOAL #2   Title Pt will perform sit<>stand transfers with supervision, for improved efficiency and safety with transfers.   Baseline Met 11/22.   Time 4   Period Weeks   Status Achieved   PT SHORT TERM GOAL #3   Title Pt will improve TUG score to less than or equal to 70 seconds for decreased fall risk.   Baseline 52.62 seconds with R AFO and heel wedge;73 seconds without AFO   Time 4   Period Weeks   Status Achieved   PT SHORT TERM GOAL #4   Title Pt will ambulate at least 150 ft using least restrictive assistive device with minimal assistance for improved gait efficiency.   Baseline Met 11/22.   Time 4    Period Weeks   Status Achieved   PT SHORT TERM GOAL #5   Title Pt will verbalize understanding of CVA education.   Time 4   Period Weeks   Status Achieved           PT Long Term Goals - 01/08/15 1311    PT LONG TERM GOAL #1   Title Pt will verbalize understanding of fall prevention within home environment.  TARGET 03/08/15   Time 8   Period Weeks   Status New   PT LONG TERM  GOAL #2   Title Pt will improve Timed Up and GO score to less than or equal to 50 seconds for decreased fall risk.   Time 8   Period Weeks   Status New   PT LONG TERM GOAL #3   Title Pt will improve gait velocity to at least 0.8 ft/sec for improved gait efficiency and safety.   Time 8   Period Weeks   Status New   PT LONG TERM GOAL #4   Title Berg Balance score to be assessed, with pt improving Berg score by at least 8 points.   Time 8   Period Weeks   Status New   PT LONG TERM GOAL #5   Title Pt will ambulate at least 250 ft. using least restrictive assistive device with supervision for improved efficiency and safety with gait.   Time 8   Period Weeks   Additional Long Term Goals   Additional Long Term Goals Yes   PT LONG TERM GOAL #6   Title Pt will verbalize plans for continued community fitness upon D/C from PT.   Time 8   Period Weeks   Status New               Plan - 02/24/2015 1353    Clinical Impression Statement Skilled session focused on gait with use of RW, pts personal R reaction AFO with added large heel wedge to prevent over extension of R knee during stance, assessment of TUG, and initiation of gait with use of quad tip cane during session.    Pt will benefit from skilled therapeutic intervention in order to improve on the following deficits Abnormal gait;Decreased balance;Decreased mobility;Decreased coordination;Decreased strength;Difficulty walking;Impaired sensation;Impaired tone   Rehab Potential Good   PT Frequency 2x / week   PT Duration 8 weeks  plus eval   PT  Treatment/Interventions ADLs/Self Care Home Management;Therapeutic exercise;Therapeutic activities;Functional mobility training;Gait training;Balance training;Neuromuscular re-education;Electrical Stimulation   PT Next Visit Plan Continue to address RLE WB, NMR and coordination, gait w/ LRAD   Consulted and Agree with Plan of Care Patient;Family member/caregiver   Family Member Consulted Daughter, and wife          G-Codes - 02/24/15 1755    Functional Assessment Tool Used sit<>stand S, gait with RW (without platform) at S level, TUG 49.44 seconds   Functional Limitation Mobility: Walking and moving around   Mobility: Walking and Moving Around Current Status [phone]) At least 40 percent but less than 60 percent impaired, limited or restricted   Mobility: Walking and Moving Around Goal Status [phone]) At least 20 percent but less than 40 percent impaired, limited or restricted  updated due to progress      Physical Therapy Progress Note  Dates of Reporting Period: 01/07/15 to 02/24/15  Objective Reports of Subjective Statement: See objective section above.   Objective Measurements: TUG 49.44 seconds  Goal Update: See LTG's above  Plan: continue current POC   Reason Skilled Services are Required: Pt continues to demonstrate decreased coordination in RUE/LE, decreased balance and strength deficits from CVA.  Pt very motivated to progress and note that he continues to make marked progress throughout all therapy sessions.        Problem List Patient Active Problem List   Diagnosis Date Noted  . Anterior cerebral circulation hemorrhagic infarction ([place]) 11/24/2014  . Hemiparesis affecting right side as late effect of cerebrovascular accident ([place]) 09/05/2014  . Depression due to stroke ([place]) 09/05/2014  . Thrombocytopenia ([place]) 06/05/2014  .  Chronic diastolic heart failure, NYHA class 1 ([place]) 06/05/2014  . Ex-smoker 06/03/2014  . Obesity 06/03/2014  . Benign paroxysmal positional  vertigo 04/28/2014  . Right carotid bruit   . Polyneuropathy, diabetic ([place]) 08/05/2012  . Seasonal allergic rhinitis   . Trigger thumb of left hand 10/12/2011  . Gout   . Adenomatous polyps   . CAD (coronary artery disease)   . Essential hypertension   . Hyperlipidemia   . Type 2 diabetes, uncontrolled, with neuropathy ([place])   . Ejection fraction     Cameron Sprang, PT, MPT [facility] [address] [place], [place], [address] Phone: [phone]   Fax:  [phone] 02/18/2015, 6:00 PM  Name: Jeffrey [name] MRN: [id number] Date of Birth: 1940/06/02

## 2015-02-19 ENCOUNTER — Encounter: Payer: Self-pay | Admitting: Occupational Therapy

## 2015-02-19 ENCOUNTER — Ambulatory Visit: Payer: Medicare Other | Admitting: Rehabilitation

## 2015-02-19 ENCOUNTER — Ambulatory Visit: Payer: Medicare Other | Admitting: Occupational Therapy

## 2015-02-19 ENCOUNTER — Encounter: Payer: Self-pay | Admitting: Rehabilitation

## 2015-02-19 DIAGNOSIS — R278 Other lack of coordination: Secondary | ICD-10-CM

## 2015-02-19 DIAGNOSIS — M25611 Stiffness of right shoulder, not elsewhere classified: Secondary | ICD-10-CM

## 2015-02-19 DIAGNOSIS — M6281 Muscle weakness (generalized): Secondary | ICD-10-CM | POA: Diagnosis not present

## 2015-02-19 DIAGNOSIS — R2681 Unsteadiness on feet: Secondary | ICD-10-CM

## 2015-02-19 DIAGNOSIS — I69351 Hemiplegia and hemiparesis following cerebral infarction affecting right dominant side: Secondary | ICD-10-CM

## 2015-02-19 DIAGNOSIS — R279 Unspecified lack of coordination: Principal | ICD-10-CM

## 2015-02-19 DIAGNOSIS — I69319 Unspecified symptoms and signs involving cognitive functions following cerebral infarction: Secondary | ICD-10-CM

## 2015-02-19 DIAGNOSIS — H539 Unspecified visual disturbance: Secondary | ICD-10-CM

## 2015-02-19 DIAGNOSIS — R2689 Other abnormalities of gait and mobility: Secondary | ICD-10-CM

## 2015-02-19 DIAGNOSIS — R269 Unspecified abnormalities of gait and mobility: Secondary | ICD-10-CM

## 2015-02-19 NOTE — Therapy (Signed)
[facility] [address] [place], [place], [address] Phone: [phone]   Fax:  [phone]  Physical Therapy Treatment  Patient Details  Name: Jeffrey [name] MRN: [id number] Date of Birth: [birth year] Referring Provider: Leonie Man  Encounter Date: 02/19/2015      PT End of Session - 02/19/15 1220    Visit Number 11   Number of Visits 17   Date for PT Re-Evaluation 03/08/15   Authorization Type Blue Medicare HMO-G- code every 10th visit   PT Start Time 1015   PT Stop Time 1100   PT Time Calculation (min) 45 min   Activity Tolerance Patient tolerated treatment well   Behavior During Therapy [facility] for tasks assessed/performed      Past Medical History  Diagnosis Date  . CAD (coronary artery disease)     PCI distal RCA ...2004, residual 70% LAD   /   ...nuclear...03/2007...no ischemia.Marland KitchenMarland Kitchenpreserved LV /  nuclear...03/03/2009...inferior scar..no ischemia..EF 51%  . Dyslipidemia     takes Atorvastatin daily  . Internal hemorrhoids   . Gout     takes Allopurinol daily and Colchicine as needed  . Seasonal allergic rhinitis   . Right carotid bruit   . Cyst of nasopharynx     per ENT Wilburn Cornelia  . Myocardial infarction [facility]) 55yr ago  . Peripheral edema     takes Lasix daily  . HTN (hypertension)     takes Cardura,Metoprolol,[name],and Amlodipine daily  . Arthritis   . GERD (gastroesophageal reflux disease)     takes Omeprazole daily  . History of colon polyps   . Type 2 diabetes, uncontrolled, with neuropathy ([place] 1989    takes Invokana daily and has an insulin pump ([name]  . 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 HCAP (healthcare-associated pneumonia)   . Hemiparesis affecting right side as late effect of cerebrovascular accident ([place] 09/05/2014  . Anterior cerebral circulation hemorrhagic infarction ([facility] 11/24/2014    March, 2016, dominant left thalamic and  left internal capsule ischemic infarct with resultant hemorrhagic transformation, secondary to small vessel disease. Resulted in right hemiparesis dysarthria and diplopia   //   readmission with aphasia July, 2016, this improved     Past Surgical History  Procedure Laterality Date  . Cataract extraction Bilateral 2010  . Elbow surgery Right 1997  . Balloon angioplasty, artery  1992, 2004    CAD, Dr. KRon Parker . Colonoscopy  04/03/2002    adenomatous polyp, int hemorrhoids  . Colonoscopy  07/18/2007    normal (Dr. SFuller Plan  . Rotator cuff repair  09/2011    left, with subacromial decompression  . Nasal septum surgery    . Colonoscopy  09/26/2012    tubular adenoma, sm int hem, rpt 5 yrs (Fuller Plan  . Tonsillectomy and adenoidectomy    . Eye lids raised    . Cardiac catheterization  2004  . Polypectomy N/A 05/27/2013    Procedure: ENDOSCOPIC NASOPHARYNGEAL MASS;  Surgeon: DJerrell Belfast MD    There were no vitals filed for this visit.  Visit Diagnosis:  Abnormality of gait  Unsteadiness  Decreased functional mobility  Muscle weakness of lower extremity  Decreased coordination      Subjective Assessment - 02/19/15 1218    Subjective Reports feeling tired after yesterdays session and became slightly hypoglycemic, but has snacks with him today just in case.    Patient is accompained by: Family member   Patient Stated Goals  Pt's goal for therapy is to avoid losing the strength he has regained since March.  He would like to be mobile without walker.   Currently in Pain? No/denies             Self Care:  Discussed with pt and family having him walk into therapy from car from now on, walking at home without having them follow with w/c.  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  Feel that he could walk into church and up to podium with use of RW, however concerned about standing tolerance.  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  Educated him to time how long he is currently able to stand and work from there on building tolerance.  Pt verbalized understanding.    NMR:  Worked on RUE/LE NMR with WB in quadruped while reaching to target with RUE, switching hands and placing clothes pin to the R to address coordination and proprioception.  Tolerated well with tactile assist at R UE to prevent overt shoulder elevation.    Gait:  Performed 39' with use of quad tip cane and R reaction AFO at min A with cues for upright posture and stepping sequence.  Provided tactile assist at RUE to prevent over activation of arm as he tends to keep it drawn behind him or clasped in front of him.  Educated family that he is not ready for gait with cane at home yet, but would continue to work on this during therapy.                     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    Person(s) Educated Patient   Methods Explanation   Comprehension Verbalized understanding          PT Short Term Goals - 02/03/15 1324    PT SHORT TERM GOAL #1   Title Pt will perform HEP with family assistance/supervision, for lower extremitiy strengthening.  TARGET 02/06/15   Time 4   Period Weeks   Status Achieved   PT SHORT TERM GOAL #2   Title Pt will perform sit<>stand transfers with supervision, for improved efficiency and safety with transfers.   Baseline Met 11/22.   Time 4   Period Weeks   Status Achieved   PT SHORT TERM GOAL #3   Title Pt will improve TUG score to less than or equal to 70 seconds for decreased fall risk.   Baseline 52.62 seconds with R AFO and heel wedge;73 seconds without AFO   Time 4   Period Weeks   Status Achieved   PT SHORT  TERM GOAL #4   Title Pt will ambulate at least 150 ft using least restrictive assistive device with minimal assistance for improved gait efficiency.   Baseline Met 11/22.   Time 4   Period Weeks   Status Achieved   PT SHORT TERM GOAL #5   Title Pt will verbalize understanding of CVA education.   Time 4   Period Weeks   Status Achieved           PT Long Term Goals -  01/08/15 1311    PT LONG TERM GOAL #1   Title Pt will verbalize understanding of fall prevention within home environment.  TARGET 03/08/15   Time 8   Period Weeks   Status New   PT LONG TERM GOAL #2   Title Pt will improve Timed Up and GO score to less than or equal to 50 seconds for decreased fall risk.   Time 8   Period Weeks   Status New   PT LONG TERM GOAL #3   Title Pt will improve gait velocity to at least 0.8 ft/sec for improved gait efficiency and safety.   Time 8   Period Weeks   Status New   PT LONG TERM GOAL #4   Title Berg Balance score to be assessed, with pt improving Berg score by at least 8 points.   Time 8   Period Weeks   Status New   PT LONG TERM GOAL #5   Title Pt will ambulate at least 250 ft. using least restrictive assistive device with supervision for improved efficiency and safety with gait.   Time 8   Period Weeks   Additional Long Term Goals   Additional Long Term Goals Yes   PT LONG TERM GOAL #6   Title Pt will verbalize plans for continued community fitness upon D/C from PT.   Time 8   Period Weeks   Status New               Plan - 02/19/15 1220    Clinical Impression Statement Skilled session focused on NMR in quadruped for WB, prorioceptive feedback and coordination as well as continued practice gait with quad tip cane.  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    Pt will benefit from skilled therapeutic intervention in order to improve on the following deficits Abnormal gait;Decreased balance;Decreased mobility;Decreased  coordination;Decreased strength;Difficulty walking;Impaired sensation;Impaired tone   Rehab Potential Good   PT Frequency 2x / week   PT Duration 8 weeks  plus eval   PT Treatment/Interventions ADLs/Self Care Home Management;Therapeutic exercise;Therapeutic activities;Functional mobility training;Gait training;Balance training;Neuromuscular re-education;Electrical Stimulation   PT Next Visit Plan Continue to address RLE WB, NMR and coordination, gait w/ quad tip cane   Consulted and Agree with Plan of Care Patient;Family member/caregiver   Family Member Consulted Daughter, and wife          G-Codes - 03-08-2015 1755    Functional Assessment Tool Used sit<>stand S, gait with RW (without platform) at S level, TUG 49.44 seconds   Functional Limitation Mobility: Walking and moving around   Mobility: Walking and Moving Around Current Status [phone]) At least 40 percent but less than 60 percent impaired, limited or restricted   Mobility: Walking and Moving Around Goal Status [phone]) At least 20 percent but less than 40 percent impaired, limited or restricted  updated due to progress      Problem List Patient Active Problem List   Diagnosis Date Noted  . Anterior cerebral circulation hemorrhagic infarction ([place]) 11/24/2014  . Hemiparesis affecting right side as late effect of cerebrovascular accident ([place]) 09/05/2014  . Depression due to stroke ([place]) 09/05/2014  . Thrombocytopenia ([place]) 06/05/2014  . Chronic diastolic heart failure, NYHA class 1 ([place]) 06/05/2014  . Ex-smoker 06/03/2014  . Obesity 06/03/2014  . Benign paroxysmal positional vertigo 04/28/2014  . Right carotid bruit   . Polyneuropathy, diabetic ([place]) 08/05/2012  . Seasonal allergic rhinitis   . Trigger thumb of left  hand 10/12/2011  . Gout   . Adenomatous polyps   . CAD (coronary artery disease)   . Essential hypertension   . Hyperlipidemia   . Type 2 diabetes, uncontrolled, with neuropathy ([place])   . Ejection fraction      Cameron Sprang, PT, MPT [facility] [address] [place], [place], [address] Phone: [phone]   Fax:  [phone] 02/19/2015, 12:23 PM  Name: Jeffrey [name] MRN: [id number] Date of Birth: 1941-01-05

## 2015-02-19 NOTE — Therapy (Signed)
[facility] [address] [place], [place], [address] Phone: [phone]   Fax:  [phone]  Occupational Therapy Treatment  Patient Details  Name: CELIA GIBBONS MRN: [id number] Date of Birth: 1940/03/22 Referring Provider: Dr. Leonie Man  Encounter Date: 02/19/2015      OT End of Session - 02/19/15 1244    Visit Number 10   Number of Visits 17   Date for OT Re-Evaluation 03/13/15   Authorization Type Blue Medicare HMO, no visit limit/no auth, G-code   Authorization - Visit Number 10   Authorization - Number of Visits 10   OT Start Time 1105   OT Stop Time 1147   OT Time Calculation (min) 42 min   Activity Tolerance Patient tolerated treatment well      Past Medical History  Diagnosis Date  . CAD (coronary artery disease)     PCI distal RCA ...2004, residual 70% LAD   /   ...nuclear...03/2007...no ischemia.Marland KitchenMarland Kitchenpreserved LV /  nuclear...03/03/2009...inferior scar..no ischemia..EF 51%  . Dyslipidemia     takes Atorvastatin daily  . Internal hemorrhoids   . Gout     takes Allopurinol daily and Colchicine as needed  . Seasonal allergic rhinitis   . Right carotid bruit   . Cyst of nasopharynx     per ENT Wilburn Cornelia  . Myocardial infarction [facility]) 28yr ago  . Peripheral edema     takes Lasix daily  . HTN (hypertension)     takes Cardura,Metoprolol,[name],and Amlodipine daily  . Arthritis   . GERD (gastroesophageal reflux disease)     takes Omeprazole daily  . History of colon polyps   . Type 2 diabetes, uncontrolled, with neuropathy ([facility] [place] 1989    takes Invokana daily and has an insulin pump ([name]  . 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 HCAP (healthcare-associated pneumonia)   . Hemiparesis affecting right side as late effect of cerebrovascular accident ([facility] 09/05/2014  . Anterior cerebral circulation hemorrhagic infarction ([facility] 11/24/2014    March,  2016, dominant left thalamic and left internal capsule ischemic infarct with resultant hemorrhagic transformation, secondary to small vessel disease. Resulted in right hemiparesis dysarthria and diplopia   //   readmission with aphasia July, 2016, this improved     Past Surgical History  Procedure Laterality Date  . Cataract extraction Bilateral 2010  . Elbow surgery Right 1997  . Balloon angioplasty, artery  1992, 2004    CAD, Dr. KRon Parker . Colonoscopy  04/03/2002    adenomatous polyp, int hemorrhoids  . Colonoscopy  07/18/2007    normal (Dr. SFuller Plan  . Rotator cuff repair  09/2011    left, with subacromial decompression  . Nasal septum surgery    . Colonoscopy  09/26/2012    tubular adenoma, sm int hem, rpt 5 yrs (Fuller Plan  . Tonsillectomy and adenoidectomy    . Eye lids raised    . Cardiac catheterization  2004  . Polypectomy N/A 05/27/2013    Procedure: ENDOSCOPIC NASOPHARYNGEAL MASS;  Surgeon: DJerrell Belfast MD    There were no vitals filed for this visit.  Visit Diagnosis:  Decreased coordination  Stiffness of right shoulder joint  Hemiparesis affecting right side as late effect of cerebrovascular accident ([facility]  Cognitive deficits following cerebral infarction  Visual disturbance      Subjective Assessment - 02/19/15 1108    Subjective  I am trying to get the holiday spirit   Patient Stated Goals  utilization of right arm "I wish I had more control of it"   Currently in Pain? No/denies                      OT Treatments/Exercises (OP) - 02/19/15 0001    Neurological Re-education Exercises   Other Exercises 1 Neuro re ed to address bilateral overhead reach in supine with closed chain activity. Pt able to control and complete to 145* with vc's to slow down and use vision to guide as well as cues for grading force.  Progresed to unilateral place and hold at 90* of flexion with 1 pound weight - pt needs intermittent min facilitation and assist. Also addressed  place and hold in supine in various planes.  Addressed unilateral reach in sitting in closed chain activity with emphasis on reach with head vs hiking shoulder. Pt able to do with low reach and no physical assist after repetition and vc's.                 OT Education - 02/18/15 1727    Education provided Yes   Education Details Reach pattern to decrease overuse of shoulder elevation   Person(s) Educated Patient   Methods Explanation;Demonstration;Tactile cues;Verbal cues   Comprehension Verbalized understanding;Verbal cues required;Tactile cues required;Need further instruction          OT Short Term Goals - 02/19/15 1242    OT SHORT TERM GOAL #1   Title Pt will be independent with initial HEP.--check STGs 02/10/15   Time 4   Period Weeks   Status Partially Met   OT SHORT TERM GOAL #2   Title Pt will use RUE as gross A/stabilizer at least 25% of the time for ADLs.   Time 4   Period Weeks   Status On-going   OT SHORT TERM GOAL #3   Title Pt will demo at least 55* R shoulder flex with min compensation in prep for functional reach.   Baseline 45*   Time 4   Period Weeks   Status On-going   OT SHORT TERM GOAL #4   Title Pt will be able to complete clothing fasteners (buttons, tying shoes) mod I using AE prn.   Time 4   Period Weeks   Status On-going           OT Long Term Goals - 02/19/15 1242    OT LONG TERM GOAL #1   Title Pt will be independent with updated HEP.--check LTGs 03/13/15   Time 8   Period Weeks   Status On-going   OT LONG TERM GOAL #2   Title Pt will use RUE as gross A/stabilizer for ADLs at least 50% of the time.   Time 8   Period Weeks   Status On-going   OT LONG TERM GOAL #3   Title Pt will demo at least 65* R shoulder flex with min compensation in prep for functional reach.   Baseline 45*   Time 8   Period Weeks   Status On-going   OT LONG TERM GOAL #4   Title Pt will improve RUE functional reaching/coordination for ADLs as shown by  improving score on box and blocks test to at least 15 blocks.   Baseline 9 blocks   Time 8   Period Weeks   Status On-going   OT LONG TERM GOAL #5   Title Pt will perform bathing with supervision.   Time 8   Period Weeks   Status On-going  OT LONG TERM GOAL #6   Title Pt will verbalize understanding of cognitive/visual compensation strategies and activities to improve cognition/vision prn.   Time 8   Period Weeks   Status On-going               Plan - 03-12-2015 1242    Clinical Impression Statement Pt making slow progress toward goals. Pt is better able to grade force and slow movements down with bilateral reach activities as well as low unilateral reach in closed chain activities.    Pt will benefit from skilled therapeutic intervention in order to improve on the following deficits (Retired) Decreased cognition;Decreased mobility;Decreased strength;Impaired vision/preception;Impaired UE functional use;Decreased knowledge of use of DME;Decreased balance;Impaired tone;Impaired sensation;Decreased coordination;Decreased range of motion   Rehab Potential Good   Clinical Impairments Affecting Rehab Potential severity of deficits, sensory deficits   OT Frequency 2x / week   OT Duration 8 weeks   OT Treatment/Interventions Self-care/ADL training;Therapeutic exercise;Functional Mobility Training;Patient/family education;Ultrasound;Neuromuscular education;Manual Therapy;Splinting;Therapeutic exercises;Cryotherapy;Parrafin;DME and/or AE instruction;Therapeutic activities;Cognitive remediation/compensation;Visual/perceptual remediation/compensation;Passive range of motion;Moist Heat;Contrast Bath;Fluidtherapy;Electrical Stimulation   Plan NMR RUE, functional use of RUE, emphasis on control and grading.    OT Home Exercise Plan Education provided:  initial HEP issued   Consulted and Agree with Plan of Care Patient   Family Member Consulted wife, dtr          G-Codes - 2015-03-12 1248     Functional Limitation Carrying, moving and handling objects   Carrying, Moving and Handling Objects Current Status [phone]) At least 46 percent but less than 100 percent impaired, limited or restricted   Carrying, Moving and Handling Objects Goal Status ([id number]) At least 40 percent but less than 60 percent impaired, limited or restricted      Problem List Patient Active Problem List   Diagnosis Date Noted  . Anterior cerebral circulation hemorrhagic infarction ([place]) 11/24/2014  . Hemiparesis affecting right side as late effect of cerebrovascular accident ([place]) 09/05/2014  . Depression due to stroke ([place]) 09/05/2014  . Thrombocytopenia ([place]) 06/05/2014  . Chronic diastolic heart failure, NYHA class 1 ([place]) 06/05/2014  . Ex-smoker 06/03/2014  . Obesity 06/03/2014  . Benign paroxysmal positional vertigo 04/28/2014  . Right carotid bruit   . Polyneuropathy, diabetic ([place]) 08/05/2012  . Seasonal allergic rhinitis   . Trigger thumb of left hand 10/12/2011  . Gout   . Adenomatous polyps   . CAD (coronary artery disease)   . Essential hypertension   . Hyperlipidemia   . Type 2 diabetes, uncontrolled, with neuropathy ([place])   . Ejection fraction    Occupational Therapy Progress Note  Dates of Reporting Period: 01/12/2015 to 03/12/2015  Objective Reports of Subjective Statement: See goal status above.  Pt has only partially met 1 STG due to severity of ataxic movement, sensory impairment, cognitive impairment.    Objective Measurements: Pt is able to complete bilateral overhead reach in supine in closed chain activity to 145* of flexion as well as low unilateral reach in sitting in closed chain activity to approximately 40* of flexion. Pt has great difficulty controlling ataxic movements and compensates by posturing RUE against his trunk and using shoulder elevation to attempt to place arm in space.   Goal Update: See above goal status.   Plan: At next visit, revise goals with patient to  allow more step by step progression for RUE functional use. Also teach and practice assisted reach and use of creating closed chain situations for increased functional use of RUE>  Reason Skilled Services are Required: Pt continues to require skilled OT services due to R hemiplegia, significant ataxic movement, decreased functional use of RUE, impaired functional mobility and ADL's.    Quay Burow, OTR/L 02/19/2015, 12:48 PM  [facility] [address] [place], [place], [address] Phone: [phone]   Fax:  [phone]  Name: JOHNTHOMAS LADER MRN: [id number] Date of Birth: [birth year]

## 2015-02-23 ENCOUNTER — Ambulatory Visit: Payer: Medicare Other | Admitting: Physical Therapy

## 2015-02-23 ENCOUNTER — Ambulatory Visit (INDEPENDENT_AMBULATORY_CARE_PROVIDER_SITE_OTHER): Payer: Medicare Other | Admitting: Family Medicine

## 2015-02-23 ENCOUNTER — Ambulatory Visit: Payer: Medicare Other | Admitting: Occupational Therapy

## 2015-02-23 ENCOUNTER — Encounter: Payer: Self-pay | Admitting: Family Medicine

## 2015-02-23 VITALS — BP 124/72 | HR 68 | Temp 98.1°F | Wt 167.5 lb

## 2015-02-23 DIAGNOSIS — I69351 Hemiplegia and hemiparesis following cerebral infarction affecting right dominant side: Secondary | ICD-10-CM

## 2015-02-23 DIAGNOSIS — R278 Other lack of coordination: Secondary | ICD-10-CM

## 2015-02-23 DIAGNOSIS — I638 Other cerebral infarction: Secondary | ICD-10-CM

## 2015-02-23 DIAGNOSIS — E1165 Type 2 diabetes mellitus with hyperglycemia: Secondary | ICD-10-CM

## 2015-02-23 DIAGNOSIS — E1140 Type 2 diabetes mellitus with diabetic neuropathy, unspecified: Secondary | ICD-10-CM

## 2015-02-23 DIAGNOSIS — M25611 Stiffness of right shoulder, not elsewhere classified: Secondary | ICD-10-CM

## 2015-02-23 DIAGNOSIS — J302 Other seasonal allergic rhinitis: Secondary | ICD-10-CM | POA: Diagnosis not present

## 2015-02-23 DIAGNOSIS — R2681 Unsteadiness on feet: Secondary | ICD-10-CM

## 2015-02-23 DIAGNOSIS — R269 Unspecified abnormalities of gait and mobility: Secondary | ICD-10-CM

## 2015-02-23 DIAGNOSIS — I639 Cerebral infarction, unspecified: Secondary | ICD-10-CM

## 2015-02-23 DIAGNOSIS — IMO0002 Reserved for concepts with insufficient information to code with codable children: Secondary | ICD-10-CM

## 2015-02-23 DIAGNOSIS — M6281 Muscle weakness (generalized): Secondary | ICD-10-CM | POA: Diagnosis not present

## 2015-02-23 DIAGNOSIS — I10 Essential (primary) hypertension: Secondary | ICD-10-CM | POA: Diagnosis not present

## 2015-02-23 DIAGNOSIS — I6389 Other cerebral infarction: Secondary | ICD-10-CM

## 2015-02-23 DIAGNOSIS — F0631 Mood disorder due to known physiological condition with depressive features: Secondary | ICD-10-CM

## 2015-02-23 DIAGNOSIS — R279 Unspecified lack of coordination: Secondary | ICD-10-CM

## 2015-02-23 NOTE — Assessment & Plan Note (Signed)
Stable. Continues outpt rehab. Discussed baclofen use.

## 2015-02-23 NOTE — Assessment & Plan Note (Signed)
Change claritin to PRN.

## 2015-02-23 NOTE — Therapy (Signed)
[facility] [address] [place], [place], [address] Phone: [phone]   Fax:  [phone]  Occupational Therapy Treatment  Patient Details  Name: Jeffrey [name] MRN: [id number] Date of Birth: 06/02/1940 Referring Provider: Dr. Leonie Man  Encounter Date: 02/23/2015      OT End of Session - 02/23/15 1104    Visit Number 11   Number of Visits 17   Date for OT Re-Evaluation 03/13/15   Authorization Type [employer] HMO, no visit limit/no auth, G-code   Authorization - Visit Number 11   Authorization - Number of Visits 10   OT Start Time 1102   OT Stop Time 1145   OT Time Calculation (min) 43 min   Activity Tolerance Patient tolerated treatment well   Behavior During Therapy [facility] for tasks assessed/performed      Past Medical History  Diagnosis Date  . CAD (coronary artery disease)     PCI distal RCA ...2004, residual 70% LAD   /   ...nuclear...03/2007...no ischemia.Marland KitchenMarland Kitchenpreserved LV /  nuclear...03/03/2009...inferior scar..no ischemia..EF 51%  . Dyslipidemia     takes Atorvastatin daily  . Internal hemorrhoids   . Gout     takes Allopurinol daily and Colchicine as needed  . Seasonal allergic rhinitis   . Right carotid bruit   . Cyst of nasopharynx     per ENT Wilburn Cornelia  . Myocardial infarction [facility]) 36yr ago  . Peripheral edema     takes Lasix daily  . HTN (hypertension)     takes Cardura,Metoprolol,[name],and Amlodipine daily  . Arthritis   . GERD (gastroesophageal reflux disease)     takes Omeprazole daily  . History of colon polyps   . Type 2 diabetes, uncontrolled, with neuropathy ([place] 1989    takes Invokana daily and has an insulin pump ([name]  . 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 HCAP (healthcare-associated pneumonia)   . Hemiparesis affecting right side as late effect of cerebrovascular accident ([place] 09/05/2014  . Anterior cerebral  circulation hemorrhagic infarction ([facility] 11/24/2014    March, 2016, dominant left thalamic and left internal capsule ischemic infarct with resultant hemorrhagic transformation, secondary to small vessel disease. Resulted in right hemiparesis dysarthria and diplopia   //   readmission with aphasia July, 2016, this improved     Past Surgical History  Procedure Laterality Date  . Cataract extraction Bilateral 2010  . Elbow surgery Right 1997  . Balloon angioplasty, artery  1992, 2004    CAD, Dr. KRon Parker . Colonoscopy  04/03/2002    adenomatous polyp, int hemorrhoids  . Colonoscopy  07/18/2007    normal (Dr. SFuller Plan  . Rotator cuff repair  09/2011    left, with subacromial decompression  . Nasal septum surgery    . Colonoscopy  09/26/2012    tubular adenoma, sm int hem, rpt 5 yrs ([place] Plan  . Tonsillectomy and adenoidectomy    . Eye lids raised    . Cardiac catheterization  2004  . Polypectomy N/A 05/27/2013    Procedure: ENDOSCOPIC NASOPHARYNGEAL MASS;  Surgeon: DJerrell Belfast MD    There were no vitals filed for this visit.  Visit Diagnosis:  Hemiparesis affecting right side as late effect of cerebrovascular accident ([place]  Stiffness of right shoulder joint  Decreased coordination      Subjective Assessment - 02/23/15 1102    Subjective  Pt reports that he feels like the arm is improving   Patient  Stated Goals utilization of right arm "I wish I had more control of it"   Currently in Pain? No/denies                      OT Treatments/Exercises (OP) - 02/23/15 0001    ADLs   LB Dressing Pt able to fasten shoes with use of shoe buttons and returned demo.  Pt reports that he is close to being able to tie shoes, but has not practiced.   Writing Encouraged pt to use R hand to type some for work on [employer]  Pt verbalized understanding.   ADL Comments Checked progress on goals (see goals section).   Neurological Re-education Exercises   Other Information  Maintaining scapular depression/retraction to push down yellow theraband for prolonged hold and then controlled release for incr awareness of shoulder hike   Forearm Supination AROM;Seated  isolated with min cues for shoulder hike   Other Exercises 1 focus on controlled movements to place hand on mat and table with mod cues for shoulder hike (decr with cueing, but difficult for pt to avoid due to decr control.  Also controlled slide on table to lightly touch objects with mod cues for compensation and min-mod difficulty.  Used mirror to improved pt awareness   Other Grasp and Release Exercises  In sitting, low range functional reaching to grasp/release cylinder objects with min facilitation for control. Pt with mod cues for compensation patterns/shoulder hike.   Other Grasp and Release Exercises  Flipping large cards with mod cues/difficulty for controlled movement and to avoid compensation patterns.                OT Education - 02/23/15 1242    Education Details practice tying shoe, use of shoe buttons, try typing with RUE for work on control   Person(s) Educated Patient   Methods Explanation   Comprehension Returned demonstration;Verbalized understanding          OT Short Term Goals - 02/23/15 1104    OT SHORT TERM GOAL #1   Title Pt will be independent with initial HEP.--check STGs 02/10/15   Time 4   Period Weeks   Status Partially Met   OT SHORT TERM GOAL #2   Title Pt will use RUE as gross A/stabilizer at least 25% of the time for ADLs.   Time 4   Period Weeks   Status Achieved  02/23/15     OT SHORT TERM GOAL #3   Title Pt will demo at least 55* R shoulder flex with min compensation in prep for functional reach.   Baseline 45*   Time 4   Period Weeks   Status On-going   OT SHORT TERM GOAL #4   Title Pt will be able to complete clothing fasteners (buttons, tying shoes) mod I using AE prn.   Time 4   Period Weeks   Status Achieved  02/23/15           OT  Long Term Goals - 02/23/15 1114    OT LONG TERM GOAL #1   Title Pt will be independent with updated HEP.--check LTGs 03/13/15   Time 8   Period Weeks   Status On-going   OT LONG TERM GOAL #2   Title Pt will use RUE as gross A/stabilizer for ADLs at least 50% of the time.   Time 8   Period Weeks   Status On-going   OT LONG TERM GOAL #3   Title Pt  will demo at least 65* R shoulder flex with min compensation in prep for functional reach.   Baseline 45*   Time 8   Period Weeks   Status On-going   OT LONG TERM GOAL #4   Title Pt will improve RUE functional reaching/coordination for ADLs as shown by improving score on box and blocks test to at least 15 blocks.   Baseline 9 blocks   Time 8   Period Weeks   Status On-going   OT LONG TERM GOAL #5   Title Pt will perform bathing with supervision.   Time 8   Period Weeks   Status Achieved   OT LONG TERM GOAL #6   Title Pt will verbalize understanding of cognitive/visual compensation strategies and activities to improve cognition/vision prn.   Time 8   Period Weeks   Status On-going               Plan - 02/23/15 1155    Clinical Impression Statement Pt is making slow progress towards goals.  Pt reports incr independence with bathing and dressing tasks and reports incr use of RUE.   Plan needs to schedule more appts, Neuro re-ed, RUE functional use, emphasis on control/grading and decr shoulder hike   OT Home Exercise Plan Education provided:  initial HEP issued   Consulted and Agree with Plan of Care Patient   Family Member Consulted wife, dtr        Problem List Patient Active Problem List   Diagnosis Date Noted  . Anterior cerebral circulation hemorrhagic infarction ([place]) 11/24/2014  . Hemiparesis affecting right side as late effect of cerebrovascular accident ([place]) 09/05/2014  . Depression due to stroke ([place]) 09/05/2014  . Thrombocytopenia ([place]) 06/05/2014  . Chronic diastolic heart failure, NYHA class 1 ([place])  06/05/2014  . Ex-smoker 06/03/2014  . Obesity 06/03/2014  . Benign paroxysmal positional vertigo 04/28/2014  . Right carotid bruit   . Polyneuropathy, diabetic ([place]) 08/05/2012  . Seasonal allergic rhinitis   . Trigger thumb of left hand 10/12/2011  . Gout   . Adenomatous polyps   . CAD (coronary artery disease)   . Essential hypertension   . Hyperlipidemia   . Type 2 diabetes, uncontrolled, with neuropathy ([place])   . Ejection fraction     [facility] 02/23/2015, 1:08 PM  [facility] [address] [place], [place], [address] Phone: [phone]   Fax:  [phone]  Name: Jeffrey [name] MRN: [id number] Date of Birth: 1940/10/22  Vianne Bulls, OTR/L 02/23/2015 1:08 PM

## 2015-02-23 NOTE — Patient Instructions (Addendum)
Decrease lactobacillus to once daily. If doing well, may stop this.  Ok to change claritin to just as needed.  Same with sennakot. Ok to stop flomax - watch for worsening urinary symptoms.  You are doing well today. Return in 4 months for medicare wellness visit

## 2015-02-23 NOTE — Assessment & Plan Note (Signed)
Chronic, stable. Continue current regimen. 

## 2015-02-23 NOTE — Progress Notes (Signed)
Pre visit review using our clinic review tool, if applicable. No additional management support is needed unless otherwise documented below in the visit note. 

## 2015-02-23 NOTE — Assessment & Plan Note (Signed)
Pt/family feel he's doing well on sertraline 25mg . Continue.

## 2015-02-23 NOTE — Assessment & Plan Note (Signed)
Per pt good control. Followed by endo, on insulin pump.

## 2015-02-23 NOTE — Assessment & Plan Note (Signed)
Appreciate neuro/cards care of patient. Stable period.

## 2015-02-23 NOTE — Patient Instructions (Signed)
 -   Practice tying shoe - Try to use R hand to type some

## 2015-02-23 NOTE — Progress Notes (Signed)
BP 124/72 mmHg  Pulse 68  Temp(Src) 98.1 F (36.7 C) (Oral)  Wt 167 lb 8 oz (75.978 kg)   CC: f/u visit  Subjective:    Patient ID: Jeffrey [name], male    DOB: [birth year], 74 y.o.   MRN: [id number]  HPI: Jeffrey [name] is a 74 y.o. male presenting on 02/23/2015 for Follow-up and Medication Management   S/p L thalamic/IC stroke with hemorrhagic transformation 06/2014. Residual R sided hemiparesis. On plavix post stroke. Had 2 OT rehab sessions today.  Recent stable eval by Dr Marlou Porch.   HLD - well controlled on high dose lipitor.  Requests med changes: lactobacillus  claritin sennakot flomax - started after stroke - unable to feel urge. No h/o BPH. Now doing better. Nocturia x1-2. Strong stream, now feels urge to go.   Relevant past medical, surgical, family and social history reviewed and updated as indicated. Interim medical history since our last visit reviewed. Allergies and medications reviewed and updated. Current Outpatient Prescriptions on File Prior to Visit  Medication Sig  . atorvastatin (LIPITOR) 80 MG tablet Take 80 mg by mouth at bedtime.  . baclofen (LIORESAL) 10 MG tablet Take 5 mg by mouth 3 (three) times daily as needed for muscle spasms (TAKES AT BREAKFAST, CAN TAKE TWO MORE TIMES AS NEEDED). Daily in am, may have additional dose if needed for muscle spasm  . Cholecalciferol (VITAMIN D3) 5000 UNITS TABS Take 5,000 Units by mouth once a week.  . clopidogrel (PLAVIX) 75 MG tablet Take 1 tablet (75 mg total) by mouth daily.  . fosinopril ([name]) 40 MG tablet Take 40 mg by mouth daily.  . furosemide (LASIX) 40 MG tablet Take 40 mg by mouth daily.  . insulin glargine (LANTUS) 100 UNIT/ML injection Inject 18 Units into the skin at bedtime.   . insulin lispro (HUMALOG) 100 UNIT/ML injection Inject 6 Units into the skin 3 (three) times daily before meals.   [name] LACTOBACILLUS PO Take 1 capsule by mouth daily. [facility] colon [facility]  . loratadine (CLARITIN) 10 MG  tablet Take 10 mg by mouth daily as needed.   . metoprolol tartrate (LOPRESSOR) 25 MG tablet Take 25 mg by mouth 2 (two) times daily. Hold if B/P < 100/60 or HR< 60  . sertraline (ZOLOFT) 25 MG tablet Take 1 tablet (25 mg total) by mouth daily.  [name] senna (SENOKOT) 8.6 MG tablet Take 1 tablet by mouth daily as needed. Reported on 02/23/2015   No current facility-administered medications on file prior to visit.    Review of Systems Per HPI unless specifically indicated in ROS section     Objective:    BP 124/72 mmHg  Pulse 68  Temp(Src) 98.1 F (36.7 C) (Oral)  Wt 167 lb 8 oz (75.978 kg)  Wt Readings from Last 3 Encounters:  02/23/15 167 lb 8 oz (75.978 kg)  12/29/14 162 lb 12.8 oz (73.846 kg)  12/18/14 161 lb (73.029 kg)   Body mass index is 25.47 kg/(m^2).  Physical Exam  Constitutional: He is oriented to person, place, and time. He appears well-developed and well-nourished. No distress.  In transport chair  HENT:  Mouth/Throat: Oropharynx is clear and moist. No oropharyngeal exudate.  Cardiovascular: Normal rate, regular rhythm, normal heart sounds and intact distal pulses.   No murmur heard. Pulmonary/Chest: Effort normal and breath sounds normal. No respiratory distress. He has no wheezes. He has no rales.  Musculoskeletal: He exhibits no edema.  Neurological: He is alert and oriented to  person, place, and time.  4/5 strength RUE, 5/5 strength otherwise Brace RLE in place  Skin: Skin is warm and dry. No rash noted.  Psychiatric: He has a normal mood and affect.  Nursing note and vitals reviewed.     Assessment & Plan:  Med rec performed, minimized med regimen as per instructions.  Problem List Items Addressed This Visit    Type 2 diabetes, uncontrolled, with neuropathy ([place])    Per pt good control. Followed by endo, on insulin pump.      Seasonal allergic rhinitis    Change claritin to PRN.      Hemiparesis affecting right side as late effect of cerebrovascular  accident ([place])    Stable. Continues outpt rehab. Discussed baclofen use.      Essential hypertension - Primary    Chronic, stable. Continue current regimen      Depression due to stroke [facility])    Pt/family feel he's doing well on sertraline 25mg . Continue.      Anterior cerebral circulation hemorrhagic infarction [facility])    Appreciate neuro/cards care of patient. Stable period.          Follow up plan: Return in about 4 months (around 06/24/2015), or as needed, for medicare wellness visit.

## 2015-02-23 NOTE — Therapy (Signed)
[facility] [address] [place], [place], [address] Phone: [phone]   Fax:  [phone]  Physical Therapy Treatment  Patient Details  Name: Jeffrey [name] MRN: [id number] Date of Birth: 07-06-1940 Referring Provider: Leonie Man  Encounter Date: 02/23/2015      PT End of Session - 02/23/15 2015    Visit Number 12   Number of Visits 17   Date for PT Re-Evaluation 03/08/15   Authorization Type Blue Medicare HMO-G- code every 10th visit   PT Start Time 1015   PT Stop Time 1100   PT Time Calculation (min) 45 min   Equipment Utilized During Treatment Gait belt   Activity Tolerance Patient tolerated treatment well   Behavior During Therapy [facility] for tasks assessed/performed      Past Medical History  Diagnosis Date  . CAD (coronary artery disease)     PCI distal RCA ...2004, residual 70% LAD   /   ...nuclear...03/2007...no ischemia.Marland KitchenMarland Kitchenpreserved LV /  nuclear...03/03/2009...inferior scar..no ischemia..EF 51%  . Dyslipidemia     takes Atorvastatin daily  . Internal hemorrhoids   . Gout     takes Allopurinol daily and Colchicine as needed  . Seasonal allergic rhinitis   . Right carotid bruit   . Cyst of nasopharynx     per ENT Wilburn Cornelia  . Myocardial infarction [facility]) 79yr ago  . Peripheral edema     takes Lasix daily  . HTN (hypertension)     takes Cardura,Metoprolol,[name],and Amlodipine daily  . Arthritis   . GERD (gastroesophageal reflux disease)     takes Omeprazole daily  . History of colon polyps   . Type 2 diabetes, uncontrolled, with neuropathy ([place] 1989    takes Invokana daily and has an insulin pump ([name]  . 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 HCAP (healthcare-associated pneumonia)   . Hemiparesis affecting right side as late effect of [place]vascular [place] ([place] 09/05[place]2014  . Anterior cerebral circulation hemorrhagic infarction ([facility]  11/24/2014    March, 2016, dominant left thalamic and left internal capsule ischemic infarct with resultant hemorrhagic transformation, secondary to small vessel disease. Resulted in right hemiparesis dysarthria and diplopia   //   readmission with aphasia July, 2016, this improved     Past Surgical History  Procedure Laterality Date  . Cataract extraction Bilateral 2010  . Elbow surgery Right 1997  . Balloon angioplasty, artery  1992, 2004    CAD, Dr. KRon Parker . Colonoscopy  04/03/2002    adenomatous polyp, int hemorrhoids  . Colonoscopy  07/18/2007    normal (Dr. SFuller Plan  . Rotator cuff repair  09/2011    left, with subacromial decompression  . Nasal septum surgery    . Colonoscopy  09/26/2012    tubular adenoma, sm int hem, rpt 5 yrs (Fuller Plan  . Tonsillectomy and adenoidectomy    . Eye lids raised    . Cardiac catheterization  2004  . Polypectomy N/A 05/27/2013    Procedure: ENDOSCOPIC NASOPHARYNGEAL MASS;  Surgeon: DJerrell Belfast MD    There were no vitals filed for this visit.  Visit Diagnosis:  Abnormality of gait  Unsteadiness  Decreased coordination  Hemiparesis affecting right side as late effect of cerebrovascular accident ([facility]      Subjective Assessment - 02/23/15 1023    Subjective Pt arrived to session ambulating with RW.  Pt has been ambulating more at home with SBA of daughter. Daughter and pt expressing  concern about pt's R hand occasionally coming off RW unexpectedly, causing pt to lose balance. Per daughter, this occurs more frequently when pt distracted or in crowded area.  Pt's daughter reports pt's blood glucose dropped after 2 consecutive therapies. Pt's family will begin packing bag of snacks for pt to eat if pt feels blood sugar go down.   Patient is accompained by: Family member  daughter and wife   Pertinent History * Blood glucose may drop when pt has 2 therapies in a row. Pt's family packs bag of snacks to eat if pt feels hypoglycemic.   Patient Stated  Goals Pt's goal for therapy is to avoid losing the strength he has regained since March.  He would like to be mobile without walker.   Currently in Pain? No/denies                         [facility] Adult PT Treatment/Exercise - 02/23/15 2007    Transfers   Transfers Sit to Stand;Stand to Sit   Sit to Stand 4: Min guard   Sit to Stand Details (indicate cue type and reason) Initially required verbal/demo cueing for placement of R hand in hand orthosis; required min guard for stability/balance while managing RUE.   Stand to Sit 4: Min guard   Stand to Sit Details See above,   Ambulation/Gait   Ambulation/Gait Yes   Ambulation/Gait Assistance 4: Min guard   Ambulation/Gait Assistance Details During initial 35' of gait, pt exhibited significant LOB due to R hand slipping off RW grip. Therefore, fit pt for R hand orthosis to maintain R hand position on RW and to ensure safety with ambulation. Pt then performed all remaining gait trials with RW and R hand orthosis.   Ambulation Distance (Feet) 425 Feet  50 x 1   Assistive device Rolling walker;Other (Comment)  R AFO and R hand orthosis   Gait Pattern Step-through pattern;Decreased stance time - right;Decreased step length - left;Decreased weight shift to right;Right genu recurvatum;Ataxic;Abducted- right  R recurvatum only significant when ascending ramp   Ambulation Surface Level;Indoor   Ramp 5: Supervision;4: Min assist   Ramp Details (indicate cue type and reason) using RW and R hand orthosis; verbal/tactile cueing at posterior R knee during ascent to prevent R genu recurvatum. During descent, provided verbal/demo cueing for hand placement (focus on L hand) to decrease speed, increase control of descent.   Curb 3: Mod assist;4: Min assist   Curb Details (indicate cue type and reason) Blocked practice of curb step negotiation with RW and R hand orthosis. Required Mod A for initial ascent due to hand not fully descured in hand  orthosis and pt sustaining LOB to R side. For remaining trials, pt required min guard to min A. Pt exhibited consistent within-session carryover of cueing for sequencing/technique.   Gait Comments --   Timed Up and Go Test   TUG --   Normal TUG (seconds) --                PT Education - 02/23/15 2006    Education provided Yes   Education Details technique for curb step negotiation with RW   Person(s) Educated Patient   Methods Explanation;Demonstration;Verbal cues   Comprehension Verbalized understanding;Returned demonstration          PT Short Term Goals - 02/03/15 1324    PT SHORT TERM GOAL #1   Title Pt will perform HEP with family assistance/supervision, for lower extremitiy strengthening.  TARGET 02/06/15   Time 4   Period Weeks   Status Achieved   PT SHORT TERM GOAL #2   Title Pt will perform sit<>stand transfers with supervision, for improved efficiency and safety with transfers.   Baseline Met 11/22.   Time 4   Period Weeks   Status Achieved   PT SHORT TERM GOAL #3   Title Pt will improve TUG score to less than or equal to 70 seconds for decreased fall risk.   Baseline 52.62 seconds with R AFO and heel wedge;73 seconds without AFO   Time 4   Period Weeks   Status Achieved   PT SHORT TERM GOAL #4   Title Pt will ambulate at least 150 ft using least restrictive assistive device with minimal assistance for improved gait efficiency.   Baseline Met 11/22.   Time 4   Period Weeks   Status Achieved   PT SHORT TERM GOAL #5   Title Pt will verbalize understanding of CVA education.   Time 4   Period Weeks   Status Achieved           PT Long Term Goals - 01/08/15 1311    PT LONG TERM GOAL #1   Title Pt will verbalize understanding of fall prevention within home environment.  TARGET 03/08/15   Time 8   Period Weeks   Status New   PT LONG TERM GOAL #2   Title Pt will improve Timed Up and GO score to less than or equal to 50 seconds for decreased fall risk.    Time 8   Period Weeks   Status New   PT LONG TERM GOAL #3   Title Pt will improve gait velocity to at least 0.8 ft/sec for improved gait efficiency and safety.   Time 8   Period Weeks   Status New   PT LONG TERM GOAL #4   Title Berg Balance score to be assessed, with pt improving Berg score by at least 8 points.   Time 8   Period Weeks   Status New   PT LONG TERM GOAL #5   Title Pt will ambulate at least 250 ft. using least restrictive assistive device with supervision for improved efficiency and safety with gait.   Time 8   Period Weeks   Additional Long Term Goals   Additional Long Term Goals Yes   PT LONG TERM GOAL #6   Title Pt will verbalize plans for continued community fitness upon D/C from PT.   Time 8   Period Weeks   Status New               Plan - 02/23/15 2015    Clinical Impression Statement Session focused on gait training with RW and R hand orthosis with emphasis on community obstacle negotiation. For the first time, pt arrived to session ambulating with rolling walker. Due to increased stability/independence with gait using hand orthosis, provided pt with personal hand orthosis during this session. Pt/family verbalized understanding and were in full agreement.    Pt will benefit from skilled therapeutic intervention in order to improve on the following deficits Abnormal gait;Decreased balance;Decreased mobility;Decreased coordination;Decreased strength;Difficulty walking;Impaired sensation;Impaired tone   Rehab Potential Good   PT Frequency 2x / week   PT Duration 8 weeks  plus eval   PT Treatment/Interventions ADLs/Self Care Home Management;Therapeutic exercise;Therapeutic activities;Functional mobility training;Gait training;Balance training;Neuromuscular re-education;Electrical Stimulation   PT Next Visit Plan Continue to address RLE WB, NMR and coordination, gait w/  quad tip cane   Consulted and Agree with Plan of Care Patient;Family member/caregiver    Family Member Consulted Daughter, and wife        Problem List Patient Active Problem List   Diagnosis Date Noted  . Anterior cerebral circulation hemorrhagic infarction ([place]) 11/24/2014  . Hemiparesis affecting right side as late effect of cerebrovascular accident ([place]) 09/05/2014  . Depression due to stroke ([place]) 09/05/2014  . Thrombocytopenia ([place]) 06/05/2014  . Chronic diastolic heart failure, NYHA class 1 ([place]) 06/05/2014  . Ex-smoker 06/03/2014  . Benign paroxysmal positional vertigo 04/28/2014  . Right carotid bruit   . Polyneuropathy, diabetic ([place]) 08/05/2012  . Seasonal allergic rhinitis   . Trigger thumb of left hand 10/12/2011  . Gout   . Adenomatous polyps   . CAD (coronary artery disease)   . Essential hypertension   . Hyperlipidemia   . Type 2 diabetes, uncontrolled, with neuropathy ([place])   . Ejection fraction    Billie Ruddy, PT, DPT [facility] [address] [place], [place], [address] Phone: [phone]   Fax:  [phone] 02/23/2015, 8:21 PM   Name: Jeffrey [name] MRN: [id number] Date of Birth: Feb 09, 1941

## 2015-02-25 ENCOUNTER — Encounter: Payer: Self-pay | Admitting: Rehabilitation

## 2015-02-25 ENCOUNTER — Ambulatory Visit: Payer: Medicare Other | Admitting: Rehabilitation

## 2015-02-25 ENCOUNTER — Ambulatory Visit: Payer: Medicare Other | Admitting: Occupational Therapy

## 2015-02-25 DIAGNOSIS — R269 Unspecified abnormalities of gait and mobility: Secondary | ICD-10-CM

## 2015-02-25 DIAGNOSIS — R2681 Unsteadiness on feet: Secondary | ICD-10-CM

## 2015-02-25 DIAGNOSIS — R279 Unspecified lack of coordination: Secondary | ICD-10-CM

## 2015-02-25 DIAGNOSIS — R278 Other lack of coordination: Secondary | ICD-10-CM

## 2015-02-25 DIAGNOSIS — G8191 Hemiplegia, unspecified affecting right dominant side: Secondary | ICD-10-CM

## 2015-02-25 DIAGNOSIS — I69319 Unspecified symptoms and signs involving cognitive functions following cerebral infarction: Secondary | ICD-10-CM

## 2015-02-25 DIAGNOSIS — M6281 Muscle weakness (generalized): Secondary | ICD-10-CM

## 2015-02-25 DIAGNOSIS — M25611 Stiffness of right shoulder, not elsewhere classified: Secondary | ICD-10-CM

## 2015-02-25 NOTE — Therapy (Signed)
[facility] [address] [place], [place], [address] Phone: [phone]   Fax:  [phone]  Physical Therapy Treatment  Patient Details  Name: Jeffrey [name] MRN: [id number] Date of Birth: [birth year] Referring Provider: Leonie Man  Encounter Date: 02/25/2015      PT End of Session - 02/25/15 1455    Visit Number 13   Number of Visits 17   Date for PT Re-Evaluation 03/08/15   Authorization Type Blue Medicare HMO-G- code every 10th visit   PT Start Time 1450   PT Stop Time 1535   PT Time Calculation (min) 45 min   Equipment Utilized During Treatment Gait belt   Activity Tolerance Patient tolerated treatment well   Behavior During Therapy [facility] for tasks assessed/performed      Past Medical History  Diagnosis Date  . CAD (coronary artery disease)     PCI distal RCA ...2004, residual 70% LAD   /   ...nuclear...03/2007...no ischemia.Marland KitchenMarland Kitchenpreserved LV /  nuclear...03/03/2009...inferior scar..no ischemia..EF 51%  . Dyslipidemia     takes Atorvastatin daily  . Internal hemorrhoids   . Gout     takes Allopurinol daily and Colchicine as needed  . Seasonal allergic rhinitis   . Right carotid bruit   . Cyst of nasopharynx     per ENT Wilburn Cornelia  . Myocardial infarction [facility]) 1yr ago  . Peripheral edema     takes Lasix daily  . HTN (hypertension)     takes Cardura,Metoprolol,[name],and Amlodipine daily  . Arthritis   . GERD (gastroesophageal reflux disease)     takes Omeprazole daily  . History of colon polyps   . Type 2 diabetes, uncontrolled, with neuropathy ([place] 1989    takes Invokana daily and has an insulin pump ([name]  . 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 HCAP (healthcare-associated pneumonia)   . Hemiparesis affecting right side as late effect of [place]vascular accident ([place] 09/05[place]  . Anterior cerebral circulation hemorrhagic infarction ([facility]  11/24/2014    March, 2016, dominant left thalamic and left internal capsule ischemic infarct with resultant hemorrhagic transformation, secondary to small vessel disease. Resulted in right hemiparesis dysarthria and diplopia   //   readmission with aphasia July, 2016, this improved     Past Surgical History  Procedure Laterality Date  . Cataract extraction Bilateral 2010  . Elbow surgery Right 1997  . Balloon angioplasty, artery  1992, 2004    CAD, Dr. KRon Parker . Colonoscopy  04/03/2002    adenomatous polyp, int hemorrhoids  . Colonoscopy  07/18/2007    normal (Dr. SFuller Plan  . Rotator cuff repair  09/2011    left, with subacromial decompression  . Nasal septum surgery    . Colonoscopy  09/26/2012    tubular adenoma, sm int hem, rpt 5 yrs (Fuller Plan  . Tonsillectomy and adenoidectomy    . Eye lids raised    . Cardiac catheterization  2004  . Polypectomy N/A 05/27/2013    Procedure: ENDOSCOPIC NASOPHARYNGEAL MASS;  Surgeon: DJerrell Belfast MD    There were no vitals filed for this visit.  Visit Diagnosis:  Abnormality of gait  Unsteadiness  Decreased coordination  Muscle weakness of lower extremity      Subjective Assessment - 02/25/15 1454    Subjective Reports didnt walk in today as someone else was picking him up and family not wanting him to walk with anyone else.    Patient is accompained by:  Family member   Pertinent History * Blood glucose may drop when pt has 2 therapies in a row. Pt's family packs bag of snacks to eat if pt feels hypoglycemic.   Patient Stated Goals Pt's goal for therapy is to avoid losing the strength he has regained since March.  He would like to be mobile without walker.   Currently in Pain? No/denies             Gait:  Assessed gait with RW and R hand orthosis while negotiating curb/ramp to simulate community re-entry.  Pt performed x 2 reps at min/guard to close S level with only single cue needed for ascending curb step with stronger extremity.   Allowed rest break before continuing with gait on outdoor surfaces (unlevel paved and grassy surface) x 700' at S level (for paved surfaces) and min/guard level (for grassy surfaces).   Min cues for stepping sequence and safety in grass but pt did a great job ensuring RW and foot placement for stability before stepping.  Pt with slow gait speed, however able to complete without c/o low blood sugar.  Ended session with single lap (115') with quad tip cane at min/guard to min A level (mainly to assist RUE to be in relaxed position) during gait.  Min cues for stepping sequence, but overall pt is improving.                      PT Education - 02/25/15 1455    Education provided Yes   Education Details education on continuing to ambulate as much as possible and putting w/c "away" at home.    Person(s) Educated Patient;Child(ren);Spouse   Methods Explanation   Comprehension Verbalized understanding          PT Short Term Goals - 02/03/15 1324    PT SHORT TERM GOAL #1   Title Pt will perform HEP with family assistance/supervision, for lower extremitiy strengthening.  TARGET 02/06/15   Time 4   Period Weeks   Status Achieved   PT SHORT TERM GOAL #2   Title Pt will perform sit<>stand transfers with supervision, for improved efficiency and safety with transfers.   Baseline Met 11/22.   Time 4   Period Weeks   Status Achieved   PT SHORT TERM GOAL #3   Title Pt will improve TUG score to less than or equal to 70 seconds for decreased fall risk.   Baseline 52.62 seconds with R AFO and heel wedge;73 seconds without AFO   Time 4   Period Weeks   Status Achieved   PT SHORT TERM GOAL #4   Title Pt will ambulate at least 150 ft using least restrictive assistive device with minimal assistance for improved gait efficiency.   Baseline Met 11/22.   Time 4   Period Weeks   Status Achieved   PT SHORT TERM GOAL #5   Title Pt will verbalize understanding of CVA education.   Time 4    Period Weeks   Status Achieved           PT Long Term Goals - 01/08/15 1311    PT LONG TERM GOAL #1   Title Pt will verbalize understanding of fall prevention within home environment.  TARGET 03/08/15   Time 8   Period Weeks   Status New   PT LONG TERM GOAL #2   Title Pt will improve Timed Up and GO score to less than or equal to 50 seconds for decreased  fall risk.   Time 8   Period Weeks   Status New   PT LONG TERM GOAL #3   Title Pt will improve gait velocity to at least 0.8 ft/sec for improved gait efficiency and safety.   Time 8   Period Weeks   Status New   PT LONG TERM GOAL #4   Title Berg Balance score to be assessed, with pt improving Berg score by at least 8 points.   Time 8   Period Weeks   Status New   PT LONG TERM GOAL #5   Title Pt will ambulate at least 250 ft. using least restrictive assistive device with supervision for improved efficiency and safety with gait.   Time 8   Period Weeks   Additional Long Term Goals   Additional Long Term Goals Yes   PT LONG TERM GOAL #6   Title Pt will verbalize plans for continued community fitness upon D/C from PT.   Time 8   Period Weeks   Status New               Plan - 02/25/15 1455    Clinical Impression Statement Skilled session focused on gait training with use of RW and R hand orthosis with commmunity ambulation and negotiation of curb/ramp (assessment from last session) to increase activity tolerance, endurance and quality of gait.     Pt will benefit from skilled therapeutic intervention in order to improve on the following deficits Abnormal gait;Decreased balance;Decreased mobility;Decreased coordination;Decreased strength;Difficulty walking;Impaired sensation;Impaired tone   Rehab Potential Good   PT Frequency 2x / week   PT Duration 8 weeks  plus eval   PT Treatment/Interventions ADLs/Self Care Home Management;Therapeutic exercise;Therapeutic activities;Functional mobility training;Gait  training;Balance training;Neuromuscular re-education;Electrical Stimulation   PT Next Visit Plan Continue to address RLE WB, NMR and coordination, gait w/ quad tip cane   Consulted and Agree with Plan of Care Patient;Family member/caregiver   Family Member Consulted Daughter, and wife        Problem List Patient Active Problem List   Diagnosis Date Noted  . Anterior cerebral circulation hemorrhagic infarction ([place]) 11/24/2014  . Hemiparesis affecting right side as late effect of cerebrovascular accident ([place]) 09/05/2014  . Depression due to stroke ([place]) 09/05/2014  . Thrombocytopenia ([place]) 06/05/2014  . Chronic diastolic heart failure, NYHA class 1 ([place]) 06/05/2014  . Ex-smoker 06/03/2014  . Benign paroxysmal positional vertigo 04/28/2014  . Right carotid bruit   . Polyneuropathy, diabetic ([place]) 08/05/2012  . Seasonal allergic rhinitis   . Trigger thumb of left hand 10/12/2011  . Gout   . Adenomatous polyps   . CAD (coronary artery disease)   . Essential hypertension   . Hyperlipidemia   . Type 2 diabetes, uncontrolled, with neuropathy ([place])   . Ejection fraction     Cameron Sprang, PT, MPT [facility] [address] [place], [place], [address] Phone: [phone]   Fax:  [phone] 02/25/2015, 5:27 PM  Name: SAQIB CAZAREZ MRN: [id number] Date of Birth: 11-26-1940

## 2015-02-26 NOTE — Therapy (Signed)
[facility] [address] [place], [place], [address] Phone: [phone]   Fax:  [phone]  Occupational Therapy Treatment  Patient Details  Name: Jeffrey [name] MRN: [id number] Date of Birth: [birth year] Referring Provider: Dr. Leonie Man  Encounter Date: 02/25/2015      OT End of Session - 02/26/15 0832    Visit Number 12   Number of Visits 17   Date for OT Re-Evaluation 03/13/15   Authorization Type Blue Medicare HMO, no visit limit/no auth, G-code   Authorization - Visit Number 12   Authorization - Number of Visits 20   OT Start Time 6415   OT Stop Time 1445   OT Time Calculation (min) 40 min   Behavior During Therapy [facility] for tasks assessed/performed      Past Medical History  Diagnosis Date  . CAD (coronary artery disease)     PCI distal RCA ...2004, residual 70% LAD   /   ...nuclear...03/2007...no ischemia.Marland KitchenMarland Kitchenpreserved LV /  nuclear...03/03/2009...inferior scar..no ischemia..EF 51%  . Dyslipidemia     takes Atorvastatin daily  . Internal hemorrhoids   . Gout     takes Allopurinol daily and Colchicine as needed  . Seasonal allergic rhinitis   . Right carotid bruit   . Cyst of nasopharynx     per ENT Wilburn Cornelia  . Myocardial infarction [facility]) 58yr ago  . Peripheral edema     takes Lasix daily  . HTN (hypertension)     takes Cardura,Metoprolol,[name],and Amlodipine daily  . Arthritis   . GERD (gastroesophageal reflux disease)     takes Omeprazole daily  . History of colon polyps   . Type 2 diabetes, uncontrolled, with neuropathy ([place] 1989    takes Invokana daily and has an insulin pump ([name]  . 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 HCAP (healthcare-associated pneumonia)   . Hemiparesis affecting right side as late effect of cerebrovascular accident ([place] 09/05/2014  . Anterior cerebral circulation hemorrhagic infarction ([facility] 11/24/2014   March, 2016, dominant left thalamic and left internal capsule ischemic infarct with resultant hemorrhagic transformation, secondary to small vessel disease. Resulted in right hemiparesis dysarthria and diplopia   //   readmission with aphasia July, 2016, this improved     Past Surgical History  Procedure Laterality Date  . Cataract extraction Bilateral 2010  . Elbow surgery Right 1997  . Balloon angioplasty, artery  1992, 2004    CAD, Dr. KRon Parker . Colonoscopy  04/03/2002    adenomatous polyp, int hemorrhoids  . Colonoscopy  07/18/2007    normal (Dr. SFuller Plan  . Rotator cuff repair  09/2011    left, with subacromial decompression  . Nasal septum surgery    . Colonoscopy  09/26/2012    tubular adenoma, sm int hem, rpt 5 yrs (Fuller Plan  . Tonsillectomy and adenoidectomy    . Eye lids raised    . Cardiac catheterization  2004  . Polypectomy N/A 05/27/2013    Procedure: ENDOSCOPIC NASOPHARYNGEAL MASS;  Surgeon: DJerrell Belfast MD    There were no vitals filed for this visit.  Visit Diagnosis:  Right hemiparesis ([facility]  Decreased coordination  Stiffness of right shoulder joint  Cognitive deficits following cerebral infarction      Subjective Assessment - 02/26/15 0830    Patient Stated Goals utilization of right arm "I wish I had more control of it"   Currently in Pain? No/denies  Treatment:    AAROM;Seated with UE Ranger and tilted stool, mod facilitation due to sensory impairment i min cues for shoulder hike  Weightbearing through elbow and hand with body on arm movements, mod facilitation, mod difficulty due to poor sensation/ body awareness   Other Exercises 1  focus on controlled movements to place hand on mat and table with mod cues for shoulder hike (decr with cueing, but difficult for pt to avoid due to decr control.  Also controlled slide on table to lightly grasp objects with mod cues for compensation and min-mod difficulty.    Other Grasp and Release Exercises    Flipping large cards with mod cues/difficulty for controlled movement and to avoid compensation patterns.                        OT Short Term Goals - 02/23/15 1104    OT SHORT TERM GOAL #1   Title Pt will be independent with initial HEP.--check STGs 02/10/15   Time 4   Period Weeks   Status Partially Met   OT SHORT TERM GOAL #2   Title Pt will use RUE as gross A/stabilizer at least 25% of the time for ADLs.   Time 4   Period Weeks   Status Achieved  02/23/15     OT SHORT TERM GOAL #3   Title Pt will demo at least 55* R shoulder flex with min compensation in prep for functional reach.   Baseline 45*   Time 4   Period Weeks   Status On-going   OT SHORT TERM GOAL #4   Title Pt will be able to complete clothing fasteners (buttons, tying shoes) mod I using AE prn.   Time 4   Period Weeks   Status Achieved  02/23/15           OT Long Term Goals - 02/23/15 1114    OT LONG TERM GOAL #1   Title Pt will be independent with updated HEP.--check LTGs 03/13/15   Time 8   Period Weeks   Status On-going   OT LONG TERM GOAL #2   Title Pt will use RUE as gross A/stabilizer for ADLs at least 50% of the time.   Time 8   Period Weeks   Status On-going   OT LONG TERM GOAL #3   Title Pt will demo at least 65* R shoulder flex with min compensation in prep for functional reach.   Baseline 45*   Time 8   Period Weeks   Status On-going   OT LONG TERM GOAL #4   Title Pt will improve RUE functional reaching/coordination for ADLs as shown by improving score on box and blocks test to at least 15 blocks.   Baseline 9 blocks   Time 8   Period Weeks   Status On-going   OT LONG TERM GOAL #5   Title Pt will perform bathing with supervision.   Time 8   Period Weeks   Status Achieved   OT LONG TERM GOAL #6   Title Pt will verbalize understanding of cognitive/visual compensation strategies and activities to improve cognition/vision prn.   Time 8   Period Weeks   Status  On-going               Plan - 02/26/15 0831    Clinical Impression Statement Pt is progressing towards goals yet he is limited by weakness and sensory impairment in RUE.   Pt will benefit from  skilled therapeutic intervention in order to improve on the following deficits (Retired) Decreased cognition;Decreased mobility;Decreased strength;Impaired vision/preception;Impaired UE functional use;Decreased knowledge of use of DME;Decreased balance;Impaired tone;Impaired sensation;Decreased coordination;Decreased range of motion   Rehab Potential Good   Clinical Impairments Affecting Rehab Potential severity of deficits, sensory deficits   OT Frequency 2x / week   OT Duration 8 weeks   OT Treatment/Interventions Self-care/ADL training;Therapeutic exercise;Functional Mobility Training;Patient/family education;Ultrasound;Neuromuscular education;Manual Therapy;Splinting;Therapeutic exercises;Cryotherapy;Parrafin;DME and/or AE instruction;Therapeutic activities;Cognitive remediation/compensation;Visual/perceptual remediation/compensation;Passive range of motion;Moist Heat;Contrast Bath;Fluidtherapy;Electrical Stimulation   Plan NMR, RUE functional use along tabletop   Consulted and Agree with Plan of Care Patient        Problem List Patient Active Problem List   Diagnosis Date Noted  . Anterior cerebral circulation hemorrhagic infarction ([place]) 11/24/2014  . Hemiparesis affecting right side as late effect of cerebrovascular accident ([place]) 09/05/2014  . Depression due to stroke ([place]) 09/05/2014  . Thrombocytopenia ([place]) 06/05/2014  . Chronic diastolic heart failure, NYHA class 1 ([place]) 06/05/2014  . Ex-smoker 06/03/2014  . Benign paroxysmal positional vertigo 04/28/2014  . Right carotid bruit   . Polyneuropathy, diabetic ([place]) 08/05/2012  . Seasonal allergic rhinitis   . Trigger thumb of left hand 10/12/2011  . Gout   . Adenomatous polyps   . CAD (coronary artery disease)   . Essential  hypertension   . Hyperlipidemia   . Type 2 diabetes, uncontrolled, with neuropathy ([place])   . Ejection fraction     Hardeep Reetz 02/26/2015, 8:35 AM Theone Murdoch, OTR/L [phone] Phone: [phone] 8:35 AM 02/26/2015 [facility] [address] [place], [place], [address] Phone: [phone]   Fax:  [phone]  Name: Jeffrey [name] MRN: [id number] Date of Birth: [birth year]

## 2015-03-03 ENCOUNTER — Ambulatory Visit: Payer: Medicare Other | Admitting: Occupational Therapy

## 2015-03-03 ENCOUNTER — Ambulatory Visit: Payer: Medicare Other | Admitting: Rehabilitation

## 2015-03-03 ENCOUNTER — Encounter: Payer: Self-pay | Admitting: Rehabilitation

## 2015-03-03 DIAGNOSIS — R2681 Unsteadiness on feet: Secondary | ICD-10-CM

## 2015-03-03 DIAGNOSIS — R2689 Other abnormalities of gait and mobility: Secondary | ICD-10-CM

## 2015-03-03 DIAGNOSIS — G8191 Hemiplegia, unspecified affecting right dominant side: Secondary | ICD-10-CM

## 2015-03-03 DIAGNOSIS — M6281 Muscle weakness (generalized): Secondary | ICD-10-CM

## 2015-03-03 DIAGNOSIS — M25611 Stiffness of right shoulder, not elsewhere classified: Secondary | ICD-10-CM

## 2015-03-03 DIAGNOSIS — R278 Other lack of coordination: Secondary | ICD-10-CM

## 2015-03-03 DIAGNOSIS — I69351 Hemiplegia and hemiparesis following cerebral infarction affecting right dominant side: Secondary | ICD-10-CM

## 2015-03-03 DIAGNOSIS — R279 Unspecified lack of coordination: Principal | ICD-10-CM

## 2015-03-03 DIAGNOSIS — R269 Unspecified abnormalities of gait and mobility: Secondary | ICD-10-CM

## 2015-03-03 NOTE — Therapy (Signed)
[facility] [address] [place], [place], [address] Phone: [phone]   Fax:  [phone]  Physical Therapy Treatment  Patient Details  Name: Jeffrey [name] MRN: [id number] Date of Birth: 06-Apr-1940 Referring Provider: Leonie Man  Encounter Date: 03/03/2015      PT End of Session - 03/03/15 1501    Visit Number 14   Number of Visits 17   Date for PT Re-Evaluation 03/08/15   Authorization Type Blue Medicare HMO-G- code every 10th visit   PT Start Time 1500   PT Stop Time 1545   PT Time Calculation (min) 45 min   Equipment Utilized During Treatment Gait belt   Activity Tolerance Patient tolerated treatment well   Behavior During Therapy [facility] for tasks assessed/performed      Past Medical History  Diagnosis Date  . CAD (coronary artery disease)     PCI distal RCA ...2004, residual 70% LAD   /   ...nuclear...03/2007...no ischemia.Marland KitchenMarland Kitchenpreserved LV /  nuclear...03/03/2009...inferior scar..no ischemia..EF 51%  . Dyslipidemia     takes Atorvastatin daily  . Internal hemorrhoids   . Gout     takes Allopurinol daily and Colchicine as needed  . Seasonal allergic rhinitis   . Right carotid bruit   . Cyst of nasopharynx     per ENT Wilburn Cornelia  . Myocardial infarction [facility]) 60yr ago  . Peripheral edema     takes Lasix daily  . HTN (hypertension)     takes Cardura,Metoprolol,[name],and Amlodipine daily  . Arthritis   . GERD (gastroesophageal reflux disease)     takes Omeprazole daily  . History of colon polyps   . Type 2 diabetes, uncontrolled, with neuropathy ([place] 1989    takes Invokana daily and has an insulin pump ([name]  . 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 HCAP (healthcare-associated pneumonia)   . Hemiparesis affecting right side as late effect of cerebrovascular accident ([place] 09/05/2014  . Anterior cerebral circulation hemorrhagic infarction ([facility]  11/24/2014    March, 2016, dominant left thalamic and left internal capsule ischemic infarct with resultant hemorrhagic transformation, secondary to small vessel disease. Resulted in right hemiparesis dysarthria and diplopia   //   readmission with aphasia July, 2016, this improved     Past Surgical History  Procedure Laterality Date  . Cataract extraction Bilateral 2010  . Elbow surgery Right 1997  . Balloon angioplasty, artery  1992, 2004    CAD, Dr. KRon Parker . Colonoscopy  04/03/2002    adenomatous polyp, int hemorrhoids  . Colonoscopy  07/18/2007    normal (Dr. SFuller Plan  . Rotator cuff repair  09/2011    left, with subacromial decompression  . Nasal septum surgery    . Colonoscopy  09/26/2012    tubular adenoma, sm int hem, rpt 5 yrs (Fuller Plan  . Tonsillectomy and adenoidectomy    . Eye lids raised    . Cardiac catheterization  2004  . Polypectomy N/A 05/27/2013    Procedure: ENDOSCOPIC NASOPHARYNGEAL MASS;  Surgeon: DJerrell Belfast MD    There were no vitals filed for this visit.  Visit Diagnosis:  Abnormality of gait  Right hemiparesis (HCC)  Unsteadiness  Muscle weakness of lower extremity  Hemiparesis affecting right side as late effect of cerebrovascular accident ([place]  Decreased functional mobility      Subjective Assessment - 03/03/15 1500    Subjective "I walked in today and she didin't park close either."  Patient is accompained by: Family member   Pertinent History * Blood glucose may drop when pt has 2 therapies in a row. Pt's family packs bag of snacks to eat if pt feels hypoglycemic.   Patient Stated Goals Pt's goal for therapy is to avoid losing the strength he has regained since March.  He would like to be mobile without walker.   Currently in Pain? 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  Simulated pt ambulating to podium (will need  to simulate stairs at some point), standing at podium for 30 mins while giving sermon, flipping pages in book to simulate Bible, intermittent attention to RUE, weight shifting to the R, and taking drink of water when needed while preaching.  Pt able to perform for 30 straight minutes without sitting and was able to flip through pages, sip water as needed at S level.  Min cues intermittently for attention to RUE and weight shifting to the R as not to over fatigue L side of body when standing.    Self Care:  Discussed how to modify preaching during first few times to limit to perhaps 20 mins to allow for external factors that may cause fatigue (being in front of large audience, under bright lights, using microphone, planning for mistakes-dropped paper? Etc).  Pt and family verbalized understanding.  Also discussed adding this to new set of LTGs to demonstrate improved activity tolerance and endurance to return to leisure activity.  Pt verbalized understanding.                      PT Education - 03/03/15 1501    Education provided Yes   Education Details preparing to give sermon at church, how to plan for stairs at church, pacing himself to last for full sermon.    Person(s) Educated Patient;Spouse;Child(ren)   Methods Explanation   Comprehension Verbalized understanding          PT Short Term Goals - 02/03/15 1324    PT SHORT TERM GOAL #1   Title Pt will perform HEP with family assistance/supervision, for lower extremitiy strengthening.  TARGET 02/06/15   Time 4   Period Weeks   Status Achieved   PT SHORT TERM GOAL #2   Title Pt will perform sit<>stand transfers with supervision, for improved efficiency and safety with transfers.   Baseline Met 11/22.   Time 4   Period Weeks   Status Achieved   PT SHORT TERM GOAL #3   Title Pt will improve TUG score to less than or equal to 70 seconds for decreased fall risk.   Baseline 52.62 seconds with R AFO and heel wedge;73 seconds  without AFO   Time 4   Period Weeks   Status Achieved   PT SHORT TERM GOAL #4   Title Pt will ambulate at least 150 ft using least restrictive assistive device with minimal assistance for improved gait efficiency.   Baseline Met 11/22.   Time 4   Period Weeks   Status Achieved   PT SHORT TERM GOAL #5   Title Pt will verbalize understanding of CVA education.   Time 4   Period Weeks   Status Achieved           PT Long Term Goals - 01/08/15 1311    PT  LONG TERM GOAL #1   Title Pt will verbalize understanding of fall prevention within home environment.  TARGET 03/08/15   Time 8   Period Weeks   Status New   PT LONG TERM GOAL #2   Title Pt will improve Timed Up and GO score to less than or equal to 50 seconds for decreased fall risk.   Time 8   Period Weeks   Status New   PT LONG TERM GOAL #3   Title Pt will improve gait velocity to at least 0.8 ft/sec for improved gait efficiency and safety.   Time 8   Period Weeks   Status New   PT LONG TERM GOAL #4   Title Berg Balance score to be assessed, with pt improving Berg score by at least 8 points.   Time 8   Period Weeks   Status New   PT LONG TERM GOAL #5   Title Pt will ambulate at least 250 ft. using least restrictive assistive device with supervision for improved efficiency and safety with gait.   Time 8   Period Weeks   Additional Long Term Goals   Additional Long Term Goals Yes   PT LONG TERM GOAL #6   Title Pt will verbalize plans for continued community fitness upon D/C from PT.   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  PT able to simulate podium during session and had pt practice flipping pages (to simulate Bible) and provided water as needed.  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  Pt and family verbalized understanding.    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 Anterior cerebral circulation hemorrhagic infarction ([place]) 11/24/2014  . Hemiparesis affecting right side as late effect of cerebrovascular accident ([place]) 09/05/2014  . Depression due to stroke ([place]) 09/05/2014  . Thrombocytopenia ([place]) 06/05/2014  . Chronic diastolic heart failure, NYHA class 1 ([place]) 06/05/2014  . Ex-smoker 06/03/2014  . Benign paroxysmal positional vertigo 04/28/2014  . Right carotid bruit   . Polyneuropathy, diabetic ([place]) 08/05/2012  . Seasonal allergic rhinitis   . Trigger thumb of left hand 10/12/2011  . Gout   . Adenomatous polyps   . CAD (coronary artery disease)   . Essential hypertension   . Hyperlipidemia   . Type 2 diabetes, uncontrolled, with neuropathy ([place])   . Ejection fraction     Cameron Sprang, PT, MPT [facility]  [facility] [address] [place], [place], [address] Phone: [phone]   Fax:  [phone] 03/03/2015, 4:54 PM  Name:  Jeffrey [name] MRN: [id number] Date of Birth: 1940/09/01

## 2015-03-03 NOTE — Therapy (Signed)
[facility] [address] [place], [place], [address] Phone: [phone]   Fax:  [phone]  Occupational Therapy Treatment  Patient Details  Name: Jeffrey [name] MRN: [id number] Date of Birth: [birth year] Referring Provider: Dr. Leonie Man  Encounter Date: 03/03/2015      OT End of Session - 03/03/15 1358    Visit Number 13   Number of Visits 17   Date for OT Re-Evaluation 03/13/15   Authorization Type Blue [employer] HMO, no visit limit/no auth, G-code   Authorization - Visit Number 13   Authorization - Number of Visits 20   OT Start Time 0037   OT Stop Time 1450   OT Time Calculation (min) 45 min   Activity Tolerance Patient tolerated treatment well   Behavior During Therapy [facility] for tasks assessed/performed      Past Medical History  Diagnosis Date  . CAD (coronary artery disease)     PCI distal RCA ...2004, residual 70% LAD   /   ...nuclear...03/2007...no ischemia.Marland KitchenMarland Kitchenpreserved LV /  nuclear...03/03/2009...inferior scar..no ischemia..EF 51%  . Dyslipidemia     takes Atorvastatin daily  . Internal hemorrhoids   . Gout     takes Allopurinol daily and Colchicine as needed  . Seasonal allergic rhinitis   . Right carotid bruit   . Cyst of nasopharynx     per ENT Wilburn Cornelia  . Myocardial infarction [facility]) 7yr ago  . Peripheral edema     takes Lasix daily  . HTN (hypertension)     takes Cardura,Metoprolol,[name],and Amlodipine daily  . Arthritis   . GERD (gastroesophageal reflux disease)     takes Omeprazole daily  . History of colon polyps   . Type 2 diabetes, uncontrolled, with neuropathy ([place] 1989    takes Invokana daily and has an insulin pump ([name]  . 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 HCAP (healthcare-associated pneumonia)   . Hemiparesis affecting right side as late effect of [place] 09[place]  . Anterior cerebral  circulation hemorrhagic infarction ([facility] 11/24/2014    March, 2016, dominant left thalamic and left internal capsule ischemic infarct with resultant hemorrhagic transformation, secondary to small vessel disease. Resulted in right hemiparesis dysarthria and diplopia   //   readmission with aphasia July, 2016, this improved     Past Surgical History  Procedure Laterality Date  . Cataract extraction Bilateral 2010  . Elbow surgery Right 1997  . Balloon angioplasty, artery  1992, 2004    CAD, Dr. KRon Parker . Colonoscopy  04/03/2002    adenomatous polyp, int hemorrhoids  . Colonoscopy  07/18/2007    normal (Dr. SFuller Plan  . Rotator cuff repair  09/2011    left, with subacromial decompression  . Nasal septum surgery    . Colonoscopy  09/26/2012    tubular adenoma, sm int hem, rpt 5 yrs (Fuller Plan  . Tonsillectomy and adenoidectomy    . Eye lids raised    . Cardiac catheterization  2004  . Polypectomy N/A 05/27/2013    Procedure: ENDOSCOPIC NASOPHARYNGEAL MASS;  Surgeon: DJerrell Belfast MD    There were no vitals filed for this visit.  Visit Diagnosis:  Decreased coordination  Right hemiparesis ([place]  Stiffness of right shoulder joint                    OT Treatments/Exercises (OP) - 03/03/15 0001    ADLs   Functional Mobility Walking with RW  in gym during transition between activities with supervision, particularly with thresholds/floor transitions, but no LOB.  Pt demo good control of RUE on walker with weightbearing through RW.  (ambulated with incr time)   Writing With forearm supported on table.  Attempted to write name using built-up grip with approx 25-45% legbility (varies).   Then, tracing triangles with mod-max difficulty for incr control.  Also attempted with weighted pen, but pt did better with regular built up grip without weight.   Neurological Re-education Exercises   Shoulder Flexion AAROM;Supine;Both;Seated  with PVC frame, then seated with min-mod facilitation    Elbow Extension AAROM;Both;Supine;AROM  with PVC frame   Seated with weight on hand with arm on body movements for incr UE stability/control with min facilitation/cues                OT Education - 03/03/15 1513    Education Details Memory Compensation Strategies; Cognitive/Visual Activities/HEP; Coordination HEP--see pt instructions   Person(s) Educated Patient   Methods Explanation;Handout;Verbal cues   Comprehension Verbalized understanding          OT Short Term Goals - 02/23/15 1104    OT SHORT TERM GOAL #1   Title Pt will be independent with initial HEP.--check STGs 02/10/15   Time 4   Period Weeks   Status Partially Met   OT SHORT TERM GOAL #2   Title Pt will use RUE as gross A/stabilizer at least 25% of the time for ADLs.   Time 4   Period Weeks   Status Achieved  02/23/15     OT SHORT TERM GOAL #3   Title Pt will demo at least 55* R shoulder flex with min compensation in prep for functional reach.   Baseline 45*   Time 4   Period Weeks   Status On-going   OT SHORT TERM GOAL #4   Title Pt will be able to complete clothing fasteners (buttons, tying shoes) mod I using AE prn.   Time 4   Period Weeks   Status Achieved  02/23/15           OT Long Term Goals - 02/23/15 1114    OT LONG TERM GOAL #1   Title Pt will be independent with updated HEP.--check LTGs 03/13/15   Time 8   Period Weeks   Status On-going   OT LONG TERM GOAL #2   Title Pt will use RUE as gross A/stabilizer for ADLs at least 50% of the time.   Time 8   Period Weeks   Status On-going   OT LONG TERM GOAL #3   Title Pt will demo at least 65* R shoulder flex with min compensation in prep for functional reach.   Baseline 45*   Time 8   Period Weeks   Status On-going   OT LONG TERM GOAL #4   Title Pt will improve RUE functional reaching/coordination for ADLs as shown by improving score on box and blocks test to at least 15 blocks.   Baseline 9 blocks   Time 8   Period Weeks    Status On-going   OT LONG TERM GOAL #5   Title Pt will perform bathing with supervision.   Time 8   Period Weeks   Status Achieved   OT LONG TERM GOAL #6   Title Pt will verbalize understanding of cognitive/visual compensation strategies and activities to improve cognition/vision prn.   Time 8   Period Weeks   Status On-going  Plan - 03/03/15 1553    Clinical Impression Statement Pt is progressing towards goals, but decr control/ataxia limits RUE functional use.   Plan neuro re-ed, RUE functional use, focus on control and decr compensation   OT Home Exercise Plan Education provided:  initial HEP issued; 03/03/15 memory compensations, cognitive/visual HEP, coordination HEP issued   Consulted and Agree with Plan of Care Patient;Family member/caregiver   Family Member Consulted wife, dtr        Problem List Patient Active Problem List   Diagnosis Date Noted  . Anterior cerebral circulation hemorrhagic infarction ([place]) 11/24/2014  . Hemiparesis affecting right side as late effect of cerebrovascular accident ([place]) 09/05/2014  . Depression due to stroke ([place]) 09/05/2014  . Thrombocytopenia ([place]) 06/05/2014  . Chronic diastolic heart failure, NYHA class 1 ([place]) 06/05/2014  . Ex-smoker 06/03/2014  . Benign paroxysmal positional vertigo 04/28/2014  . Right carotid bruit   . Polyneuropathy, diabetic ([place]) 08/05/2012  . Seasonal allergic rhinitis   . Trigger thumb of left hand 10/12/2011  . Gout   . Adenomatous polyps   . CAD (coronary artery disease)   . Essential hypertension   . Hyperlipidemia   . Type 2 diabetes, uncontrolled, with neuropathy ([place])   . Ejection fraction     [facility] 03/03/2015, 4:01 PM  [facility] [address] [place], [place], [address] Phone: [phone]   Fax:  [phone]  Name: Jeffrey [name] MRN: [id number] Date of Birth: 1941/01/12  Vianne Bulls,  OTR/L 03/03/2015 4:01 PM

## 2015-03-03 NOTE — Patient Instructions (Signed)
Memory Compensation Strategies  1. Use "WARM" strategy.  W= write it down  A= associate it  R= repeat it  M= make a mental note  2.   You can keep a Social worker.  Use a 3-ring notebook with sections for the following: calendar, important names and phone numbers,  medications, doctors' names/phone numbers, lists/reminders, and a section to journal what you did  each day.   3.    Use a calendar to write appointments down.  4.    Write yourself a schedule for the day.  This can be placed on the calendar or in a separate section of the Memory Notebook.  Keeping a  regular schedule can help memory.  5.    Use medication organizer with sections for each day or morning/evening pills.  You may need help loading it  6.    Keep a basket, or pegboard by the door.  Place items that you need to take out with you in the basket or on the pegboard.  You may also want to  include a message board for reminders.  7.    Use sticky notes.  Place sticky notes with reminders in a place where the task is performed.  For example: " turn off the  stove" placed by the stove, "lock the door" placed on the door at eye level, " take your medications" on  the bathroom mirror or by the place where you normally take your medications.  8.    Use alarms/timers.  Use while cooking to remind yourself to check on food or as a reminder to take your medicine, or as a  reminder to make a call, or as a reminder to perform another task, etc.    Visual/Cognitive Activities: 1. Simple word search 2. Reading and discussing article 3. Work simple jigsaw puzzles 4. Play simple matching games online/tablet that make you search visually. 5.  Play board/card games (memory/matching, solitaire, connect 4, etc.) 6.  Simple mazes/dot-to-dot     Coordination Activities:  1. Try typing with index finger 2. Practice writing name 3. Color simple pictures

## 2015-03-05 ENCOUNTER — Ambulatory Visit: Payer: Medicare Other | Admitting: Physical Therapy

## 2015-03-05 ENCOUNTER — Ambulatory Visit: Payer: Medicare Other | Admitting: Occupational Therapy

## 2015-03-05 DIAGNOSIS — R278 Other lack of coordination: Secondary | ICD-10-CM

## 2015-03-05 DIAGNOSIS — G8191 Hemiplegia, unspecified affecting right dominant side: Secondary | ICD-10-CM

## 2015-03-05 DIAGNOSIS — R269 Unspecified abnormalities of gait and mobility: Secondary | ICD-10-CM

## 2015-03-05 DIAGNOSIS — I69319 Unspecified symptoms and signs involving cognitive functions following cerebral infarction: Secondary | ICD-10-CM

## 2015-03-05 DIAGNOSIS — R279 Unspecified lack of coordination: Secondary | ICD-10-CM

## 2015-03-05 DIAGNOSIS — R2681 Unsteadiness on feet: Secondary | ICD-10-CM

## 2015-03-05 DIAGNOSIS — M6281 Muscle weakness (generalized): Secondary | ICD-10-CM | POA: Diagnosis not present

## 2015-03-05 DIAGNOSIS — M25611 Stiffness of right shoulder, not elsewhere classified: Secondary | ICD-10-CM

## 2015-03-05 DIAGNOSIS — R2689 Other abnormalities of gait and mobility: Secondary | ICD-10-CM

## 2015-03-05 NOTE — Patient Instructions (Signed)
Hamstring Stretch, Seated (Strap, Two Chairs)    Sit with LEFT leg extended onto facing chair (a small step may be a more appropriate stretch).  Lean forward slightly until you feel a stretch in the back of the leg. - Hold for 60 seconds. Do this 2-3 times per day. - Loop sheet or a belt over outstretched foot (as pictured). Pull gently until a stretch is felt in the left calf. Hold for 60 seconds, 2-3 times on the LEFT.

## 2015-03-05 NOTE — Therapy (Signed)
[facility] [address] [place], [place], [address] Phone: [phone]   Fax:  [phone]  Physical Therapy Treatment  Patient Details  Name: Jeffrey [name] MRN: [id number] Date of Birth: 1940-05-09 Referring Provider: Leonie Man  Encounter Date: 03/05/2015      PT End of Session - 03/05/15 1859    Visit Number 15   Number of Visits 17   Date for PT Re-Evaluation 03/08/15   Authorization Type Blue Medicare HMO-G- code every 10th visit   PT Start Time 1408   PT Stop Time 1452   PT Time Calculation (min) 44 min   Equipment Utilized During Treatment Gait belt   Activity Tolerance Patient tolerated treatment well   Behavior During Therapy [facility] for tasks assessed/performed      Past Medical History  Diagnosis Date  . CAD (coronary artery disease)     PCI distal RCA ...2004, residual 70% LAD   /   ...nuclear...03/2007...no ischemia.Marland KitchenMarland Kitchenpreserved LV /  nuclear...03/03/2009...inferior scar..no ischemia..EF 51%  . Dyslipidemia     takes Atorvastatin daily  . Internal hemorrhoids   . Gout     takes Allopurinol daily and Colchicine as needed  . Seasonal allergic rhinitis   . Right carotid bruit   . Cyst of nasopharynx     per ENT Wilburn Cornelia  . Myocardial infarction [facility]  . Peripheral edema     takes Lasix daily  . HTN (hypertension)     takes Cardura,Metoprolol,[name],and Amlodipine daily  . Arthritis   . GERD (gastroesophageal reflux disease)     takes Omeprazole daily  . History of colon polyps   . Type 2 diabetes, uncontrolled, with neuropathy ([place] 1989    takes Invokana daily and has an insulin pump ([name]  . 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 HCAP (healthcare-associated pneumonia)   . Hemiparesis affecting right side as late effect of cerebrovascular accident ([place] 09/05/2014  . Anterior cerebral circulation hemorrhagic infarction ([facility]  11/24/2014    March, 2016, dominant left thalamic and left internal capsule ischemic infarct with resultant hemorrhagic transformation, secondary to small vessel disease. Resulted in right hemiparesis dysarthria and diplopia   //   readmission with aphasia July, 2016, this improved     Past Surgical History  Procedure Laterality Date  . Cataract extraction Bilateral 2010  . Elbow surgery Right 1997  . Balloon angioplasty, artery  1992, 2004    CAD, Dr. KRon Parker . Colonoscopy  04/03/2002    adenomatous polyp, int hemorrhoids  . Colonoscopy  07/18/2007    normal (Dr. SFuller Plan  . Rotator cuff repair  09/2011    left, with subacromial decompression  . Nasal septum surgery    . Colonoscopy  09/26/2012    tubular adenoma, sm int hem, rpt 5 yrs (Fuller Plan  . Tonsillectomy and adenoidectomy    . Eye lids raised    . Cardiac catheterization  2004  . Polypectomy N/A 05/27/2013    Procedure: ENDOSCOPIC NASOPHARYNGEAL MASS;  Surgeon: DJerrell Belfast MD    There were no vitals filed for this visit.  Visit Diagnosis:  Abnormality of gait  Right hemiparesis ([place]  Unsteadiness      Subjective Assessment - 03/05/15 1410    Subjective Pt reports mild soreness in L calf/hamstring today. See below for pt reply when asked about current therapy goals.   Pertinent History Blood glucose may drop when pt has 2 therapies in a  row. Pt's family packs bag of snacks to eat if pt feels hypoglycemic.   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   Currently in Pain? No/denies                         [facility] Adult PT Treatment/Exercise - 03/05/15 0001    Transfers   Transfers Sit to Stand;Stand to Sit   Sit to Stand 5: Supervision   Sit to Stand Details (indicate cue type and reason) (S) for stability/balance while placing hand in R hand  orthosis   Stand to Sit 5: Supervision   Ambulation/Gait   Ambulation/Gait Yes   Ambulation/Gait Assistance 5: Supervision;4: Min guard   Ambulation/Gait Assistance Details Efficiency of ambulation limited by pt difficulty advancing RW and stepping simultaneously. When PT provided cueing for this, pt required min guard for stability/balance and exhibited decreased RLE clearance. Min cueing also provided to maintain RW in contact iwht ground during turns and with negotiation of surface changes.   Ambulation Distance (Feet) 375 Feet   Assistive device Rolling walker;Other (Comment)  R AFO and R hand orthosis   Gait Pattern Step-through pattern;Decreased stance time - right;Decreased step length - left;Decreased weight shift to right;Ataxic;Abducted- right  R recurvatum only significant when ascending ramp   Ambulation Surface Level;Indoor   Gait velocity 1.34 ft/sec   Stairs Yes   Stairs Assistance 5: Supervision;4: Min guard   Stairs Assistance Details (indicate cue type and reason) During ascent, required (S)/cueing for attention to R knee (to prevent high velocity recurvatum); During descent, min guard required due to difficulty with RLE placement on step secondary to impaired coordination. Discussed, demonstrated how pt would ascend 2-3 stairs into pulpit with L rail with focus on daughter carrying RW from bottom to top of stairs upon entry, from top to bottom upon departure from pulpit.   Stair Management Technique One rail Left;Step to pattern;Forwards   Number of Stairs 8   Height of Stairs 6   Berg Balance Test   Sit to Stand Able to stand without using hands and stabilize independently   Standing Unsupported Able to stand safely 2 minutes   Sitting with Back Unsupported but Feet Supported on Floor or Stool Able to sit safely and securely 2 minutes   Stand to Sit Sits safely with minimal use of hands   Timed Up and Go Test   TUG Normal TUG   Normal TUG (seconds) 27.95  with RW, R  hand orthosis, and R AFO   Exercises   Exercises Other Exercises   Other Exercises  To address soreness in L hamstrings and calves, PT explained and demonstrated self-stretch of L hamstring and L gastrocnemius using belt with effective return demo from pt x2 minute holds each. See Pt Instructions for details.                PT Education - 03/05/15 1848    Education provided Yes   Education Details Long term goals, findings, and progress. HEP: added hamstring/gastroc stretch to address soreness.   Person(s) Educated Patient   Methods Explanation;Demonstration;Verbal cues;Handout   Comprehension Verbalized understanding;Returned demonstration          PT Short Term Goals - 02/03/15 1324    PT SHORT TERM GOAL #1   Title Pt will  perform HEP with family assistance/supervision, for lower extremitiy strengthening.  TARGET 02/06/15   Time 4   Period Weeks   Status Achieved   PT SHORT TERM GOAL #2   Title Pt will perform sit<>stand transfers with supervision, for improved efficiency and safety with transfers.   Baseline Met 11/22.   Time 4   Period Weeks   Status Achieved   PT SHORT TERM GOAL #3   Title Pt will improve TUG score to less than or equal to 70 seconds for decreased fall risk.   Baseline 52.62 seconds with R AFO and heel wedge;73 seconds without AFO   Time 4   Period Weeks   Status Achieved   PT SHORT TERM GOAL #4   Title Pt will ambulate at least 150 ft using least restrictive assistive device with minimal assistance for improved gait efficiency.   Baseline Met 11/22.   Time 4   Period Weeks   Status Achieved   PT SHORT TERM GOAL #5   Title Pt will verbalize understanding of CVA education.   Time 4   Period Weeks   Status Achieved           PT Long Term Goals - 03/05/15 1425    PT LONG TERM GOAL #1   Title Pt will verbalize understanding of fall prevention within home environment.  TARGET 03/08/15   Time 8   Period Weeks   Status On-going   PT LONG  TERM GOAL #2   Title Pt will improve Timed Up and GO score to less than or equal to 50 seconds for decreased fall risk.   Baseline 12/29: TUG = 27.95 sec   Time 8   Period Weeks   Status Achieved   PT LONG TERM GOAL #3   Title Pt will improve gait velocity to at least 0.8 ft/sec for improved gait efficiency and safety.   Baseline 12/29: gait velocity = 1.34 ft/sec   Time 8   Period Weeks   Status Achieved   PT LONG TERM GOAL #4   Title Berg Balance score to be assessed, with pt improving Berg score by at least 8 points.   Time 8   Period Weeks   Status On-going   PT LONG TERM GOAL #5   Title Pt will ambulate at least 250 ft. using least restrictive assistive device with supervision for improved efficiency and safety with gait.   Time 8   Period Weeks   Status On-going   PT LONG TERM GOAL #6   Title Pt will verbalize plans for continued community fitness upon D/C from PT.   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 Pt required supervision to min guard for negotiation of 4 stairs with single L rail (to simulate pulpit entrance). Will plan to readdress in future sessions. Pt surpassed LTG's for gait velocity and TUG, suggesting increased efficiency of ambulation.    Pt will benefit from skilled therapeutic intervention in order to improve on the following deficits Abnormal gait;Decreased balance;Decreased mobility;Decreased coordination;Decreased strength;Difficulty walking;Impaired sensation;Impaired tone   Rehab Potential Good   PT Frequency 2x / week   PT Duration 8 weeks  plus eval   PT Treatment/Interventions ADLs/Self Care Home Management;Therapeutic  exercise;Therapeutic activities;Functional mobility training;Gait training;Balance training;Neuromuscular  re-education;Electrical Stimulation   PT Next Visit Plan * Finish checking LTG's and recert. 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 Continue to address RLE WB, NMR and coordination, gait w/ quad tip cane.   Consulted and Agree with Plan of Care Patient        Problem List Patient Active Problem List   Diagnosis Date Noted  . Anterior cerebral circulation hemorrhagic infarction ([place]) 11/24/2014  . Hemiparesis affecting right side as late effect of cerebrovascular accident ([place]) 09/05/2014  . Depression due to stroke ([place]) 09/05/2014  . Thrombocytopenia ([place]) 06/05/2014  . Chronic diastolic heart failure, NYHA class 1 ([place]) 06/05/2014  . Ex-smoker 06/03/2014  . Benign paroxysmal positional vertigo 04/28/2014  . Right carotid bruit   . Polyneuropathy, diabetic ([place]) 08/05/2012  . Seasonal allergic rhinitis   . Trigger thumb of left hand 10/12/2011  . Gout   . Adenomatous polyps   . CAD (coronary artery disease)   . Essential hypertension   . Hyperlipidemia   . Type 2 diabetes, uncontrolled, with neuropathy ([place])   . Ejection fraction     Billie Ruddy, PT, DPT [facility] [address] [place], [place], [address] Phone: [phone]   Fax:  [phone] 03/05/2015, 7:12 PM  Name: Jeffrey [name] MRN: [id number] Date of Birth: 01-Feb-1941

## 2015-03-05 NOTE — Therapy (Signed)
[facility] [address] [place], [place], [address] Phone: [phone]   Fax:  [phone]  Occupational Therapy Treatment  Patient Details  Name: Jeffrey [name] MRN: [id number] Date of Birth: [birth year] Referring Provider: Dr. Leonie Man  Encounter Date: 03/05/2015      OT End of Session - 03/05/15 1327    Visit Number 14   Number of Visits 17   Date for OT Re-Evaluation 03/13/15   Authorization Type Blue Medicare HMO, no visit limit/no auth, G-code   Authorization - Visit Number 14   Authorization - Number of Visits 20   OT Start Time 9379   OT Stop Time 1402   OT Time Calculation (min) 45 min   Activity Tolerance Patient tolerated treatment well   Behavior During Therapy [facility] for tasks assessed/performed      Past Medical History  Diagnosis Date  . CAD (coronary artery disease)     PCI distal RCA ...2004, residual 70% LAD   /   ...nuclear...03/2007...no ischemia.Marland KitchenMarland Kitchenpreserved LV /  nuclear...03/03/2009...inferior scar..no ischemia..EF 51%  . Dyslipidemia     takes Atorvastatin daily  . Internal hemorrhoids   . Gout     takes Allopurinol daily and Colchicine as needed  . Seasonal allergic rhinitis   . Right carotid bruit   . Cyst of nasopharynx     per ENT Wilburn Cornelia  . Myocardial infarction [facility]) 66yr ago  . Peripheral edema     takes Lasix daily  . HTN (hypertension)     takes Cardura,Metoprolol,[name],and Amlodipine daily  . Arthritis   . GERD (gastroesophageal reflux disease)     takes Omeprazole daily  . History of colon polyps   . Type 2 diabetes, uncontrolled, with neuropathy ([place] 1989    takes Invokana daily and has an insulin pump ([name]  . 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 HCAP (healthcare-associated pneumonia)   . Hemiparesis affecting right side as late effect of cerebrovascular accident ([place] 09/05/2014  . Anterior cerebral  circulation hemorrhagic infarction ([facility] 11/24/2014    March, 2016, dominant left thalamic and left internal capsule ischemic infarct with resultant hemorrhagic transformation, secondary to small vessel disease. Resulted in right hemiparesis dysarthria and diplopia   //   readmission with aphasia July, 2016, this improved     Past Surgical History  Procedure Laterality Date  . Cataract extraction Bilateral 2010  . Elbow surgery Right 1997  . Balloon angioplasty, artery  1992, 2004    CAD, Dr. KRon Parker . Colonoscopy  04/03/2002    adenomatous polyp, int hemorrhoids  . Colonoscopy  07/18/2007    normal (Dr. SFuller Plan  . Rotator cuff repair  09/2011    left, with subacromial decompression  . Nasal septum surgery    . Colonoscopy  09/26/2012    tubular adenoma, sm int hem, rpt 5 yrs (Fuller Plan  . Tonsillectomy and adenoidectomy    . Eye lids raised    . Cardiac catheterization  2004  . Polypectomy N/A 05/27/2013    Procedure: ENDOSCOPIC NASOPHARYNGEAL MASS;  Surgeon: DJerrell Belfast MD    There were no vitals filed for this visit.  Visit Diagnosis:  Right hemiparesis ([place]  Decreased coordination  Stiffness of right shoulder joint  Cognitive deficits following cerebral infarction  Decreased functional mobility  Unsteadiness      Subjective Assessment - 03/05/15 2108    Subjective  I want to talk to the powers that be  about driving again   Patient Stated Goals utilization of right arm "I wish I had more control of it"   Currently in Pain? No/denies                      OT Treatments/Exercises (OP) - 03/05/15 2114    ADLs   Driving Recommended driving eval and discussed safety concerns.  Recommended pt begin walking to car and get in/out of car without assistance (just supervision) and walking in/out of resturants/stores in prep for driving.  Pt verbalized understanding.   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 Pt with min-mod cues for compensation patterns/shoulder hike.   Seated with weight on hand with arm on body movements for incr UE stability/control with min cues                OT Education - 03/05/15 2109    Education Details Long discussion re:  safety concerns with driving (as pt will be unable to use RUE or RLE to drive and may need adaptions) including physical, cognitive, and visual requirements needed for driving, reommended (and educated about) driving evaluation/rehab prior to returning to driviing and provided pt with contact info for driving eval, also informed pt that he should obtain clearance from MD (Dr. Leonie Man) prior to returning to driving   Person(s) Educated Patient   Methods Explanation;Handout   Comprehension Verbalized understanding          OT Short Term Goals - 02/23/15 1104    OT SHORT TERM GOAL #1   Title Pt will be independent with initial HEP.--check STGs 02/10/15   Time 4   Period Weeks   Status Partially Met   OT SHORT TERM GOAL #2   Title Pt will use RUE as gross A/stabilizer at least 25% of the time for ADLs.   Time 4   Period Weeks   Status Achieved  02/23/15     OT SHORT TERM GOAL #3   Title Pt will demo at least 55* R shoulder flex with min compensation in prep for functional reach.   Baseline 45*   Time 4   Period Weeks   Status On-going   OT SHORT TERM GOAL #4   Title Pt will be able to complete clothing  fasteners (buttons, tying shoes) mod I using AE prn.   Time 4   Period Weeks   Status Achieved  02/23/15           OT Long Term Goals - 02/23/15 1114    OT LONG TERM GOAL #1   Title Pt will be independent with updated HEP.--check LTGs 03/13/15   Time 8   Period Weeks   Status On-going   OT LONG TERM GOAL #2   Title Pt will use RUE as gross A/stabilizer for ADLs at least 50% of the time.   Time 8   Period Weeks   Status On-going   OT LONG TERM GOAL #  3   Title Pt will demo at least 65* R shoulder flex with min compensation in prep for functional reach.   Baseline 45*   Time 8   Period Weeks   Status On-going   OT LONG TERM GOAL #4   Title Pt will improve RUE functional reaching/coordination for ADLs as shown by improving score on box and blocks test to at least 15 blocks.   Baseline 9 blocks   Time 8   Period Weeks   Status On-going   OT LONG TERM GOAL #5   Title Pt will perform bathing with supervision.   Time 8   Period Weeks   Status Achieved   OT LONG TERM GOAL #6   Title Pt will verbalize understanding of cognitive/visual compensation strategies and activities to improve cognition/vision prn.   Time 8   Period Weeks   Status On-going               Plan - 03/05/15 2134    Clinical Impression Statement Pt demo decr compensation (and needs decr cueing) during low-range functional reaching.   Clinical Impairments Affecting Rehab Potential severity of deficits, sensory deficits, ataxia   Plan check goals next week with plan to renew; neuro re-ed, RUE functional use   OT Home Exercise Plan Education provided:  initial HEP issued; 03/03/15 memory compensations, cognitive/visual HEP, coordination HEP issued        Problem List Patient Active Problem List   Diagnosis Date Noted  . Anterior cerebral circulation hemorrhagic infarction ([place]) 11/24/2014  . Hemiparesis affecting right side as late effect of cerebrovascular accident ([place]) 09/05/2014  .  Depression due to stroke ([place]) 09/05/2014  . Thrombocytopenia ([place]) 06/05/2014  . Chronic diastolic heart failure, NYHA class 1 ([place]) 06/05/2014  . Ex-smoker 06/03/2014  . Benign paroxysmal positional vertigo 04/28/2014  . Right carotid bruit   . Polyneuropathy, diabetic ([place]) 08/05/2012  . Seasonal allergic rhinitis   . Trigger thumb of left hand 10/12/2011  . Gout   . Adenomatous polyps   . CAD (coronary artery disease)   . Essential hypertension   . Hyperlipidemia   . Type 2 diabetes, uncontrolled, with neuropathy ([place])   . Ejection fraction     [facility] 03/05/2015, 9:42 PM  [facility] [address] [place], [place], [address] Phone: [phone]   Fax:  [phone]  Name: Jeffrey [name] MRN: [id number] Date of Birth: 01/01/1941  Vianne Bulls, OTR/L 03/05/2015 9:42 PM

## 2015-03-10 ENCOUNTER — Ambulatory Visit: Payer: PPO | Attending: Neurology | Admitting: Rehabilitation

## 2015-03-10 ENCOUNTER — Ambulatory Visit: Payer: PPO | Admitting: Occupational Therapy

## 2015-03-10 ENCOUNTER — Encounter: Payer: Self-pay | Admitting: Rehabilitation

## 2015-03-10 DIAGNOSIS — R269 Unspecified abnormalities of gait and mobility: Secondary | ICD-10-CM

## 2015-03-10 DIAGNOSIS — R279 Unspecified lack of coordination: Secondary | ICD-10-CM | POA: Insufficient documentation

## 2015-03-10 DIAGNOSIS — I69351 Hemiplegia and hemiparesis following cerebral infarction affecting right dominant side: Secondary | ICD-10-CM | POA: Insufficient documentation

## 2015-03-10 DIAGNOSIS — M6281 Muscle weakness (generalized): Secondary | ICD-10-CM

## 2015-03-10 DIAGNOSIS — R278 Other lack of coordination: Secondary | ICD-10-CM

## 2015-03-10 DIAGNOSIS — G8191 Hemiplegia, unspecified affecting right dominant side: Secondary | ICD-10-CM

## 2015-03-10 DIAGNOSIS — M25611 Stiffness of right shoulder, not elsewhere classified: Secondary | ICD-10-CM | POA: Diagnosis not present

## 2015-03-10 DIAGNOSIS — R2681 Unsteadiness on feet: Secondary | ICD-10-CM

## 2015-03-10 DIAGNOSIS — R270 Ataxia, unspecified: Secondary | ICD-10-CM | POA: Insufficient documentation

## 2015-03-10 DIAGNOSIS — R2689 Other abnormalities of gait and mobility: Secondary | ICD-10-CM

## 2015-03-10 DIAGNOSIS — H539 Unspecified visual disturbance: Secondary | ICD-10-CM | POA: Insufficient documentation

## 2015-03-10 DIAGNOSIS — I69319 Unspecified symptoms and signs involving cognitive functions following cerebral infarction: Secondary | ICD-10-CM | POA: Insufficient documentation

## 2015-03-10 NOTE — Therapy (Signed)
[facility] [address] [place], [place], [address] Phone: [phone]   Fax:  [phone]  Physical Therapy Treatment  Patient Details  Name: Jeffrey [name] MRN: [id number] Date of Birth: 08/01/1940 Referring Provider: Leonie Man  Encounter Date: 03/10/2015      PT End of Session - 03/10/15 1412    Visit Number 16   Number of Visits 25   Date for PT Re-Evaluation 04/09/15   Authorization Type Blue Medicare HMO-G- code every 10th visit   PT Start Time 1402   PT Stop Time 1445   PT Time Calculation (min) 43 min   Equipment Utilized During Treatment Gait belt   Activity Tolerance Patient tolerated treatment well   Behavior During Therapy [facility] for tasks assessed/performed      Past Medical History  Diagnosis Date  . CAD (coronary artery disease)     PCI distal RCA ...2004, residual 70% LAD   /   ...nuclear...03/2007...no ischemia.Marland KitchenMarland Kitchenpreserved LV /  nuclear...03/03/2009...inferior scar..no ischemia..EF 51%  . Dyslipidemia     takes Atorvastatin daily  . Internal hemorrhoids   . Gout     takes Allopurinol daily and Colchicine as needed  . Seasonal allergic rhinitis   . Right carotid bruit   . Cyst of nasopharynx     per ENT Wilburn Cornelia  . Myocardial infarction [facility]) 31yr ago  . Peripheral edema     takes Lasix daily  . HTN (hypertension)     takes Cardura,Metoprolol,[name],and Amlodipine daily  . Arthritis   . GERD (gastroesophageal reflux disease)     takes Omeprazole daily  . History of colon polyps   . Type 2 diabetes, uncontrolled, with neuropathy ([place] 1989    takes Invokana daily and has an insulin pump ([name]  . 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 HCAP (healthcare-associated pneumonia)   . Hemiparesis affecting right side as late effect of cerebrovascular accident ([place] 09/05/2014  . Anterior cerebral circulation hemorrhagic infarction ([facility]  11/24/2014    March, 2016, dominant left thalamic and left internal capsule ischemic infarct with resultant hemorrhagic transformation, secondary to small vessel disease. Resulted in right hemiparesis dysarthria and diplopia   //   readmission with aphasia July, 2016, this improved     Past Surgical History  Procedure Laterality Date  . Cataract extraction Bilateral 2010  . Elbow surgery Right 1997  . Balloon angioplasty, artery  1992, 2004    CAD, Dr. KRon Parker . Colonoscopy  04/03/2002    adenomatous polyp, int hemorrhoids  . Colonoscopy  07/18/2007    normal (Dr. SFuller Plan  . Rotator cuff repair  09/2011    left, with subacromial decompression  . Nasal septum surgery    . Colonoscopy  09/26/2012    tubular adenoma, sm int hem, rpt 5 yrs (Fuller Plan  . Tonsillectomy and adenoidectomy    . Eye lids raised    . Cardiac catheterization  2004  . Polypectomy N/A 05/27/2013    Procedure: ENDOSCOPIC NASOPHARYNGEAL MASS;  Surgeon: DJerrell Belfast MD    There were no vitals filed for this visit.  Visit Diagnosis:  Abnormality of gait - Plan: PT plan of care cert/re-cert  Unsteadiness - Plan: PT plan of care cert/re-cert  Decreased functional mobility - Plan: PT plan of care cert/re-cert  Decreased coordination - Plan: PT plan of care cert/re-cert  Muscle weakness of lower extremity - Plan: PT plan of care cert/re-cert  Subjective Assessment - 03/10/15 1411    Subjective Reports no changes since last visit.  No falls.    Pertinent History Blood glucose may drop when pt has 2 therapies in a row. Pt's family packs bag of snacks to eat if pt feels hypoglycemic.   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   Currently in Pain? No/denies          NMR:  Assessed BERG balance test for LTGs.  Note new score of 34/56 from 21/56 (on  01/12/15).  See full details below.  Educated on meaning of balance results and the need to continue to improve in order to decrease fall risk.    Self Care:  Discussed fall prevention strategies during session.  Feel that he would benefit from continued education and handout of strategies (did not hand out during session) to decrease fall risk.  Also continue to discuss NOT using w/c at all in home.  Pt states he is still using quite a bit in the home, esp in the mornings.  Also discussed ambulating to/from restroom at night instead of using urinal.  Will assess on next visit to determine if pt can do short distances without brace donned.   Gait:  Assessed gait for LTG as well with RW and R HO, RAFO.  Performed x 300' at S level, close to mod I level with min cues for upright posture and attention to RUE.                 [facility] Adult PT Treatment/Exercise - 03/10/15 1428    Berg Balance Test   Sit to Stand Able to stand without using hands and stabilize independently   Standing Unsupported Able to stand safely 2 minutes   Sitting with Back Unsupported but Feet Supported on Floor or Stool Able to sit safely and securely 2 minutes   Stand to Sit Sits safely with minimal use of hands   Transfers Able to transfer safely, definite need of hands   Standing Unsupported with Eyes Closed Able to stand 10 seconds with supervision   Standing Ubsupported with Feet Together Able to place feet together independently and stand for 1 minute with supervision   From Standing, Reach Forward with Outstretched Arm Can reach confidently >25 cm (10")   From Standing Position, Pick up Object from [place] to pick up shoe, needs supervision   From Standing Position, Turn to Look Behind Over each Shoulder Needs supervision when turning   Turn 360 Degrees Needs assistance while turning   Standing Unsupported, Alternately Place Feet on Step/Stool Needs assistance to keep from falling or unable to try   Standing  Unsupported, One Foot in Front Needs help to step but can hold 15 seconds   Standing on One Leg Unable to try or needs assist to prevent fall   Total Score 34                PT Education - 03/10/15 1412    Education provided Yes   Education Details fall prevention strategies.    Person(s) Educated Patient   Methods Explanation   Comprehension Verbalized understanding          PT Short Term Goals - 02/03/15 1324    PT SHORT TERM GOAL #1   Title Pt will perform HEP with  family assistance/supervision, for lower extremitiy strengthening.  TARGET 02/06/15   Time 4   Period Weeks   Status Achieved   PT SHORT TERM GOAL #2   Title Pt will perform sit<>stand transfers with supervision, for improved efficiency and safety with transfers.   Baseline Met 11/22.   Time 4   Period Weeks   Status Achieved   PT SHORT TERM GOAL #3   Title Pt will improve TUG score to less than or equal to 70 seconds for decreased fall risk.   Baseline 52.62 seconds with R AFO and heel wedge;73 seconds without AFO   Time 4   Period Weeks   Status Achieved   PT SHORT TERM GOAL #4   Title Pt will ambulate at least 150 ft using least restrictive assistive device with minimal assistance for improved gait efficiency.   Baseline Met 11/22.   Time 4   Period Weeks   Status Achieved   PT SHORT TERM GOAL #5   Title Pt will verbalize understanding of CVA education.   Time 4   Period Weeks   Status Achieved           PT Long Term Goals - 03/10/15 1413    PT LONG TERM GOAL #1   Title Pt will verbalize understanding of fall prevention within home environment.  Modified TARGET 04/07/15   Baseline Requires mod cues to recall on 03/10/15   Time 4   Period Weeks   Status On-going   PT LONG TERM GOAL #2   Title Pt will improve Timed Up and GO score to < or equal to 24.95 seconds for decreased fall risk.   Baseline 12/29: TUG = 27.95 sec   Time 4   Period Weeks   Status Revised   PT LONG TERM GOAL #3    Title Pt will improve gait velocity to 1.94 ft/sec for improved gait efficiency and safety.   Baseline 12/29: gait velocity = 1.34 ft/sec   Time 4   Period Weeks   Status Revised   PT LONG TERM GOAL #4   Title Pt will improve BERG balance score to 38/56 in order to indicate decreased fall risk.    Baseline 21/56 (01/12/15) to 34/56 on 03/10/15   Time 4   Period Weeks   Status Revised   PT LONG TERM GOAL #5   Title Pt will ambulate >300' using least restrictive assistive device at mod I level for improved efficiency and safety with gait.   Time 4   Period --   Status Revised   PT LONG TERM GOAL #6   Title Pt will verbalize plans for continued community fitness upon D/C from PT.   Time 4   Period Weeks   Status On-going               Plan - 03/10/15 1412    Clinical Impression Statement Session focused on addressing remaining LTG's.  Note that pt performed 34/56 on BERG balance test, improvement from 21/56 on 01/12/15, and has met goal of functional ambulation at S level.  Requires mod to max cues to recall fall prevention strategies.   Will revise goals as needed and recert.    Pt will benefit from skilled therapeutic intervention in order to improve on the following deficits Abnormal gait;Decreased balance;Decreased mobility;Decreased coordination;Decreased strength;Difficulty walking;Impaired sensation;Impaired tone   Rehab Potential Good   PT Frequency 2x / week   PT Duration 4 weeks   PT Treatment/Interventions ADLs/Self Care Home Management;Therapeutic  exercise;Therapeutic activities;Functional mobility training;Gait training;Balance training;Neuromuscular re-education;Electrical Stimulation   PT Next Visit Plan Check pt for gait with RW, without brace (with either gripper sock or shoe) to determine if safe to ambulate to/from restroom at mod I level.  Consider practicing entering/exiting pulpit when daughter present. Continue to address RLE WB, NMR and coordination, gait w/  quad tip cane.   Consulted and Agree with Plan of Care Patient        Problem List Patient Active Problem List   Diagnosis Date Noted  . Anterior cerebral circulation hemorrhagic infarction ([place]) 11/24/2014  . Hemiparesis affecting right side as late effect of cerebrovascular accident ([place]) 09/05/2014  . Depression due to stroke ([place]) 09/05/2014  . Thrombocytopenia ([place]) 06/05/2014  . Chronic diastolic heart failure, NYHA class 1 ([place]) 06/05/2014  . Ex-smoker 06/03/2014  . Benign paroxysmal positional vertigo 04/28/2014  . Right carotid bruit   . Polyneuropathy, diabetic ([place]) 08/05/2012  . Seasonal allergic rhinitis   . Trigger thumb of left hand 10/12/2011  . Gout   . Adenomatous polyps   . CAD (coronary artery disease)   . Essential hypertension   . Hyperlipidemia   . Type 2 diabetes, uncontrolled, with neuropathy ([place])   . Ejection fraction     Cameron Sprang, PT, MPT [facility] [address] [place], [place], [address] Phone: [phone]   Fax:  [phone] 03/10/2015, 6:00 PM  Name: Jeffrey [name] MRN: [id number] Date of Birth: 1940/12/12

## 2015-03-10 NOTE — Patient Instructions (Signed)
Fall Prevention in the Home  Falls can cause injuries and can affect people from all age groups. There are many simple things that you can do to make your home safe and to help prevent falls. WHAT CAN I DO ON THE OUTSIDE OF MY HOME?  Regularly repair the edges of walkways and driveways and fix any cracks.  Remove high doorway thresholds.  Trim any shrubbery on the main path into your home.  Use bright outdoor lighting.  Clear walkways of debris and clutter, including tools and rocks.  Regularly check that handrails are securely fastened and in good repair. Both sides of any steps should have handrails.  Install guardrails along the edges of any raised decks or porches.  Have leaves, snow, and ice cleared regularly.  Use sand or salt on walkways during winter months.  In the garage, clean up any spills right away, including grease or oil spills. WHAT CAN I DO IN THE BATHROOM?  Use night lights.  Install grab bars by the toilet and in the tub and shower. Do not use towel bars as grab bars.  Use non-skid mats or decals on the floor of the tub or shower.  If you need to sit down while you are in the shower, use a plastic, non-slip stool..  Keep the floor dry. Immediately clean up any water that spills on the floor.  Remove soap buildup in the tub or shower on a regular basis.  Attach bath mats securely with double-sided non-slip rug tape.  Remove throw rugs and other tripping hazards from the floor. WHAT CAN I DO IN THE BEDROOM?  Use night lights.  Make sure that a bedside light is easy to reach.  Do not use oversized bedding that drapes onto the floor.  Have a firm chair that has side arms to use for getting dressed.  Remove throw rugs and other tripping hazards from the floor. WHAT CAN I DO IN THE KITCHEN?   Clean up any spills right away.  Avoid walking on wet floors.  Place frequently used items in easy-to-reach places.  If you need to reach for something  above you, use a sturdy step stool that has a grab bar.  Keep electrical cables out of the way.  Do not use floor polish or wax that makes floors slippery. If you have to use wax, make sure that it is non-skid floor wax.  Remove throw rugs and other tripping hazards from the floor. WHAT CAN I DO IN THE STAIRWAYS?  Do not leave any items on the stairs.  Make sure that there are handrails on both sides of the stairs. Fix handrails that are broken or loose. Make sure that handrails are as long as the stairways.  Check any carpeting to make sure that it is firmly attached to the stairs. Fix any carpet that is loose or worn.  Avoid having throw rugs at the top or bottom of stairways, or secure the rugs with carpet tape to prevent them from moving.  Make sure that you have a light switch at the top of the stairs and the bottom of the stairs. If you do not have them, have them installed. WHAT ARE SOME OTHER FALL PREVENTION TIPS?  Wear closed-toe shoes that fit well and support your feet. Wear shoes that have rubber soles or low heels.  When you use a stepladder, make sure that it is completely opened and that the sides are firmly locked. Have someone hold the ladder while you   are using it. Do not climb a closed stepladder.  Add color or contrast paint or tape to grab bars and handrails in your home. Place contrasting color strips on the first and last steps.  Use mobility aids as needed, such as canes, walkers, scooters, and crutches.  Turn on lights if it is dark. Replace any light bulbs that burn out.  Set up furniture so that there are clear paths. Keep the furniture in the same spot.  Fix any uneven floor surfaces.  Choose a carpet design that does not hide the edge of steps of a stairway.  Be aware of any and all pets.  Review your medicines with your healthcare provider. Some medicines can cause dizziness or changes in blood pressure, which increase your risk of falling. Talk  with your health care provider about other ways that you can decrease your risk of falls. This may include working with a physical therapist or trainer to improve your strength, balance, and endurance.   This information is not intended to replace advice given to you by your health care provider. Make sure you discuss any questions you have with your health care provider.   Document Released: 02/11/2002 Document Revised: 07/08/2014 Document Reviewed: 03/28/2014 Elsevier Interactive Patient Education 2016 [employer]  

## 2015-03-10 NOTE — Therapy (Signed)
[facility] [address] [place], [place], [address] Phone: [phone]   Fax:  [phone]  Occupational Therapy Treatment  Patient Details  Name: Jeffrey [name] MRN: [id number] Date of Birth: 1940/09/22 Referring Provider: Dr. Leonie Man  Encounter Date: 03/10/2015      OT End of Session - 03/10/15 1456    Visit Number 15   Number of Visits 17   Date for OT Re-Evaluation 03/13/15   Authorization Type Blue [employer] HMO, no visit limit/no auth, G-code   Authorization - Visit Number 15   Authorization - Number of Visits 20   OT Start Time 1937   OT Stop Time 1530   OT Time Calculation (min) 39 min   Activity Tolerance Patient tolerated treatment well   Behavior During Therapy [facility] for tasks assessed/performed      Past Medical History  Diagnosis Date  . CAD (coronary artery disease)     PCI distal RCA ...2004, residual 70% LAD   /   ...nuclear...03/2007...no ischemia.Marland KitchenMarland Kitchenpreserved LV /  nuclear...03/03/2009...inferior scar..no ischemia..EF 51%  . Dyslipidemia     takes Atorvastatin daily  . Internal hemorrhoids   . Gout     takes Allopurinol daily and Colchicine as needed  . Seasonal allergic rhinitis   . Right carotid bruit   . Cyst of nasopharynx     per ENT Wilburn Cornelia  . Myocardial infarction [facility]) 68yr ago  . Peripheral edema     takes Lasix daily  . HTN (hypertension)     takes Cardura,Metoprolol,[name],and Amlodipine daily  . Arthritis   . GERD (gastroesophageal reflux disease)     takes Omeprazole daily  . History of colon polyps   . Type 2 diabetes, uncontrolled, with neuropathy ([place] 1989    takes Invokana daily and has an insulin pump ([name]  . 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 HCAP (healthcare-associated pneumonia)   . Hemiparesis affecting right side as late effect of cerebrovascular accident ([place] 09/05/2014  . Anterior cerebral  circulation hemorrhagic infarction ([facility] 11/24/2014    March, 2016, dominant left thalamic and left internal capsule ischemic infarct with resultant hemorrhagic transformation, secondary to small vessel disease. Resulted in right hemiparesis dysarthria and diplopia   //   readmission with aphasia July, 2016, this improved     Past Surgical History  Procedure Laterality Date  . Cataract extraction Bilateral 2010  . Elbow surgery Right 1997  . Balloon angioplasty, artery  1992, 2004    CAD, Dr. KRon Parker . Colonoscopy  04/03/2002    adenomatous polyp, int hemorrhoids  . Colonoscopy  07/18/2007    normal (Dr. SFuller Plan  . Rotator cuff repair  09/2011    left, with subacromial decompression  . Nasal septum surgery    . Colonoscopy  09/26/2012    tubular adenoma, sm int hem, rpt 5 yrs (Fuller Plan  . Tonsillectomy and adenoidectomy    . Eye lids raised    . Cardiac catheterization  2004  . Polypectomy N/A 05/27/2013    Procedure: ENDOSCOPIC NASOPHARYNGEAL MASS;  Surgeon: DJerrell Belfast MD    There were no vitals filed for this visit.  Visit Diagnosis:  Right hemiparesis ([place]  Decreased coordination  Stiffness of right shoulder joint      Subjective Assessment - 03/10/15 1455    Subjective  Everything is good   Currently in Pain? No/denies  OT Treatments/Exercises (OP) - 03/10/15 0001    ADLs   Home Maintenance Folding pillow cases with mod cueing for compensation patterns, but pt able to complete using BUEs with incr time (partially corrected compensation with cueing).  Wiping tabletop with RUE with min-mod cueing for compensation (and partial correction).  Verbal and tactile cues given.   ADL Comments Began checking goals (see goals section) and discussing progress and indications for d/c from OT based on pt questions (however, plan to renew after this week).  Also discussed indications for return to occupational therapy after discharge if needed.    Neurological Re-education Exercises   Shoulder Flexion AAROM;Supine;Both  with PVC frame   Elbow Extension AAROM;Both;Supine  with PVC frame, AROM RUE with min facilitation for control   Other Grasp and Release Exercises  In sitting, low range functional reaching to grasp/release cylinder objects with min facilitation for control intermittently. Pt with min-mod cues for compensation patterns/shoulder hike.   Other Grasp and Release Exercises  In sitting, reaching to grasp medium object with BUEs for controlled movements with min cueing for compensation.   Other Weight-Bearing Exercises 1 wt. bearing on elbow in sitting with tricep ext/push to sit for incr shoulder stability, neuro re-ed/control.                OT Education - 03/10/15 1551    Education Details Avoid shoulder hike with functional activities (folding clothes, wiping table, etc)   Person(s) Educated Patient;Spouse   Methods Explanation   Comprehension Verbalized understanding          OT Short Term Goals - 03/10/15 1457    OT SHORT TERM GOAL #1   Title Pt will be independent with initial HEP.--check STGs 02/10/15   Time 4   Period Weeks   Status Achieved  03/10/15   OT SHORT TERM GOAL #2   Title Pt will use RUE as gross A/stabilizer at least 25% of the time for ADLs.   Time 4   Period Weeks   Status Achieved  02/23/15     OT SHORT TERM GOAL #3   Title Pt will demo at least 55* R shoulder flex with min compensation in prep for functional reach.   Baseline 45*   Time 4   Period Weeks   Status On-going  03/10/15:  50* with min-mod compensation/cueing   OT SHORT TERM GOAL #4   Title Pt will be able to complete clothing fasteners (buttons, tying shoes) mod I using AE prn.   Time 4   Period Weeks   Status Achieved  02/23/15           OT Long Term Goals - 03/10/15 1603    OT LONG TERM GOAL #1   Title Pt will be independent with updated HEP.--check LTGs 03/13/15   Time 8   Period Weeks   Status On-going    OT LONG TERM GOAL #2   Title Pt will use RUE as gross A/stabilizer for ADLs at least 50% of the time.   Time 8   Period Weeks   Status Achieved  03/10/15   OT LONG TERM GOAL #3   Title Pt will demo at least 65* R shoulder flex with min compensation in prep for functional reach.   Baseline 45*   Time 8   Period Weeks   Status Not Met  03/10/15:  50* with min-mod compensation/cueing   OT LONG TERM GOAL #4   Title Pt will improve RUE functional reaching/coordination for ADLs  as shown by improving score on box and blocks test to at least 15 blocks.   Baseline 9 blocks   Time 8   Period Weeks   Status On-going   OT LONG TERM GOAL #5   Title Pt will perform bathing with supervision.   Time 8   Period Weeks   Status Achieved   OT LONG TERM GOAL #6   Title Pt will verbalize understanding of cognitive/visual compensation strategies and activities to improve cognition/vision prn.   Time 8   Period Weeks   Status On-going               Plan - 03/10/15 1556    Clinical Impression Statement Pt progressing slowly with incr RUE functional use (as stabilizer) and incr control with less facilitation for low-range functional reach.   Plan check remaining goals with plan to renew; neuro re-ed, RUE functional use   OT Home Exercise Plan Education provided:  initial HEP issued; 03/03/15 memory compensations, cognitive/visual HEP, coordination HEP issued   Consulted and Agree with Plan of Care Patient;Family member/caregiver   Family Member Consulted wife, dtr        Problem List Patient Active Problem List   Diagnosis Date Noted  . Anterior cerebral circulation hemorrhagic infarction ([place]) 11/24/2014  . Hemiparesis affecting right side as late effect of cerebrovascular accident ([place]) 09/05/2014  . Depression due to stroke ([place]) 09/05/2014  . Thrombocytopenia ([place]) 06/05/2014  . Chronic diastolic heart failure, NYHA class 1 ([place]) 06/05/2014  . Ex-smoker 06/03/2014  . Benign  paroxysmal positional vertigo 04/28/2014  . Right carotid bruit   . Polyneuropathy, diabetic ([place]) 08/05/2012  . Seasonal allergic rhinitis   . Trigger thumb of left hand 10/12/2011  . Gout   . Adenomatous polyps   . CAD (coronary artery disease)   . Essential hypertension   . Hyperlipidemia   . Type 2 diabetes, uncontrolled, with neuropathy ([place])   . Ejection fraction     [facility] 03/10/2015, 4:05 PM  [facility] [address] [place], [place], [address] Phone: [phone]   Fax:  [phone]  Name: Jeffrey [name] MRN: [id number] Date of Birth: 1940/11/07  Vianne Bulls, OTR/L 03/10/2015 4:06 PM

## 2015-03-12 ENCOUNTER — Encounter: Payer: Self-pay | Admitting: Rehabilitation

## 2015-03-12 ENCOUNTER — Ambulatory Visit: Payer: PPO | Admitting: Occupational Therapy

## 2015-03-12 ENCOUNTER — Ambulatory Visit: Payer: PPO | Admitting: Rehabilitation

## 2015-03-12 DIAGNOSIS — R269 Unspecified abnormalities of gait and mobility: Secondary | ICD-10-CM | POA: Diagnosis not present

## 2015-03-12 DIAGNOSIS — R2681 Unsteadiness on feet: Secondary | ICD-10-CM

## 2015-03-12 DIAGNOSIS — H539 Unspecified visual disturbance: Secondary | ICD-10-CM

## 2015-03-12 DIAGNOSIS — R279 Unspecified lack of coordination: Secondary | ICD-10-CM

## 2015-03-12 DIAGNOSIS — G8191 Hemiplegia, unspecified affecting right dominant side: Secondary | ICD-10-CM

## 2015-03-12 DIAGNOSIS — R270 Ataxia, unspecified: Secondary | ICD-10-CM

## 2015-03-12 DIAGNOSIS — R2689 Other abnormalities of gait and mobility: Secondary | ICD-10-CM

## 2015-03-12 DIAGNOSIS — M25611 Stiffness of right shoulder, not elsewhere classified: Secondary | ICD-10-CM

## 2015-03-12 DIAGNOSIS — R278 Other lack of coordination: Secondary | ICD-10-CM

## 2015-03-12 DIAGNOSIS — I69319 Unspecified symptoms and signs involving cognitive functions following cerebral infarction: Secondary | ICD-10-CM

## 2015-03-12 DIAGNOSIS — M6281 Muscle weakness (generalized): Secondary | ICD-10-CM

## 2015-03-12 DIAGNOSIS — I69351 Hemiplegia and hemiparesis following cerebral infarction affecting right dominant side: Secondary | ICD-10-CM

## 2015-03-12 NOTE — Therapy (Signed)
[facility] [address] [place], [place], [address] Phone: [phone]   Fax:  [phone]  Physical Therapy Treatment  Patient Details  Name: Jeffrey [name] MRN: [id number] Date of Birth: 1940/07/23 Referring Provider: Leonie Man  Encounter Date: 03/12/2015      PT End of Session - 03/12/15 1410    Visit Number 17   Number of Visits 25   Date for PT Re-Evaluation 04/09/15   Authorization Type Blue Medicare HMO-G- code every 10th visit   PT Start Time 1402   PT Stop Time 1445   PT Time Calculation (min) 43 min   Equipment Utilized During Treatment Gait belt   Activity Tolerance Patient tolerated treatment well   Behavior During Therapy [facility] for tasks assessed/performed      Past Medical History  Diagnosis Date  . CAD (coronary artery disease)     PCI distal RCA ...2004, residual 70% LAD   /   ...nuclear...03/2007...no ischemia.Marland KitchenMarland Kitchenpreserved LV /  nuclear...03/03/2009...inferior scar..no ischemia..EF 51%  . Dyslipidemia     takes Atorvastatin daily  . Internal hemorrhoids   . Gout     takes Allopurinol daily and Colchicine as needed  . Seasonal allergic rhinitis   . Right carotid bruit   . Cyst of nasopharynx     per ENT Wilburn Cornelia  . Myocardial infarction [facility]) 36yr ago  . Peripheral edema     takes Lasix daily  . HTN (hypertension)     takes Cardura,Metoprolol,[name],and Amlodipine daily  . Arthritis   . GERD (gastroesophageal reflux disease)     takes Omeprazole daily  . History of colon polyps   . Type 2 diabetes, uncontrolled, with neuropathy ([place] 1989    takes Invokana daily and has an insulin pump ([name]  . 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 HCAP (healthcare-associated pneumonia)   . Hemiparesis affecting right side as late effect of cerebrovascular accident ([place] 09/05/2014  . Anterior cerebral circulation hemorrhagic infarction ([facility]  11/24/2014    March, 2016, dominant left thalamic and left internal capsule ischemic infarct with resultant hemorrhagic transformation, secondary to small vessel disease. Resulted in right hemiparesis dysarthria and diplopia   //   readmission with aphasia July, 2016, this improved     Past Surgical History  Procedure Laterality Date  . Cataract extraction Bilateral 2010  . Elbow surgery Right 1997  . Balloon angioplasty, artery  1992, 2004    CAD, Dr. KRon Parker . Colonoscopy  04/03/2002    adenomatous polyp, int hemorrhoids  . Colonoscopy  07/18/2007    normal (Dr. SFuller Plan  . Rotator cuff repair  09/2011    left, with subacromial decompression  . Nasal septum surgery    . Colonoscopy  09/26/2012    tubular adenoma, sm int hem, rpt 5 yrs (Fuller Plan  . Tonsillectomy and adenoidectomy    . Eye lids raised    . Cardiac catheterization  2004  . Polypectomy N/A 05/27/2013    Procedure: ENDOSCOPIC NASOPHARYNGEAL MASS;  Surgeon: DJerrell Belfast MD    There were no vitals filed for this visit.  Visit Diagnosis:  Abnormality of gait  Unsteadiness  Decreased functional mobility  Decreased coordination  Muscle weakness of lower extremity  Hemiparesis affecting right side as late effect of cerebrovascular accident ([facility]      Subjective Assessment - 03/12/15 1410    Subjective Reports no changes since last visit.  No falls.    Pertinent  History Blood glucose may drop when pt has 2 therapies in a row. Pt's family packs bag of snacks to eat if pt feels hypoglycemic.   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   Currently in Pain? No/denies           TA:  Session focused on problem solving pt being able to get up at night and use walker to safely get to restroom without use of brace.  Assessed pts ability to get to EOB, place  shoes on feet (without AFO) and stand with RW.  Pt able to ambulate to/from restroom, simulate managing clothing with LUE and ambulate back to "bed" safely without evidence of instability in R ankle.  Feel that he is safe to now do this at home and recommend he do this (also notified wife of this.)  Then worked on problem solving removing use of w/c ALL together in the house.  Pt states that he uses w/c in the morning to "sneak" out of room and not disturb wife and goes into kitchen, makes coffee and carries it (in w/c) to his office where computer is.  Wife verbalized that she hears him every morning and that she would love for him to begin walking.  Looked up several options for walker bags and educated pt on getting portable mug (with lid) that can slip into walker bag pouch so that he can ambulate to/from study with RW.  Pt and wife verbalized understanding.    NMR:  Also addressed gait with use of SPC while carrying object to progress pt to being able to do this to carry objects from kitchen to study, and throughout the house.  He is able to ambulate with SPC at min/guard level but requires constant tactile cues for attending R hand during task.  Performed 41' with cues for sequencing with SPC as well as attention to R hand.  Overall demonstrates improvement in ability to multi-task but do note that when distracted or making more difficult turns (to sit) he tends to lose attention to R hand, causing cup to tip over (he did not drop cup).  Attempted to have pt ambulate with weighted coffee mug (could not find lid) to simulate walking from bedroom to kitchen to retrieve cup and then ambulate back to study.  Again, only requires min/guard to maintain balance, but constant verbal and tactile cues for attending RUE to avoid dropping cup.                        PT Education - 03/12/15 1410    Education provided Yes   Education Details walking to/from restroom at night with RW and without  brace okay to do at home, getting rid of w/c in house all together for increased independence.    Person(s) Educated Patient   Methods Explanation   Comprehension Verbalized understanding          PT Short Term Goals - 02/03/15 1324    PT SHORT TERM GOAL #1   Title Pt will perform HEP with family assistance/supervision, for lower extremitiy strengthening.  TARGET 02/06/15   Time 4   Period Weeks   Status Achieved   PT SHORT TERM GOAL #2   Title Pt will perform sit<>stand transfers with supervision, for improved efficiency and safety with  transfers.   Baseline Met 11/22.   Time 4   Period Weeks   Status Achieved   PT SHORT TERM GOAL #3   Title Pt will improve TUG score to less than or equal to 70 seconds for decreased fall risk.   Baseline 52.62 seconds with R AFO and heel wedge;73 seconds without AFO   Time 4   Period Weeks   Status Achieved   PT SHORT TERM GOAL #4   Title Pt will ambulate at least 150 ft using least restrictive assistive device with minimal assistance for improved gait efficiency.   Baseline Met 11/22.   Time 4   Period Weeks   Status Achieved   PT SHORT TERM GOAL #5   Title Pt will verbalize understanding of CVA education.   Time 4   Period Weeks   Status Achieved           PT Long Term Goals - 03/10/15 1413    PT LONG TERM GOAL #1   Title Pt will verbalize understanding of fall prevention within home environment.  Modified TARGET 04/07/15   Baseline Requires mod cues to recall on 03/10/15   Time 4   Period Weeks   Status On-going   PT LONG TERM GOAL #2   Title Pt will improve Timed Up and GO score to < or equal to 24.95 seconds for decreased fall risk.   Baseline 12/29: TUG = 27.95 sec   Time 4   Period Weeks   Status Revised   PT LONG TERM GOAL #3   Title Pt will improve gait velocity to 1.94 ft/sec for improved gait efficiency and safety.   Baseline 12/29: gait velocity = 1.34 ft/sec   Time 4   Period Weeks   Status Revised   PT LONG  TERM GOAL #4   Title Pt will improve BERG balance score to 38/56 in order to indicate decreased fall risk.    Baseline 21/56 (01/12/15) to 34/56 on 03/10/15   Time 4   Period Weeks   Status Revised   PT LONG TERM GOAL #5   Title Pt will ambulate >300' using least restrictive assistive device at mod I level for improved efficiency and safety with gait.   Time 4   Period --   Status Revised   PT LONG TERM GOAL #6   Title Pt will verbalize plans for continued community fitness upon D/C from PT.   Time 4   Period Weeks   Status On-going               Plan - 03/12/15 1410    Clinical Impression Statement Skilled session focused on addressing concerns for mobilty at home.  Pt now safe to ambulate to/from restroom with RW at night without donning AFO at mod I level.  Pt given options for using RW bag in order to carry coffee from kitchen to study.  also working on ambulating with SPC while carrying object to work towards pts goals.     Pt will benefit from skilled therapeutic intervention in order to improve on the following deficits Abnormal gait;Decreased balance;Decreased mobility;Decreased coordination;Decreased strength;Difficulty walking;Impaired sensation;Impaired tone   Rehab Potential Good   PT Frequency 2x / week   PT Duration 4 weeks   PT Treatment/Interventions ADLs/Self Care Home Management;Therapeutic exercise;Therapeutic activities;Functional mobility training;Gait training;Balance training;Neuromuscular re-education;Electrical Stimulation   PT Next Visit Plan  Consider practicing entering/exiting pulpit when daughter present. Continue to address RLE WB, NMR and coordination, gait w/ SPC  Consulted and Agree with Plan of Care Patient   Family Member Consulted wife        Problem List Patient Active Problem List   Diagnosis Date Noted  . Anterior cerebral circulation hemorrhagic infarction ([place]) 11/24/2014  . Hemiparesis affecting right side as late effect of  cerebrovascular accident ([place]) 09/05/2014  . Depression due to stroke ([place]) 09/05/2014  . Thrombocytopenia ([place]) 06/05/2014  . Chronic diastolic heart failure, NYHA class 1 ([place]) 06/05/2014  . Ex-smoker 06/03/2014  . Benign paroxysmal positional vertigo 04/28/2014  . Right carotid bruit   . Polyneuropathy, diabetic ([place]) 08/05/2012  . Seasonal allergic rhinitis   . Trigger thumb of left hand 10/12/2011  . Gout   . Adenomatous polyps   . CAD (coronary artery disease)   . Essential hypertension   . Hyperlipidemia   . Type 2 diabetes, uncontrolled, with neuropathy ([place])   . Ejection fraction    Cameron Sprang, PT, MPT [facility] [address] [place], [place], [address] Phone: [phone]   Fax:  [phone] 03/12/2015, 3:06 PM  Name: Jeffrey [name] MRN: [id number] Date of Birth: 03/14/1940

## 2015-03-12 NOTE — Therapy (Signed)
[facility] [address] [place], [place], [address] Phone: [phone]   Fax:  [phone]  Occupational Therapy Treatment  Patient Details  Name: Jeffrey [name] MRN: [id number] Date of Birth: 1941/03/05 Referring Provider: Dr. Leonie Man  Encounter Date: 03/12/2015      OT End of Session - 03/12/15 1613    Visit Number 16   Number of Visits 32  16+16=32   Date for OT Re-Evaluation 05/09/15   Authorization Type [employer] HMO, no visit limit/no auth, G-code   Authorization Time Period renewal completed 3/0/09, cert period 04/09/28-0/7/62, next wk will be 1/8   Authorization - Visit Number 78   Authorization - Number of Visits 20   OT Start Time 1450   OT Stop Time 1535   OT Time Calculation (min) 45 min   Activity Tolerance Patient tolerated treatment well   Behavior During Therapy [facility] for tasks assessed/performed      Past Medical History  Diagnosis Date  . CAD (coronary artery disease)     PCI distal RCA ...2004, residual 70% LAD   /   ...nuclear...03/2007...no ischemia.Marland KitchenMarland Kitchenpreserved LV /  nuclear...03/03/2009...inferior scar..no ischemia..EF 51%  . Dyslipidemia     takes Atorvastatin daily  . Internal hemorrhoids   . Gout     takes Allopurinol daily and Colchicine as needed  . Seasonal allergic rhinitis   . Right carotid bruit   . Cyst of nasopharynx     per ENT Wilburn Cornelia  . Myocardial infarction [facility]) 82yr ago  . Peripheral edema     takes Lasix daily  . HTN (hypertension)     takes Cardura,Metoprolol,[name],and Amlodipine daily  . Arthritis   . GERD (gastroesophageal reflux disease)     takes Omeprazole daily  . History of colon polyps   . Type 2 diabetes, uncontrolled, with neuropathy ([place] 1989    takes Invokana daily and has an insulin pump ([name]  . 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 HCAP (healthcare-associated pneumonia)   .  Hemiparesis affecting right side as late effect of [place]vascular accident ([place] 09/[place]  . Anterior cerebral circulation hemorrhagic infarction ([facility] 11/24/2014    March, 2016, dominant left thalamic and left internal capsule ischemic infarct with resultant hemorrhagic transformation, secondary to small vessel disease. Resulted in right hemiparesis dysarthria and diplopia   //   readmission with aphasia July, 2016, this improved     Past Surgical History  Procedure Laterality Date  . Cataract extraction Bilateral 2010  . Elbow surgery Right 1997  . Balloon angioplasty, artery  1992, 2004    CAD, Dr. KRon Parker . Colonoscopy  04/03/2002    adenomatous polyp, int hemorrhoids  . Colonoscopy  07/18/2007    normal (Dr. SFuller Plan  . Rotator cuff repair  09/2011    left, with subacromial decompression  . Nasal septum surgery    . Colonoscopy  09/26/2012    tubular adenoma, sm int hem, rpt 5 yrs (Fuller Plan  . Tonsillectomy and adenoidectomy    . Eye lids raised    . Cardiac catheterization  2004  . Polypectomy N/A 05/27/2013    Procedure: ENDOSCOPIC NASOPHARYNGEAL MASS;  Surgeon: DJerrell Belfast MD    There were no vitals filed for this visit.  Visit Diagnosis:  Right hemiparesis (HCC)  Decreased coordination  Ataxia  Unsteadiness  Visual disturbance  Cognitive deficits following cerebral infarction  Stiffness of right shoulder joint  Decreased functional mobility  Subjective Assessment - 03/12/15 1622    Subjective  "I feel like I am getting stronger"   Patient Stated Goals utilization of right arm "I wish I had more control of it"   Currently in Pain? No/denies                      OT Treatments/Exercises (OP) - 03/12/15 0001    ADLs   ADL Comments Checked remaining goals and discussed progress for renewal.   Neurological Re-education Exercises   Shoulder Flexion AAROM;Both;Seated  with PVC frame and mod facilitation for control/compensation   Elbow  Extension AAROM;Right;Seated  with wt. bearing through tilted stool with min facilitation   Other Exercises 1 Controlled movements to place hand mat>lap, min cues for compensation/shoulder hike   Other Grasp and Release Exercises  In sitting, low range functional reaching to grasp/release cylinder objects by sliding along tabletop (forward and across body reaching) with min facilitation for control initially only. Pt with min cues for compensation patterns/shoulder hike.   Seated with weight on hand with arm on body movements for incr UE stability/control with min cues   Other Weight-Bearing Exercises 1 wt. bearing on elbow in sitting with tricep ext/push to sit for incr shoulder stability, neuro re-ed/control, min v.c.                  OT Short Term Goals - 03/12/15 1542    OT SHORT TERM GOAL #1   Title Pt will be independent with initial HEP.--check STGs 02/10/15   Time 4   Period Weeks   Status Achieved  03/10/15   OT SHORT TERM GOAL #2   Title Pt will use RUE as gross A/stabilizer at least 25% of the time for ADLs.   Time 4   Period Weeks   Status Achieved  02/23/15     OT SHORT TERM GOAL #3   Title Pt will demo at least 55* R shoulder flex with min compensation in prep for functional reach.   Baseline 45*   Time 4   Period Weeks   Status On-going  03/10/15:  50* with min-mod compensation/cueing   OT SHORT TERM GOAL #4   Title Pt will be able to complete clothing fasteners (buttons, tying shoes) mod I using AE prn.   Time 4   Period Weeks   Status Achieved  02/23/15   OT SHORT TERM GOAL #5   Title Pt will be able to grasp/release cylinder object independently using good control in 7/10 attempts without drops.--check 04/11/15   Time 4   Period Weeks   Status New   Additional Short Term Goals   Additional Short Term Goals Yes           OT Long Term Goals - 03/12/15 1527    OT LONG TERM GOAL #1   Title Pt will be independent with updated HEP.--check LTGs 3/417    Time 8   Period Weeks   Status On-going  03/12/15:  not fully met/ongoing--continue for renewal period   OT LONG TERM GOAL #2   Title Pt will use RUE as gross A/stabilizer for ADLs at least 50% of the time.   Time 8   Period Weeks   Status Achieved  03/10/15   OT LONG TERM GOAL #3   Title Pt will demo at least 60* R shoulder flex with min compensation in prep for functional reach.   Baseline 45*   Time 8   Period Weeks  Status Revised  03/10/15:  50* with min-mod compensation/cueing; revised 03/12/15   OT LONG TERM GOAL #4   Title Pt will improve RUE functional reaching/coordination for ADLs as shown by improving score on box and blocks test to at least 15 blocks.--continue for renewal, check 05/09/15   Baseline 9 blocks   Time 8   Period Weeks   Status On-going  03/12/15:  10 blocks   OT LONG TERM GOAL #5   Title Pt will perform bathing with supervision.   Time 8   Period Weeks   Status Achieved   Long Term Additional Goals   Additional Long Term Goals Yes   OT LONG TERM GOAL #6   Title Pt will verbalize understanding of cognitive/visual compensation strategies and activities to improve cognition/vision prn.   Time 8   Period Weeks   Status Achieved  03/12/15   OT LONG TERM GOAL #7   Title Pt will be able to grasp/release cylinder object independently using good control in 9/10 attempts without drops.--check 04/11/15   Baseline 8   Period Weeks   Status New   OT LONG TERM GOAL #8   Title Pt will be able to write name with RUE with at least 75% legiblity using AE prn.   Time 8   Period Weeks   Status New               Plan - 03/12/15 1618    Clinical Impression Statement Pt has made good progress with ADLs and using RUE as a stabilizer.  However, he is progressing slowly with control of RUE, and continues to need cues for compesation patterns due to severity of deficits (ataxia, sensory deficits).   Pt would benefit from continued occupational therapy to address incr RUE  functional use and control.     Pt will benefit from skilled therapeutic intervention in order to improve on the following deficits (Retired) Decreased cognition;Decreased mobility;Decreased strength;Impaired vision/preception;Impaired UE functional use;Decreased knowledge of use of DME;Decreased balance;Impaired tone;Impaired sensation;Decreased coordination;Decreased range of motion   Rehab Potential Good   Clinical Impairments Affecting Rehab Potential severity of deficits, sensory deficits, ataxia   OT Frequency 2x / week   OT Duration 8 weeks   OT Treatment/Interventions Self-care/ADL training;Therapeutic exercise;Functional Mobility Training;Patient/family education;Ultrasound;Neuromuscular education;Manual Therapy;Splinting;Therapeutic exercises;Cryotherapy;Parrafin;DME and/or AE instruction;Therapeutic activities;Cognitive remediation/compensation;Visual/perceptual remediation/compensation;Passive range of motion;Moist Heat;Contrast Bath;Fluidtherapy;Electrical Stimulation   Plan neuro re-ed, RUE functional use and control   OT Home Exercise Plan Education provided:  initial HEP issued; 03/03/15 memory compensations, cognitive/visual HEP, coordination HEP issued   Consulted and Agree with Plan of Care Patient        Problem List Patient Active Problem List   Diagnosis Date Noted  . Anterior cerebral circulation hemorrhagic infarction ([place]) 11/24/2014  . Hemiparesis affecting right side as late effect of cerebrovascular accident ([place]) 09/05/2014  . Depression due to stroke ([place]) 09/05/2014  . Thrombocytopenia ([place]) 06/05/2014  . Chronic diastolic heart failure, NYHA class 1 ([place]) 06/05/2014  . Ex-smoker 06/03/2014  . Benign paroxysmal positional vertigo 04/28/2014  . Right carotid bruit   . Polyneuropathy, diabetic ([place]) 08/05/2012  . Seasonal allergic rhinitis   . Trigger thumb of left hand 10/12/2011  . Gout   . Adenomatous polyps   . CAD (coronary artery disease)   .  Essential hypertension   . Hyperlipidemia   . Type 2 diabetes, uncontrolled, with neuropathy ([place])   . Ejection fraction     [facility] 03/12/2015, 4:30 PM  [facility]  [facility] [address] [place], [place], [address] Phone: [phone]   Fax:  [phone]  Name: Jeffrey [name] MRN: [id number] Date of Birth: [birth year]  Vianne Bulls, OTR/L 03/12/2015 4:30 PM

## 2015-03-16 ENCOUNTER — Ambulatory Visit: Payer: PPO | Admitting: Occupational Therapy

## 2015-03-16 ENCOUNTER — Ambulatory Visit: Payer: PPO | Admitting: Rehabilitation

## 2015-03-18 ENCOUNTER — Ambulatory Visit: Payer: PPO | Admitting: Physical Therapy

## 2015-03-18 ENCOUNTER — Encounter: Payer: Self-pay | Admitting: Occupational Therapy

## 2015-03-18 ENCOUNTER — Ambulatory Visit: Payer: PPO | Admitting: Occupational Therapy

## 2015-03-18 DIAGNOSIS — R2681 Unsteadiness on feet: Secondary | ICD-10-CM

## 2015-03-18 DIAGNOSIS — R269 Unspecified abnormalities of gait and mobility: Secondary | ICD-10-CM | POA: Diagnosis not present

## 2015-03-18 DIAGNOSIS — R279 Unspecified lack of coordination: Secondary | ICD-10-CM

## 2015-03-18 DIAGNOSIS — R270 Ataxia, unspecified: Secondary | ICD-10-CM

## 2015-03-18 DIAGNOSIS — I69351 Hemiplegia and hemiparesis following cerebral infarction affecting right dominant side: Secondary | ICD-10-CM

## 2015-03-18 DIAGNOSIS — G8191 Hemiplegia, unspecified affecting right dominant side: Secondary | ICD-10-CM

## 2015-03-18 DIAGNOSIS — R278 Other lack of coordination: Secondary | ICD-10-CM

## 2015-03-18 DIAGNOSIS — M25611 Stiffness of right shoulder, not elsewhere classified: Secondary | ICD-10-CM

## 2015-03-18 NOTE — Therapy (Signed)
[facility] [address] [place], [place], [address] Phone: [phone]   Fax:  [phone]  Occupational Therapy Treatment  Patient Details  Name: Jeffrey [name] MRN: [id number] Date of Birth: December 05, 1940 Referring Provider: Dr. Leonie Man  Encounter Date: 03/18/2015      OT End of Session - 03/18/15 1749    Visit Number 17   Number of Visits 32   Date for OT Re-Evaluation 05/09/15   Authorization Type Blue [employer] HMO, no visit limit/no auth, G-code   Authorization Time Period renewal completed 05/06/65, cert period 03/09/43-8/0/99, next wk will be 1/8   Authorization - Visit Number 65   Authorization - Number of Visits 20   OT Start Time 1535   OT Stop Time 1622   OT Time Calculation (min) 47 min   Activity Tolerance Patient tolerated treatment well   Behavior During Therapy [facility] for tasks assessed/performed      Past Medical History  Diagnosis Date  . CAD (coronary artery disease)     PCI distal RCA ...2004, residual 70% LAD   /   ...nuclear...03/2007...no ischemia.Marland KitchenMarland Kitchenpreserved LV /  nuclear...03/03/2009...inferior scar..no ischemia..EF 51%  . Dyslipidemia     takes Atorvastatin daily  . Internal hemorrhoids   . Gout     takes Allopurinol daily and Colchicine as needed  . Seasonal allergic rhinitis   . Right carotid bruit   . Cyst of nasopharynx     per ENT Wilburn Cornelia  . Myocardial infarction [facility]) 23yr ago  . Peripheral edema     takes Lasix daily  . HTN (hypertension)     takes Cardura,Metoprolol,[name],and Amlodipine daily  . Arthritis   . GERD (gastroesophageal reflux disease)     takes Omeprazole daily  . History of colon polyps   . Type 2 diabetes, uncontrolled, with neuropathy ([place] 1989    takes Invokana daily and has an insulin pump ([name]  . 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 HCAP (healthcare-associated pneumonia)   . Hemiparesis  affecting right side as late effect of cerebrovascular accident ([place] 09/05/2014  . Anterior cerebral circulation hemorrhagic infarction ([facility] 11/24/2014    March, 2016, dominant left thalamic and left internal capsule ischemic infarct with resultant hemorrhagic transformation, secondary to small vessel disease. Resulted in right hemiparesis dysarthria and diplopia   //   readmission with aphasia July, 2016, this improved     Past Surgical History  Procedure Laterality Date  . Cataract extraction Bilateral 2010  . Elbow surgery Right 1997  . Balloon angioplasty, artery  1992, 2004    CAD, Dr. KRon Parker . Colonoscopy  04/03/2002    adenomatous polyp, int hemorrhoids  . Colonoscopy  07/18/2007    normal (Dr. SFuller Plan  . Rotator cuff repair  09/2011    left, with subacromial decompression  . Nasal septum surgery    . Colonoscopy  09/26/2012    tubular adenoma, sm int hem, rpt 5 yrs (Fuller Plan  . Tonsillectomy and adenoidectomy    . Eye lids raised    . Cardiac catheterization  2004  . Polypectomy N/A 05/27/2013    Procedure: ENDOSCOPIC NASOPHARYNGEAL MASS;  Surgeon: DJerrell Belfast MD    There were no vitals filed for this visit.  Visit Diagnosis:  Ataxia  Decreased coordination  Stiffness of right shoulder joint  Right hemiparesis ([place]      Subjective Assessment - 03/18/15 1743    Subjective  I really don't  feel much of anything (in right arm)                      OT Treatments/Exercises (OP) - 03/18/15 1744    ADLs   Functional Mobility Modified hand grip on walker to prevent slipping.     Neurological Re-education Exercises   Other Exercises 1 In sitting, worked on use of vision to aide with motor control and grading of motion in right forearm, wrist, and hand.  Patient did best while working off surface, tabletop, with clearly defined movement expectations.  Patient able to modulate force through hand when picking up 1 inch blocks when controlling one body part at a  time.  Patient improved performance with rote repetition.   Other Exercises 2 In sitting worked on modified PNF diagonals D1/D2 while holding a Merchandiser, retail ball.  Patient able to use left arm to guide motion with right arm, with frequent cueing to control tension in shoulder, and reduce trunk substitutions.                  OT Education - 03/18/15 1749    Education provided Yes   Person(s) Educated Patient   Methods Explanation;Demonstration;Verbal cues   Comprehension Need further instruction;Verbal cues required          OT Short Term Goals - 03/12/15 1542    OT SHORT TERM GOAL #1   Title Pt will be independent with initial HEP.--check STGs 02/10/15   Time 4   Period Weeks   Status Achieved  03/10/15   OT SHORT TERM GOAL #2   Title Pt will use RUE as gross A/stabilizer at least 25% of the time for ADLs.   Time 4   Period Weeks   Status Achieved  02/23/15     OT SHORT TERM GOAL #3   Title Pt will demo at least 55* R shoulder flex with min compensation in prep for functional reach.   Baseline 45*   Time 4   Period Weeks   Status On-going  03/10/15:  50* with min-mod compensation/cueing   OT SHORT TERM GOAL #4   Title Pt will be able to complete clothing fasteners (buttons, tying shoes) mod I using AE prn.   Time 4   Period Weeks   Status Achieved  02/23/15   OT SHORT TERM GOAL #5   Title Pt will be able to grasp/release cylinder object independently using good control in 7/10 attempts without drops.--check 04/11/15   Time 4   Period Weeks   Status New   Additional Short Term Goals   Additional Short Term Goals Yes           OT Long Term Goals - 03/12/15 1527    OT LONG TERM GOAL #1   Title Pt will be independent with updated HEP.--check LTGs 3/417   Time 8   Period Weeks   Status On-going  03/12/15:  not fully met/ongoing--continue for renewal period   OT LONG TERM GOAL #2   Title Pt will use RUE as gross A/stabilizer for ADLs at least 50% of the  time.   Time 8   Period Weeks   Status Achieved  03/10/15   OT LONG TERM GOAL #3   Title Pt will demo at least 60* R shoulder flex with min compensation in prep for functional reach.   Baseline 45*   Time 8   Period Weeks   Status Revised  03/10/15:  50* with min-mod compensation/cueing;  revised 03/12/15   OT LONG TERM GOAL #4   Title Pt will improve RUE functional reaching/coordination for ADLs as shown by improving score on box and blocks test to at least 15 blocks.--continue for renewal, check 05/09/15   Baseline 9 blocks   Time 8   Period Weeks   Status On-going  03/12/15:  10 blocks   OT LONG TERM GOAL #5   Title Pt will perform bathing with supervision.   Time 8   Period Weeks   Status Achieved   Long Term Additional Goals   Additional Long Term Goals Yes   OT LONG TERM GOAL #6   Title Pt will verbalize understanding of cognitive/visual compensation strategies and activities to improve cognition/vision prn.   Time 8   Period Weeks   Status Achieved  03/12/15   OT LONG TERM GOAL #7   Title Pt will be able to grasp/release cylinder object independently using good control in 9/10 attempts without drops.--check 04/11/15   Baseline 8   Period Weeks   Status New   OT LONG TERM GOAL #8   Title Pt will be able to write name with RUE with at least 75% legiblity using AE prn.   Time 8   Period Weeks   Status New               Plan - 03/18/15 1750    Clinical Impression Statement Patient with significant impairments which limit functional use of right UE, therefore anticipate longer course of rehab for appreciable functional improvement   Pt will benefit from skilled therapeutic intervention in order to improve on the following deficits (Retired) Decreased cognition;Decreased mobility;Decreased strength;Impaired vision/preception;Impaired UE functional use;Decreased knowledge of use of DME;Decreased balance;Impaired tone;Impaired sensation;Decreased coordination;Decreased range of  motion   Rehab Potential Good   Clinical Impairments Affecting Rehab Potential severity of deficits, sensory deficits, ataxia   OT Frequency 2x / week   OT Duration 8 weeks   OT Treatment/Interventions Self-care/ADL training;Therapeutic exercise;Functional Mobility Training;Patient/family education;Ultrasound;Neuromuscular education;Manual Therapy;Splinting;Therapeutic exercises;Cryotherapy;Parrafin;DME and/or AE instruction;Therapeutic activities;Cognitive remediation/compensation;Visual/perceptual remediation/compensation;Passive range of motion;Moist Heat;Contrast Bath;Fluidtherapy;Electrical Stimulation   Plan neuro reed right UE, functional use of right UE   Consulted and Agree with Plan of Care Patient        Problem List Patient Active Problem List   Diagnosis Date Noted  . Anterior cerebral circulation hemorrhagic infarction ([place]) 11/24/2014  . Hemiparesis affecting right side as late effect of cerebrovascular accident ([place]) 09/05/2014  . Depression due to stroke ([place]) 09/05/2014  . Thrombocytopenia ([place]) 06/05/2014  . Chronic diastolic heart failure, NYHA class 1 ([place]) 06/05/2014  . Ex-smoker 06/03/2014  . Benign paroxysmal positional vertigo 04/28/2014  . Right carotid bruit   . Polyneuropathy, diabetic ([place]) 08/05/2012  . Seasonal allergic rhinitis   . Trigger thumb of left hand 10/12/2011  . Gout   . Adenomatous polyps   . CAD (coronary artery disease)   . Essential hypertension   . Hyperlipidemia   . Type 2 diabetes, uncontrolled, with neuropathy ([place])   . Ejection fraction     Mariah Milling , OTR/L  03/18/2015, 5:52 PM  [facility] [address] [place], [place], [address] Phone: [phone]   Fax:  [phone]  Name: Jeffrey [name] MRN: [id number] Date of Birth: 1940-04-08

## 2015-03-18 NOTE — Therapy (Signed)
[facility] [address] [place], [place], [address] Phone: [phone]   Fax:  [phone]  Physical Therapy Treatment  Patient Details  Name: Jeffrey [name] MRN: [id number] Date of Birth: 1940/09/21 Referring Provider: Leonie Man  Encounter Date: 03/18/2015      PT End of Session - 03/18/15 1716    Visit Number 18   Number of Visits 25   Date for PT Re-Evaluation 04/09/15   Authorization Type Blue [employer] HMO-G- code every 10th visit   PT Start Time 1450   PT Stop Time 1530   PT Time Calculation (min) 40 min   Equipment Utilized During Treatment Gait belt   Activity Tolerance Patient tolerated treatment well   Behavior During Therapy [facility] for tasks assessed/performed      Past Medical History  Diagnosis Date  . CAD (coronary artery disease)     PCI distal RCA ...2004, residual 70% LAD   /   ...nuclear...03/2007...no ischemia.Marland KitchenMarland Kitchenpreserved LV /  nuclear...03/03/2009...inferior scar..no ischemia..EF 51%  . Dyslipidemia     takes Atorvastatin daily  . Internal hemorrhoids   . Gout     takes Allopurinol daily and Colchicine as needed  . Seasonal allergic rhinitis   . Right carotid bruit   . Cyst of nasopharynx     per ENT Wilburn Cornelia  . Myocardial infarction [facility]) 38yr ago  . Peripheral edema     takes Lasix daily  . HTN (hypertension)     takes Cardura,Metoprolol,[name],and Amlodipine daily  . Arthritis   . GERD (gastroesophageal reflux disease)     takes Omeprazole daily  . History of colon polyps   . Type 2 diabetes, uncontrolled, with neuropathy ([place] 1989    takes Invokana daily and has an insulin pump ([name]  . 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 HCAP (healthcare-associated pneumonia)   . Hemiparesis affecting right side as late effect of cerebrovascular accident ([place] 09/05/2014  . Anterior cerebral circulation hemorrhagic infarction ([facility]  11/24/2014    March, 2016, dominant left thalamic and left internal capsule ischemic infarct with resultant hemorrhagic transformation, secondary to small vessel disease. Resulted in right hemiparesis dysarthria and diplopia   //   readmission with aphasia July, 2016, this improved     Past Surgical History  Procedure Laterality Date  . Cataract extraction Bilateral 2010  . Elbow surgery Right 1997  . Balloon angioplasty, artery  1992, 2004    CAD, Dr. KRon Parker . Colonoscopy  04/03/2002    adenomatous polyp, int hemorrhoids  . Colonoscopy  07/18/2007    normal (Dr. SFuller Plan  . Rotator cuff repair  09/2011    left, with subacromial decompression  . Nasal septum surgery    . Colonoscopy  09/26/2012    tubular adenoma, sm int hem, rpt 5 yrs (Fuller Plan  . Tonsillectomy and adenoidectomy    . Eye lids raised    . Cardiac catheterization  2004  . Polypectomy N/A 05/27/2013    Procedure: ENDOSCOPIC NASOPHARYNGEAL MASS;  Surgeon: DJerrell Belfast MD    There were no vitals filed for this visit.  Visit Diagnosis:  Hemiparesis affecting right side as late effect of cerebrovascular accident ([facility]  Abnormality of gait  Unsteadiness      Subjective Assessment - 03/18/15 1648    Subjective Reports no changes since last visit.  No falls. Pt's wife and daughter inquiring as to why patient's AFO not covered by insurance.   Patient  is accompained by: Family member   Pertinent History Blood glucose may drop when pt has 2 therapies in a row. Pt's family packs bag of snacks to eat if pt feels hypoglycemic.   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   Currently in Pain? No/denies                         [facility] Adult PT Treatment/Exercise - 03/18/15 0001    Transfers   Transfers Sit to Stand;Stand to Sit   Sit to Stand 6:  Modified independent (Device/Increase time)   Stand to Sit 6: Modified independent (Device/Increase time)   Floor to Transfer 4: Min assist   Floor to Transfer Details (indicate cue type and reason) with min A for joint protection in RUE/RLE; 50% cueing for technique, sequencing, and safe RUE/RLE positioning. Pt able to effectively verbalize when appropriate to use life alert in case of fall.   Ambulation/Gait   Ambulation/Gait Yes   Ambulation/Gait Assistance 5: Supervision;4: Min guard;4: Min assist   Ambulation/Gait Assistance Details x115' with RW with R h/o; x230' with [facility]    Ambulation Distance (Feet) 345 Feet   Assistive device Rolling walker;Other (Comment);Straight cane  R AFO; RW with R hand orthosis; SPC with quad attachment   Gait Pattern Step-through pattern;Decreased stance time - right;Decreased step length - left;Decreased weight shift to right;Ataxic;Abducted- right  R recurvatum only significant when ascending ramp   Ambulation Surface Level;Indoor   Gait velocity --   Stairs --   Stairs Assistance --   Stair Management Technique --   Number of Stairs --   Height of Stairs --   Ramp --   Curb --   Gait Comments When gait training with SPC, attempted to utilize [facility] in R hand to increase proprioceptive input/sensory awareness; however, pt with difficulty motor planning/sequencing gait. Performed final 120' of gait trial with  SPC in L hand, ski pole in R hand in attempt to promote reciprocal arm swing with some improvement noted but minimal carryover without SPC.   Berg Balance Test   Sit to Stand --   Standing Unsupported --   Sitting with Back Unsupported but Feet Supported on Floor or Stool --   Stand to Sit --   Transfers --   Standing Unsupported with Eyes Closed --   Standing Ubsupported with Feet Together --   From Standing, Reach Forward with Outstretched Arm --   From Standing Position, Pick up Object from Floor --   From Standing Position, Turn to Look  Behind Over each Shoulder --   Turn 360 Degrees --   Standing Unsupported, Alternately Place Feet on Step/Stool --   Standing Unsupported, One Foot in Front --   Standing on One Leg --   Total Score --   Timed Up and Go Test   TUG --   Normal TUG (seconds) --   Self-Care   Self-Care Other Self-Care Comments   Other Self-Care Comments  Per request of pt and family, this PT contacted Therapist, music and Orth[employer] in attempt to address lack of coverage for patient's AFO. Informed pt/family of prior authorization date, time, and representative who gave auth. Also provided pt/family with name and contact information for insurance company to contact [employer] to verify this.   Neuro Re-ed  Neuro Re-ed Details  --   Exercises   Exercises --   Other Exercises  --   Elbow Exercises   Elbow Extension --   Forearm Supination --                  PT Short Term Goals - 02/03/15 1324    PT SHORT TERM GOAL #1   Title Pt will perform HEP with family assistance/supervision, for lower extremitiy strengthening.  TARGET 02/06/15   Time 4   Period Weeks   Status Achieved   PT SHORT TERM GOAL #2   Title Pt will perform sit<>stand transfers with supervision, for improved efficiency and safety with transfers.   Baseline Met 11/22.   Time 4   Period Weeks   Status Achieved   PT SHORT TERM GOAL #3   Title Pt will improve TUG score to less than or equal to 70 seconds for decreased fall risk.   Baseline 52.62 seconds with R AFO and heel wedge;73 seconds without AFO   Time 4   Period Weeks   Status Achieved   PT SHORT TERM GOAL #4   Title Pt will ambulate at least 150 ft using least restrictive assistive device with minimal assistance for improved gait efficiency.   Baseline Met 11/22.   Time 4   Period Weeks   Status Achieved   PT SHORT TERM GOAL #5   Title Pt will verbalize understanding of CVA education.   Time 4   Period Weeks   Status Achieved           PT Long Term Goals  - 03/10/15 1413    PT LONG TERM GOAL #1   Title Pt will verbalize understanding of fall prevention within home environment.  Modified TARGET 04/07/15   Baseline Requires mod cues to recall on 03/10/15   Time 4   Period Weeks   Status On-going   PT LONG TERM GOAL #2   Title Pt will improve Timed Up and GO score to < or equal to 24.95 seconds for decreased fall risk.   Baseline 12/29: TUG = 27.95 sec   Time 4   Period Weeks   Status Revised   PT LONG TERM GOAL #3   Title Pt will improve gait velocity to 1.94 ft/sec for improved gait efficiency and safety.   Baseline 12/29: gait velocity = 1.34 ft/sec   Time 4   Period Weeks   Status Revised   PT LONG TERM GOAL #4   Title Pt will improve BERG balance score to 38/56 in order to indicate decreased fall risk.    Baseline 21/56 (01/12/15) to 34/56 on 03/10/15   Time 4   Period Weeks   Status Revised   PT LONG TERM GOAL #5   Title Pt will ambulate >300' using least restrictive assistive device at mod I level for improved efficiency and safety with gait.   Time 4   Period --   Status Revised   PT LONG TERM GOAL #6   Title Pt will verbalize plans for continued community fitness upon D/C from PT.   Time 4   Period Weeks   Status On-going               Plan - 03/18/15 1717    Clinical Impression Statement Session focused on gait training with SPC; increasing safety with fall recovery. Decreased sensory awareness/proprioceptive input in RUE continues to limit gait with SPC due to RUE frequently remaining in rigid elbow/shoulder  extension, which affects weight shift, trunk rotation, and reciprocal arm swing.   Pt will benefit from skilled therapeutic intervention in order to improve on the following deficits Abnormal gait;Decreased balance;Decreased mobility;Decreased coordination;Decreased strength;Difficulty walking;Impaired sensation;Impaired tone   Rehab Potential Good   PT Frequency 2x / week   PT Duration 4 weeks   PT  Treatment/Interventions ADLs/Self Care Home Management;Therapeutic exercise;Therapeutic activities;Functional mobility training;Gait training;Balance training;Neuromuscular re-education;Electrical Stimulation   PT Next Visit Plan  Consider practicing entering/exiting pulpit when daughter present.  Community obstacle negotiation with SPC. Continue to address RLE WB, NMR and coordination, gait w/ SPC   Consulted and Agree with Plan of Care Patient   Family Member Consulted wife, daughter        Problem List Patient Active Problem List   Diagnosis Date Noted  . Anterior cerebral circulation hemorrhagic infarction ([place]) 11/24/2014  . Hemiparesis affecting right side as late effect of cerebrovascular accident ([place]) 09/05/2014  . Depression due to stroke ([place]) 09/05/2014  . Thrombocytopenia ([place]) 06/05/2014  . Chronic diastolic heart failure, NYHA class 1 ([place]) 06/05/2014  . Ex-smoker 06/03/2014  . Benign paroxysmal positional vertigo 04/28/2014  . Right carotid bruit   . Polyneuropathy, diabetic ([place]) 08/05/2012  . Seasonal allergic rhinitis   . Trigger thumb of left hand 10/12/2011  . Gout   . Adenomatous polyps   . CAD (coronary artery disease)   . Essential hypertension   . Hyperlipidemia   . Type 2 diabetes, uncontrolled, with neuropathy ([place])   . Ejection fraction    Billie Ruddy, PT, DPT [facility] [address] [place], [place], [address] Phone: [phone]   Fax:  [phone] 03/18/2015, 5:22 PM   Name: ZYMEIR SALMINEN MRN: [id number] Date of Birth: [birth year]

## 2015-03-19 ENCOUNTER — Ambulatory Visit: Payer: PPO | Admitting: Occupational Therapy

## 2015-03-19 ENCOUNTER — Encounter: Payer: Self-pay | Admitting: Rehabilitation

## 2015-03-19 ENCOUNTER — Ambulatory Visit: Payer: PPO | Admitting: Rehabilitation

## 2015-03-19 ENCOUNTER — Encounter: Payer: Self-pay | Admitting: Occupational Therapy

## 2015-03-19 DIAGNOSIS — R2689 Other abnormalities of gait and mobility: Secondary | ICD-10-CM

## 2015-03-19 DIAGNOSIS — R278 Other lack of coordination: Secondary | ICD-10-CM

## 2015-03-19 DIAGNOSIS — M25611 Stiffness of right shoulder, not elsewhere classified: Secondary | ICD-10-CM

## 2015-03-19 DIAGNOSIS — R279 Unspecified lack of coordination: Principal | ICD-10-CM

## 2015-03-19 DIAGNOSIS — R269 Unspecified abnormalities of gait and mobility: Secondary | ICD-10-CM | POA: Diagnosis not present

## 2015-03-19 DIAGNOSIS — G8191 Hemiplegia, unspecified affecting right dominant side: Secondary | ICD-10-CM

## 2015-03-19 DIAGNOSIS — I69319 Unspecified symptoms and signs involving cognitive functions following cerebral infarction: Secondary | ICD-10-CM

## 2015-03-19 DIAGNOSIS — R2681 Unsteadiness on feet: Secondary | ICD-10-CM

## 2015-03-19 DIAGNOSIS — H539 Unspecified visual disturbance: Secondary | ICD-10-CM

## 2015-03-19 DIAGNOSIS — R270 Ataxia, unspecified: Secondary | ICD-10-CM

## 2015-03-19 NOTE — Therapy (Signed)
[facility] [address] [place], [place], [address] Phone: [phone]   Fax:  [phone]  Physical Therapy Treatment  Patient Details  Name: Jeffrey [name] MRN: [id number] Date of Birth: Jul 10, 1940 Referring Provider: Leonie Man  Encounter Date: 03/19/2015      PT End of Session - 03/19/15 1110    Visit Number 19   Number of Visits 25   Date for PT Re-Evaluation 04/09/15   Authorization Type Blue Medicare HMO-G- code every 10th visit   PT Start Time 1101   PT Stop Time 1145   PT Time Calculation (min) 44 min   Equipment Utilized During Treatment Gait belt   Activity Tolerance Patient tolerated treatment well   Behavior During Therapy [facility] for tasks assessed/performed      Past Medical History  Diagnosis Date  . CAD (coronary artery disease)     PCI distal RCA ...2004, residual 70% LAD   /   ...nuclear...03/2007...no ischemia.Marland KitchenMarland Kitchenpreserved LV /  nuclear...03/03/2009...inferior scar..no ischemia..EF 51%  . Dyslipidemia     takes Atorvastatin daily  . Internal hemorrhoids   . Gout     takes Allopurinol daily and Colchicine as needed  . Seasonal allergic rhinitis   . Right carotid bruit   . Cyst of nasopharynx     per ENT Wilburn Cornelia  . Myocardial infarction [facility]) 59yr ago  . Peripheral edema     takes Lasix daily  . HTN (hypertension)     takes Cardura,Metoprolol,[name],and Amlodipine daily  . Arthritis   . GERD (gastroesophageal reflux disease)     takes Omeprazole daily  . History of colon polyps   . Type 2 diabetes, uncontrolled, with neuropathy ([place] 1989    takes Invokana daily and has an insulin pump ([name]  . 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 HCAP (healthcare-associated pneumonia)   . Hemiparesis affecting right side as late effect of cerebrovascular accident ([place] 09/05/2014  . Anterior cerebral circulation hemorrhagic infarction ([facility]  11/24/2014    March, 2016, dominant left thalamic and left internal capsule ischemic infarct with resultant hemorrhagic transformation, secondary to small vessel disease. Resulted in right hemiparesis dysarthria and diplopia   //   readmission with aphasia July, 2016, this improved     Past Surgical History  Procedure Laterality Date  . Cataract extraction Bilateral 2010  . Elbow surgery Right 1997  . Balloon angioplasty, artery  1992, 2004    CAD, Dr. KRon Parker . Colonoscopy  04/03/2002    adenomatous polyp, int hemorrhoids  . Colonoscopy  07/18/2007    normal (Dr. SFuller Plan  . Rotator cuff repair  09/2011    left, with subacromial decompression  . Nasal septum surgery    . Colonoscopy  09/26/2012    tubular adenoma, sm int hem, rpt 5 yrs (Fuller Plan  . Tonsillectomy and adenoidectomy    . Eye lids raised    . Cardiac catheterization  2004  . Polypectomy N/A 05/27/2013    Procedure: ENDOSCOPIC NASOPHARYNGEAL MASS;  Surgeon: DJerrell Belfast MD    There were no vitals filed for this visit.  Visit Diagnosis:  Decreased coordination  Ataxia  Abnormality of gait  Unsteadiness  Decreased functional mobility      Subjective Assessment - 03/19/15 1109    Subjective No changes since last visit.     Pertinent History Blood glucose may drop when pt has 2 therapies in a row. Pt's family packs bag of snacks  to eat if pt feels hypoglycemic.   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   Currently in Pain? No/denies            NMR:  Addressed gait without device but with walking canes (used in BUEs) for increased arm swing but still in somewhat closed chain x 230' at mod to total A (one single overt LOB needing total A to prevent fall) level with continuous cues at R UE for increased R arm swing as he continues to "stiffen" arm and  decrease trunk movement, esp when changing directions or adjusting for enviornment.  Also had pt work in stance moving arms in over exaggerated swing pattern to increase arm swing and trunk rotation with marked improvement vs with gait (decreased ability for closed chain, too many unstable variables).  Progressed to having pt ambulate with 4lb weight on RUE and walk without device in order to increase pts proprioceptive feedback of RUE to prevent over shoulder elevation and rigid trunk.  Note mild improvement and pt better able to keep RUE in "resting" position by side during gait rather than in extended IR position or flexed close to trunk.  Will continue to work on these deficits in smaller increments to improve pts success and work towards LTG's.    Did have brief discussion with pt and daughter regarding w/c use in home, use of RW bag to increase independence with carrying items and using w/c for office chair only (as current chair is unsafe).  Both verbalized understanding.                      PT Education - 03/19/15 1110    Education provided Yes   Education Details Continued education on ONLY using wheelchair as office chair (as current office chair is unstable), education again on RW bag to place travel coffee mug in to prevent need for w/c.    Person(s) Educated Patient   Methods Explanation   Comprehension Verbalized understanding          PT Short Term Goals - 02/03/15 1324    PT SHORT TERM GOAL #1   Title Pt will perform HEP with family assistance/supervision, for lower extremitiy strengthening.  TARGET 02/06/15   Time 4   Period Weeks   Status Achieved   PT SHORT TERM GOAL #2   Title Pt will perform sit<>stand transfers with supervision, for improved efficiency and safety with transfers.   Baseline Met 11/22.   Time 4   Period Weeks   Status Achieved   PT SHORT TERM GOAL #3   Title Pt will improve TUG score to less than or equal to 70 seconds for decreased  fall risk.   Baseline 52.62 seconds with R AFO and heel wedge;73 seconds without AFO   Time 4   Period Weeks   Status Achieved   PT SHORT TERM GOAL #4   Title Pt will ambulate at least 150 ft using least restrictive assistive device with minimal assistance for improved gait efficiency.   Baseline Met 11/22.   Time 4   Period Weeks   Status Achieved   PT SHORT TERM GOAL #5   Title Pt will verbalize understanding of CVA education.   Time 4   Period Weeks   Status Achieved  PT Long Term Goals - 03/10/15 1413    PT LONG TERM GOAL #1   Title Pt will verbalize understanding of fall prevention within home environment.  Modified TARGET 04/07/15   Baseline Requires mod cues to recall on 03/10/15   Time 4   Period Weeks   Status On-going   PT LONG TERM GOAL #2   Title Pt will improve Timed Up and GO score to < or equal to 24.95 seconds for decreased fall risk.   Baseline 12/29: TUG = 27.95 sec   Time 4   Period Weeks   Status Revised   PT LONG TERM GOAL #3   Title Pt will improve gait velocity to 1.94 ft/sec for improved gait efficiency and safety.   Baseline 12/29: gait velocity = 1.34 ft/sec   Time 4   Period Weeks   Status Revised   PT LONG TERM GOAL #4   Title Pt will improve BERG balance score to 38/56 in order to indicate decreased fall risk.    Baseline 21/56 (01/12/15) to 34/56 on 03/10/15   Time 4   Period Weeks   Status Revised   PT LONG TERM GOAL #5   Title Pt will ambulate >300' using least restrictive assistive device at mod I level for improved efficiency and safety with gait.   Time 4   Period --   Status Revised   PT LONG TERM GOAL #6   Title Pt will verbalize plans for continued community fitness upon D/C from PT.   Time 4   Period Weeks   Status On-going               Plan - 03/19/15 1110    Clinical Impression Statement Skilled session focused on NMR with gait for improved RUE placement and increased trunk rotation.  Did so with use of  walking poles and also with weight on RUE to decrease amount of R shoulder elevation.   Tolerated well, however note that with decreased closed chain activity, and direction changes, pt tends to have mass loss in balance and coordination.  Will continue to address.    Pt will benefit from skilled therapeutic intervention in order to improve on the following deficits Abnormal gait;Decreased balance;Decreased mobility;Decreased coordination;Decreased strength;Difficulty walking;Impaired sensation;Impaired tone   Rehab Potential Good   PT Frequency 2x / week   PT Duration 4 weeks   PT Treatment/Interventions ADLs/Self Care Home Management;Therapeutic exercise;Therapeutic activities;Functional mobility training;Gait training;Balance training;Neuromuscular re-education;Electrical Stimulation   PT Next Visit Plan Continue to address RLE WB, NMR and coordination, gait w/ SPC, consider adding small controlled movements with single "anchor" (RUE with LUE, etc), increasing proximal control   Consulted and Agree with Plan of Care Patient   Family Member Consulted wife, daughter        Problem List Patient Active Problem List   Diagnosis Date Noted  . Anterior cerebral circulation hemorrhagic infarction ([place]) 11/24/2014  . Hemiparesis affecting right side as late effect of cerebrovascular accident ([place]) 09/05/2014  . Depression due to stroke ([place]) 09/05/2014  . Thrombocytopenia ([place]) 06/05/2014  . Chronic diastolic heart failure, NYHA class 1 ([place]) 06/05/2014  . Ex-smoker 06/03/2014  . Benign paroxysmal positional vertigo 04/28/2014  . Right carotid bruit   . Polyneuropathy, diabetic ([place]) 08/05/2012  . Seasonal allergic rhinitis   . Trigger thumb of left hand 10/12/2011  . Gout   . Adenomatous polyps   . CAD (coronary artery disease)   . Essential hypertension   . Hyperlipidemia   .  Type 2 diabetes, uncontrolled, with neuropathy ([place])   . Ejection fraction     Cameron Sprang, PT, MPT [facility] [address] [place], [place], [address] Phone: [phone]   Fax:  [phone] 03/19/2015, 12:32 PM  Name: Jeffrey [name] MRN: [id number] Date of Birth: 1940-07-21

## 2015-03-19 NOTE — Therapy (Signed)
[facility] [address] [place], [place], [address] Phone: [phone]   Fax:  [phone]  Occupational Therapy Treatment  Patient Details  Name: Jeffrey [name] MRN: [id number] Date of Birth: 11/22/1940 Referring Provider: Dr. Leonie Man  Encounter Date: 03/19/2015      OT End of Session - 03/19/15 1213    Visit Number 18   Number of Visits 32   Date for OT Re-Evaluation 05/09/15   Authorization Type [employer] H[employer], no visit limit/no auth, G-code   Authorization Time Period renewal completed 04/09/27, cert period 09/13/87-04/07/17, next wk will be 1/8   Authorization - Visit Number 38   Authorization - Number of Visits 20   OT Start Time 1015   OT Stop Time 1058   OT Time Calculation (min) 43 min   Activity Tolerance Patient tolerated treatment well      Past Medical History  Diagnosis Date  . CAD (coronary artery disease)     PCI distal RCA ...2004, residual 70% LAD   /   ...nuclear...03/2007...no ischemia.Marland KitchenMarland Kitchenpreserved LV /  nuclear...03/03/2009...inferior scar..no ischemia..EF 51%  . Dyslipidemia     takes Atorvastatin daily  . Internal hemorrhoids   . Gout     takes Allopurinol daily and Colchicine as needed  . Seasonal allergic rhinitis   . Right carotid bruit   . Cyst of nasopharynx     per ENT Wilburn Cornelia  . Myocardial infarction [facility]) 34yr ago  . Peripheral edema     takes Lasix daily  . HTN (hypertension)     takes Cardura,Metoprolol,[name],and Amlodipine daily  . Arthritis   . GERD (gastroesophageal reflux disease)     takes Omeprazole daily  . History of colon polyps   . Type 2 diabetes, uncontrolled, with neuropathy ([place] 1989    takes Invokana daily and has an insulin pump ([name]  . 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 HCAP (healthcare-associated pneumonia)   . Hemiparesis affecting right side as late effect of cerebrovascular accident  ([facility] 09/05/2014  . Anterior cerebral circulation hemorrhagic infarction ([facility] 11/24/2014    March, 2016, dominant left thalamic and left internal capsule ischemic infarct with resultant hemorrhagic transformation, secondary to small vessel disease. Resulted in right hemiparesis dysarthria and diplopia   //   readmission with aphasia July, 2016, this improved     Past Surgical History  Procedure Laterality Date  . Cataract extraction Bilateral 2010  . Elbow surgery Right 1997  . Balloon angioplasty, artery  1992, 2004    CAD, Dr. KRon Parker . Colonoscopy  04/03/2002    adenomatous polyp, int hemorrhoids  . Colonoscopy  07/18/2007    normal (Dr. SFuller Plan  . Rotator cuff repair  09/2011    left, with subacromial decompression  . Nasal septum surgery    . Colonoscopy  09/26/2012    tubular adenoma, sm int hem, rpt 5 yrs (Fuller Plan  . Tonsillectomy and adenoidectomy    . Eye lids raised    . Cardiac catheterization  2004  . Polypectomy N/A 05/27/2013    Procedure: ENDOSCOPIC NASOPHARYNGEAL MASS;  Surgeon: DJerrell Belfast MD    There were no vitals filed for this visit.  Visit Diagnosis:  Ataxia  Decreased coordination  Stiffness of right shoulder joint  Right hemiparesis (HCC)  Visual disturbance  Cognitive deficits following cerebral infarction      Subjective Assessment - 03/19/15 1023    Subjective  I feel like  I have really made some progress.    Pertinent History see epic   Patient Stated Goals utilization of right arm "I wish I had more control of it"   Currently in Pain? No/denies                      OT Treatments/Exercises (OP) - 03/19/15 0001    Neurological Re-education Exercises   Other Exercises 1 Neuro re ed to address RUE reach, grasp and release and 2 point pinch with activities requiring more fine motor. Utlized assisted reach technique (pt using his left hand to assist with R) in order to create closed chain loop and place for pt to stabilize and  "anchor" other than through shoulder hiking  and head fixation. With practice pt able to greatly improve in his ability to self monitor abnormal posturing for mid reach activities and pt able to successfully reach to 70* of shoulder flexion with min facilitation and min - mod vc's.                 OT Education - 03/18/15 1749    Education provided Yes   Person(s) Educated Patient   Methods Explanation;Demonstration;Verbal cues   Comprehension Need further instruction;Verbal cues required          OT Short Term Goals - 03/19/15 1211    OT SHORT TERM GOAL #1   Title Pt will be independent with initial HEP.--check STGs 02/10/15   Time 4   Period Weeks   Status Achieved  03/10/15   OT SHORT TERM GOAL #2   Title Pt will use RUE as gross A/stabilizer at least 25% of the time for ADLs.   Time 4   Period Weeks   Status Achieved  02/23/15     OT SHORT TERM GOAL #3   Title Pt will demo at least 55* R shoulder flex with min compensation in prep for functional reach.   Baseline 45*   Time 4   Period Weeks   Status On-going  03/10/15:  50* with min-mod compensation/cueing   OT SHORT TERM GOAL #4   Title Pt will be able to complete clothing fasteners (buttons, tying shoes) mod I using AE prn.   Time 4   Period Weeks   Status Achieved  02/23/15   OT SHORT TERM GOAL #5   Title Pt will be able to grasp/release cylinder object independently using good control in 7/10 attempts without drops.--check 04/11/15   Time 4   Period Weeks   Status New           OT Long Term Goals - 03/19/15 1211    OT LONG TERM GOAL #1   Title Pt will be independent with updated HEP.--check LTGs 05/09/15   Time 8   Period Weeks   Status On-going  03/12/15:  not fully met/ongoing--continue for renewal period   OT LONG TERM GOAL #2   Title Pt will use RUE as gross A/stabilizer for ADLs at least 50% of the time.   Time 8   Period Weeks   Status Achieved  03/10/15   OT LONG TERM GOAL #3   Title Pt will  demo at least 60* R shoulder flex with min compensation in prep for functional reach.   Baseline 45*   Time 8   Period Weeks   Status Revised  03/10/15:  50* with min-mod compensation/cueing; revised 03/12/15   OT LONG TERM GOAL #4   Title Pt will improve RUE  functional reaching/coordination for ADLs as shown by improving score on box and blocks test to at least 15 blocks.--continue for renewal, check 05/09/15   Baseline 9 blocks   Time 8   Period Weeks   Status On-going  03/12/15:  10 blocks   OT LONG TERM GOAL #5   Title Pt will perform bathing with supervision.   Time 8   Period Weeks   Status Achieved   OT LONG TERM GOAL #6   Title Pt will verbalize understanding of cognitive/visual compensation strategies and activities to improve cognition/vision prn.   Time 8   Period Weeks   Status Achieved  03/12/15   OT LONG TERM GOAL #7   Title Pt will be able to grasp/release cylinder object independently using good control in 9/10 attempts without drops.--check 04/11/15   Baseline 8   Period Weeks   Status New   OT LONG TERM GOAL #8   Title Pt will be able to write name with RUE with at least 75% legiblity using AE prn.   Time 8   Period Weeks   Status New               Plan - 03/19/15 1211    Clinical Impression Statement Pt making slow progress and benefits from assisted reach technique to improve ability to reach and use R hand in functional tasks.    Pt will benefit from skilled therapeutic intervention in order to improve on the following deficits (Retired) Decreased cognition;Decreased mobility;Decreased strength;Impaired vision/preception;Impaired UE functional use;Decreased knowledge of use of DME;Decreased balance;Impaired tone;Impaired sensation;Decreased coordination;Decreased range of motion   Rehab Potential Good   Clinical Impairments Affecting Rehab Potential severity of deficits, sensory deficits, ataxia   OT Frequency 2x / week   OT Duration 8 weeks   OT  Treatment/Interventions Self-care/ADL training;Therapeutic exercise;Functional Mobility Training;Patient/family education;Ultrasound;Neuromuscular education;Manual Therapy;Splinting;Therapeutic exercises;Cryotherapy;Parrafin;DME and/or AE instruction;Therapeutic activities;Cognitive remediation/compensation;Visual/perceptual remediation/compensation;Passive range of motion;Moist Heat;Contrast Bath;Fluidtherapy;Electrical Stimulation   Plan NMR RUE, functional use, reinforce assisted reach technique   OT Home Exercise Plan Education provided:  initial HEP issued; 03/03/15 memory compensations, cognitive/visual HEP, coordination HEP issued   Consulted and Agree with Plan of Care Patient        Problem List Patient Active Problem List   Diagnosis Date Noted  . Anterior cerebral circulation hemorrhagic infarction ([place]) 11/24/2014  . Hemiparesis affecting right side as late effect of cerebrovascular accident ([place]) 09/05/2014  . Depression due to stroke ([place]) 09/05/2014  . Thrombocytopenia ([place]) 06/05/2014  . Chronic diastolic heart failure, NYHA class 1 ([place]) 06/05/2014  . Ex-smoker 06/03/2014  . Benign paroxysmal positional vertigo 04/28/2014  . Right carotid bruit   . Polyneuropathy, diabetic ([place]) 08/05/2012  . Seasonal allergic rhinitis   . Trigger thumb of left hand 10/12/2011  . Gout   . Adenomatous polyps   . CAD (coronary artery disease)   . Essential hypertension   . Hyperlipidemia   . Type 2 diabetes, uncontrolled, with neuropathy ([place])   . Ejection fraction     Quay Burow, OTR/L 03/19/2015, 12:14 PM  [facility] [address] [place], [place], [address] Phone: [phone]   Fax:  [phone]  Name: Jeffrey [name] MRN: [id number] Date of Birth: 01-29-1941

## 2015-03-19 NOTE — Patient Instructions (Signed)
Pt instructed in assisted reach to create closed chain reach.

## 2015-03-23 ENCOUNTER — Ambulatory Visit (INDEPENDENT_AMBULATORY_CARE_PROVIDER_SITE_OTHER): Payer: PPO | Admitting: Internal Medicine

## 2015-03-23 ENCOUNTER — Encounter: Payer: Self-pay | Admitting: Internal Medicine

## 2015-03-23 VITALS — BP 147/67 | HR 57 | Temp 98.1°F | Wt 164.0 lb

## 2015-03-23 DIAGNOSIS — H9202 Otalgia, left ear: Secondary | ICD-10-CM | POA: Diagnosis not present

## 2015-03-23 DIAGNOSIS — H9209 Otalgia, unspecified ear: Secondary | ICD-10-CM | POA: Insufficient documentation

## 2015-03-23 NOTE — Progress Notes (Signed)
Subjective:    Patient ID: Jeffrey [name], male    DOB: 1940/07/13, 75 y.o.   MRN: [id number]  HPI  75YO male presents for acute visit.  Left ear pain - Thinks part of hearing aid in left ear. Notes a clicking sound with movement of his jaw. No drainage from ear.    Wt Readings from Last 3 Encounters:  03/23/15 164 lb (74.39 kg)  02/23/15 167 lb 8 oz (75.978 kg)  12/29/14 162 lb 12.8 oz (73.846 kg)   BP Readings from Last 3 Encounters:  03/23/15 147/67  02/23/15 124/72  12/29/14 148/84    Past Medical History  Diagnosis Date  . CAD (coronary artery disease)     PCI distal RCA ...2004, residual 70% LAD   /   ...nuclear...03/2007...no ischemia.Marland KitchenMarland Kitchenpreserved LV /  nuclear...03/03/2009...inferior scar..no ischemia..EF 51%  . Dyslipidemia     takes Atorvastatin daily  . Internal hemorrhoids   . Gout     takes Allopurinol daily and Colchicine as needed  . Seasonal allergic rhinitis   . Right carotid bruit   . Cyst of nasopharynx     per ENT Wilburn Cornelia  . Myocardial infarction [facility]) 42yrs ago  . Peripheral edema     takes Lasix daily  . HTN (hypertension)     takes Cardura,Metoprolol,[name],and Amlodipine daily  . Arthritis   . GERD (gastroesophageal reflux disease)     takes Omeprazole daily  . History of colon polyps   . Type 2 diabetes, uncontrolled, with neuropathy ([place]) 1989    takes Invokana daily and has an insulin pump (Dr. [name])  . 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 HCAP (healthcare-associated pneumonia)   . Hemiparesis affecting right side as late effect of cerebrovascular accident ([place]) 09/05/2014  . Anterior cerebral circulation hemorrhagic infarction [facility]) 11/24/2014    March, 2016, dominant left thalamic and left internal capsule ischemic infarct with resultant hemorrhagic transformation, secondary to small vessel disease. Resulted in right hemiparesis dysarthria and diplopia   //   readmission with  aphasia July, 2016, this improved    Family History  Problem Relation Age of Onset  . Alcohol abuse Father   . Coronary artery disease Neg Hx   . Stroke Neg Hx   . Cancer Neg Hx   . Diabetes Neg Hx   . Colon cancer Neg Hx   . Heart attack Father   . Heart attack Son    Past Surgical History  Procedure Laterality Date  . Cataract extraction Bilateral 2010  . Elbow surgery Right 1997  . Balloon angioplasty, artery  1992, 2004    CAD, Dr. Ron Parker  . Colonoscopy  04/03/2002    adenomatous polyp, int hemorrhoids  . Colonoscopy  07/18/2007    normal (Dr. Fuller Plan)  . Rotator cuff repair  09/2011    left, with subacromial decompression  . Nasal septum surgery    . Colonoscopy  09/26/2012    tubular adenoma, sm int hem, rpt 5 yrs Fuller Plan)  . Tonsillectomy and adenoidectomy    . Eye lids raised    . Cardiac catheterization  2004  . Polypectomy N/A 05/27/2013    Procedure: ENDOSCOPIC NASOPHARYNGEAL MASS;  Surgeon: [name], MD   Social History   Social History  . Marital Status: Married    Spouse Name: N/A  . Number of Children: 2  . Years of Education: N/A   Occupational History  . [employer]  Social History Main Topics  . Smoking status: Former Research scientist (life sciences)  . Smokeless tobacco: Never Used     Comment: quit smoking 45 years ago.  . Alcohol Use: No  . Drug Use: No  . Sexual Activity: Yes   Other Topics Concern  . None   Social History Narrative   Caffeine: 6-7 cups coffee   Lives with wife, no pets   2 grown children, 4 grandchildren   Occu: Artist   Edu: [employer]   Activity: not much   Diet: plenty of water, fruits and vegetables   Some care through [place]      Endo: Dr. Buddy Duty    Review of Systems  Constitutional: Negative for fever and chills.  HENT: Positive for ear pain and hearing loss. Negative for rhinorrhea, sinus pressure and tinnitus.        Objective:    BP 147/67 mmHg  Pulse 57  Temp(Src) 98.1 F (36.7 C) (Oral)  Wt 164  lb (74.39 kg)  SpO2 99% Physical Exam  Constitutional: He appears well-developed and well-nourished.  HENT:  Head: Normocephalic and atraumatic.  Right Ear: Tympanic membrane, external ear and ear canal normal.  Left Ear: Hearing, tympanic membrane, external ear and ear canal normal.  Eyes: Pupils are equal, round, and reactive to light.  Neck: Normal range of motion.  Pulmonary/Chest: Effort normal.          Assessment & Plan:   Problem List Items Addressed This Visit      Unprioritized   Ear pain - Primary    Left ear pain. Concern for part of hearing aid in ear. Exam today normal ear canal without foreign body. Discussed possible referral to ENT, if there may be area closer to TM that I cannot visualize. He declines for now. He will call if he would like to schedule this.          Return if symptoms worsen or fail to improve.

## 2015-03-23 NOTE — Patient Instructions (Addendum)
Follow up if symptoms are not improving.

## 2015-03-23 NOTE — Assessment & Plan Note (Signed)
Left ear pain. Concern for part of hearing aid in ear. Exam today normal ear canal without foreign body. Discussed possible referral to ENT, if there may be area closer to TM that I cannot visualize. He declines for now. He will call if he would like to schedule this.

## 2015-03-23 NOTE — Progress Notes (Signed)
Pre visit review using our clinic review tool, if applicable. No additional management support is needed unless otherwise documented below in the visit note. 

## 2015-03-24 ENCOUNTER — Ambulatory Visit: Payer: PPO | Admitting: Occupational Therapy

## 2015-03-24 ENCOUNTER — Encounter: Payer: Self-pay | Admitting: Occupational Therapy

## 2015-03-24 ENCOUNTER — Ambulatory Visit: Payer: PPO | Admitting: Rehabilitation

## 2015-03-24 DIAGNOSIS — R2681 Unsteadiness on feet: Secondary | ICD-10-CM

## 2015-03-24 DIAGNOSIS — R279 Unspecified lack of coordination: Secondary | ICD-10-CM

## 2015-03-24 DIAGNOSIS — I69319 Unspecified symptoms and signs involving cognitive functions following cerebral infarction: Secondary | ICD-10-CM

## 2015-03-24 DIAGNOSIS — R269 Unspecified abnormalities of gait and mobility: Secondary | ICD-10-CM

## 2015-03-24 DIAGNOSIS — R270 Ataxia, unspecified: Secondary | ICD-10-CM

## 2015-03-24 DIAGNOSIS — H539 Unspecified visual disturbance: Secondary | ICD-10-CM

## 2015-03-24 DIAGNOSIS — R278 Other lack of coordination: Secondary | ICD-10-CM

## 2015-03-24 DIAGNOSIS — R2689 Other abnormalities of gait and mobility: Secondary | ICD-10-CM

## 2015-03-24 DIAGNOSIS — M25611 Stiffness of right shoulder, not elsewhere classified: Secondary | ICD-10-CM

## 2015-03-24 DIAGNOSIS — G8191 Hemiplegia, unspecified affecting right dominant side: Secondary | ICD-10-CM

## 2015-03-24 NOTE — Therapy (Signed)
[facility] [address] [place], [place], [address] Phone: [phone]   Fax:  [phone]  Occupational Therapy Treatment  Patient Details  Name: Jeffrey [name] MRN: [id number] Date of Birth: [birth year] Referring Provider: Dr. Leonie Man  Encounter Date: 03/24/2015      OT End of Session - 03/24/15 1725    Visit Number 19   Number of Visits 32   Date for OT Re-Evaluation 05/09/15   Authorization Type [employer] H[employer], no visit limit/no auth, G-code   Authorization Time Period renewal completed 03/14/59, cert period 6/0/73-09/04/04, next wk will be 1/8   Authorization - Visit Number 14   Authorization - Number of Visits 20   OT Start Time 1620   OT Stop Time 1702   OT Time Calculation (min) 42 min   Activity Tolerance Patient tolerated treatment well      Past Medical History  Diagnosis Date  . CAD (coronary artery disease)     PCI distal RCA ...2004, residual 70% LAD   /   ...nuclear...03/2007...no ischemia.Marland KitchenMarland Kitchenpreserved LV /  nuclear...03/03/2009...inferior scar..no ischemia..EF 51%  . Dyslipidemia     takes Atorvastatin daily  . Internal hemorrhoids   . Gout     takes Allopurinol daily and Colchicine as needed  . Seasonal allergic rhinitis   . Right carotid bruit   . Cyst of nasopharynx     per ENT Wilburn Cornelia  . Myocardial infarction [facility]) 44yr ago  . Peripheral edema     takes Lasix daily  . HTN (hypertension)     takes Cardura,Metoprolol,[name],and Amlodipine daily  . Arthritis   . GERD (gastroesophageal reflux disease)     takes Omeprazole daily  . History of colon polyps   . Type 2 diabetes, uncontrolled, with neuropathy ([place] 1989    takes Invokana daily and has an insulin pump ([name]  . 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 HCAP (healthcare-associated pneumonia)   . Hemipares[place] affecting right [place] as [place]  [place]  . Anterior cerebral circulation hemorrhagic infarction ([facility] 11/24/2014    March, 2016, dominant left thalamic and left internal capsule ischemic infarct with resultant hemorrhagic transformation, secondary to small vessel disease. Resulted in right hemiparesis dysarthria and diplopia   //   readmission with aphasia July, 2016, this improved     Past Surgical History  Procedure Laterality Date  . Cataract extraction Bilateral 2010  . Elbow surgery Right 1997  . Balloon angioplasty, artery  1992, 2004    CAD, Dr. KRon Parker . Colonoscopy  04/03/2002    adenomatous polyp, int hemorrhoids  . Colonoscopy  07/18/2007    normal (Dr. SFuller Plan  . Rotator cuff repair  09/2011    left, with subacromial decompression  . Nasal septum surgery    . Colonoscopy  09/26/2012    tubular adenoma, sm int hem, rpt 5 yrs (Fuller Plan  . Tonsillectomy and adenoidectomy    . Eye lids raised    . Cardiac catheterization  2004  . Polypectomy N/A 05/27/2013    Procedure: ENDOSCOPIC NASOPHARYNGEAL MASS;  Surgeon: DJerrell Belfast MD    There were no vitals filed for this visit.  Visit Diagnosis:  Ataxia  Decreased coordination  Stiffness of right shoulder joint  Right hemiparesis (HCC)  Visual disturbance  Cognitive deficits following cerebral infarction      Subjective Assessment - 03/24/15 1630    Subjective  I can use  my hand to hold things and stabiize things but every thing else is pretty hard.    Pertinent History see epic   Patient Stated Goals utilization of right arm "I wish I had more control of it"   Currently in Pain? No/denies                      OT Treatments/Exercises (OP) - 03/24/15 0001    Neurological Re-education Exercises   Other Exercises 1 Neuro re ed in sitting to address bilateral overhead reach in closed chain activity with min faciliation to approximately 110* of flexion.  Progressed to assisted reach for low to mid reach with functional task of grasp  and release. Also addressed low reach with grasp and relase activity unilateral with RUE only - pt able to do 9/10 trials without drops with cues to go slow.  Pt also able to complete assisted reach to approximately 95* with functional grasp and relase task.  Practiced using assisted reach for drinking from a cup. Pt given this along with assisted low reach activities for home activity progam. This was also put in writing and shared and explained to wife. Wife and pt able to verbalize understanding.                    OT Short Term Goals - 03/24/15 1715    OT SHORT TERM GOAL #1   Title Pt will be independent with initial HEP.--check STGs 02/10/15   Time 4   Period Weeks   Status Achieved  03/10/15   OT SHORT TERM GOAL #2   Title Pt will use RUE as gross A/stabilizer at least 25% of the time for ADLs.   Time 4   Period Weeks   Status Achieved  02/23/15     OT SHORT TERM GOAL #3   Title Pt will demo at least 55* R shoulder flex with min compensation in prep for functional reach.   Baseline 45*   Time 4   Period Weeks   Status On-going  03/10/15:  50* with min-mod compensation/cueing   OT SHORT TERM GOAL #4   Title Pt will be able to complete clothing fasteners (buttons, tying shoes) mod I using AE prn.   Time 4   Period Weeks   Status Achieved  02/23/15   OT SHORT TERM GOAL #5   Title Pt will be able to grasp/release cylinder object independently using good control in 7/10 attempts without drops.--check 04/11/15   Time 4   Period Weeks   Status New           OT Long Term Goals - 03/24/15 1716    OT LONG TERM GOAL #1   Title Pt will be independent with updated HEP.--check LTGs 05/09/15   Time 8   Period Weeks   Status On-going  03/12/15:  not fully met/ongoing--continue for renewal period   OT LONG TERM GOAL #2   Title Pt will use RUE as gross A/stabilizer for ADLs at least 50% of the time.   Time 8   Period Weeks   Status Achieved  03/10/15   OT LONG TERM GOAL #3    Title Pt will demo at least 60* R shoulder flex with min compensation in prep for functional reach.   Baseline 45*   Time 8   Period Weeks   Status Revised  03/10/15:  50* with min-mod compensation/cueing; revised 03/12/15   OT LONG TERM GOAL #4   Title  Pt will improve RUE functional reaching/coordination for ADLs as shown by improving score on box and blocks test to at least 15 blocks.--continue for renewal, check 05/09/15   Baseline 9 blocks   Time 8   Period Weeks   Status On-going  03/12/15:  10 blocks   OT LONG TERM GOAL #5   Title Pt will perform bathing with supervision.   Time 8   Period Weeks   Status Achieved   OT LONG TERM GOAL #6   Title Pt will verbalize understanding of cognitive/visual compensation strategies and activities to improve cognition/vision prn.   Time 8   Period Weeks   Status Achieved  03/12/15   OT LONG TERM GOAL #7   Title Pt will be able to grasp/release cylinder object independently using good control in 9/10 attempts without drops.--check 04/11/15   Baseline 8   Period Weeks   Status New   OT LONG TERM GOAL #8   Title Pt will be able to write name with RUE with at least 75% legiblity using AE prn.   Time 8   Period Weeks   Status New               Plan - 03/24/15 1717    Clinical Impression Statement Pt making slow progress toward goals. Pt given activities to do at home using assisted reach technique.    Pt will benefit from skilled therapeutic intervention in order to improve on the following deficits (Retired) Decreased cognition;Decreased mobility;Decreased strength;Impaired vision/preception;Impaired UE functional use;Decreased knowledge of use of DME;Decreased balance;Impaired tone;Impaired sensation;Decreased coordination;Decreased range of motion   Rehab Potential Good   Clinical Impairments Affecting Rehab Potential severity of deficits, sensory deficits, ataxia   OT Frequency 2x / week   OT Duration 8 weeks   OT Treatment/Interventions  Self-care/ADL training;Therapeutic exercise;Functional Mobility Training;Patient/family education;Ultrasound;Neuromuscular education;Manual Therapy;Splinting;Therapeutic exercises;Cryotherapy;Parrafin;DME and/or AE instruction;Therapeutic activities;Cognitive remediation/compensation;Visual/perceptual remediation/compensation;Passive range of motion;Moist Heat;Contrast Bath;Fluidtherapy;Electrical Stimulation   OT Home Exercise Plan Education provided:  initial HEP issued; 03/03/15 memory compensations, cognitive/visual HEP, coordination HEP issued   Consulted and Agree with Plan of Care Patient   Family Member Consulted wife,         Problem List Patient Active Problem List   Diagnosis Date Noted  . Ear pain 03/23/2015  . Anterior cerebral circulation hemorrhagic infarction ([place]) 11/24/2014  . Hemiparesis affecting right side as late effect of cerebrovascular accident ([place]) 09/05/2014  . Depression due to stroke ([place]) 09/05/2014  . Thrombocytopenia ([place]) 06/05/2014  . Chronic diastolic heart failure, NYHA class 1 ([place]) 06/05/2014  . Ex-smoker 06/03/2014  . Benign paroxysmal positional vertigo 04/28/2014  . Right carotid bruit   . Polyneuropathy, diabetic ([place]) 08/05/2012  . Seasonal allergic rhinitis   . Trigger thumb of left hand 10/12/2011  . Gout   . Adenomatous polyps   . CAD (coronary artery disease)   . Essential hypertension   . Hyperlipidemia   . Type 2 diabetes, uncontrolled, with neuropathy ([place])   . Ejection fraction     Quay Burow, OTR/L 03/24/2015, 5:27 PM  [facility] [address] [place], [place], [address] Phone: [phone]   Fax:  [phone]  Name: Jeffrey [name] MRN: [id number] Date of Birth: 03-Dec-1940

## 2015-03-24 NOTE — Patient Instructions (Signed)
Activities to do with your right hand at home:  Practice these every day as you go through your day!!!! SHOULDER DOWN AND LEAD WITH YOUR HAND!!!  1. When drinking from a cup or water bottle, do assisted reach to use your right hand to pick up water bottle (left hand steadying) and bring to your mouth.  Then place water bottle back on table with right hand (assisted reach).    2. Practice low reach using the assisted reach technique for picking up or setting down light objects.  GO SLOW and try and control it.

## 2015-03-25 ENCOUNTER — Encounter: Payer: Self-pay | Admitting: Rehabilitation

## 2015-03-25 ENCOUNTER — Ambulatory Visit: Payer: PPO | Admitting: Occupational Therapy

## 2015-03-25 ENCOUNTER — Encounter: Payer: Self-pay | Admitting: Primary Care

## 2015-03-25 ENCOUNTER — Ambulatory Visit (INDEPENDENT_AMBULATORY_CARE_PROVIDER_SITE_OTHER): Payer: PPO | Admitting: Primary Care

## 2015-03-25 ENCOUNTER — Ambulatory Visit: Payer: PPO | Admitting: Rehabilitation

## 2015-03-25 VITALS — BP 134/64 | HR 68 | Temp 97.9°F | Ht 68.0 in | Wt 162.4 lb

## 2015-03-25 DIAGNOSIS — L989 Disorder of the skin and subcutaneous tissue, unspecified: Secondary | ICD-10-CM | POA: Diagnosis not present

## 2015-03-25 DIAGNOSIS — R238 Other skin changes: Secondary | ICD-10-CM

## 2015-03-25 MED ORDER — CEPHALEXIN 500 MG PO CAPS: 500.0000 mg | ORAL_CAPSULE | Freq: Two times a day (BID) | ORAL | Status: DC

## 2015-03-25 NOTE — Progress Notes (Signed)
Pre visit review using our clinic review tool, if applicable. No additional management support is needed unless otherwise documented below in the visit note. 

## 2015-03-25 NOTE — Progress Notes (Signed)
Subjective:    Patient ID: Jeffrey [name], male    DOB: 12-24-1940, 75 y.o.   MRN: [id number]  HPI  [name] is a 75 year old male who presents today with a chief complaint of skin irritation. The irritation is located to his right first digit along the nailbed. He first noticed this irritation yesterday. His wife belives he scraped his thumb on his walker several days before. Overall his thumb is improving, but continues to drain with yellow puss and is inflammed. Denies fevers, chills, fatigue. He does have a history of type 2 diabetes and CVA. He has no sensation to right upper and lower extremities.   Review of Systems  Constitutional: Negative for fever and chills.  Skin: Positive for color change and wound.       Past Medical History  Diagnosis Date  . CAD (coronary artery disease)     PCI distal RCA ...2004, residual 70% LAD   /   ...nuclear...03/2007...no ischemia.Marland KitchenMarland Kitchenpreserved LV /  nuclear...03/03/2009...inferior scar..no ischemia..EF 51%  . Dyslipidemia     takes Atorvastatin daily  . Internal hemorrhoids   . Gout     takes Allopurinol daily and Colchicine as needed  . Seasonal allergic rhinitis   . Right carotid bruit   . Cyst of nasopharynx     per ENT Wilburn Cornelia  . Myocardial infarction [facility]) 26yrs ago  . Peripheral edema     takes Lasix daily  . HTN (hypertension)     takes Cardura,Metoprolol,[name],and Amlodipine daily  . Arthritis   . GERD (gastroesophageal reflux disease)     takes Omeprazole daily  . History of colon polyps   . Type 2 diabetes, uncontrolled, with neuropathy ([place]) 1989    takes Invokana daily and has an insulin pump (Dr. [name])  . 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 HCAP (healthcare-associated pneumonia)   . Hemiparesis affecting right side as late effect of cerebrovascular accident ([place]) 09/05/2014  . Anterior cerebral circulation hemorrhagic infarction [facility]) 11/24/2014    March, 2016,  dominant left thalamic and left internal capsule ischemic infarct with resultant hemorrhagic transformation, secondary to small vessel disease. Resulted in right hemiparesis dysarthria and diplopia   //   readmission with aphasia July, 2016, this improved     Social History   Social History  . Marital Status: Married    Spouse Name: N/A  . Number of Children: 2  . Years of Education: N/A   Occupational History  . [employer]    Social History Main Topics  . Smoking status: Former Research scientist (life sciences)  . Smokeless tobacco: Never Used     Comment: quit smoking 45 years ago.  . Alcohol Use: No  . Drug Use: No  . Sexual Activity: Yes   Other Topics Concern  . Not on file   Social History Narrative   Caffeine: 6-7 cups coffee   Lives with wife, no pets   2 grown children, 4 grandchildren   Occu: Artist   Edu: [employer]   Activity: not much   Diet: plenty of water, fruits and vegetables   Some care through [place]      Endo: Dr. Buddy Duty    Past Surgical History  Procedure Laterality Date  . Cataract extraction Bilateral 2010  . Elbow surgery Right 1997  . Balloon angioplasty, artery  1992, 2004    CAD, Dr. Ron Parker  . Colonoscopy  04/03/2002    adenomatous polyp,  int hemorrhoids  . Colonoscopy  07/18/2007    normal (Dr. Fuller Plan)  . Rotator cuff repair  09/2011    left, with subacromial decompression  . Nasal septum surgery    . Colonoscopy  09/26/2012    tubular adenoma, sm int hem, rpt 5 yrs Fuller Plan)  . Tonsillectomy and adenoidectomy    . Eye lids raised    . Cardiac catheterization  2004  . Polypectomy N/A 05/27/2013    Procedure: ENDOSCOPIC NASOPHARYNGEAL MASS;  Surgeon: [name], MD    Family History  Problem Relation Age of Onset  . Alcohol abuse Father   . Coronary artery disease Neg Hx   . Stroke Neg Hx   . Cancer Neg Hx   . Diabetes Neg Hx   . Colon cancer Neg Hx   . Heart attack Father   . Heart attack Son     Allergies  Allergen Reactions    . Niacin Other (See Comments)    REACTION: intolerant,not allergic="flushing,hot flashes,turning red"    Current Outpatient Prescriptions on File Prior to Visit  Medication Sig Dispense Refill  . atorvastatin (LIPITOR) 80 MG tablet Take 80 mg by mouth at bedtime.    . baclofen (LIORESAL) 10 MG tablet Take 5 mg by mouth 3 (three) times daily as needed for muscle spasms (TAKES AT BREAKFAST, CAN TAKE TWO MORE TIMES AS NEEDED). Daily in am, may have additional dose if needed for muscle spasm    . Cholecalciferol (VITAMIN D3) 5000 UNITS TABS Take 5,000 Units by mouth once a week.    . clopidogrel (PLAVIX) 75 MG tablet Take 1 tablet (75 mg total) by mouth daily. 30 tablet 6  . fosinopril ([name]) 40 MG tablet Take 40 mg by mouth daily.    . furosemide (LASIX) 40 MG tablet Take 40 mg by mouth daily.    . insulin glargine (LANTUS) 100 UNIT/ML injection Inject 18 Units into the skin at bedtime.     . insulin lispro (HUMALOG) 100 UNIT/ML injection Inject 5 Units into the skin 3 (three) times daily before meals.     [name] LACTOBACILLUS PO Take 1 capsule by mouth daily. [facility]    . loratadine (CLARITIN) 10 MG tablet Take 10 mg by mouth daily as needed.     . metoprolol tartrate (LOPRESSOR) 25 MG tablet Take 25 mg by mouth 2 (two) times daily. Hold if B/P < 100/60 or HR< 60    . senna (SENOKOT) 8.6 MG tablet Take 1 tablet by mouth daily as needed. Reported on 02/23/2015    . sertraline (ZOLOFT) 25 MG tablet Take 1 tablet (25 mg total) by mouth daily. 30 tablet 3   No current facility-administered medications on file prior to visit.    BP 134/64 mmHg  Pulse 68  Temp(Src) 97.9 F (36.6 C) (Oral)  Ht 5\' 8"  (1.727 m)  Wt 162 lb 6.4 oz (73.664 kg)  BMI 24.70 kg/m2  SpO2 96%    Objective:   Physical Exam  Constitutional: He appears well-nourished.  Cardiovascular: Normal rate and regular rhythm.   Pulmonary/Chest: Effort normal and breath sounds normal.  Skin: Skin is dry. There is  erythema.  Moderate erythema, no sensation to thumb with palpation, no drainage, appears infectious.           Assessment & Plan:  Cellulitis:  Located to right thumb, along nail bed. Does not represent paronychia. Slightly infectious appearance. Due to inability of sensation from CVA and diabetic history, will treat with  antibiotics. RX for cephalexin sent to pharmacy.  Continue to apply neosporin, bandaid for protection. Follow up if increased redness, swelling, no healing.

## 2015-03-25 NOTE — Patient Instructions (Signed)
Start Cephalexin antibiotics. Take 1 capsule by mouth twice daily for 5 days.  Continue to keep thumb clean and dry. Continue to apply antibiotic ointment 2 times daily.  Please call me if no improvement in 3-4 days.  It was a pleasure meeting you!

## 2015-03-25 NOTE — Therapy (Signed)
[facility] [address] [place], [place], [address] Phone: [phone]   Fax:  [phone]  Physical Therapy Treatment  Patient Details  Name: Jeffrey [name] MRN: [id number] Date of Birth: 04-Dec-1940 Referring Provider: Leonie Man  Encounter Date: 03/24/2015      PT End of Session - 03/25/15 0730    Visit Number 20   Number of Visits 25   Date for PT Re-Evaluation 04/09/15   Authorization Type Blue Medicare HMO-G- code every 10th visit   PT Start Time 1532   PT Stop Time 1620   PT Time Calculation (min) 48 min   Equipment Utilized During Treatment Gait belt   Activity Tolerance Patient tolerated treatment well   Behavior During Therapy [facility] for tasks assessed/performed      Past Medical History  Diagnosis Date  . CAD (coronary artery disease)     PCI distal RCA ...2004, residual 70% LAD   /   ...nuclear...03/2007...no ischemia.Marland KitchenMarland Kitchenpreserved LV /  nuclear...03/03/2009...inferior scar..no ischemia..EF 51%  . Dyslipidemia     takes Atorvastatin daily  . Internal hemorrhoids   . Gout     takes Allopurinol daily and Colchicine as needed  . Seasonal allergic rhinitis   . Right carotid bruit   . Cyst of nasopharynx     per ENT Wilburn Cornelia  . Myocardial infarction [facility]) 47yr ago  . Peripheral edema     takes Lasix daily  . HTN (hypertension)     takes Cardura,Metoprolol,[name],and Amlodipine daily  . Arthritis   . GERD (gastroesophageal reflux disease)     takes Omeprazole daily  . History of colon polyps   . Type 2 diabetes, uncontrolled, with neuropathy ([place] 1989    takes Invokana daily and has an insulin pump ([name]  . 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 HCAP (healthcare-associated pneumonia)   . Hemiparesis affecting right side as late effect of cerebrovascular accident ([place] 09/05/2014  . Anterior cerebral circulation hemorrhagic infarction ([facility]  11/24/2014    March, 2016, dominant left thalamic and left internal capsule ischemic infarct with resultant hemorrhagic transformation, secondary to small vessel disease. Resulted in right hemiparesis dysarthria and diplopia   //   readmission with aphasia July, 2016, this improved     Past Surgical History  Procedure Laterality Date  . Cataract extraction Bilateral 2010  . Elbow surgery Right 1997  . Balloon angioplasty, artery  1992, 2004    CAD, Dr. KRon Parker . Colonoscopy  04/03/2002    adenomatous polyp, int hemorrhoids  . Colonoscopy  07/18/2007    normal (Dr. SFuller Plan  . Rotator cuff repair  09/2011    left, with subacromial decompression  . Nasal septum surgery    . Colonoscopy  09/26/2012    tubular adenoma, sm int hem, rpt 5 yrs (Fuller Plan  . Tonsillectomy and adenoidectomy    . Eye lids raised    . Cardiac catheterization  2004  . Polypectomy N/A 05/27/2013    Procedure: ENDOSCOPIC NASOPHARYNGEAL MASS;  Surgeon: DJerrell Belfast MD    There were no vitals filed for this visit.  Visit Diagnosis:  Abnormality of gait  Unsteadiness  Ataxia  Decreased functional mobility  Decreased coordination      Subjective Assessment - 03/25/15 0729    Subjective "I've been doing better not using the wheelchair at home, but I'm still using it some." "I actually had a fall from the w/c because it caught on a  doorway and flipped me out."    Pertinent History Blood glucose may drop when pt has 2 therapies in a row. Pt's family packs bag of snacks to eat if pt feels hypoglycemic.   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   Currently in Pain? No/denies           Self Care:  Had lengthy discussion regarding increased use of RW at home, again educated on purchase of RW bag if able.  Pt states that he had a fall from w/c as he  as wheeling into next room and back of chair got caught on doorway and flipped him backwards.  No injury reported, however do note that per wife and pt report due to wear down of padded area of RW hand splint, pt with skin breakdown and wound on R lateral aspect of thumb.  Had another PT assist in assessing wound and feel that it may be infected and to therefore have assessed by PCP.  Pt and wife verbalized understanding.  Applied new padding and secured HO during session.   NMR:  Focused on standing balance with decreasing point of stability during session.  Initially had pt stand with both feet on ground at counter top while utilized LUE to stabilize RUE to place cones inside of cabinet, however felt that cabinet too elevated and causing increased compensatory movement at shoulder, therefore moved target lower for placing cones.  Also added small 4" step under LLE to increase R lateral weight shift and WB while performing task.  Provided facilitation at trunk/abdomen for upright posture and increased activation of core due to pt wanting to "sit back" onto bony or ligamentous structures to maintain support and balance.  Pt tolerated well with cues for breathing throughout.                        PT Education - 03/25/15 0730    Education provided Yes   Education Details Continue to encourage as much RW usage around the home for increased independence.    Person(s) Educated Patient;Spouse   Methods Explanation   Comprehension Verbalized understanding          PT Short Term Goals - 02/03/15 1324    PT SHORT TERM GOAL #1   Title Pt will perform HEP with family assistance/supervision, for lower extremitiy strengthening.  TARGET 02/06/15   Time 4   Period Weeks   Status Achieved   PT SHORT TERM GOAL #2   Title Pt will perform sit<>stand transfers with supervision, for improved efficiency and safety with transfers.   Baseline Met 11/22.   Time 4   Period Weeks   Status Achieved    PT SHORT TERM GOAL #3   Title Pt will improve TUG score to less than or equal to 70 seconds for decreased fall risk.   Baseline 52.62 seconds with R AFO and heel wedge;73 seconds without AFO   Time 4   Period Weeks   Status Achieved   PT SHORT TERM GOAL #4   Title Pt will ambulate at least 150 ft using least restrictive assistive device with minimal assistance for improved gait efficiency.   Baseline Met 11/22.   Time 4   Period Weeks   Status Achieved   PT SHORT TERM GOAL #5  Title Pt will verbalize understanding of CVA education.   Time 4   Period Weeks   Status Achieved           PT Long Term Goals - 03/10/15 1413    PT LONG TERM GOAL #1   Title Pt will verbalize understanding of fall prevention within home environment.  Modified TARGET 04/07/15   Baseline Requires mod cues to recall on 03/10/15   Time 4   Period Weeks   Status On-going   PT LONG TERM GOAL #2   Title Pt will improve Timed Up and GO score to < or equal to 24.95 seconds for decreased fall risk.   Baseline 12/29: TUG = 27.95 sec   Time 4   Period Weeks   Status Revised   PT LONG TERM GOAL #3   Title Pt will improve gait velocity to 1.94 ft/sec for improved gait efficiency and safety.   Baseline 12/29: gait velocity = 1.34 ft/sec   Time 4   Period Weeks   Status Revised   PT LONG TERM GOAL #4   Title Pt will improve BERG balance score to 38/56 in order to indicate decreased fall risk.    Baseline 21/56 (01/12/15) to 34/56 on 03/10/15   Time 4   Period Weeks   Status Revised   PT LONG TERM GOAL #5   Title Pt will ambulate >300' using least restrictive assistive device at mod I level for improved efficiency and safety with gait.   Time 4   Period --   Status Revised   PT LONG TERM GOAL #6   Title Pt will verbalize plans for continued community fitness upon D/C from PT.   Time 4   Period Weeks   Status On-going               Plan - 03/25/15 0731    Clinical Impression Statement Skilled  session continues to focus on education and encouragement to decrease use of w/c all together, discussion regarding fall, new wound to R thumb from hand orthosis, and standing balance with emphasis on improved trunk control.     Pt will benefit from skilled therapeutic intervention in order to improve on the following deficits Abnormal gait;Decreased balance;Decreased mobility;Decreased coordination;Decreased strength;Difficulty walking;Impaired sensation;Impaired tone   Rehab Potential Good   PT Frequency 2x / week   PT Duration 4 weeks   PT Treatment/Interventions ADLs/Self Care Home Management;Therapeutic exercise;Therapeutic activities;Functional mobility training;Gait training;Balance training;Neuromuscular re-education;Electrical Stimulation   PT Next Visit Plan Continue to address RLE WB, NMR and coordination, gait w/ SPC, consider adding small controlled movements with single "anchor" so that he has to increase trunk control (RUE with LUE, etc), increasing proximal control   Consulted and Agree with Plan of Care Patient   Family Member Consulted wife        Problem List Patient Active Problem List   Diagnosis Date Noted  . Ear pain 03/23/2015  . Anterior cerebral circulation hemorrhagic infarction ([place]) 11/24/2014  . Hemiparesis affecting right side as late effect of cerebrovascular accident ([place]) 09/05/2014  . Depression due to stroke ([place]) 09/05/2014  . Thrombocytopenia ([place]) 06/05/2014  . Chronic diastolic heart failure, NYHA class 1 ([place]) 06/05/2014  . Ex-smoker 06/03/2014  . Benign paroxysmal positional vertigo 04/28/2014  . Right carotid bruit   . Polyneuropathy, diabetic ([place]) 08/05/2012  . Seasonal allergic rhinitis   . Trigger thumb of left hand 10/12/2011  . Gout   . Adenomatous polyps   . CAD (coronary  artery disease)   . Essential hypertension   . Hyperlipidemia   . Type 2 diabetes, uncontrolled, with neuropathy ([place])   . Ejection fraction     Cameron Sprang,  PT, MPT [facility] [address] [place], [place], [address] Phone: [phone]   Fax:  [phone] 03/25/2015, 7:36 AM  Name: RYLEN SWINDLER MRN: [id number] Date of Birth: Jan 31, 1941

## 2015-03-26 ENCOUNTER — Encounter: Payer: Medicare Other | Admitting: Occupational Therapy

## 2015-03-26 ENCOUNTER — Ambulatory Visit: Payer: Medicare Other | Admitting: Rehabilitation

## 2015-03-30 ENCOUNTER — Encounter: Payer: Self-pay | Admitting: Rehabilitation

## 2015-03-30 ENCOUNTER — Ambulatory Visit: Payer: PPO | Admitting: Rehabilitation

## 2015-03-30 ENCOUNTER — Ambulatory Visit: Payer: PPO | Admitting: Occupational Therapy

## 2015-03-30 DIAGNOSIS — R270 Ataxia, unspecified: Secondary | ICD-10-CM

## 2015-03-30 DIAGNOSIS — R269 Unspecified abnormalities of gait and mobility: Secondary | ICD-10-CM | POA: Diagnosis not present

## 2015-03-30 DIAGNOSIS — R279 Unspecified lack of coordination: Principal | ICD-10-CM

## 2015-03-30 DIAGNOSIS — R278 Other lack of coordination: Secondary | ICD-10-CM

## 2015-03-30 DIAGNOSIS — R2681 Unsteadiness on feet: Secondary | ICD-10-CM

## 2015-03-30 DIAGNOSIS — G8191 Hemiplegia, unspecified affecting right dominant side: Secondary | ICD-10-CM

## 2015-03-30 DIAGNOSIS — M25611 Stiffness of right shoulder, not elsewhere classified: Secondary | ICD-10-CM

## 2015-03-30 DIAGNOSIS — R2689 Other abnormalities of gait and mobility: Secondary | ICD-10-CM

## 2015-03-30 NOTE — Therapy (Signed)
[facility] [address] [place], [place], [address] Phone: [phone]   Fax:  [phone]  Physical Therapy Treatment  Patient Details  Name: Jeffrey [name] MRN: [id number] Date of Birth: [birth year] Referring Provider: Leonie Man  Encounter Date: 03/30/2015      PT End of Session - 03/30/15 1106    Visit Number 21   Number of Visits 25   Date for PT Re-Evaluation 04/09/15   Authorization Type Blue Medicare HMO-G- code every 10th visit   PT Start Time 1102   PT Stop Time 1147   PT Time Calculation (min) 45 min   Equipment Utilized During Treatment Gait belt   Activity Tolerance Patient tolerated treatment well   Behavior During Therapy [facility] for tasks assessed/performed      Past Medical History  Diagnosis Date  . CAD (coronary artery disease)     PCI distal RCA ...2004, residual 70% LAD   /   ...nuclear...03/2007...no ischemia.Marland KitchenMarland Kitchenpreserved LV /  nuclear...03/03/2009...inferior scar..no ischemia..EF 51%  . Dyslipidemia     takes Atorvastatin daily  . Internal hemorrhoids   . Gout     takes Allopurinol daily and Colchicine as needed  . Seasonal allergic rhinitis   . Right carotid bruit   . Cyst of nasopharynx     per ENT Wilburn Cornelia  . Myocardial infarction [facility]) 23yr ago  . Peripheral edema     takes Lasix daily  . HTN (hypertension)     takes Cardura,Metoprolol,[name],and Amlodipine daily  . Arthritis   . GERD (gastroesophageal reflux disease)     takes Omeprazole daily  . History of colon polyps   . Type 2 diabetes, uncontrolled, with neuropathy ([place] 1989    takes Invokana daily and has an insulin pump ([name]  . 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 HCAP (healthcare-associated pneumonia)   . Hemiparesis affecting right side as late effect of [place]vascular accident ([place] 09/05/2014  . Anterior cerebral circulation hemorrhagic infarction ([facility]  11/24/2014    March, 2016, dominant left thalamic and left internal capsule ischemic infarct with resultant hemorrhagic transformation, secondary to small vessel disease. Resulted in right hemiparesis dysarthria and diplopia   //   readmission with aphasia July, 2016, this improved     Past Surgical History  Procedure Laterality Date  . Cataract extraction Bilateral 2010  . Elbow surgery Right 1997  . Balloon angioplasty, artery  1992, 2004    CAD, Dr. KRon Parker . Colonoscopy  04/03/2002    adenomatous polyp, int hemorrhoids  . Colonoscopy  07/18/2007    normal (Dr. SFuller Plan  . Rotator cuff repair  09/2011    left, with subacromial decompression  . Nasal septum surgery    . Colonoscopy  09/26/2012    tubular adenoma, sm int hem, rpt 5 yrs (Fuller Plan  . Tonsillectomy and adenoidectomy    . Eye lids raised    . Cardiac catheterization  2004  . Polypectomy N/A 05/27/2013    Procedure: ENDOSCOPIC NASOPHARYNGEAL MASS;  Surgeon: DJerrell Belfast MD    There were no vitals filed for this visit.  Visit Diagnosis:  Abnormality of gait  Unsteadiness  Ataxia  Decreased functional mobility      Subjective Assessment - 03/30/15 1105    Subjective "I'm using the walker about 90% of the time."    Pertinent History Blood glucose may drop when pt has 2 therapies in a row. Pt's family packs bag of snacks  to eat if pt feels hypoglycemic.   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   Currently in Pain? No/denies          NMR:  Continue to address activation and stabilization with core musculature during session in seated and standing while performing self assisted task with RUE.  Began session seated on physioball with table in front of pt working on stacking cups.  Provided mod facilitation at trunk to remain in anterior pelvic tilt and  upright posture throughout as pt tends to sink forward and rely on back of LEs to increase stabilization.  Cues for slower speed of movement when utilizing RUE and to use increased visual feedback when placing stacked cups.  Note pt with increased use of LLE during task, therefore transitioned to LLE being placed on 6" block and performing more side to side movement for increased trunk control.  Again provided heavy facilitation to remain as upright as possible and perform forward weight shift at hips.  Ended on physioball with increased fine motor task to increase challenge while placing shower rings on small cones.  Max cues needed for slower controlled movements and using vision to compensate for decreased proprioception/sensation.  Progressed to standing at counter top with LLE on 6" block while performing anterior/posterior weight shifts to place rings on cone.  Facilitation at trunk/abdomen to prevent pt from "sitting" into bony or ligamentous structures for support.  Pt tolerated well, mod c/o fatigue, therefore allowed seated rest break.                         PT Education - 03/30/15 1106    Education provided --   [employer]) Educated --   Methods --   Comprehension --          PT Short Term Goals - 02/03/15 1324    PT SHORT TERM GOAL #1   Title Pt will perform HEP with family assistance/supervision, for lower extremitiy strengthening.  TARGET 02/06/15   Time 4   Period Weeks   Status Achieved   PT SHORT TERM GOAL #2   Title Pt will perform sit<>stand transfers with supervision, for improved efficiency and safety with transfers.   Baseline Met 11/22.   Time 4   Period Weeks   Status Achieved   PT SHORT TERM GOAL #3   Title Pt will improve TUG score to less than or equal to 70 seconds for decreased fall risk.   Baseline 52.62 seconds with R AFO and heel wedge;73 seconds without AFO   Time 4   Period Weeks   Status Achieved   PT SHORT TERM GOAL #4   Title Pt  will ambulate at least 150 ft using least restrictive assistive device with minimal assistance for improved gait efficiency.   Baseline Met 11/22.   Time 4   Period Weeks   Status Achieved   PT SHORT TERM GOAL #5   Title Pt will verbalize understanding of CVA education.   Time 4   Period Weeks   Status Achieved           PT Long Term Goals - 03/10/15 1413    PT LONG TERM GOAL #1   Title Pt will verbalize understanding of fall prevention within home environment.  Modified TARGET 04/07/15   Baseline  Requires mod cues to recall on 03/10/15   Time 4   Period Weeks   Status On-going   PT LONG TERM GOAL #2   Title Pt will improve Timed Up and GO score to < or equal to 24.95 seconds for decreased fall risk.   Baseline 12/29: TUG = 27.95 sec   Time 4   Period Weeks   Status Revised   PT LONG TERM GOAL #3   Title Pt will improve gait velocity to 1.94 ft/sec for improved gait efficiency and safety.   Baseline 12/29: gait velocity = 1.34 ft/sec   Time 4   Period Weeks   Status Revised   PT LONG TERM GOAL #4   Title Pt will improve BERG balance score to 38/56 in order to indicate decreased fall risk.    Baseline 21/56 (01/12/15) to 34/56 on 03/10/15   Time 4   Period Weeks   Status Revised   PT LONG TERM GOAL #5   Title Pt will ambulate >300' using least restrictive assistive device at mod I level for improved efficiency and safety with gait.   Time 4   Period --   Status Revised   PT LONG TERM GOAL #6   Title Pt will verbalize plans for continued community fitness upon D/C from PT.   Time 4   Period Weeks   Status On-going               Plan - 03/30/15 1107    Clinical Impression Statement Skilled session focused on increased activation of core muscles during seated and standing activity while performing self assisted task with RUE.  Tolerated well with increased fatigue noted at end of session.    Pt will benefit from skilled therapeutic intervention in order to improve  on the following deficits Abnormal gait;Decreased balance;Decreased mobility;Decreased coordination;Decreased strength;Difficulty walking;Impaired sensation;Impaired tone   Rehab Potential Good   PT Frequency 2x / week   PT Duration 4 weeks   PT Treatment/Interventions ADLs/Self Care Home Management;Therapeutic exercise;Therapeutic activities;Functional mobility training;Gait training;Balance training;Neuromuscular re-education;Electrical Stimulation   PT Next Visit Plan Continue to address RLE WB, NMR and coordination, gait w/ SPC, consider adding small controlled movements with single "anchor" so that he has to increase trunk control (RUE with LUE, etc), increasing proximal control   Consulted and Agree with Plan of Care Patient   Family Member Consulted --        Problem List Patient Active Problem List   Diagnosis Date Noted  . Ear pain 03/23/2015  . Anterior cerebral circulation hemorrhagic infarction ([place]) 11/24/2014  . Hemiparesis affecting right side as late effect of cerebrovascular accident ([place]) 09/05/2014  . Depression due to stroke ([place]) 09/05/2014  . Thrombocytopenia ([place]) 06/05/2014  . Chronic diastolic heart failure, NYHA class 1 ([place]) 06/05/2014  . Ex-smoker 06/03/2014  . Benign paroxysmal positional vertigo 04/28/2014  . Right carotid bruit   . Polyneuropathy, diabetic ([place]) 08/05/2012  . Seasonal allergic rhinitis   . Trigger thumb of left hand 10/12/2011  . Gout   . Adenomatous polyps   . CAD (coronary artery disease)   . Essential hypertension   . Hyperlipidemia   . Type 2 diabetes, uncontrolled, with neuropathy ([place])   . Ejection fraction     Cameron Sprang, PT, MPT [facility] [address] [place], [place], [address] Phone: [phone]   Fax:  [phone] 03/30/2015, 3:17 PM  Name: Jeffrey [name] MRN: [id number] Date of Birth: [birth year]

## 2015-03-30 NOTE — Therapy (Signed)
[facility] [address] [place], [place], [address] Phone: [phone]   Fax:  [phone]  Occupational Therapy Treatment  Patient Details  Name: Jeffrey [name] MRN: [id number] Date of Birth: 05/28/1940 Referring Provider: Dr. Leonie Man  Encounter Date: 03/30/2015      OT End of Session - 03/30/15 1048    Visit Number 20   Number of Visits 32   Date for OT Re-Evaluation 05/09/15   Authorization Type [employer], no visit limit/no auth, G-code   Authorization Time Period renewal completed 03/13/77, cert period 05/13/01-0/0/92,ZR 3/8   Authorization - Visit Number 34   Authorization - Number of Visits 20   OT Start Time 1021   OT Stop Time 1100   OT Time Calculation (min) 39 min   Activity Tolerance Patient tolerated treatment well   Behavior During Therapy [facility] for tasks assessed/performed      Past Medical History  Diagnosis Date  . CAD (coronary artery disease)     PCI distal RCA ...2004, residual 70% LAD   /   ...nuclear...03/2007...no ischemia.Marland KitchenMarland Kitchenpreserved LV /  nuclear...03/03/2009...inferior scar..no ischemia..EF 51%  . Dyslipidemia     takes Atorvastatin daily  . Internal hemorrhoids   . Gout     takes Allopurinol daily and Colchicine as needed  . Seasonal allergic rhinitis   . Right carotid bruit   . Cyst of nasopharynx     per ENT Wilburn Cornelia  . Myocardial infarction [facility]) 41yr ago  . Peripheral edema     takes Lasix daily  . HTN (hypertension)     takes Cardura,Metoprolol,[name],and Amlodipine daily  . Arthritis   . GERD (gastroesophageal reflux disease)     takes Omeprazole daily  . History of colon polyps   . Type 2 diabetes, uncontrolled, with neuropathy ([place] 1989    takes Invokana daily and has an insulin pump ([name]  . 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 HCAP (healthcare-associated pneumonia)   . Hemiparesis affecting right  side as late effect of cerebrovascular accident ([place] 09/05/2014  . Anterior cerebral circulation hemorrhagic infarction ([facility] 11/24/2014    March, 2016, dominant left thalamic and left internal capsule ischemic infarct with resultant hemorrhagic transformation, secondary to small vessel disease. Resulted in right hemiparesis dysarthria and diplopia   //   readmission with aphasia July, 2016, this improved     Past Surgical History  Procedure Laterality Date  . Cataract extraction Bilateral 2010  . Elbow surgery Right 1997  . Balloon angioplasty, artery  1992, 2004    CAD, Dr. KRon Parker . Colonoscopy  04/03/2002    adenomatous polyp, int hemorrhoids  . Colonoscopy  07/18/2007    normal (Dr. SFuller Plan  . Rotator cuff repair  09/2011    left, with subacromial decompression  . Nasal septum surgery    . Colonoscopy  09/26/2012    tubular adenoma, sm int hem, rpt 5 yrs (Fuller Plan  . Tonsillectomy and adenoidectomy    . Eye lids raised    . Cardiac catheterization  2004  . Polypectomy N/A 05/27/2013    Procedure: ENDOSCOPIC NASOPHARYNGEAL MASS;  Surgeon: DJerrell Belfast MD    There were no vitals filed for this visit.  Visit Diagnosis:  Decreased coordination  Stiffness of right shoulder joint  Right hemiparesis ([place]  Ataxia      Subjective Assessment - 03/30/15 1705    Subjective  Pt reports that he is using RUE  a little more as a helper for some things.  Pt reports that he was able to participate in short speaking engagement.   Pertinent History see epic   Patient Stated Goals utilization of right arm "I wish I had more control of it", feel comfortable being around people   Currently in Pain? No/denies                      OT Treatments/Exercises (OP) - 03/30/15 0001    Neurological Re-education Exercises   Shoulder Flexion AAROM;Both;Supine  PVC frame   Elbow Extension AAROM;Right;Supine  with PVC frame   Other Grasp and Release Exercises  In sitting, low range  functional reaching to grasp/release cylinder objects with min facilitation for control. Pt with min cues for compensation patterns/shoulder hike.  Then with BUEs holding cylinder object, controlled reach in low-mid range to targets with focus on control and avoiding shoulder hike with min cueing.  (Pt improved with repetition).   Seated with weight on hand with arm on body movements for incr UE stability/control with min cues   Other Weight-Bearing Exercises 2 light wt. bearing/AAROM elbow ext with tilted stool with min cueing for control and to avoid R shoulder hike                  OT Short Term Goals - 03/30/15 1714    OT SHORT TERM GOAL #1   Title Pt will be independent with initial HEP.--check STGs 02/10/15   Time 4   Period Weeks   Status Achieved  03/10/15   OT SHORT TERM GOAL #2   Title Pt will use RUE as gross A/stabilizer at least 25% of the time for ADLs.   Time 4   Period Weeks   Status Achieved  02/23/15     OT SHORT TERM GOAL #3   Title Pt will demo at least 55* R shoulder flex with min compensation in prep for functional reach.   Baseline 45*   Time 4   Period Weeks   Status On-going  03/10/15:  50* with min-mod compensation/cueing   OT SHORT TERM GOAL #4   Title Pt will be able to complete clothing fasteners (buttons, tying shoes) mod I using AE prn.   Time 4   Period Weeks   Status Achieved  02/23/15   OT SHORT TERM GOAL #5   Title Pt will be able to grasp/release cylinder object independently using good control in 7/10 attempts without drops.--check 04/11/15   Time 4   Period Weeks   Status On-going           OT Long Term Goals - 03/24/15 1716    OT LONG TERM GOAL #1   Title Pt will be independent with updated HEP.--check LTGs 05/09/15   Time 8   Period Weeks   Status On-going  03/12/15:  not fully met/ongoing--continue for renewal period   OT LONG TERM GOAL #2   Title Pt will use RUE as gross A/stabilizer for ADLs at least 50% of the time.   Time  8   Period Weeks   Status Achieved  03/10/15   OT LONG TERM GOAL #3   Title Pt will demo at least 60* R shoulder flex with min compensation in prep for functional reach.   Baseline 45*   Time 8   Period Weeks   Status Revised  03/10/15:  50* with min-mod compensation/cueing; revised 03/12/15   OT LONG TERM GOAL #4  Title Pt will improve RUE functional reaching/coordination for ADLs as shown by improving score on box and blocks test to at least 15 blocks.--continue for renewal, check 05/09/15   Baseline 9 blocks   Time 8   Period Weeks   Status On-going  03/12/15:  10 blocks   OT LONG TERM GOAL #5   Title Pt will perform bathing with supervision.   Time 8   Period Weeks   Status Achieved   OT LONG TERM GOAL #6   Title Pt will verbalize understanding of cognitive/visual compensation strategies and activities to improve cognition/vision prn.   Time 8   Period Weeks   Status Achieved  03/12/15   OT LONG TERM GOAL #7   Title Pt will be able to grasp/release cylinder object independently using good control in 9/10 attempts without drops.--check 04/11/15   Baseline 8   Period Weeks   Status New   OT LONG TERM GOAL #8   Title Pt will be able to write name with RUE with at least 75% legiblity using AE prn.   Time 8   Period Weeks   Status New               Plan - April 01, 2015 1711    Clinical Impression Statement Pt continues to make slow progress towards goals.  Pt demo decr shoulder hike/compensation and demo improved RUE control in low ranges with strategies.  Pt would benefit from continued occupational therapy to improve dominant RUE functional use and quality of life.     Pt will benefit from skilled therapeutic intervention in order to improve on the following deficits (Retired) Decreased cognition;Decreased mobility;Decreased strength;Impaired vision/preception;Impaired UE functional use;Decreased knowledge of use of DME;Decreased balance;Impaired tone;Impaired sensation;Decreased  coordination;Decreased range of motion   Rehab Potential Good   Clinical Impairments Affecting Rehab Potential severity of deficits, sensory deficits, ataxia   OT Frequency 2x / week   OT Duration 8 weeks   OT Treatment/Interventions Self-care/ADL training;Therapeutic exercise;Functional Mobility Training;Patient/family education;Ultrasound;Neuromuscular education;Manual Therapy;Splinting;Therapeutic exercises;Cryotherapy;Parrafin;DME and/or AE instruction;Therapeutic activities;Cognitive remediation/compensation;Visual/perceptual remediation/compensation;Passive range of motion;Moist Heat;Contrast Bath;Fluidtherapy;Electrical Stimulation   Plan continue with RUE functional use, neuro re-ed   OT Home Exercise Plan Education provided:  initial HEP issued; 03/03/15 memory compensations, cognitive/visual HEP, coordination HEP issued   Consulted and Agree with Plan of Care Patient          G-Codes - Apr 01, 2015 1716    Functional Assessment Tool Used using RUE as a stabilizer, box and blocks=10   Functional Limitation Carrying, moving and handling objects   Carrying, Moving and Handling Objects Current Status (J3354) At least 60 percent but less than 80 percent impaired, limited or restricted   Carrying, Moving and Handling Objects Goal Status (T6256) At least 40 percent but less than 60 percent impaired, limited or restricted       Occupational Therapy Progress Note  Dates of Reporting Period: 02/19/15 to Apr 01, 2015  Objective Reports of Subjective Statement: see above  Objective Measurements: see above  Goal Update: see above  Plan: see above  Reason Skilled Services are Required: see above   Problem List Patient Active Problem List   Diagnosis Date Noted  . Ear pain 03/23/2015  . Anterior cerebral circulation hemorrhagic infarction ([place]) 11/24/2014  . Hemiparesis affecting right side as late effect of cerebrovascular accident ([place]) 09/05/2014  . Depression due to stroke ([place])  09/05/2014  . Thrombocytopenia ([place]) 06/05/2014  . Chronic diastolic heart failure, NYHA class 1 ([place]) 06/05/2014  . Ex-smoker 06/03/2014  .  Benign paroxysmal positional vertigo 04/28/2014  . Right carotid bruit   . Polyneuropathy, diabetic ([place]) 08/05/2012  . Seasonal allergic rhinitis   . Trigger thumb of left hand 10/12/2011  . Gout   . Adenomatous polyps   . CAD (coronary artery disease)   . Essential hypertension   . Hyperlipidemia   . Type 2 diabetes, uncontrolled, with neuropathy ([place])   . Ejection fraction     [facility] 03/30/2015, 5:17 PM  [facility] [address] [place], [place], [address] Phone: [phone]   Fax:  [phone]  Name: Jeffrey [name] MRN: [id number] Date of Birth: September 03, 1940  Vianne Bulls, OTR/L 03/30/2015 5:17 PM

## 2015-03-31 DIAGNOSIS — R279 Unspecified lack of coordination: Secondary | ICD-10-CM | POA: Diagnosis not present

## 2015-03-31 DIAGNOSIS — M6281 Muscle weakness (generalized): Secondary | ICD-10-CM | POA: Diagnosis not present

## 2015-03-31 DIAGNOSIS — R261 Paralytic gait: Secondary | ICD-10-CM | POA: Diagnosis not present

## 2015-03-31 DIAGNOSIS — I69322 Dysarthria following cerebral infarction: Secondary | ICD-10-CM | POA: Diagnosis not present

## 2015-03-31 DIAGNOSIS — I639 Cerebral infarction, unspecified: Secondary | ICD-10-CM | POA: Diagnosis not present

## 2015-03-31 DIAGNOSIS — R600 Localized edema: Secondary | ICD-10-CM | POA: Diagnosis not present

## 2015-03-31 DIAGNOSIS — R278 Other lack of coordination: Secondary | ICD-10-CM | POA: Diagnosis not present

## 2015-04-02 ENCOUNTER — Encounter: Payer: Self-pay | Admitting: Rehabilitation

## 2015-04-02 ENCOUNTER — Encounter: Payer: Self-pay | Admitting: Occupational Therapy

## 2015-04-02 ENCOUNTER — Ambulatory Visit: Payer: PPO | Admitting: Rehabilitation

## 2015-04-02 ENCOUNTER — Ambulatory Visit: Payer: PPO | Admitting: Occupational Therapy

## 2015-04-02 DIAGNOSIS — G8191 Hemiplegia, unspecified affecting right dominant side: Secondary | ICD-10-CM

## 2015-04-02 DIAGNOSIS — H539 Unspecified visual disturbance: Secondary | ICD-10-CM

## 2015-04-02 DIAGNOSIS — R269 Unspecified abnormalities of gait and mobility: Secondary | ICD-10-CM | POA: Diagnosis not present

## 2015-04-02 DIAGNOSIS — I69319 Unspecified symptoms and signs involving cognitive functions following cerebral infarction: Secondary | ICD-10-CM

## 2015-04-02 DIAGNOSIS — R278 Other lack of coordination: Secondary | ICD-10-CM

## 2015-04-02 DIAGNOSIS — R279 Unspecified lack of coordination: Secondary | ICD-10-CM

## 2015-04-02 DIAGNOSIS — R2681 Unsteadiness on feet: Secondary | ICD-10-CM

## 2015-04-02 DIAGNOSIS — M25611 Stiffness of right shoulder, not elsewhere classified: Secondary | ICD-10-CM

## 2015-04-02 DIAGNOSIS — R270 Ataxia, unspecified: Secondary | ICD-10-CM

## 2015-04-02 DIAGNOSIS — R2689 Other abnormalities of gait and mobility: Secondary | ICD-10-CM

## 2015-04-02 NOTE — Therapy (Signed)
[facility] [address] [place], [place], [address] Phone: [phone]   Fax:  [phone]  Occupational Therapy Treatment  Patient Details  Name: Jeffrey [name] MRN: [id number] Date of Birth: [birth year] Referring Provider: Dr. Leonie Man  Encounter Date: 04/02/2015      OT End of Session - 04/02/15 1204    Visit Number 21   Number of Visits 32   Date for OT Re-Evaluation 05/09/15   Authorization Time Period renewal completed 08/09/38, cert period 05/08/72-04/11/93,GL 3/8   Authorization - Visit Number 21   Authorization - Number of Visits 30   OT Start Time 1100   OT Stop Time 1144   OT Time Calculation (min) 44 min   Activity Tolerance Patient tolerated treatment well      Past Medical History  Diagnosis Date  . CAD (coronary artery disease)     PCI distal RCA ...2004, residual 70% LAD   /   ...nuclear...03/2007...no ischemia.Marland KitchenMarland Kitchenpreserved LV /  nuclear...03/03/2009...inferior scar..no ischemia..EF 51%  . Dyslipidemia     takes Atorvastatin daily  . Internal hemorrhoids   . Gout     takes Allopurinol daily and Colchicine as needed  . Seasonal allergic rhinitis   . Right carotid bruit   . Cyst of nasopharynx     per ENT Wilburn Cornelia  . Myocardial infarction [facility]) 4yr ago  . Peripheral edema     takes Lasix daily  . HTN (hypertension)     takes Cardura,Metoprolol,[name],and Amlodipine daily  . Arthritis   . GERD (gastroesophageal reflux disease)     takes Omeprazole daily  . History of colon polyps   . Type 2 diabetes, uncontrolled, with neuropathy ([place] 1989    takes Invokana daily and has an insulin pump ([name]  . 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 HCAP (healthcare-associated pneumonia)   . Hemiparesis affecting right side as late effect of cerebrovascular accident ([place] 09/05/2014  . Anterior cerebral circulation hemorrhagic infarction ([facility] 11/24/2014     March, 2016, dominant left thalamic and left internal capsule ischemic infarct with resultant hemorrhagic transformation, secondary to small vessel disease. Resulted in right hemiparesis dysarthria and diplopia   //   readmission with aphasia July, 2016, this improved     Past Surgical History  Procedure Laterality Date  . Cataract extraction Bilateral 2010  . Elbow surgery Right 1997  . Balloon angioplasty, artery  1992, 2004    CAD, Dr. KRon Parker . Colonoscopy  04/03/2002    adenomatous polyp, int hemorrhoids  . Colonoscopy  07/18/2007    normal (Dr. SFuller Plan  . Rotator cuff repair  09/2011    left, with subacromial decompression  . Nasal septum surgery    . Colonoscopy  09/26/2012    tubular adenoma, sm int hem, rpt 5 yrs (Fuller Plan  . Tonsillectomy and adenoidectomy    . Eye lids raised    . Cardiac catheterization  2004  . Polypectomy N/A 05/27/2013    Procedure: ENDOSCOPIC NASOPHARYNGEAL MASS;  Surgeon: DJerrell Belfast MD    There were no vitals filed for this visit.  Visit Diagnosis:  Right hemiparesis ([place]  Stiffness of right shoulder joint  Decreased coordination  Visual disturbance  Cognitive deficits following cerebral infarction      Subjective Assessment - 04/02/15 1105    Subjective  This break in the weather has lifted my spirits   Pertinent History see epic   Patient Stated Goals utilization  of right arm "I wish I had more control of it", feel comfortable being around people   Currently in Pain? No/denies                      OT Treatments/Exercises (OP) - 04/02/15 0001    ADLs   Writing Practiced pre writing skills with both weighted pen and built up marker. Pt with maximal difficulty.  Cued to keep RUE on table and to try and keep marker in contact with paper, moving paper vs moving arm, slowing down. Pt undestandably frutstrated.  Discused practicing in clinic more prior to working at home on this activity. Pt in agreement.    Neurological  Re-education Exercises   Other Exercises 1 Neuro re ed to address bilateral mid to beginning high reach with weighted objects. Pt able to reach to 110* of shoulder flexion repeatively with min compensations - pt able to reduce compensations with only vc's.  Progressed to low to beginning mid reach unilateral with RUE with mod compensations. Pt imroved with very light touch to provide proprioceptive input.                    OT Short Term Goals - 04/02/15 1202    OT SHORT TERM GOAL #1   Title Pt will be independent with initial HEP.--check STGs 02/10/15   Time 4   Period Weeks   Status Achieved  03/10/15   OT SHORT TERM GOAL #2   Title Pt will use RUE as gross A/stabilizer at least 25% of the time for ADLs.   Time 4   Period Weeks   Status Achieved  02/23/15     OT SHORT TERM GOAL #3   Title Pt will demo at least 55* R shoulder flex with min compensation in prep for functional reach.   Baseline 45*   Time 4   Period Weeks   Status On-going  03/10/15:  50* with min-mod compensation/cueing   OT SHORT TERM GOAL #4   Title Pt will be able to complete clothing fasteners (buttons, tying shoes) mod I using AE prn.   Time 4   Period Weeks   Status Achieved  02/23/15   OT SHORT TERM GOAL #5   Title Pt will be able to grasp/release cylinder object independently using good control in 7/10 attempts without drops.--check 04/11/15   Time 4   Period Weeks   Status On-going           OT Long Term Goals - 04/02/15 1202    OT LONG TERM GOAL #1   Title Pt will be independent with updated HEP.--check LTGs 05/09/15   Time 8   Period Weeks   Status On-going  03/12/15:  not fully met/ongoing--continue for renewal period   OT LONG TERM GOAL #2   Title Pt will use RUE as gross A/stabilizer for ADLs at least 50% of the time.   Time 8   Period Weeks   Status Achieved  03/10/15   OT LONG TERM GOAL #3   Title Pt will demo at least 60* R shoulder flex with min compensation in prep for  functional reach.   Baseline 45*   Time 8   Period Weeks   Status Revised  03/10/15:  50* with min-mod compensation/cueing; revised 03/12/15   OT LONG TERM GOAL #4   Title Pt will improve RUE functional reaching/coordination for ADLs as shown by improving score on box and blocks test to at least  15 blocks.--continue for renewal, check 05/09/15   Baseline 9 blocks   Time 8   Period Weeks   Status On-going  03/12/15:  10 blocks   OT LONG TERM GOAL #5   Title Pt will perform bathing with supervision.   Time 8   Period Weeks   Status Achieved   OT LONG TERM GOAL #6   Title Pt will verbalize understanding of cognitive/visual compensation strategies and activities to improve cognition/vision prn.   Time 8   Period Weeks   Status Achieved  03/12/15   OT LONG TERM GOAL #7   Title Pt will be able to grasp/release cylinder object independently using good control in 9/10 attempts without drops.--check 04/11/15   Baseline 8   Period Weeks   Status New   OT LONG TERM GOAL #8   Title Pt will be able to write name with RUE with at least 75% legiblity using AE prn.   Time 8   Period Weeks   Status New               Plan - 04/02/15 1202    Clinical Impression Statement Pt slowly progressing toward goals. Pt states he feels that is range and ability to use his RUE for simple things is improving.    Pt will benefit from skilled therapeutic intervention in order to improve on the following deficits (Retired) Decreased cognition;Decreased mobility;Decreased strength;Impaired vision/preception;Impaired UE functional use;Decreased knowledge of use of DME;Decreased balance;Impaired tone;Impaired sensation;Decreased coordination;Decreased range of motion   Rehab Potential Good   Clinical Impairments Affecting Rehab Potential severity of deficits, sensory deficits, ataxia   OT Frequency 2x / week   OT Duration 8 weeks   OT Treatment/Interventions Self-care/ADL training;Therapeutic exercise;Functional  Mobility Training;Patient/family education;Ultrasound;Neuromuscular education;Manual Therapy;Splinting;Therapeutic exercises;Cryotherapy;Parrafin;DME and/or AE instruction;Therapeutic activities;Cognitive remediation/compensation;Visual/perceptual remediation/compensation;Passive range of motion;Moist Heat;Contrast Bath;Fluidtherapy;Electrical Stimulation   Plan NMR, funcitonal use, unlateral low reach with and without assisted reach technique   OT Home Exercise Plan Education provided:  initial HEP issued; 03/03/15 memory compensations, cognitive/visual HEP, coordination HEP issued   Consulted and Agree with Plan of Care Patient        Problem List Patient Active Problem List   Diagnosis Date Noted  . Ear pain 03/23/2015  . Anterior cerebral circulation hemorrhagic infarction ([place]) 11/24/2014  . Hemiparesis affecting right side as late effect of cerebrovascular accident ([place]) 09/05/2014  . Depression due to stroke ([place]) 09/05/2014  . Thrombocytopenia ([place]) 06/05/2014  . Chronic diastolic heart failure, NYHA class 1 ([place]) 06/05/2014  . Ex-smoker 06/03/2014  . Benign paroxysmal positional vertigo 04/28/2014  . Right carotid bruit   . Polyneuropathy, diabetic ([place]) 08/05/2012  . Seasonal allergic rhinitis   . Trigger thumb of left hand 10/12/2011  . Gout   . Adenomatous polyps   . CAD (coronary artery disease)   . Essential hypertension   . Hyperlipidemia   . Type 2 diabetes, uncontrolled, with neuropathy ([place])   . Ejection fraction     Quay Burow, OTR/L 04/02/2015, 12:05 PM  [facility] [address] [place], [place], [address] Phone: [phone]   Fax:  [phone]  Name: Jeffrey [name] MRN: [id number] Date of Birth: 29-Oct-1940

## 2015-04-02 NOTE — Therapy (Signed)
[facility] [address] [place], [place], [address] Phone: [phone]   Fax:  [phone]  Physical Therapy Treatment  Patient Details  Name: Jeffrey [name] MRN: [id number] Date of Birth: 1940-03-18 Referring Provider: Leonie Man  Encounter Date: 04/02/2015      PT End of Session - 04/02/15 1024    Visit Number 22   Number of Visits 25   Date for PT Re-Evaluation 04/09/15   Authorization Type Blue Medicare HMO-G- code every 10th visit   PT Start Time 1016   PT Stop Time 1100   PT Time Calculation (min) 44 min   Equipment Utilized During Treatment --   Activity Tolerance Patient tolerated treatment well   Behavior During Therapy [facility] for tasks assessed/performed      Past Medical History  Diagnosis Date  . CAD (coronary artery disease)     PCI distal RCA ...2004, residual 70% LAD   /   ...nuclear...03/2007...no ischemia.Marland KitchenMarland Kitchenpreserved LV /  nuclear...03/03/2009...inferior scar..no ischemia..EF 51%  . Dyslipidemia     takes Atorvastatin daily  . Internal hemorrhoids   . Gout     takes Allopurinol daily and Colchicine as needed  . Seasonal allergic rhinitis   . Right carotid bruit   . Cyst of nasopharynx     per ENT Wilburn Cornelia  . Myocardial infarction [facility]) 8yr ago  . Peripheral edema     takes Lasix daily  . HTN (hypertension)     takes Cardura,Metoprolol,[name],and Amlodipine daily  . Arthritis   . GERD (gastroesophageal reflux disease)     takes Omeprazole daily  . History of colon polyps   . Type 2 diabetes, uncontrolled, with neuropathy ([place] 1989    takes Invokana daily and has an insulin pump ([name]  . 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 HCAP (healthcare-associated pneumonia)   . Hemiparesis affecting right side as late effect of [place]vascular accident ([place] 09/05[place]2014  . Anterior cerebral circulation hemorrhagic infarction ([facility] 11/24/2014     March, 2016, dominant left thalamic and left internal capsule ischemic infarct with resultant hemorrhagic transformation, secondary to small vessel disease. Resulted in right hemiparesis dysarthria and diplopia   //   readmission with aphasia July, 2016, this improved     Past Surgical History  Procedure Laterality Date  . Cataract extraction Bilateral 2010  . Elbow surgery Right 1997  . Balloon angioplasty, artery  1992, 2004    CAD, Dr. KRon Parker . Colonoscopy  04/03/2002    adenomatous polyp, int hemorrhoids  . Colonoscopy  07/18/2007    normal (Dr. SFuller Plan  . Rotator cuff repair  09/2011    left, with subacromial decompression  . Nasal septum surgery    . Colonoscopy  09/26/2012    tubular adenoma, sm int hem, rpt 5 yrs (Fuller Plan  . Tonsillectomy and adenoidectomy    . Eye lids raised    . Cardiac catheterization  2004  . Polypectomy N/A 05/27/2013    Procedure: ENDOSCOPIC NASOPHARYNGEAL MASS;  Surgeon: DJerrell Belfast MD    There were no vitals filed for this visit.  Visit Diagnosis:  Ataxia  Unsteadiness  Decreased functional mobility  Abnormality of gait      Subjective Assessment - 04/02/15 1024    Subjective Reports no changes since last visit, no falls.   Pertinent History Blood glucose may drop when pt has 2 therapies in a row. Pt's family packs bag of snacks to eat if  pt feels hypoglycemic.   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   Currently in Pain? No/denies            NMR:  Continue to work on gait with quad tip cane for NMR for improved trunk control, increased R lateral weight shift and WB and emphasis on relaxation of RUE.  Pt requires min/guard A to ambulate with cane today, however provided heavy cues and facilitation at RUE for increased attention to RUE as well as at trunk to encourage  upright posture and assist with forward weight shift over RLE during stance.  Transitioned to continued work on trunk activation with trunk shortening/lengthening and trunk dissociation while sitting on balance disc while performing reaching task laterally (more waist level with RUE to avoid over use of R shoulder).  Progressed to standing with LUE assisting RUE while reaching R for small cones and placing L to low stool to encourage BLE squat for LE strengthening and stabilization.  Min/mod A to provide facilitation and activation of trunk during task.    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  Pt states that he has had opportunities preaching to smaller groups, however has not stood yet.  Feel that pt is anxious about this and is focused on personal appearance.  Continue to encourage pt to get up and stand when able at church, especially when giving smaller sermons to smaller groups in order to build confidence and work towards being able to stand in front of larger crowds.  Pt verbalized understanding.                         PT Education - 04/02/15 1024    Education provided Yes   Education Details importance of trunk activation   Person(s) Educated Patient   Methods Explanation   Comprehension Verbalized understanding          PT Short Term Goals - 02/03/15 1324    PT SHORT TERM GOAL #1   Title Pt will perform HEP with family assistance/supervision, for lower extremitiy strengthening.  TARGET 02/06/15   Time 4   Period Weeks   Status Achieved   PT SHORT TERM GOAL #2   Title Pt will perform sit<>stand transfers with supervision, for improved efficiency and safety with transfers.   Baseline Met 11/22.   Time 4   Period Weeks   Status Achieved   PT SHORT TERM GOAL #3   Title Pt will improve TUG score to less than or equal to 70 seconds for decreased fall risk.   Baseline 52.62 seconds with R AFO and heel wedge;73 seconds without  AFO   Time 4   Period Weeks   Status Achieved   PT SHORT TERM GOAL #4   Title Pt will ambulate at least 150 ft using least restrictive assistive device with minimal assistance for improved gait efficiency.   Baseline Met 11/22.   Time 4   Period Weeks   Status Achieved   PT SHORT TERM GOAL #5   Title Pt will verbalize understanding of CVA education.   Time 4   Period Weeks   Status Achieved           PT Long Term Goals - 03/10/15 1413    PT LONG  TERM GOAL #1   Title Pt will verbalize understanding of fall prevention within home environment.  Modified TARGET 04/07/15   Baseline Requires mod cues to recall on 03/10/15   Time 4   Period Weeks   Status On-going   PT LONG TERM GOAL #2   Title Pt will improve Timed Up and GO score to < or equal to 24.95 seconds for decreased fall risk.   Baseline 12/29: TUG = 27.95 sec   Time 4   Period Weeks   Status Revised   PT LONG TERM GOAL #3   Title Pt will improve gait velocity to 1.94 ft/sec for improved gait efficiency and safety.   Baseline 12/29: gait velocity = 1.34 ft/sec   Time 4   Period Weeks   Status Revised   PT LONG TERM GOAL #4   Title Pt will improve BERG balance score to 38/56 in order to indicate decreased fall risk.    Baseline 21/56 (01/12/15) to 34/56 on 03/10/15   Time 4   Period Weeks   Status Revised   PT LONG TERM GOAL #5   Title Pt will ambulate >300' using least restrictive assistive device at mod I level for improved efficiency and safety with gait.   Time 4   Period --   Status Revised   PT LONG TERM GOAL #6   Title Pt will verbalize plans for continued community fitness upon D/C from PT.   Time 4   Period Weeks   Status On-going               Plan - 04/02/15 1028    Clinical Impression Statement Skilled session focused on gait training with use of SPC for NMR, postural control, increased R lateral weight shift and WB with emphasis on relaxed RUE.  Also continue to address improved postural  control with trunk shortening/lengthening task.     Pt will benefit from skilled therapeutic intervention in order to improve on the following deficits Abnormal gait;Decreased balance;Decreased mobility;Decreased coordination;Decreased strength;Difficulty walking;Impaired sensation;Impaired tone   Rehab Potential Good   PT Frequency 2x / week   PT Duration 4 weeks   PT Treatment/Interventions ADLs/Self Care Home Management;Therapeutic exercise;Therapeutic activities;Functional mobility training;Gait training;Balance training;Neuromuscular re-education;Electrical Stimulation   PT Next Visit Plan Continue to address RLE WB, NMR and coordination, gait w/ SPC, consider adding small controlled movements with single "anchor" so that he has to increase trunk control (RUE with LUE, etc), increasing proximal control   Consulted and Agree with Plan of Care Patient        Problem List Patient Active Problem List   Diagnosis Date Noted  . Ear pain 03/23/2015  . Anterior cerebral circulation hemorrhagic infarction ([place]) 11/24/2014  . Hemiparesis affecting right side as late effect of cerebrovascular accident ([place]) 09/05/2014  . Depression due to stroke ([place]) 09/05/2014  . Thrombocytopenia ([place]) 06/05/2014  . Chronic diastolic heart failure, NYHA class 1 ([place]) 06/05/2014  . Ex-smoker 06/03/2014  . Benign paroxysmal positional vertigo 04/28/2014  . Right carotid bruit   . Polyneuropathy, diabetic ([place]) 08/05/2012  . Seasonal allergic rhinitis   . Trigger thumb of left hand 10/12/2011  . Gout   . Adenomatous polyps   . CAD (coronary artery disease)   . Essential hypertension   . Hyperlipidemia   . Type 2 diabetes, uncontrolled, with neuropathy ([place])   . Ejection fraction    Cameron Sprang, PT, MPT [facility] [address] [place] [employer] [employer], [place], [address]  Phone: [phone]   Fax:  [phone] 04/02/2015, 7:38 PM  Name: MICHAELJAMES MILNES MRN:  [id number] Date of Birth: December 19, 1940

## 2015-04-07 ENCOUNTER — Ambulatory Visit: Payer: Medicare Other | Admitting: Rehabilitation

## 2015-04-07 ENCOUNTER — Encounter: Payer: Medicare Other | Admitting: Occupational Therapy

## 2015-04-08 ENCOUNTER — Ambulatory Visit: Payer: PPO | Admitting: Rehabilitation

## 2015-04-08 ENCOUNTER — Encounter: Payer: Self-pay | Admitting: Occupational Therapy

## 2015-04-09 ENCOUNTER — Encounter: Payer: Medicare Other | Admitting: Occupational Therapy

## 2015-04-09 ENCOUNTER — Ambulatory Visit: Payer: Medicare Other | Admitting: Rehabilitation

## 2015-04-09 DIAGNOSIS — Z794 Long term (current) use of insulin: Secondary | ICD-10-CM | POA: Diagnosis not present

## 2015-04-09 DIAGNOSIS — E113299 Type 2 diabetes mellitus with mild nonproliferative diabetic retinopathy without macular edema, unspecified eye: Secondary | ICD-10-CM | POA: Diagnosis not present

## 2015-04-09 DIAGNOSIS — I10 Essential (primary) hypertension: Secondary | ICD-10-CM | POA: Diagnosis not present

## 2015-04-09 DIAGNOSIS — E1142 Type 2 diabetes mellitus with diabetic polyneuropathy: Secondary | ICD-10-CM | POA: Diagnosis not present

## 2015-04-09 DIAGNOSIS — E785 Hyperlipidemia, unspecified: Secondary | ICD-10-CM | POA: Diagnosis not present

## 2015-04-09 LAB — HEMOGLOBIN A1C: Hemoglobin A1C: 6.6

## 2015-04-13 ENCOUNTER — Ambulatory Visit: Payer: PPO | Admitting: Rehabilitation

## 2015-04-13 ENCOUNTER — Encounter: Payer: Self-pay | Admitting: Rehabilitation

## 2015-04-13 ENCOUNTER — Ambulatory Visit: Payer: PPO | Attending: Neurology | Admitting: Occupational Therapy

## 2015-04-13 DIAGNOSIS — R279 Unspecified lack of coordination: Secondary | ICD-10-CM | POA: Diagnosis not present

## 2015-04-13 DIAGNOSIS — R270 Ataxia, unspecified: Secondary | ICD-10-CM

## 2015-04-13 DIAGNOSIS — I69319 Unspecified symptoms and signs involving cognitive functions following cerebral infarction: Secondary | ICD-10-CM | POA: Diagnosis not present

## 2015-04-13 DIAGNOSIS — R269 Unspecified abnormalities of gait and mobility: Secondary | ICD-10-CM | POA: Diagnosis not present

## 2015-04-13 DIAGNOSIS — R2689 Other abnormalities of gait and mobility: Secondary | ICD-10-CM

## 2015-04-13 DIAGNOSIS — G8191 Hemiplegia, unspecified affecting right dominant side: Secondary | ICD-10-CM | POA: Diagnosis not present

## 2015-04-13 DIAGNOSIS — M25611 Stiffness of right shoulder, not elsewhere classified: Secondary | ICD-10-CM

## 2015-04-13 DIAGNOSIS — H539 Unspecified visual disturbance: Secondary | ICD-10-CM | POA: Diagnosis not present

## 2015-04-13 DIAGNOSIS — R278 Other lack of coordination: Secondary | ICD-10-CM

## 2015-04-13 DIAGNOSIS — R2681 Unsteadiness on feet: Secondary | ICD-10-CM

## 2015-04-13 NOTE — Therapy (Signed)
[facility] [address] [place], [place], [address] Phone: [phone]   Fax:  [phone]  Occupational Therapy Treatment  Patient Details  Name: Jeffrey [name] MRN: [id number] Date of Birth: 02/12/1941 Referring Provider: Dr. Leonie Man  Encounter Date: 04/13/2015      OT End of Session - 04/13/15 1400    Visit Number 22   Number of Visits 32   Date for OT Re-Evaluation 05/09/15   Authorization Type Blue Medicare HMO, no visit limit/no auth, G-code   Authorization Time Period renewal completed 10/07/36, cert period 04/12/03-05/14/74,BH 3/8   Authorization - Visit Number 7   Authorization - Number of Visits 30   OT Start Time 1317   OT Stop Time 1400   OT Time Calculation (min) 43 min   Activity Tolerance Patient tolerated treatment well   Behavior During Therapy [facility] for tasks assessed/performed      Past Medical History  Diagnosis Date  . CAD (coronary artery disease)     PCI distal RCA ...2004, residual 70% LAD   /   ...nuclear...03/2007...no ischemia.Marland KitchenMarland Kitchenpreserved LV /  nuclear...03/03/2009...inferior scar..no ischemia..EF 51%  . Dyslipidemia     takes Atorvastatin daily  . Internal hemorrhoids   . Gout     takes Allopurinol daily and Colchicine as needed  . Seasonal allergic rhinitis   . Right carotid bruit   . Cyst of nasopharynx     per ENT Wilburn Cornelia  . Myocardial infarction [facility]) 12yr ago  . Peripheral edema     takes Lasix daily  . HTN (hypertension)     takes Cardura,Metoprolol,[name],and Amlodipine daily  . Arthritis   . GERD (gastroesophageal reflux disease)     takes Omeprazole daily  . History of colon polyps   . Type 2 diabetes, uncontrolled, with neuropathy ([place] 1989    takes Invokana daily and has an insulin pump ([name]  . 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 HCAP (healthcare-associated pneumonia)   . Hemiparesis affecting right side  as late effect of cerebrovascular accident ([place] 09/05/2014  . Anterior cerebral circulation hemorrhagic infarction ([facility] 11/24/2014    March, 2016, dominant left thalamic and left internal capsule ischemic infarct with resultant hemorrhagic transformation, secondary to small vessel disease. Resulted in right hemiparesis dysarthria and diplopia   //   readmission with aphasia July, 2016, this improved     Past Surgical History  Procedure Laterality Date  . Cataract extraction Bilateral 2010  . Elbow surgery Right 1997  . Balloon angioplasty, artery  1992, 2004    CAD, Dr. KRon Parker . Colonoscopy  04/03/2002    adenomatous polyp, int hemorrhoids  . Colonoscopy  07/18/2007    normal (Dr. SFuller Plan  . Rotator cuff repair  09/2011    left, with subacromial decompression  . Nasal septum surgery    . Colonoscopy  09/26/2012    tubular adenoma, sm int hem, rpt 5 yrs (Fuller Plan  . Tonsillectomy and adenoidectomy    . Eye lids raised    . Cardiac catheterization  2004  . Polypectomy N/A 05/27/2013    Procedure: ENDOSCOPIC NASOPHARYNGEAL MASS;  Surgeon: DJerrell Belfast MD    There were no vitals filed for this visit.  Visit Diagnosis:  Right hemiparesis ([place]  Stiffness of right shoulder joint  Decreased coordination  Ataxia  Decreased functional mobility      Subjective Assessment - 04/13/15 1325    Subjective  "I feel like  it is loosening up some, but that I still have a long way to go"   Pertinent History see epic   Patient Stated Goals utilization of right arm "I wish I had more control of it", feel comfortable being around people   Currently in Pain? No/denies                      OT Treatments/Exercises (OP) - 04/13/15 0001    Neurological Re-education Exercises   Scapular Stabilization Bilateral;Prone  with shoulders in ext with min facilitation   Other Information sit>prone>sitting with supervision   Shoulder Flexion AAROM;Both;Supine;Seated  with PVC frame, seated  with min cues for compensation   Elbow Extension AAROM;Right;Supine  with PVC frame for chest press   Other Grasp and Release Exercises  In sitting, low range functional reaching to grasp/release cylinder objects with min facilitation for control.    Seated with weight on hand with arm on body movements for incr UE stability/control with min cues   Other Weight-Bearing Exercises 1 wt. bearing on elbow in sitting with tricep ext/push to sit for incr shoulder stability, neuro re-ed/control, min v.c.   Other Weight-Bearing Exercises 2 wt. bearing in prone on elbows with chest/head press ups for incr shoulder stabilization and scapular depression   Reciprocal Movements Arm bike x20mn level 1 with R hand wrapped for reciprocal movments with good control.    checked STGs--see goals section              OT Short Term Goals - 04/13/15 1402    OT SHORT TERM GOAL #1   Title Pt will be independent with initial HEP.--check STGs 02/10/15   Time 4   Period Weeks   Status Achieved  03/10/15   OT SHORT TERM GOAL #2   Title Pt will use RUE as gross A/stabilizer at least 25% of the time for ADLs.   Time 4   Period Weeks   Status Achieved  02/23/15     OT SHORT TERM GOAL #3   Title Pt will demo at least 55* R shoulder flex with min compensation in prep for functional reach.   Baseline 45*   Time 4   Period Weeks   Status On-going  03/10/15:  50* with min-mod compensation/cueing; 04/13/15  50-55* with min-mod compensation inconsistent   OT SHORT TERM GOAL #4   Title Pt will be able to complete clothing fasteners (buttons, tying shoes) mod I using AE prn.   Time 4   Period Weeks   Status Achieved  02/23/15   OT SHORT TERM GOAL #5   Title Pt will be able to grasp/release cylinder object independently using good control in 7/10 attempts without drops.--check 04/11/15   Time 4   Period Weeks   Status On-going  04/13/15:  not consistent           OT Long Term Goals - 04/02/15 1202    OT LONG  TERM GOAL #1   Title Pt will be independent with updated HEP.--check LTGs 05/09/15   Time 8   Period Weeks   Status On-going  03/12/15:  not fully met/ongoing--continue for renewal period   OT LONG TERM GOAL #2   Title Pt will use RUE as gross A/stabilizer for ADLs at least 50% of the time.   Time 8   Period Weeks   Status Achieved  03/10/15   OT LONG TERM GOAL #3   Title Pt will demo at least 60* R shoulder  flex with min compensation in prep for functional reach.   Baseline 45*   Time 8   Period Weeks   Status Revised  03/10/15:  50* with min-mod compensation/cueing; revised 03/12/15   OT LONG TERM GOAL #4   Title Pt will improve RUE functional reaching/coordination for ADLs as shown by improving score on box and blocks test to at least 15 blocks.--continue for renewal, check 05/09/15   Baseline 9 blocks   Time 8   Period Weeks   Status On-going  03/12/15:  10 blocks   OT LONG TERM GOAL #5   Title Pt will perform bathing with supervision.   Time 8   Period Weeks   Status Achieved   OT LONG TERM GOAL #6   Title Pt will verbalize understanding of cognitive/visual compensation strategies and activities to improve cognition/vision prn.   Time 8   Period Weeks   Status Achieved  03/12/15   OT LONG TERM GOAL #7   Title Pt will be able to grasp/release cylinder object independently using good control in 9/10 attempts without drops.--check 04/11/15   Baseline 8   Period Weeks   Status New   OT LONG TERM GOAL #8   Title Pt will be able to write name with RUE with at least 75% legiblity using AE prn.   Time 8   Period Weeks   Status New               Plan - 04/13/15 1400    Clinical Impression Statement Pt is progressing slowly towards goals with improving shoulder control and AAROM.   Plan neuro re-ed, functional use/functional reaching   OT Home Exercise Plan Education provided:  initial HEP issued; 03/03/15 memory compensations, cognitive/visual HEP, coordination HEP issued    Consulted and Agree with Plan of Care Patient        Problem List Patient Active Problem List   Diagnosis Date Noted  . Ear pain 03/23/2015  . Anterior cerebral circulation hemorrhagic infarction ([place]) 11/24/2014  . Hemiparesis affecting right side as late effect of cerebrovascular accident ([place]) 09/05/2014  . Depression due to stroke ([place]) 09/05/2014  . Thrombocytopenia ([place]) 06/05/2014  . Chronic diastolic heart failure, NYHA class 1 ([place]) 06/05/2014  . Ex-smoker 06/03/2014  . Benign paroxysmal positional vertigo 04/28/2014  . Right carotid bruit   . Polyneuropathy, diabetic ([place]) 08/05/2012  . Seasonal allergic rhinitis   . Trigger thumb of left hand 10/12/2011  . Gout   . Adenomatous polyps   . CAD (coronary artery disease)   . Essential hypertension   . Hyperlipidemia   . Type 2 diabetes, uncontrolled, with neuropathy ([place])   . Ejection fraction     [facility] 04/13/2015, 2:26 PM  [facility] [address] [place], [place], [address] Phone: [phone]   Fax:  [phone]  Name: Jeffrey [name] MRN: [id number] Date of Birth: 1940-12-22  Vianne Bulls, OTR/L 04/13/2015 2:26 PM

## 2015-04-13 NOTE — Therapy (Signed)
[facility] [address] [place], [place], [address] Phone: [phone]   Fax:  [phone]  Physical Therapy Treatment  Patient Details  Name: Jeffrey [name] MRN: [id number] Date of Birth: 23-Aug-1940 Referring Provider: Leonie Man  Encounter Date: 04/13/2015      PT End of Session - 04/13/15 1407    Visit Number 23   Number of Visits 33   Date for PT Re-Evaluation 05/13/15   Authorization Type Blue Medicare HMO-G- code every 10th visit   PT Start Time 1400   PT Stop Time 1445   PT Time Calculation (min) 45 min   Activity Tolerance Patient tolerated treatment well   Behavior During Therapy [facility] for tasks assessed/performed      Past Medical History  Diagnosis Date  . CAD (coronary artery disease)     PCI distal RCA ...2004, residual 70% LAD   /   ...nuclear...03/2007...no ischemia.Marland KitchenMarland Kitchenpreserved LV /  nuclear...03/03/2009...inferior scar..no ischemia..EF 51%  . Dyslipidemia     takes Atorvastatin [name]  . Internal hemorrhoids   . Gout     takes Allopurinol [name] as needed  . Seasonal allergic rhinitis   . Right carotid bruit   . Cyst of nasopharynx     per ENT Wilburn Cornelia  . Myocardial infarction [facility]) 38yr ago  . Peripheral edema     takes Lasix [name]  . HTN (hypertension)     takes Cardura,Metoprolol,[name],and Amlodipine [name]  . Arthritis   . GERD (gastroesophageal reflux disease)     takes Omeprazole [name]  . History of colon polyps   . Type 2 diabetes, uncontrolled, with [place]    takes Invokana [name] ([name]  . 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 HCAP (healthcare-associated pneumonia)   . Hemiparesis affecting right side as late effect of cerebrovascular accident ([place] 09/05/2014  . Anterior cerebral circulation hemorrhagic infarction ([facility] 11/24/2014    March, 2016, dominant left thalamic and  left internal capsule ischemic infarct with resultant hemorrhagic transformation, secondary to small vessel disease. Resulted in right hemiparesis dysarthria and diplopia   //   readmission with aphasia July, 2016, this improved     Past Surgical History  Procedure Laterality Date  . Cataract extraction Bilateral 2010  . Elbow surgery Right 1997  . Balloon angioplasty, artery  1992, 2004    CAD, Dr. KRon Parker . Colonoscopy  04/03/2002    adenomatous polyp, int hemorrhoids  . Colonoscopy  07/18/2007    normal (Dr. SFuller Plan  . Rotator cuff repair  09/2011    left, with subacromial decompression  . Nasal septum surgery    . Colonoscopy  09/26/2012    tubular adenoma, sm int hem, rpt 5 yrs (Fuller Plan  . Tonsillectomy and adenoidectomy    . Eye lids raised    . Cardiac catheterization  2004  . Polypectomy N/A 05/27/2013    Procedure: ENDOSCOPIC NASOPHARYNGEAL MASS;  Surgeon: DJerrell Belfast MD    There were no vitals filed for this visit.  Visit Diagnosis:  Abnormality of gait  Ataxia  Decreased coordination  Unsteadiness  Decreased functional mobility      Subjective Assessment - 04/13/15 1407    Subjective No changes since last visit.  Had many doctors visits so has not been walking as much at home.     Pertinent History Blood glucose may drop when pt has 2 therapies in a row.  Pt's family packs bag of snacks to eat if pt feels hypoglycemic.   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   Currently in Pain? No/denies         NMR:  Performed BERG balance test to assess LTG.  Note details below with new score of 39/56, improving 5 points from previous score approx 1 month ago.  Discussed results with pt and the need to continue use of RW.  Pt verbalized understanding.  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  Will add goals to POC.    TA:  Assessed gait speed and TUG during session again to address LTGs.  See goal section for details.  Note that increased gait speed decreases safety, therefore deferred these goals.  Pt verbalized understanding.                   [facility] Adult PT Treatment/Exercise - 04/13/15 1431    Standardized Balance Assessment   Standardized Balance Assessment Berg Balance Test   Berg Balance Test   Sit to Stand Able to stand without using hands and stabilize independently   Standing Unsupported Able to stand safely 2 minutes   Sitting with Back Unsupported but Feet Supported on Floor or Stool Able to sit safely and securely 2 minutes   Stand to Sit Sits safely with minimal use of hands   Transfers Able to transfer safely, definite need of hands   Standing Unsupported with Eyes Closed Able to stand 10 seconds with supervision   Standing Ubsupported with Feet Together Able to place feet together independently and stand for 1 minute with supervision   From Standing, Reach Forward with Outstretched Arm Can reach confidently >25 cm (10")   From Standing Position, Pick up Object from Floor Able to pick up shoe, needs supervision   From Standing Position, Turn to Look Behind Over each Shoulder Looks behind one side only/other side shows less weight shift   Turn 360 Degrees Needs close supervision or verbal cueing   Standing Unsupported, Alternately Place Feet on Step/Stool Able to complete >2 steps/needs minimal assist   Standing Unsupported, One Foot in Front Able to take small step independently and hold 30 seconds   Standing on One Leg Unable to try or needs assist to prevent fall   Total Score 39                PT Education - 04/13/15 1407    Education provided Yes   Education Details Discussion of goals addressed during this session and making new goals for extended POC    Person(s) Educated Patient   Methods Explanation   Comprehension Verbalized understanding          PT Short Term Goals - 02/03/15 1324    PT SHORT TERM GOAL #1   Title Pt will perform HEP with family assistance/supervision, for lower extremitiy strengthening.  TARGET 02/06/15   Time 4   Period Weeks   Status Achieved   PT SHORT TERM GOAL #2   Title Pt will perform sit<>stand transfers with supervision, for improved efficiency and safety with transfers.   Baseline Met 11/22.   Time 4   Period Weeks   Status Achieved   PT SHORT TERM GOAL #3  Title Pt will improve TUG score to less than or equal to 70 seconds for decreased fall risk.   Baseline 52.62 seconds with R AFO and heel wedge;73 seconds without AFO   Time 4   Period Weeks   Status Achieved   PT SHORT TERM GOAL #4   Title Pt will ambulate at least 150 ft using least restrictive assistive device with minimal assistance for improved gait efficiency.   Baseline Met 11/22.   Time 4   Period Weeks   Status Achieved   PT SHORT TERM GOAL #5   Title Pt will verbalize understanding of CVA education.   Time 4   Period Weeks   Status Achieved           PT Long Term Goals - 04/13/15 1411    PT LONG TERM GOAL #1   Title Pt will verbalize understanding of fall prevention within home environment.  Modified TARGET 04/07/15   Baseline met 04/13/15   Time 4   Period Weeks   Status Achieved   PT LONG TERM GOAL #2   Title Pt will improve Timed Up and GO score to < 25 seconds for decreased fall risk.   Baseline 28.88 on 04/13/15  Feel that gait speed no longer safe goal due to incoordination   Time 4   Period Weeks   Status Deferred   PT LONG TERM GOAL #3   Title Pt will improve gait velocity to 1.94 ft/sec for improved gait efficiency and safety.   Baseline 1.33 ft/sec on 04/13/15  feel that gait speed goal no longer safe due to incoordination   Time 4   Period Weeks   Status Deferred   PT LONG TERM GOAL #4   Title Pt will  improve BERG balance score to 43/56 in order to indicate decreased fall risk. (Modified Target date: 05/11/15)   Baseline 39/56 on 04/13/15  updated due to goal met on 04/13/15   Time 4   Period Weeks   Status Revised   PT LONG TERM GOAL #5   Title Pt will ambulate >300' using least restrictive assistive device at mod I level for improved efficiency and safety with gait.   Baseline met 04/13/15 (indoor surfaces)    Time 4   Status Achieved   PT LONG TERM GOAL #6   Title Pt will verbalize plans for continued community fitness upon D/C from PT. 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  (Target Date: 05/11/15)   Status New   PT LONG TERM GOAL #8   Title Pt will demonstrate compensatory strategy for standing balance in order to relieve RUE for functional task in standing. (Target Date: 05/11/15)   Status New               Plan - 04/13/15 1408    Clinical Impression Statement Skilled session focused on addressing LTG's.  Pt has met 3/6 LTGs, continuing to demonstrate poor balance, decreased coordination, and gait instability.  Plan to request 4 additional weeks for POC and made new additions to goals, see below.  Pt verbalized understanding.     Pt will benefit from skilled therapeutic intervention in order to improve on the following deficits Abnormal gait;Decreased balance;Decreased mobility;Decreased coordination;Decreased strength;Difficulty walking;Impaired  sensation;Impaired tone   Rehab Potential Good   PT Frequency 2x / week   PT Duration 4 weeks   PT Treatment/Interventions ADLs/Self Care Home Management;Therapeutic exercise;Therapeutic activities;Functional mobility training;Gait training;Balance training;Neuromuscular re-education;Electrical Stimulation   PT Next Visit Plan  Continue to address RLE WB, NMR and coordination, gait w/ SPC, consider adding small controlled movements with single "anchor" so that he has to increase trunk control (RUE with LUE, etc), increasing proximal control   Consulted and Agree with Plan of Care Patient        Problem List Patient Active Problem List   Diagnosis Date Noted  . Ear pain 03/23/2015  . Anterior cerebral circulation hemorrhagic infarction ([place]) 11/24/2014  . Hemiparesis affecting right side as late effect of cerebrovascular accident ([place]) 09/05/2014  . Depression due to stroke ([place]) 09/05/2014  . Thrombocytopenia ([place]) 06/05/2014  . Chronic diastolic heart failure, NYHA class 1 ([place]) 06/05/2014  . Ex-smoker 06/03/2014  . Benign paroxysmal positional vertigo 04/28/2014  . Right carotid bruit   . Polyneuropathy, diabetic ([place]) 08/05/2012  . Seasonal allergic rhinitis   . Trigger thumb of left hand 10/12/2011  . Gout   . Adenomatous polyps   . CAD (coronary artery disease)   . Essential hypertension   . Hyperlipidemia   . Type 2 diabetes, uncontrolled, with neuropathy ([place])   . Ejection fraction     Cameron Sprang, PT, MPT [facility] [address] [place], [place], [address] Phone: [phone]   Fax:  [phone] 04/13/2015, 5:46 PM  Name: Jeffrey [name] MRN: [id number] Date of Birth: May 21, 1940

## 2015-04-16 ENCOUNTER — Ambulatory Visit: Payer: PPO | Admitting: Rehabilitation

## 2015-04-16 ENCOUNTER — Encounter: Payer: Self-pay | Admitting: Rehabilitation

## 2015-04-16 ENCOUNTER — Ambulatory Visit: Payer: PPO | Admitting: Occupational Therapy

## 2015-04-16 DIAGNOSIS — R278 Other lack of coordination: Secondary | ICD-10-CM

## 2015-04-16 DIAGNOSIS — R269 Unspecified abnormalities of gait and mobility: Secondary | ICD-10-CM

## 2015-04-16 DIAGNOSIS — R270 Ataxia, unspecified: Secondary | ICD-10-CM

## 2015-04-16 DIAGNOSIS — G8191 Hemiplegia, unspecified affecting right dominant side: Secondary | ICD-10-CM | POA: Diagnosis not present

## 2015-04-16 DIAGNOSIS — R279 Unspecified lack of coordination: Secondary | ICD-10-CM

## 2015-04-16 DIAGNOSIS — R2689 Other abnormalities of gait and mobility: Secondary | ICD-10-CM

## 2015-04-16 DIAGNOSIS — R2681 Unsteadiness on feet: Secondary | ICD-10-CM

## 2015-04-16 NOTE — Therapy (Signed)
[facility] [address] [place], [place], [address] Phone: [phone]   Fax:  [phone]  Physical Therapy Treatment  Patient Details  Name: Jeffrey [name] MRN: [id number] Date of Birth: 1940-06-18 Referring Provider: Leonie Man  Encounter Date: 04/16/2015      PT End of Session - 04/16/15 1110    Visit Number 24   Number of Visits 33   Date for PT Re-Evaluation 05/13/15   Authorization Type Blue Medicare HMO-G- code every 10th visit   PT Start Time 1105   PT Stop Time 1150   PT Time Calculation (min) 45 min   Activity Tolerance Patient tolerated treatment well   Behavior During Therapy [facility] for tasks assessed/performed      Past Medical History  Diagnosis Date  . CAD (coronary artery disease)     PCI distal RCA ...2004, residual 70% LAD   /   ...nuclear...03/2007...no ischemia.Marland KitchenMarland Kitchenpreserved LV /  nuclear...03/03/2009...inferior scar..no ischemia..EF 51%  . Dyslipidemia     takes Atorvastatin daily  . Internal hemorrhoids   . Gout     takes Allopurinol daily and Colchicine as needed  . Seasonal allergic rhinitis   . Right carotid bruit   . Cyst of nasopharynx     per ENT Wilburn Cornelia  . Myocardial infarction [facility]) 56yr ago  . Peripheral edema     takes Lasix daily  . HTN (hypertension)     takes Cardura,Metoprolol,[name],and Amlodipine daily  . Arthritis   . GERD (gastroesophageal reflux disease)     takes Omeprazole daily  . History of colon polyps   . Type 2 diabetes, uncontrolled, with neuropathy ([place] 1989    takes Invokana daily and has an insulin pump ([name]  . 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 HCAP (healthcare-associated pneumonia)   . Hemiparesis affecting right side as late effect of cerebrovascular accident ([place] 09/05/2014  . Anterior cerebral circulation hemorrhagic infarction ([facility] 11/24/2014    March, 2016, dominant left thalamic and  left internal capsule ischemic infarct with resultant hemorrhagic transformation, secondary to small vessel disease. Resulted in right hemiparesis dysarthria and diplopia   //   readmission with aphasia July, 2016, this improved     Past Surgical History  Procedure Laterality Date  . Cataract extraction Bilateral 2010  . Elbow surgery Right 1997  . Balloon angioplasty, artery  1992, 2004    CAD, Dr. KRon Parker . Colonoscopy  04/03/2002    adenomatous polyp, int hemorrhoids  . Colonoscopy  07/18/2007    normal (Dr. SFuller Plan  . Rotator cuff repair  09/2011    left, with subacromial decompression  . Nasal septum surgery    . Colonoscopy  09/26/2012    tubular adenoma, sm int hem, rpt 5 yrs (Fuller Plan  . Tonsillectomy and adenoidectomy    . Eye lids raised    . Cardiac catheterization  2004  . Polypectomy N/A 05/27/2013    Procedure: ENDOSCOPIC NASOPHARYNGEAL MASS;  Surgeon: DJerrell Belfast MD    There were no vitals filed for this visit.  Visit Diagnosis:  Unsteadiness  Ataxia  Decreased functional mobility  Abnormality of gait      Subjective Assessment - 04/16/15 1109    Subjective No changes since last visit, no falls.     Pertinent History Blood glucose may drop when pt has 2 therapies in a row. Pt's family packs bag of snacks to eat if pt feels hypoglycemic.   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   Currently in Pain? 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  Discussed that he will need to always keep single point of contact with either UE for support and stabilization.  Performed standing activity at counter top with LLE on 6" block shifting forwards and performing lateral reaching  transitioning from L UE to RUE to maintain single point of control.  Provided heavy facilitation for upright posture and decreased reliance on bony and ligamentous structures.  Transitioned to more leisure like activity with "podium" placed in front of pt switching UE support while moving object from podium to the R or the L.  Performed x 2 sets of 15 reps.  Then added increased balance challenge with stepping out to the R or L while maintaining stability on the other side while stepping and placing object.  Again, performed x 2 sets of 15 reps with more mod A for continued upright posture, increased R lateral weight shift (midline WB when facing podium).  Ended session with BUE support on podium while LLE stepped to two targets to the L to increase RLE weight shift and WB during task.  Pt still with significant sensory and proprioceptive deficits during session.             PT Education - 04/16/15 1109    Education provided Yes   Education Details maintaining point of stability when switching between UEs.    Person(s) Educated Patient   Methods Explanation   Comprehension Verbalized understanding          PT Short Term Goals - 02/03/15 1324    PT SHORT TERM GOAL #1   Title Pt will perform HEP with family assistance/supervision, for lower extremitiy strengthening.  TARGET 02/06/15   Time 4   Period Weeks   Status Achieved   PT SHORT TERM GOAL #2   Title Pt will perform sit<>stand transfers with supervision, for improved efficiency and safety with transfers.   Baseline Met 11/22.   Time 4   Period Weeks   Status Achieved   PT SHORT TERM GOAL #3   Title Pt will improve TUG score to less than or equal to 70 seconds for decreased fall risk.   Baseline 52.62 seconds with R AFO and heel wedge;73 seconds without AFO   Time 4   Period Weeks   Status Achieved   PT SHORT TERM GOAL #4   Title Pt will ambulate at least 150 ft using least restrictive assistive device with minimal assistance  for improved gait efficiency.   Baseline Met 11/22.   Time 4   Period Weeks   Status Achieved   PT SHORT TERM GOAL #5   Title Pt will verbalize understanding of CVA education.   Time 4   Period Weeks   Status Achieved           PT Long Term Goals - 04/13/15 1411    PT LONG TERM GOAL #1   Title Pt will verbalize understanding of fall prevention within home environment.  Modified TARGET 04/07/15   Baseline met 04/13/15   Time 4   Period Weeks   Status Achieved  PT LONG TERM GOAL #2   Title Pt will improve Timed Up and GO score to < 25 seconds for decreased fall risk.   Baseline 28.88 on 04/13/15  Feel that gait speed no longer safe goal due to incoordination   Time 4   Period Weeks   Status Deferred   PT LONG TERM GOAL #3   Title Pt will improve gait velocity to 1.94 ft/sec for improved gait efficiency and safety.   Baseline 1.33 ft/sec on 04/13/15  feel that gait speed goal no longer safe due to incoordination   Time 4   Period Weeks   Status Deferred   PT LONG TERM GOAL #4   Title Pt will improve BERG balance score to 43/56 in order to indicate decreased fall risk. (Modified Target date: 05/11/15)   Baseline 39/56 on 04/13/15  updated due to goal met on 04/13/15   Time 4   Period Weeks   Status Revised   PT LONG TERM GOAL #5   Title Pt will ambulate >300' using least restrictive assistive device at mod I level for improved efficiency and safety with gait.   Baseline met 04/13/15 (indoor surfaces)    Time 4   Status Achieved   PT LONG TERM GOAL #6   Title Pt will verbalize plans for continued community fitness upon D/C from PT. 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  (Target Date: 05/11/15)   Status New   PT LONG TERM GOAL #8   Title Pt will demonstrate  compensatory strategy for standing balance in order to relieve RUE for functional task in standing. (Target Date: 05/11/15)   Status New               Plan - 04/16/15 1110    Clinical Impression Statement Skilled session focused on NMR and working towards LTG's with dynamic standing balance for midline orientation, adequate weight shifting and maintaining point of stabilization when switching between UEs.     Pt will benefit from skilled therapeutic intervention in order to improve on the following deficits Abnormal gait;Decreased balance;Decreased mobility;Decreased coordination;Decreased strength;Difficulty walking;Impaired sensation;Impaired tone   Rehab Potential Good   PT Frequency 2x / week   PT Duration 4 weeks   PT Treatment/Interventions ADLs/Self Care Home Management;Therapeutic exercise;Therapeutic activities;Functional mobility training;Gait training;Balance training;Neuromuscular re-education;Electrical Stimulation   PT Next Visit Plan Continue to address RLE WB, NMR and coordination, gait w/ SPC, consider adding small controlled movements with single "anchor" so that he has to increase trunk control (RUE with LUE, etc), increasing proximal control   Consulted and Agree with Plan of Care Patient        Problem List Patient Active Problem List   Diagnosis Date Noted  . Ear pain 03/23/2015  . Anterior cerebral circulation hemorrhagic infarction ([place]) 11/24/2014  . Hemiparesis affecting right side as late effect of cerebrovascular accident ([place]) 09/05/2014  . Depression due to stroke ([place]) 09/05/2014  . Thrombocytopenia ([place]) 06/05/2014  . Chronic diastolic heart failure, NYHA class 1 ([place]) 06/05/2014  . Ex-smoker 06/03/2014  . Benign paroxysmal positional vertigo 04/28/2014  . Right carotid bruit   . Polyneuropathy, diabetic ([place]) 08/05/2012  . Seasonal allergic rhinitis   . Trigger thumb of left hand 10/12/2011  .  Gout   . Adenomatous polyps   . CAD (coronary  artery disease)   . Essential hypertension   . Hyperlipidemia   . Type 2 diabetes, uncontrolled, with neuropathy ([place])   . Ejection fraction     Cameron Sprang, PT, MPT [facility] [address] [place], [place], [address] Phone: [phone]   Fax:  [phone] 04/16/2015, 12:22 PM  Name: Jeffrey [name] MRN: [id number] Date of Birth: 1940/11/10

## 2015-04-16 NOTE — Therapy (Signed)
[facility] [address] [place], [place], [address] Phone: [phone]   Fax:  [phone]  Occupational Therapy Treatment  Patient Details  Name: Jeffrey [name] MRN: [id number] Date of Birth: 12/15/1940 Referring Provider: Dr. Leonie Man  Encounter Date: 04/16/2015      OT End of Session - 04/16/15 1140    Visit Number 23   Number of Visits 32   Date for OT Re-Evaluation 05/09/15   Authorization Type [employer] HMO, no visit limit/no auth, G-code   Authorization Time Period renewal completed 04/10/51, cert period 08/06/42-05/05/52,MG 3/8   Authorization - Visit Number 23   Authorization - Number of Visits 30   OT Start Time 1022   OT Stop Time 1103   OT Time Calculation (min) 41 min   Activity Tolerance Patient tolerated treatment well   Behavior During Therapy [facility] for tasks assessed/performed      Past Medical History  Diagnosis Date  . CAD (coronary artery disease)     PCI distal RCA ...2004, residual 70% LAD   /   ...nuclear...03/2007...no ischemia.Marland KitchenMarland Kitchenpreserved LV /  nuclear...03/03/2009...inferior scar..no ischemia..EF 51%  . Dyslipidemia     takes Atorvastatin daily  . Internal hemorrhoids   . Gout     takes Allopurinol daily and Colchicine as needed  . Seasonal allergic rhinitis   . Right carotid bruit   . Cyst of nasopharynx     per ENT Wilburn Cornelia  . Myocardial infarction [facility]) 84yr ago  . Peripheral edema     takes Lasix daily  . HTN (hypertension)     takes Cardura,Metoprolol,[name],and Amlodipine daily  . Arthritis   . GERD (gastroesophageal reflux disease)     takes Omeprazole daily  . History of colon polyps   . Type 2 diabetes, uncontrolled, with neuropathy ([place] 1989    takes Invokana daily and has an insulin pump ([name]  . 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 HCAP (healthcare-associated pneumonia)   . Hemiparesis affecting right side  as late effect of [place]vascular [place] 09/[place]  . Anterior cerebral circulation hemorrhagic infarction ([facility] 11/24/2014    March, 2016, dominant left thalamic and left internal capsule ischemic infarct with resultant hemorrhagic transformation, secondary to small vessel disease. Resulted in right hemiparesis dysarthria and diplopia   //   readmission with aphasia July, 2016, this improved     Past Surgical History  Procedure Laterality Date  . Cataract extraction Bilateral 2010  . Elbow surgery Right 1997  . Balloon angioplasty, artery  1992, 2004    CAD, Dr. KRon Parker . Colonoscopy  04/03/2002    adenomatous polyp, int hemorrhoids  . Colonoscopy  07/18/2007    normal (Dr. SFuller Plan  . Rotator cuff repair  09/2011    left, with subacromial decompression  . Nasal septum surgery    . Colonoscopy  09/26/2012    tubular adenoma, sm int hem, rpt 5 yrs (Fuller Plan  . Tonsillectomy and adenoidectomy    . Eye lids raised    . Cardiac catheterization  2004  . Polypectomy N/A 05/27/2013    Procedure: ENDOSCOPIC NASOPHARYNGEAL MASS;  Surgeon: DJerrell Belfast MD    There were no vitals filed for this visit.  Visit Diagnosis:  Right hemiparesis ([facility]  Decreased coordination  Ataxia      Subjective Assessment - 04/16/15 1138    Subjective  My pinky does not want to work   Pertinent History see epic  Patient Stated Goals utilization of right arm "I wish I had more control of it", feel comfortable being around people   Currently in Pain? No/denies                      OT Treatments/Exercises (OP) - 04/16/15 0001    Neurological Re-education Exercises   Shoulder Flexion AAROM;Both;Supine;Seated  with PVC frame    Elbow Extension AAROM;Right;Supine  with PVC frame   Other Grasp and Release Exercises  In sitting, low range functional reaching to grasp/release cylinder objects with min facilitation for control with RUE.  Also performed using L hand facilitation with min cues  for shoulder hike   Seated with weight on hand with arm on body movements for incr UE stability/control with min cues   Other Weight-Bearing Exercises 1 wt. bearing on elbow in sitting with tricep ext/push to sit for incr shoulder stability, neuro re-ed/control, min v.c.   Reciprocal Movements Arm bike x40mn level 1 with R hand wrapped for reciprocal movments with good control after min cueing initially                OT Education - 04/16/15 1139    Education Details Recommended pt begin performing wt.bearing on elbow in sitting with elbow ext/push to sit   Person(s) Educated Patient   Methods Explanation;Demonstration;Verbal cues   Comprehension Verbalized understanding;Returned demonstration          OT Short Term Goals - 04/16/15 1143    OT SHORT TERM GOAL #1   Time --   OT SHORT TERM GOAL #3   Title Pt will demo at least 55* R shoulder flex with min compensation in prep for functional reach.   Baseline 45*   Time 4   Period Weeks   Status On-going  03/10/15:  50* with min-mod compensation/cueing; 04/13/15  50-55* with min-mod compensation inconsistent   OT SHORT TERM GOAL #5   Title Pt will be able to grasp/release cylinder object independently using good control in 7/10 attempts without drops.--check 04/11/15   Time 4   Period Weeks   Status On-going  04/13/15:  not consistent           OT Long Term Goals - 04/16/15 1142    OT LONG TERM GOAL #1   Title Pt will be independent with updated HEP.--check LTGs 05/09/15   Time 8   Period Weeks   Status On-going  03/12/15:  not fully met/ongoing--continue for renewal period   OT LONG TERM GOAL #3   Title Pt will demo at least 60* R shoulder flex with min compensation in prep for functional reach.   Baseline 45*   Time 8   Period Weeks   Status Revised  03/10/15:  50* with min-mod compensation/cueing; revised 03/12/15   OT LONG TERM GOAL #4   Title Pt will improve RUE functional reaching/coordination for ADLs as shown by  improving score on box and blocks test to at least 15 blocks.--continue for renewal, check 05/09/15   Baseline 9 blocks   Time 8   Period Weeks   Status On-going  03/12/15:  10 blocks   OT LONG TERM GOAL #7   Title Pt will be able to grasp/release cylinder object independently using good control in 9/10 attempts without drops.--check 04/11/15   Baseline 8   Period Weeks   Status New   OT LONG TERM GOAL #8   Title Pt will be able to write name with RUE with at  least 75% legiblity using AE prn.   Time 8   Period Weeks   Status New               Plan - 04/16/15 1141    Clinical Impression Statement Pt continues to progress slowly with improving shoulder control and AAROM.   Plan neuro re-ed, functional use, functional reaching   OT Home Exercise Plan Education provided:  initial HEP issued; 03/03/15 memory compensations, cognitive/visual HEP, coordination HEP issued; wt. bearing on elbow with push to sitting   Consulted and Agree with Plan of Care Patient        Problem List Patient Active Problem List   Diagnosis Date Noted  . Ear pain 03/23/2015  . Anterior cerebral circulation hemorrhagic infarction ([place]) 11/24/2014  . Hemiparesis affecting right side as late effect of cerebrovascular accident ([place]) 09/05/2014  . Depression due to stroke ([place]) 09/05/2014  . Thrombocytopenia ([place]) 06/05/2014  . Chronic diastolic heart failure, NYHA class 1 ([place]) 06/05/2014  . Ex-smoker 06/03/2014  . Benign paroxysmal positional vertigo 04/28/2014  . Right carotid bruit   . Polyneuropathy, diabetic ([place]) 08/05/2012  . Seasonal allergic rhinitis   . Trigger thumb of left hand 10/12/2011  . Gout   . Adenomatous polyps   . CAD (coronary artery disease)   . Essential hypertension   . Hyperlipidemia   . Type 2 diabetes, uncontrolled, with neuropathy ([place])   . Ejection fraction     [facility] 04/16/2015, 11:46 AM  [facility] [address] [place], [place], [address] Phone: [phone]   Fax:  [phone]  Name: FAIN FRANCIS MRN: [id number] Date of Birth: March 21, 1940  Vianne Bulls, OTR/L 04/16/2015 11:49 AM

## 2015-04-20 ENCOUNTER — Encounter: Payer: Self-pay | Admitting: Rehabilitation

## 2015-04-20 ENCOUNTER — Ambulatory Visit: Payer: PPO | Admitting: Occupational Therapy

## 2015-04-20 ENCOUNTER — Encounter: Payer: Self-pay | Admitting: Occupational Therapy

## 2015-04-20 ENCOUNTER — Ambulatory Visit: Payer: PPO | Admitting: Rehabilitation

## 2015-04-20 DIAGNOSIS — R2681 Unsteadiness on feet: Secondary | ICD-10-CM

## 2015-04-20 DIAGNOSIS — G8191 Hemiplegia, unspecified affecting right dominant side: Secondary | ICD-10-CM

## 2015-04-20 DIAGNOSIS — H539 Unspecified visual disturbance: Secondary | ICD-10-CM

## 2015-04-20 DIAGNOSIS — R270 Ataxia, unspecified: Secondary | ICD-10-CM

## 2015-04-20 DIAGNOSIS — R278 Other lack of coordination: Secondary | ICD-10-CM

## 2015-04-20 DIAGNOSIS — M25611 Stiffness of right shoulder, not elsewhere classified: Secondary | ICD-10-CM

## 2015-04-20 DIAGNOSIS — R269 Unspecified abnormalities of gait and mobility: Secondary | ICD-10-CM

## 2015-04-20 DIAGNOSIS — R279 Unspecified lack of coordination: Secondary | ICD-10-CM

## 2015-04-20 DIAGNOSIS — I69319 Unspecified symptoms and signs involving cognitive functions following cerebral infarction: Secondary | ICD-10-CM

## 2015-04-20 NOTE — Therapy (Signed)
[facility] [address] [name], [place], [address] Phone: [phone]   Fax:  [phone]  Occupational Therapy Treatment  Patient Details  Name: Jeffrey [name] MRN: [id number] Date of Birth: 1940/10/27 Referring Provider: Dr. Leonie Man  Encounter Date: 04/20/2015      OT End of Session - 04/20/15 1724    Visit Number 24   Number of Visits 32   Date for OT Re-Evaluation 05/09/15   Authorization Type Blue Medicare HMO, no visit limit/no auth, G-code   Authorization Time Period renewal completed 07/14/54, cert period 05/12/73-08/09/31,IR 3/8   Authorization - Visit Number 24   Authorization - Number of Visits 30   OT Start Time 1315   OT Stop Time 1358   OT Time Calculation (min) 43 min   Activity Tolerance Patient tolerated treatment well      Past Medical History  Diagnosis Date  . CAD (coronary artery disease)     PCI distal RCA ...2004, residual 70% LAD   /   ...nuclear...03/2007...no ischemia.Marland KitchenMarland Kitchenpreserved LV /  nuclear...03/03/2009...inferior scar..no ischemia..EF 51%  . Dyslipidemia     takes Atorvastatin daily  . Internal hemorrhoids   . Gout     takes Allopurinol daily and Colchicine as needed  . Seasonal allergic rhinitis   . Right carotid bruit   . Cyst of nasopharynx     per ENT Wilburn Cornelia  . Myocardial infarction [facility]) 68yr ago  . Peripheral edema     takes Lasix daily  . HTN (hypertension)     takes Cardura,Metoprolol,[name],and Amlodipine daily  . Arthritis   . GERD (gastroesophageal reflux disease)     takes Omeprazole daily  . History of colon polyps   . Type 2 diabetes, uncontrolled, with neuropathy ([place] 1989    takes Invokana daily and has an insulin pump ([name]  . 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 HCAP (healthcare-associated pneumonia)   . Hemiparesis affecting right side as late effect of cerebrovascular accident ([place] 09/05/2014   . Anterior cerebral circulation hemorrhagic infarction ([facility] 11/24/2014    March, 2016, dominant left thalamic and left internal capsule ischemic infarct with resultant hemorrhagic transformation, secondary to small vessel disease. Resulted in right hemiparesis dysarthria and diplopia   //   readmission with aphasia July, 2016, this improved     Past Surgical History  Procedure Laterality Date  . Cataract extraction Bilateral 2010  . Elbow surgery Right 1997  . Balloon angioplasty, artery  1992, 2004    CAD, Dr. KRon Parker . Colonoscopy  04/03/2002    adenomatous polyp, int hemorrhoids  . Colonoscopy  07/18/2007    normal (Dr. SFuller Plan  . Rotator cuff repair  09/2011    left, with subacromial decompression  . Nasal septum surgery    . Colonoscopy  09/26/2012    tubular adenoma, sm int hem, rpt 5 yrs (Fuller Plan  . Tonsillectomy and adenoidectomy    . Eye lids raised    . Cardiac catheterization  2004  . Polypectomy N/A 05/27/2013    Procedure: ENDOSCOPIC NASOPHARYNGEAL MASS;  Surgeon: DJerrell Belfast MD    There were no vitals filed for this visit.  Visit Diagnosis:  Right hemiparesis (HCC)  Decreased coordination  Stiffness of right shoulder joint  Visual disturbance  Cognitive deficits following cerebral infarction      Subjective Assessment - 04/20/15 1323    Subjective  I wish I could get outside more but I  don't think I am moving well enough yet   Pertinent History see epic   Patient Stated Goals utilization of right arm "I wish I had more control of it", feel comfortable being around people   Currently in Pain? No/denies                      OT Treatments/Exercises (OP) - 04/20/15 0001    Neurological Re-education Exercises   Other Exercises 1 Neuro re ed to address RUE control, functional use of RUE in tasks incoporating low to mid reach using assisted reach with terminal manipulation task. Emphasis on weight shifting in sitting to R for reach and activity.   Also addressed bilateral closed chain overhead reach. Pt needs signficant repetition and max vc's to over ride compensations.                  OT Short Term Goals - 04/20/15 1719    OT SHORT TERM GOAL #3   Title Pt will demo at least 55* R shoulder flex with min compensation in prep for functional reach.   Baseline 45*   Time 4   Period Weeks   Status On-going  03/10/15:  50* with min-mod compensation/cueing; 04/13/15  50-55* with min-mod compensation inconsistent   OT SHORT TERM GOAL #5   Title Pt will be able to grasp/release cylinder object independently using good control in 7/10 attempts without drops.--check 04/11/15   Time 4   Period Weeks   Status On-going  04/13/15:  not consistent           OT Long Term Goals - 04/20/15 1720    OT LONG TERM GOAL #1   Title Pt will be independent with updated HEP.--check LTGs 05/09/15   Time 8   Period Weeks   Status On-going  03/12/15:  not fully met/ongoing--continue for renewal period   OT LONG TERM GOAL #3   Title Pt will demo at least 60* R shoulder flex with min compensation in prep for functional reach.   Baseline 45*   Time 8   Period Weeks   Status Revised  03/10/15:  50* with min-mod compensation/cueing; revised 03/12/15   OT LONG TERM GOAL #4   Title Pt will improve RUE functional reaching/coordination for ADLs as shown by improving score on box and blocks test to at least 15 blocks.--continue for renewal, check 05/09/15   Baseline 9 blocks   Time 8   Period Weeks   Status On-going  03/12/15:  10 blocks   OT LONG TERM GOAL #7   Title Pt will be able to grasp/release cylinder object independently using good control in 9/10 attempts without drops.--check 04/11/15   Baseline 8   Period Weeks   Status New   OT LONG TERM GOAL #8   Title Pt will be able to write name with RUE with at least 75% legiblity using AE prn.   Time 8   Period Weeks   Status New               Plan - 04/20/15 1720    Clinical Impression  Statement Pt makkng slow progress toward goals. Pt beneftis from repetition and practice.    Pt will benefit from skilled therapeutic intervention in order to improve on the following deficits (Retired) Decreased cognition;Decreased mobility;Decreased strength;Impaired vision/preception;Impaired UE functional use;Decreased knowledge of use of DME;Decreased balance;Impaired tone;Impaired sensation;Decreased coordination;Decreased range of motion   Rehab Potential Good   Clinical Impairments Affecting Rehab Potential  severity of deficits, sensory deficits, ataxia   OT Frequency 2x / week   OT Duration 8 weeks   OT Treatment/Interventions Self-care/ADL training;Therapeutic exercise;Functional Mobility Training;Patient/family education;Ultrasound;Neuromuscular education;Manual Therapy;Splinting;Therapeutic exercises;Cryotherapy;Parrafin;DME and/or AE instruction;Therapeutic activities;Cognitive remediation/compensation;Visual/perceptual remediation/compensation;Passive range of motion;Moist Heat;Contrast Bath;Fluidtherapy;Electrical Stimulation   Plan neuro re ed, funcitonal use, functional reaching   OT Home Exercise Plan Education provided:  initial HEP issued; 03/03/15 memory compensations, cognitive/visual HEP, coordination HEP issued; wt. bearing on elbow with push to sitting   Consulted and Agree with Plan of Care Patient        Problem List Patient Active Problem List   Diagnosis Date Noted  . Ear pain 03/23/2015  . Anterior cerebral circulation hemorrhagic infarction ([place]) 11/24/2014  . Hemiparesis affecting right side as late effect of cerebrovascular accident ([place]) 09/05/2014  . Depression due to stroke ([place]) 09/05/2014  . Thrombocytopenia ([place]) 06/05/2014  . Chronic diastolic heart failure, NYHA class 1 ([place]) 06/05/2014  . Ex-smoker 06/03/2014  . Benign paroxysmal positional vertigo 04/28/2014  . Right carotid bruit   . Polyneuropathy, diabetic ([place]) 08/05/2012  . Seasonal allergic  rhinitis   . Trigger thumb of left hand 10/12/2011  . Gout   . Adenomatous polyps   . CAD (coronary artery disease)   . Essential hypertension   . Hyperlipidemia   . Type 2 diabetes, uncontrolled, with neuropathy ([place])   . Ejection fraction     Forde Radon [facility] 04/20/2015, 5:26 PM  [facility] [address] [place], [place], [address] Phone: [phone]   Fax:  [phone]  Name: Jeffrey [name] MRN: [id number] Date of Birth: 02-17-1941

## 2015-04-20 NOTE — Therapy (Signed)
[facility] [address] [place], [place], [address] Phone: [phone]   Fax:  [phone]  Physical Therapy Treatment  Patient Details  Name: Jeffrey [name] MRN: [id number] Date of Birth: 1940-04-04 Referring Provider: Leonie Man  Encounter Date: 04/20/2015      PT End of Session - 04/20/15 1408    Visit Number 25   Number of Visits 33   Date for PT Re-Evaluation 05/13/15   Authorization Type Blue Medicare HMO-G- code every 10th visit   PT Start Time 1402   PT Stop Time 1445   PT Time Calculation (min) 43 min   Activity Tolerance Patient tolerated treatment well   Behavior During Therapy [facility] for tasks assessed/performed      Past Medical History  Diagnosis Date  . CAD (coronary artery disease)     PCI distal RCA ...2004, residual 70% LAD   /   ...nuclear...03/2007...no ischemia.Marland KitchenMarland Kitchenpreserved LV /  nuclear...03/03/2009...inferior scar..no ischemia..EF 51%  . Dyslipidemia     takes Atorvastatin daily  . Internal hemorrhoids   . Gout     takes Allopurinol daily and Colchicine as needed  . Seasonal allergic rhinitis   . Right carotid bruit   . Cyst of nasopharynx     per ENT Wilburn Cornelia  . Myocardial infarction [facility]) 62yr ago  . Peripheral edema     takes Lasix daily  . HTN (hypertension)     takes Cardura,Metoprolol,[name],and Amlodipine daily  . Arthritis   . GERD (gastroesophageal reflux disease)     takes Omeprazole daily  . History of colon polyps   . Type 2 diabetes, uncontrolled, with neuropathy ([place] 1989    takes Invokana daily and has an insulin pump ([name]  . 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 HCAP (healthcare-associated pneumonia)   . Hemiparesis affecting right side as late effect of cerebrovascular accident ([facility] 09/05/2014  . Anterior cerebral circulation hemorrhagic infarction ([facility] 11/24/2014    March, 2016, dominant left thalamic and  left internal capsule ischemic infarct with resultant hemorrhagic transformation, secondary to small vessel disease. Resulted in right hemiparesis dysarthria and diplopia   //   readmission with aphasia July, 2016, this improved     Past Surgical History  Procedure Laterality Date  . Cataract extraction Bilateral 2010  . Elbow surgery Right 1997  . Balloon angioplasty, artery  1992, 2004    CAD, Dr. KRon Parker . Colonoscopy  04/03/2002    adenomatous polyp, int hemorrhoids  . Colonoscopy  07/18/2007    normal (Dr. SFuller Plan  . Rotator cuff repair  09/2011    left, with subacromial decompression  . Nasal septum surgery    . Colonoscopy  09/26/2012    tubular adenoma, sm int hem, rpt 5 yrs (Fuller Plan  . Tonsillectomy and adenoidectomy    . Eye lids raised    . Cardiac catheterization  2004  . Polypectomy N/A 05/27/2013    Procedure: ENDOSCOPIC NASOPHARYNGEAL MASS;  Surgeon: DJerrell Belfast MD    There were no vitals filed for this visit.  Visit Diagnosis:  Unsteadiness  Ataxia  Abnormality of gait  Decreased coordination      Subjective Assessment - 04/20/15 1407    Subjective No changes since last visit, no falls.     Pertinent History Blood glucose may drop when pt has 2 therapies in a row. Pt's family packs bag of snacks to eat if pt feels hypoglycemic.   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   Currently in Pain? No/denies             NMR:  Worked on dynamic standing balance at counter top shifting weight from midline to the R while holding ball to increase BUE support via closed chain activity.  Note that pt tends to stand with increased lumbar lordosis as to rely on ligamentous and bony structures for support as well as leaning on counter top to rely on support.  Heavy cues for increased elbow extension and  decreased shoulder elevation during activity as this tends to cause pt to weight shift to the L.  Transitioned to performing similar activity in free standing (not in front of counter) for increased postural challenge.  Provided mirror for increased visual feedback on posture during remaining activities.  Had pt work on placing RLE slightly ahead of LLE in order to work on forward R weight shift to carry over to gait.  Requires max verbal cues and tactile cues for adequate weight shift and to decrease RUE and trunk compensations.  Progressed to more fine motor task in RUE, however note marked increased in difficulty shifting to the R and increased R shoulder elevation.  Ended session with sit<>stand with R weight shift while reaching with self assist (LUE assisting RUE) for cones.  Increased cues and assist initially to shift over RLE, however progressed very well during session.                     PT Education - 04/20/15 1408    Education provided Yes   Education Details importance of HEP compliance, beginning to work on standing weight shifting at [employer]) Educated Patient   Methods Explanation   Comprehension Verbalized understanding          PT Short Term Goals - 02/03/15 1324    PT SHORT TERM GOAL #1   Title Pt will perform HEP with family assistance/supervision, for lower extremitiy strengthening.  TARGET 02/06/15   Time 4   Period Weeks   Status Achieved   PT SHORT TERM GOAL #2   Title Pt will perform sit<>stand transfers with supervision, for improved efficiency and safety with transfers.   Baseline Met 11/22.   Time 4   Period Weeks   Status Achieved   PT SHORT TERM GOAL #3   Title Pt will improve TUG score to less than or equal to 70 seconds for decreased fall risk.   Baseline 52.62 seconds with R AFO and heel wedge;73 seconds without AFO   Time 4   Period Weeks   Status Achieved   PT SHORT TERM GOAL #4   Title Pt will ambulate at least 150 ft  using least restrictive assistive device with minimal assistance for improved gait efficiency.   Baseline Met 11/22.   Time 4   Period Weeks   Status Achieved   PT SHORT TERM GOAL #5   Title Pt will verbalize understanding of CVA education.   Time 4   Period Weeks   Status Achieved           PT Long Term Goals - 04/13/15 1411    PT LONG TERM GOAL #1   Title Pt will verbalize understanding of fall prevention within home environment.  Modified TARGET 04/07/15  Baseline met 04/13/15   Time 4   Period Weeks   Status Achieved   PT LONG TERM GOAL #2   Title Pt will improve Timed Up and GO score to < 25 seconds for decreased fall risk.   Baseline 28.88 on 04/13/15  Feel that gait speed no longer safe goal due to incoordination   Time 4   Period Weeks   Status Deferred   PT LONG TERM GOAL #3   Title Pt will improve gait velocity to 1.94 ft/sec for improved gait efficiency and safety.   Baseline 1.33 ft/sec on 04/13/15  feel that gait speed goal no longer safe due to incoordination   Time 4   Period Weeks   Status Deferred   PT LONG TERM GOAL #4   Title Pt will improve BERG balance score to 43/56 in order to indicate decreased fall risk. (Modified Target date: 05/11/15)   Baseline 39/56 on 04/13/15  updated due to goal met on 04/13/15   Time 4   Period Weeks   Status Revised   PT LONG TERM GOAL #5   Title Pt will ambulate >300' using least restrictive assistive device at mod I level for improved efficiency and safety with gait.   Baseline met 04/13/15 (indoor surfaces)    Time 4   Status Achieved   PT LONG TERM GOAL #6   Title Pt will verbalize plans for continued community fitness upon D/C from PT. 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  (Target Date: 05/11/15)   Status New    PT LONG TERM GOAL #8   Title Pt will demonstrate compensatory strategy for standing balance in order to relieve RUE for functional task in standing. (Target Date: 05/11/15)   Status New               Plan - 04/20/15 1409    Clinical Impression Statement Skilled session continues to focus on standing balance using BUE in closed chain support while shifting over RLE with decreased compensatory trunk movements.     Pt will benefit from skilled therapeutic intervention in order to improve on the following deficits Abnormal gait;Decreased balance;Decreased mobility;Decreased coordination;Decreased strength;Difficulty walking;Impaired sensation;Impaired tone   Rehab Potential Good   PT Frequency 2x / week   PT Duration 4 weeks   PT Treatment/Interventions ADLs/Self Care Home Management;Therapeutic exercise;Therapeutic activities;Functional mobility training;Gait training;Balance training;Neuromuscular re-education;Electrical Stimulation   PT Next Visit Plan Continue to address RLE WB, NMR and coordination, gait w/ SPC, consider adding small controlled movements with single "anchor" so that he has to increase trunk control (RUE with LUE, etc), increasing proximal control   Consulted and Agree with Plan of Care Patient        Problem List Patient Active Problem List   Diagnosis Date Noted  . Ear pain 03/23/2015  . Anterior cerebral circulation hemorrhagic infarction ([place]) 11/24/2014  . Hemiparesis affecting right side as late effect of cerebrovascular accident ([place]) 09/05/2014  . Depression due to stroke ([place]) 09/05/2014  . Thrombocytopenia ([place]) 06/05/2014  . Chronic diastolic heart failure, NYHA class 1 ([place]) 06/05/2014  . Ex-smoker 06/03/2014  . Benign paroxysmal positional vertigo 04/28/2014  . Right carotid bruit   . Polyneuropathy, diabetic ([place]) 08/05/2012  . Seasonal allergic  rhinitis   . Trigger thumb of left hand 10/12/2011  . Gout   . Adenomatous polyps   . CAD  (coronary artery disease)   . Essential hypertension   . Hyperlipidemia   . Type 2 diabetes, uncontrolled, with neuropathy ([place])   . Ejection fraction     Cameron Sprang, PT, MPT [facility] [address] [place], [place], [address] Phone: [phone]   Fax:  [phone] 04/21/2015, 8:38 AM  Name: LEOVANNI BJORKMAN MRN: [id number] Date of Birth: [birth year]

## 2015-04-23 ENCOUNTER — Ambulatory Visit: Payer: PPO | Admitting: Occupational Therapy

## 2015-04-23 ENCOUNTER — Ambulatory Visit: Payer: PPO | Admitting: Physical Therapy

## 2015-04-23 DIAGNOSIS — R2689 Other abnormalities of gait and mobility: Secondary | ICD-10-CM

## 2015-04-23 DIAGNOSIS — M25611 Stiffness of right shoulder, not elsewhere classified: Secondary | ICD-10-CM

## 2015-04-23 DIAGNOSIS — R270 Ataxia, unspecified: Secondary | ICD-10-CM

## 2015-04-23 DIAGNOSIS — R278 Other lack of coordination: Secondary | ICD-10-CM

## 2015-04-23 DIAGNOSIS — R269 Unspecified abnormalities of gait and mobility: Secondary | ICD-10-CM

## 2015-04-23 DIAGNOSIS — G8191 Hemiplegia, unspecified affecting right dominant side: Secondary | ICD-10-CM | POA: Diagnosis not present

## 2015-04-23 DIAGNOSIS — R279 Unspecified lack of coordination: Secondary | ICD-10-CM

## 2015-04-23 NOTE — Therapy (Signed)
[facility] [address] [place], [place], [address] Phone: [phone]   Fax:  [phone]  Occupational Therapy Treatment  Patient Details  Name: Jeffrey [name] MRN: [id number] Date of Birth: 07-21-1940 Referring Provider: Dr. Leonie Man  Encounter Date: 04/23/2015      OT End of Session - 04/23/15 1304    Visit Number 25   Number of Visits 32   Date for OT Re-Evaluation 05/09/15   Authorization Type Blue Medicare HMO, no visit limit/no auth, G-code   Authorization Time Period renewal completed 07/11/24, cert period 2/0/35-07/13/72,BU 3/8   Authorization - Visit Number 25   Authorization - Number of Visits 30   OT Start Time 1018   OT Stop Time 1100   OT Time Calculation (min) 42 min   Activity Tolerance Patient tolerated treatment well   Behavior During Therapy [facility] for tasks assessed/performed      Past Medical History  Diagnosis Date  . CAD (coronary artery disease)     PCI distal RCA ...2004, residual 70% LAD   /   ...nuclear...03/2007...no ischemia.Marland KitchenMarland Kitchenpreserved LV /  nuclear...03/03/2009...inferior scar..no ischemia..EF 51%  . Dyslipidemia     takes Atorvastatin daily  . Internal hemorrhoids   . Gout     takes Allopurinol daily and Colchicine as needed  . Seasonal allergic rhinitis   . Right carotid bruit   . Cyst of nasopharynx     per ENT Wilburn Cornelia  . Myocardial infarction [facility]) 63yr ago  . Peripheral edema     takes Lasix daily  . HTN (hypertension)     takes Cardura,Metoprolol,[name],and Amlodipine daily  . Arthritis   . GERD (gastroesophageal reflux disease)     takes Omeprazole daily  . History of colon polyps   . Type 2 diabetes, uncontrolled, with neuropathy ([place] 1989    takes Invokana daily and has an insulin pump ([name]  . 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 HCAP (healthcare-associated pneumonia)   . Hemiparesis affecting right  side as late effect of [place]vascular accident ([facility] 09/05/2014  . Anterior cerebral circulation hemorrhagic infarction ([facility] 11/24/2014    March, 2016, dominant left thalamic and left internal capsule ischemic infarct with resultant hemorrhagic transformation, secondary to small vessel disease. Resulted in right hemiparesis dysarthria and diplopia   //   readmission with aphasia July, 2016, this improved     Past Surgical History  Procedure Laterality Date  . Cataract extraction Bilateral 2010  . Elbow surgery Right 1997  . Balloon angioplasty, artery  1992, 2004    CAD, Dr. KRon Parker . Colonoscopy  04/03/2002    adenomatous polyp, int hemorrhoids  . Colonoscopy  07/18/2007    normal (Dr. SFuller Plan  . Rotator cuff repair  09/2011    left, with subacromial decompression  . Nasal septum surgery    . Colonoscopy  09/26/2012    tubular adenoma, sm int hem, rpt 5 yrs (Fuller Plan  . Tonsillectomy and adenoidectomy    . Eye lids raised    . Cardiac catheterization  2004  . Polypectomy N/A 05/27/2013    Procedure: ENDOSCOPIC NASOPHARYNGEAL MASS;  Surgeon: DJerrell Belfast MD    There were no vitals filed for this visit.  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 No/denies                      OT Treatments/Exercises (OP) - 04/23/15 0001    Neurological Re-education Exercises   Shoulder Flexion AAROM;Both;Supine  PVC frame mid-high level with min facilitation   Other Grasp and Release Exercises  In sitting, low range functional reaching to  grasp/release cylinder objects and 1-inch blocks with min facilitation for control with RUE.  Also performed using L hand facilitation with min cues for shoulder hike   Seated with weight on hand with arm on body movements for incr UE stability/control with min cues   Other Weight-Bearing Exercises 1 wt. bearing on elbow in sitting with tricep ext/push to sit for incr shoulder stability, neuro re-ed/control, min v.c.  Weightbearing through BUEs for partial sit>stand and then for sit>stand with RUE assist and then for stand>sit with RUE on leg   Reciprocal Movements Arm bike x666m level 1 with R hand wrapped for reciprocal movments with good control.     Attempted to trace simple shapes with forearm supported on table (trial of built-up grip and felt pen).  Pt did better with felt-tip pen, but continued to demo significant difficulty with ataxia.               OT Short Term Goals - 04/20/15 1719    OT SHORT TERM GOAL #3   Title Pt will demo at least 55* R shoulder flex with min compensation in prep for functional reach.   Baseline 45*   Time 4   Period Weeks   Status On-going  03/10/15:  50* with min-mod compensation/cueing; 04/13/15  50-55* with min-mod compensation inconsistent   OT SHORT TERM GOAL #5   Title Pt will be able to grasp/release cylinder object independently using good control in 7/10 attempts without drops.--check 04/11/15   Time 4   Period Weeks   Status On-going  04/13/15:  not consistent           OT Long Term Goals - 04/23/15 1307    OT LONG TERM GOAL #1   Title Pt will be independent with updated HEP.--check LTGs 05/09/15   Time 8   Period Weeks   Status On-going  03/12/15:  not fully met/ongoing--continue for renewal period   OT LONG TERM GOAL #3   Title Pt will demo at least 60* R shoulder flex with min compensation in prep for functional reach.   Baseline 45*   Time 8   Period Weeks   Status Revised  03/10/15:  50* with min-mod compensation/cueing; revised  03/12/15   OT LONG TERM GOAL #4   Title Pt will improve RUE functional reaching/coordination for ADLs as shown by improving score on box and blocks test to at least 15 blocks.--continue for renewal, check 05/09/15   Baseline 9 blocks   Time 8   Period Weeks   Status On-going  03/12/15:  10 blocks   OT LONG TERM GOAL #7   Title Pt will be able to grasp/release cylinder object independently using good control in 9/10 attempts without drops.--check 04/11/15   Baseline 8   Period Weeks   Status New   OT LONG TERM GOAL #8   Title  Pt will be able to write name with RUE with at least 75% legiblity using AE prn.   Time 8   Period Weeks   Status Deferred  04/23/15  decr control, max difficulty, not met.               Plan - 04/23/15 1305    Clinical Impression Statement Pt continues to make slow progress with incr AAROM with less compensation, but decr control/ataxia with open chain reaching limits RUE functional use   Plan neuro re-ed, functional reaching/functional use of RUE    OT Home Exercise Plan Education provided:  initial HEP issued; 03/03/15 memory compensations, cognitive/visual HEP, coordination HEP issued; wt. bearing on elbow with push to sitting   Consulted and Agree with Plan of Care Patient   Family Member Consulted wife        Problem List Patient Active Problem List   Diagnosis Date Noted  . Ear pain 03/23/2015  . Anterior cerebral circulation hemorrhagic infarction ([place]) 11/24/2014  . Hemiparesis affecting right side as late effect of cerebrovascular accident ([place]) 09/05/2014  . Depression due to stroke ([place]) 09/05/2014  . Thrombocytopenia ([place]) 06/05/2014  . Chronic diastolic heart failure, NYHA class 1 ([place]) 06/05/2014  . Ex-smoker 06/03/2014  . Benign paroxysmal positional vertigo 04/28/2014  . Right carotid bruit   . Polyneuropathy, diabetic ([place]) 08/05/2012  . Seasonal allergic rhinitis   . Trigger thumb of left hand 10/12/2011  . Gout   . Adenomatous  polyps   . CAD (coronary artery disease)   . Essential hypertension   . Hyperlipidemia   . Type 2 diabetes, uncontrolled, with neuropathy ([place])   . Ejection fraction     [facility] 04/23/2015, 1:16 PM  [facility] [address] [place], [place], [address] Phone: [phone]   Fax:  [phone]  Name: Jeffrey [name] MRN: [id number] Date of Birth: 02-24-1941  Vianne Bulls, OTR/L [facility] [address] [place], [place]  [address] [phone] phone [phone] 04/23/2015 1:16 PM

## 2015-04-23 NOTE — Therapy (Signed)
[facility] [address] [place], [place], [address] Phone: [phone]   Fax:  [phone]  Physical Therapy Treatment  Patient Details  Name: Jeffrey [name] MRN: [id number] Date of Birth: [birth year] Referring Provider: Leonie Man  Encounter Date: 04/23/2015      PT End of Session - 04/23/15 1233    Visit Number 26   Number of Visits 33   Date for PT Re-Evaluation 05/13/15   Authorization Type Blue Medicare HMO-G- code every 10th visit   PT Start Time 1105   PT Stop Time 1151   PT Time Calculation (min) 46 min   Equipment Utilized During Treatment Gait belt   Activity Tolerance Patient tolerated treatment well   Behavior During Therapy [facility] for tasks assessed/performed      Past Medical History  Diagnosis Date  . CAD (coronary artery disease)     PCI distal RCA ...2004, residual 70% LAD   /   ...nuclear...03/2007...no ischemia.Marland KitchenMarland Kitchenpreserved LV /  nuclear...03/03/2009...inferior scar..no ischemia..EF 51%  . Dyslipidemia     takes Atorvastatin daily  . Internal hemorrhoids   . Gout     takes Allopurinol daily and Colchicine as needed  . Seasonal allergic rhinitis   . Right carotid bruit   . Cyst of nasopharynx     per ENT Wilburn Cornelia  . Myocardial infarction [facility]) 26yr ago  . Peripheral edema     takes Lasix daily  . HTN (hypertension)     takes Cardura,Metoprolol,[name],and Amlodipine daily  . Arthritis   . GERD (gastroesophageal reflux disease)     takes Omeprazole daily  . History of colon polyps   . Type 2 diabetes, uncontrolled, with neuropathy ([place] 1989    takes Invokana daily and has an insulin pump ([name]  . 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 HCAP (healthcare-associated pneumonia)   . Hemiparesis affecting right side as late effect of cerebrovascular accident ([place] 09/05/2014  . Anterior cerebral circulation hemorrhagic infarction ([facility]  11/24/2014    March, 2016, dominant left thalamic and left internal capsule ischemic infarct with resultant hemorrhagic transformation, secondary to small vessel disease. Resulted in right hemiparesis dysarthria and diplopia   //   readmission with aphasia July, 2016, this improved     Past Surgical History  Procedure Laterality Date  . Cataract extraction Bilateral 2010  . Elbow surgery Right 1997  . Balloon angioplasty, artery  1992, 2004    CAD, Dr. KRon Parker . Colonoscopy  04/03/2002    adenomatous polyp, int hemorrhoids  . Colonoscopy  07/18/2007    normal (Dr. SFuller Plan  . Rotator cuff repair  09/2011    left, with subacromial decompression  . Nasal septum surgery    . Colonoscopy  09/26/2012    tubular adenoma, sm int hem, rpt 5 yrs (Fuller Plan  . Tonsillectomy and adenoidectomy    . Eye lids raised    . Cardiac catheterization  2004  . Polypectomy N/A 05/27/2013    Procedure: ENDOSCOPIC NASOPHARYNGEAL MASS;  Surgeon: DJerrell Belfast MD    There were no vitals filed for this visit.  Visit Diagnosis:  Abnormality of gait  Right hemiparesis ([place]  Ataxia      Subjective Assessment - 04/23/15 1110    Subjective "Same old same old. 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 Reports some soreness in LE's today. Pt denies falls and reports no pain.    Pertinent  History Blood glucose may drop when pt has 2 therapies in a row. Pt's family packs bag of snacks to eat if pt feels hypoglycemic.   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   Currently in Pain? No/denies   Multiple Pain Sites No                         [facility] Adult PT Treatment/Exercise - 04/23/15 0001    Ambulation/Gait   Ambulation/Gait Yes   Ambulation/Gait Assistance 4: Min guard;4: Min assist    Ambulation/Gait Assistance Details 5 x115'; initial trial without AD with min A, cueing for anterior/lateral weight shifting to R side (emphasis during LLE advancement). Noted R shoulder internally rotated, extended) limiting anterolateral weight shift to R side. Subsequent gait trials with SPC with cueing for weight shifting, grading RLE step length, increased LLE step length (to normalize weight shift). Attempted to utilize Tband for resistive "sling" at RUE to increase sensory input/awareness to improve RUE placement and normalize weight shift; RUE remained in R elbow flexion, slight R shoulder flexion but weight shift improved. Also attempted gait with SPC in R hand using R h/o; however, pt with increased difficulty placing SPC, maintaining SPC on ground during LLE advancement. During final 115' trial, pt performed gait with SPC with R hand in pocket. Did note improvement in R anterolateral weight shift; however, pt required 50-75% cueing to maintain palm of R hand on anterolateral R thigh (in pt's pocket).    Ambulation Distance (Feet) 575 Feet   Assistive device Straight cane;Other (Comment)  SPC with quad tip   Gait Pattern Step-through pattern;Decreased stance time - right;Decreased step length - left;Decreased weight shift to right;Ataxic;Step-to pattern;Decreased arm swing - right   Ambulation Surface Level;Indoor   Pre-Gait Activities RUE at countertop, forward/retro gait 4 x10' per direction with min A, cueing to slide R hand anterior/laterally on countertop during R stance. Required manual assist to maintain R hand in contact with countertop.   Gait Comments Attempted pre-gait activity with R hand on red physioball on mat table to stabilize RUE and decrease excessive trunk movement due to RUE movement during gait; attepmted x3 trials prior to ending activity due to physioball falling into floor, as pt unable to                   PT Short Term Goals - 02/03/15 1324    PT SHORT TERM  GOAL #1   Title Pt will perform HEP with family assistance/supervision, for lower extremitiy strengthening.  TARGET 02/06/15   Time 4   Period Weeks   Status Achieved   PT SHORT TERM GOAL #2   Title Pt will perform sit<>stand transfers with supervision, for improved efficiency and safety with transfers.   Baseline Met 11/22.   Time 4   Period Weeks   Status Achieved   PT SHORT TERM GOAL #3   Title Pt will improve TUG score to less than or equal to 70 seconds for decreased fall risk.   Baseline 52.62 seconds with R AFO and heel wedge;73 seconds without AFO   Time 4   Period Weeks   Status Achieved   PT SHORT TERM GOAL #4   Title Pt will ambulate at least 150 ft using least restrictive assistive device with minimal assistance  for improved gait efficiency.   Baseline Met 11/22.   Time 4   Period Weeks   Status Achieved   PT SHORT TERM GOAL #5   Title Pt will verbalize understanding of CVA education.   Time 4   Period Weeks   Status Achieved           PT Long Term Goals - 04/13/15 1411    PT LONG TERM GOAL #1   Title Pt will verbalize understanding of fall prevention within home environment.  Modified TARGET 04/07/15   Baseline met 04/13/15   Time 4   Period Weeks   Status Achieved   PT LONG TERM GOAL #2   Title Pt will improve Timed Up and GO score to < 25 seconds for decreased fall risk.   Baseline 28.88 on 04/13/15  Feel that gait speed no longer safe goal due to incoordination   Time 4   Period Weeks   Status Deferred   PT LONG TERM GOAL #3   Title Pt will improve gait velocity to 1.94 ft/sec for improved gait efficiency and safety.   Baseline 1.33 ft/sec on 04/13/15  feel that gait speed goal no longer safe due to incoordination   Time 4   Period Weeks   Status Deferred   PT LONG TERM GOAL #4   Title Pt will improve BERG balance score to 43/56 in order to indicate decreased fall risk. (Modified Target date: 05/11/15)   Baseline 39/56 on 04/13/15  updated due to goal met  on 04/13/15   Time 4   Period Weeks   Status Revised   PT LONG TERM GOAL #5   Title Pt will ambulate >300' using least restrictive assistive device at mod I level for improved efficiency and safety with gait.   Baseline met 04/13/15 (indoor surfaces)    Time 4   Status Achieved   PT LONG TERM GOAL #6   Title Pt will verbalize plans for continued community fitness upon D/C from PT. 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  (Target Date: 05/11/15)   Status New   PT LONG TERM GOAL #8   Title Pt will demonstrate compensatory strategy for standing balance in order to relieve RUE for functional task in standing. (Target Date: 05/11/15)   Status New               Plan - 04/23/15 1236    Clinical Impression Statement Session focused on gait training with emphasis on improving RUE control/positioning to promote R anterolateral weight shift during gait training with SPC. Noted improved RUE control and more normalized weight shift when R hand in pocket. Pt planning to wear jeans to next session to better stabilize R hand in pocket.    Pt will benefit from skilled therapeutic intervention in order to improve on the following deficits Abnormal gait;Decreased balance;Decreased mobility;Decreased coordination;Decreased strength;Difficulty walking;Impaired sensation;Impaired tone   Rehab Potential Good   PT Frequency 2x / week   PT Duration 4 weeks   PT Treatment/Interventions ADLs/Self Care Home Management;Therapeutic exercise;Therapeutic activities;Functional mobility training;Gait training;Balance training;Neuromuscular re-education;Electrical Stimulation   PT Next Visit Plan NMR for coordination, RLE WB, gait w/ SPC (R hand in pocket). May trial gait with R forearm crutch (if  you think  appropriate?). Consider adding small controlled movements with single "anchor" so that he has to increase trunk control (RUE with LUE, etc), increasing proximal controlrpo   Consulted and Agree with Plan of Care Patient        Problem List Patient Active Problem List   Diagnosis Date Noted  . Ear pain 03/23/2015  . Anterior cerebral circulation hemorrhagic infarction ([place]) 11/24/2014  . Hemiparesis affecting right side as late effect of cerebrovascular accident ([place]) 09/05/2014  . Depression due to stroke ([place]) 09/05/2014  . Thrombocytopenia ([place]) 06/05/2014  . Chronic diastolic heart failure, NYHA class 1 ([place]) 06/05/2014  . Ex-smoker 06/03/2014  . Benign paroxysmal positional vertigo 04/28/2014  . Right carotid bruit   . Polyneuropathy, diabetic ([place]) 08/05/2012  . Seasonal allergic rhinitis   . Trigger thumb of left hand 10/12/2011  . Gout   . Adenomatous polyps   . CAD (coronary artery disease)   . Essential hypertension   . Hyperlipidemia   . Type 2 diabetes, uncontrolled, with neuropathy ([place])   . Ejection fraction     Billie Ruddy, PT, DPT [facility] [address] [place], [place], [address] Phone: [phone]   Fax:  [phone] 04/23/2015, 12:46 PM  Name: Jeffrey [name] MRN: [id number] Date of Birth: March 18, 1940

## 2015-04-28 ENCOUNTER — Ambulatory Visit: Payer: PPO | Admitting: Occupational Therapy

## 2015-04-28 ENCOUNTER — Ambulatory Visit: Payer: PPO | Admitting: Physical Therapy

## 2015-04-28 DIAGNOSIS — R278 Other lack of coordination: Secondary | ICD-10-CM

## 2015-04-28 DIAGNOSIS — R279 Unspecified lack of coordination: Secondary | ICD-10-CM

## 2015-04-28 DIAGNOSIS — G8191 Hemiplegia, unspecified affecting right dominant side: Secondary | ICD-10-CM

## 2015-04-28 DIAGNOSIS — R269 Unspecified abnormalities of gait and mobility: Secondary | ICD-10-CM

## 2015-04-28 DIAGNOSIS — R2689 Other abnormalities of gait and mobility: Secondary | ICD-10-CM

## 2015-04-28 DIAGNOSIS — M25611 Stiffness of right shoulder, not elsewhere classified: Secondary | ICD-10-CM

## 2015-04-28 DIAGNOSIS — R270 Ataxia, unspecified: Secondary | ICD-10-CM

## 2015-04-28 NOTE — Therapy (Signed)
[facility] [address] [place], [place], [address] Phone: [phone]   Fax:  [phone]  Physical Therapy Treatment  Patient Details  Name: Jeffrey [name] MRN: [id number] Date of Birth: 07-Sep-1940 Referring Provider: Leonie Man  Encounter Date: 04/28/2015      PT End of Session - 04/28/15 1314    Visit Number 27   Number of Visits 33   Date for PT Re-Evaluation 05/13/15   Authorization Type Blue Medicare HMO-G- code every 10th visit   PT Start Time 1103   PT Stop Time 1145   PT Time Calculation (min) 42 min   Equipment Utilized During Treatment Gait belt   Activity Tolerance Patient tolerated treatment well   Behavior During Therapy WFL for tasks assessed/performed      Past Medical History  Diagnosis Date  . CAD (coronary artery disease)     PCI distal RCA ...2004, residual 70% LAD   /   ...nuclear...03/2007...no ischemia.Marland KitchenMarland Kitchenpreserved LV /  nuclear...03/03/2009...inferior scar..no ischemia..EF 51%  . Dyslipidemia     takes Atorvastatin daily  . Internal hemorrhoids   . Gout     takes Allopurinol daily and Colchicine as needed  . Seasonal allergic rhinitis   . Right carotid bruit   . Cyst of nasopharynx     per ENT Wilburn Cornelia  . Myocardial infarction [facility]) 79yr ago  . Peripheral edema     takes Lasix daily  . HTN (hypertension)     takes Cardura,Metoprolol,[name],and Amlodipine daily  . Arthritis   . GERD (gastroesophageal reflux disease)     takes Omeprazole daily  . History of colon polyps   . Type 2 diabetes, uncontrolled, with neuropathy ([place] 1989    takes Invokana daily and has an insulin pump ([name]  . 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 HCAP (healthcare-associated pneumonia)   . Hemiparesis affecting right side as late effect of cerebrovascular accident ([place] 09/05/2014  . Anterior cerebral circulation hemorrhagic infarction ([facility]  11/24/2014    March, 2016, dominant left thalamic and left internal capsule ischemic infarct with resultant hemorrhagic transformation, secondary to small vessel disease. Resulted in right hemiparesis dysarthria and diplopia   //   readmission with aphasia July, 2016, this improved     Past Surgical History  Procedure Laterality Date  . Cataract extraction Bilateral 2010  . Elbow surgery Right 1997  . Balloon angioplasty, artery  1992, 2004    CAD, Dr. KRon Parker . Colonoscopy  04/03/2002    adenomatous polyp, int hemorrhoids  . Colonoscopy  07/18/2007    normal (Dr. SFuller Plan  . Rotator cuff repair  09/2011    left, with subacromial decompression  . Nasal septum surgery    . Colonoscopy  09/26/2012    tubular adenoma, sm int hem, rpt 5 yrs (Fuller Plan  . Tonsillectomy and adenoidectomy    . Eye lids raised    . Cardiac catheterization  2004  . Polypectomy N/A 05/27/2013    Procedure: ENDOSCOPIC NASOPHARYNGEAL MASS;  Surgeon: DJerrell Belfast MD    There were no vitals filed for this visit.  Visit Diagnosis:  Abnormality of gait  Right hemiparesis ([place]  Ataxia  Decreased coordination      Subjective Assessment - 04/28/15 1304    Subjective "I don't really get a lot of time on my own. The girls keep a close watch on me to make sure I don't get hurt. I'd like to walk outside  more."   Pertinent History Blood glucose may drop when pt has 2 therapies in a row. Pt's family packs bag of snacks to eat if pt feels hypoglycemic.   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   Currently in Pain? No/denies                         [facility] Adult PT Treatment/Exercise - 04/28/15 0001    Ambulation/Gait   Ambulation/Gait Yes   Ambulation/Gait Assistance 6: Modified independent (Device/Increase time);4: Min assist    Ambulation/Gait Assistance Details Gait x75' with RW, R hand orthosis, with cueing for decrased RLE step length, increased LLE step length to promote step-through pattern and increase lateral weight shift to R sicde. Gait [phone]' with L forearm crutch with min guard - min A, cueing for sequencing; R hand stabilized by gait belt; noted limited lateral weight shift to R side. Gait x35' with B forearm crutches; gait trial ended due to significant pt difficulty placing R forearm crutch effectively. Gait x50' with B forearm crutches, 2# cuff weight on R forearm crutch with noted improvement in placement/control of crutch, but decreased efficiency of ambulation due to pt need for constant attention to R crutch placement. Performed final gait trial x150' with L forearm crutch with cueing for increased lateral weight shift to R side with inconsistent carryover.   Ambulation Distance (Feet) 425 Feet   Assistive device L Forearm Crutch;R Forearm Crutch   Gait Pattern Step-through pattern;Decreased stance time - right;Decreased step length - left;Decreased weight shift to right;Ataxic;Step-to pattern;Decreased arm swing - right   Ambulation Surface Level;Indoor   Door Management 3: Mod assist   Door Managment Details (indicate cue type and reason) heavy, swinging door with pt using RW.                  PT Short Term Goals - 02/03/15 1324    PT SHORT TERM GOAL #1   Title Pt will perform HEP with family assistance/supervision, for lower extremitiy strengthening.  TARGET 02/06/15   Time 4   Period Weeks   Status Achieved   PT SHORT TERM GOAL #2   Title Pt will perform sit<>stand transfers with supervision, for improved efficiency and safety with transfers.   Baseline Met 11/22.   Time 4   Period Weeks   Status Achieved   PT SHORT TERM GOAL #3   Title Pt will improve TUG score to less than or equal to 70 seconds for decreased fall risk.   Baseline 52.62 seconds with R AFO and heel wedge;73 seconds  without AFO   Time 4   Period Weeks   Status Achieved   PT SHORT TERM GOAL #4   Title Pt will ambulate at least 150 ft using least restrictive assistive device with minimal assistance for improved gait efficiency.   Baseline Met 11/22.   Time 4   Period Weeks   Status Achieved   PT SHORT TERM GOAL #5   Title Pt will verbalize understanding of CVA education.   Time 4   Period Weeks   Status Achieved           PT Long Term Goals - 04/13/15 1411    PT LONG TERM GOAL #1   Title Pt will verbalize understanding of fall prevention within  home environment.  Modified TARGET 04/07/15   Baseline met 04/13/15   Time 4   Period Weeks   Status Achieved   PT LONG TERM GOAL #2   Title Pt will improve Timed Up and GO score to < 25 seconds for decreased fall risk.   Baseline 28.88 on 04/13/15  Feel that gait speed no longer safe goal due to incoordination   Time 4   Period Weeks   Status Deferred   PT LONG TERM GOAL #3   Title Pt will improve gait velocity to 1.94 ft/sec for improved gait efficiency and safety.   Baseline 1.33 ft/sec on 04/13/15  feel that gait speed goal no longer safe due to incoordination   Time 4   Period Weeks   Status Deferred   PT LONG TERM GOAL #4   Title Pt will improve BERG balance score to 43/56 in order to indicate decreased fall risk. (Modified Target date: 05/11/15)   Baseline 39/56 on 04/13/15  updated due to goal met on 04/13/15   Time 4   Period Weeks   Status Revised   PT LONG TERM GOAL #5   Title Pt will ambulate >300' using least restrictive assistive device at mod I level for improved efficiency and safety with gait.   Baseline met 04/13/15 (indoor surfaces)    Time 4   Status Achieved   PT LONG TERM GOAL #6   Title Pt will verbalize plans for continued community fitness upon D/C from PT. 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  (Target Date: 05/11/15)   Status New   PT LONG TERM GOAL #8   Title Pt will demonstrate compensatory strategy for standing balance in order to relieve RUE for functional task in standing. (Target Date: 05/11/15)   Status New               Plan - 04/28/15 1314    Clinical Impression Statement Session focused on attempting use of forearm crutches during gait training. Bilat forearm crutches not safe/functional option at this time due to pt difficulty controlling R crutch placement with RUE. No significant difference noted during gait with L forearm crutch as compared with single point cane during current session. However, pt did report slight preference of L forearm crutch due to increased perception of stability.   Pt will benefit from skilled therapeutic intervention in order to improve on the following deficits Abnormal gait;Decreased balance;Decreased mobility;Decreased coordination;Decreased strength;Difficulty walking;Impaired sensation;Impaired tone   Rehab Potential Good   PT Frequency 2x / week   PT Duration 4 weeks   PT Treatment/Interventions ADLs/Self Care Home Management;Therapeutic exercise;Therapeutic activities;Functional mobility training;Gait training;Balance training;Neuromuscular re-education;Electrical Stimulation   PT Next Visit Plan Community mobility, curb step negotiation with RW and R hand orthosis. Consider renewal for additonal 4 weeks.   Consulted and Agree with Plan of Care Patient        Problem List Patient Active Problem List   Diagnosis Date Noted  . Ear pain 03/23/2015  . Anterior cerebral circulation hemorrhagic infarction ([place]) 11/24/2014  . Hemiparesis affecting right side as late effect of cerebrovascular accident ([place]) 09/05/2014  . Depression due to stroke ([place]) 09/05/2014  . Thrombocytopenia ([place]) 06/05/2014  . Chronic diastolic heart failure, NYHA  class 1  ([place]) 06/05/2014  . Ex-smoker 06/03/2014  . Benign paroxysmal positional vertigo 04/28/2014  . Right carotid bruit   . Polyneuropathy, diabetic ([place]) 08/05/2012  . Seasonal allergic rhinitis   . Trigger thumb of left hand 10/12/2011  . Gout   . Adenomatous polyps   . CAD (coronary artery disease)   . Essential hypertension   . Hyperlipidemia   . Type 2 diabetes, uncontrolled, with neuropathy ([place])   . Ejection fraction     Billie Ruddy, PT, DPT [facility] [address] [place], [place], [address] Phone: [phone]   Fax:  [phone] 04/28/2015, 5:03 PM   Name: Jeffrey [name] MRN: [id number] Date of Birth: 04-Oct-1940

## 2015-04-28 NOTE — Therapy (Signed)
[place] [address] [place], [place], [address] Phone: [phone]   Fax:  [phone]  Occupational Therapy Treatment  Patient Details  Name: Jeffrey [name] MRN: [id number] Date of Birth: 04-20-1940 Referring Provider: Dr. Leonie Man  Encounter Date: 04/28/2015      OT End of Session - 04/28/15 1024    Visit Number 25   Number of Visits 32   Date for OT Re-Evaluation 05/09/15   Authorization Type Blue Medicare HMO, no visit limit/no auth, G-code   Authorization Time Period renewal completed 11/05/61, cert period 10/08/64-07/14/91,TT 3/8   Authorization - Visit Number 25   Authorization - Number of Visits 30   OT Start Time 1019   OT Stop Time 1100   OT Time Calculation (min) 41 min   Activity Tolerance Patient tolerated treatment well   Behavior During Therapy [facility] for tasks assessed/performed      Past Medical History  Diagnosis Date  . CAD (coronary artery disease)     PCI distal RCA ...2004, residual 70% LAD   /   ...nuclear...03/2007...no ischemia.Marland KitchenMarland Kitchenpreserved LV /  nuclear...03/03/2009...inferior scar..no ischemia..EF 51%  . Dyslipidemia     takes Atorvastatin daily  . Internal hemorrhoids   . Gout     takes Allopurinol daily and Colchicine as needed  . Seasonal allergic rhinitis   . Right carotid bruit   . Cyst of nasopharynx     per ENT Wilburn Cornelia  . Myocardial infarction [facility]) 86yr ago  . Peripheral edema     takes Lasix daily  . HTN (hypertension)     takes Cardura,Metoprolol,[name],and Amlodipine daily  . Arthritis   . GERD (gastroesophageal reflux disease)     takes Omeprazole daily  . History of colon polyps   . Type 2 diabetes, uncontrolled, with neuropathy [place] [employer] 1989    takes Invokana daily and has an insulin pump ([name]  . 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 HCAP (healthcare-associated pneumonia)   . Hemiparesis affecting right  side as late effect of [place]vascular [place] ([place] 09/[place]  . Anterior cerebral circulation hemorrhagic infarction ([facility] 11/24/2014    March, 2016, dominant left thalamic and left internal capsule ischemic infarct with resultant hemorrhagic transformation, secondary to small vessel disease. Resulted in right hemiparesis dysarthria and diplopia   //   readmission with aphasia July, 2016, this improved     Past Surgical History  Procedure Laterality Date  . Cataract extraction Bilateral 2010  . Elbow surgery Right 1997  . Balloon angioplasty, artery  1992, 2004    CAD, Dr. KRon Parker . Colonoscopy  04/03/2002    adenomatous polyp, int hemorrhoids  . Colonoscopy  07/18/2007    normal (Dr. SFuller Plan  . Rotator cuff repair  09/2011    left, with subacromial decompression  . Nasal septum surgery    . Colonoscopy  09/26/2012    tubular adenoma, sm int hem, rpt 5 yrs (Fuller Plan  . Tonsillectomy and adenoidectomy    . Eye lids raised    . Cardiac catheterization  2004  . Polypectomy N/A 05/27/2013    Procedure: ENDOSCOPIC NASOPHARYNGEAL MASS;  Surgeon: DJerrell Belfast MD    There were no vitals filed for this visit.  Visit Diagnosis:  Right hemiparesis ([place]  Ataxia  Decreased coordination  Stiffness of right shoulder joint  Decreased functional mobility      Subjective Assessment - 04/28/15 1358    Subjective  Pt reports that  he feels that therapy has helped a lot   Pertinent History see epic   Patient Stated Goals utilization of right arm "I wish I had more control of it", feel comfortable being around people   Currently in Pain? No/denies          OT Treatments/Exercises (OP) - 04/23/15 0001    Neurological Re-education Exercises   Shoulder Flexion AAROM;Both;Supine/sitting PVC frame mid-high level with min facilitation  Chest press in supine with PVC frame with min cues  In standing, mid-high range shoulder flex with UE ranger with min facilitation/cues.   Other  Grasp and Release Exercises  In sitting, low range functional reaching to grasp/release cylinder objects on tabletop with RUE with min cues for compensation and min-mod difficulty which improved with repetition.        Other Weight-Bearing Exercises 1 Wt. Bearing through both elbows in prone with chest/head lift for incr scapular depression.      Scapular exercises in prone with shoulders in extension (retraction and depression) with min facilitation.    Shoulder flex AROM to 55-60* with min compensation                               OT Education - 04/28/15 1358    Education Details importance of HEP compliance; progress; will plan to transition to d/c (take a break from therapy after next renewal for 4 weeks).   Person(s) Educated Patient   Methods Explanation   Comprehension Verbalized understanding          OT Short Term Goals - 04/28/15 1043    OT SHORT TERM GOAL #3   Title Pt will demo at least 55* R shoulder flex with min compensation in prep for functional reach.   Baseline 45*   Time 4   Period Weeks   Status Achieved  03/10/15:  50* with min-mod compensation/cueing; 04/13/15  50-55* with min-mod compensation inconsistent; 04/28/15  55-60* with min compensation   OT SHORT TERM GOAL #5   Title Pt will be able to grasp/release cylinder object independently using good control in 7/10 attempts without drops.--check 04/11/15   Time 4   Period Weeks   Status On-going  04/13/15:  not consistent           OT Long Term Goals - 04/28/15 1424    OT LONG TERM GOAL #1   Title Pt will be independent with updated HEP.--check LTGs 05/09/15   Time 8   Period Weeks   Status On-going  03/12/15:  not fully met/ongoing--continue for renewal period   OT LONG TERM GOAL #3   Title Pt will demo at least 60* R shoulder flex with min compensation in prep for functional reach.   Baseline 45*   Time 8   Period Weeks   Status Revised  03/10/15:  50* with min-mod  compensation/cueing; revised 03/12/15   OT LONG TERM GOAL #4   Title Pt will improve RUE functional reaching/coordination for ADLs as shown by improving score on box and blocks test to at least 15 blocks.--continue for renewal, check 05/09/15   Baseline 9 blocks   Time 8   Period Weeks   Status On-going  03/12/15:  10 blocks   OT LONG TERM GOAL #7   Title Pt will be able to grasp/release cylinder object independently using good control in 9/10 attempts without drops.--check 04/11/15   Baseline 8   Period Weeks   Status New  Plan - 04/28/15 1400    Clinical Impression Statement Pt continues to make slow progress and demo improved control with AAROM and improved active shoulder ROM with less compensation.  Open chain reaching with RUE remains difficulty with inconsistent performance at times.   Clinical Impairments Affecting Rehab Potential severity of deficits, sensory deficits, ataxia   Plan neuro re-ed, functional reaching/functional use of RUE   OT Home Exercise Plan Education provided:  initial HEP issued; 03/03/15 memory compensations, cognitive/visual HEP, coordination HEP issued; wt. bearing on elbow with push to sitting   Consulted and Agree with Plan of Care Patient        Problem List Patient Active Problem List   Diagnosis Date Noted  . Ear pain 03/23/2015  . Anterior cerebral circulation hemorrhagic infarction ([place]) 11/24/2014  . Hemiparesis affecting right side as late effect of cerebrovascular accident ([place]) 09/05/2014  . Depression due to stroke ([place]) 09/05/2014  . Thrombocytopenia ([place]) 06/05/2014  . Chronic diastolic heart failure, NYHA class 1 ([place]) 06/05/2014  . Ex-smoker 06/03/2014  . Benign paroxysmal positional vertigo 04/28/2014  . Right carotid bruit   . Polyneuropathy, diabetic ([place]) 08/05/2012  . Seasonal allergic rhinitis   . Trigger thumb of left hand 10/12/2011  . Gout   . Adenomatous polyps   . CAD (coronary artery disease)   .  Essential hypertension   . Hyperlipidemia   . Type 2 diabetes, uncontrolled, with neuropathy ([place])   . Ejection fraction     [facility] 04/28/2015, 2:25 PM  [place] [address] [place], [place], [address] Phone: [phone]   Fax:  [phone]  Name: Jeffrey [name] MRN: [id number] Date of Birth: 1940/08/21  Vianne Bulls, OTR/L [facility] [address] [place], [place]  [address] [phone] phone [phone] 04/28/2015 2:25 PM

## 2015-04-30 ENCOUNTER — Ambulatory Visit: Payer: PPO | Admitting: Physical Therapy

## 2015-04-30 ENCOUNTER — Ambulatory Visit: Payer: PPO | Admitting: Occupational Therapy

## 2015-04-30 DIAGNOSIS — M25611 Stiffness of right shoulder, not elsewhere classified: Secondary | ICD-10-CM

## 2015-04-30 DIAGNOSIS — R2681 Unsteadiness on feet: Secondary | ICD-10-CM

## 2015-04-30 DIAGNOSIS — R269 Unspecified abnormalities of gait and mobility: Secondary | ICD-10-CM

## 2015-04-30 DIAGNOSIS — R270 Ataxia, unspecified: Secondary | ICD-10-CM

## 2015-04-30 DIAGNOSIS — G8191 Hemiplegia, unspecified affecting right dominant side: Secondary | ICD-10-CM

## 2015-04-30 DIAGNOSIS — R279 Unspecified lack of coordination: Secondary | ICD-10-CM

## 2015-04-30 DIAGNOSIS — R278 Other lack of coordination: Secondary | ICD-10-CM

## 2015-04-30 NOTE — Therapy (Signed)
[place] [address] [place], [place], [address] Phone: [phone]   Fax:  [phone]  Physical Therapy Treatment  Patient Details  Name: Jeffrey [name] MRN: [id number] Date of Birth: March 30, 1940 Referring Provider: Leonie Man  Encounter Date: 04/30/2015      PT End of Session - 04/30/15 1905    Visit Number 28   Number of Visits 33   Date for PT Re-Evaluation 05/13/15   Authorization Type Blue Medicare HMO-G- code every 10th visit   PT Start Time 1102   PT Stop Time 1145   PT Time Calculation (min) 43 min   Equipment Utilized During Treatment Gait belt   Activity Tolerance Patient tolerated treatment well   Behavior During Therapy [facility] for tasks assessed/performed      Past Medical History  Diagnosis Date  . CAD (coronary artery disease)     PCI distal RCA ...2004, residual 70% LAD   /   ...nuclear...03/2007...no ischemia.Marland KitchenMarland Kitchenpreserved LV /  nuclear...03/03/2009...inferior scar..no ischemia..EF 51%  . Dyslipidemia     takes Atorvastatin daily  . Internal hemorrhoids   . Gout     takes Allopurinol daily and Colchicine as needed  . Seasonal allergic rhinitis   . Right carotid bruit   . Cyst of nasopharynx     per ENT Wilburn Cornelia  . Myocardial infarction [facility]) 66yr ago  . Peripheral edema     takes Lasix daily  . HTN (hypertension)     takes Cardura,Metoprolol,[name],and Amlodipine daily  . Arthritis   . GERD (gastroesophageal reflux disease)     takes Omeprazole daily  . History of colon polyps   . Type 2 diabetes, uncontrolled, with neuropathy ([place] 1989    takes Invokana daily and has an insulin pump ([name]  . 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 HCAP (healthcare-associated pneumonia)   . Hemiparesis affecting right side as late effect of cerebrovascular accident ([place] 09/05/2014  . Anterior cerebral circulation hemorrhagic infarction ([facility]  11/24/2014    March, 2016, dominant left thalamic and left internal capsule ischemic infarct with resultant hemorrhagic transformation, secondary to small vessel disease. Resulted in right hemiparesis dysarthria and diplopia   //   readmission with aphasia July, 2016, this improved     Past Surgical History  Procedure Laterality Date  . Cataract extraction Bilateral 2010  . Elbow surgery Right 1997  . Balloon angioplasty, artery  1992, 2004    CAD, Dr. KRon Parker . Colonoscopy  04/03/2002    adenomatous polyp, int hemorrhoids  . Colonoscopy  07/18/2007    normal (Dr. SFuller Plan  . Rotator cuff repair  09/2011    left, with subacromial decompression  . Nasal septum surgery    . Colonoscopy  09/26/2012    tubular adenoma, sm int hem, rpt 5 yrs (Fuller Plan  . Tonsillectomy and adenoidectomy    . Eye lids raised    . Cardiac catheterization  2004  . Polypectomy N/A 05/27/2013    Procedure: ENDOSCOPIC NASOPHARYNGEAL MASS;  Surgeon: DJerrell Belfast MD    There were no vitals filed for this visit.  Visit Diagnosis:  Abnormality of gait  Right hemiparesis ([place]  Ataxia  Unsteadiness      Subjective Assessment - 04/30/15 1100    Subjective "Same old, same old," per pt. Reports no significant changes, no falls.   Pertinent History Blood glucose may drop when pt has 2 therapies in a row. Pt's family packs bag  of snacks to eat if pt feels hypoglycemic.   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   Currently in Pain? No/denies                         [facility] Adult PT Treatment/Exercise - 04/30/15 0001    Ambulation/Gait   Ambulation/Gait Yes   Ambulation/Gait Assistance 6: Modified independent (Device/Increase time);5: Supervision;4: Min assist;3: Mod assist;4: Min guard   Ambulation/Gait Assistance Details Gait x150'  with RW, R h/o, and mod I over level, indoor surfaces. Gait with SPC x35' with min A. Gait with L forearm crutch x145 then 115' (short seated rest break between trials) with min guard, up to mod A for intermittent LOB due to pt difficulty controlling anterior weight shift during LLE advancement. When using SPC and L forearm crutch, R hand tucked into gait belt to stabilize RUE and promote more normalized weight shift. Noted increased stability with L forearm crutch as compared with SPC..   Ambulation Distance (Feet) 445 Feet   Assistive device L Forearm Crutch;Rolling walker;Straight cane   Gait Pattern Step-through pattern;Decreased stance time - right;Decreased step length - left;Decreased weight shift to right;Ataxic;Step-to pattern;Decreased arm swing - right   Ambulation Surface Level;Indoor   Ramp 5: Supervision   Ramp Details (indicate cue type and reason) cueing for hand placement to control descent  using RW   Curb 4: Min assist   Curb Details (indicate cue type and reason) x3 trials with min A , 50% cueing for technique during initial trial. Subsequent trials with min guard and effective carryover of cueing.  using RW   Pre-Gait Activities 3 x20' trials with L forearm crutch with mirror anterior to pt for visual feedback, increased awareness of midline.                  PT Short Term Goals - 02/03/15 1324    PT SHORT TERM GOAL #1   Title Pt will perform HEP with family assistance/supervision, for lower extremitiy strengthening.  TARGET 02/06/15   Time 4   Period Weeks   Status Achieved   PT SHORT TERM GOAL #2   Title Pt will perform sit<>stand transfers with supervision, for improved efficiency and safety with transfers.   Baseline Met 11/22.   Time 4   Period Weeks   Status Achieved   PT SHORT TERM GOAL #3   Title Pt will improve TUG score to less than or equal to 70 seconds for decreased fall risk.   Baseline 52.62 seconds with R AFO and heel wedge;73 seconds without  AFO   Time 4   Period Weeks   Status Achieved   PT SHORT TERM GOAL #4   Title Pt will ambulate at least 150 ft using least restrictive assistive device with minimal assistance for improved gait efficiency.   Baseline Met 11/22.   Time 4   Period Weeks   Status Achieved   PT SHORT TERM GOAL #5   Title Pt will verbalize understanding of CVA education.   Time 4   Period Weeks   Status Achieved           PT Long Term Goals - 04/13/15 1411    PT LONG TERM GOAL #1   Title Pt will verbalize understanding of fall prevention within  home environment.  Modified TARGET 04/07/15   Baseline met 04/13/15   Time 4   Period Weeks   Status Achieved   PT LONG TERM GOAL #2   Title Pt will improve Timed Up and GO score to < 25 seconds for decreased fall risk.   Baseline 28.88 on 04/13/15  Feel that gait speed no longer safe goal due to incoordination   Time 4   Period Weeks   Status Deferred   PT LONG TERM GOAL #3   Title Pt will improve gait velocity to 1.94 ft/sec for improved gait efficiency and safety.   Baseline 1.33 ft/sec on 04/13/15  feel that gait speed goal no longer safe due to incoordination   Time 4   Period Weeks   Status Deferred   PT LONG TERM GOAL #4   Title Pt will improve BERG balance score to 43/56 in order to indicate decreased fall risk. (Modified Target date: 05/11/15)   Baseline 39/56 on 04/13/15  updated due to goal met on 04/13/15   Time 4   Period Weeks   Status Revised   PT LONG TERM GOAL #5   Title Pt will ambulate >300' using least restrictive assistive device at mod I level for improved efficiency and safety with gait.   Baseline met 04/13/15 (indoor surfaces)    Time 4   Status Achieved   PT LONG TERM GOAL #6   Title Pt will verbalize plans for continued community fitness upon D/C from PT. 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  (Target Date: 05/11/15)   Status New   PT LONG TERM GOAL #8   Title Pt will demonstrate compensatory strategy for standing balance in order to relieve RUE for functional task in standing. (Target Date: 05/11/15)   Status New               Plan - 04/30/15 1906    Clinical Impression Statement Session focused on continued gait training with L forearm crutch with emphasis on functional weight shifting, midline orientation. Pt requires min guard to mod A (to recover from intermittent LOB) for gait with forearm crutch. Although use of crutch does challenge postural control, question if this is a realistic/functional option for mobility at this time.    Pt will benefit from skilled therapeutic intervention in order to improve on the following deficits Abnormal gait;Decreased balance;Decreased mobility;Decreased coordination;Decreased strength;Difficulty walking;Impaired sensation;Impaired tone   Rehab Potential Good   PT Frequency 2x / week   PT Duration 4 weeks   PT Treatment/Interventions ADLs/Self Care Home Management;Therapeutic exercise;Therapeutic activities;Functional mobility training;Gait training;Balance training;Neuromuscular re-education;Electrical Stimulation   PT Next Visit Plan Community mobility, curb step negotiation with RW and R hand orthosis. Talk to patient about renewal vs. DC after 3/8 (Sorry, Em; haven't seen pt since you and I discussed this). Will need to cancel PT appts beyond 3/8, if decision to to DC. Patient expressed desire to be more independent walking outdoors with RW; may want to focus on this, if you think appropriate.   Consulted and Agree with Plan of Care Patient        Problem List Patient Active Problem List   Diagnosis Date Noted  . Ear pain 03/23/2015  . Anterior cerebral  circulation hemorrhagic infarction ([place]) 11/24/2014  . Hemiparesis affecting right side as late  effect of cerebrovascular accident ([place]) 09/05/2014  . Depression due to stroke ([place]) 09/05/2014  . Thrombocytopenia ([place]) 06/05/2014  . Chronic diastolic heart failure, NYHA class 1 ([place]) 06/05/2014  . Ex-smoker 06/03/2014  . Benign paroxysmal positional vertigo 04/28/2014  . Right carotid bruit   . Polyneuropathy, diabetic ([place]) 08/05/2012  . Seasonal allergic rhinitis   . Trigger thumb of left hand 10/12/2011  . Gout   . Adenomatous polyps   . CAD (coronary artery disease)   . Essential hypertension   . Hyperlipidemia   . Type 2 diabetes, uncontrolled, with neuropathy ([place])   . Ejection fraction     Billie Ruddy, PT, DPT [facility] [address] [place], [place], [address] Phone: [phone]   Fax:  [phone] 04/30/2015, 7:14 PM  Name: Jeffrey [name] MRN: [id number] Date of Birth: 1940-09-24

## 2015-04-30 NOTE — Therapy (Signed)
[place] [address] [place], [place], [address] Phone: [phone]   Fax:  [phone]  Occupational Therapy Treatment  Patient Details  Name: Jeffrey [name] MRN: [id number] Date of Birth: 1940/03/08 Referring Provider: Dr. Leonie Man  Encounter Date: 04/30/2015      OT End of Session - 04/30/15 1306    Visit Number 26   Number of Visits 32   Date for OT Re-Evaluation 05/09/15   Authorization Type [employer] HMO, no visit limit/no auth, G-code   Authorization Time Period renewal completed 09/07/92, cert period 06/13/65-07/14/14,BW 7/8   Authorization - Visit Number 27   Authorization - Number of Visits 30   OT Start Time 1016   OT Stop Time 1100   OT Time Calculation (min) 44 min   Activity Tolerance Patient tolerated treatment well   Behavior During Therapy [facility] for tasks assessed/performed      Past Medical History  Diagnosis Date  . CAD (coronary artery disease)     PCI distal RCA ...2004, residual 70% LAD   /   ...nuclear...03/2007...no ischemia.Marland KitchenMarland Kitchenpreserved LV /  nuclear...03/03/2009...inferior scar..no ischemia..EF 51%  . Dyslipidemia     takes Atorvastatin daily  . Internal hemorrhoids   . Gout     takes Allopurinol daily and Colchicine as needed  . Seasonal allergic rhinitis   . Right carotid bruit   . Cyst of nasopharynx     per ENT Wilburn Cornelia  . Myocardial infarction [facility]) 66yr ago  . Peripheral edema     takes Lasix daily  . HTN (hypertension)     takes Cardura,Metoprolol,[name],and Amlodipine daily  . Arthritis   . GERD (gastroesophageal reflux disease)     takes Omeprazole daily  . History of colon polyps   . Type 2 diabetes, uncontrolled, with neuropathy ([place] 1989    takes Invokana daily and has an insulin pump ([name]  . 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 HCAP (healthcare-associated pneumonia)   . Hemiparesis affecting right  side as late effect of cerebrovascular accident ([place] 09/05/2014  . Anterior cerebral circulation hemorrhagic infarction ([facility] 11/24/2014    March, 2016, dominant left thalamic and left internal capsule ischemic infarct with resultant hemorrhagic transformation, secondary to small vessel disease. Resulted in right hemiparesis dysarthria and diplopia   //   readmission with aphasia July, 2016, this improved     Past Surgical History  Procedure Laterality Date  . Cataract extraction Bilateral 2010  . Elbow surgery Right 1997  . Balloon angioplasty, artery  1992, 2004    CAD, Dr. KRon Parker . Colonoscopy  04/03/2002    adenomatous polyp, int hemorrhoids  . Colonoscopy  07/18/2007    normal (Dr. SFuller Plan  . Rotator cuff repair  09/2011    left, with subacromial decompression  . Nasal septum surgery    . Colonoscopy  09/26/2012    tubular adenoma, sm int hem, rpt 5 yrs (Fuller Plan  . Tonsillectomy and adenoidectomy    . Eye lids raised    . Cardiac catheterization  2004  . Polypectomy N/A 05/27/2013    Procedure: ENDOSCOPIC NASOPHARYNGEAL MASS;  Surgeon: DJerrell Belfast MD    There were no vitals filed for this visit.  Visit Diagnosis:  Right hemiparesis ([place]  Ataxia  Decreased coordination  Stiffness of right shoulder joint      Subjective Assessment - 04/30/15 1305    Subjective  Pt reports that he worked on picking  up bottles at home   Patient is accompained by: Family member   Pertinent History see epic   Patient Stated Goals utilization of right arm "I wish I had more control of it", feel comfortable being around people   Currently in Pain? No/denies          OT Treatments/Exercises (OP) - 04/23/15 0001    Neurological Re-education Exercises   Shoulder Flexion AAROM;Both;Supine PVC frame mid-high level  In sitting, mid-high range shoulder flex with UE ranger with min facilitation/cues.   Other Grasp and Release Exercises  In sitting, low range functional  reaching to grasp/release cylinder objects on tabletop with RUE with min cues for compensation and min-mod difficulty which improved with repetition.        Other Weight-Bearing Exercises 1 Wt. Bearing through both elbows in prone with chest/head lift for incr scapular depression.  Wt. Bearing on hand in sitting with body on arm movements with min cues.      Scapular exercises in prone with shoulders in extension (retraction and depression) with min facilitation.                                       OT Short Term Goals - 04/30/15 1310    OT SHORT TERM GOAL #3   Title Pt will demo at least 55* R shoulder flex with min compensation in prep for functional reach.   Baseline 45*   Time 4   Period Weeks   Status Achieved  03/10/15:  50* with min-mod compensation/cueing; 04/13/15  50-55* with min-mod compensation inconsistent; 04/28/15  55-60* with min compensation   OT SHORT TERM GOAL #5   Title Pt will be able to grasp/release cylinder object independently using good control in 7/10 attempts without drops.--check 04/11/15   Time 4   Period Weeks   Status Achieved  04/13/15:  not consistent; 04/30/15 met along tabletop at approx this level.           OT Long Term Goals - 04/28/15 1424    OT LONG TERM GOAL #1   Title Pt will be independent with updated HEP.--check LTGs 05/09/15   Time 8   Period Weeks   Status On-going  03/12/15:  not fully met/ongoing--continue for renewal period   OT LONG TERM GOAL #3   Title Pt will demo at least 60* R shoulder flex with min compensation in prep for functional reach.   Baseline 45*   Time 8   Period Weeks   Status Revised  03/10/15:  50* with min-mod compensation/cueing; revised 03/12/15   OT LONG TERM GOAL #4   Title Pt will improve RUE functional reaching/coordination for ADLs as shown by improving score on box and blocks test to at least 15 blocks.--continue for renewal, check 05/09/15   Baseline 9 blocks   Time 8    Period Weeks   Status On-going  03/12/15:  10 blocks   OT LONG TERM GOAL #7   Title Pt will be able to grasp/release cylinder object independently using good control in 9/10 attempts without drops.--check 04/11/15   Baseline 8   Period Weeks   Status New               Plan - 04/30/15 1308    Clinical Impression Statement Pt continues to make slow progress and demo incr AAROM without compensation and incr ability to grasp objects with forarm along  tabletop.   Plan neuro re-ed, functional reaching/use    OT Home Exercise Plan Education provided:  initial HEP issued; 03/03/15 memory compensations, cognitive/visual HEP, coordination HEP issued; wt. bearing on elbow with push to sitting   Consulted and Agree with Plan of Care Patient        Problem List Patient Active Problem List   Diagnosis Date Noted  . Ear pain 03/23/2015  . Anterior cerebral circulation hemorrhagic infarction ([place]) 11/24/2014  . Hemiparesis affecting right side as late effect of cerebrovascular accident ([place]) 09/05/2014  . Depression due to stroke ([place]) 09/05/2014  . Thrombocytopenia ([place]) 06/05/2014  . Chronic diastolic heart failure, NYHA class 1 ([place]) 06/05/2014  . Ex-smoker 06/03/2014  . Benign paroxysmal positional vertigo 04/28/2014  . Right carotid bruit   . Polyneuropathy, diabetic ([place]) 08/05/2012  . Seasonal allergic rhinitis   . Trigger thumb of left hand 10/12/2011  . Gout   . Adenomatous polyps   . CAD (coronary artery disease)   . Essential hypertension   . Hyperlipidemia   . Type 2 diabetes, uncontrolled, with neuropathy ([place])   . Ejection fraction     [facility] 04/30/2015, 1:12 PM  [place] [address] [place], [place], [address] Phone: [phone]   Fax:  [phone]  Name: JASSON SIEGMANN MRN: [id number] Date of Birth: 01-11-1941  Vianne Bulls, OTR/L [facility] [address] [place], [place]  [address] [phone] phone [phone] 04/30/2015 1:12 PM

## 2015-05-01 DIAGNOSIS — R600 Localized edema: Secondary | ICD-10-CM | POA: Diagnosis not present

## 2015-05-01 DIAGNOSIS — I69322 Dysarthria following cerebral infarction: Secondary | ICD-10-CM | POA: Diagnosis not present

## 2015-05-01 DIAGNOSIS — R278 Other lack of coordination: Secondary | ICD-10-CM | POA: Diagnosis not present

## 2015-05-01 DIAGNOSIS — R261 Paralytic gait: Secondary | ICD-10-CM | POA: Diagnosis not present

## 2015-05-01 DIAGNOSIS — M6281 Muscle weakness (generalized): Secondary | ICD-10-CM | POA: Diagnosis not present

## 2015-05-01 DIAGNOSIS — R279 Unspecified lack of coordination: Secondary | ICD-10-CM | POA: Diagnosis not present

## 2015-05-01 DIAGNOSIS — I639 Cerebral infarction, unspecified: Secondary | ICD-10-CM | POA: Diagnosis not present

## 2015-05-04 ENCOUNTER — Ambulatory Visit: Payer: PPO | Admitting: Occupational Therapy

## 2015-05-04 ENCOUNTER — Ambulatory Visit: Payer: PPO | Admitting: Rehabilitation

## 2015-05-05 ENCOUNTER — Ambulatory Visit (INDEPENDENT_AMBULATORY_CARE_PROVIDER_SITE_OTHER): Payer: PPO | Admitting: Podiatry

## 2015-05-05 ENCOUNTER — Encounter: Payer: Self-pay | Admitting: Podiatry

## 2015-05-05 DIAGNOSIS — B351 Tinea unguium: Secondary | ICD-10-CM | POA: Diagnosis not present

## 2015-05-05 DIAGNOSIS — M79674 Pain in right toe(s): Secondary | ICD-10-CM | POA: Diagnosis not present

## 2015-05-05 DIAGNOSIS — M79675 Pain in left toe(s): Secondary | ICD-10-CM | POA: Diagnosis not present

## 2015-05-05 NOTE — Progress Notes (Signed)
Patient ID: Jeffrey [name], male   DOB: Dec 13, 1940, 75 y.o.   MRN: [id number]   Subjective: This patient presents again today complaining of ongoing discomfort in his toenails and walking wearing shoes and is requesting nail debridement Echo right toenail was partially torn with self treatment including antibiotic ointment  Objective: Patient's daughter and wife present and treatment room Patient transfers from wheelchair to treatment table The toenails are elongated, hypertrophic, deformed, discolored and tender direct palpation 6-10 The distal lateral second right toenail was not present with a eschar in the nailbed. There is no surrounding erythema, edema around second right toenail  Assessment: Traumatic partial nail avulsion second right toenail without clinical sign of infection Type II diabetic with a history of neuropathy Symptomatic onychomycoses 6-10 History of CVA  Plan: Debridement of toenails 10 mechanically and electrically without any bleeding Advised patient apply Vaseline and a Band-Aid second right toenail until skin looks normal  Reappoint  10 weeks

## 2015-05-05 NOTE — Patient Instructions (Signed)
Apply Vaseline and a Band-Aid to the second right toenail area until the scab falls off  Diabetes and Foot Care Diabetes may cause you to have problems because of poor blood supply (circulation) to your feet and legs. This may cause the skin on your feet to become thinner, break easier, and heal more slowly. Your skin may become dry, and the skin may peel and crack. You may also have nerve damage in your legs and feet causing decreased feeling in them. You may not notice minor injuries to your feet that could lead to infections or more serious problems. Taking care of your feet is one of the most important things you can do for yourself.  HOME CARE INSTRUCTIONS  Wear shoes at all times, even in the house. Do not go barefoot. Bare feet are easily injured.  Check your feet daily for blisters, cuts, and redness. If you cannot see the bottom of your feet, use a mirror or ask someone for help.  Wash your feet with warm water (do not use hot water) and mild soap. Then pat your feet and the areas between your toes until they are completely dry. Do not soak your feet as this can dry your skin.  Apply a moisturizing lotion or petroleum jelly (that does not contain alcohol and is unscented) to the skin on your feet and to dry, brittle toenails. Do not apply lotion between your toes.  Trim your toenails straight across. Do not dig under them or around the cuticle. File the edges of your nails with an emery board or nail file.  Do not cut corns or calluses or try to remove them with medicine.  Wear clean socks or stockings every day. Make sure they are not too tight. Do not wear knee-high stockings since they may decrease blood flow to your legs.  Wear shoes that fit properly and have enough cushioning. To break in new shoes, wear them for just a few hours a day. This prevents you from injuring your feet. Always look in your shoes before you put them on to be sure there are no objects inside.  Do not cross  your legs. This may decrease the blood flow to your feet.  If you find a minor scrape, cut, or break in the skin on your feet, keep it and the skin around it clean and dry. These areas may be cleansed with mild soap and water. Do not cleanse the area with peroxide, alcohol, or iodine.  When you remove an adhesive bandage, be sure not to damage the skin around it.  If you have a wound, look at it several times a day to make sure it is healing.  Do not use heating pads or hot water bottles. They may burn your skin. If you have lost feeling in your feet or legs, you may not know it is happening until it is too late.  Make sure your health care provider performs a complete foot exam at least annually or more often if you have foot problems. Report any cuts, sores, or bruises to your health care provider immediately. SEEK MEDICAL CARE IF:   You have an injury that is not healing.  You have cuts or breaks in the skin.  You have an ingrown nail.  You notice redness on your legs or feet.  You feel burning or tingling in your legs or feet.  You have pain or cramps in your legs and feet.  Your legs or feet are numb.  Your feet always feel cold. SEEK IMMEDIATE MEDICAL CARE IF:   There is increasing redness, swelling, or pain in or around a wound.  There is a red line that goes up your leg.  Pus is coming from a wound.  You develop a fever or as directed by your health care provider.  You notice a bad smell coming from an ulcer or wound.   This information is not intended to replace advice given to you by your health care provider. Make sure you discuss any questions you have with your health care provider.   Document Released: 02/19/2000 Document Revised: 10/24/2012 Document Reviewed: 07/31/2012 Elsevier Interactive Patient Education [employer].

## 2015-05-07 ENCOUNTER — Ambulatory Visit: Payer: PPO | Admitting: Occupational Therapy

## 2015-05-07 ENCOUNTER — Encounter: Payer: Self-pay | Admitting: Rehabilitation

## 2015-05-07 ENCOUNTER — Ambulatory Visit: Payer: PPO | Attending: Neurology | Admitting: Rehabilitation

## 2015-05-07 DIAGNOSIS — R279 Unspecified lack of coordination: Secondary | ICD-10-CM | POA: Insufficient documentation

## 2015-05-07 DIAGNOSIS — M25611 Stiffness of right shoulder, not elsewhere classified: Secondary | ICD-10-CM | POA: Diagnosis not present

## 2015-05-07 DIAGNOSIS — R270 Ataxia, unspecified: Secondary | ICD-10-CM

## 2015-05-07 DIAGNOSIS — R269 Unspecified abnormalities of gait and mobility: Secondary | ICD-10-CM | POA: Diagnosis not present

## 2015-05-07 DIAGNOSIS — I69351 Hemiplegia and hemiparesis following cerebral infarction affecting right dominant side: Secondary | ICD-10-CM | POA: Diagnosis not present

## 2015-05-07 DIAGNOSIS — R278 Other lack of coordination: Secondary | ICD-10-CM

## 2015-05-07 DIAGNOSIS — R2689 Other abnormalities of gait and mobility: Secondary | ICD-10-CM | POA: Diagnosis not present

## 2015-05-07 DIAGNOSIS — R2681 Unsteadiness on feet: Secondary | ICD-10-CM | POA: Diagnosis not present

## 2015-05-07 DIAGNOSIS — G8191 Hemiplegia, unspecified affecting right dominant side: Secondary | ICD-10-CM | POA: Insufficient documentation

## 2015-05-07 NOTE — Therapy (Signed)
[facility] [address] [place], [place], [address] Phone: [phone]   Fax:  [phone]  Occupational Therapy Treatment  Patient Details  Name: Jeffrey [name] MRN: [id number] Date of Birth: 01-Jan-1941 Referring Provider: Dr. Leonie Man  Encounter Date: 05/07/2015      OT End of Session - 05/07/15 1049    Visit Number 27   Number of Visits 35  27+8=35   Date for OT Re-Evaluation 05/09/15   Authorization Type Blue Medicare HMO, no visit limit/no auth, G-code   Authorization Time Period renewal completed 11/11/96, cert period 11/06/09-11/09/15, next wk will be 1/4   Authorization - Visit Number 1   Authorization - Number of Visits 10   OT Start Time 1024   OT Stop Time 1102   OT Time Calculation (min) 38 min   Activity Tolerance Patient tolerated treatment well   Behavior During Therapy [facility] for tasks assessed/performed      Past Medical History  Diagnosis Date  . CAD (coronary artery disease)     PCI distal RCA ...2004, residual 70% LAD   /   ...nuclear...03/2007...no ischemia.Marland KitchenMarland Kitchenpreserved LV /  nuclear...03/03/2009...inferior scar..no ischemia..EF 51%  . Dyslipidemia     takes Atorvastatin daily  . Internal hemorrhoids   . Gout     takes Allopurinol daily and Colchicine as needed  . Seasonal allergic rhinitis   . Right carotid bruit   . Cyst of nasopharynx     per ENT Wilburn Cornelia  . Myocardial infarction [facility]) 1yr ago  . Peripheral edema     takes Lasix daily  . HTN (hypertension)     takes Cardura,Metoprolol,[name],and Amlodipine daily  . Arthritis   . GERD (gastroesophageal reflux disease)     takes Omeprazole daily  . History of colon polyps   . Type 2 diabetes, uncontrolled, with neuropathy ([place] 1989    takes Invokana daily and has an insulin pump ([name]  . 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 HCAP (healthcare-associated pneumonia)   .  Hemiparesis affecting right side as late effect of cerebrovascular accident ([place] 09/05/2014  . Anterior cerebral circulation hemorrhagic infarction ([facility] 11/24/2014    March, 2016, dominant left thalamic and left internal capsule ischemic infarct with resultant hemorrhagic transformation, secondary to small vessel disease. Resulted in right hemiparesis dysarthria and diplopia   //   readmission with aphasia July, 2016, this improved     Past Surgical History  Procedure Laterality Date  . Cataract extraction Bilateral 2010  . Elbow surgery Right 1997  . Balloon angioplasty, artery  1992, 2004    CAD, Dr. KRon Parker . Colonoscopy  04/03/2002    adenomatous polyp, int hemorrhoids  . Colonoscopy  07/18/2007    normal (Dr. SFuller Plan  . Rotator cuff repair  09/2011    left, with subacromial decompression  . Nasal septum surgery    . Colonoscopy  09/26/2012    tubular adenoma, sm int hem, rpt 5 yrs (Fuller Plan  . Tonsillectomy and adenoidectomy    . Eye lids raised    . Cardiac catheterization  2004  . Polypectomy N/A 05/27/2013    Procedure: ENDOSCOPIC NASOPHARYNGEAL MASS;  Surgeon: DJerrell Belfast MD    There were no vitals filed for this visit.  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  Pt reports that he's trying to pick up things more.   Pertinent History see epic   Patient Stated Goals utilization of right arm "I wish I had more control of it", feel comfortable being around people   Currently in Pain? No/denies             OT Treatments/Exercises (OP) - 04/23/15 0001    Neurological Re-education Exercises   Shoulder Flexion   In sitting, mid-high range shoulder flex with UE ranger with min facilitation/cues.   Other Grasp and Release Exercises  In sitting, low  range functional reaching to grasp/release cylinder objects with forearm on tabletop with RUE with min cues for compensation and min-mod difficulty which improved with repetition.   Flipping large cards with mod difficulty initially then min difficulty with cues to slow down and extend all fingers       Other Weight-Bearing Exercises 1 Wt. Bearing through both elbows in prone with chest/head lift for incr scapular depression.       Scapular exercises in prone with shoulders in extension (retraction and depression) with min facilitation.   Arm bike x31mn with R hand wrapped for reciprocal movement with min cueing for compensation.                                       OT Short Term Goals - 04/30/15 1310    OT SHORT TERM GOAL #3   Title Pt will demo at least 55* R shoulder flex with min compensation in prep for functional reach.   Baseline 45*   Time 4   Period Weeks   Status Achieved  03/10/15:  50* with min-mod compensation/cueing; 04/13/15  50-55* with min-mod compensation inconsistent; 04/28/15  55-60* with min compensation   OT SHORT TERM GOAL #5   Title Pt will be able to grasp/release cylinder object independently using good control in 7/10 attempts without drops.--check 04/11/15   Time 4   Period Weeks   Status Achieved  04/13/15:  not consistent; 04/30/15 met along tabletop at approx this level.           OT Long Term Goals - 05/07/15 1053    OT LONG TERM GOAL #1   Title Pt will be independent with final HEP.--check LTGs 05/09/15   Time 8   Period Weeks   Status Revised  03/12/15:  not fully met/ongoing--continue for renewal period; 05/07/15 not performing consistently/updates ongoing   OT LONG TERM GOAL #3   Title Pt will demo at least 60* R shoulder flex with min compensation in prep for functional reach.   Baseline 45*   Time 8   Period Weeks   Status Revised  03/10/15:  50* with min-mod compensation/cueing; revised 03/12/15;   not fully met 55-60*   OT LONG TERM GOAL #4   Title Pt will improve RUE functional reaching/coordination for ADLs as shown by improving score on box and blocks test to at least 15 blocks.--continue for renewal, check 05/09/15   Baseline 9 blocks   Time 8   Period Weeks   Status On-going  03/12/15:  10 blocks;  05/07/15:  10 blocks   OT LONG TERM GOAL #7   Title Pt will be able to grasp/release cylinder object independently using good control in 9/10 attempts without drops with forearm supported on table.--check 04/11/15  Baseline 8   Period Weeks   Status On-going  06-04-2015:  8/10   OT LONG TERM GOAL #8   Title Pt will be able to write name with RUE with at least 75% legiblity using AE prn.   Status Deferred  not met/ unable               Plan - 06/04/15 1734    Clinical Impression Statement Pt continues to make progress with decr compensation and ability to grasp objects with forearm support.  Progress slower than anticipated due to severity of ataxia.  Pt met all STGs, but would benefit from continued occupational therapy to continue to address supported RUE functional use and continue towards revised LTGs.   Pt will benefit from skilled therapeutic intervention in order to improve on the following deficits (Retired) Decreased cognition;Decreased mobility;Decreased strength;Impaired vision/preception;Impaired UE functional use;Decreased knowledge of use of DME;Decreased balance;Impaired tone;Impaired sensation;Decreased coordination;Decreased range of motion   Rehab Potential Good   OT Frequency 2x / week   OT Duration 4 weeks  beginning next week; renewal completed June 04, 2015   OT Treatment/Interventions Self-care/ADL training;Therapeutic exercise;Functional Mobility Training;Patient/family education;Ultrasound;Neuromuscular education;Manual Therapy;Splinting;Therapeutic exercises;Cryotherapy;Parrafin;DME and/or AE instruction;Therapeutic activities;Cognitive  remediation/compensation;Visual/perceptual remediation/compensation;Passive range of motion;Moist Heat;Contrast Bath;Fluidtherapy;Electrical Stimulation   Plan neuro re-ed, functional reaching/use   OT Home Exercise Plan Education provided:  initial HEP issued; 03/03/15 memory compensations, cognitive/visual HEP, coordination HEP issued; wt. bearing on elbow with push to sitting   Consulted and Agree with Plan of Care Patient          G-Codes - 06-04-2015 1747    Functional Assessment Tool Used using RUE as a stabilizer, box and blocks=10,  able to grasp/release cylinder object on table in 8/10 trials without drops   Functional Limitation Carrying, moving and handling objects   Carrying, Moving and Handling Objects Current Status (A0762) At least 60 percent but less than 80 percent impaired, limited or restricted   Carrying, Moving and Handling Objects Goal Status (U6333) At least 40 percent but less than 60 percent impaired, limited or restricted     Occupational Therapy Progress Note  Dates of Reporting Period: 03/30/15 to 04-Jun-2015  Objective Reports of Subjective Statement: see above  Objective Measurements: see above  Goal Update: see above  Plan: see above  Reason Skilled Services are Required: see above    Problem List Patient Active Problem List   Diagnosis Date Noted  . Ear pain 03/23/2015  . Anterior cerebral circulation hemorrhagic infarction ([place]) 11/24/2014  . Hemiparesis affecting right side as late effect of cerebrovascular accident ([place]) 09/05/2014  . Depression due to stroke ([place]) 09/05/2014  . Thrombocytopenia ([place]) 06/05/2014  . Chronic diastolic heart failure, NYHA class 1 ([place]) 06/05/2014  . Ex-smoker 06/03/2014  . Benign paroxysmal positional vertigo 04/28/2014  . Right carotid bruit   . Polyneuropathy, diabetic ([place]) 08/05/2012  . Seasonal allergic rhinitis   . Trigger thumb of left hand 10/12/2011  . Gout   . Adenomatous polyps   . CAD (coronary  artery disease)   . Essential hypertension   . Hyperlipidemia   . Type 2 diabetes, uncontrolled, with neuropathy ([place])   . Ejection fraction     [facility] 2015/06/04, 5:50 PM  [place] [address] [place], [place], [address] Phone: [phone]   Fax:  [phone]  Name: Jeffrey [name] MRN: [id number] Date of Birth: February 14, 1941  Vianne Bulls, OTR/L [facility] [place], [place]  [phone] phone [phone] 05/07/2015 5:50 PM

## 2015-05-07 NOTE — Therapy (Signed)
[facility] [address] [place], [place], [address] Phone: [phone]   Fax:  [phone]  Physical Therapy Treatment  Patient Details  Name: Jeffrey [name] MRN: [id number] Date of Birth: 12-Oct-1940 Referring Provider: Leonie Man  Encounter Date: 05/07/2015      PT End of Session - 05/07/15 1105    Visit Number 29   Number of Visits 33   Date for PT Re-Evaluation 05/13/15   Authorization Type Blue Medicare HMO-G- code every 10th visit   PT Start Time 1100   PT Stop Time 1145   PT Time Calculation (min) 45 min   Equipment Utilized During Treatment Gait belt   Activity Tolerance Patient tolerated treatment well   Behavior During Therapy [employer] for tasks assessed/performed      Past Medical History  Diagnosis Date  . CAD (coronary artery disease)     PCI distal RCA ...2004, residual 70% LAD   /   ...nuclear...03/2007...no ischemia.Marland KitchenMarland Kitchenpreserved LV /  nuclear...03/03/2009...inferior scar..no ischemia..EF 51%  . Dyslipidemia     takes Atorvastatin daily  . Internal hemorrhoids   . Gout     takes Allopurinol daily and Colchicine as needed  . Seasonal allergic rhinitis   . Right carotid bruit   . Cyst of nasopharynx     per ENT Wilburn Cornelia  . Myocardial infarction [facility]) 52yr ago  . Peripheral edema     takes Lasix daily  . HTN (hypertension)     takes Cardura,Metoprolol,[name],and Amlodipine daily  . Arthritis   . GERD (gastroesophageal reflux disease)     takes Omeprazole daily  . History of colon polyps   . Type 2 diabetes, uncontrolled, with neuropathy ([place] 1989    takes Invokana daily and has an insulin pump ([name]  . 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 HCAP (healthcare-associated pneumonia)   . Hemiparesis affecting right side as late effect of [place]vascular accident ([place] 09/05[place]2014  . Anterior cerebral circulation hemorrhagic infarction ([facility]  11/24/2014    March, 2016, dominant left thalamic and left internal capsule ischemic infarct with resultant hemorrhagic transformation, secondary to small vessel disease. Resulted in right hemiparesis dysarthria and diplopia   //   readmission with aphasia July, 2016, this improved     Past Surgical History  Procedure Laterality Date  . Cataract extraction Bilateral 2010  . Elbow surgery Right 1997  . Balloon angioplasty, artery  1992, 2004    CAD, Dr. KRon Parker . Colonoscopy  04/03/2002    adenomatous polyp, int hemorrhoids  . Colonoscopy  07/18/2007    normal (Dr. SFuller Plan  . Rotator cuff repair  09/2011    left, with subacromial decompression  . Nasal septum surgery    . Colonoscopy  09/26/2012    tubular adenoma, sm int hem, rpt 5 yrs (Fuller Plan  . Tonsillectomy and adenoidectomy    . Eye lids raised    . Cardiac catheterization  2004  . Polypectomy N/A 05/27/2013    Procedure: ENDOSCOPIC NASOPHARYNGEAL MASS;  Surgeon: DJerrell Belfast MD    There were no vitals filed for this visit.  Visit Diagnosis:  Abnormality of gait  Ataxia  Unsteadiness  Decreased functional mobility      Subjective Assessment - 05/07/15 1103    Subjective "I'm just getting over this crud that I had."    Pertinent History Blood glucose may drop when pt has 2 therapies in a row. Pt's family packs bag of snacks  to eat if pt feels hypoglycemic.   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   Currently in Pain? No/denies            Gait:  Continue to address gait with quad tip cane (and R AFO).  Utilized gait belt for safety as well as support for RUE in relaxed position in order to avoid compensatory trunk and shoulder movements.  Performed 230' total at min/guard assist with min cues for upright posture as well as slightly increased weight  shift to the R.  Pt doing very well with this during session.  Discussed pt purchasing (or obtaining through the [place]) a quad tip cane in order to begin to work on this at home with assist from daughter with gait belt (for safety and to stabilize RUE).  Pt verbalized understanding.    NMR:  Continue to address R lateral weight shift as well as functional use of RUE during session.  While in squat position had pt work on shifting R and L to obtain and place objects with RUE (self assisted by LUE for increased success). Requires up to mod A for adequate weight shift to the R and note marked difficulty in ability to perform more than one grasp in a row.  Provided max cues for slower speed of reaching as well as continued breathing throughout.  Performed 7 total with seated rest breaks in between most reps due to difficulty maintaining balance and weight shift.    Self Care:  Discussed as mentioned before regarding ambulation with cane at home with daughter.  Family present at end of session, therefore discussed with them for improved carryover.  Also discussed D/C from PT following next visit to allow pt to continue to progress with mobility and increase distance and independence with RW as well as working at home with gait with quad tip cane.  Feel that biggest limitation is RUE deficits at this time and feel he would benefit from continued OT.  Discussed that if/when RUE functional mobility progresses, beginning PT again to continue to work towards mobility goals.  Pt and family verbalized understanding.                       PT Education - 05/07/15 1105    Education provided Yes   Education Details discussed D/C from PT on next visit, beginning to ambulate for exercise with cane at home (with quad tip and with daughter's assist).  Continue to encourage pt to not utilize w/c at all in the house.    Person(s) Educated Patient;Spouse;Child(ren)   Methods Explanation   Comprehension Verbalized  understanding          PT Short Term Goals - 02/03/15 1324    PT SHORT TERM GOAL #1   Title Pt will perform HEP with family assistance/supervision, for lower extremitiy strengthening.  TARGET 02/06/15   Time 4   Period Weeks   Status Achieved   PT SHORT TERM GOAL #2   Title Pt will perform sit<>stand transfers with supervision, for improved efficiency and safety with transfers.   Baseline Met 11/22.   Time 4   Period Weeks   Status Achieved   PT SHORT TERM GOAL #3   Title Pt will improve TUG score to less than or equal to 70 seconds  for decreased fall risk.   Baseline 52.62 seconds with R AFO and heel wedge;73 seconds without AFO   Time 4   Period Weeks   Status Achieved   PT SHORT TERM GOAL #4   Title Pt will ambulate at least 150 ft using least restrictive assistive device with minimal assistance for improved gait efficiency.   Baseline Met 11/22.   Time 4   Period Weeks   Status Achieved   PT SHORT TERM GOAL #5   Title Pt will verbalize understanding of CVA education.   Time 4   Period Weeks   Status Achieved           PT Long Term Goals - 04/13/15 1411    PT LONG TERM GOAL #1   Title Pt will verbalize understanding of fall prevention within home environment.  Modified TARGET 04/07/15   Baseline met 04/13/15   Time 4   Period Weeks   Status Achieved   PT LONG TERM GOAL #2   Title Pt will improve Timed Up and GO score to < 25 seconds for decreased fall risk.   Baseline 28.88 on 04/13/15  Feel that gait speed no longer safe goal due to incoordination   Time 4   Period Weeks   Status Deferred   PT LONG TERM GOAL #3   Title Pt will improve gait velocity to 1.94 ft/sec for improved gait efficiency and safety.   Baseline 1.33 ft/sec on 04/13/15  feel that gait speed goal no longer safe due to incoordination   Time 4   Period Weeks   Status Deferred   PT LONG TERM GOAL #4   Title Pt will improve BERG balance score to 43/56 in order to indicate decreased fall risk.  (Modified Target date: 05/11/15)   Baseline 39/56 on 04/13/15  updated due to goal met on 04/13/15   Time 4   Period Weeks   Status Revised   PT LONG TERM GOAL #5   Title Pt will ambulate >300' using least restrictive assistive device at mod I level for improved efficiency and safety with gait.   Baseline met 04/13/15 (indoor surfaces)    Time 4   Status Achieved   PT LONG TERM GOAL #6   Title Pt will verbalize plans for continued community fitness upon D/C from PT. 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  (Target Date: 05/11/15)   Status New   PT LONG TERM GOAL #8   Title Pt will demonstrate compensatory strategy for standing balance in order to relieve RUE for functional task in standing. (Target Date: 05/11/15)   Status New               Plan - 05/07/15 1201    Clinical Impression Statement Skilled session focused on gait with quad tip cane to continue to progress towards mobility goals.  Continue to address upright posture and adequate weight shifting during gait, but note marked improvement in ability to maintain midline orientation and improve R weight shift during R stance.  Educated pt and family on beginning to ambulate at home with this cane with assist from daughter, but also continuing to ambulate at home with RW as well as increasing community ambulation with RW.  Pt  and family verbalized understanding.    Pt will benefit from skilled therapeutic intervention in order to improve on the following deficits Abnormal gait;Decreased balance;Decreased mobility;Decreased coordination;Decreased strength;Difficulty walking;Impaired sensation;Impaired tone   Rehab Potential Good   PT Frequency 2x / week   PT Duration 4 weeks   PT Treatment/Interventions ADLs/Self Care Home  Management;Therapeutic exercise;Therapeutic activities;Functional mobility training;Gait training;Balance training;Neuromuscular re-education;Electrical Stimulation   PT Next Visit Plan check LTG's and D/C.  Continue to educate on using cane with quad tip for exercise at home, but conitnuing to ambulate majority of time with RW.     Consulted and Agree with Plan of Care Patient;Family member/caregiver        Problem List Patient Active Problem List   Diagnosis Date Noted  . Ear pain 03/23/2015  . Anterior cerebral circulation hemorrhagic infarction ([place]) 11/24/2014  . Hemiparesis affecting right side as late effect of cerebrovascular accident ([place]) 09/05/2014  . Depression due to stroke ([place]) 09/05/2014  . Thrombocytopenia ([place]) 06/05/2014  . Chronic diastolic heart failure, NYHA class 1 ([place]) 06/05/2014  . Ex-smoker 06/03/2014  . Benign paroxysmal positional vertigo 04/28/2014  . Right carotid bruit   . Polyneuropathy, diabetic ([place]) 08/05/2012  . Seasonal allergic rhinitis   . Trigger thumb of left hand 10/12/2011  . Gout   . Adenomatous polyps   . CAD (coronary artery disease)   . Essential hypertension   . Hyperlipidemia   . Type 2 diabetes, uncontrolled, with neuropathy ([place])   . Ejection fraction    Cameron Sprang, PT, MPT [facility] [address] [place], [place], [address] Phone: [phone]   Fax:  [phone] 05/07/2015, 12:06 PM  Name: TORREN MAFFEO MRN: [id number] Date of Birth: [birth year]

## 2015-05-12 ENCOUNTER — Ambulatory Visit: Payer: PPO | Admitting: Occupational Therapy

## 2015-05-12 ENCOUNTER — Ambulatory Visit: Payer: PPO | Admitting: Physical Therapy

## 2015-05-12 ENCOUNTER — Encounter: Payer: Self-pay | Admitting: Occupational Therapy

## 2015-05-12 DIAGNOSIS — R279 Unspecified lack of coordination: Secondary | ICD-10-CM

## 2015-05-12 DIAGNOSIS — R270 Ataxia, unspecified: Secondary | ICD-10-CM

## 2015-05-12 DIAGNOSIS — R269 Unspecified abnormalities of gait and mobility: Secondary | ICD-10-CM | POA: Diagnosis not present

## 2015-05-12 DIAGNOSIS — G8191 Hemiplegia, unspecified affecting right dominant side: Secondary | ICD-10-CM

## 2015-05-12 DIAGNOSIS — M25611 Stiffness of right shoulder, not elsewhere classified: Secondary | ICD-10-CM

## 2015-05-12 DIAGNOSIS — R278 Other lack of coordination: Secondary | ICD-10-CM

## 2015-05-12 NOTE — Therapy (Signed)
[facility] [address] [place], [place], [address] Phone: [phone]   Fax:  [phone]  Occupational Therapy Treatment  Patient Details  Name: Jeffrey [name] MRN: [id number] Date of Birth: 09-06-1940 Referring Provider: Dr. Leonie Man  Encounter Date: 05/12/2015      OT End of Session - 05/12/15 1122    Visit Number 28   Number of Visits 35  27+[id number]=35   Date for OT Re-Evaluation 05/09/15   Authorization Type Blue Medicare HMO, no visit limit/no auth, G-code   Authorization Time Period renewal completed 0/0/76, cert period 04/08/61-05/07/52, next wk will be 1/4   Authorization - Visit Number 2   Authorization - Number of Visits 10   OT Start Time 1106   OT Stop Time 1145   OT Time Calculation (min) 39 min   Activity Tolerance Patient tolerated treatment well   Behavior During Therapy [facility] for tasks assessed/performed      Past Medical History  Diagnosis Date  . CAD (coronary artery disease)     PCI distal RCA ...2004, residual 70% LAD   /   ...nuclear...03/2007...no ischemia.Marland KitchenMarland Kitchenpreserved LV /  nuclear...03/03/2009...inferior scar..no ischemia..EF 51%  . Dyslipidemia     takes Atorvastatin daily  . Internal hemorrhoids   . Gout     takes Allopurinol daily and Colchicine as needed  . Seasonal allergic rhinitis   . Right carotid bruit   . Cyst of nasopharynx     per ENT Wilburn Cornelia  . Myocardial infarction [facility]) 45yr ago  . Peripheral edema     takes Lasix daily  . HTN (hypertension)     takes Cardura,Metoprolol,[name],and Amlodipine daily  . Arthritis   . GERD (gastroesophageal reflux disease)     takes Omeprazole daily  . History of colon polyps   . Type 2 diabetes, uncontrolled, with neuropathy ([place] 1989    takes Invokana daily and has an insulin pump ([name]  . 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 HCAP (healthcare-associated pneumonia)   .  Hemiparesis affecting right side as late effect of cerebrovascular accident ([place] 09/05/2014  . Anterior cerebral circulation hemorrhagic infarction ([facility] 11/24/2014    March, 2016, dominant left thalamic and left internal capsule ischemic infarct with resultant hemorrhagic transformation, secondary to small vessel disease. Resulted in right hemiparesis dysarthria and diplopia   //   readmission with aphasia July, 2016, this improved     Past Surgical History  Procedure Laterality Date  . Cataract extraction Bilateral 2010  . Elbow surgery Right 1997  . Balloon angioplasty, artery  1992, 2004    CAD, Dr. KRon Parker . Colonoscopy  04/03/2002    adenomatous polyp, int hemorrhoids  . Colonoscopy  07/18/2007    normal (Dr. SFuller Plan  . Rotator cuff repair  09/2011    left, with subacromial decompression  . Nasal septum surgery    . Colonoscopy  09/26/2012    tubular adenoma, sm int hem, rpt 5 yrs (Fuller Plan  . Tonsillectomy and adenoidectomy    . Eye lids raised    . Cardiac catheterization  2004  . Polypectomy N/A 05/27/2013    Procedure: ENDOSCOPIC NASOPHARYNGEAL MASS;  Surgeon: DJerrell Belfast MD    There were no vitals filed for this visit.  Visit Diagnosis:  Right hemiparesis ([place]  Ataxia  Decreased coordination  Stiffness of right shoulder joint      Subjective Assessment - 05/12/15 1647    Subjective  Pt  reports that he has been trying to work on picking up things at home.   Patient is accompained by: Family member   Pertinent History see epic   Patient Stated Goals utilization of right arm "I wish I had more control of it", feel comfortable being around people   Currently in Pain? No/denies           OT Treatments/Exercises (OP) - 04/23/15 0001    Neurological Re-education Exercises   Shoulder Flexion   In sitting, mid-high range shoulder flex with PVC frame with min facilitation/cues.   Other Grasp and Release Exercises  In sitting, low  range functional reaching to grasp/release cylinder objects with forearm on tabletop with RUE with min cues for compensation and min-mod difficulty which improved with repetition.   Low-range functional reaching with BUEs with min cueing for compensation.       Other Weight-Bearing Exercises 1 Wt. Bearing through both elbows in prone with chest/head lift for incr scapular depression.   Weight bearing through R hand with body on arm movements.      Scapular exercises in prone with shoulders in extension (retraction and depression) with min facilitation.   Arm bike x25mn with R hand wrapped for reciprocal movement with min cueing for compensation.                                               OT Short Term Goals - 04/30/15 1310    OT SHORT TERM GOAL #3   Title Pt will demo at least 55* R shoulder flex with min compensation in prep for functional reach.   Baseline 45*   Time 4   Period Weeks   Status Achieved  03/10/15:  50* with min-mod compensation/cueing; 04/13/15  50-55* with min-mod compensation inconsistent; 04/28/15  55-60* with min compensation   OT SHORT TERM GOAL #5   Title Pt will be able to grasp/release cylinder object independently using good control in 7/10 attempts without drops.--check 04/11/15   Time 4   Period Weeks   Status Achieved  04/13/15:  not consistent; 04/30/15 met along tabletop at approx this level.           OT Long Term Goals - 05/07/15 1053    OT LONG TERM GOAL #1   Title Pt will be independent with final HEP.--check LTGs 05/09/15   Time 8   Period Weeks   Status Revised  03/12/15:  not fully met/ongoing--continue for renewal period; 05/07/15 not performing consistently/updates ongoing   OT LONG TERM GOAL #3   Title Pt will demo at least 60* R shoulder flex with min compensation in prep for functional reach.   Baseline 45*   Time 8   Period Weeks   Status Revised  03/10/15:  50* with  min-mod compensation/cueing; revised 03/12/15;  not fully met 55-60*   OT LONG TERM GOAL #4   Title Pt will improve RUE functional reaching/coordination for ADLs as shown by improving score on box and blocks test to at least 15 blocks.--continue for renewal, check 05/09/15   Baseline 9 blocks   Time 8   Period Weeks   Status On-going  03/12/15:  10 blocks;  05/07/15:  10 blocks   OT LONG TERM GOAL #7   Title Pt will be able to grasp/release cylinder object independently using good control in 9/10 attempts without drops with  forearm supported on table.--check 04/11/15   Baseline 8   Period Weeks   Status On-going  05/07/15:  8/10   OT LONG TERM GOAL #8   Title Pt will be able to write name with RUE with at least 75% legiblity using AE prn.   Status Deferred  not met/ unable               Plan - 05/12/15 1127    Clinical Impression Statement Pt demo improved abilty to grasp/release objects with support/along tabletop.  Pt also demo improved ability to use RUE to help with transfers/weightbear.  Ataxia continues to limit open chain movement.   Plan neuro re-ed, functional reaching/use   OT Home Exercise Plan Education provided:  initial HEP issued; 03/03/15 memory compensations, cognitive/visual HEP, coordination HEP issued; wt. bearing on elbow with push to sitting   Consulted and Agree with Plan of Care Patient   Family Member Consulted wife        Problem List Patient Active Problem List   Diagnosis Date Noted  . Ear pain 03/23/2015  . Anterior cerebral circulation hemorrhagic infarction ([place]) 11/24/2014  . Hemiparesis affecting right side as late effect of cerebrovascular accident ([place]) 09/05/2014  . Depression due to stroke ([place]) 09/05/2014  . Thrombocytopenia ([place]) 06/05/2014  . Chronic diastolic heart failure, NYHA class 1 ([place]) 06/05/2014  . Ex-smoker 06/03/2014  . Benign paroxysmal positional vertigo 04/28/2014  . Right carotid bruit   . Polyneuropathy, diabetic ([place])  08/05/2012  . Seasonal allergic rhinitis   . Trigger thumb of left hand 10/12/2011  . Gout   . Adenomatous polyps   . CAD (coronary artery disease)   . Essential hypertension   . Hyperlipidemia   . Type 2 diabetes, uncontrolled, with neuropathy ([place])   . Ejection fraction     [facility] 05/12/2015, 4:51 PM  [facility] [address] [place], [place], [address] Phone: [phone]   Fax:  [phone]  Name: Jeffrey [name] MRN: [id number] Date of Birth: [birth year]  Vianne Bulls, OTR/L [facility] [address] [place], [place]  [address] [phone] phone [phone] 05/12/2015 4:51 PM

## 2015-05-12 NOTE — Therapy (Signed)
[facility] [address] [place], [place], [address] Phone: [phone]   Fax:  [phone]  Physical Therapy Treatment and Discharge Summary  Patient Details  Name: Jeffrey [name] MRN: [id number] Date of Birth: March 04, 1941 Referring Provider: Leonie Man  Encounter Date: 05/12/2015      PT End of Session - 05/12/15 1312    Visit Number 30   Number of Visits 33   Date for PT Re-Evaluation 05/13/15   Authorization Type Blue Medicare HMO-G- code every 10th visit   PT Start Time 1019   PT Stop Time 1100   PT Time Calculation (min) 41 min   Equipment Utilized During Treatment Gait belt   Activity Tolerance Patient tolerated treatment well   Behavior During Therapy [facility] for tasks assessed/performed      Past Medical History  Diagnosis Date  . CAD (coronary artery disease)     PCI distal RCA ...2004, residual 70% LAD   /   ...nuclear...03/2007...no ischemia.Marland KitchenMarland Kitchenpreserved LV /  nuclear...03/03/2009...inferior scar..no ischemia..EF 51%  . Dyslipidemia     takes Atorvastatin daily  . Internal hemorrhoids   . Gout     takes Allopurinol daily and Colchicine as needed  . Seasonal allergic rhinitis   . Right carotid bruit   . Cyst of nasopharynx     per ENT Wilburn Cornelia  . Myocardial infarction [facility]) 63yr ago  . Peripheral edema     takes Lasix daily  . HTN (hypertension)     takes Cardura,Metoprolol,[name],and Amlodipine daily  . Arthritis   . GERD (gastroesophageal reflux disease)     takes Omeprazole daily  . History of colon polyps   . Type 2 diabetes, uncontrolled, with neuropathy ([place] 1989    takes Invokana daily and has an insulin pump ([name]  . 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 HCAP (healthcare-associated pneumonia)   . Hemiparesis affecting right side as late effect of [place]vascular accident ([place] 09/05[place]  . Anterior cerebral circulation hemorrhagic  infarction ([facility] 11/24/2014    March, 2016, dominant left thalamic and left internal capsule ischemic infarct with resultant hemorrhagic transformation, secondary to small vessel disease. Resulted in right hemiparesis dysarthria and diplopia   //   readmission with aphasia July, 2016, this improved     Past Surgical History  Procedure Laterality Date  . Cataract extraction Bilateral 2010  . Elbow surgery Right 1997  . Balloon angioplasty, artery  1992, 2004    CAD, Dr. KRon Parker . Colonoscopy  04/03/2002    adenomatous polyp, int hemorrhoids  . Colonoscopy  07/18/2007    normal (Dr. SFuller Plan  . Rotator cuff repair  09/2011    left, with subacromial decompression  . Nasal septum surgery    . Colonoscopy  09/26/2012    tubular adenoma, sm int hem, rpt 5 yrs (Fuller Plan  . Tonsillectomy and adenoidectomy    . Eye lids raised    . Cardiac catheterization  2004  . Polypectomy N/A 05/27/2013    Procedure: ENDOSCOPIC NASOPHARYNGEAL MASS;  Surgeon: DJerrell Belfast MD    There were no vitals filed for this visit.  Visit Diagnosis:  Right hemiparesis ([place]  Abnormality of gait  Ataxia      Subjective Assessment - 05/12/15 1024    Subjective Pt reports no falls, no significant changes. Verbalized instructions provided by PT at last session, including DC plan, recommendation to utilize RW for all mobility but to practice walking with cane (  with quad tip) at  home with daughter assisting.   Pertinent History Blood glucose may drop when pt has 2 therapies in a row. Pt's family packs bag of snacks to eat if pt feels hypoglycemic.   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   Currently in Pain? No/denies                         [facility] Adult PT Treatment/Exercise - 05/12/15 0001    Ambulation/Gait   Ambulation/Gait Yes    Ambulation/Gait Assistance 6: Modified independent (Device/Increase time)   Ambulation/Gait Assistance Details Gait x100' over level, indoor surfaces with no external demands (head tuns, obstacle negotiation).   Ambulation Distance (Feet) 100 Feet   Assistive device Rolling walker;Other (Comment)  R hand orthosis; R AFO   Gait Pattern Step-through pattern;Decreased stance time - right;Decreased step length - left;Decreased weight shift to right;Ataxic;Step-to pattern;Decreased arm swing - right   Ambulation Surface Level;Indoor   Standardized Balance Assessment   Standardized Balance Assessment Berg Balance Test   Berg Balance Test   Sit to Stand Able to stand without using hands and stabilize independently   Standing Unsupported Able to stand safely 2 minutes   Sitting with Back Unsupported but Feet Supported on Floor or Stool Able to sit safely and securely 2 minutes   Stand to Sit Sits safely with minimal use of hands   Transfers Able to transfer safely, minor use of hands   Standing Unsupported with Eyes Closed Able to stand 10 seconds safely   Standing Ubsupported with Feet Together Able to place feet together independently and stand 1 minute safely   From Standing, Reach Forward with Outstretched Arm Can reach confidently >25 cm (10")   From Standing Position, Pick up Object from Floor Able to pick up shoe, needs supervision   From Standing Position, Turn to Look Behind Over each Shoulder Looks behind one side only/other side shows less weight shift   Turn 360 Degrees Needs close supervision or verbal cueing   Standing Unsupported, Alternately Place Feet on Step/Stool Able to complete >2 steps/needs minimal assist   Standing Unsupported, One Foot in Front Able to plae foot ahead of the other independently and hold 30 seconds   Standing on One Leg Tries to lift leg/unable to hold 3 seconds but remains standing independently   Total Score 44   Self-Care   Self-Care Other Self-Care  Comments   Other Self-Care Comments  Provided education on importance of continued physical activity after DC from PT, whether pt decides to walk with RW in church gym (provided information on free church gym and walking track close to pt residence) or joining [employer] at [employer]. Written information provided on specific locations and which had most appropriate equipment (e.g. NuStep for pt safety).                PT Education - 05/12/15 1103    Education provided Yes   Education Details Goals, progress, and DC plan. Discussed use of RW for all mobility, practicing gait with SPC when daughter, Marita [place], providing hands-on assist.  Educated pt on available community resources, with emphasis on [employer] via [employer].   Person(s) Educated Patient   Methods Explanation;Handout   Comprehension Verbalized understanding  PT Short Term Goals - 02/03/15 1324    PT SHORT TERM GOAL #1   Title Pt will perform HEP with family assistance/supervision, for lower extremitiy strengthening.  TARGET 02/06/15   Time 4   Period Weeks   Status Achieved   PT SHORT TERM GOAL #2   Title Pt will perform sit<>stand transfers with supervision, for improved efficiency and safety with transfers.   Baseline Met 11/22.   Time 4   Period Weeks   Status Achieved   PT SHORT TERM GOAL #3   Title Pt will improve TUG score to less than or equal to 70 seconds for decreased fall risk.   Baseline 52.62 seconds with R AFO and heel wedge;73 seconds without AFO   Time 4   Period Weeks   Status Achieved   PT SHORT TERM GOAL #4   Title Pt will ambulate at least 150 ft using least restrictive assistive device with minimal assistance for improved gait efficiency.   Baseline Met 11/22.   Time 4   Period Weeks   Status Achieved   PT SHORT TERM GOAL #5   Title Pt will verbalize understanding of CVA education.   Time 4   Period Weeks   Status Achieved           PT Long Term Goals - 05/12/15  1044    PT LONG TERM GOAL #1   Title Pt will verbalize understanding of fall prevention within home environment.  Modified TARGET 04/07/15   Baseline met 04/13/15   Time 4   Period Weeks   Status Achieved   PT LONG TERM GOAL #2   Title Pt will improve Timed Up and GO score to < 25 seconds for decreased fall risk.   Baseline 28.88 on 04/13/15  Feel that gait speed no longer safe goal due to incoordination   Time 4   Period Weeks   Status Deferred   PT LONG TERM GOAL #3   Title Pt will improve gait velocity to 1.94 ft/sec for improved gait efficiency and safety.   Baseline 1.33 ft/sec on 04/13/15  feel that gait speed goal no longer safe due to incoordination   Time 4   Period Weeks   Status Deferred   PT LONG TERM GOAL #4   Title Pt will improve BERG balance score to 43/56 in order to indicate decreased fall risk. (Modified Target date: 05/11/15)   Baseline 3/7: Berg score = 44/56  updated due to goal met on 04/13/15   Time 4   Period Weeks   Status Achieved   PT LONG TERM GOAL #5   Title Pt will ambulate >300' using least restrictive assistive device at mod I level for improved efficiency and safety with gait.   Baseline met 04/13/15 (indoor surfaces)    Time 4   Status Achieved   PT LONG TERM GOAL #6   Title Pt will verbalize plans for continued community fitness upon D/C from PT. (Modified Target Date: 05/11/15)   Baseline Pt reports plan to continue to walk but does not belong to a gym.  PT provided extensive education on community fitness available close to patient's residence.   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  (Target Date: 05/11/15)   Baseline Met 3/7.  Status Achieved   PT LONG TERM GOAL #8   Title Pt will demonstrate compensatory strategy for standing balance in order to relieve RUE for  functional task in standing. (Target Date: 05/11/15)   Baseline Met 05/18/22.   Status Achieved               Plan - 2015-05-18 1726    Clinical Impression Statement Pt has met all goals and therefore will be discharged from outpatient PT at this time. Pt educated on PT goals, progress, and DC plan. Pt verbalized understnading and was in full agreement with DC plan.   Consulted and Agree with Plan of Care Patient          G-Codes - May 18, 2015 1026    Functional Assessment Tool Used sit<>stand with Mod I, gait with RW and R hand orthosis with mod I over level, indoor surfaces; TUG 28.84  seconds   Functional Limitation Mobility: Walking and moving around   Mobility: Walking and Moving Around Goal Status [phone]) At least 20 percent but less than 40 percent impaired, limited or restricted   Mobility: Walking and Moving Around Discharge Status [phone]) At least 20 percent but less than 40 percent impaired, limited or restricted      Problem List Patient Active Problem List   Diagnosis Date Noted  . Ear pain 03/23/2015  . Anterior cerebral circulation hemorrhagic infarction ([place]) 11/24/2014  . Hemiparesis affecting right side as late effect of cerebrovascular accident ([place]) 09/05/2014  . Depression due to stroke ([place]) 09/05/2014  . Thrombocytopenia ([place]) 06/05/2014  . Chronic diastolic heart failure, NYHA class 1 ([place]) 06/05/2014  . Ex-smoker 06/03/2014  . Benign paroxysmal positional vertigo 04/28/2014  . Right carotid bruit   . Polyneuropathy, diabetic ([place]) 08/05/2012  . Seasonal allergic rhinitis   . Trigger thumb of left hand 10/12/2011  . Gout   . Adenomatous polyps   . CAD (coronary artery disease)   . Essential hypertension   . Hyperlipidemia   . Type 2 diabetes, uncontrolled, with neuropathy ([place])   . Ejection fraction     PHYSICAL THERAPY DISCHARGE SUMMARY  Visits from Start of Care: 30  Current functional level related to goals / functional outcomes: See above  goals and goal statuses for details.   Remaining deficits: Pt continues to demonstrate gait impairments due to sensory impairments, ataxia in RUE/RLE. However, pt is currently Mod I for household and short-distance community mobility with RW, R hand orthosis, and R AFO.   Education / Equipment: HEP, R AFO (Reaction), R hand orthosis Plan: Patient agrees to discharge.  Patient goals were met. Patient is being discharged due to meeting the stated rehab goals.  ?????        Name: Jeffrey [name] MRN: [id number] Date of Birth: 1940/10/17

## 2015-05-15 ENCOUNTER — Ambulatory Visit: Payer: PPO | Admitting: Physical Therapy

## 2015-05-16 ENCOUNTER — Encounter: Payer: Self-pay | Admitting: Family Medicine

## 2015-05-19 ENCOUNTER — Ambulatory Visit: Payer: PPO | Admitting: Occupational Therapy

## 2015-05-19 ENCOUNTER — Ambulatory Visit: Payer: PPO | Admitting: Physical Therapy

## 2015-05-19 DIAGNOSIS — R278 Other lack of coordination: Secondary | ICD-10-CM

## 2015-05-19 DIAGNOSIS — R270 Ataxia, unspecified: Secondary | ICD-10-CM

## 2015-05-19 DIAGNOSIS — R279 Unspecified lack of coordination: Secondary | ICD-10-CM

## 2015-05-19 DIAGNOSIS — M25611 Stiffness of right shoulder, not elsewhere classified: Secondary | ICD-10-CM

## 2015-05-19 DIAGNOSIS — R269 Unspecified abnormalities of gait and mobility: Secondary | ICD-10-CM | POA: Diagnosis not present

## 2015-05-19 DIAGNOSIS — G8191 Hemiplegia, unspecified affecting right dominant side: Secondary | ICD-10-CM

## 2015-05-19 NOTE — Therapy (Signed)
[facility] [address] [place], [place], [address] Phone: [phone]   Fax:  [phone]  Occupational Therapy Treatment  Patient Details  Name: Jeffrey [name] MRN: [id number] Date of Birth: September 02, 1940 Referring Provider: Dr. Leonie Man  Encounter Date: 05/19/2015      OT End of Session - 05/19/15 1051    Visit Number 29   Number of Visits 35  27+8=35   Date for OT Re-Evaluation 05/09/15   Authorization Type Blue Medicare HMO, no visit limit/no auth, G-code   Authorization Time Period renewal completed 03/15/60, cert period 04/09/95-11/13/90, next wk will be 1/4   Authorization - Visit Number 3   Authorization - Number of Visits 10   OT Start Time 1019   OT Stop Time 1100   OT Time Calculation (min) 41 min   Activity Tolerance Patient tolerated treatment well   Behavior During Therapy [facility] for tasks assessed/performed      Past Medical History  Diagnosis Date  . CAD (coronary artery disease)     PCI distal RCA ...2004, residual 70% LAD   /   ...nuclear...03/2007...no ischemia.Marland KitchenMarland Kitchenpreserved LV /  nuclear...03/03/2009...inferior scar..no ischemia..EF 51%  . Dyslipidemia     takes Atorvastatin daily  . Internal hemorrhoids   . Gout     takes Allopurinol daily and Colchicine as needed  . Seasonal allergic rhinitis   . Right carotid bruit   . Cyst of nasopharynx     per ENT Wilburn Cornelia  . Myocardial infarction [facility]) 51yr ago  . Peripheral edema     takes Lasix daily  . HTN (hypertension)     takes Cardura,Metoprolol,[name],and Amlodipine daily  . Arthritis   . GERD (gastroesophageal reflux disease)     takes Omeprazole daily  . History of colon polyps   . Type 2 diabetes, uncontrolled, with neuropathy ([place] 1989    takes Invokana daily and has an insulin pump ([name]  . 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 HCAP (healthcare-associated pneumonia)   .  Hemiparesis affecting right side as late effect of cerebrovascular accident ([place] 09/05/2014  . Anterior cerebral circulation hemorrhagic infarction ([facility] 11/24/2014    March, 2016, dominant left thalamic and left internal capsule ischemic infarct with resultant hemorrhagic transformation, secondary to small vessel disease. Resulted in right hemiparesis dysarthria and diplopia   //   readmission with aphasia July, 2016, this improved     Past Surgical History  Procedure Laterality Date  . Cataract extraction Bilateral 2010  . Elbow surgery Right 1997  . Balloon angioplasty, artery  1992, 2004    CAD, Dr. KRon Parker . Colonoscopy  04/03/2002    adenomatous polyp, int hemorrhoids  . Colonoscopy  07/18/2007    normal (Dr. SFuller Plan  . Rotator cuff repair  09/2011    left, with subacromial decompression  . Nasal septum surgery    . Colonoscopy  09/26/2012    tubular adenoma, sm int hem, rpt 5 yrs (Fuller Plan  . Tonsillectomy and adenoidectomy    . Eye lids raised    . Cardiac catheterization  2004  . Polypectomy N/A 05/27/2013    Procedure: ENDOSCOPIC NASOPHARYNGEAL MASS;  Surgeon: DJerrell Belfast MD    There were no vitals filed for this visit.  Visit Diagnosis:  Right hemiparesis ([place]  Ataxia  Decreased coordination  Stiffness of right shoulder joint      Subjective Assessment - 05/19/15 1049    Subjective  Pt  reports nothing new   Patient is accompained by: Family member   Pertinent History see epic   Patient Stated Goals utilization of right arm "I wish I had more control of it", feel comfortable being around people   Currently in Pain? No/denies              OT Treatments/Exercises (OP) - 04/23/15 0001    Neurological Re-education Exercises   Shoulder Flexion  In sitting, mid-high range shoulder flex with PVC frame with min facilitation/cues.  In standing, UE ranger for mid-high range shoulder flexion with min facilitation for control and  compensation.   Other Grasp and Release Exercises  In sitting, low range functional reaching to grasp/release cylinder objects with along tabletop with RUE with min cues for compensation and min difficulty which improved with repetition.          Other Weight-Bearing Exercises 1 Wt. Bearing through both elbows in prone with chest/head lift for incr scapular depression.   Weight bearing through R hand with body on arm movements with min cues.      Scapular exercises in prone with shoulders in extension (retraction and depression) with min facilitation.                                                    OT Short Term Goals - 04/30/15 1310    OT SHORT TERM GOAL #3   Title Pt will demo at least 55* R shoulder flex with min compensation in prep for functional reach.   Baseline 45*   Time 4   Period Weeks   Status Achieved  03/10/15:  50* with min-mod compensation/cueing; 04/13/15  50-55* with min-mod compensation inconsistent; 04/28/15  55-60* with min compensation   OT SHORT TERM GOAL #5   Title Pt will be able to grasp/release cylinder object independently using good control in 7/10 attempts without drops.--check 04/11/15   Time 4   Period Weeks   Status Achieved  04/13/15:  not consistent; 04/30/15 met along tabletop at approx this level.           OT Long Term Goals - 05/07/15 1053    OT LONG TERM GOAL #1   Title Pt will be independent with final HEP.--check LTGs 05/09/15   Time 8   Period Weeks   Status Revised  03/12/15:  not fully met/ongoing--continue for renewal period; 05/07/15 not performing consistently/updates ongoing   OT LONG TERM GOAL #3   Title Pt will demo at least 60* R shoulder flex with min compensation in prep for functional reach.   Baseline 45*   Time 8   Period Weeks   Status Revised  03/10/15:  50* with min-mod compensation/cueing; revised 03/12/15;  not fully met 55-60*   OT LONG  TERM GOAL #4   Title Pt will improve RUE functional reaching/coordination for ADLs as shown by improving score on box and blocks test to at least 15 blocks.--continue for renewal, check 05/09/15   Baseline 9 blocks   Time 8   Period Weeks   Status On-going  03/12/15:  10 blocks;  05/07/15:  10 blocks   OT LONG TERM GOAL #7   Title Pt will be able to grasp/release cylinder object independently using good control in 9/10 attempts without drops with forearm supported on table.--check 04/11/15   Baseline 8  Period Weeks   Status On-going  05/07/15:  8/10   OT LONG TERM GOAL #8   Title Pt will be able to write name with RUE with at least 75% legiblity using AE prn.   Status Deferred  not met/ unable               Problem List Patient Active Problem List   Diagnosis Date Noted  . Ear pain 03/23/2015  . Anterior cerebral circulation hemorrhagic infarction ([place]) 11/24/2014  . Hemiparesis affecting right side as late effect of cerebrovascular accident ([place]) 09/05/2014  . Depression due to stroke ([place]) 09/05/2014  . Thrombocytopenia ([place]) 06/05/2014  . Chronic diastolic heart failure, NYHA class 1 ([place]) 06/05/2014  . Ex-smoker 06/03/2014  . Benign paroxysmal positional vertigo 04/28/2014  . Right carotid bruit   . Polyneuropathy, diabetic ([place]) 08/05/2012  . Seasonal allergic rhinitis   . Trigger thumb of left hand 10/12/2011  . Gout   . Adenomatous polyps   . CAD (coronary artery disease)   . Essential hypertension   . Hyperlipidemia   . Type 2 diabetes, uncontrolled, with neuropathy ([place])   . Ejection fraction     [facility] 05/19/2015, 10:52 AM  [facility] [address] [place], [place], [address] Phone: [phone]   Fax:  [phone]  Name: Jeffrey [name] MRN: [id number] Date of Birth: 01-15-1941  Vianne Bulls, OTR/L [facility] [address] [place],    [phone] phone [phone] 05/19/2015 1:48 PM

## 2015-05-21 ENCOUNTER — Ambulatory Visit: Payer: PPO | Admitting: Physical Therapy

## 2015-05-21 ENCOUNTER — Ambulatory Visit: Payer: PPO | Admitting: Occupational Therapy

## 2015-05-21 DIAGNOSIS — R269 Unspecified abnormalities of gait and mobility: Secondary | ICD-10-CM | POA: Diagnosis not present

## 2015-05-21 DIAGNOSIS — R278 Other lack of coordination: Secondary | ICD-10-CM

## 2015-05-21 DIAGNOSIS — R270 Ataxia, unspecified: Secondary | ICD-10-CM

## 2015-05-21 DIAGNOSIS — R279 Unspecified lack of coordination: Secondary | ICD-10-CM

## 2015-05-21 DIAGNOSIS — G8191 Hemiplegia, unspecified affecting right dominant side: Secondary | ICD-10-CM

## 2015-05-21 DIAGNOSIS — M25611 Stiffness of right shoulder, not elsewhere classified: Secondary | ICD-10-CM

## 2015-05-21 NOTE — Therapy (Signed)
[facility] [address] [place], [place], [address] Phone: [phone]   Fax:  [phone]  Occupational Therapy Treatment  Patient Details  Name: Jeffrey [name] MRN: [id number] Date of Birth: May 05, 1940 Referring Provider: Dr. Leonie Man  Encounter Date: 05/21/2015      OT End of Session - 05/21/15 1315    Visit Number 30   Number of Visits 35  27+8=35   Date for OT Re-Evaluation 05/09/15   Authorization Type [employer] HMO, no visit limit/no auth, G-code   Authorization Time Period renewal completed 0/2/40, cert period 11/11/33-05/06/97, wk 2/4   Authorization - Visit Number 4   Authorization - Number of Visits 10   OT Start Time 1018   OT Stop Time 1100   OT Time Calculation (min) 42 min   Activity Tolerance Patient tolerated treatment well   Behavior During Therapy [facility] for tasks assessed/performed      Past Medical History  Diagnosis Date  . CAD (coronary artery disease)     PCI distal RCA ...2004, residual 70% LAD   /   ...nuclear...03/2007...no ischemia.Marland KitchenMarland Kitchenpreserved LV /  nuclear...03/03/2009...inferior scar..no ischemia..EF 51%  . Dyslipidemia     takes Atorvastatin daily  . Internal hemorrhoids   . Gout     takes Allopurinol daily and Colchicine as needed  . Seasonal allergic rhinitis   . Right carotid bruit   . Cyst of nasopharynx     per ENT Wilburn Cornelia  . Myocardial infarction [facility] [place]) 59yr ago  . Peripheral edema     takes Lasix daily  . HTN (hypertension)     takes Cardura,Metoprolol,[name],and Amlodipine daily  . Arthritis   . GERD (gastroesophageal reflux disease)     takes Omeprazole daily  . History of colon polyps   . Type 2 diabetes, uncontrolled, with neuropathy ([place] 1989    takes Invokana daily and has an insulin pump ([name]  . 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 HCAP (healthcare-associated pneumonia)   . Hemiparesis affecting  right side as late effect of [place]vascular [place] 09/[place]2014  . Anterior cerebral circulation hemorrhagic infarction ([facility] 11/24/2014    March, 2016, dominant left thalamic and left internal capsule ischemic infarct with resultant hemorrhagic transformation, secondary to small vessel disease. Resulted in right hemiparesis dysarthria and diplopia   //   readmission with aphasia July, 2016, this improved     Past Surgical History  Procedure Laterality Date  . Cataract extraction Bilateral 2010  . Elbow surgery Right 1997  . Balloon angioplasty, artery  1992, 2004    CAD, Dr. KRon Parker . Colonoscopy  04/03/2002    adenomatous polyp, int hemorrhoids  . Colonoscopy  07/18/2007    normal (Dr. SFuller Plan  . Rotator cuff repair  09/2011    left, with subacromial decompression  . Nasal septum surgery    . Colonoscopy  09/26/2012    tubular adenoma, sm int hem, rpt 5 yrs (Fuller Plan  . Tonsillectomy and adenoidectomy    . Eye lids raised    . Cardiac catheterization  2004  . Polypectomy N/A 05/27/2013    Procedure: ENDOSCOPIC NASOPHARYNGEAL MASS;  Surgeon: DJerrell Belfast MD    There were no vitals filed for this visit.  Visit Diagnosis:  Right hemiparesis ([place]  Ataxia  Decreased coordination  Stiffness of right shoulder joint      Subjective Assessment - 05/21/15 1314    Subjective  Pt reports performing snack/sandwich  prep at home from w/c level using RUE to help spread mayo/etc.   Pertinent History see epic   Patient Stated Goals utilization of right arm "I wish I had more control of it", feel comfortable being around people   Currently in Pain? No/denies      OT Treatments/Exercises (OP) - 04/23/15 0001    Neurological Re-education Exercises   Shoulder Flexion  In sitting, mid-high range shoulder flex with PVC frame with min facilitation/cues.  In sitting, mid-high range  Shoulder flex with cane (vertically) with min cueing for shoulder hike.     Other Grasp and Release Exercises  In sitting, low range functional reaching to grasp/release cylinder objects with RUE with min cues for compensation and min difficulty which improved with repetition.          Other Weight-Bearing Exercises 1 Wt. Bearing through both elbows in prone with chest/head lift for incr scapular depression.   Weight bearing through R hand on tilted stool with min facilitation/cues.      Scapular exercises in prone with shoulders in extension (retraction) with min cues.                                                                OT Short Term Goals - 04/30/15 1310    OT SHORT TERM GOAL #3   Title Pt will demo at least 55* R shoulder flex with min compensation in prep for functional reach.   Baseline 45*   Time 4   Period Weeks   Status Achieved  03/10/15:  50* with min-mod compensation/cueing; 04/13/15  50-55* with min-mod compensation inconsistent; 04/28/15  55-60* with min compensation   OT SHORT TERM GOAL #5   Title Pt will be able to grasp/release cylinder object independently using good control in 7/10 attempts without drops.--check 04/11/15   Time 4   Period Weeks   Status Achieved  04/13/15:  not consistent; 04/30/15 met along tabletop at approx this level.           OT Long Term Goals - 05/07/15 1053    OT LONG TERM GOAL #1   Title Pt will be independent with final HEP.--check LTGs 05/09/15   Time 8   Period Weeks   Status Revised  03/12/15:  not fully met/ongoing--continue for renewal period; 05/07/15 not performing consistently/updates ongoing   OT LONG TERM GOAL #3   Title Pt will demo at least 60* R shoulder flex with min compensation in prep for functional reach.   Baseline 45*   Time 8   Period Weeks   Status Revised  03/10/15:  50* with min-mod compensation/cueing; revised 03/12/15;  not fully met 55-60*   OT LONG TERM GOAL #4   Title  Pt will improve RUE functional reaching/coordination for ADLs as shown by improving score on box and blocks test to at least 15 blocks.--continue for renewal, check 05/09/15   Baseline 9 blocks   Time 8   Period Weeks   Status On-going  03/12/15:  10 blocks;  05/07/15:  10 blocks   OT LONG TERM GOAL #7   Title Pt will be able to grasp/release cylinder object independently using good control in 9/10 attempts without drops with forearm supported on table.--check 04/11/15   Baseline 8   Period  Weeks   Status On-going  05/07/15:  8/10   OT LONG TERM GOAL #8   Title Pt will be able to write name with RUE with at least 75% legiblity using AE prn.   Status Deferred  not met/ unable               Plan - 05/21/15 1643    Clinical Impression Statement Pt slowly progressing with improved grasp/release of objects and incr RUE functional use with snack prep.     Clinical Impairments Affecting Rehab Potential severity of deficits, sensory deficits, ataxia   Plan neuro re-ed, functional use/reaching   OT Home Exercise Plan Education provided:  initial HEP issued; 03/03/15 memory compensations, cognitive/visual HEP, coordination HEP issued; wt. bearing on elbow with push to sitting   Consulted and Agree with Plan of Care Patient        Problem List Patient Active Problem List   Diagnosis Date Noted  . Ear pain 03/23/2015  . Anterior cerebral circulation hemorrhagic infarction ([place]) 11/24/2014  . Hemiparesis affecting right side as late effect of cerebrovascular accident ([place]) 09/05/2014  . Depression due to stroke ([place]) 09/05/2014  . Thrombocytopenia ([place]) 06/05/2014  . Chronic diastolic heart failure, NYHA class 1 ([place]) 06/05/2014  . Ex-smoker 06/03/2014  . Benign paroxysmal positional vertigo 04/28/2014  . Right carotid bruit   . Polyneuropathy, diabetic ([place]) 08/05/2012  . Seasonal allergic rhinitis   . Trigger thumb of left hand 10/12/2011  . Gout   . Adenomatous polyps   . CAD  (coronary artery disease)   . Essential hypertension   . Hyperlipidemia   . Type 2 diabetes, uncontrolled, with neuropathy ([place])   . Ejection fraction     [facility] 05/21/2015, 4:45 PM  [facility] [address] [place], [place], [address] Phone: [phone]   Fax:  [phone]  Name: Jeffrey [name] MRN: [id number] Date of Birth: Oct 08, 1940   Vianne Bulls, OTR/L [facility] [address] [place], [place]  [address] [phone] phone [phone] 05/21/2015 4:45 PM   '

## 2015-05-26 ENCOUNTER — Ambulatory Visit: Payer: PPO | Admitting: Physical Therapy

## 2015-05-26 ENCOUNTER — Ambulatory Visit: Payer: PPO | Admitting: Occupational Therapy

## 2015-05-26 DIAGNOSIS — R279 Unspecified lack of coordination: Secondary | ICD-10-CM

## 2015-05-26 DIAGNOSIS — R270 Ataxia, unspecified: Secondary | ICD-10-CM

## 2015-05-26 DIAGNOSIS — R269 Unspecified abnormalities of gait and mobility: Secondary | ICD-10-CM | POA: Diagnosis not present

## 2015-05-26 DIAGNOSIS — M25611 Stiffness of right shoulder, not elsewhere classified: Secondary | ICD-10-CM

## 2015-05-26 DIAGNOSIS — R278 Other lack of coordination: Secondary | ICD-10-CM

## 2015-05-26 DIAGNOSIS — G8191 Hemiplegia, unspecified affecting right dominant side: Secondary | ICD-10-CM

## 2015-05-26 NOTE — Therapy (Signed)
[facility] [address] [place], [place], [address] Phone: [phone]   Fax:  [phone]  Occupational Therapy Treatment  Patient Details  Name: Jeffrey [name] MRN: [id number] Date of Birth: 05/10/1940 Referring Provider: Dr. Leonie Man  Encounter Date: 05/26/2015      OT End of Session - 05/26/15 1301    Visit Number 31   Number of Visits 35  27+8=35   Date for OT Re-Evaluation 05/09/15   Authorization Type [employer] HMO, no visit limit/no auth, G-code   Authorization Time Period renewal completed 08/13/60, cert period 11/10/26-06/06/30, wk 2/4   Authorization - Visit Number 5   Authorization - Number of Visits 10   OT Start Time 1105   OT Stop Time 1150   OT Time Calculation (min) 45 min   Activity Tolerance Patient tolerated treatment well   Behavior During Therapy [facility] for tasks assessed/performed      Past Medical History  Diagnosis Date  . CAD (coronary artery disease)     PCI distal RCA ...2004, residual 70% LAD   /   ...nuclear...03/2007...no ischemia.Marland KitchenMarland Kitchenpreserved LV /  nuclear...03/03/2009...inferior scar..no ischemia..EF 51%  . Dyslipidemia     takes Atorvastatin daily  . Internal hemorrhoids   . Gout     takes Allopurinol daily and Colchicine as needed  . Seasonal allergic rhinitis   . Right carotid bruit   . Cyst of nasopharynx     per ENT Wilburn Cornelia  . Myocardial infarction [facility]) 17yr ago  . Peripheral edema     takes Lasix daily  . HTN (hypertension)     takes Cardura,Metoprolol,[name],and Amlodipine daily  . Arthritis   . GERD (gastroesophageal reflux disease)     takes Omeprazole daily  . History of colon polyps   . Type 2 diabetes, uncontrolled, with neuropathy ([place] 1989    takes Invokana daily and has an insulin pump ([name]  . 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 HCAP (healthcare-associated pneumonia)   . Hemiparesis affecting  right side as late effect of cerebrovascular accident ([place] 09/05/2014  . Anterior cerebral circulation hemorrhagic infarction ([facility] 11/24/2014    March, 2016, dominant left thalamic and left internal capsule ischemic infarct with resultant hemorrhagic transformation, secondary to small vessel disease. Resulted in right hemiparesis dysarthria and diplopia   //   readmission with aphasia July, 2016, this improved     Past Surgical History  Procedure Laterality Date  . Cataract extraction Bilateral 2010  . Elbow surgery Right 1997  . Balloon angioplasty, artery  1992, 2004    CAD, Dr. KRon Parker . Colonoscopy  04/03/2002    adenomatous polyp, int hemorrhoids  . Colonoscopy  07/18/2007    normal (Dr. SFuller Plan  . Rotator cuff repair  09/2011    left, with subacromial decompression  . Nasal septum surgery    . Colonoscopy  09/26/2012    tubular adenoma, sm int hem, rpt 5 yrs (Fuller Plan  . Tonsillectomy and adenoidectomy    . Eye lids raised    . Cardiac catheterization  2004  . Polypectomy N/A 05/27/2013    Procedure: ENDOSCOPIC NASOPHARYNGEAL MASS;  Surgeon: DJerrell Belfast MD    There were no vitals filed for this visit.  Visit Diagnosis:  Right hemiparesis ([place]  Ataxia  Decreased coordination  Stiffness of right shoulder joint      Subjective Assessment - 05/26/15 1300    Subjective  Pt reports that he  is washing dishes now and that it is getting easier.   Pertinent History see epic   Patient Stated Goals utilization of right arm "I wish I had more control of it", feel comfortable being around people   Currently in Pain? No/denies            OT Treatments/Exercises (OP) - 04/23/15 0001       Neurological Re-education Exercises      Shoulder Flexion   In sitting, mid-high range shoulder flex with PVC frame with min facilitation/cues followed by chest press with min cueing.        Other Grasp and Release Exercises   In  sitting, low range functional reaching to grasp/release cylinder objects on table with RUE with min cues for compensation and min difficulty which improved with repetition.                  Other Weight-Bearing Exercises 1  Wt. Bearing through both UEs in cat/cow positions in modified quadraped with hands on table with min facilitation/cues for incr scapular stability/mobility.  Weight bearing through R hand in sitting with body on arm movements with min cueing for incr scapula stabilization.                                                                             OT Short Term Goals - 04/30/15 1310    OT SHORT TERM GOAL #3   Title Pt will demo at least 55* R shoulder flex with min compensation in prep for functional reach.   Baseline 45*   Time 4   Period Weeks   Status Achieved  03/10/15:  50* with min-mod compensation/cueing; 04/13/15  50-55* with min-mod compensation inconsistent; 04/28/15  55-60* with min compensation   OT SHORT TERM GOAL #5   Title Pt will be able to grasp/release cylinder object independently using good control in 7/10 attempts without drops.--check 04/11/15   Time 4   Period Weeks   Status Achieved  04/13/15:  not consistent; 04/30/15 met along tabletop at approx this level.           OT Long Term Goals - 05/07/15 1053    OT LONG TERM GOAL #1   Title Pt will be independent with final HEP.--check LTGs 05/09/15   Time 8   Period Weeks   Status Revised  03/12/15:  not fully met/ongoing--continue for renewal period; 05/07/15 not performing consistently/updates ongoing   OT LONG TERM GOAL #3   Title Pt will demo at least 60* R shoulder flex with min compensation in prep for functional reach.   Baseline 45*   Time 8   Period Weeks   Status Revised  03/10/15:  50* with min-mod compensation/cueing; revised 03/12/15;  not fully met 55-60*   OT LONG TERM GOAL  #4   Title Pt will improve RUE functional reaching/coordination for ADLs as shown by improving score on box and blocks test to at least 15 blocks.--continue for renewal, check 05/09/15   Baseline 9 blocks   Time 8   Period Weeks   Status On-going  03/12/15:  10 blocks;  05/07/15:  10 blocks   OT LONG TERM GOAL #7   Title Pt will  be able to grasp/release cylinder object independently using good control in 9/10 attempts without drops with forearm supported on table.--check 04/11/15   Baseline 8   Period Weeks   Status On-going  05/07/15:  8/10   OT LONG TERM GOAL #8   Title Pt will be able to write name with RUE with at least 75% legiblity using AE prn.   Status Deferred  not met/ unable               Plan - 05/26/15 1301    Clinical Impression Statement Pt is progressing slowly with improved control with grasp/release.  Pt reports that he is standing to wash dishes now with BUEs.   Plan neuro re-ed, functional reaching/use   OT Home Exercise Plan Education provided:  initial HEP issued; 03/03/15 memory compensations, cognitive/visual HEP, coordination HEP issued; wt. bearing on elbow with push to sitting   Consulted and Agree with Plan of Care Patient        Problem List Patient Active Problem List   Diagnosis Date Noted  . Ear pain 03/23/2015  . Anterior cerebral circulation hemorrhagic infarction ([place]) 11/24/2014  . Hemiparesis affecting right side as late effect of cerebrovascular accident ([place]) 09/05/2014  . Depression due to stroke ([place]) 09/05/2014  . Thrombocytopenia ([place]) 06/05/2014  . Chronic diastolic heart failure, NYHA class 1 ([place]) 06/05/2014  . Ex-smoker 06/03/2014  . Benign paroxysmal positional vertigo 04/28/2014  . Right carotid bruit   . Polyneuropathy, diabetic ([place]) 08/05/2012  . Seasonal allergic rhinitis   . Trigger thumb of left hand 10/12/2011  . Gout   . Adenomatous polyps   . CAD (coronary artery disease)   . Essential hypertension   .  Hyperlipidemia   . Type 2 diabetes, uncontrolled, with neuropathy ([place])   . Ejection fraction     [facility] 05/26/2015, 1:03 PM  [facility] [address] [place], [place], [address] Phone: [phone]   Fax:  [phone]  Name: Jeffrey [name] MRN: [id number] Date of Birth: 06-Jan-1941  Vianne Bulls, OTR/L [facility] [address] [place], [place]  [address] [phone] phone [phone] 05/26/2015 1:03 PM

## 2015-05-28 ENCOUNTER — Ambulatory Visit: Payer: PPO | Admitting: Occupational Therapy

## 2015-05-28 ENCOUNTER — Ambulatory Visit: Payer: PPO | Admitting: Physical Therapy

## 2015-05-28 DIAGNOSIS — R270 Ataxia, unspecified: Secondary | ICD-10-CM

## 2015-05-28 DIAGNOSIS — G8191 Hemiplegia, unspecified affecting right dominant side: Secondary | ICD-10-CM

## 2015-05-28 DIAGNOSIS — R278 Other lack of coordination: Secondary | ICD-10-CM

## 2015-05-28 DIAGNOSIS — R269 Unspecified abnormalities of gait and mobility: Secondary | ICD-10-CM | POA: Diagnosis not present

## 2015-05-28 DIAGNOSIS — R279 Unspecified lack of coordination: Secondary | ICD-10-CM

## 2015-05-28 DIAGNOSIS — M25611 Stiffness of right shoulder, not elsewhere classified: Secondary | ICD-10-CM

## 2015-05-28 NOTE — Patient Instructions (Addendum)
   1.  Hold stick vertically with right hand on top and left hand below.  Use your left hand to help push the right up, keeping your elbow straight and right shoulder down. x15  2.  Hold stick horizontally at your bellybutton, squeezing shoulder blades back.  Then push elbows straight keeping your right shoulder down. x15  3.  Stand at table, put both hands flat on table, walk your feet back.  Then, arch your back (angry cat).  Hold 5sec and then let your back sag down and look up.  Hold 5 sec.  x10  4.   Weight Bearing Hand Sit    Place hands flat at sides. Lean body weight to right side (can reach with the left hand keeping elbow straight and shoulder in alignment). Hold 10 seconds. Repeat 15 times. Do 1 sessions per day.   5.  Increase the use of your affected arm by:  - When standing at a counter, place your affected arm/hand on the counter while you brush your teeth, etc.  - When bathing or dusting (sitting), incorporate your affected arm by moving it with the unaffected arm (hand-over-hand).  - When sitting to eat or perform other activities, place your arm up on a table/pillow/arm rest.  - Sit at table, and try to pick up bottles, cups, bottle caps, etc of different sizes/shapes and move across table or put smaller objects in bowl/container.  Go slow and work on control.  -Try to use both hands to pick up objects  -Flip playing cards by opening hand on card and turning palm up.   6.  Tabletop Ergometer/Arm bike (wrapping right hand with ace wrap)   7.  Sit and lean down on right elbow, then use R hand to push to sitting/straighten elbow.  x10-15

## 2015-05-28 NOTE — Therapy (Signed)
[facility] [address] [place], [place], [address] Phone: [phone]   Fax:  [phone]  Occupational Therapy Treatment  Patient Details  Name: Jeffrey [name] MRN: [id number] Date of Birth: 08-16-1940 Referring Provider: Dr. Leonie Man  Encounter Date: 05/28/2015      OT End of Session - 05/28/15 1203    Visit Number 32   Number of Visits 35  27+8=35   Date for OT Re-Evaluation 05/09/15   Authorization Type [employer], no visit limit/no auth, G-code   Authorization Time Period renewal completed 04/12/03, cert period 05/14/74-09/07/39, wk 3/4   Authorization - Visit Number 6   Authorization - Number of Visits 10   OT Start Time 1015   OT Stop Time 1100   OT Time Calculation (min) 45 min   Activity Tolerance Patient tolerated treatment well   Behavior During Therapy [facility] for tasks assessed/performed      Past Medical History  Diagnosis Date  . CAD (coronary artery disease)     PCI distal RCA ...2004, residual 70% LAD   /   ...nuclear...03/2007...no ischemia.Marland KitchenMarland Kitchenpreserved LV /  nuclear...03/03/2009...inferior scar..no ischemia..EF 51%  . Dyslipidemia     takes Atorvastatin daily  . Internal hemorrhoids   . Gout     takes Allopurinol daily and Colchicine as needed  . Seasonal allergic rhinitis   . Right carotid bruit   . Cyst of nasopharynx     per ENT Wilburn Cornelia  . Myocardial infarction [facility]) 26yr ago  . Peripheral edema     takes Lasix daily  . HTN (hypertension)     takes Cardura,Metoprolol,[name],and Amlodipine daily  . Arthritis   . GERD (gastroesophageal reflux disease)     takes Omeprazole daily  . History of colon polyps   . Type 2 diabetes, uncontrolled, with neuropathy ([place] 1989    takes Invokana daily and has an insulin pump ([name]  . 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 HCAP (healthcare-associated pneumonia)   . Hemiparesis affecting  right side as late effect of cerebrovascular accident ([place] 09/05/2014  . Anterior cerebral circulation hemorrhagic infarction ([facility] 11/24/2014    March, 2016, dominant left thalamic and left internal capsule ischemic infarct with resultant hemorrhagic transformation, secondary to small vessel disease. Resulted in right hemiparesis dysarthria and diplopia   //   readmission with aphasia July, 2016, this improved     Past Surgical History  Procedure Laterality Date  . Cataract extraction Bilateral 2010  . Elbow surgery Right 1997  . Balloon angioplasty, artery  1992, 2004    CAD, Dr. KRon Parker . Colonoscopy  04/03/2002    adenomatous polyp, int hemorrhoids  . Colonoscopy  07/18/2007    normal (Dr. SFuller Plan  . Rotator cuff repair  09/2011    left, with subacromial decompression  . Nasal septum surgery    . Colonoscopy  09/26/2012    tubular adenoma, sm int hem, rpt 5 yrs (Fuller Plan  . Tonsillectomy and adenoidectomy    . Eye lids raised    . Cardiac catheterization  2004  . Polypectomy N/A 05/27/2013    Procedure: ENDOSCOPIC NASOPHARYNGEAL MASS;  Surgeon: DJerrell Belfast MD    There were no vitals filed for this visit.  Visit Diagnosis:  Right hemiparesis ([place]  Ataxia  Decreased coordination  Stiffness of right shoulder joint      Subjective Assessment - 05/28/15 1202    Subjective  "I talk to my  hand"   Patient is accompained by: Family member   Pertinent History see epic   Patient Stated Goals utilization of right arm "I wish I had more control of it", feel comfortable being around people   Currently in Pain? No/denies         Neurological Re-education Exercises    Shoulder Flexion  In sitting, mid-high range shoulder flex with vertical cane with min cues followed by low chest press with min cueing.    Other Grasp and Release Exercises  In sitting, low range functional reaching to grasp/release cylinder objects on table with  RUE with min cues for compensation and min difficulty which improved with repetition.     Other Weight-Bearing Exercises 1 Wt. Bearing through both UEs in cat/cow positions in modified quadraped with hands on raised mat with min facilitation/cues for incr scapular stability/mobility.  Weight bearing through R hand in sitting with body on arm movements with min cueing for incr scapula stabilization and shoulder positioning/alignment.    Wt. Bearing through R elbow with push to sitting for incr scapular stability and tricep activation.   Arm bike x44mn level 1 for reciprocal movement with R hand wrapped with min cues for shoulder hike initially.                         OT Education - 05/28/15 1202    Education Details Updated HEP--see pt instructions   Person(s) Educated Patient   Methods Explanation;Demonstration;Verbal cues;Handout   Comprehension Verbalized understanding;Returned demonstration;Verbal cues required          OT Short Term Goals - 04/30/15 1310    OT SHORT TERM GOAL #3   Title Pt will demo at least 55* R shoulder flex with min compensation in prep for functional reach.   Baseline 45*   Time 4   Period Weeks   Status Achieved  03/10/15:  50* with min-mod compensation/cueing; 04/13/15  50-55* with min-mod compensation inconsistent; 04/28/15  55-60* with min compensation   OT SHORT TERM GOAL #5   Title Pt will be able to grasp/release cylinder object independently using good control in 7/10 attempts without drops.--check 04/11/15   Time 4   Period Weeks   Status Achieved  04/13/15:  not consistent; 04/30/15 met along tabletop at approx this level.           OT Long Term Goals - 05/28/15 1205    OT LONG TERM GOAL #1   Title Pt will be independent with final HEP.--check LTGs 05/09/15   Time 8   Period Weeks   Status Revised  03/12/15:  not fully met/ongoing--continue for renewal period; 05/07/15 not performing consistently/updates  ongoing   OT LONG TERM GOAL #3   Title Pt will demo at least 60* R shoulder flex with min compensation in prep for functional reach.   Baseline 45*   Time 8   Period Weeks   Status Revised  03/10/15:  50* with min-mod compensation/cueing; revised 03/12/15;  not fully met 55-60*   OT LONG TERM GOAL #4   Title Pt will improve RUE functional reaching/coordination for ADLs as shown by improving score on box and blocks test to at least 15 blocks.--continue for renewal, check 05/09/15   Baseline 9 blocks   Time 8   Period Weeks   Status On-going  03/12/15:  10 blocks;  05/07/15:  10 blocks   OT LONG TERM GOAL #7   Title Pt will be able to grasp/release  cylinder object independently using good control in 9/10 attempts without drops with forearm supported on table.--check 04/11/15   Baseline 8   Period Weeks   Status On-going  05/07/15:  8/10               Plan - 05/28/15 1203    Clinical Impression Statement Pt verbalized understanding of updated HEP and demo decr compensation for AAROM.   Plan check LTGs and d/c next week   OT Home Exercise Plan Education provided:  initial HEP issued; 03/03/15 memory compensations, cognitive/visual HEP, coordination HEP issued; wt. bearing on elbow with push to sitting; updated HEP 05/28/15   Consulted and Agree with Plan of Care Patient        Problem List Patient Active Problem List   Diagnosis Date Noted  . Ear pain 03/23/2015  . Anterior cerebral circulation hemorrhagic infarction ([place]) 11/24/2014  . Hemiparesis affecting right side as late effect of cerebrovascular accident ([place]) 09/05/2014  . Depression due to stroke ([place]) 09/05/2014  . Thrombocytopenia ([place]) 06/05/2014  . Chronic diastolic heart failure, NYHA class 1 ([place]) 06/05/2014  . Ex-smoker 06/03/2014  . Benign paroxysmal positional vertigo 04/28/2014  . Right carotid bruit   . Polyneuropathy, diabetic ([place]) 08/05/2012  . Seasonal allergic rhinitis   . Trigger thumb of left hand  10/12/2011  . Gout   . Adenomatous polyps   . CAD (coronary artery disease)   . Essential hypertension   . Hyperlipidemia   . Type 2 diabetes, uncontrolled, with neuropathy ([place])   . Ejection fraction     [facility] 05/28/2015, 12:12 PM  [facility] [address] [place], [place], [address] Phone: [phone]   Fax:  [phone]  Name: Jeffrey [name] MRN: [id number] Date of Birth: 1940-10-26  Vianne Bulls, OTR/L [facility] [address] [place], [place]  [address] [phone] phone [phone] 05/28/2015 12:12 PM

## 2015-05-29 DIAGNOSIS — I69322 Dysarthria following cerebral infarction: Secondary | ICD-10-CM | POA: Diagnosis not present

## 2015-05-29 DIAGNOSIS — R600 Localized edema: Secondary | ICD-10-CM | POA: Diagnosis not present

## 2015-05-29 DIAGNOSIS — R279 Unspecified lack of coordination: Secondary | ICD-10-CM | POA: Diagnosis not present

## 2015-05-29 DIAGNOSIS — R278 Other lack of coordination: Secondary | ICD-10-CM | POA: Diagnosis not present

## 2015-05-29 DIAGNOSIS — R261 Paralytic gait: Secondary | ICD-10-CM | POA: Diagnosis not present

## 2015-05-29 DIAGNOSIS — M6281 Muscle weakness (generalized): Secondary | ICD-10-CM | POA: Diagnosis not present

## 2015-05-29 DIAGNOSIS — I639 Cerebral infarction, unspecified: Secondary | ICD-10-CM | POA: Diagnosis not present

## 2015-06-02 ENCOUNTER — Telehealth: Payer: Self-pay | Admitting: Family Medicine

## 2015-06-02 ENCOUNTER — Ambulatory Visit: Payer: PPO | Admitting: Physical Therapy

## 2015-06-02 ENCOUNTER — Ambulatory Visit: Payer: PPO | Admitting: Occupational Therapy

## 2015-06-02 DIAGNOSIS — Z7689 Persons encountering health services in other specified circumstances: Secondary | ICD-10-CM

## 2015-06-02 DIAGNOSIS — R278 Other lack of coordination: Secondary | ICD-10-CM

## 2015-06-02 DIAGNOSIS — R279 Unspecified lack of coordination: Secondary | ICD-10-CM

## 2015-06-02 DIAGNOSIS — R269 Unspecified abnormalities of gait and mobility: Secondary | ICD-10-CM | POA: Diagnosis not present

## 2015-06-02 DIAGNOSIS — R270 Ataxia, unspecified: Secondary | ICD-10-CM

## 2015-06-02 DIAGNOSIS — M25611 Stiffness of right shoulder, not elsewhere classified: Secondary | ICD-10-CM

## 2015-06-02 DIAGNOSIS — G8191 Hemiplegia, unspecified affecting right dominant side: Secondary | ICD-10-CM

## 2015-06-02 NOTE — Telephone Encounter (Signed)
filled and in Kim's box. 

## 2015-06-02 NOTE — Telephone Encounter (Signed)
In your IN box for completion.  

## 2015-06-02 NOTE — Therapy (Signed)
[facility] [address] [place], [place], [address] Phone: [phone]   Fax:  [phone]  Occupational Therapy Treatment  Patient Details  Name: Jeffrey [name] MRN: [id number] Date of Birth: 1940-08-12 Referring Provider: Dr. Leonie Man  Encounter Date: 06/02/2015    Past Medical History  Diagnosis Date  . CAD (coronary artery disease)     PCI distal RCA ...2004, residual 70% LAD   /   ...nuclear...03/2007...no ischemia.Marland KitchenMarland Kitchenpreserved LV /  nuclear...03/03/2009...inferior scar..no ischemia..EF 51%  . Dyslipidemia     takes Atorvastatin daily  . Internal hemorrhoids   . Gout     takes Allopurinol daily and Colchicine as needed  . Seasonal allergic rhinitis   . Right carotid bruit   . Cyst of nasopharynx     per ENT Wilburn Cornelia  . Myocardial infarction [facility]) 46yr ago  . Peripheral edema     takes Lasix daily  . HTN (hypertension)     takes Cardura,Metoprolol,[name],and Amlodipine daily  . Arthritis   . GERD (gastroesophageal reflux disease)     takes Omeprazole daily  . History of colon polyps   . Type 2 diabetes, uncontrolled, with neuropathy ([place] 1989    takes Invokana daily and has an insulin pump ([name]  . 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 HCAP (healthcare-associated pneumonia)   . Hemiparesis affecting right side as late effect of cerebrovascular accident ([place] 09/05/2014  . Anterior cerebral circulation hemorrhagic infarction ([facility] 11/24/2014    March, 2016, dominant left thalamic and left internal capsule ischemic infarct with resultant hemorrhagic transformation, secondary to small vessel disease. Resulted in right hemiparesis dysarthria and diplopia   //   readmission with aphasia July, 2016, this improved     Past Surgical History  Procedure Laterality Date  . Cataract extraction Bilateral 2010  . Elbow surgery Right 1997  . Balloon  angioplasty, artery  1992, 2004    CAD, Dr. KRon Parker . Colonoscopy  04/03/2002    adenomatous polyp, int hemorrhoids  . Colonoscopy  07/18/2007    normal (Dr. SFuller Plan  . Rotator cuff repair  09/2011    left, with subacromial decompression  . Nasal septum surgery    . Colonoscopy  09/26/2012    tubular adenoma, sm int hem, rpt 5 yrs (Fuller Plan  . Tonsillectomy and adenoidectomy    . Eye lids raised    . Cardiac catheterization  2004  . Polypectomy N/A 05/27/2013    Procedure: ENDOSCOPIC NASOPHARYNGEAL MASS;  Surgeon: DJerrell Belfast MD    There were no vitals filed for this visit.  Visit Diagnosis:  No diagnosis found.      Subjective Assessment - 06/02/15 1024    Subjective  "nothing new"   Patient is accompained by: Family member   Pertinent History see epic   Patient Stated Goals utilization of right arm "I wish I had more control of it", feel comfortable being around people   Currently in Pain? No/denies           Neurological Re-education Exercises    Shoulder Flexion  In sitting, mid-high range shoulder flex with vertical cane with min cues followed by low chest press with min cueing.    Other Grasp and Release Exercises  In sitting, low range functional reaching to grasp/release cylinder objects on table with RUE with min cues for compensation and min-mod difficulty today.   Flipping large cards for incr coordination/control with min cues for  compensation and for finger ext of 5th digit.    Other Weight-Bearing Exercises 1 Wt. Bearing through both UEs in cat/cow positions in modified quadraped with hands on raised mat with min facilitation/cues for incr scapular stability/mobility.   Self-care:  Began checking LTGs and discussing progress in prep for d/c next session--see goals section.  Pt verbalized understanding/agreement.                              OT Short Term  Goals - 04/30/15 1310    OT SHORT TERM GOAL #3   Title Pt will demo at least 55* R shoulder flex with min compensation in prep for functional reach.   Baseline 45*   Time 4   Period Weeks   Status Achieved  03/10/15:  50* with min-mod compensation/cueing; 04/13/15  50-55* with min-mod compensation inconsistent; 04/28/15  55-60* with min compensation   OT SHORT TERM GOAL #5   Title Pt will be able to grasp/release cylinder object independently using good control in 7/10 attempts without drops.--check 04/11/15   Time 4   Period Weeks   Status Achieved  04/13/15:  not consistent; 04/30/15 met along tabletop at approx this level.           OT Long Term Goals - 05/28/15 1205    OT LONG TERM GOAL #1   Title Pt will be independent with final HEP.--check LTGs 05/09/15   Time 8   Period Weeks   Status Revised  03/12/15:  not fully met/ongoing--continue for renewal period; 05/07/15 not performing consistently/updates ongoing   OT LONG TERM GOAL #3   Title Pt will demo at least 60* R shoulder flex with min compensation in prep for functional reach.   Baseline 45*   Time 8   Period Weeks   Status Revised  03/10/15:  50* with min-mod compensation/cueing; revised 03/12/15;  not fully met 55-60*   OT LONG TERM GOAL #4   Title Pt will improve RUE functional reaching/coordination for ADLs as shown by improving score on box and blocks test to at least 15 blocks.--continue for renewal, check 05/09/15   Baseline 9 blocks   Time 8   Period Weeks   Status On-going  03/12/15:  10 blocks;  05/07/15:  10 blocks   OT LONG TERM GOAL #7   Title Pt will be able to grasp/release cylinder object independently using good control in 9/10 attempts without drops with forearm supported on table.--check 04/11/15   Baseline 8   Period Weeks   Status On-going  05/07/15:  8/10               Problem List Patient Active Problem List   Diagnosis Date Noted  . Ear pain 03/23/2015  . Anterior cerebral circulation hemorrhagic  infarction ([place]) 11/24/2014  . Hemiparesis affecting right side as late effect of cerebrovascular accident ([place]) 09/05/2014  . Depression due to stroke ([place]) 09/05/2014  . Thrombocytopenia ([place]) 06/05/2014  . Chronic diastolic heart failure, NYHA class 1 ([place]) 06/05/2014  . Ex-smoker 06/03/2014  . Benign paroxysmal positional vertigo 04/28/2014  . Right carotid bruit   . Polyneuropathy, diabetic ([place]) 08/05/2012  . Seasonal allergic rhinitis   . Trigger thumb of left hand 10/12/2011  . Gout   . Adenomatous polyps   . CAD (coronary artery disease)   . Essential hypertension   . Hyperlipidemia   . Type 2 diabetes, uncontrolled, with neuropathy ([place])   . Ejection  fraction     [facility] 06/02/2015, 10:26 AM  [facility] [address] [place], [place], [address] Phone: [phone]   Fax:  [phone]  Name: ELOY FEHL MRN: [id number] Date of Birth: 07-Sep-1940   Vianne Bulls, OTR/L [facility] [address] [place], [place]  [address] [phone] phone [phone] 06/02/2015 10:26 AM

## 2015-06-02 NOTE — Telephone Encounter (Signed)
Pt daughter dropped off [place] [employer] Assistance forms to be completed.  In prescription tower for Maudie Mercury to pick up  Call [name] (daughter) when complete [phone]

## 2015-06-03 NOTE — Telephone Encounter (Signed)
Patient's daughter notified and forms placed up front for pick up.

## 2015-06-05 ENCOUNTER — Ambulatory Visit: Payer: PPO | Admitting: Physical Therapy

## 2015-06-05 ENCOUNTER — Ambulatory Visit: Payer: PPO | Admitting: Occupational Therapy

## 2015-06-05 DIAGNOSIS — R269 Unspecified abnormalities of gait and mobility: Secondary | ICD-10-CM | POA: Diagnosis not present

## 2015-06-05 DIAGNOSIS — M25611 Stiffness of right shoulder, not elsewhere classified: Secondary | ICD-10-CM

## 2015-06-05 DIAGNOSIS — I69351 Hemiplegia and hemiparesis following cerebral infarction affecting right dominant side: Secondary | ICD-10-CM

## 2015-06-05 NOTE — Therapy (Signed)
[facility] [address] [place], [place], [address] Phone: [phone]   Fax:  [phone]  Occupational Therapy Treatment  Patient Details  Name: Jeffrey [name] MRN: [id number] Date of Birth: 1940/04/17 Referring Provider: Dr. Leonie Man  Encounter Date: 06/05/2015      OT End of Session - 06/05/15 1705    Visit Number 34   Number of Visits 35   Date for OT Re-Evaluation 05/09/15   Authorization Type [employer] H[employer], no visit limit/no auth, G-code   Authorization Time Period renewal completed 05/11/88, cert period 04/11/07-09/07/51, wk 3/4   Authorization - Visit Number 8   Authorization - Number of Visits 10   OT Start Time 1021   OT Stop Time 1100   OT Time Calculation (min) 39 min   Activity Tolerance Patient tolerated treatment well   Behavior During Therapy [facility] for tasks assessed/performed      Past Medical History  Diagnosis Date  . CAD (coronary artery disease)     PCI distal RCA ...2004, residual 70% LAD   /   ...nuclear...03/2007...no ischemia.Marland KitchenMarland Kitchenpreserved LV /  nuclear...03/03/2009...inferior scar..no ischemia..EF 51%  . Dyslipidemia     takes Atorvastatin daily  . Internal hemorrhoids   . Gout     takes Allopurinol daily and Colchicine as needed  . Seasonal allergic rhinitis   . Right carotid bruit   . Cyst of nasopharynx     per ENT Wilburn Cornelia  . Myocardial infarction [facility]) 35yr ago  . Peripheral edema     takes Lasix daily  . HTN (hypertension)     takes Cardura,Metoprolol,[name],and Amlodipine daily  . Arthritis   . GERD (gastroesophageal reflux disease)     takes Omeprazole daily  . History of colon polyps   . Type 2 diabetes, uncontrolled, with neuropathy ([facility] 1989    takes Invokana daily and has an insulin pump ([name]  . 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 HCAP (healthcare-associated pneumonia)   . Hemiparesis affecting right  side as late effect of cerebrovascular accident ([facility] 09/05/2014  . Anterior cerebral circulation hemorrhagic infarction ([facility] 11/24/2014    March, 2016, dominant left thalamic and left internal capsule ischemic infarct with resultant hemorrhagic transformation, secondary to small vessel disease. Resulted in right hemiparesis dysarthria and diplopia   //   readmission with aphasia July, 2016, this improved     Past Surgical History  Procedure Laterality Date  . Cataract extraction Bilateral 2010  . Elbow surgery Right 1997  . Balloon angioplasty, artery  1992, 2004    CAD, Dr. KRon Parker . Colonoscopy  04/03/2002    adenomatous polyp, int hemorrhoids  . Colonoscopy  07/18/2007    normal (Dr. SFuller Plan  . Rotator cuff repair  09/2011    left, with subacromial decompression  . Nasal septum surgery    . Colonoscopy  09/26/2012    tubular adenoma, sm int hem, rpt 5 yrs ([facility] Plan  . Tonsillectomy and adenoidectomy    . Eye lids raised    . Cardiac catheterization  2004  . Polypectomy N/A 05/27/2013    Procedure: ENDOSCOPIC NASOPHARYNGEAL MASS;  Surgeon: DJerrell Belfast MD    There were no vitals filed for this visit.  Visit Diagnosis:  Stiffness of right shoulder joint  Hemiparesis affecting right side as late effect of cerebrovascular accident ([facility]      Subjective Assessment - 06/05/15 1714    Patient is accompained by: Family  member   Pertinent History see epic   Patient Stated Goals utilization of right arm "I wish I had more control of it", feel comfortable being around people   Currently in Pain? No/denies      Neurological Re-education Exercises                      Shoulder Flexion           In sitting, mid-high range shoulder flex with PVC pipe frame with min cues followed by low chest press with min cueing.              Other Grasp and Release Exercises          In sitting, low range functional reaching to grasp/release cylinder objects on table with RUE with min cues for  compensation and min-mod difficulty today.    Flipping large cards for incr coordination/control with min cues for compensation and for finger ext of 5th digit.              Self-care:  Finished checking LTGs and discussing progress in prep for d/c--see goals section.  Pt verbalized understanding/agreement.                            OT Short Term Goals - 06/05/15 1045    OT SHORT TERM GOAL #1   Title Pt will be independent with initial HEP.--check STGs 02/10/15   Status Achieved   OT SHORT TERM GOAL #2   Title Pt will use RUE as gross A/stabilizer at least 25% of the time for ADLs.   Status Achieved   OT SHORT TERM GOAL #3   Title Pt will demo at least 55* R shoulder flex with min compensation in prep for functional reach.   Baseline 45*   Time 4   Period Weeks   Status Achieved  03/10/15:  50* with min-mod compensation/cueing; 04/13/15  50-55* with min-mod compensation inconsistent; 04/28/15  55-60* with min compensation   OT SHORT TERM GOAL #4   Title Pt will be able to complete clothing fasteners (buttons, tying shoes) mod I using AE prn.   Time 4   Status Achieved   OT SHORT TERM GOAL #5   Title Pt will be able to grasp/release cylinder object independently using good control in 7/10 attempts without drops.--check 04/11/15   Time 4   Period Weeks   Status Achieved  04/13/15:  not consistent; 04/30/15 met along tabletop at approx this level.           OT Long Term Goals - 06/05/15 1046    OT LONG TERM GOAL #1   Title Pt will be independent with final HEP.--check LTGs 05/09/15   Time 8   Period Weeks   Status Achieved  03/12/15:  not fully met/ongoing--continue for renewal period; 05/07/15 not performing consistently/updates ongoing;   06/02/15  met   OT LONG TERM GOAL #2   Title Pt will use RUE as gross A/stabilizer for ADLs at least 50% of the time.   Status Achieved   OT LONG TERM GOAL #3   Title Pt will demo at least 60* R shoulder flex with min  compensation in prep for functional reach.   Baseline 45*   Time 8   Period Weeks   Status Achieved  03/10/15:  50* with min-mod compensation/cueing; revised 03/12/15;  not fully met 55-60*; 06/02/15 60-65* with min compensation   OT  LONG TERM GOAL #4   Title Pt will improve RUE functional reaching/coordination for ADLs as shown by improving score on box and blocks test to at least 15 blocks.--continue for renewal, check 05/09/15   Baseline 9 blocks   Time 8   Period Weeks   Status Not Met  06/05/15-11 blocks   OT LONG TERM GOAL #6   Title Pt will verbalize understanding of cognitive/visual compensation strategies and activities to improve cognition/vision prn.   Status Achieved   OT LONG TERM GOAL #7   Title Pt will be able to grasp/release cylinder object independently using good control in 9/10 attempts without drops with forearm supported on table.--check 04/11/15   Baseline 8   Period Weeks   Status Not Met  05/07/15:  8/10;  06/02/15  not consistent   OT LONG TERM GOAL #8   Title Pt will be able to write name with RUE with at least 75% legiblity using AE prn.   Status Deferred               Plan - 06/05/15 1704    Clinical Impression Statement Pt agrees with plans for discharge today.   Pt will benefit from skilled therapeutic intervention in order to improve on the following deficits (Retired) Decreased cognition;Decreased mobility;Decreased strength;Impaired vision/preception;Impaired UE functional use;Decreased knowledge of use of DME;Decreased balance;Impaired tone;Impaired sensation;Decreased coordination;Decreased range of motion   Rehab Potential Good   Clinical Impairments Affecting Rehab Potential severity of deficits, sensory deficits, ataxia   OT Frequency 2x / week   OT Duration 4 weeks   OT Treatment/Interventions Self-care/ADL training;Therapeutic exercise;Functional Mobility Training;Patient/family education;Ultrasound;Neuromuscular education;Manual  Therapy;Splinting;Therapeutic exercises;Cryotherapy;Parrafin;DME and/or AE instruction;Therapeutic activities;Cognitive remediation/compensation;Visual/perceptual remediation/compensation;Passive range of motion;Moist Heat;Contrast Bath;Fluidtherapy;Electrical Stimulation   Plan d/c    OT Home Exercise Plan Education provided:  initial HEP issued; 03/03/15 memory compensations, cognitive/visual HEP, coordination HEP issued; wt. bearing on elbow with push to sitting; updated HEP 05/28/15   Consulted and Agree with Plan of Care Patient   Family Member Consulted wife       OCCUPATIONAL THERAPY DISCHARGE SUMMARY    Current functional level related to goals / functional outcomes: See above, pt made good overall progress yet was limited by weakness and ataxia.   Remaining deficits: Decreased strength, decreased coordination, ataxia, decreased balance   Education / Equipment: Pt was educated regarding HEP. Pt verbalized understanding. Pt is being discharged due to reaching maximum rehab potential at this time. Plan: Patient agrees to discharge.  Patient goals were partially met.                                                                                                      ?????      Problem List Patient Active Problem List   Diagnosis Date Noted  . Ear pain 03/23/2015  . Anterior cerebral circulation hemorrhagic infarction ([place]) 11/24/2014  . Hemiparesis affecting right side as late effect of cerebrovascular accident ([place]) 09/05/2014  . Depression due to stroke ([place]) 09/05/2014  . Thrombocytopenia ([place]) 06/05/2014  . Chronic diastolic heart failure, NYHA class 1 ([place])  06/05/2014  . Ex-smoker 06/03/2014  . Benign paroxysmal positional vertigo 04/28/2014  . Right carotid bruit   . Polyneuropathy, diabetic ([place]) 08/05/2012  . Seasonal allergic rhinitis   . Trigger thumb of left hand 10/12/2011  . Gout   . Adenomatous polyps   . CAD (coronary artery disease)   . Essential  hypertension   . Hyperlipidemia   . Type 2 diabetes, uncontrolled, with neuropathy ([place])   . Ejection fraction     RINE,KATHRYN 06/05/2015, 5:14 PM Theone Murdoch, OTR/L [phone] Phone: [phone] 5:14 PM 06/05/2015 [facility] [address] [place], [place], [address] Phone: [phone]   Fax:  [phone]  Name: Jeffrey [name] MRN: [id number] Date of Birth: 20-Aug-1940

## 2015-06-08 ENCOUNTER — Encounter: Payer: PPO | Admitting: Occupational Therapy

## 2015-06-08 ENCOUNTER — Ambulatory Visit: Payer: PPO | Admitting: Rehabilitation

## 2015-06-16 ENCOUNTER — Other Ambulatory Visit: Payer: Self-pay | Admitting: Neurology

## 2015-06-16 ENCOUNTER — Encounter: Payer: Self-pay | Admitting: Neurology

## 2015-06-16 ENCOUNTER — Telehealth: Payer: Self-pay | Admitting: Neurology

## 2015-06-16 ENCOUNTER — Ambulatory Visit (INDEPENDENT_AMBULATORY_CARE_PROVIDER_SITE_OTHER): Payer: PPO | Admitting: Neurology

## 2015-06-16 ENCOUNTER — Other Ambulatory Visit: Payer: Self-pay

## 2015-06-16 VITALS — BP 141/81 | HR 63 | Ht 68.0 in | Wt 163.8 lb

## 2015-06-16 DIAGNOSIS — G8110 Spastic hemiplegia affecting unspecified side: Secondary | ICD-10-CM

## 2015-06-16 MED ORDER — BACLOFEN 10 MG PO TABS: 5.0000 mg | ORAL_TABLET | Freq: Three times a day (TID) | ORAL | Status: DC

## 2015-06-16 NOTE — Patient Instructions (Signed)
I had a long d/w patient, wife and daughter about his recent stroke, risk for recurrent stroke/TIAs, personally independently reviewed imaging studies and stroke evaluation results and answered questions.Continue Plavix  for secondary stroke prevention and maintain strict control of hypertension with blood pressure goal below 130/90, diabetes with hemoglobin A1c goal below 6.5% and lipids with LDL cholesterol goal below 70 mg/dL..I also discussed fall risk prevention precautions and advised him to use his walker at all times. I also recommend he increase the baclofen dose to 5 mg 3 times daily to help with his post stroke spasticity and referred to Dr. Krista Blue for consideration for Botox for spasticity to improve his walking. Followup in the future with me only as necessary. Fall Prevention in the Home  Falls can cause injuries. They can happen to people of all ages. There are many things you can do to make your home safe and to help prevent falls.  WHAT CAN I DO ON THE OUTSIDE OF MY HOME?  Regularly fix the edges of walkways and driveways and fix any cracks.  Remove anything that might make you trip as you walk through a door, such as a raised step or threshold.  Trim any bushes or trees on the path to your home.  Use bright outdoor lighting.  Clear any walking paths of anything that might make someone trip, such as rocks or tools.  Regularly check to see if handrails are loose or broken. Make sure that both sides of any steps have handrails.  Any raised decks and porches should have guardrails on the edges.  Have any leaves, snow, or ice cleared regularly.  Use sand or salt on walking paths during winter.  Clean up any spills in your garage right away. This includes oil or grease spills. WHAT CAN I DO IN THE BATHROOM?   Use night lights.  Install grab bars by the toilet and in the tub and shower. Do not use towel bars as grab bars.  Use non-skid mats or decals in the tub or shower.  If  you need to sit down in the shower, use a plastic, non-slip stool.  Keep the floor dry. Clean up any water that spills on the floor as soon as it happens.  Remove soap buildup in the tub or shower regularly.  Attach bath mats securely with double-sided non-slip rug tape.  Do not have throw rugs and other things on the floor that can make you trip. WHAT CAN I DO IN THE BEDROOM?  Use night lights.  Make sure that you have a light by your bed that is easy to reach.  Do not use any sheets or blankets that are too big for your bed. They should not hang down onto the floor.  Have a firm chair that has side arms. You can use this for support while you get dressed.  Do not have throw rugs and other things on the floor that can make you trip. WHAT CAN I DO IN THE KITCHEN?  Clean up any spills right away.  Avoid walking on wet floors.  Keep items that you use a lot in easy-to-reach places.  If you need to reach something above you, use a strong step stool that has a grab bar.  Keep electrical cords out of the way.  Do not use floor polish or wax that makes floors slippery. If you must use wax, use non-skid floor wax.  Do not have throw rugs and other things on the floor that  can make you trip. WHAT CAN I DO WITH MY STAIRS?  Do not leave any items on the stairs.  Make sure that there are handrails on both sides of the stairs and use them. Fix handrails that are broken or loose. Make sure that handrails are as long as the stairways.  Check any carpeting to make sure that it is firmly attached to the stairs. Fix any carpet that is loose or worn.  Avoid having throw rugs at the top or bottom of the stairs. If you do have throw rugs, attach them to the floor with carpet tape.  Make sure that you have a light switch at the top of the stairs and the bottom of the stairs. If you do not have them, ask someone to add them for you. WHAT ELSE CAN I DO TO HELP PREVENT FALLS?  Wear shoes  that:  Do not have high heels.  Have rubber bottoms.  Are comfortable and fit you well.  Are closed at the toe. Do not wear sandals.  If you use a stepladder:  Make sure that it is fully opened. Do not climb a closed stepladder.  Make sure that both sides of the stepladder are locked into place.  Ask someone to hold it for you, if possible.  Clearly mark and make sure that you can see:  Any grab bars or handrails.  First and last steps.  Where the edge of each step is.  Use tools that help you move around (mobility aids) if they are needed. These include:  Canes.  Walkers.  Scooters.  Crutches.  Turn on the lights when you go into a dark area. Replace any light bulbs as soon as they burn out.  Set up your furniture so you have a clear path. Avoid moving your furniture around.  If any of your floors are uneven, fix them.  If there are any pets around you, be aware of where they are.  Review your medicines with your doctor. Some medicines can make you feel dizzy. This can increase your chance of falling. Ask your doctor what other things that you can do to help prevent falls.   This information is not intended to replace advice given to you by your health care provider. Make sure you discuss any questions you have with your health care provider.   Document Released: 12/18/2008 Document Revised: 07/08/2014 Document Reviewed: 03/28/2014 Elsevier Interactive Patient Education [employer].

## 2015-06-16 NOTE — Telephone Encounter (Signed)
Rn call Vicente Males at [facility]. Vicente Males stated the baclofen prescription was last filled June 2016 at their pharmacy. Rn stated the MD change the prescription and a hand prescription was given to the daughter. Rn stated the daughter will be getting it filled at the [place]. Rn stated they do not need to filled the prescription.

## 2015-06-16 NOTE — Progress Notes (Signed)
[facility] [address] [place]. [place] [address] [id number]       OFFICE FOLLOW-UP NOTE  Jeffrey [name] Date of Birth:  1940/09/22 Medical Record Number:  [id number]   HPI: Mr Jeffrey [name] is a 4 year Caucasian male seen today for first office follow-up visit following hospital admission for stroke in March 2016.Jeffrey [name] is an 75 y.o. male with a history of hypertension, hyperlipidemia, coronary artery disease and diabetes mellitus, brought to the emergency room after being found lying in his 43 at home. He was noted to have right-sided weakness and speech. He also appeared to be somewhat confused. Code stroke was activated. It's not clear exactly when he was last known well, however. CT scan of his head showed an acute left thalamic and internal capsular ridge with intraventricular extension. Code stroke was subsequently canceled. NIH stroke score was 11. His last seen normal was unknown. He was not a PTA candidate given ICH, IVH. He was admitted to the intensive care unit where he had close neurological monitoring and blood pressure was tightly controlled. Follow-up imaging studies showed stable appearance of the hematoma and intraventricular hemorrhage hemorrhage. He did not need neurosurgical intervention. MRI scan of the brain showed a left, thalamic hemorrhage which probably represented hemorrhagic transformation of left thalamic and posterior corona radiata infarct. Intracranial MRA showed no large vessel stenosis or occlusion. Transthoracic echocardiogram showed normal ejection fraction. He had significant right hemiplegia sensory loss and ataxia. His transfer to inpatient rehabilitation where he was there for several weeks. He has subsequently finished rehabilitation stay and is currently at home. He is getting home occupational therapy yet but has been discharged from physical therapy. Patient is able to ambulate with great assistance and he needs a  platform walker with one person assist. He does not walk a lot because his wife is quite fragile and is scheduled to work with him. He still has significant weakness in his right hand the right leg strength is improved. His right hemisensory loss persists. He was readmitted in July with speech difficulties and was felt to be dehydrated. He was on aspirin at that time which was switched to Plavix which is tolerating well without bleeding or bruising. His blood pressure stays well controlled today does 127/64 in office. His fasting sugars are usually in the 100 range. He had hemobilia A1c and lipid profile checked last week at the [facility] for the results are not yet known. Update 06/16/2015 : He returns for follow-up last visit 6 months ago. He is accompanied by his wife and daughter. He has just recently been discharged from outpatient physical and occupation therapy. Is able to walk with a walker but cannot walk long distances and needs to rest. He still has significant right-sided weakness and ataxia and balance is poor. He has fortunately not had any major falls or injuries yet. He was started on baclofen but he never went beyond the starting dose and takes only 5 mg in the morning. Is tolerating it well without side effects. He states his blood pressure is well controlled and today it is 141/81. Is tolerating Plavix well without bleeding or bruising. His sugars have been under good control. He has not had his cholesterol checked recently but is tolerating Lipitor well without side effects. ROS:   14 system review of systems is positive for  weight loss, hearing loss, trouble swallowing, double vision , gait difficulty, incoordination, sensory loss and all other systems negative PMH:  Past Medical History  Diagnosis Date  . CAD (coronary artery disease)     PCI distal RCA ...2004, residual 70% LAD   /   ...nuclear...03/2007...no ischemia.Marland KitchenMarland Kitchenpreserved LV /  nuclear...03/03/2009...inferior scar..no  ischemia..EF 51%  . Dyslipidemia     takes Atorvastatin daily  . Internal hemorrhoids   . Gout     takes Allopurinol daily and Colchicine as needed  . Seasonal allergic rhinitis   . Right carotid bruit   . Cyst of nasopharynx     per ENT Wilburn Cornelia  . Myocardial infarction [facility]) 51yrs ago  . Peripheral edema     takes Lasix daily  . HTN (hypertension)     takes Cardura,Metoprolol,[name],and Amlodipine daily  . Arthritis   . GERD (gastroesophageal reflux disease)     takes Omeprazole daily  . History of colon polyps   . Type 2 diabetes, uncontrolled, with neuropathy ([place]) 1989    takes Invokana daily and has an insulin pump (Dr. [name])  . 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 HCAP (healthcare-associated pneumonia)   . Hemiparesis affecting right side as late effect of cerebrovascular accident ([place]) 09/05/2014  . Anterior cerebral circulation hemorrhagic infarction [facility]) 11/24/2014    March, 2016, dominant left thalamic and left internal capsule ischemic infarct with resultant hemorrhagic transformation, secondary to small vessel disease. Resulted in right hemiparesis dysarthria and diplopia   //   readmission with aphasia July, 2016, this improved   . Stroke [facility])     Social History:  Social History   Social History  . Marital Status: Married    Spouse Name: N/A  . Number of Children: 2  . Years of Education: N/A   Occupational History  . [employer]    Social History Main Topics  . Smoking status: Former Research scientist (life sciences)  . Smokeless tobacco: Never Used     Comment: quit smoking 45 years ago.  . Alcohol Use: No  . Drug Use: No  . Sexual Activity: Yes   Other Topics Concern  . Not on file   Social History Narrative   Caffeine: 6-7 cups coffee   Lives with wife, no pets   2 grown children, 4 grandchildren   Occu: Artist   Edu: [employer]   Activity: not much   Diet: plenty of water, fruits and  vegetables   Some care through [place]      Endo: Dr. Buddy Duty    Medications:   Current Outpatient Prescriptions on File Prior to Visit  Medication Sig Dispense Refill  . atorvastatin (LIPITOR) 80 MG tablet Take 80 mg by mouth at bedtime.    . Cholecalciferol (VITAMIN D3) 5000 UNITS TABS Take 5,000 Units by mouth once a week.    . clopidogrel (PLAVIX) 75 MG tablet Take 1 tablet (75 mg total) by mouth daily. 30 tablet 6  . fosinopril ([name]) 40 MG tablet Take 40 mg by mouth daily.    . furosemide (LASIX) 40 MG tablet Take 40 mg by mouth daily.    . insulin glargine (LANTUS) 100 UNIT/ML injection Inject 18 Units into the skin at bedtime.     . insulin lispro (HUMALOG) 100 UNIT/ML injection Inject 5 Units into the skin 3 (three) times daily before meals.     [name] LACTOBACILLUS PO Take 1 capsule by mouth daily. philips colon health    . loratadine (CLARITIN) 10 MG tablet Take 10 mg by mouth daily as needed.     [name]  metoprolol tartrate (LOPRESSOR) 25 MG tablet Take 25 mg by mouth 2 (two) times daily. Hold if B/P < 100/60 or HR< 60    . senna (SENOKOT) 8.6 MG tablet Take 1 tablet by mouth daily as needed. Reported on 02/23/2015    . sertraline (ZOLOFT) 25 MG tablet Take 1 tablet (25 mg total) by mouth daily. 30 tablet 3   No current facility-administered medications on file prior to visit.    Allergies:   Allergies  Allergen Reactions  . Niacin Other (See Comments)    REACTION: intolerant,not allergic="flushing,hot flashes,turning red"    Physical Exam General: frail elderly caucasian male seated, in no evident distress Head: head normocephalic and atraumatic.  Neck: supple with no carotid or supraclavicular bruits Cardiovascular: regular rate and rhythm, no murmurs Musculoskeletal: no deformity Skin:  no rash/petichiae Vascular:  Normal pulses all extremities Filed Vitals:   06/16/15 1426  BP: 141/81  Pulse: 63   Neurologic Exam Mental Status: Awake and fully alert. Oriented to place  and time. Recent and remote memory intact. Attention span, concentration and fund of knowledge appropriate. Mood and affect appropriate.  Cranial Nerves: Fundoscopic exam  Not done.. Pupils equal, briskly reactive to light. Extraocular movements full without nystagmus. Visual fields full to confrontation. Hearing intact. Facial sensation intact. Right lower face minimal weakness.Face, tongue, palate moves normally and symmetrically.  Motor: Right hemiparesis with right upper extremity 3/5 proximally and 3/5 in the right grip and intrinsic hand muscles. 4/5 strength in right lower extremity.. Spasticity in the right upper and lower extremity with increased tone compared to the left Sensory.: intact to touch ,pinprick .position and vibratory sensation.  Coordination: Decreased right hemibody touch and pinprick sensation. Gait and Station:  needs one person assist  And walks with a spastic ataxic gait with circumduction of the right leg Reflexes: 2+ and symmetric and brisker on right. Toes downgoing.   NIHSS 7 Modified Rankin  4   ASSESSMENT: 89 year Caucasian male with left thalamic hemorrhagic infarct in March 2016 with residual spastic right hemiparesis,   ataxia and sensory loss. Vascular risk factors of hypertension, diabetes, hyperlipidemia and coronary artery disease.    PLAN: I had a long d/w patient, wife and daughter about his recent stroke, risk for recurrent stroke/TIAs, personally independently reviewed imaging studies and stroke evaluation results and answered questions.Continue Plavix  for secondary stroke prevention and maintain strict control of hypertension with blood pressure goal below 130/90, diabetes with hemoglobin A1c goal below 6.5% and lipids with LDL cholesterol goal below 70 mg/dL..I also discussed fall risk prevention precautions and advised him to use his walker at all times. I also recommend he increase the baclofen dose to 5 mg 3 times daily to help with his post stroke  spasticity and referred to Dr. Krista Blue for consideration for Botox for spasticity to improve his walking. Greater than 50% time during this 25 minute visit was spent on counseling and coordination of care about his spasticity and gait difficulties and stroke risk. Followup in the future with me only as necessary.  Antony Contras, MD Note: This document was prepared with digital dictation and possible smart phrase technology. Any transcriptional errors that result from this process are unintentional

## 2015-06-16 NOTE — Telephone Encounter (Signed)
[facility] [phone] called to get clarification on directions for baclofen (LIORESAL) 10 MG tablet that was just received.

## 2015-06-16 NOTE — Telephone Encounter (Signed)
Patient sign a release form to get office notes for the last two visits. Pt given copies of visits to be given to the [place]. Pt was also given prescription printout of baclofen .Pt gets the medications filled at the [place].

## 2015-06-17 DIAGNOSIS — H532 Diplopia: Secondary | ICD-10-CM | POA: Diagnosis not present

## 2015-06-17 LAB — HM DIABETES EYE EXAM

## 2015-06-24 ENCOUNTER — Encounter: Payer: Medicare Other | Admitting: Family Medicine

## 2015-06-29 ENCOUNTER — Ambulatory Visit (INDEPENDENT_AMBULATORY_CARE_PROVIDER_SITE_OTHER): Payer: PPO | Admitting: Cardiology

## 2015-06-29 ENCOUNTER — Encounter: Payer: Self-pay | Admitting: Cardiology

## 2015-06-29 VITALS — BP 120/70 | HR 61 | Ht 68.0 in | Wt 164.0 lb

## 2015-06-29 DIAGNOSIS — R278 Other lack of coordination: Secondary | ICD-10-CM | POA: Diagnosis not present

## 2015-06-29 DIAGNOSIS — R600 Localized edema: Secondary | ICD-10-CM | POA: Diagnosis not present

## 2015-06-29 DIAGNOSIS — R279 Unspecified lack of coordination: Secondary | ICD-10-CM | POA: Diagnosis not present

## 2015-06-29 DIAGNOSIS — I638 Other cerebral infarction: Secondary | ICD-10-CM | POA: Diagnosis not present

## 2015-06-29 DIAGNOSIS — E785 Hyperlipidemia, unspecified: Secondary | ICD-10-CM | POA: Diagnosis not present

## 2015-06-29 DIAGNOSIS — I2510 Atherosclerotic heart disease of native coronary artery without angina pectoris: Secondary | ICD-10-CM | POA: Diagnosis not present

## 2015-06-29 DIAGNOSIS — I639 Cerebral infarction, unspecified: Secondary | ICD-10-CM | POA: Diagnosis not present

## 2015-06-29 DIAGNOSIS — I69322 Dysarthria following cerebral infarction: Secondary | ICD-10-CM | POA: Diagnosis not present

## 2015-06-29 DIAGNOSIS — I10 Essential (primary) hypertension: Secondary | ICD-10-CM

## 2015-06-29 DIAGNOSIS — R261 Paralytic gait: Secondary | ICD-10-CM | POA: Diagnosis not present

## 2015-06-29 DIAGNOSIS — M6281 Muscle weakness (generalized): Secondary | ICD-10-CM | POA: Diagnosis not present

## 2015-06-29 DIAGNOSIS — I6389 Other cerebral infarction: Secondary | ICD-10-CM

## 2015-06-29 NOTE — Patient Instructions (Signed)

## 2015-06-29 NOTE — Progress Notes (Signed)
Cardiology Office Note   Date:  06/29/2015   ID:  Jeffrey [name], DOB 12-Apr-1940, MRN [id number]  PCP:  [name], MD  Cardiologist:   Candee Furbish, MD       History of Present Illness: Jeffrey [name] is a 75 y.o. male prior patient of Dr. Ron Parker here to establish care who followed him for many years who has coronary artery disease status post PCI (he thinks PTCA-do not have report) with normal left ventricular function.  Unfortunately in March 2016 he had a stroke, hemorrhagic transformation, significant right hemiparesis come a rehabilitation and later returned with an episode of A. fib which improved. He had been on Plavix. No history of atrial fibrillation. He is a English as a second language teacher. Neurology did not feel the need for implantable loop recorder. He would be at increased risk for anticoagulation given hemorrhagic transformation.  Here with his family. He is retired [employer]. He is having no chest pain, no syncope, no shortness of breath, no anginal symptoms. In a wheelchair. Out of rehabilitation. Overall pleased with his progress.    Past Medical History  Diagnosis Date  . CAD (coronary artery disease)     PCI distal RCA ...2004, residual 70% LAD   /   ...nuclear...03/2007...no ischemia.Marland KitchenMarland Kitchenpreserved LV /  nuclear...03/03/2009...inferior scar..no ischemia..EF 51%  . Dyslipidemia     takes Atorvastatin daily  . Internal hemorrhoids   . Gout     takes Allopurinol daily and Colchicine as needed  . Seasonal allergic rhinitis   . Right carotid bruit   . Cyst of nasopharynx     per ENT Wilburn Cornelia  . Myocardial infarction [facility]) 11yrs ago  . Peripheral edema     takes Lasix daily  . HTN (hypertension)     takes Cardura,Metoprolol,[name],and Amlodipine daily  . Arthritis   . GERD (gastroesophageal reflux disease)     takes Omeprazole daily  . History of colon polyps   . Type 2 diabetes, uncontrolled, with neuropathy ([place]) 1989    takes Invokana daily and has an insulin  pump (Dr. [name])  . 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 HCAP (healthcare-associated pneumonia)   . Hemiparesis affecting right side as late effect of cerebrovascular accident ([place]) 09/05/2014  . Anterior cerebral circulation hemorrhagic infarction [facility]) 11/24/2014    March, 2016, dominant left thalamic and left internal capsule ischemic infarct with resultant hemorrhagic transformation, secondary to small vessel disease. Resulted in right hemiparesis dysarthria and diplopia   //   readmission with aphasia July, 2016, this improved   . Stroke [facility])     Past Surgical History  Procedure Laterality Date  . Cataract extraction Bilateral 2010  . Elbow surgery Right 1997  . Balloon angioplasty, artery  1992, 2004    CAD, Dr. Ron Parker  . Colonoscopy  04/03/2002    adenomatous polyp, int hemorrhoids  . Colonoscopy  07/18/2007    normal (Dr. Fuller Plan)  . Rotator cuff repair  09/2011    left, with subacromial decompression  . Nasal septum surgery    . Colonoscopy  09/26/2012    tubular adenoma, sm int hem, rpt 5 yrs Fuller Plan)  . Tonsillectomy and adenoidectomy    . Eye lids raised    . Cardiac catheterization  2004  . Polypectomy N/A 05/27/2013    Procedure: ENDOSCOPIC NASOPHARYNGEAL MASS;  Surgeon: [name], MD     Current Outpatient Prescriptions  Medication Sig Dispense Refill  . atorvastatin (LIPITOR) 80  MG tablet Take 80 mg by mouth at bedtime.    . baclofen (LIORESAL) 10 MG tablet Take 0.5 tablets (5 mg total) by mouth 3 (three) times daily. 60 each 3  . Cholecalciferol (VITAMIN D3) 5000 UNITS TABS Take 5,000 Units by mouth once a week.    . clopidogrel (PLAVIX) 75 MG tablet Take 1 tablet (75 mg total) by mouth daily. 30 tablet 6  . fosinopril ([name]) 40 MG tablet Take 40 mg by mouth daily.    . furosemide (LASIX) 40 MG tablet Take 40 mg by mouth daily.    . insulin glargine (LANTUS) 100 UNIT/ML injection Inject 18 Units into the skin  at bedtime.     . insulin lispro (HUMALOG) 100 UNIT/ML injection Inject 5 Units into the skin 3 (three) times daily before meals.     [name] LACTOBACILLUS PO Take 1 capsule by mouth daily. philips colon health    . loratadine (CLARITIN) 10 MG tablet Take 10 mg by mouth daily as needed.     . metoprolol tartrate (LOPRESSOR) 25 MG tablet Take 25 mg by mouth 2 (two) times daily. Hold if B/P < 100/60 or HR< 60    . Pneumococcal Vac Polyvalent (PNEUMOVAX 23 IJ) Inject as directed.    . senna (SENOKOT) 8.6 MG tablet Take 1 tablet by mouth daily as needed for constipation. Reported on 02/23/2015    . sertraline (ZOLOFT) 25 MG tablet Take 1 tablet (25 mg total) by mouth daily. 30 tablet 3  . UNABLE TO FIND Flu vaccination 12/08/2014     No current facility-administered medications for this visit.    Allergies:   Niacin    Social History:  The patient  reports that he has quit smoking. He has never used smokeless tobacco. He reports that he does not drink alcohol or use illicit drugs.   Family History:  The patient's family history includes Alcohol abuse in his father; Heart attack in his father and son. There is no history of Coronary artery disease, Stroke, Cancer, Diabetes, or Colon cancer.    ROS:  Please see the history of present illness.  No bleeding Otherwise, review of systems are positive for none.   All other systems are reviewed and negative.    PHYSICAL EXAM: VS:  BP 120/70 mmHg  Pulse 61  Ht 5\' 8"  (1.727 m)  Wt 164 lb (74.39 kg)  BMI 24.94 kg/m2 , BMI Body mass index is 24.94 kg/(m^2). GEN: Well nourished, well developed, in no acute distress HEENT: normal Neck: no JVD, carotid bruits, or masses Cardiac: RRR; no murmurs, rubs, or gallops,no edema  Respiratory:  clear to auscultation bilaterally, normal work of breathing GI: soft, nontender, nondistended, + BS MS: no deformity or atrophy Skin: warm and dry, no rash Neuro:  Right-sided weakness, wheelchair Psych: euthymic mood,  full affect   EKG: None today, previously monitoring in hospital showed no evidence of atrial fibrillation.  ECHO: 06/09/14: - Left ventricle: The cavity size was normal. Wall thickness was normal. Systolic function was normal. The estimated ejection fraction was in the range of 55% to 60%. Regional wall motion abnormalities cannot be excluded. Doppler parameters are consistent with abnormal left ventricular relaxation (grade 1 diastolic dysfunction).   Recent Labs: 09/05/2014: TSH 0.66 09/13/2014: ALT 26; BUN 11; Creatinine, Ser 0.93; Hemoglobin 15.2; Platelets 144*; Potassium 3.4*; Sodium 138    Lipid Panel    Component Value Date/Time   CHOL 145 09/14/2014 0550   CHOL 211 02/01/2011   TRIG  216* 09/14/2014 0550   TRIG 342 09/30/2013   TRIG 342 09/30/2013   TRIG 522 02/01/2011   TRIG 522 02/01/2011   HDL 29* 09/14/2014 0550   CHOLHDL 5.0 09/14/2014 0550   VLDL 43* 09/14/2014 0550   LDLCALC 73 09/14/2014 0550   LDLCALC 98 09/30/2013   LDLCALC 98 09/30/2013   LDLDIRECT 89.6 02/07/2012 1457      Wt Readings from Last 3 Encounters:  06/29/15 164 lb (74.39 kg)  06/16/15 163 lb 12.8 oz (74.299 kg)  03/25/15 162 lb 6.4 oz (73.664 kg)      Other studies Reviewed: Additional studies/ records that were reviewed today include: Hospital records reviewed, lab work reviewed Review of the above records demonstrates: As above   ASSESSMENT AND PLAN:  Coronary artery disease status post PCI/PTCA of distal RCA 2004  - Stable, doing well, no anginal symptoms.  - 2010 nuclear stress test showed inferior scar, no ischemia  - Ejection fraction normal on echo in April 2016.  - Continuing with Plavix post stroke.  Stroke with hemorrhagic transformation, right-sided hemiparesis  - Plavix, no bleeding.   - Reviewed note from Dr. Leonie Man  - Continue to optimize diabetes, hyperlipidemia, blood pressure targets. Doing well.   Essential hypertension  - Normal today.  -  Medications reviewed. Doing well.  Hyperlipidemia  - LDL in the 60s. Excellent.  - Continue with high-dose atorvastatin.  Diabetes with polyneuropathy -Dr. Buddy Duty, hemoglobin A1c 6.6 diabetic polyneuropathy. That and 1.02. TSH 0.97. Triglycerides have been elevated 500 in the past.   Current medicines are reviewed at length with the patient today.  The patient does not have concerns regarding medicines.  The following changes have been made:  no change  Labs/ tests ordered today include:  No orders of the defined types were placed in this encounter.     Disposition:   FU with [facility] in 12 months  Signed, Candee Furbish, MD  06/29/2015 11:14 AM    [address], [place], [place]  [address] Phone: [phone]; Fax: [phone]

## 2015-06-30 ENCOUNTER — Encounter: Payer: Self-pay | Admitting: Gastroenterology

## 2015-07-01 ENCOUNTER — Encounter: Payer: Medicare Other | Admitting: Family Medicine

## 2015-07-06 ENCOUNTER — Ambulatory Visit: Payer: PPO | Admitting: Neurology

## 2015-07-06 ENCOUNTER — Telehealth: Payer: Self-pay | Admitting: *Deleted

## 2015-07-06 NOTE — Telephone Encounter (Signed)
Called to cancel the morning of appt - his wife is sick and needed to see a doctor.

## 2015-07-12 ENCOUNTER — Other Ambulatory Visit: Payer: Self-pay | Admitting: Family Medicine

## 2015-07-12 DIAGNOSIS — E785 Hyperlipidemia, unspecified: Secondary | ICD-10-CM

## 2015-07-12 DIAGNOSIS — D696 Thrombocytopenia, unspecified: Secondary | ICD-10-CM

## 2015-07-12 DIAGNOSIS — IMO0002 Reserved for concepts with insufficient information to code with codable children: Secondary | ICD-10-CM

## 2015-07-12 DIAGNOSIS — E1140 Type 2 diabetes mellitus with diabetic neuropathy, unspecified: Secondary | ICD-10-CM

## 2015-07-12 DIAGNOSIS — Z125 Encounter for screening for malignant neoplasm of prostate: Secondary | ICD-10-CM

## 2015-07-12 DIAGNOSIS — I10 Essential (primary) hypertension: Secondary | ICD-10-CM

## 2015-07-12 DIAGNOSIS — E1165 Type 2 diabetes mellitus with hyperglycemia: Secondary | ICD-10-CM

## 2015-07-12 DIAGNOSIS — M109 Gout, unspecified: Secondary | ICD-10-CM

## 2015-07-14 ENCOUNTER — Ambulatory Visit: Payer: PPO | Admitting: Podiatry

## 2015-07-15 ENCOUNTER — Encounter: Payer: Self-pay | Admitting: Neurology

## 2015-07-15 ENCOUNTER — Ambulatory Visit (INDEPENDENT_AMBULATORY_CARE_PROVIDER_SITE_OTHER): Payer: PPO | Admitting: Neurology

## 2015-07-15 ENCOUNTER — Telehealth: Payer: Self-pay

## 2015-07-15 VITALS — BP 151/75 | HR 58 | Ht 68.0 in | Wt 164.0 lb

## 2015-07-15 DIAGNOSIS — G8110 Spastic hemiplegia affecting unspecified side: Secondary | ICD-10-CM | POA: Diagnosis not present

## 2015-07-15 DIAGNOSIS — I638 Other cerebral infarction: Secondary | ICD-10-CM | POA: Diagnosis not present

## 2015-07-15 DIAGNOSIS — H532 Diplopia: Secondary | ICD-10-CM

## 2015-07-15 DIAGNOSIS — I6389 Other cerebral infarction: Secondary | ICD-10-CM

## 2015-07-15 NOTE — Patient Instructions (Signed)
[email or web address]  Ventura Bruns, M.D. Associate Professor, Ophthalmology [facility]) Neurology Clinical Interests Diplopia, Cataract Surgery in Complicated Eyes, Neuro-ophthalmology, Ocular Motility Disorders, Optic Nerve Disorders, Pupillary Disorders, Neurosurgical and Neurologic [place] Returning Patient Appointments: [phone] Department: [phone]

## 2015-07-15 NOTE — Progress Notes (Signed)
PATIENT: Jeffrey [name] DOB: 05/28/1940  Chief Complaint  Patient presents with  . Cerebrovascular Accident    "Jeffrey [name]" is here with his daughter, [name], to discuss possible Botox treatments for his right-sided, spastic hemiplegia.     HISTORICAL  Jeffrey [name] is a 75 years old right-handed male, accompanied by his daughter [name], seen in refer by Dr. Leonie Man for evaluation of possible Botox treatment for his right spastic hemiparesis, his primary care physician is Dr. [name].  He had a history of hypertension, hyperlipidemia, coronary artery disease, diabetes, He was admitted to the hospital in March 2016 for stroke, left being found lying in his carport at home for a few hours, he was noted to have right-sided weakness, slurred speech, confused, gait difficulty upon presentation.  We have personally reviewed CAT scan, and MRI of the brain in March 2016: Left thalamic hemorrhage stroke, probably representing hemorrhagic transformation of left thalamic/posterior coronal radiata ischemic infarction, mild mass effect, there is intraventricular bilateral subarachnoid hemorrhage, MRA of the brain showed no large vessel stenosis.  EKG showed normal ejection fraction, he was on aspirin upon presentation, later was switched to Plavix  He had prolonged rehabilitation, now he is back home, is able to stand up, take few steps with a walker, but only short distance, he has profound dysmetria of right upper and right lower extremity, he denies significant pain, he does have right facial right arm and leg numbness  He also suffered very bothersome double vision, even with his new prescription of prism added to his lens, he has difficulty reading,  REVIEW OF SYSTEMS: Full 14 system review of systems performed and notable only for double vision, hearing loss, snoring, allergy   ALLERGIES: Allergies  Allergen Reactions  . Niacin Other (See Comments)    REACTION: intolerant,not  allergic="flushing,hot flashes,turning red"    HOME MEDICATIONS: Current Outpatient Prescriptions  Medication Sig Dispense Refill  . atorvastatin (LIPITOR) 80 MG tablet Take 80 mg by mouth at bedtime.    . baclofen (LIORESAL) 10 MG tablet Take 0.5 tablets (5 mg total) by mouth 3 (three) times daily. 60 each 3  . Cholecalciferol (VITAMIN D3) 5000 UNITS TABS Take 5,000 Units by mouth once a week.    . clopidogrel (PLAVIX) 75 MG tablet Take 1 tablet (75 mg total) by mouth daily. 30 tablet 6  . fosinopril ([name]) 40 MG tablet Take 40 mg by mouth daily.    . furosemide (LASIX) 40 MG tablet Take 40 mg by mouth daily.    . insulin glargine (LANTUS) 100 UNIT/ML injection Inject 18 Units into the skin at bedtime.     . insulin lispro (HUMALOG) 100 UNIT/ML injection Inject 5 Units into the skin 3 (three) times daily before meals.     [name] LACTOBACILLUS PO Take 1 capsule by mouth daily. philips colon health    . loratadine (CLARITIN) 10 MG tablet Take 10 mg by mouth daily as needed.     . metoprolol tartrate (LOPRESSOR) 25 MG tablet Take 25 mg by mouth 2 (two) times daily. Hold if B/P < 100/60 or HR< 60    . Pneumococcal Vac Polyvalent (PNEUMOVAX 23 IJ) Inject as directed.    . senna (SENOKOT) 8.6 MG tablet Take 1 tablet by mouth daily as needed for constipation. Reported on 02/23/2015    . sertraline (ZOLOFT) 25 MG tablet Take 1 tablet (25 mg total) by mouth daily. 30 tablet 3  . UNABLE TO FIND Flu vaccination 12/08/2014  No current facility-administered medications for this visit.    PAST MEDICAL HISTORY: Past Medical History  Diagnosis Date  . CAD (coronary artery disease)     PCI distal RCA ...2004, residual 70% LAD   /   ...nuclear...03/2007...no ischemia.Marland KitchenMarland Kitchenpreserved LV /  nuclear...03/03/2009...inferior scar..no ischemia..EF 51%  . Dyslipidemia     takes Atorvastatin daily  . Internal hemorrhoids   . Gout     takes Allopurinol daily and Colchicine as needed  . Seasonal allergic  rhinitis   . Right carotid bruit   . Cyst of nasopharynx     per ENT Wilburn Cornelia  . Myocardial infarction [facility]) 66yrs ago  . Peripheral edema     takes Lasix daily  . HTN (hypertension)     takes Cardura,Metoprolol,[name],and Amlodipine daily  . Arthritis   . GERD (gastroesophageal reflux disease)     takes Omeprazole daily  . History of colon polyps   . Type 2 diabetes, uncontrolled, with neuropathy ([place]) 1989    takes Invokana daily and has an insulin pump (Dr. [name])  . 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 HCAP (healthcare-associated pneumonia)   . Hemiparesis affecting right side as late effect of cerebrovascular accident ([place]) 09/05/2014  . Anterior cerebral circulation hemorrhagic infarction [facility]) 11/24/2014    March, 2016, dominant left thalamic and left internal capsule ischemic infarct with resultant hemorrhagic transformation, secondary to small vessel disease. Resulted in right hemiparesis dysarthria and diplopia   //   readmission with aphasia July, 2016, this improved   . Stroke [facility])     PAST SURGICAL HISTORY: Past Surgical History  Procedure Laterality Date  . Cataract extraction Bilateral 2010  . Elbow surgery Right 1997  . Balloon angioplasty, artery  1992, 2004    CAD, Dr. Ron Parker  . Colonoscopy  04/03/2002    adenomatous polyp, int hemorrhoids  . Colonoscopy  07/18/2007    normal (Dr. Fuller Plan)  . Rotator cuff repair  09/2011    left, with subacromial decompression  . Nasal septum surgery    . Colonoscopy  09/26/2012    tubular adenoma, sm int hem, rpt 5 yrs Fuller Plan)  . Tonsillectomy and adenoidectomy    . Eye lids raised    . Cardiac catheterization  2004  . Polypectomy N/A 05/27/2013    Procedure: ENDOSCOPIC NASOPHARYNGEAL MASS;  Surgeon: [name], MD    FAMILY HISTORY: Family History  Problem Relation Age of Onset  . Alcohol abuse Father   . Coronary artery disease Neg Hx   . Stroke Neg Hx   .  Cancer Neg Hx   . Diabetes Neg Hx   . Colon cancer Neg Hx   . Heart attack Father   . Heart attack Son     SOCIAL HISTORY:  Social History   Social History  . Marital Status: Married    Spouse Name: N/A  . Number of Children: 2  . Years of Education: Bachelors   Occupational History  . [employer] pastor    Social History Main Topics  . Smoking status: Former Research scientist (life sciences)  . Smokeless tobacco: Never Used     Comment: quit smoking 45 years ago.  . Alcohol Use: No  . Drug Use: No  . Sexual Activity: Yes   Other Topics Concern  . Not on file   Social History Narrative   Caffeine: 6-7 cups coffee   Lives with wife, no pets   2 grown children, 4 grandchildren  Occu: [employer]   Edu: [employer]  [place]   Activity: not much   Diet: plenty of water, fruits and vegetables   Some care through [place]      Endo: Dr. Buddy Duty     PHYSICAL EXAM   Filed Vitals:   07/15/15 1009  BP: 151/75  Pulse: 58  Height: 5\' 8"  (1.727 m)  Weight: 164 lb (74.39 kg)    Not recorded      Body mass index is 24.94 kg/(m^2).  PHYSICAL EXAMNIATION:  Gen: NAD, conversant, well nourised, obese, well groomed                     Cardiovascular: Regular rate rhythm, no peripheral edema, warm, nontender. Eyes: Conjunctivae clear without exudates or hemorrhage Neck: Supple, no carotid bruise. Pulmonary: Clear to auscultation bilaterally   NEUROLOGICAL EXAM:  MENTAL STATUS: Speech:    Speech is normal; fluent and spontaneous with normal comprehension.  Cognition:     Orientation to time, place and person     Normal recent and remote memory     Normal Attention span and concentration     Normal Language, naming, repeating,spontaneous speech     Fund of knowledge   CRANIAL NERVES: CN II: Visual fields are full to confrontation. Fundoscopic exam is normal with sharp discs and no vascular changes. Pupils are round equal and briskly reactive to light. CN III, IV, VI: extraocular movement  are normal. He has right superior/exophoria, left inferior/exophoria  CN V: Facial sensation is intact to pinprick in all 3 divisions bilaterally. Corneal responses are intact.  CN VII: Face is symmetric with normal eye closure and smile. CN VIII: Hearing is normal to rubbing fingers CN IX, X: Palate elevates symmetrically. Phonation is normal. CN XI: Head turning and shoulder shrug are intact CN XII: Tongue is midline with normal movements and no atrophy.  MOTOR: He only has mild spasticity of right arm and leg, only mild weakness of right arm and leg, proximal and distal strength are 5-/5, full range of motion of right shoulder, elbow, wrist, right ankle   REFLEXES: Reflexes are 2+ and symmetric at the biceps, triceps, knees, and ankles. Plantar responses are flexor.  SENSORY: Intact to light touch, pinprick, positional sensation and vibratory sensation are intact in fingers and toes.  COORDINATION: He has profound dysmetria at right finger to nose, heel-to-shin  GAIT/STANCE:   DIAGNOSTIC DATA (LABS, IMAGING, TESTING) - I reviewed patient records, labs, notes, testing and imaging myself where available.   ASSESSMENT AND PLAN  MAICOL BOBICK is a 75 y.o. male   Hematologic stroke at left thalamus, posterior coronal radiata in May 31 2014 Right hemiparesis, only mild spasticity, profound dysmetria of right upper and lower extremity  There is no significant spasticity at the right upper lower extremity, full range of motion of the right upper lower extremity, less likely he will benefit Botox injection Diplopia  I have referred him to [facility] neuro-ophthalmologist Dr. Ronney Lion, M.D. Ph.D.  [facility] [address], [place], [place] [address] Ph: [phone] Fax: [phone]  CC: [name], MD

## 2015-07-15 NOTE — Telephone Encounter (Signed)
Christie left v/m wanting to know when was the latest pt could eat prior to lab appt on 07/16/15; unable to reach [place] and spoke with Peter Congo. Advised 3-4 hours prior to appt pt can eat and take med. If pt does not eat prior to appt for labs at 9:30 pt should not take insulin until eats. Peter Congo voiced understanding.

## 2015-07-16 ENCOUNTER — Other Ambulatory Visit (INDEPENDENT_AMBULATORY_CARE_PROVIDER_SITE_OTHER): Payer: PPO

## 2015-07-16 DIAGNOSIS — E785 Hyperlipidemia, unspecified: Secondary | ICD-10-CM

## 2015-07-16 DIAGNOSIS — E1165 Type 2 diabetes mellitus with hyperglycemia: Secondary | ICD-10-CM | POA: Diagnosis not present

## 2015-07-16 DIAGNOSIS — M109 Gout, unspecified: Secondary | ICD-10-CM

## 2015-07-16 DIAGNOSIS — Z125 Encounter for screening for malignant neoplasm of prostate: Secondary | ICD-10-CM | POA: Diagnosis not present

## 2015-07-16 DIAGNOSIS — I10 Essential (primary) hypertension: Secondary | ICD-10-CM | POA: Diagnosis not present

## 2015-07-16 DIAGNOSIS — D696 Thrombocytopenia, unspecified: Secondary | ICD-10-CM | POA: Diagnosis not present

## 2015-07-16 DIAGNOSIS — E1140 Type 2 diabetes mellitus with diabetic neuropathy, unspecified: Secondary | ICD-10-CM | POA: Diagnosis not present

## 2015-07-16 DIAGNOSIS — IMO0002 Reserved for concepts with insufficient information to code with codable children: Secondary | ICD-10-CM

## 2015-07-16 LAB — COMPREHENSIVE METABOLIC PANEL
ALBUMIN: 3.8 g/dL (ref 3.5–5.2)
ALT: 29 U/L (ref 0–53)
AST: 18 U/L (ref 0–37)
Alkaline Phosphatase: 101 U/L (ref 39–117)
BILIRUBIN TOTAL: 0.5 mg/dL (ref 0.2–1.2)
BUN: 18 mg/dL (ref 6–23)
CALCIUM: 9.2 mg/dL (ref 8.4–10.5)
CHLORIDE: 104 meq/L (ref 96–112)
CO2: 33 mEq/L — ABNORMAL HIGH (ref 19–32)
CREATININE: 0.84 mg/dL (ref 0.40–1.50)
GFR: 95.34 mL/min (ref >60.00)
Glucose, Bld: 153 mg/dL — ABNORMAL HIGH (ref 70–99)
Potassium: 3.9 mEq/L (ref 3.5–5.1)
Sodium: 143 mEq/L (ref 135–145)
Total Protein: 6.4 g/dL (ref 6.0–8.3)

## 2015-07-16 LAB — CBC WITH DIFFERENTIAL/PLATELET
BASOS ABS: 0 10*3/uL (ref 0.0–0.1)
BASOS PCT: 0.5 % (ref 0.0–3.0)
EOS ABS: 0.1 10*3/uL (ref 0.0–0.7)
EOS PCT: 0.9 % (ref 0.0–5.0)
HCT: 43.1 % (ref 39.0–52.0)
Hemoglobin: 14.6 g/dL (ref 13.0–17.0)
Lymphocytes Relative: 22.5 % (ref 12.0–46.0)
Lymphs Abs: 1.7 10*3/uL (ref 0.7–4.0)
MCHC: 33.9 g/dL (ref 30.0–36.0)
MCV: 89 fl (ref 78.0–100.0)
MONOS PCT: 5.9 % (ref 3.0–12.0)
Monocytes Absolute: 0.4 10*3/uL (ref 0.1–1.0)
NEUTROS PCT: 70.2 % (ref 43.0–77.0)
Neutro Abs: 5.3 10*3/uL (ref 1.4–7.7)
PLATELETS: 201 10*3/uL (ref 150.0–400.0)
RBC: 4.84 Mil/uL (ref 4.22–5.81)
RDW: 13 % (ref 11.5–15.5)
WBC: 7.5 10*3/uL (ref 4.0–10.5)

## 2015-07-16 LAB — TSH: TSH: 0.91 u[IU]/mL (ref 0.35–4.50)

## 2015-07-16 LAB — LIPID PANEL
CHOLESTEROL: 125 mg/dL (ref 0–200)
HDL: 26.1 mg/dL — ABNORMAL LOW (ref >39.00)
LDL Cholesterol: 70 mg/dL (ref 0–99)
NONHDL: 98.67
Total CHOL/HDL Ratio: 5
Triglycerides: 145 mg/dL (ref 0.0–149.0)
VLDL: 29 mg/dL (ref 0.0–40.0)

## 2015-07-16 LAB — PSA, MEDICARE: PSA: 2.98 ng/mL (ref 0.10–4.00)

## 2015-07-16 LAB — MICROALBUMIN / CREATININE URINE RATIO
CREATININE, U: 138.3 mg/dL
Microalb Creat Ratio: 0.7 mg/g (ref 0.0–30.0)
Microalb, Ur: 0.9 mg/dL (ref 0.0–1.9)

## 2015-07-16 LAB — HEMOGLOBIN A1C: HEMOGLOBIN A1C: 6.8 % — AB (ref 4.6–6.5)

## 2015-07-16 LAB — URIC ACID: Uric Acid, Serum: 7 mg/dL (ref 4.0–7.8)

## 2015-07-23 ENCOUNTER — Encounter: Payer: Self-pay | Admitting: Family Medicine

## 2015-07-23 ENCOUNTER — Ambulatory Visit (INDEPENDENT_AMBULATORY_CARE_PROVIDER_SITE_OTHER): Payer: PPO | Admitting: Family Medicine

## 2015-07-23 VITALS — BP 136/78 | HR 64 | Temp 98.3°F | Wt 164.8 lb

## 2015-07-23 DIAGNOSIS — I6389 Other cerebral infarction: Secondary | ICD-10-CM

## 2015-07-23 DIAGNOSIS — F0631 Mood disorder due to known physiological condition with depressive features: Secondary | ICD-10-CM

## 2015-07-23 DIAGNOSIS — E1140 Type 2 diabetes mellitus with diabetic neuropathy, unspecified: Secondary | ICD-10-CM

## 2015-07-23 DIAGNOSIS — I638 Other cerebral infarction: Secondary | ICD-10-CM

## 2015-07-23 DIAGNOSIS — E1165 Type 2 diabetes mellitus with hyperglycemia: Secondary | ICD-10-CM

## 2015-07-23 DIAGNOSIS — Z7189 Other specified counseling: Secondary | ICD-10-CM | POA: Insufficient documentation

## 2015-07-23 DIAGNOSIS — H532 Diplopia: Secondary | ICD-10-CM | POA: Diagnosis not present

## 2015-07-23 DIAGNOSIS — I10 Essential (primary) hypertension: Secondary | ICD-10-CM

## 2015-07-23 DIAGNOSIS — M109 Gout, unspecified: Secondary | ICD-10-CM

## 2015-07-23 DIAGNOSIS — I2510 Atherosclerotic heart disease of native coronary artery without angina pectoris: Secondary | ICD-10-CM

## 2015-07-23 DIAGNOSIS — I69351 Hemiplegia and hemiparesis following cerebral infarction affecting right dominant side: Secondary | ICD-10-CM

## 2015-07-23 DIAGNOSIS — E785 Hyperlipidemia, unspecified: Secondary | ICD-10-CM

## 2015-07-23 DIAGNOSIS — Z0000 Encounter for general adult medical examination without abnormal findings: Secondary | ICD-10-CM | POA: Diagnosis not present

## 2015-07-23 DIAGNOSIS — I639 Cerebral infarction, unspecified: Secondary | ICD-10-CM | POA: Diagnosis not present

## 2015-07-23 DIAGNOSIS — IMO0002 Reserved for concepts with insufficient information to code with codable children: Secondary | ICD-10-CM

## 2015-07-23 DIAGNOSIS — I2583 Coronary atherosclerosis due to lipid rich plaque: Secondary | ICD-10-CM

## 2015-07-23 DIAGNOSIS — D696 Thrombocytopenia, unspecified: Secondary | ICD-10-CM

## 2015-07-23 NOTE — Assessment & Plan Note (Signed)
Chronic, stable. Continue atorvastatin 80mg  daily.

## 2015-07-23 NOTE — Assessment & Plan Note (Signed)
Appreciate cards care of patient.  

## 2015-07-23 NOTE — Assessment & Plan Note (Signed)
Has been referred to [facility] neuro-ophthalmologist for further evaluation.

## 2015-07-23 NOTE — Assessment & Plan Note (Signed)
Chronic, stable. Continue current regimen. 

## 2015-07-23 NOTE — Assessment & Plan Note (Addendum)
Appreciate neuro care of patient. Discussed reasonable to pursue driving evaluation through [facility] but will want neuro and PT/OT input prior to pt restarting driving - anticipate difficulty due to ongoing R sided hemiplegia (UE and LE) and diplopia. He is able to walk short distances with walker.

## 2015-07-23 NOTE — Assessment & Plan Note (Signed)

## 2015-07-23 NOTE — Progress Notes (Signed)
Pre visit review using our clinic review tool, if applicable. No additional management support is needed unless otherwise documented below in the visit note. 

## 2015-07-23 NOTE — Assessment & Plan Note (Signed)
Appreciate neurology assistance. Has restarted baclofen. Completed outpatient rehab. Has HEP but not regular with this.  Discussed break from PT over summer, regular HEP, then reassess need for another PT course Fall 2017. Pt and daughter agree.

## 2015-07-23 NOTE — Assessment & Plan Note (Signed)
Continue insulin pump, per endo Dr Buddy Duty

## 2015-07-23 NOTE — Assessment & Plan Note (Signed)
Advanced directive discussion - at home. HCPOA would be wife then 2 children. Will bring me copy

## 2015-07-23 NOTE — Assessment & Plan Note (Signed)
Continue low dose sertraline.

## 2015-07-23 NOTE — Assessment & Plan Note (Signed)
Improved

## 2015-07-23 NOTE — Assessment & Plan Note (Signed)
No recent flares 

## 2015-07-23 NOTE — Progress Notes (Signed)
BP 136/78 mmHg  Pulse 64  Temp(Src) 98.3 F (36.8 C) (Oral)  Wt 164 lb 12 oz (74.73 kg)   CC: medicare wellness visit  Subjective:    Patient ID: Jeffrey [name], male    DOB: Jun 16, 1940, 75 y.o.   MRN: [id number]  HPI: Jeffrey [name] is a 75 y.o. male presenting on 07/23/2015 for Annual Exam   Presents with daughter today. Finished outpatient therapy 05/2015 - pt and family think this helped significantly. Interested in ongoing therapy. He does have HEP but finds more trouble finihsing this. Discussed restarting PT this fall if needed.   Recent eval by neurology - note reviewed. Hemorrhagic L thalamus stroke 05/2014 with residual R hemiparesis and profound dysmetria of RUE and RLE. Referred to [facility] neuro-ophth for diplopia post stroke.   His main goal is to be able to restart driving to regain some independence.   Hearing screen - hearing aides Vision screen - with eye doctor Fall risk screen - passed Depression screen - passed  Preventative: COLONOSCOPY Date: 09/26/2012 tubular adenoma, sm int hem, rpt 5 yrs Fuller Plan) Prostate cancer screening - no fmhx prostate cancer. None recent, no problems in the past. 1+ nocturia fills 2 urinals. Declines screen today.  Lung cancer screening - not candidate - remote smoker Flu shot - yearly Prevnar 2015, pneumovax 2014 Shingles shot - 2013 Advanced directive discussion - at home. HCPOA would be wife then 2 children. Will bring me copy Seat belt use discussed Sunscreen use and skin screen discussed, no changing moles on skin.   Caffeine: 6-7 cups coffee Lives with wife, no pets 2 grown children, 4 grandchildren Occu: Artist Edu: [place] Activity: not much Diet: plenty of water, fruits and vegetables Some care through [place]  Relevant past medical, surgical, family and social history reviewed and updated as indicated. Interim medical history since our last visit reviewed. Allergies and medications reviewed and  updated. Current Outpatient Prescriptions on File Prior to Visit  Medication Sig  . atorvastatin (LIPITOR) 80 MG tablet Take 80 mg by mouth at bedtime.  . baclofen (LIORESAL) 10 MG tablet Take 0.5 tablets (5 mg total) by mouth 3 (three) times daily.  . Cholecalciferol (VITAMIN D3) 5000 UNITS TABS Take 5,000 Units by mouth once a week.  . clopidogrel (PLAVIX) 75 MG tablet Take 1 tablet (75 mg total) by mouth daily.  . fosinopril ([name]) 40 MG tablet Take 40 mg by mouth daily.  . furosemide (LASIX) 40 MG tablet Take 40 mg by mouth daily.  . insulin glargine (LANTUS) 100 UNIT/ML injection Inject 18 Units into the skin at bedtime.   . insulin lispro (HUMALOG) 100 UNIT/ML injection Inject 5 Units into the skin 3 (three) times daily before meals.   [name] LACTOBACILLUS PO Take 1 capsule by mouth daily. philips colon health  . loratadine (CLARITIN) 10 MG tablet Take 10 mg by mouth daily as needed.   . metoprolol tartrate (LOPRESSOR) 25 MG tablet Take 25 mg by mouth 2 (two) times daily. Hold if B/P < 100/60 or HR< 60  . Pneumococcal Vac Polyvalent (PNEUMOVAX 23 IJ) Inject as directed.  . senna (SENOKOT) 8.6 MG tablet Take 1 tablet by mouth daily as needed for constipation. Reported on 02/23/2015  . sertraline (ZOLOFT) 25 MG tablet Take 1 tablet (25 mg total) by mouth daily.  [name] UNABLE TO FIND Flu vaccination 12/08/2014   No current facility-administered medications on file prior to visit.    Review of Systems Per  HPI unless specifically indicated in ROS section     Objective:    BP 136/78 mmHg  Pulse 64  Temp(Src) 98.3 F (36.8 C) (Oral)  Wt 164 lb 12 oz (74.73 kg)  Wt Readings from Last 3 Encounters:  07/23/15 164 lb 12 oz (74.73 kg)  07/15/15 164 lb (74.39 kg)  06/29/15 164 lb (74.39 kg)    Physical Exam  Constitutional: He is oriented to person, place, and time. He appears well-developed and well-nourished. No distress.  HENT:  Head: Normocephalic and atraumatic.  Right Ear: Hearing,  tympanic membrane, external ear and ear canal normal.  Left Ear: Hearing, tympanic membrane, external ear and ear canal normal.  Nose: Nose normal.  Mouth/Throat: Uvula is midline, oropharynx is clear and moist and mucous membranes are normal. No oropharyngeal exudate, posterior oropharyngeal edema or posterior oropharyngeal erythema.  Eyes: Conjunctivae and EOM are normal. Pupils are equal, round, and reactive to light. No scleral icterus.  Diplopia - currently with prism eyewear  Neck: Normal range of motion. Neck supple. No thyromegaly present.  Cardiovascular: Normal rate, regular rhythm, normal heart sounds and intact distal pulses.   No murmur heard. Pulses:      Radial pulses are 2+ on the right side, and 2+ on the left side.  Pulmonary/Chest: Effort normal and breath sounds normal. No respiratory distress. He has no wheezes. He has no rales.  Musculoskeletal: Normal range of motion. He exhibits no edema.  Lymphadenopathy:    He has no cervical adenopathy.  Neurological: He is alert and oriented to person, place, and time.  In wheelchair Good strength but marked dysmetria RUE and RLE Recall 3/3 Calculation 4/5 serial 7s  Skin: Skin is warm and dry. No rash noted.  Psychiatric: He has a normal mood and affect. His behavior is normal. Judgment and thought content normal.  Nursing note and vitals reviewed.  Results for orders placed or performed in visit on 07/23/15  HM DIABETES EYE EXAM  Result Value Ref Range   HM Diabetic Eye Exam Retinopathy (A) No Retinopathy      Assessment & Plan:   Problem List Items Addressed This Visit    Essential hypertension    Chronic, stable. Continue current regimen.      Hyperlipidemia    Chronic, stable. Continue atorvastatin 80mg  daily.      Type 2 diabetes, uncontrolled, with neuropathy (HCC)    Continue insulin pump, per endo Dr Buddy Duty      CAD (coronary artery disease)    Appreciate cards care of patient.      Gout    No  recent flares      Thrombocytopenia (HCC)    Improved.      Hemiparesis affecting right side as late effect of cerebrovascular accident [facility])    [place] neurology assistance. Has restarted baclofen. Completed outpatient rehab. Has HEP but not regular with this.  Discussed break from PT over summer, regular HEP, then reassess need for another PT course Fall 2017. Pt and daughter agree.       Depression due to stroke ([place])    Continue low dose sertraline.       Anterior cerebral circulation hemorrhagic infarction [facility])    Appreciate neuro care of patient. Discussed reasonable to pursue driving evaluation through [facility] but will want neuro and PT/OT input prior to pt restarting driving - anticipate difficulty due to ongoing R sided hemiplegia (UE and LE) and diplopia. He is able to walk short distances with walker.  Diplopia    Has been referred to [facility] neuro-ophthalmologist for further evaluation.       Medicare annual wellness visit, initial - Primary    I have personally reviewed the Medicare Annual Wellness questionnaire and have noted 1. The patient's medical and social history 2. Their use of alcohol, tobacco or illicit drugs 3. Their current medications and supplements 4. The patient's functional ability including ADL's, fall risks, home safety risks and hearing or visual impairment. Cognitive function has been assessed and addressed as indicated.  5. Diet and physical activity 6. Evidence for depression or mood disorders The patients weight, height, BMI have been recorded in the chart. I have made referrals, counseling and provided education to the patient based on review of the above and I have provided the pt with a written personalized care plan for preventive services. Provider list updated.. See scanned questionairre as needed for further documentation. Reviewed preventative protocols and updated unless pt declined.       Advanced care planning/counseling  discussion    Advanced directive discussion - at home. HCPOA would be wife then 2 children. Will bring me copy          Follow up plan: Return in about 6 months (around 01/23/2016), or as needed, for follow up visit.  [name], MD

## 2015-07-23 NOTE — Patient Instructions (Addendum)
Bring me copy of your advanced directives to update your chart. Return in 6 months for follow up  Good to see you today, call us with questions May start driving evaluation with local DMV but we will also need input from neurology and PT/OT prior to driving.    Health Maintenance, Male A healthy lifestyle and preventative care can promote health and wellness.  Maintain regular health, dental, and eye exams.  Eat a healthy diet. Foods like vegetables, fruits, whole grains, low-fat dairy products, and lean protein foods contain the nutrients you need and are low in calories. Decrease your intake of foods high in solid fats, added sugars, and salt. Get information about a proper diet from your health care provider, if necessary.  Regular physical exercise is one of the most important things you can do for your health. Most adults should get at least 150 minutes of moderate-intensity exercise (any activity that increases your heart rate and causes you to sweat) each week. In addition, most adults need muscle-strengthening exercises on 2 or more days a week.   Maintain a healthy weight. The body mass index (BMI) is a screening tool to identify possible weight problems. It provides an estimate of body fat based on height and weight. Your health care provider can find your BMI and can help you achieve or maintain a healthy weight. For males 20 years and older:  A BMI below 18.5 is considered underweight.  A BMI of 18.5 to 24.9 is normal.  A BMI of 25 to 29.9 is considered overweight.  A BMI of 30 and above is considered obese.  Maintain normal blood lipids and cholesterol by exercising and minimizing your intake of saturated fat. Eat a balanced diet with plenty of fruits and vegetables. Blood tests for lipids and cholesterol should begin at age 26 and be repeated every 5 years. If your lipid or cholesterol levels are high, you are over age 36, or you are at high risk for heart disease, you may  need your cholesterol levels checked more frequently.Ongoing high lipid and cholesterol levels should be treated with medicines if diet and exercise are not working.  If you smoke, find out from your health care provider how to quit. If you do not use tobacco, do not start.  Lung cancer screening is recommended for adults aged 110-80 years who are at high risk for developing lung cancer because of a history of smoking. A yearly low-dose CT scan of the lungs is recommended for people who have at least a 30-pack-year history of smoking and are current smokers or have quit within the past 15 years. A pack year of smoking is smoking an average of 1 pack of cigarettes a day for 1 year (for example, a 30-pack-year history of smoking could mean smoking 1 pack a day for 30 years or 2 packs a day for 15 years). Yearly screening should continue until the smoker has stopped smoking for at least 15 years. Yearly screening should be stopped for people who develop a health problem that would prevent them from having lung cancer treatment.  If you choose to drink alcohol, do not have more than 2 drinks per day. One drink is considered to be 12 oz (360 mL) of beer, 5 oz (150 mL) of wine, or 1.5 oz (45 mL) of liquor.  Avoid the use of street drugs. Do not share needles with anyone. Ask for help if you need support or instructions about stopping the use of drugs.  High blood pressure causes heart disease and increases the risk of stroke. High blood pressure is more likely to develop in:  People who have blood pressure in the end of the normal range (100-139/85-89 mm Hg).  People who are overweight or obese.  People who are African American.  If you are 58-2 years of age, have your blood pressure checked every 3-5 years. If you are 33 years of age or older, have your blood pressure checked every year. You should have your blood pressure measured twice--once when you are at a hospital or clinic, and once when you are  not at a hospital or clinic. Record the average of the two measurements. To check your blood pressure when you are not at a hospital or clinic, you can use:  An automated blood pressure machine at a pharmacy.  A home blood pressure monitor.  If you are 35-60 years old, ask your health care provider if you should take aspirin to prevent heart disease.  Diabetes screening involves taking a blood sample to check your fasting blood sugar level. This should be done once every 3 years after age 64 if you are at a normal weight and without risk factors for diabetes. Testing should be considered at a younger age or be carried out more frequently if you are overweight and have at least 1 risk factor for diabetes.  Colorectal cancer can be detected and often prevented. Most routine colorectal cancer screening begins at the age of 22 and continues through age 77. However, your health care provider may recommend screening at an earlier age if you have risk factors for colon cancer. On a yearly basis, your health care provider may provide home test kits to check for hidden blood in the stool. A small camera at the end of a tube may be used to directly examine the colon (sigmoidoscopy or colonoscopy) to detect the earliest forms of colorectal cancer. Talk to your health care provider about this at age 69 when routine screening begins. A direct exam of the colon should be repeated every 5-10 years through age 67, unless early forms of precancerous polyps or small growths are found.  People who are at an increased risk for hepatitis B should be screened for this virus. You are considered at high risk for hepatitis B if:  You were born in a country where hepatitis B occurs often. Talk with your health care provider about which countries are considered high risk.  Your parents were born in a high-risk country and you have not received a shot to protect against hepatitis B (hepatitis B vaccine).  You have HIV or  AIDS.  You use needles to inject street drugs.  You live with, or have sex with, someone who has hepatitis B.  You are a man who has sex with other men (MSM).  You get hemodialysis treatment.  You take certain medicines for conditions like cancer, organ transplantation, and autoimmune conditions.  Hepatitis C blood testing is recommended for all people born from [birth year] through 1963 and any individual with known risk factors for hepatitis C.  Healthy men should no longer receive prostate-specific antigen (PSA) blood tests as part of routine cancer screening. Talk to your health care provider about prostate cancer screening.  Testicular cancer screening is not recommended for adolescents or adult males who have no symptoms. Screening includes self-exam, a health care provider exam, and other screening tests. Consult with your health care provider about any symptoms you have or any  concerns you have about testicular cancer.  Practice safe sex. Use condoms and avoid high-risk sexual practices to reduce the spread of sexually transmitted infections (STIs).  You should be screened for STIs, including gonorrhea and chlamydia if:  You are sexually active and are younger than 24 years.  You are older than 24 years, and your health care provider tells you that you are at risk for this type of infection.  Your sexual activity has changed since you were last screened, and you are at an increased risk for chlamydia or gonorrhea. Ask your health care provider if you are at risk.  If you are at risk of being infected with HIV, it is recommended that you take a prescription medicine daily to prevent HIV infection. This is called pre-exposure prophylaxis (PrEP). You are considered at risk if:  You are a man who has sex with other men (MSM).  You are a heterosexual man who is sexually active with multiple partners.  You take drugs by injection.  You are sexually active with a partner who has  HIV.  Talk with your health care provider about whether you are at high risk of being infected with HIV. If you choose to begin PrEP, you should first be tested for HIV. You should then be tested every 3 months for as long as you are taking PrEP.  Use sunscreen. Apply sunscreen liberally and repeatedly throughout the day. You should seek shade when your shadow is shorter than you. Protect yourself by wearing long sleeves, pants, a wide-brimmed hat, and sunglasses year round whenever you are outdoors.  Tell your health care provider of new moles or changes in moles, especially if there is a change in shape or color. Also, tell your health care provider if a mole is larger than the size of a pencil eraser.  A one-time screening for abdominal aortic aneurysm (AAA) and surgical repair of large AAAs by ultrasound is recommended for men aged 15-75 years who are current or former smokers.  Stay current with your vaccines (immunizations).   This information is not intended to replace advice given to you by your health care provider. Make sure you discuss any questions you have with your health care provider.   Document Released: 08/20/2007 Document Revised: 03/14/2014 Document Reviewed: 07/19/2010 Elsevier Interactive Patient Education [employer].

## 2015-07-23 NOTE — Assessment & Plan Note (Deleted)
Appreciate neurology assistance. Has restarted baclofen.

## 2015-07-28 ENCOUNTER — Encounter: Payer: Self-pay | Admitting: Podiatry

## 2015-07-28 ENCOUNTER — Ambulatory Visit (INDEPENDENT_AMBULATORY_CARE_PROVIDER_SITE_OTHER): Payer: PPO | Admitting: Podiatry

## 2015-07-28 DIAGNOSIS — B351 Tinea unguium: Secondary | ICD-10-CM

## 2015-07-28 DIAGNOSIS — M79675 Pain in left toe(s): Secondary | ICD-10-CM | POA: Diagnosis not present

## 2015-07-28 DIAGNOSIS — M79674 Pain in right toe(s): Secondary | ICD-10-CM | POA: Diagnosis not present

## 2015-07-28 NOTE — Progress Notes (Signed)
Patient ID: Jeffrey [name], male   DOB: 07-21-1940, 75 y.o.   MRN: [id number]   Subjective: This patient presents again today complaining of ongoing discomfort in his toenails and walking wearing shoes and is requesting nail debridement   Objective: Patient's daughter and wife present and treatment room No Open skin lesions bilaterally The toenails are elongated, hypertrophic, deformed, discolored and tender direct palpation 6-10   Assessment: Type II diabetic with a history of neuropathy Symptomatic onychomycoses 6-10 History of CVA  Plan: Debridement of toenails 10 mechanically and electrically without any bleeding  Reappoint 10 weeks

## 2015-07-28 NOTE — Patient Instructions (Signed)
Diabetes and Foot Care Diabetes may cause you to have problems because of poor blood supply (circulation) to your feet and legs. This may cause the skin on your feet to become thinner, break easier, and heal more slowly. Your skin may become dry, and the skin may peel and crack. You may also have nerve damage in your legs and feet causing decreased feeling in them. You may not notice minor injuries to your feet that could lead to infections or more serious problems. Taking care of your feet is one of the most important things you can do for yourself.  HOME CARE INSTRUCTIONS  Wear shoes at all times, even in the house. Do not go barefoot. Bare feet are easily injured.  Check your feet daily for blisters, cuts, and redness. If you cannot see the bottom of your feet, use a mirror or ask someone for help.  Wash your feet with warm water (do not use hot water) and mild soap. Then pat your feet and the areas between your toes until they are completely dry. Do not soak your feet as this can dry your skin.  Apply a moisturizing lotion or petroleum jelly (that does not contain alcohol and is unscented) to the skin on your feet and to dry, brittle toenails. Do not apply lotion between your toes.  Trim your toenails straight across. Do not dig under them or around the cuticle. File the edges of your nails with an emery board or nail file.  Do not cut corns or calluses or try to remove them with medicine.  Wear clean socks or stockings every day. Make sure they are not too tight. Do not wear knee-high stockings since they may decrease blood flow to your legs.  Wear shoes that fit properly and have enough cushioning. To break in new shoes, wear them for just a few hours a day. This prevents you from injuring your feet. Always look in your shoes before you put them on to be sure there are no objects inside.  Do not cross your legs. This may decrease the blood flow to your feet.  If you find a minor scrape,  cut, or break in the skin on your feet, keep it and the skin around it clean and dry. These areas may be cleansed with mild soap and water. Do not cleanse the area with peroxide, alcohol, or iodine.  When you remove an adhesive bandage, be sure not to damage the skin around it.  If you have a wound, look at it several times a day to make sure it is healing.  Do not use heating pads or hot water bottles. They may burn your skin. If you have lost feeling in your feet or legs, you may not know it is happening until it is too late.  Make sure your health care provider performs a complete foot exam at least annually or more often if you have foot problems. Report any cuts, sores, or bruises to your health care provider immediately. SEEK MEDICAL CARE IF:   You have an injury that is not healing.  You have cuts or breaks in the skin.  You have an ingrown nail.  You notice redness on your legs or feet.  You feel burning or tingling in your legs or feet.  You have pain or cramps in your legs and feet.  Your legs or feet are numb.  Your feet always feel cold. SEEK IMMEDIATE MEDICAL CARE IF:   There is increasing redness,   swelling, or pain in or around a wound.  There is a red line that goes up your leg.  Pus is coming from a wound.  You develop a fever or as directed by your health care provider.  You notice a bad smell coming from an ulcer or wound.   This information is not intended to replace advice given to you by your health care provider. Make sure you discuss any questions you have with your health care provider.   Document Released: 02/19/2000 Document Revised: 10/24/2012 Document Reviewed: 07/31/2012 Elsevier Interactive Patient Education 2016 Elsevier Inc.  

## 2015-07-29 DIAGNOSIS — R279 Unspecified lack of coordination: Secondary | ICD-10-CM | POA: Diagnosis not present

## 2015-07-29 DIAGNOSIS — M6281 Muscle weakness (generalized): Secondary | ICD-10-CM | POA: Diagnosis not present

## 2015-07-29 DIAGNOSIS — I69322 Dysarthria following cerebral infarction: Secondary | ICD-10-CM | POA: Diagnosis not present

## 2015-07-29 DIAGNOSIS — I639 Cerebral infarction, unspecified: Secondary | ICD-10-CM | POA: Diagnosis not present

## 2015-07-29 DIAGNOSIS — R278 Other lack of coordination: Secondary | ICD-10-CM | POA: Diagnosis not present

## 2015-07-29 DIAGNOSIS — R261 Paralytic gait: Secondary | ICD-10-CM | POA: Diagnosis not present

## 2015-07-29 DIAGNOSIS — R600 Localized edema: Secondary | ICD-10-CM | POA: Diagnosis not present

## 2015-08-04 ENCOUNTER — Other Ambulatory Visit: Payer: Self-pay

## 2015-08-04 NOTE — Telephone Encounter (Signed)
Christie (POA of pt) left v/m pt usually gets furosemide at [place] and pt is out of med and request Dr Darnell Level to refill to [facility]. Please advise. Christie request cb.

## 2015-08-04 NOTE — Telephone Encounter (Signed)
[facility] left v/m requesting refill furosemide; do not see where Dr Darnell Level has prescribed on current or hx med list.Please advise. Pt last annual 07/23/15.

## 2015-08-05 MED ORDER — FUROSEMIDE 40 MG PO TABS: 40.0000 mg | ORAL_TABLET | Freq: Every day | ORAL | Status: DC

## 2015-08-05 NOTE — Telephone Encounter (Signed)
plz notify sent in. 

## 2015-08-05 NOTE — Telephone Encounter (Signed)
Message left notifying patient's daughter.  

## 2015-08-17 DIAGNOSIS — Z961 Presence of intraocular lens: Secondary | ICD-10-CM | POA: Diagnosis not present

## 2015-08-17 DIAGNOSIS — H532 Diplopia: Secondary | ICD-10-CM | POA: Diagnosis not present

## 2015-08-17 DIAGNOSIS — H52203 Unspecified astigmatism, bilateral: Secondary | ICD-10-CM | POA: Diagnosis not present

## 2015-08-17 DIAGNOSIS — E113293 Type 2 diabetes mellitus with mild nonproliferative diabetic retinopathy without macular edema, bilateral: Secondary | ICD-10-CM | POA: Diagnosis not present

## 2015-08-18 DIAGNOSIS — B078 Other viral warts: Secondary | ICD-10-CM | POA: Diagnosis not present

## 2015-08-18 DIAGNOSIS — L820 Inflamed seborrheic keratosis: Secondary | ICD-10-CM | POA: Diagnosis not present

## 2015-08-18 DIAGNOSIS — D225 Melanocytic nevi of trunk: Secondary | ICD-10-CM | POA: Diagnosis not present

## 2015-08-29 DIAGNOSIS — R600 Localized edema: Secondary | ICD-10-CM | POA: Diagnosis not present

## 2015-08-29 DIAGNOSIS — M6281 Muscle weakness (generalized): Secondary | ICD-10-CM | POA: Diagnosis not present

## 2015-08-29 DIAGNOSIS — R278 Other lack of coordination: Secondary | ICD-10-CM | POA: Diagnosis not present

## 2015-08-29 DIAGNOSIS — I69322 Dysarthria following cerebral infarction: Secondary | ICD-10-CM | POA: Diagnosis not present

## 2015-08-29 DIAGNOSIS — I639 Cerebral infarction, unspecified: Secondary | ICD-10-CM | POA: Diagnosis not present

## 2015-08-29 DIAGNOSIS — R261 Paralytic gait: Secondary | ICD-10-CM | POA: Diagnosis not present

## 2015-08-29 DIAGNOSIS — R279 Unspecified lack of coordination: Secondary | ICD-10-CM | POA: Diagnosis not present

## 2015-09-28 DIAGNOSIS — R261 Paralytic gait: Secondary | ICD-10-CM | POA: Diagnosis not present

## 2015-09-28 DIAGNOSIS — R600 Localized edema: Secondary | ICD-10-CM | POA: Diagnosis not present

## 2015-09-28 DIAGNOSIS — I69322 Dysarthria following cerebral infarction: Secondary | ICD-10-CM | POA: Diagnosis not present

## 2015-09-28 DIAGNOSIS — R279 Unspecified lack of coordination: Secondary | ICD-10-CM | POA: Diagnosis not present

## 2015-09-28 DIAGNOSIS — M6281 Muscle weakness (generalized): Secondary | ICD-10-CM | POA: Diagnosis not present

## 2015-09-28 DIAGNOSIS — I639 Cerebral infarction, unspecified: Secondary | ICD-10-CM | POA: Diagnosis not present

## 2015-09-28 DIAGNOSIS — R278 Other lack of coordination: Secondary | ICD-10-CM | POA: Diagnosis not present

## 2015-09-30 ENCOUNTER — Other Ambulatory Visit: Payer: Self-pay | Admitting: Family Medicine

## 2015-10-05 ENCOUNTER — Telehealth: Payer: Self-pay | Admitting: Family Medicine

## 2015-10-05 MED ORDER — SERTRALINE HCL 25 MG PO TABS: 25.0000 mg | ORAL_TABLET | Freq: Every day | ORAL | 11 refills | Status: DC

## 2015-10-05 MED ORDER — METOPROLOL TARTRATE 25 MG PO TABS: 25.0000 mg | ORAL_TABLET | Freq: Two times a day (BID) | ORAL | 11 refills | Status: DC

## 2015-10-05 NOTE — Telephone Encounter (Signed)
Pt is going to be out of his blood pressure med Metoprolol 25mg  and his zoloft tomorrow.  Please call these refills in for the patient to [facility] [place]

## 2015-10-05 NOTE — Telephone Encounter (Signed)
Sent in

## 2015-10-06 ENCOUNTER — Ambulatory Visit: Payer: PPO | Admitting: Podiatry

## 2015-10-07 DIAGNOSIS — E113299 Type 2 diabetes mellitus with mild nonproliferative diabetic retinopathy without macular edema, unspecified eye: Secondary | ICD-10-CM | POA: Diagnosis not present

## 2015-10-07 DIAGNOSIS — Z794 Long term (current) use of insulin: Secondary | ICD-10-CM | POA: Diagnosis not present

## 2015-10-07 DIAGNOSIS — E785 Hyperlipidemia, unspecified: Secondary | ICD-10-CM | POA: Diagnosis not present

## 2015-10-07 DIAGNOSIS — E1142 Type 2 diabetes mellitus with diabetic polyneuropathy: Secondary | ICD-10-CM | POA: Diagnosis not present

## 2015-10-07 DIAGNOSIS — I10 Essential (primary) hypertension: Secondary | ICD-10-CM | POA: Diagnosis not present

## 2015-10-21 ENCOUNTER — Encounter: Payer: Self-pay | Admitting: Podiatry

## 2015-10-21 ENCOUNTER — Ambulatory Visit (INDEPENDENT_AMBULATORY_CARE_PROVIDER_SITE_OTHER): Payer: PPO | Admitting: Podiatry

## 2015-10-21 DIAGNOSIS — B351 Tinea unguium: Secondary | ICD-10-CM

## 2015-10-21 DIAGNOSIS — M79674 Pain in right toe(s): Secondary | ICD-10-CM | POA: Diagnosis not present

## 2015-10-21 DIAGNOSIS — M79675 Pain in left toe(s): Secondary | ICD-10-CM | POA: Diagnosis not present

## 2015-10-21 NOTE — Patient Instructions (Signed)
Diabetes and Foot Care Diabetes may cause you to have problems because of poor blood supply (circulation) to your feet and legs. This may cause the skin on your feet to become thinner, break easier, and heal more slowly. Your skin may become dry, and the skin may peel and crack. You may also have nerve damage in your legs and feet causing decreased feeling in them. You may not notice minor injuries to your feet that could lead to infections or more serious problems. Taking care of your feet is one of the most important things you can do for yourself.  HOME CARE INSTRUCTIONS  Wear shoes at all times, even in the house. Do not go barefoot. Bare feet are easily injured.  Check your feet daily for blisters, cuts, and redness. If you cannot see the bottom of your feet, use a mirror or ask someone for help.  Wash your feet with warm water (do not use hot water) and mild soap. Then pat your feet and the areas between your toes until they are completely dry. Do not soak your feet as this can dry your skin.  Apply a moisturizing lotion or petroleum jelly (that does not contain alcohol and is unscented) to the skin on your feet and to dry, brittle toenails. Do not apply lotion between your toes.  Trim your toenails straight across. Do not dig under them or around the cuticle. File the edges of your nails with an emery board or nail file.  Do not cut corns or calluses or try to remove them with medicine.  Wear clean socks or stockings every day. Make sure they are not too tight. Do not wear knee-high stockings since they may decrease blood flow to your legs.  Wear shoes that fit properly and have enough cushioning. To break in new shoes, wear them for just a few hours a day. This prevents you from injuring your feet. Always look in your shoes before you put them on to be sure there are no objects inside.  Do not cross your legs. This may decrease the blood flow to your feet.  If you find a minor scrape,  cut, or break in the skin on your feet, keep it and the skin around it clean and dry. These areas may be cleansed with mild soap and water. Do not cleanse the area with peroxide, alcohol, or iodine.  When you remove an adhesive bandage, be sure not to damage the skin around it.  If you have a wound, look at it several times a day to make sure it is healing.  Do not use heating pads or hot water bottles. They may burn your skin. If you have lost feeling in your feet or legs, you may not know it is happening until it is too late.  Make sure your health care provider performs a complete foot exam at least annually or more often if you have foot problems. Report any cuts, sores, or bruises to your health care provider immediately. SEEK MEDICAL CARE IF:   You have an injury that is not healing.  You have cuts or breaks in the skin.  You have an ingrown nail.  You notice redness on your legs or feet.  You feel burning or tingling in your legs or feet.  You have pain or cramps in your legs and feet.  Your legs or feet are numb.  Your feet always feel cold. SEEK IMMEDIATE MEDICAL CARE IF:   There is increasing redness,   swelling, or pain in or around a wound.  There is a red line that goes up your leg.  Pus is coming from a wound.  You develop a fever or as directed by your health care provider.  You notice a bad smell coming from an ulcer or wound.   This information is not intended to replace advice given to you by your health care provider. Make sure you discuss any questions you have with your health care provider.   Document Released: 02/19/2000 Document Revised: 10/24/2012 Document Reviewed: 07/31/2012 Elsevier Interactive Patient Education 2016 Elsevier Inc.  

## 2015-10-22 NOTE — Progress Notes (Signed)
Patient ID: Jeffrey [name], male   DOB: 13-Jul-1940, 75 y.o.   MRN: [id number]     Subjective: This patient presents again today complaining of ongoing discomfort in his toenails and walking wearing shoes and is requesting nail debridement The patient's wife is present in the treatment room today  Objective: No Open skin lesions bilaterally The toenails are elongated, hypertrophic, deformed, discolored and tender direct palpation 6-10   Assessment: Type II diabetic with a history of neuropathy Symptomatic onychomycoses 6-10 History of CVA  Plan: Debridement of toenails 10 mechanically and electrically without any bleeding  Reappoint 10 weeks

## 2015-10-27 DIAGNOSIS — L570 Actinic keratosis: Secondary | ICD-10-CM | POA: Diagnosis not present

## 2015-10-27 DIAGNOSIS — X32XXXA Exposure to sunlight, initial encounter: Secondary | ICD-10-CM | POA: Diagnosis not present

## 2015-10-27 DIAGNOSIS — L723 Sebaceous cyst: Secondary | ICD-10-CM | POA: Diagnosis not present

## 2015-10-27 DIAGNOSIS — C44319 Basal cell carcinoma of skin of other parts of face: Secondary | ICD-10-CM | POA: Diagnosis not present

## 2015-12-07 DIAGNOSIS — E119 Type 2 diabetes mellitus without complications: Secondary | ICD-10-CM | POA: Diagnosis not present

## 2015-12-07 DIAGNOSIS — Z8673 Personal history of transient ischemic attack (TIA), and cerebral infarction without residual deficits: Secondary | ICD-10-CM | POA: Diagnosis not present

## 2015-12-07 DIAGNOSIS — H532 Diplopia: Secondary | ICD-10-CM | POA: Diagnosis not present

## 2015-12-07 DIAGNOSIS — Z7982 Long term (current) use of aspirin: Secondary | ICD-10-CM | POA: Diagnosis not present

## 2015-12-07 DIAGNOSIS — H4911 Fourth [trochlear] nerve palsy, right eye: Secondary | ICD-10-CM | POA: Diagnosis not present

## 2015-12-07 DIAGNOSIS — I10 Essential (primary) hypertension: Secondary | ICD-10-CM | POA: Diagnosis not present

## 2015-12-07 DIAGNOSIS — I252 Old myocardial infarction: Secondary | ICD-10-CM | POA: Diagnosis not present

## 2015-12-07 DIAGNOSIS — Z87891 Personal history of nicotine dependence: Secondary | ICD-10-CM | POA: Diagnosis not present

## 2015-12-07 DIAGNOSIS — Z794 Long term (current) use of insulin: Secondary | ICD-10-CM | POA: Diagnosis not present

## 2015-12-07 DIAGNOSIS — H53461 Homonymous bilateral field defects, right side: Secondary | ICD-10-CM | POA: Diagnosis not present

## 2015-12-22 ENCOUNTER — Ambulatory Visit (INDEPENDENT_AMBULATORY_CARE_PROVIDER_SITE_OTHER): Payer: PPO

## 2015-12-22 DIAGNOSIS — Z23 Encounter for immunization: Secondary | ICD-10-CM | POA: Diagnosis not present

## 2016-01-13 DIAGNOSIS — I10 Essential (primary) hypertension: Secondary | ICD-10-CM | POA: Diagnosis not present

## 2016-01-13 DIAGNOSIS — E113299 Type 2 diabetes mellitus with mild nonproliferative diabetic retinopathy without macular edema, unspecified eye: Secondary | ICD-10-CM | POA: Diagnosis not present

## 2016-01-13 DIAGNOSIS — Z794 Long term (current) use of insulin: Secondary | ICD-10-CM | POA: Diagnosis not present

## 2016-01-13 DIAGNOSIS — E785 Hyperlipidemia, unspecified: Secondary | ICD-10-CM | POA: Diagnosis not present

## 2016-01-13 DIAGNOSIS — E1142 Type 2 diabetes mellitus with diabetic polyneuropathy: Secondary | ICD-10-CM | POA: Diagnosis not present

## 2016-01-25 ENCOUNTER — Ambulatory Visit (INDEPENDENT_AMBULATORY_CARE_PROVIDER_SITE_OTHER): Payer: PPO | Admitting: Family Medicine

## 2016-01-25 ENCOUNTER — Encounter: Payer: Self-pay | Admitting: Family Medicine

## 2016-01-25 VITALS — BP 144/70 | HR 56 | Temp 98.1°F | Wt 167.0 lb

## 2016-01-25 DIAGNOSIS — H532 Diplopia: Secondary | ICD-10-CM

## 2016-01-25 DIAGNOSIS — I69351 Hemiplegia and hemiparesis following cerebral infarction affecting right dominant side: Secondary | ICD-10-CM

## 2016-01-25 DIAGNOSIS — I639 Cerebral infarction, unspecified: Secondary | ICD-10-CM | POA: Diagnosis not present

## 2016-01-25 DIAGNOSIS — I10 Essential (primary) hypertension: Secondary | ICD-10-CM

## 2016-01-25 DIAGNOSIS — E1165 Type 2 diabetes mellitus with hyperglycemia: Secondary | ICD-10-CM

## 2016-01-25 DIAGNOSIS — IMO0002 Reserved for concepts with insufficient information to code with codable children: Secondary | ICD-10-CM

## 2016-01-25 DIAGNOSIS — E1140 Type 2 diabetes mellitus with diabetic neuropathy, unspecified: Secondary | ICD-10-CM | POA: Diagnosis not present

## 2016-01-25 DIAGNOSIS — E785 Hyperlipidemia, unspecified: Secondary | ICD-10-CM

## 2016-01-25 DIAGNOSIS — I5032 Chronic diastolic (congestive) heart failure: Secondary | ICD-10-CM

## 2016-01-25 DIAGNOSIS — F0631 Mood disorder due to known physiological condition with depressive features: Secondary | ICD-10-CM

## 2016-01-25 NOTE — Assessment & Plan Note (Signed)
euvolemic today.  

## 2016-01-25 NOTE — Assessment & Plan Note (Addendum)
Ongoing spasticity noted on exam. Continue baclofen. Not regular with HEP - encouraged he work on this. Declines outpatient PT/OT referral at this time - trouble with transportation as he nor wife currently drive.  Goal is to return to driving - planning on taking road test after next neuro-ophtho visit 03/2016.

## 2016-01-25 NOTE — Patient Instructions (Addendum)
You are doing well today. Blood pressure is a bit high. Work on low salt/sodium diet - goal <1.5gm (1,500mg ) per day. Eat a diet high in fruits/vegetables and whole grains.   Look into mediterranean diet. Look at [place] for more resources  Continue current medicines.       Mediterranean Diet  Why follow it? Research shows. . Those who follow the Mediterranean diet have a reduced risk of heart disease  . The diet is associated with a reduced incidence of Parkinson's and Alzheimer's diseases . People following the diet may have longer life expectancies and lower rates of chronic diseases  . The Dietary Guidelines for Americans recommends the Mediterranean diet as an eating plan to promote health and prevent disease  What Is the Mediterranean Diet?  . Healthy eating plan based on typical foods and recipes of Mediterranean-style cooking . The diet is primarily a plant based diet; these foods should make up a majority of meals   Starches - Plant based foods should make up a majority of meals - They are an important sources of vitamins, minerals, energy, antioxidants, and fiber - Choose whole grains, foods high in fiber and minimally processed items  - Typical grain sources include wheat, oats, barley, corn, brown rice, bulgar, farro, millet, polenta, couscous  - Various types of beans include chickpeas, lentils, fava beans, black beans, white beans   Fruits  Veggies - Large quantities of antioxidant rich fruits & veggies; 6 or more servings  - Vegetables can be eaten raw or lightly drizzled with oil and cooked  - Vegetables common to the traditional Mediterranean Diet include: artichokes, arugula, beets, broccoli, brussel sprouts, cabbage, carrots, celery, collard greens, cucumbers, eggplant, kale, leeks, [place], lettuce, mushrooms, okra, onions, peas, peppers, potatoes, pumpkin, radishes, rutabaga, shallots, spinach, sweet potatoes, turnips, zucchini - Fruits common to the Mediterranean  Diet include: apples, apricots, avocados, cherries, clementines, dates, figs, grapefruits, grapes, melons, nectarines, oranges, peaches, pears, pomegranates, strawberries, tangerines  Fats - Replace butter and margarine with healthy oils, such as olive oil, canola oil, and tahini  - Limit nuts to no more than a handful a day  - Nuts include walnuts, almonds, pecans, pistachios, pine nuts  - Limit or avoid candied, honey roasted or heavily salted nuts - Olives are central to the [employer] - can be eaten whole or used in a variety of dishes   Meats Protein - Limiting red meat: no more than a few times a month - When eating red meat: choose lean cuts and keep the portion to the size of deck of cards - Eggs: approx. 0 to 4 times a week  - Fish and lean poultry: at least 2 a week  - Healthy protein sources include, chicken, [place], lean beef, lamb - Increase intake of seafood such as tuna, salmon, trout, mackerel, shrimp, scallops - Avoid or limit high fat processed meats such as sausage and bacon  Dairy - Include moderate amounts of low fat dairy products  - Focus on healthy dairy such as fat free yogurt, skim milk, low or reduced fat cheese - Limit dairy products higher in fat such as whole or 2% milk, cheese, ice cream  Alcohol - Moderate amounts of red wine is ok  - No more than 5 oz daily for women (all ages) and men older than age 33  - No more than 10 oz of wine daily for men younger than 9  Other - Limit sweets and other desserts  - Use herbs  and spices instead of salt to flavor foods  - Herbs and spices common to the traditional Mediterranean Diet include: basil, bay leaves, chives, cloves, cumin, fennel, garlic, lavender, marjoram, mint, oregano, parsley, pepper, rosemary, sage, savory, sumac, tarragon, thyme   It's not just a diet, it's a lifestyle:  . The Mediterranean diet includes lifestyle factors typical of those in the region  . Foods, drinks and meals are best eaten  with others and savored . Daily physical activity is important for overall good health . This could be strenuous exercise like running and aerobics . This could also be more leisurely activities such as walking, housework, yard-work, or taking the stairs . Moderation is the key; a balanced and healthy diet accommodates most foods and drinks . Consider portion sizes and frequency of consumption of certain foods   Meal Ideas & Options:  . Breakfast:  o Whole wheat toast or whole wheat English muffins with peanut butter & hard boiled egg o Steel cut oats topped with apples & cinnamon and skim milk  o Fresh fruit: banana, strawberries, melon, berries, peaches  o Smoothies: strawberries, bananas, greek yogurt, peanut butter o Low fat greek yogurt with blueberries and granola  o Egg white omelet with spinach and mushrooms o Breakfast couscous: whole wheat couscous, apricots, skim milk, cranberries  . Sandwiches:  o Hummus and grilled vegetables (peppers, zucchini, squash) on whole wheat bread   o Grilled chicken on whole wheat pita with lettuce, tomatoes, cucumbers or tzatziki  o Tuna salad on whole wheat bread: tuna salad made with greek yogurt, olives, red peppers, capers, green onions o Garlic rosemary lamb pita: lamb sauted with garlic, rosemary, salt & pepper; add lettuce, cucumber, greek yogurt to pita - flavor with lemon juice and black pepper  . Seafood:  o Mediterranean grilled salmon, seasoned with garlic, basil, parsley, lemon juice and black pepper o Shrimp, lemon, and spinach whole-grain pasta salad made with low fat greek yogurt  o Seared scallops with lemon orzo  o Seared tuna steaks seasoned salt, pepper, coriander topped with tomato mixture of olives, tomatoes, olive oil, minced garlic, parsley, green onions and cappers  . Meats:  o Herbed greek chicken salad with kalamata olives, cucumber, feta  o Red bell peppers stuffed with spinach, bulgur, lean ground beef (or lentils) &  topped with feta   o Kebabs: skewers of chicken, tomatoes, onions, zucchini, squash  o [place] burgers: made with red onions, mint, dill, lemon juice, feta cheese topped with roasted red peppers . Vegetarian o Cucumber salad: cucumbers, artichoke hearts, celery, red onion, feta cheese, tossed in olive oil & lemon juice  o Hummus and whole grain pita points with a greek salad (lettuce, tomato, feta, olives, cucumbers, red onion) o Lentil soup with celery, carrots made with vegetable broth, garlic, salt and pepper  o Tabouli salad: parsley, bulgur, mint, scallions, cucumbers, tomato, radishes, lemon juice, olive oil, salt and pepper.

## 2016-01-25 NOTE — Assessment & Plan Note (Signed)
Appreciate neuro-ophtho care, to see prism specialist 03/2016

## 2016-01-25 NOTE — Assessment & Plan Note (Signed)
Bright spirits today - continue sertraline 25mg  daily.

## 2016-01-25 NOTE — Progress Notes (Signed)
BP (!) 144/70 (BP Location: Right Arm, Cuff Size: Large)   Pulse (!) 56   Temp 98.1 F (36.7 C) (Oral)   Wt 167 lb (75.8 kg)   BMI 25.39 kg/m    CC: 31mo f/u visit Subjective:    Patient ID: Jeffrey [name], male    DOB: 04-14-1940, 75 y.o.   MRN: [id number]  HPI: GIANNI HOUT is a 75 y.o. male presenting on 01/25/2016 for Follow-up   Here with wife. Completed outpatient PT 05/2015 which was very helpful. Not regular with HEP. He is on baclofen. Able to walk with cane.   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 Right handed. Referred to [facility] neuro-ophth for diplopia post stroke - to see prism expert in January.   He did receive HHPT August through September with [employer] benefits which was again helpful.   Trouble with transportation. Wife does not drive. His main goal is to be able to restart driving to improve independence.   Latest A1c 6.5% - sees endo, on insulin.   Relevant past medical, surgical, family and social history reviewed and updated as indicated. Interim medical history since our last visit reviewed. Allergies and medications reviewed and updated. Current Outpatient Prescriptions on File Prior to Visit  Medication Sig  . atorvastatin (LIPITOR) 80 MG tablet Take 80 mg by mouth at bedtime.  . baclofen (LIORESAL) 10 MG tablet Take 0.5 tablets (5 mg total) by mouth 3 (three) times daily.  . Cholecalciferol (VITAMIN D3) 5000 UNITS TABS Take 5,000 Units by mouth once a week.  . clopidogrel (PLAVIX) 75 MG tablet Take 1 tablet (75 mg total) by mouth daily.  . fosinopril ([name]) 40 MG tablet Take 40 mg by mouth daily.  . furosemide (LASIX) 40 MG tablet TAKE 1 TABLET (40 MG TOTAL) BY MOUTH DAILY.  [name] insulin glargine (LANTUS) 100 UNIT/ML injection Inject 18 Units into the skin at bedtime.   [name] LACTOBACILLUS PO Take 1 capsule by mouth daily. [facility]ips colon health  . loratadine (CLARITIN) 10 MG tablet Take 10 mg by  mouth daily as needed.   . senna (SENOKOT) 8.6 MG tablet Take 1 tablet by mouth daily as needed for constipation. Reported on 02/23/2015  . sertraline (ZOLOFT) 25 MG tablet Take 1 tablet (25 mg total) by mouth daily.   No current facility-administered medications on file prior to visit.     Review of Systems Per HPI unless specifically indicated in ROS section     Objective:    BP (!) 144/70 (BP Location: Right Arm, Cuff Size: Large)   Pulse (!) 56   Temp 98.1 F (36.7 C) (Oral)   Wt 167 lb (75.8 kg)   BMI 25.39 kg/m   Wt Readings from Last 3 Encounters:  01/25/16 167 lb (75.8 kg)  07/23/15 164 lb 12 oz (74.7 kg)  07/15/15 164 lb (74.4 kg)    Physical Exam  Constitutional: He is oriented to person, place, and time. He appears well-developed and well-nourished. No distress.  HENT:  Mouth/Throat: Oropharynx is clear and moist. No oropharyngeal exudate.  Cardiovascular: Normal rate, regular rhythm, normal heart sounds and intact distal pulses.   No murmur heard. Pulmonary/Chest: Effort normal and breath sounds normal. No respiratory distress. He has no wheezes. He has no rales.  Musculoskeletal: He exhibits no edema.  Neurological: He is alert and oriented to person, place, and time.  5/5 strength LUE, LLE 4+/5 strength RUE, 5/5 strength RLE  RUE spasticity present  Skin: Skin is warm and dry. No rash noted.  Psychiatric: He has a normal mood and affect.  Nursing note and vitals reviewed.  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      Relevant Medications   metoprolol tartrate (LOPRESSOR) 25 MG tablet   Diplopia    Appreciate neuro-ophtho care, to see prism specialist 03/2016      Essential hypertension    Chronic, mildly elevated today. Suggested they buy cuff to monitor bp at home. No med changes today.      Relevant Medications   metoprolol tartrate (LOPRESSOR) 25 MG tablet   Hemiparesis affecting right side as late effect of cerebrovascular accident ([place]) - Primary    Ongoing spasticity noted on exam. Continue baclofen. Not regular with HEP - encouraged he work on this. Declines outpatient PT/OT referral at this time - trouble with transportation as he nor wife currently drive.  Goal is to return to driving - planning on taking road test after next neuro-ophtho visit 03/2016.      Hyperlipidemia    Chronic, stable. Continue lipitor 80mg  daily. Recommended mediterranean diet.       Relevant Medications   metoprolol tartrate (LOPRESSOR) 25 MG tablet   Type 2 diabetes, uncontrolled, with neuropathy (HCC)   Relevant Medications   insulin aspart (NOVOLOG FLEXPEN) 100 UNIT/ML FlexPen       Follow up plan: Return in about 6 months (around 07/24/2016) for annual exam, prior fasting for blood work.  [name], MD

## 2016-01-25 NOTE — Assessment & Plan Note (Addendum)
Chronic, mildly elevated today. Suggested they buy cuff to monitor bp at home. No med changes today.

## 2016-01-25 NOTE — Assessment & Plan Note (Signed)
Chronic, stable. Continue lipitor 80mg  daily. Recommended mediterranean diet.

## 2016-01-25 NOTE — Progress Notes (Signed)
Pre visit review using our clinic review tool, if applicable. No additional management support is needed unless otherwise documented below in the visit note. 

## 2016-01-26 ENCOUNTER — Encounter: Payer: Self-pay | Admitting: Podiatry

## 2016-01-26 ENCOUNTER — Other Ambulatory Visit: Payer: Self-pay | Admitting: Family Medicine

## 2016-01-26 ENCOUNTER — Ambulatory Visit (INDEPENDENT_AMBULATORY_CARE_PROVIDER_SITE_OTHER): Payer: PPO | Admitting: Podiatry

## 2016-01-26 VITALS — BP 159/84 | HR 55 | Resp 16

## 2016-01-26 DIAGNOSIS — B351 Tinea unguium: Secondary | ICD-10-CM | POA: Diagnosis not present

## 2016-01-26 DIAGNOSIS — M79675 Pain in left toe(s): Secondary | ICD-10-CM | POA: Diagnosis not present

## 2016-01-26 DIAGNOSIS — M79674 Pain in right toe(s): Secondary | ICD-10-CM | POA: Diagnosis not present

## 2016-01-26 LAB — HM DIABETES FOOT EXAM

## 2016-01-26 NOTE — Patient Instructions (Signed)

## 2016-01-26 NOTE — Progress Notes (Signed)
Patient ID: ERJON KLUESNER, male   DOB: [birth year], 75 y.o.   MRN: [id number]    Subjective: This patient presents again today complaining of ongoing discomfort in his toenails and walking wearing shoes and is requesting nail debridement The patient's wife is present in the treatment room today  Objective: Orientated 3 DP and PT pulses 2/4 bilaterally Capillary reflex immediate bilaterally Sensation to 10 g monofilament wire intact 0/5 right and 5/5 left Vibratory sensation nonreactive bilaterally Ankle reflexes 3 cream reactive bilaterally AFO right Hammertoe second right Manual motor testing dorsi flexion, plantar flexion 5/5 bilaterally No Open skin lesions bilaterally The toenails are elongated, hypertrophic, deformed, discolored and tender direct palpation 6-10   Assessment: Type II diabetic with a history of neuropathy Symptomatic onychomycoses 6-10 History of CVA Gait disturbance  Plan: Debridement of toenails 10 mechanically and electrically without any bleeding  Reappoint 10 weeks

## 2016-01-27 NOTE — Telephone Encounter (Signed)
Ok to refill? Not on current med list. 

## 2016-01-31 ENCOUNTER — Encounter: Payer: Self-pay | Admitting: Family Medicine

## 2016-03-22 DIAGNOSIS — H5021 Vertical strabismus, right eye: Secondary | ICD-10-CM | POA: Insufficient documentation

## 2016-04-26 ENCOUNTER — Ambulatory Visit: Payer: PPO | Admitting: Podiatry

## 2016-04-26 DIAGNOSIS — X32XXXD Exposure to sunlight, subsequent encounter: Secondary | ICD-10-CM | POA: Diagnosis not present

## 2016-04-26 DIAGNOSIS — L570 Actinic keratosis: Secondary | ICD-10-CM | POA: Diagnosis not present

## 2016-04-26 DIAGNOSIS — L821 Other seborrheic keratosis: Secondary | ICD-10-CM | POA: Diagnosis not present

## 2016-05-10 ENCOUNTER — Encounter: Payer: Self-pay | Admitting: Podiatry

## 2016-05-10 ENCOUNTER — Ambulatory Visit (INDEPENDENT_AMBULATORY_CARE_PROVIDER_SITE_OTHER): Payer: PPO | Admitting: Podiatry

## 2016-05-10 DIAGNOSIS — M79674 Pain in right toe(s): Secondary | ICD-10-CM | POA: Diagnosis not present

## 2016-05-10 DIAGNOSIS — B351 Tinea unguium: Secondary | ICD-10-CM

## 2016-05-10 DIAGNOSIS — M79675 Pain in left toe(s): Secondary | ICD-10-CM

## 2016-05-10 DIAGNOSIS — E0842 Diabetes mellitus due to underlying condition with diabetic polyneuropathy: Secondary | ICD-10-CM | POA: Diagnosis not present

## 2016-05-10 NOTE — Patient Instructions (Signed)

## 2016-05-10 NOTE — Progress Notes (Signed)
Patient ID: Jeffrey [name], male   DOB: 11/07/1940, 76 y.o.   MRN: [id number]    Subjective: This patient presents again today complaining of ongoing discomfort in his toenails and walking wearing shoes and is requesting nail debridement The patient's wife is present in the treatment room today  Objective: Orientated 3 DP and PT pulses 2/4 bilaterally Capillary reflex immediate bilaterally Sensation to 10 g monofilament wire intact 0/5 right and 5/5 left Vibratory sensation nonreactive bilaterally Ankle reflexes 3 cream reactive bilaterally AFO right Hammertoe second right Manual motor testing dorsi flexion, plantar flexion 5/5 bilaterally No Open skin lesions bilaterally The toenails are elongated, hypertrophic, deformed, discolored and tender direct palpation 6-10   Assessment: 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  

## 2016-06-28 ENCOUNTER — Ambulatory Visit (INDEPENDENT_AMBULATORY_CARE_PROVIDER_SITE_OTHER): Payer: PPO | Admitting: Cardiology

## 2016-06-28 ENCOUNTER — Encounter: Payer: Self-pay | Admitting: Cardiology

## 2016-06-28 VITALS — BP 122/82 | HR 56 | Ht 68.0 in | Wt 173.0 lb

## 2016-06-28 DIAGNOSIS — I10 Essential (primary) hypertension: Secondary | ICD-10-CM

## 2016-06-28 DIAGNOSIS — I2510 Atherosclerotic heart disease of native coronary artery without angina pectoris: Secondary | ICD-10-CM | POA: Diagnosis not present

## 2016-06-28 DIAGNOSIS — I69351 Hemiplegia and hemiparesis following cerebral infarction affecting right dominant side: Secondary | ICD-10-CM

## 2016-06-28 NOTE — Progress Notes (Signed)
Cardiology Office Note   Date:  06/28/2016   ID:  Jeffrey [name], DOB Aug 15, 1940, MRN [id number]  PCP:  [name], MD  Cardiologist:   Candee Furbish, MD       History of Present Illness: Jeffrey [name] is a 76 y.o. male prior patient of Dr. Ron Parker here for follow up coronary artery disease status post PCI (he thinks PTCA-do not have report) with normal left ventricular function.  Unfortunately in March 2016 he had a stroke, hemorrhagic transformation, significant right hemiparesis come a rehabilitation and later returned with an episode of A. fib which improved. He had been on Plavix. No history of atrial fibrillation. He is a English as a second language teacher. Neurology did not feel the need for implantable loop recorder. He would be at increased risk for anticoagulation given hemorrhagic transformation.  Here with his family. He is retired [employer]. He is having no chest pain, no syncope, no shortness of breath, no anginal symptoms. In a wheelchair. Out of rehabilitation.   He is trying to work with the [place] to obtain a motorized wheelchair. He is also thinking about classes with the DM[employer] to possibly drive once again. He denies any chest pain, no shortness of breath. He has been bothered by a right elbow rash, has seen dermatology. He was also concerned about his left hand possible arthritis, hard to get motion in the morning but by the end of the day is feeling better.    Past Medical History:  Diagnosis Date  . Anterior cerebral circulation hemorrhagic infarction [facility]) 11/24/2014   March, 2016, dominant left thalamic and left internal capsule ischemic infarct with resultant hemorrhagic transformation, secondary to small vessel disease. Resulted in right hemiparesis dysarthria and diplopia   //   readmission with aphasia July, 2016, this improved   . Arthritis   . CAD (coronary artery disease)    PCI distal RCA ...2004, residual 70% LAD   /   ...nuclear...03/2007...no ischemia.Marland KitchenMarland Kitchenpreserved LV /   nuclear...03/03/2009...inferior scar..no ischemia..EF 51%  . Cyst of nasopharynx    per ENT Wilburn Cornelia  . Dyslipidemia    takes Atorvastatin daily  . GERD (gastroesophageal reflux disease)    takes Omeprazole daily  . Gout    takes Allopurinol daily and Colchicine as needed  . HCAP (healthcare-associated pneumonia)   . Hemiparesis affecting right side as late effect of cerebrovascular accident ([place]) 09/05/2014  . History of colon polyps   . HTN (hypertension)    takes Cardura,Metoprolol,[name],and Amlodipine daily  . Internal hemorrhoids   . Myocardial infarction [facility]) 55yrs ago  . Peripheral edema    takes Lasix daily  . 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 Right carotid bruit   . Seasonal allergic rhinitis   . Stroke ([place])   . Type 2 diabetes, uncontrolled, with neuropathy ([place]) 1989   takes Invokana daily and has an insulin pump (Dr. [name])    Past Surgical History:  Procedure Laterality Date  . BALLOON ANGIOPLASTY, ARTERY  1992, 2004   CAD, Dr. Ron Parker  . CARDIAC CATHETERIZATION  2004  . CATARACT EXTRACTION Bilateral 2010  . COLONOSCOPY  04/03/2002   adenomatous polyp, int hemorrhoids  . COLONOSCOPY  07/18/2007   normal (Dr. Fuller Plan)  . COLONOSCOPY  09/26/2012   tubular adenoma, sm int hem, rpt 5 yrs Fuller Plan)  . ELBOW SURGERY Right 1997  . eye lids raised    . NASAL SEPTUM SURGERY    . POLYPECTOMY N/A 05/27/2013  Procedure: ENDOSCOPIC NASOPHARYNGEAL MASS;  Surgeon: [name], MD  . [place]  09/2011   left, with subacromial decompression  . TONSILLECTOMY AND ADENOIDECTOMY       Current Outpatient Prescriptions  Medication Sig Dispense Refill  . atorvastatin (LIPITOR) 80 MG tablet Take 80 mg by mouth at bedtime.    . baclofen (LIORESAL) 10 MG tablet Take 0.5 tablets (5 mg total) by mouth 3 (three) times daily. 60 each 3  . Cholecalciferol (VITAMIN D3) 5000 UNITS TABS Take 5,000 Units by mouth once a  week.    . clopidogrel (PLAVIX) 75 MG tablet Take 1 tablet (75 mg total) by mouth daily. 30 tablet 6  . fosinopril ([name]) 40 MG tablet Take 40 mg by mouth daily.    . furosemide (LASIX) 40 MG tablet TAKE 1 TABLET (40 MG TOTAL) BY MOUTH DAILY. 30 tablet 6  . insulin aspart (NOVOLOG FLEXPEN) 100 UNIT/ML FlexPen Inject 5-6 Units into the skin as directed. 5 units with breakfast 6 units with supper    . insulin glargine (LANTUS) 100 UNIT/ML injection Inject 18 Units into the skin at bedtime.     [name] LACTOBACILLUS PO Take 1 capsule by mouth daily. [facility]    . loratadine (CLARITIN) 10 MG tablet Take 10 mg by mouth daily as needed.     . metoprolol tartrate (LOPRESSOR) 25 MG tablet Take 25 mg by mouth 2 (two) times daily.    [name] senna (SENOKOT) 8.6 MG tablet Take 1 tablet by mouth daily as needed for constipation. Reported on 02/23/2015    . sertraline (ZOLOFT) 25 MG tablet Take 1 tablet (25 mg total) by mouth daily. 30 tablet 11   No current facility-administered medications for this visit.     Allergies:   Niacin    Social History:  The patient  reports that he has quit smoking. He has never used smokeless tobacco. He reports that he does not drink alcohol or use drugs.   Family History:  The patient's family history includes Alcohol abuse in his father; Heart attack in his father and son.    ROS:  Please see the history of present illness. Unless specified above all other review of systems negative.    PHYSICAL EXAM: VS:  BP 122/82   Pulse (!) 56   Ht 5\' 8"  (1.727 m)   Wt 173 lb (78.5 kg)   BMI 26.30 kg/m  , BMI Body mass index is 26.3 kg/m. GEN: Well nourished, well developed, in no acute distress  HEENT: Normal  Neck: no JVD, carotid bruits, or masses Cardiac: Regular rate and rhythm; no murmurs, rubs, or gallops,no edema  Respiratory:  clear to auscultation bilaterally, normal work of breathing GI: soft, nontender, nondistended, + BS MS: no deformity or atrophy    Skin: warm and dry, right elbow rash Neuro:  Right-sided weakness currently in wheelchair. Psych: euthymic mood, full affect   EKG:  06/28/16 - SB 56 no changes, personally viewed- previously monitoring in hospital showed no evidence of atrial fibrillation.  ECHO: 06/09/14: - Left ventricle: The cavity size was normal. Wall thickness was normal. Systolic function was normal. The estimated ejection fraction was in the range of 55% to 60%. Regional wall motion abnormalities cannot be excluded. Doppler parameters are consistent with abnormal left ventricular relaxation (grade 1 diastolic dysfunction).   Recent Labs: 07/16/2015: ALT 29; BUN 18; Creatinine, Ser 0.84; Hemoglobin 14.6; Platelets 201.0; Potassium 3.9; Sodium 143; TSH 0.91    Lipid Panel  Component Value Date/Time   CHOL 125 07/16/2015 0950   CHOL 211 02/01/2011   TRIG 145.0 07/16/2015 0950   TRIG 342 09/30/2013   TRIG 342 09/30/2013   HDL 26.10 (L) 07/16/2015 0950   CHOLHDL 5 07/16/2015 0950   VLDL 29.0 07/16/2015 0950   LDLCALC 70 07/16/2015 0950   LDLCALC 98 09/30/2013   LDLCALC 98 09/30/2013   LDLDIRECT 89.6 02/07/2012 1457      Wt Readings from Last 3 Encounters:  06/28/16 173 lb (78.5 kg)  01/25/16 167 lb (75.8 kg)  07/23/15 164 lb 12 oz (74.7 kg)      Other studies Reviewed: Additional studies/ records that were reviewed today include: Hospital records reviewed, lab work reviewed Review of the above records demonstrates: As above   ASSESSMENT AND PLAN:  Coronary artery disease status post PCI/PTCA of distal RCA 2004  - 2010 nuclear stress test showed inferior scar, no ischemia  - Ejection fraction normal on echo in April 2016.  - Continuing with Plavix post stroke.  - Stable, no changes. No angina.  Stroke with hemorrhagic transformation, right-sided hemiparesis  - Plavix, no bleeding.   - Reviewed note from Dr. Leonie Man  - Continue to optimize diabetes, hyperlipidemia, blood pressure  targets. Doing well.   - Overall stable.  Essential hypertension  - Normal today.  - Medications reviewed. Doing well. Well controlled on constellation of medications as above.  Hyperlipidemia  - LDL in the 60s. Excellent.  - Continue with high-dose atorvastatin. Overall doing well. No myalgias.  Diabetes with polyneuropathy -Dr. Buddy Duty, hemoglobin A1c 6.6 diabetic polyneuropathy. That and 1.02. TSH 0.97. Triglycerides have been elevated 500 in the past.  Elbow rash/left hand arthritis type symptoms  - Continue to address with Dr. Danise Mina. He has seen dermatology.   Current medicines are reviewed at length with the patient today.  The patient does not have concerns regarding medicines.  The following changes have been made:  no change  Labs/ tests ordered today include:   Orders Placed This Encounter  Procedures  . EKG 12-Lead     Disposition:   FU with [place] in 12 months  Signed, Candee Furbish, MD  06/28/2016 10:48 AM    [facility] [place], [place], [place]  [address] Phone: [phone]; Fax: [phone]

## 2016-06-28 NOTE — Patient Instructions (Signed)

## 2016-07-20 DIAGNOSIS — Z794 Long term (current) use of insulin: Secondary | ICD-10-CM | POA: Diagnosis not present

## 2016-07-20 DIAGNOSIS — I10 Essential (primary) hypertension: Secondary | ICD-10-CM | POA: Diagnosis not present

## 2016-07-20 DIAGNOSIS — E113299 Type 2 diabetes mellitus with mild nonproliferative diabetic retinopathy without macular edema, unspecified eye: Secondary | ICD-10-CM | POA: Diagnosis not present

## 2016-07-20 DIAGNOSIS — E1142 Type 2 diabetes mellitus with diabetic polyneuropathy: Secondary | ICD-10-CM | POA: Diagnosis not present

## 2016-07-20 DIAGNOSIS — E785 Hyperlipidemia, unspecified: Secondary | ICD-10-CM | POA: Diagnosis not present

## 2016-07-20 LAB — MICROALBUMIN, URINE: MICROALB UR: 0.7

## 2016-07-20 LAB — LIPID PANEL
CHOLESTEROL: 135 mg/dL (ref 0–200)
HDL: 31 mg/dL — AB (ref 35–70)
LDL Cholesterol: 65 mg/dL
Triglycerides: 191 mg/dL — AB (ref 40–160)

## 2016-07-20 LAB — HEPATIC FUNCTION PANEL
ALT: 27 U/L (ref 10–40)
AST: 17 U/L (ref 14–40)
BILIRUBIN, TOTAL: 0.8 mg/dL

## 2016-07-20 LAB — HEMOGLOBIN A1C: Hemoglobin A1C: 6.9

## 2016-07-20 LAB — TSH: TSH: 0.71 u[IU]/mL (ref <=5.90)

## 2016-07-20 LAB — BASIC METABOLIC PANEL
Creatinine: 0.9 mg/dL (ref <=1.3)
Potassium: 3.7 mmol/L (ref 3.4–5.3)

## 2016-07-23 ENCOUNTER — Encounter: Payer: Self-pay | Admitting: Family Medicine

## 2016-07-27 ENCOUNTER — Ambulatory Visit (INDEPENDENT_AMBULATORY_CARE_PROVIDER_SITE_OTHER)
Admission: RE | Admit: 2016-07-27 | Discharge: 2016-07-27 | Disposition: A | Discharge status: Home / Self Care | Payer: PPO | Source: Ambulatory Visit | Attending: Family Medicine | Admitting: Family Medicine

## 2016-07-27 ENCOUNTER — Encounter: Payer: Self-pay | Admitting: Family Medicine

## 2016-07-27 ENCOUNTER — Ambulatory Visit (INDEPENDENT_AMBULATORY_CARE_PROVIDER_SITE_OTHER): Payer: PPO | Admitting: Family Medicine

## 2016-07-27 VITALS — BP 118/72 | HR 58 | Temp 98.3°F | Ht 68.0 in | Wt 168.5 lb

## 2016-07-27 DIAGNOSIS — E785 Hyperlipidemia, unspecified: Secondary | ICD-10-CM | POA: Diagnosis not present

## 2016-07-27 DIAGNOSIS — I69351 Hemiplegia and hemiparesis following cerebral infarction affecting right dominant side: Secondary | ICD-10-CM

## 2016-07-27 DIAGNOSIS — I69398 Other sequelae of cerebral infarction: Secondary | ICD-10-CM

## 2016-07-27 DIAGNOSIS — Z0000 Encounter for general adult medical examination without abnormal findings: Secondary | ICD-10-CM | POA: Insufficient documentation

## 2016-07-27 DIAGNOSIS — M79642 Pain in left hand: Secondary | ICD-10-CM

## 2016-07-27 DIAGNOSIS — F0631 Mood disorder due to known physiological condition with depressive features: Secondary | ICD-10-CM

## 2016-07-27 DIAGNOSIS — I5032 Chronic diastolic (congestive) heart failure: Secondary | ICD-10-CM

## 2016-07-27 DIAGNOSIS — E1110 Type 2 diabetes mellitus with ketoacidosis without coma: Secondary | ICD-10-CM | POA: Diagnosis not present

## 2016-07-27 DIAGNOSIS — H532 Diplopia: Secondary | ICD-10-CM

## 2016-07-27 DIAGNOSIS — Z0001 Encounter for general adult medical examination with abnormal findings: Secondary | ICD-10-CM | POA: Insufficient documentation

## 2016-07-27 DIAGNOSIS — I10 Essential (primary) hypertension: Secondary | ICD-10-CM | POA: Diagnosis not present

## 2016-07-27 DIAGNOSIS — Z7189 Other specified counseling: Secondary | ICD-10-CM | POA: Diagnosis not present

## 2016-07-27 DIAGNOSIS — E1140 Type 2 diabetes mellitus with diabetic neuropathy, unspecified: Secondary | ICD-10-CM

## 2016-07-27 NOTE — Assessment & Plan Note (Signed)
Appreciate endo care.  

## 2016-07-27 NOTE — Assessment & Plan Note (Signed)
Residual homonymous hemianopsia after CVA 2016, wears prisms.

## 2016-07-27 NOTE — Assessment & Plan Note (Signed)
Chronic, stable. Continue sertraline 25mg  daily.

## 2016-07-27 NOTE — Assessment & Plan Note (Signed)
New over last 2 months after injury while moving boxes. Anticipate MCP strain, but will check xrays to r/o small chip fracture. Discussed heat, ice, tylenol

## 2016-07-27 NOTE — Assessment & Plan Note (Signed)
Preventative protocols reviewed and updated unless pt declined. Discussed healthy diet and lifestyle.  

## 2016-07-27 NOTE — Assessment & Plan Note (Signed)
Chronic, stable. Continue current regimen. 

## 2016-07-27 NOTE — Patient Instructions (Addendum)
Check on new 2 shot shingles series (shingrix) at local pharmacy or [place].  Advanced directive packet provided today.  For left hand pain - use heating pad or ice (whichever soothes better), use tylenol 500mg  with meals as needed for discomfort. Xray today. Possible wear and tear arthritis or residual from finger strain.  You are doing well today, return as needed or in 1 yr for wellness visit with Katha Cabal and physical with me.   Health Maintenance, Male A healthy lifestyle and preventive care is important for your health and wellness. Ask your health care provider about what schedule of regular examinations is right for you. What should I know about weight and diet?  Eat a Healthy Diet  Eat plenty of vegetables, fruits, whole grains, low-fat dairy products, and lean protein.  Do not eat a lot of foods high in solid fats, added sugars, or salt. Maintain a Healthy Weight  Regular exercise can help you achieve or maintain a healthy weight. You should:  Do at least 150 minutes of exercise each week. The exercise should increase your heart rate and make you sweat (moderate-intensity exercise).  Do strength-training exercises at least twice a week. Watch Your Levels of Cholesterol and Blood Lipids  Have your blood tested for lipids and cholesterol every 5 years starting at 74 years of age. If you are at high risk for heart disease, you should start having your blood tested when you are 74 years old. You may need to have your cholesterol levels checked more often if:  Your lipid or cholesterol levels are high.  You are older than 74 years of age.  You are at high risk for heart disease. What should I know about cancer screening? Many types of cancers can be detected early and may often be prevented. Lung Cancer  You should be screened every year for lung cancer if:  You are a current smoker who has smoked for at least 30 years.  You are a former smoker who has quit within the past 15  years.  Talk to your health care provider about your screening options, when you should start screening, and how often you should be screened. Colorectal Cancer  Routine colorectal cancer screening usually begins at 73 years of age and should be repeated every 5-10 years until you are 74 years old. You may need to be screened more often if early forms of precancerous polyps or small growths are found. Your health care provider may recommend screening at an earlier age if you have risk factors for colon cancer.  Your health care provider may recommend using home test kits to check for hidden blood in the stool.  A small camera at the end of a tube can be used to examine your colon (sigmoidoscopy or colonoscopy). This checks for the earliest forms of colorectal cancer. Prostate and Testicular Cancer  Depending on your age and overall health, your health care provider may do certain tests to screen for prostate and testicular cancer.  Talk to your health care provider about any symptoms or concerns you have about testicular or prostate cancer. Skin Cancer  Check your skin from head to toe regularly.  Tell your health care provider about any new moles or changes in moles, especially if:  There is a change in a mole's size, shape, or color.  You have a mole that is larger than a pencil eraser.  Always use sunscreen. Apply sunscreen liberally and repeat throughout the day.  Protect yourself by wearing  long sleeves, pants, a wide-brimmed hat, and sunglasses when outside. What should I know about heart disease, diabetes, and high blood pressure?  If you are 68-65 years of age, have your blood pressure checked every 3-5 years. If you are 29 years of age or older, have your blood pressure checked every year. You should have your blood pressure measured twice-once when you are at a hospital or clinic, and once when you are not at a hospital or clinic. Record the average of the two measurements. To  check your blood pressure when you are not at a hospital or clinic, you can use:  An automated blood pressure machine at a pharmacy.  A home blood pressure monitor.  Talk to your health care provider about your target blood pressure.  If you are between 68-5 years old, ask your health care provider if you should take aspirin to prevent heart disease.  Have regular diabetes screenings by checking your fasting blood sugar level.  If you are at a normal weight and have a low risk for diabetes, have this test once every three years after the age of 4.  If you are overweight and have a high risk for diabetes, consider being tested at a younger age or more often.  A one-time screening for abdominal aortic aneurysm (AAA) by ultrasound is recommended for men aged 37-75 years who are current or former smokers. What should I know about preventing infection? Hepatitis B  If you have a higher risk for hepatitis B, you should be screened for this virus. Talk with your health care provider to find out if you are at risk for hepatitis B infection. Hepatitis C  Blood testing is recommended for:  Everyone born from [birth year] through 1963.  Anyone with known risk factors for hepatitis C. Sexually Transmitted Diseases (STDs)  You should be screened each year for STDs including gonorrhea and chlamydia if:  You are sexually active and are younger than 74 years of age.  You are older than 74 years of age and your health care provider tells you that you are at risk for this type of infection.  Your sexual activity has changed since you were last screened and you are at an increased risk for chlamydia or gonorrhea. Ask your health care provider if you are at risk.  Talk with your health care provider about whether you are at high risk of being infected with HIV. Your health care provider may recommend a prescription medicine to help prevent HIV infection. What else can I do?  Schedule regular health,  dental, and eye exams.  Stay current with your vaccines (immunizations).  Do not use any tobacco products, such as cigarettes, chewing tobacco, and e-cigarettes. If you need help quitting, ask your health care provider.  Limit alcohol intake to no more than 2 drinks per day. One drink equals 12 ounces of beer, 5 ounces of wine, or 1 ounces of hard liquor.  Do not use street drugs.  Do not share needles.  Ask your health care provider for help if you need support or information about quitting drugs.  Tell your health care provider if you often feel depressed.  Tell your health care provider if you have ever been abused or do not feel safe at home. This information is not intended to replace advice given to you by your health care provider. Make sure you discuss any questions you have with your health care provider. Document Released: 08/20/2007 Document Revised: 10/21/2015 Document Reviewed: 11/25/2014  Elsevier Interactive Patient Education  2017 [employer]  

## 2016-07-27 NOTE — Assessment & Plan Note (Signed)
Appreciate neuro care. Encouraged more regularly do HEP.

## 2016-07-27 NOTE — Assessment & Plan Note (Signed)
euvolemic today.  

## 2016-07-27 NOTE — Assessment & Plan Note (Signed)
Advanced directive discussion - HCPOA would be wife then 2 children. Thought he had at home, but did not find copy. We have provided today with advanced directive packet.

## 2016-07-27 NOTE — Progress Notes (Signed)
BP 118/72   Pulse (!) 58   Temp 98.3 F (36.8 C)   Ht 5\' 8"  (1.727 m)   Wt 168 lb 8 oz (76.4 kg)   SpO2 95%   BMI 25.62 kg/m    CC: medicare wellness visit Subjective:    Patient ID: Jeffrey [name], male    DOB: 04/19/1940, 76 y.o.   MRN: [id number]  HPI: Jeffrey [name] is a 76 y.o. male presenting on 07/27/2016 for Medicare Wellness   Hemorrhagic L thalamus stroke 05/2014 with residual R hemiparesis, dysmetria of RUE and RLE, diplopia. Right handed. Referred to [facility] neuro-ophth for diplopia post stroke  Has not seen [place]. Saw cardiology Dr Marlou Porch last month, note reviewed.  Saw neurology Dr Leonie Man as well as ophthalmology Dr Hassell Done for persistent diplopia with R homonymous hemianopsia s/p CVA 2016. Endocrinology Dr Buddy Duty  Working with DMV to resume driving ability.   Injured L index finger 1-2 months ago while moving boxes of books. Denies redness, swelling or warmth of joint. Has been using wrist brace. Has not tried med for this.  Seeing derm for bumpy R elbow skin rash (Dr [place] Crane). Currently treating with compounded salicylic acid cream.   Hearing screen - hearing aides Vision screen - with eye doctor, passed today Fall risk screen - passed Depression screen - passed  Preventative: COLONOSCOPY Date: 09/26/2012 tubular adenoma, sm int hem, rpt 5 yrs [place] Plan) Prostate cancer screening - no fmhx prostate cancer. None recent, no problems in the past. 1+ nocturia fills 2 urinals. Declines screening at this time.  Lung cancer screening - not candidate - remote smoker Flu shot - yearly Prevnar 2015, pneumovax 2014 Zoster - 2013.  shingrix - declines Advanced directive discussion - HCPOA would be wife then 2 children. Thought he had at home, but did not find copy. We have provided today with advanced directive packet. Seat belt use discussed Sunscreen use and skin screen discussed, no changing moles on skin.  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 Interim medical history since our last visit reviewed. Allergies and medications reviewed and updated. Outpatient Medications Prior to Visit  Medication Sig Dispense Refill  . atorvastatin (LIPITOR) 80 MG tablet Take 80 mg by mouth at bedtime.    . baclofen (LIORESAL) 10 MG tablet Take 0.5 tablets (5 mg total) by mouth 3 (three) times daily. 60 each 3  . Cholecalciferol (VITAMIN D3) 5000 UNITS TABS Take 5,000 Units by mouth once a week.    . clopidogrel (PLAVIX) 75 MG tablet Take 1 tablet (75 mg total) by mouth daily. 30 tablet 6  . fosinopril ([name]) 40 MG tablet Take 40 mg by mouth daily.    . furosemide (LASIX) 40 MG tablet TAKE 1 TABLET (40 MG TOTAL) BY MOUTH DAILY. 30 tablet 6  . insulin aspart (NOVOLOG FLEXPEN) 100 UNIT/ML FlexPen Inject 5-6 Units into the skin as directed. 5 units with breakfast 6 units with supper    . insulin glargine (LANTUS) 100 UNIT/ML injection Inject 18 Units into the skin at bedtime.     [name] LACTOBACILLUS PO Take 1 capsule by mouth daily. philips colon health    . loratadine (  CLARITIN) 10 MG tablet Take 10 mg by mouth daily as needed.     . metoprolol tartrate (LOPRESSOR) 25 MG tablet Take 25 mg by mouth 2 (two) times daily.    [name] senna (SENOKOT) 8.6 MG tablet Take 1 tablet by mouth daily as needed for constipation. Reported on 02/23/2015    . sertraline (ZOLOFT) 25 MG tablet Take 1 tablet (25 mg total) by mouth daily. 30 tablet 11   No facility-administered medications prior to visit.      Per HPI unless specifically indicated in ROS section below Review of Systems  Constitutional: Negative for activity change, appetite change, chills, fatigue,  fever and unexpected weight change.  HENT: Negative for hearing loss.   Eyes: Negative for visual disturbance.  Respiratory: Negative for cough, chest tightness, shortness of breath and wheezing.   Cardiovascular: Negative for chest pain, palpitations and leg swelling.  Gastrointestinal: Negative for abdominal distention, abdominal pain, blood in stool, constipation, diarrhea, nausea and vomiting.  Genitourinary: Negative for difficulty urinating and hematuria.  Musculoskeletal: Negative for arthralgias, myalgias and neck pain.  Skin: Negative for rash.  Neurological: Negative for dizziness, seizures, syncope and headaches.  Hematological: Negative for adenopathy. Does not bruise/bleed easily.  Psychiatric/Behavioral: Negative for dysphoric mood. The patient is not nervous/anxious.        Objective:    BP 118/72   Pulse (!) 58   Temp 98.3 F (36.8 C)   Ht 5\' 8"  (1.727 m)   Wt 168 lb 8 oz (76.4 kg)   SpO2 95%   BMI 25.62 kg/m   Wt Readings from Last 3 Encounters:  07/27/16 168 lb 8 oz (76.4 kg)  06/28/16 173 lb (78.5 kg)  01/25/16 167 lb (75.8 kg)    Physical Exam  Constitutional: He is oriented to person, place, and time. He appears well-developed and well-nourished. No distress.  HENT:  Head: Normocephalic and atraumatic.  Right Ear: Hearing, tympanic membrane, external ear and ear canal normal.  Left Ear: Hearing, tympanic membrane, external ear and ear canal normal.  Nose: Nose normal.  Mouth/Throat: Uvula is midline, oropharynx is clear and moist and mucous membranes are normal. No oropharyngeal exudate, posterior oropharyngeal edema or posterior oropharyngeal erythema.  Eyes: Conjunctivae and EOM are normal. Pupils are equal, round, and reactive to light. No scleral icterus.  Neck: Normal range of motion. Neck supple. No thyromegaly present.  Cardiovascular: Normal rate, regular rhythm, normal heart sounds and intact distal pulses.   No murmur heard. Pulses:       Radial pulses are 2+ on the right side, and 2+ on the left side.  Pulmonary/Chest: Effort normal and breath sounds normal. No respiratory distress. He has no wheezes. He has no rales.  Abdominal: Soft. Bowel sounds are normal. He exhibits no distension and no mass. There is no tenderness. There is no rebound and no guarding.  Musculoskeletal: Normal range of motion. He exhibits no edema.  2+ rad pulses Tender to palpation left index MCP without erythema or edema or warmth.  Tender with forced extension at MCP.  Lymphadenopathy:    He has no cervical adenopathy.  Neurological: He is alert and oriented to person, place, and time.  CN grossly intact, station and gait intact Recall 0/3, 1/3 with cue Calculation 4/5 serial 3s  Skin: Skin is warm and dry. No rash noted.  Psychiatric: He has a normal mood and affect. His behavior is normal. Judgment and thought content normal.  Nursing note and vitals reviewed.  Results for  orders placed or performed in visit on 07/23/16  Microalbumin, urine  Result Value Ref Range   Microalb, Ur 0.7   Basic metabolic panel  Result Value Ref Range   Creatinine 0.9 0.6 - 1.3 mg/dL   Potassium 3.7 3.4 - 5.3 mmol/L  Lipid panel  Result Value Ref Range   Triglycerides 191 (A) 40 - 160 mg/dL   Cholesterol 135 0 - 200 mg/dL   HDL 31 (A) 35 - 70 mg/dL   LDL Cholesterol 65 mg/dL  Hepatic function panel  Result Value Ref Range   ALT 27 10 - 40 U/L   AST 17 14 - 40 U/L   Bilirubin, Total 0.8 mg/dL  Hemoglobin A1c  Result Value Ref Range   Hemoglobin A1C 6.9   TSH  Result Value Ref Range   TSH 0.71 0.41 - 5.90 uIU/mL      Assessment & Plan:   Problem List Items Addressed This Visit    Advanced care planning/counseling discussion    Advanced directive discussion - HCPOA would be wife then 2 children. Thought he had at home, but did not find copy. We have provided today with advanced directive packet.      Chronic diastolic heart failure, NYHA class 1  ([place])    euvolemic today.       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    Chronic, stable. Continue sertraline 25mg  daily.      Diplopia    Residual homonymous hemianopsia after CVA 2016, wears prisms.      Essential hypertension    Chronic, stable. Continue current regimen.      Health maintenance examination    Preventative protocols reviewed and updated unless pt declined. Discussed healthy diet and lifestyle.       Hemiparesis affecting right side as late effect of cerebrovascular accident [facility])    Appreciate neuro care. Encouraged more regularly do HEP.       Hyperlipidemia    Chronic, stable. Continue current regimen.       Left hand pain    New over last 2 months after injury while moving boxes. Anticipate MCP strain, but will check xrays to r/o small chip fracture. Discussed heat, ice, tylenol      Relevant Orders   DG Hand Complete Left   Medicare annual wellness visit, subsequent - Primary    I have personally reviewed the Medicare Annual Wellness questionnaire and have noted 1. The patient's medical and social history 2. Their use of alcohol, tobacco or illicit drugs 3. Their current medications and supplements 4. The patient's functional ability including ADL's, fall risks, home safety risks and hearing or visual impairment. Cognitive function has been assessed and addressed as indicated.  5. Diet and physical activity 6. Evidence for depression or mood disorders The patients weight, height, BMI have been recorded in the chart. I have made referrals, counseling and provided education to the patient based on review of the above and I have provided the pt with a written personalized care plan for preventive services. Provider list updated.. See scanned questionairre as needed for further documentation. Reviewed preventative protocols and updated unless pt declined.       Type 2 diabetes, controlled, with neuropathy ([place])    Appreciate endo care.            Follow up plan: Return in about 1 year (around 07/27/2017) for annual exam, prior fasting for blood work, medicare wellness visit.  [name], MD

## 2016-07-27 NOTE — Assessment & Plan Note (Signed)

## 2016-08-08 ENCOUNTER — Other Ambulatory Visit: Payer: Self-pay | Admitting: *Deleted

## 2016-08-08 MED ORDER — FUROSEMIDE 40 MG PO TABS: 40.0000 mg | ORAL_TABLET | Freq: Every day | ORAL | 10 refills | Status: DC

## 2016-08-08 NOTE — Telephone Encounter (Signed)
Patient's wife left a voicemail requesting a refill on his furosemide. Patient was seen in May. Refill sent to pharmacy as requested and per protocol.  [facility]  Patient's wife advised by telephone that refill has been sent in electronically.

## 2016-08-09 DIAGNOSIS — L821 Other seborrheic keratosis: Secondary | ICD-10-CM | POA: Diagnosis not present

## 2016-08-10 ENCOUNTER — Encounter: Payer: Self-pay | Admitting: Podiatry

## 2016-08-10 ENCOUNTER — Ambulatory Visit (INDEPENDENT_AMBULATORY_CARE_PROVIDER_SITE_OTHER): Payer: PPO | Admitting: Podiatry

## 2016-08-10 VITALS — BP 176/80 | HR 55 | Resp 18

## 2016-08-10 DIAGNOSIS — B351 Tinea unguium: Secondary | ICD-10-CM

## 2016-08-10 DIAGNOSIS — M79674 Pain in right toe(s): Secondary | ICD-10-CM

## 2016-08-10 DIAGNOSIS — M79675 Pain in left toe(s): Secondary | ICD-10-CM

## 2016-08-10 DIAGNOSIS — E0842 Diabetes mellitus due to underlying condition with diabetic polyneuropathy: Secondary | ICD-10-CM

## 2016-08-10 NOTE — Progress Notes (Signed)
Patient ID: Jeffrey [name], male   DOB: 1940/12/25, 76 y.o.   MRN: [id number]    Subjective: This patient presents again today complaining of ongoing discomfort in his toenails and walking wearing shoes and is requesting nail debridement The patient's wife is present in the treatment room today  Objective: Orientated 3 DP and PT pulses 2/4 bilaterally Capillary reflex immediate bilaterally Sensation to 10 g monofilament wire intact 0/5 right and 5/5 left Vibratory sensation nonreactive bilaterally Ankle reflexes 3 cream reactive bilaterally AFO right Hammertoe second right Manual motor testing dorsi flexion, plantar flexion 5/5 bilaterally No Open skin lesions bilaterally The toenails are elongated, hypertrophic, deformed, discolored and tender direct palpation 6-10 Low-grade erythema around the lateral margin second left toenail lwith no drainage or warmth  Assessment: Type II diabetic with a history of neuropathy Symptomatic onychomycoses 6-10 History of CVA Gait disturbance Onychia second left toe  Plan: Debridement of toenails 10 mechanically and electrically without any bleeding Apply topical antibiotic ointment and Band-Aid the second left toe. Patient instructed continue applying topical antibiotic ointment and a Band-Aid daily until the redness resolves. Instructed to observe area if you notice any sudden increase in pain, swelling, redness, fever present to the emergency department or contact our office  Reappoint  3 months

## 2016-08-10 NOTE — Patient Instructions (Signed)
There was a low-grade redness around the side of the second left toenail today. Decided the nail was trimmed and a topical antibiotic ointment was applied. Continue apply topical antibiotic ointment and a Band-Aid daily to the second left toe. If you notice any sudden increase in pain, swelling, redness contact our office or present to the emergency department  Diabetes and Foot Care Diabetes may cause you to have problems because of poor blood supply (circulation) to your feet and legs. This may cause the skin on your feet to become thinner, break easier, and heal more slowly. Your skin may become dry, and the skin may peel and crack. You may also have nerve damage in your legs and feet causing decreased feeling in them. You may not notice minor injuries to your feet that could lead to infections or more serious problems. Taking care of your feet is one of the most important things you can do for yourself. Follow these instructions at home:  Wear shoes at all times, even in the house. Do not go barefoot. Bare feet are easily injured.  Check your feet daily for blisters, cuts, and redness. If you cannot see the bottom of your feet, use a mirror or ask someone for help.  Wash your feet with warm water (do not use hot water) and mild soap. Then pat your feet and the areas between your toes until they are completely dry. Do not soak your feet as this can dry your skin.  Apply a moisturizing lotion or petroleum jelly (that does not contain alcohol and is unscented) to the skin on your feet and to dry, brittle toenails. Do not apply lotion between your toes.  Trim your toenails straight across. Do not dig under them or around the cuticle. File the edges of your nails with an emery board or nail file.  Do not cut corns or calluses or try to remove them with medicine.  Wear clean socks or stockings every day. Make sure they are not too tight. Do not wear knee-high stockings since they may decrease blood  flow to your legs.  Wear shoes that fit properly and have enough cushioning. To break in new shoes, wear them for just a few hours a day. This prevents you from injuring your feet. Always look in your shoes before you put them on to be sure there are no objects inside.  Do not cross your legs. This may decrease the blood flow to your feet.  If you find a minor scrape, cut, or break in the skin on your feet, keep it and the skin around it clean and dry. These areas may be cleansed with mild soap and water. Do not cleanse the area with peroxide, alcohol, or iodine.  When you remove an adhesive bandage, be sure not to damage the skin around it.  If you have a wound, look at it several times a day to make sure it is healing.  Do not use heating pads or hot water bottles. They may burn your skin. If you have lost feeling in your feet or legs, you may not know it is happening until it is too late.  Make sure your health care provider performs a complete foot exam at least annually or more often if you have foot problems. Report any cuts, sores, or bruises to your health care provider immediately. Contact a health care provider if:  You have an injury that is not healing.  You have cuts or breaks in the  skin.  You have an ingrown nail.  You notice redness on your legs or feet.  You feel burning or tingling in your legs or feet.  You have pain or cramps in your legs and feet.  Your legs or feet are numb.  Your feet always feel cold. Get help right away if:  There is increasing redness, swelling, or pain in or around a wound.  There is a red line that goes up your leg.  Pus is coming from a wound.  You develop a fever or as directed by your health care provider.  You notice a bad smell coming from an ulcer or wound. This information is not intended to replace advice given to you by your health care provider. Make sure you discuss any questions you have with your health care  provider. Document Released: 02/19/2000 Document Revised: 07/30/2015 Document Reviewed: 07/31/2012 Elsevier Interactive Patient Education  2017 [employer].

## 2016-10-25 DIAGNOSIS — Z961 Presence of intraocular lens: Secondary | ICD-10-CM | POA: Diagnosis not present

## 2016-10-25 DIAGNOSIS — E113293 Type 2 diabetes mellitus with mild nonproliferative diabetic retinopathy without macular edema, bilateral: Secondary | ICD-10-CM | POA: Diagnosis not present

## 2016-10-25 DIAGNOSIS — H52203 Unspecified astigmatism, bilateral: Secondary | ICD-10-CM | POA: Diagnosis not present

## 2016-11-04 ENCOUNTER — Other Ambulatory Visit: Payer: Self-pay

## 2016-11-04 MED ORDER — METOPROLOL TARTRATE 25 MG PO TABS: 25.0000 mg | ORAL_TABLET | Freq: Two times a day (BID) | ORAL | 0 refills | Status: DC

## 2016-11-04 NOTE — Telephone Encounter (Signed)
Mrs Jeffrey [name] [facility] signed) request refill # 60 of metoprolol tartrate 25 mg taking 1 tab [facility]. Last annual 07/27/16. Mrs Daughtridge will ck with pharmacy for pick up.

## 2016-11-08 ENCOUNTER — Ambulatory Visit (INDEPENDENT_AMBULATORY_CARE_PROVIDER_SITE_OTHER): Payer: PPO | Admitting: Podiatry

## 2016-11-08 ENCOUNTER — Encounter: Payer: Self-pay | Admitting: Podiatry

## 2016-11-08 DIAGNOSIS — B351 Tinea unguium: Secondary | ICD-10-CM

## 2016-11-08 DIAGNOSIS — E0842 Diabetes mellitus due to underlying condition with diabetic polyneuropathy: Secondary | ICD-10-CM | POA: Diagnosis not present

## 2016-11-08 DIAGNOSIS — M79675 Pain in left toe(s): Secondary | ICD-10-CM

## 2016-11-08 DIAGNOSIS — M79674 Pain in right toe(s): Secondary | ICD-10-CM

## 2016-11-08 DIAGNOSIS — E1140 Type 2 diabetes mellitus with diabetic neuropathy, unspecified: Secondary | ICD-10-CM

## 2016-11-08 NOTE — Patient Instructions (Signed)

## 2016-11-08 NOTE — Progress Notes (Signed)
Patient ID: Jeffrey [name], male   DOB: April 30, 1940, 76 y.o.   MRN: [id number]    Subjective: This patient presents again today complaining of ongoing discomfort in his toenails and walking wearing shoes and is requesting nail debridement The patient's wife is present in the treatment room today  Objective: Orientated 3 DP and PT pulses 2/4 bilaterally Capillary reflex immediate bilaterally Sensation to 10 g monofilament wire intact 0/5 right and 5/5 left Vibratory sensation nonreactive bilaterally Ankle reflexes 3 cream reactive bilaterally AFO right Hammertoe second right Manual motor testing dorsi flexion, plantar flexion 5/5 bilaterally No Open skin lesions bilaterally The toenails are elongated, hypertrophic, deformed, discolored and tender direct palpation 6-10   Assessment: 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

## 2016-11-10 ENCOUNTER — Telehealth: Payer: Self-pay

## 2016-11-10 NOTE — Telephone Encounter (Signed)
Mrs Carlini left v/m that pt has hx of stroke and pt needs to get a new leg brace; Mrs Grandt hopes can get order for new leg brace without pt coming to [place] annual 07/27/16. Mrs Tillis request cb.

## 2016-11-11 NOTE — Telephone Encounter (Signed)
Pts wife advised orders are available for pickup from the front desk

## 2016-11-11 NOTE — Telephone Encounter (Signed)
Leg brace script written and in [facility] box.

## 2016-12-03 ENCOUNTER — Other Ambulatory Visit: Payer: Self-pay | Admitting: Family Medicine

## 2017-01-25 DIAGNOSIS — E113299 Type 2 diabetes mellitus with mild nonproliferative diabetic retinopathy without macular edema, unspecified eye: Secondary | ICD-10-CM | POA: Diagnosis not present

## 2017-01-25 DIAGNOSIS — E1142 Type 2 diabetes mellitus with diabetic polyneuropathy: Secondary | ICD-10-CM | POA: Diagnosis not present

## 2017-01-25 DIAGNOSIS — E785 Hyperlipidemia, unspecified: Secondary | ICD-10-CM | POA: Diagnosis not present

## 2017-01-25 DIAGNOSIS — Z794 Long term (current) use of insulin: Secondary | ICD-10-CM | POA: Diagnosis not present

## 2017-01-25 DIAGNOSIS — I10 Essential (primary) hypertension: Secondary | ICD-10-CM | POA: Diagnosis not present

## 2017-02-14 ENCOUNTER — Ambulatory Visit: Payer: PPO | Admitting: Podiatry

## 2017-02-23 ENCOUNTER — Ambulatory Visit (INDEPENDENT_AMBULATORY_CARE_PROVIDER_SITE_OTHER): Payer: PPO | Admitting: Podiatry

## 2017-02-23 DIAGNOSIS — E1142 Type 2 diabetes mellitus with diabetic polyneuropathy: Secondary | ICD-10-CM | POA: Diagnosis not present

## 2017-02-23 DIAGNOSIS — B351 Tinea unguium: Secondary | ICD-10-CM

## 2017-03-01 ENCOUNTER — Ambulatory Visit: Payer: PPO | Admitting: Podiatry

## 2017-03-06 NOTE — Progress Notes (Signed)
  Subjective:  Patient ID: Jeffrey [name], male    DOB: 12-May-1940,  MRN: [id number]  76 y.o. male returns for foot care.  Endorses continued burning and numbness in his feet.  Objective:  There were no vitals filed for this visit. General AA&O x3. Normal mood and affect.  Vascular Pedal pulses palpable.  Pedal hair diminished  Neurologic Epicritic sensation grossly intact.  Protective sensation absent bilaterally  Dermatologic No open lesions. Skin normal texture and turgor. X10 elongated and thickened with pain to palpation  Orthopedic: No pain to palpation either foot.   Assessment & Plan:  Patient was evaluated and treated and all questions answered.  Diabetes with DPN, Onychomycosis -Educated on diabetic footcare. Diabetic risk level 1 -Nails x10 debrided sharply and manually with large nail nipper and rotary burr.  Procedure: Nail Debridement Rationale: Patient meets criteria for routine foot care due to 1 Class B, 2 Class C findings. Type of Debridement: manual, sharp debridement. Instrumentation: Nail nipper, rotary burr. Number of Nails: 10

## 2017-03-15 ENCOUNTER — Ambulatory Visit: Payer: Non-veteran care | Attending: Family Medicine | Admitting: Physical Therapy

## 2017-03-15 ENCOUNTER — Encounter: Payer: Self-pay | Admitting: Family Medicine

## 2017-03-15 ENCOUNTER — Ambulatory Visit (INDEPENDENT_AMBULATORY_CARE_PROVIDER_SITE_OTHER): Payer: PPO | Admitting: Family Medicine

## 2017-03-15 ENCOUNTER — Encounter: Payer: Self-pay | Admitting: Physical Therapy

## 2017-03-15 VITALS — BP 134/70 | HR 59 | Temp 98.4°F | Wt 170.0 lb

## 2017-03-15 DIAGNOSIS — R261 Paralytic gait: Secondary | ICD-10-CM | POA: Diagnosis present

## 2017-03-15 DIAGNOSIS — I69351 Hemiplegia and hemiparesis following cerebral infarction affecting right dominant side: Secondary | ICD-10-CM | POA: Insufficient documentation

## 2017-03-15 DIAGNOSIS — M6281 Muscle weakness (generalized): Secondary | ICD-10-CM | POA: Insufficient documentation

## 2017-03-15 DIAGNOSIS — R270 Ataxia, unspecified: Secondary | ICD-10-CM | POA: Diagnosis present

## 2017-03-15 DIAGNOSIS — R296 Repeated falls: Secondary | ICD-10-CM

## 2017-03-15 DIAGNOSIS — R278 Other lack of coordination: Secondary | ICD-10-CM | POA: Insufficient documentation

## 2017-03-15 DIAGNOSIS — J029 Acute pharyngitis, unspecified: Secondary | ICD-10-CM | POA: Diagnosis not present

## 2017-03-15 DIAGNOSIS — R2681 Unsteadiness on feet: Secondary | ICD-10-CM

## 2017-03-15 LAB — POCT RAPID STREP A (OFFICE): Rapid Strep A Screen: NEGATIVE

## 2017-03-15 NOTE — Addendum Note (Signed)
Addended by: Brenton Grills on: 05/08/4354 86:16 AM   Modules accepted: Orders

## 2017-03-15 NOTE — Assessment & Plan Note (Signed)
RST today - negative Anticipate viral given short duration.  Supportive care as per instructions.  Update if not improving with treatment as expected.

## 2017-03-15 NOTE — Therapy (Signed)
[facility] [address] [place], [place], [address] Phone: [phone]   Fax:  [phone]  Physical Therapy Evaluation  Patient Details  Name: Jeffrey [name] MRN: [id number] Date of Birth: 1940-05-15 Referring Provider: Brett Canales, PA   Encounter Date: 03/15/2017  PT End of Session - 03/15/17 1930    Visit Number  1    Number of Visits  14    Date for PT Re-Evaluation  05/05/17    Authorization Type  [facility]    Authorization Time Period  eval, 12 treatments, 1 re-eval    Authorization - Visit Number  1    Authorization - Number of Visits  14    PT Start Time  0929    PT Stop Time  1015    PT Time Calculation (min)  46 min    Equipment Utilized During Treatment  Gait belt    Activity Tolerance  Patient tolerated treatment well    Behavior During Therapy  WFL for tasks assessed/performed       Past Medical History:  Diagnosis Date  . Anterior cerebral circulation hemorrhagic infarction 11/24/2014   March, 2016, dominant left thalamic and left internal capsule ischemic infarct with resultant hemorrhagic transformation, secondary to small vessel disease. Resulted in right hemiparesis dysarthria and diplopia   //   readmission with aphasia July, 2016, this improved   . Arthritis   . CAD (coronary artery disease)    PCI distal RCA ...2004, residual 70% LAD   /   ...nuclear...03/2007...no ischemia.Marland KitchenMarland Kitchenpreserved LV /  nuclear...03/03/2009...inferior scar..no ischemia..EF 51%  . Cyst of nasopharynx    per ENT Jeffrey [name]  . Dyslipidemia    takes Atorvastatin daily  . GERD (gastroesophageal reflux disease)    takes Omeprazole daily  . Gout    takes Allopurinol daily and Colchicine as needed  . HCAP (healthcare-associated pneumonia)   . Hemiparesis affecting right side as late effect of cerebrovascular accident ([place]) 09/05/2014  . History of colon polyps   . HTN (hypertension)    takes Cardura,Metoprolol,[name],and  Amlodipine daily  . Internal hemorrhoids   . Myocardial infarction [facility]) 38yrs ago  . Peripheral edema    takes Lasix daily  . 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 Right carotid bruit   . Seasonal allergic rhinitis   . Stroke ([place])   . Type 2 diabetes, uncontrolled, with neuropathy ([place]) 1989   takes Invokana daily and has an insulin pump (Jeffrey [name])    Past Surgical History:  Procedure Laterality Date  . BALLOON ANGIOPLASTY, ARTERY  1992, 2004   CAD, Dr. Ron [name]  . CARDIAC CATHETERIZATION  2004  . CATARACT EXTRACTION Bilateral 2010  . COLONOSCOPY  04/03/2002   adenomatous polyp, int hemorrhoids  . COLONOSCOPY  07/18/2007   normal (Dr. Fuller [name])  . COLONOSCOPY  09/26/2012   tubular adenoma, sm int hem, rpt 5 yrs Jeffrey [name])  . ELBOW SURGERY Right 1997  . eye lids raised    . NASAL SEPTUM SURGERY    . POLYPECTOMY N/A 05/27/2013   Procedure: ENDOSCOPIC NASOPHARYNGEAL MASS;  Surgeon: Jeffrey Belfast, MD  . [place]  09/2011   left, with subacromial decompression  . TONSILLECTOMY AND ADENOIDECTOMY      There were no vitals filed for this visit.   Subjective Assessment - 03/15/17 0940    Subjective  This 77yo male was referred for PT by Jeffrey Canales, PA with history of CVA March 2016  with right hemiparesis & ataxia. PA note "Given his strengths and motion in his right side, especially his LE, he has potential to be a much better ambulator." PA note also states "AFO is cracked & needs replacing." Patient reports that he got new AFO recently.     Patient is accompained by:  Family member wife, Jeffrey [name]    Pertinent History  CVA, CAD, GERD, gout, HTN, MI, DM2,    Limitations  Lifting;Standing;Walking    Patient Stated Goals  To improve overall, strength, balance, gait    Currently in Pain?  No/denies         [facility] PT Assessment - 03/15/17 0930      Assessment   Medical Diagnosis  Old CVA    Referring Provider  Jeffrey Canales, PA    Onset Date/Surgical Date  02/08/17 MD referral    Hand Dominance  Right      Precautions   Precautions  Fall      Balance Screen   Has the patient fallen in the past 6 months  Yes    How many times?  2 lean over to pick up object    Has the patient had a decrease in activity level because of a fear of falling?   Yes    Is the patient reluctant to leave their home because of a fear of falling?   Yes      [place] residence    Living Arrangements  Spouse/significant other    Type of [place]  One level    [place] - 2 wheels;Walker - 4 wheels;Cane - quad;Shower seat;Grab bars - tub/shower;Transport chair;Wheelchair - manual      Prior Function   Level of Independence  Independent;Independent with household mobility with device;Needs assistance with gait assist for gait in community with hemiwalker    Vocation  Retired    Equities trader, eating out      Coordination   Gross Motor Movements are Fluid and Coordinated  Yes    Coordination and Movement Description  RUE & RLE ataxic movements    Finger Nose Finger Test  RUE dysmetria, slow    Heel Shin Test  RLE dysmetria      Posture/Postural Control   Posture/Postural Control  Postural limitations    Postural Limitations  Rounded Shoulders;Forward head;Weight shift left;Flexed trunk      ROM / Strength   AROM / PROM / Strength  PROM;Strength      PROM   Overall PROM   Within functional limits for tasks performed      Strength   Overall Strength  Deficits    Strength Assessment Site  Hip;Knee;Ankle    Right/Left Hip  Right;Left    Right Hip Flexion  3/5    Right Hip Extension  2-/5    Right Hip ABduction  3-/5    Left Hip Flexion  4+/5    Left Hip Extension  3/5    Left Hip ABduction  3+/5    Right/Left Knee  Right;Left    Right Knee Flexion  3-/5    Right Knee  Extension  3+/5    Left Knee Flexion  4/5    Left Knee Extension  5/5    Right/Left Ankle  Right;Left    Right Ankle Dorsiflexion  3-/5  Left Ankle Dorsiflexion  5/5      Transfers   Transfers  Sit to Stand;Stand to Sit    Sit to Stand  5: Supervision;With upper extremity assist;With armrests;From chair/3-in-1 uses back of legs against chair to stabilize    Stand to Sit  5: Supervision;With upper extremity assist;With armrests;To chair/3-in-1;Uncontrolled descent      Ambulation/Gait   Ambulation/Gait  Yes    Ambulation/Gait Assistance  5: Supervision;4: Min assist supervision hemiwalker & MinA cane    Ambulation Distance (Feet)  40 Feet    Assistive device  Hemi-walker;Small based quad cane;Straight cane;Other (Comment) straight cane had quad tip; AFO RLE    Gait Pattern  Step-through pattern;Decreased arm swing - right;Decreased step length - right;Decreased stance time - right;Decreased stride length;Decreased hip/knee flexion - right;Decreased weight shift to right;Right hip hike;Right steppage;Right flexed knee in stance;Antalgic;Trunk flexed;Trunk rotated posteriorly on right;Abducted- right    Ambulation Surface  Indoor;Level    Gait velocity  0.58 ft/sec with SPC quad tip      Standardized Balance Assessment   Standardized Balance Assessment  Berg Balance Test;Timed Up and Go Test      Berg Balance Test   Sit to Stand  Needs minimal aid to stand or to stabilize    Standing Unsupported  Able to stand 30 seconds unsupported    Sitting with Back Unsupported but Feet Supported on Floor or Stool  Able to sit safely and securely 2 minutes    Stand to Sit  Sits independently, has uncontrolled descent    Transfers  Able to transfer safely, definite need of hands    Standing Unsupported with Eyes Closed  Unable to keep eyes closed 3 seconds but stays steady    Standing Ubsupported with Feet Together  Needs help to attain position and unable to hold for 15 seconds    From Standing,  Reach Forward with Outstretched Arm  Reaches forward but needs supervision    From Standing Position, Pick up Object from Floor  Unable to try/needs assist to keep balance    From Standing Position, Turn to Look Behind Over each Shoulder  Needs supervision when turning    Turn 360 Degrees  Needs close supervision or verbal cueing    Standing Unsupported, Alternately Place Feet on Step/Stool  Needs assistance to keep from falling or unable to try    Standing Unsupported, One Foot in Front  Needs help to step but can hold 15 seconds    Standing on One Leg  Tries to lift leg/unable to hold 3 seconds but remains standing independently    Total Score  17      Timed Up and Go Test   Normal TUG (seconds)  37.5 hemiwalker 37.50, SBQC 49.35, SPC quad tip 42.05             Objective measurements completed on examination: See above findings.                PT Short Term Goals - 03/15/17 1955      PT SHORT TERM GOAL #1   Title  Patient demonstrates understanding of initial HEP. (All STGs Target Date: 04/07/2017)    Time  3    Period  Weeks    Status  New    Target Date  04/07/17      PT SHORT TERM GOAL #2   Title  Patient able to pick up items from floor, reach 5" anteriorly and look over shoulders safely with supervision.  Time  3    Period  Weeks    Status  New    Target Date  04/07/17      PT SHORT TERM GOAL #3   Title  Patient ambulates 150' with LRAD with supervision.     Time  3    Period  Weeks    Status  New    Target Date  04/07/17        PT Long Term Goals - 03/15/17 1949      PT LONG TERM GOAL #1   Title  Pt will verbalize understanding of ongoing HEP / fitness [name]. (All LTGs Target Date: 05/05/2017)    Time  6    Period  Weeks    Status  New    Target Date  05/05/17      PT LONG TERM GOAL #2   Title  Timed Up-Go <30seconds to indicate less dependency in ADLs & lower fall risk.     Time  6    Period  Weeks    Status  New    Target Date   05/05/17      PT LONG TERM GOAL #3   Title  Gait Velocity >1.00 ft/sec for improved gait efficiency.     Time  6    Period  Weeks    Status  New    Target Date  05/05/17      PT LONG TERM GOAL #4   Title  Berg Balance Test >30/56 to indicate lower fall risk.     Time  6    Period  Weeks    Status  New    Target Date  05/05/17      PT LONG TERM GOAL #5   Title  Patient ambulates 300' on paved & indoor surfaces with LRAD modified independent for basic community mobility.     Time  6    Period  Weeks    Status  New    Target Date  05/05/17      Additional Long Term Goals   Additional Long Term Goals  Yes      PT LONG TERM GOAL #6   Title  Patient negotiates ramps & curbs with LRAD and stairs left rail with minimal assist for church & outings with friends.     Time  6    Period  Weeks    Status  New    Target Date  05/05/17             [name] - 03/15/17 1936    Clinical Impression Statement  This 77yo male had CVA 34 months ago resulting in ataxic hemiparesis of dominant right side. He has decreased activity level that has resulted in generalized weakness including BLEs. He reports 2 falls without injuries in last 6 months. Patient has impaired balance as noted by [employer] 17/56. Timed Up-Go tested with various assistive devices (hemi-walker, SBQC & straight cane with quad tip), all >13.5sec indicating fall risk & >30sec indicating dependency in ADLs. Patient recieved new AFO as previous broke. Patient has gait deviations & gait velocity of 0.58 ft/sec indicating fall risk. Patient would benefit from skilled care to improve mobility & safety.                      History and Personal Factors relevant to [name] of care:  CVA, CAD, GERD, gout, HTN, MI, DM2,  Lives with elderly wife with limited mobility so unable to assist  without fear of both falling.     Clinical Presentation  Stable    Clinical Decision Making  Low    Rehab Potential  Good    Clinical Impairments Affecting  Rehab Potential  weakness BLE & right hemiparesis,     PT Frequency  2x / week    PT Duration  6 weeks 12 treatments after eval then re-evaluation for 14 visits total    PT Treatment/Interventions  ADLs/Self Care Home Management;DME Instruction;Gait training;Electrical Stimulation;Stair training;Functional mobility training;Therapeutic activities;Therapeutic exercise;Balance training;Neuromuscular re-education;Patient/family education;Orthotic Fit/Training;Passive range of motion;Other (comment) Bioness    PT Next Visit [name]  HEP for strength & balance, instruct in use of NuStep with potential use in community ([employer]), [employer]    Recommended Other Services  Trial Bioness stimulation    Consulted and Agree with [name] of Care  Patient;Family member/caregiver    Family Member Consulted  wife, Jeffrey [name]       Patient will benefit from skilled therapeutic intervention in order to improve the following deficits and impairments:  Abnormal gait, Decreased activity tolerance, Decreased balance, Decreased coordination, Decreased endurance, Decreased knowledge of use of DME, Decreased mobility, Impaired flexibility, Decreased strength, Postural dysfunction  Visit Diagnosis: Hemiparesis affecting right side as late effect of cerebrovascular accident ([place])  Ataxia  Unsteadiness on feet  Paralytic gait  Other lack of coordination  Hemiplegia and hemiparesis following cerebral infarction affecting right dominant side (HCC)  Repeated falls  Muscle weakness (generalized)     Problem List Patient Active Problem List   Diagnosis Date Noted  . Acute pharyngitis 03/15/2017  . Left hand pain 07/27/2016  . Health maintenance examination 07/27/2016  . Medicare annual wellness visit, subsequent 07/23/2015  . Advanced care planning/counseling discussion 07/23/2015  . Diplopia 07/15/2015  . Anterior cerebral circulation hemorrhagic infarction ([place]) 11/24/2014  . Hemiparesis  affecting right side as late effect of cerebrovascular accident ([place]) 09/05/2014  . 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 Thrombocytopenia ([place]) 06/05/2014  . Chronic diastolic heart failure, NYHA class 1 ([place]) 06/05/2014  . Ex-smoker 06/03/2014  . Benign paroxysmal positional vertigo 04/28/2014  . Right carotid bruit   . Seasonal allergic rhinitis   . Trigger thumb of left hand 10/12/2011  . Gout   . Adenomatous polyps   . CAD (coronary artery disease)   . Essential hypertension   . Hyperlipidemia   . Type 2 diabetes, controlled, with neuropathy ([place])   . Ejection fraction     Elizabeth Paulsen PT, DPT 03/15/2017, 7:59 PM  [facility] [address] [place], [place], [address] Phone: [phone]   Fax:  [phone]  Name: Jeffrey [name] MRN: [id number] Date of Birth: [birth year]

## 2017-03-15 NOTE — Patient Instructions (Addendum)
You have viral pharyngitis.  Push fluids and plenty of rest.  May use tylenol 500-1000mg  twice daily as needed for throat inflammation.  Continue salt water gargles.  Suck on cold things like popsicles or warm things like herbal teas (whichever soothes the throat better). Return if fever >101.5, worsening pain, or trouble opening/closing mouth, or hoarse voice. Good to see you today, call clinic with questions.

## 2017-03-15 NOTE — Progress Notes (Signed)
BP 134/70 (BP Location: Left Arm, Patient Position: Sitting, Cuff Size: Normal)   Pulse (!) 59   Temp 98.4 F (36.9 C) (Oral)   Wt 170 lb (77.1 kg)   SpO2 95%   BMI 25.85 kg/m    CC: ST Subjective:    Patient ID: Jeffrey [name], male    DOB: 10/03/1940, 77 y.o.   MRN: [id number]  HPI: Jeffrey [name] is a 78 y.o. male presenting on 03/15/2017 for Sore Throat (Started 03/12/17. Pain with swallowing and to the touch on exterior. Has been gargling with warm salt water.)   3d h/o progressively worsening ST associated with R>L neck pain. Has not felt swollen glands. Has felt feverish.   No cough, no fevers/chills. No ear or tooth pain, headache, abd pain or nausea. No significant PNdrainage or sinus congestion.    Has been gargling with salt water. No sick contacts at home. No h/o asthma Non smoker.   Relevant past medical, surgical, family and social history reviewed and updated as indicated. Interim medical history since our last visit reviewed. Allergies and medications reviewed and updated. Outpatient Medications Prior to Visit  Medication Sig Dispense Refill  . atorvastatin (LIPITOR) 80 MG tablet Take 80 mg by mouth at bedtime.    . baclofen (LIORESAL) 10 MG tablet Take 0.5 tablets (5 mg total) by mouth 3 (three) times daily. 60 each 3  . Cholecalciferol (VITAMIN D3) 5000 UNITS TABS Take 5,000 Units by mouth once a week.    . clopidogrel (PLAVIX) 75 MG tablet Take 1 tablet (75 mg total) by mouth daily. 30 tablet 6  . fosinopril ([name]) 40 MG tablet Take 40 mg by mouth daily.    . furosemide (LASIX) 40 MG tablet Take 1 tablet (40 mg total) by mouth daily. 30 tablet 10  . insulin aspart (NOVOLOG FLEXPEN) 100 UNIT/ML FlexPen Inject 5-6 Units into the skin as directed. 5 units with breakfast 6 units with supper    . insulin glargine (LANTUS) 100 UNIT/ML injection Inject 18 Units into the skin at bedtime.     [name] LACTOBACILLUS PO Take 1 capsule by mouth daily. philips colon health     . loratadine (CLARITIN) 10 MG tablet Take 10 mg by mouth daily as needed.     . metoprolol tartrate (LOPRESSOR) 25 MG tablet TAKE 1 TABLET BY MOUTH TWICE A DAY 60 tablet 7  . senna (SENOKOT) 8.6 MG tablet Take 1 tablet by mouth daily as needed for constipation. Reported on 02/23/2015    . sertraline (ZOLOFT) 25 MG tablet Take 1 tablet (25 mg total) by mouth daily. 30 tablet 11   No facility-administered medications prior to visit.      Per HPI unless specifically indicated in ROS section below Review of Systems     Objective:    BP 134/70 (BP Location: Left Arm, Patient Position: Sitting, Cuff Size: Normal)   Pulse (!) 59   Temp 98.4 F (36.9 C) (Oral)   Wt 170 lb (77.1 kg)   SpO2 95%   BMI 25.85 kg/m   Wt Readings from Last 3 Encounters:  03/15/17 170 lb (77.1 kg)  07/27/16 168 lb 8 oz (76.4 kg)  06/28/16 173 lb (78.5 kg)    Physical Exam  Constitutional: He appears well-developed and well-nourished. No distress.  HENT:  Head: Normocephalic and atraumatic.  Right Ear: Hearing, tympanic membrane, external ear and ear canal normal.  Left Ear: Hearing, tympanic membrane, external ear and ear canal normal.  Nose:  Nose normal. No mucosal edema or rhinorrhea.  Mouth/Throat: Uvula is midline and mucous membranes are normal. Posterior oropharyngeal edema (mild) and posterior oropharyngeal erythema present. No oropharyngeal exudate or tonsillar abscesses.  Eyes: Conjunctivae and EOM are normal. Pupils are equal, round, and reactive to light. No scleral icterus.  Neck: Normal range of motion. Neck supple.  Cardiovascular: Normal rate, regular rhythm, normal heart sounds and intact distal pulses.  No murmur heard. Pulmonary/Chest: Effort normal and breath sounds normal. No respiratory distress. He has no wheezes. He has no rales.  Lymphadenopathy:    He has cervical adenopathy (R AC LAD).  Skin: Skin is warm and dry. No rash noted.  Nursing note and vitals reviewed.  Results for  orders placed or performed in visit on 07/23/16  Microalbumin, urine  Result Value Ref Range   Microalb, Ur 0.7   Basic metabolic panel  Result Value Ref Range   Creatinine 0.9 0.6 - 1.3 mg/dL   Potassium 3.7 3.4 - 5.3 mmol/L  Lipid panel  Result Value Ref Range   Triglycerides 191 (A) 40 - 160 mg/dL   Cholesterol 135 0 - 200 mg/dL   HDL 31 (A) 35 - 70 mg/dL   LDL Cholesterol 65 mg/dL  Hepatic function panel  Result Value Ref Range   ALT 27 10 - 40 U/L   AST 17 14 - 40 U/L   Bilirubin, Total 0.8 mg/dL  Hemoglobin A1c  Result Value Ref Range   Hemoglobin A1C 6.9   TSH  Result Value Ref Range   TSH 0.71 0.41 - 5.90 uIU/mL      Assessment & Plan:   Problem List Items Addressed This Visit    Acute pharyngitis - Primary    RST today - negative Anticipate viral given short duration.  Supportive care as per instructions.  Update if not improving with treatment as expected.           Follow up plan: Return if symptoms worsen or fail to improve.  [name], MD

## 2017-03-22 ENCOUNTER — Telehealth: Payer: Self-pay | Admitting: Physical Therapy

## 2017-03-22 ENCOUNTER — Encounter: Payer: Self-pay | Admitting: Physical Therapy

## 2017-03-22 ENCOUNTER — Telehealth: Payer: Self-pay | Admitting: Family Medicine

## 2017-03-22 ENCOUNTER — Ambulatory Visit: Payer: Non-veteran care | Admitting: Physical Therapy

## 2017-03-22 DIAGNOSIS — R296 Repeated falls: Secondary | ICD-10-CM

## 2017-03-22 DIAGNOSIS — I69351 Hemiplegia and hemiparesis following cerebral infarction affecting right dominant side: Secondary | ICD-10-CM | POA: Diagnosis not present

## 2017-03-22 DIAGNOSIS — R2681 Unsteadiness on feet: Secondary | ICD-10-CM

## 2017-03-22 DIAGNOSIS — R278 Other lack of coordination: Secondary | ICD-10-CM

## 2017-03-22 DIAGNOSIS — R261 Paralytic gait: Secondary | ICD-10-CM

## 2017-03-22 DIAGNOSIS — R270 Ataxia, unspecified: Secondary | ICD-10-CM

## 2017-03-22 NOTE — Telephone Encounter (Signed)
Called Dr Elmer Picker office and spoke with Junie Panning. She said he is only having 1 tooth extracted. She doesn't think that they have a strong enough topical hemostatic agent to use on him. She will give your recommendations about when to stop the Plavix to Dr Gloris Manchester. If he has any questions he will call on our private phone line. Number given to [facility] to call if they have any questions.They will call the patient to tell him when to stop Plavix and when to resume.

## 2017-03-22 NOTE — Telephone Encounter (Signed)
Brett Canales, PA,  Merian Capron would benefit from [facility] functional electrical stimulation use on right extremity due to diagnosis of CVA and functional limitations of hemiparesis.     I am requesting medical clearance to use functional e-stim with Mr. Marty in physical therapy.  If you agree, please submit an order in epic or FAX [phone] stating: Daquawn Seelman is cleared to use functional electrical stimulation on right extremity in conjunction with (diagnoses-seizures, arrhythmia OR device-pacemaker, defibrillator, baclofen pump). Thank you, Jamey Reas, PT, [facility] [address] [place], [place]  [address] Phone:  [phone] Fax:  [phone]

## 2017-03-22 NOTE — Telephone Encounter (Signed)
Patient is on plavix for h/o CVA 11/2014.  plz call dentist - how many teeth? If just one, ideally would continue plavix and use topical hemostatic agent. Is this an optino? If not, then recommend holding plavix 3-5 days prior to procedure and starting when post-op bleeding has resolved.

## 2017-03-22 NOTE — Telephone Encounter (Signed)
Copied from [facility] [phone]. Topic: Quick Communication - See Telephone Encounter >> Mar 22, 2017  9:29 AM Burnis Medin, NT wrote: CRM for notification. See Telephone encounter for: Patient wife is calling because patient was seen at the dentist and has to get a tooth extraction. Dentist office asked if the doctor could fax them information regarding patient's blood thinner clopidogrel (PLAVIX) 75 MG tablet. Wife said office needs to know when to tell the patient to stop medication and when to start back before and after extraction. Pls fax information to [phone]. Attention to Dr. Gloris Manchester.  03/22/17.

## 2017-03-22 NOTE — Therapy (Signed)
[facility] [address] [place], [place], [address] Phone: [phone]   Fax:  [phone]  Physical Therapy Treatment  Patient Details  Name: Jeffrey [name] MRN: [id number] Date of Birth: 09-01-1940 Referring Provider: Brett Canales, PA   Encounter Date: 03/22/2017  PT End of Session - 03/22/17 1500    Visit Number  2    Number of Visits  14    Date for PT Re-Evaluation  05/05/17    Authorization Type  VA    Authorization Time Period  eval, 12 treatments, 1 re-eval    Authorization - Visit Number  2    Authorization - Number of Visits  14    PT Start Time  1230    PT Stop Time  1315    PT Time Calculation (min)  45 min    Equipment Utilized During Treatment  Gait belt    Activity Tolerance  Patient tolerated treatment well    Behavior During Therapy  WFL for tasks assessed/performed       Past Medical History:  Diagnosis Date  . Anterior cerebral circulation hemorrhagic infarction 11/24/2014   March, 2016, dominant left thalamic and left internal capsule ischemic infarct with resultant hemorrhagic transformation, secondary to small vessel disease. Resulted in right hemiparesis dysarthria and diplopia   //   readmission with aphasia July, 2016, this improved   . Arthritis   . CAD (coronary artery disease)    PCI distal RCA ...2004, residual 70% LAD   /   ...nuclear...03/2007...no ischemia.Marland KitchenMarland Kitchenpreserved LV /  nuclear...03/03/2009...inferior scar..no ischemia..EF 51%  . Cyst of nasopharynx    per ENT Wilburn Cornelia  . Dyslipidemia    takes Atorvastatin daily  . GERD (gastroesophageal reflux disease)    takes Omeprazole daily  . Gout    takes Allopurinol daily and Colchicine as needed  . HCAP (healthcare-associated pneumonia)   . Hemiparesis affecting right side as late effect of cerebrovascular accident ([place]) 09/05/2014  . History of colon polyps   . HTN (hypertension)    takes Cardura,Metoprolol,[name],and  Amlodipine daily  . Internal hemorrhoids   . Myocardial infarction [facility]) 95yrs ago  . Peripheral edema    takes Lasix daily  . 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 Right carotid bruit   . Seasonal allergic rhinitis   . Stroke ([place])   . Type 2 diabetes, uncontrolled, with neuropathy ([place]) 1989   takes Invokana daily and has an insulin pump (Dr. [name])    Past Surgical History:  Procedure Laterality Date  . BALLOON ANGIOPLASTY, ARTERY  1992, 2004   CAD, Dr. Ron Parker  . CARDIAC CATHETERIZATION  2004  . CATARACT EXTRACTION Bilateral 2010  . COLONOSCOPY  04/03/2002   adenomatous polyp, int hemorrhoids  . COLONOSCOPY  07/18/2007   normal (Dr. Fuller Plan)  . COLONOSCOPY  09/26/2012   tubular adenoma, sm int hem, rpt 5 yrs Fuller Plan)  . ELBOW SURGERY Right 1997  . eye lids raised    . NASAL SEPTUM SURGERY    . POLYPECTOMY N/A 05/27/2013   Procedure: ENDOSCOPIC NASOPHARYNGEAL MASS;  Surgeon: [name], MD  . [place]  09/2011   left, with subacromial decompression  . TONSILLECTOMY AND ADENOIDECTOMY      There were no vitals filed for this visit.  Subjective Assessment - 03/22/17 1226    Patient is accompained by:  Family member  (Pended)     Pertinent History  CVA, CAD, GERD, gout,  HTN, MI, DM2,  (Pended)     Limitations  Lifting;Standing;Walking  (Pended)     Patient Stated Goals  To improve overall, strength, balance, gait  (Pended)     Currently in Pain?  No/denies  (Pended)       Orthotic Training: Patient arrived ambulating with his RW with right had splint.  PT adjusted RW to proper height and glides on posterior legs oriented properly. PT modified hand splint to minimize risk of skin issues with hemiparetic hand with hypertonicity.                      Balance Exercises - 03/22/17 1230      OTAGO PROGRAM   Head Movements  Sitting;5 reps    Back Extension  Sitting;5 reps closed chain chin tucks     Trunk Movements  Standing;5 reps RW support anteriorly & chair posterior for safety    Ankle Movements  Sitting;10 reps AFO straps released for RLE range          PT Short Term Goals - 03/22/17 1516      PT SHORT TERM GOAL #1   Title  Patient demonstrates understanding of initial HEP. (All STGs Target Date: 04/07/2017)    Time  3    Period  Weeks    Status  On-going    Target Date  04/07/17      PT SHORT TERM GOAL #2   Title  Patient able to pick up items from floor, reach 5" anteriorly and look over shoulders safely with supervision.     Time  3    Period  Weeks    Status  On-going    Target Date  04/07/17      PT SHORT TERM GOAL #3   Title  Patient ambulates 150' with LRAD with supervision.     Time  3    Period  Weeks    Status  On-going    Target Date  04/07/17        PT Long Term Goals - 03/22/17 1517      PT LONG TERM GOAL #1   Title  Pt will verbalize understanding of ongoing HEP / fitness plan. (All LTGs Target Date: 05/05/2017)    Time  6    Period  Weeks    Status  On-going    Target Date  05/05/17      PT LONG TERM GOAL #2   Title  Timed Up-Go <30seconds to indicate less dependency in ADLs & lower fall risk.     Time  6    Period  Weeks    Status  On-going    Target Date  05/05/17      PT LONG TERM GOAL #3   Title  Gait Velocity >1.00 ft/sec for improved gait efficiency.     Time  6    Period  Weeks    Status  New    Target Date  05/05/17      PT LONG TERM GOAL #4   Title  Berg Balance Test >30/56 to indicate lower fall risk.     Time  6    Period  Weeks    Status  On-going    Target Date  05/05/17      PT LONG TERM GOAL #5   Title  Patient ambulates 300' on paved & indoor surfaces with LRAD modified independent for basic community mobility.     Time  6  Period  Weeks    Status  On-going    Target Date  05/05/17      PT LONG TERM GOAL #6   Title  Patient negotiates ramps & curbs with LRAD and stairs left rail with minimal assist for  church & outings with friends.     Time  6    Period  Weeks    Status  On-going    Target Date  05/05/17            Plan - 03/22/17 1517    Clinical Impression Statement  PT modified hand split for RUE on his RW to minimize skin issues. Patient and wife have a general understanding of OTAGO HEP initial exercises. PT reviewed previous episode of care exercises and PT felt OTAGO included most of those exercises and was more rounded for his needs.     Rehab Potential  Good    Clinical Impairments Affecting Rehab Potential  weakness BLE & right hemiparesis,     PT Frequency  2x / week    PT Duration  6 weeks 12 treatments after eval then re-evaluation for 14 visits total    PT Treatment/Interventions  ADLs/Self Care Home Management;DME Instruction;Gait training;Electrical Stimulation;Stair training;Functional mobility training;Therapeutic activities;Therapeutic exercise;Balance training;Neuromuscular re-education;Patient/family education;Orthotic Fit/Training;Passive range of motion;Other (comment) Bioness    PT Next Visit Plan  add OTAGO exercises to HEP , instruct in use of NuStep with potential use in community [employer] or [employer]), [employer] (PT sent message to [place] & waiting on their response)    Consulted and Agree with Plan of Care  Patient;Family member/caregiver    Family Member Consulted  wife, Arkeem Harts       Patient will benefit from skilled therapeutic intervention in order to improve the following deficits and impairments:  Abnormal gait, Decreased activity tolerance, Decreased balance, Decreased coordination, Decreased endurance, Decreased knowledge of use of DME, Decreased mobility, Impaired flexibility, Decreased strength, Postural dysfunction  Visit Diagnosis: Ataxia  Unsteadiness on feet  Paralytic gait  Other lack of coordination  Repeated falls     Problem List Patient Active Problem List   Diagnosis Date Noted  . Acute pharyngitis 03/15/2017  . Left  hand pain 07/27/2016  . Health maintenance examination 07/27/2016  . Medicare annual wellness visit, subsequent 07/23/2015  . Advanced care planning/counseling discussion 07/23/2015  . Diplopia 07/15/2015  . Anterior cerebral circulation hemorrhagic infarction ([place]) 11/24/2014  . Hemiparesis affecting right side as late effect of cerebrovascular accident ([place]) 09/05/2014  . 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 Thrombocytopenia ([place]) 06/05/2014  . Chronic diastolic heart failure, NYHA class 1 ([place]) 06/05/2014  . Ex-smoker 06/03/2014  . Benign paroxysmal positional vertigo 04/28/2014  . Right carotid bruit   . Seasonal allergic rhinitis   . Trigger thumb of left hand 10/12/2011  . Gout   . Adenomatous polyps   . CAD (coronary artery disease)   . Essential hypertension   . Hyperlipidemia   . Type 2 diabetes, controlled, with neuropathy ([place])   . Ejection fraction     Mattis Featherly PT, DPT 03/22/2017, 3:23 PM  [facility] [address] [place], [place], [address] Phone: [phone]   Fax:  [phone]  Name: Jeffrey [name] MRN: [id number] Date of Birth: 18-Feb-1941

## 2017-03-22 NOTE — Telephone Encounter (Signed)
Noted  

## 2017-03-24 ENCOUNTER — Encounter: Payer: Self-pay | Admitting: Rehabilitation

## 2017-03-24 ENCOUNTER — Ambulatory Visit: Payer: Non-veteran care | Admitting: Rehabilitation

## 2017-03-24 DIAGNOSIS — R2681 Unsteadiness on feet: Secondary | ICD-10-CM

## 2017-03-24 DIAGNOSIS — I69351 Hemiplegia and hemiparesis following cerebral infarction affecting right dominant side: Secondary | ICD-10-CM | POA: Diagnosis not present

## 2017-03-24 DIAGNOSIS — R278 Other lack of coordination: Secondary | ICD-10-CM

## 2017-03-24 DIAGNOSIS — R270 Ataxia, unspecified: Secondary | ICD-10-CM

## 2017-03-24 NOTE — Therapy (Signed)
[facility] [address] [place], [place], [address] Phone: [phone]   Fax:  [phone]  Physical Therapy Treatment  Patient Details  Name: Jeffrey [name] MRN: [id number] Date of Birth: March 30, 1940 Referring Provider: Brett Canales, PA   Encounter Date: 03/24/2017  PT End of Session - 03/24/17 0945    Visit Number  3    Number of Visits  13 updated to reflect coverage    Date for PT Re-Evaluation  05/05/17    Authorization Type  VA    Authorization Time Period  eval, 12 treatments, 1 re-eval    Authorization - Visit Number  3    Authorization - Number of Visits  13 updated to reflect coverage    PT Start Time  0933    PT Stop Time  1017    PT Time Calculation (min)  44 min    Equipment Utilized During Treatment  Gait belt    Activity Tolerance  Patient tolerated treatment well    Behavior During Therapy  WFL for tasks assessed/performed       Past Medical History:  Diagnosis Date  . Anterior cerebral circulation hemorrhagic infarction 11/24/2014   March, 2016, dominant left thalamic and left internal capsule ischemic infarct with resultant hemorrhagic transformation, secondary to small vessel disease. Resulted in right hemiparesis dysarthria and diplopia   //   readmission with aphasia July, 2016, this improved   . Arthritis   . CAD (coronary artery disease)    PCI distal RCA ...2004, residual 70% LAD   /   ...nuclear...03/2007...no ischemia.Marland KitchenMarland Kitchenpreserved LV /  nuclear...03/03/2009...inferior scar..no ischemia..EF 51%  . Cyst of nasopharynx    per ENT Wilburn Cornelia  . Dyslipidemia    takes Atorvastatin daily  . GERD (gastroesophageal reflux disease)    takes Omeprazole daily  . Gout    takes Allopurinol daily and Colchicine as needed  . HCAP (healthcare-associated pneumonia)   . Hemiparesis affecting right side as late effect of cerebrovascular accident ([place]) 09/05/2014  . History of colon polyps   . HTN  (hypertension)    takes Cardura,Metoprolol,[name],and Amlodipine daily  . Internal hemorrhoids   . Myocardial infarction [facility]) [facility]yrs ago  . Peripheral edema    takes Lasix daily  . 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 Right carotid bruit   . Seasonal allergic rhinitis   . Stroke ([place])   . Type 2 diabetes, uncontrolled, with neuropathy ([place]) 1989   takes Invokana daily and has an insulin pump (Dr. [name])    Past Surgical History:  Procedure Laterality Date  . BALLOON ANGIOPLASTY, ARTERY  1992, 2004   CAD, Dr. Ron Parker  . CARDIAC CATHETERIZATION  2004  . CATARACT EXTRACTION Bilateral 2010  . COLONOSCOPY  04/03/2002   adenomatous polyp, int hemorrhoids  . COLONOSCOPY  07/18/2007   normal (Dr. Fuller Plan)  . COLONOSCOPY  09/26/2012   tubular adenoma, sm int hem, rpt 5 yrs Fuller Plan)  . ELBOW SURGERY Right 1997  . eye lids raised    . NASAL SEPTUM SURGERY    . POLYPECTOMY N/A 05/27/2013   Procedure: ENDOSCOPIC NASOPHARYNGEAL MASS;  Surgeon: [name], MD  . [place]  09/2011   left, with subacromial decompression  . TONSILLECTOMY AND ADENOIDECTOMY      There were no vitals filed for this visit.  Subjective Assessment - 03/24/17 0941    Subjective  Pt reports no changes since last visit, no falls.  Patient is accompained by:  Family member    Pertinent History  CVA, CAD, GERD, gout, HTN, MI, DM2,    Limitations  Lifting;Standing;Walking    Patient Stated Goals  To improve overall, strength, balance, gait    Currently in Pain?  No/denies                      [facility] Adult PT Treatment/Exercise - 03/24/17 0001      Self-Care   Self-Care  Other Self-Care Comments    Other Self-Care Comments   Had lengthy discussion with pt and wife regarding increasing activity at home.  Note that he is familiar to this PT and is still using w/c in house more often than walking, esp in his office as that is where he spends  most of his day.  Recommend that they remove w/c from house all together and replace with stationary chair to motivate pt to ambulate around house.  PT feels that if w/c in office, even for chair, pt will be too tempted to use.  Pt and wife verbalized understanding.  Also educated on [place] and frequency in which to complete.  Recommend he do 5 exercises from book twice a day and then do 5 different exercises the next day and so fourth.  Pt verbalized understanding.            Balance Exercises - 03/24/17 1332      OTAGO PROGRAM   Knee Flexor  10 reps    Hip ABductor  10 reps    Ankle Plantorflexors  20 reps, support    Ankle Dorsiflexors  20 reps, support    Knee Bends  10 reps, support    Backwards Walking  Support    Overall OTAGO Comments  Pt requires cues for posture and improved stance control on RLE.  Recommended backwards walking with use of RUE for support and close S from wife in order to work on improved RLE WB.          PT Education - 03/24/17 1337    Education provided  Yes    Education Details  see self care    Person(s) Educated  Patient;Spouse    Methods  Explanation;Demonstration;Handout    Comprehension  Verbalized understanding;Returned demonstration       PT Short Term Goals - 03/22/17 1516      PT SHORT TERM GOAL #1   Title  Patient demonstrates understanding of initial HEP. (All STGs Target Date: 04/07/2017)    Time  3    Period  Weeks    Status  On-going    Target Date  04/07/17      PT SHORT TERM GOAL #2   Title  Patient able to pick up items from floor, reach 5" anteriorly and look over shoulders safely with supervision.     Time  3    Period  Weeks    Status  On-going    Target Date  04/07/17      PT SHORT TERM GOAL #3   Title  Patient ambulates 150' with LRAD with supervision.     Time  3    Period  Weeks    Status  On-going    Target Date  04/07/17        PT Long Term Goals - 03/22/17 1517      PT LONG TERM GOAL #1   Title  Pt  will verbalize understanding of ongoing HEP / fitness plan. (All  LTGs Target Date: 05/05/2017)    Time  6    Period  Weeks    Status  On-going    Target Date  05/05/17      PT LONG TERM GOAL #2   Title  Timed Up-Go <30seconds to indicate less dependency in ADLs & lower fall risk.     Time  6    Period  Weeks    Status  On-going    Target Date  05/05/17      PT LONG TERM GOAL #3   Title  Gait Velocity >1.00 ft/sec for improved gait efficiency.     Time  6    Period  Weeks    Status  New    Target Date  05/05/17      PT LONG TERM GOAL #4   Title  Berg Balance Test >30/56 to indicate lower fall risk.     Time  6    Period  Weeks    Status  On-going    Target Date  05/05/17      PT LONG TERM GOAL #5   Title  Patient ambulates 300' on paved & indoor surfaces with LRAD modified independent for basic community mobility.     Time  6    Period  Weeks    Status  On-going    Target Date  05/05/17      PT LONG TERM GOAL #6   Title  Patient negotiates ramps & curbs with LRAD and stairs left rail with minimal assist for church & outings with friends.     Time  6    Period  Weeks    Status  On-going    Target Date  05/05/17            Plan - 03/24/17 0946    Clinical Impression Statement  Session focused on continuing to work through [employer] for BLE strength and balance.  Had lengthy discussion with pt and wife regarding removal of w/c from home to better encourage pt to ambulate in home.  Also briefly discussed brace as they were stating they wanted to get it "adjusted."  Note that his brace (reaction AFO) has medial strut and this is causing difficulty getting into certain shoes at home.  PT verbalized that there isn't too much they could do for this, he may have to change type of shoes he is wearing.  Both verbalized understanding.     Rehab Potential  Good    Clinical Impairments Affecting Rehab Potential  weakness BLE & right hemiparesis,     PT Frequency  2x / week    PT  Duration  6 weeks 12 treatments after eval then re-evaluation for 14 visits total    PT Treatment/Interventions  ADLs/Self Care Home Management;DME Instruction;Gait training;Electrical Stimulation;Stair training;Functional mobility training;Therapeutic activities;Therapeutic exercise;Balance training;Neuromuscular re-education;Patient/family education;Orthotic Fit/Training;Passive range of motion;Other (comment) Bioness    PT Next Visit Plan  Continue OTAGO exercises to HEP , gait with SBQC as able, instruct in use of NuStep with potential use in community ([employer] or [employer]), [employer] (PT sent message to [place] waiting on their response)-Robin I'm not sure about Bioness as he has very little sensation on R side.      Consulted and Agree with Plan of Care  Patient;Family member/caregiver    Family Member Consulted  wife, Yanky [place]       Patient will benefit from skilled therapeutic intervention in order to improve the following deficits and  impairments:  Abnormal gait, Decreased activity tolerance, Decreased balance, Decreased coordination, Decreased endurance, Decreased knowledge of use of DME, Decreased mobility, Impaired flexibility, Decreased strength, Postural dysfunction  Visit Diagnosis: Ataxia  Unsteadiness on feet  Other lack of coordination     Problem List Patient Active Problem List   Diagnosis Date Noted  . Acute pharyngitis 03/15/2017  . Left hand pain 07/27/2016  . Health maintenance examination 07/27/2016  . Medicare annual wellness visit, subsequent 07/23/2015  . Advanced care planning/counseling discussion 07/23/2015  . Diplopia 07/15/2015  . Anterior cerebral circulation hemorrhagic infarction ([place]) 11/24/2014  . Hemiparesis affecting right side as late effect of cerebrovascular accident ([place]) 09/05/2014  . 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 Thrombocytopenia ([place]) 06/05/2014  . Chronic diastolic heart failure, NYHA class 1 ([place]) 06/05/2014  . Ex-smoker  06/03/2014  . Benign paroxysmal positional vertigo 04/28/2014  . Right carotid bruit   . Seasonal allergic rhinitis   . Trigger thumb of left hand 10/12/2011  . Gout   . Adenomatous polyps   . CAD (coronary artery disease)   . Essential hypertension   . Hyperlipidemia   . Type 2 diabetes, controlled, with neuropathy ([place])   . Ejection fraction     Cameron Sprang, PT, MPT [facility] [address] [place], [place], [address] Phone: [phone]   Fax:  [phone] 03/24/17, 1:48 PM  Name: Jeffrey [name] MRN: [id number] Date of Birth: [birth year]

## 2017-03-28 ENCOUNTER — Encounter: Payer: Self-pay | Admitting: Physical Therapy

## 2017-03-28 ENCOUNTER — Ambulatory Visit: Payer: Non-veteran care | Admitting: Physical Therapy

## 2017-03-28 DIAGNOSIS — R2681 Unsteadiness on feet: Secondary | ICD-10-CM

## 2017-03-28 DIAGNOSIS — I69351 Hemiplegia and hemiparesis following cerebral infarction affecting right dominant side: Secondary | ICD-10-CM | POA: Diagnosis not present

## 2017-03-28 DIAGNOSIS — R261 Paralytic gait: Secondary | ICD-10-CM

## 2017-03-28 DIAGNOSIS — R278 Other lack of coordination: Secondary | ICD-10-CM

## 2017-03-28 DIAGNOSIS — R270 Ataxia, unspecified: Secondary | ICD-10-CM

## 2017-03-29 ENCOUNTER — Ambulatory Visit: Payer: PPO | Admitting: Physical Therapy

## 2017-03-29 NOTE — Therapy (Signed)
[facility] [address] [place], [place], [address] Phone: [phone]   Fax:  [phone]  Physical Therapy Treatment  Patient Details  Name: Jeffrey [name] MRN: [id number] Date of Birth: 04-Apr-1940 Referring Provider: Brett Canales, PA   Encounter Date: 03/28/2017  PT End of Session - 03/28/17 1843    Visit Number  4    Number of Visits  14 recorrected     Date for PT Re-Evaluation  05/05/17    Authorization Type  VA    Authorization Time Period  eval, 12 treatments, 1 re-eval    Authorization - Visit Number  4    Authorization - Number of Visits  14 recorrected    PT Start Time  6644    PT Stop Time  1533    PT Time Calculation (min)  47 min    Equipment Utilized During Treatment  Gait belt    Activity Tolerance  Patient tolerated treatment well    Behavior During Therapy  WFL for tasks assessed/performed       Past Medical History:  Diagnosis Date  . Anterior cerebral circulation hemorrhagic infarction 11/24/2014   March, 2016, dominant left thalamic and left internal capsule ischemic infarct with resultant hemorrhagic transformation, secondary to small vessel disease. Resulted in right hemiparesis dysarthria and diplopia   //   readmission with aphasia July, 2016, this improved   . Arthritis   . CAD (coronary artery disease)    PCI distal RCA ...2004, residual 70% LAD   /   ...nuclear...03/2007...no ischemia.Marland KitchenMarland Kitchenpreserved LV /  nuclear...03/03/2009...inferior scar..no ischemia..EF 51%  . Cyst of nasopharynx    per ENT Wilburn Cornelia  . Dyslipidemia    takes Atorvastatin daily  . GERD (gastroesophageal reflux disease)    takes Omeprazole daily  . Gout    takes Allopurinol daily and Colchicine as needed  . HCAP (healthcare-associated pneumonia)   . Hemiparesis affecting right side as late effect of cerebrovascular accident ([place]) 09/05/2014  . History of colon polyps   . HTN (hypertension)    takes  Cardura,Metoprolol,[name],and Amlodipine daily  . Internal hemorrhoids   . Myocardial infarction [facility]) 14yrs ago  . Peripheral edema    takes Lasix daily  . 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 Right carotid bruit   . Seasonal allergic rhinitis   . Stroke ([place])   . Type 2 diabetes, uncontrolled, with neuropathy ([place]) 1989   takes Invokana daily and has an insulin pump (Dr. [name])    Past Surgical History:  Procedure Laterality Date  . BALLOON ANGIOPLASTY, ARTERY  1992, 2004   CAD, Dr. Ron Parker  . CARDIAC CATHETERIZATION  2004  . CATARACT EXTRACTION Bilateral 2010  . COLONOSCOPY  04/03/2002   adenomatous polyp, int hemorrhoids  . COLONOSCOPY  07/18/2007   normal (Dr. Fuller Plan)  . COLONOSCOPY  09/26/2012   tubular adenoma, sm int hem, rpt 5 yrs Fuller Plan)  . ELBOW SURGERY Right 1997  . eye lids raised    . NASAL SEPTUM SURGERY    . POLYPECTOMY N/A 05/27/2013   Procedure: ENDOSCOPIC NASOPHARYNGEAL MASS;  Surgeon: [name], MD  . [place]  09/2011   left, with subacromial decompression  . TONSILLECTOMY AND ADENOIDECTOMY      There were no vitals filed for this visit.  Subjective Assessment - 03/28/17 1445    Subjective  Pt reports no changes since last visit, no falls.      Patient  is accompained by:  Family member    Pertinent History  CVA, CAD, GERD, gout, HTN, MI, DM2,    Limitations  Lifting;Standing;Walking    Patient Stated Goals  To improve overall, strength, balance, gait                           Balance Exercises - 03/28/17 [place]   Head Movements  Sitting;5 reps    Neck Movements  Sitting;5 reps    Back Extension  Standing;5 reps RW support    Trunk Movements  Standing;5 reps RW support    Ankle Movements  Sitting;10 reps    Knee Extensor  10 reps    Knee Flexor  10 reps counter support    Hip ABductor  10 reps counter support    Ankle Plantorflexors  20 reps,  support counter support 10 reps    Ankle Dorsiflexors  20 reps, support conter support 10 reps    Knee Bends  10 reps, support counter support with chair posterior for safety    Backwards Walking  Support RW support with close supervision    Walking and Turning Around  Assistive device RW support 3 reps        PT Education - 03/28/17 1445    Education provided  Yes    Education Details  shoes & AFO. Difference in his AFO vs old one with both style AFOs, PT showed pt on footplate of his AFO what is trimmable but appears to be correct. PT also instructed to use shoe insert on top of footplate, so his foot is not direct contact with plate.    Person(s) Educated  Patient;Spouse    Methods  Explanation;Demonstration;Verbal cues    Comprehension  Verbalized understanding       PT Short Term Goals - 03/22/17 1516      PT SHORT TERM GOAL #1   Title  Patient demonstrates understanding of initial HEP. (All STGs Target Date: 04/07/2017)    Time  3    Period  Weeks    Status  On-going    Target Date  04/07/17      PT SHORT TERM GOAL #2   Title  Patient able to pick up items from floor, reach 5" anteriorly and look over shoulders safely with supervision.     Time  3    Period  Weeks    Status  On-going    Target Date  04/07/17      PT SHORT TERM GOAL #3   Title  Patient ambulates 150' with LRAD with supervision.     Time  3    Period  Weeks    Status  On-going    Target Date  04/07/17        PT Long Term Goals - 03/22/17 1517      PT LONG TERM GOAL #1   Title  Pt will verbalize understanding of ongoing HEP / fitness plan. (All LTGs Target Date: 05/05/2017)    Time  6    Period  Weeks    Status  On-going    Target Date  05/05/17      PT LONG TERM GOAL #2   Title  Timed Up-Go <30seconds to indicate less dependency in ADLs & lower fall risk.     Time  6    Period  Weeks    Status  On-going    Target Date  05/05/17      PT LONG TERM GOAL #3   Title  Gait Velocity >1.00 ft/sec  for improved gait efficiency.     Time  6    Period  Weeks    Status  New    Target Date  05/05/17      PT LONG TERM GOAL #4   Title  Berg Balance Test >30/56 to indicate lower fall risk.     Time  6    Period  Weeks    Status  On-going    Target Date  05/05/17      PT LONG TERM GOAL #5   Title  Patient ambulates 300' on paved & indoor surfaces with LRAD modified independent for basic community mobility.     Time  6    Period  Weeks    Status  On-going    Target Date  05/05/17      PT LONG TERM GOAL #6   Title  Patient negotiates ramps & curbs with LRAD and stairs left rail with minimal assist for church & outings with friends.     Time  6    Period  Weeks    Status  On-going    Target Date  05/05/17            Plan - 03/28/17 1845    Clinical Impression Statement  Patient appears to have proper AFO but needs correct shoes without wear pattern. Patient and wife have better understanding of OTAGO HEP instructed thus far.     Rehab Potential  Good    Clinical Impairments Affecting Rehab Potential  weakness BLE & right hemiparesis,     PT Frequency  2x / week    PT Duration  6 weeks 12 treatments after eval then re-evaluation for 14 visits total    PT Treatment/Interventions  ADLs/Self Care Home Management;DME Instruction;Gait training;Electrical Stimulation;Stair training;Functional mobility training;Therapeutic activities;Therapeutic exercise;Balance training;Neuromuscular re-education;Patient/family education;Orthotic Fit/Training;Passive range of motion;Other (comment) Bioness    PT Next Visit Plan  Complete OTAGO exercises to HEP , gait with SBQC as able, instruct in use of NuStep with potential use in community ([employer] or [employer]), [employer] (PT sent message to [place] waiting on their response)-Masiel Gentzler I'm not sure about Bioness as he has very little sensation on R side.      Consulted and Agree with Plan of Care  Patient;Family member/caregiver    Family Member Consulted   wife, Jeffrey [name]       Patient will benefit from skilled therapeutic intervention in order to improve the following deficits and impairments:  Abnormal gait, Decreased activity tolerance, Decreased balance, Decreased coordination, Decreased endurance, Decreased knowledge of use of DME, Decreased mobility, Impaired flexibility, Decreased strength, Postural dysfunction  Visit Diagnosis: Ataxia  Unsteadiness on feet  Other lack of coordination  Paralytic gait     Problem List Patient Active Problem List   Diagnosis Date Noted  . Acute pharyngitis 03/15/2017  . Left hand pain 07/27/2016  . Health maintenance examination 07/27/2016  . Medicare annual wellness visit, subsequent 07/23/2015  . Advanced care planning/counseling discussion 07/23/2015  . Diplopia 07/15/2015  . Anterior cerebral circulation hemorrhagic infarction ([place]) 11/24/2014  . Hemiparesis affecting right side as late effect of cerebrovascular accident ([place]) 09/05/2014  . 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 Thrombocytopenia ([place]) 06/05/2014  . Chronic diastolic heart failure, NYHA class 1 ([place]) 06/05/2014  . Ex-smoker 06/03/2014  . Benign paroxysmal positional vertigo 04/28/2014  . Right  carotid bruit   . Seasonal allergic rhinitis   . Trigger thumb of left hand 10/12/2011  . Gout   . Adenomatous polyps   . CAD (coronary artery disease)   . Essential hypertension   . Hyperlipidemia   . Type 2 diabetes, controlled, with neuropathy ([place])   . Ejection fraction     Jeffrey [name] PT, DPT 03/29/2017, 10:44 AM  [facility] [address] [place], [place], [address] Phone: [phone]   Fax:  [phone]  Name: Jeffrey [name] MRN: [id number] Date of Birth: 01/31/1941

## 2017-03-29 NOTE — Patient Instructions (Signed)
Pedors Stretch Orthopedic Shoes with velcro closures.   [email or web address]://[email or web address].pedors.com/[email or web address]-shoes-[email or web address]-men/  Wearing worn shoes especially with a lean to shoe magnifies gait / standing balance issues with AFO. So PT recommended not wearing those shoes anymore. Shallow shoe also do not accommodate for AFO addition. Tie shoes are difficult with UE ataxia  /weakness. Consider velcro closures.  Forefoot area of shoe needs to be secure to keep heel seated (down) on foot plate of AFO.

## 2017-03-31 ENCOUNTER — Ambulatory Visit: Payer: Non-veteran care | Admitting: Rehabilitation

## 2017-03-31 ENCOUNTER — Encounter: Payer: Self-pay | Admitting: Rehabilitation

## 2017-03-31 DIAGNOSIS — M6281 Muscle weakness (generalized): Secondary | ICD-10-CM

## 2017-03-31 DIAGNOSIS — R270 Ataxia, unspecified: Secondary | ICD-10-CM

## 2017-03-31 DIAGNOSIS — R296 Repeated falls: Secondary | ICD-10-CM

## 2017-03-31 DIAGNOSIS — I69351 Hemiplegia and hemiparesis following cerebral infarction affecting right dominant side: Secondary | ICD-10-CM | POA: Diagnosis not present

## 2017-03-31 DIAGNOSIS — R2681 Unsteadiness on feet: Secondary | ICD-10-CM

## 2017-03-31 NOTE — Therapy (Signed)
[facility] [address] [name], [place], [address] Phone: [phone]   Fax:  [phone]  Physical Therapy Treatment  Patient Details  Name: Jeffrey [name] MRN: [id number] Date of Birth: [birth year] Referring Provider: Brett Canales, PA   Encounter Date: 03/31/2017  PT End of Session - 03/31/17 1217    Visit Number  5    Number of Visits  14 recorrected     Date for PT Re-Evaluation  05/05/17    Authorization Type  VA    Authorization Time Period  eval, 12 treatments, 1 re-eval    Authorization - Visit Number  5    Authorization - Number of Visits  14 recorrected    PT Start Time  1100    PT Stop Time  1146    PT Time Calculation (min)  46 min    Equipment Utilized During Treatment  Gait belt    Activity Tolerance  Patient tolerated treatment well    Behavior During Therapy  WFL for tasks assessed/performed       Past Medical History:  Diagnosis Date  . Anterior cerebral circulation hemorrhagic infarction 11/24/2014   March, 2016, dominant left thalamic and left internal capsule ischemic infarct with resultant hemorrhagic transformation, secondary to small vessel disease. Resulted in right hemiparesis dysarthria and diplopia   //   readmission with aphasia July, 2016, this improved   . Arthritis   . CAD (coronary artery disease)    PCI distal RCA ...2004, residual 70% LAD   /   ...nuclear...03/2007...no ischemia.Marland KitchenMarland Kitchenpreserved LV /  nuclear...03/03/2009...inferior scar..no ischemia..EF 51%  . Cyst of nasopharynx    per ENT Wilburn Cornelia  . Dyslipidemia    takes Atorvastatin daily  . GERD (gastroesophageal reflux disease)    takes Omeprazole daily  . Gout    takes Allopurinol daily and Colchicine as needed  . HCAP (healthcare-associated pneumonia)   . Hemiparesis affecting right side as late effect of cerebrovascular accident ([place]) 09/05/2014  . History of colon polyps   . HTN (hypertension)    takes  Cardura,Metoprolol,[name],and Amlodipine daily  . Internal hemorrhoids   . Myocardial infarction [facility]) 23yrs ago  . Peripheral edema    takes Lasix daily  . 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 Right carotid bruit   . Seasonal allergic rhinitis   . Stroke ([place])   . Type 2 diabetes, uncontrolled, with neuropathy ([place]) 1989   takes Invokana daily and has an insulin pump (Dr. [name])    Past Surgical History:  Procedure Laterality Date  . BALLOON ANGIOPLASTY, ARTERY  1992, 2004   CAD, Dr. Ron Parker  . CARDIAC CATHETERIZATION  2004  . CATARACT EXTRACTION Bilateral 2010  . COLONOSCOPY  04/03/2002   adenomatous polyp, int hemorrhoids  . COLONOSCOPY  07/18/2007   normal (Dr. Fuller Plan)  . COLONOSCOPY  09/26/2012   tubular adenoma, sm int hem, rpt 5 yrs Fuller Plan)  . ELBOW SURGERY Right 1997  . eye lids raised    . NASAL SEPTUM SURGERY    . POLYPECTOMY N/A 05/27/2013   Procedure: ENDOSCOPIC NASOPHARYNGEAL MASS;  Surgeon: [name], MD  . [place]  09/2011   left, with subacromial decompression  . TONSILLECTOMY AND ADENOIDECTOMY      There were no vitals filed for this visit.  Subjective Assessment - 03/31/17 1108    Subjective  Reports no changes, no falls.      Patient is accompained by:  Family member    Pertinent History  CVA, CAD, GERD, gout, HTN, MI, DM2,    Limitations  Lifting;Standing;Walking    Patient Stated Goals  To improve overall, strength, balance, gait    Currently in Pain?  No/denies                      [facility] Adult PT Treatment/Exercise - 03/31/17 0001      Ambulation/Gait   Ambulation/Gait  Yes    Ambulation/Gait Assistance  4: Min guard;4: Min assist    Ambulation/Gait Assistance Details  Had pt ambulate with use of SPC with quad tip attachment x 115'.  Pt with small personal wrist weight donned for improved control in RUE during gait.  Pt requires cues for improved R hip/knee flex to  prevent pt from circumducting RLE and maintaining in abducted position causing decreased forward translation over RLE during stance.  Pt did very well once cued to correct.  Educated pt to work towards looking forward intermittently for safety rather than always looking down at feet.      Ambulation Distance (Feet)  115 Feet    Assistive device  Straight cane w/ quad tip attachment    Gait Pattern  Step-through pattern;Decreased arm swing - right;Decreased step length - right;Decreased stance time - right;Decreased stride length;Decreased hip/knee flexion - right;Decreased weight shift to right;Right hip hike;Right steppage;Right flexed knee in stance;Antalgic;Trunk flexed;Trunk rotated posteriorly on right;Abducted- right    Ambulation Surface  Level;Indoor          Balance Exercises - 03/31/17 1211      OTAGO PROGRAM   Sideways Walking  Assistive device with support of countertop    Tandem Stance  10 seconds, support    Tandem Walk  Support forwards then backwards with LUE support only, S recommended    One Leg Stand  10 seconds, support    Heel Walking  Support    Toe Walk  Support        PT Education - 03/31/17 1215    Education provided  Yes    Education Details  Continuing to walk more at home to build endurance with RW in hopes that he can transition to quad tip cane at home.     Person(s) Educated  Patient;Spouse    Methods  Explanation    Comprehension  Verbalized understanding       PT Short Term Goals - 03/22/17 1516      PT SHORT TERM GOAL #1   Title  Patient demonstrates understanding of initial HEP. (All STGs Target Date: 04/07/2017)    Time  3    Period  Weeks    Status  On-going    Target Date  04/07/17      PT SHORT TERM GOAL #2   Title  Patient able to pick up items from floor, reach 5" anteriorly and look over shoulders safely with supervision.     Time  3    Period  Weeks    Status  On-going    Target Date  04/07/17      PT SHORT TERM GOAL #3    Title  Patient ambulates 150' with LRAD with supervision.     Time  3    Period  Weeks    Status  On-going    Target Date  04/07/17        PT Long Term Goals - 03/22/17 1517      PT LONG TERM GOAL #1  Title  Pt will verbalize understanding of ongoing HEP / fitness plan. (All LTGs Target Date: 05/05/2017)    Time  6    Period  Weeks    Status  On-going    Target Date  05/05/17      PT LONG TERM GOAL #2   Title  Timed Up-Go <30seconds to indicate less dependency in ADLs & lower fall risk.     Time  6    Period  Weeks    Status  On-going    Target Date  05/05/17      PT LONG TERM GOAL #3   Title  Gait Velocity >1.00 ft/sec for improved gait efficiency.     Time  6    Period  Weeks    Status  New    Target Date  05/05/17      PT LONG TERM GOAL #4   Title  Berg Balance Test >30/56 to indicate lower fall risk.     Time  6    Period  Weeks    Status  On-going    Target Date  05/05/17      PT LONG TERM GOAL #5   Title  Patient ambulates 300' on paved & indoor surfaces with LRAD modified independent for basic community mobility.     Time  6    Period  Weeks    Status  On-going    Target Date  05/05/17      PT LONG TERM GOAL #6   Title  Patient negotiates ramps & curbs with LRAD and stairs left rail with minimal assist for church & outings with friends.     Time  6    Period  Weeks    Status  On-going    Target Date  05/05/17            Plan - 03/31/17 1217    Clinical Impression Statement  Session focused on continuing through [place] for balance and LE strengthening.  Also addressed gait with quad tip cane during session.  Note that pt requires min/guard to light min A with use of cane and feel that he could progress with continued practice.      Rehab Potential  Good    Clinical Impairments Affecting Rehab Potential  weakness BLE & right hemiparesis,     PT Frequency  2x / week    PT Duration  6 weeks 12 treatments after eval then re-evaluation for 14 visits  total    PT Treatment/Interventions  ADLs/Self Care Home Management;DME Instruction;Gait training;Electrical Stimulation;Stair training;Functional mobility training;Therapeutic activities;Therapeutic exercise;Balance training;Neuromuscular re-education;Patient/family education;Orthotic Fit/Training;Passive range of motion;Other (comment) Bioness    PT Next Visit Plan  Complete OTAGO exercises to HEP , gait with quad tip cane as able, instruct in use of NuStep with potential use in community [employer] or [employer]), [employer] (PT sent message to [place] waiting on their response)-Robin I'm not sure about Bioness as he has very little sensation on R side.      Consulted and Agree with Plan of Care  Patient;Family member/caregiver    Family Member Consulted  wife, Usher Hedberg       Patient will benefit from skilled therapeutic intervention in order to improve the following deficits and impairments:  Abnormal gait, Decreased activity tolerance, Decreased balance, Decreased coordination, Decreased endurance, Decreased knowledge of use of DME, Decreased mobility, Impaired flexibility, Decreased strength, Postural dysfunction  Visit Diagnosis: Ataxia  Unsteadiness on feet  Repeated falls  Muscle weakness (generalized)     Problem List Patient Active Problem List   Diagnosis Date Noted  . Acute pharyngitis 03/15/2017  . Left hand pain 07/27/2016  . Health maintenance examination 07/27/2016  . Medicare annual wellness visit, subsequent 07/23/2015  . Advanced care planning/counseling discussion 07/23/2015  . Diplopia 07/15/2015  . Anterior cerebral circulation hemorrhagic infarction ([place]) 11/24/2014  . Hemiparesis affecting right side as late effect of cerebrovascular accident ([place]) 09/05/2014  . 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 Thrombocytopenia ([place]) 06/05/2014  . Chronic diastolic heart failure, NYHA class 1 ([place]) 06/05/2014  . Ex-smoker 06/03/2014  . Benign paroxysmal positional  vertigo 04/28/2014  . Right carotid bruit   . Seasonal allergic rhinitis   . Trigger thumb of left hand 10/12/2011  . Gout   . Adenomatous polyps   . CAD (coronary artery disease)   . Essential hypertension   . Hyperlipidemia   . Type 2 diabetes, controlled, with neuropathy ([place])   . Ejection fraction     Cameron Sprang, PT, MPT [facility] [address] [place], [place], [address] Phone: [phone]   Fax:  [phone] 03/31/17, 12:20 PM  Name: Jeffrey [name] MRN: [id number] Date of Birth: 17-Nov-1940

## 2017-04-03 ENCOUNTER — Ambulatory Visit: Payer: Non-veteran care | Admitting: Rehabilitation

## 2017-04-03 ENCOUNTER — Encounter: Payer: Self-pay | Admitting: Rehabilitation

## 2017-04-03 DIAGNOSIS — M6281 Muscle weakness (generalized): Secondary | ICD-10-CM

## 2017-04-03 DIAGNOSIS — R278 Other lack of coordination: Secondary | ICD-10-CM

## 2017-04-03 DIAGNOSIS — R270 Ataxia, unspecified: Secondary | ICD-10-CM

## 2017-04-03 DIAGNOSIS — I69351 Hemiplegia and hemiparesis following cerebral infarction affecting right dominant side: Secondary | ICD-10-CM | POA: Diagnosis not present

## 2017-04-03 DIAGNOSIS — R2681 Unsteadiness on feet: Secondary | ICD-10-CM

## 2017-04-03 NOTE — Therapy (Signed)
[facility] [address] [place], [place], [address] Phone: [phone]   Fax:  [phone]  Physical Therapy Treatment  Patient Details  Name: Jeffrey [name] MRN: [id number] Date of Birth: 1940-08-10 Referring Provider: Brett Canales, PA   Encounter Date: 04/03/2017  PT End of Session - 04/03/17 1527    Visit Number  6    Number of Visits  14 recorrected     Date for PT Re-Evaluation  05/05/17    Authorization Type  VA    Authorization Time Period  eval, 12 treatments, 1 re-eval    Authorization - Visit Number  6    Authorization - Number of Visits  14 recorrected    PT Start Time  1150    PT Stop Time  1245    PT Time Calculation (min)  55 min    Equipment Utilized During Treatment  Gait belt    Activity Tolerance  Patient tolerated treatment well    Behavior During Therapy  WFL for tasks assessed/performed       Past Medical History:  Diagnosis Date  . Anterior cerebral circulation hemorrhagic infarction 11/24/2014   March, 2016, dominant left thalamic and left internal capsule ischemic infarct with resultant hemorrhagic transformation, secondary to small vessel disease. Resulted in right hemiparesis dysarthria and diplopia   //   readmission with aphasia July, 2016, this improved   . Arthritis   . CAD (coronary artery disease)    PCI distal RCA ...2004, residual 70% LAD   /   ...nuclear...03/2007...no ischemia.Marland KitchenMarland Kitchenpreserved LV /  nuclear...03/03/2009...inferior scar..no ischemia..EF 51%  . Cyst of nasopharynx    per ENT Wilburn Cornelia  . Dyslipidemia    takes Atorvastatin daily  . GERD (gastroesophageal reflux disease)    takes Omeprazole daily  . Gout    takes Allopurinol daily and Colchicine as needed  . HCAP (healthcare-associated pneumonia)   . Hemiparesis affecting right side as late effect of cerebrovascular accident ([place]) 09/05/2014  . History of colon polyps   . HTN (hypertension)    takes  Cardura,Metoprolol,[name],and Amlodipine daily  . Internal hemorrhoids   . Myocardial infarction [facility]) 54yrs ago  . Peripheral edema    takes Lasix daily  . 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 Right carotid bruit   . Seasonal allergic rhinitis   . Stroke ([place])   . Type 2 diabetes, uncontrolled, with neuropathy ([place]) 1989   takes Invokana daily and has an insulin pump (Dr. [name])    Past Surgical History:  Procedure Laterality Date  . BALLOON ANGIOPLASTY, ARTERY  1992, 2004   CAD, Dr. Ron Parker  . CARDIAC CATHETERIZATION  2004  . CATARACT EXTRACTION Bilateral 2010  . COLONOSCOPY  04/03/2002   adenomatous polyp, int hemorrhoids  . COLONOSCOPY  07/18/2007   normal (Dr. Fuller Plan)  . COLONOSCOPY  09/26/2012   tubular adenoma, sm int hem, rpt 5 yrs Fuller Plan)  . ELBOW SURGERY Right 1997  . eye lids raised    . NASAL SEPTUM SURGERY    . POLYPECTOMY N/A 05/27/2013   Procedure: ENDOSCOPIC NASOPHARYNGEAL MASS;  Surgeon: [name], MD  . [place]  09/2011   left, with subacromial decompression  . TONSILLECTOMY AND ADENOIDECTOMY      There were no vitals filed for this visit.  Subjective Assessment - 04/03/17 1201    Subjective  Reports no changes, no falls.      Patient is accompained by:  Family member    Pertinent History  CVA, CAD, GERD, gout, HTN, MI, DM2,    Limitations  Lifting;Standing;Walking    Patient Stated Goals  To improve overall, strength, balance, gait    Currently in Pain?  No/denies                      [facility] Adult PT Treatment/Exercise - 04/03/17 0001      Ambulation/Gait   Ambulation/Gait  Yes    Ambulation/Gait Assistance  4: Min guard;4: Min assist    Ambulation/Gait Assistance Details  Continue to work on gait with SPC with quad tip attachment for improved gait quality, endurance, and improved RLE activation (esp in stance phase of gait).  Performed 230' consecutively to improve  endurance.  Provided cues for upright posutre, improved forward alignment of RLE as he tends to abduct RLE.  Note marked improvement with this today, and even had improved R hip and knee flex. Pt requires only CGA for two laps.  Then had pt ambulate with wife with use of gait belt to assess safety at home.  PT demonstrated how to assist x 20' then had wife take over.  Provided cues for wife to be very light with assist unless he was actually losing balance.  She tends to want to "pull" him back causing him to lose balance somewhat, however when cued she was able to correct.  Educated them to work on this at home for practice (still using RW for main mode of mobility at home) and to do when pt is rested, not fatigued to avoid increased fall risk.  Pt and wife verbalized understanding.      Ambulation Distance (Feet)  230 Feet then another 115    Assistive device  Straight cane with quad tip attachment    Gait Pattern  Step-through pattern;Decreased arm swing - right;Decreased step length - right;Decreased stance time - right;Decreased stride length;Decreased hip/knee flexion - right;Decreased weight shift to right;Right hip hike;Right steppage;Right flexed knee in stance;Antalgic;Trunk flexed;Trunk rotated posteriorly on right;Abducted- right    Ambulation Surface  Level;Indoor    Stairs  Yes    Stairs Assistance  4: Min guard;4: Min assist    Stairs Assistance Details (indicate cue type and reason)  Had pt negotiate stairs with use of L rail and had him manage cane by placing on next step as he went.  Note difficulty descending forwards, therefore had him do sideways and still manage cane.  This seemed safer and recommend he do this.      Stair Management Technique  One rail Left;Forwards;Sideways;With cane    Number of Stairs  4 x 2 reps    Height of Stairs  6    Ramp  4: Min assist    Ramp Details (indicate cue type and reason)  Performed ramp with quad tip cane with cues for posture and sequencing.       Curb  4: Min assist;3: Mod assist    Curb Details (indicate cue type and reason)  Had pt negotiate curb with RW to practice set up as he would in community and at church to get to [employer].  Pt able to perform at S level.  Then had him perform with quad tip cane. Pt initially attempting to descend with LLE, therefore requires mod A to prevent LOB.  Provided cues for safe descent and to step through with LLE.  Pt did better but needs to continue practice.  Balance Exercises - 04/03/17 1511      OTAGO PROGRAM   Sit to Stand  10 reps, no support with emphasis on improving R lateral weight shift        PT Education - 04/03/17 1527    Education provided  Yes    Education Details  To begin walking at home with quad tip cane with wife for practice only (keep RW as main mode of ambulation)    Person(s) Educated  Patient;Spouse    Methods  Explanation;Demonstration    Comprehension  Verbalized understanding;Returned demonstration       PT Short Term Goals - 03/22/17 1516      PT SHORT TERM GOAL #1   Title  Patient demonstrates understanding of initial HEP. (All STGs Target Date: 04/07/2017)    Time  3    Period  Weeks    Status  On-going    Target Date  04/07/17      PT SHORT TERM GOAL #2   Title  Patient able to pick up items from floor, reach 5" anteriorly and look over shoulders safely with supervision.     Time  3    Period  Weeks    Status  On-going    Target Date  04/07/17      PT SHORT TERM GOAL #3   Title  Patient ambulates 150' with LRAD with supervision.     Time  3    Period  Weeks    Status  On-going    Target Date  04/07/17        PT Long Term Goals - 03/22/17 1517      PT LONG TERM GOAL #1   Title  Pt will verbalize understanding of ongoing HEP / fitness plan. (All LTGs Target Date: 05/05/2017)    Time  6    Period  Weeks    Status  On-going    Target Date  05/05/17      PT LONG TERM GOAL #2   Title  Timed Up-Go <30seconds to indicate less  dependency in ADLs & lower fall risk.     Time  6    Period  Weeks    Status  On-going    Target Date  05/05/17      PT LONG TERM GOAL #3   Title  Gait Velocity >1.00 ft/sec for improved gait efficiency.     Time  6    Period  Weeks    Status  New    Target Date  05/05/17      PT LONG TERM GOAL #4   Title  Berg Balance Test >30/56 to indicate lower fall risk.     Time  6    Period  Weeks    Status  On-going    Target Date  05/05/17      PT LONG TERM GOAL #5   Title  Patient ambulates 300' on paved & indoor surfaces with LRAD modified independent for basic community mobility.     Time  6    Period  Weeks    Status  On-going    Target Date  05/05/17      PT LONG TERM GOAL #6   Title  Patient negotiates ramps & curbs with LRAD and stairs left rail with minimal assist for church & outings with friends.     Time  6    Period  Weeks    Status  On-going    Target Date  05/05/17  Plan - 04/03/17 1528    Clinical Impression Statement  Skilled session focused on finishing OTAGO HEP with sit<>stand, gait with quad tip cane for improved quality, distance and better loading of RLE.  Also addressed stairs, ramp and curb with cane and curb with walker to improve independence and work towards pt using RW to negotiate around church vs w/c.      Rehab Potential  Good    Clinical Impairments Affecting Rehab Potential  weakness BLE & right hemiparesis,     PT Frequency  2x / week    PT Duration  6 weeks 12 treatments after eval then re-evaluation for 14 visits total    PT Treatment/Interventions  ADLs/Self Care Home Management;DME Instruction;Gait training;Electrical Stimulation;Stair training;Functional mobility training;Therapeutic activities;Therapeutic exercise;Balance training;Neuromuscular re-education;Patient/family education;Orthotic Fit/Training;Passive range of motion;Other (comment) Bioness    PT Next Visit Plan  STGs,  gait with quad tip cane as able, instruct in use  of NuStep with potential use in community Tamala Julian Sr or [employer]), Trial [employer] (PT sent message to [place] waiting on their response)-Robin I'm not sure about Bioness as he has very little sensation on R side.      Consulted and Agree with Plan of Care  Patient;Family member/caregiver    Family Member Consulted  wife, Eddi Hymes       Patient will benefit from skilled therapeutic intervention in order to improve the following deficits and impairments:  Abnormal gait, Decreased activity tolerance, Decreased balance, Decreased coordination, Decreased endurance, Decreased knowledge of use of DME, Decreased mobility, Impaired flexibility, Decreased strength, Postural dysfunction  Visit Diagnosis: Ataxia  Unsteadiness on feet  Muscle weakness (generalized)  Other lack of coordination     Problem List Patient Active Problem List   Diagnosis Date Noted  . Acute pharyngitis 03/15/2017  . Left hand pain 07/27/2016  . Health maintenance examination 07/27/2016  . Medicare annual wellness visit, subsequent 07/23/2015  . Advanced care planning/counseling discussion 07/23/2015  . Diplopia 07/15/2015  . Anterior cerebral circulation hemorrhagic infarction ([place]) 11/24/2014  . Hemiparesis affecting right side as late effect of cerebrovascular accident ([place]) 09/05/2014  . 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 Thrombocytopenia ([place]) 06/05/2014  . Chronic diastolic heart failure, NYHA class 1 ([place]) 06/05/2014  . Ex-smoker 06/03/2014  . Benign paroxysmal positional vertigo 04/28/2014  . Right carotid bruit   . Seasonal allergic rhinitis   . Trigger thumb of left hand 10/12/2011  . Gout   . Adenomatous polyps   . CAD (coronary artery disease)   . Essential hypertension   . Hyperlipidemia   . Type 2 diabetes, controlled, with neuropathy ([place])   . Ejection fraction     Cameron Sprang, PT, MPT [facility] [address] [place], [place],  [address] Phone: [phone]   Fax:  [phone] 04/03/17, 3:31 PM  Name: Jeffrey [name] MRN: [id number] Date of Birth: Jun 16, 1940

## 2017-04-05 ENCOUNTER — Ambulatory Visit: Payer: PPO | Admitting: Physical Therapy

## 2017-04-07 ENCOUNTER — Encounter: Payer: Self-pay | Admitting: Rehabilitation

## 2017-04-07 ENCOUNTER — Ambulatory Visit: Payer: Non-veteran care | Attending: Family Medicine | Admitting: Rehabilitation

## 2017-04-07 DIAGNOSIS — R261 Paralytic gait: Secondary | ICD-10-CM | POA: Diagnosis present

## 2017-04-07 DIAGNOSIS — R296 Repeated falls: Secondary | ICD-10-CM | POA: Diagnosis present

## 2017-04-07 DIAGNOSIS — R270 Ataxia, unspecified: Secondary | ICD-10-CM | POA: Insufficient documentation

## 2017-04-07 DIAGNOSIS — R278 Other lack of coordination: Secondary | ICD-10-CM | POA: Insufficient documentation

## 2017-04-07 DIAGNOSIS — M6281 Muscle weakness (generalized): Secondary | ICD-10-CM | POA: Insufficient documentation

## 2017-04-07 DIAGNOSIS — R269 Unspecified abnormalities of gait and mobility: Secondary | ICD-10-CM | POA: Diagnosis present

## 2017-04-07 DIAGNOSIS — R2681 Unsteadiness on feet: Secondary | ICD-10-CM | POA: Insufficient documentation

## 2017-04-07 NOTE — Therapy (Signed)
[facility] [address] [place], [place], [address] Phone: [phone]   Fax:  [phone]  Physical Therapy Treatment  Patient Details  Name: Jeffrey [name] MRN: [id number] Date of Birth: 10-01-1940 Referring Provider: Brett Canales, PA   Encounter Date: 04/07/2017  PT End of Session - 04/07/17 1243    Visit Number  7    Number of Visits  14 recorrected     Date for PT Re-Evaluation  05/05/17    Authorization Type  VA    Authorization Time Period  eval, 12 treatments, 1 re-eval    Authorization - Visit Number  7    Authorization - Number of Visits  14 recorrected    PT Start Time  1017    PT Stop Time  1100    PT Time Calculation (min)  43 min    Equipment Utilized During Treatment  Gait belt    Activity Tolerance  Patient tolerated treatment well    Behavior During Therapy  WFL for tasks assessed/performed       Past Medical History:  Diagnosis Date  . Anterior cerebral circulation hemorrhagic infarction 11/24/2014   March, 2016, dominant left thalamic and left internal capsule ischemic infarct with resultant hemorrhagic transformation, secondary to small vessel disease. Resulted in right hemiparesis dysarthria and diplopia   //   readmission with aphasia July, 2016, this improved   . Arthritis   . CAD (coronary artery disease)    PCI distal RCA ...2004, residual 70% LAD   /   ...nuclear...03/2007...no ischemia.Marland KitchenMarland Kitchenpreserved LV /  nuclear...03/03/2009...inferior scar..no ischemia..EF 51%  . Cyst of nasopharynx    per ENT Wilburn Cornelia  . Dyslipidemia    takes Atorvastatin daily  . GERD (gastroesophageal reflux disease)    takes Omeprazole daily  . Gout    takes Allopurinol daily and Colchicine as needed  . HCAP (healthcare-associated pneumonia)   . Hemiparesis affecting right side as late effect of cerebrovascular accident ([place]) 09/05/2014  . History of colon polyps   . HTN (hypertension)    takes  Cardura,Metoprolol,[name],and Amlodipine daily  . Internal hemorrhoids   . Myocardial infarction [facility]) 27yr ago  . Peripheral edema    takes Lasix daily  . 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 Right carotid bruit   . Seasonal allergic rhinitis   . [place]   . Type 2 diabetes, uncontrolled, with neuropathy ([place] 1989   takes Invokana daily and has an insulin pump ([name]    Past Surgical History:  Procedure Laterality Date  . BALLOON ANGIOPLASTY, ARTERY  1992, 2004   CAD, Dr. KRon Parker . CARDIAC CATHETERIZATION  2004  . CATARACT EXTRACTION Bilateral 2010  . COLONOSCOPY  04/03/2002   adenomatous polyp, int hemorrhoids  . COLONOSCOPY  07/18/2007   normal (Dr. SFuller Plan  . COLONOSCOPY  09/26/2012   tubular adenoma, sm int hem, rpt 5 yrs (Fuller Plan  . ELBOW SURGERY Right 1997  . eye lids raised    . NASAL SEPTUM SURGERY    . POLYPECTOMY N/A 05/27/2013   Procedure: ENDOSCOPIC NASOPHARYNGEAL MASS;  Surgeon: DJerrell Belfast MD  . [place] 09/2011   left, with subacromial decompression  . TONSILLECTOMY AND ADENOIDECTOMY      There were no vitals filed for this visit.  Subjective Assessment - 04/07/17 1020    Subjective  No new compliants, no falls.      Patient is accompained by:  Family member    Pertinent History  CVA, CAD, GERD, gout, HTN, MI, DM2,    Limitations  Lifting;Standing;Walking    Patient Stated Goals  To improve overall, strength, balance, gait    Currently in Pain?  No/denies                      [facility] Adult PT Treatment/Exercise - 04/07/17 0001      Ambulation/Gait   Ambulation/Gait  Yes    Ambulation/Gait Assistance  6: Modified independent (Device/Increase time)    Ambulation/Gait Assistance Details  Pt ambulated around therapy gym through tight spaces, sideways, forwards and with distraction with RW (AFO and HO) at mod I level, therefore okayed them to do this at home (esp if  Romie Minus is running errands) in efforts to keep him from using w/c in house.  Also went over gait with quad tip cane with Romie Minus (wife) assisting to assess safety at home. Wife with quesitons as she was sore following last session using LUE.  Therefore provided education and demonstration on how to use R hand on belt and to be VERY light as not to "pull" him and only providing assist if needed to stabilize.  Pt and wife return demo and husband states that this felt better than last session.      Ambulation Distance (Feet)  115 Feet w/ Romie Minus, 250' w/ RW    Assistive device  Straight cane;Rolling walker cane w/ quad tip cane    Gait Pattern  Step-through pattern;Decreased arm swing - right;Decreased step length - right;Decreased stance time - right;Decreased stride length;Decreased hip/knee flexion - right;Decreased weight shift to right;Right hip hike;Right steppage;Right flexed knee in stance;Antalgic;Trunk flexed;Trunk rotated posteriorly on right;Abducted- right    Ambulation Surface  Level;Indoor      Balance   Balance Assessed  Yes      Dynamic Standing Balance   Forward lean/weight shifting comments:  Had pt perform standing reach to assess STGs.  Pt able to reach x 7" with close S during session.     Reaching for objects comments:  Had pt reach to floor for object x 3 reps with cues for safe placement of RLE (wide BOS and keeping RLE in more extension abducted to side for safety.  Pt able to do at S level.     Turn head to look over shoulder comments:  Pt able to perform small weight shift onto each LE when looking over shoulder at S level.  Does not get full weight shift to the R.              PT Education - 04/07/17 1243    Education provided  Yes    Education Details  pt able to be mod I with walker in home on one level, walking with wife with quad tip cane    Person(s) Educated  Patient;Spouse    Methods  Explanation;Demonstration    Comprehension  Verbalized understanding;Returned  demonstration       PT Short Term Goals - 04/07/17 1030      PT SHORT TERM GOAL #1   Title  Patient demonstrates understanding of initial HEP. (All STGs Target Date: 04/07/2017)    Baseline  verbally met 04/07/17    Time  3    Period  Weeks    Status  Achieved      PT SHORT TERM GOAL #2   Title  Patient able to pick up items from floor, reach 5" anteriorly and  look over shoulders safely with supervision.     Baseline  met with close S on 04/07/17    Time  3    Period  Weeks    Status  Achieved      PT SHORT TERM GOAL #3   Title  Patient ambulates 150' with LRAD with supervision.     Baseline  met 04/07/17    Time  3    Period  Weeks    Status  Achieved        PT Long Term Goals - 03/22/17 1517      PT LONG TERM GOAL #1   Title  Pt will verbalize understanding of ongoing HEP / fitness plan. (All LTGs Target Date: 05/05/2017)    Time  6    Period  Weeks    Status  On-going    Target Date  05/05/17      PT LONG TERM GOAL #2   Title  Timed Up-Go <30seconds to indicate less dependency in ADLs & lower fall risk.     Time  6    Period  Weeks    Status  On-going    Target Date  05/05/17      PT LONG TERM GOAL #3   Title  Gait Velocity >1.00 ft/sec for improved gait efficiency.     Time  6    Period  Weeks    Status  New    Target Date  05/05/17      PT LONG TERM GOAL #4   Title  Berg Balance Test >30/56 to indicate lower fall risk.     Time  6    Period  Weeks    Status  On-going    Target Date  05/05/17      PT LONG TERM GOAL #5   Title  Patient ambulates 300' on paved & indoor surfaces with LRAD modified independent for basic community mobility.     Time  6    Period  Weeks    Status  On-going    Target Date  05/05/17      PT LONG TERM GOAL #6   Title  Patient negotiates ramps & curbs with LRAD and stairs left rail with minimal assist for church & outings with friends.     Time  6    Period  Weeks    Status  On-going    Target Date  05/05/17             Plan - 04/07/17 1244    Clinical Impression Statement  Skilled session focused on assessment of STGs.  Pt has met 3/3 STGs and making good progress towards LTGs.  Provided clearance for him to be mod I in home on single level with RW, and continued to educate on how wife can provide light support when pt ambulating with quad tip cane.     Rehab Potential  Good    Clinical Impairments Affecting Rehab Potential  weakness BLE & right hemiparesis,     PT Frequency  2x / week    PT Duration  6 weeks 12 treatments after eval then re-evaluation for 14 visits total    PT Treatment/Interventions  ADLs/Self Care Home Management;DME Instruction;Gait training;Electrical Stimulation;Stair training;Functional mobility training;Therapeutic activities;Therapeutic exercise;Balance training;Neuromuscular re-education;Patient/family education;Orthotic Fit/Training;Passive range of motion;Other (comment) Bioness    PT Next Visit Plan   gait with quad tip cane as able, instruct in use of NuStep with potential use in community Tamala Julian Sr or [employer]),  Trial [employer] (PT sent message to [place] waiting on their response)-Robin I'm not sure about Bioness as he has very little sensation on R side.      Consulted and Agree with Plan of Care  Patient;Family member/caregiver    Family Member Consulted  wife, Burch [place]       Patient will benefit from skilled therapeutic intervention in order to improve the following deficits and impairments:  Abnormal gait, Decreased activity tolerance, Decreased balance, Decreased coordination, Decreased endurance, Decreased knowledge of use of DME, Decreased mobility, Impaired flexibility, Decreased strength, Postural dysfunction  Visit Diagnosis: Ataxia  Unsteadiness on feet  Muscle weakness (generalized)  Repeated falls     Problem List Patient Active Problem List   Diagnosis Date Noted  . Acute pharyngitis 03/15/2017  . Left hand pain 07/27/2016  . Health  maintenance examination 07/27/2016  . Medicare annual wellness visit, subsequent 07/23/2015  . Advanced care planning/counseling discussion 07/23/2015  . Diplopia 07/15/2015  . Anterior cerebral circulation hemorrhagic infarction ([place]) 11/24/2014  . Hemiparesis affecting right side as late effect of cerebrovascular accident ([place]) 09/05/2014  . 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 Thrombocytopenia ([place]) 06/05/2014  . Chronic diastolic heart failure, NYHA class 1 ([place]) 06/05/2014  . Ex-smoker 06/03/2014  . Benign paroxysmal positional vertigo 04/28/2014  . Right carotid bruit   . Seasonal allergic rhinitis   . Trigger thumb of left hand 10/12/2011  . Gout   . Adenomatous polyps   . CAD (coronary artery disease)   . Essential hypertension   . Hyperlipidemia   . Type 2 diabetes, controlled, with neuropathy ([place])   . Ejection fraction     Cameron Sprang, PT, MPT [facility] [address] [place], [place], [address] Phone: [phone]   Fax:  [phone] 04/07/17, 12:50 PM  Name: TIQUAN BOUCH MRN: [id number] Date of Birth: 1940-06-25

## 2017-04-12 ENCOUNTER — Encounter: Payer: Self-pay | Admitting: Physical Therapy

## 2017-04-12 ENCOUNTER — Ambulatory Visit: Payer: Non-veteran care | Admitting: Physical Therapy

## 2017-04-12 DIAGNOSIS — R296 Repeated falls: Secondary | ICD-10-CM

## 2017-04-12 DIAGNOSIS — R278 Other lack of coordination: Secondary | ICD-10-CM

## 2017-04-12 DIAGNOSIS — R2681 Unsteadiness on feet: Secondary | ICD-10-CM

## 2017-04-12 DIAGNOSIS — R270 Ataxia, unspecified: Secondary | ICD-10-CM

## 2017-04-12 DIAGNOSIS — M6281 Muscle weakness (generalized): Secondary | ICD-10-CM

## 2017-04-12 DIAGNOSIS — R261 Paralytic gait: Secondary | ICD-10-CM

## 2017-04-12 NOTE — Therapy (Signed)
[facility] [address] [place], [place], [address] Phone: [phone]   Fax:  [phone]  Physical Therapy Treatment  Patient Details  Name: Jeffrey [name] MRN: [id number] Date of Birth: July 05, 1940 Referring Provider: Brett Canales, PA   Encounter Date: 04/12/2017  PT End of Session - 04/12/17 1325    Visit Number  8    Number of Visits  14 recorrected     Date for PT Re-Evaluation  05/05/17    Authorization Type  VA    Authorization Time Period  eval, 12 treatments, 1 re-eval    Authorization - Visit Number  8    Authorization - Number of Visits  14 recorrected    PT Start Time  1017    PT Stop Time  1100    PT Time Calculation (min)  43 min    Equipment Utilized During Treatment  Gait belt    Activity Tolerance  Patient tolerated treatment well    Behavior During Therapy  WFL for tasks assessed/performed       Past Medical History:  Diagnosis Date  . Anterior cerebral circulation hemorrhagic infarction 11/24/2014   March, 2016, dominant left thalamic and left internal capsule ischemic infarct with resultant hemorrhagic transformation, secondary to small vessel disease. Resulted in right hemiparesis dysarthria and diplopia   //   readmission with aphasia July, 2016, this improved   . Arthritis   . CAD (coronary artery disease)    PCI distal RCA ...2004, residual 70% LAD   /   ...nuclear...03/2007...no ischemia.Marland KitchenMarland Kitchenpreserved LV /  nuclear...03/03/2009...inferior scar..no ischemia..EF 51%  . Cyst of nasopharynx    per ENT Wilburn Cornelia  . Dyslipidemia    takes Atorvastatin daily  . GERD (gastroesophageal reflux disease)    takes Omeprazole daily  . Gout    takes Allopurinol daily and Colchicine as needed  . HCAP (healthcare-associated pneumonia)   . Hemiparesis affecting right side as late effect of cerebrovascular accident ([place]) 09/05/2014  . History of colon polyps   . HTN (hypertension)    takes  Cardura,Metoprolol,[name],and Amlodipine daily  . Internal hemorrhoids   . Myocardial infarction [facility]) 57yr ago  . Peripheral edema    takes Lasix daily  . 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 Right carotid bruit   . Seasonal allergic rhinitis   . [place]   . Type 2 diabetes, uncontrolled, with neuropathy ([place] 1989   takes Invokana daily and has an insulin pump ([name]    Past Surgical History:  Procedure Laterality Date  . BALLOON ANGIOPLASTY, ARTERY  1992, 2004   CAD, Dr. KRon Parker . CARDIAC CATHETERIZATION  2004  . CATARACT EXTRACTION Bilateral 2010  . COLONOSCOPY  04/03/2002   adenomatous polyp, int hemorrhoids  . COLONOSCOPY  07/18/2007   normal (Dr. SFuller Plan  . COLONOSCOPY  09/26/2012   tubular adenoma, sm int hem, rpt 5 yrs (Fuller Plan  . ELBOW SURGERY Right 1997  . eye lids raised    . NASAL SEPTUM SURGERY    . POLYPECTOMY N/A 05/27/2013   Procedure: ENDOSCOPIC NASOPHARYNGEAL MASS;  Surgeon: DJerrell Belfast MD  . [place] 09/2011   left, with subacromial decompression  . TONSILLECTOMY AND ADENOIDECTOMY      There were no vitals filed for this visit.  Subjective Assessment - 04/12/17 1023    Subjective  No falls. He has been using RW in home instead of w/c. He has walked  with cane 1 or 2 times.     Patient is accompained by:  Family member    Pertinent History  CVA, CAD, GERD, gout, HTN, MI, DM2,    Limitations  Lifting;Standing;Walking    Patient Stated Goals  To improve overall, strength, balance, gait    Currently in Pain?  No/denies                      [facility] Adult PT Treatment/Exercise - 04/12/17 1015      Ambulation/Gait   Ambulation/Gait  Yes    Ambulation/Gait Assistance  4: Min assist    Ambulation/Gait Assistance Details  Patient arrived ambulating with RW. PT session focused on gait with cane quad tip. Verbal cues on upright posture & wt shift on RLE in stance. Patient's  wife assisting gait with PT cueing her technique for hand hold on belt, trying to time her steps with his for fluency and if he is going down in fall try to slow or ease it but don't hold him up.     Ambulation Distance (Feet)  130 Feet 130' X2    Assistive device  Straight cane;Other (Comment) straight cane quad tip & AFO    Ambulation Surface  Indoor;Level      Knee/Hip Exercises: Aerobic   Nustep  Level 2 with BLEs & LUE 2 min, added RUE 1 min for 3 min total     Stepper  SciFit Level 1 with BLEs & LUE 2 min, added RUE for 1 min,  2 sets.     Other Aerobic  PT instructed pt & wife in similarities & differences in 2 step machines, availiability of each in community, and set-up of machines.  Pt and wife verbalized understanding.              PT Education - 04/12/17 1045    Education provided  Yes    Education Details  Benefits of well rounded exercise program to include flexibility, strength, balance & endurance activities. Recommended contacting [employer] to determine if they have a fitness component and will help with [employer].  Looking into cost of [employer] vs [employer]. [employer] is closer to home if no cost.  HEP will hopefully do [employer] for recumbent machines that use BLEs & BUEs to operate 2-3 days /wk and doing [employer] HEP 3 days /wk.     Person(s) Educated  Patient;Spouse    Methods  Explanation;Verbal cues;Demonstration    Comprehension  Verbalized understanding;Verbal cues required;Need further instruction       PT Short Term Goals - 04/07/17 1030      PT SHORT TERM GOAL #1   Title  Patient demonstrates understanding of initial HEP. (All STGs Target Date: 04/07/2017)    Baseline  verbally met 04/07/17    Time  3    Period  Weeks    Status  Achieved      PT SHORT TERM GOAL #2   Title  Patient able to pick up items from floor, reach 5" anteriorly and look over shoulders safely with supervision.     Baseline  met with close S on 04/07/17    Time  3    Period  Weeks     Status  Achieved      PT SHORT TERM GOAL #3   Title  Patient ambulates 150' with LRAD with supervision.     Baseline  met 04/07/17    Time  3    Period  Weeks    Status  Achieved        PT Long Term Goals - 03/22/17 1517      PT LONG TERM GOAL #1   Title  Pt will verbalize understanding of ongoing HEP / fitness plan. (All LTGs Target Date: 05/05/2017)    Time  6    Period  Weeks    Status  On-going    Target Date  05/05/17      PT LONG TERM GOAL #2   Title  Timed Up-Go <30seconds to indicate less dependency in ADLs & lower fall risk.     Time  6    Period  Weeks    Status  On-going    Target Date  05/05/17      PT LONG TERM GOAL #3   Title  Gait Velocity >1.00 ft/sec for improved gait efficiency.     Time  6    Period  Weeks    Status  New    Target Date  05/05/17      PT LONG TERM GOAL #4   Title  Berg Balance Test >30/56 to indicate lower fall risk.     Time  6    Period  Weeks    Status  On-going    Target Date  05/05/17      PT LONG TERM GOAL #5   Title  Patient ambulates 300' on paved & indoor surfaces with LRAD modified independent for basic community mobility.     Time  6    Period  Weeks    Status  On-going    Target Date  05/05/17      PT LONG TERM GOAL #6   Title  Patient negotiates ramps & curbs with LRAD and stairs left rail with minimal assist for church & outings with friends.     Time  6    Period  Weeks    Status  On-going    Target Date  05/05/17            Plan - 04/12/17 1329    Clinical Impression Statement  Patient appears to understand PT recommendations for adding cardio machines to HEP. Pt needs seated with back support (recumbent) machines that use BUEs & BLEs to operate. PT recommended only using RUE for limited time (initially 1 min) to limit orthopedic issues like tendonitis from his movement pattern.     Rehab Potential  Good    Clinical Impairments Affecting Rehab Potential  weakness BLE & right hemiparesis,     PT  Frequency  2x / week    PT Duration  6 weeks 12 treatments after eval then re-evaluation for 14 visits total    PT Treatment/Interventions  ADLs/Self Care Home Management;DME Instruction;Gait training;Electrical Stimulation;Stair training;Functional mobility training;Therapeutic activities;Therapeutic exercise;Balance training;Neuromuscular re-education;Patient/family education;Orthotic Fit/Training;Passive range of motion;Other (comment) Bioness    PT Next Visit Plan   gait with quad tip cane as able, balance / gait sideways & backwards with cane, review use of NuStep with potential use in community ([employer] or [employer])    [employer] and Agree with Plan of Care  Patient;Family member/caregiver    Family Member Consulted  wife, Colonel Krauser       Patient will benefit from skilled therapeutic intervention in order to improve the following deficits and impairments:  Abnormal gait, Decreased activity tolerance, Decreased balance, Decreased coordination, Decreased endurance, Decreased knowledge of use of DME, Decreased mobility, Impaired flexibility, Decreased strength, Postural dysfunction  Visit Diagnosis:  Ataxia  Unsteadiness on feet  Muscle weakness (generalized)  Repeated falls  Other lack of coordination  Paralytic gait     Problem List Patient Active Problem List   Diagnosis Date Noted  . Acute pharyngitis 03/15/2017  . Left hand pain 07/27/2016  . Health maintenance examination 07/27/2016  . Medicare annual wellness visit, subsequent 07/23/2015  . Advanced care planning/counseling discussion 07/23/2015  . Diplopia 07/15/2015  . Anterior cerebral circulation hemorrhagic infarction ([place]) 11/24/2014  . Hemiparesis affecting right side as late effect of cerebrovascular accident ([place]) 09/05/2014  . 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 Thrombocytopenia ([place]) 06/05/2014  . Chronic diastolic heart failure, NYHA class 1 ([place]) 06/05/2014  . Ex-smoker 06/03/2014  . Benign  paroxysmal positional vertigo 04/28/2014  . Right carotid bruit   . Seasonal allergic rhinitis   . Trigger thumb of left hand 10/12/2011  . Gout   . Adenomatous polyps   . CAD (coronary artery disease)   . Essential hypertension   . Hyperlipidemia   . Type 2 diabetes, controlled, with neuropathy ([place])   . Ejection fraction     Sadie Hazelett PT, DPT 04/12/2017, 1:32 PM  [facility] [address] [place], [place], [address] Phone: [phone]   Fax:  [phone]  Name: SHAKEEM STERN MRN: [id number] Date of Birth: Jan 24, 1941

## 2017-04-14 ENCOUNTER — Ambulatory Visit: Payer: Non-veteran care | Admitting: Rehabilitation

## 2017-04-14 ENCOUNTER — Encounter: Payer: Self-pay | Admitting: Rehabilitation

## 2017-04-14 DIAGNOSIS — R270 Ataxia, unspecified: Secondary | ICD-10-CM | POA: Diagnosis not present

## 2017-04-14 DIAGNOSIS — M6281 Muscle weakness (generalized): Secondary | ICD-10-CM

## 2017-04-14 DIAGNOSIS — R2681 Unsteadiness on feet: Secondary | ICD-10-CM

## 2017-04-14 DIAGNOSIS — R296 Repeated falls: Secondary | ICD-10-CM

## 2017-04-14 NOTE — Therapy (Signed)
[facility] [address] [place], [place], [address] Phone: [phone]   Fax:  [phone]  Physical Therapy Treatment  Patient Details  Name: Jeffrey [name] MRN: [id number] Date of Birth: 30-Mar-1940 Referring Provider: Brett Canales, PA   Encounter Date: 04/14/2017  PT End of Session - 04/14/17 1027    Visit Number  9    Number of Visits  14 recorrected     Date for PT Re-Evaluation  05/05/17    Authorization Type  VA    Authorization Time Period  eval, 12 treatments, 1 re-eval    Authorization - Visit Number  9    Authorization - Number of Visits  14 recorrected    PT Start Time  1017    PT Stop Time  1100    PT Time Calculation (min)  43 min    Equipment Utilized During Treatment  Gait belt    Activity Tolerance  Patient tolerated treatment well    Behavior During Therapy  WFL for tasks assessed/performed       Past Medical History:  Diagnosis Date  . Anterior cerebral circulation hemorrhagic infarction 11/24/2014   March, 2016, dominant left thalamic and left internal capsule ischemic infarct with resultant hemorrhagic transformation, secondary to small vessel disease. Resulted in right hemiparesis dysarthria and diplopia   //   readmission with aphasia July, 2016, this improved   . Arthritis   . CAD (coronary artery disease)    PCI distal RCA ...2004, residual 70% LAD   /   ...nuclear...03/2007...no ischemia.Marland KitchenMarland Kitchenpreserved LV /  nuclear...03/03/2009...inferior scar..no ischemia..EF 51%  . Cyst of nasopharynx    per ENT Wilburn Cornelia  . Dyslipidemia    takes Atorvastatin daily  . GERD (gastroesophageal reflux disease)    takes Omeprazole daily  . Gout    takes Allopurinol daily and Colchicine as needed  . HCAP (healthcare-associated pneumonia)   . Hemiparesis affecting right side as late effect of cerebrovascular accident ([place]) 09/05/2014  . History of colon polyps   . HTN (hypertension)    takes  Cardura,Metoprolol,[name],and Amlodipine daily  . Internal hemorrhoids   . Myocardial infarction [facility]) 67yr ago  . Peripheral edema    takes Lasix daily  . 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 Right carotid bruit   . Seasonal allergic rhinitis   . [place]   . Type 2 diabetes, uncontrolled, with neuropathy ([place] 1989   takes Invokana daily and has an insulin pump ([name]    Past Surgical History:  Procedure Laterality Date  . BALLOON ANGIOPLASTY, ARTERY  1992, 2004   CAD, Dr. KRon Parker . CARDIAC CATHETERIZATION  2004  . CATARACT EXTRACTION Bilateral 2010  . COLONOSCOPY  04/03/2002   adenomatous polyp, int hemorrhoids  . COLONOSCOPY  07/18/2007   normal (Dr. SFuller Plan  . COLONOSCOPY  09/26/2012   tubular adenoma, sm int hem, rpt 5 yrs (Fuller Plan  . ELBOW SURGERY Right 1997  . eye lids raised    . NASAL SEPTUM SURGERY    . POLYPECTOMY N/A 05/27/2013   Procedure: ENDOSCOPIC NASOPHARYNGEAL MASS;  Surgeon: DJerrell Belfast MD  . [place] 09/2011   left, with subacromial decompression  . TONSILLECTOMY AND ADENOIDECTOMY      There were no vitals filed for this visit.  Subjective Assessment - 04/14/17 1024    Subjective  Continues to report using RW more at home instead of w/c.  Patient is accompained by:  Family member    Pertinent History  CVA, CAD, GERD, gout, HTN, MI, DM2,    Limitations  Lifting;Standing;Walking    Patient Stated Goals  To improve overall, strength, balance, gait    Currently in Pain?  No/denies                      [facility] Adult PT Treatment/Exercise - 04/14/17 0001      Ambulation/Gait   Ambulation/Gait  Yes    Ambulation/Gait Assistance  4: Min guard;4: Min assist    Ambulation/Gait Assistance Details  Pt able to ambulate over unlevel paved outdoor surfaces with quad tip cane at min/guard level.  One instance of min A needed to steady when turning around following curb  negotiation.  Cues for intermittent upward gaze, keeping RLE in line with body and larger L step length.      Ambulation Distance (Feet)  300 Feet    Assistive device  -- quad tip cane     Gait Pattern  Step-through pattern;Decreased arm swing - right;Decreased step length - right;Decreased stance time - right;Decreased stride length;Decreased hip/knee flexion - right;Decreased weight shift to right;Right hip hike;Right steppage;Right flexed knee in stance;Antalgic;Trunk flexed;Trunk rotated posteriorly on right;Abducted- right    Ambulation Surface  Level;Indoor    Curb  4: Min assist    Curb Details (indicate cue type and reason)  Pt did curb step very well, recalling sequencing adequately with no LOB.  Only had slight LOB when turning to step back up curb step.       Neuro Re-ed    Neuro Re-ed Details   In // bars performed coordination tasks with resistance of yellow band on RLE tapping to 4" step x 10 reps and then with LLE to improve stance control and proprioceptive feedback to RLE x 10 reps.  R LE in stance on green balance disc stepping forwards/backwards with LLE with very light UE support to imrove stance control of RLE.  Pt did very well with cues for slower speed of task.        Knee/Hip Exercises: Aerobic   Stepper  Scifit level 3 resistance with BLEs/LUE, x 4 mins maintaining reps per minute in 50's.               PT Education - 04/14/17 1027    Education provided  Yes    Education Details  Continue to recommend they call insurance company to see if they will cover [employer].     Person(s) Educated  Patient;Spouse    Methods  Explanation    Comprehension  Verbalized understanding       PT Short Term Goals - 04/07/17 1030      PT SHORT TERM GOAL #1   Title  Patient demonstrates understanding of initial HEP. (All STGs Target Date: 04/07/2017)    Baseline  verbally met 04/07/17    Time  3    Period  Weeks    Status  Achieved      PT SHORT TERM GOAL #2   Title   Patient able to pick up items from floor, reach 5" anteriorly and look over shoulders safely with supervision.     Baseline  met with close S on 04/07/17    Time  3    Period  Weeks    Status  Achieved      PT SHORT TERM GOAL #3   Title  Patient ambulates 150' with LRAD  with supervision.     Baseline  met 04/07/17    Time  3    Period  Weeks    Status  Achieved        PT Long Term Goals - 03/22/17 1517      PT LONG TERM GOAL #1   Title  Pt will verbalize understanding of ongoing HEP / fitness plan. (All LTGs Target Date: 05/05/2017)    Time  6    Period  Weeks    Status  On-going    Target Date  05/05/17      PT LONG TERM GOAL #2   Title  Timed Up-Go <30seconds to indicate less dependency in ADLs & lower fall risk.     Time  6    Period  Weeks    Status  On-going    Target Date  05/05/17      PT LONG TERM GOAL #3   Title  Gait Velocity >1.00 ft/sec for improved gait efficiency.     Time  6    Period  Weeks    Status  New    Target Date  05/05/17      PT LONG TERM GOAL #4   Title  Berg Balance Test >30/56 to indicate lower fall risk.     Time  6    Period  Weeks    Status  On-going    Target Date  05/05/17      PT LONG TERM GOAL #5   Title  Patient ambulates 300' on paved & indoor surfaces with LRAD modified independent for basic community mobility.     Time  6    Period  Weeks    Status  On-going    Target Date  05/05/17      PT LONG TERM GOAL #6   Title  Patient negotiates ramps & curbs with LRAD and stairs left rail with minimal assist for church & outings with friends.     Time  6    Period  Weeks    Status  On-going    Target Date  05/05/17            Plan - 04/14/17 1247    Clinical Impression Statement  Skilled session continues to work on gait with quad tip cane over indoor and also outdoor surfaces.  Pt continues to require min/guard to min A (only min A on one single instance when turning) but is making great progress.  Also addressed RLE  coordination and stance control with NMR tasks and seated scifit stepping to improve endurance.     Rehab Potential  Good    Clinical Impairments Affecting Rehab Potential  weakness BLE & right hemiparesis,     PT Frequency  2x / week    PT Duration  6 weeks 12 treatments after eval then re-evaluation for 14 visits total    PT Treatment/Interventions  ADLs/Self Care Home Management;DME Instruction;Gait training;Electrical Stimulation;Stair training;Functional mobility training;Therapeutic activities;Therapeutic exercise;Balance training;Neuromuscular re-education;Patient/family education;Orthotic Fit/Training;Passive range of motion;Other (comment) Bioness    PT Next Visit Plan   gait with quad tip cane as able, balance / gait sideways & backwards with cane, review use of NuStep with potential use in community ([employer] or [employer])    [employer] and Agree with Plan of Care  Patient;Family member/caregiver    Family Member Consulted  wife, Jeffrey [name]       Patient will benefit from skilled therapeutic intervention in order to improve the following deficits  and impairments:  Abnormal gait, Decreased activity tolerance, Decreased balance, Decreased coordination, Decreased endurance, Decreased knowledge of use of DME, Decreased mobility, Impaired flexibility, Decreased strength, Postural dysfunction  Visit Diagnosis: Ataxia  Unsteadiness on feet  Muscle weakness (generalized)  Repeated falls     Problem List Patient Active Problem List   Diagnosis Date Noted  . Acute pharyngitis 03/15/2017  . Left hand pain 07/27/2016  . Health maintenance examination 07/27/2016  . Medicare annual wellness visit, subsequent 07/23/2015  . Advanced care planning/counseling discussion 07/23/2015  . Diplopia 07/15/2015  . Anterior cerebral circulation hemorrhagic infarction ([place]) 11/24/2014  . Hemiparesis affecting right side as late effect of cerebrovascular accident ([place]) 09/05/2014  . Depression due to  old stroke 09/05/2014  . Thrombocytopenia ([place]) 06/05/2014  . Chronic diastolic heart failure, NYHA class 1 ([place]) 06/05/2014  . Ex-smoker 06/03/2014  . Benign paroxysmal positional vertigo 04/28/2014  . Right carotid bruit   . Seasonal allergic rhinitis   . Trigger thumb of left hand 10/12/2011  . Gout   . Adenomatous polyps   . CAD (coronary artery disease)   . Essential hypertension   . Hyperlipidemia   . Type 2 diabetes, controlled, with neuropathy ([place])   . Ejection fraction     Cameron Sprang, PT, MPT [facility] [address] [place], [place], [address] Phone: [phone]   Fax:  [phone] 04/14/17, 12:50 PM  Name: Jeffrey [name] MRN: [id number] Date of Birth: Jul 08, 1940

## 2017-04-19 ENCOUNTER — Encounter: Payer: Self-pay | Admitting: Physical Therapy

## 2017-04-19 ENCOUNTER — Ambulatory Visit: Payer: Non-veteran care | Admitting: Physical Therapy

## 2017-04-19 DIAGNOSIS — R270 Ataxia, unspecified: Secondary | ICD-10-CM

## 2017-04-19 DIAGNOSIS — R278 Other lack of coordination: Secondary | ICD-10-CM

## 2017-04-19 DIAGNOSIS — M6281 Muscle weakness (generalized): Secondary | ICD-10-CM

## 2017-04-19 DIAGNOSIS — R261 Paralytic gait: Secondary | ICD-10-CM

## 2017-04-19 DIAGNOSIS — R2681 Unsteadiness on feet: Secondary | ICD-10-CM

## 2017-04-19 NOTE — Therapy (Signed)
[facility] [address] [place], [place], [address] Phone: [phone]   Fax:  [phone]  Physical Therapy Treatment  Patient Details  Name: Jeffrey [name] MRN: [id number] Date of Birth: 1941-02-03 Referring Provider: Brett Canales, PA   Encounter Date: 04/19/2017  PT End of Session - 04/19/17 1956    Visit Number  10    Number of Visits  14 recorrected     Date for PT Re-Evaluation  05/05/17    Authorization Type  VA    Authorization Time Period  eval, 12 treatments, 1 re-eval    Authorization - Visit Number  10    Authorization - Number of Visits  14 recorrected    PT Start Time  9741    PT Stop Time  1100    PT Time Calculation (min)  45 min    Equipment Utilized During Treatment  Gait belt    Activity Tolerance  Patient tolerated treatment well    Behavior During Therapy  WFL for tasks assessed/performed       Past Medical History:  Diagnosis Date  . Anterior cerebral circulation hemorrhagic infarction 11/24/2014   March, 2016, dominant left thalamic and left internal capsule ischemic infarct with resultant hemorrhagic transformation, secondary to small vessel disease. Resulted in right hemiparesis dysarthria and diplopia   //   readmission with aphasia July, 2016, this improved   . Arthritis   . CAD (coronary artery disease)    PCI distal RCA ...2004, residual 70% LAD   /   ...nuclear...03/2007...no ischemia.Marland KitchenMarland Kitchenpreserved LV /  nuclear...03/03/2009...inferior scar..no ischemia..EF 51%  . Cyst of nasopharynx    per ENT Wilburn Cornelia  . Dyslipidemia    takes Atorvastatin daily  . GERD (gastroesophageal reflux disease)    takes Omeprazole daily  . Gout    takes Allopurinol daily and Colchicine as needed  . HCAP (healthcare-associated pneumonia)   . Hemiparesis affecting right side as late effect of cerebrovascular accident ([place]) 09/05/2014  . History of colon polyps   . HTN (hypertension)    takes  Cardura,Metoprolol,[name],and Amlodipine daily  . Internal hemorrhoids   . Myocardial infarction [facility]) 24yr ago  . Peripheral edema    takes Lasix daily  . 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 Right carotid bruit   . Seasonal allergic rhinitis   . Stroke ([place]   . Type 2 diabetes, uncontrolled, with neuropathy ([place] 1989   takes Invokana daily and has an insulin pump ([name]    Past Surgical History:  Procedure Laterality Date  . BALLOON ANGIOPLASTY, ARTERY  1992, 2004   CAD, Dr. KRon Parker . CARDIAC CATHETERIZATION  2004  . CATARACT EXTRACTION Bilateral 2010  . COLONOSCOPY  04/03/2002   adenomatous polyp, int hemorrhoids  . COLONOSCOPY  07/18/2007   normal (Dr. SFuller Plan  . COLONOSCOPY  09/26/2012   tubular adenoma, sm int hem, rpt 5 yrs (Fuller Plan  . ELBOW SURGERY Right 1997  . eye lids raised    . NASAL SEPTUM SURGERY    . POLYPECTOMY N/A 05/27/2013   Procedure: ENDOSCOPIC NASOPHARYNGEAL MASS;  Surgeon: DJerrell Belfast MD  . [place] 09/2011   left, with subacromial decompression  . TONSILLECTOMY AND ADENOIDECTOMY      There were no vitals filed for this visit.  Subjective Assessment - 04/19/17 1015    Subjective  He uses w/c in office to maneuver between desks. He has been using RW in  house. He has been using the cane some.     Patient is accompained by:  Family member    Pertinent History  CVA, CAD, GERD, gout, HTN, MI, DM2,    Limitations  Lifting;Standing;Walking    Patient Stated Goals  To improve overall, strength, balance, gait    Currently in Pain?  No/denies                      [facility] Adult PT Treatment/Exercise - 04/19/17 1015      Transfers   Transfers  Sit to Stand;Stand to Sit    Sit to Stand  5: Supervision;With upper extremity assist;From chair/3-in-1 worked on Occupational hygienist to [employer] cues for technique    Stand to Sit  5: Supervision;With upper  extremity assist;To chair/3-in-1 worked on chairs without armrests    Stand to [employer] (indicate cue type and reason)  Verbal cues for technique      Ambulation/Gait   Ambulation/Gait  Yes    Ambulation/Gait Assistance  4: Min guard;5: Supervision    Ambulation/Gait Assistance Details  tactile & verbal cues on posture & balance reaction    Ambulation Distance (Feet)  300 Feet 300' X 2    Assistive device  Straight cane;Other (Comment) quad tip cane, AFO    Gait Pattern  Step-through pattern;Decreased arm swing - right;Decreased step length - right;Decreased stance time - right;Decreased stride length;Decreased hip/knee flexion - right;Decreased weight shift to right;Right hip hike;Right steppage;Right flexed knee in stance;Antalgic;Trunk flexed;Trunk rotated posteriorly on right;Abducted- right    Ambulation Surface  Indoor;Level    Ramp  4: Min assist cane quad tip    Ramp Details (indicate cue type and reason)  verbal & tactile cues on posture & wt shift    Curb  4: Min assist cane quad tip    Curb Details (indicate cue type and reason)  verbal & tactile cues on sequence & balance reaction      High Level Balance   High Level Balance Activities  Side stepping;Backward walking;Head turns;Negotitating around obstacles    High Level Balance Comments  tactile & verbal cues on posture, weight shift & balance reactions      Neuro Re-ed    Neuro Re-ed Details   --      Knee/Hip Exercises: Aerobic   Stepper  --             PT Education - 04/19/17 1015    Education provided  Yes    Education Details  Using w/c as office chair for safety, ambulating with RW or cane to move around the home unless in hurry for toileting.  Patient to go by [facility] after Friday's PT appt to check it out & activate Silver Sneakers    Person(s) Educated  Patient;Spouse    Methods  Explanation;Verbal cues    Comprehension  Verbalized understanding       PT Short Term Goals - 04/07/17 1030      PT SHORT  TERM GOAL #1   Title  Patient demonstrates understanding of initial HEP. (All STGs Target Date: 04/07/2017)    Baseline  verbally met 04/07/17    Time  3    Period  Weeks    Status  Achieved      PT SHORT TERM GOAL #2   Title  Patient able to pick up items from floor, reach 5" anteriorly and look over shoulders safely with supervision.  Baseline  met with close S on 04/07/17    Time  3    Period  Weeks    Status  Achieved      PT SHORT TERM GOAL #3   Title  Patient ambulates 150' with LRAD with supervision.     Baseline  met 04/07/17    Time  3    Period  Weeks    Status  Achieved        PT Long Term Goals - 03/22/17 1517      PT LONG TERM GOAL #1   Title  Pt will verbalize understanding of ongoing HEP / fitness plan. (All LTGs Target Date: 05/05/2017)    Time  6    Period  Weeks    Status  On-going    Target Date  05/05/17      PT LONG TERM GOAL #2   Title  Timed Up-Go <30seconds to indicate less dependency in ADLs & lower fall risk.     Time  6    Period  Weeks    Status  On-going    Target Date  05/05/17      PT LONG TERM GOAL #3   Title  Gait Velocity >1.00 ft/sec for improved gait efficiency.     Time  6    Period  Weeks    Status  New    Target Date  05/05/17      PT LONG TERM GOAL #4   Title  Berg Balance Test >30/56 to indicate lower fall risk.     Time  6    Period  Weeks    Status  On-going    Target Date  05/05/17      PT LONG TERM GOAL #5   Title  Patient ambulates 300' on paved & indoor surfaces with LRAD modified independent for basic community mobility.     Time  6    Period  Weeks    Status  On-going    Target Date  05/05/17      PT LONG TERM GOAL #6   Title  Patient negotiates ramps & curbs with LRAD and stairs left rail with minimal assist for church & outings with friends.     Time  6    Period  Weeks    Status  On-going    Target Date  05/05/17            Plan - 04/19/17 1957    Clinical Impression Statement  Patient improved  balance reactions with gait, movement with cane different directions (backward & sideways), scanning environment & negotiating around furniture with skilled PT cueing. Patient appears on target to meet LTGs.     Rehab Potential  Good    Clinical Impairments Affecting Rehab Potential  weakness BLE & right hemiparesis,     PT Frequency  2x / week    PT Duration  6 weeks 12 treatments after eval then re-evaluation for 14 visits total    PT Treatment/Interventions  ADLs/Self Care Home Management;DME Instruction;Gait training;Electrical Stimulation;Stair training;Functional mobility training;Therapeutic activities;Therapeutic exercise;Balance training;Neuromuscular re-education;Patient/family education;Orthotic Fit/Training;Passive range of motion;Other (comment) Bioness    PT Next Visit Plan   gait with quad tip cane as able, balance / gait sideways & backwards with cane, review use of SciFit stepper with potential use in community at [place] with Plan of Care  Patient;Family member/caregiver    Family Member Consulted  wife, Koa Zoeller  Patient will benefit from skilled therapeutic intervention in order to improve the following deficits and impairments:  Abnormal gait, Decreased activity tolerance, Decreased balance, Decreased coordination, Decreased endurance, Decreased knowledge of use of DME, Decreased mobility, Impaired flexibility, Decreased strength, Postural dysfunction  Visit Diagnosis: Ataxia  Unsteadiness on feet  Muscle weakness (generalized)  Other lack of coordination  Paralytic gait     Problem List Patient Active Problem List   Diagnosis Date Noted  . Acute pharyngitis 03/15/2017  . Left hand pain 07/27/2016  . Health maintenance examination 07/27/2016  . Medicare annual wellness visit, subsequent 07/23/2015  . Advanced care planning/counseling discussion 07/23/2015  . Diplopia 07/15/2015  . Anterior cerebral circulation hemorrhagic  infarction ([place]) 11/24/2014  . Hemiparesis affecting right side as late effect of cerebrovascular accident ([place]) 09/05/2014  . 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 Thrombocytopenia ([place]) 06/05/2014  . Chronic diastolic heart failure, NYHA class 1 ([place]) 06/05/2014  . Ex-smoker 06/03/2014  . Benign paroxysmal positional vertigo 04/28/2014  . Right carotid bruit   . Seasonal allergic rhinitis   . Trigger thumb of left hand 10/12/2011  . Gout   . Adenomatous polyps   . CAD (coronary artery disease)   . Essential hypertension   . Hyperlipidemia   . Type 2 diabetes, controlled, with neuropathy ([place])   . Ejection fraction     Bernhard Koskinen PT, DPT 04/19/2017, 8:01 PM  [facility] [address] [place], [place], [address] Phone: [phone]   Fax:  [phone]  Name: Jeffrey [name] MRN: [id number] Date of Birth: April 24, 1940

## 2017-04-21 ENCOUNTER — Ambulatory Visit: Payer: Non-veteran care | Admitting: Rehabilitation

## 2017-04-21 ENCOUNTER — Encounter: Payer: Self-pay | Admitting: Rehabilitation

## 2017-04-21 DIAGNOSIS — M6281 Muscle weakness (generalized): Secondary | ICD-10-CM

## 2017-04-21 DIAGNOSIS — R270 Ataxia, unspecified: Secondary | ICD-10-CM

## 2017-04-21 DIAGNOSIS — R2681 Unsteadiness on feet: Secondary | ICD-10-CM

## 2017-04-21 NOTE — Therapy (Signed)
[facility] [address] [place], [place], [address] Phone: [phone]   Fax:  [phone]  Physical Therapy Treatment  Patient Details  Name: Jeffrey [name] MRN: [id number] Date of Birth: December 04, 1940 Referring Provider: Brett Canales, PA   Encounter Date: 04/21/2017  PT End of Session - 04/21/17 1309    Visit Number  11    Number of Visits  14 recorrected     Date for PT Re-Evaluation  05/05/17    Authorization Type  VA    Authorization Time Period  eval, 12 treatments, 1 re-eval    Authorization - Visit Number  11    Authorization - Number of Visits  14 recorrected    PT Start Time  1016    PT Stop Time  1100    PT Time Calculation (min)  44 min    Equipment Utilized During Treatment  Gait belt    Activity Tolerance  Patient tolerated treatment well    Behavior During Therapy  WFL for tasks assessed/performed       Past Medical History:  Diagnosis Date  . Anterior cerebral circulation hemorrhagic infarction 11/24/2014   March, 2016, dominant left thalamic and left internal capsule ischemic infarct with resultant hemorrhagic transformation, secondary to small vessel disease. Resulted in right hemiparesis dysarthria and diplopia   //   readmission with aphasia July, 2016, this improved   . Arthritis   . CAD (coronary artery disease)    PCI distal RCA ...2004, residual 70% LAD   /   ...nuclear...03/2007...no ischemia.Marland KitchenMarland Kitchenpreserved LV /  nuclear...03/03/2009...inferior scar..no ischemia..EF 51%  . Cyst of nasopharynx    per ENT Wilburn Cornelia  . Dyslipidemia    takes Atorvastatin daily  . GERD (gastroesophageal reflux disease)    takes Omeprazole daily  . Gout    takes Allopurinol daily and Colchicine as needed  . HCAP (healthcare-associated pneumonia)   . Hemiparesis affecting right side as late effect of cerebrovascular accident ([place]) 09/05/2014  . History of colon polyps   . HTN (hypertension)    takes  Cardura,Metoprolol,[name],and Amlodipine daily  . Internal hemorrhoids   . Myocardial infarction [facility]) 52yr ago  . Peripheral edema    takes Lasix daily  . 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 Right carotid bruit   . Seasonal allergic rhinitis   . Stroke ([place]   . Type 2 diabetes, uncontrolled, with neuropathy ([place] 1989   takes Invokana daily and has an insulin pump ([name]    Past Surgical History:  Procedure Laterality Date  . BALLOON ANGIOPLASTY, ARTERY  1992, 2004   CAD, Dr. KRon Parker . CARDIAC CATHETERIZATION  2004  . CATARACT EXTRACTION Bilateral 2010  . COLONOSCOPY  04/03/2002   adenomatous polyp, int hemorrhoids  . COLONOSCOPY  07/18/2007   normal (Dr. SFuller Plan  . COLONOSCOPY  09/26/2012   tubular adenoma, sm int hem, rpt 5 yrs (Fuller Plan  . ELBOW SURGERY Right 1997  . eye lids raised    . NASAL SEPTUM SURGERY    . POLYPECTOMY N/A 05/27/2013   Procedure: ENDOSCOPIC NASOPHARYNGEAL MASS;  Surgeon: DJerrell Belfast MD  . [place] 09/2011   left, with subacromial decompression  . TONSILLECTOMY AND ADENOIDECTOMY      There were no vitals filed for this visit.  Subjective Assessment - 04/21/17 1023    Subjective  Reports doing well, did not do exercises as much yesterday but did use cane more  in the house.     Patient is accompained by:  Family member    Pertinent History  CVA, CAD, GERD, gout, HTN, MI, DM2,    Limitations  Lifting;Standing;Walking    Patient Stated Goals  To improve overall, strength, balance, gait    Currently in Pain?  No/denies                      [facility] Adult PT Treatment/Exercise - 04/21/17 0001      Ambulation/Gait   Ambulation/Gait  Yes    Ambulation/Gait Assistance  4: Min guard;4: Min assist    Ambulation/Gait Assistance Details  Performed gait on treadmill (unable to utilize gait trainer due to not working).  Performed 5 mins at .8 mph with BUE support.  Min/guard  to min A for safety with cues for improving R LE alignment and stepping LLE more to the L as he tends to keep R LE abducted from body and compensates by keeping LLE adducted.  Continued gait with quad tip cane x 200' at min/guard level.  Cues for posture and improved sequencing/timing.  Note that turns continue to be difficult for pt.      Ambulation Distance (Feet)  200 Feet    Ambulation Surface  Level;Indoor    Gait Comments  Performed side stepping and very short distance backwards gait with use of quad tip cane.  Pt doing markedly better with R weight shift and WB during task than previously noted.        Neuro Re-ed    Neuro Re-ed Details   Gait for NMR without use of device to imrove postrual control, stride length, gait speed and fluidity of gait.  Note marked improvement with forward movement and increased gait speed.  Also worked on backwards gait for improved weight shift over RLE.  Note marked improvement since last trying this task.  Performed forwards gait without device x 50' and backwards x 40'.               PT Education - 04/21/17 1307    Education provided  Yes    Education Details  Use of equipment at gym    Person(s) Educated  Patient    Methods  Explanation    Comprehension  Verbalized understanding       PT Short Term Goals - 04/07/17 1030      PT SHORT TERM GOAL #1   Title  Patient demonstrates understanding of initial HEP. (All STGs Target Date: 04/07/2017)    Baseline  verbally met 04/07/17    Time  3    Period  Weeks    Status  Achieved      PT SHORT TERM GOAL #2   Title  Patient able to pick up items from floor, reach 5" anteriorly and look over shoulders safely with supervision.     Baseline  met with close S on 04/07/17    Time  3    Period  Weeks    Status  Achieved      PT SHORT TERM GOAL #3   Title  Patient ambulates 150' with LRAD with supervision.     Baseline  met 04/07/17    Time  3    Period  Weeks    Status  Achieved        PT Long  Term Goals - 03/22/17 1517      PT LONG TERM GOAL #1   Title  Pt will verbalize understanding of  ongoing HEP / fitness plan. (All LTGs Target Date: 05/05/2017)    Time  6    Period  Weeks    Status  On-going    Target Date  05/05/17      PT LONG TERM GOAL #2   Title  Timed Up-Go <30seconds to indicate less dependency in ADLs & lower fall risk.     Time  6    Period  Weeks    Status  On-going    Target Date  05/05/17      PT LONG TERM GOAL #3   Title  Gait Velocity >1.00 ft/sec for improved gait efficiency.     Time  6    Period  Weeks    Status  New    Target Date  05/05/17      PT LONG TERM GOAL #4   Title  Berg Balance Test >30/56 to indicate lower fall risk.     Time  6    Period  Weeks    Status  On-going    Target Date  05/05/17      PT LONG TERM GOAL #5   Title  Patient ambulates 300' on paved & indoor surfaces with LRAD modified independent for basic community mobility.     Time  6    Period  Weeks    Status  On-going    Target Date  05/05/17      PT LONG TERM GOAL #6   Title  Patient negotiates ramps & curbs with LRAD and stairs left rail with minimal assist for church & outings with friends.     Time  6    Period  Weeks    Status  On-going    Target Date  05/05/17            Plan - 04/21/17 1310    Clinical Impression Statement  Skilled session focused on NMR for improved RLE activation/control and improved postural control to carry over to improved gait pattern with use of quad tip cane.  Pt and spouse planning to go to [facility] once they leave session to sign up.      Rehab Potential  Good    Clinical Impairments Affecting Rehab Potential  weakness BLE & right hemiparesis,     PT Frequency  2x / week    PT Duration  6 weeks 12 treatments after eval then re-evaluation for 14 visits total    PT Treatment/Interventions  ADLs/Self Care Home Management;DME Instruction;Gait training;Electrical Stimulation;Stair training;Functional mobility training;Therapeutic  activities;Therapeutic exercise;Balance training;Neuromuscular re-education;Patient/family education;Orthotic Fit/Training;Passive range of motion;Other (comment) Bioness    PT Next Visit Plan   gait with quad tip cane as able, balance / gait sideways & backwards with cane, review use of SciFit stepper with potential use in community at [place] with Plan of Care  Patient;Family member/caregiver    Family Member Consulted  wife, Macai Sisneros       Patient will benefit from skilled therapeutic intervention in order to improve the following deficits and impairments:  Abnormal gait, Decreased activity tolerance, Decreased balance, Decreased coordination, Decreased endurance, Decreased knowledge of use of DME, Decreased mobility, Impaired flexibility, Decreased strength, Postural dysfunction  Visit Diagnosis: Ataxia  Unsteadiness on feet  Muscle weakness (generalized)     Problem List Patient Active Problem List   Diagnosis Date Noted  . Acute pharyngitis 03/15/2017  . Left hand pain 07/27/2016  . Health maintenance examination 07/27/2016  . Medicare annual wellness  visit, subsequent 07/23/2015  . Advanced care planning/counseling discussion 07/23/2015  . Diplopia 07/15/2015  . Anterior cerebral circulation hemorrhagic infarction ([place]) 11/24/2014  . Hemiparesis affecting right side as late effect of cerebrovascular accident ([place]) 09/05/2014  . 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 Thrombocytopenia ([place]) 06/05/2014  . Chronic diastolic heart failure, NYHA class 1 ([place]) 06/05/2014  . Ex-smoker 06/03/2014  . Benign paroxysmal positional vertigo 04/28/2014  . Right carotid bruit   . Seasonal allergic rhinitis   . Trigger thumb of left hand 10/12/2011  . Gout   . Adenomatous polyps   . CAD (coronary artery disease)   . Essential hypertension   . Hyperlipidemia   . Type 2 diabetes, controlled, with neuropathy ([place])   . Ejection fraction     Cameron Sprang,  PT, MPT [facility] [address] [place], [place], [address] Phone: [phone]   Fax:  [phone] 04/21/17, 1:13 PM  Name: YUVAN MEDINGER MRN: [id number] Date of Birth: [birth year]

## 2017-04-26 ENCOUNTER — Ambulatory Visit: Payer: Non-veteran care | Admitting: Physical Therapy

## 2017-04-26 DIAGNOSIS — R261 Paralytic gait: Secondary | ICD-10-CM

## 2017-04-26 DIAGNOSIS — M6281 Muscle weakness (generalized): Secondary | ICD-10-CM

## 2017-04-26 DIAGNOSIS — R296 Repeated falls: Secondary | ICD-10-CM

## 2017-04-26 DIAGNOSIS — R2681 Unsteadiness on feet: Secondary | ICD-10-CM

## 2017-04-26 DIAGNOSIS — R270 Ataxia, unspecified: Secondary | ICD-10-CM

## 2017-04-26 DIAGNOSIS — R269 Unspecified abnormalities of gait and mobility: Secondary | ICD-10-CM

## 2017-04-26 NOTE — Therapy (Signed)
[facility] [address] [place], [place], [address] Phone: [phone]   Fax:  [phone]  Physical Therapy Treatment  Patient Details  Name: Jeffrey [name] MRN: [id number] Date of Birth: 1940-06-18 Referring Provider: Brett Canales, PA   Encounter Date: 04/26/2017  PT End of Session - 04/26/17 1451    Visit Number  12    Number of Visits  14    Date for PT Re-Evaluation  05/05/17    Authorization Type  VA    Authorization Time Period  eval, 12 treatments, 1 re-eval    PT Start Time  1015    PT Stop Time  1100    PT Time Calculation (min)  45 min    Equipment Utilized During Treatment  Gait belt    Activity Tolerance  Patient tolerated treatment well    Behavior During Therapy  WFL for tasks assessed/performed       Past Medical History:  Diagnosis Date  . Anterior cerebral circulation hemorrhagic infarction 11/24/2014   March, 2016, dominant left thalamic and left internal capsule ischemic infarct with resultant hemorrhagic transformation, secondary to small vessel disease. Resulted in right hemiparesis dysarthria and diplopia   //   readmission with aphasia July, 2016, this improved   . Arthritis   . CAD (coronary artery disease)    PCI distal RCA ...2004, residual 70% LAD   /   ...nuclear...03/2007...no ischemia.Marland KitchenMarland Kitchenpreserved LV /  nuclear...03/03/2009...inferior scar..no ischemia..EF 51%  . Cyst of nasopharynx    per ENT Wilburn Cornelia  . Dyslipidemia    takes Atorvastatin daily  . GERD (gastroesophageal reflux disease)    takes Omeprazole daily  . Gout    takes Allopurinol daily and Colchicine as needed  . HCAP (healthcare-associated pneumonia)   . Hemiparesis affecting right side as late effect of cerebrovascular accident ([place]) 09/05/2014  . History of colon polyps   . HTN (hypertension)    takes Cardura,Metoprolol,[name],and Amlodipine daily  . Internal hemorrhoids   . Myocardial infarction [facility]) 47yr  ago  . Peripheral edema    takes Lasix daily  . 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 Right carotid bruit   . Seasonal allergic rhinitis   . [place] ([place]   . Type 2 diabetes, uncontrolled, with neuropathy ([place] 1989   takes Invokana daily and has an insulin pump ([name]    Past Surgical History:  Procedure Laterality Date  . BALLOON ANGIOPLASTY, ARTERY  1992, 2004   CAD, Dr. KRon Parker . CARDIAC CATHETERIZATION  2004  . CATARACT EXTRACTION Bilateral 2010  . COLONOSCOPY  04/03/2002   adenomatous polyp, int hemorrhoids  . COLONOSCOPY  07/18/2007   normal (Dr. SFuller Plan  . COLONOSCOPY  09/26/2012   tubular adenoma, sm int hem, rpt 5 yrs (Fuller Plan  . ELBOW SURGERY Right 1997  . eye lids raised    . NASAL SEPTUM SURGERY    . POLYPECTOMY N/A 05/27/2013   Procedure: ENDOSCOPIC NASOPHARYNGEAL MASS;  Surgeon: DJerrell Belfast MD  . REvangeline 09/2011   left, with subacromial decompression  . TONSILLECTOMY AND ADENOIDECTOMY      There were no vitals filed for this visit.  Subjective Assessment - 04/26/17 1016    [facility]  [facility] pt reports seeing some friends and people he knows at the gym.  pt reports no fall since last session.    Patient  is accompained by:  Family member    Pertinent History  CVA, CAD, GERD, gout, HTN, MI, DM2,    Limitations  Lifting;Standing;Walking    Patient Stated Goals  To improve overall, strength, balance, gait    Currently in Pain?  No/denies                      [facility] Adult PT Treatment/Exercise - 04/26/17 1020      Transfers   Transfers  Sit to Stand;Stand to Sit    Sit to Stand  5: Supervision;With upper extremity assist;From chair/3-in-1 Minimal verbal cues for use of UE support    Stand to Sit  5: Supervision;With upper extremity assist;To chair/3-in-1 minimal verbal cues to locate seat for safe decent      Ambulation/Gait    Ambulation/Gait  Yes    Ambulation/Gait Assistance  4: Min guard;4: Min assist MinA during retro gait. MinG during FWD and lateral     Ambulation/Gait Assistance Details  pt amb with quad tip cane forward, laterl and backwards. with minimal verbal cues for step pattern and step length    Ambulation Distance (Feet)  350 Feet    Assistive device  Other (Comment) cane with quad tip     Gait Pattern  Step-through pattern;Decreased arm swing - right;Decreased step length - right;Decreased stance time - right;Decreased stride length;Decreased hip/knee flexion - right;Decreased weight shift to right;Right hip hike;Right steppage;Right flexed knee in stance;Antalgic;Trunk flexed;Trunk rotated posteriorly on right;Abducted- right    Ambulation Surface  Level;Indoor    Stairs  Yes    Stairs Assistance  4: Min guard;3: Mod assist ModA balance catch with cane and no rail. Min G with L rail    Stairs Assistance Details (indicate cue type and reason)  Pt had LOB when he attempted to negotiate stairs without rail and only cane. With rail pt is MinG. Pt demonstrarted more contorl with step down starting with his LEFT foot in order to reduce axaxic foot placement of R food. pt decent looked much smoother and safer. Verbal cueing for step pattern and rail use.     Stair Management Technique  One rail Left;Step to pattern;Forwards    Number of Stairs  4    Height of Stairs  6    Ramp  4: Min assist Verbal cueing for weight shift and occasional  balance catch    Ramp Details (indicate cue type and reason)  quad tip cane, with occasional balance catch corrected by PT and pt ( min A).     Curb  4: Min assist    Curb Details (indicate cue type and reason)  Verbal cues for foot placement and stepping pattern. pt decent with LEFT foot first to reduce ataxic pattern of R significantly improved safety and smoothness of transition. pt also instructed to no place R foot right at lip of curb and to keep foot about 5" back(  width of quad tip) in order to improve foot clearance during step up quad tip cane      High Level Balance   High Level Balance Activities  Side stepping;Backward walking;Turns 10 foot distance 3x backwards and lateral each way      Knee/Hip Exercises: Aerobic   Stepper  SCIFIT 59mn, level 1 pt instructed 2 min with R arm on and 2 min with R arm off. PT demo set up,Verbal and tactile cues to set up  PT Education - 04/26/17 1446    Education provided  Yes    Education Details  SCIFIT use, curb and ramp training with quad tip cane,( keeping R foot abou 5" back form curb to keep form catching and losing balance. Pt also instructed to step though on step up and down to improve anterior/posterior balance    Person(s) Educated  Patient    Methods  Explanation;Demonstration;Tactile cues;Verbal cues    Comprehension  Verbalized understanding;Verbal cues required;Need further instruction       PT Short Term Goals - 04/07/17 1030      PT SHORT TERM GOAL #1   Title  Patient demonstrates understanding of initial HEP. (All STGs Target Date: 04/07/2017)    Baseline  verbally met 04/07/17    Time  3    Period  Weeks    Status  Achieved      PT SHORT TERM GOAL #2   Title  Patient able to pick up items from floor, reach 5" anteriorly and look over shoulders safely with supervision.     Baseline  met with close S on 04/07/17    Time  3    Period  Weeks    Status  Achieved      PT SHORT TERM GOAL #3   Title  Patient ambulates 150' with LRAD with supervision.     Baseline  met 04/07/17    Time  3    Period  Weeks    Status  Achieved        PT Long Term Goals - 03/22/17 1517      PT LONG TERM GOAL #1   Title  Pt will verbalize understanding of ongoing HEP / fitness plan. (All LTGs Target Date: 05/05/2017)    Time  6    Period  Weeks    Status  On-going    Target Date  05/05/17      PT LONG TERM GOAL #2   Title  Timed Up-Go <30seconds to indicate less dependency in ADLs &  lower fall risk.     Time  6    Period  Weeks    Status  On-going    Target Date  05/05/17      PT LONG TERM GOAL #3   Title  Gait Velocity >1.00 ft/sec for improved gait efficiency.     Time  6    Period  Weeks    Status  New    Target Date  05/05/17      PT LONG TERM GOAL #4   Title  Berg Balance Test >30/56 to indicate lower fall risk.     Time  6    Period  Weeks    Status  On-going    Target Date  05/05/17      PT LONG TERM GOAL #5   Title  Patient ambulates 300' on paved & indoor surfaces with LRAD modified independent for basic community mobility.     Time  6    Period  Weeks    Status  On-going    Target Date  05/05/17      PT LONG TERM GOAL #6   Title  Patient negotiates ramps & curbs with LRAD and stairs left rail with minimal assist for church & outings with friends.     Time  6    Period  Weeks    Status  On-going    Target Date  05/05/17  Plan - 04/26/17 1505    Clinical Impression Statement  Today PT session focused on stair, ramp, and curb training as well SCIFIT set up to make pt confident to set up SCIFIT at local [employer] to continue fitness at home. During Stair training Pt attempted to ascend stairs with cane and had a balance catch requiring Mod A to correct. After education on rail use and step patterning pt demonstrated stair negotiation with min A. pt showed significant improvement in gait mobility with quad tip cane with forward, lateral and backward walking. Pt continues to require min A support on curb and ramp negotiation but demonstrated an improvement in smoothness and safety when he steps of with his left foot first causing him to weight bear more on his R and reducing ataxic movement of the right foot. Pt also utilized this pattern on stairs and showed significant increase in safety and control. Pt will continue to benefit from skilled PT intervention to address issues noted above. Pt continues to demonstrate increase in excessive R foot  pronation and may benefit form a foot orthotic ( attachemen to AFO) to address this issue and prevent further pressure from current AFO.     Rehab Potential  Good    Clinical Impairments Affecting Rehab Potential  weakness BLE & right hemiparesis,     PT Frequency  2x / week    PT Duration  6 weeks    PT Treatment/Interventions  ADLs/Self Care Home Management;DME Instruction;Gait training;Electrical Stimulation;Stair training;Functional mobility training;Therapeutic activities;Therapeutic exercise;Balance training;Neuromuscular re-education;Patient/family education;Orthotic Fit/Training;Passive range of motion;Other (comment)    PT Next Visit Plan  Continue training gait with quad tip cane ( including stairs, curbs, ramps, multidirectional walking).    Consulted and Agree with Plan of Care  Patient;Family member/caregiver    Family Member Consulted  wife, Zaydn Gutridge       Patient will benefit from skilled therapeutic intervention in order to improve the following deficits and impairments:  Abnormal gait, Decreased activity tolerance, Decreased balance, Decreased coordination, Decreased endurance, Decreased knowledge of use of DME, Decreased mobility, Impaired flexibility, Decreased strength, Postural dysfunction  Visit Diagnosis: Ataxia  Unsteadiness on feet  Muscle weakness (generalized)  Paralytic gait  Repeated falls  Abnormality of gait  Unsteadiness     Problem List Patient Active Problem List   Diagnosis Date Noted  . Acute pharyngitis 03/15/2017  . Left hand pain 07/27/2016  . Health maintenance examination 07/27/2016  . Medicare annual wellness visit, subsequent 07/23/2015  . Advanced care planning/counseling discussion 07/23/2015  . Diplopia 07/15/2015  . Anterior cerebral circulation hemorrhagic infarction ([place]) 11/24/2014  . Hemiparesis affecting right side as late effect of cerebrovascular accident ([place]) 09/05/2014  . 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  Thrombocytopenia ([place]) 06/05/2014  . Chronic diastolic heart failure, NYHA class 1 ([place]) 06/05/2014  . Ex-smoker 06/03/2014  . Benign paroxysmal positional vertigo 04/28/2014  . Right carotid bruit   . Seasonal allergic rhinitis   . Trigger thumb of left hand 10/12/2011  . Gout   . Adenomatous polyps   . CAD (coronary artery disease)   . Essential hypertension   . Hyperlipidemia   . Type 2 diabetes, controlled, with neuropathy ([place])   . Ejection fraction    Waunita Schooner, SPT 2/202/2019, 3:10 PM  WALDRON,ROBIN PT, DPT 04/26/2017, 8:04 PM  [facility] [address] [place], [place], [address] Phone: [phone]   Fax:  [phone]  Name: Jeffrey [name] MRN: [id number] Date  of Birth: August 12, 1940

## 2017-04-28 ENCOUNTER — Encounter: Payer: Self-pay | Admitting: Rehabilitation

## 2017-04-28 ENCOUNTER — Ambulatory Visit: Payer: Non-veteran care | Admitting: Rehabilitation

## 2017-04-28 DIAGNOSIS — R270 Ataxia, unspecified: Secondary | ICD-10-CM | POA: Diagnosis not present

## 2017-04-28 DIAGNOSIS — M6281 Muscle weakness (generalized): Secondary | ICD-10-CM

## 2017-04-28 DIAGNOSIS — R2681 Unsteadiness on feet: Secondary | ICD-10-CM

## 2017-04-28 NOTE — Therapy (Signed)
[facility] [address] [place], [place], [address] Phone: [phone]   Fax:  [phone]  Physical Therapy Treatment  Patient Details  Name: Jeffrey [name] MRN: [id number] Date of Birth: 08/26/1940 Referring Provider: Brett Canales, PA   Encounter Date: 04/28/2017  PT End of Session - 04/28/17 1223    Visit Number  13    Number of Visits  14    Date for PT Re-Evaluation  05/05/17    Authorization Type  VA    Authorization Time Period  eval, 12 treatments, 1 re-eval    Authorization - Visit Number  13    Authorization - Number of Visits  14    PT Start Time  1017    PT Stop Time  1101    PT Time Calculation (min)  44 min    Equipment Utilized During Treatment  Gait belt    Activity Tolerance  Patient tolerated treatment well    Behavior During Therapy  WFL for tasks assessed/performed       Past Medical History:  Diagnosis Date  . Anterior cerebral circulation hemorrhagic infarction 11/24/2014   March, 2016, dominant left thalamic and left internal capsule ischemic infarct with resultant hemorrhagic transformation, secondary to small vessel disease. Resulted in right hemiparesis dysarthria and diplopia   //   readmission with aphasia July, 2016, this improved   . Arthritis   . CAD (coronary artery disease)    PCI distal RCA ...2004, residual 70% LAD   /   ...nuclear...03/2007...no ischemia.Marland KitchenMarland Kitchenpreserved LV /  nuclear...03/03/2009...inferior scar..no ischemia..EF 51%  . Cyst of nasopharynx    per ENT Wilburn Cornelia  . Dyslipidemia    takes Atorvastatin daily  . GERD (gastroesophageal reflux disease)    takes Omeprazole daily  . Gout    takes Allopurinol daily and Colchicine as needed  . HCAP (healthcare-associated pneumonia)   . Hemiparesis affecting right side as late effect of cerebrovascular accident ([place]) 09/05/2014  . History of colon polyps   . HTN (hypertension)    takes Cardura,Metoprolol,[name],and  Amlodipine daily  . Internal hemorrhoids   . Myocardial infarction [facility] 75yr ago  . Peripheral edema    takes Lasix daily  . 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 Right carotid bruit   . Seasonal allergic rhinitis   . Stroke ([place]   . Type 2 diabetes, uncontrolled, with neuropathy ([place] 1989   takes Invokana daily and has an insulin pump ([name]    Past Surgical History:  Procedure Laterality Date  . BALLOON ANGIOPLASTY, ARTERY  1992, 2004   CAD, Dr. KRon Parker . CARDIAC CATHETERIZATION  2004  . CATARACT EXTRACTION Bilateral 2010  . COLONOSCOPY  04/03/2002   adenomatous polyp, int hemorrhoids  . COLONOSCOPY  07/18/2007   normal (Dr. SFuller Plan  . COLONOSCOPY  09/26/2012   tubular adenoma, sm int hem, rpt 5 yrs (Fuller Plan  . ELBOW SURGERY Right 1997  . eye lids raised    . NASAL SEPTUM SURGERY    . POLYPECTOMY N/A 05/27/2013   Procedure: ENDOSCOPIC NASOPHARYNGEAL MASS;  Surgeon: DJerrell Belfast MD  . RLashmeet 09/2011   left, with subacromial decompression  . TONSILLECTOMY AND ADENOIDECTOMY      There were no vitals filed for this visit.  Subjective Assessment - 04/28/17 1025    Subjective  Got signed up with [employer], is planning to begin once therapy ends.     Pertinent History  CVA, CAD, GERD, gout, HTN, MI, DM2,    Limitations  Lifting;Standing;Walking    Patient Stated Goals  To improve overall, strength, balance, gait    Currently in Pain?  No/denies                      [facility] Adult PT Treatment/Exercise - 04/28/17 0001      Ambulation/Gait   Ambulation/Gait  Yes    Ambulation/Gait Assistance  4: Min guard    Ambulation/Gait Assistance Details  Continued gait training with use of quad tip cane during session from one task to the next.  Pt continues to demonstrate improved fluidity of gait with continued difficulty when making turns.  Cues for posture and improved R LE alignment in stance and landing  LLE in more abd position.  Also performed gait training on treadmill with use of gait trainer more for education on how to set up in gym than for gait trainer purposes.  Demonstrated safe technique getting off and on treadmill however would recommend he have some assist or at least S when getting off of treadmill.  Performed 5 mins at 1.0 mph with cues for safe use of emergency pull cord in case of LOB.  Also cues for improved stepping sequence and use of R hand to stabilize.  Pt needs constant cues for improved breathing technique throughout activity.     Ambulation Distance (Feet)  300 Feet throughout session, not including treadmill    Assistive device  -- quad tip cane    Gait Pattern  Step-through pattern;Decreased arm swing - right;Decreased step length - right;Decreased stance time - right;Decreased stride length;Decreased hip/knee flexion - right;Decreased weight shift to right;Right hip hike;Right steppage;Right flexed knee in stance;Antalgic;Trunk flexed;Trunk rotated posteriorly on right;Abducted- right    Ambulation Surface  Level;Indoor      Self-Care   Self-Care  Other Self-Care Comments    Other Self-Care Comments   Discussed many machines that he could use at [facility] and to just have someone come with him first few times to set up.  Provided education on safety with RUE use as he has no sensation and poor coordination in RUE, therefore would deter him from use of free weights and get assist for safe use of any UE machines at gym.  Provided education on safe treadmill use (see gait section).        Exercises   Exercises  Knee/Hip      Knee/Hip Exercises: Machines for Strengthening   Cybex Leg Press  Performed BLE leg press with 80 lbs x 10 reps, RLE only with 50lbs and LLE only with 60 lbs.  Provided education and demonstration on how to set up in proper position, but again recommend he ask staff as each piece of equipment can be slightly different than this clinic.  Pt and spouse  verbalized understanding.               PT Education - 04/28/17 1027    Education provided  Yes    Education Details  safe use of gym equipment and having assist for getting on, setting up and getting off of treadmill.      Person(s) Educated  Patient;Spouse    Methods  Explanation    Comprehension  Verbalized understanding       PT Short Term Goals - 04/07/17 1030      PT SHORT TERM GOAL #1   Title  Patient demonstrates understanding of initial HEP. (All STGs Target  Date: 04/07/2017)    Baseline  verbally met 04/07/17    Time  3    Period  Weeks    Status  Achieved      PT SHORT TERM GOAL #2   Title  Patient able to pick up items from floor, reach 5" anteriorly and look over shoulders safely with supervision.     Baseline  met with close S on 04/07/17    Time  3    Period  Weeks    Status  Achieved      PT SHORT TERM GOAL #3   Title  Patient ambulates 150' with LRAD with supervision.     Baseline  met 04/07/17    Time  3    Period  Weeks    Status  Achieved        PT Long Term Goals - 03/22/17 1517      PT LONG TERM GOAL #1   Title  Pt will verbalize understanding of ongoing HEP / fitness plan. (All LTGs Target Date: 05/05/2017)    Time  6    Period  Weeks    Status  On-going    Target Date  05/05/17      PT LONG TERM GOAL #2   Title  Timed Up-Go <30seconds to indicate less dependency in ADLs & lower fall risk.     Time  6    Period  Weeks    Status  On-going    Target Date  05/05/17      PT LONG TERM GOAL #3   Title  Gait Velocity >1.00 ft/sec for improved gait efficiency.     Time  6    Period  Weeks    Status  New    Target Date  05/05/17      PT LONG TERM GOAL #4   Title  Berg Balance Test >30/56 to indicate lower fall risk.     Time  6    Period  Weeks    Status  On-going    Target Date  05/05/17      PT LONG TERM GOAL #5   Title  Patient ambulates 300' on paved & indoor surfaces with LRAD modified independent for basic community mobility.      Time  6    Period  Weeks    Status  On-going    Target Date  05/05/17      PT LONG TERM GOAL #6   Title  Patient negotiates ramps & curbs with LRAD and stairs left rail with minimal assist for church & outings with friends.     Time  6    Period  Weeks    Status  On-going    Target Date  05/05/17            Plan - 04/28/17 1223    Clinical Impression Statement  Skilled session focused on preparing to use equipment at gym for transition to [facility] when therapy ends.  Also continue gait training with use of quad tip cane during session and encouraged him to continue at home.  Pt to be ready for D/C following next visit.      Rehab Potential  Good    Clinical Impairments Affecting Rehab Potential  weakness BLE & right hemiparesis,     PT Frequency  2x / week    PT Duration  6 weeks    PT Treatment/Interventions  ADLs/Self Care Home Management;DME Instruction;Gait training;Electrical Stimulation;Stair training;Functional mobility training;Therapeutic activities;Therapeutic exercise;Balance  training;Neuromuscular re-education;Patient/family education;Orthotic Fit/Training;Passive range of motion;Other (comment)    PT Next Visit Plan  LTGs and D/C    Consulted and Agree with Plan of Care  Patient;Family member/caregiver    Family Member Consulted  wife, Amilcar Reever       Patient will benefit from skilled therapeutic intervention in order to improve the following deficits and impairments:  Abnormal gait, Decreased activity tolerance, Decreased balance, Decreased coordination, Decreased endurance, Decreased knowledge of use of DME, Decreased mobility, Impaired flexibility, Decreased strength, Postural dysfunction  Visit Diagnosis: Ataxia  Unsteadiness on feet  Muscle weakness (generalized)     Problem List Patient Active Problem List   Diagnosis Date Noted  . Acute pharyngitis 03/15/2017  . Left hand pain 07/27/2016  . Health maintenance examination 07/27/2016  . Medicare  annual wellness visit, subsequent 07/23/2015  . Advanced care planning/counseling discussion 07/23/2015  . Diplopia 07/15/2015  . Anterior cerebral circulation hemorrhagic infarction ([place]) 11/24/2014  . Hemiparesis affecting right side as late effect of cerebrovascular accident ([place]) 09/05/2014  . 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 Thrombocytopenia ([place]) 06/05/2014  . Chronic diastolic heart failure, NYHA class 1 ([place]) 06/05/2014  . Ex-smoker 06/03/2014  . Benign paroxysmal positional vertigo 04/28/2014  . Right carotid bruit   . Seasonal allergic rhinitis   . Trigger thumb of left hand 10/12/2011  . Gout   . Adenomatous polyps   . CAD (coronary artery disease)   . Essential hypertension   . Hyperlipidemia   . Type 2 diabetes, controlled, with neuropathy ([place])   . Ejection fraction     Cameron Sprang, PT, MPT [facility] [address] [place], [place], [address] Phone: [phone]   Fax:  [phone] 04/28/17, 12:32 PM  Name: Jeffrey [name] MRN: [id number] Date of Birth: 1940-03-17

## 2017-05-03 ENCOUNTER — Ambulatory Visit: Payer: Non-veteran care | Admitting: Physical Therapy

## 2017-05-03 DIAGNOSIS — R270 Ataxia, unspecified: Secondary | ICD-10-CM | POA: Diagnosis not present

## 2017-05-03 DIAGNOSIS — R296 Repeated falls: Secondary | ICD-10-CM

## 2017-05-03 DIAGNOSIS — M6281 Muscle weakness (generalized): Secondary | ICD-10-CM

## 2017-05-03 DIAGNOSIS — R261 Paralytic gait: Secondary | ICD-10-CM

## 2017-05-03 DIAGNOSIS — R2681 Unsteadiness on feet: Secondary | ICD-10-CM

## 2017-05-03 DIAGNOSIS — R269 Unspecified abnormalities of gait and mobility: Secondary | ICD-10-CM

## 2017-05-03 NOTE — Therapy (Signed)
[facility] [address] [place], [place], [address] Phone: [phone]   Fax:  [phone]  Physical Therapy Treatment  Patient Details  Name: Jeffrey [name] MRN: [id number] Date of Birth: 1940-12-19 Referring Provider: Brett Canales, PA   Encounter Date: 05/03/2017  PT End of Session - 05/03/17 1121    Visit Number  14    Number of Visits  14    Date for PT Re-Evaluation  05/05/17    Authorization Type  [facility]    Authorization Time Period  eval, 12 treatments, 1 re-eval    PT Start Time  1015    PT Stop Time  1100    PT Time Calculation (min)  45 min    Equipment Utilized During Treatment  Gait belt    Activity Tolerance  Patient tolerated treatment well    Behavior During Therapy  WFL for tasks assessed/performed       Past Medical History:  Diagnosis Date  . Anterior cerebral circulation hemorrhagic infarction 11/24/2014   March, 2016, dominant left thalamic and left internal capsule ischemic infarct with resultant hemorrhagic transformation, secondary to small vessel disease. Resulted in right hemiparesis dysarthria and diplopia   //   readmission with aphasia July, 2016, this improved   . Arthritis   . CAD (coronary artery disease)    PCI distal RCA ...2004, residual 70% LAD   /   ...nuclear...03/2007...no ischemia.Marland KitchenMarland Kitchenpreserved LV /  nuclear...03/03/2009...inferior scar..no ischemia..EF 51%  . Cyst of nasopharynx    per ENT Wilburn Cornelia  . Dyslipidemia    takes Atorvastatin daily  . GERD (gastroesophageal reflux disease)    takes Omeprazole daily  . Gout    takes Allopurinol daily and Colchicine as needed  . HCAP (healthcare-associated pneumonia)   . Hemiparesis affecting right side as late effect of cerebrovascular accident ([place]) 09/05/2014  . History of colon polyps   . HTN (hypertension)    takes Cardura,Metoprolol,[name],and Amlodipine daily  . Internal hemorrhoids   . Myocardial infarction [facility]) 74yr  ago  . Peripheral edema    takes Lasix daily  . 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 Right carotid bruit   . Seasonal allergic rhinitis   . [place]   . Type 2 diabetes, uncontrolled, with neuropathy [place] 1989   takes Invokana daily and has an insulin pump ([name]    Past Surgical History:  Procedure Laterality Date  . BALLOON ANGIOPLASTY, ARTERY  1992, 2004   CAD, Dr. KRon Parker . CARDIAC CATHETERIZATION  2004  . CATARACT EXTRACTION Bilateral 2010  . COLONOSCOPY  04/03/2002   adenomatous polyp, int hemorrhoids  . COLONOSCOPY  07/18/2007   normal (Dr. SFuller Plan  . COLONOSCOPY  09/26/2012   tubular adenoma, sm int hem, rpt 5 yrs (Fuller Plan  . ELBOW SURGERY Right 1997  . eye lids raised    . NASAL SEPTUM SURGERY    . POLYPECTOMY N/A 05/27/2013   Procedure: ENDOSCOPIC NASOPHARYNGEAL MASS;  Surgeon: DJerrell Belfast MD  . [place] 09/2011   left, with subacromial decompression  . TONSILLECTOMY AND ADENOIDECTOMY      There were no vitals filed for this visit.  Subjective Assessment - 05/03/17 1024    Subjective  [facility] pt session.     Patient is accompained by:  Family member    Pertinent History  CVA, CAD, GERD,  gout, HTN, MI, DM2,    Limitations  Lifting;Standing;Walking    Patient Stated Goals  To improve overall, strength, balance, gait    Currently in Pain?  No/denies         [facility] PT Assessment - 05/03/17 1020      Ambulation/Gait   Ambulation/Gait Assistance  6: Modified independent (Device/Increase time);5: Supervision    Ambulation Distance (Feet)  480 Feet 332 with quad tip cane, 480 ft with RW ModI(outside)    Assistive device  Rolling walker;Straight cane quad tip cane    Gait Pattern  Step-through pattern;Decreased arm swing - right;Decreased step length - right;Decreased stance time - right;Decreased stride length;Decreased hip/knee  flexion - right;Decreased weight shift to right;Right hip hike;Right steppage;Right flexed knee in stance;Antalgic;Trunk flexed;Trunk rotated posteriorly on right;Abducted- right    Ambulation Surface  Level;Unlevel;Indoor;Outdoor;Paved    Gait velocity  1.77f/s with RW and 0.8322fsec with quad tip cane     Stairs  Yes    Stairs Assistance  5: Supervision    Stairs Assistance Details (indicate cue type and reason)  verbal cue to step up/down leading with L to improve stability and safety     Stair Management Technique  One rail Left;Step to pattern    Number of Stairs  4    Height of Stairs  6    Ramp  5: Supervision    Ramp Details (indicate cue type and reason)  quad tip cane     Curb  4: Min assist multiple balance MinA balance corrects requireing PT.     Curb Details (indicate cue type and reason)  Verbal cue to step down/up with L foot to improve stability and safety. pt had 2 MinA corrected loss of balance steping down with R foot first( non with step up leading with L)    Gait Comments  Supervision with Cane walking, Mod I walking with RW      Berg Balance Test   Sit to Stand  Able to stand  independently using hands    Standing Unsupported  Able to stand safely 2 minutes    Sitting with Back Unsupported but Feet Supported on Floor or Stool  Able to sit safely and securely 2 minutes    Stand to Sit  Controls descent by using hands    Transfers  Able to transfer safely, definite need of hands    Standing Unsupported with Eyes Closed  Able to stand 10 seconds with supervision    Standing Ubsupported with Feet Together  Able to place feet together independently and stand for 1 minute with supervision    From Standing, Reach Forward with Outstretched Arm  Can reach forward >12 cm safely (5")    From Standing Position, Pick up Object from Floor  Able to pick up shoe, needs supervision    From Standing Position, Turn to Look Behind Over each Shoulder  Turn sideways only but maintains  balance    Turn 360 Degrees  Needs assistance while turning    Standing Unsupported, Alternately Place Feet on Step/Stool  Able to complete >2 steps/needs minimal assist    Standing Unsupported, One Foot in Front  Needs help to step but can hold 15 seconds    Standing on One Leg  Tries to lift leg/unable to hold 3 seconds but remains standing independently    Total Score  34    Berg comment:  Initial Berg was 17/56      Timed Up and Go Test  Normal TUG (seconds)  29.97 quad tip cane                  [facility] Adult PT Treatment/Exercise - 05/03/17 1020      Transfers   Transfers  Sit to Stand;Stand to Sit    Sit to Stand  5: Supervision;With upper extremity assist;From chair/3-in-1    Stand to Sit  5: Supervision;With upper extremity assist;To chair/3-in-1      Ambulation/Gait   Ambulation/Gait  Yes             PT Education - 05/03/17 1120    Education Details  D/C, importance of HEP and continuing to be active to avoid regression and stiffness.    Person(s) Educated  Patient;Spouse    Methods  Explanation;Verbal cues    Comprehension  Verbalized understanding       PT Short Term Goals - 04/07/17 1030      PT SHORT TERM GOAL #1   Title  Patient demonstrates understanding of initial HEP. (All STGs Target Date: 04/07/2017)    Baseline  verbally met 04/07/17    Time  3    Period  Weeks    Status  Achieved      PT SHORT TERM GOAL #2   Title  Patient able to pick up items from floor, reach 5" anteriorly and look over shoulders safely with supervision.     Baseline  met with close S on 04/07/17    Time  3    Period  Weeks    Status  Achieved      PT SHORT TERM GOAL #3   Title  Patient ambulates 150' with LRAD with supervision.     Baseline  met 04/07/17    Time  3    Period  Weeks    Status  Achieved        PT Long Term Goals - 05/03/17 1128      PT LONG TERM GOAL #1   Title  Pt will verbalize understanding of ongoing HEP / fitness plan. (All LTGs Target  Date: 05/05/2017)    Baseline  MET 05/03/17    Time  6    Period  Weeks    Status  Achieved    Target Date  05/05/17      PT LONG TERM GOAL #2   Title  Timed Up-Go <30seconds to indicate less dependency in ADLs & lower fall risk.     Baseline  MET 29.97sec. on 05/03/2017    Time  6    Period  Weeks    Status  Achieved    Target Date  05/05/17      PT LONG TERM GOAL #3   Title  Gait Velocity >1.00 ft/sec for improved gait efficiency.     Baseline  MET(2/27)1.17f/sec with RW and .8367fsec with cane with quad tip    Time  6    Period  Weeks    Status  Achieved    Target Date  05/05/17      PT LONG TERM GOAL #4   Title  Berg Balance Test >30/56 to indicate lower fall risk.     Baseline  MET 05/03/2017 BeMerrilee Jansky4/56    Time  6    Period  Weeks    Status  Achieved    Target Date  05/05/17      PT LONG TERM GOAL #5   Title  Patient ambulates 300' on paved & indoor surfaces with LRAD modified independent  for basic community mobility.     Baseline  MET 05/03/2017  480 ft with RW mod I over pavement( indoor and outdoor) over mulitple ramps     Time  6    Period  Weeks    Status  Achieved    Target Date  05/05/17      PT LONG TERM GOAL #6   Title  Patient negotiates ramps & curbs with LRAD and stairs left rail with minimal assist for church & outings with friends.     Baseline  MET 05/03/2017 Min A with Ramps,curbs, stairs with 1 rail.     Time  6    Period  Weeks    Status  Achieved    Target Date  05/05/17            Plan - 05/03/17 1122    Clinical Impression Statement  Today patient and PT discus, and agree on, patient discharge form skilled PT intervention. Patient demonstrated significant improvement over this course of Physical therapy intervention meeting all of his Long term goals. Pt improved his Berg Balance assessment form a score of 17/56 to 34/56. Pt as also demonstrated moderate independence with ambulation inside and outside with his rolling walker and supervision  with his cane with quad tip. Although Pt has made significant progress pt remains a falls risk and has been educated on risk reduction and the importance of continuing to be compliant with his HEP and attending the [employer] complete exercise ( SCIFIT machine .)     Rehab Potential  Good    Clinical Impairments Affecting Rehab Potential  weakness BLE & right hemiparesis,     PT Next Visit Plan  D/C    Consulted and Agree with Plan of Care  Patient;Family member/caregiver    Family Member Consulted  wife, Ashar Lewinski       Patient will benefit from skilled therapeutic intervention in order to improve the following deficits and impairments:     Visit Diagnosis: Ataxia  Unsteadiness on feet  Muscle weakness (generalized)  Paralytic gait  Repeated falls  Abnormality of gait  Unsteadiness     Problem List Patient Active Problem List   Diagnosis Date Noted  . Acute pharyngitis 03/15/2017  . Left hand pain 07/27/2016  . Health maintenance examination 07/27/2016  . Medicare annual wellness visit, subsequent 07/23/2015  . Advanced care planning/counseling discussion 07/23/2015  . Diplopia 07/15/2015  . Anterior cerebral circulation hemorrhagic infarction ([place]) 11/24/2014  . Hemiparesis affecting right side as late effect of cerebrovascular accident ([place]) 09/05/2014  . 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 Thrombocytopenia ([place]) 06/05/2014  . Chronic diastolic heart failure, NYHA class 1 ([place]) 06/05/2014  . Ex-smoker 06/03/2014  . Benign paroxysmal positional vertigo 04/28/2014  . Right carotid bruit   . Seasonal allergic rhinitis   . Trigger thumb of left hand 10/12/2011  . Gout   . Adenomatous polyps   . CAD (coronary artery disease)   . Essential hypertension   . Hyperlipidemia   . Type 2 diabetes, controlled, with neuropathy ([place])   . Ejection fraction     PHYSICAL THERAPY DISCHARGE SUMMARY  Visits from Start of Care: 14  Current functional level related to  goals / functional outcomes: See above    Remaining deficits: See above    Education / Equipment: Safe use of RW and [place]. Importance  of continuing to do his HEP exercises and attending the [employer] to work out in order to avoid regression.  Plan: Patient agrees to discharge.  Patient goals were met. Patient is being discharged due to meeting the stated rehab goals.  ?????         Waunita Schooner SPT 05/03/2017, 11:34 AM  [facility] [address] [place], [place], [address] Phone: [phone]   Fax:  [phone]  Name: BOEN STERBENZ MRN: [id number] Date of Birth: 1940/06/17

## 2017-05-05 ENCOUNTER — Ambulatory Visit: Payer: PPO | Admitting: Rehabilitation

## 2017-05-10 ENCOUNTER — Ambulatory Visit: Payer: PPO | Admitting: Physical Therapy

## 2017-05-12 ENCOUNTER — Ambulatory Visit: Payer: PPO | Admitting: Rehabilitation

## 2017-05-22 ENCOUNTER — Ambulatory Visit: Payer: PPO | Admitting: Podiatry

## 2017-05-30 ENCOUNTER — Encounter: Payer: Self-pay | Admitting: Podiatry

## 2017-05-30 ENCOUNTER — Ambulatory Visit: Payer: PPO | Admitting: Podiatry

## 2017-05-30 DIAGNOSIS — B351 Tinea unguium: Secondary | ICD-10-CM | POA: Diagnosis not present

## 2017-05-30 DIAGNOSIS — D689 Coagulation defect, unspecified: Secondary | ICD-10-CM

## 2017-05-30 DIAGNOSIS — E1142 Type 2 diabetes mellitus with diabetic polyneuropathy: Secondary | ICD-10-CM

## 2017-05-30 DIAGNOSIS — M79675 Pain in left toe(s): Secondary | ICD-10-CM

## 2017-05-30 DIAGNOSIS — M79674 Pain in right toe(s): Secondary | ICD-10-CM

## 2017-05-30 NOTE — Progress Notes (Signed)
Subjective: 77 y.o. returns the office today for painful, elongated, thickened toenails which he cannot trim himself. Denies any redness or drainage around the nails. Denies any acute changes since last appointment and no new complaints today. Denies any systemic complaints such as fevers, chills, nausea, vomiting.   PCP: [name], MD  A1c: 6.9 (07/20/2017)  He is on plavix   Objective: AAO 3, NAD DP/PT pulses palpable, CRT less than 3 seconds Nails hypertrophic, dystrophic, elongated, brittle, discolored 10. There is tenderness overlying the nails 1-5 bilaterally. There is no surrounding erythema or drainage along the nail sites. No open lesions or pre-ulcerative lesions are identified. Has a history of a CVA affecting his right lower extremity  No other areas of tenderness bilateral lower extremities. No overlying edema, erythema, increased warmth. No pain with calf compression, swelling, warmth, erythema.  Assessment: Patient presents with symptomatic onychomycosis  Plan: -Treatment options including alternatives, risks, complications were discussed -Nails sharply debrided 10 without complication/bleeding. -Discussed daily foot inspection. If there are any changes, to call the office immediately.  -Follow-up in 3 months or sooner if any problems are to arise. In the meantime, encouraged to call the office with any questions, concerns, changes symptoms.  Celesta Gentile, DPM

## 2017-07-03 ENCOUNTER — Other Ambulatory Visit: Payer: Self-pay | Admitting: Family Medicine

## 2017-07-05 DIAGNOSIS — L720 Epidermal cyst: Secondary | ICD-10-CM | POA: Diagnosis not present

## 2017-07-05 DIAGNOSIS — B078 Other viral warts: Secondary | ICD-10-CM | POA: Diagnosis not present

## 2017-07-05 DIAGNOSIS — L821 Other seborrheic keratosis: Secondary | ICD-10-CM | POA: Diagnosis not present

## 2017-07-25 ENCOUNTER — Encounter: Payer: Self-pay | Admitting: Cardiology

## 2017-07-25 ENCOUNTER — Ambulatory Visit: Payer: PPO | Admitting: Cardiology

## 2017-07-25 VITALS — BP 150/74 | HR 57 | Ht 68.0 in | Wt 180.8 lb

## 2017-07-25 DIAGNOSIS — I2510 Atherosclerotic heart disease of native coronary artery without angina pectoris: Secondary | ICD-10-CM

## 2017-07-25 DIAGNOSIS — I10 Essential (primary) hypertension: Secondary | ICD-10-CM

## 2017-07-25 DIAGNOSIS — E119 Type 2 diabetes mellitus without complications: Secondary | ICD-10-CM

## 2017-07-25 DIAGNOSIS — I69351 Hemiplegia and hemiparesis following cerebral infarction affecting right dominant side: Secondary | ICD-10-CM | POA: Diagnosis not present

## 2017-07-25 NOTE — Patient Instructions (Signed)

## 2017-07-25 NOTE — Progress Notes (Signed)
Cardiology Office Note   Date:  07/25/2017   ID:  Jeffrey [name], DOB [birth year], MRN [id number]  PCP:  [name], MD  Cardiologist:   Candee Furbish, MD       History of Present Illness: Jeffrey [name] is a 77 y.o. male prior patient of Dr. Ron Parker here for follow up coronary artery disease status post PCI (he thinks PTCA-do not have report) with normal left ventricular function.  Unfortunately in March 2016 he had a stroke, hemorrhagic transformation, significant right hemiparesis come a rehabilitation and later returned with an episode of A. fib which improved. He had been on Plavix. No history of atrial fibrillation. He is a English as a second language teacher. Neurology did not feel the need for implantable loop recorder. He would be at increased risk for anticoagulation given hemorrhagic transformation.  Here with his family. He is retired [employer]. He is having no chest pain, no syncope, no shortness of breath, no anginal symptoms. In a wheelchair. Out of rehabilitation.   He is trying to work with the [place] to obtain a motorized wheelchair. He is also thinking about classes with the DMV to possibly drive once again. He denies any chest pain, no shortness of breath. He has been bothered by a right elbow rash, has seen dermatology. He was also concerned about his left hand possible arthritis, hard to get motion in the morning but by the end of the day is feeling better.  07/25/17 - PT for ataxia.  Enjoys.  No chest pain fevers chills nausea vomiting syncope.  Overall feeling quite well.  No changes since prior visit.    Past Medical History:  Diagnosis Date  . Anterior cerebral circulation hemorrhagic infarction [facility]) 11/24/2014   March, 2016, dominant left thalamic and left internal capsule ischemic infarct with resultant hemorrhagic transformation, secondary to small vessel disease. Resulted in right hemiparesis dysarthria and diplopia   //   readmission with aphasia July, 2016, this improved     . Arthritis   . CAD (coronary artery disease)    PCI distal RCA ...2004, residual 70% LAD   /   ...nuclear...03/2007...no ischemia.Marland KitchenMarland Kitchenpreserved LV /  nuclear...03/03/2009...inferior scar..no ischemia..EF 51%  . Cyst of nasopharynx    per ENT Wilburn Cornelia  . Dyslipidemia    takes Atorvastatin daily  . GERD (gastroesophageal reflux disease)    takes Omeprazole daily  . Gout    takes Allopurinol daily and Colchicine as needed  . HCAP (healthcare-associated pneumonia)   . Hemiparesis affecting right side as late effect of cerebrovascular accident ([place]) 09/05/2014  . History of colon polyps   . HTN (hypertension)    takes Cardura,Metoprolol,[name],and Amlodipine daily  . Internal hemorrhoids   . Myocardial infarction [facility]) 46yrs ago  . Peripheral edema    takes Lasix daily  . 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 Right carotid bruit   . Seasonal allergic rhinitis   . Stroke ([place])   . Type 2 diabetes, uncontrolled, with neuropathy ([place]) 1989   takes Invokana daily and has an insulin pump (Dr. [name])    Past Surgical History:  Procedure Laterality Date  . BALLOON ANGIOPLASTY, ARTERY  1992, 2004   CAD, Dr. Ron Parker  . CARDIAC CATHETERIZATION  2004  . CATARACT EXTRACTION Bilateral 2010  . COLONOSCOPY  04/03/2002   adenomatous polyp, int hemorrhoids  . COLONOSCOPY  07/18/2007   normal (Dr. Fuller Plan)  . COLONOSCOPY  09/26/2012   tubular adenoma, sm int hem,  rpt 5 yrs Fuller Plan)  . ELBOW SURGERY Right 1997  . eye lids raised    . NASAL SEPTUM SURGERY    . POLYPECTOMY N/A 05/27/2013   Procedure: ENDOSCOPIC NASOPHARYNGEAL MASS;  Surgeon: [name], MD  . [place]  09/2011   left, with subacromial decompression  . TONSILLECTOMY AND ADENOIDECTOMY       Current Outpatient Medications  Medication Sig Dispense Refill  . atorvastatin (LIPITOR) 80 MG tablet Take 80 mg by mouth at bedtime.    . baclofen (LIORESAL) 10 MG tablet Take 0.5  tablets (5 mg total) by mouth 3 (three) times daily. 60 each 3  . Cholecalciferol (VITAMIN D3) 5000 UNITS TABS Take 5,000 Units by mouth once a week.    . clopidogrel (PLAVIX) 75 MG tablet Take 1 tablet (75 mg total) by mouth daily. 30 tablet 6  . fosinopril ([name]) 40 MG tablet Take 40 mg by mouth daily.    . furosemide (LASIX) 40 MG tablet TAKE 1 TABLET BY MOUTH EVERY DAY 30 tablet 0  . insulin aspart (NOVOLOG FLEXPEN) 100 UNIT/ML FlexPen Inject 5-6 Units into the skin as directed. 5 units with breakfast 6 units with supper    . insulin glargine (LANTUS) 100 UNIT/ML injection Inject 18 Units into the skin at bedtime.     [name] LACTOBACILLUS PO Take 1 capsule by mouth daily. [facility] colon [facility]    . loratadine (CLARITIN) 10 MG tablet Take 10 mg by mouth daily as needed.     . metoprolol tartrate (LOPRESSOR) 25 MG tablet TAKE 1 TABLET BY MOUTH TWICE A DAY 60 tablet 7  . senna (SENOKOT) 8.6 MG tablet Take 1 tablet by mouth daily as needed for constipation. Reported on 02/23/2015    . sertraline (ZOLOFT) 25 MG tablet Take 1 tablet (25 mg total) by mouth daily. 30 tablet 11   No current facility-administered medications for this visit.     Allergies:   Niacin    Social History:  The patient  reports that he has quit smoking. He has never used smokeless tobacco. He reports that he does not drink alcohol or use drugs.   Family History:  The patient's family history includes Alcohol abuse in his father; Heart attack in his father and son.    ROS:  Please see the history of present illness.  No chest pain fevers chills nausea vomiting syncope  PHYSICAL EXAM: VS:  BP (!) 150/74   Pulse (!) 57   Ht 5\' 8"  (1.727 m)   Wt 180 lb 12.8 oz (82 kg)   BMI 27.49 kg/m  , BMI Body mass index is 27.49 kg/m. GEN: Well nourished, well developed, in no acute distress wheeelchair HEENT: normal  Neck: no JVD, carotid bruits, or masses Cardiac: RRR; no murmurs, rubs, or gallops,no edema  Respiratory:   clear to auscultation bilaterally, normal work of breathing GI: soft, nontender, nondistended, + BS MS: no deformity or atrophy  Skin: warm and dry, no rash Neuro:  Alert and Oriented x 3, right weakness Psych: euthymic mood, full affect    EKG: Today 07/25/2017-sinus bradycardia 57 with no other abnormalities.  Personally viewed 06/28/16 - SB 56 no changes, personally viewed- previously monitoring in hospital showed no evidence of atrial fibrillation.  ECHO: 06/09/14: - Left ventricle: The cavity size was normal. Wall thickness was normal. Systolic function was normal. The estimated ejection fraction was in the range of 55% to 60%. Regional wall motion abnormalities cannot be excluded. Doppler parameters are  consistent with abnormal left ventricular relaxation (grade 1 diastolic dysfunction).   Recent Labs: No results found for requested labs within last 8760 hours.    Lipid Panel    Component Value Date/Time   CHOL 135 07/20/2016   CHOL 211 02/01/2011   TRIG 191 (A) 07/20/2016   TRIG 342 09/30/2013   TRIG 342 09/30/2013   HDL 31 (A) 07/20/2016   CHOLHDL 5 07/16/2015 0950   VLDL 29.0 07/16/2015 0950   LDLCALC 65 07/20/2016   LDLCALC 98 09/30/2013   LDLCALC 98 09/30/2013   LDLDIRECT 89.6 02/07/2012 1457      Wt Readings from Last 3 Encounters:  07/25/17 180 lb 12.8 oz (82 kg)  03/15/17 170 lb (77.1 kg)  07/27/16 168 lb 8 oz (76.4 kg)      Other studies Reviewed: Additional studies/ records that were reviewed today include: Hospital records reviewed, lab work reviewed Review of the above records demonstrates: As above   ASSESSMENT AND PLAN:  Coronary artery disease status post PCI/PTCA of distal RCA 2004  - 2010 nuclear stress test showed inferior scar, no ischemia  - Ejection fraction normal on echo in April 2016.  - Continuing with Plavix post stroke.  -Not having any anginal symptoms.  Doing very well.  Continue with aggressive secondary prevention.   Statin, blood pressure control.  Stroke with hemorrhagic transformation, right-sided hemiparesis  - Plavix, no bleeding.   - Reviewed note from Dr. Leonie Man  - Continue to optimize diabetes, hyperlipidemia, blood pressure targets. Doing well.   - Overall stable.  Essential hypertension  -Mildly elevated today, continue to monitor.  - Medications reviewed.  Overall doing well. Well controlled on constellation of medications as above.  Hyperlipidemia  - Excellent.  LDL 65  - Continue with high-dose atorvastatin.  No symptoms from medication  Diabetes with polyneuropathy -Dr. Buddy Duty, hemoglobin A1c 6.6 in the past, currently 7.7 and 2018 diabetic polyneuropathy.TSH 0.7. Triglycerides have been elevated 500 in the past.  Currently 191  Current medicines are reviewed at length with the patient today.  The patient does not have concerns regarding medicines.   Orders Placed This Encounter  Procedures  . EKG 12-Lead     Disposition:   FU with [place] in 12 months  Signed, Candee Furbish, MD  07/25/2017 8:49 AM    [facility] [place], [place], [place]  [address] Phone: [phone]; Fax: [phone]

## 2017-07-26 DIAGNOSIS — E1142 Type 2 diabetes mellitus with diabetic polyneuropathy: Secondary | ICD-10-CM | POA: Diagnosis not present

## 2017-07-26 DIAGNOSIS — E785 Hyperlipidemia, unspecified: Secondary | ICD-10-CM | POA: Diagnosis not present

## 2017-07-26 DIAGNOSIS — I10 Essential (primary) hypertension: Secondary | ICD-10-CM | POA: Diagnosis not present

## 2017-07-26 DIAGNOSIS — E113299 Type 2 diabetes mellitus with mild nonproliferative diabetic retinopathy without macular edema, unspecified eye: Secondary | ICD-10-CM | POA: Diagnosis not present

## 2017-07-26 DIAGNOSIS — E1165 Type 2 diabetes mellitus with hyperglycemia: Secondary | ICD-10-CM | POA: Diagnosis not present

## 2017-07-26 DIAGNOSIS — Z794 Long term (current) use of insulin: Secondary | ICD-10-CM | POA: Diagnosis not present

## 2017-07-26 LAB — HEMOGLOBIN A1C: HEMOGLOBIN A1C: 8.1

## 2017-07-26 LAB — LIPID PANEL
Cholesterol: 210 — AB (ref 0–200)
HDL: 32 — AB (ref 35–70)
LDL Cholesterol: 114
Triglycerides: 319 — AB (ref 40–160)

## 2017-07-26 LAB — TSH
TSH: 0.91 (ref 0.41–5.90)
TSH: 0.91 (ref 0.41–5.90)

## 2017-07-26 LAB — HEPATIC FUNCTION PANEL
ALT: 34 (ref 10–40)
AST: 20 (ref 14–40)
Alkaline Phosphatase: 88 (ref 25–125)
BILIRUBIN, TOTAL: 0.9

## 2017-07-26 LAB — MICROALBUMIN, URINE: MICROALB UR: 0.7

## 2017-07-26 LAB — BASIC METABOLIC PANEL
CREATININE: 0.8 (ref 0.6–1.3)
GLUCOSE: 143
POTASSIUM: 3.5 (ref 3.4–5.3)
SODIUM: 143 (ref 137–147)

## 2017-07-28 ENCOUNTER — Encounter: Payer: Self-pay | Admitting: Gastroenterology

## 2017-07-28 ENCOUNTER — Other Ambulatory Visit: Payer: Self-pay | Admitting: Family Medicine

## 2017-07-31 ENCOUNTER — Encounter: Payer: Self-pay | Admitting: Family Medicine

## 2017-08-25 ENCOUNTER — Other Ambulatory Visit: Payer: Self-pay | Admitting: Family Medicine

## 2017-08-31 ENCOUNTER — Encounter: Payer: Self-pay | Admitting: Podiatry

## 2017-08-31 ENCOUNTER — Ambulatory Visit: Payer: PPO | Admitting: Podiatry

## 2017-08-31 DIAGNOSIS — M79675 Pain in left toe(s): Secondary | ICD-10-CM | POA: Diagnosis not present

## 2017-08-31 DIAGNOSIS — B351 Tinea unguium: Secondary | ICD-10-CM | POA: Diagnosis not present

## 2017-08-31 DIAGNOSIS — M79674 Pain in right toe(s): Secondary | ICD-10-CM | POA: Diagnosis not present

## 2017-08-31 DIAGNOSIS — E1142 Type 2 diabetes mellitus with diabetic polyneuropathy: Secondary | ICD-10-CM

## 2017-08-31 DIAGNOSIS — D689 Coagulation defect, unspecified: Secondary | ICD-10-CM

## 2017-08-31 LAB — HM DIABETES FOOT EXAM

## 2017-08-31 NOTE — Progress Notes (Signed)
Subjective: 77 y.o. returns the office today for painful, elongated, thickened toenails which he cannot trim himself. Denies any redness or drainage around the nails. Denies any acute changes since last appointment and no new complaints today. Denies any systemic complaints such as fevers, chills, nausea, vomiting.   PCP: [name], MD  A1c: 8.1 (07/26/2017)  He is on plavix   Objective: AAO 3, NAD DP/PT pulses palpable, CRT less than 3 seconds Nails hypertrophic, dystrophic, elongated, brittle, discolored 10. There is tenderness overlying the nails 1-5 bilaterally. There is no surrounding erythema or drainage along the nail sites. No open lesions or pre-ulcerative lesions are identified. Has a history of a CVA affecting his right lower extremity  No other areas of tenderness bilateral lower extremities. No overlying edema, erythema, increased warmth. No pain with calf compression, swelling, warmth, erythema.  Assessment: Patient presents with symptomatic onychomycosis  Plan: -Treatment options including alternatives, risks, complications were discussed -Nails sharply debrided 10 without complication/bleeding. -Discussed daily foot inspection. If there are any changes, to call the office immediately.  -Follow-up in 3 months or sooner if any problems are to arise. In the meantime, encouraged to call the office with any questions, concerns, changes symptoms.  Celesta Gentile, DPM

## 2017-09-22 ENCOUNTER — Other Ambulatory Visit: Payer: Self-pay | Admitting: Family Medicine

## 2017-10-19 ENCOUNTER — Other Ambulatory Visit: Payer: Self-pay | Admitting: Family Medicine

## 2017-10-26 ENCOUNTER — Ambulatory Visit: Payer: PPO | Admitting: Podiatry

## 2017-10-29 ENCOUNTER — Other Ambulatory Visit: Payer: Self-pay | Admitting: Family Medicine

## 2017-10-29 DIAGNOSIS — E1140 Type 2 diabetes mellitus with diabetic neuropathy, unspecified: Secondary | ICD-10-CM

## 2017-10-29 DIAGNOSIS — D696 Thrombocytopenia, unspecified: Secondary | ICD-10-CM

## 2017-10-29 DIAGNOSIS — M1A9XX0 Chronic gout, unspecified, without tophus (tophi): Secondary | ICD-10-CM

## 2017-10-29 DIAGNOSIS — E785 Hyperlipidemia, unspecified: Secondary | ICD-10-CM

## 2017-10-31 ENCOUNTER — Encounter: Payer: Self-pay | Admitting: Family Medicine

## 2017-10-31 ENCOUNTER — Ambulatory Visit (INDEPENDENT_AMBULATORY_CARE_PROVIDER_SITE_OTHER): Payer: PPO

## 2017-10-31 ENCOUNTER — Ambulatory Visit (INDEPENDENT_AMBULATORY_CARE_PROVIDER_SITE_OTHER): Payer: PPO | Admitting: Family Medicine

## 2017-10-31 VITALS — Temp 98.0°F | Ht 68.5 in | Wt 173.1 lb

## 2017-10-31 VITALS — BP 140/72 | HR 60 | Temp 98.0°F | Ht 68.5 in | Wt 173.1 lb

## 2017-10-31 DIAGNOSIS — M1A9XX0 Chronic gout, unspecified, without tophus (tophi): Secondary | ICD-10-CM

## 2017-10-31 DIAGNOSIS — I69351 Hemiplegia and hemiparesis following cerebral infarction affecting right dominant side: Secondary | ICD-10-CM | POA: Diagnosis not present

## 2017-10-31 DIAGNOSIS — Z7189 Other specified counseling: Secondary | ICD-10-CM

## 2017-10-31 DIAGNOSIS — E785 Hyperlipidemia, unspecified: Secondary | ICD-10-CM

## 2017-10-31 DIAGNOSIS — E1140 Type 2 diabetes mellitus with diabetic neuropathy, unspecified: Secondary | ICD-10-CM | POA: Diagnosis not present

## 2017-10-31 DIAGNOSIS — Z0000 Encounter for general adult medical examination without abnormal findings: Secondary | ICD-10-CM

## 2017-10-31 DIAGNOSIS — I10 Essential (primary) hypertension: Secondary | ICD-10-CM

## 2017-10-31 DIAGNOSIS — D696 Thrombocytopenia, unspecified: Secondary | ICD-10-CM

## 2017-10-31 DIAGNOSIS — R0989 Other specified symptoms and signs involving the circulatory and respiratory systems: Secondary | ICD-10-CM

## 2017-10-31 DIAGNOSIS — D369 Benign neoplasm, unspecified site: Secondary | ICD-10-CM | POA: Diagnosis not present

## 2017-10-31 DIAGNOSIS — F0631 Mood disorder due to known physiological condition with depressive features: Secondary | ICD-10-CM

## 2017-10-31 DIAGNOSIS — I69398 Other sequelae of cerebral infarction: Secondary | ICD-10-CM

## 2017-10-31 LAB — COMPREHENSIVE METABOLIC PANEL
ALK PHOS: 101 U/L (ref 39–117)
ALT: 32 U/L (ref 0–53)
AST: 19 U/L (ref 0–37)
Albumin: 4.1 g/dL (ref 3.5–5.2)
BILIRUBIN TOTAL: 0.8 mg/dL (ref 0.2–1.2)
BUN: 12 mg/dL (ref 6–23)
CO2: 32 meq/L (ref 19–32)
CREATININE: 0.9 mg/dL (ref 0.40–1.50)
Calcium: 9.8 mg/dL (ref 8.4–10.5)
Chloride: 102 mEq/L (ref 96–112)
GFR: 87.48 mL/min (ref >60.00)
GLUCOSE: 189 mg/dL — AB (ref 70–99)
Potassium: 3.9 mEq/L (ref 3.5–5.1)
SODIUM: 139 meq/L (ref 135–145)
Total Protein: 6.9 g/dL (ref 6.0–8.3)

## 2017-10-31 LAB — CBC WITH DIFFERENTIAL/PLATELET
BASOS ABS: 0 10*3/uL (ref 0.0–0.1)
Basophils Relative: 0.6 % (ref 0.0–3.0)
EOS ABS: 0.1 10*3/uL (ref 0.0–0.7)
EOS PCT: 1.6 % (ref 0.0–5.0)
HCT: 43 % (ref 39.0–52.0)
Hemoglobin: 14.9 g/dL (ref 13.0–17.0)
Lymphocytes Relative: 26.6 % (ref 12.0–46.0)
Lymphs Abs: 1.6 10*3/uL (ref 0.7–4.0)
MCHC: 34.8 g/dL (ref 30.0–36.0)
MCV: 87.5 fl (ref 78.0–100.0)
MONO ABS: 0.4 10*3/uL (ref 0.1–1.0)
Monocytes Relative: 6.6 % (ref 3.0–12.0)
NEUTROS PCT: 64.6 % (ref 43.0–77.0)
Neutro Abs: 3.9 10*3/uL (ref 1.4–7.7)
Platelets: 161 10*3/uL (ref 150.0–400.0)
RBC: 4.92 Mil/uL (ref 4.22–5.81)
RDW: 13.2 % (ref 11.5–15.5)
WBC: 6 10*3/uL (ref 4.0–10.5)

## 2017-10-31 LAB — MICROALBUMIN / CREATININE URINE RATIO
Creatinine,U: 32.6 mg/dL
MICROALB/CREAT RATIO: 2.1 mg/g (ref 0.0–30.0)

## 2017-10-31 LAB — LIPID PANEL
Cholesterol: 135 mg/dL (ref 0–200)
HDL: 32.8 mg/dL — AB (ref >39.00)
NONHDL: 102.39
Total CHOL/HDL Ratio: 4
Triglycerides: 239 mg/dL — ABNORMAL HIGH (ref 0.0–149.0)
VLDL: 47.8 mg/dL — ABNORMAL HIGH (ref 0.0–40.0)

## 2017-10-31 LAB — HEMOGLOBIN A1C: HEMOGLOBIN A1C: 8.2 % — AB (ref 4.6–6.5)

## 2017-10-31 LAB — LDL CHOLESTEROL, DIRECT: LDL DIRECT: 67 mg/dL

## 2017-10-31 LAB — URIC ACID: URIC ACID, SERUM: 5.9 mg/dL (ref 4.0–7.8)

## 2017-10-31 MED ORDER — FUROSEMIDE 40 MG PO TABS: 40.0000 mg | ORAL_TABLET | Freq: Every day | ORAL | 3 refills | Status: DC

## 2017-10-31 MED ORDER — SERTRALINE HCL 25 MG PO TABS: 25.0000 mg | ORAL_TABLET | Freq: Every day | ORAL | 11 refills | Status: DC

## 2017-10-31 NOTE — Progress Notes (Signed)
BP 140/72 (BP Location: Left Arm, Patient Position: Sitting, Cuff Size: Normal)   Pulse 60   Temp 98 F (36.7 C) (Oral)   Ht 5' 8.5" (1.74 m)   Wt 173 lb 2 oz (78.5 kg)   SpO2 96%   BMI 25.94 kg/m    CC: CPE Subjective:    Patient ID: Jeffrey [name], male    DOB: Mar 30, 1940, 77 y.o.   MRN: [id number]  HPI: Jeffrey [name] is a 77 y.o. male presenting on 10/31/2017 for Annual Exam (Pt 2. Will see Katha Cabal at 12:30 today. Pt accompained by his wife. )   Upcoming appt with Katha Cabal at 12:30pm today.   Hemorrhagic L thalamus stroke 05/2014 with residual R hemiparesis, dysmetria of RUE and RLE, diplopia. Right handed.Referred to [facility] neuro-ophth for for persistent diplopia with R homonymous hemianopsia s/p CVA  Endocrinology DM followed by Dr Buddy Duty Q3-4 mo Working with [facility] to resume driving ability.  Doesn't think he's taking sertraline.   Preventative: COLONOSCOPY Date: 09/26/2012 tubular adenoma, sm int hem, rpt 5 yrs [facility] Plan). Pt declines repeat at this time given h/o CVA.  Prostate cancer screening - no fmhx prostate cancer. None recent, no problems in the past. 1+ nocturia fills 2 urinals. Declines screening at this time.  Lung cancer screening - not candidate - remote smoker Flu shot - yearly Prevnar 2015, pneumovax 2014 Zoster - 2013.  shingrix - discussed Advanced directive discussion - HCPOA would be wife then 2 children. We have previously provided with advanced directive packet - he has not completed. Seat belt use discussed.  Sunscreen use and skin screen discussed, no changing moles on skin.  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 Interim medical history since our last visit reviewed. Allergies and medications reviewed and updated. Outpatient Medications Prior to Visit  Medication Sig Dispense Refill  . atorvastatin (LIPITOR) 80 MG tablet Take 80 mg by mouth at bedtime.    . baclofen (LIORESAL) 10 MG tablet Take 0.5 tablets (5 mg total) by mouth 3 (three) times daily. 60 each 3  . Cholecalciferol (VITAMIN D3) 5000 UNITS TABS Take 5,000 Units by mouth once a week.    . clopidogrel (PLAVIX) 75 MG tablet Take 1 tablet (75 mg total) by mouth daily. 30 tablet 6  . fosinopril ([name]) 40 MG tablet Take 40 mg by mouth daily.    . insulin aspart (NOVOLOG FLEXPEN) 100 UNIT/ML FlexPen Inject 5-6 Units into the skin as directed. 5 units with breakfast 6 units with supper    . insulin glargine (LANTUS) 100 UNIT/ML injection Inject 0.19 mLs (19 Units total) into the skin at bedtime. 10 mL   . LACTOBACILLUS PO Take 1 capsule by mouth daily. [facility]    . loratadine (CLARITIN) 10 MG tablet Take 10 mg by mouth daily as needed.     [name] losartan (COZAAR) 50 MG tablet Take 50 mg by mouth daily.    . metoprolol tartrate (LOPRESSOR) 25 MG tablet Take 1 tablet (25 mg total) by mouth 2 (two) times daily.    [name] senna (SENOKOT) 8.6 MG tablet Take 1 tablet  by mouth daily as needed for constipation. Reported on 02/23/2015    . furosemide (LASIX) 40 MG tablet TAKE 1 TABLET BY MOUTH EVERY DAY 30 tablet 0  . furosemide (LASIX) 40 MG tablet TAKE 1 TABLET BY MOUTH EVERY DAY 30 tablet 0  . insulin glargine (LANTUS) 100 UNIT/ML injection Inject 18 Units into the skin at bedtime.     . metoprolol tartrate (LOPRESSOR) 25 MG tablet TAKE 1 TABLET BY MOUTH TWICE A DAY 60 tablet 7  . sertraline (ZOLOFT) 25 MG tablet Take 1 tablet (25 mg total) by mouth daily. 30 tablet 11   No facility-administered medications prior to visit.      Per HPI unless specifically indicated in ROS section below Review of Systems    Constitutional: Negative for activity change, appetite change, chills, fatigue, fever and unexpected weight change.  HENT: Negative for hearing loss.   Eyes: Negative for visual disturbance.  Respiratory: Negative for cough, chest tightness, shortness of breath and wheezing.   Cardiovascular: Negative for chest pain, palpitations and leg swelling.  Gastrointestinal: Negative for abdominal distention, abdominal pain, blood in stool, constipation, diarrhea, nausea and vomiting.  Genitourinary: Negative for difficulty urinating and hematuria.  Musculoskeletal: Negative for arthralgias, myalgias and neck pain.  Skin: Negative for rash.  Neurological: Negative for dizziness, seizures, syncope and headaches.  Hematological: Negative for adenopathy. Does not bruise/bleed easily.  Psychiatric/Behavioral: Negative for dysphoric mood. The patient is not nervous/anxious.        Objective:    BP 140/72 (BP Location: Left Arm, Patient Position: Sitting, Cuff Size: Normal)   Pulse 60   Temp 98 F (36.7 C) (Oral)   Ht 5' 8.5" (1.74 m)   Wt 173 lb 2 oz (78.5 kg)   SpO2 96%   BMI 25.94 kg/m   Wt Readings from Last 3 Encounters:  10/31/17 173 lb 2 oz (78.5 kg)  07/25/17 180 lb 12.8 oz (82 kg)  03/15/17 170 lb (77.1 kg)    Physical Exam  Constitutional: He is oriented to person, place, and time. He appears well-developed and well-nourished. No distress.  Sitting in transport chair  HENT:  Head: Normocephalic and atraumatic.  Right Ear: Hearing, tympanic membrane, external ear and ear canal normal.  Left Ear: Hearing, tympanic membrane, external ear and ear canal normal.  Nose: Nose normal.  Mouth/Throat: Uvula is midline, oropharynx is clear and moist and mucous membranes are normal. No oropharyngeal exudate, posterior oropharyngeal edema or posterior oropharyngeal erythema.  Eyes: Pupils are equal, round, and reactive to light. Conjunctivae and EOM are normal. No scleral icterus.  Neck:  Normal range of motion. Neck supple. Carotid bruit is not present. No thyromegaly present.  Cardiovascular: Normal rate, regular rhythm, normal heart sounds and intact distal pulses.  No murmur heard. Pulses:      Radial pulses are 2+ on the right side, and 2+ on the left side.  Pulmonary/Chest: Effort normal and breath sounds normal. No respiratory distress. He has no wheezes. He has no rales.  Abdominal: Soft. Bowel sounds are normal. He exhibits no distension and no mass. There is no tenderness. There is no rebound and no guarding.  Musculoskeletal: Normal range of motion. He exhibits no edema.  Lymphadenopathy:    He has no cervical adenopathy.  Neurological: He is alert and oriented to person, place, and time.  CN grossly intact, station and gait intact  Skin: Skin is warm and dry. No rash noted.  Psychiatric: He has a normal mood and affect.  His behavior is normal. Judgment and thought content normal.  Nursing note and vitals reviewed.  Results for orders placed or performed in visit on 10/31/17  HM DIABETES FOOT EXAM  Result Value Ref Range   HM Diabetic Foot Exam podiatrist       Assessment & Plan:   Problem List Items Addressed This Visit    Type 2 diabetes, controlled, with neuropathy ([place])    Appreciate endo care. Update A1c.  Recent foot exam with podiatry - I updated chart.       Relevant Medications   losartan (COZAAR) 50 MG tablet   insulin glargine (LANTUS) 100 UNIT/ML injection   Thrombocytopenia ([facility])    Update labs.       Right carotid bruit    Not appreciated today.       Hyperlipidemia    Chronic, stable on lipitor. Update FLP.       Relevant Medications   losartan (COZAAR) 50 MG tablet   furosemide (LASIX) 40 MG tablet   metoprolol tartrate (LOPRESSOR) 25 MG tablet   Hemiparesis affecting right side as late effect of cerebrovascular accident [facility])    Uses leg brace and transport chair      Health maintenance examination - Primary     Preventative protocols reviewed and updated unless pt declined. Discussed healthy diet and lifestyle.       Gout    No recent flares. Update UA. Not on allopurinol.       Essential hypertension    Chronic, stable. Continue current regimen. I did recommend they take losartan 50mg  daily (confusion if he was taking this)       Relevant Medications   losartan (COZAAR) 50 MG tablet   furosemide (LASIX) 40 MG tablet   metoprolol tartrate (LOPRESSOR) 25 MG tablet   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    Actually very stable period. It seems he has continued taking sertraline. May consider trial off med.       Relevant Medications   sertraline (ZOLOFT) 25 MG tablet   Advanced care planning/counseling discussion    Advanced directive discussion - HCPOA would be wife then 2 children. We have previously provided with advanced directive packet - he has not completed.      Adenomatous polyps    Discussed this. He declines further eval including stool tests at this time.           Meds ordered this encounter  Medications  . furosemide (LASIX) 40 MG tablet    Sig: Take 1 tablet (40 mg total) by mouth daily.    Dispense:  90 tablet    Refill:  3  . sertraline (ZOLOFT) 25 MG tablet    Sig: Take 1 tablet (25 mg total) by mouth daily.    Dispense:  30 tablet    Refill:  11   Orders Placed This Encounter  Procedures  . HM DIABETES FOOT EXAM    This external order was created through the Results Console.    Follow up plan: Return in about 1 year (around 11/01/2018) for annual exam, prior fasting for blood work, medicare wellness visit.  [name], MD

## 2017-10-31 NOTE — Patient Instructions (Addendum)
If interested, check with pharmacy about new 2 shot shingles series (shingrix).  Return as needed or in 1 year for next physical.  Labs today.  You are doing well today   Health Maintenance, Male A healthy lifestyle and preventive care is important for your health and wellness. Ask your health care provider about what schedule of regular examinations is right for you. What should I know about weight and diet? Eat a Healthy Diet  Eat plenty of vegetables, fruits, whole grains, low-fat dairy products, and lean protein.  Do not eat a lot of foods high in solid fats, added sugars, or salt.  Maintain a Healthy Weight Regular exercise can help you achieve or maintain a healthy weight. You should:  Do at least 150 minutes of exercise each week. The exercise should increase your heart rate and make you sweat (moderate-intensity exercise).  Do strength-training exercises at least twice a week.  Watch Your Levels of Cholesterol and Blood Lipids  Have your blood tested for lipids and cholesterol every 5 years starting at 75 years of age. If you are at high risk for heart disease, you should start having your blood tested when you are 75 years old. You may need to have your cholesterol levels checked more often if: ? Your lipid or cholesterol levels are high. ? You are older than 75 years of age. ? You are at high risk for heart disease.  What should I know about cancer screening? Many types of cancers can be detected early and may often be prevented. Lung Cancer  You should be screened every year for lung cancer if: ? You are a current smoker who has smoked for at least 30 years. ? You are a former smoker who has quit within the past 15 years.  Talk to your health care provider about your screening options, when you should start screening, and how often you should be screened.  Colorectal Cancer  Routine colorectal cancer screening usually begins at 75 years of age and should be repeated  every 5-10 years until you are 75 years old. You may need to be screened more often if early forms of precancerous polyps or small growths are found. Your health care provider may recommend screening at an earlier age if you have risk factors for colon cancer.  Your health care provider may recommend using home test kits to check for hidden blood in the stool.  A small camera at the end of a tube can be used to examine your colon (sigmoidoscopy or colonoscopy). This checks for the earliest forms of colorectal cancer.  Prostate and Testicular Cancer  Depending on your age and overall health, your health care provider may do certain tests to screen for prostate and testicular cancer.  Talk to your health care provider about any symptoms or concerns you have about testicular or prostate cancer.  Skin Cancer  Check your skin from head to toe regularly.  Tell your health care provider about any new moles or changes in moles, especially if: ? There is a change in a mole's size, shape, or color. ? You have a mole that is larger than a pencil eraser.  Always use sunscreen. Apply sunscreen liberally and repeat throughout the day.  Protect yourself by wearing long sleeves, pants, a wide-brimmed hat, and sunglasses when outside.  What should I know about heart disease, diabetes, and high blood pressure?  If you are 60-43 years of age, have your blood pressure checked every 3-5 years. If  If you are 40 years of age or older, have your blood pressure checked every year. You should have your blood pressure measured twice-once when you are at a hospital or clinic, and once when you are not at a hospital or clinic. Record the average of the two measurements. To check your blood pressure when you are not at a hospital or clinic, you can use: ? An automated blood pressure machine at a pharmacy. ? A home blood pressure monitor.  Talk to your health care provider about your target blood pressure.  If you are  between 45-79 years old, ask your health care provider if you should take aspirin to prevent heart disease.  Have regular diabetes screenings by checking your fasting blood sugar level. ? If you are at a normal weight and have a low risk for diabetes, have this test once every three years after the age of 45. ? If you are overweight and have a high risk for diabetes, consider being tested at a younger age or more often.  A one-time screening for abdominal aortic aneurysm (AAA) by ultrasound is recommended for men aged 65-75 years who are current or former smokers. What should I know about preventing infection? Hepatitis B If you have a higher risk for hepatitis B, you should be screened for this virus. Talk with your health care provider to find out if you are at risk for hepatitis B infection. Hepatitis C Blood testing is recommended for:  Everyone born from 1945 through 1965.  Anyone with known risk factors for hepatitis C.  Sexually Transmitted Diseases (STDs)  You should be screened each year for STDs including gonorrhea and chlamydia if: ? You are sexually active and are younger than 75 years of age. ? You are older than 75 years of age and your health care provider tells you that you are at risk for this type of infection. ? Your sexual activity has changed since you were last screened and you are at an increased risk for chlamydia or gonorrhea. Ask your health care provider if you are at risk.  Talk with your health care provider about whether you are at high risk of being infected with HIV. Your health care provider may recommend a prescription medicine to help prevent HIV infection.  What else can I do?  Schedule regular health, dental, and eye exams.  Stay current with your vaccines (immunizations).  Do not use any tobacco products, such as cigarettes, chewing tobacco, and e-cigarettes. If you need help quitting, ask your health care provider.  Limit alcohol intake to no  more than 2 drinks per day. One drink equals 12 ounces of beer, 5 ounces of wine, or 1 ounces of hard liquor.  Do not use street drugs.  Do not share needles.  Ask your health care provider for help if you need support or information about quitting drugs.  Tell your health care provider if you often feel depressed.  Tell your health care provider if you have ever been abused or do not feel safe at home. This information is not intended to replace advice given to you by your health care provider. Make sure you discuss any questions you have with your health care provider. Document Released: 08/20/2007 Document Revised: 10/21/2015 Document Reviewed: 11/25/2014 Elsevier Interactive Patient Education  2018 Elsevier Inc.  

## 2017-10-31 NOTE — Assessment & Plan Note (Signed)
Chronic, stable on lipitor. Update FLP.

## 2017-10-31 NOTE — Assessment & Plan Note (Signed)
Actually very stable period. It seems he has continued taking sertraline. May consider trial off med.

## 2017-10-31 NOTE — Assessment & Plan Note (Signed)
Not appreciated today.  

## 2017-10-31 NOTE — Assessment & Plan Note (Signed)
Preventative protocols reviewed and updated unless pt declined. Discussed healthy diet and lifestyle.  

## 2017-10-31 NOTE — Patient Instructions (Signed)
Mr. Holliman , Thank you for taking time to come for your Medicare Wellness Visit. I appreciate your ongoing commitment to your health goals. Please review the following plan we discussed and let me know if I can assist you in the future.   These are the goals we discussed: Goals    . Increase physical activity     Starting 10/31/2017, I will attempt to do physical therapy exercises for at least 15 minutes 5 days per week.        This is a list of the screening recommended for you and due dates:  Health Maintenance  Topic Date Due  . Flu Shot  06/06/2018*  . DTaP/Tdap/Td vaccine (1 - Tdap) 11/01/2018*  . Tetanus Vaccine  11/01/2018*  . Colon Cancer Screening  10/31/2048*  . Eye exam for diabetics  11/05/2017  . Complete foot exam   01/05/2018  . Hemoglobin A1C  05/03/2018  . Pneumonia vaccines  Completed  *Topic was postponed. The date shown is not the original due date.   Preventive Care for Adults  A healthy lifestyle and preventive care can promote health and wellness. Preventive health guidelines for adults include the following key practices.  . A routine yearly physical is a good way to check with your health care provider about your health and preventive screening. It is a chance to share any concerns and updates on your health and to receive a thorough exam.  . Visit your dentist for a routine exam and preventive care every 6 months. Brush your teeth twice a day and floss once a day. Good oral hygiene prevents tooth decay and gum disease.  . The frequency of eye exams is based on your age, health, family medical history, use  of contact lenses, and other factors. Follow your health care provider's recommendations for frequency of eye exams.  . Eat a healthy diet. Foods like vegetables, fruits, whole grains, low-fat dairy products, and lean protein foods contain the nutrients you need without too many calories. Decrease your intake of foods high in solid fats, added sugars,  and salt. Eat the right amount of calories for you. Get information about a proper diet from your health care provider, if necessary.  . Regular physical exercise is one of the most important things you can do for your health. Most adults should get at least 150 minutes of moderate-intensity exercise (any activity that increases your heart rate and causes you to sweat) each week. In addition, most adults need muscle-strengthening exercises on 2 or more days a week.  Silver Sneakers may be a benefit available to you. To determine eligibility, you may visit the website: www.silversneakers.com or contact program at 220-054-6852 Mon-Fri between 8AM-8PM.   . Maintain a healthy weight. The body mass index (BMI) is a screening tool to identify possible weight problems. It provides an estimate of body fat based on height and weight. Your health care provider can find your BMI and can help you achieve or maintain a healthy weight.   For adults 20 years and older: ? A BMI below 18.5 is considered underweight. ? A BMI of 18.5 to 24.9 is normal. ? A BMI of 25 to 29.9 is considered overweight. ? A BMI of 30 and above is considered obese.   . Maintain normal blood lipids and cholesterol levels by exercising and minimizing your intake of saturated fat. Eat a balanced diet with plenty of fruit and vegetables. Blood tests for lipids and cholesterol should begin at age 7  and be repeated every 5 years. If your lipid or cholesterol levels are high, you are over 50, or you are at high risk for heart disease, you may need your cholesterol levels checked more frequently. Ongoing high lipid and cholesterol levels should be treated with medicines if diet and exercise are not working.  . If you smoke, find out from your health care provider how to quit. If you do not use tobacco, please do not start.  . If you choose to drink alcohol, please do not consume more than 2 drinks per day. One drink is considered to be 12  ounces (355 mL) of beer, 5 ounces (148 mL) of wine, or 1.5 ounces (44 mL) of liquor.  . If you are 55-74 years old, ask your health care provider if you should take aspirin to prevent strokes.  . Use sunscreen. Apply sunscreen liberally and repeatedly throughout the day. You should seek shade when your shadow is shorter than you. Protect yourself by wearing long sleeves, pants, a wide-brimmed hat, and sunglasses year round, whenever you are outdoors.  . Once a month, do a whole body skin exam, using a mirror to look at the skin on your back. Tell your health care provider of new moles, moles that have irregular borders, moles that are larger than a pencil eraser, or moles that have changed in shape or color.

## 2017-10-31 NOTE — Assessment & Plan Note (Signed)
Update labs.  

## 2017-10-31 NOTE — Assessment & Plan Note (Signed)
Uses leg brace and transport chair

## 2017-10-31 NOTE — Assessment & Plan Note (Signed)
Advanced directive discussion - HCPOA would be wife then 2 children. We have previously provided with advanced directive packet - he has not completed.

## 2017-10-31 NOTE — Assessment & Plan Note (Addendum)
Chronic, stable. Continue current regimen. I did recommend they take losartan 50mg  daily (confusion if he was taking this)

## 2017-10-31 NOTE — Progress Notes (Signed)
PCP notes:   Health maintenance:  Flu vaccine - addressed Tetanus vaccine - postponed/insurance A1C - completed Foot exam - per pt, podiatrist completes exam Eye exam - per pt, last vision exam in Sept 2018 Colon cancer screening - pt declined colonoscopy  Abnormal screenings:   None  Patient concerns:   None  Nurse concerns:  None  Next PCP appt:   N/A; CPE prior to AWV

## 2017-10-31 NOTE — Assessment & Plan Note (Signed)
No recent flares. Update UA. Not on allopurinol.

## 2017-10-31 NOTE — Progress Notes (Signed)
Subjective:   Jeffrey [name] is a 77 y.o. male who presents for Medicare Annual/Subsequent preventive examination.  Review of Systems:  N/A Cardiac Risk Factors include: advanced age (>94men, >40 women);male gender;hypertension;diabetes mellitus;dyslipidemia     Objective:    Vitals: Temp 98 F (36.7 C)   Ht 5' 8.5" (1.74 m)   Wt 173 lb 2 oz (78.5 kg)   SpO2 96%   BMI 25.94 kg/m   Body mass index is 25.94 kg/m.  Advanced Directives 10/31/2017 03/15/2017 04/20/2015 04/02/2015 03/24/2015 03/19/2015 02/19/2015  Does Patient Have a Medical Advance Directive? No Yes Yes Yes Yes Yes Yes  Type of Advance Directive - [place];Living will Healthcare Power of [place] of [place] of [place] of [place]  Does patient want to make changes to medical advance directive? - - - - - - -  Copy of [place] in Chart? - Yes - - - - -  Would patient like information on creating a medical advance directive? No - Patient declined - - - - - -  Pre-existing out of facility DNR order (yellow form or pink MOST form) - - - - - - -    Tobacco Social History   Tobacco Use  Smoking Status Former Smoker  Smokeless Tobacco Never Used  Tobacco Comment   quit smoking 45 years ago.     Counseling given: No Comment: quit smoking 45 years ago.   Clinical Intake:  Pre-visit preparation completed: Yes  Pain : No/denies pain Pain Score: 0-No pain     Nutritional Status: BMI 25 -29 Overweight Nutritional Risks: None Diabetes: No  How often do you need to have someone help you when you read instructions, pamphlets, or other written materials from your doctor or pharmacy?: 1 - Never What is the last grade level you completed in school?: Bachelors degree  Interpreter Needed?: No  Comments: pt lives wtih spouse Information entered by :: LPinson, LPN  Past Medical History:  Diagnosis  Date  . Anterior cerebral circulation hemorrhagic infarction [facility]) 11/24/2014   March, 2016, dominant left thalamic and left internal capsule ischemic infarct with resultant hemorrhagic transformation, secondary to small vessel disease. Resulted in right hemiparesis dysarthria and diplopia   //   readmission with aphasia July, 2016, this improved   . Arthritis   . CAD (coronary artery disease)    PCI distal RCA ...2004, residual 70% LAD   /   ...nuclear...03/2007...no ischemia.Marland KitchenMarland Kitchenpreserved LV /  nuclear...03/03/2009...inferior scar..no ischemia..EF 51%  . Cyst of nasopharynx    per ENT Wilburn Cornelia  . Dyslipidemia    takes Atorvastatin daily  . GERD (gastroesophageal reflux disease)    takes Omeprazole daily  . Gout    takes Allopurinol daily and Colchicine as needed  . HCAP (healthcare-associated pneumonia)   . Hemiparesis affecting right side as late effect of cerebrovascular accident ([place]) 09/05/2014  . History of colon polyps   . HTN (hypertension)    takes Cardura,Metoprolol,[name],and Amlodipine daily  . Internal hemorrhoids   . Myocardial infarction [facility]) 76yrs ago  . Peripheral edema    takes Lasix daily  . 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 Right carotid bruit   . Seasonal allergic rhinitis   . Stroke ([place])   . Type 2 diabetes, uncontrolled, with neuropathy ([place]) 1989   takes Invokana daily and has an insulin pump (Dr. [name])  Past Surgical History:  Procedure Laterality Date  . BALLOON ANGIOPLASTY, ARTERY  1992, 2004   CAD, Dr. Ron Parker  . CARDIAC CATHETERIZATION  2004  . CATARACT EXTRACTION Bilateral 2010  . COLONOSCOPY  04/03/2002   adenomatous polyp, int hemorrhoids  . COLONOSCOPY  07/18/2007   normal (Dr. Fuller Plan)  . COLONOSCOPY  09/26/2012   tubular adenoma, sm int hem, rpt 5 yrs Fuller Plan)  . ELBOW SURGERY Right 1997  . eye lids raised    . NASAL SEPTUM SURGERY    . POLYPECTOMY N/A 05/27/2013   Procedure: ENDOSCOPIC  NASOPHARYNGEAL MASS;  Surgeon: [name], MD  . [place]  09/2011   left, with subacromial decompression  . TONSILLECTOMY AND ADENOIDECTOMY     Family History  Problem Relation Age of Onset  . Alcohol abuse Father   . Heart attack Father   . Heart attack Son   . Coronary artery disease Neg Hx   . Stroke Neg Hx   . Cancer Neg Hx   . Diabetes Neg Hx   . Colon cancer Neg Hx    Social History   Socioeconomic History  . Marital status: Married    Spouse name: Not on file  . Number of children: 2  . Years of education: Bachelors  . Highest education level: Not on file  Occupational History  . Occupation: Music therapist: [employer]  . Financial resource strain: Not on file  . Food insecurity:    Worry: Not on file    Inability: Not on file  . Transportation needs:    Medical: Not on file    Non-medical: Not on file  Tobacco Use  . Smoking status: Former Research scientist (life sciences)  . Smokeless tobacco: Never Used  . Tobacco comment: quit smoking 45 years ago.  Substance and Sexual Activity  . Alcohol use: No  . Drug use: No  . Sexual activity: Yes  Lifestyle  . Physical activity:    Days per week: Not on file    Minutes per session: Not on file  . Stress: Not on file  Relationships  . Social connections:    Talks on phone: Not on file    Gets together: Not on file    Attends religious service: Not on file    Active member of club or organization: Not on file    Attends meetings of clubs or organizations: Not on file    Relationship status: Not on file  Other Topics Concern  . Not on file  Social History Narrative   Caffeine: 6-7 cups coffee   Lives with wife, no pets   2 grown children, 4 grandchildren   Occu: Artist   Edu: [employer]   Activity: not much   Diet: plenty of water, fruits and vegetables   Some care through [place]      Endo: Dr. Buddy Duty    Outpatient Encounter Medications as of 10/31/2017    Medication Sig  . atorvastatin (LIPITOR) 80 MG tablet Take 80 mg by mouth at bedtime.  . baclofen (LIORESAL) 10 MG tablet Take 0.5 tablets (5 mg total) by mouth 3 (three) times daily.  . Cholecalciferol (VITAMIN D3) 5000 UNITS TABS Take 5,000 Units by mouth once a week.  . clopidogrel (PLAVIX) 75 MG tablet Take 1 tablet (75 mg total) by mouth daily.  . fosinopril ([name]) 40 MG tablet Take 40 mg by mouth daily.  . furosemide (LASIX) 40 MG tablet Take 1  tablet (40 mg total) by mouth daily.  . insulin aspart (NOVOLOG FLEXPEN) 100 UNIT/ML FlexPen Inject 5-6 Units into the skin as directed. 5 units with breakfast 6 units with supper  . insulin glargine (LANTUS) 100 UNIT/ML injection Inject 0.19 mLs (19 Units total) into the skin at bedtime.  [name] LACTOBACILLUS PO Take 1 capsule by mouth daily. [facility] colon [facility]  . loratadine (CLARITIN) 10 MG tablet Take 10 mg by mouth daily as needed.   [name] losartan (COZAAR) 50 MG tablet Take 50 mg by mouth daily.  . metoprolol tartrate (LOPRESSOR) 25 MG tablet Take 1 tablet (25 mg total) by mouth 2 (two) times daily.  [name] senna (SENOKOT) 8.6 MG tablet Take 1 tablet by mouth daily as needed for constipation. Reported on 02/23/2015  . sertraline (ZOLOFT) 25 MG tablet Take 1 tablet (25 mg total) by mouth daily.   No facility-administered encounter medications on file as of 10/31/2017.     Activities of Daily Living In your present state of health, do you have any difficulty performing the following activities: 10/31/2017  Hearing? Y  Vision? Y  Comment related to stroke  Difficulty concentrating or making decisions? Y  Comment relate to stroke  Walking or climbing stairs? Y  Dressing or bathing? Y  Doing errands, shopping? Y  Preparing Food and eating ? Y  Using the Toilet? N  In the past six months, have you accidently leaked urine? N  Do you have problems with loss of bowel control? N  Managing your Medications? Y  Managing your Finances? Y  Housekeeping  or managing your Housekeeping? Y  Some recent data might be hidden    Patient Care Team: [name], MD as PCP - General (Family Medicine) Delia Chimes, NP (Inactive) as Nurse Practitioner (Nurse Practitioner)   Assessment:   This is a routine wellness examination for Jeffrey [name].  Hearing Screening Comments: Hearing aids Vision Screening Comments: Vision exam in 2018; future appt scheduled in Sept 2019   Exercise Activities and Dietary recommendations Current Exercise Habits: The patient does not participate in regular exercise at present, Exercise limited by: orthopedic condition(s);neurologic condition(s)  Goals    . Increase physical activity     Starting 10/31/2017, I will attempt to do physical therapy exercises for at least 15 minutes 5 days per week.        Fall Risk Fall Risk  10/31/2017 07/27/2016 07/23/2015 06/16/2015 12/17/2014  Falls in the past year? No No No No No   Depression Screen PHQ 2/9 Scores 10/31/2017 07/27/2016 07/23/2015 12/17/2014  PHQ - 2 Score 0 0 0 0  PHQ- 9 Score 0 - - -    Cognitive Function MMSE - Mini Mental State Exam 10/31/2017  Orientation to time 5  Orientation to Place 5  Registration 3  Attention/ Calculation 0  Recall 3  Language- name 2 objects 0  Language- repeat 1  Language- follow 3 step command 3  Language- read & follow direction 0  Write a sentence 0  Copy design 0  Total score 20     PLEASE NOTE: A Mini-Cog screen was completed. Maximum score is 20. A value of 0 denotes this part of [place] MMSE was not completed or the patient failed this part of the Mini-Cog screening.   Mini-Cog Screening Orientation to Time - Max 5 pts Orientation to Place - Max 5 pts Registration - Max 3 pts Recall - Max 3 pts Language Repeat - Max 1 pts Language Follow 3 Step Command -  Max 3 pts     Immunization History  Administered Date(s) Administered  . Influenza,inj,Quad PF,6+ Mos 12/22/2015  . Influenza-Unspecified 02/04/2014,  12/08/2014, 12/29/2016  . Pneumococcal Conjugate-13 02/04/2014  . Pneumococcal Polysaccharide-23 10/11/2012  . Zoster 03/08/2011   Screening Tests Health Maintenance  Topic Date Due  . INFLUENZA VACCINE  06/06/2018 (Originally 10/05/2017)  . DTaP/Tdap/Td (1 - Tdap) 11/01/2018 (Originally 01/16/1960)  . TETANUS/TDAP  11/01/2018 (Originally 01/16/1960)  . COLONOSCOPY  10/31/2048 (Originally 09/26/2017)  . OPHTHALMOLOGY EXAM  11/05/2017  . FOOT EXAM  01/05/2018  . HEMOGLOBIN A1C  05/03/2018  . PNA vac Low Risk Adult  Completed       Plan:     I have personally reviewed, addressed, and noted the following in the patient's chart:  A. Medical and social history B. Use of alcohol, tobacco or illicit drugs  C. Current medications and supplements D. Functional ability and status E.  Nutritional status F.  Physical activity G. Advance directives H. List of other physicians I.  Hospitalizations, surgeries, and ER visits in previous 12 months J.  [place] to include hearing, vision, cognitive, depression L. Referrals and appointments - none  In addition, I have reviewed and discussed with patient certain preventive protocols, quality metrics, and best practice recommendations. A written personalized care plan for preventive services as well as general preventive health recommendations were provided to patient.  See attached scanned questionnaire for additional information.   Signed,   Lindell Noe, MHA, BS, LPN Health Coach

## 2017-10-31 NOTE — Assessment & Plan Note (Addendum)
Discussed this. He declines further eval including stool tests at this time.

## 2017-10-31 NOTE — Assessment & Plan Note (Addendum)
Appreciate endo care. Update A1c.  Recent foot exam with podiatry - I updated chart.

## 2017-11-24 ENCOUNTER — Other Ambulatory Visit: Payer: Self-pay | Admitting: Family Medicine

## 2017-11-24 NOTE — Telephone Encounter (Signed)
Per last OV on 10/31/17, pt was considering a trial off sertraline.    Spoke with pt's wife, Romie Minus (on dpr), asking if pt is still taking med.  Confirms he is and he gets it through [facility] hospital. Fyi to Dr. Darnell Level.

## 2017-11-24 NOTE — Telephone Encounter (Signed)
Pt states he gets this through the [place]

## 2017-11-30 ENCOUNTER — Ambulatory Visit (INDEPENDENT_AMBULATORY_CARE_PROVIDER_SITE_OTHER): Payer: PPO | Admitting: Podiatry

## 2017-11-30 DIAGNOSIS — E1142 Type 2 diabetes mellitus with diabetic polyneuropathy: Secondary | ICD-10-CM | POA: Diagnosis not present

## 2017-11-30 DIAGNOSIS — L600 Ingrowing nail: Secondary | ICD-10-CM

## 2017-11-30 DIAGNOSIS — D689 Coagulation defect, unspecified: Secondary | ICD-10-CM

## 2017-11-30 DIAGNOSIS — M79674 Pain in right toe(s): Secondary | ICD-10-CM

## 2017-11-30 DIAGNOSIS — B351 Tinea unguium: Secondary | ICD-10-CM

## 2017-11-30 DIAGNOSIS — M79675 Pain in left toe(s): Secondary | ICD-10-CM | POA: Diagnosis not present

## 2017-11-30 NOTE — Progress Notes (Signed)
Subjective: 77 y.o. returns the office today for painful, elongated, thickened toenails which he cannot trim himself. He states his left big toenail is getting ingrown and causing some pain. Denies any redness or drainage around the nails. Denies any acute changes since last appointment and no new complaints today. Denies any systemic complaints such as fevers, chills, nausea, vomiting.   PCP: [name], MD  He is on plavix   Objective: AAO 3, NAD DP/PT pulses palpable, CRT less than 3 seconds Nails hypertrophic, dystrophic, elongated, brittle, discolored 10. There is tenderness overlying the nails 1-5 bilaterally. There is no surrounding erythema or drainage along the nail sites. The left hallux toenail is ingrown on both corners but there is no signs of infection.  No open lesions or pre-ulcerative lesions are identified. Has a history of a CVA affecting his right lower extremity  No other areas of tenderness bilateral lower extremities. No overlying edema, erythema, increased warmth. No pain with calf compression, swelling, warmth, erythema.  Assessment: Patient presents with symptomatic onychomycosis; ingrown toenail left hallux  Plan: -Treatment options including alternatives, risks, complications were discussed -Nails sharply debrided 10 without complication/bleeding. I debrided the symptomatic portion of the left hallux toenail without any complications. Monitor for any signs of infection.  -Discussed daily foot inspection. If there are any changes, to call the office immediately.  -Follow-up in 3 months or sooner if any problems are to arise. In the meantime, encouraged to call the office with any questions, concerns, changes symptoms.  Celesta Gentile, DPM

## 2017-12-03 NOTE — Progress Notes (Signed)
I reviewed health advisor's note, was available for consultation, and agree with documentation and plan.  

## 2017-12-04 ENCOUNTER — Telehealth: Payer: Self-pay

## 2017-12-04 NOTE — Telephone Encounter (Signed)
Please see refill request on 11/24/17 for Sertraline which was denied because pt was getting at [facility].Please advise.

## 2017-12-04 NOTE — Telephone Encounter (Signed)
Copied from [place] [id number]. Topic: General - Other >> Dec 04, 2017  3:57 PM Carolyn Stare wrote:  Pt gets the below med filled at [facility] and is asking if Dr Darnell Level will RX the below med  baclofen (LIORESAL) 10 MG tablet  [facility] [place]

## 2017-12-04 NOTE — Telephone Encounter (Signed)
Copied from [place] [id number]. Topic: General - Other >> Dec 04, 2017  3:57 PM Carolyn Stare wrote:  Pt gets the below med filled at [facility] and is asking if Dr Darnell Level will RX the below med  baclofen (LIORESAL) 10 MG tablet  [facility] [place]

## 2017-12-05 NOTE — Telephone Encounter (Signed)
Spoke with pt's wife, Peter Congo (on dpr), informing her the rx was denied due [facility].  Verbalizes understanding and says they have actually [facility] with [facility] to [facility]. Expresses her thanks for the call back.

## 2018-01-30 DIAGNOSIS — Z794 Long term (current) use of insulin: Secondary | ICD-10-CM | POA: Diagnosis not present

## 2018-01-30 DIAGNOSIS — Z23 Encounter for immunization: Secondary | ICD-10-CM | POA: Diagnosis not present

## 2018-01-30 DIAGNOSIS — Z8673 Personal history of transient ischemic attack (TIA), and cerebral infarction without residual deficits: Secondary | ICD-10-CM | POA: Diagnosis not present

## 2018-01-30 DIAGNOSIS — E785 Hyperlipidemia, unspecified: Secondary | ICD-10-CM | POA: Diagnosis not present

## 2018-01-30 DIAGNOSIS — E1142 Type 2 diabetes mellitus with diabetic polyneuropathy: Secondary | ICD-10-CM | POA: Diagnosis not present

## 2018-01-30 DIAGNOSIS — E11319 Type 2 diabetes mellitus with unspecified diabetic retinopathy without macular edema: Secondary | ICD-10-CM | POA: Diagnosis not present

## 2018-01-30 DIAGNOSIS — E1165 Type 2 diabetes mellitus with hyperglycemia: Secondary | ICD-10-CM | POA: Diagnosis not present

## 2018-01-30 DIAGNOSIS — I10 Essential (primary) hypertension: Secondary | ICD-10-CM | POA: Diagnosis not present

## 2018-01-30 LAB — HEMOGLOBIN A1C
HEMOGLOBIN A1C: 8.7 — AB (ref 4.0–6.0)
Hemoglobin A1C: 8.7

## 2018-02-04 ENCOUNTER — Encounter: Payer: Self-pay | Admitting: Family Medicine

## 2018-02-08 ENCOUNTER — Ambulatory Visit: Payer: PPO | Admitting: Podiatry

## 2018-02-08 DIAGNOSIS — B351 Tinea unguium: Secondary | ICD-10-CM | POA: Diagnosis not present

## 2018-02-08 DIAGNOSIS — M79675 Pain in left toe(s): Secondary | ICD-10-CM | POA: Diagnosis not present

## 2018-02-08 DIAGNOSIS — M79674 Pain in right toe(s): Secondary | ICD-10-CM | POA: Diagnosis not present

## 2018-02-08 DIAGNOSIS — L600 Ingrowing nail: Secondary | ICD-10-CM

## 2018-02-08 DIAGNOSIS — E1142 Type 2 diabetes mellitus with diabetic polyneuropathy: Secondary | ICD-10-CM | POA: Diagnosis not present

## 2018-02-08 NOTE — Progress Notes (Signed)
Subjective: Jeffrey [name] presents today with diabetes and cc of painful, discolored, thick toenails which interfere with daily activities.  Pain is aggravated when wearing enclosed shoe gear. Pain is relieved with periodic professional debridement.  Patient is also on Plavix.  Objective: Vascular Examination: Capillary refill time <3 seconds x 10 digits Dorsalis pedis and posterior tibial pulses present b/l No digital hair x 10 digits Skin temperature gradient is WNL b/l  Dermatological Examination: Skin thin and atrophic b/l  Toenails 1-5 b/l discolored, thick, dystrophic with subungual debris and pain with palpation to nailbeds due to thickness of nails. There is a split toenail noted left 3rd toe and an ingrown toenail b/l borders left great toe. No erythema, no edema, no drainage to either digit.   Musculoskeletal: Muscle strength 5/5 to all LE muscle groups LLE RLE weakness secondary to CVA  Neurological: Sensation intact with 10 gram monofilament. Vibratory sensation intact.  Assessment: 1. Painful onychomycosis toenails 1-5 b/l  2. Ingrown toenail left great toe lateral border, noninfected 3. NIDDM with neuropathy  Plan: 1. Toenails 1-5 b/l were debrided in length and girth. 2. Offending nail border of left great toe debrided and curretaged. Border cleansed with alcohol. Light bleeding addressed with Lumicaine. Triple antibiotic ointment applied. No further treatment required by patient. 3. Patient to continue soft, supportive shoe gear 4. Patient to report any pedal injuries to medical professional immediately. 5. Follow up 3 months. Patient/POA to call should there be a concern in the interim.

## 2018-02-08 NOTE — Patient Instructions (Signed)
Diabetes and Foot Care Diabetes may cause you to have problems because of poor blood supply (circulation) to your feet and legs. This may cause the skin on your feet to become thinner, break easier, and heal more slowly. Your skin may become dry, and the skin may peel and crack. You may also have nerve damage in your legs and feet causing decreased feeling in them. You may not notice minor injuries to your feet that could lead to infections or more serious problems. Taking care of your feet is one of the most important things you can do for yourself. Follow these instructions at home:  Wear shoes at all times, even in the house. Do not go barefoot. Bare feet are easily injured.  Check your feet daily for blisters, cuts, and redness. If you cannot see the bottom of your feet, use a mirror or ask someone for help.  Wash your feet with warm water (do not use hot water) and mild soap. Then pat your feet and the areas between your toes until they are completely dry. Do not soak your feet as this can dry your skin.  Apply a moisturizing lotion or petroleum jelly (that does not contain alcohol and is unscented) to the skin on your feet and to dry, brittle toenails. Do not apply lotion between your toes.  Trim your toenails straight across. Do not dig under them or around the cuticle. File the edges of your nails with an emery board or nail file.  Do not cut corns or calluses or try to remove them with medicine.  Wear clean socks or stockings every day. Make sure they are not too tight. Do not wear knee-high stockings since they may decrease blood flow to your legs.  Wear shoes that fit properly and have enough cushioning. To break in new shoes, wear them for just a few hours a day. This prevents you from injuring your feet. Always look in your shoes before you put them on to be sure there are no objects inside.  Do not cross your legs. This may decrease the blood flow to your feet.  If you find a  minor scrape, cut, or break in the skin on your feet, keep it and the skin around it clean and dry. These areas may be cleansed with mild soap and water. Do not cleanse the area with peroxide, alcohol, or iodine.  When you remove an adhesive bandage, be sure not to damage the skin around it.  If you have a wound, look at it several times a day to make sure it is healing.  Do not use heating pads or hot water bottles. They may burn your skin. If you have lost feeling in your feet or legs, you may not know it is happening until it is too late.  Make sure your health care provider performs a complete foot exam at least annually or more often if you have foot problems. Report any cuts, sores, or bruises to your health care provider immediately. Contact a health care provider if:  You have an injury that is not healing.  You have cuts or breaks in the skin.  You have an ingrown nail.  You notice redness on your legs or feet.  You feel burning or tingling in your legs or feet.  You have pain or cramps in your legs and feet.  Your legs or feet are numb.  Your feet always feel cold. Get help right away if:  There is increasing   redness, swelling, or pain in or around a wound.  There is a red line that goes up your leg.  Pus is coming from a wound.  You develop a fever or as directed by your health care provider.  You notice a bad smell coming from an ulcer or wound. This information is not intended to replace advice given to you by your health care provider. Make sure you discuss any questions you have with your health care provider. Document Released: 02/19/2000 Document Revised: 07/30/2015 Document Reviewed: 07/31/2012 Elsevier Interactive Patient Education  2017 [employer]  Diabetic Neuropathy Diabetic neuropathy is a nerve disease or nerve damage that is caused by diabetes mellitus. About half of all people with diabetes mellitus have some form of nerve damage. Nerve damage  is more common in those who have had diabetes mellitus for many years and who generally have not had good control of their blood sugar (glucose) level. Diabetic neuropathy is a common complication of diabetes mellitus. There are three common types of diabetic neuropathy and a fourth type that is less common and less understood:  Peripheral neuropathy-This is the most common type of diabetic neuropathy. It causes damage to the nerves of the feet and legs first and then eventually the hands and arms. The damage affects the ability to sense touch.  Autonomic neuropathy-This type causes damage to the autonomic nervous system, which controls the following functions: ? Heartbeat. ? Body temperature. ? Blood pressure. ? Urination. ? Digestion. ? Sweating. ? Sexual function.  Focal neuropathy-Focal neuropathy can be painful and unpredictable and occurs most often in older adults with diabetes mellitus. It involves a specific nerve or one area and often comes on suddenly. It usually does not cause long-term problems.  Radiculoplexus neuropathy- Sometimes called lumbosacral radiculoplexus neuropathy, radiculoplexus neuropathy affects the nerves of the thighs, hips, buttocks, or legs. It is more common in people with type 2 diabetes mellitus and in older men. It is characterized by debilitating pain, weakness, and atrophy, usually in the thigh muscles.  What are the causes? The cause of peripheral, autonomic, and focal neuropathies is diabetes mellitus that is uncontrolled and high glucose levels. The cause of radiculoplexus neuropathy is unknown. However, it is thought to be caused by inflammation related to uncontrolled glucose levels. What are the signs or symptoms? Peripheral Neuropathy Peripheral neuropathy develops slowly over time. When the nerves of the feet and legs no longer work there may be:  Burning, stabbing, or aching pain in the legs or feet.  Inability to feel pressure or pain in your  feet. This can lead to: ? Thick calluses over pressure areas. ? Pressure sores. ? Ulcers.  Foot deformities.  Reduced ability to feel temperature changes.  Muscle weakness.  Autonomic Neuropathy The symptoms of autonomic neuropathy vary depending on which nerves are affected. Symptoms may include:  Problems with digestion, such as: ? Feeling sick to your stomach (nausea). ? Vomiting. ? Bloating. ? Constipation. ? Diarrhea. ? Abdominal pain.  Difficulty with urination. This occurs if you lose your ability to sense when your bladder is full. Problems include: ? Urine leakage (incontinence). ? Inability to empty your bladder completely (retention).  Rapid or irregular heartbeat (palpitations).  Blood pressure drops when you stand up (orthostatic hypotension). When you stand up you may feel: ? Dizzy. ? Weak. ? Faint.  In men, inability to attain and maintain an erection.  In women, vaginal dryness and problems with decreased sexual desire and arousal.  Problems with body temperature   regulation.  Increased or decreased sweating.  Focal Neuropathy  Abnormal eye movements or abnormal alignment of both eyes.  Weakness in the wrist.  Foot drop. This results in an inability to lift the foot properly and abnormal walking or foot movement.  Paralysis on one side of your face (Bell palsy).  Chest or abdominal pain. Radiculoplexus Neuropathy  Sudden, severe pain in your hip, thigh, or buttocks.  Weakness and wasting of thigh muscles.  Difficulty rising from a seated position.  Abdominal swelling.  Unexplained weight loss (usually more than 10 lb [4.5 kg]). How is this diagnosed? Peripheral Neuropathy Your senses may be tested. Sensory function testing can be done with:  A light touch using a monofilament.  A vibration with tuning fork.  A sharp sensation with a pin prick.  Other tests that can help diagnose neuropathy are:  Nerve conduction velocity. This  test checks the transmission of an electrical current through a nerve.  Electromyography. This shows how muscles respond to electrical signals transmitted by nearby nerves.  Quantitative sensory testing. This is used to assess how your nerves respond to vibrations and changes in temperature.  Autonomic Neuropathy Diagnosis is often based on reported symptoms. Tell your health care provider if you experience:  Dizziness.  Constipation.  Diarrhea.  Inappropriate urination or inability to urinate.  Inability to get or maintain an erection.  Tests that may be done include:  Electrocardiography or Holter monitor. These are tests that can help show problems with the heart rate or heart rhythm.  An X-ray exam may be done.  Focal Neuropathy Diagnosis is made based on your symptoms and what your health care provider finds during your exam. Other tests may be done. They may include:  Nerve conduction velocities. This checks the transmission of electrical current through a nerve.  Electromyography. This shows how muscles respond to electrical signals transmitted by nearby nerves.  Quantitative sensory testing. This test is used to assess how your nerves respond to vibration and changes in temperature.  Radiculoplexus Neuropathy  Often the first thing is to eliminate any other issue or problems that might be the cause, as there is no standard test for diagnosis.  X-ray exam of your spine and lumbar region.  Spinal tap to rule out cancer.  MRI to rule out other lesions. How is this treated? Once nerve damage occurs, it cannot be reversed. The goal of treatment is to keep the disease or nerve damage from getting worse and affecting more nerve fibers. Controlling your blood glucose level is the key. Most people with radiculoplexus neuropathy see at least a partial improvement over time. You will need to keep your blood glucose and HbA1c levels in the target range determined by your  health care provider. Things that help control blood glucose levels include:  Blood glucose monitoring.  Meal planning.  Physical activity.  Diabetes medicine.  Over time, maintaining lower blood glucose levels helps lessen symptoms. Sometimes, prescription pain medicine is needed. Follow these instructions at home:  Do not smoke.  Keep your blood glucose level in the range that you and your health care provider have determined acceptable for you.  Keep your blood pressure level in the range that you and your health care provider have determined acceptable for you.  Eat a well-balanced diet.  Be physically active every day. Include strength training and balance exercises.  Protect your feet. ? Check your feet every day for sores, cuts, blisters, or signs of infection. ? Wear padded socks   and supportive shoes. Use orthotic inserts, if necessary. ? Regularly check the insides of your shoes for worn spots. Make sure there are no rocks or other items inside your shoes before you put them on. Contact a health care provider if:  You have burning, stabbing, or aching pain in the legs or feet.  You are unable to feel pressure or pain in your feet.  You develop problems with digestion such as: ? Nausea. ? Vomiting. ? Bloating. ? Constipation. ? Diarrhea. ? Abdominal pain.  You have difficulty with urination, such as: ? Incontinence. ? Retention.  You have palpitations.  You develop orthostatic hypotension. When you stand up you may feel: ? Dizzy. ? Weak. ? Faint.  You cannot attain and maintain an erection (in men).  You have vaginal dryness and problems with decreased sexual desire and arousal (in women).  You have severe pain in your thighs, legs, or buttocks.  You have unexplained weight loss. This information is not intended to replace advice given to you by your health care provider. Make sure you discuss any questions you have with your health care  provider. Document Released: 05/02/2001 Document Revised: 07/30/2015 Document Reviewed: 08/02/2012 Elsevier Interactive Patient Education  2017 [employer]vier Inc.  

## 2018-02-12 ENCOUNTER — Encounter: Payer: Self-pay | Admitting: Podiatry

## 2018-03-02 ENCOUNTER — Ambulatory Visit: Payer: PPO | Admitting: Podiatry

## 2018-04-12 ENCOUNTER — Ambulatory Visit: Payer: PPO | Admitting: Podiatry

## 2018-05-08 ENCOUNTER — Ambulatory Visit: Payer: PPO | Admitting: Podiatry

## 2018-05-08 DIAGNOSIS — B351 Tinea unguium: Secondary | ICD-10-CM

## 2018-05-08 DIAGNOSIS — E1142 Type 2 diabetes mellitus with diabetic polyneuropathy: Secondary | ICD-10-CM

## 2018-05-08 DIAGNOSIS — M79675 Pain in left toe(s): Secondary | ICD-10-CM | POA: Diagnosis not present

## 2018-05-08 DIAGNOSIS — M79674 Pain in right toe(s): Secondary | ICD-10-CM

## 2018-05-08 NOTE — Patient Instructions (Signed)

## 2018-05-20 ENCOUNTER — Encounter: Payer: Self-pay | Admitting: Podiatry

## 2018-05-20 NOTE — Progress Notes (Signed)
Subjective: Jeffrey [name] presents with h/o diabetic neuropathy today with history of neuropathy with cc of painful, mycotic toenails.  Pain is aggravated when wearing enclosed shoe gear and relieved with periodic professional debridement.  [name], MD is his PCP. Last visit 10/31/2017.   Current Outpatient Medications:  .  atorvastatin (LIPITOR) 80 MG tablet, Take 80 mg by mouth at bedtime., Disp: , Rfl:  .  baclofen (LIORESAL) 10 MG tablet, Take 0.5 tablets (5 mg total) by mouth 3 (three) times daily., Disp: 60 each, Rfl: 3 .  Cholecalciferol (VITAMIN D3) 5000 UNITS TABS, Take 5,000 Units by mouth once a week., Disp: , Rfl:  .  clopidogrel (PLAVIX) 75 MG tablet, Take 1 tablet (75 mg total) by mouth daily., Disp: 30 tablet, Rfl: 6 .  fosinopril ([name]) 40 MG tablet, Take 40 mg by mouth daily., Disp: , Rfl:  .  furosemide (LASIX) 40 MG tablet, Take 1 tablet (40 mg total) by mouth daily., Disp: 90 tablet, Rfl: 3 .  insulin aspart (NOVOLOG FLEXPEN) 100 UNIT/ML FlexPen, Inject 5-6 Units into the skin as directed. 5 units with breakfast 6 units with supper, Disp: , Rfl:  .  insulin glargine (LANTUS) 100 UNIT/ML injection, Inject 0.19 mLs (19 Units total) into the skin at bedtime., Disp: 10 mL, Rfl:  .  LACTOBACILLUS PO, Take 1 capsule by mouth daily. [facility], Disp: , Rfl:  .  loratadine (CLARITIN) 10 MG tablet, Take 10 mg by mouth daily as needed. , Disp: , Rfl:  .  losartan (COZAAR) 50 MG tablet, Take 50 mg by mouth daily., Disp: , Rfl:  .  metoprolol tartrate (LOPRESSOR) 25 MG tablet, Take 1 tablet (25 mg total) by mouth 2 (two) times daily., Disp: , Rfl:  .  senna (SENOKOT) 8.6 MG tablet, Take 1 tablet by mouth daily as needed for constipation. Reported on 02/23/2015, Disp: , Rfl:  .  senna (SENOKOT) 8.6 MG tablet, Take by mouth., Disp: , Rfl:  .  sertraline (ZOLOFT) 25 MG tablet, Take 1 tablet (25 mg total) by mouth daily., Disp: 30 tablet, Rfl: 11  Allergies   Allergen Reactions  . Niacin Other (See Comments)    REACTION: intolerant,not allergic="flushing,hot flashes,turning red" REACTION: intolerant,not allergic="flushing,hot flashes,turning red"    Objective:  Vascular Examination: Capillary refill time <3 seconds x 10 digits.  Dorsalis pedis and Posterior tibial pulses present b/l.  Digital hair x 10 digits was absent.  Skin temperature gradient WNL b/l.  Dermatological Examination: Skin thin and atrophic b/l.  Toenails 1-5 b/l discolored, thick, dystrophic with subungual debris and pain with palpation to nailbeds due to thickness of nails.  Musculoskeletal: Muscle strength 5/5 to all muscle groups LLE.  RLE weakness secondary to CVA.  Neurological: Sensation with 10 gram monofilament is intact b/l.  Vibratory sensation intact b/l.  Assessment: 1. Painful onychomycosis toenails 1-5 b/l 2. NIDDM with neuropathy  Plan: 1. Toenails 1-5 b/l were debrided in length and girth without iatrogenic bleeding. 2. Patient to continue soft, supportive shoe gear 3. Patient to report any pedal injuries to medical professional  4. Follow up 3 months.  5. Patient/POA to call should there be a concern in the interim.

## 2018-06-05 ENCOUNTER — Ambulatory Visit (INDEPENDENT_AMBULATORY_CARE_PROVIDER_SITE_OTHER)
Admission: RE | Admit: 2018-06-05 | Discharge: 2018-06-05 | Disposition: A | Discharge status: Home / Self Care | Payer: PPO | Source: Ambulatory Visit | Attending: Family Medicine | Admitting: Family Medicine

## 2018-06-05 ENCOUNTER — Other Ambulatory Visit: Payer: Self-pay

## 2018-06-05 ENCOUNTER — Ambulatory Visit: Payer: Self-pay | Admitting: *Deleted

## 2018-06-05 ENCOUNTER — Ambulatory Visit (INDEPENDENT_AMBULATORY_CARE_PROVIDER_SITE_OTHER): Payer: PPO | Admitting: Family Medicine

## 2018-06-05 ENCOUNTER — Encounter: Payer: Self-pay | Admitting: Family Medicine

## 2018-06-05 ENCOUNTER — Ambulatory Visit (HOSPITAL_COMMUNITY): Payer: PPO

## 2018-06-05 VITALS — BP 140/66 | HR 59 | Temp 98.2°F | Ht 68.5 in

## 2018-06-05 DIAGNOSIS — W19XXXA Unspecified fall, initial encounter: Secondary | ICD-10-CM

## 2018-06-05 DIAGNOSIS — I69351 Hemiplegia and hemiparesis following cerebral infarction affecting right dominant side: Secondary | ICD-10-CM | POA: Diagnosis not present

## 2018-06-05 DIAGNOSIS — S59901A Unspecified injury of right elbow, initial encounter: Secondary | ICD-10-CM | POA: Diagnosis not present

## 2018-06-05 DIAGNOSIS — S8991XA Unspecified injury of right lower leg, initial encounter: Secondary | ICD-10-CM

## 2018-06-05 DIAGNOSIS — M25521 Pain in right elbow: Secondary | ICD-10-CM | POA: Diagnosis not present

## 2018-06-05 DIAGNOSIS — M79621 Pain in right upper arm: Secondary | ICD-10-CM | POA: Diagnosis not present

## 2018-06-05 DIAGNOSIS — S4991XA Unspecified injury of right shoulder and upper arm, initial encounter: Secondary | ICD-10-CM

## 2018-06-05 DIAGNOSIS — M25511 Pain in right shoulder: Secondary | ICD-10-CM | POA: Diagnosis not present

## 2018-06-05 NOTE — Assessment & Plan Note (Addendum)
Concern for elbow or humeral fracture after fall at home - check xrays today - I don't see obvious fracture. Will recommend conservative treatment with tylenol, ice/heat, rest. Update if not improving over time.

## 2018-06-05 NOTE — Progress Notes (Signed)
BP 140/66 (BP Location: Left Arm, Patient Position: Sitting, Cuff Size: Normal)   Pulse (!) 59   Temp 98.2 F (36.8 C) (Oral)   Ht 5' 8.5" (1.74 m)   SpO2 96%   BMI 25.94 kg/m    CC: fall with R arm injury.  Subjective:    Patient ID: Jeffrey [name], male    DOB: [birth year], 78 y.o.   MRN: [id number]  HPI: Jeffrey [name] is a 78 y.o. male presenting on 06/05/2018 for Fall (Pt fell at home in the bathroom on 06/02/18. Pt feels pain in right shoulder, elbow and hip. )   Wife ill at home - he has been trying to care for her.  Chronic R hemiparesis after CVA 2016.   DOI: 06/02/2018 Lost balance while getting something under the sink. Hit R hip, leg, elbow and arm into shoulder. Was on ground for about 20-30 min, able to pull himself onto commode then up. Predominant pain from R shoulder to elbow. Predominant pain in R leg from knee to lateral hip - now unable to bear weight on R leg - deterioration from prior.       Relevant past medical, surgical, family and social history reviewed and updated as indicated. Interim medical history since our last visit reviewed. Allergies and medications reviewed and updated. Outpatient Medications Prior to Visit  Medication Sig Dispense Refill  . atorvastatin (LIPITOR) 80 MG tablet Take 80 mg by mouth at bedtime.    . baclofen (LIORESAL) 10 MG tablet Take 0.5 tablets (5 mg total) by mouth 3 (three) times daily. 60 each 3  . Cholecalciferol (VITAMIN D3) 5000 UNITS TABS Take 5,000 Units by mouth once a week.    . clopidogrel (PLAVIX) 75 MG tablet Take 1 tablet (75 mg total) by mouth daily. 30 tablet 6  . fosinopril ([name]) 40 MG tablet Take 40 mg by mouth daily.    . furosemide (LASIX) 40 MG tablet Take 1 tablet (40 mg total) by mouth daily. 90 tablet 3  . insulin aspart (NOVOLOG FLEXPEN) 100 UNIT/ML FlexPen Inject 5-6 Units into the skin as directed. 5 units with breakfast 6 units with supper    . insulin glargine (LANTUS) 100 UNIT/ML injection  Inject 0.19 mLs (19 Units total) into the skin at bedtime. 10 mL   . LACTOBACILLUS PO Take 1 capsule by mouth daily. [facility] colon [facility]    . loratadine (CLARITIN) 10 MG tablet Take 10 mg by mouth daily as needed.     [name] losartan (COZAAR) 50 MG tablet Take 50 mg by mouth daily.    . metoprolol tartrate (LOPRESSOR) 25 MG tablet Take 1 tablet (25 mg total) by mouth 2 (two) times daily.    [name] senna (SENOKOT) 8.6 MG tablet Take 1 tablet by mouth daily as needed for constipation. Reported on 02/23/2015    . senna (SENOKOT) 8.6 MG tablet Take by mouth.    . sertraline (ZOLOFT) 25 MG tablet Take 1 tablet (25 mg total) by mouth daily. 30 tablet 11   No facility-administered medications prior to visit.      Per HPI unless specifically indicated in ROS section below Review of Systems Objective:    BP 140/66 (BP Location: Left Arm, Patient Position: Sitting, Cuff Size: Normal)   Pulse (!) 59   Temp 98.2 F (36.8 C) (Oral)   Ht 5' 8.5" (1.74 m)   SpO2 96%   BMI 25.94 kg/m   Wt Readings from Last 3 Encounters:  10/31/17  173 lb 2 oz (78.5 kg)  10/31/17 173 lb 2 oz (78.5 kg)  07/25/17 180 lb 12.8 oz (82 kg)    Physical Exam Vitals signs and nursing note reviewed.  Constitutional:      Appearance: Normal appearance. He is not ill-appearing.  Musculoskeletal:        General: Tenderness present.     Comments:  L shoulder/arm WNL R shoulder: chronic limited ROM due to hemiparesis, but acutely worse due to pain. Difficult exam due to pain and prior weakness R elbow: tender to palpation lateral upper arm just proximal to elbow, no pain at epicondyles or olecranon. Limited ROM in flexion due to pain  L leg - WNL R leg - known chronic hemiparesis R Hip: No pain with int/ext rotation at hip.  R thigh: no reproducible pain to palpation along entire leg R knee: FROM flexion/extension of knee without crepitus, mild discomfort at full extension, no popliteal fullness  Back: No pain midline spine  No paraspinous mm tenderness Neg seated SLR on right No pain at SIJ, GTB or sciatic notch bilaterally.   Skin:    Findings: Bruising (R inner upper arm near elbow) present.  Neurological:     Mental Status: He is alert.     Comments: Chronic R hemiparesis. Able to bear weight on both legs       Results for orders placed or performed in visit on 02/04/18  Hemoglobin A1c  Result Value Ref Range   Hgb A1c MFr Bld 8.7 (A) 4.0 - 6.0   Assessment & Plan:   Problem List Items Addressed This Visit    Injury of right leg    Overall benign exam. Anticipate leg strain. Will watch for now. Supportive care recommended - heat, ice to leg. Update if not improving as expected.       Hemiparesis affecting right side as late effect of cerebrovascular accident ([place])   Arm injury, right, initial encounter - Primary    Concern for elbow or humeral fracture after fall at home - check xrays today - I don't see obvious fracture. Will recommend conservative treatment with tylenol, ice/heat, rest. Update if not improving over time.       Relevant Orders   DG Elbow Complete Right (Completed)   DG Humerus Right (Completed)   DG Shoulder Right    Other Visit Diagnoses    Fall with injury, initial encounter           No orders of the defined types were placed in this encounter.  Orders Placed This Encounter  Procedures  . DG Elbow Complete Right    Standing Status:   Future    Number of Occurrences:   1    Standing Expiration Date:   08/05/2019    Order Specific Question:   Reason for Exam (SYMPTOM  OR DIAGNOSIS REQUIRED)    Answer:   R elbow pain after fall    Order Specific Question:   Preferred imaging location?    Answer:   [facility]    Order Specific Question:   Radiology Contrast Protocol - do NOT remove file path    Answer:   \\[email or web address]  . DG Humerus Right    Standing Status:   Future    Number of Occurrences:   1    Standing  Expiration Date:   08/05/2019    Order Specific Question:   Reason for Exam (SYMPTOM  OR DIAGNOSIS REQUIRED)    Answer:   R arm pain  after fall    Order Specific Question:   Preferred imaging location?    Answer:   [facility]    Order Specific Question:   Radiology Contrast Protocol - do NOT remove file path    Answer:   \\[email or web address]  . DG Shoulder Right    Standing Status:   Future    Number of Occurrences:   1    Standing Expiration Date:   08/05/2019    Order Specific Question:   Reason for Exam (SYMPTOM  OR DIAGNOSIS REQUIRED)    Answer:   R arm pain after fall    Order Specific Question:   Preferred imaging location?    Answer:   [facility]    Order Specific Question:   Radiology Contrast Protocol - do NOT remove file path    Answer:   \\[email or web address]    Follow up plan: No follow-ups on file.  [name], MD

## 2018-06-05 NOTE — Assessment & Plan Note (Addendum)
Overall benign exam. Anticipate leg strain. Will watch for now. Supportive care recommended - heat, ice to leg. Update if not improving as expected.

## 2018-06-05 NOTE — Telephone Encounter (Signed)
Spoke to patient's wife and was advised that the person bringing him has not travelled, no fever or respiratory symptoms. Mrs,. Alcantar stated that no one in their home has travelled, no fever and no respiratory symptoms. Mrs. Nawrot stated that they will be wearing mask and gloves for their protection.

## 2018-06-05 NOTE — Telephone Encounter (Signed)
Patient's daughter is calling to report patient fell out of wheel chair this week end and has injured arm. Patient has swelling and bruising on R arm and their is some concern that he may need evaluation of injury. Patient and care giver have no respiratory symptoms at this time.  Reason for Disposition . Large swelling or bruise (> 2 inches or 5 cm)  Answer Assessment - Initial Assessment Questions 1. MECHANISM: "How did the injury happen?"     [place] out of wheel chair trying to help wife 2. ONSET: "When did the injury happen?" (Minutes or hours ago)      Sunday morning- patient was able to get himself up 3. LOCATION: "Where is the injury located?"      Right arm 4. APPEARANCE of INJURY: "What does the injury look like?"      R arm has swelling- bicep has bruise 5. SEVERITY: "Can you use the arm normally?"      That side of body is the side of the body that has been effected by stroke- so movement is limited 6. SWELLING or BRUISING: "is there any swelling or bruising?" If so, ask: "How large is it? (e.g., inches, centimeters)      Swelling- whole arm-hand and arm, bruising at bicep 7. PAIN: "Is there pain?" If so, ask: "How bad is the pain?"    (Scale 1-10; or mild, moderate, severe)     Patient has nerve damage- but he is favoring the arm 8. TETANUS: For any breaks in the skin, ask: "When was the last tetanus booster?"     n/a 9. OTHER SYMPTOMS: "Do you have any other symptoms?"  (e.g., numbness in hand)     No other symptoms 10. PREGNANCY: "Is there any chance you are pregnant?" "When was your last menstrual period?"       n/a  Protocols used: ARM INJURY-A-AH

## 2018-06-05 NOTE — Patient Instructions (Addendum)
May use heating pad or ice to leg - not directly on skin, covered with towel, no more than 10-15 min at a time. This should improve over time.  Xrays of R arm and shoulder done today - overall ok - one spot I want to see what radiologist thinks but doubt new fracture.  Continue tylenol use as needed for pain, may use heating pad to arm as well.  Let us know if not improving over time.

## 2018-06-05 NOTE — Telephone Encounter (Signed)
Agree. Thanks

## 2018-07-25 ENCOUNTER — Ambulatory Visit: Payer: PPO | Admitting: Cardiology

## 2018-07-27 ENCOUNTER — Telehealth (INDEPENDENT_AMBULATORY_CARE_PROVIDER_SITE_OTHER): Payer: PPO | Admitting: Cardiology

## 2018-07-27 ENCOUNTER — Encounter: Payer: Self-pay | Admitting: Cardiology

## 2018-07-27 ENCOUNTER — Telehealth: Payer: Self-pay

## 2018-07-27 ENCOUNTER — Other Ambulatory Visit: Payer: Self-pay

## 2018-07-27 VITALS — Ht 68.5 in | Wt 170.0 lb

## 2018-07-27 DIAGNOSIS — I2510 Atherosclerotic heart disease of native coronary artery without angina pectoris: Secondary | ICD-10-CM | POA: Diagnosis not present

## 2018-07-27 DIAGNOSIS — E119 Type 2 diabetes mellitus without complications: Secondary | ICD-10-CM

## 2018-07-27 DIAGNOSIS — I10 Essential (primary) hypertension: Secondary | ICD-10-CM

## 2018-07-27 DIAGNOSIS — I69351 Hemiplegia and hemiparesis following cerebral infarction affecting right dominant side: Secondary | ICD-10-CM

## 2018-07-27 NOTE — Progress Notes (Signed)
Virtual Visit via Telephone Note   This visit type was conducted due to national recommendations for restrictions regarding the COVID-19 Pandemic (e.g. social distancing) in an effort to limit this patient's exposure and mitigate transmission in our community.  Due to his co-morbid illnesses, this patient is at least at moderate risk for complications without adequate follow up.  This format is felt to be most appropriate for this patient at this time.  The patient did not have access to video technology/had technical difficulties with video requiring transitioning to audio format only (telephone).  All issues noted in this document were discussed and addressed.  No physical exam could be performed with this format.  Please refer to the patient's chart for his  consent to telehealth for [facility].   Date:  07/27/2018   ID:  SAINTCLAIR [name], DOB [birth year], MRN [id number]  Patient Location: Home Provider Location: Home  PCP:  [name], MD  Cardiologist:  Candee Furbish, MD  Electrophysiologist:  None   Evaluation Performed:  Follow-Up Visit  Chief Complaint: Coronary artery disease follow-up  History of Present Illness:    Jeffrey [name] is a 78 y.o. male with with coronary artery disease here for follow-up.  Prior patient of Jeffrey [name].  Previously noted that he had PCI but he thinks he may have had PTCA only.  Do not have formal report.  Normal EF.  In March 2016 he had a stroke with hemorrhagic transformation.  Right-sided hemiparesis with episode of atrial fibrillation.  Previously had been on Plavix.  He was felt to be at increased risk for anticoagulation given hemorrhagic transformation at that time.  He is a retired Naval architect, had prior PT for ataxia.  Denies any chest pain fevers chills nausea vomiting syncope bleeding. No issues. Weight 170 stable. No palpations.  Did have fall helping his wife. Jeffrey [name] place is wife currently resides (SNF)   The patient  does not have symptoms concerning for COVID-19 infection (fever, chills, cough, or new shortness of breath).    Past Medical History:  Diagnosis Date  . Anterior cerebral circulation hemorrhagic infarction [facility]) 11/24/2014   March, 2016, dominant left thalamic and left internal capsule ischemic infarct with resultant hemorrhagic transformation, secondary to small vessel disease. Resulted in right hemiparesis dysarthria and diplopia   //   readmission with aphasia July, 2016, this improved   . Arthritis   . CAD (coronary artery disease)    PCI distal RCA ...2004, residual 70% LAD   /   ...nuclear...03/2007...no ischemia.Marland KitchenMarland Kitchenpreserved LV /  nuclear...03/03/2009...inferior scar..no ischemia..EF 51%  . Cyst of nasopharynx    per ENT Wilburn Cornelia  . Dyslipidemia    takes Atorvastatin daily  . GERD (gastroesophageal reflux disease)    takes Omeprazole daily  . Gout    takes Allopurinol daily and Colchicine as needed  . HCAP (healthcare-associated pneumonia)   . Hemiparesis affecting right side as late effect of cerebrovascular accident ([place]) 09/05/2014  . History of colon polyps   . HTN (hypertension)    takes Cardura,Metoprolol,[name],and Amlodipine daily  . Internal hemorrhoids   . Myocardial infarction [facility]) 36yrs ago  . Peripheral edema    takes Lasix daily  . 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 Right carotid bruit   . Seasonal allergic rhinitis   . Stroke (Jeffrey [name])   . Type 2 diabetes, uncontrolled, with neuropathy (Jeffrey [name]) 1989   takes Invokana daily and has an insulin pump ([place]rey [name]  [place])   Past Surgical History:  Procedure Laterality Date  . BALLOON ANGIOPLASTY, ARTERY  1992, 2004   CAD, Jeffrey [name]  . CARDIAC CATHETERIZATION  2004  . CATARACT EXTRACTION Bilateral 2010  . COLONOSCOPY  04/03/2002   adenomatous polyp, int hemorrhoids  . COLONOSCOPY  07/18/2007   normal (Dr. Fuller Plan)  . COLONOSCOPY  09/26/2012   tubular adenoma, sm int hem, rpt 5  yrs Fuller Plan)  . ELBOW SURGERY Right 1997  . eye lids raised    . NASAL SEPTUM SURGERY    . POLYPECTOMY N/A 05/27/2013   Procedure: ENDOSCOPIC NASOPHARYNGEAL MASS;  Surgeon: Jeffrey Belfast, MD  . Ali Chuk  09/2011   left, with subacromial decompression  . TONSILLECTOMY AND ADENOIDECTOMY       Current Meds  Medication Sig  . atorvastatin (LIPITOR) 80 MG tablet Take 80 mg by mouth at bedtime.  . baclofen (LIORESAL) 10 MG tablet Take 0.5 tablets (5 mg total) by mouth 3 (three) times daily.  . Cholecalciferol (VITAMIN D3) 5000 UNITS TABS Take 5,000 Units by mouth once a week.  . clopidogrel (PLAVIX) 75 MG tablet Take 1 tablet (75 mg total) by mouth daily.  . fosinopril ([name]) 40 MG tablet Take 40 mg by mouth daily.  . furosemide (LASIX) 40 MG tablet Take 1 tablet (40 mg total) by mouth daily.  . insulin aspart (NOVOLOG FLEXPEN) 100 UNIT/ML FlexPen Inject 5-6 Units into the skin as directed. 5 units with breakfast 6 units with supper  . insulin glargine (LANTUS) 100 UNIT/ML injection Inject 0.19 mLs (19 Units total) into the skin at bedtime.  [name] LACTOBACILLUS PO Take 1 capsule by mouth daily. [facility]  . loratadine (CLARITIN) 10 MG tablet Take 10 mg by mouth daily as needed.   [name] losartan (COZAAR) 50 MG tablet Take 50 mg by mouth daily.  . metoprolol tartrate (LOPRESSOR) 25 MG tablet Take 1 tablet (25 mg total) by mouth 2 (two) times daily.  [name] senna (SENOKOT) 8.6 MG tablet Take 1 tablet by mouth daily as needed for constipation. Reported on 02/23/2015  . senna (SENOKOT) 8.6 MG tablet Take by mouth.  . sertraline (ZOLOFT) 25 MG tablet Take 1 tablet (25 mg total) by mouth daily.     Allergies:   Niacin   Social History   Tobacco Use  . Smoking status: Former Research scientist (life sciences)  . Smokeless tobacco: Never Used  . Tobacco comment: quit smoking 45 years ago.  Substance Use Topics  . Alcohol use: No  . Drug use: No     Family Hx: The patient's family history includes  Alcohol abuse in his father; Heart attack in his father and son. There is no history of Coronary artery disease, Stroke, Cancer, Diabetes, or Colon cancer.  ROS:   Please see the history of present illness.     All other systems reviewed and are negative.   Prior CV studies:   The following studies were reviewed today:  ECHO: 06/09/14: - Left ventricle: The cavity size was normal. Wall thickness was normal. Systolic function was normal. The estimated ejection fraction was in the range of 55% to 60%. Regional wall motion abnormalities cannot be excluded. Doppler parameters are consistent with abnormal left ventricular relaxation (grade 1 diastolic dysfunction).  Labs/Other Tests and Data Reviewed:    EKG:  An ECG dated 07/25/17 was personally reviewed today and demonstrated:  Sinus bradycardia rate 59 no other significant abnormalities  Recent Labs: 10/31/2017: ALT 32; BUN 12; Creatinine, Ser 0.90;  Hemoglobin 14.9; Platelets 161.0; Potassium 3.9; Sodium 139   Recent Lipid Panel Lab Results  Component Value Date/Time   CHOL 135 10/31/2017 12:50 PM   CHOL 211 02/01/2011   TRIG 239.0 (H) 10/31/2017 12:50 PM   TRIG 342 09/30/2013   TRIG 342 09/30/2013   HDL 32.80 (L) 10/31/2017 12:50 PM   CHOLHDL 4 10/31/2017 12:50 PM   LDLCALC 114 07/26/2017   LDLCALC 98 09/30/2013   LDLCALC 98 09/30/2013   LDLDIRECT 67.0 10/31/2017 12:50 PM    Wt Readings from Last 3 Encounters:  07/27/18 170 lb (77.1 kg)  10/31/17 173 lb 2 oz (78.5 kg)  10/31/17 173 lb 2 oz (78.5 kg)     Objective:    Vital Signs:  Ht 5' 8.5" (1.74 m)   Wt 170 lb (77.1 kg)   BMI 25.47 kg/m  No cuff.   VITAL SIGNS:  reviewed  Blood pressure has been slightly elevated in the past.  Speaking in full sentences without any difficulty, normal respiratory effort, pleasant, alert  ASSESSMENT & PLAN:    Coronary artery disease status post PCI/PTCA distal RCA in 2004 - 2010 nuclear stress test showed no  ischemia, demonstrated prior inferior scar. -EF normal April 2016 echo. -Doing well, good overall blood pressure control, statin. -No further evidence of atrial fibrillation.  Prior stroke 2016 with hemorrhagic transformation with right-sided hemiparesis - Now on Plavix, no bleeding.  Prior notes were reviewed from Dr. Leonie Man.  Secondary prevention.  Diabetes with hypertension and polyneuropathy - Continuing to monitor.  Medications reviewed. - Jeffrey [name] has been following.  Triglycerides have been as high as 500 in the past.  Recently have been 191. -I have asked him to consider getting a home blood pressure cuff to periodically check his blood pressure.  Hyperlipidemia - Prior LDL 65 on high intensity atorvastatin.  No myalgias.    COVID-19 Education: The signs and symptoms of COVID-19 were discussed with the patient and how to seek care for testing (follow up with PCP or arrange E-visit).  The importance of social distancing was discussed today.  Time:   Today, I have spent 11 minutes with the patient with telehealth technology discussing the above problems.     Medication Adjustments/Labs and Tests Ordered: Current medicines are reviewed at length with the patient today.  Concerns regarding medicines are outlined above.   Tests Ordered: No orders of the defined types were placed in this encounter.   Medication Changes: No orders of the defined types were placed in this encounter.   Disposition:  Follow up in 1 year(s)  Signed, Candee Furbish, MD  07/27/2018 9:39 AM    [facility]

## 2018-07-27 NOTE — Patient Instructions (Signed)
Medication Instructions:  Your physician recommends that you continue on your current medications as directed. Please refer to the Current Medication list given to you today.   If you need a refill on your cardiac medications before your next appointment, please call your pharmacy.   Lab work: None If you have labs (blood work) drawn today and your tests are completely normal, you will receive your results only by: . MyChart Message (if you have MyChart) OR . A paper copy in the mail If you have any lab test that is abnormal or we need to change your treatment, we will call you to review the results.  Testing/Procedures: None  Follow-Up: At [facility], you and your health needs are our priority.  As part of our continuing mission to provide you with exceptional heart care, we have created designated Provider Care Teams.  These Care Teams include your primary Cardiologist (physician) and Advanced Practice Providers (APPs -  Physician Assistants and Nurse Practitioners) who all work together to provide you with the care you need, when you need it. You will need a follow up appointment in 12 months.  Please call our office 2 months in advance to schedule this appointment.  You may see Mark Skains, MD or one of the following Advanced Practice Providers on your designated Care Team:   Lori Gerhardt, NP Laura Ingold, NP . Jill McDaniel, NP  Any Other Special Instructions Will Be Listed Below (If Applicable).    

## 2018-07-27 NOTE — Telephone Encounter (Signed)
YOUR CARDIOLOGY TEAM HAS ARRANGED FOR AN E-VISIT FOR YOUR APPOINTMENT - PLEASE REVIEW IMPORTANT INFORMATION BELOW SEVERAL DAYS PRIOR TO YOUR APPOINTMENT  Due to the recent COVID-19 pandemic, we are transitioning in-person office visits to tele-medicine visits in an effort to decrease unnecessary exposure to our patients, their families, and staff. These visits are billed to your insurance just like a normal visit is. We also encourage you to sign up for MyChart if you have not already done so. You will need a smartphone if possible. For patients that do not have this, we can still complete the visit using a regular telephone but do prefer a smartphone to enable video when possible. You may have a family member that lives with you that can help. If possible, we also ask that you have a blood pressure cuff and scale at home to measure your blood pressure, heart rate and weight prior to your scheduled appointment. Patients with clinical needs that need an in-person evaluation and testing will still be able to come to the office if absolutely necessary. If you have any questions, feel free to call our office.     YOUR PROVIDER WILL BE USING THE FOLLOWING PLATFORM TO COMPLETE YOUR VISIT: [email or web address]  . IF USING MYCHART - How to Download the MyChart App to Your SmartPhone   - If Apple, go to App Store and type in MyChart in the search bar and download the app. If Android, ask patient to go to Google Play Store and type in MyChart in the search bar and download the app. The app is free but as with any other app downloads, your phone may require you to verify saved payment information or Apple/Android password.  - You will need to then log into the app with your MyChart username and password, and select [facility] as your healthcare provider to link the account.  - When it is time for your visit, go to the MyChart app, find appointments, and click Begin Video Visit. Be sure to Select Allow for your device to  access the Microphone and Camera for your visit. You will then be connected, and your provider will be with you shortly.  **If you have any issues connecting or need assistance, please contact MyChart service desk [phone] ([phone])**  **If using a computer, in order to ensure the best quality for your visit, you will need to use either of the following Internet Browsers: Google Chrome or Microsoft Edge**  . IF USING DOXIMITY or [email or web address] - The staff will give you instructions on receiving your link to join the meeting the day of your visit.      2-3 DAYS BEFORE YOUR APPOINTMENT  You will receive a telephone call from one of our Heart[facility] team members - your caller ID may say "Unknown caller." If this is a video visit, we will walk you through how to get the video launched on your phone. We will remind you check your blood pressure, heart rate and weight prior to your scheduled appointment. If you have an Apple Watch or Kardia, please upload any pertinent ECG strips the day before or morning of your appointment to MyChart. Our staff will also make sure you have reviewed the consent and agree to move forward with your scheduled tele-health visit.     THE DAY OF YOUR APPOINTMENT  Approximately 15 minutes prior to your scheduled appointment, you will receive a telephone call from one of HeartCare team - your caller ID may say "Unknown caller."    Our staff will confirm medications, vital signs for the day and any symptoms you may be experiencing. Please have this information available prior to the time of visit start. It may also be helpful for you to have a pad of paper and pen handy for any instructions given during your visit. They will also walk you through joining the smartphone meeting if this is a video visit.    CONSENT FOR TELE-HEALTH VISIT - PLEASE REVIEW  I hereby voluntarily request, consent and authorize [facility] and its employed or contracted physicians, physician  assistants, nurse practitioners or other licensed health care professionals (the Practitioner), to provide me with telemedicine health care services (the "Services") as deemed necessary by the treating Practitioner. I acknowledge and consent to receive the Services by the Practitioner via telemedicine. I understand that the telemedicine visit will involve communicating with the Practitioner through live audiovisual communication technology and the disclosure of certain medical information by electronic transmission. I acknowledge that I have been given the opportunity to request an in-person assessment or other available alternative prior to the telemedicine visit and am voluntarily participating in the telemedicine visit.  I understand that I have the right to withhold or withdraw my consent to the use of telemedicine in the course of my care at any time, without affecting my right to future care or treatment, and that the Practitioner or I may terminate the telemedicine visit at any time. I understand that I have the right to inspect all information obtained and/or recorded in the course of the telemedicine visit and may receive copies of available information for a reasonable fee.  I understand that some of the potential risks of receiving the Services via telemedicine include:  . Delay or interruption in medical evaluation due to technological equipment failure or disruption; . Information transmitted may not be sufficient (e.g. poor resolution of images) to allow for appropriate medical decision making by the Practitioner; and/or  . In rare instances, security protocols could fail, causing a breach of personal health information.  Furthermore, I acknowledge that it is my responsibility to provide information about my medical history, conditions and care that is complete and accurate to the best of my ability. I acknowledge that Practitioner's advice, recommendations, and/or decision may be based on  factors not within their control, such as incomplete or inaccurate data provided by me or distortions of diagnostic images or specimens that may result from electronic transmissions. I understand that the practice of medicine is not an exact science and that Practitioner makes no warranties or guarantees regarding treatment outcomes. I acknowledge that I will receive a copy of this consent concurrently upon execution via email to the email address I last provided but may also request a printed copy by calling the office of [facility].    I understand that my insurance will be billed for this visit.   I have read or had this consent read to me. . I understand the contents of this consent, which adequately explains the benefits and risks of the Services being provided via telemedicine.  . I have been provided ample opportunity to ask questions regarding this consent and the Services and have had my questions answered to my satisfaction. . I give my informed consent for the services to be provided through the use of telemedicine in my medical care  By participating in this telemedicine visit I agree to the above.  

## 2018-08-07 ENCOUNTER — Other Ambulatory Visit: Payer: Self-pay

## 2018-08-07 ENCOUNTER — Encounter: Payer: Self-pay | Admitting: Podiatry

## 2018-08-07 ENCOUNTER — Ambulatory Visit: Payer: PPO | Admitting: Podiatry

## 2018-08-07 VITALS — Temp 97.7°F

## 2018-08-07 DIAGNOSIS — M79674 Pain in right toe(s): Secondary | ICD-10-CM

## 2018-08-07 DIAGNOSIS — E1142 Type 2 diabetes mellitus with diabetic polyneuropathy: Secondary | ICD-10-CM

## 2018-08-07 DIAGNOSIS — M79675 Pain in left toe(s): Secondary | ICD-10-CM | POA: Diagnosis not present

## 2018-08-07 DIAGNOSIS — B351 Tinea unguium: Secondary | ICD-10-CM

## 2018-08-07 NOTE — Patient Instructions (Signed)
Diabetes Mellitus and Foot Care  Foot care is an important part of your health, especially when you have diabetes. Diabetes may cause you to have problems because of poor blood flow (circulation) to your feet and legs, which can cause your skin to:   Become thinner and drier.   Break more easily.   Heal more slowly.   Peel and crack.  You may also have nerve damage (neuropathy) in your legs and feet, causing decreased feeling in them. This means that you may not notice minor injuries to your feet that could lead to more serious problems. Noticing and addressing any potential problems early is the best way to prevent future foot problems.  How to care for your feet  Foot hygiene   Wash your feet daily with warm water and mild soap. Do not use hot water. Then, pat your feet and the areas between your toes until they are completely dry. Do not soak your feet as this can dry your skin.   Trim your toenails straight across. Do not dig under them or around the cuticle. File the edges of your nails with an emery board or nail file.   Apply a moisturizing lotion or petroleum jelly to the skin on your feet and to dry, brittle toenails. Use lotion that does not contain alcohol and is unscented. Do not apply lotion between your toes.  Shoes and socks   Wear clean socks or stockings every day. Make sure they are not too tight. Do not wear knee-high stockings since they may decrease blood flow to your legs.   Wear shoes that fit properly and have enough cushioning. Always look in your shoes before you put them on to be sure there are no objects inside.   To break in new shoes, wear them for just a few hours a day. This prevents injuries on your feet.  Wounds, scrapes, corns, and calluses   Check your feet daily for blisters, cuts, bruises, sores, and redness. If you cannot see the bottom of your feet, use a mirror or ask someone for help.   Do not cut corns or calluses or try to remove them with medicine.   If you  find a minor scrape, cut, or break in the skin on your feet, keep it and the skin around it clean and dry. You may clean these areas with mild soap and water. Do not clean the area with peroxide, alcohol, or iodine.   If you have a wound, scrape, corn, or callus on your foot, look at it several times a day to make sure it is healing and not infected. Check for:  ? Redness, swelling, or pain.  ? Fluid or blood.  ? Warmth.  ? Pus or a bad smell.  General instructions   Do not cross your legs. This may decrease blood flow to your feet.   Do not use heating pads or hot water bottles on your feet. They may burn your skin. If you have lost feeling in your feet or legs, you may not know this is happening until it is too late.   Protect your feet from hot and cold by wearing shoes, such as at the beach or on hot pavement.   Schedule a complete foot exam at least once a year (annually) or more often if you have foot problems. If you have foot problems, report any cuts, sores, or bruises to your health care provider immediately.  Contact a health care provider if:     You have a medical condition that increases your risk of infection and you have any cuts, sores, or bruises on your feet.   You have an injury that is not healing.   You have redness on your legs or feet.   You feel burning or tingling in your legs or feet.   You have pain or cramps in your legs and feet.   Your legs or feet are numb.   Your feet always feel cold.   You have pain around a toenail.  Get help right away if:   You have a wound, scrape, corn, or callus on your foot and:  ? You have pain, swelling, or redness that gets worse.  ? You have fluid or blood coming from the wound, scrape, corn, or callus.  ? Your wound, scrape, corn, or callus feels warm to the touch.  ? You have pus or a bad smell coming from the wound, scrape, corn, or callus.  ? You have a fever.  ? You have a red line going up your leg.  Summary   Check your feet every day  for cuts, sores, red spots, swelling, and blisters.   Moisturize feet and legs daily.   Wear shoes that fit properly and have enough cushioning.   If you have foot problems, report any cuts, sores, or bruises to your health care provider immediately.   Schedule a complete foot exam at least once a year (annually) or more often if you have foot problems.  This information is not intended to replace advice given to you by your health care provider. Make sure you discuss any questions you have with your health care provider.  Document Released: 02/19/2000 Document Revised: 04/05/2017 Document Reviewed: 03/25/2016  Elsevier Interactive Patient Education  2019 [employer]

## 2018-08-11 NOTE — Progress Notes (Signed)
Subjective:  Jeffrey [name] presents to clinic today with cc of  painful, thick, discolored, elongated toenails 1-5 b/l that become tender and cannot cut because of thickness.  Pain is aggravated when wearing enclosed shoe gear.  He is diabetic and also on Plavix.  [name], MD is his PCP and last visit was 05/08/2018.   Current Outpatient Medications:  .  atorvastatin (LIPITOR) 80 MG tablet, Take 80 mg by mouth at bedtime., Disp: , Rfl:  .  baclofen (LIORESAL) 10 MG tablet, Take 0.5 tablets (5 mg total) by mouth 3 (three) times daily., Disp: 60 each, Rfl: 3 .  Cholecalciferol (VITAMIN D3) 5000 UNITS TABS, Take 5,000 Units by mouth once a week., Disp: , Rfl:  .  clopidogrel (PLAVIX) 75 MG tablet, Take 1 tablet (75 mg total) by mouth daily., Disp: 30 tablet, Rfl: 6 .  fosinopril ([name]) 40 MG tablet, Take 40 mg by mouth daily., Disp: , Rfl:  .  furosemide (LASIX) 40 MG tablet, Take 1 tablet (40 mg total) by mouth daily., Disp: 90 tablet, Rfl: 3 .  insulin aspart (NOVOLOG FLEXPEN) 100 UNIT/ML FlexPen, Inject 5-6 Units into the skin as directed. 5 units with breakfast 6 units with supper, Disp: , Rfl:  .  insulin glargine (LANTUS) 100 UNIT/ML injection, Inject 0.19 mLs (19 Units total) into the skin at bedtime., Disp: 10 mL, Rfl:  .  LACTOBACILLUS PO, Take 1 capsule by mouth daily. [facility], Disp: , Rfl:  .  loratadine (CLARITIN) 10 MG tablet, Take 10 mg by mouth daily as needed. , Disp: , Rfl:  .  losartan (COZAAR) 50 MG tablet, Take 50 mg by mouth daily., Disp: , Rfl:  .  metoprolol tartrate (LOPRESSOR) 25 MG tablet, Take 1 tablet (25 mg total) by mouth 2 (two) times daily., Disp: , Rfl:  .  senna (SENOKOT) 8.6 MG tablet, Take 1 tablet by mouth daily as needed for constipation. Reported on 02/23/2015, Disp: , Rfl:  .  senna (SENOKOT) 8.6 MG tablet, Take by mouth., Disp: , Rfl:  .  sertraline (ZOLOFT) 25 MG tablet, Take 1 tablet (25 mg total) by mouth daily., Disp: 30  tablet, Rfl: 11   Allergies  Allergen Reactions  . Niacin Other (See Comments)    REACTION: intolerant,not allergic="flushing,hot flashes,turning red" REACTION: intolerant,not allergic="flushing,hot flashes,turning red"     Objective: Vitals:   08/07/18 1016  Temp: 97.7 F (36.5 C)    Physical Examination:  Vascular Examination: Capillary refill time <3 seconds x 10 digits.  Palpable DP/PT pulses b/l.  Digital hair absent b/l.  No edema noted b/l.  Skin temperature gradient WNL b/l.  Dermatological Examination: Pedal skin thin and atrophic b/l.   No open wounds b/l.  No interdigital macerations noted b/l.  Elongated, thick, discolored brittle toenails with subungual debris and pain on dorsal palpation of nailbeds 1-5 b/l.  Musculoskeletal Examination: Muscle strength 5/5 to all muscle groups left LE.  RLE weakness secondary to CVA.  Neurological Examination: Sensation intact diminished with 10 gram monofilament.  Vibratory sensation intact b/l.  Proprioceptive sensation intact b/l.  Assessment: Mycotic nail infection with pain 1-5 b/l NIDDM with neuropathy in patient on long term blood thinner   Plan: 1. Toenails 1-5 b/l were debrided in length and girth without iatrogenic laceration. 2. Avoid self trimming due to blood thinner use. 3. Continue soft, supportive shoe gear daily. 4. Report any pedal injuries to medical professional. 5. Follow up 3 months. 6. Patient/POA to call should  there be a question/concern in there interim.

## 2018-11-06 ENCOUNTER — Ambulatory Visit: Payer: PPO | Admitting: Podiatry

## 2018-11-06 ENCOUNTER — Encounter: Payer: Self-pay | Admitting: Podiatry

## 2018-11-06 ENCOUNTER — Other Ambulatory Visit: Payer: Self-pay

## 2018-11-06 DIAGNOSIS — M79674 Pain in right toe(s): Secondary | ICD-10-CM

## 2018-11-06 DIAGNOSIS — E1142 Type 2 diabetes mellitus with diabetic polyneuropathy: Secondary | ICD-10-CM

## 2018-11-06 DIAGNOSIS — B351 Tinea unguium: Secondary | ICD-10-CM

## 2018-11-06 DIAGNOSIS — M79675 Pain in left toe(s): Secondary | ICD-10-CM | POA: Diagnosis not present

## 2018-11-06 NOTE — Patient Instructions (Signed)
Diabetes Mellitus and Foot Care Foot care is an important part of your health, especially when you have diabetes. Diabetes may cause you to have problems because of poor blood flow (circulation) to your feet and legs, which can cause your skin to:  Become thinner and drier.  Break more easily.  Heal more slowly.  Peel and crack. You may also have nerve damage (neuropathy) in your legs and feet, causing decreased feeling in them. This means that you may not notice minor injuries to your feet that could lead to more serious problems. Noticing and addressing any potential problems early is the best way to prevent future foot problems. How to care for your feet Foot hygiene  Wash your feet daily with warm water and mild soap. Do not use hot water. Then, pat your feet and the areas between your toes until they are completely dry. Do not soak your feet as this can dry your skin.  Trim your toenails straight across. Do not dig under them or around the cuticle. File the edges of your nails with an emery board or nail file.  Apply a moisturizing lotion or petroleum jelly to the skin on your feet and to dry, brittle toenails. Use lotion that does not contain alcohol and is unscented. Do not apply lotion between your toes. Shoes and socks  Wear clean socks or stockings every day. Make sure they are not too tight. Do not wear knee-high stockings since they may decrease blood flow to your legs.  Wear shoes that fit properly and have enough cushioning. Always look in your shoes before you put them on to be sure there are no objects inside.  To break in new shoes, wear them for just a few hours a day. This prevents injuries on your feet. Wounds, scrapes, corns, and calluses  Check your feet daily for blisters, cuts, bruises, sores, and redness. If you cannot see the bottom of your feet, use a mirror or ask someone for help.  Do not cut corns or calluses or try to remove them with medicine.  If you  find a minor scrape, cut, or break in the skin on your feet, keep it and the skin around it clean and dry. You may clean these areas with mild soap and water. Do not clean the area with peroxide, alcohol, or iodine.  If you have a wound, scrape, corn, or callus on your foot, look at it several times a day to make sure it is healing and not infected. Check for: ? Redness, swelling, or pain. ? Fluid or blood. ? Warmth. ? Pus or a bad smell. General instructions  Do not cross your legs. This may decrease blood flow to your feet.  Do not use heating pads or hot water bottles on your feet. They may burn your skin. If you have lost feeling in your feet or legs, you may not know this is happening until it is too late.  Protect your feet from hot and cold by wearing shoes, such as at the beach or on hot pavement.  Schedule a complete foot exam at least once a year (annually) or more often if you have foot problems. If you have foot problems, report any cuts, sores, or bruises to your health care provider immediately. Contact a health care provider if:  You have a medical condition that increases your risk of infection and you have any cuts, sores, or bruises on your feet.  You have an injury that is not   healing.  You have redness on your legs or feet.  You feel burning or tingling in your legs or feet.  You have pain or cramps in your legs and feet.  Your legs or feet are numb.  Your feet always feel cold.  You have pain around a toenail. Get help right away if:  You have a wound, scrape, corn, or callus on your foot and: ? You have pain, swelling, or redness that gets worse. ? You have fluid or blood coming from the wound, scrape, corn, or callus. ? Your wound, scrape, corn, or callus feels warm to the touch. ? You have pus or a bad smell coming from the wound, scrape, corn, or callus. ? You have a fever. ? You have a red line going up your leg. Summary  Check your feet every day  for cuts, sores, red spots, swelling, and blisters.  Moisturize feet and legs daily.  Wear shoes that fit properly and have enough cushioning.  If you have foot problems, report any cuts, sores, or bruises to your health care provider immediately.  Schedule a complete foot exam at least once a year (annually) or more often if you have foot problems. This information is not intended to replace advice given to you by your health care provider. Make sure you discuss any questions you have with your health care provider. Document Released: 02/19/2000 Document Revised: 04/05/2017 Document Reviewed: 03/25/2016 Elsevier Patient Education  2020 [employer]   Onychomycosis/Fungal Toenails  WHAT IS IT? An infection that lies within the keratin of your nail plate that is caused by a fungus.  WHY ME? Fungal infections affect all ages, sexes, races, and creeds.  There may be many factors that predispose you to a fungal infection such as age, coexisting medical conditions such as diabetes, or an autoimmune disease; stress, medications, fatigue, genetics, etc.  Bottom line: fungus thrives in a warm, moist environment and your shoes offer such a location.  IS IT CONTAGIOUS? Theoretically, yes.  You do not want to share shoes, nail clippers or files with someone who has fungal toenails.  Walking around barefoot in the same room or sleeping in the same bed is unlikely to transfer the organism.  It is important to realize, however, that fungus can spread easily from one nail to the next on the same foot.  HOW DO WE TREAT THIS?  There are several ways to treat this condition.  Treatment may depend on many factors such as age, medications, pregnancy, liver and kidney conditions, etc.  It is best to ask your doctor which options are available to you.  1. No treatment.   Unlike many other medical concerns, you can live with this condition.  However for many people this can be a painful condition and may lead to  ingrown toenails or a bacterial infection.  It is recommended that you keep the nails cut short to help reduce the amount of fungal nail. 2. Topical treatment.  These range from herbal remedies to prescription strength nail lacquers.  About 40-50% effective, topicals require twice daily application for approximately 9 to 12 months or until an entirely new nail has grown out.  The most effective topicals are medical grade medications available through physicians offices. 3. Oral antifungal medications.  With an 80-90% cure rate, the most common oral medication requires 3 to 4 months of therapy and stays in your system for a year as the new nail grows out.  Oral antifungal medications do require   blood work to make sure it is a safe drug for you.  A liver function panel will be performed prior to starting the medication and after the first month of treatment.  It is important to have the blood work performed to avoid any harmful side effects.  In general, this medication safe but blood work is required. 4. Laser Therapy.  This treatment is performed by applying a specialized laser to the affected nail plate.  This therapy is noninvasive, fast, and non-painful.  It is not covered by insurance and is therefore, out of pocket.  The results have been very good with a 80-95% cure rate.  The [facility] is the only practice in the area to offer this therapy. 5. Permanent Nail Avulsion.  Removing the entire nail so that a new nail will not grow back. 

## 2018-11-13 ENCOUNTER — Ambulatory Visit: Payer: PPO | Admitting: Podiatry

## 2018-11-13 NOTE — Progress Notes (Signed)
Subjective: Jeffrey [name] is seen today for follow up painful, elongated, thickened toenails 1-5 b/l feet that he cannot cut. Pain interferes with daily activities. Aggravating factor includes wearing enclosed shoe gear and relieved with periodic debridement.  He is diabetic and on Plavix as well.   Objective:  Neurovascular status unchanged: CFT <3 seconds x 10 digits.  DP/PT pulses palpable b/l.  Digital hair absent b/l.  Skin temperature gradient WNL b/l.  Sensation diminished with 10 gram monofilament b/l.  Dermatological Examination: Skin with normal turgor, texture and tone b/l  Toenails 1-5 b/l discolored, thick, dystrophic with subungual debris and pain with palpation to nailbeds due to thickness of nails.  Musculoskeletal: Muscle strength 5/5 to all LLE muscle groups; RLE weakness secondary to CVA.  No gross bony deformities b/l.  No pain, crepitus or joint limitation noted with ROM.   Assessment: Painful onychomycosis toenails 1-5 b/l  NIDDM with neuropathy  Plan: 1. Toenails 1-5 b/l were debrided in length and girth without iatrogenic bleeding. 2. Patient to continue soft, supportive shoe gear 3. Patient to report any pedal injuries to medical professional immediately. 4. Follow up 3 months.  5. Patient/POA to call should there be a concern in the interim.

## 2018-11-18 ENCOUNTER — Other Ambulatory Visit: Payer: Self-pay | Admitting: Family Medicine

## 2018-11-19 NOTE — Telephone Encounter (Signed)
E-scribed refill.  Pls schedule wellness and lab visits.

## 2018-11-20 NOTE — Telephone Encounter (Signed)
Pt is scheduled for 01/15/19 for Medicare wellness with Dr. Darnell Level. His daughter will call back and schedule labs.

## 2018-11-30 LAB — HM DIABETES EYE EXAM

## 2018-12-24 ENCOUNTER — Telehealth: Payer: Self-pay | Admitting: Family Medicine

## 2018-12-24 NOTE — Telephone Encounter (Signed)
Spoke with pt's daughter, [name] (on dpr), informing her pt's last flu in chart is 12/29/16.  Scheduled pt for flu clinic on 01/01/19 at 2:15.

## 2018-12-24 NOTE — Telephone Encounter (Signed)
Patient's Daughter would like to know when the patient had his last flu vaccine  She is requesting a call back

## 2019-01-01 ENCOUNTER — Ambulatory Visit (INDEPENDENT_AMBULATORY_CARE_PROVIDER_SITE_OTHER): Payer: PPO

## 2019-01-01 DIAGNOSIS — Z23 Encounter for immunization: Secondary | ICD-10-CM

## 2019-01-01 NOTE — Telephone Encounter (Signed)
Due to pt's ride situation he will have labs same day as appt with Dr. Darnell Level.

## 2019-01-15 ENCOUNTER — Encounter: Payer: Self-pay | Admitting: Family Medicine

## 2019-01-15 ENCOUNTER — Other Ambulatory Visit: Payer: Self-pay

## 2019-01-15 ENCOUNTER — Ambulatory Visit (INDEPENDENT_AMBULATORY_CARE_PROVIDER_SITE_OTHER): Payer: PPO | Admitting: Family Medicine

## 2019-01-15 VITALS — BP 140/70 | HR 56 | Temp 97.6°F | Ht 67.5 in | Wt 174.1 lb

## 2019-01-15 DIAGNOSIS — Z7189 Other specified counseling: Secondary | ICD-10-CM

## 2019-01-15 DIAGNOSIS — E1169 Type 2 diabetes mellitus with other specified complication: Secondary | ICD-10-CM

## 2019-01-15 DIAGNOSIS — E1140 Type 2 diabetes mellitus with diabetic neuropathy, unspecified: Secondary | ICD-10-CM

## 2019-01-15 DIAGNOSIS — M1A9XX0 Chronic gout, unspecified, without tophus (tophi): Secondary | ICD-10-CM

## 2019-01-15 DIAGNOSIS — F0631 Mood disorder due to known physiological condition with depressive features: Secondary | ICD-10-CM

## 2019-01-15 DIAGNOSIS — I69351 Hemiplegia and hemiparesis following cerebral infarction affecting right dominant side: Secondary | ICD-10-CM

## 2019-01-15 DIAGNOSIS — E1159 Type 2 diabetes mellitus with other circulatory complications: Secondary | ICD-10-CM

## 2019-01-15 DIAGNOSIS — Z0000 Encounter for general adult medical examination without abnormal findings: Secondary | ICD-10-CM | POA: Diagnosis not present

## 2019-01-15 DIAGNOSIS — E785 Hyperlipidemia, unspecified: Secondary | ICD-10-CM | POA: Diagnosis not present

## 2019-01-15 DIAGNOSIS — I5032 Chronic diastolic (congestive) heart failure: Secondary | ICD-10-CM

## 2019-01-15 DIAGNOSIS — D369 Benign neoplasm, unspecified site: Secondary | ICD-10-CM

## 2019-01-15 LAB — POCT GLYCOSYLATED HEMOGLOBIN (HGB A1C): Hemoglobin A1C: 9.2 % — AB (ref 4.0–5.6)

## 2019-01-15 NOTE — Assessment & Plan Note (Signed)
Stable period. Discussed trial off sertraline. He will try off SSRI and monitor mood.

## 2019-01-15 NOTE — Assessment & Plan Note (Signed)
Seems euvolemic, continues lasix daily.

## 2019-01-15 NOTE — Progress Notes (Signed)
This visit was conducted in person.  BP 140/70 (BP Location: Left Arm, Patient Position: Sitting, Cuff Size: Normal)   Pulse (!) 56   Temp 97.6 F (36.4 C) (Temporal)   Ht 5' 7.5" (1.715 m)   Wt 174 lb 1 oz (79 kg)   SpO2 97%   BMI 26.86 kg/m    CC: CPE Subjective:    Patient ID: Jeffrey [name], male    DOB: 11-Jan-1941, 78 y.o.   MRN: [id number]  HPI: Jeffrey [name] is a 78 y.o. male presenting on 01/15/2019 for Medicare Wellness (Pt accompanied by daughter, Jeffrey [name] (temp, 97.6).)   Did not see health advisor this year.  Has been caring for ill wife at home - dementia.   Here with daughter Jeffrey [name].    Hearing Screening   125Hz  250Hz  500Hz  1000Hz  2000Hz  3000Hz  4000Hz  6000Hz  8000Hz   Right ear:   0 0 40  20    Left ear:   25 25 20   0    Comments: Wears bilateral hearing aids.  Not wearing at today's appt.   Vision Screening Comments: Last eye exam, 11/2018    Office Visit from 01/15/2019 in [place] at [facility] Total Score  0    Failed hearing screen. Sees audiology through [facility] - has hearing aides but he struggles to put them in due to R hemiparesis. Advised f/u with [facility] to discuss new hearing aide options.  Fall Risk  01/15/2019 10/31/2017 07/27/2016 07/23/2015 06/16/2015  Falls in the past year? 0 No No No No  Number falls in past yr: 1 - - - -  Injury with Fall? 0 - - - -    Hemorrhagic L thalamus stroke 05/2014 with residual R hemiparesis, dysmetria of RUE and RLE, diplopia. Right handed. Saw [facility] neuro-ophth for persistent diplopia with R homonymous hemianopsia s/p CVA.   Endocrinology DM followed by Dr Buddy Duty Q3-4 mo - sees regularly next appt 02/05/2019.  Worked with DMV to resume driving [id number].   Preventative: COLONOSCOPY Date: 09/26/2012 tubular adenoma, sm int hem, rpt 5 yrs [facility] Plan). Pt declines repeat at this time given h/o CVA.  Prostate cancer screening - no fmhx prostate cancer. None recent, no problems in the past. 1+ nocturia  fills 2 urinals.Declines screening at this time. Lung cancer screening - not candidate - remote smoker Flu shot - yearly Prevnar 2015, pneumovax 2014 Zoster- 2013.  shingrix - completed through [place] earlier this year.  Advanced directive discussion - HCPOA would be daughter Jeffrey [name]. Has advanced directive at home. Asked to bring [place] a copy.  Seat belt use discussed.  Sunscreen use and skin screen discussed, no changing moles on skin. Ex smoker, quit remotely Alcohol - none  Dentist Q6 mo Eye exam - yearly Bowel - no constipation Bladder - "minor" incontinence  Caffeine: 6-7 cups coffee  Lives with wife, no pets 2 grown children, 4 grandchildren  Special educational needs teacher Edu: [place]  Activity: not much  Diet: plenty of water, fruits and vegetables  Some care through [place]     Relevant past medical, surgical, family and social history reviewed and updated as indicated. Interim medical history since our last visit reviewed. Allergies and medications reviewed and updated. Outpatient Medications Prior to Visit  Medication Sig Dispense Refill  . atorvastatin (LIPITOR) 80 MG tablet Take 80 mg by mouth at bedtime.    . baclofen (LIORESAL) 10 MG tablet Take 0.5 tablets (5 mg total) by mouth 3 (three) times daily. [place]  each 3  . Cholecalciferol (VITAMIN D3) 5000 UNITS TABS Take 5,000 Units by mouth once a week.    . clopidogrel (PLAVIX) 75 MG tablet Take 1 tablet (75 mg total) by mouth daily. 30 tablet 6  . fosinopril ([name]) 40 MG tablet Take 40 mg by mouth daily.    . furosemide (LASIX) 40 MG tablet TAKE 1 TABLET BY MOUTH EVERY DAY 90 tablet 0  . insulin aspart (NOVOLOG FLEXPEN) 100 UNIT/ML FlexPen Inject 5-6 Units into the skin as directed. 7 units with breakfast 7 units with supper    . insulin glargine (LANTUS) 100 UNIT/ML injection Inject 18 Units into the skin at bedtime.  10 mL   . LACTOBACILLUS PO Take 1 capsule by mouth daily. [facility] colon health.  Takes  M/W/F    . loratadine (CLARITIN) 10 MG tablet Take 10 mg by mouth daily as needed.     [name] losartan (COZAAR) 50 MG tablet Take 50 mg by mouth daily.    . metoprolol tartrate (LOPRESSOR) 25 MG tablet Take 1 tablet (25 mg total) by mouth 2 (two) times daily.    . sertraline (ZOLOFT) 25 MG tablet Take 1 tablet (25 mg total) by mouth daily. 30 tablet 11  . senna (SENOKOT) 8.6 MG tablet Take 1 tablet by mouth daily as needed for constipation. Reported on 02/23/2015    . senna (SENOKOT) 8.6 MG tablet Take by mouth.     No facility-administered medications prior to visit.      Per HPI unless specifically indicated in ROS section below Review of Systems  Constitutional: Negative for activity change, appetite change, chills, fatigue, fever and unexpected weight change.       Notes putting on weight across his midsection R>L  HENT: Negative for hearing loss.   Eyes: Negative for visual disturbance.  Respiratory: Negative for cough, chest tightness, shortness of breath and wheezing.   Cardiovascular: Negative for chest pain, palpitations and leg swelling.  Gastrointestinal: Negative for abdominal distention, abdominal pain, blood in stool, constipation, diarrhea, nausea and vomiting.  Genitourinary: Negative for difficulty urinating and hematuria.  Musculoskeletal: Negative for arthralgias, myalgias and neck pain.  Skin: Negative for rash.  Neurological: Positive for weakness (chronic R sided). Negative for dizziness, seizures, syncope and headaches.  Hematological: Negative for adenopathy. Does not bruise/bleed easily.  Psychiatric/Behavioral: Negative for dysphoric mood. The patient is not nervous/anxious.    Objective:    BP 140/70 (BP Location: Left Arm, Patient Position: Sitting, Cuff Size: Normal)   Pulse (!) 56   Temp 97.6 F (36.4 C) (Temporal)   Ht 5' 7.5" (1.715 m)   Wt 174 lb 1 oz (79 kg)   SpO2 97%   BMI 26.86 kg/m   Wt Readings from Last 3 Encounters:  01/15/19 174 lb 1 oz (79  kg)  07/27/18 170 lb (77.1 kg)  10/31/17 173 lb 2 oz (78.5 kg)    Physical Exam Vitals signs and nursing note reviewed.  Constitutional:      General: He is not in acute distress.    Appearance: Normal appearance. He is well-developed. He is not ill-appearing.  HENT:     Head: Normocephalic and atraumatic.     Right Ear: Hearing, tympanic membrane, ear canal and external ear normal.     Left Ear: Hearing, tympanic membrane, ear canal and external ear normal.     Nose: Nose normal.     Mouth/Throat:     Mouth: Mucous membranes are moist.     Pharynx:  Oropharynx is clear. Uvula midline. No posterior oropharyngeal erythema.  Eyes:     General: No scleral icterus.    Conjunctiva/sclera: Conjunctivae normal.     Pupils: Pupils are equal, round, and reactive to light.  Neck:     Musculoskeletal: Normal range of motion and neck supple.     Vascular: No carotid bruit.  Cardiovascular:     Rate and Rhythm: Normal rate and regular rhythm.     Pulses: Normal pulses.          Radial pulses are 2+ on the right side and 2+ on the left side.     Heart sounds: Normal heart sounds. No murmur.  Pulmonary:     Effort: Pulmonary effort is normal. No respiratory distress.     Breath sounds: Normal breath sounds. No wheezing, rhonchi or rales.  Abdominal:     General: Abdomen is flat. Bowel sounds are normal. There is no distension.     Palpations: Abdomen is soft. There is no mass.     Tenderness: There is no abdominal tenderness. There is no guarding or rebound.     Hernia: No hernia is present.  Musculoskeletal: Normal range of motion.     Right lower leg: No edema.     Left lower leg: No edema.  Lymphadenopathy:     Cervical: No cervical adenopathy.  Skin:    General: Skin is warm and dry.     Findings: No rash.  Neurological:     General: No focal deficit present.     Mental Status: He is alert and oriented to person, place, and time.     Comments:  CN grossly intact, station and gait  intact Recall 2/3, 3/3 with cue Calculation 3/5 DLOUW  Psychiatric:        Mood and Affect: Mood normal.        Behavior: Behavior normal.        Thought Content: Thought content normal.        Judgment: Judgment normal.       Results for orders placed or performed in visit on 01/15/19  POCT glycosylated hemoglobin (Hb A1C)  Result Value Ref Range   Hemoglobin A1C 9.2 (A) 4.0 - 5.6 %   HbA1c POC (<> result, manual entry)     HbA1c, POC (prediabetic range)     HbA1c, POC (controlled diabetic range)     Depression screen [facility] 2/9 01/15/2019 10/31/2017 07/27/2016 07/23/2015 12/17/2014  Decreased Interest 0 0 0 0 0  Down, Depressed, Hopeless 0 0 0 0 0  PHQ - 2 Score 0 0 0 0 0  Altered sleeping 0 0 - - -  Tired, decreased energy 1 0 - - -  Change in appetite 0 0 - - -  Feeling bad or failure about yourself  0 0 - - -  Trouble concentrating 1 0 - - -  Moving slowly or fidgety/restless 0 0 - - -  Suicidal thoughts 0 0 - - -  PHQ-9 Score 2 0 - - -  Difficult doing work/chores - Not difficult at all - - -  Some recent data might be hidden   GAD 7 : Generalized Anxiety Score 01/15/2019  Nervous, Anxious, on Edge 0  Control/stop worrying 0  Worry too much - different things 0  Trouble relaxing 0  Restless 0  Easily annoyed or irritable 0  Afraid - awful might happen 0  Total GAD 7 Score 0   Assessment & Plan:   Problem  List Items Addressed This Visit    Type 2 diabetes, controlled, with neuropathy ([place])    A1c remains too high. Encouraged renewed efforts at [name] sugar [name] carb diabetic diet.       Relevant Orders   POCT glycosylated hemoglobin (Hb A1C) (Completed)   Medicare annual wellness visit, subsequent - Primary    I have personally reviewed the Medicare Annual Wellness questionnaire and have noted 1. The patient's medical and social history 2. Their use of alcohol, tobacco or illicit drugs 3. Their current medications and supplements 4. The patient's functional  ability including ADL's, fall risks, home safety risks and hearing or visual impairment. Cognitive function has been assessed and addressed as indicated.  5. Diet and physical activity 6. Evidence for depression or mood disorders The patients weight, height, BMI have been recorded in the chart. I have made referrals, counseling and provided education to the patient based on review of the above and I have provided the pt with a written personalized care plan for preventive services. Provider list updated.. See scanned questionairre as needed for further documentation. Reviewed preventative protocols and updated unless pt declined.       Hypertension associated with diabetes (HCC)    Chronic, stable. Continue current regimen.      Hyperlipidemia associated with type 2 diabetes mellitus (HCC)    Chronic, stable. Continue atorvastatin.  The 10-year ASCVD risk score Mikey Bussing DC Brooke Bonito., et al., 2013) is: 53.5%   Values used to calculate the score:     Age: 49 years     Sex: Male     Is Non-Hispanic African American: No     Diabetic: Yes     Tobacco smoker: No     Systolic Blood Pressure: [id number] mmHg     Is BP treated: Yes     HDL Cholesterol: 32.8 mg/dL     Total Cholesterol: 135 mg/dL       Hemiparesis affecting right side as late effect of cerebrovascular accident ([place])    Stable period on baclofen 1/2 tab TID. Uses leg brace and transport chair.       Health maintenance examination    Preventative protocols reviewed and updated unless pt declined. Discussed healthy diet and lifestyle.       Gout    No recent flare. Update uric acid.       Relevant Orders   Uric acid   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    Stable period. Discussed trial off sertraline. He will try off SSRI and monitor mood.       Chronic diastolic heart failure, NYHA class 1 (HCC)    Seems euvolemic, continues lasix daily.       Advanced care planning/counseling discussion    Advanced directive discussion - HCPOA  would be daughter Jeffrey [name]. Has advanced directive at home. Asked to bring [place] a copy.       Adenomatous polyps    Declines colon cancer screening at this time.           No orders of the defined types were placed in this encounter.  Orders Placed This Encounter  Procedures  . Lipid panel  . Comprehensive metabolic panel  . TSH  . Uric acid  . POCT glycosylated hemoglobin (Hb A1C)    Patient instructions: Bring [place] a copy of your advanced directives.  Sugar was too high - work on [name] sugar [name] carb diabetic diet.  Labs today.  Ok to trial off sertraline. If worsening mood, ok  to restart.  Return as needed or in 6 months for follow up visit.   Follow up plan: Return in about 6 months (around 07/15/2019) for follow up visit.  [name], MD

## 2019-01-15 NOTE — Patient Instructions (Addendum)
Bring [place] a copy of your advanced directives.  Sugar was too high - work on low sugar low carb diabetic diet.  Labs today.  Ok to trial off sertraline. If worsening mood, ok to restart.  Return as needed or in 6 months for follow up visit.   Health Maintenance After Age 76 After age 1, you are at a higher risk for certain long-term diseases and infections as well as injuries from falls. Falls are a major cause of broken bones and head injuries in people who are older than age 97. Getting regular preventive care can help to keep you healthy and well. Preventive care includes getting regular testing and making lifestyle changes as recommended by your health care provider. Talk with your health care provider about:  Which screenings and tests you should have. A screening is a test that checks for a disease when you have no symptoms.  A diet and exercise plan that is right for you. What should I know about screenings and tests to prevent falls? Screening and testing are the best ways to find a health problem early. Early diagnosis and treatment give you the best chance of managing medical conditions that are common after age 33. Certain conditions and lifestyle choices may make you more likely to have a fall. Your health care provider may recommend:  Regular vision checks. Poor vision and conditions such as cataracts can make you more likely to have a fall. If you wear glasses, make sure to get your prescription updated if your vision changes.  Medicine review. Work with your health care provider to regularly review all of the medicines you are taking, including over-the-counter medicines. Ask your health care provider about any side effects that may make you more likely to have a fall. Tell your health care provider if any medicines that you take make you feel dizzy or sleepy.  Osteoporosis screening. Osteoporosis is a condition that causes the bones to get weaker. This can make the bones weak and  cause them to break more easily.  Blood pressure screening. Blood pressure changes and medicines to control blood pressure can make you feel dizzy.  Strength and balance checks. Your health care provider may recommend certain tests to check your strength and balance while standing, walking, or changing positions.  Foot health exam. Foot pain and numbness, as well as not wearing proper footwear, can make you more likely to have a fall.  Depression screening. You may be more likely to have a fall if you have a fear of falling, feel emotionally low, or feel unable to do activities that you used to do.  Alcohol use screening. Using too much alcohol can affect your balance and may make you more likely to have a fall. What actions can I take to lower my risk of falls? General instructions  Talk with your health care provider about your risks for falling. Tell your health care provider if: ? You fall. Be sure to tell your health care provider about all falls, even ones that seem minor. ? You feel dizzy, sleepy, or off-balance.  Take over-the-counter and prescription medicines only as told by your health care provider. These include any supplements.  Eat a healthy diet and maintain a healthy weight. A healthy diet includes low-fat dairy products, low-fat (lean) meats, and fiber from whole grains, beans, and lots of fruits and vegetables. Home safety  Remove any tripping hazards, such as rugs, cords, and clutter.  Install safety equipment such as grab bars  in bathrooms and safety rails on stairs.  Keep rooms and walkways well-lit. Activity   Follow a regular exercise program to stay fit. This will help you maintain your balance. Ask your health care provider what types of exercise are appropriate for you.  If you need a cane or walker, use it as recommended by your health care provider.  Wear supportive shoes that have nonskid soles. Lifestyle  Do not drink alcohol if your health care  provider tells you not to drink.  If you drink alcohol, limit how much you have: ? 0-1 drink a day for women. ? 0-2 drinks a day for men.  Be aware of how much alcohol is in your drink. In the [place] one drink equals one typical bottle of beer (12 oz), one-half glass of wine (5 oz), or one shot of hard liquor (1 oz).  Do not use any products that contain nicotine or tobacco, such as cigarettes and e-cigarettes. If you need help quitting, ask your health care provider. Summary  Having a healthy lifestyle and getting preventive care can help to protect your health and wellness after age 60.  Screening and testing are the best way to find a health problem early and help you avoid having a fall. Early diagnosis and treatment give you the best chance for managing medical conditions that are more common for people who are older than age 53.  Falls are a major cause of broken bones and head injuries in people who are older than age 76. Take precautions to prevent a fall at home.  Work with your health care provider to learn what changes you can make to improve your health and wellness and to prevent falls. This information is not intended to replace advice given to you by your health care provider. Make sure you discuss any questions you have with your health care provider. Document Released: 01/04/2017 Document Revised: 06/14/2018 Document Reviewed: 01/04/2017 Elsevier Patient Education  2020 [employer].

## 2019-01-15 NOTE — Assessment & Plan Note (Addendum)
Advanced directive discussion - HCPOA would be daughter Christy. Has advanced directive at home. Asked to bring us a copy.  

## 2019-01-15 NOTE — Assessment & Plan Note (Addendum)
No recent flare. Update uric acid.

## 2019-01-15 NOTE — Assessment & Plan Note (Signed)
Chronic, stable. Continue current regimen. 

## 2019-01-15 NOTE — Assessment & Plan Note (Signed)
Chronic, stable. Continue atorvastatin.  The 10-year ASCVD risk score Jeffrey [name] Jeffrey [name] Jeffrey [name]., et al., 2013) is: 53.5%   Values used to calculate the score:     Age: 78 years     Sex: Male     Is Non-Hispanic African American: No     Diabetic: Yes     Tobacco smoker: No     Systolic Blood Pressure: [id number] mmHg     Is BP treated: Yes     HDL Cholesterol: 32.8 mg/dL     Total Cholesterol: 135 mg/dL

## 2019-01-15 NOTE — Assessment & Plan Note (Signed)
Preventative protocols reviewed and updated unless pt declined. Discussed healthy diet and lifestyle.  

## 2019-01-15 NOTE — Assessment & Plan Note (Addendum)
A1c remains too high. Encouraged renewed efforts at low sugar low carb diabetic diet.

## 2019-01-15 NOTE — Assessment & Plan Note (Signed)
Declines colon cancer screening at this time 

## 2019-01-15 NOTE — Assessment & Plan Note (Signed)

## 2019-01-15 NOTE — Assessment & Plan Note (Signed)
Stable period on baclofen 1/2 tab TID. Uses leg brace and transport chair.

## 2019-01-16 LAB — COMPREHENSIVE METABOLIC PANEL WITH GFR
ALT: 26 U/L (ref 0–53)
AST: 16 U/L (ref 0–37)
Albumin: 4.2 g/dL (ref 3.5–5.2)
Alkaline Phosphatase: 111 U/L (ref 39–117)
BUN: 15 mg/dL (ref 6–23)
CO2: 33 meq/L — ABNORMAL HIGH (ref 19–32)
Calcium: 9.6 mg/dL (ref 8.4–10.5)
Chloride: 102 meq/L (ref 96–112)
Creatinine, Ser: 1 mg/dL (ref 0.40–1.50)
GFR: 72.65 mL/min
Glucose, Bld: 148 mg/dL — ABNORMAL HIGH (ref 70–99)
Potassium: 3.9 meq/L (ref 3.5–5.1)
Sodium: 142 meq/L (ref 135–145)
Total Bilirubin: 0.9 mg/dL (ref 0.2–1.2)
Total Protein: 6.7 g/dL (ref 6.0–8.3)

## 2019-01-16 LAB — LIPID PANEL
Cholesterol: 152 mg/dL (ref 0–200)
HDL: 34.5 mg/dL — ABNORMAL LOW
LDL Cholesterol: 80 mg/dL (ref 0–99)
NonHDL: 117.28
Total CHOL/HDL Ratio: 4
Triglycerides: 184 mg/dL — ABNORMAL HIGH (ref 0.0–149.0)
VLDL: 36.8 mg/dL (ref 0.0–40.0)

## 2019-01-16 LAB — URIC ACID: Uric Acid, Serum: 6.3 mg/dL (ref 4.0–7.8)

## 2019-01-16 LAB — TSH: TSH: 0.87 u[IU]/mL (ref 0.35–4.50)

## 2019-01-30 ENCOUNTER — Other Ambulatory Visit: Payer: Self-pay

## 2019-02-05 DIAGNOSIS — E11319 Type 2 diabetes mellitus with unspecified diabetic retinopathy without macular edema: Secondary | ICD-10-CM | POA: Diagnosis not present

## 2019-02-05 DIAGNOSIS — I10 Essential (primary) hypertension: Secondary | ICD-10-CM | POA: Diagnosis not present

## 2019-02-05 DIAGNOSIS — Z23 Encounter for immunization: Secondary | ICD-10-CM | POA: Diagnosis not present

## 2019-02-05 DIAGNOSIS — E1165 Type 2 diabetes mellitus with hyperglycemia: Secondary | ICD-10-CM | POA: Diagnosis not present

## 2019-02-05 DIAGNOSIS — Z794 Long term (current) use of insulin: Secondary | ICD-10-CM | POA: Diagnosis not present

## 2019-02-05 DIAGNOSIS — E785 Hyperlipidemia, unspecified: Secondary | ICD-10-CM | POA: Diagnosis not present

## 2019-02-05 DIAGNOSIS — Z8673 Personal history of transient ischemic attack (TIA), and cerebral infarction without residual deficits: Secondary | ICD-10-CM | POA: Diagnosis not present

## 2019-02-05 DIAGNOSIS — E1142 Type 2 diabetes mellitus with diabetic polyneuropathy: Secondary | ICD-10-CM | POA: Diagnosis not present

## 2019-02-12 ENCOUNTER — Other Ambulatory Visit: Payer: Self-pay

## 2019-02-12 ENCOUNTER — Encounter: Payer: Self-pay | Admitting: Podiatry

## 2019-02-12 ENCOUNTER — Ambulatory Visit (INDEPENDENT_AMBULATORY_CARE_PROVIDER_SITE_OTHER): Payer: PPO | Admitting: Podiatry

## 2019-02-12 DIAGNOSIS — M79674 Pain in right toe(s): Secondary | ICD-10-CM

## 2019-02-12 DIAGNOSIS — B351 Tinea unguium: Secondary | ICD-10-CM | POA: Diagnosis not present

## 2019-02-12 DIAGNOSIS — M79675 Pain in left toe(s): Secondary | ICD-10-CM | POA: Diagnosis not present

## 2019-02-17 NOTE — Progress Notes (Signed)
Subjective: KHRIZ BAILIE is seen today for diabetic foot care follow up of painful, elongated, thickened toenails bilateral feet that she cannot cut. Pain interferes with daily activities. Aggravating factor includes wearing enclosed shoe gear and relieved with periodic debridement.  Medications reviewed in chart.  Allergies  Allergen Reactions  . Niacin Other (See Comments)    REACTION: intolerant,not allergic="flushing,hot flashes,turning red" REACTION: intolerant,not allergic="flushing,hot flashes,turning red"    Objective:  Vascular Examination: Capillary refill time to digits <3 seconds b/l.  Dorsalis pedis present b/l.  Posterior tibial pulses present b/l.  Digital hair absent b/l.  Skin temperature gradient WNL b/l.   Dermatological Examination: Skin with normal turgor, texture and tone b/l.  Toenails 1-5 b/l discolored, thick, dystrophic with subungual debris and pain with palpation to nailbeds due to thickness of nails.  Incurvated nailplate left great toe lateral border with tenderness to palpation. No erythema, no edema, no drainage noted. Incurvated nailplate right 2nd toe medial border with tenderness to palpation. No erythema, no edema, no drainage noted.  Resolving blood blister left hallux. No signs of infection.  Musculoskeletal: Muscle strength 5/5 to all LLE muscle groups. RLE weakness secondary to CVA.  No gross bony deformities b/l.  No pain, crepitus or joint limitation noted with ROM.   Neurological Examination: Protective sensation diminished with 10 gram monofilament bilaterally.  Assessment: Painful onychomycosis toenails 1-5 b/l  Ingrown toenail left hallux, right 2nd digit, noninfected NIDDM with neuropathy  Plan: 1. Toenails 1-5 b/l were debrided in length and girth without iatrogenic bleeding.Offending nail border debrided and curretaged left hallux and right 2nd toe. Borders cleansed with alcohol and triple antibiotic applied. No  further treatment required by patient/caregiver. 2. Patient to continue soft, supportive shoe gear daily. 3. Patient to report any pedal injuries to medical professional immediately. 4. Follow up 3 months.  5. Patient/POA to call should there be a concern in the interim.

## 2019-02-19 ENCOUNTER — Other Ambulatory Visit: Payer: Self-pay | Admitting: Family Medicine

## 2019-04-02 ENCOUNTER — Telehealth: Payer: Self-pay | Admitting: Family Medicine

## 2019-04-02 DIAGNOSIS — Z66 Do not resuscitate: Secondary | ICD-10-CM

## 2019-04-02 NOTE — Telephone Encounter (Signed)
Pt's daughter called to request a copy of DNR to be mailed to home address. States they don't have a copy of it.

## 2019-04-05 NOTE — Telephone Encounter (Signed)
Returned Christie's call.  Lvm asking her to call back.

## 2019-04-05 NOTE — Telephone Encounter (Signed)
[name] returned your call Best number [phone]

## 2019-04-05 NOTE — Telephone Encounter (Signed)
Lvm for pt's daughter, [name] (on dpr) to call back.

## 2019-04-09 NOTE — Telephone Encounter (Signed)
I do not see a DNR in pt's chart.

## 2019-04-09 NOTE — Telephone Encounter (Signed)
Reviewed chart - we have not specifically completed a DNR order for patient. We do have advanced directive on file - do they need a copy of this? (dated 2016)

## 2019-04-10 NOTE — Telephone Encounter (Signed)
Noted.  Dr. Darnell Level, I can mail DNR with Advanced Directive if you want.

## 2019-04-10 NOTE — Telephone Encounter (Signed)
Lvm for pt's Jeffrey [name] (daughter) to call back.  Need to relay Dr. Synthia Innocent message.

## 2019-04-10 NOTE — Telephone Encounter (Signed)
Patient returned Jeffrey [name]'s call.  [name] said she would like a copy of the Advanced Directive, but she, also, need a DNR.

## 2019-04-11 ENCOUNTER — Encounter: Payer: Self-pay | Admitting: Family Medicine

## 2019-04-11 DIAGNOSIS — Z66 Do not resuscitate: Secondary | ICD-10-CM | POA: Insufficient documentation

## 2019-04-11 NOTE — Telephone Encounter (Signed)
Noted.  Mailed info to pt.

## 2019-04-11 NOTE — Telephone Encounter (Addendum)
Dad's # [phone].  Spoke with daughter who is isolated due to positive Covid status.  Spoke with patient, reviewed reviewed DNR order. He would not want prolonged resucitation attempts.  plz mail DNR form and copy of advanced directives to patient.

## 2019-05-06 DIAGNOSIS — E1165 Type 2 diabetes mellitus with hyperglycemia: Secondary | ICD-10-CM | POA: Diagnosis not present

## 2019-05-06 DIAGNOSIS — E11319 Type 2 diabetes mellitus with unspecified diabetic retinopathy without macular edema: Secondary | ICD-10-CM | POA: Diagnosis not present

## 2019-05-06 DIAGNOSIS — Z23 Encounter for immunization: Secondary | ICD-10-CM | POA: Diagnosis not present

## 2019-05-06 DIAGNOSIS — E1142 Type 2 diabetes mellitus with diabetic polyneuropathy: Secondary | ICD-10-CM | POA: Diagnosis not present

## 2019-05-06 DIAGNOSIS — Z8673 Personal history of transient ischemic attack (TIA), and cerebral infarction without residual deficits: Secondary | ICD-10-CM | POA: Diagnosis not present

## 2019-05-06 DIAGNOSIS — E785 Hyperlipidemia, unspecified: Secondary | ICD-10-CM | POA: Diagnosis not present

## 2019-05-06 DIAGNOSIS — I10 Essential (primary) hypertension: Secondary | ICD-10-CM | POA: Diagnosis not present

## 2019-05-06 DIAGNOSIS — Z794 Long term (current) use of insulin: Secondary | ICD-10-CM | POA: Diagnosis not present

## 2019-05-10 DIAGNOSIS — L720 Epidermal cyst: Secondary | ICD-10-CM | POA: Diagnosis not present

## 2019-05-10 DIAGNOSIS — D225 Melanocytic nevi of trunk: Secondary | ICD-10-CM | POA: Diagnosis not present

## 2019-05-14 ENCOUNTER — Other Ambulatory Visit: Payer: Self-pay

## 2019-05-14 ENCOUNTER — Ambulatory Visit: Payer: PPO | Admitting: Podiatry

## 2019-05-14 ENCOUNTER — Encounter: Payer: Self-pay | Admitting: Podiatry

## 2019-05-14 DIAGNOSIS — E1142 Type 2 diabetes mellitus with diabetic polyneuropathy: Secondary | ICD-10-CM

## 2019-05-14 DIAGNOSIS — M79675 Pain in left toe(s): Secondary | ICD-10-CM

## 2019-05-14 DIAGNOSIS — B351 Tinea unguium: Secondary | ICD-10-CM

## 2019-05-14 DIAGNOSIS — M79674 Pain in right toe(s): Secondary | ICD-10-CM | POA: Diagnosis not present

## 2019-05-14 NOTE — Patient Instructions (Signed)
Fungal Nail Infection A fungal nail infection is a common infection of the toenails or fingernails. This condition affects toenails more often than fingernails. It often affects the great, or big, toes. More than one nail may be infected. The condition can be passed from person to person (is contagious). What are the causes? This condition is caused by a fungus. Several types of fungi can cause the infection. These fungi are common in moist and warm areas. If your hands or feet come into contact with the fungus, it may get into a crack in your fingernail or toenail and cause the infection. What increases the risk? The following factors may make you more likely to develop this condition:  Being male.  Being of older age.  Living with someone who has the fungus.  Walking barefoot in areas where the fungus thrives, such as showers or locker rooms.  Wearing shoes and socks that cause your feet to sweat.  Having a nail injury or a recent nail surgery.  Having certain medical conditions, such as: ? Athlete's foot. ? Diabetes. ? Psoriasis. ? Poor circulation. ? A weak body defense system (immune system). What are the signs or symptoms? Symptoms of this condition include:  A pale spot on the nail.  Thickening of the nail.  A nail that becomes yellow or brown.  A brittle or ragged nail edge.  A crumbling nail.  A nail that has lifted away from the nail bed. How is this diagnosed? This condition is diagnosed with a physical exam. Your health care provider may take a scraping or clipping from your nail to test for the fungus. How is this treated? Treatment is not needed for mild infections. If you have significant nail changes, treatment may include:  Antifungal medicines taken by mouth (orally). You may need to take the medicine for several weeks or several months, and you may not see the results for a long time. These medicines can cause side effects. Ask your health care provider  what problems to watch for.  Antifungal nail polish or nail cream. These may be used along with oral antifungal medicines.  Laser treatment of the nail.  Surgery to remove the nail. This may be needed for the most severe infections. It can take a long time, usually up to a year, for the infection to go away. The infection may also come back. Follow these instructions at home: Medicines  Take or apply over-the-counter and prescription medicines only as told by your health care provider.  Ask your health care provider about using over-the-counter mentholated ointment on your nails. Nail care  Trim your nails often.  Wash and dry your hands and feet every day.  Keep your feet dry: ? Wear absorbent socks, and change your socks frequently. ? Wear shoes that allow air to circulate, such as sandals or canvas tennis shoes. Throw out old shoes.  Do not use artificial nails.  If you go to a nail salon, make sure you choose one that uses clean instruments.  Use antifungal foot powder on your feet and in your shoes. General instructions  Do not share personal items, such as towels or nail clippers.  Do not walk barefoot in shower rooms or locker rooms.  Wear rubber gloves if you are working with your hands in wet areas.  Keep all follow-up visits as told by your health care provider. This is important. Contact a health care provider if: Your infection is not getting better or it is getting worse   after several months. Summary  A fungal nail infection is a common infection of the toenails or fingernails.  Treatment is not needed for mild infections. If you have significant nail changes, treatment may include taking medicine orally and applying medicine to your nails.  It can take a long time, usually up to a year, for the infection to go away. The infection may also come back.  Take or apply over-the-counter and prescription medicines only as told by your health care  provider.  Follow instructions for taking care of your nails to help prevent infection from coming back or spreading. This information is not intended to replace advice given to you by your health care provider. Make sure you discuss any questions you have with your health care provider. Document Revised: 06/14/2018 Document Reviewed: 07/28/2017 Elsevier Patient Education  [place]. Diabetes Mellitus and [place] care is an important part of your health, especially when you have diabetes. Diabetes may cause you to have problems because of poor blood flow (circulation) to your feet and legs, which can cause your skin to:  Become thinner and drier.  Break more easily.  Heal more slowly.  Peel and crack. You may also have nerve damage (neuropathy) in your legs and feet, causing decreased feeling in them. This means that you may not notice minor injuries to your feet that could lead to more serious problems. Noticing and addressing any potential problems early is the best way to prevent future foot problems. How to care for your feet Foot hygiene  Wash your feet daily with warm water and mild soap. Do not use hot water. Then, pat your feet and the areas between your toes until they are completely dry. Do not soak your feet as this can dry your skin.  Trim your toenails straight across. Do not dig under them or around the cuticle. File the edges of your nails with an emery board or nail file.  Apply a moisturizing lotion or petroleum jelly to the skin on your feet and to dry, brittle toenails. Use lotion that does not contain alcohol and is unscented. Do not apply lotion between your toes. Shoes and socks  Wear clean socks or stockings every day. Make sure they are not too tight. Do not wear knee-high stockings since they may decrease blood flow to your legs.  Wear shoes that fit properly and have enough cushioning. Always look in your shoes before you put them on to be sure  there are no objects inside.  To break in new shoes, wear them for just a few hours a day. This prevents injuries on your feet. Wounds, scrapes, corns, and calluses  Check your feet daily for blisters, cuts, bruises, sores, and redness. If you cannot see the bottom of your feet, use a mirror or ask someone for help.  Do not cut corns or calluses or try to remove them with medicine.  If you find a minor scrape, cut, or break in the skin on your feet, keep it and the skin around it clean and dry. You may clean these areas with mild soap and water. Do not clean the area with peroxide, alcohol, or iodine.  If you have a wound, scrape, corn, or callus on your foot, look at it several times a day to make sure it is healing and not infected. Check for: ? Redness, swelling, or pain. ? Fluid or blood. ? Warmth. ? Pus or a bad smell. General instructions  Do not  cross your legs. This may decrease blood flow to your feet.  Do not use heating pads or hot water bottles on your feet. They may burn your skin. If you have lost feeling in your feet or legs, you may not know this is happening until it is too late.  Protect your feet from hot and cold by wearing shoes, such as at the beach or on hot pavement.  Schedule a complete foot exam at least once a year (annually) or more often if you have foot problems. If you have foot problems, report any cuts, sores, or bruises to your health care provider immediately. Contact a health care provider if:  You have a medical condition that increases your risk of infection and you have any cuts, sores, or bruises on your feet.  You have an injury that is not healing.  You have redness on your legs or feet.  You feel burning or tingling in your legs or feet.  You have pain or cramps in your legs and feet.  Your legs or feet are numb.  Your feet always feel cold.  You have pain around a toenail. Get help right away if:  You have a wound, scrape, corn,  or callus on your foot and: ? You have pain, swelling, or redness that gets worse. ? You have fluid or blood coming from the wound, scrape, corn, or callus. ? Your wound, scrape, corn, or callus feels warm to the touch. ? You have pus or a bad smell coming from the wound, scrape, corn, or callus. ? You have a fever. ? You have a red line going up your leg. Summary  Check your feet every day for cuts, sores, red spots, swelling, and blisters.  Moisturize feet and legs daily.  Wear shoes that fit properly and have enough cushioning.  If you have foot problems, report any cuts, sores, or bruises to your health care provider immediately.  Schedule a complete foot exam at least once a year (annually) or more often if you have foot problems. This information is not intended to replace advice given to you by your health care provider. Make sure you discuss any questions you have with your health care provider. Document Revised: 11/14/2018 Document Reviewed: 03/25/2016 Elsevier Patient Education  [place].

## 2019-05-16 ENCOUNTER — Encounter: Payer: Self-pay | Admitting: Family Medicine

## 2019-05-20 NOTE — Progress Notes (Signed)
Subjective: Jeffrey [name] presents today for follow up of preventative diabetic foot care and painful mycotic nails b/l that are difficult to trim. Pain interferes with ambulation. Aggravating factors include wearing enclosed shoe gear. Pain is relieved with periodic professional debridement.   He has h/o diabetic neuropathy. He presents with his wife on today's visit. They voice no new pedal concerns today.   He is also on blood thinner, Plavix.  Allergies  Allergen Reactions  . Niacin Other (See Comments)    REACTION: intolerant,not allergic="flushing,hot flashes,turning red" REACTION: intolerant,not allergic="flushing,hot flashes,turning red"     Objective: There were no vitals filed for this visit.  Pt 79 y.o. year old male  in NAD. AAO x 3.   Vascular Examination:  Capillary fill time to digits <3 seconds b/l. Palpable DP pulses b/l. Palpable PT pulses b/l. Pedal hair absent b/l Skin temperature gradient within normal limits b/l.  Dermatological Examination: Pedal skin with normal turgor, texture and tone bilaterally. No open wounds bilaterally. No interdigital macerations bilaterally. Toenails 1-5 b/l elongated, dystrophic, thickened, crumbly with subungual debris and tenderness to dorsal palpation.  Musculoskeletal: Normal muscle strength 5/5 to all lower extremity muscle groups bilaterally, no gross bony deformities bilaterally and no pain crepitus or joint limitation noted with ROM b/l  Neurological: Protective sensation diminished with 10g monofilament b/l.  Assessment: 1. Pain due to onychomycosis of toenails of both feet   2. DM type 2 with diabetic peripheral neuropathy ([place])    Plan: -Continue diabetic foot care principles. Literature dispensed on today.  -Toenails 1-5 b/l were debrided in length and girth with sterile nail nippers and dremel without iatrogenic bleeding.  -Patient to continue soft, supportive shoe gear daily. -Patient to report any pedal  injuries to medical professional immediately. -Patient/POA to call should there be question/concern in the interim.  Return in about 3 months (around 08/14/2019).

## 2019-07-02 ENCOUNTER — Telehealth: Payer: Self-pay | Admitting: Family Medicine

## 2019-07-02 NOTE — Progress Notes (Signed)
  Chronic Care Management   Outreach Note  07/02/2019 Name: Jeffrey [name] MRN: [id number] DOB: [birth year]  Referred by: [name], MD Reason for referral : No chief complaint on file.   An unsuccessful telephone outreach was attempted today. The patient was referred to the pharmacist for assistance with care management and care coordination.    This note is not being shared with the patient for the following reason: To respect privacy (The patient or proxy has requested that the information not be shared).  Follow Up Plan:   [name] [employer] Scheduler

## 2019-07-11 ENCOUNTER — Telehealth: Payer: Self-pay | Admitting: Family Medicine

## 2019-07-11 NOTE — Progress Notes (Signed)
  Chronic Care Management   Outreach Note  07/11/2019 Name: ODUS PICCOLI MRN: [id number] DOB: 29-Jan-1941  Referred by: [name], MD Reason for referral : No chief complaint on file.   A second unsuccessful telephone outreach was attempted today. The patient was referred to pharmacist for assistance with care management and care coordination.   This note is not being shared with the patient for the following reason: To respect privacy (The patient or proxy has requested that the information not be shared).  Follow Up Plan:   [name] [employer] Scheduler

## 2019-07-15 ENCOUNTER — Telehealth: Payer: Self-pay | Admitting: Cardiology

## 2019-07-15 ENCOUNTER — Ambulatory Visit: Payer: PPO | Admitting: Family Medicine

## 2019-07-15 NOTE — Telephone Encounter (Signed)
Spoke with Jeffrey [name] (on DPR) - advised last fasting lab for in November 2020 and is generally done once a year unless there has been a medication change.  Pt does not need to be fasting at this appt.

## 2019-07-15 NOTE — Telephone Encounter (Signed)
Jeffrey [name] is calling wanting to know if Dr. Marlou Porch is wanting Ravon to have fasting labs performed at his upcoming appointment scheduled for 08/07/19. I did not see an active request for any blood work to be performed. Please advise.

## 2019-07-15 NOTE — Telephone Encounter (Signed)
Patient's daughter Jeffrey [name] is returning a call she received on 5/6  She stated she had just came home from surgery and was out of it when she received the call

## 2019-07-18 ENCOUNTER — Telehealth: Payer: Self-pay | Admitting: Family Medicine

## 2019-07-18 NOTE — Progress Notes (Signed)
DAUGHTER WILL ASSIST WITH CCM CALL.  CHRISTIE WILLIAMSON @ [phone].  This note is not being shared with the patient for the following reason: To respect privacy (The patient or proxy has requested that the information not be shared).   Earney [place] [employer] Scheduler

## 2019-07-22 ENCOUNTER — Ambulatory Visit: Payer: PPO | Admitting: Family Medicine

## 2019-07-30 ENCOUNTER — Telehealth: Payer: Self-pay | Admitting: Family Medicine

## 2019-07-30 NOTE — Telephone Encounter (Signed)
I called patient's daughter, [name], to let her know patient's handicapped placard form is ready for pick up. Unable to leave message because her voice mail is full.

## 2019-07-31 ENCOUNTER — Ambulatory Visit: Payer: PPO | Admitting: Family Medicine

## 2019-08-07 ENCOUNTER — Ambulatory Visit: Payer: PPO | Admitting: Cardiology

## 2019-08-14 ENCOUNTER — Encounter: Payer: Self-pay | Admitting: Podiatry

## 2019-08-14 ENCOUNTER — Ambulatory Visit: Payer: PPO | Admitting: Podiatry

## 2019-08-14 ENCOUNTER — Other Ambulatory Visit: Payer: Self-pay

## 2019-08-14 DIAGNOSIS — E1142 Type 2 diabetes mellitus with diabetic polyneuropathy: Secondary | ICD-10-CM

## 2019-08-14 DIAGNOSIS — M79675 Pain in left toe(s): Secondary | ICD-10-CM | POA: Diagnosis not present

## 2019-08-14 DIAGNOSIS — B351 Tinea unguium: Secondary | ICD-10-CM

## 2019-08-14 DIAGNOSIS — M79674 Pain in right toe(s): Secondary | ICD-10-CM

## 2019-08-14 NOTE — Patient Instructions (Signed)
Diabetes Mellitus and Foot Care Foot care is an important part of your health, especially when you have diabetes. Diabetes may cause you to have problems because of poor blood flow (circulation) to your feet and legs, which can cause your skin to:  Become thinner and drier.  Break more easily.  Heal more slowly.  Peel and crack. You may also have nerve damage (neuropathy) in your legs and feet, causing decreased feeling in them. This means that you may not notice minor injuries to your feet that could lead to more serious problems. Noticing and addressing any potential problems early is the best way to prevent future foot problems. How to care for your feet Foot hygiene  Wash your feet daily with warm water and mild soap. Do not use hot water. Then, pat your feet and the areas between your toes until they are completely dry. Do not soak your feet as this can dry your skin.  Trim your toenails straight across. Do not dig under them or around the cuticle. File the edges of your nails with an emery board or nail file.  Apply a moisturizing lotion or petroleum jelly to the skin on your feet and to dry, brittle toenails. Use lotion that does not contain alcohol and is unscented. Do not apply lotion between your toes. Shoes and socks  Wear clean socks or stockings every day. Make sure they are not too tight. Do not wear knee-high stockings since they may decrease blood flow to your legs.  Wear shoes that fit properly and have enough cushioning. Always look in your shoes before you put them on to be sure there are no objects inside.  To break in new shoes, wear them for just a few hours a day. This prevents injuries on your feet. Wounds, scrapes, corns, and calluses  Check your feet daily for blisters, cuts, bruises, sores, and redness. If you cannot see the bottom of your feet, use a mirror or ask someone for help.  Do not cut corns or calluses or try to remove them with medicine.  If you  find a minor scrape, cut, or break in the skin on your feet, keep it and the skin around it clean and dry. You may clean these areas with mild soap and water. Do not clean the area with peroxide, alcohol, or iodine.  If you have a wound, scrape, corn, or callus on your foot, look at it several times a day to make sure it is healing and not infected. Check for: ? Redness, swelling, or pain. ? Fluid or blood. ? Warmth. ? Pus or a bad smell. General instructions  Do not cross your legs. This may decrease blood flow to your feet.  Do not use heating pads or hot water bottles on your feet. They may burn your skin. If you have lost feeling in your feet or legs, you may not know this is happening until it is too late.  Protect your feet from hot and cold by wearing shoes, such as at the beach or on hot pavement.  Schedule a complete foot exam at least once a year (annually) or more often if you have foot problems. If you have foot problems, report any cuts, sores, or bruises to your health care provider immediately. Contact a health care provider if:  You have a medical condition that increases your risk of infection and you have any cuts, sores, or bruises on your feet.  You have an injury that is not   healing.  You have redness on your legs or feet.  You feel burning or tingling in your legs or feet.  You have pain or cramps in your legs and feet.  Your legs or feet are numb.  Your feet always feel cold.  You have pain around a toenail. Get help right away if:  You have a wound, scrape, corn, or callus on your foot and: ? You have pain, swelling, or redness that gets worse. ? You have fluid or blood coming from the wound, scrape, corn, or callus. ? Your wound, scrape, corn, or callus feels warm to the touch. ? You have pus or a bad smell coming from the wound, scrape, corn, or callus. ? You have a fever. ? You have a red line going up your leg. Summary  Check your feet every day  for cuts, sores, red spots, swelling, and blisters.  Moisturize feet and legs daily.  Wear shoes that fit properly and have enough cushioning.  If you have foot problems, report any cuts, sores, or bruises to your health care provider immediately.  Schedule a complete foot exam at least once a year (annually) or more often if you have foot problems. This information is not intended to replace advice given to you by your health care provider. Make sure you discuss any questions you have with your health care provider. Document Revised: 11/14/2018 Document Reviewed: 03/25/2016 Elsevier Patient Education  2020 Elsevier Inc.  

## 2019-08-16 NOTE — Progress Notes (Signed)
Subjective: Jeffrey [name] is a pleasant 79 y.o. male patient seen today at risk foot care with history of diabetic neuropathy and painful mycotic nails b/l that are difficult to trim. Pain interferes with ambulation. Aggravating factors include wearing enclosed shoe gear. Pain is relieved with periodic professional debridement.  Patient Active Problem List   Diagnosis Date Noted  . DNR (do not resuscitate) 04/11/2019  . Health maintenance examination 07/27/2016  . Hypertropia of right eye 03/22/2016  . Medicare annual wellness visit, subsequent 07/23/2015  . Advanced care planning/counseling discussion 07/23/2015  . Diplopia 07/15/2015  . Anterior cerebral circulation hemorrhagic infarction ([place]) 11/24/2014  . Hemiparesis affecting right side as late effect of cerebrovascular accident ([place]) 09/05/2014  . 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 Thrombocytopenia ([place]) 06/05/2014  . Chronic diastolic heart failure, NYHA class 1 ([place]) 06/05/2014  . Benign paroxysmal positional vertigo 04/28/2014  . Right carotid bruit   . Seasonal allergic rhinitis   . Trigger thumb of left hand 10/12/2011  . Dysphonia 01/04/2011  . Gout   . Adenomatous polyps   . CAD (coronary artery disease)   . Hypertension associated with diabetes ([place])   . Hyperlipidemia associated with type 2 diabetes mellitus ([place])   . Type 2 diabetes, controlled, with neuropathy ([place])   . Ejection fraction     Current Outpatient Medications on File Prior to Visit  Medication Sig Dispense Refill  . atorvastatin (LIPITOR) 80 MG tablet Take 80 mg by mouth at bedtime.    . baclofen (LIORESAL) 10 MG tablet Take 0.5 tablets (5 mg total) by mouth 3 (three) times daily. 60 each 3  . Cholecalciferol (VITAMIN D3) 5000 UNITS TABS Take 5,000 Units by mouth once a week.    . clopidogrel (PLAVIX) 75 MG tablet Take 1 tablet (75 mg total) by mouth daily. 30 tablet 6  . fosinopril ([name]) 40 MG tablet Take 40 mg by mouth daily.    .  furosemide (LASIX) 40 MG tablet TAKE 1 TABLET BY MOUTH EVERY DAY 90 tablet 1  . insulin aspart (NOVOLOG FLEXPEN) 100 UNIT/ML FlexPen Inject 5-6 Units into the skin as directed. 7 units with breakfast 7 units with supper    . insulin glargine (LANTUS) 100 UNIT/ML injection Inject 18 Units into the skin at bedtime.  10 mL   . LACTOBACILLUS PO Take 1 capsule by mouth daily. [facility] colon [facility].  Takes M/W/F    . loratadine (CLARITIN) 10 MG tablet Take 10 mg by mouth daily as needed.     [name] losartan (COZAAR) 50 MG tablet Take 50 mg by mouth daily.    . metoprolol tartrate (LOPRESSOR) 25 MG tablet Take 1 tablet (25 mg total) by mouth 2 (two) times daily.    . sertraline (ZOLOFT) 25 MG tablet Take 1 tablet (25 mg total) by mouth daily. 30 tablet 11   No current facility-administered medications on file prior to visit.    Allergies  Allergen Reactions  . Niacin Other (See Comments)    REACTION: intolerant,not allergic="flushing,hot flashes,turning red" REACTION: intolerant,not allergic="flushing,hot flashes,turning red"    Objective: Physical Exam  General: Jeffrey [name] is a pleasant 79 y.o. Caucasian male, in NAD. AAO x 3.   Vascular:  Capillary fill time to digits <3 seconds b/l lower extremities. Palpable DP pulses b/l. Palpable PT pulses b/l. Pedal hair absent b/l. Skin temperature gradient within normal limits b/l. Trace edema noted b/l feet.  Dermatological:  Pedal skin with normal turgor, texture and tone bilaterally.  No open wounds bilaterally. Toenails 1-5 b/l elongated, discolored, dystrophic, thickened, crumbly with subungual debris and tenderness to dorsal palpation.  Musculoskeletal:  Normal muscle strength 5/5 to all lower extremity muscle groups bilaterally. No pain crepitus or joint limitation noted with ROM b/l. No gross bony deformities bilaterally.  Neurological:  Protective sensation diminished with 10g monofilament b/l.  Assessment and Plan:  1. Pain due to  onychomycosis of toenails of both feet   2. DM type 2 with diabetic peripheral neuropathy ([place])    -Examined patient. -Continue diabetic foot care principles. -Toenails 1-5 b/l were debrided in length and girth with sterile nail nippers and dremel without iatrogenic bleeding.  -Patient to report any pedal injuries to medical professional immediately. -Patient to continue soft, supportive shoe gear daily. -Patient/POA to call should there be question/concern in the interim.  Return in about 3 months (around 11/14/2019) for diabetic nail trim.  Marzetta Board, DPM

## 2019-08-19 NOTE — Chronic Care Management (AMB) (Deleted)
Chronic Care Management Pharmacy  Name: Jeffrey [name]  MRN: [id number] DOB: 1940-06-08  Chief Complaint/ HPI  Jeffrey [name],  79 y.o. , male presents for their Initial CCM visit with the clinical pharmacist via telephone due to COVID-19 Pandemic.  DAUGHTER WILL ASSIST WITH CCM CALL.  CHRISTIE WILLIAMSON @ [phone].   PCP : [name], MD  Their chronic conditions include: HTN, CAD, Diastolic heart failure, G8JE, hyperlipidiemia, gout, depression  Office Visits: 01/15/19: Patient presented to Dr. Danise Mina for annual physical. A1C worsened to 9.2%. Sennosides discontinued.   Consult Visit: 08/14/19: Patient presented to Dr. Elisha Ponder (Podiatry) for onychomycosis.  05/14/19: Patient presented to Dr. Elisha Ponder (Podiatry) for onychomycosis.  05/10/19: Patient presented to Dr. Nevada Crane (Dermatology) for epidermal cyst. 05/06/19: Patient presented to Dr. Buddy Duty (Endocrinology) for T2DM follow-up.   Medications: Outpatient Encounter Medications as of 08/22/2019  Medication Sig  . atorvastatin (LIPITOR) 80 MG tablet Take 80 mg by mouth at bedtime.  . baclofen (LIORESAL) 10 MG tablet Take 0.5 tablets (5 mg total) by mouth 3 (three) times daily.  . Cholecalciferol (VITAMIN D3) 5000 UNITS TABS Take 5,000 Units by mouth once a week.  . clopidogrel (PLAVIX) 75 MG tablet Take 1 tablet (75 mg total) by mouth daily.  . fosinopril ([name]) 40 MG tablet Take 40 mg by mouth daily.  . furosemide (LASIX) 40 MG tablet TAKE 1 TABLET BY MOUTH EVERY DAY  . insulin aspart (NOVOLOG FLEXPEN) 100 UNIT/ML FlexPen Inject 5-6 Units into the skin as directed. 7 units with breakfast 7 units with supper  . insulin glargine (LANTUS) 100 UNIT/ML injection Inject 18 Units into the skin at bedtime.   [name] LACTOBACILLUS PO Take 1 capsule by mouth daily. Philips colon health.  Takes M/W/F  . loratadine (CLARITIN) 10 MG tablet Take 10 mg by mouth daily as needed.   [name] losartan (COZAAR) 50 MG tablet Take 50 mg by mouth  daily.  . metoprolol tartrate (LOPRESSOR) 25 MG tablet Take 1 tablet (25 mg total) by mouth 2 (two) times daily.  . sertraline (ZOLOFT) 25 MG tablet Take 1 tablet (25 mg total) by mouth daily.   No facility-administered encounter medications on file as of 08/22/2019.     Current Diagnosis/Assessment:  Goals Addressed   None     Diabetes   Recent Relevant Labs: Lab Results  Component Value Date/Time   HGBA1C 9.2 (A) 01/15/2019 02:09 PM   HGBA1C 8.7 (A) 01/30/2018 12:00 AM   HGBA1C 8.7 01/30/2018 12:00 AM   HGBA1C 8.2 (H) 10/31/2017 12:50 PM   HGBA1C 8.1 07/26/2017 12:00 AM   MICROALBUR <0.7 10/31/2017 01:02 PM   MICROALBUR 0.7 07/26/2017 12:00 AM     Checking BG: {CHL HP Blood Glucose Monitoring Frequency:[phone]}  Recent FBG Readings: Recent pre-meal BG readings: *** Recent 2hr PP BG readings:  *** Recent HS BG readings: ***  Patient has failed these meds in past: *** Patient is currently {CHL Controlled/Uncontrolled:[phone]} on the following medications:   Novolog 7 units with breakfast, 7 units units with supper (0.18 units/kg)  Lantus 18 units QHS (0.23 units/kg)  Last diabetic Foot exam:  Lab Results  Component Value Date/Time   HMDIABEYEEXA Retinopathy (A) 11/30/2018 12:00 AM    Last diabetic Eye exam:  Lab Results  Component Value Date/Time   HMDIABFOOTEX podiatrist 08/31/2017 12:00 AM     We discussed: {CHL HP Upstream Pharmacy [phone]}  Plan  Continue {CHL HP Upstream Pharmacy [phone]},  Heart Failure   Type: Diastolic  Last  ejection fraction: >55% (07/03/15) NYHA Class: I (no actitivty limitation) AHA HF Stage: A (HF risk factors present)  Patient has failed these meds in past: *** Patient is currently {CHL Controlled/Uncontrolled:[phone]} on the following medications: ***  Fosinopril 40 mg daily   Furosemide 40 mg daily   Losartan 50 mg daily   Metoprolol tartrate 25 mg BID  We discussed {CHL HP Upstream  Pharmacy discussion:[phone]}  Plan  Continue {CHL HP Upstream Pharmacy Plans:[phone]}  and  Hypertension   BP today is:  {CHL HP UPSTREAM Pharmacist BP ranges:[phone]}  Office blood pressures are  BP Readings from Last 3 Encounters:  01/15/19 140/70  06/05/18 140/66  10/31/17 140/72   CMP Latest Ref Rng & Units 01/15/2019 10/31/2017 07/26/2017  Glucose 70 - 99 mg/dL 148(H) 189(H) -  BUN 6 - 23 mg/dL 15 12 -  Creatinine 0.40 - 1.50 mg/dL 1.00 0.90 0.8  Sodium 135 - 145 mEq/L 142 139 143  Potassium 3.5 - 5.1 mEq/L 3.9 3.9 3.5  Chloride 96 - 112 mEq/L 102 102 -  CO2 19 - 32 mEq/L 33(H) 32 -  Calcium 8.4 - 10.5 mg/dL 9.6 9.8 -  Total Protein 6.0 - 8.3 g/dL 6.7 6.9 -  Total Bilirubin 0.2 - 1.2 mg/dL 0.9 0.8 -  Alkaline Phos 39 - 117 U/L 111 101 88  AST 0 - 37 U/L 16 19 20   ALT 0 - 53 U/L 26 32 34   Patient has failed these meds in the past: *** Patient is currently {CHL Controlled/Uncontrolled:[phone]} on the following medications: ***  Fosinopril 40 mg daily   Furosemide 40 mg daily   Losartan 50 mg daily   Metoprolol tartrate 25 mg BID Patient checks BP at home {CHL HP BP Monitoring Frequency:[phone]}  Patient home BP readings are ranging: ***  We discussed {CHL HP Upstream Pharmacy discussion:[phone]}  Plan  Continue {CHL HP Upstream Pharmacy Plans:[phone]}   Hyperlipidemia   History of TIA and CAD  Lipid Panel     Component Value Date/Time   CHOL 152 01/15/2019 1501   CHOL 211 02/01/2011 0000   TRIG 184.0 (H) 01/15/2019 1501   TRIG 342 09/30/2013 0000   TRIG 342 09/30/2013 0000   HDL 34.50 (L) 01/15/2019 1501   LDLCALC 80 01/15/2019 1501   LDLCALC 98 09/30/2013 0000   LDLCALC 98 09/30/2013 0000   LDLDIRECT 67.0 10/31/2017 1250     The 10-year ASCVD risk score Mikey Bussing DC Jr., et al., 2013) is: 56.8%   Values used to calculate the score:     Age: 33 years     Sex: Male     Is Non-Hispanic African American: No     Diabetic: Yes      Tobacco smoker: No     Systolic Blood Pressure: 517 mmHg     Is BP treated: Yes     HDL Cholesterol: 34.5 mg/dL     Total Cholesterol: 152 mg/dL   Patient has failed these meds in past: *** Patient is currently {CHL Controlled/Uncontrolled:[phone]} on the following medications:   Atorvastatin 80 mg QHS   Clopidogrel 75 mg daily   We discussed:  {CHL HP Upstream Pharmacy discussion:[phone]}  Plan  Continue {CHL HP Upstream Pharmacy Plans:[phone]}   Depression   PHQ9 SCORE ONLY 01/15/2019 10/31/2017 07/27/2016  PHQ-9 Total Score 2 0 0   Patient has failed these meds in past: *** Patient is currently {CHL Controlled/Uncontrolled:[phone]} on the following medications:  Sertraline 25 mg daily  We discussed:  ***  Plan  Continue {CHL HP Upstream Pharmacy [phone]}   Misc/OTC   Baclofen 5 mg TID Vitamin D3 5000 units weekly  Lactobacillus MonWedFri Loratadine 10 mg daily PRN   We discussed:  ***  Plan  Continue {CHL HP Upstream Pharmacy VUYEB:[id number]}  Vaccines   Reviewed and discussed patient's vaccination history.    Immunization History  Administered Date(s) Administered  . Fluad Quad(high Dose 65+) 01/01/2019  . Influenza,inj,Quad PF,6+ Mos 12/22/2015  . Influenza-Unspecified 02/04/2014, 12/08/2014, 12/29/2016  . Pneumococcal Conjugate-13 02/04/2014  . Pneumococcal Polysaccharide-23 10/11/2012  . Zoster 03/08/2011  . Zoster Recombinat (Shingrix) 10/05/2017, 04/07/2018   Plan  Recommended patient receive *** vaccine in *** office/pharmacy.   Medication Management   Pt uses [facility] for all medications Uses pill box? {Yes or If no, why not?:20788} Pt endorses ***% compliance  We discussed: ***  Plan  {US Pharmacy HFGB:02111}    Follow up: *** month phone visit  ***

## 2019-08-22 ENCOUNTER — Ambulatory Visit: Payer: PPO

## 2019-08-22 ENCOUNTER — Other Ambulatory Visit: Payer: Self-pay

## 2019-08-22 DIAGNOSIS — E1140 Type 2 diabetes mellitus with diabetic neuropathy, unspecified: Secondary | ICD-10-CM

## 2019-08-22 DIAGNOSIS — E1159 Type 2 diabetes mellitus with other circulatory complications: Secondary | ICD-10-CM

## 2019-08-22 NOTE — Chronic Care Management (AMB) (Signed)
Chronic Care Management Pharmacy  Name: Jeffrey [name]  MRN: [id number] DOB: 02-12-1941  Chief Complaint/ HPI  Jeffrey [name],  79 y.o.male presents for their Initial CCM visit with the clinical pharmacist via telephone. Visit completed with daughter, Jeffrey [name].    PCP : Jeffrey Bush, MD  Their chronic conditions include: HTN, CAD, Diastolic heart failure, J3HL, hyperlipidiemia, gout, depression  Caregiver concerns: denies medication concerns   Office Visits: 01/15/19: Patient presented to Dr. Danise Mina for annual physical. A1C worsened to 9.2%. Trial off sertraline, monitor mood. Sennosides discontinued.   Consult Visit: 08/14/19: Patient presented to Dr. Elisha Ponder (Podiatry) for onychomycosis.  05/14/19: Patient presented to Dr. Elisha Ponder (Podiatry) for onychomycosis.  05/10/19: Patient presented to Dr. [place] Crane (Dermatology) for epidermal cyst. 05/06/19: Patient presented to Dr. Buddy Duty (Endocrinology) for T2DM follow-up.   Medications: Outpatient Encounter Medications as of 08/22/2019  Medication Sig  . atorvastatin (LIPITOR) 80 MG tablet Take 80 mg by mouth at bedtime.  . baclofen (LIORESAL) 10 MG tablet Take 0.5 tablets (5 mg total) by mouth 3 (three) times daily.  . Cholecalciferol (VITAMIN D3) 5000 UNITS TABS Take 5,000 Units by mouth once a week.  . clopidogrel (PLAVIX) 75 MG tablet Take 1 tablet (75 mg total) by mouth daily.  . fosinopril ([name]) 40 MG tablet Take 40 mg by mouth daily.  . furosemide (LASIX) 40 MG tablet TAKE 1 TABLET BY MOUTH EVERY DAY  . insulin aspart (NOVOLOG FLEXPEN) 100 UNIT/ML FlexPen Inject 5-6 Units into the skin as directed. 7 units with breakfast 7 units with supper  . insulin glargine (LANTUS) 100 UNIT/ML injection Inject 18 Units into the skin at bedtime.   [name] LACTOBACILLUS PO Take 1 capsule by mouth daily. Philips colon health.  Takes M/W/F  . loratadine (CLARITIN) 10 MG tablet Take 10 mg by mouth daily as needed.   [name] losartan (COZAAR) 50 MG tablet  Take 50 mg by mouth daily.  . metoprolol tartrate (LOPRESSOR) 25 MG tablet Take 1 tablet (25 mg total) by mouth 2 (two) times daily.  . sertraline (ZOLOFT) 25 MG tablet Take 1 tablet (25 mg total) by mouth daily.   No facility-administered encounter medications on file as of 08/22/2019.   Current Diagnosis/Assessment:   Emergency planning/management officer Strain: Low Risk   . Difficulty of Paying Living Expenses: Not very hard   Goals    . Increase physical activity     Starting 10/31/2017, I will attempt to do physical therapy exercises for at least 15 minutes 5 days per week.     [name] Pharmacy Care Plan     CARE PLAN ENTRY  Current Barriers:  . Chronic Disease Management support, education, and care coordination needs related to Hypertension and Diabetes   Hypertension BP Readings from Last 3 Encounters:  08/27/19 (!) 156/70  01/15/19 140/70  06/05/18 140/66 .  Pharmacist Clinical Goal(s): o Over the next 3 months, patient will work with PharmD and providers to maintain BP goal <140/90 mmHg . Current regimen:   Fosinopril 40 mg - 1 tablet daily   Losartan 50 mg - 1 tablet daily   Metoprolol tartrate 25 mg- 1 tablet twice daily . Interventions: o Reviewed safety, fosinopril and losartan are duplicate therapies and should not be taken together.  . Patient self care activities - Over the next 3 months, patient will: o Please review patients medications and see if patient is taking fosinopril and losartan. Call [facility] if taking both.  o Check blood pressure 5-7 days leading up  to appointments, document, and provide at each visit o Ensure daily salt intake < 2300 mg/day  Diabetes Lab Results  Component Value Date/Time   HGBA1C 8.1 (A) 08/27/2019 11:44 AM   HGBA1C 9.2 (A) 01/15/2019 02:09 PM   HGBA1C 8.7 (A) 01/30/2018 12:00 AM   HGBA1C 8.7 01/30/2018 12:00 AM   HGBA1C 8.2 (H) 10/31/2017 12:50 PM   HGBA1C 8.1 07/26/2017 12:00 AM .  Pharmacist Clinical Goal(s): o Over the next 3 months,  patient will work with PharmD and providers to achieve A1c goal <7% . Current regimen:   Novolog - Inject 10 units with breakfast, 10 units units with supper   Lantus - Inject 26 units at bedtime   Per Jeffrey [name], patient also supposed to start Trulicity 00/76 but [facility] not [facility] . Interventions: o [facility] to see if [facility] was a problem [facility] Trulicity prescription. Left voicemail with pharmacist.  . Patient self care activities - Over the next 3 months, patient will: o Check blood sugar daily before breakfast, document, and provide at future appointment o Contact provider with any episodes of hypoglycemia o Please call Sharyn Lull if Trulicity not approved through [place]  Initial goal documentation       Diabetes   Followed by Dr. Buddy Duty - every 3-4 months (next appt is 09/06/19) Recent Relevant Labs: Lab Results  Component Value Date/Time   HGBA1C 9.2 (A) 01/15/2019 02:09 PM   HGBA1C 8.7 (A) 01/30/2018 12:00 AM   HGBA1C 8.7 01/30/2018 12:00 AM   HGBA1C 8.2 (H) 10/31/2017 12:50 PM   HGBA1C 8.1 07/26/2017 12:00 AM   MICROALBUR <0.7 10/31/2017 01:02 PM   MICROALBUR 0.7 07/26/2017 12:00 AM    Checking BG: Daily every morning Recent FBG Readings: 169(6/17), 160 (6/14), 78 (6/11)  A1c goal < 7% Patient has failed these meds in past: none reported Patient is currently uncontrolled on the following medications:   Novolog - Inject 10 units with breakfast, 10 units units with supper   Lantus - Inject 26 units at bedtime   Last diabetic eye exam:  Lab Results  Component Value Date/Time   HMDIABEYEEXA Retinopathy (A) 11/30/2018 12:00 AM    Last diabetic foot exam:  Lab Results  Component Value Date/Time   HMDIABFOOTEX podiatrist 08/31/2017 12:00 AM    We discussed: last visit with Dr. Buddy Duty, daughter reports pt was recommended to start Trulicity 04/08/61, but has not been approved through [facility], Dr. Percell Miller PCP [phone]).   Plan: Continue current medications;  Contact [facility] to see if Tr[facility]icity can be filled. Left voicemail with [facility] pharmacy 08/22/19.   Heart Failure   Type: Diastolic  Last ejection fraction: >55% (07/03/15) NYHA Class: I (no actitivty limitation) AHA HF Stage: A (HF risk factors present)  Patient has failed these meds in past: none reported Patient is currently controlled on the following medications:   Fosinopril 40 mg - 1 tablet daily   Furosemide 20 mg - 1 tablet daily   Losartan 50 mg - 1 tablet daily   Metoprolol tartrate 25 mg- 1 tablet BID   We discussed: denies SOB or swelling  Plan: Continue current medications   Hypertension   Office blood pressures are  BP Readings from Last 3 Encounters:  01/15/19 140/70  06/05/18 140/66  10/31/17 140/72   CMP Latest Ref Rng & Units 01/15/2019 10/31/2017 07/26/2017  Glucose 70 - 99 mg/dL 148(H) 189(H) -  BUN 6 - 23 mg/dL 15 12 -  Creatinine 0.40 - 1.50 mg/dL 1.00 0.90  0.8  Sodium 135 - 145 mEq/L 142 139 143  Potassium 3.5 - 5.1 mEq/L 3.9 3.9 3.5  Chloride 96 - 112 mEq/L 102 102 -  CO2 19 - 32 mEq/L 33(H) 32 -  Calcium 8.4 - 10.5 mg/dL 9.6 9.8 -  Total Protein 6.0 - 8.3 g/dL 6.7 6.9 -  Total Bilirubin 0.2 - 1.2 mg/dL 0.9 0.8 -  Alkaline Phos 39 - 117 U/L 111 101 88  AST 0 - 37 U/L 16 19 20   ALT 0 - 53 U/L 26 32 34   BP goal < 140/90 mmHg Patient has failed these meds in the past: none reported Patient is currently controlled on the following medications:   Fosinopril 40 mg - 1 tablet daily   Losartan 50 mg - 1 tablet daily   Metoprolol tartrate 25 mg- 1 tablet BID   Patient checks BP at home infrequently Patient home BP readings are ranging: none reported Duplicate therapy - ACE and ARB, caregiver to confirm patient is taking both and call pharmacist  We discussed: caregiver asked about frequency of home monitoring, recommended checking BP 1-2x per month, daily leading up to appointments, or when symptomatic  Plan: Continue current medications; Caregiver  to check medication list and ensure patient is not taking fosinopril and losartan.   Hyperlipidemia   History of TIA and CAD  Lipid Panel     Component Value Date/Time   CHOL 152 01/15/2019 1501   CHOL 211 02/01/2011 0000   TRIG 184.0 (H) 01/15/2019 1501   TRIG 342 09/30/2013 0000   TRIG 342 09/30/2013 0000   HDL 34.50 (L) 01/15/2019 1501   LDLCALC 80 01/15/2019 1501   LDLCALC 98 09/30/2013 0000   LDLCALC 98 09/30/2013 0000   LDLDIRECT 67.0 10/31/2017 1250    LDL goal < 70 Patient has failed these meds in past: none reported Patient is currently controlled on the following medications:   Atorvastatin 80 mg - 1 tablet qhs   Clopidogrel 75 mg - 1 tablet daily   We discussed: denies taking aspirin; denies adverse effects   Plan: Continue current medications   Depression   PHQ9 SCORE ONLY 01/15/2019 10/31/2017 07/27/2016  PHQ-9 Total Score 2 0 0   Patient has failed these meds in past: none  Patient is currently controlled on the following medications:  Sertraline 25 mg - 1 tablet daily at breakfast   We discussed: started after stroke for anxiety; tried going off of sertraline and after about 1 week, wanted to resume  Plan: Continue current medications  Misc/OTC    Baclofen 5 mg - 1 TID (started post-stroke)  Vitamin D3 5000 units - 1 weekly   Lactobacillus - 1 on M,W,F  Loratadine 10 mg - 1 daily PRN   We discussed: reviewed medications with caregiver, confirms patient is still taking above  Plan: Continue current medications  Vaccines   Reviewed and discussed patient's vaccination history. Reports receiving both COVID-19 vaccines.   Immunization History  Administered Date(s) Administered  . Fluad Quad(high Dose 65+) 01/01/2019  . Influenza,inj,Quad PF,6+ Mos 12/22/2015  . Influenza-Unspecified 02/04/2014, 12/08/2014, 12/29/2016  . Pneumococcal Conjugate-13 02/04/2014  . Pneumococcal Polysaccharide-23 10/11/2012  . Zoster 03/08/2011  . Zoster  Recombinat (Shingrix) 10/05/2017, 04/07/2018   Plan: Remain up to date on vaccinations. Update tetanus vaccine with injury.  Pharmacy: gets medications through [place] Follow up:  3 months (telephone)  Debbora Dus, PharmD Clinical Pharmacist [facility] [phone]

## 2019-08-22 NOTE — Patient Instructions (Addendum)
Dear Jeffrey [name],  It was a pleasure meeting your daughter, Jeffrey [name], during our initial appointment on August 22, 2019. Below is a summary of the goals we discussed and components of chronic care management. Please contact me anytime with questions or concerns.   Visit Information Goals    . Increase physical activity     Starting 10/31/2017, I will attempt to do physical therapy exercises for at least 15 minutes 5 days per week.     [name] Care Plan     CARE PLAN ENTRY  Current Barriers:  . Chronic Disease Management support, education, and care coordination needs related to Hypertension and Diabetes   Hypertension BP Readings from Last 3 Encounters:  08/27/19 (!) 156/70  01/15/19 140/70  06/05/18 140/66 .  Pharmacist Clinical Goal(s): o Over the next 3 months, patient will work with PharmD and providers to maintain BP goal <140/90 mmHg . Current regimen:   Fosinopril 40 mg - 1 tablet daily   Losartan 50 mg - 1 tablet daily   Metoprolol tartrate 25 mg- 1 tablet twice daily . Interventions: o Reviewed safety, fosinopril and losartan are duplicate therapies and should not be taken together.  . Patient self care activities - Over the next 3 months, patient will: o Please review patients medications and see if patient is taking fosinopril and losartan. Call [facility] if taking both.  o Check blood pressure 5-7 days leading up to appointments, document, and provide at each visit o Ensure daily salt intake < 2300 mg/day  Diabetes Lab Results  Component Value Date/Time   HGBA1C 8.1 (A) 08/27/2019 11:44 AM   HGBA1C 9.2 (A) 01/15/2019 02:09 PM   HGBA1C 8.7 (A) 01/30/2018 12:00 AM   HGBA1C 8.7 01/30/2018 12:00 AM   HGBA1C 8.2 (H) 10/31/2017 12:50 PM   HGBA1C 8.1 07/26/2017 12:00 AM .  Pharmacist Clinical Goal(s): o Over the next 3 months, patient will work with PharmD and providers to achieve A1c goal <7% . Current regimen:   Novolog - Inject 10 units with breakfast, 10  units units with supper   Lantus - Inject 26 units at bedtime   Per Jeffrey [name], patient also supposed to start Trulicity 63/87 but [facility] has not [facility] . Interventions: o [facility] to see if [facility] was a [facility] Trulicity prescription. Left voicemail with pharmacist.  . Patient self care activities - Over the next 3 months, patient will: o Check blood sugar daily before breakfast, document, and provide at future appointment o Contact provider with any episodes of hypoglycemia o Please call Jeffrey [name] if Trulicity not approved through [place]  Initial goal documentation      Jeffrey [name] was given information about Chronic Care Management services today including:  1. CCM service includes personalized support from designated clinical staff supervised by his physician, including individualized plan of care and coordination with other care providers 2. 24/7 contact phone numbers for assistance for urgent and routine care needs. 3. Standard insurance, coinsurance, copays and deductibles apply for chronic care management only during months in which we provide at least 20 minutes of these services. Most insurances cover these services at 100%, however patients may be responsible for any copay, coinsurance and/or deductible if applicable. This service may help you avoid the need for more expensive face-to-face services. 4. Only one practitioner may furnish and bill the service in a calendar month. 5. The patient may stop CCM services at any time (effective at the end of the month) by phone call to  the office staff.  Patient agreed to services and verbal consent obtained.   The patient verbalized understanding of instructions provided today and agreed to receive a mailed copy of patient instruction and/or educational materials. Telephone follow up appointment with pharmacy team member scheduled for: 11/22/19 at 1:00 PM (telephone)  Debbora Dus, PharmD Clinical Pharmacist [facility]  [facility] [phone]   Preventing Hypoglycemia Hypoglycemia occurs when the level of sugar (glucose) in the blood is too low. Hypoglycemia can happen in people who do or do not have diabetes (diabetes mellitus). It can develop quickly, and it can be a medical emergency. For most people with diabetes, a blood glucose level below 70 mg/dL (3.9 mmol/L) is considered hypoglycemia. Glucose is a type of sugar that provides the body's main source of energy. Certain hormones (insulin and glucagon) control the level of glucose in the blood. Insulin lowers blood glucose, and glucagon increases blood glucose. Hypoglycemia can result from having too much insulin in the bloodstream, or from not eating enough food that contains glucose. Your risk for hypoglycemia is higher:  If you take insulin or diabetes medicines to help lower your blood glucose or help your body make more insulin.  If you skip or delay a meal or snack.  If you are ill.  During and after exercise. You can prevent hypoglycemia by working with your health care provider to adjust your meal plan as needed and by taking other precautions. How can hypoglycemia affect me? Mild symptoms Mild hypoglycemia may not cause any symptoms. If you do have symptoms, they may include:  Hunger.  Anxiety.  Sweating and feeling clammy.  Dizziness or feeling light-headed.  Sleepiness.  Nausea.  Increased heart rate.  Headache.  Blurry vision.  Irritability.  Tingling or numbness around the mouth, lips, or tongue.  A change in coordination.  Restless sleep. If mild hypoglycemia is not recognized and treated, it can quickly become moderate or severe hypoglycemia. Moderate symptoms Moderate hypoglycemia can cause:  Mental confusion and poor judgment.  Behavior changes.  Weakness.  Irregular heartbeat. Severe symptoms Severe hypoglycemia is a medical emergency. It can cause:  Fainting.  Seizures.  Loss of consciousness  (coma).  Death. What nutrition changes can be made?  Work with your health care provider or diet and nutrition specialist (dietitian) to make a healthy meal plan that is right for you. Follow your meal plan carefully.  Eat meals at regular times.  If recommended by your health care provider, have snacks between meals.  Donot skip or delay meals or snacks. You can be at risk for hypoglycemia if you are not getting enough carbohydrates. What lifestyle changes can be made?   Work closely with your health care provider to manage your blood glucose. Make sure you know: ? Your goal blood glucose levels. ? How and when to check your blood glucose. ? The symptoms of hypoglycemia. It is important to treat it right away to keep it from becoming severe.  Do not drink alcohol on an empty stomach.  When you are ill, check your blood glucose more often than usual. Follow your sick day plan whenever you cannot eat or drink normally. Make this plan in advance with your health care provider.  Always check your blood glucose before, during, and after exercise. How is this treated? This condition can often be treated by immediately eating or drinking something that contains sugar, such as:  Fruit juice, 4-6 oz (120-150 mL).  Regular (not diet) soda, 4-6  oz (120-150 mL).  Low-fat milk, 4 oz (120 mL).  Several pieces of hard candy.  Sugar or honey, 1 Tbsp (15 mL). Treating hypoglycemia if you have diabetes If you are alert and able to swallow safely, follow the 15:15 rule:  Take 15 grams of a rapid-acting carbohydrate. Talk with your health care provider about how much you should take.  Rapid-acting options include: ? Glucose pills (take 15 grams). ? 6-8 pieces of hard candy. ? 4-6 oz (120-150 mL) of fruit juice. ? 4-6 oz (120-150 mL) of regular (not diet) soda.  Check your blood glucose 15 minutes after you take the carbohydrate.  If the repeat blood glucose level is still at or below  70 mg/dL (3.9 mmol/L), take 15 grams of a carbohydrate again.  If your blood glucose level does not increase above 70 mg/dL (3.9 mmol/L) after 3 tries, seek emergency medical care.  After your blood glucose level returns to normal, eat a meal or a snack within 1 hour. Treating severe hypoglycemia Severe hypoglycemia is when your blood glucose level is at or below 54 mg/dL (3 mmol/L). Severe hypoglycemia is a medical emergency. Get medical help right away. If you have severe hypoglycemia and you cannot eat or drink, you may need an injection of glucagon. A family member or close friend should learn how to check your blood glucose and how to give you a glucagon injection. Ask your health care provider if you need to have an emergency glucagon injection kit available. Severe hypoglycemia may need to be treated in a hospital. The treatment may include getting glucose through an IV. You may also need treatment for the cause of your hypoglycemia. Where to find more information  American Diabetes Association: www.diabetes.[employer] of Diabetes and Digestive and Kidney Diseases: [employer] Contact a health care provider if:  You have problems keeping your blood glucose in your target range.  You have frequent episodes of hypoglycemia. Get help right away if:  You continue to have hypoglycemia symptoms after eating or drinking something containing glucose.  Your blood glucose level is at or below 54 mg/dL (3 mmol/L).  You faint.  You have a seizure. These symptoms may represent a serious problem that is an emergency. Do not wait to see if the symptoms will go away. Get medical help right away. Call your local emergency services (911 in the [place] Summary  Know the symptoms of hypoglycemia, and when you are at risk for it (such as during exercise or when you are sick). Check your blood glucose often when you are at risk for hypoglycemia.  Hypoglycemia can develop quickly,  and it can be dangerous if it is not treated right away. If you have a history of severe hypoglycemia, make sure you know how to use your glucagon injection kit.  Make sure you know how to treat hypoglycemia. Keep a carbohydrate snack available when you may be at risk for hypoglycemia. This information is not intended to replace advice given to you by your health care provider. Make sure you discuss any questions you have with your health care provider. Document Revised: 06/15/2018 Document Reviewed: 10/19/2016 Elsevier Patient Education  [place].

## 2019-08-27 ENCOUNTER — Encounter: Payer: Self-pay | Admitting: Family Medicine

## 2019-08-27 ENCOUNTER — Ambulatory Visit (INDEPENDENT_AMBULATORY_CARE_PROVIDER_SITE_OTHER): Payer: PPO | Admitting: Family Medicine

## 2019-08-27 ENCOUNTER — Other Ambulatory Visit: Payer: Self-pay

## 2019-08-27 VITALS — BP 156/70 | HR 59 | Temp 97.6°F | Ht 67.5 in | Wt 174.6 lb

## 2019-08-27 DIAGNOSIS — F0631 Mood disorder due to known physiological condition with depressive features: Secondary | ICD-10-CM

## 2019-08-27 DIAGNOSIS — E1159 Type 2 diabetes mellitus with other circulatory complications: Secondary | ICD-10-CM | POA: Diagnosis not present

## 2019-08-27 DIAGNOSIS — I10 Essential (primary) hypertension: Secondary | ICD-10-CM

## 2019-08-27 DIAGNOSIS — E1140 Type 2 diabetes mellitus with diabetic neuropathy, unspecified: Secondary | ICD-10-CM

## 2019-08-27 DIAGNOSIS — R3915 Urgency of urination: Secondary | ICD-10-CM | POA: Diagnosis not present

## 2019-08-27 DIAGNOSIS — I5032 Chronic diastolic (congestive) heart failure: Secondary | ICD-10-CM

## 2019-08-27 DIAGNOSIS — I69351 Hemiplegia and hemiparesis following cerebral infarction affecting right dominant side: Secondary | ICD-10-CM | POA: Diagnosis not present

## 2019-08-27 DIAGNOSIS — I69398 Other sequelae of cerebral infarction: Secondary | ICD-10-CM | POA: Diagnosis not present

## 2019-08-27 LAB — POC URINALSYSI DIPSTICK (AUTOMATED)
Bilirubin, UA: NEGATIVE
Blood, UA: NEGATIVE
Glucose, UA: NEGATIVE
Ketones, UA: NEGATIVE
Leukocytes, UA: NEGATIVE
Nitrite, UA: NEGATIVE
Protein, UA: NEGATIVE
Spec Grav, UA: 1.015 (ref 1.010–1.025)
Urobilinogen, UA: 0.2 E.U./dL
pH, UA: 6 (ref 5.0–8.0)

## 2019-08-27 LAB — POCT GLYCOSYLATED HEMOGLOBIN (HGB A1C): Hemoglobin A1C: 8.1 % — AB (ref 4.0–5.6)

## 2019-08-27 MED ORDER — FUROSEMIDE 20 MG PO TABS: 10.0000 mg | ORAL_TABLET | Freq: Every day | ORAL | 1 refills | Status: DC

## 2019-08-27 MED ORDER — SAW PALMETTO (SERENOA REPENS) 80 MG PO CAPS
80.0000 mg | ORAL_CAPSULE | Freq: Every day | ORAL | Status: DC
Start: 2019-08-27 — End: 2020-04-15

## 2019-08-27 NOTE — Assessment & Plan Note (Signed)
Mild pedal edema on lasix 20mg  daily - rec continue this.

## 2019-08-27 NOTE — Assessment & Plan Note (Signed)
Did not do well coming off sertraline - will continue 25mg  daily.

## 2019-08-27 NOTE — Assessment & Plan Note (Signed)
Chronic, stable. Takes baclofen.

## 2019-08-27 NOTE — Assessment & Plan Note (Addendum)
Chronic, deteriorated. Patient attributes to increased recent stressors (as per HPI). No changes made but I asked daughter to monitor BP daily at home this week and call us Friday with readings - that will determine need to change antihypertensive regimen.  I did also recommend low salt diet <1.5gm/day, DASH diet handout provided today as well.

## 2019-08-27 NOTE — Assessment & Plan Note (Signed)
Appreciate endo care. Latest A1c improved to 8.1%.

## 2019-08-27 NOTE — Patient Instructions (Addendum)
Urinalysis today.  Ok to try lasix 10mg  daily if needed but if increasing blood pressure or leg swelling or shortness of breath, return to 20mg  daily. Let me know how you do with this.  Blood pressures are staying high: Work on low salt/sodium diet - goal <1.5gm (1,500mg ) per day. Eat a diet high in fruits/vegetables and whole grains.  Look into mediterranean and DASH diet.  Ok to try saw palmetto for urinary symptoms.  Less caffeine, more water can also help.  Good to see you today  Return as needed or in 6 months for next physical.    DASH Eating Plan DASH stands for "Dietary Approaches to Stop Hypertension." The DASH eating plan is a healthy eating plan that has been shown to reduce high blood pressure (hypertension). It may also reduce your risk for type 2 diabetes, heart disease, and stroke. The DASH eating plan may also help with weight loss. What are tips for following this plan?  General guidelines  Avoid eating more than 2,300 mg (milligrams) of salt (sodium) a day. If you have hypertension, you may need to reduce your sodium intake to 1,500 mg a day.  Limit alcohol intake to no more than 1 drink a day for nonpregnant women and 2 drinks a day for men. One drink equals 12 oz of beer, 5 oz of wine, or 1 oz of hard liquor.  Work with your health care provider to maintain a healthy body weight or to lose weight. Ask what an ideal weight is for you.  Get at least 30 minutes of exercise that causes your heart to beat faster (aerobic exercise) most days of the week. Activities may include walking, swimming, or biking.  Work with your health care provider or diet and nutrition specialist (dietitian) to adjust your eating plan to your individual calorie needs. Reading food labels   Check food labels for the amount of sodium per serving. Choose foods with less than 5 percent of the Daily Value of sodium. Generally, foods with less than 300 mg of sodium per serving fit into this eating  plan.  To find whole grains, look for the word "whole" as the first word in the ingredient list. Shopping  Buy products labeled as "low-sodium" or "no salt added."  Buy fresh foods. Avoid canned foods and premade or frozen meals. Cooking  Avoid adding salt when cooking. Use salt-free seasonings or herbs instead of table salt or sea salt. Check with your health care provider or pharmacist before using salt substitutes.  Do not fry foods. Cook foods using healthy methods such as baking, boiling, grilling, and broiling instead.  Cook with heart-healthy oils, such as olive, canola, soybean, or sunflower oil. Meal planning  Eat a balanced diet that includes: ? 5 or more servings of fruits and vegetables each day. At each meal, try to fill half of your plate with fruits and vegetables. ? Up to 6-8 servings of whole grains each day. ? Less than 6 oz of lean meat, poultry, or fish each day. A 3-oz serving of meat is about the same size as a deck of cards. One egg equals 1 oz. ? 2 servings of low-fat dairy each day. ? A serving of nuts, seeds, or beans 5 times each week. ? Heart-healthy fats. Healthy fats called Omega-3 fatty acids are found in foods such as flaxseeds and coldwater fish, like sardines, salmon, and mackerel.  Limit how much you eat of the following: ? Canned or prepackaged foods. ? Food  that is high in trans fat, such as fried foods. ? Food that is high in saturated fat, such as fatty meat. ? Sweets, desserts, sugary drinks, and other foods with added sugar. ? Full-fat dairy products.  Do not salt foods before eating.  Try to eat at least 2 vegetarian meals each week.  Eat more home-cooked food and less restaurant, buffet, and fast food.  When eating at a restaurant, ask that your food be prepared with less salt or no salt, if possible. What foods are recommended? The items listed may not be a complete list. Talk with your dietitian about what dietary choices are best  for you. Grains Whole-grain or whole-wheat bread. Whole-grain or whole-wheat pasta. Brown rice. [place]. Bulgur. Whole-grain and low-sodium cereals. Pita bread. Low-fat, low-sodium crackers. Whole-wheat flour tortillas. Vegetables Fresh or frozen vegetables (raw, steamed, roasted, or grilled). Low-sodium or reduced-sodium tomato and vegetable juice. Low-sodium or reduced-sodium tomato sauce and tomato paste. Low-sodium or reduced-sodium canned vegetables. Fruits All fresh, dried, or frozen fruit. Canned fruit in natural juice (without added sugar). Meat and other protein foods Skinless chicken or [place]. Ground chicken or [place]. Pork with fat trimmed off. Fish and seafood. Egg whites. Dried beans, peas, or lentils. Unsalted nuts, nut butters, and seeds. Unsalted canned beans. Lean cuts of beef with fat trimmed off. Low-sodium, lean deli meat. Dairy Low-fat (1%) or fat-free (skim) milk. Fat-free, low-fat, or reduced-fat cheeses. Nonfat, low-sodium ricotta or cottage cheese. Low-fat or nonfat yogurt. Low-fat, low-sodium cheese. Fats and oils Soft margarine without trans fats. Vegetable oil. Low-fat, reduced-fat, or light mayonnaise and salad dressings (reduced-sodium). Canola, safflower, olive, soybean, and sunflower oils. Avocado. Seasoning and other foods Herbs. Spices. Seasoning mixes without salt. Unsalted popcorn and pretzels. Fat-free sweets. What foods are not recommended? The items listed may not be a complete list. Talk with your dietitian about what dietary choices are best for you. Grains Baked goods made with fat, such as croissants, muffins, or some breads. Dry pasta or rice meal packs. Vegetables Creamed or fried vegetables. Vegetables in a cheese sauce. Regular canned vegetables (not low-sodium or reduced-sodium). Regular canned tomato sauce and paste (not low-sodium or reduced-sodium). Regular tomato and vegetable juice (not low-sodium or reduced-sodium). Angie Fava.  Olives. Fruits Canned fruit in a light or heavy syrup. Fried fruit. Fruit in cream or butter sauce. Meat and other protein foods Fatty cuts of meat. Ribs. Fried meat. Berniece Salines. Sausage. Bologna and other processed lunch meats. Salami. Fatback. Hotdogs. Bratwurst. Salted nuts and seeds. Canned beans with added salt. Canned or smoked fish. Whole eggs or egg yolks. Chicken or [place] with skin. Dairy Whole or 2% milk, cream, and half-and-half. Whole or full-fat cream cheese. Whole-fat or sweetened yogurt. Full-fat cheese. Nondairy creamers. Whipped toppings. Processed cheese and cheese spreads. Fats and oils Butter. Stick margarine. Lard. Shortening. Ghee. Bacon fat. Tropical oils, such as coconut, palm kernel, or palm oil. Seasoning and other foods Salted popcorn and pretzels. Onion salt, garlic salt, seasoned salt, table salt, and sea salt. Worcestershire sauce. Tartar sauce. Barbecue sauce. Teriyaki sauce. Soy sauce, including reduced-sodium. Steak sauce. Canned and packaged gravies. Fish sauce. Oyster sauce. Cocktail sauce. Horseradish that you find on the shelf. Ketchup. Mustard. Meat flavorings and tenderizers. Bouillon cubes. Hot sauce and Tabasco sauce. Premade or packaged marinades. Premade or packaged taco seasonings. Relishes. Regular salad dressings. Where to find more information:  National Heart, Lung, and [place]: https://wilson-eaton.com/  American Heart Association: www.heart.org Summary  The DASH eating plan is a  healthy eating plan that has been shown to reduce high blood pressure (hypertension). It may also reduce your risk for type 2 diabetes, heart disease, and stroke.  With the DASH eating plan, you should limit salt (sodium) intake to 2,300 mg a day. If you have hypertension, you may need to reduce your sodium intake to 1,500 mg a day.  When on the DASH eating plan, aim to eat more fresh fruits and vegetables, whole grains, lean proteins, low-fat dairy, and heart-healthy  fats.  Work with your health care provider or diet and nutrition specialist (dietitian) to adjust your eating plan to your individual calorie needs. This information is not intended to replace advice given to you by your health care provider. Make sure you discuss any questions you have with your health care provider. Document Revised: 02/03/2017 Document Reviewed: 02/15/2016 Elsevier Patient Education  2020 [employer].

## 2019-08-27 NOTE — Progress Notes (Signed)
This visit was conducted in person.  BP (!) 156/70 (BP Location: Left Arm, Patient Position: Sitting, Cuff Size: Normal)   Pulse (!) 59   Temp 97.6 F (36.4 C) (Temporal)   Ht 5' 7.5" (1.715 m)   Wt 174 lb 9 oz (79.2 kg)   SpO2 96%   BMI 26.94 kg/m   BP Readings from Last 3 Encounters:  08/27/19 (!) 156/70  01/15/19 140/70  06/05/18 140/66  BP elevated on retesting 168/70  CC: 6 mo f/u visit  Subjective:    Patient ID: Jeffrey [name], male    DOB: 04-18-1940, 79 y.o.   MRN: [id number]  HPI: Jeffrey [name] is a 79 y.o. male presenting on 08/27/2019 for Follow-up (Here for 6 mo f/u.  Pt accompanied by daughter, [name]- temp 97.9.)   H/o CVA with residual R hemiparesis ~2016.   Notes some ongoing urinary urgency and frequency with occasional urge incontinence worse since CVA. No stress incontinence symptoms. No dysuria, hematuria.   Increased family stressors - wife had a fall with hip fracture and is now bed bound. They are looking into placement for her.   Depression - did not do well with trial off sertraline, back on 25mg  daily.   HTN - Compliant with current antihypertensive regimen of fosinopril 40mg  daily, metoprolol 25mg  bid, losartan 50mg  daily, and lasix 20mg  daily. Does not check blood pressures at home. No low blood pressure readings or symptoms of dizziness/syncope.  Denies HA, vision changes, CP/tightness, SOB, leg swelling.   DM - followed by endo Dr Buddy Duty last seen 05/06/2019. Does regularly check sugars fasting once daily - 177 this morning. Compliant with antihyperglycemic regimen which includes: novolog 10u with breakfast and supper, lantus 26u nightly. Looking to start trulicity, pending approval by [facility]. Denies low sugars or hypoglycemic symptoms. Denies paresthesias. Last diabetic eye exam 11/2018. Pneumovax: 2014. Prevnar: 2015. Glucometer brand: unsure. DSME: has completed previously.  Lab Results  Component Value Date   HGBA1C 8.1 (A) 08/27/2019    Diabetic Foot Exam - Simple   No data filed     Lab Results  Component Value Date   MICROALBUR <0.7 10/31/2017         Relevant past medical, surgical, family and social history reviewed and updated as indicated. Interim medical history since our last visit reviewed. Allergies and medications reviewed and updated. Outpatient Medications Prior to Visit  Medication Sig Dispense Refill  . atorvastatin (LIPITOR) 80 MG tablet Take 80 mg by mouth at bedtime.    . baclofen (LIORESAL) 10 MG tablet Take 0.5 tablets (5 mg total) by mouth 3 (three) times daily. 60 each 3  . Cholecalciferol (VITAMIN D3) 5000 UNITS TABS Take 5,000 Units by mouth once a week.    . clopidogrel (PLAVIX) 75 MG tablet Take 1 tablet (75 mg total) by mouth daily. 30 tablet 6  . fosinopril ([name]) 40 MG tablet Take 40 mg by mouth daily.    . insulin aspart (NOVOLOG FLEXPEN) 100 UNIT/ML FlexPen Inject 10 Units into the skin as directed. 10 units with breakfast 10 units with supper    . insulin glargine (LANTUS) 100 UNIT/ML injection Inject 0.24 mLs (24 Units total) into the skin at bedtime.    [name] LACTOBACILLUS PO Take 1 capsule by mouth daily. Philips colon health.  Takes M/W/F    . loratadine (CLARITIN) 10 MG tablet Take 10 mg by mouth daily as needed.     [name] losartan (COZAAR) 50 MG tablet Take 50  mg by mouth daily.    . metoprolol tartrate (LOPRESSOR) 25 MG tablet Take 1 tablet (25 mg total) by mouth 2 (two) times daily.    . sertraline (ZOLOFT) 25 MG tablet Take 1 tablet (25 mg total) by mouth daily. 30 tablet 11  . furosemide (LASIX) 40 MG tablet TAKE 1 TABLET BY MOUTH EVERY DAY 90 tablet 1  . furosemide (LASIX) 40 MG tablet Take 0.5 tablets (20 mg total) by mouth daily.    . insulin glargine (LANTUS) 100 UNIT/ML injection Inject 26 Units into the skin at bedtime.  10 mL    No facility-administered medications prior to visit.     Per HPI unless specifically indicated in ROS section below Review of  Systems Objective:  BP (!) 156/70 (BP Location: Left Arm, Patient Position: Sitting, Cuff Size: Normal)   Pulse (!) 59   Temp 97.6 F (36.4 C) (Temporal)   Ht 5' 7.5" (1.715 m)   Wt 174 lb 9 oz (79.2 kg)   SpO2 96%   BMI 26.94 kg/m   Wt Readings from Last 3 Encounters:  08/27/19 174 lb 9 oz (79.2 kg)  01/15/19 174 lb 1 oz (79 kg)  07/27/18 170 lb (77.1 kg)      Physical Exam Vitals and nursing note reviewed.  Constitutional:      Appearance: Normal appearance. He is not ill-appearing.     Comments: Sitting in transport chair  Eyes:     Extraocular Movements: Extraocular movements intact.     Pupils: Pupils are equal, round, and reactive to light.  Cardiovascular:     Rate and Rhythm: Normal rate and regular rhythm.     Pulses: Normal pulses.     Heart sounds: Normal heart sounds. No murmur heard.   Pulmonary:     Effort: Pulmonary effort is normal. No respiratory distress.     Breath sounds: Normal breath sounds. No wheezing, rhonchi or rales.  Musculoskeletal:     Right lower leg: Edema (1+) present.     Left lower leg: Edema (tr) present.  Skin:    General: Skin is warm and dry.     Findings: No rash.  Neurological:     Mental Status: He is alert.     Comments: Chronic R sided hemiparesis       Results for orders placed or performed in visit on 08/27/19  POCT glycosylated hemoglobin (Hb A1C)  Result Value Ref Range   Hemoglobin A1C 8.1 (A) 4.0 - 5.6 %   HbA1c POC (<> result, manual entry)     HbA1c, POC (prediabetic range)     HbA1c, POC (controlled diabetic range)    POCT Urinalysis Dipstick (Automated)  Result Value Ref Range   Color, UA light yellow    Clarity, UA clear    Glucose, UA Negative Negative   Bilirubin, UA negative    Ketones, UA negative    Spec Grav, UA 1.015 1.010 - 1.025   Blood, UA negative    pH, UA 6.0 5.0 - 8.0   Protein, UA Negative Negative   Urobilinogen, UA 0.2 0.2 or 1.0 E.U./dL   Nitrite, UA negative    Leukocytes, UA  Negative Negative   Lab Results  Component Value Date   PSA 2.98 07/16/2015   PSA 1.90 06/07/2014   PSA 0.95 04/17/2009   Assessment & Plan:  This visit occurred during the SARS-CoV-2 public health emergency.  Safety protocols were in place, including screening questions prior to the visit, additional usage  of staff PPE, and extensive cleaning of exam room while observing appropriate contact time as indicated for disinfecting solutions.   Problem List Items Addressed This Visit    Urinary urgency    Notes ongoing trouble since CVA, may be progressively worsening. No recent DRE but last PSA was trending up (3.0) - this could reasonably be BPH related. Therefore reasonable to try saw palmetto supplement and monitor effect. We also discussed lasix use and decreasing caffeinated beverages. Check urinalysis today.       Relevant Orders   POCT Urinalysis Dipstick (Automated) (Completed)   Type 2 diabetes, controlled, with neuropathy ([place]) - Primary    Appreciate endo care. Latest A1c improved to 8.1%.       Relevant Medications   insulin glargine (LANTUS) 100 UNIT/ML injection   Other Relevant Orders   POCT glycosylated hemoglobin (Hb A1C) (Completed)   Hypertension associated with diabetes (HCC)    Chronic, deteriorated. Patient attributes to increased recent stressors (as per HPI). No changes made but I asked daughter to monitor BP daily at home this week and call us Friday with readings - that will determine need to change antihypertensive regimen.  I did also recommend low salt diet <1.5gm/day, DASH diet handout provided today as well.       Relevant Medications   insulin glargine (LANTUS) 100 UNIT/ML injection   furosemide (LASIX) 20 MG tablet   Hemiparesis affecting right side as late effect of cerebrovascular accident ([place])    Chronic, stable. Takes baclofen.      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    Did not do well coming off sertraline - will continue 25mg  daily.       Chronic  diastolic heart failure, NYHA class 1 (HCC)    Mild pedal edema on lasix 20mg  daily - rec continue this.       Relevant Medications   furosemide (LASIX) 20 MG tablet       Meds ordered this encounter  Medications  . furosemide (LASIX) 20 MG tablet    Sig: Take 0.5-1 tablets (10-20 mg total) by mouth daily.    Dispense:  90 tablet    Refill:  1  . saw palmetto 80 MG capsule    Sig: Take 1 capsule (80 mg total) by mouth daily.   Orders Placed This Encounter  Procedures  . POCT glycosylated hemoglobin (Hb A1C)  . POCT Urinalysis Dipstick (Automated)     Patient instructions: Urinalysis today.  Ok to try lasix 10mg  daily if needed but if increasing blood pressure or leg swelling or shortness of breath, return to 20mg  daily. Let me know how you do with this.  Blood pressures are staying high: Work on low salt/sodium diet - goal <1.5gm (1,500mg ) per day. Eat a diet high in fruits/vegetables and whole grains.  Look into mediterranean and DASH diet.  Ok to try saw palmetto for urinary symptoms.  Less caffeine, more water can also help.  Good to see you today  Return as needed or in 6 months for next physical.   Follow up plan: Return in about 6 months (around 02/26/2020) for annual exam, prior fasting for blood work, medicare wellness visit.  [name], MD

## 2019-08-27 NOTE — Assessment & Plan Note (Signed)
Notes ongoing trouble since CVA, may be progressively worsening. No recent DRE but last PSA was trending up (3.0) - this could reasonably be BPH related. Therefore reasonable to try saw palmetto supplement and monitor effect. We also discussed lasix use and decreasing caffeinated beverages. Check urinalysis today.

## 2019-08-30 ENCOUNTER — Telehealth: Payer: Self-pay

## 2019-08-30 DIAGNOSIS — R600 Localized edema: Secondary | ICD-10-CM

## 2019-08-30 NOTE — Telephone Encounter (Signed)
Patient's daughter called and states that she wanted to give Dr. Darnell Level an update on patient's BP readings: Wed 158/84 Thurs 156/74 Fri 139/77

## 2019-09-02 NOTE — Telephone Encounter (Signed)
Thanks for the update. BP staying a bit high.  Please verify - is he taking both fosinopril AND losartan? I believe he should only be on fosinopril (along with metoprolol and lasix) as the losartan and fosinopril are similar type meds.

## 2019-09-02 NOTE — Telephone Encounter (Signed)
Lvm for pt/pt's daughter, [name] (on dpr), to call back.  Need to relay Dr. Synthia Innocent message.

## 2019-09-03 NOTE — Telephone Encounter (Signed)
Spoke with pt's daughter, [name], asking about meds.  She states pt is taking both fosinopril and losartan.  I relayed Dr. Synthia Innocent message.  Says she will d/c losartan.  [name] is asking if pt is to continue 20 mg of Lasix.  (Gives permission to lvm.)

## 2019-09-05 NOTE — Telephone Encounter (Signed)
Yes continue 20mg  lasix.  Anticipate may need extra BP med to maintain good BP control - let us know if that is the case and we could start amlodipine BP med in addition to his fosinopril, metoprolol, lasix.

## 2019-09-05 NOTE — Telephone Encounter (Signed)
Spoke with [facility] relaying Dr. Synthia Innocent message.  She verbalizes understanding.

## 2019-09-06 DIAGNOSIS — E1142 Type 2 diabetes mellitus with diabetic polyneuropathy: Secondary | ICD-10-CM | POA: Diagnosis not present

## 2019-09-06 DIAGNOSIS — Z794 Long term (current) use of insulin: Secondary | ICD-10-CM | POA: Diagnosis not present

## 2019-09-06 DIAGNOSIS — E11319 Type 2 diabetes mellitus with unspecified diabetic retinopathy without macular edema: Secondary | ICD-10-CM | POA: Diagnosis not present

## 2019-09-06 DIAGNOSIS — Z8673 Personal history of transient ischemic attack (TIA), and cerebral infarction without residual deficits: Secondary | ICD-10-CM | POA: Diagnosis not present

## 2019-09-06 DIAGNOSIS — I10 Essential (primary) hypertension: Secondary | ICD-10-CM | POA: Diagnosis not present

## 2019-09-06 DIAGNOSIS — E785 Hyperlipidemia, unspecified: Secondary | ICD-10-CM | POA: Diagnosis not present

## 2019-09-06 DIAGNOSIS — E1165 Type 2 diabetes mellitus with hyperglycemia: Secondary | ICD-10-CM | POA: Diagnosis not present

## 2019-09-07 NOTE — Progress Notes (Signed)
I have collaborated with the care management provider regarding care management and care coordination activities outlined in this encounter and have reviewed this encounter including documentation in the note and care plan. I am certifying that I agree with the content of this note and encounter as supervising physician.  

## 2019-10-01 NOTE — Addendum Note (Signed)
Addended by: [name] on: 10/01/2019 02:26 PM   Modules accepted: Orders

## 2019-10-01 NOTE — Telephone Encounter (Signed)
[name] (DPR signed) said pt was seen on 08/27/19; due to pt having swelling in his legs [name] did not decrease the lasix to 10 mg daily so has continued taking lasix 20 mg daily in AM and Lisinopril 40 mg around lunch time. [name] said the swelling is not much worse than when Dr Darnell Level saw pt on 08/27/19. No redness, warmth or pain in either leg. [name] said overnight the swelling completely goes down and pt keeps his feet up in recliner when sitting but as the day goes on the lower legs, ankles and feet swell worse. No CP,SOB,H/A or dizziness. [name] has not taken pts BP recently. Now BP 141/67 P 65. [name] does not want to schedule appt since seen on 08/27/19. Christie request cb after Dr Darnell Level reviews this note.[facility].

## 2019-10-01 NOTE — Telephone Encounter (Addendum)
Christie notified as instructed and voiced understanding. Pt does not have CP or Dyspnea. Pt does have good urine output. Pt already has appts for tomorrow and [name] scheduled lab appt for TSH, CMP and CBC with diff on 10/03/19 at 12:30 pm and pt will eat breakfast and then fast 4 hrs prior to labs. [name] will cb if needed and UC & ED precautions given also and [name] voiced understanding.

## 2019-10-01 NOTE — Telephone Encounter (Addendum)
I recommend they come in for labwork to further evaluate ongoing leg swelling. Also want to ensure tolerating current meds well, prior to considering increasing lasix dosing. Labs ordered.  Does he have good urine output when he takes a lasix 20mg  tab?  Any chest pain or dyspnea?

## 2019-10-02 ENCOUNTER — Encounter: Payer: Self-pay | Admitting: Cardiology

## 2019-10-02 ENCOUNTER — Ambulatory Visit (INDEPENDENT_AMBULATORY_CARE_PROVIDER_SITE_OTHER): Payer: PPO | Admitting: Cardiology

## 2019-10-02 ENCOUNTER — Other Ambulatory Visit: Payer: Self-pay

## 2019-10-02 VITALS — BP 130/60 | HR 59 | Ht 67.5 in | Wt 175.8 lb

## 2019-10-02 DIAGNOSIS — I69351 Hemiplegia and hemiparesis following cerebral infarction affecting right dominant side: Secondary | ICD-10-CM

## 2019-10-02 DIAGNOSIS — I10 Essential (primary) hypertension: Secondary | ICD-10-CM

## 2019-10-02 DIAGNOSIS — I2510 Atherosclerotic heart disease of native coronary artery without angina pectoris: Secondary | ICD-10-CM

## 2019-10-02 DIAGNOSIS — E119 Type 2 diabetes mellitus without complications: Secondary | ICD-10-CM

## 2019-10-02 NOTE — Progress Notes (Signed)
Cardiology Office Note:    Date:  10/02/2019   ID:  Jeffrey [name], DOB 1940-06-14, MRN [id number]  PCP:  [name], MD  [facility] Cardiologist:  Candee Furbish, MD  [facility] Electrophysiologist:  None   Referring MD: [name], MD     History of Present Illness:    Jeffrey [name] is a 79 y.o. male with coronary artery disease here for follow-up.  Prior patient of Dr. Ron Parker.  Previously there was notation that he had had PCI but he thinks he may have had balloon angioplasty or PTCA only.  Normal ejection fraction.  Unfortunately in March 2016 he had a stroke with hemorrhagic transformation with right sided hemiparesis.  No atrial fibrillation.  Previously had been on Plavix.  Had physical therapy for ataxia Jeffrey [name] is where wife currently resides skilled nursing facility.  She has dementia, broken hip.  Overall he has been doing quite well without any fevers chills nausea vomiting syncope bleeding.  Uses wheelchair.  Positive attitude.  Retired [employer].  Past Medical History:  Diagnosis Date  . Anterior cerebral circulation hemorrhagic infarction [facility]) 11/24/2014   March, 2016, dominant left thalamic and left internal capsule ischemic infarct with resultant hemorrhagic transformation, secondary to small vessel disease. Resulted in right hemiparesis dysarthria and diplopia   //   readmission with aphasia July, 2016, this improved   . Arthritis   . CAD (coronary artery disease)    PCI distal RCA ...2004, residual 70% LAD   /   ...nuclear...03/2007...no ischemia.Marland KitchenMarland Kitchenpreserved LV /  nuclear...03/03/2009...inferior scar..no ischemia..EF 51%  . Cyst of nasopharynx    per ENT Wilburn Cornelia  . Dyslipidemia    takes Atorvastatin daily  . GERD (gastroesophageal reflux disease)    takes Omeprazole daily  . Gout    takes Allopurinol daily and Colchicine as needed  . HCAP (healthcare-associated pneumonia)   . Hemiparesis affecting right side as late effect of  cerebrovascular accident ([place]) 09/05/2014  . History of colon polyps   . HTN (hypertension)    takes Cardura,Metoprolol,[name],and Amlodipine daily  . Internal hemorrhoids   . Myocardial infarction [facility]) 4yrs ago  . Peripheral edema    takes Lasix daily  . 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 Right carotid bruit   . Seasonal allergic rhinitis   . Stroke ([place])   . Type 2 diabetes, uncontrolled, with neuropathy ([place]) 1989   takes Invokana daily and has an insulin pump (Dr. [name])    Past Surgical History:  Procedure Laterality Date  . BALLOON ANGIOPLASTY, ARTERY  1992, 2004   CAD, Dr. Ron Parker  . CARDIAC CATHETERIZATION  2004  . CATARACT EXTRACTION Bilateral 2010  . COLONOSCOPY  04/03/2002   adenomatous polyp, int hemorrhoids  . COLONOSCOPY  07/18/2007   normal (Dr. Fuller Plan)  . COLONOSCOPY  09/26/2012   tubular adenoma, sm int hem, rpt 5 yrs Fuller Plan)  . ELBOW SURGERY Right 1997  . eye lids raised    . NASAL SEPTUM SURGERY    . POLYPECTOMY N/A 05/27/2013   Procedure: ENDOSCOPIC NASOPHARYNGEAL MASS;  Surgeon: [name], MD  . [place]  09/2011   left, with subacromial decompression  . TONSILLECTOMY AND ADENOIDECTOMY      Current Medications: Current Meds  Medication Sig  . atorvastatin (LIPITOR) 80 MG tablet Take 80 mg by mouth at bedtime.  . baclofen (LIORESAL) 10 MG tablet Take 0.5 tablets (5 mg total) by mouth 3 (three)  times daily.  . Cholecalciferol (VITAMIN D3) 5000 UNITS TABS Take 5,000 Units by mouth once a week.  . clopidogrel (PLAVIX) 75 MG tablet Take 1 tablet (75 mg total) by mouth daily.  . fosinopril ([name]) 40 MG tablet Take 40 mg by mouth daily.  . furosemide (LASIX) 20 MG tablet Take 0.5-1 tablets (10-20 mg total) by mouth daily.  . insulin aspart (NOVOLOG FLEXPEN) 100 UNIT/ML FlexPen Inject 10 Units into the skin as directed. 10 units with breakfast 10 units with supper  . insulin glargine  (LANTUS) 100 UNIT/ML injection Inject 0.24 mLs (24 Units total) into the skin at bedtime.  [name] LACTOBACILLUS PO Take 1 capsule by mouth daily. Philips colon health.  Takes M/W/F  . loratadine (CLARITIN) 10 MG tablet Take 10 mg by mouth daily as needed.   . metoprolol tartrate (LOPRESSOR) 25 MG tablet Take 1 tablet (25 mg total) by mouth 2 (two) times daily.  . saw palmetto 80 MG capsule Take 1 capsule (80 mg total) by mouth daily.  . sertraline (ZOLOFT) 25 MG tablet Take 1 tablet (25 mg total) by mouth daily.     Allergies:   Niacin   Social History   Socioeconomic History  . Marital status: Married    Spouse name: Not on file  . Number of children: 2  . Years of education: Bachelors  . Highest education level: Not on file  Occupational History  . Occupation: Music therapist: [employer]  Tobacco Use  . Smoking status: Former Research scientist (life sciences)  . Smokeless tobacco: Never Used  . Tobacco comment: quit smoking 45 years ago.  Vaping Use  . Vaping Use: Never used  Substance and Sexual Activity  . Alcohol use: No  . Drug use: No  . Sexual activity: Yes  Other Topics Concern  . Not on file  Social History Narrative   Caffeine: 6-7 cups coffee   Lives with wife, no pets   2 grown children, 4 grandchildren   Occu: Artist   Edu: [employer]   Activity: not much   Diet: plenty of water, fruits and vegetables   Some care through [place]      Endo: Dr. Buddy Duty   Social Determinants of Health   Financial Resource Strain: Low Risk   . Difficulty of Paying Living Expenses: Not very hard  Food Insecurity:   . Worried About Charity fundraiser in the Last Year:   . Arboriculturist in the Last Year:   Transportation Needs:   . Film/video editor (Medical):   [name] Lack of Transportation (Non-Medical):   Physical Activity:   . Days of Exercise per Week:   . Minutes of Exercise per Session:   Stress:   . Feeling of Stress :   Social Connections:   .  Frequency of Communication with Friends and Family:   . Frequency of Social Gatherings with Friends and Family:   . Attends Religious Services:   . Active Member of Clubs or Organizations:   . Attends Archivist Meetings:   [name] Marital Status:      Family History: The patient's family history includes Alcohol abuse in his father; Heart attack in his father and son. There is no history of Coronary artery disease, Stroke, Cancer, Diabetes, or Colon cancer.  ROS:   Please see the history of present illness.     All other systems reviewed and are negative.  EKGs/Labs/Other Studies Reviewed:  The following studies were reviewed today: ECHO: 06/09/14: - Left ventricle: The cavity size was normal. Wall thickness was normal. Systolic function was normal. The estimated ejection fraction was in the range of 55% to 60%. Regional wall motion abnormalities cannot be excluded. Doppler parameters are consistent with abnormal left ventricular relaxation (grade 1 diastolic dysfunction).  EKG:  EKG is  ordered today.  The ekg ordered today demonstrates Sinus bradycardia rate 59 no other significant abnormalities  Recent Labs: 01/15/2019: ALT 26; BUN 15; Creatinine, Ser 1.00; Potassium 3.9; Sodium 142; TSH 0.87  Recent Lipid Panel    Component Value Date/Time   CHOL 152 01/15/2019 1501   CHOL 211 02/01/2011 0000   TRIG 184.0 (H) 01/15/2019 1501   TRIG 342 09/30/2013 0000   TRIG 342 09/30/2013 0000   HDL 34.50 (L) 01/15/2019 1501   CHOLHDL 4 01/15/2019 1501   VLDL 36.8 01/15/2019 1501   LDLCALC 80 01/15/2019 1501   LDLCALC 98 09/30/2013 0000   LDLCALC 98 09/30/2013 0000   LDLDIRECT 67.0 10/31/2017 1250    Physical Exam:    VS:  BP (!) 130/60   Pulse 59   Ht 5' 7.5" (1.715 m)   Wt 175 lb 12.8 oz (79.7 kg)   SpO2 97%   BMI 27.13 kg/m     Wt Readings from Last 3 Encounters:  10/02/19 175 lb 12.8 oz (79.7 kg)  08/27/19 174 lb 9 oz (79.2 kg)  01/15/19 174 lb 1 oz  (79 kg)     GEN:  Well nourished, well developed in no acute distress in wheelchair HEENT: Normal NECK: No JVD; No carotid bruits LYMPHATICS: No lymphadenopathy CARDIAC: RRR, no murmurs, rubs, gallops RESPIRATORY:  Clear to auscultation without rales, wheezing or rhonchi  ABDOMEN: Soft, non-tender, non-distended MUSCULOSKELETAL:  No edema; No deformity  SKIN: Warm and dry NEUROLOGIC:  Alert and oriented x 3, right-sided weakness noted PSYCHIATRIC:  Normal affect   ASSESSMENT:    1. Coronary artery disease involving native coronary artery of native heart without angina pectoris   2. Essential hypertension   3. Hemiparesis affecting right side as late effect of cerebrovascular accident ([place])   4. Diabetes mellitus with coincident hypertension ([place])    PLAN:    In order of problems listed above:  Coronary artery disease status post PCI/PTCA distal RCA in 2004 - 2010 nuclear stress test showed no ischemia, demonstrated prior inferior scar. -EF normal April 2016 echo. -Doing well, good overall blood pressure control, statin. -No evidence of atrial fibrillation.  Prior neurology notes reviewed.  Prior stroke 2016 with hemorrhagic transformation with right-sided hemiparesis - Now on Plavix, no bleeding.  Prior notes were reviewed from Dr. Leonie Man.  Secondary prevention.  Diabetes with hypertension and polyneuropathy - Continuing to monitor.  Medications reviewed. - Dr. Buddy Duty has been following.  Triglycerides have been as high as 500 in the past.  Recently have been 184.  Continue with diet and exercise as well.   Hyperlipidemia - Prior LDL 65, at last check 80 on high intensity atorvastatin.  No myalgias.  Doing well no changes in medication     Medication Adjustments/Labs and Tests Ordered: Current medicines are reviewed at length with the patient today.  Concerns regarding medicines are outlined above.  Orders Placed This Encounter  Procedures  . EKG 12-Lead   No  orders of the defined types were placed in this encounter.   Patient Instructions  Medication Instructions:  The current medical regimen is effective;  continue present plan  and medications.  *If you need a refill on your cardiac medications before your next appointment, please call your pharmacy*  Follow-Up: At [facility], you and your health needs are our priority.  As part of our continuing mission to provide you with exceptional heart care, we have created designated Provider Care Teams.  These Care Teams include your primary Cardiologist (physician) and Advanced Practice Providers (APPs -  Physician Assistants and Nurse Practitioners) who all work together to provide you with the care you need, when you need it.  Your next appointment:   12 month(s)  The format for your next appointment:   In Person  Provider:   Candee Furbish, MD   Thank you for choosing [facility]!!        Signed, Candee Furbish, MD  10/02/2019 11:05 AM    [place]

## 2019-10-02 NOTE — Patient Instructions (Signed)
Medication Instructions:  The current medical regimen is effective;  continue present plan and medications.  *If you need a refill on your cardiac medications before your next appointment, please call your pharmacy*  Follow-Up: At [facility], you and your health needs are our priority.  As part of our continuing mission to provide you with exceptional heart care, we have created designated Provider Care Teams.  These Care Teams include your primary Cardiologist (physician) and Advanced Practice Providers (APPs -  Physician Assistants and Nurse Practitioners) who all work together to provide you with the care you need, when you need it.  Your next appointment:   12 month(s)  The format for your next appointment:   In Person  Provider:   Mark Skains, MD   Thank you for choosing [facility]!!     

## 2019-10-03 ENCOUNTER — Other Ambulatory Visit (INDEPENDENT_AMBULATORY_CARE_PROVIDER_SITE_OTHER): Payer: PPO

## 2019-10-03 ENCOUNTER — Other Ambulatory Visit: Payer: Self-pay

## 2019-10-03 DIAGNOSIS — R600 Localized edema: Secondary | ICD-10-CM

## 2019-10-03 LAB — CBC WITH DIFFERENTIAL/PLATELET
Basophils Absolute: 0.1 10*3/uL (ref 0.0–0.1)
Basophils Relative: 0.8 % (ref 0.0–3.0)
Eosinophils Absolute: 0.1 10*3/uL (ref 0.0–0.7)
Eosinophils Relative: 2 % (ref 0.0–5.0)
HCT: 39.3 % (ref 39.0–52.0)
Hemoglobin: 13.6 g/dL (ref 13.0–17.0)
Lymphocytes Relative: 25.2 % (ref 12.0–46.0)
Lymphs Abs: 1.6 10*3/uL (ref 0.7–4.0)
MCHC: 34.7 g/dL (ref 30.0–36.0)
MCV: 87.8 fl (ref 78.0–100.0)
Monocytes Absolute: 0.5 10*3/uL (ref 0.1–1.0)
Monocytes Relative: 7.2 % (ref 3.0–12.0)
Neutro Abs: 4.2 10*3/uL (ref 1.4–7.7)
Neutrophils Relative %: 64.8 % (ref 43.0–77.0)
Platelets: 150 10*3/uL (ref 150.0–400.0)
RBC: 4.48 Mil/uL (ref 4.22–5.81)
RDW: 13.2 % (ref 11.5–15.5)
WBC: 6.5 10*3/uL (ref 4.0–10.5)

## 2019-10-03 LAB — COMPREHENSIVE METABOLIC PANEL
ALT: 25 U/L (ref 0–53)
AST: 15 U/L (ref 0–37)
Albumin: 3.7 g/dL (ref 3.5–5.2)
Alkaline Phosphatase: 93 U/L (ref 39–117)
BUN: 18 mg/dL (ref 6–23)
CO2: 32 mEq/L (ref 19–32)
Calcium: 9.3 mg/dL (ref 8.4–10.5)
Chloride: 104 mEq/L (ref 96–112)
Creatinine, Ser: 0.84 mg/dL (ref 0.40–1.50)
GFR: 88.67 mL/min (ref >60.00)
Glucose, Bld: 151 mg/dL — ABNORMAL HIGH (ref 70–99)
Potassium: 3.9 mEq/L (ref 3.5–5.1)
Sodium: 138 mEq/L (ref 135–145)
Total Bilirubin: 0.8 mg/dL (ref 0.2–1.2)
Total Protein: 6 g/dL (ref 6.0–8.3)

## 2019-10-03 LAB — TSH: TSH: 1 u[IU]/mL (ref 0.35–4.50)

## 2019-10-14 ENCOUNTER — Telehealth: Payer: Self-pay | Admitting: *Deleted

## 2019-10-14 ENCOUNTER — Telehealth: Payer: Self-pay

## 2019-10-14 NOTE — Telephone Encounter (Signed)
plz notify - labs were actually really good with thyroid, kidneys, liver, blood counts all normal range. How are blood pressures?  If ongoing leg swelling, recommend increasing lasix to 20mg  twice daily for 3 days and then update [place] with effect.

## 2019-10-14 NOTE — Progress Notes (Signed)
Chronic Care Management Pharmacy Assistant   Name: FENIX RORKE  MRN: [id number] DOB: 05/20/1940  Reason for Encounter: Medication Review  Patient Questions:  1.  Have you seen any other providers since your last visit? No  2.  Any changes in your medicines or health? No     PCP : [name], MD  Allergies:   Allergies  Allergen Reactions  . Niacin Other (See Comments)    REACTION: intolerant,not allergic="flushing,hot flashes,turning red" REACTION: intolerant,not allergic="flushing,hot flashes,turning red"    Medications: Outpatient Encounter Medications as of 10/14/2019  Medication Sig  . atorvastatin (LIPITOR) 80 MG tablet Take 80 mg by mouth at bedtime.  . baclofen (LIORESAL) 10 MG tablet Take 0.5 tablets (5 mg total) by mouth 3 (three) times daily.  . Cholecalciferol (VITAMIN D3) 5000 UNITS TABS Take 5,000 Units by mouth once a week.  . clopidogrel (PLAVIX) 75 MG tablet Take 1 tablet (75 mg total) by mouth daily.  . fosinopril ([name]) 40 MG tablet Take 40 mg by mouth daily.  . furosemide (LASIX) 20 MG tablet Take 0.5-1 tablets (10-20 mg total) by mouth daily.  . insulin aspart (NOVOLOG FLEXPEN) 100 UNIT/ML FlexPen Inject 10 Units into the skin as directed. 10 units with breakfast 10 units with supper  . insulin glargine (LANTUS) 100 UNIT/ML injection Inject 0.24 mLs (24 Units total) into the skin at bedtime.  [name] LACTOBACILLUS PO Take 1 capsule by mouth daily. Philips colon health.  Takes M/W/F  . loratadine (CLARITIN) 10 MG tablet Take 10 mg by mouth daily as needed.   . metoprolol tartrate (LOPRESSOR) 25 MG tablet Take 1 tablet (25 mg total) by mouth 2 (two) times daily.  . saw palmetto 80 MG capsule Take 1 capsule (80 mg total) by mouth daily.  . sertraline (ZOLOFT) 25 MG tablet Take 1 tablet (25 mg total) by mouth daily.   No facility-administered encounter medications on file as of 10/14/2019.    Current Diagnosis: Patient Active Problem List    Diagnosis Date Noted  . Urinary urgency 08/27/2019  . DNR (do not resuscitate) 04/11/2019  . Health maintenance examination 07/27/2016  . Hypertropia of right eye 03/22/2016  . Medicare annual wellness visit, subsequent 07/23/2015  . Advanced care planning/counseling discussion 07/23/2015  . Diplopia 07/15/2015  . Anterior cerebral circulation hemorrhagic infarction ([place]) 11/24/2014  . Hemiparesis affecting right side as late effect of cerebrovascular accident ([place]) 09/05/2014  . 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 Thrombocytopenia ([place]) 06/05/2014  . Chronic diastolic heart failure, NYHA class 1 ([place]) 06/05/2014  . Benign paroxysmal positional vertigo 04/28/2014  . Right carotid bruit   . Seasonal allergic rhinitis   . Trigger thumb of left hand 10/12/2011  . Dysphonia 01/04/2011  . Gout   . Adenomatous polyps   . CAD (coronary artery disease)   . Hypertension associated with diabetes ([place])   . Hyperlipidemia associated with type 2 diabetes mellitus ([place])   . Type 2 diabetes, controlled, with neuropathy ([place])   . Ejection fraction     Goals Addressed   None     Follow-Up:  Coordination of Enhanced Pharmacy Services   Reached out to patients daughter today for medication adherence call. She states there are no questions or concerns today. I also wanted to reschedule his appointment that is scheduled on 11/22/19. Immediate family member was placed in hospice care so we did not reschedule at this time due to limited time. The schedulers will reach out at a  later date to determine the best time for family to talk with Pharmacist.   Martinique Uselman, [place] Pharmacist Assistant  [phone]

## 2019-10-14 NOTE — Telephone Encounter (Signed)
Pt's daughter [name] (last name was [name]) called she said pt was here at the end of July to have f/u labs to see if lasix needs to be adjusted. Daughter said she never received a call back regarding labs and what she should do regarding lasix. Daughter request call back  [phone]

## 2019-10-15 NOTE — Telephone Encounter (Signed)
Spoke with pt's daughter, [name] (on dpr), asking about pt's BP.  States it was 149/72 today, HR 58.  Says that's about pt's average.  I relayed Dr. Synthia Innocent message.  Christie verbalizes understanding.  FYI to Dr. Darnell Level.

## 2019-10-22 NOTE — Telephone Encounter (Signed)
Christy Ragdales Called to up date pt  She checked bp it was good Pt legs look good swelling going down.

## 2019-10-23 NOTE — Telephone Encounter (Signed)
Noted! Thank you

## 2019-10-28 ENCOUNTER — Ambulatory Visit: Payer: PPO | Admitting: Podiatry

## 2019-10-28 ENCOUNTER — Other Ambulatory Visit: Payer: Self-pay

## 2019-10-28 ENCOUNTER — Encounter: Payer: Self-pay | Admitting: Podiatry

## 2019-10-28 DIAGNOSIS — M79674 Pain in right toe(s): Secondary | ICD-10-CM | POA: Diagnosis not present

## 2019-10-28 DIAGNOSIS — E1142 Type 2 diabetes mellitus with diabetic polyneuropathy: Secondary | ICD-10-CM | POA: Diagnosis not present

## 2019-10-28 DIAGNOSIS — M79675 Pain in left toe(s): Secondary | ICD-10-CM | POA: Diagnosis not present

## 2019-10-28 DIAGNOSIS — B351 Tinea unguium: Secondary | ICD-10-CM

## 2019-10-28 NOTE — Patient Instructions (Signed)
EPSOM SALT FOOT SOAK INSTRUCTIONS  *IF YOU HAVE BEEN PRESCRIBED ANTIBIOTICS, TAKE AS INSTRUCTED UNTIL ALL ARE GONE*  Shopping List:  A. Plain epsom salt (not scented) B. Neosporin Cream plus pain relief C. 1-inch fabric band-aids  1.  Place 1/4 cup of epsom salts in 2 quarts of warm tap water. IF YOU ARE DIABETIC, OR HAVE NEUROPATHY, CHECK THE TEMPERATURE OF THE WATER WITH YOUR ELBOW.  2.  Submerge your foot/feet in the solution and soak for 10-15 minutes.      3.  Next, remove your foot/feet from solution, blot dry the affected area.    4.  Apply light amount of antibiotic cream/ointment and cover with fabric band-aid .  5.  This soak should be done once a day for 3 days.   6.  Monitor for any signs/symptoms of infection such as redness, swelling, odor, drainage, increased pain, or non-healing of digit.   7.  Please do not hesitate to call the office and speak to a Nurse or Doctor if you have questions.   8.  If you experience fever, chills, nightsweats, nausea or vomiting with worsening of digit, please go to the emergency room.

## 2019-10-31 NOTE — Progress Notes (Signed)
Subjective: Jeffrey [name] is a pleasant 79 y.o. male patient seen today at risk foot care with history of diabetic neuropathy and painful mycotic nails b/l that are difficult to trim. Pain interferes with ambulation. Aggravating factors include wearing enclosed shoe gear. Pain is relieved with periodic professional debridement.   His daughter is present during today's visit. They voice no new concerns on today's visit.  Patient Active Problem List   Diagnosis Date Noted  . Urinary urgency 08/27/2019  . DNR (do not resuscitate) 04/11/2019  . Health maintenance examination 07/27/2016  . Hypertropia of right eye 03/22/2016  . Medicare annual wellness visit, subsequent 07/23/2015  . Advanced care planning/counseling discussion 07/23/2015  . Diplopia 07/15/2015  . Anterior cerebral circulation hemorrhagic infarction ([place]) 11/24/2014  . Hemiparesis affecting right side as late effect of cerebrovascular accident ([place]) 09/05/2014  . 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 Thrombocytopenia ([place]) 06/05/2014  . Chronic diastolic heart failure, NYHA class 1 ([place]) 06/05/2014  . Benign paroxysmal positional vertigo 04/28/2014  . Right carotid bruit   . Seasonal allergic rhinitis   . Trigger thumb of left hand 10/12/2011  . Dysphonia 01/04/2011  . Gout   . Adenomatous polyps   . CAD (coronary artery disease)   . Hypertension associated with diabetes ([place])   . Hyperlipidemia associated with type 2 diabetes mellitus ([place])   . Type 2 diabetes, controlled, with neuropathy ([place])   . Ejection fraction     Current Outpatient Medications on File Prior to Visit  Medication Sig Dispense Refill  . atorvastatin (LIPITOR) 80 MG tablet Take 80 mg by mouth at bedtime.    . baclofen (LIORESAL) 10 MG tablet Take 0.5 tablets (5 mg total) by mouth 3 (three) times daily. 60 each 3  . Cholecalciferol (VITAMIN D3) 5000 UNITS TABS Take 5,000 Units by mouth once a week.    . clopidogrel (PLAVIX) 75 MG tablet  Take 1 tablet (75 mg total) by mouth daily. 30 tablet 6  . fosinopril ([name]) 40 MG tablet Take 40 mg by mouth daily.    . furosemide (LASIX) 20 MG tablet Take 0.5-1 tablets (10-20 mg total) by mouth daily. 90 tablet 1  . insulin aspart (NOVOLOG FLEXPEN) 100 UNIT/ML FlexPen Inject 10 Units into the skin as directed. 10 units with breakfast 10 units with supper    . insulin glargine (LANTUS) 100 UNIT/ML injection Inject 0.24 mLs (24 Units total) into the skin at bedtime.    [name] LACTOBACILLUS PO Take 1 capsule by mouth daily. Philips colon health.  Takes M/W/F    . loratadine (CLARITIN) 10 MG tablet Take 10 mg by mouth daily as needed.     . metoprolol tartrate (LOPRESSOR) 25 MG tablet Take 1 tablet (25 mg total) by mouth 2 (two) times daily.    . saw palmetto 80 MG capsule Take 1 capsule (80 mg total) by mouth daily.    . sertraline (ZOLOFT) 25 MG tablet Take 1 tablet (25 mg total) by mouth daily. 30 tablet 11   No current facility-administered medications on file prior to visit.    Allergies  Allergen Reactions  . Niacin Other (See Comments)    REACTION: intolerant,not allergic="flushing,hot flashes,turning red" REACTION: intolerant,not allergic="flushing,hot flashes,turning red"    Objective: Physical Exam  General: Jeffrey [name] is a pleasant 79 y.o. Caucasian male, in NAD. AAO x 3.   Vascular:  Capillary fill time to digits <3 seconds b/l lower extremities. Palpable DP pulses b/l. Palpable PT pulses b/l.  Pedal hair absent b/l. Skin temperature gradient within normal limits b/l. Trace edema noted b/l feet.  Dermatological:  Pedal skin with normal turgor, texture and tone bilaterally. No open wounds bilaterally. Toenails 1-5 b/l elongated, discolored, dystrophic, thickened, crumbly with subungual debris and tenderness to dorsal palpation.  Musculoskeletal:  Normal muscle strength 5/5 to all lower extremity muscle groups bilaterally. No pain crepitus or joint limitation noted with  ROM b/l. No gross bony deformities bilaterally.  Neurological:  Protective sensation diminished with 10g monofilament b/l.  Assessment and Plan:  1. Pain due to onychomycosis of toenails of both feet   2. DM type 2 with diabetic peripheral neuropathy ([place])    -Examined patient. -Continue diabetic foot care principles. -Toenails 1-5 b/l were debrided in length and girth with sterile nail nippers and dremel without iatrogenic bleeding.  -Patient to report any pedal injuries to medical professional immediately. -Patient to continue soft, supportive shoe gear daily. -Patient/POA to call should there be question/concern in the interim.  Return in about 9 weeks (around 12/30/2019) for diabetic nail trim.  Marzetta Board, DPM

## 2019-11-06 DIAGNOSIS — M65332 Trigger finger, left middle finger: Secondary | ICD-10-CM | POA: Diagnosis not present

## 2019-11-22 ENCOUNTER — Telehealth: Payer: PPO

## 2019-12-03 ENCOUNTER — Ambulatory Visit: Payer: PPO

## 2019-12-04 DIAGNOSIS — M65332 Trigger finger, left middle finger: Secondary | ICD-10-CM | POA: Diagnosis not present

## 2019-12-09 DIAGNOSIS — E1165 Type 2 diabetes mellitus with hyperglycemia: Secondary | ICD-10-CM | POA: Diagnosis not present

## 2019-12-09 DIAGNOSIS — E1142 Type 2 diabetes mellitus with diabetic polyneuropathy: Secondary | ICD-10-CM | POA: Diagnosis not present

## 2019-12-09 DIAGNOSIS — Z794 Long term (current) use of insulin: Secondary | ICD-10-CM | POA: Diagnosis not present

## 2019-12-09 DIAGNOSIS — I10 Essential (primary) hypertension: Secondary | ICD-10-CM | POA: Diagnosis not present

## 2019-12-09 DIAGNOSIS — E785 Hyperlipidemia, unspecified: Secondary | ICD-10-CM | POA: Diagnosis not present

## 2019-12-09 DIAGNOSIS — E11319 Type 2 diabetes mellitus with unspecified diabetic retinopathy without macular edema: Secondary | ICD-10-CM | POA: Diagnosis not present

## 2019-12-09 DIAGNOSIS — Z8673 Personal history of transient ischemic attack (TIA), and cerebral infarction without residual deficits: Secondary | ICD-10-CM | POA: Diagnosis not present

## 2020-01-10 ENCOUNTER — Ambulatory Visit: Payer: PPO | Admitting: Podiatry

## 2020-01-10 ENCOUNTER — Other Ambulatory Visit: Payer: Self-pay

## 2020-01-10 ENCOUNTER — Encounter: Payer: Self-pay | Admitting: Podiatry

## 2020-01-10 DIAGNOSIS — M79675 Pain in left toe(s): Secondary | ICD-10-CM

## 2020-01-10 DIAGNOSIS — M79674 Pain in right toe(s): Secondary | ICD-10-CM | POA: Diagnosis not present

## 2020-01-10 DIAGNOSIS — E1142 Type 2 diabetes mellitus with diabetic polyneuropathy: Secondary | ICD-10-CM

## 2020-01-10 DIAGNOSIS — B351 Tinea unguium: Secondary | ICD-10-CM | POA: Diagnosis not present

## 2020-01-12 NOTE — Progress Notes (Signed)
Subjective: Jeffrey [name] is a pleasant 79 y.o. male patient seen today at risk foot care with history of diabetic neuropathy and painful mycotic nails b/l that are difficult to trim. Pain interferes with ambulation. Aggravating factors include wearing enclosed shoe gear. Pain is relieved with periodic professional debridement.   His daughter is present during today's visit. They voice no new concerns on today's visit.  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 09/05/2014   Thrombocytopenia ([place]) 06/05/2014   Chronic diastolic heart failure, NYHA class 1 ([employer]) 06/05/2014   Benign paroxysmal positional vertigo 04/28/2014   Right carotid bruit    Seasonal allergic rhinitis    Trigger thumb of left hand 10/12/2011   Dysphonia 01/04/2011   Gout    Adenomatous polyps    CAD (coronary artery disease)    Hypertension associated with diabetes ([place])    Hyperlipidemia associated with type 2 diabetes mellitus ([facility])    Type 2 diabetes, controlled, with neuropathy ([facility])    Ejection fraction     Current Outpatient Medications on File Prior to Visit  Medication Sig Dispense Refill   atorvastatin (LIPITOR) 80 MG tablet Take 80 mg by mouth at bedtime.     baclofen (LIORESAL) 10 MG tablet Take 0.5 tablets (5 mg total) by mouth 3 (three) times daily. 60 each 3   Cholecalciferol (VITAMIN D3) 5000 UNITS TABS Take 5,000 Units by mouth once a  week.     clopidogrel (PLAVIX) 75 MG tablet Take 1 tablet (75 mg total) by mouth daily. 30 tablet 6   fosinopril ([name]) 40 MG tablet Take 40 mg by mouth daily.     furosemide (LASIX) 20 MG tablet Take 0.5-1 tablets (10-20 mg total) by mouth daily. 90 tablet 1   insulin aspart (NOVOLOG FLEXPEN) 100 UNIT/ML FlexPen Inject 10 Units into the skin as directed. 10 units with breakfast 10 units with supper     insulin glargine (LANTUS) 100 UNIT/ML injection Inject 0.24 mLs (24 Units total) into the skin at bedtime.     LACTOBACILLUS PO Take 1 capsule by mouth daily. Philips colon health.  Takes M/W/F     loratadine (CLARITIN) 10 MG tablet Take 10 mg by mouth daily as needed.      metoprolol tartrate (LOPRESSOR) 25 MG tablet Take 1 tablet (25 mg total) by mouth 2 (two) times daily.     saw palmetto 80 MG capsule Take 1 capsule (80 mg total) by mouth daily.     sertraline (ZOLOFT) 25 MG tablet Take 1 tablet (25 mg total) by mouth daily. 30 tablet 11   No current facility-administered medications on file prior to visit.    Allergies  Allergen Reactions   Niacin Other (See Comments)    REACTION: intolerant,not allergic="flushing,hot flashes,turning red" REACTION: intolerant,not allergic="flushing,hot flashes,turning red"    Objective: Physical Exam  General: Jeffrey [name] is a pleasant 79 y.o. Caucasian male, in NAD. AAO x 3.   Vascular:  Capillary fill time to digits <3 seconds b/l lower  extremities. Palpable DP pulses b/l. Palpable PT pulses b/l. Pedal hair absent b/l. Skin temperature gradient within normal limits b/l. Trace edema noted b/l feet.  Dermatological:  Pedal skin with normal turgor, texture and tone bilaterally. No open wounds bilaterally. Toenails 1-5 b/l elongated, discolored, dystrophic, thickened, crumbly with subungual debris and tenderness to dorsal palpation.  Musculoskeletal:  Normal muscle strength 5/5 to all lower extremity muscle groups bilaterally.  No pain crepitus or joint limitation noted with ROM b/l. No gross bony deformities bilaterally.  Neurological:  Protective sensation diminished with 10g monofilament b/l.  Assessment and Plan:  1. Pain due to onychomycosis of toenails of both feet   2. DM type 2 with diabetic peripheral neuropathy ([place])    -Examined patient. -No new findings. No new orders. -Continue diabetic foot care principles. -Toenails 1-5 b/l were debrided in length and girth with sterile nail nippers and dremel without iatrogenic bleeding.  -Patient to report any pedal injuries to medical professional immediately. -Patient/POA to call should there be question/concern in the interim.  Return in about 3 months (around 04/11/2020) for diabetic foot care.  Marzetta Board, DPM

## 2020-01-14 ENCOUNTER — Telehealth: Payer: Self-pay | Admitting: Family Medicine

## 2020-01-14 NOTE — Telephone Encounter (Signed)
Alyse Low daughter called wanting to see if pt could get flu shot 11/10  Around 12:30 Ok to schedule  They need drive by

## 2020-01-14 NOTE — Telephone Encounter (Signed)
Unless Cardell Peach can go out to the vehicle to do give shot.  Otherwise, pt will need to schedule during [facility].

## 2020-01-14 NOTE — Telephone Encounter (Signed)
See below message for flu shot

## 2020-01-14 NOTE — Telephone Encounter (Signed)
[facility] phone number  [phone]

## 2020-01-15 ENCOUNTER — Ambulatory Visit (INDEPENDENT_AMBULATORY_CARE_PROVIDER_SITE_OTHER): Payer: PPO

## 2020-01-15 DIAGNOSIS — Z23 Encounter for immunization: Secondary | ICD-10-CM

## 2020-01-15 NOTE — Telephone Encounter (Signed)
Flu shot given to patient. See nurse visit.

## 2020-01-15 NOTE — Telephone Encounter (Signed)
Appointment 11/10 see Jeffrey [name]

## 2020-02-07 DIAGNOSIS — Z207 Contact with and (suspected) exposure to pediculosis, acariasis and other infestations: Secondary | ICD-10-CM | POA: Diagnosis not present

## 2020-02-07 DIAGNOSIS — W57XXXA Bitten or stung by nonvenomous insect and other nonvenomous arthropods, initial encounter: Secondary | ICD-10-CM | POA: Diagnosis not present

## 2020-02-08 ENCOUNTER — Other Ambulatory Visit: Payer: Self-pay | Admitting: Family Medicine

## 2020-02-08 DIAGNOSIS — Z1159 Encounter for screening for other viral diseases: Secondary | ICD-10-CM

## 2020-02-08 DIAGNOSIS — E1169 Type 2 diabetes mellitus with other specified complication: Secondary | ICD-10-CM

## 2020-02-08 DIAGNOSIS — M1A9XX0 Chronic gout, unspecified, without tophus (tophi): Secondary | ICD-10-CM

## 2020-02-08 DIAGNOSIS — E785 Hyperlipidemia, unspecified: Secondary | ICD-10-CM

## 2020-02-08 DIAGNOSIS — E1140 Type 2 diabetes mellitus with diabetic neuropathy, unspecified: Secondary | ICD-10-CM

## 2020-02-08 DIAGNOSIS — Z125 Encounter for screening for malignant neoplasm of prostate: Secondary | ICD-10-CM

## 2020-02-08 DIAGNOSIS — D696 Thrombocytopenia, unspecified: Secondary | ICD-10-CM

## 2020-02-11 ENCOUNTER — Other Ambulatory Visit: Payer: PPO

## 2020-02-11 ENCOUNTER — Ambulatory Visit: Payer: PPO

## 2020-02-18 ENCOUNTER — Encounter: Payer: PPO | Admitting: Family Medicine

## 2020-02-20 ENCOUNTER — Telehealth: Payer: PPO

## 2020-03-19 ENCOUNTER — Telehealth: Payer: Self-pay

## 2020-03-19 NOTE — Telephone Encounter (Signed)
Placed form in Dr. G's box.  

## 2020-03-19 NOTE — Telephone Encounter (Signed)
Pt's daughter came in and dropped off form from [place] to be filled out for provider, due to patient being in wheelchair, please give pt's daughter [name] a call. Form placed In presciption tower.

## 2020-03-24 NOTE — Telephone Encounter (Signed)
Spoke with pt's daughter, [name] (on dpr), notifying her the letter is ready to pick up.    [Placed form at front office- yellow folders.  Made copy to scan.]

## 2020-03-24 NOTE — Telephone Encounter (Signed)
Form filled and in Lisa's box.  

## 2020-04-03 DIAGNOSIS — I10 Essential (primary) hypertension: Secondary | ICD-10-CM | POA: Diagnosis not present

## 2020-04-03 DIAGNOSIS — Z8673 Personal history of transient ischemic attack (TIA), and cerebral infarction without residual deficits: Secondary | ICD-10-CM | POA: Diagnosis not present

## 2020-04-03 DIAGNOSIS — Z794 Long term (current) use of insulin: Secondary | ICD-10-CM | POA: Diagnosis not present

## 2020-04-03 DIAGNOSIS — E1142 Type 2 diabetes mellitus with diabetic polyneuropathy: Secondary | ICD-10-CM | POA: Diagnosis not present

## 2020-04-03 DIAGNOSIS — E785 Hyperlipidemia, unspecified: Secondary | ICD-10-CM | POA: Diagnosis not present

## 2020-04-03 DIAGNOSIS — E11319 Type 2 diabetes mellitus with unspecified diabetic retinopathy without macular edema: Secondary | ICD-10-CM | POA: Diagnosis not present

## 2020-04-08 ENCOUNTER — Ambulatory Visit (INDEPENDENT_AMBULATORY_CARE_PROVIDER_SITE_OTHER): Payer: PPO

## 2020-04-08 ENCOUNTER — Other Ambulatory Visit (INDEPENDENT_AMBULATORY_CARE_PROVIDER_SITE_OTHER): Payer: PPO

## 2020-04-08 ENCOUNTER — Other Ambulatory Visit: Payer: Self-pay

## 2020-04-08 DIAGNOSIS — D696 Thrombocytopenia, unspecified: Secondary | ICD-10-CM

## 2020-04-08 DIAGNOSIS — Z1159 Encounter for screening for other viral diseases: Secondary | ICD-10-CM | POA: Diagnosis not present

## 2020-04-08 DIAGNOSIS — Z0000 Encounter for general adult medical examination without abnormal findings: Secondary | ICD-10-CM

## 2020-04-08 DIAGNOSIS — M1A9XX0 Chronic gout, unspecified, without tophus (tophi): Secondary | ICD-10-CM

## 2020-04-08 DIAGNOSIS — E1140 Type 2 diabetes mellitus with diabetic neuropathy, unspecified: Secondary | ICD-10-CM | POA: Diagnosis not present

## 2020-04-08 DIAGNOSIS — E1169 Type 2 diabetes mellitus with other specified complication: Secondary | ICD-10-CM | POA: Diagnosis not present

## 2020-04-08 DIAGNOSIS — Z125 Encounter for screening for malignant neoplasm of prostate: Secondary | ICD-10-CM | POA: Diagnosis not present

## 2020-04-08 DIAGNOSIS — E785 Hyperlipidemia, unspecified: Secondary | ICD-10-CM | POA: Diagnosis not present

## 2020-04-08 LAB — COMPREHENSIVE METABOLIC PANEL
ALT: 23 U/L (ref 0–53)
AST: 17 U/L (ref 0–37)
Albumin: 4.1 g/dL (ref 3.5–5.2)
Alkaline Phosphatase: 94 U/L (ref 39–117)
BUN: 17 mg/dL (ref 6–23)
CO2: 33 mEq/L — ABNORMAL HIGH (ref 19–32)
Calcium: 9.9 mg/dL (ref 8.4–10.5)
Chloride: 102 mEq/L (ref 96–112)
Creatinine, Ser: 1 mg/dL (ref 0.40–1.50)
GFR: 72.74 mL/min (ref >60.00)
Glucose, Bld: 91 mg/dL (ref 70–99)
Potassium: 3.6 mEq/L (ref 3.5–5.1)
Sodium: 141 mEq/L (ref 135–145)
Total Bilirubin: 1 mg/dL (ref 0.2–1.2)
Total Protein: 6.6 g/dL (ref 6.0–8.3)

## 2020-04-08 LAB — LIPID PANEL
Cholesterol: 153 mg/dL (ref 0–200)
HDL: 35.8 mg/dL — ABNORMAL LOW (ref >39.00)
LDL Cholesterol: 82 mg/dL (ref 0–99)
NonHDL: 117.4
Total CHOL/HDL Ratio: 4
Triglycerides: 179 mg/dL — ABNORMAL HIGH (ref 0.0–149.0)
VLDL: 35.8 mg/dL (ref 0.0–40.0)

## 2020-04-08 LAB — CBC WITH DIFFERENTIAL/PLATELET
Basophils Absolute: 0 10*3/uL (ref 0.0–0.1)
Basophils Relative: 0.7 % (ref 0.0–3.0)
Eosinophils Absolute: 0.1 10*3/uL (ref 0.0–0.7)
Eosinophils Relative: 1.8 % (ref 0.0–5.0)
HCT: 41.9 % (ref 39.0–52.0)
Hemoglobin: 14.6 g/dL (ref 13.0–17.0)
Lymphocytes Relative: 23.4 % (ref 12.0–46.0)
Lymphs Abs: 1.7 10*3/uL (ref 0.7–4.0)
MCHC: 34.9 g/dL (ref 30.0–36.0)
MCV: 88.8 fl (ref 78.0–100.0)
Monocytes Absolute: 0.5 10*3/uL (ref 0.1–1.0)
Monocytes Relative: 7 % (ref 3.0–12.0)
Neutro Abs: 4.9 10*3/uL (ref 1.4–7.7)
Neutrophils Relative %: 67.1 % (ref 43.0–77.0)
Platelets: 179 10*3/uL (ref 150.0–400.0)
RBC: 4.72 Mil/uL (ref 4.22–5.81)
RDW: 13.2 % (ref 11.5–15.5)
WBC: 7.3 10*3/uL (ref 4.0–10.5)

## 2020-04-08 LAB — PSA: PSA: 2.53 ng/mL (ref 0.10–4.00)

## 2020-04-08 LAB — URIC ACID: Uric Acid, Serum: 5.9 mg/dL (ref 4.0–7.8)

## 2020-04-08 LAB — HEMOGLOBIN A1C: Hgb A1c MFr Bld: 7.9 % — ABNORMAL HIGH (ref 4.6–6.5)

## 2020-04-08 NOTE — Progress Notes (Signed)
Subjective:   Jeffrey [name] is a 80 y.o. male who presents for Medicare Annual/Subsequent preventive examination.  Review of Systems: N/A      I connected with the patient today by telephone and verified that I am speaking with the correct person using two identifiers. Location patient: home Location nurse: work Persons participating in the telephone visit: patient, nurse.   I discussed the limitations, risks, security and privacy concerns of performing an evaluation and management service by telephone and the availability of in person appointments. I also discussed with the patient that there may be a patient responsible charge related to this service. The patient expressed understanding and verbally consented to this telephonic visit.        Cardiac Risk Factors include: advanced age (>43men, >62 women);male gender;diabetes mellitus;hypertension;Other (see comment), Risk factor comments: hyperlipidemia     Objective:    Today's Vitals   There is no height or weight on file to calculate BMI.  Advanced Directives 04/08/2020 10/31/2017 03/15/2017 04/20/2015 04/02/2015 03/24/2015 03/19/2015  Does Patient Have a Medical Advance Directive? Yes No Yes Yes Yes Yes Yes  Type of Paramedic of [place];Living will - [place];Living will Healthcare Power of [place] of [place]  Does patient want to make changes to medical advance directive? - - - - - - -  Copy of [place] in Chart? Yes - validated most recent copy scanned in chart (See row information) - Yes - - - -  Would patient like information on creating a medical advance directive? - No - Patient declined - - - - -  Pre-existing out of facility DNR order (yellow form or pink MOST form) - - - - - - -    Current Medications (verified) Outpatient Encounter Medications as of 04/08/2020  Medication Sig  .  atorvastatin (LIPITOR) 80 MG tablet Take 80 mg by mouth at bedtime.  . baclofen (LIORESAL) 10 MG tablet Take 0.5 tablets (5 mg total) by mouth 3 (three) times daily. (Patient taking differently: Take 1 mg by mouth 3 (three) times daily. 1 tablet TID)  . Cholecalciferol (VITAMIN D3) 5000 UNITS TABS Take 5,000 Units by mouth once a week.  . clopidogrel (PLAVIX) 75 MG tablet Take 1 tablet (75 mg total) by mouth daily.  . fosinopril ([name]) 40 MG tablet Take 40 mg by mouth daily.  . furosemide (LASIX) 20 MG tablet Take 0.5-1 tablets (10-20 mg total) by mouth daily.  . insulin aspart (NOVOLOG) 100 UNIT/ML FlexPen Inject 10 Units into the skin as directed. 10 units with breakfast 10 units with supper  . insulin glargine (LANTUS) 100 UNIT/ML injection Inject 0.24 mLs (24 Units total) into the skin at bedtime.  [name] JARDIANCE 10 MG TABS tablet Take 10 mg by mouth daily.  [name] LACTOBACILLUS PO Take 1 capsule by mouth daily. [facility] colon health.  Takes M/W/F  . loratadine (CLARITIN) 10 MG tablet Take 10 mg by mouth daily as needed.   . metoprolol tartrate (LOPRESSOR) 25 MG tablet Take 1 tablet (25 mg total) by mouth 2 (two) times daily.  . saw palmetto 80 MG capsule Take 1 capsule (80 mg total) by mouth daily.  . sertraline (ZOLOFT) 25 MG tablet Take 1 tablet (25 mg total) by mouth daily.   No facility-administered encounter medications on file as of 04/08/2020.    Allergies (verified) Niacin   History: Past Medical History:  Diagnosis  Date  . Anterior cerebral circulation hemorrhagic infarction [facility]) 11/24/2014   March, 2016, dominant left thalamic and left internal capsule ischemic infarct with resultant hemorrhagic transformation, secondary to small vessel disease. Resulted in right hemiparesis dysarthria and diplopia   //   readmission with aphasia July, 2016, this improved   . Arthritis   . CAD (coronary artery disease)    PCI distal RCA ...2004, residual 70% LAD   /   ...nuclear...03/2007...no  ischemia.Marland KitchenMarland Kitchenpreserved LV /  nuclear...03/03/2009...inferior scar..no ischemia..EF 51%  . Cyst of nasopharynx    per ENT Wilburn Cornelia  . Dyslipidemia    takes Atorvastatin daily  . GERD (gastroesophageal reflux disease)    takes Omeprazole daily  . Gout    takes Allopurinol daily and Colchicine as needed  . HCAP (healthcare-associated pneumonia)   . Hemiparesis affecting right side as late effect of cerebrovascular accident ([place]) 09/05/2014  . History of colon polyps   . HTN (hypertension)    takes Cardura,Metoprolol,[name],and Amlodipine daily  . Internal hemorrhoids   . Myocardial infarction [facility]) 44yrs ago  . Peripheral edema    takes Lasix daily  . 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 Right carotid bruit   . Seasonal allergic rhinitis   . Stroke ([place])   . Type 2 diabetes, uncontrolled, with neuropathy ([place]) 1989   takes Invokana daily and has an insulin pump (Dr. [name])   Past Surgical History:  Procedure Laterality Date  . BALLOON ANGIOPLASTY, ARTERY  1992, 2004   CAD, Dr. Ron Parker  . CARDIAC CATHETERIZATION  2004  . CATARACT EXTRACTION Bilateral 2010  . COLONOSCOPY  04/03/2002   adenomatous polyp, int hemorrhoids  . COLONOSCOPY  07/18/2007   normal (Dr. Fuller Plan)  . COLONOSCOPY  09/26/2012   tubular adenoma, sm int hem, rpt 5 yrs Fuller Plan)  . ELBOW SURGERY Right 1997  . eye lids raised    . NASAL SEPTUM SURGERY    . POLYPECTOMY N/A 05/27/2013   Procedure: ENDOSCOPIC NASOPHARYNGEAL MASS;  Surgeon: [name], MD  . [place]  09/2011   left, with subacromial decompression  . TONSILLECTOMY AND ADENOIDECTOMY     Family History  Problem Relation Age of Onset  . Alcohol abuse Father   . Heart attack Father   . Heart attack Son   . Coronary artery disease Neg Hx   . Stroke Neg Hx   . Cancer Neg Hx   . Diabetes Neg Hx   . Colon cancer Neg Hx    Social History   Socioeconomic History  . Marital status: Married     Spouse name: Not on file  . Number of children: 2  . Years of education: Bachelors  . Highest education level: Not on file  Occupational History  . Occupation: Music therapist: [employer]  Tobacco Use  . Smoking status: Former Research scientist (life sciences)  . Smokeless tobacco: Never Used  . Tobacco comment: quit smoking 45 years ago.  Vaping Use  . Vaping Use: Never used  Substance and Sexual Activity  . Alcohol use: No  . Drug use: No  . Sexual activity: Yes  Other Topics Concern  . Not on file  Social History Narrative   Caffeine: 6-7 cups coffee   Lives with wife, no pets   2 grown children, 4 grandchildren   Occu: Artist   Edu: [employer]   Activity: not much   Diet: plenty of water, fruits and  vegetables   Some care through [place]      Endo: Dr. Buddy Duty   Social Determinants of Health   Financial Resource Strain: Low Risk   . Difficulty of Paying Living Expenses: Not hard at all  Food Insecurity: No Food Insecurity  . Worried About Charity fundraiser in the Last Year: Never true  . Ran Out of Food in the Last Year: Never true  Transportation Needs: No Transportation Needs  . Lack of Transportation (Medical): No  . Lack of Transportation (Non-Medical): No  Physical Activity: Sufficiently Active  . Days of Exercise per Week: 5 days  . Minutes of Exercise per Session: 30 min  Stress: No Stress Concern Present  . Feeling of Stress : Not at all  Social Connections: Not on file    Tobacco Counseling Counseling given: Not Answered Comment: quit smoking 45 years ago.   Clinical Intake:  Pre-visit preparation completed: Yes  Pain : No/denies pain     Nutritional Risks: None Diabetes: Yes CBG done?: No Did pt. bring in CBG monitor from home?: No  How often do you need to have someone help you when you read instructions, pamphlets, or other written materials from your doctor or pharmacy?: 1 - Never What is the last grade level you  completed in school?: bachelors  Diabetic: yes Nutrition Risk Assessment:  Has the patient had any N/V/D within the last 2 months?  No  Does the patient have any non-healing wounds?  No  Has the patient had any unintentional weight loss or weight gain?  No   Diabetes:  Is the patient diabetic?  Yes  If diabetic, was a CBG obtained today?  No  telephone visit  Did the patient bring in their glucometer from home?  No  telephone visit  How often do you monitor your CBG's? everyday.   Financial Strains and Diabetes Management:  Are you having any financial strains with the device, your supplies or your medication? No .  Does the patient want to be seen by Chronic Care Management for management of their diabetes?  No  Would the patient like to be referred to a Nutritionist or for Diabetic Management?  No   Diabetic Exams:  Diabetic Eye Exam: Overdue for diabetic eye exam. Pt has been advised about the importance in completing this exam. Patient advised to call and schedule an eye exam. Diabetic Foot Exam: Completed 04/03/2020   Interpreter Needed?: No  Information entered by :: [place], LPN   Activities of Daily Living In your present state of health, do you have any difficulty performing the following activities: 04/08/2020  Hearing? Y  Comment wears hearing aids  Vision? N  Difficulty concentrating or making decisions? N  Walking or climbing stairs? N  Dressing or bathing? N  Doing errands, shopping? Y  Preparing Food and eating ? N  Using the Toilet? N  In the past six months, have you accidently leaked urine? Y  Do you have problems with loss of bowel control? N  Managing your Medications? N  Managing your Finances? N  Housekeeping or managing your Housekeeping? Y  Some recent data might be hidden    Patient Care Team: [name], MD as PCP - General (Family Medicine) Jerline Pain, MD as PCP - Cardiology (Cardiology) Delia Chimes, NP (Inactive) as Nurse  Practitioner (Nurse Practitioner)  Indicate any recent Medical Services you may have received from other than [facility] providers in the past year (date may be approximate).  Assessment:   This is a routine wellness examination for [place].  Hearing/Vision screen  Hearing Screening   125Hz  250Hz  500Hz  1000Hz  2000Hz  3000Hz  4000Hz  6000Hz  8000Hz   Right ear:           Left ear:           Vision Screening Comments: Patient gets annual eye exams   Dietary issues and exercise activities discussed: Current Exercise Habits: Home exercise routine, Type of exercise: walking, Time (Minutes): 30, Frequency (Times/Week): 7, Weekly Exercise (Minutes/Week): 210, Intensity: Moderate, Exercise limited by: None identified  Goals    . Increase physical activity     Starting 10/31/2017, I will attempt to do physical therapy exercises for at least 15 minutes 5 days per week.     . Patient Stated     04/08/2020, I will continue to walk daily for about 30 minutes.     . Pharmacy Care Plan     CARE PLAN ENTRY  Current Barriers:  . Chronic Disease Management support, education, and care coordination needs related to Hypertension and Diabetes   Hypertension BP Readings from Last 3 Encounters:  08/27/19 (!) 156/70  01/15/19 140/70  06/05/18 140/66 .  Pharmacist Clinical Goal(s): o Over the next 3 months, patient will work with PharmD and providers to maintain BP goal <140/90 mmHg . Current regimen:   Fosinopril 40 mg - 1 tablet daily   Losartan 50 mg - 1 tablet daily   Metoprolol tartrate 25 mg- 1 tablet twice daily . Interventions: o Reviewed safety, fosinopril and losartan are duplicate therapies and should not be taken together.  . Patient self care activities - Over the next 3 months, patient will: o Please review patients medications and see if patient is taking fosinopril and losartan. Call [place] if taking both.  o Check blood pressure 5-7 days leading up to appointments, document, and  provide at each visit o Ensure daily salt intake < 2300 mg/day  Diabetes Lab Results  Component Value Date/Time   HGBA1C 8.1 (A) 08/27/2019 11:44 AM   HGBA1C 9.2 (A) 01/15/2019 02:09 PM   HGBA1C 8.7 (A) 01/30/2018 12:00 AM   HGBA1C 8.7 01/30/2018 12:00 AM   HGBA1C 8.2 (H) 10/31/2017 12:50 PM   HGBA1C 8.1 07/26/2017 12:00 AM .  Pharmacist Clinical Goal(s): o Over the next 3 months, patient will work with PharmD and providers to achieve A1c goal <7% . Current regimen:   Novolog - Inject 10 units with breakfast, 10 units units with supper   Lantus - Inject 26 units at bedtime   Per [name], patient also supposed to start Trulicity [id number] but [facility] has not approved . Interventions: o Contacted [facility] to see if there was a problem with Trulicity prescription. Left voicemail with pharmacist.  . Patient self care activities - Over the next 3 months, patient will: o Check blood sugar daily before breakfast, document, and provide at future appointment o Contact provider with any episodes of hypoglycemia o Please call Sharyn Lull if Trulicity not approved through [place]  Initial goal documentation      Depression Screen PHQ 2/9 Scores 04/08/2020 01/15/2019 10/31/2017 07/27/2016 07/23/2015 12/17/2014  PHQ - 2 Score 0 0 0 0 0 0  PHQ- 9 Score 0 2 0 - - -    Fall Risk Fall Risk  04/08/2020 01/30/2019 01/15/2019 10/31/2017 07/27/2016  Falls in the past year? 1 1 0 No No  Comment - Emmi Telephone Survey: data to providers prior to load - - -  Number falls  in past yr: 1 1 1  - -  Comment - [facility]i Telephone Survey Actual Response = 2 - - -  Injury with Fall? 0 0 0 - -  Risk for fall due to : History of fall(s);Medication side effect;Impaired balance/gait - - - -  Follow up Falls evaluation completed;Falls prevention discussed - - - -    FALL RISK PREVENTION PERTAINING TO THE HOME:  Any stairs in or around the home? Yes  If so, are there any without handrails? No  Home free of loose throw rugs in walkways,  pet beds, electrical cords, etc? Yes  Adequate lighting in your home to reduce risk of falls? Yes   ASSISTIVE DEVICES UTILIZED TO PREVENT FALLS:  Life alert? No  Use of a cane, walker or w/c? Yes , wheelchair Grab bars in the bathroom? Yes  Shower chair or bench in shower? Yes  Elevated toilet seat or a handicapped toilet? No   TIMED UP AND GO:  Was the test performed? N/A telephone visit.    Cognitive Function: MMSE - Mini Mental State Exam 04/08/2020 10/31/2017  Orientation to time 5 5  Orientation to Place 5 5  Registration 3 3  Attention/ Calculation 5 0  Recall 3 3  Language- name 2 objects - 0  Language- repeat 1 1  Language- follow 3 step command - 3  Language- read & follow direction - 0  Write a sentence - 0  Copy design - 0  Total score - 20  Mini Cog  Mini-Cog screen was completed. Maximum score is 22. A value of 0 denotes this part of the MMSE was not completed or the patient failed this part of the Mini-Cog screening.       Immunizations Immunization History  Administered Date(s) Administered  . Fluad Quad(high Dose 65+) 01/01/2019, 01/15/2020  . Influenza Split 02/01/2012, 01/17/2013, 02/04/2014, 12/08/2014, 12/22/2015  . Influenza, High Dose Seasonal PF 01/30/2018  . Influenza,inj,Quad PF,6+ Mos 11/01/2010, 12/22/2015  . Influenza-Unspecified 02/04/2014, 12/08/2014, 12/29/2016  . PFIZER(Purple Top)SARS-COV-2 Vaccination 04/30/2019, 05/21/2019  . Pneumococcal Conjugate-13 02/04/2014  . Pneumococcal Polysaccharide-23 03/07/2004, 10/11/2012  . Zoster 03/08/2011  . Zoster Recombinat (Shingrix) 10/05/2017, 04/07/2018    TDAP status: Due, Education has been provided regarding the importance of this vaccine. Advised may receive this vaccine at local pharmacy or Health Dept. Aware to provide a copy of the vaccination record if obtained from local pharmacy or Health Dept. Verbalized acceptance and understanding.  Flu Vaccine status: Up to date  Pneumococcal  vaccine status: Up to date  Covid-19 vaccine status: 2 doses completed, will call and schedule appointment for booster soon   Qualifies for Shingles Vaccine? Yes   Zostavax completed Yes   Shingrix Completed?: Yes  Screening Tests Health Maintenance  Topic Date Due  . Hepatitis C Screening  Never done  . COVID-19 Vaccine (3 - Booster for Pfizer series) 11/21/2019  . OPHTHALMOLOGY EXAM  11/30/2019  . HEMOGLOBIN A1C  02/26/2020  . TETANUS/TDAP  04/09/2023 (Originally 01/16/1960)  . COLONOSCOPY (Pts 45-73yrs Insurance coverage will need to be confirmed)  10/31/2048 (Originally 09/26/2017)  . FOOT EXAM  04/03/2021  . INFLUENZA VACCINE  Completed  . PNA vac Low Risk Adult  Completed    Health Maintenance  Health Maintenance Due  Topic Date Due  . Hepatitis C Screening  Never done  . COVID-19 Vaccine (3 - Booster for Pfizer series) 11/21/2019  . OPHTHALMOLOGY EXAM  11/30/2019  . HEMOGLOBIN A1C  02/26/2020    Colorectal cancer  screening: No longer required.   Lung Cancer Screening: (Low Dose CT Chest recommended if Age 76-80 years, 30 pack-year currently smoking OR have quit w/in 15years.) does not qualify.    Additional Screening:  Hepatitis C Screening: does qualify; Completed due  Vision Screening: Recommended annual ophthalmology exams for early detection of glaucoma and other disorders of the eye. Is the patient up to date with their annual eye exam?  Yes  Who is the provider or what is the name of the office in which the patient attends annual eye exams? Dr. Ellie Lunch  If pt is not established with a provider, would they like to be referred to a provider to establish care? No .   Dental Screening: Recommended annual dental exams for proper oral hygiene  Community Resource Referral / Chronic Care Management: CRR required this visit?  No   CCM required this visit?  No      Plan:     I have personally reviewed and noted the following in the patient's chart:    . Medical and social history . Use of alcohol, tobacco or illicit drugs  . Current medications and supplements . Functional ability and status . Nutritional status . Physical activity . Advanced directives . List of other physicians . Hospitalizations, surgeries, and ER visits in previous 12 months . Vitals . Screenings to include cognitive, depression, and falls . Referrals and appointments  In addition, I have reviewed and discussed with patient certain preventive protocols, quality metrics, and best practice recommendations. A written personalized care plan for preventive services as well as general preventive health recommendations were provided to patient.   Due to this being a telephonic visit, the after visit summary with patients personalized plan was offered to patient via office or my-chart. Patient preferred to pick up at office at next visit or via mychart.   Andrez Grime, LPN   [id number]

## 2020-04-08 NOTE — Patient Instructions (Signed)
Jeffrey [name] , Thank you for taking time to come for your Medicare Wellness Visit. I appreciate your ongoing commitment to your health goals. Please review the following plan we discussed and let me know if I can assist you in the future.   Screening recommendations/referrals: Colonoscopy: no longer required  Recommended yearly ophthalmology/optometry visit for glaucoma screening and checkup Recommended yearly dental visit for hygiene and checkup  Vaccinations: Influenza vaccine: Up to date, completed 01/15/2020 due 10/2020 Pneumococcal vaccine: Completed series Tdap vaccine: decline- insurance  Shingles vaccine: Completed series   Covid-19: Please call and schedule booster appointment  Advanced directives: copy in chart  Conditions/risks identified: diabetes, hypertension, hyperlipidemia   Next appointment: Follow up in one year for your annual wellness visit.   Preventive Care 4 Years and Older, Male Preventive care refers to lifestyle choices and visits with your health care provider that can promote health and wellness. What does preventive care include?  A yearly physical exam. This is also called an annual well check.  Dental exams once or twice a year.  Routine eye exams. Ask your health care provider how often you should have your eyes checked.  Personal lifestyle choices, including:  Daily care of your teeth and gums.  Regular physical activity.  Eating a healthy diet.  Avoiding tobacco and drug use.  Limiting alcohol use.  Practicing safe sex.  Taking low doses of aspirin every day.  Taking vitamin and mineral supplements as recommended by your health care provider. What happens during an annual well check? The services and screenings done by your health care provider during your annual well check will depend on your age, overall health, lifestyle risk factors, and family history of disease. Counseling  Your health care provider may ask you questions about  your:  Alcohol use.  Tobacco use.  Drug use.  Emotional well-being.  Home and relationship well-being.  Sexual activity.  Eating habits.  History of falls.  Memory and ability to understand (cognition).  Work and work Statistician. Screening  You may have the following tests or measurements:  Height, weight, and BMI.  Blood pressure.  Lipid and cholesterol levels. These may be checked every 5 years, or more frequently if you are over 74 years old.  Skin check.  Lung cancer screening. You may have this screening every year starting at age 41 if you have a 30-pack-year history of smoking and currently smoke or have quit within the past 15 years.  Fecal occult blood test (FOBT) of the stool. You may have this test every year starting at age 22.  Flexible sigmoidoscopy or colonoscopy. You may have a sigmoidoscopy every 5 years or a colonoscopy every 10 years starting at age 39.  Prostate cancer screening. Recommendations will vary depending on your family history and other risks.  Hepatitis C blood test.  Hepatitis B blood test.  Sexually transmitted disease (STD) testing.  Diabetes screening. This is done by checking your blood sugar (glucose) after you have not eaten for a while (fasting). You may have this done every 1-3 years.  Abdominal aortic aneurysm (AAA) screening. You may need this if you are a current or former smoker.  Osteoporosis. You may be screened starting at age 27 if you are at high risk. Talk with your health care provider about your test results, treatment options, and if necessary, the need for more tests. Vaccines  Your health care provider may recommend certain vaccines, such as:  Influenza vaccine. This is recommended every year.  Tetanus, diphtheria, and acellular pertussis (Tdap, Td) vaccine. You may need a Td booster every 10 years.  Zoster vaccine. You may need this after age 3.  Pneumococcal 13-valent conjugate (PCV13) vaccine.  One dose is recommended after age 82.  Pneumococcal polysaccharide (PPSV23) vaccine. One dose is recommended after age 60. Talk to your health care provider about which screenings and vaccines you need and how often you need them. This information is not intended to replace advice given to you by your health care provider. Make sure you discuss any questions you have with your health care provider. Document Released: 03/20/2015 Document Revised: 11/11/2015 Document Reviewed: 12/23/2014 Elsevier Interactive Patient Education  2017 [place] Prevention in the Home Falls can cause injuries. They can happen to people of all ages. There are many things you can do to make your home safe and to help prevent falls. What can I do on the outside of my home?  Regularly fix the edges of walkways and driveways and fix any cracks.  Remove anything that might make you trip as you walk through a door, such as a raised step or threshold.  Trim any bushes or trees on the path to your home.  Use bright outdoor lighting.  Clear any walking paths of anything that might make someone trip, such as rocks or tools.  Regularly check to see if handrails are loose or broken. Make sure that both sides of any steps have handrails.  Any raised decks and porches should have guardrails on the edges.  Have any leaves, snow, or ice cleared regularly.  Use sand or salt on walking paths during winter.  Clean up any spills in your garage right away. This includes oil or grease spills. What can I do in the bathroom?  Use night lights.  Install grab bars by the toilet and in the tub and shower. Do not use towel bars as grab bars.  Use non-skid mats or decals in the tub or shower.  If you need to sit down in the shower, use a plastic, non-slip stool.  Keep the floor dry. Clean up any water that spills on the floor as soon as it happens.  Remove soap buildup in the tub or shower regularly.  Attach bath  mats securely with double-sided non-slip rug tape.  Do not have throw rugs and other things on the floor that can make you trip. What can I do in the bedroom?  Use night lights.  Make sure that you have a light by your bed that is easy to reach.  Do not use any sheets or blankets that are too big for your bed. They should not hang down onto the floor.  Have a firm chair that has side arms. You can use this for support while you get dressed.  Do not have throw rugs and other things on the floor that can make you trip. What can I do in the kitchen?  Clean up any spills right away.  Avoid walking on wet floors.  Keep items that you use a lot in easy-to-reach places.  If you need to reach something above you, use a strong step stool that has a grab bar.  Keep electrical cords out of the way.  Do not use floor polish or wax that makes floors slippery. If you must use wax, use non-skid floor wax.  Do not have throw rugs and other things on the floor that can make you trip. What can I do  with my stairs?  Do not leave any items on the stairs.  Make sure that there are handrails on both sides of the stairs and use them. Fix handrails that are broken or loose. Make sure that handrails are as long as the stairways.  Check any carpeting to make sure that it is firmly attached to the stairs. Fix any carpet that is loose or worn.  Avoid having throw rugs at the top or bottom of the stairs. If you do have throw rugs, attach them to the floor with carpet tape.  Make sure that you have a light switch at the top of the stairs and the bottom of the stairs. If you do not have them, ask someone to add them for you. What else can I do to help prevent falls?  Wear shoes that:  Do not have high heels.  Have rubber bottoms.  Are comfortable and fit you well.  Are closed at the toe. Do not wear sandals.  If you use a stepladder:  Make sure that it is fully opened. Do not climb a closed  stepladder.  Make sure that both sides of the stepladder are locked into place.  Ask someone to hold it for you, if possible.  Clearly mark and make sure that you can see:  Any grab bars or handrails.  First and last steps.  Where the edge of each step is.  Use tools that help you move around (mobility aids) if they are needed. These include:  Canes.  Walkers.  Scooters.  Crutches.  Turn on the lights when you go into a dark area. Replace any light bulbs as soon as they burn out.  Set up your furniture so you have a clear path. Avoid moving your furniture around.  If any of your floors are uneven, fix them.  If there are any pets around you, be aware of where they are.  Review your medicines with your doctor. Some medicines can make you feel dizzy. This can increase your chance of falling. Ask your doctor what other things that you can do to help prevent falls. This information is not intended to replace advice given to you by your health care provider. Make sure you discuss any questions you have with your health care provider. Document Released: 12/18/2008 Document Revised: 07/30/2015 Document Reviewed: 03/28/2014 Elsevier Interactive Patient Education  2017 [employer].

## 2020-04-08 NOTE — Progress Notes (Signed)
PCP notes:  Health Maintenance: Tdap- insurance Eye exam- appointment scheduled for this week Covid booster- due   Abnormal Screenings: none   Patient concerns: Abdominal weight gain    Nurse concerns: none   Next PCP appt.: 04/15/2020 @ 2:30 pm

## 2020-04-09 LAB — HEPATITIS C ANTIBODY
Hepatitis C Ab: NONREACTIVE
SIGNAL TO CUT-OFF: 0.01 (ref <1.00)

## 2020-04-10 DIAGNOSIS — L720 Epidermal cyst: Secondary | ICD-10-CM | POA: Diagnosis not present

## 2020-04-10 DIAGNOSIS — X32XXXD Exposure to sunlight, subsequent encounter: Secondary | ICD-10-CM | POA: Diagnosis not present

## 2020-04-10 DIAGNOSIS — L570 Actinic keratosis: Secondary | ICD-10-CM | POA: Diagnosis not present

## 2020-04-10 DIAGNOSIS — L821 Other seborrheic keratosis: Secondary | ICD-10-CM | POA: Diagnosis not present

## 2020-04-10 DIAGNOSIS — L820 Inflamed seborrheic keratosis: Secondary | ICD-10-CM | POA: Diagnosis not present

## 2020-04-15 ENCOUNTER — Encounter: Payer: Self-pay | Admitting: Family Medicine

## 2020-04-15 ENCOUNTER — Other Ambulatory Visit: Payer: Self-pay

## 2020-04-15 ENCOUNTER — Ambulatory Visit (INDEPENDENT_AMBULATORY_CARE_PROVIDER_SITE_OTHER): Payer: PPO | Admitting: Family Medicine

## 2020-04-15 VITALS — BP 134/62 | HR 60 | Temp 98.2°F | Ht 67.0 in | Wt 171.1 lb

## 2020-04-15 DIAGNOSIS — E1140 Type 2 diabetes mellitus with diabetic neuropathy, unspecified: Secondary | ICD-10-CM

## 2020-04-15 DIAGNOSIS — M1A9XX0 Chronic gout, unspecified, without tophus (tophi): Secondary | ICD-10-CM

## 2020-04-15 DIAGNOSIS — Z0000 Encounter for general adult medical examination without abnormal findings: Secondary | ICD-10-CM

## 2020-04-15 DIAGNOSIS — E1169 Type 2 diabetes mellitus with other specified complication: Secondary | ICD-10-CM | POA: Diagnosis not present

## 2020-04-15 DIAGNOSIS — I69351 Hemiplegia and hemiparesis following cerebral infarction affecting right dominant side: Secondary | ICD-10-CM | POA: Diagnosis not present

## 2020-04-15 DIAGNOSIS — I5032 Chronic diastolic (congestive) heart failure: Secondary | ICD-10-CM

## 2020-04-15 DIAGNOSIS — Z7409 Other reduced mobility: Secondary | ICD-10-CM

## 2020-04-15 DIAGNOSIS — I152 Hypertension secondary to endocrine disorders: Secondary | ICD-10-CM

## 2020-04-15 DIAGNOSIS — E1159 Type 2 diabetes mellitus with other circulatory complications: Secondary | ICD-10-CM | POA: Diagnosis not present

## 2020-04-15 DIAGNOSIS — I69398 Other sequelae of cerebral infarction: Secondary | ICD-10-CM

## 2020-04-15 DIAGNOSIS — E785 Hyperlipidemia, unspecified: Secondary | ICD-10-CM

## 2020-04-15 DIAGNOSIS — F0631 Mood disorder due to known physiological condition with depressive features: Secondary | ICD-10-CM

## 2020-04-15 DIAGNOSIS — Z7189 Other specified counseling: Secondary | ICD-10-CM

## 2020-04-15 NOTE — Progress Notes (Signed)
Patient ID: Jeffrey [name], male    DOB: 21-Apr-1940, 80 y.o.   MRN: [id number]  This visit was conducted in person.  BP 134/62   Pulse 60   Temp 98.2 F (36.8 C) (Temporal)   Ht 5\' 7"  (1.702 m)   Wt 171 lb 1 oz (77.6 kg)   SpO2 95%   BMI 26.79 kg/m    CC: CPE Subjective:   HPI: Jeffrey [name] is a 80 y.o. male presenting on 04/15/2020 for Annual Exam (Prt 2.  Pt accompanied by daughter, [name]- temp 98.0.)   Saw health advisor last week for medicare wellness visit. Note reviewed.   No exam data present  Flowsheet Row Clinical Support from 04/08/2020 in [place] at [facility] Total Score 0      Fall Risk  04/08/2020 01/30/2019 01/15/2019 10/31/2017 07/27/2016  Falls in the past year? 1 1 0 No No  Comment - Emmi Telephone Survey: data to providers prior to load - - -  Number falls in past yr: 1 1 1  - -  Comment - Emmi Telephone Survey Actual Response = 2 - - -  Injury with Fall? 0 0 0 - -  Risk for fall due to : History of fall(s);Medication side effect;Impaired balance/gait - - - -  Follow up Falls evaluation completed;Falls prevention discussed - - - -  Fall earlier in the year - lost his balance without injury.    Hemorrhagic L thalamus stroke 05/2014 with residual R hemiparesis, dysmetria of RUE and RLE, diplopia. Right handed. Saw [facility]ophth for persistent diplopia with R homonymous hemianopsia s/p CVA.   EndocrinologyDM followed byDr Elliot Cousin mo - sees regularly. Recently started on jardiance 2 wks ago.  CAD followed by cardiology Marlou Porch).  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  Takes lasix 20mg  daily, 40mg  on T/Th. [facility]   Asks about PWD coverage for wheelchair with larger wheels so he is able to navigate better outside of the house.   Preventative: COLONOSCOPY Date: 09/26/2012 tubular adenoma, sm int hem, rpt 5 yrs [facility] Plan). Pt declines repeat at this time given h/o CVA. Prostate cancer screening - no fmhx prostate  cancer. None recent, no problems in the past. nocturia x2-3 fills 2 urinals.Declines screening at this time. Saw palmetto didn't help.  Lung cancer screening - not candidate - remote smoker Flu shot - yearly [place] 04/2019, 05/2019, pending booster next week Prevnar 2015, pneumovax 2014 Zoster- 2013.  shingrix - 10/2017, 04/2018  Advanced directive discussion - HCPOA would be daughter Alyse Low. Has advanced directive at home. Asked to bring [place] a copy. DNR  Seat belt use discussed. Sunscreen use and skin screen discussed, no changing moles on skin. Ex smoker, quit remotely Alcohol - none Dentist Q6 mo  Eye exam - yearly  Bowel - no diarrhea/constipation  Bladder - "minor" incontinence   Caffeine: 6-7 cups coffee  Lives with wife, no pets 2 grown children, 4 grandchildren  Special educational needs teacher Edu: [employer]  Activity: not much  Diet: plenty of water, fruits and vegetables  Some care through [place]     Relevant past medical, surgical, family and social history reviewed and updated as indicated. Interim medical history since our last visit reviewed. Allergies and medications reviewed and updated. Outpatient Medications Prior to Visit  Medication Sig Dispense Refill  . atorvastatin (LIPITOR) 80 MG tablet Take 80 mg by mouth at bedtime.    . baclofen (LIORESAL) 10 MG tablet  Take 0.5 tablets (5 mg total) by mouth 3 (three) times daily. (Patient taking differently: Take 1 mg by mouth 3 (three) times daily. 1 tablet TID) 60 each 3  . Cholecalciferol (VITAMIN D3) 5000 UNITS TABS Take 5,000 Units by mouth once a week.    . clopidogrel (PLAVIX) 75 MG tablet Take 1 tablet (75 mg total) by mouth daily. 30 tablet 6  . fosinopril ([name]) 40 MG tablet Take 40 mg by mouth daily.    . insulin aspart (NOVOLOG) 100 UNIT/ML FlexPen Inject 10 Units into the skin as directed. 10 units with breakfast 10 units with supper    . insulin glargine (LANTUS) 100 UNIT/ML  injection Inject 0.24 mLs (24 Units total) into the skin at bedtime.    [name] JARDIANCE 10 MG TABS tablet Take 10 mg by mouth daily.    [name] LACTOBACILLUS PO Take 1 capsule by mouth daily. [facility] colon health.  Takes M/W/F    . loratadine (CLARITIN) 10 MG tablet Take 10 mg by mouth daily as needed.     . metoprolol tartrate (LOPRESSOR) 25 MG tablet Take 1 tablet (25 mg total) by mouth 2 (two) times daily.    . sertraline (ZOLOFT) 25 MG tablet Take 1 tablet (25 mg total) by mouth daily. 30 tablet 11  . furosemide (LASIX) 20 MG tablet Take 0.5-1 tablets (10-20 mg total) by mouth daily. (Patient taking differently: Take 10-20 mg by mouth daily. Takes 40 mg Tues and Thurs) 90 tablet 1  . furosemide (LASIX) 20 MG tablet Take 20mg  by mouth daily, take 40mg  on Tues/Thurs    . saw palmetto 80 MG capsule Take 1 capsule (80 mg total) by mouth daily.     No facility-administered medications prior to visit.     Per HPI unless specifically indicated in ROS section below Review of Systems  Constitutional: Negative for activity change, appetite change, chills, fatigue, fever and unexpected weight change.  HENT: Negative for hearing loss.   Eyes: Negative for visual disturbance.  Respiratory: Negative for cough, chest tightness, shortness of breath and wheezing.   Cardiovascular: Positive for leg swelling. Negative for chest pain and palpitations.  Gastrointestinal: Negative for abdominal distention, abdominal pain, blood in stool, constipation, diarrhea, nausea and vomiting.  Genitourinary: Negative for difficulty urinating and hematuria.  Musculoskeletal: Negative for arthralgias, myalgias and neck pain.  Skin: Negative for rash.  Neurological: Negative for dizziness, seizures, syncope and headaches.  Hematological: Negative for adenopathy. Bruises/bleeds easily.  Psychiatric/Behavioral: Negative for dysphoric mood. The patient is not nervous/anxious.    Objective:  BP 134/62   Pulse 60   Temp 98.2 F  (36.8 C) (Temporal)   Ht 5\' 7"  (1.702 m)   Wt 171 lb 1 oz (77.6 kg)   SpO2 95%   BMI 26.79 kg/m   Wt Readings from Last 3 Encounters:  04/15/20 171 lb 1 oz (77.6 kg)  10/02/19 175 lb 12.8 oz (79.7 kg)  08/27/19 174 lb 9 oz (79.2 kg)      Physical Exam Vitals and nursing note reviewed.  Constitutional:      General: He is not in acute distress.    Appearance: Normal appearance. He is well-developed and well-nourished. He is not ill-appearing.  HENT:     Head: Normocephalic and atraumatic.     Right Ear: Hearing, tympanic membrane, ear canal and external ear normal.     Left Ear: Hearing, tympanic membrane, ear canal and external ear normal.     Mouth/Throat:  Mouth: Oropharynx is clear and moist and mucous membranes are normal.     Pharynx: No posterior oropharyngeal edema.  Eyes:     General: No scleral icterus.    Extraocular Movements: Extraocular movements intact and EOM normal.     Conjunctiva/sclera: Conjunctivae normal.     Pupils: Pupils are equal, round, and reactive to light.  Neck:     Thyroid: No thyroid mass or thyromegaly.     Vascular: No carotid bruit.  Cardiovascular:     Rate and Rhythm: Normal rate and regular rhythm.     Pulses: Normal pulses and intact distal pulses.          Radial pulses are 2+ on the right side and 2+ on the left side.     Heart sounds: Normal heart sounds. No murmur heard.   Pulmonary:     Effort: Pulmonary effort is normal. No respiratory distress.     Breath sounds: Normal breath sounds. No wheezing, rhonchi or rales.  Abdominal:     General: Abdomen is flat. Bowel sounds are normal. There is no distension.     Palpations: Abdomen is soft. There is no mass.     Tenderness: There is no abdominal tenderness. There is no guarding or rebound.     Hernia: No hernia is present.  Musculoskeletal:        General: Normal range of motion.     Cervical back: Normal range of motion and neck supple.     Right lower leg: Edema (tr)  present.     Left lower leg: Edema (tr) present.     Comments: R arm/hand spasticity and weakness   Lymphadenopathy:     Cervical: No cervical adenopathy.  Skin:    General: Skin is warm and dry.     Findings: No rash.  Neurological:     General: No focal deficit present.     Mental Status: He is alert and oriented to person, place, and time.     Comments: CN grossly intact, station and gait intact  Psychiatric:        Mood and Affect: Mood and affect and mood normal.        Behavior: Behavior normal.        Thought Content: Thought content normal.        Judgment: Judgment normal.       Results for orders placed or performed in visit on 04/08/20  CBC with Differential/Platelet  Result Value Ref Range   WBC 7.3 4.0 - 10.5 K/uL   RBC 4.72 4.22 - 5.81 Mil/uL   Hemoglobin 14.6 13.0 - 17.0 g/dL   HCT 41.9 39.0 - 52.0 %   MCV 88.8 78.0 - 100.0 fl   MCHC 34.9 30.0 - 36.0 g/dL   RDW 13.2 11.5 - 15.5 %   Platelets 179.0 150.0 - 400.0 K/uL   Neutrophils Relative % 67.1 43.0 - 77.0 %   Lymphocytes Relative 23.4 12.0 - 46.0 %   Monocytes Relative 7.0 3.0 - 12.0 %   Eosinophils Relative 1.8 0.0 - 5.0 %   Basophils Relative 0.7 0.0 - 3.0 %   Neutro Abs 4.9 1.4 - 7.7 K/uL   Lymphs Abs 1.7 0.7 - 4.0 K/uL   Monocytes Absolute 0.5 0.1 - 1.0 K/uL   Eosinophils Absolute 0.1 0.0 - 0.7 K/uL   Basophils Absolute 0.0 0.0 - 0.1 K/uL  Hepatitis C antibody  Result Value Ref Range   Hepatitis C Ab NON-REACTIVE NON-REACTI   SIGNAL  TO CUT-OFF 0.01 <1.00  PSA  Result Value Ref Range   PSA 2.53 0.10 - 4.00 ng/mL  Uric acid  Result Value Ref Range   Uric Acid, Serum 5.9 4.0 - 7.8 mg/dL  Hemoglobin A1c  Result Value Ref Range   Hgb A1c MFr Bld 7.9 (H) 4.6 - 6.5 %  Comprehensive metabolic panel  Result Value Ref Range   Sodium 141 135 - 145 mEq/L   Potassium 3.6 3.5 - 5.1 mEq/L   Chloride 102 96 - 112 mEq/L   CO2 33 (H) 19 - 32 mEq/L   Glucose, Bld 91 70 - 99 mg/dL   BUN 17 6 - 23 mg/dL    Creatinine, Ser 1.00 0.40 - 1.50 mg/dL   Total Bilirubin 1.0 0.2 - 1.2 mg/dL   Alkaline Phosphatase 94 39 - 117 U/L   AST 17 0 - 37 U/L   ALT 23 0 - 53 U/L   Total Protein 6.6 6.0 - 8.3 g/dL   Albumin 4.1 3.5 - 5.2 g/dL   GFR 72.74 >60.00 mL/min   Calcium 9.9 8.4 - 10.5 mg/dL  Lipid panel  Result Value Ref Range   Cholesterol 153 0 - 200 mg/dL   Triglycerides 179.0 (H) 0.0 - 149.0 mg/dL   HDL 35.80 (L) >39.00 mg/dL   VLDL 35.8 0.0 - 40.0 mg/dL   LDL Cholesterol 82 0 - 99 mg/dL   Total CHOL/HDL Ratio 4    NonHDL 117.40    Assessment & Plan:  This visit occurred during the SARS-CoV-2 public health emergency.  Safety protocols were in place, including screening questions prior to the visit, additional usage of staff PPE, and extensive cleaning of exam room while observing appropriate contact time as indicated for disinfecting solutions.   Problem List Items Addressed This Visit    Type 2 diabetes, controlled, with neuropathy ([place])    Appreciate endo care - jardiance recent start.       Impaired mobility    They will check with insurance for coverage of specific power wheelchair of interest and return to me for mobility assessment if needed.       Hypertension associated with diabetes ([place])    Chronic, stable on current regimen - continue.       Relevant Medications   furosemide (LASIX) 20 MG tablet   Hyperlipidemia associated with type 2 diabetes mellitus (HCC)    Chronic, stable on high dose atorvastatin. Continue. The 10-year ASCVD risk score Mikey Bussing DC Brooke Bonito., et al., 2013) is: 56.1%   Values used to calculate the score:     Age: 60 years     Sex: Male     Is Non-Hispanic African American: No     Diabetic: Yes     Tobacco smoker: No     Systolic Blood Pressure: 810 mmHg     Is BP treated: Yes     HDL Cholesterol: 35.8 mg/dL     Total Cholesterol: 153 mg/dL       Hemiparesis affecting right side as late effect of cerebrovascular accident [facility])    Managed with  baclofen.      Health maintenance examination - Primary    Preventative protocols reviewed and updated unless pt declined. Discussed healthy diet and lifestyle.       Gout    No recent gout flares.       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       Chronic diastolic heart  failure, NYHA class 1 (HCC)    Continue lasix, jardiance.       Relevant Medications   furosemide (LASIX) 20 MG tablet   Advanced care planning/counseling discussion    Advanced directive discussion - HCPOA would be daughter Alyse Low. Has advanced directive at home. Asked to bring [place] a copy.           No orders of the defined types were placed in this encounter.  No orders of the defined types were placed in this encounter.   Patient instructions: Check on living will at home and bring [place] a copy to update your chart.  Check with insurance on preferred medical equipment provider for power wheelchair. Return for mobility assessment.  Good to see you today.  You are doing well today continue current medicines Return as needed for mobility assessment. Return in 6 months for follow up visit.   Follow up plan: Return in about 6 months (around 10/13/2020) for follow up visit.  [name], MD

## 2020-04-15 NOTE — Patient Instructions (Addendum)
Check on living will at home and bring [place] a copy to update your chart.  Check with insurance on preferred medical equipment provider for power wheelchair. Return for mobility assessment.  Good to see you today.  You are doing well today continue current medicines Return as needed for mobility assessment. Return in 6 months for follow up visit.   Health Maintenance After Age 78 After age 73, you are at a higher risk for certain long-term diseases and infections as well as injuries from falls. Falls are a major cause of broken bones and head injuries in people who are older than age 84. Getting regular preventive care can help to keep you healthy and well. Preventive care includes getting regular testing and making lifestyle changes as recommended by your health care provider. Talk with your health care provider about:  Which screenings and tests you should have. A screening is a test that checks for a disease when you have no symptoms.  A diet and exercise plan that is right for you. What should I know about screenings and tests to prevent falls? Screening and testing are the best ways to find a health problem early. Early diagnosis and treatment give you the best chance of managing medical conditions that are common after age 68. Certain conditions and lifestyle choices may make you more likely to have a fall. Your health care provider may recommend:  Regular vision checks. Poor vision and conditions such as cataracts can make you more likely to have a fall. If you wear glasses, make sure to get your prescription updated if your vision changes.  Medicine review. Work with your health care provider to regularly review all of the medicines you are taking, including over-the-counter medicines. Ask your health care provider about any side effects that may make you more likely to have a fall. Tell your health care provider if any medicines that you take make you feel dizzy or sleepy.  Osteoporosis  screening. Osteoporosis is a condition that causes the bones to get weaker. This can make the bones weak and cause them to break more easily.  Blood pressure screening. Blood pressure changes and medicines to control blood pressure can make you feel dizzy.  Strength and balance checks. Your health care provider may recommend certain tests to check your strength and balance while standing, walking, or changing positions.  Foot health exam. Foot pain and numbness, as well as not wearing proper footwear, can make you more likely to have a fall.  Depression screening. You may be more likely to have a fall if you have a fear of falling, feel emotionally low, or feel unable to do activities that you used to do.  Alcohol use screening. Using too much alcohol can affect your balance and may make you more likely to have a fall. What actions can I take to lower my risk of falls? General instructions  Talk with your health care provider about your risks for falling. Tell your health care provider if: ? You fall. Be sure to tell your health care provider about all falls, even ones that seem minor. ? You feel dizzy, sleepy, or off-balance.  Take over-the-counter and prescription medicines only as told by your health care provider. These include any supplements.  Eat a healthy diet and maintain a healthy weight. A healthy diet includes low-fat dairy products, low-fat (lean) meats, and fiber from whole grains, beans, and lots of fruits and vegetables. Home safety  Remove any tripping hazards, such as rugs,  cords, and clutter.  Install safety equipment such as grab bars in bathrooms and safety rails on stairs.  Keep rooms and walkways well-lit. Activity  Follow a regular exercise program to stay fit. This will help you maintain your balance. Ask your health care provider what types of exercise are appropriate for you.  If you need a cane or walker, use it as recommended by your health care  provider.  Wear supportive shoes that have nonskid soles.   Lifestyle  Do not drink alcohol if your health care provider tells you not to drink.  If you drink alcohol, limit how much you have: ? 0-1 drink a day for women. ? 0-2 drinks a day for men.  Be aware of how much alcohol is in your drink. In the [place] one drink equals one typical bottle of beer (12 oz), one-half glass of wine (5 oz), or one shot of hard liquor (1 oz).  Do not use any products that contain nicotine or tobacco, such as cigarettes and e-cigarettes. If you need help quitting, ask your health care provider. Summary  Having a healthy lifestyle and getting preventive care can help to protect your health and wellness after age 42.  Screening and testing are the best way to find a health problem early and help you avoid having a fall. Early diagnosis and treatment give you the best chance for managing medical conditions that are more common for people who are older than age 53.  Falls are a major cause of broken bones and head injuries in people who are older than age 34. Take precautions to prevent a fall at home.  Work with your health care provider to learn what changes you can make to improve your health and wellness and to prevent falls. This information is not intended to replace advice given to you by your health care provider. Make sure you discuss any questions you have with your health care provider. Document Revised: 06/14/2018 Document Reviewed: 01/04/2017 Elsevier Patient Education  2021 [employer].

## 2020-04-15 NOTE — Assessment & Plan Note (Addendum)
Advanced directive discussion - HCPOA would be daughter Alyse Low. Has advanced directive at home. Asked to bring [place] a copy.

## 2020-04-15 NOTE — Assessment & Plan Note (Signed)
Preventative protocols reviewed and updated unless pt declined. Discussed healthy diet and lifestyle.  

## 2020-04-17 DIAGNOSIS — Z7409 Other reduced mobility: Secondary | ICD-10-CM | POA: Insufficient documentation

## 2020-04-17 NOTE — Assessment & Plan Note (Signed)
Continues low dose sertraline with benefit.

## 2020-04-17 NOTE — Assessment & Plan Note (Signed)
Chronic, stable on high dose atorvastatin. Continue. The 10-year ASCVD risk score Mikey Bussing DC Brooke Bonito., et al., 2013) is: 56.1%   Values used to calculate the score:     Age: 80 years     Sex: Male     Is Non-Hispanic African American: No     Diabetic: Yes     Tobacco smoker: No     Systolic Blood Pressure: 253 mmHg     Is BP treated: Yes     HDL Cholesterol: 35.8 mg/dL     Total Cholesterol: 153 mg/dL

## 2020-04-17 NOTE — Assessment & Plan Note (Signed)
Appreciate endo care - jardiance recent start.

## 2020-04-17 NOTE — Assessment & Plan Note (Signed)
Managed with baclofen.

## 2020-04-17 NOTE — Assessment & Plan Note (Signed)
Continue lasix, jardiance.

## 2020-04-17 NOTE — Assessment & Plan Note (Addendum)
They will check with insurance for coverage of specific power wheelchair of interest and return to me for mobility assessment if needed.

## 2020-04-17 NOTE — Assessment & Plan Note (Signed)
Chronic, stable on current regimen - continue. 

## 2020-04-17 NOTE — Assessment & Plan Note (Signed)
No recent gout flares.  

## 2020-04-20 DIAGNOSIS — E785 Hyperlipidemia, unspecified: Secondary | ICD-10-CM | POA: Diagnosis not present

## 2020-04-20 DIAGNOSIS — E1142 Type 2 diabetes mellitus with diabetic polyneuropathy: Secondary | ICD-10-CM | POA: Diagnosis not present

## 2020-04-20 DIAGNOSIS — Z794 Long term (current) use of insulin: Secondary | ICD-10-CM | POA: Diagnosis not present

## 2020-04-20 DIAGNOSIS — E11319 Type 2 diabetes mellitus with unspecified diabetic retinopathy without macular edema: Secondary | ICD-10-CM | POA: Diagnosis not present

## 2020-04-20 DIAGNOSIS — Z8673 Personal history of transient ischemic attack (TIA), and cerebral infarction without residual deficits: Secondary | ICD-10-CM | POA: Diagnosis not present

## 2020-04-20 DIAGNOSIS — I10 Essential (primary) hypertension: Secondary | ICD-10-CM | POA: Diagnosis not present

## 2020-04-21 ENCOUNTER — Encounter: Payer: Self-pay | Admitting: Podiatry

## 2020-04-21 ENCOUNTER — Other Ambulatory Visit (HOSPITAL_COMMUNITY): Payer: Self-pay | Admitting: Internal Medicine

## 2020-04-21 ENCOUNTER — Ambulatory Visit: Payer: PPO | Admitting: Podiatry

## 2020-04-21 ENCOUNTER — Ambulatory Visit: Payer: PPO | Attending: Internal Medicine

## 2020-04-21 ENCOUNTER — Other Ambulatory Visit: Payer: Self-pay

## 2020-04-21 DIAGNOSIS — M79675 Pain in left toe(s): Secondary | ICD-10-CM

## 2020-04-21 DIAGNOSIS — Z23 Encounter for immunization: Secondary | ICD-10-CM

## 2020-04-21 DIAGNOSIS — B351 Tinea unguium: Secondary | ICD-10-CM

## 2020-04-21 DIAGNOSIS — M79674 Pain in right toe(s): Secondary | ICD-10-CM | POA: Diagnosis not present

## 2020-04-21 DIAGNOSIS — E1142 Type 2 diabetes mellitus with diabetic polyneuropathy: Secondary | ICD-10-CM | POA: Diagnosis not present

## 2020-04-21 NOTE — Progress Notes (Signed)
   Covid-19 Vaccination Clinic  Name:  Jeffrey [name]    MRN: [id number] DOB: 02-08-1941  04/21/2020  Jeffrey [name] was observed post Covid-19 immunization for 15 minutes without incident. He was provided with Vaccine Information Sheet and instruction to access the V-Safe system.   Jeffrey [name] was instructed to call 911 with any severe reactions post vaccine: [name] Difficulty breathing  . Swelling of face and throat  . A fast heartbeat  . A bad rash all over body  . Dizziness and weakness   Immunizations Administered    Name Date Dose VIS Date Route   PFIZER Comrnaty(Gray TOP) Covid-19 Vaccine 04/21/2020  1:21 PM 0.3 mL 02/13/2020 Intramuscular   Manufacturer: [employer]   Lot: [id number]   NDC: [phone]

## 2020-04-26 NOTE — Progress Notes (Signed)
Subjective: Jeffrey [name] is a pleasant 80 y.o. male patient seen today at risk foot care with history of diabetic neuropathy and painful mycotic nails b/l that are difficult to trim. Pain interferes with ambulation. Aggravating factors include wearing enclosed shoe gear. Pain is relieved with periodic professional debridement.   His daughter is present during today's visit. They voice no new concerns on today's visit.  Allergies  Allergen Reactions  . Niacin Other (See Comments)    REACTION: intolerant,not allergic="flushing,hot flashes,turning red" REACTION: intolerant,not allergic="flushing,hot flashes,turning red" Other reaction(s): flushed skin/rash   Objective: Physical Exam  General: Jeffrey [name] is a pleasant 80 y.o. Caucasian male, in NAD. AAO x 3.   Vascular:  Capillary fill time to digits <3 seconds b/l lower extremities. Palpable DP pulses b/l. Palpable PT pulses b/l. Pedal hair absent b/l. Skin temperature gradient within normal limits b/l. Trace edema noted b/l feet.  Dermatological:  Pedal skin with normal turgor, texture and tone bilaterally. No open wounds bilaterally. Toenails 1-5 b/l elongated, discolored, dystrophic, thickened, crumbly with subungual debris and tenderness to dorsal palpation.  Musculoskeletal:  Normal muscle strength 5/5 to all lower extremity muscle groups bilaterally. No pain crepitus or joint limitation noted with ROM b/l. No gross bony deformities bilaterally.  Neurological:  Protective sensation diminished with 10g monofilament b/l.  Assessment and Plan:  1. Pain due to onychomycosis of toenails of both feet   2. DM type 2 with diabetic peripheral neuropathy ([place])    -Examined patient. -No new findings. No new orders. -Continue diabetic foot care principles. -Toenails 1-5 b/l were debrided in length and girth with sterile nail nippers and dremel without iatrogenic bleeding.  -Patient to report any pedal injuries to medical  professional immediately. -Patient/POA to call should there be question/concern in the interim.  Return in about 3 months (around 07/19/2020).  Marzetta Board, DPM

## 2020-04-27 ENCOUNTER — Telehealth: Payer: Self-pay | Admitting: *Deleted

## 2020-04-27 DIAGNOSIS — Z7189 Other specified counseling: Secondary | ICD-10-CM

## 2020-04-27 NOTE — Telephone Encounter (Signed)
Patient's daughter Jeffrey [name] called stating that she wanted to let Dr. Danise Mina know that she talked with her dad's insurance company about a motorized scooter. Jeffrey [name] stated that she was told that they use [facility] in [place] at [address] for scooters.  Jeffrey [name] also stated that her dad has decided to stop the DNR order that he has on file and she wants to know what has to be done to discontinue that order.

## 2020-04-28 DIAGNOSIS — E113293 Type 2 diabetes mellitus with mild nonproliferative diabetic retinopathy without macular edema, bilateral: Secondary | ICD-10-CM | POA: Diagnosis not present

## 2020-04-28 DIAGNOSIS — H52203 Unspecified astigmatism, bilateral: Secondary | ICD-10-CM | POA: Diagnosis not present

## 2020-04-28 DIAGNOSIS — Z961 Presence of intraocular lens: Secondary | ICD-10-CM | POA: Diagnosis not present

## 2020-04-29 ENCOUNTER — Encounter: Payer: Self-pay | Admitting: Family Medicine

## 2020-04-29 DIAGNOSIS — L720 Epidermal cyst: Secondary | ICD-10-CM | POA: Diagnosis not present

## 2020-04-30 NOTE — Telephone Encounter (Signed)
I'm surprised as often insurance won't cover scooter and only motorized wheelchair.  Would have her call [facility] and ask them to fax [place] forms they use for power mobility device Rx, then schedule mobility assessment OV.  As far as stopping DNR order, would have then discard [place] DNR form at home and we will remove DNR order from problem list in chart. Any specific concerns about DNR? We can further discuss this at next OV.  Will also forward to [facility] as I don't know how to change this in [place] or use [facility].

## 2020-05-01 NOTE — Telephone Encounter (Signed)
Lvm asking pt's daughter, [name] (on dpr), to call back.  Need to relay Dr. Synthia Innocent message.

## 2020-05-01 NOTE — Telephone Encounter (Signed)
I am working on the discontinued DNR to have removed from [place].

## 2020-05-04 NOTE — Telephone Encounter (Addendum)
Pt's daughter, [name], returning call.  I relayed Dr. Synthia Innocent message.  Pt verbalizes understanding.  Says she spoke [place] and was told they just needed an order rx.  Scheduled mobility OV on 05/13/20 at 11:00.  I called [employer] and spoke with Sharee Pimple asking if they had a form they could fax for the motorized scooter.  She states they are not taking any new orders for scooters right now.  But she provided the following locations that might be able to help:  -[employer]- [place] [phone]) -[place] [phone])

## 2020-05-04 NOTE — Telephone Encounter (Signed)
Spoke with [place] her of message from [employer].  She verbalizes understanding and will check with [employer] to see if they have a form they can fax [place] at [phone].  Also, she will double check with pt's insurance to see if they will work with [employer].

## 2020-05-07 ENCOUNTER — Telehealth: Payer: Self-pay

## 2020-05-07 NOTE — Chronic Care Management (AMB) (Addendum)
Chronic Care Management Pharmacy Assistant   Name: Jeffrey [name]  MRN: [id number] DOB: 10/29/1940  Reason for Encounter: Schedule Follow Up CCM Visit  Patient Questions:  1.  Has patient seen any other providers since last visit? Yes 04/21/20- Dr. Acquanetta [name]- Podiatry 04/20/20- Dr. Hollace [name] 04/15/20- Dr. Danise [name]- PCP- Increased Furosemide from 10-20 mg daily to 20 mg daily and 40 mg on Tues/Thurs 04/03/20- Dr. Hollace [name] 01/09/21- Dr. Acquanetta [name]- Podiatry 12/15/20- Dr. Hollace [name] 12/08/20- Dr. Hollace [name] 12/03/20- Dr. Daryll [name]- orthopedics    2.  Any changes in patient's medicines or health? Yes 04/15/20- Dr. Danise [name]- PCP- Increased Furosemide from 10-20 mg daily to 20 mg daily and 40 mg on Tues/Thurs     PCP : Jeffrey Bush, MD  Allergies:   Allergies  Allergen Reactions   Niacin Other (See Comments)    REACTION: intolerant,not allergic="flushing,hot flashes,turning red" REACTION: intolerant,not allergic="flushing,hot flashes,turning red" Other reaction(s): flushed skin/rash    Medications: Outpatient Encounter Medications as of 05/07/2020  Medication Sig   atorvastatin (LIPITOR) 80 MG tablet Take 80 mg by mouth at bedtime.   baclofen (LIORESAL) 10 MG tablet 1 tablet with food or milk   Cholecalciferol (VITAMIN D3) 5000 UNITS TABS Take 5,000 Units by mouth once a week.   clopidogrel (PLAVIX) 75 MG tablet 1 tablet   doxycycline (VIBRAMYCIN) 100 MG capsule Take 100 mg by mouth 2 (two) times daily.   empagliflozin (JARDIANCE) 25 MG TABS tablet 1 tablet   fosinopril ([name]) 40 MG tablet 1 tablet   furosemide (LASIX) 20 MG tablet Take 20mg  by mouth daily, take 40mg  on Tues/Thurs   insulin aspart (NOVOLOG FLEXPEN) 100 UNIT/ML FlexPen Inject 14 Units into the skin in the morning and at bedtime. With meals   insulin glargine (LANTUS SOLOSTAR) 100 UNIT/ML Solostar Pen 28 units   ivermectin (STROMECTOL) 3 MG TABS tablet Take by mouth.    LACTOBACILLUS PO Take 1 capsule by mouth daily. [facility] colon health.  Takes M/W/F   loratadine (CLARITIN) 10 MG tablet 1 tablet   metoprolol tartrate (LOPRESSOR) 25 MG tablet 1 tablet   permethrin (ELIMITE) 5 % cream Apply topically.   sertraline (ZOLOFT) 25 MG tablet 1 tablet   triamcinolone (KENALOG) 0.1 % SMARTSIG:1 Application Topical 2-3 Times Daily   Wound Dressings ([facility] HONEY WOUND) GEL See admin instructions.   No facility-administered encounter medications on file as of 05/07/2020.    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 Spoke with patients daughter, Jeffrey [name]. Patient's last visit with Jeffrey [name] was 08/22/19. Follow up appointment was scheduled for 07/07/20 at 11:00.   Are you having any problems with your medications? No  Any concerns patient would like to  discuss with the pharmacist? Not at this time  Jeffrey [name] was reminded to have all of Jeffrey [name]' medications, supplements, any blood glucose and blood pressure readings available for review with Jeffrey [name], Pharm. D, at his telephone visit on 07/07/20 .   Follow-Up:  Pharmacist Review and Scheduled Follow-Up With Clinical Pharmacist  Jeffrey [name], CPP notified  Jeffrey [name], [place] Pharmacy Assistant [phone]

## 2020-05-11 NOTE — Telephone Encounter (Addendum)
Dr. Danise Mina,  Patient is coming in to see you on Wednesday 05/13/20.  In review of process for discontinuing the DNR, we need for you to have a conversation (documented in his 3/922 note) directly with patient regarding this request.  Even though daughter is POA, I do not see where patient has been deemed incompetent so the request really needs to be from Jeffrey [name] and not from the daughter for [place] to make the d/c official in our system.  If you will just let me know once this conversation with him has been had and documented that this is his wish, I can then remove the directive from [facility].   Thanks and let me know if you have any questions.

## 2020-05-13 ENCOUNTER — Other Ambulatory Visit: Payer: Self-pay

## 2020-05-13 ENCOUNTER — Ambulatory Visit (INDEPENDENT_AMBULATORY_CARE_PROVIDER_SITE_OTHER): Payer: PPO | Admitting: Family Medicine

## 2020-05-13 ENCOUNTER — Encounter: Payer: Self-pay | Admitting: Family Medicine

## 2020-05-13 VITALS — BP 140/66 | HR 55 | Temp 97.7°F | Ht 67.0 in | Wt 171.1 lb

## 2020-05-13 DIAGNOSIS — F0631 Mood disorder due to known physiological condition with depressive features: Secondary | ICD-10-CM

## 2020-05-13 DIAGNOSIS — Z7409 Other reduced mobility: Secondary | ICD-10-CM

## 2020-05-13 DIAGNOSIS — I6389 Other cerebral infarction: Secondary | ICD-10-CM | POA: Diagnosis not present

## 2020-05-13 DIAGNOSIS — Z7189 Other specified counseling: Secondary | ICD-10-CM

## 2020-05-13 DIAGNOSIS — I69398 Other sequelae of cerebral infarction: Secondary | ICD-10-CM

## 2020-05-13 DIAGNOSIS — I69351 Hemiplegia and hemiparesis following cerebral infarction affecting right dominant side: Secondary | ICD-10-CM | POA: Diagnosis not present

## 2020-05-13 MED ORDER — POTASSIUM 99 MG PO TABS
1.0000 | ORAL_TABLET | Freq: Every day | ORAL | Status: AC | PRN
Start: 2020-05-13 — End: ?

## 2020-05-13 NOTE — Progress Notes (Signed)
Patient ID: Jeffrey [name], male    DOB: 07/01/1940, 80 y.o.   MRN: [id number]  This visit was conducted in person.  BP 140/66   Pulse (!) 55   Temp 97.7 F (36.5 C) (Temporal)   Ht 5\' 7"  (1.702 m)   Wt 171 lb 2 oz (77.6 kg)   SpO2 94%   BMI 26.80 kg/m    CC: mobility assessment  Subjective:   HPI: Jeffrey [name] is a 80 y.o. male presenting on 05/13/2020 for Mobility Assessement (Pt accompanied by daughter, [name]- temp 97.7.)   Advanced directive discussion - pt desires to discontinue DNR order - states he would want attempt at compression/shock/intubation. Wants to leave decision up to family. Doesn't want to live on life support. He has spoken with family about this.   Increased pedal edema - recently started jardiance 25mg  by endo. Also on lasix 20mg  daily with 2nd dose 3d/wk   Hemorrhagic L thalamus stroke 05/2014 with residual R hemiparesis (loss of strength and sensation to entire RUE and RLE), dysmetria of RUE and RLE, diplopia. Right handed.[facility] with R homonymous hemianopsia s/p CVA.  Here for mobility assessment.  Mobility device requested predominantly for at home use. Ambulation limited by chronic R hemiparesis post-stroke.  Mobility limitation isn't sufficiently or safely resolved by using a cane or walker: due to chronic R hemiparesis Lacks the hand/arm strength to operate a manual wheelchair: due to chronic R hemiparesis He can safely transfer to and from mobility device. He cannot operate tiller steering system due to R arm/hand dysmetria. He can maintain postural stability and position while operating mobility device at home.  He has sufficient mental and physical ability to safely operate mobility device at home.  Home allows adequate access between rooms, maneuvering space and surfaces: needs large wheels to use in his yard which will help mental health.  Using mobility device will significantly improve ability  to participate in MRADLs, and he will use it at home. He hasn't expressed unwillingness to use this at home.  He would use power mobility device to get to bathroom, to get to kitchen, to prepare meals.      Relevant past medical, surgical, family and social history reviewed and updated as indicated. Interim medical history since our last visit reviewed. Allergies and medications reviewed and updated. Outpatient Medications Prior to Visit  Medication Sig Dispense Refill  . atorvastatin (LIPITOR) 80 MG tablet Take 80 mg by mouth at bedtime.    . baclofen (LIORESAL) 10 MG tablet 1 tablet with food or milk    . Cholecalciferol (VITAMIN D3) 5000 UNITS TABS Take 5,000 Units by mouth once a week.    . clopidogrel (PLAVIX) 75 MG tablet 1 tablet    . doxycycline (VIBRAMYCIN) 100 MG capsule Take 100 mg by mouth 2 (two) times daily.    . empagliflozin (JARDIANCE) 25 MG TABS tablet 1 tablet    . fosinopril ([name]) 40 MG tablet 1 tablet    . insulin aspart (NOVOLOG FLEXPEN) 100 UNIT/ML FlexPen Inject 10 Units into the skin in the morning and at bedtime. With meals    . insulin glargine (LANTUS SOLOSTAR) 100 UNIT/ML Solostar Pen 28 units    . LACTOBACILLUS PO Take 1 capsule by mouth daily. [facility] colon health.  Takes M/W/F    . loratadine (CLARITIN) 10 MG tablet 1 tablet    . metoprolol tartrate (LOPRESSOR) 25 MG tablet 1 tablet    . sertraline (ZOLOFT)  25 MG tablet 1 tablet    . triamcinolone (KENALOG) 0.1 % SMARTSIG:1 Application Topical 2-3 Times Daily    . furosemide (LASIX) 20 MG tablet Take 20mg  by mouth daily, take 40mg  on Tues/Thurs    . furosemide (LASIX) 20 MG tablet Take 20mg  by mouth daily, take 40mg  on Tues/Thurs/Sat    . ivermectin (STROMECTOL) 3 MG TABS tablet Take by mouth.    . permethrin (ELIMITE) 5 % cream Apply topically.    . Wound Dressings ([facility] HONEY WOUND) GEL See admin instructions.     No facility-administered medications prior to visit.     Per HPI unless  specifically indicated in ROS section below Review of Systems Objective:  BP 140/66   Pulse (!) 55   Temp 97.7 F (36.5 C) (Temporal)   Ht 5\' 7"  (1.702 m)   Wt 171 lb 2 oz (77.6 kg)   SpO2 94%   BMI 26.80 kg/m   Wt Readings from Last 3 Encounters:  05/13/20 171 lb 2 oz (77.6 kg)  04/15/20 171 lb 1 oz (77.6 kg)  10/02/19 175 lb 12.8 oz (79.7 kg)      Physical Exam Vitals and nursing note reviewed.  Constitutional:      Appearance: Normal appearance. He is not ill-appearing.     Comments: Sitting in wheelchair   Cardiovascular:     Rate and Rhythm: Normal rate and regular rhythm.     Pulses: Normal pulses.     Heart sounds: Normal heart sounds. No murmur heard.   Pulmonary:     Effort: Pulmonary effort is normal. No respiratory distress.     Breath sounds: Normal breath sounds. No wheezing, rhonchi or rales.  Musculoskeletal:     Right lower leg: No edema.     Left lower leg: No edema.  Skin:    General: Skin is warm and dry.     Findings: No rash.  Neurological:     Mental Status: He is alert.     Comments:  Chronic R spastic hemiparesis  Grip strength intact, motor strength relatively preserved on right (4/5) but with significant dysmetria of RUE and RLE Diminished sensation to entire R side        Results for orders placed or performed in visit on 04/08/20  CBC with Differential/Platelet  Result Value Ref Range   WBC 7.3 4.0 - 10.5 K/uL   RBC 4.72 4.22 - 5.81 Mil/uL   Hemoglobin 14.6 13.0 - 17.0 g/dL   HCT 41.9 39.0 - 52.0 %   MCV 88.8 78.0 - 100.0 fl   MCHC 34.9 30.0 - 36.0 g/dL   RDW 13.2 11.5 - 15.5 %   Platelets 179.0 150.0 - 400.0 K/uL   Neutrophils Relative % 67.1 43.0 - 77.0 %   Lymphocytes Relative 23.4 12.0 - 46.0 %   Monocytes Relative 7.0 3.0 - 12.0 %   Eosinophils Relative 1.8 0.0 - 5.0 %   Basophils Relative 0.7 0.0 - 3.0 %   Neutro Abs 4.9 1.4 - 7.7 K/uL   Lymphs Abs 1.7 0.7 - 4.0 K/uL   Monocytes Absolute 0.5 0.1 - 1.0 K/uL    Eosinophils Absolute 0.1 0.0 - 0.7 K/uL   Basophils Absolute 0.0 0.0 - 0.1 K/uL  Hepatitis C antibody  Result Value Ref Range   Hepatitis C Ab NON-REACTIVE NON-REACTI   SIGNAL TO CUT-OFF 0.01 <1.00  PSA  Result Value Ref Range   PSA 2.53 0.10 - 4.00 ng/mL  Uric acid  Result  Value Ref Range   Uric Acid, Serum 5.9 4.0 - 7.8 mg/dL  Hemoglobin A1c  Result Value Ref Range   Hgb A1c MFr Bld 7.9 (H) 4.6 - 6.5 %  Comprehensive metabolic panel  Result Value Ref Range   Sodium 141 135 - 145 mEq/L   Potassium 3.6 3.5 - 5.1 mEq/L   Chloride 102 96 - 112 mEq/L   CO2 33 (H) 19 - 32 mEq/L   Glucose, Bld 91 70 - 99 mg/dL   BUN 17 6 - 23 mg/dL   Creatinine, Ser 1.00 0.40 - 1.50 mg/dL   Total Bilirubin 1.0 0.2 - 1.2 mg/dL   Alkaline Phosphatase 94 39 - 117 U/L   AST 17 0 - 37 U/L   ALT 23 0 - 53 U/L   Total Protein 6.6 6.0 - 8.3 g/dL   Albumin 4.1 3.5 - 5.2 g/dL   GFR 72.74 >60.00 mL/min   Calcium 9.9 8.4 - 10.5 mg/dL  Lipid panel  Result Value Ref Range   Cholesterol 153 0 - 200 mg/dL   Triglycerides 179.0 (H) 0.0 - 149.0 mg/dL   HDL 35.80 (L) >39.00 mg/dL   VLDL 35.8 0.0 - 40.0 mg/dL   LDL Cholesterol 82 0 - 99 mg/dL   Total CHOL/HDL Ratio 4    NonHDL 117.40    Assessment & Plan:  This visit occurred during the SARS-CoV-2 public health emergency.  Safety protocols were in place, including screening questions prior to the visit, additional usage of staff PPE, and extensive cleaning of exam room while observing appropriate contact time as indicated for disinfecting solutions.   Problem List Items Addressed This Visit    Hemiparesis affecting right side as late effect of cerebrovascular accident [facility])   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   Anterior cerebral circulation hemorrhagic infarction [facility])   Advanced care planning/counseling discussion    Advanced directive discussion - pt desires to discontinue DNR order - states he would want attempt at compression/shock/intubation. Wants to leave  decision up to family. Doesn't want to live on life support. He has spoken with family about this.       Impaired mobility - Primary    Here for mobility assessment. They bring forms from [employer] which were reviewed. He will benefit from power mobility device, will need power wheelchair given unable to use tiller steering wheel. Will send request. He may need PT home evaluation per forms, but will see if qualifies without PT. If this is also needed, I will order.           Meds ordered this encounter  Medications  . Potassium 99 MG TABS    Sig: Take 1 tablet (99 mg total) by mouth daily as needed (when you take 2 lasix pills).   No orders of the defined types were placed in this encounter.   Patient Instructions  Increase lasix to 20mg  daily, three times weekly take 40mg .  Start potassium over the counter (99mg ) on days you take 2 lasix pills.  We will refer you to physical therapy for evaluation for mobility.  We will send information to insurance.   Follow up plan: Return if symptoms worsen or fail to improve.  [name], MD

## 2020-05-13 NOTE — Patient Instructions (Signed)
Increase lasix to 20mg  daily, three times weekly take 40mg .  Start potassium over the counter (99mg ) on days you take 2 lasix pills.  We will refer you to physical therapy for evaluation for mobility.  We will send information to insurance.

## 2020-05-15 DIAGNOSIS — I10 Essential (primary) hypertension: Secondary | ICD-10-CM | POA: Diagnosis not present

## 2020-05-15 DIAGNOSIS — Z794 Long term (current) use of insulin: Secondary | ICD-10-CM | POA: Diagnosis not present

## 2020-05-15 DIAGNOSIS — Z8673 Personal history of transient ischemic attack (TIA), and cerebral infarction without residual deficits: Secondary | ICD-10-CM | POA: Diagnosis not present

## 2020-05-15 DIAGNOSIS — E1165 Type 2 diabetes mellitus with hyperglycemia: Secondary | ICD-10-CM | POA: Diagnosis not present

## 2020-05-15 DIAGNOSIS — E11319 Type 2 diabetes mellitus with unspecified diabetic retinopathy without macular edema: Secondary | ICD-10-CM | POA: Diagnosis not present

## 2020-05-15 DIAGNOSIS — E785 Hyperlipidemia, unspecified: Secondary | ICD-10-CM | POA: Diagnosis not present

## 2020-05-17 NOTE — Assessment & Plan Note (Addendum)
Here for mobility assessment. They bring forms from [employer] which were reviewed. He will benefit from power mobility device, will need power wheelchair given unable to use tiller steering wheel. Will send request. PT order signed for home/seating evaluation per forms.

## 2020-05-17 NOTE — Assessment & Plan Note (Addendum)
Advanced directive discussion - pt desires to discontinue DNR order - states he would want attempt at compression/shock/intubation. Wants to leave decision up to family. Doesn't want to live on life support. He has spoken with family about this.

## 2020-07-02 ENCOUNTER — Telehealth: Payer: Self-pay | Admitting: Family Medicine

## 2020-07-02 NOTE — Telephone Encounter (Signed)
The daughter called and wanted to cancel the apt due to she is back from surgery and cant get to him but she wanting to reschedule.

## 2020-07-06 ENCOUNTER — Telehealth: Payer: Self-pay

## 2020-07-06 NOTE — Chronic Care Management (AMB) (Addendum)
    Chronic Care Management Pharmacy Assistant   Name: DARROL BRANDENBURG  MRN: [id number] DOB: August 27, 1940  Reason for Encounter: Reminder Call/Chart Review  Conditions to be addressed/monitored: HTN, HLD and DMII  Recent office visits:  05/13/2020  Dr.Gutierrez PCP - Increase lasix to 20mg  daily and three times weekly take 40mg . Start potassium over the counter (99mg ) on days you take 2 lasix pills.   Recent consult visits:   04/29/2020  Allyn Kenner  Dermatology - No medication changes 04/28/2020  Luberta Mutter  Ophthalmology - No medication changes  Hospital visits:  None in previous 6 months  Medications: Outpatient Encounter Medications as of 07/06/2020  Medication Sig   atorvastatin (LIPITOR) 80 MG tablet Take 80 mg by mouth at bedtime.   baclofen (LIORESAL) 10 MG tablet 1 tablet with food or milk   Cholecalciferol (VITAMIN D3) 5000 UNITS TABS Take 5,000 Units by mouth once a week.   clopidogrel (PLAVIX) 75 MG tablet 1 tablet   COVID-19 mRNA Vac-TriS, Pfizer, SUSP injection USE AS DIRECTED   doxycycline (VIBRAMYCIN) 100 MG capsule Take 100 mg by mouth 2 (two) times daily.   empagliflozin (JARDIANCE) 25 MG TABS tablet 1 tablet   fosinopril ([name]) 40 MG tablet 1 tablet   furosemide (LASIX) 20 MG tablet Take 20mg  by mouth daily, take 40mg  on Tues/Thurs/Sat   insulin aspart (NOVOLOG FLEXPEN) 100 UNIT/ML FlexPen Inject 10 Units into the skin in the morning and at bedtime. With meals   insulin glargine (LANTUS SOLOSTAR) 100 UNIT/ML Solostar Pen 28 units   LACTOBACILLUS PO Take 1 capsule by mouth daily. [facility] colon health.  Takes M/W/F   loratadine (CLARITIN) 10 MG tablet 1 tablet   metoprolol tartrate (LOPRESSOR) 25 MG tablet 1 tablet   Potassium 99 MG TABS Take 1 tablet (99 mg total) by mouth daily as needed (when you take 2 lasix pills).   sertraline (ZOLOFT) 25 MG tablet 1 tablet   triamcinolone (KENALOG) 0.1 % SMARTSIG:1 Application Topical 2-3 Times Daily   No  facility-administered encounter medications on file as of 07/06/2020.    MANAV PIEROTTI was contacted to remind him of his upcoming telephone visit with Debbora Dus on 07/07/2020 at 11:00am  he was reminded to have all medications, supplements and any blood glucose and blood pressure readings available for review at appointment. Left a voicemail regarding appointment tomorrow, 07/07/20 at 11:00 AM.  Star Rating Drugs: Medication:  Last Fill: Day Supply Jardiance 25mg . 06/05/2020 30ds  Debbora Dus, CPP notified  Avel Sensor, [place] Assistant [phone]  I have reviewed the care management and care coordination activities outlined in this encounter and I am certifying that I agree with the content of this note. No further action required.  Debbora Dus, PharmD Clinical Pharmacist [facility] [phone]

## 2020-07-07 ENCOUNTER — Telehealth: Payer: PPO

## 2020-07-07 NOTE — Telephone Encounter (Signed)
Appointment cancelled

## 2020-07-07 NOTE — Progress Notes (Deleted)
Chronic Care Management Pharmacy Note  07/07/2020 Name:  Jeffrey [name] MRN:  [id number] DOB:  [birth year]  Subjective: Jeffrey [name] is an 80 y.o. year old male who is a primary patient of [name], MD.  The CCM team was consulted for assistance with disease management and care coordination needs.    Engaged with patient by telephone for follow up visit in response to provider referral for pharmacy case management and/or care coordination services.   Consent to Services:  The patient was given information about Chronic Care Management services, agreed to services, and gave verbal consent prior to initiation of services.  Please see initial visit note for detailed documentation.   Patient Care Team: [name], MD as PCP - General (Family Medicine) Jerline Pain, MD as PCP - Cardiology (Cardiology) Delia Chimes, NP (Inactive) as Nurse Practitioner (Nurse Practitioner)  Recent office visits: 05/13/2020  Dr.Gutierrez PCP - Increase lasix to 69m daily and three times weekly take 495m Start potassium over the counter (9970mon days you take 2 lasix pills.   Recent consult visits:   04/29/2020  [facility]lyn [facility] - No medication changes 04/28/2020  ChrLuberta Mutterphthalmology - No medication changes  Hospital visits: None in previous 6 months  Objective:  Lab Results  Component Value Date   CREATININE 1.00 04/08/2020   BUN 17 04/08/2020   GFR 72.74 04/08/2020   GFRNONAA >60 09/13/2014   GFRAA >60 09/13/2014   NA 141 04/08/2020   K 3.6 04/08/2020   CALCIUM 9.9 04/08/2020   CO2 33 (H) 04/08/2020   GLUCOSE 91 04/08/2020    Lab Results  Component Value Date/Time   HGBA1C 7.9 (H) 04/08/2020 10:59 AM   HGBA1C 8.1 (A) 08/27/2019 11:44 AM   HGBA1C 9.2 (A) 01/15/2019 02:09 PM   HGBA1C 8.7 (A) 01/30/2018 12:00 AM   HGBA1C 8.7 01/30/2018 12:00 AM   HGBA1C 8.1 07/26/2017 12:00 AM   GFR 72.74 04/08/2020 10:59 AM   GFR 88.67 10/03/2019 12:37 PM    MICROALBUR <0.7 10/31/2017 01:02 PM   MICROALBUR 0.7 07/26/2017 12:00 AM    Last diabetic Eye exam:  Lab Results  Component Value Date/Time   HMDIABEYEEXA Retinopathy (A) 11/30/2018 12:00 AM    Last diabetic Foot exam:  Lab Results  Component Value Date/Time   HMDIABFOOTEX podiatrist 08/31/2017 12:00 AM     Lab Results  Component Value Date   CHOL 153 04/08/2020   HDL 35.80 (L) 04/08/2020   LDLCALC 82 04/08/2020   LDLDIRECT 67.0 10/31/2017   TRIG 179.0 (H) 04/08/2020   CHOLHDL 4 04/08/2020    Hepatic Function Latest Ref Rng & Units 04/08/2020 10/03/2019 01/15/2019  Total Protein 6.0 - 8.3 g/dL 6.6 6.0 6.7  Albumin 3.5 - 5.2 g/dL 4.1 3.7 4.2  AST 0 - 37 U/L _0 ALT 0 - 53 U/L _1 Alk Phosphatase 39 - 117 U/L 94 93 111  Total Bilirubin 0.2 - 1.2 mg/dL 1.0 0.8 0.9    Lab Results  Component Value Date/Time   TSH 1.00 10/03/2019 12:37 PM   TSH 0.87 01/15/2019 03:01 PM    CBC Latest Ref Rng & Units 04/08/2020 10/03/2019 10/31/2017  WBC 4.0 - 10.5 K/uL 7.3 6.5 6.0  Hemoglobin 13.0 - 17.0 g/dL 14.6 13.6 14.9  Hematocrit 39.0 - 52.0 % 41.9 39.3 43.0  Platelets 150.0 - 400.0 K/uL 179.0 150.0 161.0    No results found for: VD25OH  Clinical ASCVD: Yes  The 10-year ASCVD risk score Mikey Bussing DC Brooke Bonito., et al., 2013) is: 53.2%   Values used to calculate the score:     Age: 10 years     Sex: Male     Is Non-Hispanic African American: No     Diabetic: Yes     Tobacco smoker: No     Systolic Blood Pressure: 638 mmHg     Is BP treated: Yes     HDL Cholesterol: 35.8 mg/dL     Total Cholesterol: 153 mg/dL    Depression screen [facility] 2/9 04/08/2020 01/15/2019 10/31/2017  Decreased Interest 0 0 0  Down, Depressed, Hopeless 0 0 0  PHQ - 2 Score 0 0 0  Altered sleeping 0 0 0  Tired, decreased energy 0 1 0  Change in appetite 0 0 0  Feeling bad or failure about yourself  0 0 0  Trouble concentrating 0 1 0  Moving slowly or fidgety/restless 0 0 0  Suicidal thoughts 0 0 0  PHQ-9  Score 0 2 0  Difficult doing work/chores Not difficult at all - Not difficult at all  Some recent data might be hidden    Social History   Tobacco Use  Smoking Status Former Smoker  Smokeless Tobacco Never Used  Tobacco Comment   quit smoking 45 years ago.   BP Readings from Last 3 Encounters:  05/13/20 140/66  04/15/20 134/62  10/02/19 (!) 130/60   Pulse Readings from Last 3 Encounters:  05/13/20 (!) 55  04/15/20 60  10/02/19 59   Wt Readings from Last 3 Encounters:  05/13/20 171 lb 2 oz (77.6 kg)  04/15/20 171 lb 1 oz (77.6 kg)  10/02/19 175 lb 12.8 oz (79.7 kg)   BMI Readings from Last 3 Encounters:  05/13/20 26.80 kg/m  04/15/20 26.79 kg/m  10/02/19 27.13 kg/m    Assessment/Interventions: Review of patient past medical history, allergies, medications, health status, including review of consultants reports, laboratory and other test data, was performed as part of comprehensive evaluation and provision of chronic care management services.   SDOH:  (Social Determinants of Health) assessments and interventions performed: Yes  SDOH Screenings   Alcohol Screen: Low Risk   . Last Alcohol Screening Score (AUDIT): 0  Depression (PHQ2-9): Low Risk   . PHQ-2 Score: 0  Financial Resource Strain: Low Risk   . Difficulty of Paying Living Expenses: Not hard at all  Food Insecurity: No Food Insecurity  . Worried About Charity fundraiser in the Last Year: Never true  . Ran Out of Food in the Last Year: Never true  Housing: Low Risk   . Last Housing Risk Score: 0  Physical Activity: Sufficiently Active  . Days of Exercise per Week: 5 days  . Minutes of Exercise per Session: 30 min  Social Connections: Not on file  Stress: No Stress Concern Present  . Feeling of Stress : Not at all  Tobacco Use: Medium Risk  . Smoking Tobacco Use: Former Smoker  . Smokeless Tobacco Use: Never Used  Transportation Needs: No Transportation Needs  . Lack of Transportation (Medical): No   . Lack of Transportation (Non-Medical): No    CCM Care Plan  Allergies  Allergen Reactions  . Niacin Other (See Comments)    REACTION: intolerant,not allergic="flushing,hot flashes,turning red" REACTION: intolerant,not allergic="flushing,hot flashes,turning red" Other reaction(s): flushed skin/rash    Medications Reviewed Today    Reviewed by [name], MD (Physician) on 05/13/20 at 1117  Med List Status: <None>  Medication Order Taking? Sig Documenting Provider Last Dose Status Informant  atorvastatin (LIPITOR) 80 MG tablet [id number] Yes Take 80 mg by mouth at bedtime. [provider] Taking Active Self  baclofen (LIORESAL) 10 MG tablet [id number] Yes 1 tablet with food or milk [provider] Taking Active   Cholecalciferol (VITAMIN D3) 5000 UNITS TABS [id number] Yes Take 5,000 Units by mouth once a week. [provider] Taking Active   clopidogrel (PLAVIX) 75 MG tablet [id number] Yes 1 tablet [provider] Taking Active   doxycycline (VIBRAMYCIN) 100 MG capsule [id number] Yes Take 100 mg by mouth 2 (two) times daily. [provider] Taking Active   empagliflozin (JARDIANCE) 25 MG TABS tablet [id number] Yes 1 tablet [provider] Taking Active   fosinopril ([name]) 40 MG tablet [id number] Yes 1 tablet [provider] Taking Active   furosemide (LASIX) 20 MG tablet [id number] Yes Take $Remove'20mg'UKghSgg$  by mouth daily, take $RemoveBef'40mg'IFfTTHEOnQ$  on Tues/Thurs [name], MD Taking Active   insulin aspart (NOVOLOG FLEXPEN) 100 UNIT/ML FlexPen [id number] Yes Inject 10 Units into the skin in the morning and at bedtime. With meals [name], MD Taking Active   insulin glargine (LANTUS SOLOSTAR) 100 UNIT/ML Solostar Pen [id number] Yes 28 units [provider] Taking Active   LACTOBACILLUS PO [id number] Yes Take 1 capsule by mouth daily. Philips colon health.  Takes M/W/F [provider] Taking Active Self  loratadine  (CLARITIN) 10 MG tablet [id number] Yes 1 tablet [provider] Taking Active   metoprolol tartrate (LOPRESSOR) 25 MG tablet [id number] Yes 1 tablet [provider] Taking Active   sertraline (ZOLOFT) 25 MG tablet [id number] Yes 1 tablet [provider] Taking Active   triamcinolone (KENALOG) 0.1 % [id number] Yes SMARTSIG:1 Application Topical 2-3 Times Daily [provider] Taking Active           Patient Active Problem List   Diagnosis Date Noted  . Impaired mobility 04/17/2020  . Trigger middle finger of left hand 11/06/2019  . Urinary urgency 08/27/2019  . Health maintenance examination 07/27/2016  . Hypertropia of right eye 03/22/2016  . Medicare annual wellness visit, subsequent 07/23/2015  . Advanced care planning/counseling discussion 07/23/2015  . Diplopia 07/15/2015  . Anterior cerebral circulation hemorrhagic infarction ([place]) 11/24/2014  . Hemiparesis affecting right side as late effect of cerebrovascular accident ([place]) 09/05/2014  . 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 Chronic diastolic heart failure, NYHA class 1 ([place]) 06/05/2014  . Benign paroxysmal positional vertigo 04/28/2014  . Right carotid bruit   . Seasonal allergic rhinitis   . Trigger thumb of left hand 10/12/2011  . Dysphonia 01/04/2011  . Gout   . Adenomatous polyps   . CAD (coronary artery disease)   . Hypertension associated with diabetes ([place])   . Hyperlipidemia associated with type 2 diabetes mellitus ([place])   . Type 2 diabetes, controlled, with neuropathy ([place])   . Ejection fraction     Immunization History  Administered Date(s) Administered  . Fluad Quad(high Dose 65+) 01/01/2019, 01/15/2020  . Influenza Split 02/01/2012, 01/17/2013, 02/04/2014, 12/08/2014, 12/22/2015  . Influenza, High Dose Seasonal PF 01/30/2018  . Influenza,inj,Quad PF,6+ Mos 11/01/2010, 12/22/2015  . Influenza-Unspecified 02/04/2014, 12/08/2014, 12/29/2016  . PFIZER Comirnaty(Gray  Top)Covid-19 Tri-Sucrose Vaccine 04/21/2020  . PFIZER(Purple Top)SARS-COV-2 Vaccination 04/30/2019, 05/21/2019  . Pneumococcal Conjugate-13 02/04/2014  . Pneumococcal Polysaccharide-23 03/07/2004, 10/11/2012  . Td 04/17/2015  . Tdap 05/05/2005  . Zoster 03/08/2011  . Zoster Recombinat (Shingrix) 10/05/2017, 04/07/2018    Conditions to  be addressed/monitored:  Hypertension, Hyperlipidemia, Diabetes, Heart Failure and Depression  There are no care plans that you recently modified to display for this patient.   Current Barriers:  . {pharmacybarriers:24917}  Pharmacist Clinical Goal(s):  [name] Patient will {PHARMACYGOALCHOICES:24921} through collaboration with PharmD and provider.   Interventions: . 1:1 collaboration with [name], MD regarding development and update of comprehensive plan of care as evidenced by provider attestation and co-signature . Inter-disciplinary care team collaboration (see longitudinal plan of care) . Comprehensive medication review performed; medication list updated in electronic medical record  Hypertension (BP goal {CHL HP UPSTREAM Pharmacist BP ranges:[id number]}) -{US controlled/uncontrolled:25276} -Current treatment: . *** -Medications previously tried: ***  -Current home readings: *** -Current dietary habits: *** -Current exercise habits: *** -{ACTIONS;DENIES/REPORTS:21021675::"Denies"} hypotensive/hypertensive symptoms -Educated on {CCM BP Counseling:25124} -Counseled to monitor BP at home ***, document, and provide log at future appointments -{CCMPHARMDINTERVENTION:25122}  Hyperlipidemia: (LDL goal < ***) -{US controlled/uncontrolled:25276} -Current treatment: . *** -Medications previously tried: ***  -Current dietary patterns: *** -Current exercise habits: *** -Educated on {CCM HLD Counseling:25126} -{CCMPHARMDINTERVENTION:25122}  Diabetes (A1c goal {A1c goals:23924}) -{US controlled/uncontrolled:25276} -Current  medications: . *** -Medications previously tried: ***  -Current home glucose readings . fasting glucose: *** . post prandial glucose: *** -{ACTIONS;DENIES/REPORTS:21021675::"Denies"} hypoglycemic/hyperglycemic symptoms -Current meal patterns:  . breakfast: ***  . lunch: ***  . dinner: *** . snacks: *** . drinks: *** -Current exercise: *** -Educated on {CCM DM COUNSELING:25123} -Counseled to check feet daily and get yearly eye exams -{CCMPHARMDINTERVENTION:25122}  Heart Failure (Goal: manage symptoms and prevent exacerbations) -{US controlled/uncontrolled:25276} -Last ejection fraction: *** (Date: ***) -HF type: {type of heart failure:30421350} -NYHA Class: {CHL HP Upstream Pharm NYHA Class:3041172094} -AHA HF Stage: {CHL HP Upstream Pharm AHA HF Stage:[phone]} -Current treatment: . *** -Medications previously tried: ***  -Current home BP/HR readings: *** -Current dietary habits: *** -Current exercise habits: *** -Educated on {CCM HF Counseling:25125} -{CCMPHARMDINTERVENTION:25122}  Depression/Anxiety (Goal: ***) -{US controlled/uncontrolled:25276} -Current treatment: . *** -Medications previously tried/failed: *** -PHQ9: *** -GAD7: *** -Connected with *** for mental health support -Educated on {CCM mental health counseling:25127} -{CCMPHARMDINTERVENTION:25122}  *** (Goal: ***) -{US controlled/uncontrolled:25276} -Current treatment  . *** -Medications previously tried: ***  -{CCMPHARMDINTERVENTION:25122}   Patient Goals/Self-Care Activities . Patient will:  - {pharmacypatientgoals:24919}  Follow Up Plan: {CM FOLLOW UP LEXN:17001}   Medication Assistance: {MEDASSISTANCEINFO:25044}  Star Rating Drugs: Medication:                Last Fill:         Day Supply Jardiance 31m.         06/05/2020          30ds  Patient's preferred pharmacy is:  [id number] [place]:  [phone]: [phone] pill box? {Yes or If no, why not?:20788} Pt endorses ***% compliance  We discussed: {Pharmacy options:24294} Patient decided to: {US Pharmacy PlJQZE:09233}Care Plan and Follow Up Patient Decision:  {FOLLOWUP:24991}  Plan: {CM FOLLOW UP PLAQTM:22633}MiDebbora DusPharmD Clinical Pharmacist [phone]

## 2020-07-15 ENCOUNTER — Telehealth: Payer: Self-pay

## 2020-07-15 NOTE — Chronic Care Management (AMB) (Addendum)
Chronic Care Management Pharmacy Assistant   Name: Jeffrey [name]  MRN: [id number] DOB: 02/12/1941  Reason for Encounter: Disease State HTN DM   Recent office visits:  05/13/2020  Sena Slate, PCP - Increase lasix to 20mg  daily, three times weekly take 40mg .  Start potassium over the counter (99mg ) on days you take 2 lasix pills.  We will refer you to physical therapy for evaluation for mobility.   Recent consult visits:  05/15/2020  Delrae Rend, Endocrinology - Continue current medications   Hospital visits:  None in previous 6 months  Medications: Outpatient Encounter Medications as of 07/15/2020  Medication Sig   atorvastatin (LIPITOR) 80 MG tablet Take 80 mg by mouth at bedtime.   baclofen (LIORESAL) 10 MG tablet 1 tablet with food or milk   Cholecalciferol (VITAMIN D3) 5000 UNITS TABS Take 5,000 Units by mouth once a week.   clopidogrel (PLAVIX) 75 MG tablet 1 tablet   COVID-19 mRNA Vac-TriS, Pfizer, SUSP injection USE AS DIRECTED   doxycycline (VIBRAMYCIN) 100 MG capsule Take 100 mg by mouth 2 (two) times daily.   empagliflozin (JARDIANCE) 25 MG TABS tablet 1 tablet   fosinopril ([name]) 40 MG tablet 1 tablet   furosemide (LASIX) 20 MG tablet Take 20mg  by mouth daily, take 40mg  on Tues/Thurs/Sat   insulin aspart (NOVOLOG FLEXPEN) 100 UNIT/ML FlexPen Inject 10 Units into the skin in the morning and at bedtime. With meals   insulin glargine (LANTUS SOLOSTAR) 100 UNIT/ML Solostar Pen 28 units   LACTOBACILLUS PO Take 1 capsule by mouth daily. Philips colon health.  Takes M/W/F   loratadine (CLARITIN) 10 MG tablet 1 tablet   metoprolol tartrate (LOPRESSOR) 25 MG tablet 1 tablet   Potassium 99 MG TABS Take 1 tablet (99 mg total) by mouth daily as needed (when you take 2 lasix pills).   sertraline (ZOLOFT) 25 MG tablet 1 tablet   triamcinolone (KENALOG) 0.1 % SMARTSIG:1 Application Topical 2-3 Times Daily   No facility-administered encounter medications on file as  of 07/15/2020.    Recent Relevant Labs: Lab Results  Component Value Date/Time   HGBA1C 7.9 (H) 04/08/2020 10:59 AM   HGBA1C 8.1 (A) 08/27/2019 11:44 AM   HGBA1C 9.2 (A) 01/15/2019 02:09 PM   HGBA1C 8.7 (A) 01/30/2018 12:00 AM   HGBA1C 8.7 01/30/2018 12:00 AM   HGBA1C 8.1 07/26/2017 12:00 AM   MICROALBUR <0.7 10/31/2017 01:02 PM   MICROALBUR 0.7 07/26/2017 12:00 AM    Kidney Function Lab Results  Component Value Date/Time   CREATININE 1.00 04/08/2020 10:59 AM   CREATININE 0.84 10/03/2019 12:37 PM   CREATININE 1.24 09/30/2013 12:00 AM   CREATININE 1.24 09/30/2013 12:00 AM   GFR 72.74 04/08/2020 10:59 AM   GFRNONAA >60 09/13/2014 01:08 PM   GFRAA >60 09/13/2014 01:08 PM   Multiple attempts to reach out to the patient 07/14/20, 07/23/20, 07/29/20 were unsuccessful. Chart reviewed. Will try again next month.  Current antihyperglycemic regimen:  Insulin glargine (LANTUS) 100 UNIT/ML - Inject 28 units daily  Novolog (Insulin aspart) - 10 units twice daily with meal Jardiance 25 mg  - take 1 tablet daily  Is the patient currently on a STATIN medication? Yes Is the patient currently on ACE/ARB medication? Yes Does the patient have >5 day gap between last estimated fill dates? No refill history  Star Rating Drugs:  Medication:  Last Fill: Day Supply Jardiance 25mg          07/05/2020  30ds   Recent Office Vitals: BP Readings from Last 3 Encounters:  05/13/20 140/66  04/15/20 134/62  10/02/19 (!) 130/60   Pulse Readings from Last 3 Encounters:  05/13/20 (!) 55  04/15/20 60  10/02/19 59    Wt Readings from Last 3 Encounters:  05/13/20 171 lb 2 oz (77.6 kg)  04/15/20 171 lb 1 oz (77.6 kg)  10/02/19 175 lb 12.8 oz (79.7 kg)     Kidney Function Lab Results  Component Value Date/Time   CREATININE 1.00 04/08/2020 10:59 AM   CREATININE 0.84 10/03/2019 12:37 PM   CREATININE 1.24 09/30/2013 12:00 AM   CREATININE 1.24 09/30/2013 12:00 AM   GFR 72.74 04/08/2020 10:59 AM    GFRNONAA >60 09/13/2014 01:08 PM   GFRAA >60 09/13/2014 01:08 PM    BMP Latest Ref Rng & Units 04/08/2020 10/03/2019 01/15/2019  Glucose 70 - 99 mg/dL 91 151(H) 148(H)  BUN 6 - 23 mg/dL 17 18 15   Creatinine 0.40 - 1.50 mg/dL 1.00 0.84 1.00  Sodium 135 - 145 mEq/L 141 138 142  Potassium 3.5 - 5.1 mEq/L 3.6 3.9 3.9  Chloride 96 - 112 mEq/L 102 104 102  CO2 19 - 32 mEq/L 33(H) 32 33(H)  Calcium 8.4 - 10.5 mg/dL 9.9 9.3 9.6   Current antihypertensive regimen:  Fosinopril 40 mg - 1 tablet daily  Metoprolol tartrate 25 mg- 1 tablet BID   Adherence Review: Is the patient currently on ACE/ARB medication? Yes Does the patient have >5 day gap between last estimated fill dates? No fill history for ARB  Star Rating Drugs:  Medication:  Last Fill: Day Supply Jardiance 25mg .         07/05/2020          30ds  Follow-Up:  Pharmacist Review  Debbora Dus, CPP notified  Avel Sensor, [place] Assistant [phone]  I have reviewed the care management and care coordination activities outlined in this encounter and I am certifying that I agree with the content of this note. No further action required.  Debbora Dus, PharmD Clinical Pharmacist [facility] at [facility] [phone]

## 2020-07-31 ENCOUNTER — Encounter: Payer: Self-pay | Admitting: Podiatry

## 2020-07-31 ENCOUNTER — Other Ambulatory Visit: Payer: Self-pay

## 2020-07-31 ENCOUNTER — Ambulatory Visit: Payer: PPO | Admitting: Podiatry

## 2020-07-31 DIAGNOSIS — M79674 Pain in right toe(s): Secondary | ICD-10-CM | POA: Diagnosis not present

## 2020-07-31 DIAGNOSIS — M79675 Pain in left toe(s): Secondary | ICD-10-CM | POA: Diagnosis not present

## 2020-07-31 DIAGNOSIS — E1142 Type 2 diabetes mellitus with diabetic polyneuropathy: Secondary | ICD-10-CM | POA: Diagnosis not present

## 2020-07-31 DIAGNOSIS — B351 Tinea unguium: Secondary | ICD-10-CM

## 2020-07-31 NOTE — Progress Notes (Signed)
Subjective: Jeffrey [name] is a pleasant 80 y.o. male patient seen today for at-risk footcare with  painful thick toenails that are difficult to trim. Pain interferes with ambulation. Aggravating factors include wearing enclosed shoe gear. Pain is relieved with periodic professional debridement.  He states left great toe is a little sore when touched.   PCP is [name], MD. Last visit was: 05/13/2020.  Allergies  Allergen Reactions  . Niacin Other (See Comments)    REACTION: intolerant,not allergic="flushing,hot flashes,turning red" REACTION: intolerant,not allergic="flushing,hot flashes,turning red" Other reaction(s): flushed skin/rash Other reaction(s): flushed skin/rash   Objective: Physical Exam  General: [name] is a pleasant 80 y.o. Caucasian male, WD, WN in NAD. AAO x 3.   Vascular:  Capillary fill time to digits <3 seconds b/l lower extremities. Palpable pedal pulses b/l LE. Pedal hair absent. Lower extremity skin temperature gradient within normal limits. Dependent rubor noted b/l lower extremities. No pain with calf compression b/l. +1 pitting edema b/l lower extremities. No warmth to b/l LE. Early blister formation anterior aspect of legs with no weeping. No open wounds.  Dermatological:  Pedal skin with normal turgor, texture and tone bilaterally. No open wounds bilaterally. No interdigital macerations bilaterally. Toenails 1-5 b/l elongated, discolored, dystrophic, thickened, crumbly with subungual debris and tenderness to dorsal palpation. Incurvated nailplate lateral border(s) L hallux.  Nail border hypertrophy absent. There is tenderness to palpation. Sign(s) of infection: no clinical signs of infection noted on examination today.  Musculoskeletal:  Normal muscle strength 5/5 to all lower extremity muscle groups bilaterally. No pain crepitus or joint limitation noted with ROM b/l. No gross bony deformities bilaterally. Utilizes wheelchair for mobility  assistance.  Neurological:  Protective sensation diminished with 10g monofilament b/l.  Assessment and Plan:  1. Pain due to onychomycosis of toenails of both feet   2. DM type 2 with diabetic peripheral neuropathy ([place])     -Examined patient. -Patient to continue soft, supportive shoe gear daily. -Toenails 1-5 b/l were debrided in length and girth with sterile nail nippers and dremel without iatrogenic bleeding.  -Offending nail border debrided and curretaged L hallux utilizing sterile nail nipper and currette. Border(s) cleansed with alcohol and triple antibiotic ointment applied. Patient instructed to apply antibiotic ointment to L hallux once daily for 3 days. -Patient to report any pedal injuries to medical professional immediately. -Discussed edema with patient and daughter. Advised daughter to phone PCP or Cardiologist for guidance today regarding LE edema. Also advised him to limit time in wheelchair with legs in dependent position. Recommended he sit in recliner with legs elevated. If legs become red, more swollen and warm, report to ED. -Patient/POA to call should there be question/concern in the interim.  Return in about 3 months (around 10/31/2020).  Marzetta Board, DPM

## 2020-08-25 DIAGNOSIS — E1142 Type 2 diabetes mellitus with diabetic polyneuropathy: Secondary | ICD-10-CM | POA: Diagnosis not present

## 2020-08-25 DIAGNOSIS — I10 Essential (primary) hypertension: Secondary | ICD-10-CM | POA: Diagnosis not present

## 2020-08-25 DIAGNOSIS — R609 Edema, unspecified: Secondary | ICD-10-CM | POA: Diagnosis not present

## 2020-08-25 DIAGNOSIS — E1165 Type 2 diabetes mellitus with hyperglycemia: Secondary | ICD-10-CM | POA: Diagnosis not present

## 2020-08-25 DIAGNOSIS — E11319 Type 2 diabetes mellitus with unspecified diabetic retinopathy without macular edema: Secondary | ICD-10-CM | POA: Diagnosis not present

## 2020-08-25 DIAGNOSIS — Z8673 Personal history of transient ischemic attack (TIA), and cerebral infarction without residual deficits: Secondary | ICD-10-CM | POA: Diagnosis not present

## 2020-08-25 DIAGNOSIS — Z794 Long term (current) use of insulin: Secondary | ICD-10-CM | POA: Diagnosis not present

## 2020-08-25 DIAGNOSIS — E785 Hyperlipidemia, unspecified: Secondary | ICD-10-CM | POA: Diagnosis not present

## 2020-08-26 ENCOUNTER — Telehealth: Payer: Self-pay

## 2020-08-26 NOTE — Chronic Care Management (AMB) (Addendum)
Chronic Care Management Pharmacy Assistant   Name: Jeffrey [name]  MRN: [id number] DOB: 08/10/1940  Reason for Encounter: Disease State  HTN   Recent office visits:  None since last CCM Contact  Recent consult visits:  07/31/20 - Podiatry - No medication changes   Hospital visits:  None in previous 6 months  Medications: Outpatient Encounter Medications as of 08/26/2020  Medication Sig   ACIDOPHILUS LACTOBACILLUS PO Take 2 tablets by mouth daily.   atorvastatin (LIPITOR) 80 MG tablet Take 80 mg by mouth at bedtime.   baclofen (LIORESAL) 10 MG tablet Take by mouth.   Cholecalciferol 25 MCG (1000 UT) tablet Take by mouth.   clopidogrel (PLAVIX) 75 MG tablet Take by mouth.   COVID-19 mRNA Vac-TriS, Pfizer, SUSP injection USE AS DIRECTED   doxycycline (VIBRAMYCIN) 100 MG capsule Take 100 mg by mouth 2 (two) times daily.   empagliflozin (JARDIANCE) 25 MG TABS tablet Take 1 tablet by mouth daily.   fosinopril ([name]) 40 MG tablet Take by mouth.   insulin aspart (NOVOLOG FLEXPEN) 100 UNIT/ML FlexPen Inject 10 Units into the skin in the morning and at bedtime. With meals   insulin glargine (LANTUS) 100 UNIT/ML Solostar Pen Inject into the skin.   LACTOBACILLUS PO Take 1 capsule by mouth daily. [facility] colon health.  Takes M/W/F   loratadine (CLARITIN) 10 MG tablet 1 tablet   metoprolol tartrate (LOPRESSOR) 25 MG tablet 1 tablet   Potassium 99 MG TABS Take 1 tablet (99 mg total) by mouth daily as needed (when you take 2 lasix pills).   sertraline (ZOLOFT) 25 MG tablet Take by mouth.   triamcinolone (KENALOG) 0.1 % SMARTSIG:1 Application Topical 2-3 Times Daily   No facility-administered encounter medications on file as of 08/26/2020.   Recent Office Vitals: BP Readings from Last 3 Encounters:  05/13/20 140/66  04/15/20 134/62  10/02/19 (!) 130/60   Pulse Readings from Last 3 Encounters:  05/13/20 (!) 55  04/15/20 60  10/02/19 59    Wt Readings from Last 3 Encounters:   05/13/20 171 lb 2 oz (77.6 kg)  04/15/20 171 lb 1 oz (77.6 kg)  10/02/19 175 lb 12.8 oz (79.7 kg)     Kidney Function Lab Results  Component Value Date/Time   CREATININE 1.00 04/08/2020 10:59 AM   CREATININE 0.84 10/03/2019 12:37 PM   CREATININE 1.24 09/30/2013 12:00 AM   CREATININE 1.24 09/30/2013 12:00 AM   GFR 72.74 04/08/2020 10:59 AM   GFRNONAA >60 09/13/2014 01:08 PM   GFRAA >60 09/13/2014 01:08 PM    BMP Latest Ref Rng & Units 04/08/2020 10/03/2019 01/15/2019  Glucose 70 - 99 mg/dL 91 151(H) 148(H)  BUN 6 - 23 mg/dL 17 18 15   Creatinine 0.40 - 1.50 mg/dL 1.00 0.84 1.00  Sodium 135 - 145 mEq/L 141 138 142  Potassium 3.5 - 5.1 mEq/L 3.6 3.9 3.9  Chloride 96 - 112 mEq/L 102 104 102  CO2 19 - 32 mEq/L 33(H) 32 33(H)  Calcium 8.4 - 10.5 mg/dL 9.9 9.3 9.6     Current antihypertensive regimen:  Fosinopril 40 mg - 1 tablet daily Metoprolol tartrate 25 mg- 1 tablet BID    Adherence Review: Is the patient currently on ACE/ARB medication? Yes Does the patient have >5 day gap between last estimated fill dates?Unable to verify fosinopril refills   Star Rating Drugs:  Medication:   Last Fill: Day Supply Fosinopril 40mg     -  - [place] 25mg   07/05/20  30  Attempted contact with [name] 3 times on 08/26/20, 09/01/20, 09/02/20 Unsuccessful outreach. Will attempt contact next month.   Follow-Up:  Pharmacist Review  Debbora Dus, CPP notified  Avel Sensor, [place] Assistant [phone]   I have reviewed the care management and care coordination activities outlined in this encounter and I am certifying that I agree with the content of this note. No further action required.  Debbora Dus, PharmD Clinical Pharmacist [facility] at [facility] [phone]

## 2020-09-11 ENCOUNTER — Ambulatory Visit: Payer: PPO | Admitting: Family Medicine

## 2020-10-08 ENCOUNTER — Ambulatory Visit (HOSPITAL_BASED_OUTPATIENT_CLINIC_OR_DEPARTMENT_OTHER): Payer: PPO | Admitting: Cardiology

## 2020-10-14 ENCOUNTER — Encounter: Payer: Self-pay | Admitting: Family Medicine

## 2020-10-14 ENCOUNTER — Other Ambulatory Visit: Payer: Self-pay

## 2020-10-14 ENCOUNTER — Ambulatory Visit (INDEPENDENT_AMBULATORY_CARE_PROVIDER_SITE_OTHER): Payer: PPO | Admitting: Family Medicine

## 2020-10-14 VITALS — BP 130/70 | HR 54 | Temp 97.6°F | Ht 67.0 in | Wt 167.4 lb

## 2020-10-14 DIAGNOSIS — R591 Generalized enlarged lymph nodes: Secondary | ICD-10-CM | POA: Insufficient documentation

## 2020-10-14 DIAGNOSIS — E1140 Type 2 diabetes mellitus with diabetic neuropathy, unspecified: Secondary | ICD-10-CM | POA: Diagnosis not present

## 2020-10-14 DIAGNOSIS — R1319 Other dysphagia: Secondary | ICD-10-CM | POA: Diagnosis not present

## 2020-10-14 DIAGNOSIS — N481 Balanitis: Secondary | ICD-10-CM

## 2020-10-14 LAB — CBC WITH DIFFERENTIAL/PLATELET
Basophils Absolute: 0.1 10*3/uL (ref 0.0–0.1)
Basophils Relative: 0.8 % (ref 0.0–3.0)
Eosinophils Absolute: 0.1 10*3/uL (ref 0.0–0.7)
Eosinophils Relative: 2 % (ref 0.0–5.0)
HCT: 44.5 % (ref 39.0–52.0)
Hemoglobin: 14.6 g/dL (ref 13.0–17.0)
Lymphocytes Relative: 20.6 % (ref 12.0–46.0)
Lymphs Abs: 1.4 10*3/uL (ref 0.7–4.0)
MCHC: 32.7 g/dL (ref 30.0–36.0)
MCV: 89 fl (ref 78.0–100.0)
Monocytes Absolute: 0.5 10*3/uL (ref 0.1–1.0)
Monocytes Relative: 7.8 % (ref 3.0–12.0)
Neutro Abs: 4.6 10*3/uL (ref 1.4–7.7)
Neutrophils Relative %: 68.8 % (ref 43.0–77.0)
Platelets: 162 10*3/uL (ref 150.0–400.0)
RBC: 5 Mil/uL (ref 4.22–5.81)
RDW: 13.8 % (ref 11.5–15.5)
WBC: 6.7 10*3/uL (ref 4.0–10.5)

## 2020-10-14 LAB — BASIC METABOLIC PANEL
BUN: 25 mg/dL — ABNORMAL HIGH (ref 6–23)
CO2: 29 mEq/L (ref 19–32)
Calcium: 9.6 mg/dL (ref 8.4–10.5)
Chloride: 106 mEq/L (ref 96–112)
Creatinine, Ser: 0.97 mg/dL (ref 0.40–1.50)
GFR: 75.18 mL/min (ref >60.00)
Glucose, Bld: 94 mg/dL (ref 70–99)
Potassium: 3.8 mEq/L (ref 3.5–5.1)
Sodium: 142 mEq/L (ref 135–145)

## 2020-10-14 LAB — HEMOGLOBIN A1C: Hgb A1c MFr Bld: 7.4 % — ABNORMAL HIGH (ref 4.6–6.5)

## 2020-10-14 MED ORDER — PANTOPRAZOLE SODIUM 20 MG PO TBEC: 20.0000 mg | DELAYED_RELEASE_TABLET | Freq: Every day | ORAL | 1 refills | Status: DC

## 2020-10-14 NOTE — Patient Instructions (Addendum)
Start pantoprazole '20mg'$  daily - sent to pharmacy. We will refer you to GI to further evaluate difficulty swallowing.  For skin rash - anticipate yeast infection - treat with clotrimazole cream over the counter (lotrmin) twice daily for 2 weeks. Let us know if ongoing symptoms after this.  For swollen gland in neck - recommend checking neck CT for further evaluation - we will be in touch to schedule this.

## 2020-10-14 NOTE — Assessment & Plan Note (Addendum)
New over 2 wks, with associated dysphagia and noted weight loss. No URI symptoms. Will check CBC, BMP, and contrasted neck CT. Pt/daughter agree with plan.

## 2020-10-14 NOTE — Assessment & Plan Note (Signed)
Anticipate yeast balanitis - rec start clotrimazole BID x 2 wks, update with effect

## 2020-10-14 NOTE — Assessment & Plan Note (Signed)
Describes solid food esophageal dysphagia ongoing for 5-6 months.  Pending neck CT - will refer to GI for further evaluation. No significant GERD symptoms - but will also trial low dose PPI in interim.

## 2020-10-14 NOTE — Assessment & Plan Note (Signed)
Followed by endo Buddy Duty).  Continue current regimen.

## 2020-10-14 NOTE — Progress Notes (Signed)
Patient ID: Jeffrey [name], male    DOB: December 01, 1940, 80 y.o.   MRN: [id number]  This visit was conducted in person.  BP 130/70   Pulse (!) 54   Temp 97.6 F (36.4 C) (Temporal)   Ht '5\' 7"'$  (1.702 m)   Wt 167 lb 7 oz (75.9 kg)   SpO2 98%   BMI 26.22 kg/m    CC: 6 mo f/u visit  Subjective:   HPI: Jeffrey [name] is a 80 y.o. male presenting on 10/14/2020 for Follow-up (Here for 6 mo f/u.  Pt accompanied by daughter, [name]- temp 97.4.)   Notes esophageal dysphagia with choking for 5-6 months. Notes this when eating grapes, or acidic foods. Limiting meat due to this as well. No nausea/abd pain, vomiting, early satiety. Denies significant GERD symptoms.   Notes non tender knot to R neck, present for several weeks. No ST, fever. No significant smoking history, no alcohol history.   Notes red rash to tip of penis that comes and goes, associated with swelling. Discomfort associated with it. No fevers. Has treated with balmex and desitin baby cream without benefit. Notes mild incontinence from leaking due to lasix. No new sexual partners.   DM - followed by endocrinology Buddy Duty) . Does regularly check sugars fasting - this morning 176, normally 120-130s. Compliant with antihyperglycemic regimen which includes: jardiance '25mg'$  daily, novolog 10u BID, lantus 24u daily. Denies low sugars, seldom hypoglycemic symptoms managed with sugar intake. Denies paresthesias, blurry vision. Last diabetic eye exam DUE. Glucometer brand: freestyle. Last foot exam: with podiatry 07/2020.  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 Right handed. Saw [facility] neuro-ophth for persistent diplopia with R homonymous  hemianopsia s/p CVA.      Relevant past medical, surgical, family and social history reviewed and updated as indicated. Interim medical history since our last visit reviewed. Allergies and medications reviewed and updated. Outpatient Medications Prior to Visit  Medication Sig Dispense Refill   atorvastatin (LIPITOR) 80 MG tablet Take 80 mg by mouth at bedtime.     baclofen (LIORESAL) 10 MG tablet Take by mouth.     Cholecalciferol 25 MCG (1000 UT) tablet Take by mouth.     empagliflozin (JARDIANCE) 25 MG TABS tablet Take 1 tablet by mouth daily.     insulin aspart (NOVOLOG FLEXPEN) 100 UNIT/ML FlexPen Inject 10 Units into the skin in the morning and at bedtime. With meals     LACTOBACILLUS PO Take 1 capsule by mouth daily. [facility]ips colon health.  Takes M/W/F     loratadine (CLARITIN) 10 MG tablet 1 tablet     Potassium 99 MG TABS Take 1 tablet (99 mg total) by mouth daily as needed (when you take 2 lasix pills).     clopidogrel (PLAVIX) 75 MG tablet Take by mouth.     fosinopril ([name]) 40 MG tablet Take by mouth.     furosemide (LASIX) 20 MG tablet Take 2 tablets (40 mg total) by mouth 3 (three) times a week. 30 tablet 3   insulin glargine (LANTUS) 100 UNIT/ML Solostar Pen Inject into the skin.     metoprolol tartrate (LOPRESSOR) 25 MG  tablet 1 tablet     sertraline (ZOLOFT) 25 MG tablet Take by mouth.     ACIDOPHILUS LACTOBACILLUS PO Take 2 tablets by mouth daily.     clopidogrel (PLAVIX) 75 MG tablet Take 1 tablet (75 mg total) by mouth daily.     fosinopril ([name]) 40 MG tablet Take 1 tablet (40 mg total) by mouth daily.     furosemide (LASIX) 20 MG tablet Take 1 tablet (20 mg total) by mouth daily AND 2 tablets (40 mg total) 3 (three) times a week. Every day '20mg'$ , some days '40mg'$ .     insulin glargine (LANTUS) 100 UNIT/ML Solostar Pen Inject 24 Units into the skin at bedtime.     metoprolol tartrate (LOPRESSOR) 25 MG tablet Take 1 tablet (25 mg total) by mouth 2 (two) times daily.      sertraline (ZOLOFT) 25 MG tablet Take 1 tablet (25 mg total) by mouth daily.     COVID-19 mRNA Vac-TriS, Pfizer, SUSP injection USE AS DIRECTED .3 mL 0   doxycycline (VIBRAMYCIN) 100 MG capsule Take 100 mg by mouth 2 (two) times daily.     furosemide (LASIX) 20 MG tablet Take 2 tablets (40 mg total) by mouth 3 (three) times a week.     metoprolol tartrate (LOPRESSOR) 25 MG tablet Take 1 tablet (25 mg total) by mouth daily.     triamcinolone (KENALOG) 0.1 % SMARTSIG:1 Application Topical 2-3 Times Daily     No facility-administered medications prior to visit.     Per HPI unless specifically indicated in ROS section below Review of Systems  Objective:  BP 130/70   Pulse (!) 54   Temp 97.6 F (36.4 C) (Temporal)   Ht '5\' 7"'$  (1.702 m)   Wt 167 lb 7 oz (75.9 kg)   SpO2 98%   BMI 26.22 kg/m   Wt Readings from Last 3 Encounters:  10/14/20 167 lb 7 oz (75.9 kg)  05/13/20 171 lb 2 oz (77.6 kg)  04/15/20 171 lb 1 oz (77.6 kg)      Physical Exam Vitals and nursing note reviewed.  Constitutional:      Appearance: Normal appearance. He is not ill-appearing.     Comments: Sitting in transport chair  HENT:     Mouth/Throat:     Mouth: Mucous membranes are moist.     Pharynx: Oropharynx is clear. No oropharyngeal exudate or posterior oropharyngeal erythema.  Eyes:     Extraocular Movements: Extraocular movements intact.     Pupils: Pupils are equal, round, and reactive to light.  Cardiovascular:     Rate and Rhythm: Normal rate and regular rhythm.     Pulses: Normal pulses.     Heart sounds: Normal heart sounds. No murmur heard. Pulmonary:     Effort: Pulmonary effort is normal. No respiratory distress.     Breath sounds: Normal breath sounds. No wheezing or rales.  Chest:  Breasts:    Right: No supraclavicular adenopathy.     Left: No supraclavicular adenopathy.  Genitourinary:    Pubic Area: Rash present.     Penis: Uncircumcised. Erythema present. No phimosis, paraphimosis  or discharge.      Testes: Normal.     Comments:  Mild erythema to distal foreskin Erythematous macular rash to glans penis Musculoskeletal:     Cervical back: Normal range of motion and neck supple.     Right lower leg: No edema.     Left lower leg: No edema.     Comments: Chronic  R sided hemiparesis  Lymphadenopathy:     Head:     Right side of head: Tonsillar (~1-2cm nontender somewhat mobile) adenopathy present. No submental, submandibular, preauricular, posterior auricular or occipital adenopathy.     Left side of head: No submental, submandibular, tonsillar, preauricular, posterior auricular or occipital adenopathy.     Cervical: No cervical adenopathy.     Right cervical: No superficial or deep cervical adenopathy.    Left cervical: No superficial cervical adenopathy.     Upper Body:     Right upper body: No supraclavicular adenopathy.     Left upper body: No supraclavicular adenopathy.  Skin:    General: Skin is warm and dry.     Findings: No rash.  Neurological:     Mental Status: He is alert.      Results for orders placed or performed in visit on 04/08/20  CBC with Differential/Platelet  Result Value Ref Range   WBC 7.3 4.0 - 10.5 K/uL   RBC 4.72 4.22 - 5.81 Mil/uL   Hemoglobin 14.6 13.0 - 17.0 g/dL   HCT 41.9 39.0 - 52.0 %   MCV 88.8 78.0 - 100.0 fl   MCHC 34.9 30.0 - 36.0 g/dL   RDW 13.2 11.5 - 15.5 %   Platelets 179.0 150.0 - 400.0 K/uL   Neutrophils Relative % 67.1 43.0 - 77.0 %   Lymphocytes Relative 23.4 12.0 - 46.0 %   Monocytes Relative 7.0 3.0 - 12.0 %   Eosinophils Relative 1.8 0.0 - 5.0 %   Basophils Relative 0.7 0.0 - 3.0 %   Neutro Abs 4.9 1.4 - 7.7 K/uL   Lymphs Abs 1.7 0.7 - 4.0 K/uL   Monocytes Absolute 0.5 0.1 - 1.0 K/uL   Eosinophils Absolute 0.1 0.0 - 0.7 K/uL   Basophils Absolute 0.0 0.0 - 0.1 K/uL  Hepatitis C antibody  Result Value Ref Range   Hepatitis C Ab NON-REACTIVE NON-REACTI   SIGNAL TO CUT-OFF 0.01 <1.00  PSA  Result Value  Ref Range   PSA 2.53 0.10 - 4.00 ng/mL  Uric acid  Result Value Ref Range   Uric Acid, Serum 5.9 4.0 - 7.8 mg/dL  Hemoglobin A1c  Result Value Ref Range   Hgb A1c MFr Bld 7.9 (H) 4.6 - 6.5 %  Comprehensive metabolic panel  Result Value Ref Range   Sodium 141 135 - 145 mEq/L   Potassium 3.6 3.5 - 5.1 mEq/L   Chloride 102 96 - 112 mEq/L   CO2 33 (H) 19 - 32 mEq/L   Glucose, Bld 91 70 - 99 mg/dL   BUN 17 6 - 23 mg/dL   Creatinine, Ser 1.00 0.40 - 1.50 mg/dL   Total Bilirubin 1.0 0.2 - 1.2 mg/dL   Alkaline Phosphatase 94 39 - 117 U/L   AST 17 0 - 37 U/L   ALT 23 0 - 53 U/L   Total Protein 6.6 6.0 - 8.3 g/dL   Albumin 4.1 3.5 - 5.2 g/dL   GFR 72.74 >60.00 mL/min   Calcium 9.9 8.4 - 10.5 mg/dL  Lipid panel  Result Value Ref Range   Cholesterol 153 0 - 200 mg/dL   Triglycerides 179.0 (H) 0.0 - 149.0 mg/dL   HDL 35.80 (L) >39.00 mg/dL   VLDL 35.8 0.0 - 40.0 mg/dL   LDL Cholesterol 82 0 - 99 mg/dL   Total CHOL/HDL Ratio 4    NonHDL 117.40     Assessment & Plan:  This visit occurred during the SARS-CoV-2 public  health emergency.  Safety protocols were in place, including screening questions prior to the visit, additional usage of staff PPE, and extensive cleaning of exam room while observing appropriate contact time as indicated for disinfecting solutions.   Problem List Items Addressed This Visit     Type 2 diabetes, controlled, with neuropathy ([place])    Followed by endo Buddy Duty).  Continue current regimen.        Relevant Medications   insulin glargine (LANTUS) 100 UNIT/ML Solostar Pen   fosinopril ([name]) 40 MG tablet   Other Relevant Orders   Basic metabolic panel   Hemoglobin A1c   Head and neck lymphadenopathy - Primary    New over 2 wks, with associated dysphagia and noted weight loss. No URI symptoms. Will check CBC, BMP, and contrasted neck CT. Pt/daughter agree with plan.        Relevant Orders   CT Soft Tissue Neck W Contrast   CBC with  Differential/Platelet   Basic metabolic panel   Esophageal dysphagia    Describes solid food esophageal dysphagia ongoing for 5-6 months.  Pending neck CT - will refer to GI for further evaluation. No significant GERD symptoms - but will also trial low dose PPI in interim.        Relevant Orders   Ambulatory referral to Gastroenterology   Balanitis    Anticipate yeast balanitis - rec start clotrimazole BID x 2 wks, update with effect         Meds ordered this encounter  Medications   pantoprazole (PROTONIX) 20 MG tablet    Sig: Take 1 tablet (20 mg total) by mouth daily.    Dispense:  30 tablet    Refill:  1   Orders Placed This Encounter  Procedures   CT Soft Tissue Neck W Contrast    Standing Status:   Future    Standing Expiration Date:   10/14/2021    Order Specific Question:   If indicated for the ordered procedure, I authorize the administration of contrast media per Radiology protocol    Answer:   Yes    Order Specific Question:   Preferred imaging location?    Answer:   [place]   CBC with Differential/Platelet   Basic metabolic panel   Hemoglobin A1c   Ambulatory referral to Gastroenterology    Referral Priority:   Routine    Referral Type:   Consultation    Referral Reason:   Specialty Services Required    Number of Visits Requested:   1     Patient Instructions  Start pantoprazole '20mg'$  daily - sent to pharmacy. We will refer you to GI to further evaluate difficulty swallowing.  For skin rash - anticipate yeast infection - treat with clotrimazole cream over the counter (lotrmin) twice daily for 2 weeks. Let us know if ongoing symptoms after this.  For swollen gland in neck - recommend checking neck CT for further evaluation - we will be in touch to schedule this.   Follow up plan: Return in about 6 months (around 04/16/2021), or if symptoms worsen or fail to improve, for annual exam, prior fasting for blood work, medicare wellness  visit.  [name], MD

## 2020-10-20 ENCOUNTER — Other Ambulatory Visit: Payer: PPO

## 2020-10-21 ENCOUNTER — Telehealth: Payer: Self-pay | Admitting: Family Medicine

## 2020-10-21 NOTE — Telephone Encounter (Signed)
Jeffrey [name] called in and asking for the results of the lab work. She stated that he is going to the dr to have a place on his neck looked at.

## 2020-10-21 NOTE — Telephone Encounter (Signed)
Spoke with pt's daughter, [name] (on dpr) notifying her results were sent to pt's MyChart.  She requests copy be mailed.  Results (10/14/20) mailed to patient.

## 2020-10-22 ENCOUNTER — Other Ambulatory Visit: Payer: Self-pay

## 2020-10-22 ENCOUNTER — Ambulatory Visit (INDEPENDENT_AMBULATORY_CARE_PROVIDER_SITE_OTHER)
Admission: RE | Admit: 2020-10-22 | Discharge: 2020-10-22 | Disposition: A | Discharge status: Home / Self Care | Payer: PPO | Source: Ambulatory Visit | Attending: Family Medicine | Admitting: Family Medicine

## 2020-10-22 DIAGNOSIS — R1310 Dysphagia, unspecified: Secondary | ICD-10-CM | POA: Diagnosis not present

## 2020-10-22 DIAGNOSIS — R591 Generalized enlarged lymph nodes: Secondary | ICD-10-CM

## 2020-10-22 DIAGNOSIS — R599 Enlarged lymph nodes, unspecified: Secondary | ICD-10-CM | POA: Diagnosis not present

## 2020-10-22 MED ORDER — IOHEXOL 300 MG/ML  SOLN
75.0000 mL | Freq: Once | INTRAMUSCULAR | Status: AC | PRN
  Administered 2020-10-22: 75 mL via INTRAVENOUS

## 2020-10-23 ENCOUNTER — Telehealth: Payer: Self-pay | Admitting: Family Medicine

## 2020-10-23 MED ORDER — MUPIROCIN CALCIUM 2 % EX CREA: 1.0000 "application " | TOPICAL_CREAM | Freq: Two times a day (BID) | CUTANEOUS | 0 refills | Status: DC

## 2020-10-23 NOTE — Addendum Note (Signed)
Addended by: [name] on: 10/23/2020 05:12 PM   Modules accepted: Orders

## 2020-10-23 NOTE — Telephone Encounter (Signed)
Lvm asking pt/pt's daughter, [name] (on dpr), to call back.  Need to relay Dr. Synthia Innocent message.

## 2020-10-23 NOTE — Telephone Encounter (Addendum)
Recommend he treat as possible bacterial infection with mupirocin abx I've sent to pharmacy.  If not better with this, would suggest he have dermatologist look at it - let me know if he needs a referral.

## 2020-10-23 NOTE — Telephone Encounter (Signed)
Mrs. [name] called in and stated that Jeffrey [name] saw Dr. Darnell Level last week to look at the tip of his penis and he said that it look like a yeast infection and to get a cream, but the cream is not working and wanted to know about to do next.

## 2020-10-26 ENCOUNTER — Other Ambulatory Visit: Payer: Self-pay | Admitting: Family Medicine

## 2020-10-26 DIAGNOSIS — R591 Generalized enlarged lymph nodes: Secondary | ICD-10-CM

## 2020-10-28 ENCOUNTER — Telehealth: Payer: Self-pay | Admitting: Family Medicine

## 2020-10-28 DIAGNOSIS — R591 Generalized enlarged lymph nodes: Secondary | ICD-10-CM

## 2020-10-28 NOTE — Telephone Encounter (Signed)
Jeffrey [name] - ENT appt scheduled with Dr Lucia Gaskins for 12/02/2020 however per report this ENT is retiring in October. Are we able to get pt in any sooner with different [facility] to further evaluate suspicious lymph node?   Lisa/Dustin - GI referral was already placed. [facility] is booking out pretty far in advance. Please have them call [facility] directly at [phone] to see if they can schedule the appt.

## 2020-10-28 NOTE — Telephone Encounter (Signed)
Mrs. Jeffrey [name] called in and stated that [facility] called and stated that when they called her they told her that the ENT is retiring in October, and wanted to know about getting another referral to somewhere else. And haven't heard back from the esophagus

## 2020-10-29 ENCOUNTER — Encounter: Payer: Self-pay | Admitting: Physician Assistant

## 2020-10-29 NOTE — Telephone Encounter (Signed)
Spoke with patient's daughter and she stated that she will be calling to set up that appointment for GI  but would like someone different for the ENT because she would like him to have someone who is going to be there and not retiring.

## 2020-11-03 ENCOUNTER — Other Ambulatory Visit: Payer: Self-pay

## 2020-11-03 ENCOUNTER — Encounter: Payer: Self-pay | Admitting: Podiatry

## 2020-11-03 ENCOUNTER — Ambulatory Visit: Payer: PPO | Admitting: Podiatry

## 2020-11-03 DIAGNOSIS — E1142 Type 2 diabetes mellitus with diabetic polyneuropathy: Secondary | ICD-10-CM

## 2020-11-03 DIAGNOSIS — B351 Tinea unguium: Secondary | ICD-10-CM

## 2020-11-03 DIAGNOSIS — M79675 Pain in left toe(s): Secondary | ICD-10-CM | POA: Diagnosis not present

## 2020-11-03 DIAGNOSIS — M79674 Pain in right toe(s): Secondary | ICD-10-CM

## 2020-11-03 NOTE — Telephone Encounter (Signed)
New referral placed for ENT

## 2020-11-03 NOTE — Progress Notes (Signed)
    Subjective:  Patient ID: Jeffrey [name], male    DOB: 18-Mar-1940,  MRN: [id number]  80 y.o. male presents with at risk foot care with history of diabetic neuropathy and painful thick toenails that are difficult to trim. Pain interferes with ambulation. Aggravating factors include wearing enclosed shoe gear. Pain is relieved with periodic professional debridement.  He has h/o CVA with right sided hemiparesis. He uses wheelchair for mobility.  Patient's blood sugar was 136 mg/dl today.  Last A1c was 7.4%.  Jeffrey [name] is accompanied by his daughter on today's visit. He states left great toe is a little tender. Denies any redness, drainage or swelling.  PCP: Jeffrey Bush, MD and last visit was: 10/14/2020.  Review of Systems: Negative except as noted in the HPI.   Allergies  Allergen Reactions   Niacin Other (See Comments)    REACTION: intolerant,not allergic="flushing,hot flashes,turning red" REACTION: intolerant,not allergic="flushing,hot flashes,turning red" Other reaction(s): flushed skin/rash Other reaction(s): flushed skin/rash    Objective:  There were no vitals filed for this visit. Constitutional Patient is a pleasant 80 y.o. Caucasian male WD, WN in NAD. AAO x 3.  Vascular Capillary fill time to digits <3 seconds b/l lower extremities. Faintly palpable DP pulse(s) b/l lower extremities. Nonpalpable PT pulse(s) b/l lower extremities. Pedal hair absent. Lower extremity skin temperature gradient within normal limits. No pain with calf compression b/l. Trace edema BLE. No cyanosis or clubbing noted.  Neurologic Normal speech. Protective sensation intact 5/5 intact bilaterally with 10g monofilament b/l. Vibratory sensation intact b/l.  Dermatologic Pedal skin is thin shiny, atrophic b/l lower extremities. No open wounds b/l lower extremities. No interdigital macerations b/l lower extremities. Toenails 1-5 b/l elongated, discolored, dystrophic, thickened, crumbly with subungual  debris and tenderness to dorsal palpation.Incurvated nailplate right great toe lateral border(s) with tenderness to palpation. No erythema, no edema, no drainage noted.   Orthopedic: Normal muscle strength 5/5 to all lower extremity muscle groups LLE.  Hemiparesis of RLE. Utilizes wheelchair for mobility.   Hemoglobin A1C Latest Ref Rng & Units 10/14/2020 04/08/2020  HGBA1C 4.6 - 6.5 % 7.4(H) 7.9(H)  Some recent data might be hidden   Assessment:   1. Pain due to onychomycosis of toenails of both feet   2. DM type 2 with diabetic peripheral neuropathy ([place])    Plan:  Patient was evaluated and treated and all questions answered. Consent given for treatment as described below: -Examined patient. -Continue diabetic foot care principles: inspect feet daily, monitor glucose as recommended by PCP and/or Endocrinologist, and follow prescribed diet per PCP, Endocrinologist and/or dietician. -Patient to continue soft, supportive shoe gear daily. -Toenails 1-5 b/l were debrided in length and girth with sterile nail nippers and dremel without iatrogenic bleeding.  -Offending nail border debrided and curretaged L hallux utilizing sterile nail nipper and currette. Border(s) cleansed with alcohol and triple antibiotic ointment applied. Patient instructed to apply Neosporin to L hallux once daily for 7 days. -Patient to report any pedal injuries to medical professional immediately. -Patient/POA to call should there be question/concern in the interim.  Return in about 3 months (around 02/03/2021).  Marzetta Board, DPM

## 2020-11-03 NOTE — Telephone Encounter (Signed)
I have sent the referral to [facility] in: [facility] Address: [address] [place], [place],  [address] Phone: [phone]  He can await their call to schedule or or he can call to schedule.  I sent patient a mychat message as well.

## 2020-11-03 NOTE — Addendum Note (Signed)
Addended by: Virl Cagey on: 11/03/2020 03:46 PM   Modules accepted: Orders

## 2020-11-05 ENCOUNTER — Other Ambulatory Visit: Payer: Self-pay | Admitting: Family Medicine

## 2020-11-06 DIAGNOSIS — E785 Hyperlipidemia, unspecified: Secondary | ICD-10-CM | POA: Insufficient documentation

## 2020-11-06 DIAGNOSIS — N400 Enlarged prostate without lower urinary tract symptoms: Secondary | ICD-10-CM | POA: Insufficient documentation

## 2020-11-06 DIAGNOSIS — D126 Benign neoplasm of colon, unspecified: Secondary | ICD-10-CM | POA: Insufficient documentation

## 2020-11-24 DIAGNOSIS — I10 Essential (primary) hypertension: Secondary | ICD-10-CM | POA: Diagnosis not present

## 2020-11-24 DIAGNOSIS — E785 Hyperlipidemia, unspecified: Secondary | ICD-10-CM | POA: Diagnosis not present

## 2020-11-24 DIAGNOSIS — E1142 Type 2 diabetes mellitus with diabetic polyneuropathy: Secondary | ICD-10-CM | POA: Diagnosis not present

## 2020-11-24 DIAGNOSIS — Z794 Long term (current) use of insulin: Secondary | ICD-10-CM | POA: Diagnosis not present

## 2020-11-24 DIAGNOSIS — R609 Edema, unspecified: Secondary | ICD-10-CM | POA: Diagnosis not present

## 2020-11-24 DIAGNOSIS — Z23 Encounter for immunization: Secondary | ICD-10-CM | POA: Diagnosis not present

## 2020-11-24 DIAGNOSIS — E11319 Type 2 diabetes mellitus with unspecified diabetic retinopathy without macular edema: Secondary | ICD-10-CM | POA: Diagnosis not present

## 2020-11-24 DIAGNOSIS — Z8673 Personal history of transient ischemic attack (TIA), and cerebral infarction without residual deficits: Secondary | ICD-10-CM | POA: Diagnosis not present

## 2020-11-27 ENCOUNTER — Ambulatory Visit (INDEPENDENT_AMBULATORY_CARE_PROVIDER_SITE_OTHER): Payer: PPO | Admitting: Physician Assistant

## 2020-11-27 ENCOUNTER — Telehealth: Payer: Self-pay | Admitting: *Deleted

## 2020-11-27 ENCOUNTER — Encounter: Payer: Self-pay | Admitting: Physician Assistant

## 2020-11-27 VITALS — BP 160/72 | HR 55 | Ht 67.0 in | Wt 163.2 lb

## 2020-11-27 DIAGNOSIS — R1319 Other dysphagia: Secondary | ICD-10-CM | POA: Diagnosis not present

## 2020-11-27 DIAGNOSIS — R634 Abnormal weight loss: Secondary | ICD-10-CM

## 2020-11-27 DIAGNOSIS — R590 Localized enlarged lymph nodes: Secondary | ICD-10-CM | POA: Diagnosis not present

## 2020-11-27 DIAGNOSIS — Z7901 Long term (current) use of anticoagulants: Secondary | ICD-10-CM

## 2020-11-27 DIAGNOSIS — R9389 Abnormal findings on diagnostic imaging of other specified body structures: Secondary | ICD-10-CM

## 2020-11-27 NOTE — Telephone Encounter (Signed)
-----   Message from [place], [place] sent at 11/27/2020 11:41 AM EDT ----- Regarding: CT with contrast of the chest and abdomen Discussed with Dr. Fuller Plan, he would like to add a CT of the chest and abdomen with contrast for further evaluation given abnormal CT of the neck.  We did briefly discuss this during patient's visit today.  Tell him that Dr. Fuller Plan would like him to have this done ASAP, he described prior to barium swallow, but not sure when this was scheduled.  Definitely next week for imaging.  Thanks, JL L ----- Message ----- From: Ladene Artist, MD Sent: 11/27/2020  11:40 AM EDT To: Levin Erp, PA     ----- Message ----- From: Levin Erp, [place] Sent: 11/27/2020  11:02 AM EDT To: Ladene Artist, MD

## 2020-11-27 NOTE — Patient Instructions (Signed)
Continue Pantoprazole 20 mg daily.   You have been scheduled for a Barium Esophogram at [facility] (1st floor of the hospital) on Monday 11/30/20 at 11 am. Please arrive 15 minutes prior to your appointment for registration. Make certain not to have anything to eat or drink 3 hours prior to your test. If you need to reschedule for any reason, please contact radiology at [phone] to do so. __________________________________________________________________ A barium swallow is an examination that concentrates on views of the esophagus. This tends to be a double contrast exam (barium and two liquids which, when combined, create a gas to distend the wall of the oesophagus) or single contrast (non-ionic iodine based). The study is usually tailored to your symptoms so a good history is essential. Attention is paid during the study to the form, structure and configuration of the esophagus, looking for functional disorders (such as aspiration, dysphagia, achalasia, motility and reflux) EXAMINATION You may be asked to change into a gown, depending on the type of swallow being performed. A radiologist and radiographer will perform the procedure. The radiologist will advise you of the type of contrast selected for your procedure and direct you during the exam. You will be asked to stand, sit or lie in several different positions and to hold a small amount of fluid in your mouth before being asked to swallow while the imaging is performed .In some instances you may be asked to swallow barium coated marshmallows to assess the motility of a solid food bolus. The exam can be recorded as a digital or video fluoroscopy procedure. POST PROCEDURE It will take 1-2 days for the barium to pass through your system. To facilitate this, it is important, unless otherwise directed, to increase your fluids for the next 24-48hrs and to resume your normal diet.  This test typically takes about 30 minutes to  perform. ___________________________________________________________________________

## 2020-11-27 NOTE — Progress Notes (Signed)
Reviewed and agree with management plan. In addition schedule CT of chest, abdomen prior to barium esophagram. Proceed with ENT evaluation as planned and consider EGD pending results of CT and barium esophagram .   Pricilla Riffle. Fuller Plan, MD [facility]

## 2020-11-27 NOTE — Progress Notes (Signed)
Chief Complaint: Dysphagia, abnormal CT of the neck  HPI:    Mr. Whitecotton is a 80 year old Caucasian male, known to Dr. Fuller Plan, with a past medical history of CAD on Plavix, GERD, hypertension, MI and stroke with residual right-sided weakness, who was referred to me by [name], MD for a complaint of dysphagia and abnormal CT of the neck.    09/26/2012 colonoscopy for history of adenomatous polyps with removal of a 6 mm sessile polyp from the ascending colon and 4 mm polyp from the rectum, small internal hemorrhoids.  Pathology showed tubular adenoma.  Repeat recommended in 5 years.    10/22/2020 CT of the neck with contrast showed a 2.8 cm right level 2A lymph node, appeared to be centrally necrotic, concerning for metastatic disease, no additional lymphadenopathy or other acute process in the neck.    Today, the patient presents to clinic accompanied by his daughter who assists with history.  He tells me that his life completely changed 6-1/2 years ago when he had a stroke and was left with residual right-sided weakness as well as urinary incontinence and multiple other problems.  Tells me that most recently over the past 6 to 8 months he has started to have an increase in trouble when eating food.  Now it seems like almost anything he eats he will either have problems right away or at some point during the meal where it feels like food starts to fill up from the bottom up in his esophagus and his body will not let him eat anymore.  It becomes somewhat painful.  He will occasionally have sputum which comes up/liquid, but never the food he has eaten.  Typically he just stops eating and within a few hours symptoms seem to resolve.  Denies any heartburn or reflux.  Was recently started on Pantoprazole 20 mg daily by his PCP but has not seen any effect from this over the past week or so.  Associated symptoms include about a 10 pound weight loss over the past 6 months or so.    Also tells me that he  started feeling a lump come up on the right side of his neck about a month ago or so.  Apparently has been referred to ENT for further evaluation of this.    Retired Conservator, museum/gallery.    Denies fever, chills, change in bowel habits or abdominal pain.  Past Medical History:  Diagnosis Date   Anterior cerebral circulation hemorrhagic infarction [facility]) 11/24/2014   March, 2016, dominant left thalamic and left internal capsule ischemic infarct with resultant hemorrhagic transformation, secondary to small vessel disease. Resulted in right hemiparesis dysarthria and diplopia   //   readmission with aphasia July, 2016, this improved    Arthritis    CAD (coronary artery disease)    PCI distal RCA ...2004, residual 70% LAD   /   ...nuclear...03/2007...no ischemia.Marland KitchenMarland Kitchenpreserved LV /  nuclear...03/03/2009...inferior scar..no ischemia..EF 51%   Cyst of nasopharynx    per ENT Wilburn Cornelia   Dyslipidemia    takes Atorvastatin daily   GERD (gastroesophageal reflux disease)    takes Omeprazole daily   Gout    takes Allopurinol daily and Colchicine as needed   HCAP (healthcare-associated pneumonia)    Hemiparesis affecting right side as late effect of cerebrovascular accident ([place]) 09/05/2014   History of colon polyps    HTN (hypertension)    takes Cardura,Metoprolol,[name],and Amlodipine daily   Internal hemorrhoids    Myocardial infarction [facility]) 53yrs ago  Peripheral edema    takes Lasix daily   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 [name])    Past Surgical History:  Procedure Laterality Date   BALLOON ANGIOPLASTY, ARTERY  1992, 2004   CAD, Dr. Ron Parker   CARDIAC CATHETERIZATION  2004   CATARACT EXTRACTION Bilateral 2010   COLONOSCOPY  04/03/2002   adenomatous polyp, int  hemorrhoids   COLONOSCOPY  07/18/2007   normal (Dr. Fuller Plan)   COLONOSCOPY  09/26/2012   tubular adenoma, sm int hem, rpt 5 yrs Fuller Plan)   ELBOW SURGERY Right 1997   eye lids raised     NASAL SEPTUM SURGERY     POLYPECTOMY N/A 05/27/2013   Procedure: ENDOSCOPIC NASOPHARYNGEAL MASS;  Surgeon: [name], MD   ROTATOR CUFF REPAIR  09/2011   left, with subacromial decompression   TONSILLECTOMY AND ADENOIDECTOMY      Current Outpatient Medications  Medication Sig Dispense Refill   ACIDOPHILUS LACTOBACILLUS PO Take 2 tablets by mouth daily.     atorvastatin (LIPITOR) 80 MG tablet Take 80 mg by mouth at bedtime.     baclofen (LIORESAL) 10 MG tablet TAKE ONE TABLET BY MOUTH 3 TIMES A DAY AS NEEDED FOR SPASTICITY.     Cholecalciferol 25 MCG (1000 UT) tablet Take by mouth.     clopidogrel (PLAVIX) 75 MG tablet Take 1 tablet (75 mg total) by mouth daily.     empagliflozin (JARDIANCE) 25 MG TABS tablet Take 1 tablet by mouth daily.     fosinopril ([name]) 40 MG tablet Take 1 tablet (40 mg total) by mouth daily.     furosemide (LASIX) 20 MG tablet Take 1 tablet (20 mg total) by mouth daily AND 2 tablets (40 mg total) 3 (three) times a week. Every day 20mg , some days 40mg .     insulin aspart (NOVOLOG FLEXPEN) 100 UNIT/ML FlexPen Inject 10 Units into the skin in the morning and at bedtime. With meals     insulin glargine (LANTUS) 100 UNIT/ML Solostar Pen Inject 24 Units into the skin at bedtime.     LACTOBACILLUS PO Take 1 capsule by mouth daily. [facility].  Takes M/W/F     loratadine (CLARITIN) 10 MG tablet 1 tablet     metoprolol tartrate (LOPRESSOR) 25 MG tablet Take 1 tablet (25 mg total) by mouth 2 (two) times daily.     mupirocin cream (BACTROBAN) 2 % Apply 1 application topically 2 (two) times daily. 15 g 0   mupirocin ointment (BACTROBAN) 2 % APPLY TO AFFECTED AREA TWICE A DAY     pantoprazole (PROTONIX) 20 MG tablet TAKE 1 TABLET BY MOUTH EVERY DAY 30 tablet 1   Potassium 99 MG  TABS Take 1 tablet (99 mg total) by mouth daily as needed (when you take 2 lasix pills).     sertraline (ZOLOFT) 25 MG tablet Take 1 tablet (25 mg total) by mouth daily.     No current facility-administered medications for this visit.    Allergies as of 11/27/2020 - Review Complete 11/27/2020  Allergen Reaction Noted   Niacin Other (See Comments) 04/17/2006    Family History  Problem Relation Age of Onset   Alcohol abuse Father    Heart attack Father    Heart  attack Son    Coronary artery disease Neg Hx    Stroke Neg Hx    Cancer Neg Hx    Diabetes Neg Hx    Colon cancer Neg Hx     Social History   Socioeconomic History   Marital status: Married    Spouse name: Not on file   Number of children: 2   Years of education: Bachelors   Highest education level: Not on file  Occupational History   Occupation: Music therapist: [employer]  Tobacco Use   Smoking status: Former   Smokeless tobacco: Never   Tobacco comments:    quit smoking 45 years ago.  Vaping Use   Vaping Use: Never used  Substance and Sexual Activity   Alcohol use: No   Drug use: No   Sexual activity: Yes  Other Topics Concern   Not on file  Social History Narrative   Caffeine: 6-7 cups coffee   Lives with wife, no pets   2 grown children, 4 grandchildren   Occu: Artist   Edu: [place]   Activity: not much   Diet: plenty of water, fruits and vegetables   Some care through [place]      Endo: Dr. Buddy Duty   Social Determinants of Health   Financial Resource Strain: Low Risk    Difficulty of Paying Living Expenses: Not hard at all  Food Insecurity: No Food Insecurity   Worried About Charity fundraiser in the Last Year: Never true   [place] in the Last Year: Never true  Transportation Needs: No Transportation Needs   Lack of Transportation (Medical): No   Lack of Transportation (Non-Medical): No  Physical Activity: Sufficiently Active   Days of  Exercise per Week: 5 days   Minutes of Exercise per Session: 30 min  Stress: No Stress Concern Present   Feeling of Stress : Not at all  Social Connections: Not on file  Intimate Partner Violence: Not At Risk   Fear of Current or Ex-Partner: No   Emotionally Abused: No   Physically Abused: No   Sexually Abused: No    Review of Systems:    Constitutional: No fever or chills Skin: No rash  Cardiovascular: No chest pain Respiratory: No SOB Gastrointestinal: See HPI and otherwise negative Genitourinary: No dysuria  Neurological: No headache, dizziness or syncope Musculoskeletal: No new muscle or joint pain Hematologic: No bleeding  Psychiatric: No history of depression or anxiety   Physical Exam:  Vital signs: BP (!) 160/72   Pulse (!) 55   Ht 5\' 7"  (1.702 m)   Wt 163 lb 4 oz (74 kg)   BMI 25.57 kg/m    Constitutional:   Pleasant Caucasian male appears to be in NAD, Well developed, Well nourished, alert and cooperative Head:  Normocephalic and atraumatic. Eyes:   PEERL, EOMI. No icterus. Conjunctiva pink. Ears:  Normal auditory acuity. Neck:  Supple Throat: Oral cavity and pharynx without inflammation, swelling or lesion. + 1 inch in diameter swollen lymph node on right side of neck, nonpainful Respiratory: Respirations even and unlabored. Lungs clear to auscultation bilaterally.   No wheezes, crackles, or rhonchi.  Cardiovascular: Normal S1, S2. No MRG. Regular rate and rhythm. No peripheral edema, cyanosis or pallor.  Gastrointestinal:  Soft, nondistended, nontender. No rebound or guarding. Normal bowel sounds. No appreciable masses or hepatomegaly. Rectal:  Not performed.  Msk:  Symmetrical without gross deformities. Without edema, no deformity or  joint abnormality.  In wheelchair Neurologic:  Alert and  oriented x4;  grossly normal neurologically.  Skin:   Dry and intact without significant lesions or rashes. Psychiatric: Demonstrates good judgement and reason without  abnormal affect or behaviors.  RELEVANT LABS AND IMAGING: CBC    Component Value Date/Time   WBC 6.7 10/14/2020 1220   RBC 5.00 10/14/2020 1220   HGB 14.6 10/14/2020 1220   HCT 44.5 10/14/2020 1220   PLT 162.0 10/14/2020 1220   MCV 89.0 10/14/2020 1220   MCH 29.3 09/13/2014 1308   MCHC 32.7 10/14/2020 1220   RDW 13.8 10/14/2020 1220   LYMPHSABS 1.4 10/14/2020 1220   MONOABS 0.5 10/14/2020 1220   EOSABS 0.1 10/14/2020 1220   BASOSABS 0.1 10/14/2020 1220    CMP     Component Value Date/Time   NA 142 10/14/2020 1220   NA 143 07/26/2017 0000   K 3.8 10/14/2020 1220   K 3.6 02/01/2011 0000   K 3.6 02/01/2011 0000   CL 106 10/14/2020 1220   CL 102 04/19/2010 0000   CO2 29 10/14/2020 1220   CO2 28 04/19/2010 0000   GLUCOSE 94 10/14/2020 1220   BUN 25 (H) 10/14/2020 1220   BUN 12 07/14/2014 0000   CREATININE 0.97 10/14/2020 1220   CREATININE 1.24 09/30/2013 0000   CREATININE 1.24 09/30/2013 0000   CALCIUM 9.6 10/14/2020 1220   CALCIUM 9.7 02/01/2011 0000   CALCIUM 9.7 02/01/2011 0000   PROT 6.6 04/08/2020 1059   PROT 6.8 04/19/2010 0000   ALBUMIN 4.1 04/08/2020 1059   ALBUMIN 4.2 04/19/2010 0000   AST 17 04/08/2020 1059   AST 24 09/30/2013 0000   AST 24 09/30/2013 0000   ALT 23 04/08/2020 1059   ALT 34 09/30/2013 0000   ALT 34 09/30/2013 0000   ALKPHOS 94 04/08/2020 1059   ALKPHOS 132 09/30/2013 0000   ALKPHOS 132 09/30/2013 0000   BILITOT 1.0 04/08/2020 1059   BILITOT 1.0 09/30/2013 0000   BILITOT 1.0 09/30/2013 0000   GFRNONAA >60 09/13/2014 1308   GFRAA >60 09/13/2014 1308    Assessment: 1.  Dysphagia: Worsening over the past 6 to 8 months, abnormal CT showing suspicious lymph node in the neck; concern for underlying esophageal versus other cancer or stricture versus other 2.  Abnormal CT of the neck: Showing necrotic lymph node in the neck with concern for metastasis 3.  Status post stroke/CAD: On Plavix 4.  History of adenomatous polyps: Last colonoscopy  in 2014 with recommendations to repeat in 5 years 5.  Weight loss: Consider relation to possible cancer versus decrease in appetite/ability to eat food with dysphagia over the past 6 to 8 months  Plan: 1.  Scheduled patient for an urgent barium swallow with tablet.  Discussed that this will help [place] better delineate what is going on in his esophagus.  Pending this we will likely need to set the patient up for an EGD for further evaluation.  This will need to be completed in the [facility] and the patient will need to hold his Plavix for 5 days prior to time of procedure.  We would need to communicate with his prescribing physician to ensure this is acceptable for him.  (Of note patient has not seen cardiology in the past year) 2.  Did discuss briefly that could consider CT of the chest and abdomen given what looks like a metastatic lymph node in the patient's neck, will consider pending barium swallow as above. 3.  Continue  Pantoprazole 20 mg once daily 4.  We will call the patient with results from barium swallow and decide what needs to happen next.  Ellouise Newer, PA-C [facility] 11/27/2020, 10:02 AM  Cc: [name], MD

## 2020-11-27 NOTE — Telephone Encounter (Signed)
Scheduled CT at [place] on Tuesday 12/01/20 @ 1:30 PM. Patient will need to stop by [place] office for blood test on Monday 11/30/20. Left message for patient's daughter to call office.

## 2020-11-27 NOTE — Addendum Note (Signed)
Addended by: Horris Latino on: 11/27/2020 01:31 PM   Modules accepted: Orders

## 2020-11-30 ENCOUNTER — Other Ambulatory Visit: Payer: Self-pay

## 2020-11-30 ENCOUNTER — Other Ambulatory Visit (INDEPENDENT_AMBULATORY_CARE_PROVIDER_SITE_OTHER): Payer: PPO

## 2020-11-30 ENCOUNTER — Ambulatory Visit (HOSPITAL_COMMUNITY)
Admission: RE | Admit: 2020-11-30 | Discharge: 2020-11-30 | Disposition: A | Discharge status: Home / Self Care | Payer: PPO | Source: Ambulatory Visit | Attending: Physician Assistant | Admitting: Physician Assistant

## 2020-11-30 DIAGNOSIS — R1319 Other dysphagia: Secondary | ICD-10-CM | POA: Diagnosis not present

## 2020-11-30 DIAGNOSIS — R1310 Dysphagia, unspecified: Secondary | ICD-10-CM | POA: Diagnosis not present

## 2020-11-30 DIAGNOSIS — R634 Abnormal weight loss: Secondary | ICD-10-CM | POA: Diagnosis not present

## 2020-11-30 DIAGNOSIS — R9389 Abnormal findings on diagnostic imaging of other specified body structures: Secondary | ICD-10-CM

## 2020-11-30 DIAGNOSIS — Z7901 Long term (current) use of anticoagulants: Secondary | ICD-10-CM | POA: Diagnosis not present

## 2020-11-30 DIAGNOSIS — R590 Localized enlarged lymph nodes: Secondary | ICD-10-CM | POA: Diagnosis not present

## 2020-11-30 LAB — BASIC METABOLIC PANEL
BUN: 18 mg/dL (ref 6–23)
CO2: 32 mEq/L (ref 19–32)
Calcium: 9.5 mg/dL (ref 8.4–10.5)
Chloride: 103 mEq/L (ref 96–112)
Creatinine, Ser: 0.99 mg/dL (ref 0.40–1.50)
GFR: 73.29 mL/min (ref >60.00)
Glucose, Bld: 71 mg/dL (ref 70–99)
Potassium: 3.6 mEq/L (ref 3.5–5.1)
Sodium: 142 mEq/L (ref 135–145)

## 2020-11-30 NOTE — Telephone Encounter (Signed)
Delayed post - 11/27/20 spoke to patient's daughter and informed her of CT scan and blood test needed. They will stop by the clinic for the blood test and pick up contrast and instructions after his Barium esophagram on Monday 11/30/20.

## 2020-12-01 ENCOUNTER — Ambulatory Visit
Admission: RE | Admit: 2020-12-01 | Discharge: 2020-12-01 | Disposition: A | Discharge status: Home / Self Care | Payer: PPO | Source: Ambulatory Visit | Attending: Physician Assistant | Admitting: Physician Assistant

## 2020-12-01 DIAGNOSIS — R590 Localized enlarged lymph nodes: Secondary | ICD-10-CM

## 2020-12-01 DIAGNOSIS — R634 Abnormal weight loss: Secondary | ICD-10-CM

## 2020-12-01 DIAGNOSIS — R9389 Abnormal findings on diagnostic imaging of other specified body structures: Secondary | ICD-10-CM

## 2020-12-01 DIAGNOSIS — Z7901 Long term (current) use of anticoagulants: Secondary | ICD-10-CM

## 2020-12-01 DIAGNOSIS — R1319 Other dysphagia: Secondary | ICD-10-CM

## 2020-12-02 ENCOUNTER — Ambulatory Visit (INDEPENDENT_AMBULATORY_CARE_PROVIDER_SITE_OTHER): Payer: PPO | Admitting: Otolaryngology

## 2020-12-02 ENCOUNTER — Other Ambulatory Visit: Payer: Self-pay | Admitting: Family Medicine

## 2020-12-02 NOTE — Telephone Encounter (Signed)
According to our records, pt should have 1 refill available.    Spoke with [facility] asking about request.  They confirmed pt does have 1 available refill.   Request denied.

## 2020-12-04 ENCOUNTER — Ambulatory Visit: Payer: PPO

## 2020-12-08 ENCOUNTER — Other Ambulatory Visit: Payer: Self-pay

## 2020-12-08 ENCOUNTER — Ambulatory Visit (INDEPENDENT_AMBULATORY_CARE_PROVIDER_SITE_OTHER)
Admission: RE | Admit: 2020-12-08 | Discharge: 2020-12-08 | Disposition: A | Discharge status: Home / Self Care | Payer: PPO | Source: Ambulatory Visit | Attending: Physician Assistant | Admitting: Physician Assistant

## 2020-12-08 ENCOUNTER — Ambulatory Visit
Admission: RE | Admit: 2020-12-08 | Discharge: 2020-12-08 | Disposition: A | Discharge status: Home / Self Care | Payer: PPO | Source: Ambulatory Visit | Attending: Physician Assistant | Admitting: Physician Assistant

## 2020-12-08 DIAGNOSIS — J439 Emphysema, unspecified: Secondary | ICD-10-CM | POA: Diagnosis not present

## 2020-12-08 DIAGNOSIS — R9389 Abnormal findings on diagnostic imaging of other specified body structures: Secondary | ICD-10-CM

## 2020-12-08 DIAGNOSIS — R634 Abnormal weight loss: Secondary | ICD-10-CM

## 2020-12-08 DIAGNOSIS — I700 Atherosclerosis of aorta: Secondary | ICD-10-CM | POA: Diagnosis not present

## 2020-12-08 DIAGNOSIS — K8020 Calculus of gallbladder without cholecystitis without obstruction: Secondary | ICD-10-CM | POA: Diagnosis not present

## 2020-12-08 DIAGNOSIS — R1310 Dysphagia, unspecified: Secondary | ICD-10-CM | POA: Diagnosis not present

## 2020-12-08 DIAGNOSIS — R590 Localized enlarged lymph nodes: Secondary | ICD-10-CM

## 2020-12-08 MED ORDER — IOHEXOL 350 MG/ML SOLN
100.0000 mL | Freq: Once | INTRAVENOUS | Status: AC | PRN
  Administered 2020-12-08: 80 mL via INTRAVENOUS

## 2020-12-10 ENCOUNTER — Ambulatory Visit (HOSPITAL_BASED_OUTPATIENT_CLINIC_OR_DEPARTMENT_OTHER): Payer: PPO | Admitting: Cardiology

## 2020-12-10 ENCOUNTER — Telehealth: Payer: Self-pay | Admitting: Physician Assistant

## 2020-12-10 ENCOUNTER — Telehealth: Payer: Self-pay

## 2020-12-10 DIAGNOSIS — R1319 Other dysphagia: Secondary | ICD-10-CM

## 2020-12-10 DIAGNOSIS — K2289 Other specified disease of esophagus: Secondary | ICD-10-CM

## 2020-12-10 DIAGNOSIS — R634 Abnormal weight loss: Secondary | ICD-10-CM

## 2020-12-10 DIAGNOSIS — R9389 Abnormal findings on diagnostic imaging of other specified body structures: Secondary | ICD-10-CM

## 2020-12-10 DIAGNOSIS — Z7901 Long term (current) use of anticoagulants: Secondary | ICD-10-CM

## 2020-12-10 NOTE — Telephone Encounter (Signed)
   Jeffrey [name] 1940-05-11 [id number]  Dear Dr. Danise Mina:  We have scheduled the above named patient for an endoscopic procedure. Our records show that he is on anticoagulation therapy.  Please advise as to whether the patient may come off their therapy of Plavix 5 days prior to their procedure which is scheduled for Thursday, 12/17/20.  Please route your response to Gerre Couch, RN or fax response to [phone].  Sincerely,    [facility]

## 2020-12-10 NOTE — Telephone Encounter (Signed)
Telephone note:  12/10/2020 9:00 AM  Discussed with patient's daughter and POA Alyse Low that the results of his CT show a likely esophageal mass which needs to be evaluated further with an EGD.  Given that he is on Plavix will need to hold this for 5 days in order for [place] to get a biopsy.  I will have my nurse Herbert Seta (whom I have already talked to about this) call her within the next hour to arrange for an EGD hopefully next week.  Answered all of her questions.  Ellouise Newer, PA-C

## 2020-12-10 NOTE — Addendum Note (Signed)
Addended by: Yevette Edwards on: 12/10/2020 11:33 AM   Modules accepted: Orders

## 2020-12-10 NOTE — Telephone Encounter (Signed)
Spoke with [name] in regards to patient's appt. Patient is scheduled for an EGD with Dr. Fuller Plan in the [facility] on Thursday, 12/17/20 at 2 PM. [name] is aware that patient will need to arrive at 1 PM on the 4th floor with a care partner. I have assisted [name] to get access to patient's my chart, she is aware that his instructions will be available under the "Letter" tab. Bennetta Laos to let us know if she has any other questions or concerns. She verbalized understanding of all information and had no concerns at the end of the call.  Ambulatory referral to GI in epic. Instructions sent to [facility] via patient's my chart. See alternate phone note for cardiac clearance.

## 2020-12-10 NOTE — Telephone Encounter (Signed)
Jeffrey [name] has been notified of Plavix hold and recommendations of Aspirin EC 81 mg while off of Plavix. I have added this information to patient's instructions as well. Jeffrey [name] verbalized understanding and had no concerns at the end of the call.

## 2020-12-10 NOTE — Telephone Encounter (Signed)
Blood thinner: plavix 75mg  daily  Indication: h/o CAD, PVD, L thalamic/internal capsule ischemic CVA 05/2014 with residual R sided hemiparesis Needs antiplatelet agent held for EGD with biopsy for esophageal mass.   Estimated to be at at low thromboembolic risk. Ok to hold plavix 5 days, restart once hemostasis achieved. Would see if Dr Fuller Plan would be ok with patient taking aspirin 81mg  while plavix held.

## 2020-12-10 NOTE — Telephone Encounter (Signed)
OK to take [facility] ASA 81 mg qd while off Plavix.

## 2020-12-14 ENCOUNTER — Telehealth: Payer: Self-pay | Admitting: Gastroenterology

## 2020-12-14 NOTE — Telephone Encounter (Signed)
Patient's daughter, [name], called needing clarification on instructions for upcoming procedure.  Also had other questions that needed to be answered.  Please call her.  Thank you.

## 2020-12-14 NOTE — Telephone Encounter (Signed)
Spoke with Jeffrey [name], we have reviewed prep instructions verbatim. She just wanted to make sure she had everything clear before the procedure. I have answered all of Jeffrey [name]'s questions, she verbalized understanding and had no concerns at the end of the call.

## 2020-12-15 DIAGNOSIS — K9289 Other specified diseases of the digestive system: Secondary | ICD-10-CM | POA: Diagnosis not present

## 2020-12-15 DIAGNOSIS — J3489 Other specified disorders of nose and nasal sinuses: Secondary | ICD-10-CM | POA: Diagnosis not present

## 2020-12-15 DIAGNOSIS — K219 Gastro-esophageal reflux disease without esophagitis: Secondary | ICD-10-CM | POA: Diagnosis not present

## 2020-12-15 DIAGNOSIS — Z87891 Personal history of nicotine dependence: Secondary | ICD-10-CM | POA: Diagnosis not present

## 2020-12-15 DIAGNOSIS — R590 Localized enlarged lymph nodes: Secondary | ICD-10-CM | POA: Diagnosis not present

## 2020-12-16 ENCOUNTER — Encounter: Payer: PPO | Admitting: Gastroenterology

## 2020-12-16 NOTE — Telephone Encounter (Signed)
Patients daughter [name] called requesting to speak with you again about what she can give the patient in the event his blood levels drop.

## 2020-12-16 NOTE — Telephone Encounter (Signed)
Spoke with Jeffrey [name], she wanted to make sure patient's sugar won't drop. Advised that he should be drinking clear liquids with sugar in them to help balance. Advised that he will be holding diabetic medications tomorrow and his blood sugar will be checked when he gets here and after. Jeffrey [name] verbalized understanding and had no concerns at the end of the call.

## 2020-12-17 ENCOUNTER — Ambulatory Visit (AMBULATORY_SURGERY_CENTER): Payer: PPO | Admitting: Gastroenterology

## 2020-12-17 ENCOUNTER — Encounter: Payer: Self-pay | Admitting: Gastroenterology

## 2020-12-17 ENCOUNTER — Telehealth: Payer: Self-pay

## 2020-12-17 ENCOUNTER — Other Ambulatory Visit: Payer: Self-pay

## 2020-12-17 VITALS — BP 161/86 | HR 70 | Temp 97.1°F | Resp 14 | Ht 67.0 in | Wt 163.0 lb

## 2020-12-17 DIAGNOSIS — C169 Malignant neoplasm of stomach, unspecified: Secondary | ICD-10-CM

## 2020-12-17 DIAGNOSIS — K222 Esophageal obstruction: Secondary | ICD-10-CM

## 2020-12-17 DIAGNOSIS — R1310 Dysphagia, unspecified: Secondary | ICD-10-CM

## 2020-12-17 DIAGNOSIS — C160 Malignant neoplasm of cardia: Secondary | ICD-10-CM | POA: Diagnosis not present

## 2020-12-17 DIAGNOSIS — R9389 Abnormal findings on diagnostic imaging of other specified body structures: Secondary | ICD-10-CM

## 2020-12-17 DIAGNOSIS — K2289 Other specified disease of esophagus: Secondary | ICD-10-CM

## 2020-12-17 DIAGNOSIS — C159 Malignant neoplasm of esophagus, unspecified: Secondary | ICD-10-CM | POA: Diagnosis not present

## 2020-12-17 MED ORDER — SODIUM CHLORIDE 0.9 % IV SOLN: 500.0000 mL | Freq: Once | INTRAVENOUS | Status: DC

## 2020-12-17 NOTE — Op Note (Signed)
[facility] Patient Name: Jeffrey [name] Procedure Date: 12/17/2020 1:50 PM MRN: [id number] Endoscopist: Ladene Artist , MD Age: 80 Referring MD:  Date of Birth: 1940/10/22 Gender: Male Account #: [id number] Procedure:                Upper GI endoscopy Indications:              Dysphagia, Abnormal CT of the GI tract Medicines:                Monitored Anesthesia Care Procedure:                Pre-Anesthesia Assessment:                           - Prior to the procedure, a History and Physical                            was performed, and patient medications and                            allergies were reviewed. The patient's tolerance of                            previous anesthesia was also reviewed. The risks                            and benefits of the procedure and the sedation                            options and risks were discussed with the patient.                            All questions were answered, and informed consent                            was obtained. Prior Anticoagulants: The patient has                            taken Plavix (clopidogrel), last dose was 5 days                            prior to procedure. ASA Grade Assessment: III - A                            patient with severe systemic disease. After                            reviewing the risks and benefits, the patient was                            deemed in satisfactory condition to undergo the                            procedure.  After obtaining informed consent, the endoscope was                            passed under direct vision. Throughout the                            procedure, the patient's blood pressure, pulse, and                            oxygen saturations were monitored continuously. The                            Endoscope was introduced through the mouth, and                            advanced to the second part of duodenum. The upper                             GI endoscopy was accomplished without difficulty.                            The patient tolerated the procedure well. Scope In: Scope Out: Findings:                 A medium-sized, fungating mass with contact                            bleeding was found in the distal esophagus, 36 cm                            from the incisors extending to 41 cm in the cardia.                            The mass was partially obstructing and                            circumferential. Biopsies were taken with a cold                            forceps for histology.                           The exam of the esophagus was otherwise normal.                           A medium-sized, fungating, circumferential mass                            with oozing contact bleeding was found in the                            cardia. Biopsies were taken with a cold forceps for  histology.                           The exam of the stomach was otherwise normal.                           The duodenal bulb and second portion of the                            duodenum were normal. Complications:            No immediate complications. Estimated Blood Loss:     Estimated blood loss was minimal. Impression:               - Partially obstructing, malignant esophageal tumor                            was found in the distal esophagus. Biopsied.                           - Malignant gastric tumor in the cardia. Biopsied.                           - Normal duodenal bulb and second portion of the                            duodenum. Recommendation:           - Patient has a contact number available for                            emergencies. The signs and symptoms of potential                            delayed complications were discussed with the                            patient. Return to normal activities tomorrow.                            Written discharge instructions  were provided to the                            patient.                           - Soft diet.                           - Continue present medications.                           - Await pathology results.                           - Resume Plavix (clopidogrel) at prior dose  tomorrow. Refer to managing physician for further                            adjustment of therapy.                           - Refer to an oncologist at the next available                            appointment. Ladene Artist, MD 12/17/2020 2:22:00 PM This report has been signed electronically.

## 2020-12-17 NOTE — Patient Instructions (Addendum)
Stay on a soft diet. Your sugar was 97 in the recovery room.  Per Dr. Fuller Plan, you may resume your PLAVIX at prior dose tomorrow. You may resume your other current medications today. Await biopsy results.  Dr. Fuller Plan will call you with the biopsy results. The office will call to send a refer to an oncologist at the next available appointment. Please call if any questions or concerns.     YOU HAD AN ENDOSCOPIC PROCEDURE TODAY AT [facility]:   Refer to the procedure report that was given to you for any specific questions about what was found during the examination.  If the procedure report does not answer your questions, please call your gastroenterologist to clarify.  If you requested that your care partner not be given the details of your procedure findings, then the procedure report has been included in a sealed envelope for you to review at your convenience later.  YOU SHOULD EXPECT: Some feelings of bloating in the abdomen. Passage of more gas than usual.  Walking can help get rid of the air that was put into your GI tract during the procedure and reduce the bloating. If you had a lower endoscopy (such as a colonoscopy or flexible sigmoidoscopy) you may notice spotting of blood in your stool or on the toilet paper. If you underwent a bowel prep for your procedure, you may not have a normal bowel movement for a few days.  Please Note:  You might notice some irritation and congestion in your nose or some drainage.  This is from the oxygen used during your procedure.  There is no need for concern and it should clear up in a day or so.  SYMPTOMS TO REPORT IMMEDIATELY:  Following upper endoscopy (EGD)  Vomiting of blood or coffee ground material  New chest pain or pain under the shoulder blades  Painful or persistently difficult swallowing  New shortness of breath  Fever of 100F or higher  Black, tarry-looking stools  For urgent or emergent issues, a gastroenterologist can be  reached at any hour by calling [phone]. Do not use MyChart messaging for urgent concerns.    DIET:  We do recommend to stay on a soft diet.   Drink plenty of fluids but you should avoid alcoholic beverages for 24 hours.  ACTIVITY:  You should plan to take it easy for the rest of today and you should NOT DRIVE or use heavy machinery until tomorrow (because of the sedation medicines used during the test).    FOLLOW UP: Our staff will call the number listed on your records 48-72 hours following your procedure to check on you and address any questions or concerns that you may have regarding the information given to you following your procedure. If we do not reach you, we will leave a message.  We will attempt to reach you two times.  During this call, we will ask if you have developed any symptoms of COVID 19. If you develop any symptoms (ie: fever, flu-like symptoms, shortness of breath, cough etc.) before then, please call [phone].  If you test positive for Covid 19 in the 2 weeks post procedure, please call and report this information to [place].    If any biopsies were taken you will be contacted by phone or by letter within the next 1-3 weeks.  Please call us at [phone] if you have not heard about the biopsies in 3 weeks.    SIGNATURES/CONFIDENTIALITY: You and/or your care  partner have signed paperwork which will be entered into your electronic medical record.  These signatures attest to the fact that that the information above on your After Visit Summary has been reviewed and is understood.  Full responsibility of the confidentiality of this discharge information lies with you and/or your care-partner.

## 2020-12-17 NOTE — Telephone Encounter (Signed)
Amb referral placed to oncology. Left message for patient to call back to discuss .     Jeffrey [name].  Referral for esophageal mass

## 2020-12-17 NOTE — Telephone Encounter (Signed)
-----   Message from Ladene Artist, MD sent at 12/17/2020  2:34 PM EDT ----- Newly diagnosed esophageal cancer. See EGD today. Needs a GI oncology appt ASAP. Thx.

## 2020-12-17 NOTE — Progress Notes (Signed)
I assisted pt with using the urnal and assisted pt dressing.    No problems noted in the recovery room. maw

## 2020-12-17 NOTE — Progress Notes (Signed)
Called to room to assist during endoscopic procedure.  Patient ID and intended procedure confirmed with present staff. Received instructions for my participation in the procedure from the performing physician.  

## 2020-12-17 NOTE — Progress Notes (Signed)
Sedate, gd SR, tolerated procedure well, VSS, report to RN 

## 2020-12-17 NOTE — Telephone Encounter (Signed)
Patient's daughter notified that they will be contacted by [facility] directly with an appointment date and time.

## 2020-12-18 ENCOUNTER — Telehealth: Payer: Self-pay | Admitting: Nurse Practitioner

## 2020-12-18 NOTE — Telephone Encounter (Signed)
Scheduled appt per 10/13 referral. Pt's daughter is aware of appt date and time. She is aware to arrive 15 mins early to appt and bring updated insurance card.

## 2020-12-19 ENCOUNTER — Encounter: Payer: Self-pay | Admitting: Family Medicine

## 2020-12-19 DIAGNOSIS — C159 Malignant neoplasm of esophagus, unspecified: Secondary | ICD-10-CM | POA: Insufficient documentation

## 2020-12-21 ENCOUNTER — Telehealth: Payer: Self-pay | Admitting: *Deleted

## 2020-12-21 NOTE — Telephone Encounter (Signed)
  Follow up Call-  Call back number 12/17/2020  Post procedure Call Back phone  # [phone]  Permission to leave phone message Yes  Some recent data might be hidden     Patient questions:  Do you have a fever, pain , or abdominal swelling? No. Pain Score  0 *  Have you tolerated food without any problems? Yes.    Have you been able to return to your normal activities? Yes.    Do you have any questions about your discharge instructions: Diet   No. Medications  No. Follow up visit  No.  Do you have questions or concerns about your Care? No.  Actions: * If pain score is 4 or above: No action needed, pain <4.  Have you developed a fever since your procedure? no  2.   Have you had an respiratory symptoms (SOB or cough) since your procedure? no  3.   Have you tested positive for COVID 19 since your procedure no  4.   Have you had any family members/close contacts diagnosed with the COVID 19 since your procedure?  no   If yes to any of these questions please route to Joylene John, RN and Joella Prince, RN

## 2020-12-24 ENCOUNTER — Encounter: Payer: Self-pay | Admitting: Nurse Practitioner

## 2020-12-24 ENCOUNTER — Inpatient Hospital Stay: Payer: PPO | Attending: Nurse Practitioner | Admitting: Nurse Practitioner

## 2020-12-24 ENCOUNTER — Other Ambulatory Visit: Payer: Self-pay

## 2020-12-24 ENCOUNTER — Inpatient Hospital Stay: Payer: PPO

## 2020-12-24 VITALS — BP 154/81 | HR 57 | Temp 98.6°F | Resp 16 | Ht 67.0 in | Wt 161.2 lb

## 2020-12-24 DIAGNOSIS — C155 Malignant neoplasm of lower third of esophagus: Secondary | ICD-10-CM

## 2020-12-24 DIAGNOSIS — C160 Malignant neoplasm of cardia: Secondary | ICD-10-CM | POA: Diagnosis not present

## 2020-12-24 DIAGNOSIS — R634 Abnormal weight loss: Secondary | ICD-10-CM | POA: Diagnosis not present

## 2020-12-24 DIAGNOSIS — R1310 Dysphagia, unspecified: Secondary | ICD-10-CM | POA: Diagnosis not present

## 2020-12-24 DIAGNOSIS — Z87891 Personal history of nicotine dependence: Secondary | ICD-10-CM | POA: Diagnosis not present

## 2020-12-24 LAB — CBC WITH DIFFERENTIAL (CANCER CENTER ONLY)
Abs Immature Granulocytes: 0.02 10*3/uL (ref 0.00–0.07)
Basophils Absolute: 0.1 10*3/uL (ref 0.0–0.1)
Basophils Relative: 1 %
Eosinophils Absolute: 0.1 10*3/uL (ref 0.0–0.5)
Eosinophils Relative: 2 %
HCT: 46.6 % (ref 39.0–52.0)
Hemoglobin: 15.6 g/dL (ref 13.0–17.0)
Immature Granulocytes: 0 %
Lymphocytes Relative: 18 %
Lymphs Abs: 1.2 10*3/uL (ref 0.7–4.0)
MCH: 29.3 pg (ref 26.0–34.0)
MCHC: 33.5 g/dL (ref 30.0–36.0)
MCV: 87.4 fL (ref 80.0–100.0)
Monocytes Absolute: 0.5 10*3/uL (ref 0.1–1.0)
Monocytes Relative: 7 %
Neutro Abs: 5 10*3/uL (ref 1.7–7.7)
Neutrophils Relative %: 72 %
Platelet Count: 163 10*3/uL (ref 150–400)
RBC: 5.33 MIL/uL (ref 4.22–5.81)
RDW: 13.2 % (ref 11.5–15.5)
WBC Count: 6.9 10*3/uL (ref 4.0–10.5)
nRBC: 0 % (ref 0.0–0.2)

## 2020-12-24 LAB — CMP (CANCER CENTER ONLY)
ALT: 25 U/L (ref 0–44)
AST: 23 U/L (ref 15–41)
Albumin: 4.3 g/dL (ref 3.5–5.0)
Alkaline Phosphatase: 107 U/L (ref 38–126)
Anion gap: 10 (ref 5–15)
BUN: 18 mg/dL (ref 8–23)
CO2: 26 mmol/L (ref 22–32)
Calcium: 9.6 mg/dL (ref 8.9–10.3)
Chloride: 104 mmol/L (ref 98–111)
Creatinine: 1.07 mg/dL (ref 0.61–1.24)
GFR, Estimated: >60 mL/min (ref >60)
Glucose, Bld: 85 mg/dL (ref 70–99)
Potassium: 3.7 mmol/L (ref 3.5–5.1)
Sodium: 140 mmol/L (ref 135–145)
Total Bilirubin: 1.2 mg/dL (ref 0.3–1.2)
Total Protein: 7.5 g/dL (ref 6.5–8.1)

## 2020-12-24 NOTE — Progress Notes (Signed)
I met Mr Fowle and his daughter [name] during his consultation with Cira Rue and Dr Burr Medico.  I explained my role as nurse navigator.  I reviewed services provided at [facility].  All questions were answered.  They verbalized understanding.

## 2020-12-24 NOTE — Progress Notes (Addendum)
[place]   Telephone:([phone]) [phone] Fax:([phone]) [place] Note   Patient Care Team: [name], MD as PCP - General (Family Medicine) Jerline Pain, MD as PCP - Cardiology (Cardiology) Delia Chimes, NP (Inactive) as Nurse Practitioner (Nurse Practitioner) Ladene Artist, MD as Consulting Physician (Gastroenterology) Alla Feeling, NP as Nurse Practitioner (Nurse Practitioner) Truitt Merle, MD as Consulting Physician (Hematology) Truitt Merle, MD as Consulting Physician (Oncology) 12/24/2020  CHIEF COMPLAINTS/PURPOSE OF CONSULTATION:  Esophageal cancer, referred by GI Dr. Lucio Edward  SUMMARY OF ONCOLOGY HISTORY Oncology History  Esophageal cancer ([place])  10/22/2020 Imaging   CT neck IMPRESSION: 2.8 cm right level 2A lymph node, which appears to be centrally necrotic, concerning for metastatic disease. No additional lymphadenopathy or other acute process in the neck.     11/30/2020 Imaging   Barium swallow FINDINGS: Limitations on positioning due to sequelae of stroke. The patient swallowed barium without difficulty. No aspiration. There is no mass or stricture. Normal motility. There is no hiatal hernia. No spontaneous gastroesophageal reflux. Barium pill was swallowed and passed without difficulty.   IMPRESSION: No significant abnormality.   12/08/2020 Imaging   CT chest abdomen with contrast IMPRESSION: 1. Mass is suspected just at the level of the GE junction. Suspicious for primary esophageal neoplasm. Further evaluation with endoscopy is advised. 2. Prominent perigastric lymph nodes are identified, suspicious for metastatic adenopathy. 3. Gallstone. 4. Coronary artery calcifications. 5. Aortic Atherosclerosis (ICD10-I70.0) and Emphysema (ICD10-J43.9). 6. These results will be called to the ordering clinician or representative by the Radiologist Assistant, and communication documented in the PACS or [employer].      12/17/2020 Procedure   EGD by Dr. Fuller Plan - A medium-sized, fungating mass with contact bleeding was found in the distal esophagus, 36 cm from the incisors extending to 41 cm in the cardia. The mass was partially obstructing and circumferential.   12/17/2020 Initial Biopsy   Diagnosis Esophagus, biopsy, distal and gastric cardia mass - ADENOCARCINOMA WITH SIGNET RING FEATURES.   12/19/2020 Initial Diagnosis   Esophageal cancer ([facility])     HISTORY OF PRESENTING ILLNESS:  Jeffrey [name] 80 y.o. male with past medical history of HTN, DM, HL, CAD, CVA, and diastolic heart failure NYHA class I is here because of newly diagnosed esophageal cancer.  The patient noticed right cervical adenopathy and developed dysphagia about 6 months ago, PCP began work-up including a CT of the neck on 10/22/2020 which showed enlarged hypoenhancing right level 2A lymph node measuring 2.8 x 1.6 x 2.7 cm which was present back in 2015 measuring 0.9 x 0.7 x 1.3 cm at that time.  He was then referred to GI for further work-up including barium swallow 11/30/2020 which showed normal motility, no aspiration, mass, or stricture overall negative.  He underwent CT of the chest and abdomen with contrast 12/08/2020 which showed a mass at the GE junction measuring 3.8 x 4.2 cm with prominent perigastric lymph nodes measuring 1 cm concerning for primary GE junction malignancy with nodal metastasis.  He underwent diagnostic EGD by Dr. Fuller Plan on 12/17/2020 which showed a fungating mass with contact bleeding in the distal esophagus extending to the cardia which was partially obstructing and circumferential, biopsy path showed adenocarcinoma with signet ring features.  Socially, patient lives in [place].  He is married but his wife lives in a facility with dementia, he lives alone.  He has 2 children, a daughter and son, daughter visits him often and helps  around the house.  Patient is limited in ADLs due to history of stroke and residual  right side weakness/paralysis.  He has a brief history of tobacco and alcohol use but quit at age 37 family history is significant for a sister who had breast cancer age 36 and is deceased.  Today, Jeffrey [name] presents with his daughter.  He is in a wheelchair.  He developed mild dysphagia 6-8 months ago that has become worse in the last 1-2 months.  This is moderate now, he tolerates most soft foods, if he eats meat it has to be cut in tiny pieces.  He tolerates liquids and medications well.  He has mild discomfort with swallowing without significant pain. He has lost 10-15 lbs in 6-8 months.  He occasionally gags but does not regurgitate much.  In the last month bowels have become clear and watery throughout the day and night.  Denies abdominal pain, bloating, early satiety.  He has throat clearing without overt cough, no chest pain or dyspnea.  2 months ago he felt a right cervical lymph node, denies recent ear infection, tooth ache, URI, or other infection.  MEDICAL HISTORY:  Past Medical History:  Diagnosis Date   Anterior cerebral circulation hemorrhagic infarction [facility]) 11/24/2014   March, 2016, dominant left thalamic and left internal capsule ischemic infarct with resultant hemorrhagic transformation, secondary to small vessel disease. Resulted in right hemiparesis dysarthria and diplopia   //   readmission with aphasia July, 2016, this improved    Arthritis    CAD (coronary artery disease)    PCI distal RCA ...2004, residual 70% LAD   /   ...nuclear...03/2007...no ischemia.Marland KitchenMarland Kitchenpreserved LV /  nuclear...03/03/2009...inferior scar..no ischemia..EF 51%   Cyst of nasopharynx    per ENT Wilburn Cornelia   Dyslipidemia    takes Atorvastatin daily   GERD (gastroesophageal reflux disease)    takes Omeprazole daily   Gout    takes Allopurinol daily and Colchicine as needed   HCAP (healthcare-associated pneumonia)    Hemiparesis affecting right side as late effect of cerebrovascular accident ([place]) 09/05/2014    History of colon polyps    HTN (hypertension)    takes Cardura,Metoprolol,[name],and Amlodipine daily   Internal hemorrhoids    Myocardial infarction [facility]) 43yr ago   Peripheral edema    takes Lasix daily   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    SURGICAL HISTORY: Past Surgical History:  Procedure Laterality Date   BALLOON ANGIOPLASTY, ARTERY  1992, 2004   CAD, Dr. KRon Parker  CARDIAC CATHETERIZATION  2004   CATARACT EXTRACTION Bilateral 2010   COLONOSCOPY  04/03/2002   adenomatous polyp, int hemorrhoids   COLONOSCOPY  07/18/2007   normal (Dr. SFuller Plan   COLONOSCOPY  09/26/2012   tubular adenoma, sm int hem, rpt 5 yrs (Fuller Plan   ELBOW SURGERY Right 1997   eye lids raised     NASAL SEPTUM SURGERY     POLYPECTOMY N/A 05/27/2013   Procedure: ENDOSCOPIC NASOPHARYNGEAL MASS;  Surgeon: DJerrell Belfast MD   ROTATOR CUFF REPAIR  09/2011   left, with subacromial decompression   TONSILLECTOMY AND ADENOIDECTOMY      SOCIAL HISTORY: Social History   Socioeconomic History   Marital status: Married    Spouse  name: Not on file   Number of children: 2   Years of education: Bachelors   Highest education level: Not on file  Occupational History   Occupation: Music therapist: [employer]  Tobacco Use   Smoking status: Former   Smokeless tobacco: Never   Tobacco comments:    quit smoking at age 70  Vaping Use   Vaping Use: Never used  Substance and Sexual Activity   Alcohol use: No   Drug use: No   Sexual activity: Yes  Other Topics Concern   Not on file  Social History Narrative   Caffeine: 6-7 cups coffee   Lives with wife, no pets   2 grown children, 4 grandchildren   Occu: Artist   Edu: [employer]    Activity: not much   Diet: plenty of water, fruits and vegetables   Some care through [place]      Endo: Dr. Buddy Duty   Social Determinants of Health   Financial Resource Strain: Low Risk    Difficulty of Paying Living Expenses: Not hard at all  Food Insecurity: No Food Insecurity   Worried About Charity fundraiser in the Last Year: Never true   [place] in the Last Year: Never true  Transportation Needs: No Transportation Needs   Lack of Transportation (Medical): No   Lack of Transportation (Non-Medical): No  Physical Activity: Sufficiently Active   Days of Exercise per Week: 5 days   Minutes of Exercise per Session: 30 min  Stress: No Stress Concern Present   Feeling of Stress : Not at all  Social Connections: Not on file  Intimate Partner Violence: Not At Risk   Fear of Current or Ex-Partner: No   Emotionally Abused: No   Physically Abused: No   Sexually Abused: No    FAMILY HISTORY: Family History  Problem Relation Age of Onset   Alcohol abuse Father    Heart attack Father    Cancer Sister 65       breast cancer   Heart attack Son    Coronary artery disease Neg Hx    Stroke Neg Hx    Diabetes Neg Hx    Colon cancer Neg Hx     ALLERGIES:  is allergic to niacin.  MEDICATIONS:  Current Outpatient Medications  Medication Sig Dispense Refill   ACIDOPHILUS LACTOBACILLUS PO Take 2 tablets by mouth daily.     atorvastatin (LIPITOR) 80 MG tablet Take 80 mg by mouth at bedtime.     baclofen (LIORESAL) 10 MG tablet TAKE ONE TABLET BY MOUTH 3 TIMES A DAY AS NEEDED FOR SPASTICITY.     Cholecalciferol 25 MCG (1000 UT) tablet Take by mouth.     clopidogrel (PLAVIX) 75 MG tablet Take 1 tablet (75 mg total) by mouth daily.     empagliflozin (JARDIANCE) 25 MG TABS tablet Take 1 tablet by mouth daily.     fosinopril ([name]) 40 MG tablet Take 1 tablet (40 mg total) by mouth daily.     furosemide (LASIX) 20 MG tablet Take 1 tablet (20 mg total) by mouth daily AND 2 tablets (40  mg total) 3 (three) times a week. Every day 68m, some days 482m     insulin aspart (NOVOLOG FLEXPEN) 100 UNIT/ML FlexPen Inject 10 Units into the skin in the morning and at bedtime. With meals     insulin glargine (LANTUS) 100 UNIT/ML Solostar Pen Inject 24 Units into the skin at  bedtime.     LACTOBACILLUS PO Take 1 capsule by mouth daily. [facility] colon [facility].  Takes M/W/F     loratadine (CLARITIN) 10 MG tablet 1 tablet     metoprolol tartrate (LOPRESSOR) 25 MG tablet Take 1 tablet (25 mg total) by mouth 2 (two) times daily.     mupirocin cream (BACTROBAN) 2 % Apply 1 application topically 2 (two) times daily. (Patient not taking: Reported on 12/17/2020) 15 g 0   mupirocin ointment (BACTROBAN) 2 % APPLY TO AFFECTED AREA TWICE A DAY (Patient not taking: Reported on 12/17/2020)     pantoprazole (PROTONIX) 20 MG tablet TAKE 1 TABLET BY MOUTH EVERY DAY 30 tablet 1   Potassium 99 MG TABS Take 1 tablet (99 mg total) by mouth daily as needed (when you take 2 lasix pills).     sertraline (ZOLOFT) 25 MG tablet Take 1 tablet (25 mg total) by mouth daily.     No current facility-administered medications for this visit.    REVIEW OF SYSTEMS:   Constitutional: Denies fevers, chills or abnormal night sweats (+) 10-15 lbs weight loss in 6-8 months  Eyes: Denies blurriness of vision, double vision or watery eyes Ears, nose, mouth, throat, and face: Denies mucositis or sore throat (+) throat irritation from EGD Respiratory: Denies cough, dyspnea or wheezes Cardiovascular: Denies palpitation, chest discomfort or lower extremity swelling Gastrointestinal:  Denies nausea, vomiting, constipation, heartburn or change in bowel habits (+) dysphagia (+) diarrhea (+) occasional gagging  Skin: Denies abnormal skin rashes Lymphatics: Denies new lymphadenopathy or easy bruising Neurological:Denies numbness, tingling or new weaknesses (+) s/p CVA with right side weakness  Behavioral/Psych: Mood is stable, no new  changes (+) h/o depression All other systems were reviewed with the patient and are negative.  PHYSICAL EXAMINATION: ECOG PERFORMANCE STATUS: 1 - Symptomatic but completely ambulatory  Vitals:   12/24/20 1100  BP: (!) 154/81  Pulse: (!) 57  Resp: 16  Temp: 98.6 F (37 C)  SpO2: 97%   Filed Weights   12/24/20 1100  Weight: 161 lb 3.2 oz (73.1 kg)    GENERAL:alert, no distress and comfortable SKIN: No rash EYES:  sclera clear NECK: Palpable 2 cm right cervical/submandibular lymph node LUNGS: clear with normal breathing effort HEART: regular rate & rhythm, no lower extremity edema ABDOMEN:abdomen soft, non-tender and normal bowel sounds Musculoskeletal:no cyanosis of digits and no clubbing  PSYCH: alert & oriented x 3 with fluent speech NEURO: Right side weakness  LABORATORY DATA:  I have reviewed the data as listed CBC Latest Ref Rng & Units 12/24/2020 10/14/2020 04/08/2020  WBC 4.0 - 10.5 K/uL 6.9 6.7 7.3  Hemoglobin 13.0 - 17.0 g/dL 15.6 14.6 14.6  Hematocrit 39.0 - 52.0 % 46.6 44.5 41.9  Platelets 150 - 400 K/uL 163 162.0 179.0   CMP Latest Ref Rng & Units 12/24/2020 11/30/2020 10/14/2020  Glucose 70 - 99 mg/dL 85 71 94  BUN 8 - 23 mg/dL 18 18 25(H)  Creatinine 0.61 - 1.24 mg/dL 1.07 0.99 0.97  Sodium 135 - 145 mmol/L 140 142 142  Potassium 3.5 - 5.1 mmol/L 3.7 3.6 3.8  Chloride 98 - 111 mmol/L 104 103 106  CO2 22 - 32 mmol/L 26 32 29  Calcium 8.9 - 10.3 mg/dL 9.6 9.5 9.6  Total Protein 6.5 - 8.1 g/dL 7.5 - -  Total Bilirubin 0.3 - 1.2 mg/dL 1.2 - -  Alkaline Phos 38 - 126 U/L 107 - -  AST 15 - 41 U/L 23 - -  ALT 0 - 44 U/L 25 - -    RADIOGRAPHIC STUDIES: I have personally reviewed the radiological images as listed and agreed with the findings in the report. CT CHEST W CONTRAST  Result Date: 12/09/2020 CLINICAL DATA:  Loss of weight. Enlarged cervical lymph nodes. Difficulty swallowing. EXAM: CT CHEST AND ABDOMEN WITH CONTRAST TECHNIQUE: Multidetector CT imaging  of the chest, abdomen and pelvis was performed following the standard protocol during bolus administration of intravenous contrast. CONTRAST:  58m OMNIPAQUE IOHEXOL 350 MG/ML SOLN COMPARISON:  CT chest 06/09/14 FINDINGS: CT CHEST FINDINGS Cardiovascular: Normal heart size. No pericardial effusion. Aortic atherosclerosis and coronary artery calcifications. Mediastinum/Nodes: No enlarged mediastinal, hilar, or axillary lymph nodes. Thyroid gland, trachea, and esophagus demonstrate no significant findings. Lungs/Pleura: Mild paraseptal emphysema. No pleural effusion, airspace consolidation, or atelectasis. No suspicious lung nodules. Musculoskeletal: No chest wall mass or suspicious bone lesions identified. CT ABDOMEN FINDINGS Hepatobiliary: No focal liver abnormality is seen. Gallstone noted measuring 7 mm. No signs of gallbladder wall inflammation or bile duct dilatation. Pancreas: Unremarkable. No pancreatic ductal dilatation or surrounding inflammatory changes. Spleen: Normal in size without focal abnormality. Adrenals/Urinary Tract: Normal adrenal glands. Right kidney cyst measures 4.2 by 4.2 cm. No suspicious mass or hydronephrosis identified bilaterally. Stomach/Bowel: A mass is suspected with epicenter at the GE junction measuring this is best seen on the sagittal images measuring 3.8 x 4.2 cm, image 105/7. The visualized bowel loops are unremarkable without signs of wall thickening, inflammation or distension. Vascular/Lymphatic: Aortic atherosclerosis. Prominent perigastric lymph nodes are identified in the gastro Paddock ligament region. Index node measures 1 cm, image 52/3. No porta hepatic, retroperitoneal or mesenteric nodes. Other: No abdominal wall hernia or abnormality. No abdominopelvic ascites. Musculoskeletal: No acute or significant osseous findings. IMPRESSION: 1. Mass is suspected just at the level of the GE junction. Suspicious for primary esophageal neoplasm. Further evaluation with endoscopy is  advised. 2. Prominent perigastric lymph nodes are identified, suspicious for metastatic adenopathy. 3. Gallstone. 4. Coronary artery calcifications. 5. Aortic Atherosclerosis (ICD10-I70.0) and Emphysema (ICD10-J43.9). 6. These results will be called to the ordering clinician or representative by the Radiologist Assistant, and communication documented in the PACS or [employer] Electronically Signed   By: TKerby MoorsM.D.   On: 12/09/2020 18:13   CT ABDOMEN W CONTRAST  Result Date: 12/09/2020 CLINICAL DATA:  Loss of weight. Enlarged cervical lymph nodes. Difficulty swallowing. EXAM: CT CHEST AND ABDOMEN WITH CONTRAST TECHNIQUE: Multidetector CT imaging of the chest, abdomen and pelvis was performed following the standard protocol during bolus administration of intravenous contrast. CONTRAST:  841mOMNIPAQUE IOHEXOL 350 MG/ML SOLN COMPARISON:  CT chest 06/09/14 FINDINGS: CT CHEST FINDINGS Cardiovascular: Normal heart size. No pericardial effusion. Aortic atherosclerosis and coronary artery calcifications. Mediastinum/Nodes: No enlarged mediastinal, hilar, or axillary lymph nodes. Thyroid gland, trachea, and esophagus demonstrate no significant findings. Lungs/Pleura: Mild paraseptal emphysema. No pleural effusion, airspace consolidation, or atelectasis. No suspicious lung nodules. Musculoskeletal: No chest wall mass or suspicious bone lesions identified. CT ABDOMEN FINDINGS Hepatobiliary: No focal liver abnormality is seen. Gallstone noted measuring 7 mm. No signs of gallbladder wall inflammation or bile duct dilatation. Pancreas: Unremarkable. No pancreatic ductal dilatation or surrounding inflammatory changes. Spleen: Normal in size without focal abnormality. Adrenals/Urinary Tract: Normal adrenal glands. Right kidney cyst measures 4.2 by 4.2 cm. No suspicious mass or hydronephrosis identified bilaterally. Stomach/Bowel: A mass is suspected with epicenter at the GE junction measuring this is best seen on  the sagittal images measuring 3.8 x  4.2 cm, image 105/7. The visualized bowel loops are unremarkable without signs of wall thickening, inflammation or distension. Vascular/Lymphatic: Aortic atherosclerosis. Prominent perigastric lymph nodes are identified in the gastro Paddock ligament region. Index node measures 1 cm, image 52/3. No porta hepatic, retroperitoneal or mesenteric nodes. Other: No abdominal wall hernia or abnormality. No abdominopelvic ascites. Musculoskeletal: No acute or significant osseous findings. IMPRESSION: 1. Mass is suspected just at the level of the GE junction. Suspicious for primary esophageal neoplasm. Further evaluation with endoscopy is advised. 2. Prominent perigastric lymph nodes are identified, suspicious for metastatic adenopathy. 3. Gallstone. 4. Coronary artery calcifications. 5. Aortic Atherosclerosis (ICD10-I70.0) and Emphysema (ICD10-J43.9). 6. These results will be called to the ordering clinician or representative by the Radiologist Assistant, and communication documented in the PACS or [employer]. Electronically Signed   By: Kerby Moors M.D.   On: 12/09/2020 18:13   DG ESOPHAGUS W SINGLE CM (SOL OR THIN BA)  Result Date: 11/30/2020 CLINICAL DATA:  Dysphagia, weight loss EXAM: ESOPHOGRAM/BARIUM SWALLOW TECHNIQUE: Single contrast examination was performed using thin barium. The patient was observed with fluoroscopy swallowing a 13 mm barium sulphate tablet. FLUOROSCOPY TIME:  Fluoroscopy Time:  59 seconds Radiation Exposure Index (if provided by the fluoroscopic device): 8.5 mGy COMPARISON:  None. FINDINGS: Limitations on positioning due to sequelae of stroke. The patient swallowed barium without difficulty. No aspiration. There is no mass or stricture. Normal motility. There is no hiatal hernia. No spontaneous gastroesophageal reflux. Barium pill was swallowed and passed without difficulty. IMPRESSION: No significant abnormality. Electronically Signed   By:  Macy Mis M.D.   On: 11/30/2020 12:03    ASSESSMENT & PLAN: 80 year old male  Adenocarcinoma of the GE junction -We reviewed his medical history and work-up with the patient and his daughter.  He presented with 6-8 months of dysphagia and 10 pounds weight loss, which progressed more recently and he palpated a right cervical lymph node.   -Work-up showed a partially obstructing mass at the GE junction extending into the gastric cardia, path confirmed adenocarcinoma.  CT is concerning for perigastric and possibly cervical lymphadenopathy.  We are referring him for PET scan to complete staging.  The family will arrange biopsy of the right cervical node per ENT Dr. Fredric Dine. -We reviewed the aggressive nature of esophageal adenocarcinoma and the various treatment scenarios. -If PET scan and cervical node are negative for distant metastasis, we discussed a course of concurrent chemoradiation with carboplatin and Taxol.  -If PET scan and biopsy are negative, we will refer him to cardiothoracic surgeon Dr. Kipp Brood.  We briefly discussed the likely extensive nature of the surgery and potential for prolonged recovery. With his advanced age, co-morbidities, and physical limitations from CVA, surgery would be very challenging and he may not be a candidate -If he is felt to be a surgical candidate, he may require further evaluation with EUS for local staging -If PET scan and/or cervical node biopsy are positive for distant metastasis, we discussed he would not be a surgical candidate and therefore his cancer would not be curable but still treatable.  -We discussed still proceeding with radiation versus chemoradiation for palliative intent -We will request molecular testing for MMR/MSI, PD-L1 and HER2 to see if he is a candidate for immunotherapy and/or targeted therapy -We discussed comfort care alone, I introduced palliative care and/or hospice. -He is interested in completing the staging work-up and  having a further discussion about his treatment options.   -Referrals placed to radiation and dietitian,  will hold off on referral to surgery for now -Lab today -We will see him back after PET scan and biopsy to review results and discuss treatment. -Patient seen with Dr. Burr Medico  2. Dysphagia and weight loss -Secondary to #1 -Onset 6-8 months ago with 10-15 pounds weight loss  -solid dysphagia is moderate, he can still tolerate soft food and some solids.  Does pretty well with liquids and medications -Recommend to start nutrition supplement -No urgent need for parenteral nutrition at this time -I referred him to dietitian  3. Goals of care -He understands if work-up confirms metastatic disease, it will not be curable but still treatable, treatment will be palliative  4. Comorbidities: CVA with residual right-sided weakness, history of MI, CAD, HTN, DM, HL -Patient lives alone, his daughter checks on him frequently to help with housework and food prep -He is limited in his ADLs due to residual right-sided weakness from CVA -He ambulates with support, can bathe and dress with difficulty   PLAN: -Work up reviewed -Proceed with PET scan and R cervical node bx -Request molecular studies on esophageal sample -Referrals to nutrition and radiation -Lab today -F/up after complete work up    Orders Placed This Encounter  Procedures   NM PET Image Initial (PI) Skull Base To Thigh    Standing Status:   Future    Standing Expiration Date:   12/24/2021    Order Specific Question:   If indicated for the ordered procedure, I authorize the administration of a radiopharmaceutical per Radiology protocol    Answer:   Yes    Order Specific Question:   Preferred imaging location?    Answer:   [facility]   CBC with Differential (Cancer Center Only)    Standing Status:   Standing    Number of Occurrences:   50    Standing Expiration Date:   12/24/2021   CMP ([facility] only)    Standing  Status:   Standing    Number of Occurrences:   50    Standing Expiration Date:   12/24/2021   CEA (IN HOUSE-[facility])FOR [facility] ONLY    Standing Status:   Standing    Number of Occurrences:   50    Standing Expiration Date:   12/24/2021   Ambulatory Referral to [facility]    Referral Priority:   Routine    Referral Type:   Consultation    Referral Reason:   Specialty Services Required    Number of Visits Requested:   1   Ambulatory referral to Radiation Oncology    Referral Priority:   Routine    Referral Type:   Consultation    Referral Reason:   Specialty Services Required    Requested Specialty:   Radiation Oncology    Number of Visits Requested:   1     All questions were answered. The patient knows to call the clinic with any problems, questions or concerns.     Truitt Merle, MD 12/24/2020   Addendum  I have seen the patient, examined him. I agree with the assessment and and plan and have edited the notes.   80 year old gentleman with past medical history of HTN, DM, HL, CAD, CVA, and diastolic heart failure who presented with a palpable right up cervical mass, dysphagia and weight loss.  His EGD showed a large tumor in the distal esophagus, GE junction and gastric cardia.  Apsey confirmed adenocarcinoma.  Staging CT scan showed regional adenopathy.  I recommend PET  scan for staging, and a biopsy of the right cervical lymph node by his ENT to rule out metastasis or secondary primary.  Due to his significant dysphagia, he would benefit from radiation even if he has metastatic disease.  We will refer him to radiation oncologist Dr. Lisbeth Renshaw.  I will request molecular testing, including MMR, HER2, and PD-L1 on his biopsy sample, to see if he is a candidate for immunotherapy and targeted therapy.  Due to his advanced age and medical comorbidities, limited performance status, he would not be a good candidate for intensive chemotherapy, but low intensity chemotherapy could be considered.   Plan to see him back after the PET scan and the biopsy to finalize his treatment plan.  All questions were answered.  Truitt Merle  12/24/2020

## 2020-12-25 LAB — CEA (IN HOUSE-CHCC): CEA (CHCC-In House): 3.28 ng/mL (ref 0.00–5.00)

## 2020-12-25 NOTE — Progress Notes (Signed)
I spoke with [name], Mr [place] daughter.  I reviewed PET scan appt location, ate, arrival time, and instructions.  Mr Koren Bound is to hold his am insulin and jardiance.  Nothing to eat or drink except water after 0230.  She repeated this back to me.  All questions were answered.  She verbalized understanding.

## 2020-12-25 NOTE — Progress Notes (Signed)
I spoke with Jeffrey [name] regarding upcoming appt with dr Burr Medico on 11/1 at 0920.  She verbalized understanding.

## 2020-12-28 ENCOUNTER — Telehealth: Payer: Self-pay

## 2020-12-28 NOTE — Progress Notes (Addendum)
Chronic Care Management Pharmacy Assistant   Name: Jeffrey [name]  MRN: [id number] DOB: 1940-04-04  Reason for Encounter: CCM (Hypertension Disease State)   Recent office visits:  12/10/2020 -  [name], MD - Telephone - Ok to hold plavix 5 days due to endoscopic procedure on 12/17/20, restart once hemostasis achieved. Start: Aspirin EC 81mg  while off Plavix.  10/28/2020 - [name], MD - Telephone - Referral to ENT 08/*19/2022 - [name], MD - Telephone - Start: mupirocin cream [facility]) 2 % due to possible bacterial infection on penis.  10/14/2020 - [name], MD - Patient presented for head and neck lymphadenopathy. Labs: BMP, A1c, CBC, CT soft tissue neck w/contrast. Referral to Gastroenterology. Start: pantoprazole (PROTONIX) 20 MG tablet and for skin rash - anticipate yeast infection - treat with clotrimazole cream over the counter (lotrmin) twice daily for 2 weeks. Stop due to courses completed; doxycycline (VIBRAMYCIN) 100 MG capsule and triamcinolone (KENALOG) 0.1 %  Recent consult visits: .  12/25/2020 - Oncology navigator - Hold: AM insulin and Jardiance for PET scan.  12/24/2020 - Oncology - Patient presented for malignant neoplasm of lower third of esophagus. Labs: CBC, CEA for [facility], CMP, NM PET image initial skull base to thigh. Referral to [facility].  12/17/2020 Jeffrey [name] - Patient presented for Upper Endoscopy. Referral to Hematology/Oncology.  12/16/2020 - Otolaryngology - Patient presented for cervical adenopathy. Procedure: [place] Head Neck Soft Tissue Thyroid  .  12/15/2020 - Otolaryngology - Patient presented for for evaluation and treatment of right sided cervical adenopathy. Multidetector CT imaging of the neck was performed and Flexible Laryngoscopy . Referral to ENT.  11/27/2020 - Gastroenterology - Patient presented for Esophageal Dysphagia. Labs: BMP, CT Abdomen w/contrast, CT chest w/contrast and DG esophageus  w single cm. Start: baclofen (LIORESAL) 10 MG tablet Take one tablet by mouth 3 times a day as needed for spasticity.  [facility] - [facility] called requesting refill of METOPROLOL TarTRATE 25MG  TAB. 11/03/2020 - Podiatry - Patient presented with pain due to onychomycosis of both feet. Toenails 1-5 b/l were debrided in length and girth with sterile nail nippers and dremel without iatrogenic bleeding. [name]   Hospital visits:  None in previous 6 months  Medications: Outpatient Encounter Medications as of 12/28/2020  Medication Sig   ACIDOPHILUS LACTOBACILLUS PO Take 2 tablets by mouth daily.   atorvastatin (LIPITOR) 80 MG tablet Take 80 mg by mouth at bedtime.   baclofen (LIORESAL) 10 MG tablet TAKE ONE TABLET BY MOUTH 3 TIMES A DAY AS NEEDED FOR SPASTICITY.   Cholecalciferol 25 MCG (1000 UT) tablet Take by mouth.   clopidogrel (PLAVIX) 75 MG tablet Take 1 tablet (75 mg total) by mouth daily.   empagliflozin (JARDIANCE) 25 MG TABS tablet Take 1 tablet by mouth daily.   fosinopril ([name]) 40 MG tablet Take 1 tablet (40 mg total) by mouth daily.   furosemide (LASIX) 20 MG tablet Take 1 tablet (20 mg total) by mouth daily AND 2 tablets (40 mg total) 3 (three) times a week. Every day 20mg , some days 40mg .   insulin aspart (NOVOLOG FLEXPEN) 100 UNIT/ML FlexPen Inject 10 Units into the skin in the morning and at bedtime. With meals   insulin glargine (LANTUS) 100 UNIT/ML Solostar Pen Inject 24 Units into the skin at bedtime.   LACTOBACILLUS PO Take 1 capsule by mouth daily. [facility] colon health.  Takes M/W/F   loratadine (CLARITIN) 10 MG tablet 1 tablet  metoprolol tartrate (LOPRESSOR) 25 MG tablet Take 1 tablet (25 mg total) by mouth 2 (two) times daily.   mupirocin cream (BACTROBAN) 2 % Apply 1 application topically 2 (two) times daily. (Patient not taking: Reported on 12/17/2020)   mupirocin ointment (BACTROBAN) 2 % APPLY TO AFFECTED AREA TWICE A DAY (Patient not taking: Reported  on 12/17/2020)   pantoprazole (PROTONIX) 20 MG tablet TAKE 1 TABLET BY MOUTH EVERY DAY   Potassium 99 MG TABS Take 1 tablet (99 mg total) by mouth daily as needed (when you take 2 lasix pills).   sertraline (ZOLOFT) 25 MG tablet Take 1 tablet (25 mg total) by mouth daily.   No facility-administered encounter medications on file as of 12/28/2020.   Recent Office Vitals: BP Readings from Last 3 Encounters:  12/24/20 (!) 154/81  12/17/20 (!) 161/86  11/27/20 (!) 160/72   Pulse Readings from Last 3 Encounters:  12/24/20 (!) 57  12/17/20 70  11/27/20 (!) 55    Wt Readings from Last 3 Encounters:  12/24/20 161 lb 3.2 oz (73.1 kg)  12/17/20 163 lb (73.9 kg)  11/27/20 163 lb 4 oz (74 kg)    Kidney Function Lab Results  Component Value Date/Time   CREATININE 1.07 12/24/2020 12:32 PM   CREATININE 0.99 11/30/2020 11:52 AM   CREATININE 0.97 10/14/2020 12:20 PM   CREATININE 1.24 09/30/2013 12:00 AM   CREATININE 1.24 09/30/2013 12:00 AM   GFR 73.29 11/30/2020 11:52 AM   GFRNONAA >60 12/24/2020 12:32 PM   GFRAA >60 09/13/2014 01:08 PM   BMP Latest Ref Rng & Units 12/24/2020 11/30/2020 10/14/2020  Glucose 70 - 99 mg/dL 85 71 94  BUN 8 - 23 mg/dL 18 18 25(H)  Creatinine 0.61 - 1.24 mg/dL 1.07 0.99 0.97  Sodium 135 - 145 mmol/L 140 142 142  Potassium 3.5 - 5.1 mmol/L 3.7 3.6 3.8  Chloride 98 - 111 mmol/L 104 103 106  CO2 22 - 32 mmol/L 26 32 29  Calcium 8.9 - 10.3 mg/dL 9.6 9.5 9.6   Contacted patient on 12/25/2020 to discuss hypertension disease state  Current antihypertensive regimen:  Fosinopril 40 mg - 1 tablet daily Metoprolol tartrate 25 mg- 1 tablet BID    Patient verbally confirms he is taking the above medications as directed. Yes  How often are you checking your Blood Pressure? Patient's daughter states they are not checking patient's blood pressure at home. They are not concerned with patient's blood pressure as patient was diagnosed with cancer in his esophagus. They are  meeting with Oncology on 10/26 for a PET scan and they meet with the surgeon on 10/27. The family is just trying to make it through to see if it has spread and what the next steps are.   Caffeine intake: Drinks about 10-12 cups of coffee Salt intake: Does not add salt to food OTC medications including pseudoephedrine or NSAIDs? Takes Loratadine 10mg  - Takes 1 a day March - June and August - October.   What recent interventions/DTPs have been made by any provider to improve Blood Pressure control since last CPP Visit: Patient's daughter states they are not checking patient's blood pressure at home. They are not concerned with patient's blood pressure as patient was diagnosed with cancer in his esophagus.   Any recent hospitalizations or ED visits since last visit with CPP? No  What diet changes have been made to improve Blood Pressure Control?  Patient eats a soft diet due to cancer of esophagus.   What exercise  is being done to improve your Blood Pressure Control?  Patient is trying to do sit ups and get out and walk.   Adherence Review: Is the patient currently on ACE/ARB medication? Yes Does the patient have >5 day gap between last estimated fill dates? Unsure as patient's daughter was unable to verify fill dates.   Star Rating Drugs:  Medication:  Last Fill: Day Supply Went over the following medications with patient's daughter. She verified patient was taking them, but could not verify fill dates.  Fosinopril 40mg  From [place] - Unsure of fill date -  Jardiance 25mg   From [place] - unsure of fill date Lantus 100 unit From [place] - Unsure of fill date Novolog Flexpen 100  From [place] of fill date Trulicity   Never started/never filled   Care Gaps: Annual wellness visit in last year? Yes Most Recent BP reading: 154/81 on 12/24/2020  If Diabetic: Most recent A1C reading: 7.4 on 10/14/2020 Last eye exam / retinopathy screening: 11/30/2018 Last diabetic foot exam: Up to date  PET Scan  appointment on 12/30/2020 Radiation Oncology appointment on 12/31/2020 Oncology appointment on 01/01/2021 EKG appointment on 01/20/2021  Unable to make follow up appointment with Debbora Dus as daughter states they are taking it day by day with the recent cancer diagnosis.   Debbora Dus, CPP notified  Marijean Niemann, [place] Clinical Pharmacy Assistant [phone]  Time Spent: 63 Minutes  I have reviewed the care management and care coordination activities outlined in this encounter and I am certifying that I agree with the content of this note. No further action required.  Debbora Dus, PharmD Clinical Pharmacist [facility] at [facility] [phone]

## 2020-12-28 NOTE — Telephone Encounter (Signed)
Forwarded to Cira Rue, NP

## 2020-12-29 NOTE — Telephone Encounter (Signed)
Thank you Jeffrey [name],  I called his daughter [name] (pt's POA) and spoke to her about the plan. She is concerned about putting her father through a LN biopsy if not necessary. I discussed limitations of imaging/PET scan and usually we need biopsy to confirm metastasis especially when treatment and surgical decisions depend on this information. She says he may not want surgery even if offered, so do we even need biopsy. PET scan 10/26, RT consult with Dr. Lisbeth Renshaw 10/27, and f/up with Dr. Burr Medico 11/1. LN biopsy is not until 11/4 with ENT. Depending on PET results may ask IR to do it sooner. She voiced that her father is a man of strong faith and values quality of life, hopes to have results soon to make informed decisions; I agree and supported her. Agrees to see what PET shows and hear team's recommendations. No further needs at this time.   Cira Rue, NP 12/29/20

## 2020-12-30 ENCOUNTER — Ambulatory Visit (HOSPITAL_COMMUNITY)
Admission: RE | Admit: 2020-12-30 | Discharge: 2020-12-30 | Disposition: A | Discharge status: Home / Self Care | Payer: PPO | Source: Ambulatory Visit | Attending: Nurse Practitioner | Admitting: Nurse Practitioner

## 2020-12-30 DIAGNOSIS — K8080 Other cholelithiasis without obstruction: Secondary | ICD-10-CM | POA: Diagnosis not present

## 2020-12-30 DIAGNOSIS — I2510 Atherosclerotic heart disease of native coronary artery without angina pectoris: Secondary | ICD-10-CM | POA: Insufficient documentation

## 2020-12-30 DIAGNOSIS — J439 Emphysema, unspecified: Secondary | ICD-10-CM | POA: Diagnosis not present

## 2020-12-30 DIAGNOSIS — I700 Atherosclerosis of aorta: Secondary | ICD-10-CM | POA: Insufficient documentation

## 2020-12-30 DIAGNOSIS — C169 Malignant neoplasm of stomach, unspecified: Secondary | ICD-10-CM | POA: Diagnosis not present

## 2020-12-30 DIAGNOSIS — N400 Enlarged prostate without lower urinary tract symptoms: Secondary | ICD-10-CM | POA: Insufficient documentation

## 2020-12-30 DIAGNOSIS — C155 Malignant neoplasm of lower third of esophagus: Secondary | ICD-10-CM | POA: Insufficient documentation

## 2020-12-30 LAB — GLUCOSE, CAPILLARY: Glucose-Capillary: 104 mg/dL — ABNORMAL HIGH (ref 70–99)

## 2020-12-30 MED ORDER — FLUDEOXYGLUCOSE F - 18 (FDG) INJECTION
8.1000 | Freq: Once | INTRAVENOUS | Status: AC | PRN
  Administered 2020-12-30: 8.1 via INTRAVENOUS

## 2020-12-30 NOTE — Progress Notes (Signed)
GI Location of Tumor / Histology: Adenocarcinoma of the GE Junction  Jeffrey [name] presented 6-8 months history of dysphagia and 10 pound weight loss, which progressed more recently and he palpated a right cervical lymph node.  PET 12/30/2020:  CT CAP 12/08/2020: Mass is suspected just at the level of the GE junction measuring 3.8 x 4.2 cm.  Suspicious for primary esophageal neoplasm.  Prominent perigastric lymph nodes are identified, suspicious for metastatic adenopathy.  No enlarged mediastinal, hilar, or axillary lymph nodes. Thyroid gland, trachea, and esophagus demonstrate no significant findings.  Biopsies of   Past/Anticipated interventions by surgeon, if any:   Past/Anticipated interventions by medical oncology, if any:  Dr. Burr Medico Cira Rue 12/24/2020 -Work-up showed a partially obstructing mass at the GE junction extending into the gastric cardia, path confirmed adenocarcinoma.  CT is concerning for perigastric and possibly cervical lymphadenopathy.  We are referring him for PET scan to complete staging.  The family will arrange biopsy of the right cervical node per ENT Dr. Fredric Dine. -If PET scan and cervical node are negative for distant metastasis, we discussed a course of concurrent chemoradiation with carboplatin and Taxol.  -If PET scan and biopsy are negative, we will refer him to cardiothoracic surgeon Dr. Kipp Brood.  We briefly discussed the likely extensive nature of the surgery and potential for prolonged recovery. With his advanced age, co-morbidities, and physical limitations from CVA, surgery would be very challenging and he may not be a candidate -If he is felt to be a surgical candidate, he may require further evaluation with EUS for local staging -If PET scan and/or cervical node biopsy are positive for distant metastasis, we discussed he would not be a surgical candidate and therefore his cancer would not be curable but still treatable.  -We discussed still  proceeding with radiation versus chemoradiation for palliative intent -We discussed comfort care alone, I introduced palliative care and/or hospice. -He is interested in completing the staging work-up and having a further discussion about his treatment options.   -Referrals placed to radiation and dietitian, will hold off on referral to surgery for now    Weight changes, if any: Stable, lost about 10-15 pounds since this started.  Bowel/Bladder complaints, if any: He reports some looseness to his bowels.  Nausea / Vomiting, if any: Notes some regurgitation feelings if he eats to much.  Pain issues, if any:  Notes that it takes his food/liquid a little longer to go down, has some discomfort.  Diet: Eating soft foods.  Meats if he chops it in smaller portions.  A lot of trial and error with foods.  Supplementing with high protein shakes.    SAFETY ISSUES: Prior radiation? No Pacemaker/ICD? No Possible current pregnancy? No Is the patient on methotrexate? No  Current Complaints/Details:

## 2020-12-30 NOTE — Progress Notes (Signed)
I left vm for Jeffrey [name] that Dr Burr Medico is available to discuss MR Jeffrey [name] PET scan results via phone visit on 10/27 at 1630 if they would like.  I requested she return my call.

## 2020-12-31 ENCOUNTER — Encounter: Payer: Self-pay | Admitting: Hematology

## 2020-12-31 ENCOUNTER — Inpatient Hospital Stay (HOSPITAL_BASED_OUTPATIENT_CLINIC_OR_DEPARTMENT_OTHER): Payer: PPO | Admitting: Hematology

## 2020-12-31 ENCOUNTER — Ambulatory Visit
Admission: RE | Admit: 2020-12-31 | Discharge: 2020-12-31 | Disposition: A | Discharge status: Home / Self Care | Payer: PPO | Source: Ambulatory Visit | Attending: Radiation Oncology | Admitting: Radiation Oncology

## 2020-12-31 ENCOUNTER — Encounter: Payer: Self-pay | Admitting: Radiation Oncology

## 2020-12-31 ENCOUNTER — Other Ambulatory Visit: Payer: Self-pay

## 2020-12-31 VITALS — BP 160/86 | HR 64 | Temp 97.7°F | Resp 20 | Ht 68.0 in | Wt 161.6 lb

## 2020-12-31 DIAGNOSIS — C158 Malignant neoplasm of overlapping sites of esophagus: Secondary | ICD-10-CM | POA: Diagnosis not present

## 2020-12-31 DIAGNOSIS — I10 Essential (primary) hypertension: Secondary | ICD-10-CM

## 2020-12-31 DIAGNOSIS — Z8719 Personal history of other diseases of the digestive system: Secondary | ICD-10-CM | POA: Insufficient documentation

## 2020-12-31 DIAGNOSIS — Z79899 Other long term (current) drug therapy: Secondary | ICD-10-CM | POA: Insufficient documentation

## 2020-12-31 DIAGNOSIS — M109 Gout, unspecified: Secondary | ICD-10-CM | POA: Diagnosis not present

## 2020-12-31 DIAGNOSIS — I69351 Hemiplegia and hemiparesis following cerebral infarction affecting right dominant side: Secondary | ICD-10-CM | POA: Diagnosis not present

## 2020-12-31 DIAGNOSIS — Z803 Family history of malignant neoplasm of breast: Secondary | ICD-10-CM | POA: Diagnosis not present

## 2020-12-31 DIAGNOSIS — I700 Atherosclerosis of aorta: Secondary | ICD-10-CM | POA: Insufficient documentation

## 2020-12-31 DIAGNOSIS — K219 Gastro-esophageal reflux disease without esophagitis: Secondary | ICD-10-CM | POA: Diagnosis not present

## 2020-12-31 DIAGNOSIS — E785 Hyperlipidemia, unspecified: Secondary | ICD-10-CM

## 2020-12-31 DIAGNOSIS — I639 Cerebral infarction, unspecified: Secondary | ICD-10-CM

## 2020-12-31 DIAGNOSIS — I2510 Atherosclerotic heart disease of native coronary artery without angina pectoris: Secondary | ICD-10-CM

## 2020-12-31 DIAGNOSIS — J439 Emphysema, unspecified: Secondary | ICD-10-CM | POA: Insufficient documentation

## 2020-12-31 DIAGNOSIS — E1140 Type 2 diabetes mellitus with diabetic neuropathy, unspecified: Secondary | ICD-10-CM | POA: Diagnosis not present

## 2020-12-31 DIAGNOSIS — C155 Malignant neoplasm of lower third of esophagus: Secondary | ICD-10-CM

## 2020-12-31 DIAGNOSIS — E119 Type 2 diabetes mellitus without complications: Secondary | ICD-10-CM | POA: Diagnosis not present

## 2020-12-31 DIAGNOSIS — I252 Old myocardial infarction: Secondary | ICD-10-CM | POA: Insufficient documentation

## 2020-12-31 DIAGNOSIS — Z794 Long term (current) use of insulin: Secondary | ICD-10-CM | POA: Insufficient documentation

## 2020-12-31 DIAGNOSIS — R634 Abnormal weight loss: Secondary | ICD-10-CM | POA: Insufficient documentation

## 2020-12-31 DIAGNOSIS — C160 Malignant neoplasm of cardia: Secondary | ICD-10-CM | POA: Diagnosis not present

## 2020-12-31 DIAGNOSIS — Z87891 Personal history of nicotine dependence: Secondary | ICD-10-CM | POA: Insufficient documentation

## 2020-12-31 MED ORDER — PROCHLORPERAZINE MALEATE 10 MG PO TABS: 10.0000 mg | ORAL_TABLET | Freq: Four times a day (QID) | ORAL | 1 refills | Status: DC | PRN

## 2020-12-31 MED ORDER — ONDANSETRON HCL 8 MG PO TABS: 8.0000 mg | ORAL_TABLET | Freq: Two times a day (BID) | ORAL | 1 refills | Status: DC | PRN

## 2020-12-31 NOTE — Progress Notes (Signed)
START ON PATHWAY REGIMEN - Gastroesophageal     Administer weekly during RT:     Paclitaxel      Carboplatin   **Always confirm dose/schedule in your pharmacy ordering system**  Patient Characteristics: Esophageal & GE Junction, Adenocarcinoma, Preoperative or Nonsurgical Candidate (Clinical Staging), cT2 or Higher or cN+, Unresectable/Nonsurgical Candidate (Any cT) Histology: Adenocarcinoma Disease Classification: GE Junction Therapeutic Status: Preoperative or Nonsurgical Candidate (Clinical Staging) AJCC Grade: G2 AJCC 8 Stage Grouping: Unknown AJCC T Category: cTX AJCC N Category: cN1 AJCC M Category: cM0 Intent of Therapy: Curative Intent, Discussed with Patient

## 2020-12-31 NOTE — Progress Notes (Signed)
[place]   [phone] Fax:(336) [phone]   Clinic Follow up Note   Patient Care Team: [name], MD as PCP - General (Family Medicine) Jerline Pain, MD as PCP - Cardiology (Cardiology) Delia Chimes, NP (Inactive) as Nurse Practitioner (Nurse Practitioner) Ladene Artist, MD as Consulting Physician (Gastroenterology) Alla Feeling, NP as Nurse Practitioner (Nurse Practitioner) Truitt Merle, MD as Consulting Physician (Hematology) Truitt Merle, MD as Consulting Physician (Oncology)  Date of Service:  12/31/2020  I connected with Jeffrey [name] on 12/31/2020 at  4:20 PM EDT by telephone visit and verified that I am speaking with the correct person using two identifiers.  I discussed the limitations, risks, security and privacy concerns of performing an evaluation and management service by telephone and the availability of in person appointments. I also discussed with the patient that there may be a patient responsible charge related to this service. The patient expressed understanding and agreed to proceed.   Other persons participating in the visit and their role in the encounter:  patient's daughter Jeffrey [name]  Patient's location:  home Provider's location:  my office  CHIEF COMPLAINT: f/u of esophageal cancer  CURRENT THERAPY:  Pending concurrent chemoRT  ASSESSMENT & PLAN:  Jeffrey [name] is a 80 y.o. male with   Adenocarcinoma of the GE junction, cTxN1M0, HER2(-), MMR normal, PD-L1 CPS 3% -He presented with 6-8 months of dysphagia and 10 pounds weight loss, which progressed. He also palpated a right cervical lymph node. Initial CT neck on 10/22/20 showed 2.8 cm lymph node, concerning for metastatic disease. -CT CA 12/08/20 showed suspicious mass at GE junction and prominent perigastric lymph nodes. EGD on 12/17/20 with Dr. Fuller Plan showing 5 cm mass in distal esophagus, with biopsy confirming adenocarcinoma with signet ring features. -PET scan  on 12/30/20 showed FDG-avid mass at GE junction and mild uptake with borderline gastrohepatic ligament lymph nodes. No distant metastatic disease. -given these findings, we recommend proceeding with concurrent chemoradiation. They met with Dr. Lisbeth Renshaw earlier today (12/31/20) and discussed radiation therapy. We will plan to begin around 01/18/21. -I recommend weekly carbo and taxol with concurrent RT --Chemotherapy consent: Side effects including but does not not limited to, fatigue, nausea, vomiting, diarrhea, hair loss, neuropathy, fluid retention, renal and kidney dysfunction, neutropenic fever, needed for blood transfusion, bleeding, were discussed with patient in great detail. He agrees to proceed. -goal of therapy is curative although possibility of cure is low  -Pt is not a surgical candidate. I am not sure if he can tolerate consolidation FOLFOX. I reviewed his molecular testing results, and recommend Nivo after chemoRT    2. Dysphagia and weight loss -Secondary to #1 -Onset 6-8 months ago with 10-15 pounds weight loss  -solid dysphagia is moderate, he can still tolerate soft food and some solids.  Does pretty well with liquids and medications -Recommend to start nutrition supplement -No urgent need for parenteral nutrition at this time -he is scheduled to meet with our dietician tomorrow, 10/28.   3. Goals of care -He understands if work-up confirms metastatic disease, it will not be curable but still treatable, treatment will be palliative   4. Comorbidities: CVA with residual right-sided weakness, history of MI, CAD, HTN, DM, HL -Patient lives alone, his daughter checks on him frequently to help with housework and food prep -He is limited in his ADLs due to residual right-sided weakness from CVA -He ambulates with support, can bathe and dress with difficulty  5. Right neck  lesion -not hypermetabolic on PET scan, I reviewed with radiologist today, likely benign, although malignancy is  not ruled out  -will recommend IR aspiration/biopsy      PLAN: -nutrition consult tomorrow, 10/28 -chemo class in next 2 weeks -plan to begin chemoRT 01/18/21 -IR right neck mass biopsy    No problem-specific Assessment & Plan notes found for this encounter.    SUMMARY OF ONCOLOGIC HISTORY: Oncology History Overview Note  Cancer Staging Esophageal cancer [facility]) Staging form: Esophagus - Adenocarcinoma, AJCC 8th Edition - Clinical stage from 12/31/2020: Stage IIA (cT1, cN1, cM0, GX) - Unsigned    Esophageal cancer ([place])  10/22/2020 Imaging   CT neck IMPRESSION: 2.8 cm right level 2A lymph node, which appears to be centrally necrotic, concerning for metastatic disease. No additional lymphadenopathy or other acute process in the neck.     11/30/2020 Imaging   Barium swallow FINDINGS: Limitations on positioning due to sequelae of stroke. The patient swallowed barium without difficulty. No aspiration. There is no mass or stricture. Normal motility. There is no hiatal hernia. No spontaneous gastroesophageal reflux. Barium pill was swallowed and passed without difficulty.   IMPRESSION: No significant abnormality.   12/08/2020 Imaging   CT chest abdomen with contrast IMPRESSION: 1. Mass is suspected just at the level of the GE junction. Suspicious for primary esophageal neoplasm. Further evaluation with endoscopy is advised. 2. Prominent perigastric lymph nodes are identified, suspicious for metastatic adenopathy. 3. Gallstone. 4. Coronary artery calcifications. 5. Aortic Atherosclerosis (ICD10-I70.0) and Emphysema (ICD10-J43.9). 6. These results will be called to the ordering clinician or representative by the Radiologist Assistant, and communication documented in the PACS or [employer].     12/17/2020 Procedure   EGD by Dr. Fuller Plan - A medium-sized, fungating mass with contact bleeding was found in the distal esophagus, 36 cm from the incisors extending to 41 cm in  the cardia. The mass was partially obstructing and circumferential.   12/17/2020 Initial Biopsy   Diagnosis Esophagus, biopsy, distal and gastric cardia mass - ADENOCARCINOMA WITH SIGNET RING FEATURES.   12/19/2020 Initial Diagnosis   Esophageal cancer ([place])   12/30/2020 PET scan   IMPRESSION: 1. Mass at the level of the GE junction and proximal stomach is FDG avid within SUV max of 7.62. Findings compatible with primary gastric neoplasm. 2. There is mild FDG uptake associated with borderline gastrohepatic ligament lymph nodes which have an SUV max of 2.97. Equivocal for nodal metastasis. 3. No signs of distant metastatic disease. 4. Aortic Atherosclerosis (ICD10-I70.0) and Emphysema (ICD10-J43.9). Coronary artery calcifications. 5. Gallstones. 6. Prostate gland enlargement.      INTERVAL HISTORY:  Jeffrey [name] was contacted for a follow up of esophageal cancer. He was last seen by me on 12/24/20 in consultation with NP Lacie.    All other systems were reviewed with the patient and are negative.  MEDICAL HISTORY:  Past Medical History:  Diagnosis Date   Anterior cerebral circulation hemorrhagic infarction [facility]) 11/24/2014   March, 2016, dominant left thalamic and left internal capsule ischemic infarct with resultant hemorrhagic transformation, secondary to small vessel disease. Resulted in right hemiparesis dysarthria and diplopia   //   readmission with aphasia July, 2016, this improved    Arthritis    CAD (coronary artery disease)    PCI distal RCA ...2004, residual 70% LAD   /   ...nuclear...03/2007...no ischemia.Marland KitchenMarland Kitchenpreserved LV /  nuclear...03/03/2009...inferior scar..no ischemia..EF 51%   Cyst of nasopharynx    per ENT Wilburn Cornelia   Dyslipidemia  takes Atorvastatin daily   GERD (gastroesophageal reflux disease)    takes Omeprazole daily   Gout    takes Allopurinol daily and Colchicine as needed   HCAP (healthcare-associated pneumonia)    Hemiparesis affecting right  side as late effect of cerebrovascular accident ([place]) 09/05/2014   History of colon polyps    HTN (hypertension)    takes Cardura,Metoprolol,[name],and Amlodipine daily   Internal hemorrhoids    Myocardial infarction [facility]) 79yr ago   Peripheral edema    takes Lasix daily   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    SURGICAL HISTORY: Past Surgical History:  Procedure Laterality Date   BALLOON ANGIOPLASTY, ARTERY  1992, 2004   CAD, Dr. KRon Parker  CARDIAC CATHETERIZATION  2004   CATARACT EXTRACTION Bilateral 2010   COLONOSCOPY  04/03/2002   adenomatous polyp, int hemorrhoids   COLONOSCOPY  07/18/2007   normal (Dr. SFuller Plan   COLONOSCOPY  09/26/2012   tubular adenoma, sm int hem, rpt 5 yrs (Fuller Plan   ELBOW SURGERY Right 1997   eye lids raised     NASAL SEPTUM SURGERY     POLYPECTOMY N/A 05/27/2013   Procedure: ENDOSCOPIC NASOPHARYNGEAL MASS;  Surgeon: DJerrell Belfast MD   ROTATOR CUFF REPAIR  09/2011   left, with subacromial decompression   TONSILLECTOMY AND ADENOIDECTOMY      I have reviewed the social history and family history with the patient and they are unchanged from previous note.  ALLERGIES:  is allergic to niacin.  MEDICATIONS:  Current Outpatient Medications  Medication Sig Dispense Refill   ACIDOPHILUS LACTOBACILLUS PO Take 2 tablets by mouth daily.     atorvastatin (LIPITOR) 80 MG tablet Take 80 mg by mouth at bedtime.     baclofen (LIORESAL) 10 MG tablet TAKE ONE TABLET BY MOUTH 3 TIMES A DAY AS NEEDED FOR SPASTICITY.     Cholecalciferol 25 MCG (1000 UT) tablet Take by mouth.     clopidogrel (PLAVIX) 75 MG tablet Take 1 tablet (75 mg total) by mouth daily.     empagliflozin (JARDIANCE) 25 MG TABS tablet Take 1 tablet by mouth daily.      fosinopril ([name]) 40 MG tablet Take 1 tablet (40 mg total) by mouth daily.     furosemide (LASIX) 20 MG tablet Take 1 tablet (20 mg total) by mouth daily AND 2 tablets (40 mg total) 3 (three) times a week. Every day 210m some days 4066m    insulin aspart (NOVOLOG FLEXPEN) 100 UNIT/ML FlexPen Inject 10 Units into the skin in the morning and at bedtime. With meals     insulin glargine (LANTUS) 100 UNIT/ML Solostar Pen Inject 24 Units into the skin at bedtime.     LACTOBACILLUS PO Take 1 capsule by mouth daily. [facility] colon [facility].  Takes M/W/F     loratadine (CLARITIN) 10 MG tablet 1 tablet     metoprolol tartrate (LOPRESSOR) 25 MG tablet Take 1 tablet (25 mg total) by mouth 2 (two) times daily.     mupirocin cream (BACTROBAN) 2 % Apply 1 application topically 2 (two) times daily. 15 g 0   mupirocin ointment (BACTROBAN) 2 %      pantoprazole (PROTONIX) 20 MG tablet TAKE 1 TABLET BY MOUTH EVERY  DAY 30 tablet 1   Potassium 99 MG TABS Take 1 tablet (99 mg total) by mouth daily as needed (when you take 2 lasix pills).     sertraline (ZOLOFT) 25 MG tablet Take 1 tablet (25 mg total) by mouth daily.     No current facility-administered medications for this visit.    PHYSICAL EXAMINATION: ECOG PERFORMANCE STATUS: 3 - Symptomatic, >50% confined to bed  There were no vitals filed for this visit. Wt Readings from Last 3 Encounters:  12/31/20 73.3 kg  12/24/20 73.1 kg  12/17/20 73.9 kg     No vitals taken today, Exam not performed today  LABORATORY DATA:  I have reviewed the data as listed CBC Latest Ref Rng & Units 12/24/2020 10/14/2020 04/08/2020  WBC 4.0 - 10.5 K/uL 6.9 6.7 7.3  Hemoglobin 13.0 - 17.0 g/dL 15.6 14.6 14.6  Hematocrit 39.0 - 52.0 % 46.6 44.5 41.9  Platelets 150 - 400 K/uL 163 162.0 179.0     CMP Latest Ref Rng & Units 12/24/2020 11/30/2020 10/14/2020  Glucose 70 - 99 mg/dL 85 71 94  BUN 8 - 23 mg/dL 18 18 25(H)  Creatinine 0.61 - 1.24 mg/dL 1.07 0.99 0.97  Sodium 135  - 145 mmol/L 140 142 142  Potassium 3.5 - 5.1 mmol/L 3.7 3.6 3.8  Chloride 98 - 111 mmol/L 104 103 106  CO2 22 - 32 mmol/L 26 32 29  Calcium 8.9 - 10.3 mg/dL 9.6 9.5 9.6  Total Protein 6.5 - 8.1 g/dL 7.5 - -  Total Bilirubin 0.3 - 1.2 mg/dL 1.2 - -  Alkaline Phos 38 - 126 U/L 107 - -  AST 15 - 41 U/L 23 - -  ALT 0 - 44 U/L 25 - -      RADIOGRAPHIC STUDIES: I have personally reviewed the radiological images as listed and agreed with the findings in the report. NM PET Image Initial (PI) Skull Base To Thigh  Addendum Date: 12/31/2020   ADDENDUM REPORT: 12/31/2020 10:26 ADDENDUM: Not mentioned in the original report is a cystic structure within the right level 2 region measuring 2.6 by 1.6 cm, image 30/4. This measures fluid density without significant radiotracer uptake, image 30/4. Etiology is indeterminate. This is indeterminate. Necrotic lymph node cannot be excluded. Further evaluation with ultrasound-guided fine needle aspiration is advised. Electronically Signed   By: Kerby Moors M.D.   On: 12/31/2020 10:26   Result Date: 12/31/2020 CLINICAL DATA:  Initial treatment strategy for gastric cancer. EXAM: NUCLEAR MEDICINE PET SKULL BASE TO THIGH TECHNIQUE: 8.0 mCi F-18 FDG was injected intravenously. Full-ring PET imaging was performed from the skull base to thigh after the radiotracer. CT data was obtained and used for attenuation correction and anatomic localization. Fasting blood glucose: 104 mg/dl COMPARISON:  CT chest and abdomen 12/08/2020 FINDINGS: Mediastinal blood pool activity: SUV max 2.56 Liver activity: SUV max NA NECK: No hypermetabolic lymph nodes in the neck. Incidental CT findings: none CHEST: No hypermetabolic mediastinal or hilar nodes. No suspicious pulmonary nodules on the CT scan. There is a small left level 3 lymph node measuring 4 mm with SUV max of 1.89, image 36/4. Incidental CT findings: Mild centrilobular and paraseptal emphysema. Aortic atherosclerosis and coronary  artery calcifications. ABDOMEN/PELVIS: No abnormal FDG uptake identified within the liver, pancreas, spleen, or adrenal glands. Mass at the level of the GE junction and proximal stomach is FDG avid within SUV max 7.62. Mild FDG uptake associated with previously described prominent gastrohepatic ligament lymph nodes. Index node  measures 0.9 cm with SUV max of 2.97, image 99/4. No FDG avid retroperitoneal, mesenteric, or pelvic lymph nodes. Incidental CT findings: Gallstones. Aortic atherosclerosis. Inferior pole right kidney cyst measures 4.4 cm. Prostate gland appears enlarged. SKELETON: No focal hypermetabolic activity to suggest skeletal metastasis. Incidental CT findings: none IMPRESSION: 1. Mass at the level of the GE junction and proximal stomach is FDG avid within SUV max of 7.62. Findings compatible with primary gastric neoplasm. 2. There is mild FDG uptake associated with borderline gastrohepatic ligament lymph nodes which have an SUV max of 2.97. Equivocal for nodal metastasis. 3. No signs of distant metastatic disease. 4. Aortic Atherosclerosis (ICD10-I70.0) and Emphysema (ICD10-J43.9). Coronary artery calcifications. 5. Gallstones. 6. Prostate gland enlargement. Electronically Signed: By: Kerby Moors M.D. On: 12/31/2020 08:28      Orders Placed This Encounter  Procedures   US SOFT TISSUE HEAD & NECK (NON-THYROID)    Standing Status:   Future    Standing Expiration Date:   12/31/2021    Order Specific Question:   Reason for Exam (SYMPTOM  OR DIAGNOSIS REQUIRED)    Answer:   rule out malignancy    Order Specific Question:   Preferred imaging location?    Answer:   [facility]    All questions were answered. The patient knows to call the clinic with any problems, questions or concerns. No barriers to learning was detected. The total time spent in the appointment was 25 minutes.     Truitt Merle, MD 12/31/2020   I, Wilburn Mylar, am acting as scribe for Truitt Merle, MD.   I  have reviewed the above documentation for accuracy and completeness, and I agree with the above.

## 2020-12-31 NOTE — Progress Notes (Signed)
Radiation Oncology         [phone] ________________________________  Name: Jeffrey [name]        MRN: [id number]  Date of Service: 12/31/2020 DOB: 1941/01/27  XT:[id number], Jeffrey Hatchet, MD  Truitt Merle, MD     REFERRING PHYSICIAN: Truitt Merle, MD   DIAGNOSIS: The encounter diagnosis was Malignant neoplasm of overlapping sites of esophagus [name]).   HISTORY OF PRESENT ILLNESS: Jeffrey [name] is a 80 y.o. male seen at the request of Dr. Burr Medico for a newly diagnosed carcinoma of the esophagus. He had multiple months of progressive dysphagia and noted a 10 pound weight loss and a palpable fullness in his right neck. He proceed with a CT of the neck on 10/22/20 that showed enlarged right level 2a adenopathy between the right internal jugular vein and right retromandibular vein measuring 2.8 cm. No additional adenopathy was noted. He had esophagram/barium swallow ordered by Jeffrey [name], PAC at [place]. This showed no visible stricture or mass but was limited for his positioning given prior stroke. He had a CT CAP on 12/08/20 that showed a mass at the GE junction measuring up to 4.2 cm with prominent perigastric adenopathy and gastrohepatic adenopathy up to 1 cm. No other adenopathy was noted or evidence of metastatic disease in the chest or abdomen. He underwent endoscopy with Dr. Fuller Plan on 12/17/20 that revealed a medium size fungating mass with contact bleeding in the distal esophagus partially obstructing and circumferential. The stomach was normal in appearance. Biopsies showed adenocarcinoma with signet ring features, negative for HER2. He underwent a PET scan yesterday and this revealed a blood pool of 2.56, there is a small left level 3 lymph node measuring 4 mm with an SUV of 1.89, the known mass in the GE junction with an SUV of 7.62 and associated uptake in the gastrohepatic ligament lymph nodes up to an SUV of 2.97, interestingly the location of the mass in the right cervical region did not show  any hypermetabolic change and an addendum was placed stating that there was a cystic structure at that level 2 region measuring up to 2.6 cm but had consistent changes with fluid density without significant radiotracer uptake.  It was recommended that he undergo a biopsy for this as well by radiology.  Additional stigmata of aortic and coronary atherosclerotic disease emphysema gallstones and prostatomegaly was noted.  He is also scheduled for a biopsy of his right cervical adenopathy on 01/08/21 with ENT. He's seen to discuss the role of radiotherapy in the treatment of his cancer.    PREVIOUS RADIATION THERAPY: No   PAST MEDICAL HISTORY:  Past Medical History:  Diagnosis Date   Anterior cerebral circulation hemorrhagic infarction [facility]) 11/24/2014   March, 2016, dominant left thalamic and left internal capsule ischemic infarct with resultant hemorrhagic transformation, secondary to small vessel disease. Resulted in right hemiparesis dysarthria and diplopia   //   readmission with aphasia July, 2016, this improved    Arthritis    CAD (coronary artery disease)    PCI distal RCA ...2004, residual 70% LAD   /   ...nuclear...03/2007...no ischemia.Marland KitchenMarland Kitchenpreserved LV /  nuclear...03/03/2009...inferior scar..no ischemia..EF 51%   Cyst of nasopharynx    per ENT Jeffrey [name]   Dyslipidemia    takes Atorvastatin daily   GERD (gastroesophageal reflux disease)    takes Omeprazole daily   Gout    takes Allopurinol daily and Colchicine as needed   HCAP (healthcare-associated pneumonia)    Hemiparesis affecting right side as  late effect of cerebrovascular accident (Jeffrey [name]) 09/05/2014   History of colon polyps    HTN (hypertension)    takes Cardura,Metoprolol,[name],and Amlodipine daily   Internal hemorrhoids    Myocardial infarction [name]) 81yr ago   Peripheral edema    takes Lasix daily   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       PAST SURGICAL HISTORY: Past Surgical History:  Procedure Laterality Date   BALLOON ANGIOPLASTY, ARTERY  1992, 2004   CAD, Dr. KRon Parker  CARDIAC CATHETERIZATION  2004   CATARACT EXTRACTION Bilateral 2010   COLONOSCOPY  04/03/2002   adenomatous polyp, int hemorrhoids   COLONOSCOPY  07/18/2007   normal (Dr. SFuller Plan   COLONOSCOPY  09/26/2012   tubular adenoma, sm int hem, rpt 5 yrs (Fuller Plan   ELBOW SURGERY Right 1997   eye lids raised     NASAL SEPTUM SURGERY     POLYPECTOMY N/A 05/27/2013   Procedure: ENDOSCOPIC NASOPHARYNGEAL MASS;  Surgeon: DJerrell Belfast MD   ROTATOR CUFF REPAIR  09/2011   left, with subacromial decompression   TONSILLECTOMY AND ADENOIDECTOMY       FAMILY HISTORY:  Family History  Problem Relation Age of Onset   Alcohol abuse Father    Heart attack Father    Cancer Sister [phone]      breast cancer   Heart attack Son    Coronary artery disease Neg Hx    Stroke Neg Hx    Diabetes Neg Hx    Colon cancer Neg Hx      SOCIAL HISTORY:  reports that he has quit smoking. He has never used smokeless tobacco. He reports that he does not drink alcohol and does not use drugs. The patient is married and a [place] He's accompanied by his daughter CClemmie Krillwho's also his H54and has an adult son who lives out of town.   ALLERGIES: Niacin   MEDICATIONS:  Current Outpatient Medications  Medication Sig Dispense Refill   ACIDOPHILUS LACTOBACILLUS PO Take 2 tablets by mouth daily.     atorvastatin (LIPITOR) 80 MG tablet Take 80 mg by mouth at bedtime.     baclofen (LIORESAL) 10 MG tablet TAKE ONE TABLET BY MOUTH 3 TIMES A DAY AS NEEDED FOR SPASTICITY.     Cholecalciferol 25 MCG (1000 UT) tablet Take by mouth.     clopidogrel (PLAVIX) 75 MG tablet Take 1 tablet (75  mg total) by mouth daily.     empagliflozin (JARDIANCE) 25 MG TABS tablet Take 1 tablet by mouth daily.     fosinopril ([name]) 40 MG tablet Take 1 tablet (40 mg total) by mouth daily.     furosemide (LASIX) 20 MG tablet Take 1 tablet (20 mg total) by mouth daily AND 2 tablets (40 mg total) 3 (three) times a week. Every day 258m some days 4025m    insulin aspart (NOVOLOG FLEXPEN) 100 UNIT/ML FlexPen Inject 10 Units into the skin in the morning and at bedtime. With meals     insulin glargine (LANTUS) 100 UNIT/ML Solostar Pen Inject 24 Units into the skin at bedtime.     LACTOBACILLUS PO Take 1 capsule by mouth daily. [facility] colon [facility].  Takes M/W/F     loratadine (CLARITIN) 10 MG tablet 1 tablet     metoprolol tartrate (LOPRESSOR) 25 MG tablet Take 1 tablet (25 mg total) by mouth 2 (two) times daily.     mupirocin cream (BACTROBAN) 2 % Apply 1 application topically 2 (two) times daily. 15 g 0   mupirocin ointment (BACTROBAN) 2 %      pantoprazole (PROTONIX) 20 MG tablet TAKE 1 TABLET BY MOUTH EVERY DAY 30 tablet 1   Potassium 99 MG TABS Take 1 tablet (99 mg total) by mouth daily as needed (when you take 2 lasix pills).     sertraline (ZOLOFT) 25 MG tablet Take 1 tablet (25 mg total) by mouth daily.     No current facility-administered medications for this encounter.     REVIEW OF SYSTEMS: On review of systems, the patient reports that he has been able to tolerate most soft foods and certainly liquids but has to be very careful about blood sugar changes due to his dependency on insulin.  He is lost about 10 to 15 pounds in the last 8 months and has had progressive symptoms over that timeframe with his dysphagia.  He states that since his stroke he has been limited with the use of his right arm he does have what appears to be a frozen shoulder on the right side as well.  No other complaints are verbalized.    PHYSICAL EXAM:  Wt Readings from Last 3 Encounters:  12/31/20 161 lb 9.6 oz  (73.3 kg)  12/24/20 161 lb 3.2 oz (73.1 kg)  12/17/20 163 lb (73.9 kg)   Temp Readings from Last 3 Encounters:  12/31/20 97.7 F (36.5 C)  12/24/20 98.6 F (37 C) (Oral)  12/17/20 (!) 97.1 F (36.2 C)   BP Readings from Last 3 Encounters:  12/31/20 (!) 160/86  12/24/20 (!) 154/81  12/17/20 (!) 161/86   Pulse Readings from Last 3 Encounters:  12/31/20 64  12/24/20 (!) 57  12/17/20 70   Pain Assessment Pain Score: 0-No pain/10  In general this is a well appearing caucasian male in no acute distress. He's alert and oriented x4 and appropriate throughout the examination. Cardiopulmonary assessment is negative for acute distress and he exhibits normal effort.     ECOG = 1  0 - Asymptomatic (Fully active, able to carry on all predisease activities without restriction)  1 - Symptomatic but completely ambulatory (Restricted in physically strenuous activity but ambulatory and able to carry out work of a light or sedentary nature. For example, light housework, office work)  2 - Symptomatic, <50% in bed during the day (Ambulatory and capable of all self care but unable to carry out any work activities. Up and about more than 50% of waking hours)  3 - Symptomatic, >50% in bed, but not bedbound (Capable of only limited self-care, confined to bed or chair 50% or more of waking hours)  4 - Bedbound (Completely disabled. Cannot carry on any self-care. Totally confined to bed or chair)  5 - Death   Oken MM, Creech RH, Tormey DC, et al. (1982). "Toxicity and response criteria of the Eastern Cooperative Oncology Group". Am. J. Clin. Oncol. 5 (6): 649-55    LABORATORY DATA:  Lab Results  Component Value Date   WBC 6.9 12/24/2020   HGB 15.6 12/24/2020   HCT 46.6 12/24/2020   MCV 87.4 12/24/2020   PLT 163 12/24/2020   Lab Results  Component Value Date   NA 140 12/24/2020     K 3.7 12/24/2020   CL 104 12/24/2020   CO2 26 12/24/2020   Lab Results  Component Value Date   ALT 25  12/24/2020   AST 23 12/24/2020   ALKPHOS 107 12/24/2020   BILITOT 1.2 12/24/2020      RADIOGRAPHY: CT CHEST W CONTRAST  Result Date: 12/09/2020 CLINICAL DATA:  Loss of weight. Enlarged cervical lymph nodes. Difficulty swallowing. EXAM: CT CHEST AND ABDOMEN WITH CONTRAST TECHNIQUE: Multidetector CT imaging of the chest, abdomen and pelvis was performed following the standard protocol during bolus administration of intravenous contrast. CONTRAST:  4m OMNIPAQUE IOHEXOL 350 MG/ML SOLN COMPARISON:  CT chest 06/09/14 FINDINGS: CT CHEST FINDINGS Cardiovascular: Normal heart size. No pericardial effusion. Aortic atherosclerosis and coronary artery calcifications. Mediastinum/Nodes: No enlarged mediastinal, hilar, or axillary lymph nodes. Thyroid gland, trachea, and esophagus demonstrate no significant findings. Lungs/Pleura: Mild paraseptal emphysema. No pleural effusion, airspace consolidation, or atelectasis. No suspicious lung nodules. Musculoskeletal: No chest wall mass or suspicious bone lesions identified. CT ABDOMEN FINDINGS Hepatobiliary: No focal liver abnormality is seen. Gallstone noted measuring 7 mm. No signs of gallbladder wall inflammation or bile duct dilatation. Pancreas: Unremarkable. No pancreatic ductal dilatation or surrounding inflammatory changes. Spleen: Normal in size without focal abnormality. Adrenals/Urinary Tract: Normal adrenal glands. Right kidney cyst measures 4.2 by 4.2 cm. No suspicious mass or hydronephrosis identified bilaterally. Stomach/Bowel: A mass is suspected with epicenter at the GE junction measuring this is best seen on the sagittal images measuring 3.8 x 4.2 cm, image 105/7. The visualized bowel loops are unremarkable without signs of wall thickening, inflammation or distension. Vascular/Lymphatic: Aortic atherosclerosis. Prominent perigastric lymph nodes are identified in the gastro Paddock ligament region. Index node measures 1 cm, image 52/3. No porta hepatic,  retroperitoneal or mesenteric nodes. Other: No abdominal wall hernia or abnormality. No abdominopelvic ascites. Musculoskeletal: No acute or significant osseous findings. IMPRESSION: 1. Mass is suspected just at the level of the GE junction. Suspicious for primary esophageal neoplasm. Further evaluation with endoscopy is advised. 2. Prominent perigastric lymph nodes are identified, suspicious for metastatic adenopathy. 3. Gallstone. 4. Coronary artery calcifications. 5. Aortic Atherosclerosis (ICD10-I70.0) and Emphysema (ICD10-J43.9). 6. These results will be called to the ordering clinician or representative by the Radiologist Assistant, and communication documented in the PACS or [employer] Electronically Signed   By: TKerby MoorsM.D.   On: 12/09/2020 18:13   CT ABDOMEN W CONTRAST  Result Date: 12/09/2020 CLINICAL DATA:  Loss of weight. Enlarged cervical lymph nodes. Difficulty swallowing. EXAM: CT CHEST AND ABDOMEN WITH CONTRAST TECHNIQUE: Multidetector CT imaging of the chest, abdomen and pelvis was performed following the standard protocol during bolus administration of intravenous contrast. CONTRAST:  837mOMNIPAQUE IOHEXOL 350 MG/ML SOLN COMPARISON:  CT chest 06/09/14 FINDINGS: CT CHEST FINDINGS Cardiovascular: Normal heart size. No pericardial effusion. Aortic atherosclerosis and coronary artery calcifications. Mediastinum/Nodes: No enlarged mediastinal, hilar, or axillary lymph nodes. Thyroid gland, trachea, and esophagus demonstrate no significant findings. Lungs/Pleura: Mild paraseptal emphysema. No pleural effusion, airspace consolidation, or atelectasis. No suspicious lung nodules. Musculoskeletal: No chest wall mass or suspicious bone lesions identified. CT ABDOMEN FINDINGS Hepatobiliary: No focal liver abnormality is seen. Gallstone noted measuring 7 mm. No signs of gallbladder wall inflammation or bile duct dilatation. Pancreas: Unremarkable. No pancreatic ductal dilatation or surrounding  inflammatory changes. Spleen: Normal in size without focal abnormality. Adrenals/Urinary Tract: Normal adrenal glands. Right kidney cyst measures 4.2 by 4.2 cm. No suspicious mass or hydronephrosis identified bilaterally. Stomach/Bowel: A mass is suspected with  epicenter at the Coward junction measuring this is best seen on the sagittal images measuring 3.8 x 4.2 cm, image 105/7. The visualized bowel loops are unremarkable without signs of wall thickening, inflammation or distension. Vascular/Lymphatic: Aortic atherosclerosis. Prominent perigastric lymph nodes are identified in the gastro Paddock ligament region. Index node measures 1 cm, image 52/3. No porta hepatic, retroperitoneal or mesenteric nodes. Other: No abdominal wall hernia or abnormality. No abdominopelvic ascites. Musculoskeletal: No acute or significant osseous findings. IMPRESSION: 1. Mass is suspected just at the level of the GE junction. Suspicious for primary esophageal neoplasm. Further evaluation with endoscopy is advised. 2. Prominent perigastric lymph nodes are identified, suspicious for metastatic adenopathy. 3. Gallstone. 4. Coronary artery calcifications. 5. Aortic Atherosclerosis (ICD10-I70.0) and Emphysema (ICD10-J43.9). 6. These results will be called to the ordering clinician or representative by the Radiologist Assistant, and communication documented in the PACS or [employer]. Electronically Signed   By: Kerby Moors M.D.   On: 12/09/2020 18:13   NM PET Image Initial (PI) Skull Base To Thigh  Addendum Date: 12/31/2020   ADDENDUM REPORT: 12/31/2020 10:26 ADDENDUM: Not mentioned in the original report is a cystic structure within the right level 2 region measuring 2.6 by 1.6 cm, image 30/4. This measures fluid density without significant radiotracer uptake, image 30/4. Etiology is indeterminate. This is indeterminate. Necrotic lymph node cannot be excluded. Further evaluation with ultrasound-guided fine needle aspiration is  advised. Electronically Signed   By: Kerby Moors M.D.   On: 12/31/2020 10:26   Result Date: 12/31/2020 CLINICAL DATA:  Initial treatment strategy for gastric cancer. EXAM: NUCLEAR MEDICINE PET SKULL BASE TO THIGH TECHNIQUE: 8.0 mCi F-18 FDG was injected intravenously. Full-ring PET imaging was performed from the skull base to thigh after the radiotracer. CT data was obtained and used for attenuation correction and anatomic localization. Fasting blood glucose: 104 mg/dl COMPARISON:  CT chest and abdomen 12/08/2020 FINDINGS: Mediastinal blood pool activity: SUV max 2.56 Liver activity: SUV max NA NECK: No hypermetabolic lymph nodes in the neck. Incidental CT findings: none CHEST: No hypermetabolic mediastinal or hilar nodes. No suspicious pulmonary nodules on the CT scan. There is a small left level 3 lymph node measuring 4 mm with SUV max of 1.89, image 36/4. Incidental CT findings: Mild centrilobular and paraseptal emphysema. Aortic atherosclerosis and coronary artery calcifications. ABDOMEN/PELVIS: No abnormal FDG uptake identified within the liver, pancreas, spleen, or adrenal glands. Mass at the level of the GE junction and proximal stomach is FDG avid within SUV max 7.62. Mild FDG uptake associated with previously described prominent gastrohepatic ligament lymph nodes. Index node measures 0.9 cm with SUV max of 2.97, image 99/4. No FDG avid retroperitoneal, mesenteric, or pelvic lymph nodes. Incidental CT findings: Gallstones. Aortic atherosclerosis. Inferior pole right kidney cyst measures 4.4 cm. Prostate gland appears enlarged. SKELETON: No focal hypermetabolic activity to suggest skeletal metastasis. Incidental CT findings: none IMPRESSION: 1. Mass at the level of the GE junction and proximal stomach is FDG avid within SUV max of 7.62. Findings compatible with primary gastric neoplasm. 2. There is mild FDG uptake associated with borderline gastrohepatic ligament lymph nodes which have an SUV max of  2.97. Equivocal for nodal metastasis. 3. No signs of distant metastatic disease. 4. Aortic Atherosclerosis (ICD10-I70.0) and Emphysema (ICD10-J43.9). Coronary artery calcifications. 5. Gallstones. 6. Prostate gland enlargement. Electronically Signed: By: Kerby Moors M.D. On: 12/31/2020 08:28       IMPRESSION/PLAN: 1. At least Stage IIA, [id number] Adenocarcinoma of the GE junction. Dr.  Moody discusses the pathology findings and reviews the nature of locally advanced disease of the GE junction.  We need to sample his right cervical node to rule out metastatic disease even though his PET scan was reassuring. We discussed the risks, benefits, short, and long term effects of radiotherapy, as well as the curative intent, and the patient is interested in proceeding. Dr. Moody discusses the delivery and logistics of radiotherapy and anticipates a course of 5 1/2 weeks of radiotherapy. Written consent is obtained and placed in the chart, a copy was provided to the patient. He will be contacted to coordinate simulation by our staff. We anticipate starting treatmnet on 01/18/21 based on the need to sample the cervical node.  2. Insulin Dependant Diabetes. I've encouraged him to follow up with his providers who manage this so they are aware that side effects of treatment that could cause weight loss, or the need to supplement with foods that are texturally more tolerated could cause changes in his blood sugars that need tigheter control and supervision to direct medication dosing.  In a visit lasting 60 minutes, greater than 50% of the time was spent face to face discussing the patient's condition, in preparation for the discussion, and coordinating the patient's care.   The above documentation reflects my direct findings during this shared patient visit. Please see the separate note by Dr. Moody on this date for the remainder of the patient's plan of care.    Alison C. Perkins, PAC   **Disclaimer: This note  was dictated with voice recognition software. Similar sounding words can inadvertently be transcribed and this note may contain transcription errors which may not have been corrected upon publication of note.** 

## 2021-01-01 ENCOUNTER — Telehealth: Payer: Self-pay | Admitting: Hematology

## 2021-01-01 ENCOUNTER — Telehealth: Payer: Self-pay | Admitting: Nutrition

## 2021-01-01 ENCOUNTER — Telehealth: Payer: Self-pay | Admitting: Family Medicine

## 2021-01-01 ENCOUNTER — Ambulatory Visit: Payer: PPO | Admitting: Nutrition

## 2021-01-01 NOTE — Telephone Encounter (Signed)
Scheduled follow-up appointments per 10/27 los. Patient's daughter is aware.

## 2021-01-01 NOTE — Progress Notes (Signed)
See telephone note.

## 2021-01-01 NOTE — Telephone Encounter (Signed)
Telephone consult completed with patient and daughter.  80 year old male diagnosed with esophagus cancer and followed by Dr. Burr Medico.  Plan is for concurrent chemoradiation therapy per patient.  Past medical history includes stroke, CAD, dyslipidemia, GERD, gout, hemiparesis, hypertension, MI, diabetes type 2.  Medications include Jardiance, Lasix, NovoLog, Lantus, Protonix, potassium.  Labs reviewed.  Height: 67 inches. Weight: 161.2 pounds October 20. Usual body weight: 171 pounds in March 2022. BMI: 25.25. ECOG: 1.  Patient endorses 10-13 pounds weight loss.  This is a 6% weight loss over 7 months. He endorses dysphagia but reports as long as he takes small bites and chews well he can eat regular textures. Reports increased gas and has between 3 and 4 bowel movements every day. Denies nausea and vomiting. Patient enjoys 2% milk.  He has been following a lower carbohydrate, lower sodium diet.  Nutrition diagnosis: Unintended weight loss related to esophagus cancer as evidenced by 10 pound weight loss over 7 months.  Intervention: Educated to consume smaller more frequent meals and snacks consisting of higher calorie, higher protein foods. Encouraged patient to liberalize strict dietary restrictions of sodium and glucose. Recommended patient try Carnation breakfast essentials mixed with 2% milk.  This comes in a no added sugar version if he prefers. Educated on appropriate soft snacks.  Encouraged extra moisture at mealtimes. Will mail nutrition fact sheets including swallowing difficulties, high-calorie high-protein diet, high calorie food list and snack ideas, gas, and recipes. Questions were answered.  Teach back method used.  Contact information provided.  Monitoring, evaluation, goals: Patient will tolerate increased calories and protein to minimize further weight loss.  Next visit: To be scheduled with weekly treatments.  **Disclaimer: This note was dictated with voice  recognition software. Similar sounding words can inadvertently be transcribed and this note may contain transcription errors which may not have been corrected upon publication of note.**

## 2021-01-01 NOTE — Telephone Encounter (Signed)
Spoke w/ daughter [name] about dad's course and treatment plan to date.

## 2021-01-05 ENCOUNTER — Other Ambulatory Visit: Payer: Self-pay

## 2021-01-05 ENCOUNTER — Inpatient Hospital Stay: Payer: PPO

## 2021-01-05 ENCOUNTER — Ambulatory Visit: Payer: PPO | Admitting: Hematology

## 2021-01-05 DIAGNOSIS — C159 Malignant neoplasm of esophagus, unspecified: Secondary | ICD-10-CM | POA: Insufficient documentation

## 2021-01-05 DIAGNOSIS — Z5111 Encounter for antineoplastic chemotherapy: Secondary | ICD-10-CM | POA: Insufficient documentation

## 2021-01-05 DIAGNOSIS — C160 Malignant neoplasm of cardia: Secondary | ICD-10-CM | POA: Insufficient documentation

## 2021-01-06 ENCOUNTER — Other Ambulatory Visit: Payer: Self-pay

## 2021-01-06 ENCOUNTER — Ambulatory Visit (HOSPITAL_COMMUNITY): Payer: PPO

## 2021-01-06 ENCOUNTER — Encounter: Payer: Self-pay | Admitting: Hematology

## 2021-01-06 NOTE — Progress Notes (Signed)
The proposed treatment discussed in conference is for discussion purpose only and is not a binding recommendation.  The patients have not been physically examined, or presented with their treatment options.  Therefore, final treatment plans cannot be decided.  

## 2021-01-07 ENCOUNTER — Encounter: Payer: Self-pay | Admitting: Hematology

## 2021-01-08 ENCOUNTER — Ambulatory Visit (HOSPITAL_COMMUNITY)
Admission: RE | Admit: 2021-01-08 | Discharge: 2021-01-08 | Disposition: A | Discharge status: Home / Self Care | Payer: PPO | Source: Ambulatory Visit | Attending: Hematology | Admitting: Hematology

## 2021-01-08 ENCOUNTER — Other Ambulatory Visit: Payer: Self-pay

## 2021-01-08 ENCOUNTER — Ambulatory Visit
Admission: RE | Admit: 2021-01-08 | Discharge: 2021-01-08 | Disposition: A | Discharge status: Home / Self Care | Payer: PPO | Source: Ambulatory Visit | Attending: Radiation Oncology | Admitting: Radiation Oncology

## 2021-01-08 DIAGNOSIS — C155 Malignant neoplasm of lower third of esophagus: Secondary | ICD-10-CM | POA: Diagnosis not present

## 2021-01-08 DIAGNOSIS — C160 Malignant neoplasm of cardia: Secondary | ICD-10-CM | POA: Insufficient documentation

## 2021-01-08 DIAGNOSIS — R221 Localized swelling, mass and lump, neck: Secondary | ICD-10-CM | POA: Diagnosis not present

## 2021-01-08 DIAGNOSIS — Z510 Encounter for antineoplastic radiation therapy: Secondary | ICD-10-CM | POA: Insufficient documentation

## 2021-01-08 DIAGNOSIS — Z5111 Encounter for antineoplastic chemotherapy: Secondary | ICD-10-CM | POA: Insufficient documentation

## 2021-01-09 ENCOUNTER — Other Ambulatory Visit: Payer: Self-pay | Admitting: Family Medicine

## 2021-01-11 ENCOUNTER — Encounter: Payer: Self-pay | Admitting: General Practice

## 2021-01-11 NOTE — Progress Notes (Signed)
[facility] Spiritual Care Note  Reached out to daughter Clemmie Krill for caregiver support per referral from Kem Parkinson, leaving voicemail with encouragement to return call. Will follow for caregiver support.   [place], [place], [facility] Pager [phone] Voicemail [phone]

## 2021-01-11 NOTE — Progress Notes (Signed)
Pharmacist Chemotherapy Monitoring - Initial Assessment    Anticipated start date: 01/18/21   The following has been reviewed per standard work regarding the patient's treatment regimen: The patient's diagnosis, treatment plan and drug doses, and organ/hematologic function Lab orders and baseline tests specific to treatment regimen  The treatment plan start date, drug sequencing, and pre-medications Prior authorization status  Patient's documented medication list, including drug-drug interaction screen and prescriptions for anti-emetics and supportive care specific to the treatment regimen The drug concentrations, fluid compatibility, administration routes, and timing of the medications to be used The patient's access for treatment and lifetime cumulative dose history, if applicable  The patient's medication allergies and previous infusion related reactions, if applicable   Changes made to treatment plan:  N/A  Follow up needed:  N/A  Benn Moulder, PharmD Pharmacy Resident  01/11/2021 8:55 AM

## 2021-01-11 NOTE — Progress Notes (Signed)
Mr osowski' daughter [name] called.  She requested information regarding care giver support group.  I provided the phone number for her to call.  She is anxious regarding her father's upcoming treatment.  I reviewed typical side effects.  I have sent a message to Spiritual [facility] to reach out to her for support.  All questions were answered.  She verbalized understainding.

## 2021-01-12 ENCOUNTER — Other Ambulatory Visit: Payer: Self-pay

## 2021-01-12 DIAGNOSIS — R590 Localized enlarged lymph nodes: Secondary | ICD-10-CM

## 2021-01-12 DIAGNOSIS — C155 Malignant neoplasm of lower third of esophagus: Secondary | ICD-10-CM

## 2021-01-13 ENCOUNTER — Telehealth: Payer: Self-pay

## 2021-01-13 ENCOUNTER — Telehealth: Payer: Self-pay | Admitting: Family Medicine

## 2021-01-13 NOTE — Telephone Encounter (Signed)
Upcoming procedure: US biopsy of R neck mass, not hypermetabolic on PET scan.  Blood thinner: plavix 75mg  daily  Indication for blood thinner: L thalamic stroke 2016 with hemorrhagic conversion as well as CAD s/p PCI of distal RCA 2004.  Request to hold plavix for 5 days.   Would prefer continuing plavix but if deemed necessary by IR, ok to hold plavix for 5 days and would suggest aspirin 81mg  daily in interim while plavix held, if ok by IR.  Rec restart plavix as soon as able.

## 2021-01-13 NOTE — Telephone Encounter (Signed)
-----   Message from Jillyn Hidden sent at 01/12/2021  5:33 PM EST ----- Regarding: US Biopsy / Blood thinner Good evening, Dr. Danise Mina, Mr Klonowski has an order placed to have a biopsy done. I see that patient is on Plavix and was placed on it under your care. Please advise if it is okay to hold for 5 days prior to his biopsy. Thanks Aniceto Boss

## 2021-01-13 NOTE — Telephone Encounter (Signed)
Pt daughter called regarding test results and possibility of interference with upcoming treatment. Results have not been reviewed yet, advised daughter that once they are reviewed we could answer any questions she may have. Daughter agreeable, no further questions at this time.

## 2021-01-14 DIAGNOSIS — I10 Essential (primary) hypertension: Secondary | ICD-10-CM | POA: Diagnosis not present

## 2021-01-14 DIAGNOSIS — Z993 Dependence on wheelchair: Secondary | ICD-10-CM | POA: Diagnosis not present

## 2021-01-14 DIAGNOSIS — Z87891 Personal history of nicotine dependence: Secondary | ICD-10-CM | POA: Diagnosis not present

## 2021-01-14 DIAGNOSIS — I69351 Hemiplegia and hemiparesis following cerebral infarction affecting right dominant side: Secondary | ICD-10-CM | POA: Diagnosis not present

## 2021-01-14 DIAGNOSIS — Z794 Long term (current) use of insulin: Secondary | ICD-10-CM | POA: Diagnosis not present

## 2021-01-14 DIAGNOSIS — E119 Type 2 diabetes mellitus without complications: Secondary | ICD-10-CM | POA: Diagnosis not present

## 2021-01-15 MED FILL — Dexamethasone Sodium Phosphate Inj 100 MG/10ML: INTRAMUSCULAR | Qty: 1 | Status: AC

## 2021-01-17 DIAGNOSIS — Z510 Encounter for antineoplastic radiation therapy: Secondary | ICD-10-CM | POA: Diagnosis not present

## 2021-01-17 DIAGNOSIS — C160 Malignant neoplasm of cardia: Secondary | ICD-10-CM | POA: Diagnosis not present

## 2021-01-17 DIAGNOSIS — Z5111 Encounter for antineoplastic chemotherapy: Secondary | ICD-10-CM | POA: Diagnosis not present

## 2021-01-18 ENCOUNTER — Encounter (HOSPITAL_COMMUNITY): Payer: Self-pay

## 2021-01-18 ENCOUNTER — Other Ambulatory Visit: Payer: Self-pay

## 2021-01-18 ENCOUNTER — Inpatient Hospital Stay: Payer: PPO

## 2021-01-18 ENCOUNTER — Encounter: Payer: Self-pay | Admitting: Hematology

## 2021-01-18 ENCOUNTER — Inpatient Hospital Stay (HOSPITAL_BASED_OUTPATIENT_CLINIC_OR_DEPARTMENT_OTHER): Payer: PPO | Admitting: Hematology

## 2021-01-18 ENCOUNTER — Ambulatory Visit
Admission: RE | Admit: 2021-01-18 | Discharge: 2021-01-18 | Disposition: A | Discharge status: Home / Self Care | Payer: PPO | Source: Ambulatory Visit | Attending: Radiation Oncology | Admitting: Radiation Oncology

## 2021-01-18 VITALS — BP 146/88 | HR 60 | Temp 98.0°F | Resp 18 | Ht 68.0 in | Wt 159.8 lb

## 2021-01-18 VITALS — BP 178/77 | HR 52 | Temp 98.2°F | Resp 17

## 2021-01-18 DIAGNOSIS — C155 Malignant neoplasm of lower third of esophagus: Secondary | ICD-10-CM

## 2021-01-18 DIAGNOSIS — C158 Malignant neoplasm of overlapping sites of esophagus: Secondary | ICD-10-CM

## 2021-01-18 DIAGNOSIS — C160 Malignant neoplasm of cardia: Secondary | ICD-10-CM | POA: Diagnosis not present

## 2021-01-18 DIAGNOSIS — Z510 Encounter for antineoplastic radiation therapy: Secondary | ICD-10-CM | POA: Diagnosis not present

## 2021-01-18 LAB — CMP (CANCER CENTER ONLY)
ALT: 27 U/L (ref 0–44)
AST: 21 U/L (ref 15–41)
Albumin: 4.1 g/dL (ref 3.5–5.0)
Alkaline Phosphatase: 127 U/L — ABNORMAL HIGH (ref 38–126)
Anion gap: 8 (ref 5–15)
BUN: 20 mg/dL (ref 8–23)
CO2: 30 mmol/L (ref 22–32)
Calcium: 9.6 mg/dL (ref 8.9–10.3)
Chloride: 105 mmol/L (ref 98–111)
Creatinine: 1.07 mg/dL (ref 0.61–1.24)
GFR, Estimated: >60 mL/min (ref >60)
Glucose, Bld: 171 mg/dL — ABNORMAL HIGH (ref 70–99)
Potassium: 4 mmol/L (ref 3.5–5.1)
Sodium: 143 mmol/L (ref 135–145)
Total Bilirubin: 0.8 mg/dL (ref 0.3–1.2)
Total Protein: 7.4 g/dL (ref 6.5–8.1)

## 2021-01-18 LAB — CBC WITH DIFFERENTIAL (CANCER CENTER ONLY)
Abs Immature Granulocytes: 0.01 10*3/uL (ref 0.00–0.07)
Basophils Absolute: 0.1 10*3/uL (ref 0.0–0.1)
Basophils Relative: 1 %
Eosinophils Absolute: 0.1 10*3/uL (ref 0.0–0.5)
Eosinophils Relative: 2 %
HCT: 47.5 % (ref 39.0–52.0)
Hemoglobin: 15.6 g/dL (ref 13.0–17.0)
Immature Granulocytes: 0 %
Lymphocytes Relative: 17 %
Lymphs Abs: 1.2 10*3/uL (ref 0.7–4.0)
MCH: 29.1 pg (ref 26.0–34.0)
MCHC: 32.8 g/dL (ref 30.0–36.0)
MCV: 88.5 fL (ref 80.0–100.0)
Monocytes Absolute: 0.5 10*3/uL (ref 0.1–1.0)
Monocytes Relative: 7 %
Neutro Abs: 5.3 10*3/uL (ref 1.7–7.7)
Neutrophils Relative %: 73 %
Platelet Count: 167 10*3/uL (ref 150–400)
RBC: 5.37 MIL/uL (ref 4.22–5.81)
RDW: 13.3 % (ref 11.5–15.5)
WBC Count: 7.2 10*3/uL (ref 4.0–10.5)
nRBC: 0 % (ref 0.0–0.2)

## 2021-01-18 LAB — CEA (IN HOUSE-CHCC): CEA (CHCC-In House): 3.78 ng/mL (ref 0.00–5.00)

## 2021-01-18 MED ORDER — SODIUM CHLORIDE 0.9 % IV SOLN
Freq: Once | INTRAVENOUS | Status: AC
Start: 2021-01-18 — End: 2021-01-18

## 2021-01-18 MED ORDER — PALONOSETRON HCL INJECTION 0.25 MG/5ML
0.2500 mg | Freq: Once | INTRAVENOUS | Status: AC
  Administered 2021-01-18: 0.25 mg via INTRAVENOUS
  Filled 2021-01-18: qty 5

## 2021-01-18 MED ORDER — SODIUM CHLORIDE 0.9 % IV SOLN
50.0000 mg/m2 | Freq: Once | INTRAVENOUS | Status: AC
  Administered 2021-01-18: 96 mg via INTRAVENOUS
  Filled 2021-01-18: qty 16

## 2021-01-18 MED ORDER — SODIUM CHLORIDE 0.9 % IV SOLN
10.0000 mg | Freq: Once | INTRAVENOUS | Status: AC
  Administered 2021-01-18: 10 mg via INTRAVENOUS
  Filled 2021-01-18: qty 10

## 2021-01-18 MED ORDER — FAMOTIDINE 20 MG IN NS 100 ML IVPB
20.0000 mg | Freq: Once | INTRAVENOUS | Status: AC
  Administered 2021-01-18: 20 mg via INTRAVENOUS
  Filled 2021-01-18: qty 100

## 2021-01-18 MED ORDER — SODIUM CHLORIDE 0.9 % IV SOLN
126.0000 mg | Freq: Once | INTRAVENOUS | Status: AC
  Administered 2021-01-18: 130 mg via INTRAVENOUS
  Filled 2021-01-18: qty 13

## 2021-01-18 MED ORDER — DIPHENHYDRAMINE HCL 50 MG/ML IJ SOLN
50.0000 mg | Freq: Once | INTRAMUSCULAR | Status: AC
  Administered 2021-01-18: 50 mg via INTRAVENOUS
  Filled 2021-01-18: qty 1

## 2021-01-18 NOTE — Patient Instructions (Signed)
[facility]ICAL [facility]COLOG[facility]  Discharge Instructions: °Thank you for choosing [facility] to provide your oncology and hematology care.  ° °If you have a lab appointment with the Cancer Center, please go directly to the Cancer Center and check in at the registration area. °  °Wear comfortable clothing and clothing appropriate for easy access to any Portacath or PICC line.  ° °We strive to give you quality time with your provider. You may need to reschedule your appointment if you arrive late (15 or more minutes).  Arriving late affects you and other patients whose appointments are after yours.  Also, if you miss three or more appointments without notifying the office, you may be dismissed from the clinic at the provider’s discretion.    °  °For prescription refill requests, have your pharmacy contact our office and allow 72 hours for refills to be completed.   ° °Today you received the following chemotherapy and/or immunotherapy agents: Taxol/Carbo.    °  °To help prevent nausea and vomiting after your treatment, we encourage you to take your nausea medication as directed. ° °BELOW ARE SYMPTOMS THAT SHOULD BE REPORTED IMMEDIATELY: °*FEVER GREATER THAN 100.4 F (38 °C) OR HIGHER °*CHILLS OR SWEATING °*NAUSEA AND VOMITING THAT IS NOT CONTROLLED WITH YOUR NAUSEA MEDICATION °*UNUSUAL SHORTNESS OF BREATH °*UNUSUAL BRUISING OR BLEEDING °*URINARY PROBLEMS (pain or burning when urinating, or frequent urination) °*BOWEL PROBLEMS (unusual diarrhea, constipation, pain near the anus) °TENDERNESS IN MOUTH AND THROAT WITH OR WITHOUT PRESENCE OF ULCERS (sore throat, sores in mouth, or a toothache) °UNUSUAL RASH, SWELLING OR PAIN  °UNUSUAL VAGINAL DISCHARGE OR ITCHING  ° °Items with * indicate a potential emergency and should be followed up as soon as possible or go to the Emergency Department if any problems should occur. ° °Please show the CHEMOTHERAPY ALERT CARD or IMMUNOTHERAPY ALERT CARD at check-in  to the Emergency Department and triage nurse. ° °Should you have questions after your visit or need to cancel or reschedule your appointment, please contact [facility]  Dept: [phone]  and follow the prompts.  Office hours are 8:00 a.m. to 4:30 p.m. Monday - Friday. Please note that voicemails left after 4:00 p.m. may not be returned until the following business day.  We are closed weekends and major holidays. You have access to a nurse at all times for urgent questions. Please call the main number to the clinic Dept: [phone] and follow the prompts. ° ° °For any non-urgent questions, you may also contact your provider using MyChart. We now offer e-Visits for anyone 18 and older to request care online for non-urgent symptoms. For details visit [email or web address]. °  °Also download the MyChart app! Go to the app store, search "MyChart", open the app, select [facility], and log in with your MyChart username and password. ° °Due to Covid, a mask is required upon entering the hospital/clinic. If you do not have a mask, one will be given to you upon arrival. For doctor visits, patients may have 1 support person aged 18 or older with them. For treatment visits, patients cannot have anyone with them due to current Covid guidelines and our immunocompromised population.  ° °Paclitaxel injection °What is this medication? °PACLITAXEL (PAK li TAX el) is a chemotherapy drug. It targets fast dividing cells, like cancer cells, and causes these cells to die. This medicine is used to treat ovarian cancer, breast cancer, lung cancer, Kaposi's sarcoma, and other cancers. °This medicine may   be used for other purposes; ask your health care provider or pharmacist if you have questions. °COMMON BRAND NAME(S): Onxol, Taxol °What should I tell my care team before I take this medication? °They need to know if you have any of these conditions: °history of irregular heartbeat °liver disease °low  blood counts, like low white cell, platelet, or red cell counts °lung or breathing disease, like asthma °tingling of the fingers or toes, or other nerve disorder °an unusual or allergic reaction to paclitaxel, alcohol, polyoxyethylated castor oil, other chemotherapy, other medicines, foods, dyes, or preservatives °pregnant or trying to get pregnant °breast-feeding °How should I use this medication? °This drug is given as an infusion into a vein. It is administered in a hospital or clinic by a specially trained health care professional. °Talk to your pediatrician regarding the use of this medicine in children. Special care may be needed. °Overdosage: If you think you have taken too much of this medicine contact a poison control center or emergency room at once. °NOTE: This medicine is only for you. Do not share this medicine with others. °What if I miss a dose? °It is important not to miss your dose. Call your doctor or health care professional if you are unable to keep an appointment. °What may interact with this medication? °Do not take this medicine with any of the following medications: °live virus vaccines °This medicine may also interact with the following medications: °antiviral medicines for hepatitis, HIV or AIDS °certain antibiotics like erythromycin and clarithromycin °certain medicines for fungal infections like ketoconazole and itraconazole °certain medicines for seizures like carbamazepine, phenobarbital, phenytoin °gemfibrozil °nefazodone °rifampin °St. John's wort °This list may not describe all possible interactions. Give your health care provider a list of all the medicines, herbs, non-prescription drugs, or dietary supplements you use. Also tell them if you smoke, drink alcohol, or use illegal drugs. Some items may interact with your medicine. °What should I watch for while using this medication? °Your condition will be monitored carefully while you are receiving this medicine. You will need  important blood work done while you are taking this medicine. °This medicine can cause serious allergic reactions. To reduce your risk you will need to take other medicine(s) before treatment with this medicine. If you experience allergic reactions like skin rash, itching or hives, swelling of the face, lips, or tongue, tell your doctor or health care professional right away. °In some cases, you may be given additional medicines to help with side effects. Follow all directions for their use. °This drug may make you feel generally unwell. This is not uncommon, as chemotherapy can affect healthy cells as well as cancer cells. Report any side effects. Continue your course of treatment even though you feel ill unless your doctor tells you to stop. °Call your doctor or health care professional for advice if you get a fever, chills or sore throat, or other symptoms of a cold or flu. Do not treat yourself. This drug decreases your body's ability to fight infections. Try to avoid being around people who are sick. °This medicine may increase your risk to bruise or bleed. Call your doctor or health care professional if you notice any unusual bleeding. °Be careful brushing and flossing your teeth or using a toothpick because you may get an infection or bleed more easily. If you have any dental work done, tell your dentist you are receiving this medicine. °Avoid taking products that contain aspirin, acetaminophen, ibuprofen, naproxen, or ketoprofen unless instructed by your   doctor. These medicines may hide a fever. °Do not become pregnant while taking this medicine. Women should inform their doctor if they wish to become pregnant or think they might be pregnant. There is a potential for serious side effects to an unborn child. Talk to your health care professional or pharmacist for more information. Do not breast-feed an infant while taking this medicine. °Men are advised not to father a child while receiving this  medicine. °This product may contain alcohol. Ask your pharmacist or healthcare provider if this medicine contains alcohol. Be sure to tell all healthcare providers you are taking this medicine. Certain medicines, like metronidazole and disulfiram, can cause an unpleasant reaction when taken with alcohol. The reaction includes flushing, headache, nausea, vomiting, sweating, and increased thirst. The reaction can last from 30 minutes to several hours. °What side effects may I notice from receiving this medication? °Side effects that you should report to your doctor or health care professional as soon as possible: °allergic reactions like skin rash, itching or hives, swelling of the face, lips, or tongue °breathing problems °changes in vision °fast, irregular heartbeat °high or low blood pressure °mouth sores °pain, tingling, numbness in the hands or feet °signs of decreased platelets or bleeding - bruising, pinpoint red spots on the skin, black, tarry stools, blood in the urine °signs of decreased red blood cells - unusually weak or tired, feeling faint or lightheaded, falls °signs of infection - fever or chills, cough, sore throat, pain or difficulty passing urine °signs and symptoms of liver injury like dark yellow or brown urine; general ill feeling or flu-like symptoms; light-colored stools; loss of appetite; nausea; right upper belly pain; unusually weak or tired; yellowing of the eyes or skin °swelling of the ankles, feet, hands °unusually slow heartbeat °Side effects that usually do not require medical attention (report to your doctor or health care professional if they continue or are bothersome): °diarrhea °hair loss °loss of appetite °muscle or joint pain °nausea, vomiting °pain, redness, or irritation at site where injected °tiredness °This list may not describe all possible side effects. Call your doctor for medical advice about side effects. You may report side effects to FDA at 1-800-FDA-1088. °Where  should I keep my medication? °This drug is given in a hospital or clinic and will not be stored at home. °NOTE: This sheet is a summary. It may not cover all possible information. If you have questions about this medicine, talk to your doctor, pharmacist, or health care provider. °© 2022 [employer]/Gold Standard (2020-11-10 00:00:00) ° °Carboplatin injection °What is this medication? °CARBOPLATIN (KAR boe pla tin) is a chemotherapy drug. It targets fast dividing cells, like cancer cells, and causes these cells to die. This medicine is used to treat ovarian cancer and many other cancers. °This medicine may be used for other purposes; ask your health care provider or pharmacist if you have questions. °COMMON BRAND NAME(S): Paraplatin °What should I tell my care team before I take this medication? °They need to know if you have any of these conditions: °blood disorders °hearing problems °kidney disease °recent or ongoing radiation therapy °an unusual or allergic reaction to carboplatin, cisplatin, other chemotherapy, other medicines, foods, dyes, or preservatives °pregnant or trying to get pregnant °breast-feeding °How should I use this medication? °This drug is usually given as an infusion into a vein. It is administered in a hospital or clinic by a specially trained health care professional. °Talk to your pediatrician regarding the use of this medicine in children.   Special care may be needed. °Overdosage: If you think you have taken too much of this medicine contact a poison control center or emergency room at once. °NOTE: This medicine is only for you. Do not share this medicine with others. °What if I miss a dose? °It is important not to miss a dose. Call your doctor or health care professional if you are unable to keep an appointment. °What may interact with this medication? °medicines for seizures °medicines to increase blood counts like filgrastim, pegfilgrastim, sargramostim °some antibiotics like amikacin,  gentamicin, neomycin, streptomycin, tobramycin °vaccines °Talk to your doctor or health care professional before taking any of these medicines: °acetaminophen °aspirin °ibuprofen °ketoprofen °naproxen °This list may not describe all possible interactions. Give your health care provider a list of all the medicines, herbs, non-prescription drugs, or dietary supplements you use. Also tell them if you smoke, drink alcohol, or use illegal drugs. Some items may interact with your medicine. °What should I watch for while using this medication? °Your condition will be monitored carefully while you are receiving this medicine. You will need important blood work done while you are taking this medicine. °This drug may make you feel generally unwell. This is not uncommon, as chemotherapy can affect healthy cells as well as cancer cells. Report any side effects. Continue your course of treatment even though you feel ill unless your doctor tells you to stop. °In some cases, you may be given additional medicines to help with side effects. Follow all directions for their use. °Call your doctor or health care professional for advice if you get a fever, chills or sore throat, or other symptoms of a cold or flu. Do not treat yourself. This drug decreases your body's ability to fight infections. Try to avoid being around people who are sick. °This medicine may increase your risk to bruise or bleed. Call your doctor or health care professional if you notice any unusual bleeding. °Be careful brushing and flossing your teeth or using a toothpick because you may get an infection or bleed more easily. If you have any dental work done, tell your dentist you are receiving this medicine. °Avoid taking products that contain aspirin, acetaminophen, ibuprofen, naproxen, or ketoprofen unless instructed by your doctor. These medicines may hide a fever. °Do not become pregnant while taking this medicine. Women should inform their doctor if they wish  to become pregnant or think they might be pregnant. There is a potential for serious side effects to an unborn child. Talk to your health care professional or pharmacist for more information. Do not breast-feed an infant while taking this medicine. °What side effects may I notice from receiving this medication? °Side effects that you should report to your doctor or health care professional as soon as possible: °allergic reactions like skin rash, itching or hives, swelling of the face, lips, or tongue °signs of infection - fever or chills, cough, sore throat, pain or difficulty passing urine °signs of decreased platelets or bleeding - bruising, pinpoint red spots on the skin, black, tarry stools, nosebleeds °signs of decreased red blood cells - unusually weak or tired, fainting spells, lightheadedness °breathing problems °changes in hearing °changes in vision °chest pain °high blood pressure °low blood counts - This drug may decrease the number of white blood cells, red blood cells and platelets. You may be at increased risk for infections and bleeding. °nausea and vomiting °pain, swelling, redness or irritation at the injection site °pain, tingling, numbness in the hands or feet °problems   with balance, talking, walking °trouble passing urine or change in the amount of urine °Side effects that usually do not require medical attention (report to your doctor or health care professional if they continue or are bothersome): °hair loss °loss of appetite °metallic taste in the mouth or changes in taste °This list may not describe all possible side effects. Call your doctor for medical advice about side effects. You may report side effects to FDA at 1-800-FDA-1088. °Where should I keep my medication? °This drug is given in a hospital or clinic and will not be stored at home. °NOTE: This sheet is a summary. It may not cover all possible information. If you have questions about this medicine, talk to your doctor, pharmacist,  or health care provider. °© 2022 [employer]/Gold Standard (2007-08-01 00:00:00) ° °

## 2021-01-18 NOTE — Progress Notes (Signed)
Mills, Mitton Legal Sex  Male DOB  November 21, 1940 SSN  [id number] Address  [address] [place] [address] Phone  [phone] (Home) *Preferred*    RE: [place] CORE BIOPSY (SOFT TISSUE) Received: 5 days ago Suttle, Rosanne Ashing, MD  Valli Glance Approved for ultrasound guided right neck cyst aspiration.   Dylan        Previous Messages   ----- Message -----  From: Valli Glance  Sent: 01/12/2021   6:02 PM EST  To: [facility] Procedure Requests  Subject: [place] CORE BIOPSY (SOFT TISSUE)                   [place] CORE BIOPSY (SOFT TISSUE)      Reason: Malignant neoplasm of lower third of esophagus, Lymphadenopathy of head and neck region. Right neck complex cystic structure/mass, recommendation from 01/08/2021 ultrasound.      History: [place] and CT and PET in Computer      Provider:Feng, Krista Blue

## 2021-01-18 NOTE — Progress Notes (Signed)
[place]   [phone] [phone]   Clinic Follow up Note   Patient Care Team: [name], MD as PCP - General (Family Medicine) Jerline Pain, MD as PCP - Cardiology (Cardiology) Delia Chimes, NP (Inactive) as Nurse Practitioner (Nurse Practitioner) Ladene Artist, MD as Consulting Physician (Gastroenterology) Alla Feeling, NP as Nurse Practitioner (Nurse Practitioner) Truitt Merle, MD as Consulting Physician (Hematology) Truitt Merle, MD as Consulting Physician (Oncology)  Date of Service:  01/18/2021  CHIEF COMPLAINT: f/u of esophageal cancer  CURRENT THERAPY:  Concurrent chemoRT with weekly carbo/taxol, starting 01/18/21  ASSESSMENT & PLAN:  Jeffrey [name] is a 80 y.o. male with   1. Adenocarcinoma of the GE junction, cTxN1M0, HER2(-), MMR normal, PD-L1 CPS 3% -He presented with 6-8 months of dysphagia and 10 pounds weight loss, which progressed. He also palpated a right cervical lymph node. Initial CT neck on 10/22/20 showed 2.8 cm lymph node, concerning for metastatic disease. -CT CA 12/08/20 showed suspicious mass at GE junction and prominent perigastric lymph nodes. EGD on 12/17/20 with Dr. Fuller Plan showing 5 cm mass in distal esophagus, with biopsy confirming adenocarcinoma with signet ring features, Her2 negative. -PET scan on 12/30/20 showed FDG-avid mass at GE junction and mild uptake with borderline gastrohepatic ligament lymph nodes. No distant metastatic disease. -he is beginning concurrent chemoRT with weekly carbo/taxol today, 01/18/21. We will see him weekly with labs. -Lab reviewed, adequate for treatment, due to his advanced age and medical comorbidities, I will slightly reduce The dose for week 1 today.   2. Dysphagia and weight loss -Secondary to #1 -Onset 6-8 months ago with 10-15 pounds weight loss  -solid dysphagia is moderate, he can still tolerate soft food and some solids.  Does pretty well with liquids and  medications -Recommend to start nutrition supplement -No urgent need for parenteral nutrition at this time -he met our dietician 10/28.   3. Goals of care -He understands if work-up confirms metastatic disease, it will not be curable but still treatable, treatment will be palliative   4. Comorbidities: CVA with residual right-sided weakness, history of MI, CAD, HTN, DM, HL -Patient lives alone, his daughter checks on him frequently to help with housework and food prep -He is limited in his ADLs due to residual right-sided weakness from CVA -He ambulates with support, can bathe and dress with difficulty   5. Right neck lesion -not hypermetabolic on PET scan -[place] 27/0/62 showed a minimally complex cystic lesion -aspiration/biopsy is recommended. This has not yet been scheduled. They note it's because he is on plavix and were concerned about coming off it.     PLAN: -proceed with week 1 carbo/taxol today with carbo dose reduction to see how he tolerates. If he does well, will change to full dose next week  -continue daily radiation -lab and f/u weekly, with me or NP Lacie -IR right neck mass biopsy,Karen will arrange it    No problem-specific Assessment & Plan notes found for this encounter.   SUMMARY OF ONCOLOGIC HISTORY: Oncology History Overview Note  Cancer Staging Esophageal cancer [facility]) Staging form: Esophagus - Adenocarcinoma, AJCC 8th Edition - Clinical stage from 12/31/2020: Stage IIA (cT1, cN1, cM0, GX) - Unsigned    Esophageal cancer ([place])  10/22/2020 Imaging   CT neck IMPRESSION: 2.8 cm right level 2A lymph node, which appears to be centrally necrotic, concerning for metastatic disease. No additional lymphadenopathy or other acute process in the neck.     11/30/2020  Imaging   Barium swallow FINDINGS: Limitations on positioning due to sequelae of stroke. The patient swallowed barium without difficulty. No aspiration. There is no mass or stricture. Normal  motility. There is no hiatal hernia. No spontaneous gastroesophageal reflux. Barium pill was swallowed and passed without difficulty.   IMPRESSION: No significant abnormality.   12/08/2020 Imaging   CT chest abdomen with contrast IMPRESSION: 1. Mass is suspected just at the level of the GE junction. Suspicious for primary esophageal neoplasm. Further evaluation with endoscopy is advised. 2. Prominent perigastric lymph nodes are identified, suspicious for metastatic adenopathy. 3. Gallstone. 4. Coronary artery calcifications. 5. Aortic Atherosclerosis (ICD10-I70.0) and Emphysema (ICD10-J43.9). 6. These results will be called to the ordering clinician or representative by the Radiologist Assistant, and communication documented in the PACS or [employer].     12/17/2020 Procedure   EGD by Dr. Fuller Plan - A medium-sized, fungating mass with contact bleeding was found in the distal esophagus, 36 cm from the incisors extending to 41 cm in the cardia. The mass was partially obstructing and circumferential.   12/17/2020 Initial Biopsy   Diagnosis Esophagus, biopsy, distal and gastric cardia mass - ADENOCARCINOMA WITH SIGNET RING FEATURES.   12/19/2020 Initial Diagnosis   Esophageal cancer ([place])   12/30/2020 PET scan   IMPRESSION: 1. Mass at the level of the GE junction and proximal stomach is FDG avid within SUV max of 7.62. Findings compatible with primary gastric neoplasm. 2. There is mild FDG uptake associated with borderline gastrohepatic ligament lymph nodes which have an SUV max of 2.97. Equivocal for nodal metastasis. 3. No signs of distant metastatic disease. 4. Aortic Atherosclerosis (ICD10-I70.0) and Emphysema (ICD10-J43.9). Coronary artery calcifications. 5. Gallstones. 6. Prostate gland enlargement.   01/08/2021 Imaging   EXAM: ULTRASOUND OF HEAD/NECK SOFT TISSUES  IMPRESSION: Sonographic evaluation of the right-side of the neck confirms a minimally complex cystic  lesion with broad differential considerations including cystic/necrotic cervical lymphadenopathy (favored given presence of an additional smaller apparently partially cystic right cervical lymph node), compatible with the findings on preceding PET-CT.   This minimally complex cystic structure/mass could undergo ultrasound-guided biopsy/aspiration as indicated.   01/18/2021 -  Chemotherapy   Patient is on Treatment Plan : ESOPHAGUS Carboplatin/PACLitaxel weekly x 6 weeks with XRT          INTERVAL HISTORY:  Jeffrey [name] is here for a follow up of esophageal cancer. He was last seen by me on 12/31/20. He presents to the clinic accompanied by his daughter. He reports he is stable overall. He reports his swallowing is about the same.   All other systems were reviewed with the patient and are negative.  MEDICAL HISTORY:  Past Medical History:  Diagnosis Date   Anterior cerebral circulation hemorrhagic infarction [facility]) 11/24/2014   March, 2016, dominant left thalamic and left internal capsule ischemic infarct with resultant hemorrhagic transformation, secondary to small vessel disease. Resulted in right hemiparesis dysarthria and diplopia   //   readmission with aphasia July, 2016, this improved    Arthritis    CAD (coronary artery disease)    PCI distal RCA ...2004, residual 70% LAD   /   ...nuclear...03/2007...no ischemia.Marland KitchenMarland Kitchenpreserved LV /  nuclear...03/03/2009...inferior scar..no ischemia..EF 51%   Cyst of nasopharynx    per ENT [facility] Cornelia   Dyslipidemia    takes Atorvastatin daily   GERD (gastroesophageal reflux disease)    takes Omeprazole daily   Gout    takes Allopurinol daily and Colchicine as needed   HCAP (healthcare-associated  pneumonia)    Hemiparesis affecting right side as late effect of cerebrovascular accident ([place]) 09/05/2014   History of colon polyps    HTN (hypertension)    takes Cardura,Metoprolol,[name],and Amlodipine daily   Internal hemorrhoids    Myocardial  infarction [facility]) 31yr ago   Peripheral edema    takes Lasix daily   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    SURGICAL HISTORY: Past Surgical History:  Procedure Laterality Date   BALLOON ANGIOPLASTY, ARTERY  1992, 2004   CAD, Dr. KRon Parker  CARDIAC CATHETERIZATION  2004   CATARACT EXTRACTION Bilateral 2010   COLONOSCOPY  04/03/2002   adenomatous polyp, int hemorrhoids   COLONOSCOPY  07/18/2007   normal (Dr. SFuller Plan   COLONOSCOPY  09/26/2012   tubular adenoma, sm int hem, rpt 5 yrs (Fuller Plan   ELBOW SURGERY Right 1997   eye lids raised     NASAL SEPTUM SURGERY     POLYPECTOMY N/A 05/27/2013   Procedure: ENDOSCOPIC NASOPHARYNGEAL MASS;  Surgeon: DJerrell Belfast MD   ROTATOR CUFF REPAIR  09/2011   left, with subacromial decompression   TONSILLECTOMY AND ADENOIDECTOMY      I have reviewed the social history and family history with the patient and they are unchanged from previous note.  ALLERGIES:  is allergic to niacin.  MEDICATIONS:  Current Outpatient Medications  Medication Sig Dispense Refill   ACIDOPHILUS LACTOBACILLUS PO Take 2 tablets by mouth daily.     atorvastatin (LIPITOR) 80 MG tablet Take 80 mg by mouth at bedtime.     baclofen (LIORESAL) 10 MG tablet TAKE ONE TABLET BY MOUTH 3 TIMES A DAY AS NEEDED FOR SPASTICITY.     Cholecalciferol 25 MCG (1000 UT) tablet Take by mouth.     clopidogrel (PLAVIX) 75 MG tablet Take 1 tablet (75 mg total) by mouth daily.     empagliflozin (JARDIANCE) 25 MG TABS tablet Take 1 tablet by mouth daily.     fosinopril ([name]) 40 MG tablet Take 1 tablet (40 mg total) by mouth daily.     furosemide (LASIX) 20 MG tablet Take 1 tablet (20 mg total) by mouth daily AND 2 tablets (40 mg total) 3 (three) times a week.  Every day 221m some days 4034m    insulin aspart (NOVOLOG FLEXPEN) 100 UNIT/ML FlexPen Inject 10 Units into the skin in the morning and at bedtime. With meals     insulin glargine (LANTUS) 100 UNIT/ML Solostar Pen Inject 24 Units into the skin at bedtime.     LACTOBACILLUS PO Take 1 capsule by mouth daily. [facility] colon health.  Takes M/W/F     loratadine (CLARITIN) 10 MG tablet 1 tablet     metoprolol tartrate (LOPRESSOR) 25 MG tablet Take 1 tablet (25 mg total) by mouth 2 (two) times daily.     mupirocin cream (BACTROBAN) 2 % Apply 1 application topically 2 (two) times daily. 15 g 0   mupirocin ointment (BACTROBAN) 2 %      ondansetron (ZOFRAN) 8 MG tablet Take 1 tablet (8 mg total) by mouth 2 (two) times daily as needed for refractory nausea / vomiting. Start on day 3 after chemo. 30 tablet 1   pantoprazole (PROTONIX) 20 MG tablet TAKE 1 TABLET  BY MOUTH EVERY DAY 30 tablet 3   Potassium 99 MG TABS Take 1 tablet (99 mg total) by mouth daily as needed (when you take 2 lasix pills).     prochlorperazine (COMPAZINE) 10 MG tablet Take 1 tablet (10 mg total) by mouth every 6 (six) hours as needed (Nausea or vomiting). 30 tablet 1   sertraline (ZOLOFT) 25 MG tablet Take 1 tablet (25 mg total) by mouth daily.     No current facility-administered medications for this visit.    PHYSICAL EXAMINATION: ECOG PERFORMANCE STATUS: 2 - Symptomatic, <50% confined to bed  Vitals:   01/18/21 1252  BP: (!) 146/88  Pulse: 60  Resp: 18  Temp: 98 F (36.7 C)  SpO2: 100%   Wt Readings from Last 3 Encounters:  01/18/21 159 lb 12.8 oz (72.5 kg)  12/31/20 161 lb 9.6 oz (73.3 kg)  12/24/20 161 lb 3.2 oz (73.1 kg)     GENERAL:alert, no distress and comfortable SKIN: skin color normal, no rashes or significant lesions EYES: normal, Conjunctiva are pink and non-injected, sclera clear  NEURO: alert & oriented x 3 with fluent speech  LABORATORY DATA:  I have reviewed the data as listed CBC Latest Ref Rng  & Units 01/18/2021 12/24/2020 10/14/2020  WBC 4.0 - 10.5 K/uL 7.2 6.9 6.7  Hemoglobin 13.0 - 17.0 g/dL 15.6 15.6 14.6  Hematocrit 39.0 - 52.0 % 47.5 46.6 44.5  Platelets 150 - 400 K/uL 167 163 162.0     CMP Latest Ref Rng & Units 01/18/2021 12/24/2020 11/30/2020  Glucose 70 - 99 mg/dL 171(H) 85 71  BUN 8 - 23 mg/dL _0 Creatinine 0.61 - 1.24 mg/dL 1.07 1.07 0.99  Sodium 135 - 145 mmol/L 143 140 142  Potassium 3.5 - 5.1 mmol/L 4.0 3.7 3.6  Chloride 98 - 111 mmol/L 105 104 103  CO2 22 - 32 mmol/L 30 26 32  Calcium 8.9 - 10.3 mg/dL 9.6 9.6 9.5  Total Protein 6.5 - 8.1 g/dL 7.4 7.5 -  Total Bilirubin 0.3 - 1.2 mg/dL 0.8 1.2 -  Alkaline Phos 38 - 126 U/L 127(H) 107 -  AST 15 - 41 U/L 21 23 -  ALT 0 - 44 U/L 27 25 -      RADIOGRAPHIC STUDIES: I have personally reviewed the radiological images as listed and agreed with the findings in the report. No results found.    No orders of the defined types were placed in this encounter.  All questions were answered. The patient knows to call the clinic with any problems, questions or concerns. No barriers to learning was detected. The total time spent in the appointment was 30 minutes.     Truitt Merle, MD 01/18/2021   I, Wilburn Mylar, am acting as scribe for Truitt Merle, MD.   I have reviewed the above documentation for accuracy and completeness, and I agree with the above.

## 2021-01-18 NOTE — Progress Notes (Signed)
Nutrition Follow-up:  Patient with esophageal cancer and followed by Dr Burr Medico.  Patient receiving concurrent chemotherapy and radiation.  Starting chemo today.   Met with patient during infusion.  Spoke with daughter over the phone.  Patient says that he is trying the best he can to eat.  Says that he eats 3 meals a day and snack in between.  Trying to drink 3 ensure plus per day but usually only gets in 2.  Daughter has ordered ensure complete. Likes the chocolate.  Daughter helps prepare meals for patient.  Daughter has been adding ensure to foods to increase calories and protein.      Medications: reviewed  Labs: reviewed  Anthropometrics:   Weight 159 lb 12.8 oz today  161 lb 2 oz on 10/20  UBW of 171 lb on March 2022   NUTRITION DIAGNOSIS: Unintentional weight loss continues    INTERVENTION:  Encouraged daughter to continue adding calories and protein to foods (ie ensure, whole milk, butter, gravy)   Patient to continue high calorie shakes 2-3 times per day     MONITORING, EVALUATION, GOAL: weight trends, intake   NEXT VISIT: Monday, Nov 21 during infusion (speak with dtr as well)  Jeffrey [name] B. Zenia Resides, [place], [place] Registered Dietitian [phone] (mobile)

## 2021-01-19 ENCOUNTER — Telehealth: Payer: Self-pay | Admitting: *Deleted

## 2021-01-19 ENCOUNTER — Ambulatory Visit
Admission: RE | Admit: 2021-01-19 | Discharge: 2021-01-19 | Disposition: A | Discharge status: Home / Self Care | Payer: PPO | Source: Ambulatory Visit | Attending: Radiation Oncology | Admitting: Radiation Oncology

## 2021-01-19 DIAGNOSIS — C160 Malignant neoplasm of cardia: Secondary | ICD-10-CM | POA: Diagnosis not present

## 2021-01-19 DIAGNOSIS — Z20822 Contact with and (suspected) exposure to covid-19: Secondary | ICD-10-CM | POA: Diagnosis not present

## 2021-01-19 DIAGNOSIS — Z510 Encounter for antineoplastic radiation therapy: Secondary | ICD-10-CM | POA: Diagnosis not present

## 2021-01-19 NOTE — Telephone Encounter (Signed)
Called & spoke with pt's daughter, Jeffrey [name] who states that her dad is doing well.  He denies N/V or bowel problems & is eating & drinking well.  His wife has Covid but he has tested negative with a home test.  He is limiting his contact with her & wearing a mask.  He knows how to reach [place] if needed.

## 2021-01-19 NOTE — Telephone Encounter (Signed)
Daughter called stating pt wife tested positive for COVID. Advised of our 10 day protocol after positive test is received. Daughter verbalized understanding. Advised to call once test results come back

## 2021-01-20 ENCOUNTER — Ambulatory Visit (HOSPITAL_BASED_OUTPATIENT_CLINIC_OR_DEPARTMENT_OTHER): Payer: PPO | Admitting: Family

## 2021-01-20 ENCOUNTER — Other Ambulatory Visit: Payer: Self-pay

## 2021-01-20 ENCOUNTER — Ambulatory Visit
Admission: RE | Admit: 2021-01-20 | Discharge: 2021-01-20 | Disposition: A | Discharge status: Home / Self Care | Payer: PPO | Source: Ambulatory Visit | Attending: Radiation Oncology | Admitting: Radiation Oncology

## 2021-01-20 DIAGNOSIS — C160 Malignant neoplasm of cardia: Secondary | ICD-10-CM | POA: Diagnosis not present

## 2021-01-20 DIAGNOSIS — Z510 Encounter for antineoplastic radiation therapy: Secondary | ICD-10-CM | POA: Diagnosis not present

## 2021-01-21 ENCOUNTER — Ambulatory Visit
Admission: RE | Admit: 2021-01-21 | Discharge: 2021-01-21 | Disposition: A | Discharge status: Home / Self Care | Payer: PPO | Source: Ambulatory Visit | Attending: Radiation Oncology | Admitting: Radiation Oncology

## 2021-01-21 DIAGNOSIS — Z510 Encounter for antineoplastic radiation therapy: Secondary | ICD-10-CM | POA: Diagnosis not present

## 2021-01-21 DIAGNOSIS — C160 Malignant neoplasm of cardia: Secondary | ICD-10-CM | POA: Diagnosis not present

## 2021-01-21 NOTE — Progress Notes (Signed)
Pt here for patient teaching.  Pt given Radiation and You booklet, skin care instructions, and Sonafine.  Reviewed areas of pertinence such as fatigue, hair loss, skin changes, throat changes, cough, and shortness of breath . Pt able to give teach back of to pat skin and use unscented/gentle soap,apply Sonafine bid and avoid applying anything to skin within 4 hours of treatment. Pt verbalizes understanding of information given and will contact nursing with any questions or concerns.  Daughter present for teaching.   [email or web address]/[email or web address]  Gloriajean Dell. Leonie Green, BSN

## 2021-01-22 ENCOUNTER — Other Ambulatory Visit: Payer: Self-pay

## 2021-01-22 ENCOUNTER — Ambulatory Visit
Admission: RE | Admit: 2021-01-22 | Discharge: 2021-01-22 | Disposition: A | Discharge status: Home / Self Care | Payer: PPO | Source: Ambulatory Visit | Attending: Radiation Oncology | Admitting: Radiation Oncology

## 2021-01-22 DIAGNOSIS — C158 Malignant neoplasm of overlapping sites of esophagus: Secondary | ICD-10-CM

## 2021-01-22 DIAGNOSIS — Z510 Encounter for antineoplastic radiation therapy: Secondary | ICD-10-CM | POA: Diagnosis not present

## 2021-01-22 DIAGNOSIS — C160 Malignant neoplasm of cardia: Secondary | ICD-10-CM | POA: Diagnosis not present

## 2021-01-22 MED ORDER — SONAFINE EX EMUL
1.0000 "application " | Freq: Once | CUTANEOUS | Status: AC
  Administered 2021-01-22: 1 via TOPICAL

## 2021-01-22 MED FILL — Dexamethasone Sodium Phosphate Inj 100 MG/10ML: INTRAMUSCULAR | Qty: 1 | Status: AC

## 2021-01-24 NOTE — Progress Notes (Signed)
[name]   [phone] [phone]   Clinic Follow up Note   Patient Care Team: [name], MD as PCP - General (Family Medicine) Jerline Pain, MD as PCP - Cardiology (Cardiology) Delia Chimes, NP (Inactive) as Nurse Practitioner (Nurse Practitioner) Ladene Artist, MD as Consulting Physician (Gastroenterology) Alla Feeling, NP as Nurse Practitioner (Nurse Practitioner) Truitt Merle, MD as Consulting Physician (Hematology) Truitt Merle, MD as Consulting Physician (Oncology) 01/25/2021  CHIEF COMPLAINT: Follow up esophageal cancer   SUMMARY OF ONCOLOGIC HISTORY: Oncology History Overview Note  Cancer Staging Esophageal cancer Jeffrey [name]) Staging form: Esophagus - Adenocarcinoma, AJCC 8th Edition - Clinical stage from 12/31/2020: Stage IIA (cT1, cN1, cM0, GX) - Unsigned    Esophageal cancer ([facility])  10/22/2020 Imaging   CT neck IMPRESSION: 2.8 cm right level 2A lymph node, which appears to be centrally necrotic, concerning for metastatic disease. No additional lymphadenopathy or other acute process in the neck.     11/30/2020 Imaging   Barium swallow FINDINGS: Limitations on positioning due to sequelae of stroke. The patient swallowed barium without difficulty. No aspiration. There is no mass or stricture. Normal motility. There is no hiatal hernia. No spontaneous gastroesophageal reflux. Barium pill was swallowed and passed without difficulty.   IMPRESSION: No significant abnormality.   12/08/2020 Imaging   CT chest abdomen with contrast IMPRESSION: 1. Mass is suspected just at the level of the GE junction. Suspicious for primary esophageal neoplasm. Further evaluation with endoscopy is advised. 2. Prominent perigastric lymph nodes are identified, suspicious for metastatic adenopathy. 3. Gallstone. 4. Coronary artery calcifications. 5. Aortic Atherosclerosis (ICD10-I70.0) and Emphysema (ICD10-J43.9). 6. These results will be called  to the ordering clinician or representative by the Radiologist Assistant, and communication documented in the PACS or [employer].     12/17/2020 Procedure   EGD by Dr. Fuller Plan - A medium-sized, fungating mass with contact bleeding was found in the distal esophagus, 36 cm from the incisors extending to 41 cm in the cardia. The mass was partially obstructing and circumferential.   12/17/2020 Initial Biopsy   Diagnosis Esophagus, biopsy, distal and gastric cardia mass - ADENOCARCINOMA WITH SIGNET RING FEATURES.   12/19/2020 Initial Diagnosis   Esophageal cancer (Jeffrey [name])   12/30/2020 PET scan   IMPRESSION: 1. Mass at the level of the GE junction and proximal stomach is FDG avid within SUV max of 7.62. Findings compatible with primary gastric neoplasm. 2. There is mild FDG uptake associated with borderline gastrohepatic ligament lymph nodes which have an SUV max of 2.97. Equivocal for nodal metastasis. 3. No signs of distant metastatic disease. 4. Aortic Atherosclerosis (ICD10-I70.0) and Emphysema (ICD10-J43.9). Coronary artery calcifications. 5. Gallstones. 6. Prostate gland enlargement.   01/08/2021 Imaging   EXAM: ULTRASOUND OF HEAD/NECK SOFT TISSUES  IMPRESSION: Sonographic evaluation of the right-side of the neck confirms a minimally complex cystic lesion with broad differential considerations including cystic/necrotic cervical lymphadenopathy (favored given presence of an additional smaller apparently partially cystic right cervical lymph node), compatible with the findings on preceding PET-CT.   This minimally complex cystic structure/mass could undergo ultrasound-guided biopsy/aspiration as indicated.   01/18/2021 -  Chemotherapy   Patient is on Treatment Plan : ESOPHAGUS Carboplatin/PACLitaxel weekly x 6 weeks with XRT         CURRENT THERAPY: Concurrent chemoRT with weekly carbo/taxol, starting 01/18/21  INTERVAL HISTORY: [name] returns for follow up and  treatment as scheduled. Last seen 01/18/21 for week/cycle 1 chemoRT. He is doing mostly well,  but already  noticed decreased appetite, taste change/metallic, and fatigue. He had mild nausea without vomiting that resolved with compazine. He has constipation, had good BM a couple days ago after eating salad. He is doing well with food/liquid, normal diet except dry meat. Denies odynophagia. Denies fever, chills, cough, chest pain, dyspnea, leg edema, rash, pain, or new/worsening neuropathy. His wife lives at a facility, she has covid, she is doing fine.    MEDICAL HISTORY:  Past Medical History:  Diagnosis Date   Anterior cerebral circulation hemorrhagic infarction Jeffrey [name]) 11/24/2014   March, 2016, dominant left thalamic and left internal capsule ischemic infarct with resultant hemorrhagic transformation, secondary to small vessel disease. Resulted in right hemiparesis dysarthria and diplopia   //   readmission with aphasia July, 2016, this improved    Arthritis    CAD (coronary artery disease)    PCI distal RCA ...2004, residual 70% LAD   /   ...nuclear...03/2007...no ischemia.Marland KitchenMarland Kitchenpreserved LV /  nuclear...03/03/2009...inferior scar..no ischemia..EF 51%   Cyst of nasopharynx    per ENT Wilburn Cornelia   Dyslipidemia    takes Atorvastatin daily   GERD (gastroesophageal reflux disease)    takes Omeprazole daily   Gout    takes Allopurinol daily and Colchicine as needed   HCAP (healthcare-associated pneumonia)    Hemiparesis affecting right side as late effect of cerebrovascular accident (Jeffrey [name]) 09/05/2014   History of colon polyps    HTN (hypertension)    takes Cardura,Metoprolol,[name],and Amlodipine daily   Internal hemorrhoids    Myocardial infarction Jeffrey [name]) 33yr ago   Peripheral edema    takes Lasix daily   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    SURGICAL HISTORY: Past Surgical History:  Procedure Laterality Date   BALLOON ANGIOPLASTY, ARTERY  1992, 2004   CAD, Dr. KRon Parker  CARDIAC CATHETERIZATION  2004   CATARACT EXTRACTION Bilateral 2010   COLONOSCOPY  04/03/2002   adenomatous polyp, int hemorrhoids   COLONOSCOPY  07/18/2007   normal (Dr. SFuller Plan   COLONOSCOPY  09/26/2012   tubular adenoma, sm int hem, rpt 5 yrs (Fuller Plan   ELBOW SURGERY Right 1997   eye lids raised     NASAL SEPTUM SURGERY     POLYPECTOMY N/A 05/27/2013   Procedure: ENDOSCOPIC NASOPHARYNGEAL MASS;  Surgeon: DJerrell Belfast MD   ROTATOR CUFF REPAIR  09/2011   left, with subacromial decompression   TONSILLECTOMY AND ADENOIDECTOMY      I have reviewed the social history and family history with the patient and they are unchanged from previous note.  ALLERGIES:  is allergic to niacin.  MEDICATIONS:  Current Outpatient Medications  Medication Sig Dispense Refill   ACIDOPHILUS LACTOBACILLUS PO Take 2 tablets by mouth daily.     atorvastatin (LIPITOR) 80 MG tablet Take 80 mg by mouth at bedtime.     baclofen (LIORESAL) 10 MG tablet TAKE ONE TABLET BY MOUTH 3 TIMES A DAY AS NEEDED FOR SPASTICITY.     Cholecalciferol 25 MCG (1000 UT) tablet Take by mouth.     clopidogrel (PLAVIX) 75 MG tablet Take 1 tablet (75 mg total) by mouth daily.     empagliflozin (JARDIANCE) 25 MG TABS tablet Take 1 tablet by mouth daily.     fosinopril (  [name]) 40 MG tablet Take 1 tablet (40 mg total) by mouth daily.     furosemide (LASIX) 20 MG tablet Take 1 tablet (20 mg total) by mouth daily AND 2 tablets (40 mg total) 3 (three) times a week. Every day 30m, some days 438m     insulin aspart (NOVOLOG FLEXPEN) 100 UNIT/ML FlexPen Inject 10 Units into the skin in the morning and at bedtime. With meals     insulin glargine (LANTUS) 100 UNIT/ML Solostar Pen Inject 24 Units into the skin at bedtime.      LACTOBACILLUS PO Take 1 capsule by mouth daily. [facility] colon [facility].  Takes M/W/F     loratadine (CLARITIN) 10 MG tablet 1 tablet     metoprolol tartrate (LOPRESSOR) 25 MG tablet Take 1 tablet (25 mg total) by mouth 2 (two) times daily.     mupirocin cream (BACTROBAN) 2 % Apply 1 application topically 2 (two) times daily. 15 g 0   mupirocin ointment (BACTROBAN) 2 %      ondansetron (ZOFRAN) 8 MG tablet Take 1 tablet (8 mg total) by mouth 2 (two) times daily as needed for refractory nausea / vomiting. Start on day 3 after chemo. 30 tablet 1   pantoprazole (PROTONIX) 20 MG tablet TAKE 1 TABLET BY MOUTH EVERY DAY 30 tablet 3   Potassium 99 MG TABS Take 1 tablet (99 mg total) by mouth daily as needed (when you take 2 lasix pills).     prochlorperazine (COMPAZINE) 10 MG tablet Take 1 tablet (10 mg total) by mouth every 6 (six) hours as needed (Nausea or vomiting). 30 tablet 1   sertraline (ZOLOFT) 25 MG tablet Take 1 tablet (25 mg total) by mouth daily.     No current facility-administered medications for this visit.    PHYSICAL EXAMINATION: ECOG PERFORMANCE STATUS: 1 Vitals:   01/25/21 1114  BP: (!) 157/73  Pulse: 64  Resp: 18  Temp: 98 F (36.7 C)  SpO2: 100%   Filed Weights   01/25/21 1114  Weight: 159 lb 2 oz (72.2 kg)    GENERAL:alert, no distress and comfortable SKIN: no rash or significant erythema  EYES:  sclera clear LUNGS:  normal breathing effort HEART: no lower extremity edema NEURO: alert & oriented x 3 with fluent speech  LABORATORY DATA:  I have reviewed the data as listed CBC Latest Ref Rng & Units 01/25/2021 01/18/2021 12/24/2020  WBC 4.0 - 10.5 K/uL 5.0 7.2 6.9  Hemoglobin 13.0 - 17.0 g/dL 13.5 15.6 15.6  Hematocrit 39.0 - 52.0 % 40.0 47.5 46.6  Platelets 150 - 400 K/uL 136(L) 167 163     CMP Latest Ref Rng & Units 01/25/2021 01/18/2021 12/24/2020  Glucose 70 - 99 mg/dL 91 171(H) 85  BUN 8 - 23 mg/dL _0 Creatinine 0.61 - 1.24 mg/dL 0.77 1.07 1.07   Sodium 135 - 145 mmol/L 141 143 140  Potassium 3.5 - 5.1 mmol/L 3.9 4.0 3.7  Chloride 98 - 111 mmol/L 110 105 104  CO2 22 - 32 mmol/L _1 Calcium 8.9 - 10.3 mg/dL 8.8(L) 9.6 9.6  Total Protein 6.5 - 8.1 g/dL 6.3(L) 7.4 7.5  Total Bilirubin 0.3 - 1.2 mg/dL 0.9 0.8 1.2  Alkaline Phos 38 - 126 U/L 98 127(H) 107  AST 15 - 41 U/L _2 ALT 0 - 44 U/L _3 RADIOGRAPHIC STUDIES: I have personally reviewed the radiological images as listed and agreed  with the findings in the report. No results found.   ASSESSMENT & PLAN: 80 year old male   Adenocarcinoma of the GE junction, cTX N1 M0, HER2 negative, MMR normal, PD-L1 CPS 3% -He presented with 6-8 months of dysphagia and 10 pounds weight loss and a palpable right cervical lymph node. Initial CT neck 10/22/20 showed 2.8 cm LN, concerning for metastatic disease.  -CT 12/08/20 showed suspicious mass at GE junction and prominent gastrohepatic LNs. EGD 12/17/20 by Dr. Fuller Plan showed 5 cm distal esophageal mass, biopsy confirmed adenocarcinoma with signet ring features. HER 2 negative -PET scan 12/30/20 showed FDG-avid mass at the GE junction and mild uptake with borderline gastrohepatic ligament lymph nodes, no distant metastasis. -Cervical lymph node biopsy scheduled 11//25/22 -He began concurrent chemo RT with weekly carbo/Taxol 01/18/2021, C1 carbo was dose reduced to AUC 1.5 for age and co-morbidities. He tolerated moderately well with fatigue, decreased appetite/taste change, nausea, and constipation  2. Dysphagia and weight loss -Secondary to #1 -Onset 6-8 months ago with 10-15 pounds weight loss  -solid dysphagia is moderate, he can still tolerate soft food and some solids.  Does pretty well with liquids and medications -Some nutrition supplements make him nauseous  -Continue follow-up with dietitian, will see him today in infusion -Weight stable since starting chemo RT   3. Goals of care -thus far work up shows locally  advanced disease, right cervical LN biopsy is pending. -He understands if work-up confirms metastatic disease, it will not be curable but still treatable, treatment will be palliative   4. Comorbidities: CVA with residual right-sided weakness, history of MI, CAD, HTN, DM, HL -Patient lives alone, his daughter checks on him frequently to help with housework and food prep -He is limited in his ADLs due to residual right-sided weakness from CVA -He ambulates with support, can bathe and dress with difficulty    Disposition:  [name] appears stable.  He completed week 1 concurrent chemo RT with Taxol and dose reduced carbo to AUC of 1.5.  He tolerated moderately well with fatigue, decreased appetite/taste change, nausea, and constipation.  Baseline neuropathy is unchanged.  He continues to tolerate mostly normal diet with stable weight. Side effects are adequately managed with supportive care at home, we reviewed symptom management and nutrition.  He remains able to function well.  Labs adequate to proceed with week 2 Taxol/carbo at same dose.  He will see dietitian today.  Continue to follow-up weekly on treatment.  All questions were answered. The patient knows to call the clinic with any problems, questions or concerns. No barriers to learning were detected.     Alla Feeling, NP 01/25/21

## 2021-01-25 ENCOUNTER — Ambulatory Visit
Admission: RE | Admit: 2021-01-25 | Discharge: 2021-01-25 | Disposition: A | Discharge status: Home / Self Care | Payer: PPO | Source: Ambulatory Visit | Attending: Radiation Oncology | Admitting: Radiation Oncology

## 2021-01-25 ENCOUNTER — Inpatient Hospital Stay: Payer: PPO

## 2021-01-25 ENCOUNTER — Encounter: Payer: Self-pay | Admitting: Nurse Practitioner

## 2021-01-25 ENCOUNTER — Ambulatory Visit: Payer: PPO | Admitting: Nutrition

## 2021-01-25 ENCOUNTER — Inpatient Hospital Stay (HOSPITAL_BASED_OUTPATIENT_CLINIC_OR_DEPARTMENT_OTHER): Payer: PPO | Admitting: Nurse Practitioner

## 2021-01-25 ENCOUNTER — Inpatient Hospital Stay: Payer: PPO | Attending: Nurse Practitioner

## 2021-01-25 ENCOUNTER — Other Ambulatory Visit: Payer: Self-pay

## 2021-01-25 VITALS — BP 157/73 | HR 64 | Temp 98.0°F | Resp 18 | Wt 159.1 lb

## 2021-01-25 DIAGNOSIS — C158 Malignant neoplasm of overlapping sites of esophagus: Secondary | ICD-10-CM

## 2021-01-25 DIAGNOSIS — C155 Malignant neoplasm of lower third of esophagus: Secondary | ICD-10-CM

## 2021-01-25 DIAGNOSIS — C160 Malignant neoplasm of cardia: Secondary | ICD-10-CM | POA: Diagnosis not present

## 2021-01-25 DIAGNOSIS — Z5111 Encounter for antineoplastic chemotherapy: Secondary | ICD-10-CM | POA: Insufficient documentation

## 2021-01-25 DIAGNOSIS — Z510 Encounter for antineoplastic radiation therapy: Secondary | ICD-10-CM | POA: Diagnosis not present

## 2021-01-25 LAB — CBC WITH DIFFERENTIAL (CANCER CENTER ONLY)
Abs Immature Granulocytes: 0.06 10*3/uL (ref 0.00–0.07)
Basophils Absolute: 0 10*3/uL (ref 0.0–0.1)
Basophils Relative: 1 %
Eosinophils Absolute: 0.3 10*3/uL (ref 0.0–0.5)
Eosinophils Relative: 5 %
HCT: 40 % (ref 39.0–52.0)
Hemoglobin: 13.5 g/dL (ref 13.0–17.0)
Immature Granulocytes: 1 %
Lymphocytes Relative: 11 %
Lymphs Abs: 0.6 10*3/uL — ABNORMAL LOW (ref 0.7–4.0)
MCH: 29.4 pg (ref 26.0–34.0)
MCHC: 33.8 g/dL (ref 30.0–36.0)
MCV: 87.1 fL (ref 80.0–100.0)
Monocytes Absolute: 0.2 10*3/uL (ref 0.1–1.0)
Monocytes Relative: 4 %
Neutro Abs: 3.9 10*3/uL (ref 1.7–7.7)
Neutrophils Relative %: 78 %
Platelet Count: 136 10*3/uL — ABNORMAL LOW (ref 150–400)
RBC: 4.59 MIL/uL (ref 4.22–5.81)
RDW: 13 % (ref 11.5–15.5)
WBC Count: 5 10*3/uL (ref 4.0–10.5)
nRBC: 0 % (ref 0.0–0.2)

## 2021-01-25 LAB — CMP (CANCER CENTER ONLY)
ALT: 28 U/L (ref 0–44)
AST: 19 U/L (ref 15–41)
Albumin: 3.6 g/dL (ref 3.5–5.0)
Alkaline Phosphatase: 98 U/L (ref 38–126)
Anion gap: 6 (ref 5–15)
BUN: 18 mg/dL (ref 8–23)
CO2: 25 mmol/L (ref 22–32)
Calcium: 8.8 mg/dL — ABNORMAL LOW (ref 8.9–10.3)
Chloride: 110 mmol/L (ref 98–111)
Creatinine: 0.77 mg/dL (ref 0.61–1.24)
GFR, Estimated: >60 mL/min (ref >60)
Glucose, Bld: 91 mg/dL (ref 70–99)
Potassium: 3.9 mmol/L (ref 3.5–5.1)
Sodium: 141 mmol/L (ref 135–145)
Total Bilirubin: 0.9 mg/dL (ref 0.3–1.2)
Total Protein: 6.3 g/dL — ABNORMAL LOW (ref 6.5–8.1)

## 2021-01-25 LAB — CEA (IN HOUSE-CHCC): CEA (CHCC-In House): 2.97 ng/mL (ref 0.00–5.00)

## 2021-01-25 MED ORDER — DIPHENHYDRAMINE HCL 50 MG/ML IJ SOLN
50.0000 mg | Freq: Once | INTRAMUSCULAR | Status: AC
  Administered 2021-01-25: 50 mg via INTRAVENOUS

## 2021-01-25 MED ORDER — SODIUM CHLORIDE 0.9 % IV SOLN: Freq: Once | INTRAVENOUS | Status: AC

## 2021-01-25 MED ORDER — SODIUM CHLORIDE 0.9 % IV SOLN
50.0000 mg/m2 | Freq: Once | INTRAVENOUS | Status: AC
  Administered 2021-01-25: 96 mg via INTRAVENOUS
  Filled 2021-01-25: qty 16

## 2021-01-25 MED ORDER — SODIUM CHLORIDE 0.9 % IV SOLN
10.0000 mg | Freq: Once | INTRAVENOUS | Status: AC
  Administered 2021-01-25: 10 mg via INTRAVENOUS
  Filled 2021-01-25: qty 10

## 2021-01-25 MED ORDER — SODIUM CHLORIDE 0.9 % IV SOLN
132.1500 mg | Freq: Once | INTRAVENOUS | Status: AC
  Administered 2021-01-25: 130 mg via INTRAVENOUS
  Filled 2021-01-25: qty 13

## 2021-01-25 MED ORDER — FAMOTIDINE 20 MG IN NS 100 ML IVPB
20.0000 mg | Freq: Once | INTRAVENOUS | Status: AC
  Administered 2021-01-25: 20 mg via INTRAVENOUS

## 2021-01-25 MED ORDER — PALONOSETRON HCL INJECTION 0.25 MG/5ML
0.2500 mg | Freq: Once | INTRAVENOUS | Status: AC
  Administered 2021-01-25: 0.25 mg via INTRAVENOUS

## 2021-01-25 NOTE — Progress Notes (Signed)
Nutrition follow-up completed with patient during infusion for esophageal cancer. Also contacted daughter to discuss. Patient receiving concurrent chemoradiation therapy with weekly carbo/Taxol starting January 18, 2021.  Patient reports he is drowsy secondary to Benadryl. Patient reports little to no appetite making it hard to consume adequate nutrition.  Reports he is trying to drink Ensure Plus but feels it is too sweet and too thick. Reports taste alterations with foods tasting metallic. Noted nausea which was not resolved with Compazine.   No vomiting.  Weight documented as 159.13 pounds November 21 which is stable from 159.8 pounds November 14.  Labs include glucose 171.  Nutrition diagnosis: Unintentional weight loss has stabilized.  Intervention: Educated patient on importance of eating scheduled snacks and meals to help improve appetite. Educated patient to use milk to thin down Ensure Plus therefore making it thinner and less sweet.  Try to consume 2 daily. Continue antiemetics for nausea as needed. Educated patient on strategies for improving taste alterations. Nutrition fact sheets provided.  Questions answered.  Teach back method used.  Monitoring, evaluation, goals: Patient will tolerate adequate calories and protein to minimize weight loss throughout treatment.  Next visit: Monday, November 28 during infusion.  **Disclaimer: This note was dictated with voice recognition software. Similar sounding words can inadvertently be transcribed and this note may contain transcription errors which may not have been corrected upon publication of note.**

## 2021-01-25 NOTE — Patient Instructions (Signed)
[facility]   Discharge Instructions: Thank you for choosing [facility] to provide your oncology and hematology care.   If you have a lab appointment with the Cancer Center, please go directly to the Cancer Center and check in at the registration area.   Wear comfortable clothing and clothing appropriate for easy access to any Portacath or PICC line.   We strive to give you quality time with your provider. You may need to reschedule your appointment if you arrive late (15 or more minutes).  Arriving late affects you and other patients whose appointments are after yours.  Also, if you miss three or more appointments without notifying the office, you may be dismissed from the clinic at the provider's discretion.      For prescription refill requests, have your pharmacy contact our office and allow 72 hours for refills to be completed.    Today you received the following chemotherapy and/or immunotherapy agents: paclitaxel and carboplatin.      To help prevent nausea and vomiting after your treatment, we encourage you to take your nausea medication as directed.  BELOW ARE SYMPTOMS THAT SHOULD BE REPORTED IMMEDIATELY: *FEVER GREATER THAN 100.4 F (38 C) OR HIGHER *CHILLS OR SWEATING *NAUSEA AND VOMITING THAT IS NOT CONTROLLED WITH YOUR NAUSEA MEDICATION *UNUSUAL SHORTNESS OF BREATH *UNUSUAL BRUISING OR BLEEDING *URINARY PROBLEMS (pain or burning when urinating, or frequent urination) *BOWEL PROBLEMS (unusual diarrhea, constipation, pain near the anus) TENDERNESS IN MOUTH AND THROAT WITH OR WITHOUT PRESENCE OF ULCERS (sore throat, sores in mouth, or a toothache) UNUSUAL RASH, SWELLING OR PAIN  UNUSUAL VAGINAL DISCHARGE OR ITCHING   Items with * indicate a potential emergency and should be followed up as soon as possible or go to the Emergency Department if any problems should occur.  Please show the CHEMOTHERAPY ALERT CARD or IMMUNOTHERAPY ALERT  CARD at check-in to the Emergency Department and triage nurse.  Should you have questions after your visit or need to cancel or reschedule your appointment, please contact [facility]  Dept: [phone]  and follow the prompts.  Office hours are 8:00 a.m. to 4:30 p.m. Monday - Friday. Please note that voicemails left after 4:00 p.m. may not be returned until the following business day.  We are closed weekends and major holidays. You have access to a nurse at all times for urgent questions. Please call the main number to the clinic Dept: [phone] and follow the prompts.   For any non-urgent questions, you may also contact your provider using MyChart. We now offer e-Visits for anyone 18 and older to request care online for non-urgent symptoms. For details visit [email or web address].   Also download the MyChart app! Go to the app store, search "MyChart", open the app, select [facility], and log in with your MyChart username and password.  Due to Covid, a mask is required upon entering the hospital/clinic. If you do not have a mask, one will be given to you upon arrival. For doctor visits, patients may have 1 support person aged 18 or older with them. For treatment visits, patients cannot have anyone with them due to current Covid guidelines and our immunocompromised population.   

## 2021-01-26 ENCOUNTER — Ambulatory Visit
Admission: RE | Admit: 2021-01-26 | Discharge: 2021-01-26 | Disposition: A | Discharge status: Home / Self Care | Payer: PPO | Source: Ambulatory Visit | Attending: Radiation Oncology | Admitting: Radiation Oncology

## 2021-01-26 DIAGNOSIS — C160 Malignant neoplasm of cardia: Secondary | ICD-10-CM | POA: Diagnosis not present

## 2021-01-26 DIAGNOSIS — Z510 Encounter for antineoplastic radiation therapy: Secondary | ICD-10-CM | POA: Diagnosis not present

## 2021-01-27 ENCOUNTER — Other Ambulatory Visit: Payer: Self-pay

## 2021-01-27 ENCOUNTER — Other Ambulatory Visit: Payer: Self-pay | Admitting: Radiology

## 2021-01-27 ENCOUNTER — Ambulatory Visit
Admission: RE | Admit: 2021-01-27 | Discharge: 2021-01-27 | Disposition: A | Discharge status: Home / Self Care | Payer: PPO | Source: Ambulatory Visit | Attending: Radiation Oncology | Admitting: Radiation Oncology

## 2021-01-27 DIAGNOSIS — C160 Malignant neoplasm of cardia: Secondary | ICD-10-CM | POA: Diagnosis not present

## 2021-01-27 DIAGNOSIS — Z510 Encounter for antineoplastic radiation therapy: Secondary | ICD-10-CM | POA: Diagnosis not present

## 2021-01-29 ENCOUNTER — Ambulatory Visit (HOSPITAL_COMMUNITY)
Admission: RE | Admit: 2021-01-29 | Discharge: 2021-01-29 | Disposition: A | Discharge status: Home / Self Care | Payer: PPO | Source: Ambulatory Visit | Attending: Hematology | Admitting: Hematology

## 2021-01-29 ENCOUNTER — Encounter (HOSPITAL_COMMUNITY): Payer: Self-pay

## 2021-01-29 ENCOUNTER — Other Ambulatory Visit: Payer: Self-pay

## 2021-01-29 DIAGNOSIS — I2510 Atherosclerotic heart disease of native coronary artery without angina pectoris: Secondary | ICD-10-CM | POA: Diagnosis not present

## 2021-01-29 DIAGNOSIS — R590 Localized enlarged lymph nodes: Secondary | ICD-10-CM | POA: Diagnosis not present

## 2021-01-29 DIAGNOSIS — Z8673 Personal history of transient ischemic attack (TIA), and cerebral infarction without residual deficits: Secondary | ICD-10-CM | POA: Diagnosis not present

## 2021-01-29 DIAGNOSIS — E785 Hyperlipidemia, unspecified: Secondary | ICD-10-CM | POA: Insufficient documentation

## 2021-01-29 DIAGNOSIS — I252 Old myocardial infarction: Secondary | ICD-10-CM | POA: Insufficient documentation

## 2021-01-29 DIAGNOSIS — Z87891 Personal history of nicotine dependence: Secondary | ICD-10-CM | POA: Diagnosis not present

## 2021-01-29 DIAGNOSIS — I10 Essential (primary) hypertension: Secondary | ICD-10-CM | POA: Diagnosis not present

## 2021-01-29 DIAGNOSIS — R0600 Dyspnea, unspecified: Secondary | ICD-10-CM | POA: Diagnosis not present

## 2021-01-29 DIAGNOSIS — E118 Type 2 diabetes mellitus with unspecified complications: Secondary | ICD-10-CM | POA: Insufficient documentation

## 2021-01-29 DIAGNOSIS — C155 Malignant neoplasm of lower third of esophagus: Secondary | ICD-10-CM | POA: Insufficient documentation

## 2021-01-29 DIAGNOSIS — K116 Mucocele of salivary gland: Secondary | ICD-10-CM | POA: Diagnosis not present

## 2021-01-29 LAB — GLUCOSE, CAPILLARY: Glucose-Capillary: 137 mg/dL — ABNORMAL HIGH (ref 70–99)

## 2021-01-29 MED ORDER — LIDOCAINE HCL (PF) 1 % IJ SOLN
INTRAMUSCULAR | Status: AC
  Filled 2021-01-29: qty 30

## 2021-01-29 MED FILL — Dexamethasone Sodium Phosphate Inj 100 MG/10ML: INTRAMUSCULAR | Qty: 1 | Status: AC

## 2021-01-29 NOTE — H&P (Signed)
Chief Complaint: Patient was seen in consultation today for cervical lymph node biopsy at the request of Feng,[name]  Referring Physician(s): Feng,[name]  Supervising Physician: Arne Cleveland  Patient Status: [facility] - Out-pt  History of Present Illness: Jeffrey [name] is a 80 y.o. male with PMH of CVA, MI, CAD, HTN, DM, HLD, dysphagia and esophageal cancer. Pt had PET scan 12/31/20  and [place] study 01/08/21  that showed cystic lesion within right cervical lymph node. Pt referred today by Dr. Truitt Merle for biopsy of right cervical lymph node. Biopsy was approved by Dr. Serafina Royals, IR.   PET scan 12/31/20:  ADDENDUM: Not mentioned in the original report is a cystic structure within the right level 2 region measuring 2.6 by 1.6 cm, image 30/4. This measures fluid density without significant radiotracer uptake, image 30/4. Etiology is indeterminate. This is indeterminate. Necrotic lymph node cannot be excluded. Further evaluation with ultrasound-guided fine needle aspiration is advised. US Soft tissue neck 01/08/21: IMPRESSION: Sonographic evaluation of the right-side of the neck confirms a minimally complex cystic lesion with broad differential considerations including cystic/necrotic cervical lymphadenopathy (favored given presence of an additional smaller apparently partially cystic right cervical lymph node), compatible with the findings on preceding PET-CT.   Past Medical History:  Diagnosis Date   Anterior cerebral circulation hemorrhagic infarction [name]) 11/24/2014   March, 2016, dominant left thalamic and left internal capsule ischemic infarct with resultant hemorrhagic transformation, secondary to small vessel disease. Resulted in right hemiparesis dysarthria and diplopia   //   readmission with aphasia July, 2016, this improved    Arthritis    CAD (coronary artery disease)    PCI distal RCA ...2004, residual 70% LAD   /   ...nuclear...03/2007...no ischemia.Marland KitchenMarland Kitchenpreserved LV /   nuclear...03/03/2009...inferior scar..no ischemia..EF 51%   Cyst of nasopharynx    per ENT Wilburn Cornelia   Dyslipidemia    takes Atorvastatin daily   GERD (gastroesophageal reflux disease)    takes Omeprazole daily   Gout    takes Allopurinol daily and Colchicine as needed   HCAP (healthcare-associated pneumonia)    Hemiparesis affecting right side as late effect of cerebrovascular accident ([name]) 09/05/2014   History of colon polyps    HTN (hypertension)    takes Cardura,Metoprolol,[name],and Amlodipine daily   Internal hemorrhoids    Myocardial infarction [facility]) 102yrs ago   Peripheral edema    takes Lasix daily   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 [name])    Past Surgical History:  Procedure Laterality Date   BALLOON ANGIOPLASTY, ARTERY  1992, 2004   CAD, Dr. Ron Parker   CARDIAC CATHETERIZATION  2004   CATARACT EXTRACTION Bilateral 2010   COLONOSCOPY  04/03/2002   adenomatous polyp, int hemorrhoids   COLONOSCOPY  07/18/2007   normal (Dr. Fuller Plan)   COLONOSCOPY  09/26/2012   tubular adenoma, sm int hem, rpt 5 yrs Fuller Plan)   ELBOW SURGERY Right 1997   eye lids raised     NASAL SEPTUM SURGERY     POLYPECTOMY N/A 05/27/2013   Procedure: ENDOSCOPIC NASOPHARYNGEAL MASS;  Surgeon: [name], MD   ROTATOR CUFF REPAIR  09/2011   left, with subacromial decompression   TONSILLECTOMY AND ADENOIDECTOMY      Allergies: Niacin  Medications: Prior to  Admission medications   Medication Sig Start Date End Date Taking? Authorizing Provider  atorvastatin (LIPITOR) 80 MG tablet Take 80 mg by mouth at bedtime.   Yes [provider]  baclofen (LIORESAL) 10 MG tablet Take 10 mg by mouth 3 (three) times daily. 07/08/20  Yes [provider]  cholecalciferol  (VITAMIN D3) 25 MCG (1000 UNIT) tablet Take 1,000 Units by mouth daily.   Yes [provider]  empagliflozin (JARDIANCE) 25 MG TABS tablet Take 25 mg by mouth daily. 07/08/20  Yes [provider]  fosinopril ([name]) 40 MG tablet Take 1 tablet (40 mg total) by mouth daily. 10/14/20  Yes [name], MD  furosemide (LASIX) 20 MG tablet Take 20-40 mg by mouth See admin instructions. Take 20 mg on Mon, Wed, Fri, and Sun. Take 40 mg on Tues, Thurs, and Sat   Yes [provider]  insulin aspart (NOVOLOG FLEXPEN) 100 UNIT/ML FlexPen Inject 10 Units into the skin 2 (two) times daily with a meal. Breakfast and dinner 04/29/20  Yes [name], MD  insulin glargine (LANTUS) 100 UNIT/ML Solostar Pen Inject 24 Units into the skin at bedtime. 10/14/20  Yes [name], MD  LACTOBACILLUS PO Take 1 capsule by mouth every Monday, Wednesday, and Friday.   Yes [provider]  loratadine (CLARITIN) 10 MG tablet Take 10 mg by mouth.   Yes [provider]  metoprolol tartrate (LOPRESSOR) 25 MG tablet Take 1 tablet (25 mg total) by mouth 2 (two) times daily. 10/14/20  Yes [name], MD  Olopatadine HCl (PATADAY OP) Place 1 drop into both eyes daily as needed (allergies).   Yes [provider]  ondansetron (ZOFRAN) 8 MG tablet Take 1 tablet (8 mg total) by mouth 2 (two) times daily as needed for refractory nausea / vomiting. Start on day 3 after chemo. 12/31/20  Yes Truitt Merle, MD  pantoprazole (PROTONIX) 20 MG tablet TAKE 1 TABLET BY MOUTH EVERY DAY 01/11/21  Yes [name], MD  Potassium 99 MG TABS Take 1 tablet (99 mg total) by mouth daily as needed (when you take 2 lasix pills). Patient taking differently: Take 1 tablet by mouth 3 (three) times a week. [name], and Sat 05/13/20  Yes [name], MD  prochlorperazine (COMPAZINE) 10 MG tablet Take 1 tablet (10 mg total) by mouth every 6 (six) hours as needed (Nausea or vomiting).  12/31/20  Yes Truitt Merle, MD  sertraline (ZOLOFT) 25 MG tablet Take 1 tablet (25 mg total) by mouth daily. 10/14/20  Yes [name], MD  clopidogrel (PLAVIX) 75 MG tablet Take 1 tablet (75 mg total) by mouth daily. 10/14/20   [name], MD  mupirocin cream (BACTROBAN) 2 % Apply 1 application topically 2 (two) times daily. Patient not taking: Reported on 01/26/2021 10/23/20   [name], MD     Family History  Problem Relation Age of Onset   Alcohol abuse Father    Heart attack Father    Cancer Sister 72       breast cancer   Heart attack Son    Coronary artery disease Neg Hx    Stroke Neg Hx    Diabetes Neg Hx    Colon cancer Neg Hx     Social History   Socioeconomic History   Marital status: Married    Spouse name: Not on file   Number of children: 2   Years of education: Bachelors   Highest education level: Not on file  Occupational History  Occupation: Music therapist: [place]  Tobacco Use   Smoking status: Former   Smokeless tobacco: Never   Tobacco comments:    quit smoking at age 13  Vaping Use   Vaping Use: Never used  Substance and Sexual Activity   Alcohol use: No   Drug use: No   Sexual activity: Yes  Other Topics Concern   Not on file  Social History Narrative   Caffeine: 6-7 cups coffee   Lives with wife, no pets   2 grown children, 4 grandchildren   Occu: Artist   Edu: [employer]   Activity: not much   Diet: plenty of water, fruits and vegetables   Some care through [place]      Endo: Dr. Buddy Duty   Social Determinants of Health   Financial Resource Strain: Low Risk    Difficulty of Paying Living Expenses: Not hard at all  Food Insecurity: No Food Insecurity   Worried About Charity fundraiser in the Last Year: Never true   [place] in the Last Year: Never true  Transportation Needs: No Transportation Needs   Lack of Transportation (Medical): No   Lack of Transportation  (Non-Medical): No  Physical Activity: Sufficiently Active   Days of Exercise per Week: 5 days   Minutes of Exercise per Session: 30 min  Stress: No Stress Concern Present   Feeling of Stress : Not at all  Social Connections: Not on file    Review of Systems: A 12 point ROS discussed and pertinent positives are indicated in the HPI above.  All other systems are negative.  Review of Systems  Constitutional:  Positive for appetite change. Negative for chills and fever.  HENT:  Negative for nosebleeds.   Eyes:  Negative for visual disturbance.  Respiratory:  Negative for cough and shortness of breath.   Cardiovascular:  Negative for chest pain and leg swelling.  Gastrointestinal:  Positive for nausea. Negative for abdominal pain, blood in stool, diarrhea and vomiting.  Genitourinary:  Negative for hematuria.  Neurological:  Negative for dizziness, light-headedness and headaches.   Vital Signs: BP (!) 169/78   Pulse 68   Temp 98.2 F (36.8 C) (Oral)   Resp 12   Ht 5\' 8"  (1.727 m)   Wt 158 lb (71.7 kg)   SpO2 100%   BMI 24.02 kg/m   Physical Exam Constitutional:      Appearance: Normal appearance. He is not ill-appearing.  HENT:     Head: Normocephalic and atraumatic.     Mouth/Throat:     Mouth: Mucous membranes are moist.     Pharynx: Oropharynx is clear.  Cardiovascular:     Rate and Rhythm: Normal rate and regular rhythm.     Pulses: Normal pulses.     Heart sounds: Normal heart sounds. No murmur heard.   No friction rub. No gallop.  Pulmonary:     Effort: Pulmonary effort is normal. No respiratory distress.     Breath sounds: Normal breath sounds. No stridor. No wheezing, rhonchi or rales.  Abdominal:     General: Bowel sounds are normal. There is no distension.     Tenderness: There is no abdominal tenderness. There is no guarding.  Musculoskeletal:     Right lower leg: Edema present.     Left lower leg: Edema present.     Comments: Mild bilateral LLE RUE  weakness d/t hx of CVA  Skin:    General:  Skin is warm and dry.  Neurological:     Mental Status: He is alert and oriented to person, place, and time.  Psychiatric:        Mood and Affect: Mood normal.        Behavior: Behavior normal.        Thought Content: Thought content normal.        Judgment: Judgment normal.    Imaging: US SOFT TISSUE HEAD & NECK (NON-THYROID)  Result Date: 01/08/2021 CLINICAL DATA:  Cystic structure within the right-side of the neck demonstrated on preceding PET-CT. EXAM: ULTRASOUND OF HEAD/NECK SOFT TISSUES TECHNIQUE: Ultrasound examination of the head and neck soft tissues was performed in the area of clinical concern. COMPARISON:  PET-CT-12/30/2020 FINDINGS: Sonographic evaluation of the right neck correlates with a fairly well-defined approximately 3.0 x 2.9 x 1.9 cm minimally complex cystic lesion with very minimal amount of mural wall nodularity (representative images 4 and 14). There is an additional approximately 0.9 x 0.6 x 0.4 cm partially cystic presumed lymph node within the right neck (image 31) as well as a non pathologically enlarged though somewhat abnormal appearing additional right cervical lymph node which measures approximately 0.3 cm in diameter (image 37). Sonographic evaluation of the left neck demonstrates a mildly prominent though non pathologically enlarged left cervical lymph node which measures 0.5 cm in diameter though does not definitively demonstrate a benign fatty hilum. IMPRESSION: Sonographic evaluation of the right-side of the neck confirms a minimally complex cystic lesion with broad differential considerations including cystic/necrotic cervical lymphadenopathy (favored given presence of an additional smaller apparently partially cystic right cervical lymph node), compatible with the findings on preceding PET-CT. This minimally complex cystic structure/mass could undergo ultrasound-guided biopsy/aspiration as indicated. Electronically  Signed   By: Sandi Mariscal M.D.   On: 01/08/2021 13:08    Labs:  CBC: Recent Labs    10/14/20 1220 12/24/20 1232 01/18/21 1225 01/25/21 1035  WBC 6.7 6.9 7.2 5.0  HGB 14.6 15.6 15.6 13.5  HCT 44.5 46.6 47.5 40.0  PLT 162.0 163 167 136*    COAGS: No results for input(s): INR, APTT in the last 8760 hours.  BMP: Recent Labs    11/30/20 1152 12/24/20 1232 01/18/21 1225 01/25/21 1035  NA 142 140 143 141  K 3.6 3.7 4.0 3.9  CL 103 104 105 110  CO2 32 26 30 25   GLUCOSE 71 85 171* 91  BUN 18 18 20 18   CALCIUM 9.5 9.6 9.6 8.8*  CREATININE 0.99 1.07 1.07 0.77  GFRNONAA  --  >60 >60 >60    LIVER FUNCTION TESTS: Recent Labs    04/08/20 1059 12/24/20 1232 01/18/21 1225 01/25/21 1035  BILITOT 1.0 1.2 0.8 0.9  AST 17 23 21 19   ALT 23 25 27 28   ALKPHOS 94 107 127* 98  PROT 6.6 7.5 7.4 6.3*  ALBUMIN 4.1 4.3 4.1 3.6    TUMOR MARKERS: No results for input(s): AFPTM, CEA, CA199, CHROMGRNA in the last 8760 hours.  Assessment and Plan: History of CVA, MI, CAD, HTN, DM, HLD, dysphagia and esophageal cancer. Pt had PET scan 12/31/20  and [place] study 01/08/21  that showed cystic lesion within right cervical lymph node. Pt referred today by Dr. Truitt Merle for biopsy of right cervical lymph node. Biopsy was approved by Dr. Serafina Royals, IR.   PET scan 12/31/20:  ADDENDUM: Not mentioned in the original report is a cystic structure within the right level 2 region measuring 2.6 by 1.6 cm, image  30/4. This measures fluid density without significant radiotracer uptake, image 30/4. Etiology is indeterminate. This is indeterminate. Necrotic lymph node cannot be excluded. Further evaluation with ultrasound-guided fine needle aspiration is advised. US Soft tissue neck 01/08/21: IMPRESSION: Sonographic evaluation of the right-side of the neck confirms a minimally complex cystic lesion with broad differential considerations including cystic/necrotic cervical lymphadenopathy (favored given presence  of an additional smaller apparently partially cystic right cervical lymph node), compatible with the findings on preceding PET-CT.  Pt A&O, calm and pleasant. He is in no distress.  Pt's daughter at bedside. He is NPO per order.  No labs needed today.  VSS   Risks and benefits of cervical lymph node biopsy was discussed with the patient and/or patient's family including, but not limited to bleeding, infection, damage to adjacent structures or low yield requiring additional tests.  All of the questions were answered and there is agreement to proceed.  Consent signed by patient's daughter per pt request and in chart.   Thank you for this interesting consult.  I greatly enjoyed meeting MILBERN DOESCHER and look forward to participating in their care.  A copy of this report was sent to the requesting provider on this date.  Electronically Signed: Tyson Alias, NP 01/29/2021, 10:46 AM   I spent a total of 30 minutes n face to face in clinical consultation, greater than 50% of which was counseling/coordinating care for image guided cervical lymph  node biopsy.

## 2021-01-31 NOTE — Progress Notes (Signed)
[place]   Telephone:(336) [phone] Fax:(336) [phone]   Clinic Follow up Note   Patient Care Team: [name], MD as PCP - General (Family Medicine) Jerline Pain, MD as PCP - Cardiology (Cardiology) Delia Chimes, NP (Inactive) as Nurse Practitioner (Nurse Practitioner) Ladene Artist, MD as Consulting Physician (Gastroenterology) Alla Feeling, NP as Nurse Practitioner (Nurse Practitioner) Truitt Merle, MD as Consulting Physician (Hematology) Truitt Merle, MD as Consulting Physician (Oncology) 02/01/2021  CHIEF COMPLAINT: Follow up esophageal cancer   SUMMARY OF ONCOLOGIC HISTORY: Oncology History Overview Note  Cancer Staging Esophageal cancer [facility]) Staging form: Esophagus - Adenocarcinoma, AJCC 8th Edition - Clinical stage from 12/31/2020: Stage IIA (cT1, cN1, cM0, GX) - Unsigned    Esophageal cancer ([place])  10/22/2020 Imaging   CT neck IMPRESSION: 2.8 cm right level 2A lymph node, which appears to be centrally necrotic, concerning for metastatic disease. No additional lymphadenopathy or other acute process in the neck.     11/30/2020 Imaging   Barium swallow FINDINGS: Limitations on positioning due to sequelae of stroke. The patient swallowed barium without difficulty. No aspiration. There is no mass or stricture. Normal motility. There is no hiatal hernia. No spontaneous gastroesophageal reflux. Barium pill was swallowed and passed without difficulty.   IMPRESSION: No significant abnormality.   12/08/2020 Imaging   CT chest abdomen with contrast IMPRESSION: 1. Mass is suspected just at the level of the GE junction. Suspicious for primary esophageal neoplasm. Further evaluation with endoscopy is advised. 2. Prominent perigastric lymph nodes are identified, suspicious for metastatic adenopathy. 3. Gallstone. 4. Coronary artery calcifications. 5. Aortic Atherosclerosis (ICD10-I70.0) and Emphysema (ICD10-J43.9). 6. These results will be called  to the ordering clinician or representative by the Radiologist Assistant, and communication documented in the PACS or [employer].     12/17/2020 Procedure   EGD by Dr. Fuller Plan - A medium-sized, fungating mass with contact bleeding was found in the distal esophagus, 36 cm from the incisors extending to 41 cm in the cardia. The mass was partially obstructing and circumferential.   12/17/2020 Initial Biopsy   Diagnosis Esophagus, biopsy, distal and gastric cardia mass - ADENOCARCINOMA WITH SIGNET RING FEATURES.   12/19/2020 Initial Diagnosis   Esophageal cancer ([place])   12/30/2020 PET scan   IMPRESSION: 1. Mass at the level of the GE junction and proximal stomach is FDG avid within SUV max of 7.62. Findings compatible with primary gastric neoplasm. 2. There is mild FDG uptake associated with borderline gastrohepatic ligament lymph nodes which have an SUV max of 2.97. Equivocal for nodal metastasis. 3. No signs of distant metastatic disease. 4. Aortic Atherosclerosis (ICD10-I70.0) and Emphysema (ICD10-J43.9). Coronary artery calcifications. 5. Gallstones. 6. Prostate gland enlargement.   01/08/2021 Imaging   EXAM: ULTRASOUND OF HEAD/NECK SOFT TISSUES  IMPRESSION: Sonographic evaluation of the right-side of the neck confirms a minimally complex cystic lesion with broad differential considerations including cystic/necrotic cervical lymphadenopathy (favored given presence of an additional smaller apparently partially cystic right cervical lymph node), compatible with the findings on preceding PET-CT.   This minimally complex cystic structure/mass could undergo ultrasound-guided biopsy/aspiration as indicated.   01/18/2021 -  Chemotherapy   Patient is on Treatment Plan : ESOPHAGUS Carboplatin/PACLitaxel weekly x 6 weeks with XRT         CURRENT THERAPY: concurrent chemoRT with weekly carbo/taxol, starting 01/18/21  INTERVAL HISTORY: Jeffrey [name] returns for follow up and  treatment as scheduled. Last seen 11/21 starting week 2 ccRT. LN biopsy 11/25 result is  pending.  He did not have a good week with nutrition, taste is decreased and appetite is low.  He has to eat small amounts frequently but slowly, the schedule over the week prevented him from eating at times such as the day of the lymph node biopsy he did not eat all day.  He is working with nutrition, tolerating some supplements.  Overall swallowing has improved on chemo RT.  His wife recently was admitted to the hospital for dehydration after COVID, this has been weighing on his mind.  He did not sleep well last night.  Energy throughout the day fluctuates.  He gets nauseous when eating, takes Compazine every 6h.  No vomiting.  Bowels moving slowly.  Otherwise denies fever, chills, cough, chest pain, dyspnea, rash, neuropathy, mucositis, pain, or any other specific complaints.   MEDICAL HISTORY:  Past Medical History:  Diagnosis Date   Anterior cerebral circulation hemorrhagic infarction [facility]) 11/24/2014   March, 2016, dominant left thalamic and left internal capsule ischemic infarct with resultant hemorrhagic transformation, secondary to small vessel disease. Resulted in right hemiparesis dysarthria and diplopia   //   readmission with aphasia July, 2016, this improved    Arthritis    CAD (coronary artery disease)    PCI distal RCA ...2004, residual 70% LAD   /   ...nuclear...03/2007...no ischemia.Marland KitchenMarland Kitchenpreserved LV /  nuclear...03/03/2009...inferior scar..no ischemia..EF 51%   Cyst of nasopharynx    per ENT Wilburn Cornelia   Dyslipidemia    takes Atorvastatin daily   GERD (gastroesophageal reflux disease)    takes Omeprazole daily   Gout    takes Allopurinol daily and Colchicine as needed   HCAP (healthcare-associated pneumonia)    Hemiparesis affecting right side as late effect of cerebrovascular accident ([place]) 09/05/2014   History of colon polyps    HTN (hypertension)    takes Cardura,Metoprolol,[name],and  Amlodipine daily   Internal hemorrhoids    Myocardial infarction [facility]) 50yr ago   Peripheral edema    takes Lasix daily   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    SURGICAL HISTORY: Past Surgical History:  Procedure Laterality Date   BALLOON ANGIOPLASTY, ARTERY  1992, 2004   CAD, Dr. KRon Parker  CARDIAC CATHETERIZATION  2004   CATARACT EXTRACTION Bilateral 2010   COLONOSCOPY  04/03/2002   adenomatous polyp, int hemorrhoids   COLONOSCOPY  07/18/2007   normal (Dr. SFuller Plan   COLONOSCOPY  09/26/2012   tubular adenoma, sm int hem, rpt 5 yrs (Fuller Plan   ELBOW SURGERY Right 1997   eye lids raised     NASAL SEPTUM SURGERY     POLYPECTOMY N/A 05/27/2013   Procedure: ENDOSCOPIC NASOPHARYNGEAL MASS;  Surgeon: DJerrell Belfast MD   ROTATOR CUFF REPAIR  09/2011   left, with subacromial decompression   TONSILLECTOMY AND ADENOIDECTOMY      I have reviewed the social history and family history with the patient and they are unchanged from previous note.  ALLERGIES:  is allergic to niacin.  MEDICATIONS:  Current Outpatient Medications  Medication Sig Dispense Refill   megestrol (MEGACE ES) 625 MG/5ML suspension Take 5 mLs (625 mg total) by mouth daily. 150 mL 2   atorvastatin (LIPITOR) 80 MG tablet Take 80 mg by mouth at bedtime.  baclofen (LIORESAL) 10 MG tablet Take 10 mg by mouth 3 (three) times daily.     cholecalciferol (VITAMIN D3) 25 MCG (1000 UNIT) tablet Take 1,000 Units by mouth daily.     clopidogrel (PLAVIX) 75 MG tablet Take 1 tablet (75 mg total) by mouth daily.     empagliflozin (JARDIANCE) 25 MG TABS tablet Take 25 mg by mouth daily.     fosinopril ([name]) 40 MG tablet Take 1 tablet (40 mg total) by mouth daily.     furosemide (LASIX) 20 MG tablet  Take 20-40 mg by mouth See admin instructions. Take 20 mg on Mon, Wed, Fri, and Sun. Take 40 mg on Tues, Thurs, and Sat     insulin aspart (NOVOLOG FLEXPEN) 100 UNIT/ML FlexPen Inject 10 Units into the skin 2 (two) times daily with a meal. Breakfast and dinner     insulin glargine (LANTUS) 100 UNIT/ML Solostar Pen Inject 24 Units into the skin at bedtime.     LACTOBACILLUS PO Take 1 capsule by mouth every Monday, Wednesday, and Friday.     loratadine (CLARITIN) 10 MG tablet Take 10 mg by mouth.     metoprolol tartrate (LOPRESSOR) 25 MG tablet Take 1 tablet (25 mg total) by mouth 2 (two) times daily.     mupirocin cream (BACTROBAN) 2 % Apply 1 application topically 2 (two) times daily. (Patient not taking: Reported on 01/26/2021) 15 g 0   Olopatadine HCl (PATADAY OP) Place 1 drop into both eyes daily as needed (allergies).     ondansetron (ZOFRAN) 8 MG tablet Take 1 tablet (8 mg total) by mouth 2 (two) times daily as needed for refractory nausea / vomiting. Start on day 3 after chemo. 30 tablet 1   pantoprazole (PROTONIX) 20 MG tablet TAKE 1 TABLET BY MOUTH EVERY DAY 30 tablet 3   Potassium 99 MG TABS Take 1 tablet (99 mg total) by mouth daily as needed (when you take 2 lasix pills). (Patient taking differently: Take 1 tablet by mouth 3 (three) times a week. Tues, Thurs, and Sat)     prochlorperazine (COMPAZINE) 10 MG tablet Take 1 tablet (10 mg total) by mouth every 6 (six) hours as needed (Nausea or vomiting). 30 tablet 1   sertraline (ZOLOFT) 25 MG tablet Take 1 tablet (25 mg total) by mouth daily.     No current facility-administered medications for this visit.   Facility-Administered Medications Ordered in Other Visits  Medication Dose Route Frequency Provider Last Rate Last Admin   CARBOplatin (PARAPLATIN) 130 mg in sodium chloride 0.9 % 100 mL chemo infusion  130 mg Intravenous Once Truitt Merle, MD       PACLitaxel (TAXOL) 96 mg in sodium chloride 0.9 % 250 mL chemo infusion (</= 49m/m2)  50  mg/m2 (Treatment Plan Recorded) Intravenous Once FTruitt Merle MD 266 mL/hr at 02/01/21 1304 96 mg at 02/01/21 1304    PHYSICAL EXAMINATION: ECOG PERFORMANCE STATUS: 1 - Symptomatic but completely ambulatory  Vitals:   02/01/21 1043  BP: (!) 142/76  Pulse: (!) 56  Resp: 17  Temp: 97.9 F (36.6 C)  SpO2: 100%   Filed Weights   02/01/21 1043  Weight: 153 lb 4.8 oz (69.5 kg)    GENERAL:alert, no distress and comfortable SKIN: no rash or erythema in the treatment field EYES: sclera clear LUNGS:  normal breathing effort HEART: no lower extremity edema NEURO: alert & oriented x 3 with fluent speech  LABORATORY DATA:  I have reviewed the data as listed CBC  Latest Ref Rng & Units 02/01/2021 01/25/2021 01/18/2021  WBC 4.0 - 10.5 K/uL 2.9(L) 5.0 7.2  Hemoglobin 13.0 - 17.0 g/dL 14.1 13.5 15.6  Hematocrit 39.0 - 52.0 % 42.2 40.0 47.5  Platelets 150 - 400 K/uL 129(L) 136(L) 167     CMP Latest Ref Rng & Units 02/01/2021 01/25/2021 01/18/2021  Glucose 70 - 99 mg/dL 135(H) 91 171(H)  BUN 8 - 23 mg/dL _0 Creatinine 0.61 - 1.24 mg/dL 0.88 0.77 1.07  Sodium 135 - 145 mmol/L 142 141 143  Potassium 3.5 - 5.1 mmol/L 3.7 3.9 4.0  Chloride 98 - 111 mmol/L 106 110 105  CO2 22 - 32 mmol/L _1 Calcium 8.9 - 10.3 mg/dL 9.2 8.8(L) 9.6  Total Protein 6.5 - 8.1 g/dL 6.4(L) 6.3(L) 7.4  Total Bilirubin 0.3 - 1.2 mg/dL 0.7 0.9 0.8  Alkaline Phos 38 - 126 U/L 98 98 127(H)  AST 15 - 41 U/L _2 ALT 0 - 44 U/L _3 RADIOGRAPHIC STUDIES: I have personally reviewed the radiological images as listed and agreed with the findings in the report. No results found.   ASSESSMENT & PLAN: 80 year old male   Adenocarcinoma of the GE junction, cTX N1 M0, HER2 negative, MMR normal, PD-L1 CPS 3% -He presented with 6-8 months of dysphagia and 10 pounds weight loss and a palpable right cervical lymph node. Initial CT neck 10/22/20 showed 2.8 cm LN, concerning for metastatic disease.   -CT 12/08/20 showed suspicious mass at GE junction and prominent gastrohepatic LNs. EGD 12/17/20 by Dr. Fuller Plan showed 5 cm distal esophageal mass, biopsy confirmed adenocarcinoma with signet ring features. HER 2 negative -PET scan 12/30/20 showed FDG-avid mass at the GE junction and mild uptake with borderline gastrohepatic ligament lymph nodes, no distant metastasis. -Cervical lymph node biopsy scheduled 11//25/22 -He began concurrent chemo RT with weekly carbo/Taxol 01/18/2021, C1 carbo was dose reduced to AUC 1.5 for age and co-morbidities. He tolerated moderately well with fatigue, decreased appetite/taste change, nausea, and constipation. He lost weight on week 2.   2. Dysphagia and weight loss -Secondary to #1 -Onset 6-8 months ago with 10-15 pounds weight loss  -solid dysphagia is moderate, he can still tolerate soft food and some solids.  Does pretty well with liquids and medications -Some nutrition supplements make him nauseous  -Continue follow-up with dietitian, will see him today in infusion -Weight was stable after week 1 but he lost 6 pounds on week 2   3. Goals of care -thus far work up shows locally advanced disease, right cervical LN biopsy is pending. -He understands if work-up confirms metastatic disease, it will not be curable but still treatable, treatment will be palliative   4. Comorbidities: CVA with residual right-sided weakness, history of MI, CAD, HTN, DM, HL -Patient lives alone, his daughter checks on him frequently to help with housework and food prep -He is limited in his ADLs due to residual right-sided weakness from CVA -He ambulates with support, can bathe and dress with difficulty      Disposition: Jeffrey [name] appears stable.  He completed 2 weeks concurrent chemo RT with carbo/Taxol.  He is tolerating treatment moderately well with mild fatigue, nausea, decreased taste, low appetite, and weight loss.  Weight has decreased 6 pounds in 1 week somewhat chemo  related and being n.p.o. for procedure.    We reviewed symptom management for nausea, nutrition/hydration, and appetite stimulants including mirtazapine, Marinol, and  Megace.  He is on sertraline and declined Marinol.  We will try Megace, I prescribed today.  Continue follow-up with dietitian.  We discussed if he continues to have significant weight loss on treatment, we may dose reduce or hold certain chemo and/or recommend a feeding tube.  Side effects are managed with supportive care at home, otherwise able to recover and function well.  Labs reviewed, platelet and ANC are trending down but adequate for treatment, proceed with week 3 carbo/Taxol today at same dose. LN biopsy path is pending, we will f/up.  Follow-up weekly on treatment.  All questions were answered. The patient knows to call the clinic with any problems, questions or concerns. No barriers to learning were detected.      Alla Feeling, NP 02/01/21

## 2021-02-01 ENCOUNTER — Ambulatory Visit
Admission: RE | Admit: 2021-02-01 | Discharge: 2021-02-01 | Disposition: A | Discharge status: Home / Self Care | Payer: PPO | Source: Ambulatory Visit | Attending: Radiation Oncology | Admitting: Radiation Oncology

## 2021-02-01 ENCOUNTER — Encounter: Payer: Self-pay | Admitting: Nurse Practitioner

## 2021-02-01 ENCOUNTER — Inpatient Hospital Stay: Payer: PPO

## 2021-02-01 ENCOUNTER — Inpatient Hospital Stay: Payer: PPO | Admitting: Nutrition

## 2021-02-01 ENCOUNTER — Other Ambulatory Visit: Payer: Self-pay

## 2021-02-01 ENCOUNTER — Inpatient Hospital Stay (HOSPITAL_BASED_OUTPATIENT_CLINIC_OR_DEPARTMENT_OTHER): Payer: PPO | Admitting: Nurse Practitioner

## 2021-02-01 VITALS — BP 142/76 | HR 56 | Temp 97.9°F | Resp 17 | Wt 153.3 lb

## 2021-02-01 DIAGNOSIS — Z510 Encounter for antineoplastic radiation therapy: Secondary | ICD-10-CM | POA: Diagnosis not present

## 2021-02-01 DIAGNOSIS — C158 Malignant neoplasm of overlapping sites of esophagus: Secondary | ICD-10-CM | POA: Diagnosis not present

## 2021-02-01 DIAGNOSIS — C155 Malignant neoplasm of lower third of esophagus: Secondary | ICD-10-CM

## 2021-02-01 DIAGNOSIS — C160 Malignant neoplasm of cardia: Secondary | ICD-10-CM | POA: Diagnosis not present

## 2021-02-01 LAB — CMP (CANCER CENTER ONLY)
ALT: 21 U/L (ref 0–44)
AST: 16 U/L (ref 15–41)
Albumin: 3.7 g/dL (ref 3.5–5.0)
Alkaline Phosphatase: 98 U/L (ref 38–126)
Anion gap: 9 (ref 5–15)
BUN: 18 mg/dL (ref 8–23)
CO2: 27 mmol/L (ref 22–32)
Calcium: 9.2 mg/dL (ref 8.9–10.3)
Chloride: 106 mmol/L (ref 98–111)
Creatinine: 0.88 mg/dL (ref 0.61–1.24)
GFR, Estimated: >60 mL/min (ref >60)
Glucose, Bld: 135 mg/dL — ABNORMAL HIGH (ref 70–99)
Potassium: 3.7 mmol/L (ref 3.5–5.1)
Sodium: 142 mmol/L (ref 135–145)
Total Bilirubin: 0.7 mg/dL (ref 0.3–1.2)
Total Protein: 6.4 g/dL — ABNORMAL LOW (ref 6.5–8.1)

## 2021-02-01 LAB — CBC WITH DIFFERENTIAL (CANCER CENTER ONLY)
Abs Immature Granulocytes: 0.01 10*3/uL (ref 0.00–0.07)
Basophils Absolute: 0.1 10*3/uL (ref 0.0–0.1)
Basophils Relative: 2 %
Eosinophils Absolute: 0.3 10*3/uL (ref 0.0–0.5)
Eosinophils Relative: 11 %
HCT: 42.2 % (ref 39.0–52.0)
Hemoglobin: 14.1 g/dL (ref 13.0–17.0)
Immature Granulocytes: 0 %
Lymphocytes Relative: 15 %
Lymphs Abs: 0.4 10*3/uL — ABNORMAL LOW (ref 0.7–4.0)
MCH: 29.4 pg (ref 26.0–34.0)
MCHC: 33.4 g/dL (ref 30.0–36.0)
MCV: 87.9 fL (ref 80.0–100.0)
Monocytes Absolute: 0.3 10*3/uL (ref 0.1–1.0)
Monocytes Relative: 10 %
Neutro Abs: 1.8 10*3/uL (ref 1.7–7.7)
Neutrophils Relative %: 62 %
Platelet Count: 129 10*3/uL — ABNORMAL LOW (ref 150–400)
RBC: 4.8 MIL/uL (ref 4.22–5.81)
RDW: 13 % (ref 11.5–15.5)
WBC Count: 2.9 10*3/uL — ABNORMAL LOW (ref 4.0–10.5)
nRBC: 0 % (ref 0.0–0.2)

## 2021-02-01 MED ORDER — DIPHENHYDRAMINE HCL 50 MG/ML IJ SOLN
50.0000 mg | Freq: Once | INTRAMUSCULAR | Status: AC
  Administered 2021-02-01: 12:00:00 50 mg via INTRAVENOUS
  Filled 2021-02-01: qty 1

## 2021-02-01 MED ORDER — FAMOTIDINE 20 MG IN NS 100 ML IVPB
20.0000 mg | Freq: Once | INTRAVENOUS | Status: AC
  Administered 2021-02-01: 12:00:00 20 mg via INTRAVENOUS
  Filled 2021-02-01: qty 100

## 2021-02-01 MED ORDER — SODIUM CHLORIDE 0.9 % IV SOLN
50.0000 mg/m2 | Freq: Once | INTRAVENOUS | Status: AC
  Administered 2021-02-01: 13:00:00 96 mg via INTRAVENOUS
  Filled 2021-02-01: qty 16

## 2021-02-01 MED ORDER — SODIUM CHLORIDE 0.9 % IV SOLN
10.0000 mg | Freq: Once | INTRAVENOUS | Status: AC
  Administered 2021-02-01: 12:00:00 10 mg via INTRAVENOUS
  Filled 2021-02-01: qty 10

## 2021-02-01 MED ORDER — PALONOSETRON HCL INJECTION 0.25 MG/5ML
0.2500 mg | Freq: Once | INTRAVENOUS | Status: AC
  Administered 2021-02-01: 12:00:00 0.25 mg via INTRAVENOUS
  Filled 2021-02-01: qty 5

## 2021-02-01 MED ORDER — SODIUM CHLORIDE 0.9 % IV SOLN
132.1500 mg | Freq: Once | INTRAVENOUS | Status: AC
  Administered 2021-02-01: 14:00:00 130 mg via INTRAVENOUS
  Filled 2021-02-01: qty 13

## 2021-02-01 MED ORDER — SODIUM CHLORIDE 0.9 % IV SOLN: Freq: Once | INTRAVENOUS | Status: AC

## 2021-02-01 MED ORDER — MEGESTROL ACETATE 625 MG/5ML PO SUSP: 625.0000 mg | Freq: Every day | ORAL | 2 refills | Status: DC

## 2021-02-01 NOTE — Progress Notes (Signed)
Nutrition follow-up completed with patient in the infusion room.   Patient is receiving treatment for esophageal cancer.  Noted weight decreased and documented as 153.3 pounds November 28.  This is decreased from 159 pounds November 21. Noted labs: Glucose 135.  Patient reports he was just given Benadryl and is very drowsy. He reports little to no appetite.  Stated he tried thinning Ensure Plus and it made it taste better.  He still is only consuming 2 cartons a day. Patient was confused when asked if he had nausea. He was unable to clearly answer other questions. Patient declined a snack or Ensure during infusion.  Nutrition diagnosis: Unintentional weight loss continues.  Intervention: Encourage patient to try to increase Ensure Plus and continue thinning with milk. Provided support. Patient's daughter, who is his caregiver, and I have spoken in the past.  There are no new recommendations.  She has my contact information for questions or new concerns.  Monitoring, evaluation, goals: Patient will work to minimize further weight loss.  Next visit: Monday, December 12 during infusion.  **Disclaimer: This note was dictated with voice recognition software. Similar sounding words can inadvertently be transcribed and this note may contain transcription errors which may not have been corrected upon publication of note.**

## 2021-02-01 NOTE — Patient Instructions (Signed)
[facility] MEDICAL [facility]COLOGY   Discharge Instructions: Thank you for choosing [facility] to provide your oncology and hematology care.   If you have a lab appointment with the Cancer Center, please go directly to the Cancer Center and check in at the registration area.   Wear comfortable clothing and clothing appropriate for easy access to any Portacath or PICC line.   We strive to give you quality time with your provider. You may need to reschedule your appointment if you arrive late (15 or more minutes).  Arriving late affects you and other patients whose appointments are after yours.  Also, if you miss three or more appointments without notifying the office, you may be dismissed from the clinic at the provider's discretion.      For prescription refill requests, have your pharmacy contact our office and allow 72 hours for refills to be completed.    Today you received the following chemotherapy and/or immunotherapy agents: paclitaxel and carboplatin.      To help prevent nausea and vomiting after your treatment, we encourage you to take your nausea medication as directed.  BELOW ARE SYMPTOMS THAT SHOULD BE REPORTED IMMEDIATELY: *FEVER GREATER THAN 100.4 F (38 C) OR HIGHER *CHILLS OR SWEATING *NAUSEA AND VOMITING THAT IS NOT CONTROLLED WITH YOUR NAUSEA MEDICATION *UNUSUAL SHORTNESS OF BREATH *UNUSUAL BRUISING OR BLEEDING *URINARY PROBLEMS (pain or burning when urinating, or frequent urination) *BOWEL PROBLEMS (unusual diarrhea, constipation, pain near the anus) TENDERNESS IN MOUTH AND THROAT WITH OR WITHOUT PRESENCE OF ULCERS (sore throat, sores in mouth, or a toothache) UNUSUAL RASH, SWELLING OR PAIN  UNUSUAL VAGINAL DISCHARGE OR ITCHING   Items with * indicate a potential emergency and should be followed up as soon as possible or go to the Emergency Department if any problems should occur.  Please show the CHEMOTHERAPY ALERT CARD or IMMUNOTHERAPY ALERT  CARD at check-in to the Emergency Department and triage nurse.  Should you have questions after your visit or need to cancel or reschedule your appointment, please contact [facility]  Dept: [phone]  and follow the prompts.  Office hours are 8:00 a.m. to 4:30 p.m. Monday - Friday. Please note that voicemails left after 4:00 p.m. may not be returned until the following business day.  We are closed weekends and major holidays. You have access to a nurse at all times for urgent questions. Please call the main number to the clinic Dept: [phone] and follow the prompts.   For any non-urgent questions, you may also contact your provider using MyChart. We now offer e-Visits for anyone 18 and older to request care online for non-urgent symptoms. For details visit [email or web address].   Also download the MyChart app! Go to the app store, search "MyChart", open the app, select [facility], and log in with your MyChart username and password.  Due to Covid, a mask is required upon entering the hospital/clinic. If you do not have a mask, one will be given to you upon arrival. For doctor visits, patients may have 1 support person aged 18 or older with them. For treatment visits, patients cannot have anyone with them due to current Covid guidelines and our immunocompromised population.   

## 2021-02-02 ENCOUNTER — Ambulatory Visit
Admission: RE | Admit: 2021-02-02 | Discharge: 2021-02-02 | Disposition: A | Discharge status: Home / Self Care | Payer: PPO | Source: Ambulatory Visit | Attending: Radiation Oncology | Admitting: Radiation Oncology

## 2021-02-02 DIAGNOSIS — C160 Malignant neoplasm of cardia: Secondary | ICD-10-CM | POA: Diagnosis not present

## 2021-02-02 DIAGNOSIS — Z510 Encounter for antineoplastic radiation therapy: Secondary | ICD-10-CM | POA: Diagnosis not present

## 2021-02-02 LAB — CYTOLOGY - NON PAP

## 2021-02-03 ENCOUNTER — Ambulatory Visit
Admission: RE | Admit: 2021-02-03 | Discharge: 2021-02-03 | Disposition: A | Discharge status: Home / Self Care | Payer: PPO | Source: Ambulatory Visit | Attending: Radiation Oncology | Admitting: Radiation Oncology

## 2021-02-03 ENCOUNTER — Telehealth: Payer: Self-pay

## 2021-02-03 ENCOUNTER — Other Ambulatory Visit: Payer: Self-pay

## 2021-02-03 DIAGNOSIS — C160 Malignant neoplasm of cardia: Secondary | ICD-10-CM | POA: Diagnosis not present

## 2021-02-03 DIAGNOSIS — Z510 Encounter for antineoplastic radiation therapy: Secondary | ICD-10-CM | POA: Diagnosis not present

## 2021-02-03 NOTE — Telephone Encounter (Signed)
Pt's daughter called Alyse Low wanting to speak with Dr. Burr Medico regarding her father's biopsy results of her father's neck.  Pt's daughter would like a return call at [phone].  Informed Dr Burr Medico of pt's daughter's call.

## 2021-02-04 ENCOUNTER — Ambulatory Visit
Admission: RE | Admit: 2021-02-04 | Discharge: 2021-02-04 | Disposition: A | Discharge status: Home / Self Care | Payer: PPO | Source: Ambulatory Visit | Attending: Radiation Oncology | Admitting: Radiation Oncology

## 2021-02-04 DIAGNOSIS — Z510 Encounter for antineoplastic radiation therapy: Secondary | ICD-10-CM | POA: Insufficient documentation

## 2021-02-04 DIAGNOSIS — Z5111 Encounter for antineoplastic chemotherapy: Secondary | ICD-10-CM | POA: Insufficient documentation

## 2021-02-04 DIAGNOSIS — C160 Malignant neoplasm of cardia: Secondary | ICD-10-CM | POA: Insufficient documentation

## 2021-02-05 ENCOUNTER — Other Ambulatory Visit: Payer: Self-pay

## 2021-02-05 ENCOUNTER — Ambulatory Visit
Admission: RE | Admit: 2021-02-05 | Discharge: 2021-02-05 | Disposition: A | Discharge status: Home / Self Care | Payer: PPO | Source: Ambulatory Visit | Attending: Radiation Oncology | Admitting: Radiation Oncology

## 2021-02-05 DIAGNOSIS — C160 Malignant neoplasm of cardia: Secondary | ICD-10-CM | POA: Diagnosis not present

## 2021-02-05 DIAGNOSIS — Z510 Encounter for antineoplastic radiation therapy: Secondary | ICD-10-CM | POA: Diagnosis not present

## 2021-02-05 MED FILL — Dexamethasone Sodium Phosphate Inj 100 MG/10ML: INTRAMUSCULAR | Qty: 1 | Status: AC

## 2021-02-08 ENCOUNTER — Inpatient Hospital Stay: Payer: PPO

## 2021-02-08 ENCOUNTER — Telehealth: Payer: Self-pay

## 2021-02-08 ENCOUNTER — Ambulatory Visit
Admission: RE | Admit: 2021-02-08 | Discharge: 2021-02-08 | Disposition: A | Discharge status: Home / Self Care | Payer: PPO | Source: Ambulatory Visit | Attending: Radiation Oncology | Admitting: Radiation Oncology

## 2021-02-08 ENCOUNTER — Inpatient Hospital Stay (HOSPITAL_BASED_OUTPATIENT_CLINIC_OR_DEPARTMENT_OTHER): Payer: PPO | Admitting: Hematology

## 2021-02-08 ENCOUNTER — Other Ambulatory Visit: Payer: Self-pay

## 2021-02-08 ENCOUNTER — Encounter: Payer: Self-pay | Admitting: Hematology

## 2021-02-08 ENCOUNTER — Inpatient Hospital Stay: Payer: PPO | Admitting: Nutrition

## 2021-02-08 VITALS — BP 130/81 | HR 68 | Temp 98.7°F | Resp 18 | Ht 68.0 in | Wt 158.7 lb

## 2021-02-08 DIAGNOSIS — Z5111 Encounter for antineoplastic chemotherapy: Secondary | ICD-10-CM | POA: Insufficient documentation

## 2021-02-08 DIAGNOSIS — C158 Malignant neoplasm of overlapping sites of esophagus: Secondary | ICD-10-CM

## 2021-02-08 DIAGNOSIS — C155 Malignant neoplasm of lower third of esophagus: Secondary | ICD-10-CM

## 2021-02-08 DIAGNOSIS — C160 Malignant neoplasm of cardia: Secondary | ICD-10-CM | POA: Insufficient documentation

## 2021-02-08 DIAGNOSIS — Z510 Encounter for antineoplastic radiation therapy: Secondary | ICD-10-CM | POA: Diagnosis not present

## 2021-02-08 LAB — CBC WITH DIFFERENTIAL (CANCER CENTER ONLY)
Abs Immature Granulocytes: 0.01 10*3/uL (ref 0.00–0.07)
Basophils Absolute: 0 10*3/uL (ref 0.0–0.1)
Basophils Relative: 1 %
Eosinophils Absolute: 0.1 10*3/uL (ref 0.0–0.5)
Eosinophils Relative: 3 %
HCT: 38 % — ABNORMAL LOW (ref 39.0–52.0)
Hemoglobin: 12.8 g/dL — ABNORMAL LOW (ref 13.0–17.0)
Immature Granulocytes: 1 %
Lymphocytes Relative: 13 %
Lymphs Abs: 0.3 10*3/uL — ABNORMAL LOW (ref 0.7–4.0)
MCH: 29.2 pg (ref 26.0–34.0)
MCHC: 33.7 g/dL (ref 30.0–36.0)
MCV: 86.8 fL (ref 80.0–100.0)
Monocytes Absolute: 0.2 10*3/uL (ref 0.1–1.0)
Monocytes Relative: 11 %
Neutro Abs: 1.5 10*3/uL — ABNORMAL LOW (ref 1.7–7.7)
Neutrophils Relative %: 71 %
Platelet Count: 120 10*3/uL — ABNORMAL LOW (ref 150–400)
RBC: 4.38 MIL/uL (ref 4.22–5.81)
RDW: 13.3 % (ref 11.5–15.5)
WBC Count: 2.1 10*3/uL — ABNORMAL LOW (ref 4.0–10.5)
nRBC: 0 % (ref 0.0–0.2)

## 2021-02-08 LAB — CMP (CANCER CENTER ONLY)
ALT: 19 U/L (ref 0–44)
AST: 13 U/L — ABNORMAL LOW (ref 15–41)
Albumin: 3.4 g/dL — ABNORMAL LOW (ref 3.5–5.0)
Alkaline Phosphatase: 76 U/L (ref 38–126)
Anion gap: 6 (ref 5–15)
BUN: 18 mg/dL (ref 8–23)
CO2: 21 mmol/L — ABNORMAL LOW (ref 22–32)
Calcium: 8.9 mg/dL (ref 8.9–10.3)
Chloride: 112 mmol/L — ABNORMAL HIGH (ref 98–111)
Creatinine: 0.82 mg/dL (ref 0.61–1.24)
GFR, Estimated: >60 mL/min (ref >60)
Glucose, Bld: 116 mg/dL — ABNORMAL HIGH (ref 70–99)
Potassium: 3.7 mmol/L (ref 3.5–5.1)
Sodium: 139 mmol/L (ref 135–145)
Total Bilirubin: 0.6 mg/dL (ref 0.3–1.2)
Total Protein: 5.9 g/dL — ABNORMAL LOW (ref 6.5–8.1)

## 2021-02-08 MED ORDER — PALONOSETRON HCL INJECTION 0.25 MG/5ML
0.2500 mg | Freq: Once | INTRAVENOUS | Status: AC
  Administered 2021-02-08: 0.25 mg via INTRAVENOUS
  Filled 2021-02-08: qty 5

## 2021-02-08 MED ORDER — SODIUM CHLORIDE 0.9 % IV SOLN
10.0000 mg | Freq: Once | INTRAVENOUS | Status: AC
  Administered 2021-02-08: 10 mg via INTRAVENOUS
  Filled 2021-02-08: qty 10

## 2021-02-08 MED ORDER — SODIUM CHLORIDE 0.9 % IV SOLN: Freq: Once | INTRAVENOUS | Status: AC

## 2021-02-08 MED ORDER — SODIUM CHLORIDE 0.9 % IV SOLN
132.1500 mg | Freq: Once | INTRAVENOUS | Status: AC
  Administered 2021-02-08: 130 mg via INTRAVENOUS
  Filled 2021-02-08: qty 13

## 2021-02-08 MED ORDER — DIPHENHYDRAMINE HCL 50 MG/ML IJ SOLN
25.0000 mg | Freq: Once | INTRAMUSCULAR | Status: AC
  Administered 2021-02-08: 25 mg via INTRAVENOUS
  Filled 2021-02-08: qty 1

## 2021-02-08 MED ORDER — SODIUM CHLORIDE 0.9 % IV SOLN
50.0000 mg/m2 | Freq: Once | INTRAVENOUS | Status: AC
  Administered 2021-02-08: 96 mg via INTRAVENOUS
  Filled 2021-02-08: qty 16

## 2021-02-08 MED ORDER — FAMOTIDINE 20 MG IN NS 100 ML IVPB
20.0000 mg | Freq: Once | INTRAVENOUS | Status: AC
  Administered 2021-02-08: 20 mg via INTRAVENOUS
  Filled 2021-02-08: qty 100

## 2021-02-08 NOTE — Progress Notes (Signed)
I spoke with patient's daughter on the telephone.  She provided nutrition update. Patient is receiving treatment for esophageal cancer.  Weight improved and documented as 158.7 pounds today increased from 153.3 pounds November 28. Patient has an improved appetite after starting Megace ES. His daughter reports that he drinks at least 2 Ensure Plus a day.  She has been thinning with regular milk as I suggested which has improved his tolerance. There are no other nutrition concerns voiced.  Nutrition diagnosis: Unintentional weight loss improved.  Intervention: Educated to continue strategies for regular meals with snacks as tolerated. Continue Ensure Plus twice daily.  Use regular milk to improve taste. Encouraged patient's daughter to contact me as needed.  Monitoring, evaluation, goals: Patient will work to tolerate adequate calories and protein to promote weight maintenance/weight gain.  Next visit: Monday, December 12 during infusion.  **Disclaimer: This note was dictated with voice recognition software. Similar sounding words can inadvertently be transcribed and this note may contain transcription errors which may not have been corrected upon publication of note.**

## 2021-02-08 NOTE — Telephone Encounter (Signed)
Pt's daughter called stating the pt has an ingrown toenail that the pt's PCP (who manages his Diabetes) would like to remove.  Pt's daughter wants to know if it is OK for the ingrown toenail to be removed since the pt is immunosuppressed.

## 2021-02-08 NOTE — Patient Instructions (Signed)
[facility]   Discharge Instructions: Thank you for choosing [facility] to provide your oncology and hematology care.   If you have a lab appointment with the Cancer Center, please go directly to the Cancer Center and check in at the registration area.   Wear comfortable clothing and clothing appropriate for easy access to any Portacath or PICC line.   We strive to give you quality time with your provider. You may need to reschedule your appointment if you arrive late (15 or more minutes).  Arriving late affects you and other patients whose appointments are after yours.  Also, if you miss three or more appointments without notifying the office, you may be dismissed from the clinic at the provider's discretion.      For prescription refill requests, have your pharmacy contact our office and allow 72 hours for refills to be completed.    Today you received the following chemotherapy and/or immunotherapy agents: paclitaxel and carboplatin.      To help prevent nausea and vomiting after your treatment, we encourage you to take your nausea medication as directed.  BELOW ARE SYMPTOMS THAT SHOULD BE REPORTED IMMEDIATELY: *FEVER GREATER THAN 100.4 F (38 C) OR HIGHER *CHILLS OR SWEATING *NAUSEA AND VOMITING THAT IS NOT CONTROLLED WITH YOUR NAUSEA MEDICATION *UNUSUAL SHORTNESS OF BREATH *UNUSUAL BRUISING OR BLEEDING *URINARY PROBLEMS (pain or burning when urinating, or frequent urination) *BOWEL PROBLEMS (unusual diarrhea, constipation, pain near the anus) TENDERNESS IN MOUTH AND THROAT WITH OR WITHOUT PRESENCE OF ULCERS (sore throat, sores in mouth, or a toothache) UNUSUAL RASH, SWELLING OR PAIN  UNUSUAL VAGINAL DISCHARGE OR ITCHING   Items with * indicate a potential emergency and should be followed up as soon as possible or go to the Emergency Department if any problems should occur.  Please show the CHEMOTHERAPY ALERT CARD or IMMUNOTHERAPY ALERT  CARD at check-in to the Emergency Department and triage nurse.  Should you have questions after your visit or need to cancel or reschedule your appointment, please contact [facility]  Dept: [phone]  and follow the prompts.  Office hours are 8:00 a.m. to 4:30 p.m. Monday - Friday. Please note that voicemails left after 4:00 p.m. may not be returned until the following business day.  We are closed weekends and major holidays. You have access to a nurse at all times for urgent questions. Please call the main number to the clinic Dept: [phone] and follow the prompts.   For any non-urgent questions, you may also contact your provider using MyChart. We now offer e-Visits for anyone 18 and older to request care online for non-urgent symptoms. For details visit [email or web address].   Also download the MyChart app! Go to the app store, search "MyChart", open the app, select [place], and log in with your MyChart username and password.  Due to Covid, a mask is required upon entering the hospital/clinic. If you do not have a mask, one will be given to you upon arrival. For doctor visits, patients may have 1 support person aged 18 or older with them. For treatment visits, patients cannot have anyone with them due to current Covid guidelines and our immunocompromised population.   

## 2021-02-08 NOTE — Progress Notes (Signed)
Jeffrey [name]   Telephone:(336) [phone] Fax:(336) [phone]   Clinic Follow up Note   Patient Care Team: [name], MD as PCP - General (Family Medicine) Jerline Pain, MD as PCP - Cardiology (Cardiology) Delia Chimes, NP (Inactive) as Nurse Practitioner (Nurse Practitioner) Ladene Artist, MD as Consulting Physician (Gastroenterology) Alla Feeling, NP as Nurse Practitioner (Nurse Practitioner) Truitt Merle, MD as Consulting Physician (Hematology) Truitt Merle, MD as Consulting Physician (Oncology)  Date of Service:  02/08/2021  CHIEF COMPLAINT: f/u of esophageal cancer  CURRENT THERAPY:  Concurrent chemoRT with weekly carbo/taxol, starting 01/18/21  ASSESSMENT & PLAN:  Jeffrey [name] is a 80 y.o. male with   1. Adenocarcinoma of the GE junction, cTX N1 M0, HER2 negative, MMR normal, PD-L1 CPS 3% -He presented with 6-8 months of dysphagia and 10 pounds weight loss and a palpable right cervical lymph node. Initial CT neck 10/22/20 showed 2.8 cm LN, concerning for metastatic disease.  -CT 12/08/20 showed suspicious mass at GE junction and prominent gastrohepatic LNs. EGD 12/17/20 by Dr. Fuller Plan showed 5 cm distal esophageal mass, biopsy confirmed adenocarcinoma with signet ring features. HER 2 negative -PET scan 12/30/20 showed FDG-avid mass at the GE junction and mild uptake with borderline gastrohepatic ligament lymph nodes, no distant metastasis. -He began concurrent chemo RT with weekly carbo/Taxol 01/18/2021, C1 carbo was dose reduced to AUC 1.5 for age and co-morbidities. He tolerated moderately well with fatigue, decreased appetite/taste change, nausea, and constipation. -cervical lymph node biopsy on 01/29/21 showed atypical cells. I will reach out to his ENT, Dr. Fredric Dine, at [facility] for discussion of excisional biopsy.   2. Symptom Management: Weight loss, Decreased appetite/Taste change -Continue follow-up with dietitian -started on megace 02/01/21, which  has helped. He is eating "anything he wants."   3. Goals of care -He understands if work-up confirms metastatic disease, it will not be curable but still treatable, treatment will be palliative   4. Comorbidities: CVA with residual right-sided weakness, history of MI, CAD, HTN, DM, HL -Patient lives alone, his daughter checks on him frequently to help with housework and food prep -He is limited in his ADLs due to residual right-sided weakness from CVA -He ambulates with support, can bathe and dress with difficulty   PLAN: -proceed with week 4 carbo/taxol at same dose  -continue daily radiation -lab and f/u weekly as scheduled for next two weeks    No problem-specific Assessment & Plan notes found for this encounter.   SUMMARY OF ONCOLOGIC HISTORY: Oncology History Overview Note  Cancer Staging Esophageal cancer [facility]) Staging form: Esophagus - Adenocarcinoma, AJCC 8th Edition - Clinical stage from 12/31/2020: Stage IIA (cT1, cN1, cM0, GX) - Unsigned    Esophageal cancer ([place])  10/22/2020 Imaging   CT neck IMPRESSION: 2.8 cm right level 2A lymph node, which appears to be centrally necrotic, concerning for metastatic disease. No additional lymphadenopathy or other acute process in the neck.     11/30/2020 Imaging   Barium swallow FINDINGS: Limitations on positioning due to sequelae of stroke. The patient swallowed barium without difficulty. No aspiration. There is no mass or stricture. Normal motility. There is no hiatal hernia. No spontaneous gastroesophageal reflux. Barium pill was swallowed and passed without difficulty.   IMPRESSION: No significant abnormality.   12/08/2020 Imaging   CT chest abdomen with contrast IMPRESSION: 1. Mass is suspected just at the level of the GE junction. Suspicious for primary esophageal neoplasm. Further evaluation with endoscopy is advised. 2. Prominent perigastric  lymph nodes are identified, suspicious for metastatic adenopathy. 3.  Gallstone. 4. Coronary artery calcifications. 5. Aortic Atherosclerosis (ICD10-I70.0) and Emphysema (ICD10-J43.9). 6. These results will be called to the ordering clinician or representative by the Radiologist Assistant, and communication documented in the PACS or [employer].     12/17/2020 Procedure   EGD by Dr. Fuller Plan - A medium-sized, fungating mass with contact bleeding was found in the distal esophagus, 36 cm from the incisors extending to 41 cm in the cardia. The mass was partially obstructing and circumferential.   12/17/2020 Initial Biopsy   Diagnosis Esophagus, biopsy, distal and gastric cardia mass - ADENOCARCINOMA WITH SIGNET RING FEATURES.   12/19/2020 Initial Diagnosis   Esophageal cancer ([place])   12/30/2020 PET scan   IMPRESSION: 1. Mass at the level of the GE junction and proximal stomach is FDG avid within SUV max of 7.62. Findings compatible with primary gastric neoplasm. 2. There is mild FDG uptake associated with borderline gastrohepatic ligament lymph nodes which have an SUV max of 2.97. Equivocal for nodal metastasis. 3. No signs of distant metastatic disease. 4. Aortic Atherosclerosis (ICD10-I70.0) and Emphysema (ICD10-J43.9). Coronary artery calcifications. 5. Gallstones. 6. Prostate gland enlargement.   01/08/2021 Imaging   EXAM: ULTRASOUND OF HEAD/NECK SOFT TISSUES  IMPRESSION: Sonographic evaluation of the right-side of the neck confirms a minimally complex cystic lesion with broad differential considerations including cystic/necrotic cervical lymphadenopathy (favored given presence of an additional smaller apparently partially cystic right cervical lymph node), compatible with the findings on preceding PET-CT.   This minimally complex cystic structure/mass could undergo ultrasound-guided biopsy/aspiration as indicated.   01/18/2021 -  Chemotherapy   Patient is on Treatment Plan : ESOPHAGUS Carboplatin/PACLitaxel weekly x 6 weeks with XRT        01/29/2021 Pathology Results   A. RIGHT, SUBMANDIBULAR CYST, FINE NEEDLE  ASPIRATION:   FINAL MICROSCOPIC DIAGNOSIS:  - Atypical cells present DIAGNOSTIC COMMENTS:  There are clusters of epithelioid cells with nuclear grooves and  pseudoinclusions admixed with pigmented histiocytes.  There is  insufficient material for ancillary studies.  Excisional biopsy may be  of interest.      INTERVAL HISTORY:  STRYKER VEASEY is here for a follow up of esophageal cancer. He was last seen by NP Lacie on 02/01/21. He presents to the clinic accompanied by his daughter. He reports he is doing well overall. They note the megace is helping his appetite   All other systems were reviewed with the patient and are negative.  MEDICAL HISTORY:  Past Medical History:  Diagnosis Date   Anterior cerebral circulation hemorrhagic infarction [facility]) 11/24/2014   March, 2016, dominant left thalamic and left internal capsule ischemic infarct with resultant hemorrhagic transformation, secondary to small vessel disease. Resulted in right hemiparesis dysarthria and diplopia   //   readmission with aphasia July, 2016, this improved    Arthritis    CAD (coronary artery disease)    PCI distal RCA ...2004, residual 70% LAD   /   ...nuclear...03/2007...no ischemia.Marland KitchenMarland Kitchenpreserved LV /  nuclear...03/03/2009...inferior scar..no ischemia..EF 51%   Cyst of nasopharynx    per ENT Wilburn Cornelia   Dyslipidemia    takes Atorvastatin daily   GERD (gastroesophageal reflux disease)    takes Omeprazole daily   Gout    takes Allopurinol daily and Colchicine as needed   HCAP (healthcare-associated pneumonia)    Hemiparesis affecting right side as late effect of cerebrovascular accident ([place]) 09/05/2014   History of colon polyps    HTN (hypertension)  takes Cardura,Metoprolol,[name],and Amlodipine daily   Internal hemorrhoids    Myocardial infarction [facility]) 12yr ago   Peripheral edema    takes Lasix daily   Pharyngeal or  nasopharyngeal cyst 05/2013   with chronic hoarseness s/p excision   Right carotid bruit    Seasonal allergic rhinitis    Stroke ([facility]    Type 2 diabetes, uncontrolled, with neuropathy 1989   takes Invokana daily and has an insulin pump ([name]    SURGICAL HISTORY: Past Surgical History:  Procedure Laterality Date   BALLOON ANGIOPLASTY, ARTERY  1992, 2004   CAD, Dr. KRon Parker  CARDIAC CATHETERIZATION  2004   CATARACT EXTRACTION Bilateral 2010   COLONOSCOPY  04/03/2002   adenomatous polyp, int hemorrhoids   COLONOSCOPY  07/18/2007   normal (Dr. SFuller Plan   COLONOSCOPY  09/26/2012   tubular adenoma, sm int hem, rpt 5 yrs (Fuller Plan   ELBOW SURGERY Right 1997   eye lids raised     NASAL SEPTUM SURGERY     POLYPECTOMY N/A 05/27/2013   Procedure: ENDOSCOPIC NASOPHARYNGEAL MASS;  Surgeon: DJerrell Belfast MD   ROTATOR CUFF REPAIR  09/2011   left, with subacromial decompression   TONSILLECTOMY AND ADENOIDECTOMY      I have reviewed the social history and family history with the patient and they are unchanged from previous note.  ALLERGIES:  is allergic to niacin.  MEDICATIONS:  Current Outpatient Medications  Medication Sig Dispense Refill   atorvastatin (LIPITOR) 80 MG tablet Take 80 mg by mouth at bedtime.     baclofen (LIORESAL) 10 MG tablet Take 10 mg by mouth 3 (three) times daily.     cholecalciferol (VITAMIN D3) 25 MCG (1000 UNIT) tablet Take 1,000 Units by mouth daily.     clopidogrel (PLAVIX) 75 MG tablet Take 1 tablet (75 mg total) by mouth daily.     empagliflozin (JARDIANCE) 25 MG TABS tablet Take 25 mg by mouth daily.     fosinopril ([name]) 40 MG tablet Take 1 tablet (40 mg total) by mouth daily.     furosemide (LASIX) 20 MG tablet Take 20-40 mg by mouth See admin instructions. Take 20 mg on Mon, Wed, Fri, and Sun. Take 40 mg on Tues, Thurs, and Sat     insulin aspart (NOVOLOG FLEXPEN) 100 UNIT/ML FlexPen Inject 10 Units into the skin 2 (two) times daily with a meal.  Breakfast and dinner     insulin glargine (LANTUS) 100 UNIT/ML Solostar Pen Inject 24 Units into the skin at bedtime.     LACTOBACILLUS PO Take 1 capsule by mouth every Monday, Wednesday, and Friday.     loratadine (CLARITIN) 10 MG tablet Take 10 mg by mouth.     megestrol (MEGACE ES) 625 MG/5ML suspension Take 5 mLs (625 mg total) by mouth daily. 150 mL 2   metoprolol tartrate (LOPRESSOR) 25 MG tablet Take 1 tablet (25 mg total) by mouth 2 (two) times daily.     mupirocin cream (BACTROBAN) 2 % Apply 1 application topically 2 (two) times daily. (Patient not taking: Reported on 01/26/2021) 15 g 0   Olopatadine HCl (PATADAY OP) Place 1 drop into both eyes daily as needed (allergies).     ondansetron (ZOFRAN) 8 MG tablet Take 1 tablet (8 mg total) by mouth 2 (two) times daily as needed for refractory nausea / vomiting. Start on day 3 after chemo. 30 tablet 1   pantoprazole (PROTONIX) 20 MG tablet TAKE 1 TABLET BY MOUTH EVERY DAY 30 tablet  3   Potassium 99 MG TABS Take 1 tablet (99 mg total) by mouth daily as needed (when you take 2 lasix pills). (Patient taking differently: Take 1 tablet by mouth 3 (three) times a week. Tues, Thurs, and Sat)     prochlorperazine (COMPAZINE) 10 MG tablet Take 1 tablet (10 mg total) by mouth every 6 (six) hours as needed (Nausea or vomiting). 30 tablet 1   sertraline (ZOLOFT) 25 MG tablet Take 1 tablet (25 mg total) by mouth daily.     No current facility-administered medications for this visit.    PHYSICAL EXAMINATION: ECOG PERFORMANCE STATUS: 2 - Symptomatic, <50% confined to bed  Vitals:   02/08/21 1004  BP: 130/81  Pulse: 68  Resp: 18  Temp: 98.7 F (37.1 C)  SpO2: 100%   Wt Readings from Last 3 Encounters:  02/08/21 158 lb 11.2 oz (72 kg)  02/01/21 153 lb 4.8 oz (69.5 kg)  01/29/21 158 lb (71.7 kg)     GENERAL:alert, no distress and comfortable SKIN: skin color normal, no rashes or significant lesions EYES: normal, Conjunctiva are pink and  non-injected, sclera clear  NEURO: alert & oriented x 3 with fluent speech  LABORATORY DATA:  I have reviewed the data as listed CBC Latest Ref Rng & Units 02/08/2021 02/01/2021 01/25/2021  WBC 4.0 - 10.5 K/uL 2.1(L) 2.9(L) 5.0  Hemoglobin 13.0 - 17.0 g/dL 12.8(L) 14.1 13.5  Hematocrit 39.0 - 52.0 % 38.0(L) 42.2 40.0  Platelets 150 - 400 K/uL 120(L) 129(L) 136(L)     CMP Latest Ref Rng & Units 02/08/2021 02/01/2021 01/25/2021  Glucose 70 - 99 mg/dL 116(H) 135(H) 91  BUN 8 - 23 mg/dL _0 Creatinine 0.61 - 1.24 mg/dL 0.82 0.88 0.77  Sodium 135 - 145 mmol/L 139 142 141  Potassium 3.5 - 5.1 mmol/L 3.7 3.7 3.9  Chloride 98 - 111 mmol/L 112(H) 106 110  CO2 22 - 32 mmol/L 21(L) 27 25  Calcium 8.9 - 10.3 mg/dL 8.9 9.2 8.8(L)  Total Protein 6.5 - 8.1 g/dL 5.9(L) 6.4(L) 6.3(L)  Total Bilirubin 0.3 - 1.2 mg/dL 0.6 0.7 0.9  Alkaline Phos 38 - 126 U/L 76 98 98  AST 15 - 41 U/L 13(L) 16 19  ALT 0 - 44 U/L _1 RADIOGRAPHIC STUDIES: I have personally reviewed the radiological images as listed and agreed with the findings in the report. No results found.    No orders of the defined types were placed in this encounter.  All questions were answered. The patient knows to call the clinic with any problems, questions or concerns. No barriers to learning was detected. The total time spent in the appointment was 30 minutes.     Truitt Merle, MD 02/08/2021   I, Wilburn Mylar, am acting as scribe for Truitt Merle, MD.   I have reviewed the above documentation for accuracy and completeness, and I agree with the above.

## 2021-02-09 ENCOUNTER — Telehealth: Payer: Self-pay

## 2021-02-09 ENCOUNTER — Ambulatory Visit
Admission: RE | Admit: 2021-02-09 | Discharge: 2021-02-09 | Disposition: A | Discharge status: Home / Self Care | Payer: PPO | Source: Ambulatory Visit | Attending: Radiation Oncology | Admitting: Radiation Oncology

## 2021-02-09 DIAGNOSIS — C160 Malignant neoplasm of cardia: Secondary | ICD-10-CM | POA: Diagnosis not present

## 2021-02-09 DIAGNOSIS — Z510 Encounter for antineoplastic radiation therapy: Secondary | ICD-10-CM | POA: Diagnosis not present

## 2021-02-09 NOTE — Telephone Encounter (Signed)
Spoke with Alyse Low the pt's daughter via telephone regarding her question is the pt "OK" to allow the PCP to remove an ingrown toenail.  Informed pt's daughter that Dr. Burr Medico cleared the pt to have the ingrown toenail removed by his PCP.  Pt's daughter had no further questions or concerns at this time.

## 2021-02-09 NOTE — Progress Notes (Signed)
I left vm for Dr Slotnicki's CMA Lattie Haw to ask her to call Yf on her cell phone, number provided.

## 2021-02-10 ENCOUNTER — Other Ambulatory Visit: Payer: Self-pay

## 2021-02-10 ENCOUNTER — Encounter: Payer: Self-pay | Admitting: Podiatrist

## 2021-02-10 ENCOUNTER — Ambulatory Visit
Admission: RE | Admit: 2021-02-10 | Discharge: 2021-02-10 | Disposition: A | Discharge status: Home / Self Care | Payer: PPO | Source: Ambulatory Visit | Attending: Radiation Oncology | Admitting: Radiation Oncology

## 2021-02-10 ENCOUNTER — Ambulatory Visit: Payer: PPO | Admitting: Podiatrist

## 2021-02-10 DIAGNOSIS — M79674 Pain in right toe(s): Secondary | ICD-10-CM

## 2021-02-10 DIAGNOSIS — E1142 Type 2 diabetes mellitus with diabetic polyneuropathy: Secondary | ICD-10-CM

## 2021-02-10 DIAGNOSIS — B351 Tinea unguium: Secondary | ICD-10-CM

## 2021-02-10 DIAGNOSIS — Z510 Encounter for antineoplastic radiation therapy: Secondary | ICD-10-CM | POA: Diagnosis not present

## 2021-02-10 DIAGNOSIS — M79675 Pain in left toe(s): Secondary | ICD-10-CM | POA: Diagnosis not present

## 2021-02-10 DIAGNOSIS — C160 Malignant neoplasm of cardia: Secondary | ICD-10-CM | POA: Diagnosis not present

## 2021-02-10 NOTE — Patient Instructions (Addendum)
Apply antibiotic ointment or spray to the corner of the left great toe for the next few days  If you notice any redness, swelling or drainage or pain, please call and let us know!!

## 2021-02-10 NOTE — Progress Notes (Signed)
80 y.o. male presents with at risk foot care with history of diabetic neuropathy and painful thick toenails that are difficult to trim. Pain interferes with ambulation. Aggravating factors include wearing enclosed shoe gear. Pain is relieved with periodic professional debridement.   He has h/o CVA with right sided hemiparesis. He uses wheelchair for mobility.   He is also currently undergoing chemotherapy and radiation treatments.    Jeffrey [name] is accompanied by his daughter on today's visit. He states his left great toenail grows into the side of the skin and is little tender. Denies any redness, drainage or swelling.   PCP: [name], MD    Review of Systems: Negative except as noted in the HPI.         Allergies  Allergen Reactions   Niacin Other (See Comments)      REACTION: intolerant,not allergic="flushing,hot flashes,turning red" REACTION: intolerant,not allergic="flushing,hot flashes,turning red" Other reaction(s): flushed skin/rash Other reaction(s): flushed skin/rash      Objective:  There were no vitals filed for this visit. Constitutional Patient is a pleasant 80 y.o. Caucasian male WD, WN in NAD. AAO x 3.  Vascular Capillary fill time to digits <3 seconds b/l lower extremities. Faintly palpable DP pulse(s) b/l lower extremities. Nonpalpable PT pulse(s) b/l lower extremities. Pedal hair absent. Lower extremity skin temperature gradient within normal limits. No pain with calf compression b/l. Trace edema BLE. No cyanosis or clubbing noted.  Neurologic Normal speech. Protective sensation intact 5/5 intact bilaterally with 10g monofilament b/l. Vibratory sensation intact b/l.  Dermatologic Pedal skin is thin shiny, atrophic b/l lower extremities. No open wounds b/l lower extremities. No interdigital macerations b/l lower extremities. Toenails 1-5 b/l elongated, discolored, dystrophic, thickened, crumbly with subungual debris and tenderness to dorsal palpation.Incurvated  nailplate left great toe lateral border with tenderness to palpation. No erythema, no edema, no drainage noted.   Orthopedic: Normal muscle strength 5/5 to all lower extremity muscle groups LLE.  Hemiparesis of RLE. Utilizes wheelchair for mobility.     Assessment:    1. Pain due to onychomycosis of toenails of both feet   2. DM type 2 with diabetic peripheral neuropathy ([place])     Plan:  Patient was evaluated and treated and all questions answered. Consent given for treatment as described below: -Examined patient. -Continue diabetic foot care principles: inspect feet daily, monitor glucose as recommended by PCP. -Patient to continue soft, supportive shoe gear daily. -Toenails 1-5 b/l were debrided in length and girth with sterile nail nippers and dremel without iatrogenic bleeding.  -Offending nail border debrided and curretaged L hallux utilizing sterile nail nipper and currette. Border(s) cleansed with alcohol and triple antibiotic ointment applied. Patient instructed to apply Neosporin to L hallux once daily for 7 days. -Patient to report any pedal injuries to medical professional immediately. -Patient/POA to call should there be question/concern in the interim. - He will be seen in 3 months for follow up with Dr. Elisha Ponder

## 2021-02-11 ENCOUNTER — Ambulatory Visit
Admission: RE | Admit: 2021-02-11 | Discharge: 2021-02-11 | Disposition: A | Discharge status: Home / Self Care | Payer: PPO | Source: Ambulatory Visit | Attending: Radiation Oncology | Admitting: Radiation Oncology

## 2021-02-11 DIAGNOSIS — C160 Malignant neoplasm of cardia: Secondary | ICD-10-CM | POA: Diagnosis not present

## 2021-02-11 DIAGNOSIS — Z510 Encounter for antineoplastic radiation therapy: Secondary | ICD-10-CM | POA: Diagnosis not present

## 2021-02-12 ENCOUNTER — Other Ambulatory Visit: Payer: Self-pay

## 2021-02-12 ENCOUNTER — Ambulatory Visit
Admission: RE | Admit: 2021-02-12 | Discharge: 2021-02-12 | Disposition: A | Discharge status: Home / Self Care | Payer: PPO | Source: Ambulatory Visit | Attending: Radiation Oncology | Admitting: Radiation Oncology

## 2021-02-12 DIAGNOSIS — C160 Malignant neoplasm of cardia: Secondary | ICD-10-CM | POA: Diagnosis not present

## 2021-02-12 DIAGNOSIS — Z510 Encounter for antineoplastic radiation therapy: Secondary | ICD-10-CM | POA: Diagnosis not present

## 2021-02-12 MED FILL — Dexamethasone Sodium Phosphate Inj 100 MG/10ML: INTRAMUSCULAR | Qty: 1 | Status: AC

## 2021-02-15 ENCOUNTER — Encounter: Payer: Self-pay | Admitting: Hematology

## 2021-02-15 ENCOUNTER — Inpatient Hospital Stay: Payer: PPO | Admitting: Nutrition

## 2021-02-15 ENCOUNTER — Inpatient Hospital Stay (HOSPITAL_BASED_OUTPATIENT_CLINIC_OR_DEPARTMENT_OTHER): Payer: PPO | Admitting: Hematology

## 2021-02-15 ENCOUNTER — Inpatient Hospital Stay: Payer: PPO

## 2021-02-15 ENCOUNTER — Other Ambulatory Visit: Payer: Self-pay

## 2021-02-15 ENCOUNTER — Ambulatory Visit
Admission: RE | Admit: 2021-02-15 | Discharge: 2021-02-15 | Disposition: A | Discharge status: Home / Self Care | Payer: PPO | Source: Ambulatory Visit | Attending: Radiation Oncology | Admitting: Radiation Oncology

## 2021-02-15 VITALS — BP 108/67 | HR 64 | Temp 98.4°F | Resp 16 | Ht 68.0 in | Wt 152.3 lb

## 2021-02-15 DIAGNOSIS — C160 Malignant neoplasm of cardia: Secondary | ICD-10-CM | POA: Diagnosis not present

## 2021-02-15 DIAGNOSIS — C158 Malignant neoplasm of overlapping sites of esophagus: Secondary | ICD-10-CM

## 2021-02-15 DIAGNOSIS — C155 Malignant neoplasm of lower third of esophagus: Secondary | ICD-10-CM

## 2021-02-15 DIAGNOSIS — Z510 Encounter for antineoplastic radiation therapy: Secondary | ICD-10-CM | POA: Diagnosis not present

## 2021-02-15 LAB — CMP (CANCER CENTER ONLY)
ALT: 28 U/L (ref 0–44)
AST: 18 U/L (ref 15–41)
Albumin: 3.4 g/dL — ABNORMAL LOW (ref 3.5–5.0)
Alkaline Phosphatase: 68 U/L (ref 38–126)
Anion gap: 9 (ref 5–15)
BUN: 17 mg/dL (ref 8–23)
CO2: 21 mmol/L — ABNORMAL LOW (ref 22–32)
Calcium: 8.4 mg/dL — ABNORMAL LOW (ref 8.9–10.3)
Chloride: 113 mmol/L — ABNORMAL HIGH (ref 98–111)
Creatinine: 0.81 mg/dL (ref 0.61–1.24)
GFR, Estimated: >60 mL/min (ref >60)
Glucose, Bld: 62 mg/dL — ABNORMAL LOW (ref 70–99)
Potassium: 3.1 mmol/L — ABNORMAL LOW (ref 3.5–5.1)
Sodium: 143 mmol/L (ref 135–145)
Total Bilirubin: 0.7 mg/dL (ref 0.3–1.2)
Total Protein: 5.9 g/dL — ABNORMAL LOW (ref 6.5–8.1)

## 2021-02-15 LAB — CBC WITH DIFFERENTIAL (CANCER CENTER ONLY)
Abs Immature Granulocytes: 0 10*3/uL (ref 0.00–0.07)
Basophils Absolute: 0 10*3/uL (ref 0.0–0.1)
Basophils Relative: 1 %
Eosinophils Absolute: 0 10*3/uL (ref 0.0–0.5)
Eosinophils Relative: 1 %
HCT: 37.8 % — ABNORMAL LOW (ref 39.0–52.0)
Hemoglobin: 12.8 g/dL — ABNORMAL LOW (ref 13.0–17.0)
Immature Granulocytes: 0 %
Lymphocytes Relative: 8 %
Lymphs Abs: 0.2 10*3/uL — ABNORMAL LOW (ref 0.7–4.0)
MCH: 29.6 pg (ref 26.0–34.0)
MCHC: 33.9 g/dL (ref 30.0–36.0)
MCV: 87.3 fL (ref 80.0–100.0)
Monocytes Absolute: 0.3 10*3/uL (ref 0.1–1.0)
Monocytes Relative: 11 %
Neutro Abs: 1.9 10*3/uL (ref 1.7–7.7)
Neutrophils Relative %: 79 %
Platelet Count: 135 10*3/uL — ABNORMAL LOW (ref 150–400)
RBC: 4.33 MIL/uL (ref 4.22–5.81)
RDW: 13.9 % (ref 11.5–15.5)
WBC Count: 2.3 10*3/uL — ABNORMAL LOW (ref 4.0–10.5)
nRBC: 0 % (ref 0.0–0.2)

## 2021-02-15 LAB — CEA (IN HOUSE-CHCC): CEA (CHCC-In House): 2.04 ng/mL (ref 0.00–5.00)

## 2021-02-15 MED ORDER — SODIUM CHLORIDE 0.9 % IV SOLN
50.0000 mg/m2 | Freq: Once | INTRAVENOUS | Status: AC
  Administered 2021-02-15: 96 mg via INTRAVENOUS
  Filled 2021-02-15: qty 16

## 2021-02-15 MED ORDER — SODIUM CHLORIDE 0.9 % IV SOLN: Freq: Once | INTRAVENOUS | Status: AC

## 2021-02-15 MED ORDER — SODIUM CHLORIDE 0.9 % IV SOLN
10.0000 mg | Freq: Once | INTRAVENOUS | Status: AC
  Administered 2021-02-15: 10 mg via INTRAVENOUS
  Filled 2021-02-15: qty 10

## 2021-02-15 MED ORDER — DIPHENHYDRAMINE HCL 50 MG/ML IJ SOLN
25.0000 mg | Freq: Once | INTRAMUSCULAR | Status: AC
  Administered 2021-02-15: 25 mg via INTRAVENOUS
  Filled 2021-02-15: qty 1

## 2021-02-15 MED ORDER — FAMOTIDINE 20 MG IN NS 100 ML IVPB
20.0000 mg | Freq: Once | INTRAVENOUS | Status: AC
  Administered 2021-02-15: 20 mg via INTRAVENOUS
  Filled 2021-02-15: qty 100

## 2021-02-15 MED ORDER — SODIUM CHLORIDE 0.9 % IV SOLN
132.1500 mg | Freq: Once | INTRAVENOUS | Status: AC
  Administered 2021-02-15: 130 mg via INTRAVENOUS
  Filled 2021-02-15: qty 13

## 2021-02-15 MED ORDER — PALONOSETRON HCL INJECTION 0.25 MG/5ML
0.2500 mg | Freq: Once | INTRAVENOUS | Status: AC
  Administered 2021-02-15: 0.25 mg via INTRAVENOUS
  Filled 2021-02-15: qty 5

## 2021-02-15 NOTE — Patient Instructions (Signed)
[facility]  Discharge Instructions: Thank you for choosing [facility] to provide your oncology and hematology care.   If you have a lab appointment with the Cancer Center, please go directly to the Cancer Center and check in at the registration area.   Wear comfortable clothing and clothing appropriate for easy access to any Portacath or PICC line.   We strive to give you quality time with your provider. You may need to reschedule your appointment if you arrive late (15 or more minutes).  Arriving late affects you and other patients whose appointments are after yours.  Also, if you miss three or more appointments without notifying the office, you may be dismissed from the clinic at the provider's discretion.      For prescription refill requests, have your pharmacy contact our office and allow 72 hours for refills to be completed.    Today you received the following chemotherapy and/or immunotherapy agents Taxol and Carboplatin       To help prevent nausea and vomiting after your treatment, we encourage you to take your nausea medication as directed.  BELOW ARE SYMPTOMS THAT SHOULD BE REPORTED IMMEDIATELY: *FEVER GREATER THAN 100.4 F (38 C) OR HIGHER *CHILLS OR SWEATING *NAUSEA AND VOMITING THAT IS NOT CONTROLLED WITH YOUR NAUSEA MEDICATION *UNUSUAL SHORTNESS OF BREATH *UNUSUAL BRUISING OR BLEEDING *URINARY PROBLEMS (pain or burning when urinating, or frequent urination) *BOWEL PROBLEMS (unusual diarrhea, constipation, pain near the anus) TENDERNESS IN MOUTH AND THROAT WITH OR WITHOUT PRESENCE OF ULCERS (sore throat, sores in mouth, or a toothache) UNUSUAL RASH, SWELLING OR PAIN  UNUSUAL VAGINAL DISCHARGE OR ITCHING   Items with * indicate a potential emergency and should be followed up as soon as possible or go to the Emergency Department if any problems should occur.  Please show the CHEMOTHERAPY ALERT CARD or IMMUNOTHERAPY ALERT CARD at  check-in to the Emergency Department and triage nurse.  Should you have questions after your visit or need to cancel or reschedule your appointment, please contact [facility]  Dept: [phone]  and follow the prompts.  Office hours are 8:00 a.m. to 4:30 p.m. Monday - Friday. Please note that voicemails left after 4:00 p.m. may not be returned until the following business day.  We are closed weekends and major holidays. You have access to a nurse at all times for urgent questions. Please call the main number to the clinic Dept: [phone] and follow the prompts.   For any non-urgent questions, you may also contact your provider using MyChart. We now offer e-Visits for anyone 18 and older to request care online for non-urgent symptoms. For details visit [email or web address].   Also download the MyChart app! Go to the app store, search "MyChart", open the app, select [facility], and log in with your MyChart username and password.  Due to Covid, a mask is required upon entering the hospital/clinic. If you do not have a mask, one will be given to you upon arrival. For doctor visits, patients may have 1 support person aged 18 or older with them. For treatment visits, patients cannot have anyone with them due to current Covid guidelines and our immunocompromised population.   

## 2021-02-15 NOTE — Progress Notes (Signed)
[name]   [phone] [phone]   Clinic Follow up Note   Patient Care Team: [name], MD as PCP - General (Family Medicine) Jerline Pain, MD as PCP - Cardiology (Cardiology) Delia Chimes, NP (Inactive) as Nurse Practitioner (Nurse Practitioner) Ladene Artist, MD as Consulting Physician (Gastroenterology) Alla Feeling, NP as Nurse Practitioner (Nurse Practitioner) Truitt Merle, MD as Consulting Physician (Hematology) Truitt Merle, MD as Consulting Physician (Oncology)  Date of Service:  02/15/2021  CHIEF COMPLAINT: f/u of esophageal cancer  CURRENT THERAPY:  Concurrent chemoRT with weekly carbo/taxol, starting 01/18/21  ASSESSMENT & PLAN:  Jeffrey [name] is a 80 y.o. male with   1. Adenocarcinoma of the GE junction, cTX N1 M0, HER2 negative, MMR normal, PD-L1 CPS 3% -He presented with 6-8 months of dysphagia and 10 pounds weight loss and a palpable right cervical lymph node. Initial CT neck 10/22/20 showed 2.8 cm LN, concerning for metastatic disease.  -CT 12/08/20 showed suspicious mass at GE junction and prominent gastrohepatic LNs. EGD 12/17/20 by Dr. Fuller Plan showed 5 cm distal esophageal mass, biopsy confirmed adenocarcinoma with signet ring features. HER 2 negative -PET scan 12/30/20 showed FDG-avid mass at the GE junction and mild uptake with borderline gastrohepatic ligament lymph nodes, no distant metastasis. -He began concurrent chemo RT with weekly carbo/Taxol 01/18/21, C1 carbo was dose reduced to AUC 1.5 for age and co-morbidities. He tolerated moderately well with fatigue, decreased appetite/taste change, nausea, and constipation. -cervical lymph node biopsy on 01/29/21 showed atypical cells.  -he continues to tolerate treatment well overall. He has developed some expected dysphagia but does not require medical intervention at this time. His BG is low at 62 today, and I advised him to have a snack or some juice. His  potassium is also low, so I advised him to increase his current potassium dose (currently taking 3x a week) to daily.   2. Symptom Management: Weight loss, Decreased appetite/Taste change -Continue follow-up with dietitian -started on megace 02/01/21, which has helped. He is eating "anything he wants."   3. Goals of care -He understands if work-up confirms metastatic disease, it will not be curable but still treatable, treatment will be palliative   4. Comorbidities: CVA with residual right-sided weakness, history of MI, CAD, HTN, DM, HL -Patient lives alone, his daughter checks on him frequently to help with housework and food prep -He is limited in his ADLs due to residual right-sided weakness from CVA -He ambulates with support, can bathe and dress with difficulty     PLAN: -proceed with week 5 carbo/taxol at same dose today  -continue daily radiation -lab, f/u, and final carbo/taxol in 1 week  -increase KCL to daily due to hypokalemia today -gave juice and nutritional shake today in clinic for his hypoglycemia, he will contact his PCP for insulin adjustment    No problem-specific Assessment & Plan notes found for this encounter.   SUMMARY OF ONCOLOGIC HISTORY: Oncology History Overview Note  Cancer Staging Esophageal cancer [facility]) Staging form: Esophagus - Adenocarcinoma, AJCC 8th Edition - Clinical stage from 12/31/2020: Stage IIA (cT1, cN1, cM0, GX) - Unsigned    Esophageal cancer ([place])  10/22/2020 Imaging   CT neck IMPRESSION: 2.8 cm right level 2A lymph node, which appears to be centrally necrotic, concerning for metastatic disease. No additional lymphadenopathy or other acute process in the neck.     11/30/2020 Imaging   Barium swallow FINDINGS: Limitations on positioning due to sequelae of stroke. The  patient swallowed barium without difficulty. No aspiration. There is no mass or stricture. Normal motility. There is no hiatal hernia. No spontaneous  gastroesophageal reflux. Barium pill was swallowed and passed without difficulty.   IMPRESSION: No significant abnormality.   12/08/2020 Imaging   CT chest abdomen with contrast IMPRESSION: 1. Mass is suspected just at the level of the GE junction. Suspicious for primary esophageal neoplasm. Further evaluation with endoscopy is advised. 2. Prominent perigastric lymph nodes are identified, suspicious for metastatic adenopathy. 3. Gallstone. 4. Coronary artery calcifications. 5. Aortic Atherosclerosis (ICD10-I70.0) and Emphysema (ICD10-J43.9). 6. These results will be called to the ordering clinician or representative by the Radiologist Assistant, and communication documented in the PACS or [employer].     12/17/2020 Procedure   EGD by Dr. Fuller Plan - A medium-sized, fungating mass with contact bleeding was found in the distal esophagus, 36 cm from the incisors extending to 41 cm in the cardia. The mass was partially obstructing and circumferential.   12/17/2020 Initial Biopsy   Diagnosis Esophagus, biopsy, distal and gastric cardia mass - ADENOCARCINOMA WITH SIGNET RING FEATURES.   12/19/2020 Initial Diagnosis   Esophageal cancer ([place])   12/30/2020 PET scan   IMPRESSION: 1. Mass at the level of the GE junction and proximal stomach is FDG avid within SUV max of 7.62. Findings compatible with primary gastric neoplasm. 2. There is mild FDG uptake associated with borderline gastrohepatic ligament lymph nodes which have an SUV max of 2.97. Equivocal for nodal metastasis. 3. No signs of distant metastatic disease. 4. Aortic Atherosclerosis (ICD10-I70.0) and Emphysema (ICD10-J43.9). Coronary artery calcifications. 5. Gallstones. 6. Prostate gland enlargement.   01/08/2021 Imaging   EXAM: ULTRASOUND OF HEAD/NECK SOFT TISSUES  IMPRESSION: Sonographic evaluation of the right-side of the neck confirms a minimally complex cystic lesion with broad differential considerations  including cystic/necrotic cervical lymphadenopathy (favored given presence of an additional smaller apparently partially cystic right cervical lymph node), compatible with the findings on preceding PET-CT.   This minimally complex cystic structure/mass could undergo ultrasound-guided biopsy/aspiration as indicated.   01/18/2021 -  Chemotherapy   Patient is on Treatment Plan : ESOPHAGUS Carboplatin/PACLitaxel weekly x 6 weeks with XRT       01/29/2021 Pathology Results   A. RIGHT, SUBMANDIBULAR CYST, FINE NEEDLE  ASPIRATION:   FINAL MICROSCOPIC DIAGNOSIS:  - Atypical cells present DIAGNOSTIC COMMENTS:  There are clusters of epithelioid cells with nuclear grooves and  pseudoinclusions admixed with pigmented histiocytes.  There is  insufficient material for ancillary studies.  Excisional biopsy may be  of interest.      INTERVAL HISTORY:  Jeffrey [name] is here for a follow up of esophageal cancer. He was last seen by me on 02/08/21. He presents to the clinic accompanied by his daughter. He reports recent mild nose bleeds and some increased dysphagia. His daughter also notes continued fatigue.   All other systems were reviewed with the patient and are negative.  MEDICAL HISTORY:  Past Medical History:  Diagnosis Date   Anterior cerebral circulation hemorrhagic infarction [facility]) 11/24/2014   March, 2016, dominant left thalamic and left internal capsule ischemic infarct with resultant hemorrhagic transformation, secondary to small vessel disease. Resulted in right hemiparesis dysarthria and diplopia   //   readmission with aphasia July, 2016, this improved    Arthritis    CAD (coronary artery disease)    PCI distal RCA ...2004, residual 70% LAD   /   ...nuclear...03/2007...no ischemia.Marland KitchenMarland Kitchenpreserved LV /  nuclear...03/03/2009...inferior scar..no ischemia..EF 51%  Cyst of nasopharynx    per ENT Wilburn Cornelia   Dyslipidemia    takes Atorvastatin daily   GERD (gastroesophageal reflux  disease)    takes Omeprazole daily   Gout    takes Allopurinol daily and Colchicine as needed   HCAP (healthcare-associated pneumonia)    Hemiparesis affecting right side as late effect of cerebrovascular accident ([place]) 09/05/2014   History of colon polyps    HTN (hypertension)    takes Cardura,Metoprolol,[name],and Amlodipine daily   Internal hemorrhoids    Myocardial infarction [facility]) 24yr ago   Peripheral edema    takes Lasix daily   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    SURGICAL HISTORY: Past Surgical History:  Procedure Laterality Date   BALLOON ANGIOPLASTY, ARTERY  1992, 2004   CAD, Dr. KRon Parker  CARDIAC CATHETERIZATION  2004   CATARACT EXTRACTION Bilateral 2010   COLONOSCOPY  04/03/2002   adenomatous polyp, int hemorrhoids   COLONOSCOPY  07/18/2007   normal (Dr. SFuller Plan   COLONOSCOPY  09/26/2012   tubular adenoma, sm int hem, rpt 5 yrs (Fuller Plan   ELBOW SURGERY Right 1997   eye lids raised     NASAL SEPTUM SURGERY     POLYPECTOMY N/A 05/27/2013   Procedure: ENDOSCOPIC NASOPHARYNGEAL MASS;  Surgeon: DJerrell Belfast MD   ROTATOR CUFF REPAIR  09/2011   left, with subacromial decompression   TONSILLECTOMY AND ADENOIDECTOMY      I have reviewed the social history and family history with the patient and they are unchanged from previous note.  ALLERGIES:  is allergic to niacin.  MEDICATIONS:  Current Outpatient Medications  Medication Sig Dispense Refill   atorvastatin (LIPITOR) 80 MG tablet Take 80 mg by mouth at bedtime.     baclofen (LIORESAL) 10 MG tablet Take 10 mg by mouth 3 (three) times daily.     cholecalciferol (VITAMIN D3) 25 MCG (1000 UNIT) tablet Take 1,000 Units by mouth daily.     clopidogrel (PLAVIX) 75 MG tablet Take 1 tablet  (75 mg total) by mouth daily.     empagliflozin (JARDIANCE) 25 MG TABS tablet Take 25 mg by mouth daily.     fosinopril ([name]) 40 MG tablet Take 1 tablet (40 mg total) by mouth daily.     furosemide (LASIX) 20 MG tablet Take 20-40 mg by mouth See admin instructions. Take 20 mg on Mon, Wed, Fri, and Sun. Take 40 mg on Tues, Thurs, and Sat     insulin aspart (NOVOLOG FLEXPEN) 100 UNIT/ML FlexPen Inject 10 Units into the skin 2 (two) times daily with a meal. Breakfast and dinner     insulin glargine (LANTUS) 100 UNIT/ML Solostar Pen Inject 24 Units into the skin at bedtime.     LACTOBACILLUS PO Take 1 capsule by mouth every Monday, Wednesday, and Friday.     loratadine (CLARITIN) 10 MG tablet Take 10 mg by mouth.     megestrol (MEGACE ES) 625 MG/5ML suspension Take 5 mLs (625 mg total) by mouth daily. 150 mL 2   metoprolol tartrate (LOPRESSOR) 25 MG tablet Take 1 tablet (25 mg total) by mouth 2 (two) times daily.     mupirocin cream (BACTROBAN) 2 % Apply 1 application topically 2 (two) times daily. (Patient  not taking: Reported on 01/26/2021) 15 g 0   Olopatadine HCl (PATADAY OP) Place 1 drop into both eyes daily as needed (allergies).     ondansetron (ZOFRAN) 8 MG tablet Take 1 tablet (8 mg total) by mouth 2 (two) times daily as needed for refractory nausea / vomiting. Start on day 3 after chemo. 30 tablet 1   pantoprazole (PROTONIX) 20 MG tablet TAKE 1 TABLET BY MOUTH EVERY DAY 30 tablet 3   Potassium 99 MG TABS Take 1 tablet (99 mg total) by mouth daily as needed (when you take 2 lasix pills). (Patient taking differently: Take 1 tablet by mouth 3 (three) times a week. Tues, Thurs, and Sat)     prochlorperazine (COMPAZINE) 10 MG tablet Take 1 tablet (10 mg total) by mouth every 6 (six) hours as needed (Nausea or vomiting). 30 tablet 1   sertraline (ZOLOFT) 25 MG tablet Take 1 tablet (25 mg total) by mouth daily.     No current facility-administered medications for this visit.    PHYSICAL  EXAMINATION: ECOG PERFORMANCE STATUS: 3 - Symptomatic, >50% confined to bed  Vitals:   02/15/21 1113  BP: 108/67  Pulse: 64  Resp: 16  Temp: 98.4 F (36.9 C)  SpO2: 100%   Wt Readings from Last 3 Encounters:  02/15/21 152 lb 4.8 oz (69.1 kg)  02/08/21 158 lb 11.2 oz (72 kg)  02/01/21 153 lb 4.8 oz (69.5 kg)     GENERAL:alert, no distress and comfortable SKIN: skin color normal, no rashes or significant lesions EYES: normal, Conjunctiva are pink and non-injected, sclera clear  NEURO: alert & oriented x 3 with fluent speech  LABORATORY DATA:  I have reviewed the data as listed CBC Latest Ref Rng & Units 02/15/2021 02/08/2021 02/01/2021  WBC 4.0 - 10.5 K/uL 2.3(L) 2.1(L) 2.9(L)  Hemoglobin 13.0 - 17.0 g/dL 12.8(L) 12.8(L) 14.1  Hematocrit 39.0 - 52.0 % 37.8(L) 38.0(L) 42.2  Platelets 150 - 400 K/uL 135(L) 120(L) 129(L)     CMP Latest Ref Rng & Units 02/15/2021 02/08/2021 02/01/2021  Glucose 70 - 99 mg/dL 62(L) 116(H) 135(H)  BUN 8 - 23 mg/dL _0 Creatinine 0.61 - 1.24 mg/dL 0.81 0.82 0.88  Sodium 135 - 145 mmol/L 143 139 142  Potassium 3.5 - 5.1 mmol/L 3.1(L) 3.7 3.7  Chloride 98 - 111 mmol/L 113(H) 112(H) 106  CO2 22 - 32 mmol/L 21(L) 21(L) 27  Calcium 8.9 - 10.3 mg/dL 8.4(L) 8.9 9.2  Total Protein 6.5 - 8.1 g/dL 5.9(L) 5.9(L) 6.4(L)  Total Bilirubin 0.3 - 1.2 mg/dL 0.7 0.6 0.7  Alkaline Phos 38 - 126 U/L 68 76 98  AST 15 - 41 U/L 18 13(L) 16  ALT 0 - 44 U/L _1 RADIOGRAPHIC STUDIES: I have personally reviewed the radiological images as listed and agreed with the findings in the report. No results found.    No orders of the defined types were placed in this encounter.  All questions were answered. The patient knows to call the clinic with any problems, questions or concerns. No barriers to learning was detected. The total time spent in the appointment was 30 minutes.     Truitt Merle, MD 02/15/2021   I, Wilburn Mylar, am acting as scribe  for Truitt Merle, MD.   I have reviewed the above documentation for accuracy and completeness, and I agree with the above.

## 2021-02-15 NOTE — Progress Notes (Signed)
Nutrition follow-up completed with patient during infusion for esophageal cancer.  Patient's weight documented as 152.3 pounds December 12 down from 158.7 pounds December 5.  Labs noted: Potassium 3.1, glucose 62, albumin 3.4.  Reports taking Megace but has not really noticed an increase in appetite. He states he continues to drink Ensure Plus mixed with milk with good tolerance. He is forcing himself to try to eat even though food does not have much taste.  Nutrition diagnosis: Unintentional weight loss continues.  Intervention: Provided support and encouragement for patient to continue strategies for smaller more frequent meals and snacks.  Continue Ensure Plus at least twice daily mixed with milk. Continue to focus on foods he can eat without difficulty.  Monitoring, evaluation, goals: Patient will continue to work to increase calories and protein to minimize weight loss.  Next visit: Monday, December 19 during infusion.  **Disclaimer: This note was dictated with voice recognition software. Similar sounding words can inadvertently be transcribed and this note may contain transcription errors which may not have been corrected upon publication of note.**

## 2021-02-16 ENCOUNTER — Ambulatory Visit
Admission: RE | Admit: 2021-02-16 | Discharge: 2021-02-16 | Disposition: A | Discharge status: Home / Self Care | Payer: PPO | Source: Ambulatory Visit | Attending: Radiation Oncology | Admitting: Radiation Oncology

## 2021-02-16 DIAGNOSIS — Z510 Encounter for antineoplastic radiation therapy: Secondary | ICD-10-CM | POA: Diagnosis not present

## 2021-02-16 DIAGNOSIS — C160 Malignant neoplasm of cardia: Secondary | ICD-10-CM | POA: Diagnosis not present

## 2021-02-17 ENCOUNTER — Other Ambulatory Visit: Payer: Self-pay

## 2021-02-17 ENCOUNTER — Ambulatory Visit
Admission: RE | Admit: 2021-02-17 | Discharge: 2021-02-17 | Disposition: A | Discharge status: Home / Self Care | Payer: PPO | Source: Ambulatory Visit | Attending: Radiation Oncology | Admitting: Radiation Oncology

## 2021-02-17 DIAGNOSIS — C160 Malignant neoplasm of cardia: Secondary | ICD-10-CM | POA: Diagnosis not present

## 2021-02-17 DIAGNOSIS — Z510 Encounter for antineoplastic radiation therapy: Secondary | ICD-10-CM | POA: Diagnosis not present

## 2021-02-18 ENCOUNTER — Ambulatory Visit
Admission: RE | Admit: 2021-02-18 | Discharge: 2021-02-18 | Disposition: A | Discharge status: Home / Self Care | Payer: PPO | Source: Ambulatory Visit | Attending: Radiation Oncology | Admitting: Radiation Oncology

## 2021-02-18 DIAGNOSIS — C160 Malignant neoplasm of cardia: Secondary | ICD-10-CM | POA: Diagnosis not present

## 2021-02-18 DIAGNOSIS — Z510 Encounter for antineoplastic radiation therapy: Secondary | ICD-10-CM | POA: Diagnosis not present

## 2021-02-19 ENCOUNTER — Other Ambulatory Visit: Payer: Self-pay

## 2021-02-19 ENCOUNTER — Ambulatory Visit
Admission: RE | Admit: 2021-02-19 | Discharge: 2021-02-19 | Disposition: A | Discharge status: Home / Self Care | Payer: PPO | Source: Ambulatory Visit | Attending: Radiation Oncology | Admitting: Radiation Oncology

## 2021-02-19 DIAGNOSIS — Z510 Encounter for antineoplastic radiation therapy: Secondary | ICD-10-CM | POA: Diagnosis not present

## 2021-02-19 DIAGNOSIS — C160 Malignant neoplasm of cardia: Secondary | ICD-10-CM | POA: Diagnosis not present

## 2021-02-19 MED FILL — Dexamethasone Sodium Phosphate Inj 100 MG/10ML: INTRAMUSCULAR | Qty: 1 | Status: AC

## 2021-02-22 ENCOUNTER — Inpatient Hospital Stay (HOSPITAL_BASED_OUTPATIENT_CLINIC_OR_DEPARTMENT_OTHER): Payer: PPO | Admitting: Hematology

## 2021-02-22 ENCOUNTER — Other Ambulatory Visit: Payer: Self-pay

## 2021-02-22 ENCOUNTER — Encounter: Payer: Self-pay | Admitting: Hematology

## 2021-02-22 ENCOUNTER — Inpatient Hospital Stay: Payer: PPO

## 2021-02-22 ENCOUNTER — Inpatient Hospital Stay: Payer: PPO | Admitting: Nutrition

## 2021-02-22 ENCOUNTER — Ambulatory Visit
Admission: RE | Admit: 2021-02-22 | Discharge: 2021-02-22 | Disposition: A | Discharge status: Home / Self Care | Payer: PPO | Source: Ambulatory Visit | Attending: Radiation Oncology | Admitting: Radiation Oncology

## 2021-02-22 VITALS — BP 102/70 | HR 74 | Temp 98.0°F | Resp 18 | Ht 68.0 in | Wt 148.7 lb

## 2021-02-22 DIAGNOSIS — C158 Malignant neoplasm of overlapping sites of esophagus: Secondary | ICD-10-CM

## 2021-02-22 DIAGNOSIS — Z510 Encounter for antineoplastic radiation therapy: Secondary | ICD-10-CM | POA: Diagnosis not present

## 2021-02-22 DIAGNOSIS — C160 Malignant neoplasm of cardia: Secondary | ICD-10-CM | POA: Diagnosis not present

## 2021-02-22 DIAGNOSIS — C155 Malignant neoplasm of lower third of esophagus: Secondary | ICD-10-CM

## 2021-02-22 LAB — CBC WITH DIFFERENTIAL (CANCER CENTER ONLY)
Abs Immature Granulocytes: 0.01 10*3/uL (ref 0.00–0.07)
Basophils Absolute: 0 10*3/uL (ref 0.0–0.1)
Basophils Relative: 1 %
Eosinophils Absolute: 0 10*3/uL (ref 0.0–0.5)
Eosinophils Relative: 1 %
HCT: 37.2 % — ABNORMAL LOW (ref 39.0–52.0)
Hemoglobin: 12.8 g/dL — ABNORMAL LOW (ref 13.0–17.0)
Immature Granulocytes: 1 %
Lymphocytes Relative: 4 %
Lymphs Abs: 0.1 10*3/uL — ABNORMAL LOW (ref 0.7–4.0)
MCH: 30.1 pg (ref 26.0–34.0)
MCHC: 34.4 g/dL (ref 30.0–36.0)
MCV: 87.5 fL (ref 80.0–100.0)
Monocytes Absolute: 0.2 10*3/uL (ref 0.1–1.0)
Monocytes Relative: 9 %
Neutro Abs: 1.7 10*3/uL (ref 1.7–7.7)
Neutrophils Relative %: 84 %
Platelet Count: 102 10*3/uL — ABNORMAL LOW (ref 150–400)
RBC: 4.25 MIL/uL (ref 4.22–5.81)
RDW: 14.3 % (ref 11.5–15.5)
WBC Count: 2 10*3/uL — ABNORMAL LOW (ref 4.0–10.5)
nRBC: 0 % (ref 0.0–0.2)

## 2021-02-22 LAB — CMP (CANCER CENTER ONLY)
ALT: 21 U/L (ref 0–44)
AST: 17 U/L (ref 15–41)
Albumin: 3.2 g/dL — ABNORMAL LOW (ref 3.5–5.0)
Alkaline Phosphatase: 71 U/L (ref 38–126)
Anion gap: 8 (ref 5–15)
BUN: 16 mg/dL (ref 8–23)
CO2: 22 mmol/L (ref 22–32)
Calcium: 8.6 mg/dL — ABNORMAL LOW (ref 8.9–10.3)
Chloride: 111 mmol/L (ref 98–111)
Creatinine: 0.84 mg/dL (ref 0.61–1.24)
GFR, Estimated: >60 mL/min (ref >60)
Glucose, Bld: 61 mg/dL — ABNORMAL LOW (ref 70–99)
Potassium: 3.2 mmol/L — ABNORMAL LOW (ref 3.5–5.1)
Sodium: 141 mmol/L (ref 135–145)
Total Bilirubin: 0.7 mg/dL (ref 0.3–1.2)
Total Protein: 5.8 g/dL — ABNORMAL LOW (ref 6.5–8.1)

## 2021-02-22 MED ORDER — SODIUM CHLORIDE 0.9 % IV SOLN
132.1500 mg | Freq: Once | INTRAVENOUS | Status: AC
  Administered 2021-02-22: 15:00:00 130 mg via INTRAVENOUS
  Filled 2021-02-22: qty 13

## 2021-02-22 MED ORDER — FAMOTIDINE 20 MG IN NS 100 ML IVPB
20.0000 mg | Freq: Once | INTRAVENOUS | Status: AC
  Administered 2021-02-22: 13:00:00 20 mg via INTRAVENOUS
  Filled 2021-02-22: qty 100

## 2021-02-22 MED ORDER — SODIUM CHLORIDE 0.9 % IV SOLN
10.0000 mg | Freq: Once | INTRAVENOUS | Status: AC
  Administered 2021-02-22: 13:00:00 10 mg via INTRAVENOUS
  Filled 2021-02-22: qty 10

## 2021-02-22 MED ORDER — SODIUM CHLORIDE 0.9 % IV SOLN: Freq: Once | INTRAVENOUS | Status: AC

## 2021-02-22 MED ORDER — LORATADINE 10 MG PO TABS
10.0000 mg | ORAL_TABLET | Freq: Every day | ORAL | Status: DC
  Administered 2021-02-22: 13:00:00 10 mg via ORAL
  Filled 2021-02-22: qty 1

## 2021-02-22 MED ORDER — SODIUM CHLORIDE 0.9 % IV SOLN
50.0000 mg/m2 | Freq: Once | INTRAVENOUS | Status: AC
  Administered 2021-02-22: 14:00:00 96 mg via INTRAVENOUS
  Filled 2021-02-22: qty 16

## 2021-02-22 MED ORDER — PALONOSETRON HCL INJECTION 0.25 MG/5ML
0.2500 mg | Freq: Once | INTRAVENOUS | Status: AC
  Administered 2021-02-22: 13:00:00 0.25 mg via INTRAVENOUS
  Filled 2021-02-22: qty 5

## 2021-02-22 NOTE — Patient Instructions (Signed)
[facility] MEDICAL ONCOLOGY  Discharge Instructions: Thank you for choosing [facility] to provide your oncology and hematology care.   If you have a lab appointment with the Cancer Center, please go directly to the Cancer Center and check in at the registration area.   Wear comfortable clothing and clothing appropriate for easy access to any Portacath or PICC line.   We strive to give you quality time with your provider. You may need to reschedule your appointment if you arrive late (15 or more minutes).  Arriving late affects you and other patients whose appointments are after yours.  Also, if you miss three or more appointments without notifying the office, you may be dismissed from the clinic at the provider's discretion.      For prescription refill requests, have your pharmacy contact our office and allow 72 hours for refills to be completed.    Today you received the following chemotherapy and/or immunotherapy agents Taxol and Carboplatin       To help prevent nausea and vomiting after your treatment, we encourage you to take your nausea medication as directed.  BELOW ARE SYMPTOMS THAT SHOULD BE REPORTED IMMEDIATELY: *FEVER GREATER THAN 100.4 F (38 C) OR HIGHER *CHILLS OR SWEATING *NAUSEA AND VOMITING THAT IS NOT CONTROLLED WITH YOUR NAUSEA MEDICATION *UNUSUAL SHORTNESS OF BREATH *UNUSUAL BRUISING OR BLEEDING *URINARY PROBLEMS (pain or burning when urinating, or frequent urination) *BOWEL PROBLEMS (unusual diarrhea, constipation, pain near the anus) TENDERNESS IN MOUTH AND THROAT WITH OR WITHOUT PRESENCE OF ULCERS (sore throat, sores in mouth, or a toothache) UNUSUAL RASH, SWELLING OR PAIN  UNUSUAL VAGINAL DISCHARGE OR ITCHING   Items with * indicate a potential emergency and should be followed up as soon as possible or go to the Emergency Department if any problems should occur.  Please show the CHEMOTHERAPY ALERT CARD or IMMUNOTHERAPY ALERT CARD at  check-in to the Emergency Department and triage nurse.  Should you have questions after your visit or need to cancel or reschedule your appointment, please contact [facility]  Dept: [phone]  and follow the prompts.  Office hours are 8:00 a.m. to 4:30 p.m. Monday - Friday. Please note that voicemails left after 4:00 p.m. may not be returned until the following business day.  We are closed weekends and major holidays. You have access to a nurse at all times for urgent questions. Please call the main number to the clinic Dept: [phone] and follow the prompts.   For any non-urgent questions, you may also contact your provider using MyChart. We now offer e-Visits for anyone 18 and older to request care online for non-urgent symptoms. For details visit [email or web address].   Also download the MyChart app! Go to the app store, search "MyChart", open the app, select [place], and log in with your MyChart username and password.  Due to Covid, a mask is required upon entering the hospital/clinic. If you do not have a mask, one will be given to you upon arrival. For doctor visits, patients may have 1 support person aged 18 or older with them. For treatment visits, patients cannot have anyone with them due to current Covid guidelines and our immunocompromised population.   

## 2021-02-22 NOTE — Progress Notes (Signed)
Brief follow-up completed with patient and his daughter.  Patient complains of increased fatigue.  He has not been drinking as much Ensure plus because he is getting tired of drinking it.  He continues to force himself to eat what he can. Final radiation therapy is scheduled for Friday, December 23.  Weight decreased to 148.7 pounds December 19 from 152.3 pounds December 12.  Labs noted: Potassium 3.2, glucose 61, Albumin 3.2.  Nutrition diagnosis: Unintentional weight loss continues.  Intervention: Provided support and encouragement for patient to increase calories and protein. Educated patient he may have more energy if he could eat smaller amounts more often. Provided recipes to create different smoothies and shakes for variety.  Monitoring, evaluation, goals: Patient will work to increase calories and protein to minimize further weight loss.  Next visit: To be scheduled as needed.  **Disclaimer: This note was dictated with voice recognition software. Similar sounding words can inadvertently be transcribed and this note may contain transcription errors which may not have been corrected upon publication of note.**

## 2021-02-22 NOTE — Progress Notes (Signed)
[place]   [phone]) [phone] Fax:(336) [phone]   Clinic Follow up Note   Patient Care Team: [name], MD as PCP - General (Family Medicine) Jerline Pain, MD as PCP - Cardiology (Cardiology) Delia Chimes, NP (Inactive) as Nurse Practitioner (Nurse Practitioner) Ladene Artist, MD as Consulting Physician (Gastroenterology) Alla Feeling, NP as Nurse Practitioner (Nurse Practitioner) Truitt Merle, MD as Consulting Physician (Hematology) Truitt Merle, MD as Consulting Physician (Oncology)  Date of Service:  02/22/2021  CHIEF COMPLAINT: f/u of esophageal cancer  CURRENT THERAPY:  Concurrent chemoRT with weekly carbo/taxol, starting 01/18/21  ASSESSMENT & PLAN:  Jeffrey [name] is a 80 y.o. male with   1. Adenocarcinoma of the GE junction, cTX N1 M0, HER2 negative, MMR normal, PD-L1 CPS 3% -He presented with 6-8 months of dysphagia and 10 pounds weight loss and a palpable right cervical lymph node. Initial CT neck 10/22/20 showed 2.8 cm LN, concerning for metastatic disease.  -CT 12/08/20 showed suspicious mass at GE junction and prominent gastrohepatic LNs. EGD 12/17/20 by Dr. Fuller Plan showed 5 cm distal esophageal mass, biopsy confirmed adenocarcinoma with signet ring features. HER 2 negative -PET scan 12/30/20 showed FDG-avid mass at the GE junction and mild uptake with borderline gastrohepatic ligament lymph nodes, no distant metastasis. -He began concurrent chemo RT with weekly carbo/Taxol 01/18/21, C1 carbo was dose reduced to AUC 1.5 for age and co-morbidities. He tolerated moderately well with fatigue, decreased appetite/taste change, nausea, and constipation. -cervical lymph node biopsy on 01/29/21 showed atypical cells.  -he has tolerated treatment well overall. He has developed some expected dysphagia and shortness of breath, no need for medical intervention at this time. He will finish chemo today and radiation on Friday this week. -He is a poor  candidate for surgery due to his advanced age and medical comorbilities. He is not sure if he wans surgery  -he may not be able to tolerate consolidation FOLFOX, I recommend Nivo for one year if no surgery, plan to start ina month. I reviewed the benefit and potential AEs, he wants to think about it   2. Symptom Management: Weight loss, Decreased appetite/Taste change -Continue follow-up with dietitian -started on megace 02/01/21, which has helped. He is eating "anything he wants." -he has lost 4 lbs in the last week. He notes he is not drinking nutrition supplements because "they got old" (he got tired of drinking them).   3. Goals of care -He understands if work-up confirms metastatic disease, it will not be curable but still treatable, treatment will be palliative   4. Comorbidities: CVA with residual right-sided weakness, history of MI, CAD, HTN, DM, HL -Patient lives alone, his daughter checks on him frequently to help with housework and food prep -He is limited in his ADLs due to residual right-sided weakness from CVA -He ambulates with support, can bathe and dress with difficulty     PLAN: -proceed with final carbo/taxol at same dose today  -continue daily radiation through 12/23 -lab and f/u in 2 weeks    No problem-specific Assessment & Plan notes found for this encounter.   SUMMARY OF ONCOLOGIC HISTORY: Oncology History Overview Note  Cancer Staging Esophageal cancer [facility]) Staging form: Esophagus - Adenocarcinoma, AJCC 8th Edition - Clinical stage from 12/31/2020: Stage IIA (cT1, cN1, cM0, GX) - Unsigned    Esophageal cancer ([place])  10/22/2020 Imaging   CT neck IMPRESSION: 2.8 cm right level 2A lymph node, which appears to be centrally necrotic, concerning for metastatic  disease. No additional lymphadenopathy or other acute process in the neck.     11/30/2020 Imaging   Barium swallow FINDINGS: Limitations on positioning due to sequelae of stroke. The  patient swallowed barium without difficulty. No aspiration. There is no mass or stricture. Normal motility. There is no hiatal hernia. No spontaneous gastroesophageal reflux. Barium pill was swallowed and passed without difficulty.   IMPRESSION: No significant abnormality.   12/08/2020 Imaging   CT chest abdomen with contrast IMPRESSION: 1. Mass is suspected just at the level of the GE junction. Suspicious for primary esophageal neoplasm. Further evaluation with endoscopy is advised. 2. Prominent perigastric lymph nodes are identified, suspicious for metastatic adenopathy. 3. Gallstone. 4. Coronary artery calcifications. 5. Aortic Atherosclerosis (ICD10-I70.0) and Emphysema (ICD10-J43.9). 6. These results will be called to the ordering clinician or representative by the Radiologist Assistant, and communication documented in the PACS or [employer].     12/17/2020 Procedure   EGD by Dr. Fuller Plan - A medium-sized, fungating mass with contact bleeding was found in the distal esophagus, 36 cm from the incisors extending to 41 cm in the cardia. The mass was partially obstructing and circumferential.   12/17/2020 Initial Biopsy   Diagnosis Esophagus, biopsy, distal and gastric cardia mass - ADENOCARCINOMA WITH SIGNET RING FEATURES.   12/19/2020 Initial Diagnosis   Esophageal cancer ([place])   12/30/2020 PET scan   IMPRESSION: 1. Mass at the level of the GE junction and proximal stomach is FDG avid within SUV max of 7.62. Findings compatible with primary gastric neoplasm. 2. There is mild FDG uptake associated with borderline gastrohepatic ligament lymph nodes which have an SUV max of 2.97. Equivocal for nodal metastasis. 3. No signs of distant metastatic disease. 4. Aortic Atherosclerosis (ICD10-I70.0) and Emphysema (ICD10-J43.9). Coronary artery calcifications. 5. Gallstones. 6. Prostate gland enlargement.   01/08/2021 Imaging   EXAM: ULTRASOUND OF HEAD/NECK SOFT  TISSUES  IMPRESSION: Sonographic evaluation of the right-side of the neck confirms a minimally complex cystic lesion with broad differential considerations including cystic/necrotic cervical lymphadenopathy (favored given presence of an additional smaller apparently partially cystic right cervical lymph node), compatible with the findings on preceding PET-CT.   This minimally complex cystic structure/mass could undergo ultrasound-guided biopsy/aspiration as indicated.   01/18/2021 -  Chemotherapy   Patient is on Treatment Plan : ESOPHAGUS Carboplatin/PACLitaxel weekly x 6 weeks with XRT       01/29/2021 Pathology Results   A. RIGHT, SUBMANDIBULAR CYST, FINE NEEDLE  ASPIRATION:   FINAL MICROSCOPIC DIAGNOSIS:  - Atypical cells present DIAGNOSTIC COMMENTS:  There are clusters of epithelioid cells with nuclear grooves and  pseudoinclusions admixed with pigmented histiocytes.  There is  insufficient material for ancillary studies.  Excisional biopsy may be  of interest.      INTERVAL HISTORY:  JAHMIRE RUFFINS is here for a follow up of esophageal cancer. He was last seen by me on 02/15/21. He presents to the clinic accompanied by his daughter. He reports he had some shortness of breath recently, as well as fatigue. He has also had some blood with blowing his nose.   All other systems were reviewed with the patient and are negative.  MEDICAL HISTORY:  Past Medical History:  Diagnosis Date   Anterior cerebral circulation hemorrhagic infarction [facility]) 11/24/2014   March, 2016, dominant left thalamic and left internal capsule ischemic infarct with resultant hemorrhagic transformation, secondary to small vessel disease. Resulted in right hemiparesis dysarthria and diplopia   //   readmission with aphasia July, 2016,  this improved    Arthritis    CAD (coronary artery disease)    PCI distal RCA ...2004, residual 70% LAD   /   ...nuclear...03/2007...no ischemia.Marland KitchenMarland Kitchenpreserved LV /   nuclear...03/03/2009...inferior scar..no ischemia..EF 51%   Cyst of nasopharynx    per ENT Wilburn Cornelia   Dyslipidemia    takes Atorvastatin daily   GERD (gastroesophageal reflux disease)    takes Omeprazole daily   Gout    takes Allopurinol daily and Colchicine as needed   HCAP (healthcare-associated pneumonia)    Hemiparesis affecting right side as late effect of cerebrovascular accident ([place]) 09/05/2014   History of colon polyps    HTN (hypertension)    takes Cardura,Metoprolol,[name],and Amlodipine daily   Internal hemorrhoids    Myocardial infarction [facility]) 97yr ago   Peripheral edema    takes Lasix daily   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    SURGICAL HISTORY: Past Surgical History:  Procedure Laterality Date   BALLOON ANGIOPLASTY, ARTERY  1992, 2004   CAD, Dr. KRon Parker  CARDIAC CATHETERIZATION  2004   CATARACT EXTRACTION Bilateral 2010   COLONOSCOPY  04/03/2002   adenomatous polyp, int hemorrhoids   COLONOSCOPY  07/18/2007   normal (Dr. SFuller Plan   COLONOSCOPY  09/26/2012   tubular adenoma, sm int hem, rpt 5 yrs (Fuller Plan   ELBOW SURGERY Right 1997   eye lids raised     NASAL SEPTUM SURGERY     POLYPECTOMY N/A 05/27/2013   Procedure: ENDOSCOPIC NASOPHARYNGEAL MASS;  Surgeon: DJerrell Belfast MD   ROTATOR CUFF REPAIR  09/2011   left, with subacromial decompression   TONSILLECTOMY AND ADENOIDECTOMY      I have reviewed the social history and family history with the patient and they are unchanged from previous note.  ALLERGIES:  is allergic to niacin.  MEDICATIONS:  Current Outpatient Medications  Medication Sig Dispense Refill   atorvastatin (LIPITOR) 80 MG tablet Take 80 mg by mouth at bedtime.     baclofen (LIORESAL) 10 MG tablet Take 10  mg by mouth 3 (three) times daily.     cholecalciferol (VITAMIN D3) 25 MCG (1000 UNIT) tablet Take 1,000 Units by mouth daily.     clopidogrel (PLAVIX) 75 MG tablet Take 1 tablet (75 mg total) by mouth daily.     empagliflozin (JARDIANCE) 25 MG TABS tablet Take 25 mg by mouth daily.     fosinopril ([name]) 40 MG tablet Take 1 tablet (40 mg total) by mouth daily.     furosemide (LASIX) 20 MG tablet Take 20-40 mg by mouth See admin instructions. Take 20 mg on Mon, Wed, Fri, and Sun. Take 40 mg on Tues, Thurs, and Sat     insulin aspart (NOVOLOG FLEXPEN) 100 UNIT/ML FlexPen Inject 10 Units into the skin 2 (two) times daily with a meal. Breakfast and dinner     insulin glargine (LANTUS) 100 UNIT/ML Solostar Pen Inject 24 Units into the skin at bedtime.     LACTOBACILLUS PO Take 1 capsule by mouth every Monday, Wednesday, and Friday.     loratadine (CLARITIN) 10 MG tablet Take 10 mg by mouth.     megestrol (MEGACE ES) 625 MG/5ML suspension Take 5 mLs (625 mg total) by mouth daily. 150 mL  2   metoprolol tartrate (LOPRESSOR) 25 MG tablet Take 1 tablet (25 mg total) by mouth 2 (two) times daily.     mupirocin cream (BACTROBAN) 2 % Apply 1 application topically 2 (two) times daily. (Patient not taking: Reported on 01/26/2021) 15 g 0   Olopatadine HCl (PATADAY OP) Place 1 drop into both eyes daily as needed (allergies).     ondansetron (ZOFRAN) 8 MG tablet Take 1 tablet (8 mg total) by mouth 2 (two) times daily as needed for refractory nausea / vomiting. Start on day 3 after chemo. 30 tablet 1   pantoprazole (PROTONIX) 20 MG tablet TAKE 1 TABLET BY MOUTH EVERY DAY 30 tablet 3   Potassium 99 MG TABS Take 1 tablet (99 mg total) by mouth daily as needed (when you take 2 lasix pills). (Patient taking differently: Take 1 tablet by mouth 3 (three) times a week. Tues, Thurs, and Sat)     prochlorperazine (COMPAZINE) 10 MG tablet Take 1 tablet (10 mg total) by mouth every 6 (six) hours as needed (Nausea or  vomiting). 30 tablet 1   sertraline (ZOLOFT) 25 MG tablet Take 1 tablet (25 mg total) by mouth daily.     No current facility-administered medications for this visit.   Facility-Administered Medications Ordered in Other Visits  Medication Dose Route Frequency Provider Last Rate Last Admin   loratadine (CLARITIN) tablet 10 mg  10 mg Oral Daily Truitt Merle, MD   10 mg at 02/22/21 1259    PHYSICAL EXAMINATION: ECOG PERFORMANCE STATUS: 3 - Symptomatic, >50% confined to bed  Vitals:   02/22/21 1048  BP: 102/70  Pulse: 74  Resp: 18  Temp: 98 F (36.7 C)  SpO2: 100%   Wt Readings from Last 3 Encounters:  02/22/21 148 lb 11.2 oz (67.4 kg)  02/15/21 152 lb 4.8 oz (69.1 kg)  02/08/21 158 lb 11.2 oz (72 kg)     GENERAL:alert, no distress and comfortable SKIN: skin color, texture, turgor are normal, no rashes or significant lesions EYES: normal, Conjunctiva are pink and non-injected, sclera clear  LUNGS: clear to auscultation and percussion with normal breathing effort HEART: regular rate & rhythm and no murmurs and no lower extremity edema  NEURO: alert & oriented x 3 with fluent speech, no focal motor/sensory deficits  LABORATORY DATA:  I have reviewed the data as listed CBC Latest Ref Rng & Units 02/22/2021 02/15/2021 02/08/2021  WBC 4.0 - 10.5 K/uL 2.0(L) 2.3(L) 2.1(L)  Hemoglobin 13.0 - 17.0 g/dL 12.8(L) 12.8(L) 12.8(L)  Hematocrit 39.0 - 52.0 % 37.2(L) 37.8(L) 38.0(L)  Platelets 150 - 400 K/uL 102(L) 135(L) 120(L)     CMP Latest Ref Rng & Units 02/22/2021 02/15/2021 02/08/2021  Glucose 70 - 99 mg/dL 61(L) 62(L) 116(H)  BUN 8 - 23 mg/dL _0 Creatinine 0.61 - 1.24 mg/dL 0.84 0.81 0.82  Sodium 135 - 145 mmol/L 141 143 139  Potassium 3.5 - 5.1 mmol/L 3.2(L) 3.1(L) 3.7  Chloride 98 - 111 mmol/L 111 113(H) 112(H)  CO2 22 - 32 mmol/L 22 21(L) 21(L)  Calcium 8.9 - 10.3 mg/dL 8.6(L) 8.4(L) 8.9  Total Protein 6.5 - 8.1 g/dL 5.8(L) 5.9(L) 5.9(L)  Total Bilirubin 0.3 - 1.2  mg/dL 0.7 0.7 0.6  Alkaline Phos 38 - 126 U/L 71 68 76  AST 15 - 41 U/L 17 18 13(L)  ALT 0 - 44 U/L _1 RADIOGRAPHIC STUDIES: I have personally reviewed the radiological images as listed and  agreed with the findings in the report. No results found.    No orders of the defined types were placed in this encounter.  All questions were answered. The patient knows to call the clinic with any problems, questions or concerns. No barriers to learning was detected. The total time spent in the appointment was 30 minutes.     Truitt Merle, MD 02/22/2021   I, Wilburn Mylar, am acting as scribe for Truitt Merle, MD.   I have reviewed the above documentation for accuracy and completeness, and I agree with the above.

## 2021-02-23 ENCOUNTER — Ambulatory Visit
Admission: RE | Admit: 2021-02-23 | Discharge: 2021-02-23 | Disposition: A | Discharge status: Home / Self Care | Payer: PPO | Source: Ambulatory Visit | Attending: Radiation Oncology | Admitting: Radiation Oncology

## 2021-02-23 DIAGNOSIS — Z510 Encounter for antineoplastic radiation therapy: Secondary | ICD-10-CM | POA: Diagnosis not present

## 2021-02-23 DIAGNOSIS — C160 Malignant neoplasm of cardia: Secondary | ICD-10-CM | POA: Diagnosis not present

## 2021-02-24 ENCOUNTER — Other Ambulatory Visit: Payer: Self-pay

## 2021-02-24 ENCOUNTER — Telehealth: Payer: Self-pay | Admitting: Hematology

## 2021-02-24 ENCOUNTER — Ambulatory Visit
Admission: RE | Admit: 2021-02-24 | Discharge: 2021-02-24 | Disposition: A | Discharge status: Home / Self Care | Payer: PPO | Source: Ambulatory Visit | Attending: Radiation Oncology | Admitting: Radiation Oncology

## 2021-02-24 DIAGNOSIS — Z510 Encounter for antineoplastic radiation therapy: Secondary | ICD-10-CM | POA: Diagnosis not present

## 2021-02-24 DIAGNOSIS — C160 Malignant neoplasm of cardia: Secondary | ICD-10-CM | POA: Diagnosis not present

## 2021-02-24 NOTE — Telephone Encounter (Signed)
Left message with follow-up appointment per 12/19 los.

## 2021-02-25 ENCOUNTER — Ambulatory Visit
Admission: RE | Admit: 2021-02-25 | Discharge: 2021-02-25 | Disposition: A | Discharge status: Home / Self Care | Payer: PPO | Source: Ambulatory Visit | Attending: Radiation Oncology | Admitting: Radiation Oncology

## 2021-02-25 DIAGNOSIS — Z510 Encounter for antineoplastic radiation therapy: Secondary | ICD-10-CM | POA: Diagnosis not present

## 2021-02-25 DIAGNOSIS — C160 Malignant neoplasm of cardia: Secondary | ICD-10-CM | POA: Diagnosis not present

## 2021-02-26 ENCOUNTER — Encounter: Payer: Self-pay | Admitting: Radiation Oncology

## 2021-02-26 ENCOUNTER — Ambulatory Visit
Admission: RE | Admit: 2021-02-26 | Discharge: 2021-02-26 | Disposition: A | Discharge status: Home / Self Care | Payer: PPO | Source: Ambulatory Visit | Attending: Radiation Oncology | Admitting: Radiation Oncology

## 2021-02-26 ENCOUNTER — Other Ambulatory Visit: Payer: Self-pay

## 2021-02-26 DIAGNOSIS — C160 Malignant neoplasm of cardia: Secondary | ICD-10-CM | POA: Diagnosis not present

## 2021-02-26 DIAGNOSIS — Z510 Encounter for antineoplastic radiation therapy: Secondary | ICD-10-CM | POA: Diagnosis not present

## 2021-03-04 ENCOUNTER — Emergency Department (HOSPITAL_COMMUNITY): Payer: PPO

## 2021-03-04 ENCOUNTER — Encounter (HOSPITAL_COMMUNITY): Payer: Self-pay

## 2021-03-04 ENCOUNTER — Ambulatory Visit (INDEPENDENT_AMBULATORY_CARE_PROVIDER_SITE_OTHER): Payer: PPO | Admitting: Nurse Practitioner

## 2021-03-04 ENCOUNTER — Other Ambulatory Visit: Payer: Self-pay

## 2021-03-04 ENCOUNTER — Emergency Department (HOSPITAL_COMMUNITY)
Admission: EM | Admit: 2021-03-04 | Discharge: 2021-03-04 | Disposition: A | Discharge status: Home / Self Care | Payer: PPO | Attending: Emergency Medicine | Admitting: Emergency Medicine

## 2021-03-04 VITALS — BP 90/58 | HR 87 | Temp 97.9°F | Resp 16 | Ht 68.0 in | Wt 137.0 lb

## 2021-03-04 DIAGNOSIS — Z87891 Personal history of nicotine dependence: Secondary | ICD-10-CM | POA: Insufficient documentation

## 2021-03-04 DIAGNOSIS — I110 Hypertensive heart disease with heart failure: Secondary | ICD-10-CM | POA: Insufficient documentation

## 2021-03-04 DIAGNOSIS — I9589 Other hypotension: Secondary | ICD-10-CM | POA: Diagnosis not present

## 2021-03-04 DIAGNOSIS — I5032 Chronic diastolic (congestive) heart failure: Secondary | ICD-10-CM | POA: Insufficient documentation

## 2021-03-04 DIAGNOSIS — E861 Hypovolemia: Secondary | ICD-10-CM | POA: Diagnosis not present

## 2021-03-04 DIAGNOSIS — R0602 Shortness of breath: Secondary | ICD-10-CM | POA: Diagnosis not present

## 2021-03-04 DIAGNOSIS — Z85038 Personal history of other malignant neoplasm of large intestine: Secondary | ICD-10-CM | POA: Insufficient documentation

## 2021-03-04 DIAGNOSIS — E860 Dehydration: Secondary | ICD-10-CM | POA: Diagnosis not present

## 2021-03-04 DIAGNOSIS — R42 Dizziness and giddiness: Secondary | ICD-10-CM | POA: Diagnosis not present

## 2021-03-04 DIAGNOSIS — Z7984 Long term (current) use of oral hypoglycemic drugs: Secondary | ICD-10-CM | POA: Insufficient documentation

## 2021-03-04 DIAGNOSIS — I2510 Atherosclerotic heart disease of native coronary artery without angina pectoris: Secondary | ICD-10-CM | POA: Diagnosis not present

## 2021-03-04 DIAGNOSIS — C158 Malignant neoplasm of overlapping sites of esophagus: Secondary | ICD-10-CM

## 2021-03-04 DIAGNOSIS — R918 Other nonspecific abnormal finding of lung field: Secondary | ICD-10-CM | POA: Diagnosis not present

## 2021-03-04 DIAGNOSIS — R0609 Other forms of dyspnea: Secondary | ICD-10-CM

## 2021-03-04 DIAGNOSIS — Z7902 Long term (current) use of antithrombotics/antiplatelets: Secondary | ICD-10-CM | POA: Insufficient documentation

## 2021-03-04 DIAGNOSIS — I700 Atherosclerosis of aorta: Secondary | ICD-10-CM | POA: Diagnosis not present

## 2021-03-04 DIAGNOSIS — I252 Old myocardial infarction: Secondary | ICD-10-CM | POA: Diagnosis not present

## 2021-03-04 DIAGNOSIS — Z79899 Other long term (current) drug therapy: Secondary | ICD-10-CM | POA: Insufficient documentation

## 2021-03-04 DIAGNOSIS — Z8501 Personal history of malignant neoplasm of esophagus: Secondary | ICD-10-CM | POA: Insufficient documentation

## 2021-03-04 DIAGNOSIS — R110 Nausea: Secondary | ICD-10-CM

## 2021-03-04 DIAGNOSIS — Z794 Long term (current) use of insulin: Secondary | ICD-10-CM | POA: Diagnosis not present

## 2021-03-04 DIAGNOSIS — E1142 Type 2 diabetes mellitus with diabetic polyneuropathy: Secondary | ICD-10-CM | POA: Insufficient documentation

## 2021-03-04 DIAGNOSIS — Z0389 Encounter for observation for other suspected diseases and conditions ruled out: Secondary | ICD-10-CM | POA: Diagnosis not present

## 2021-03-04 LAB — COMPREHENSIVE METABOLIC PANEL
ALT: 25 U/L (ref 0–44)
AST: 16 U/L (ref 15–41)
Albumin: 3.2 g/dL — ABNORMAL LOW (ref 3.5–5.0)
Alkaline Phosphatase: 64 U/L (ref 38–126)
Anion gap: 5 (ref 5–15)
BUN: 32 mg/dL — ABNORMAL HIGH (ref 8–23)
CO2: 24 mmol/L (ref 22–32)
Calcium: 8.8 mg/dL — ABNORMAL LOW (ref 8.9–10.3)
Chloride: 106 mmol/L (ref 98–111)
Creatinine, Ser: 1.32 mg/dL — ABNORMAL HIGH (ref 0.61–1.24)
GFR, Estimated: 55 mL/min — ABNORMAL LOW (ref >60)
Glucose, Bld: 193 mg/dL — ABNORMAL HIGH (ref 70–99)
Potassium: 3.5 mmol/L (ref 3.5–5.1)
Sodium: 135 mmol/L (ref 135–145)
Total Bilirubin: 0.8 mg/dL (ref 0.3–1.2)
Total Protein: 6.1 g/dL — ABNORMAL LOW (ref 6.5–8.1)

## 2021-03-04 LAB — CBC WITH DIFFERENTIAL/PLATELET
Abs Immature Granulocytes: 0.04 10*3/uL (ref 0.00–0.07)
Basophils Absolute: 0 10*3/uL (ref 0.0–0.1)
Basophils Relative: 1 %
Eosinophils Absolute: 0 10*3/uL (ref 0.0–0.5)
Eosinophils Relative: 1 %
HCT: 37.7 % — ABNORMAL LOW (ref 39.0–52.0)
Hemoglobin: 12.9 g/dL — ABNORMAL LOW (ref 13.0–17.0)
Immature Granulocytes: 2 %
Lymphocytes Relative: 9 %
Lymphs Abs: 0.2 10*3/uL — ABNORMAL LOW (ref 0.7–4.0)
MCH: 30.6 pg (ref 26.0–34.0)
MCHC: 34.2 g/dL (ref 30.0–36.0)
MCV: 89.3 fL (ref 80.0–100.0)
Monocytes Absolute: 0.4 10*3/uL (ref 0.1–1.0)
Monocytes Relative: 23 %
Neutro Abs: 1.2 10*3/uL — ABNORMAL LOW (ref 1.7–7.7)
Neutrophils Relative %: 64 %
Platelets: 125 10*3/uL — ABNORMAL LOW (ref 150–400)
RBC: 4.22 MIL/uL (ref 4.22–5.81)
RDW: 15.4 % (ref 11.5–15.5)
WBC: 1.9 10*3/uL — ABNORMAL LOW (ref 4.0–10.5)
nRBC: 0 % (ref 0.0–0.2)

## 2021-03-04 LAB — LIPASE, BLOOD: Lipase: 20 U/L (ref 11–51)

## 2021-03-04 MED ORDER — SODIUM CHLORIDE 0.9 % IV SOLN: INTRAVENOUS | Status: DC

## 2021-03-04 MED ORDER — SODIUM CHLORIDE 0.9 % IV BOLUS
500.0000 mL | Freq: Once | INTRAVENOUS | Status: AC
  Administered 2021-03-04: 21:00:00 500 mL via INTRAVENOUS

## 2021-03-04 MED ORDER — SODIUM CHLORIDE 0.9 % IV BOLUS
500.0000 mL | Freq: Once | INTRAVENOUS | Status: AC
  Administered 2021-03-04: 20:00:00 500 mL via INTRAVENOUS

## 2021-03-04 MED ORDER — IOHEXOL 350 MG/ML SOLN
80.0000 mL | Freq: Once | INTRAVENOUS | Status: AC | PRN
  Administered 2021-03-04: 21:00:00 80 mL via INTRAVENOUS

## 2021-03-04 NOTE — ED Triage Notes (Signed)
Pt arrived via POV, c/o SOB with exertion, orthostatic changes, CA pt. Seen in office today. Currently undergoing tx.

## 2021-03-04 NOTE — ED Provider Notes (Addendum)
[facility] DEPT Provider Note   CSN: [id number] Arrival date & time: 03/04/21  1626     History Chief Complaint  Patient presents with   Shortness of Breath    Jeffrey [name] is a 80 y.o. male.  Patient was at primary care doctor's office today had shortness of breath with exertion and had orthostatic changes.  Patient sent in for concerns for about potential pulmonary embolus.  And for hypovolemia.  Blood pressure upon arrival here was 118/67.  Blood pressure currently 144/80.  We will trend that though because that is a big jump.  No fever.  Heart rate 82 oxygen saturations 96% room air.  It is not clear whether his oxygen sats dropped at the primary care's office.  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  He has been getting chemo and radiation treatment.  Last seen by Dr. Burr Medico on December 19.  Plan was to continue the carbo/talks all and radiation treatment through December 23.  Patient states that he has some difficulty swallowing but nothing is much worse.  But family members think that he is dehydrated.  Patient denies any chest pain or any shortness of breath at rest.      Past Medical History:  Diagnosis Date   Anterior cerebral circulation hemorrhagic infarction [facility]) 11/24/2014   March, 2016, dominant left thalamic and left internal capsule ischemic infarct with resultant hemorrhagic transformation, secondary to small vessel disease. Resulted in right hemiparesis dysarthria and diplopia   //   readmission with aphasia July, 2016, this improved    Arthritis    CAD (coronary artery disease)    PCI distal RCA ...2004, residual 70% LAD   /   ...nuclear...03/2007...no ischemia.Marland KitchenMarland Kitchenpreserved LV /  nuclear...03/03/2009...inferior scar..no ischemia..EF 51%   Cyst of nasopharynx    per ENT Wilburn Cornelia   Dyslipidemia     takes Atorvastatin daily   GERD (gastroesophageal reflux disease)    takes Omeprazole daily   Gout    takes Allopurinol daily and Colchicine as needed   HCAP (healthcare-associated pneumonia)    Hemiparesis affecting right side as late effect of cerebrovascular accident ([place]) 09/05/2014   History of colon polyps    HTN (hypertension)    takes Cardura,Metoprolol,[name],and Amlodipine daily   Internal hemorrhoids    Myocardial infarction [facility]) 27yrs ago   Peripheral edema    takes Lasix daily   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 [name])    Patient Active Problem List   Diagnosis Date Noted   Dizziness 03/04/2021   Hypotension due to hypovolemia 03/04/2021   Nausea 03/04/2021   Esophageal cancer ([place]) 12/19/2020   Benign neoplasm of colon 11/06/2020   Benign prostatic hyperplasia 11/06/2020   Head and neck lymphadenopathy 10/14/2020   Esophageal dysphagia 10/14/2020   Balanitis 10/14/2020   Impaired mobility 04/17/2020   Trigger middle finger of left hand 11/06/2019   Urinary urgency 08/27/2019   Health maintenance examination 07/27/2016   Hypertropia of right eye 03/22/2016   Medicare annual wellness visit, subsequent 07/23/2015   Advanced care planning/counseling discussion 07/23/2015   Diplopia 07/15/2015   Anterior cerebral  circulation hemorrhagic infarction ([place]) 11/24/2014   Hemiparesis affecting right side as late effect of cerebrovascular accident ([place]) 09/05/2014   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 09/05/2014   Chronic diastolic heart failure, NYHA class 1 ([place]) 06/05/2014   Benign paroxysmal positional vertigo 04/28/2014   Right carotid bruit    Seasonal allergic rhinitis    Trigger thumb of left hand 10/12/2011   Dysphonia 01/04/2011   Gout     Adenomatous polyps    CAD (coronary artery disease)    Hypertension associated with diabetes ([place])    Hyperlipidemia associated with type 2 diabetes mellitus ([place])    Type 2 diabetes, controlled, with neuropathy ([facility])    Ejection fraction    DOE (dyspnea on exertion) 02/11/2009    Past Surgical History:  Procedure Laterality Date   BALLOON ANGIOPLASTY, ARTERY  1992, 2004   CAD, Dr. Ron Parker   CARDIAC CATHETERIZATION  2004   CATARACT EXTRACTION Bilateral 2010   COLONOSCOPY  04/03/2002   adenomatous polyp, int hemorrhoids   COLONOSCOPY  07/18/2007   normal (Dr. Fuller Plan)   COLONOSCOPY  09/26/2012   tubular adenoma, sm int hem, rpt 5 yrs Fuller Plan)   ELBOW SURGERY Right 1997   eye lids raised     NASAL SEPTUM SURGERY     POLYPECTOMY N/A 05/27/2013   Procedure: ENDOSCOPIC NASOPHARYNGEAL MASS;  Surgeon: [name], MD   ROTATOR CUFF REPAIR  09/2011   left, with subacromial decompression   TONSILLECTOMY AND ADENOIDECTOMY         Family History  Problem Relation Age of Onset   Alcohol abuse Father    Heart attack Father    Cancer Sister 24       breast cancer   Heart attack Son    Coronary artery disease Neg Hx    Stroke Neg Hx    Diabetes Neg Hx    Colon cancer Neg Hx     Social History   Tobacco Use   Smoking status: Former   Smokeless tobacco: Never   Tobacco comments:    quit smoking at age 65  Vaping Use   Vaping Use: Never used  Substance Use Topics   Alcohol use: No   Drug use: No    Home Medications Prior to Admission medications   Medication Sig Start Date End Date Taking? Authorizing Provider  atorvastatin (LIPITOR) 80 MG tablet Take 80 mg by mouth at bedtime.    [provider]  baclofen (LIORESAL) 10 MG tablet Take 10 mg by mouth 3 (three) times daily. 07/08/20   [provider]  cholecalciferol (VITAMIN D3) 25 MCG (1000 UNIT) tablet Take 1,000 Units by mouth daily.    [provider]  clopidogrel (PLAVIX) 75 MG tablet Take 1  tablet (75 mg total) by mouth daily. 10/14/20   [name], MD  empagliflozin (JARDIANCE) 25 MG TABS tablet Take 25 mg by mouth daily. 07/08/20   [provider]  fosinopril ([name]) 40 MG tablet Take 1 tablet (40 mg total) by mouth daily. 10/14/20   [name], MD  furosemide (LASIX) 20 MG tablet Take 20-40 mg by mouth See admin instructions. Take 20 mg on Mon, Wed, Fri, and Sun. Take 40 mg on Tues, Thurs, and Sat    [provider]  insulin aspart (NOVOLOG FLEXPEN) 100 UNIT/ML FlexPen Inject 6 Units into the skin 2 (two) times daily with a meal. Breakfast and dinner 04/29/20   [name], MD  insulin glargine (LANTUS) 100 UNIT/ML Solostar Pen Inject  8 Units into the skin at bedtime. 10/14/20   [name], MD  LACTOBACILLUS PO Take 1 capsule by mouth every Monday, Wednesday, and Friday.    [provider]  loratadine (CLARITIN) 10 MG tablet Take 10 mg by mouth.    [provider]  megestrol (MEGACE ES) 625 MG/5ML suspension Take 5 mLs (625 mg total) by mouth daily. 02/01/21   Alla Feeling, NP  metoprolol tartrate (LOPRESSOR) 25 MG tablet Take 1 tablet (25 mg total) by mouth 2 (two) times daily. 10/14/20   [name], MD  Olopatadine HCl (PATADAY OP) Place 1 drop into both eyes daily as needed (allergies).    [provider]  ondansetron (ZOFRAN) 8 MG tablet Take 1 tablet (8 mg total) by mouth 2 (two) times daily as needed for refractory nausea / vomiting. Start on day 3 after chemo. 12/31/20   Truitt Merle, MD  pantoprazole (PROTONIX) 20 MG tablet TAKE 1 TABLET BY MOUTH EVERY DAY 01/11/21   [name], MD  Potassium 99 MG TABS Take 1 tablet (99 mg total) by mouth daily as needed (when you take 2 lasix pills). Patient taking differently: Take 1 tablet by mouth 3 (three) times a week. [name], and Sat 05/13/20   [name], MD  prochlorperazine (COMPAZINE) 10 MG tablet Take 1 tablet (10 mg total) by mouth every 6  (six) hours as needed (Nausea or vomiting). 12/31/20   Truitt Merle, MD  sertraline (ZOLOFT) 25 MG tablet Take 1 tablet (25 mg total) by mouth daily. 10/14/20   [name], MD    Allergies    Niacin  Review of Systems   Review of Systems  Constitutional:  Negative for chills and fever.  HENT:  Positive for trouble swallowing. Negative for ear pain and sore throat.   Eyes:  Negative for pain and visual disturbance.  Respiratory:  Positive for shortness of breath. Negative for cough.   Cardiovascular:  Negative for chest pain and palpitations.  Gastrointestinal:  Negative for abdominal pain and vomiting.  Genitourinary:  Negative for dysuria and hematuria.  Musculoskeletal:  Negative for arthralgias and back pain.  Skin:  Negative for color change and rash.  Neurological:  Negative for seizures and syncope.  All other systems reviewed and are negative.  Physical Exam Updated Vital Signs BP 133/67    Pulse 79    Temp 98.1 F (36.7 C) (Oral)    Resp 20    SpO2 100%   Physical Exam Vitals and nursing note reviewed.  Constitutional:      General: He is not in acute distress.    Appearance: He is well-developed. He is not toxic-appearing.  HENT:     Head: Normocephalic and atraumatic.     Mouth/Throat:     Mouth: Mucous membranes are dry.     Comments: Mucous membranes are dry Eyes:     Conjunctiva/sclera: Conjunctivae normal.  Cardiovascular:     Rate and Rhythm: Normal rate and regular rhythm.     Heart sounds: No murmur heard. Pulmonary:     Effort: Pulmonary effort is normal. No respiratory distress.     Breath sounds: Normal breath sounds. No wheezing, rhonchi or rales.  Abdominal:     General: There is no distension.     Palpations: Abdomen is soft.     Tenderness: There is no abdominal tenderness. There is no guarding.  Musculoskeletal:        General: No swelling.     Cervical back: Normal  range of motion and neck supple.  Skin:    General: Skin is warm and  dry.     Capillary Refill: Capillary refill takes less than 2 seconds.  Neurological:     General: No focal deficit present.     Mental Status: He is alert and oriented to person, place, and time.     Cranial Nerves: No cranial nerve deficit.     Sensory: No sensory deficit.     Motor: No weakness.  Psychiatric:        Mood and Affect: Mood normal.    ED Results / Procedures / Treatments   Labs (all labs ordered are listed, but only abnormal results are displayed) Labs Reviewed  CBC WITH DIFFERENTIAL/PLATELET - Abnormal; Notable for the following components:      Result Value   WBC 1.9 (*)    Hemoglobin 12.9 (*)    HCT 37.7 (*)    Platelets 125 (*)    All other components within normal limits  COMPREHENSIVE METABOLIC PANEL - Abnormal; Notable for the following components:   Glucose, Bld 193 (*)    BUN 32 (*)    Creatinine, Ser 1.32 (*)    Calcium 8.8 (*)    Total Protein 6.1 (*)    Albumin 3.2 (*)    GFR, Estimated 55 (*)    All other components within normal limits  LIPASE, BLOOD    EKG EKG Interpretation  Date/Time:  Thursday March 04 2021 17:34:49 EST Ventricular Rate:  84 PR Interval:  149 QRS Duration: 112 QT Interval:  519 QTC Calculation: 614 R Axis:   12 Text Interpretation: Sinus rhythm Probable left ventricular hypertrophy Anteroseptal infarct, old Prolonged QT interval Confirmed by Fredia Sorrow [phone]) on 03/04/2021 7:12:34 PM  Radiology DG Chest 2 View  Result Date: 03/04/2021 CLINICAL DATA:  Shortness of breath EXAM: CHEST - 2 VIEW COMPARISON:  CT chest done on 12/01/2020 FINDINGS: Cardiac size is within normal limits. There are no signs of pulmonary edema or focal pulmonary consolidation. There is no pleural effusion or pneumothorax. IMPRESSION: No active cardiopulmonary disease. Electronically Signed   By: Elmer Picker M.D.   On: 03/04/2021 18:11    Procedures Procedures   Medications Ordered in ED Medications  0.9 %  sodium  chloride infusion ( Intravenous New Bag/Given 03/04/21 1955)  sodium chloride 0.9 % bolus 500 mL (has no administration in time range)  sodium chloride 0.9 % bolus 500 mL (500 mLs Intravenous New Bag/Given 03/04/21 1947)    ED Course  I have reviewed the triage vital signs and the nursing notes.  Pertinent labs & imaging results that were available during my care of the patient were reviewed by me and considered in my medical decision making (see chart for details).    MDM Rules/Calculators/A&P                          Patient at rest very stable.  Apparently orthostatic at physician's office.  Labs are starting to come in.  White blood cell count 1.9 differential still pending.  Hemoglobin 12.9.  Platelets without any significant abnormality at 125.  The complete metabolic panel significant for BUN of 32 and a creatinine of 1.32 with a GFR 55.  Which is significantly different than his baseline just a few weeks ago.  Liver function test without significant abnormalities.  Blood sugar is 193 no significant electrolyte abnormalities.  Lipase is 20.  And regular chest x-ray  no active cardiopulmonary disease.  Patient originally received 500 cc bolus of IV fluids.  Will receive a second 500 cc.  We will continue 100 cc an hour for maintenance.  We will going get CT angio to rule out pulmonary embolus.  At least at rest patient has no hypoxia requiring admission.  CT chest without any acute findings.  White blood cell count 1.9.  But differential without significant abnormalities.  Hemoglobin is 12.9.  Electrolytes without significant changes other than BUN is 32 creatinine 1.32 as stated above this is a little higher than normal.  Patient feels better after some IV fluids.  And CT showed no evidence of pulmonary embolus.  We will go ahead and check orthostatic blood pressures.  Patient really does not ambulate much pretty much gets around with a wheelchair.  Still ambulating for oxygen saturation  probably not of much benefit.  Patient had orthostatics done after the fluid hydration feels much better.  Blood pressures were very stable.  Patient stable for discharge home.   Final Clinical Impression(s) / ED Diagnoses Final diagnoses:  Shortness of breath  Dyspnea on exertion  Dehydration    Rx / DC Orders ED Discharge Orders     None        Fredia Sorrow, MD 03/04/21 2024    Fredia Sorrow, MD 03/04/21 [id number]    Fredia Sorrow, MD 03/04/21 [id number]

## 2021-03-04 NOTE — Discharge Instructions (Signed)
Work-up CT angio chest without evidence of pulmonary embolus.  No evidence of pneumonia.  Patient did appear clinically dehydrated.  Patient hydrated IV fluids here and patient feels much better.  Patient had orthostatics checked and did fine and patient feels much better patient stable for discharge home follow-up with hematology oncology.  Encouraging drinking small amount of fluids frequently.

## 2021-03-04 NOTE — Assessment & Plan Note (Signed)
History of the same increase as of late.  Patient was able to go 3-5 steps with 1 step up with assistance and once he sat on exam table was extremely dyspneic and tachypneic.  Patient is undergoing active cancer treatment do have concern for possible PE given the amount of dyspnea he is experiencing.  EKG okay in office today did recommend ED evaluation to patient and daughter did give report to Psychologist, educational at [facility] ED.

## 2021-03-04 NOTE — Assessment & Plan Note (Signed)
Patient does have antiemetics at home according to patient and daughter he has difficulty intaking water due to nausea and not wanting to.  If he is able to get it then he is able to keep it down and does not vomit up.  Patient is also had marked weight loss

## 2021-03-04 NOTE — Patient Instructions (Signed)
Nice to see you today Given his presentation I feel like he could use some fluids and possible rule out a PE I will be able to follow up guys via the notes

## 2021-03-04 NOTE — Progress Notes (Signed)
Acute Office Visit  Subjective:    Patient ID: Jeffrey [name], male    DOB: 18-Jul-1940, 80 y.o.   MRN: [id number]  Chief Complaint  Patient presents with   wound on buttocks area    At least 3 to 4 weeks. Sore present right above anas area, pain present. Had some left over dressing for wounds from patient's wife and they applied to the area for patient. Not sure if this helped.     HPI Patient is in today for Wound  First noticed it approx 3-4 weeks ago. It is above the anus. He finished round of chemo last Monday radition last Friday for stomach and esopheal cancer.  DM Dr Heinz Knuckles for Oncology Dr. Darnell Level for family.  States having episodes of feeling weak, SHOB, and rapid breathing. Dizziness and lightheadness with position changes. States that he is peeing approx 5 times a day. This has been going on for at least a month but has worsend as of late. Does have a history of right sided paralysis due to prior CVA. Does sit in wheel chair most of the time  Does have a blood pressure machine at home. Daughter states she took it prior to today's office visit and it was lower than the office visit initial vital signs Carleigh at [facility] ED took report  Past Medical History:  Diagnosis Date   Anterior cerebral circulation hemorrhagic infarction [facility]) 11/24/2014   March, 2016, dominant left thalamic and left internal capsule ischemic infarct with resultant hemorrhagic transformation, secondary to small vessel disease. Resulted in right hemiparesis dysarthria and diplopia   //   readmission with aphasia July, 2016, this improved    Arthritis    CAD (coronary artery disease)    PCI distal RCA ...2004, residual 70% LAD   /   ...nuclear...03/2007...no ischemia.Marland KitchenMarland Kitchenpreserved LV /  nuclear...03/03/2009...inferior scar..no ischemia..EF 51%   Cyst of nasopharynx    per ENT Wilburn Cornelia   Dyslipidemia    takes Atorvastatin daily   GERD (gastroesophageal reflux disease)    takes Omeprazole  daily   Gout    takes Allopurinol daily and Colchicine as needed   HCAP (healthcare-associated pneumonia)    Hemiparesis affecting right side as late effect of cerebrovascular accident ([place]) 09/05/2014   History of colon polyps    HTN (hypertension)    takes Cardura,Metoprolol,[name],and Amlodipine daily   Internal hemorrhoids    Myocardial infarction [facility]) 23yrs ago   Peripheral edema    takes Lasix daily   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 [name])    Past Surgical History:  Procedure Laterality Date   BALLOON ANGIOPLASTY, ARTERY  1992, 2004   CAD, Dr. Ron Parker   CARDIAC CATHETERIZATION  2004   CATARACT EXTRACTION Bilateral 2010   COLONOSCOPY  04/03/2002   adenomatous polyp, int hemorrhoids   COLONOSCOPY  07/18/2007   normal (Dr. Fuller Plan)   COLONOSCOPY  09/26/2012   tubular adenoma, sm int hem, rpt 5 yrs Fuller Plan)   ELBOW SURGERY Right 1997   eye lids raised     NASAL SEPTUM SURGERY     POLYPECTOMY N/A 05/27/2013   Procedure: ENDOSCOPIC NASOPHARYNGEAL MASS;  Surgeon: [name], MD   ROTATOR CUFF REPAIR  09/2011   left,  with subacromial decompression   TONSILLECTOMY AND ADENOIDECTOMY      Family History  Problem Relation Age of Onset   Alcohol abuse Father    Heart attack Father    Cancer Sister 57       breast cancer   Heart attack Son    Coronary artery disease Neg Hx    Stroke Neg Hx    Diabetes Neg Hx    Colon cancer Neg Hx     Social History   Socioeconomic History   Marital status: Married    Spouse name: Not on file   Number of children: 2   Years of education: Bachelors   Highest education level: Not on file  Occupational History   Occupation: Music therapist: [employer]  Tobacco Use   Smoking status: Former    Smokeless tobacco: Never   Tobacco comments:    quit smoking at age 36  Vaping Use   Vaping Use: Never used  Substance and Sexual Activity   Alcohol use: No   Drug use: No   Sexual activity: Yes  Other Topics Concern   Not on file  Social History Narrative   Caffeine: 6-7 cups coffee   Lives with wife, no pets   2 grown children, 4 grandchildren   Occu: Artist   Edu: [employer]   Activity: not much   Diet: plenty of water, fruits and vegetables   Some care through [place]      Endo: Dr. Buddy Duty   Social Determinants of Health   Financial Resource Strain: Low Risk    Difficulty of Paying Living Expenses: Not hard at all  Food Insecurity: No Food Insecurity   Worried About Charity fundraiser in the Last Year: Never true   [place] in the Last Year: Never true  Transportation Needs: No Transportation Needs   Lack of Transportation (Medical): No   Lack of Transportation (Non-Medical): No  Physical Activity: Sufficiently Active   Days of Exercise per Week: 5 days   Minutes of Exercise per Session: 30 min  Stress: No Stress Concern Present   Feeling of Stress : Not at all  Social Connections: Not on file  Intimate Partner Violence: Not At Risk   Fear of Current or Ex-Partner: No   Emotionally Abused: No   Physically Abused: No   Sexually Abused: No    Outpatient Medications Prior to Visit  Medication Sig Dispense Refill   atorvastatin (LIPITOR) 80 MG tablet Take 80 mg by mouth at bedtime.     baclofen (LIORESAL) 10 MG tablet Take 10 mg by mouth 3 (three) times daily.     cholecalciferol (VITAMIN D3) 25 MCG (1000 UNIT) tablet Take 1,000 Units by mouth daily.     clopidogrel (PLAVIX) 75 MG tablet Take 1 tablet (75 mg total) by mouth daily.     empagliflozin (JARDIANCE) 25 MG TABS tablet Take 25 mg by mouth daily.     fosinopril ([name]) 40 MG tablet Take 1 tablet (40 mg total) by mouth daily.     furosemide (LASIX) 20 MG tablet Take 20-40 mg by  mouth See admin instructions. Take 20 mg on Mon, Wed, Fri, and Sun. Take 40 mg on Tues, Thurs, and Sat     insulin aspart (NOVOLOG FLEXPEN) 100 UNIT/ML FlexPen Inject 6 Units into the skin 2 (two) times daily with a meal. Breakfast and dinner     insulin glargine (LANTUS) 100 UNIT/ML Solostar  Pen Inject 8 Units into the skin at bedtime.     LACTOBACILLUS PO Take 1 capsule by mouth every Monday, Wednesday, and Friday.     loratadine (CLARITIN) 10 MG tablet Take 10 mg by mouth.     megestrol (MEGACE ES) 625 MG/5ML suspension Take 5 mLs (625 mg total) by mouth daily. 150 mL 2   metoprolol tartrate (LOPRESSOR) 25 MG tablet Take 1 tablet (25 mg total) by mouth 2 (two) times daily.     Olopatadine HCl (PATADAY OP) Place 1 drop into both eyes daily as needed (allergies).     ondansetron (ZOFRAN) 8 MG tablet Take 1 tablet (8 mg total) by mouth 2 (two) times daily as needed for refractory nausea / vomiting. Start on day 3 after chemo. 30 tablet 1   pantoprazole (PROTONIX) 20 MG tablet TAKE 1 TABLET BY MOUTH EVERY DAY 30 tablet 3   Potassium 99 MG TABS Take 1 tablet (99 mg total) by mouth daily as needed (when you take 2 lasix pills). (Patient taking differently: Take 1 tablet by mouth 3 (three) times a week. Tues, Thurs, and Sat)     prochlorperazine (COMPAZINE) 10 MG tablet Take 1 tablet (10 mg total) by mouth every 6 (six) hours as needed (Nausea or vomiting). 30 tablet 1   sertraline (ZOLOFT) 25 MG tablet Take 1 tablet (25 mg total) by mouth daily.     mupirocin cream (BACTROBAN) 2 % Apply 1 application topically 2 (two) times daily. (Patient not taking: Reported on 01/26/2021) 15 g 0   No facility-administered medications prior to visit.    Allergies  Allergen Reactions   Niacin Other (See Comments)    flushed skin/rash     Review of Systems  Constitutional:  Negative for chills and fever.  Respiratory:  Positive for shortness of breath. Negative for cough.   Cardiovascular:  Negative for  chest pain and leg swelling.  Gastrointestinal:  Positive for nausea. Negative for abdominal pain, diarrhea and vomiting.  Genitourinary:  Negative for decreased urine volume, dysuria, frequency and hematuria.  Skin:  Positive for wound.  Neurological:  Positive for dizziness, weakness and light-headedness. Negative for headaches.      Objective:    Physical Exam Vitals and nursing note reviewed.  Constitutional:      Appearance: Normal appearance.  Cardiovascular:     Rate and Rhythm: Normal rate and regular rhythm.  Pulmonary:     Effort: Pulmonary effort is normal.     Breath sounds: Normal breath sounds.     Comments: Patient became severely short of breath with minimal activity. Patient took approx 3-5 steps and stepped up on step of exam table and then became dyspneic and tachypneic .  Skin:    Findings: Lesion present.     Comments: Wound to the left medial superior buttock. Does have a scab on it and surrounding erythema.   Neurological:     Mental Status: He is alert. Mental status is at baseline.  Psychiatric:        Mood and Affect: Mood normal.        Behavior: Behavior normal.        Thought Content: Thought content normal.        Judgment: Judgment normal.      BP (!) 90/58    Pulse 87    Temp 97.9 F (36.6 C)    Resp 16    Ht 5\' 8"  (1.727 m)    Wt 137 lb (62.1 kg)  SpO2 96%    BMI 20.83 kg/m  Wt Readings from Last 3 Encounters:  03/04/21 137 lb (62.1 kg)  02/22/21 148 lb 11.2 oz (67.4 kg)  02/15/21 152 lb 4.8 oz (69.1 kg)   Orthostatic Vitals for the past 48 hrs (Last 6 readings):  Patient Position Orthostatic BP Orthostatic Pulse  03/04/21 1536 Supine 102/68 79  03/04/21 1537 Sitting (!) 88/56 82  03/04/21 1538 Standing (!) 80/52 96     Health Maintenance Due  Topic Date Due   OPHTHALMOLOGY EXAM  11/30/2019    There are no preventive care reminders to display for this patient.   Lab Results  Component Value Date   TSH 1.00 10/03/2019   Lab  Results  Component Value Date   WBC 2.0 (L) 02/22/2021   HGB 12.8 (L) 02/22/2021   HCT 37.2 (L) 02/22/2021   MCV 87.5 02/22/2021   PLT 102 (L) 02/22/2021   Lab Results  Component Value Date   NA 141 02/22/2021   K 3.2 (L) 02/22/2021   CO2 22 02/22/2021   GLUCOSE 61 (L) 02/22/2021   BUN 16 02/22/2021   CREATININE 0.84 02/22/2021   BILITOT 0.7 02/22/2021   ALKPHOS 71 02/22/2021   AST 17 02/22/2021   ALT 21 02/22/2021   PROT 5.8 (L) 02/22/2021   ALBUMIN 3.2 (L) 02/22/2021   CALCIUM 8.6 (L) 02/22/2021   ANIONGAP 8 02/22/2021   GFR 73.29 11/30/2020   Lab Results  Component Value Date   CHOL 153 04/08/2020   Lab Results  Component Value Date   HDL 35.80 (L) 04/08/2020   Lab Results  Component Value Date   LDLCALC 82 04/08/2020   Lab Results  Component Value Date   TRIG 179.0 (H) 04/08/2020   Lab Results  Component Value Date   CHOLHDL 4 04/08/2020   Lab Results  Component Value Date   HGBA1C 7.4 (H) 10/14/2020       Assessment & Plan:   Problem List Items Addressed This Visit       Cardiovascular and Mediastinum   Hypotension due to hypovolemia    Patient had an appreciable drop of 22 points systolically and increase of  pulse at 30 bpm.  Patient is having difficulty getting fluids into his body and he does not feel like it and has associated nausea.  Given patient is having difficulty increasing oral intake symptomatic orthostatic blood pressures recommend further evaluation in ED for at minimum fluid replacements but for additional labs and to help rule out PE.        Other   DOE (dyspnea on exertion) - Primary    History of the same increase as of late.  Patient was able to go 3-5 steps with 1 step up with assistance and once he sat on exam table was extremely dyspneic and tachypneic.  Patient is undergoing active cancer treatment do have concern for possible PE given the amount of dyspnea he is experiencing.  EKG okay in office today did recommend ED  evaluation to patient and daughter did give report to Psychologist, educational at [facility].      Relevant Orders   EKG 12-Lead (Completed)   Dizziness   Relevant Orders   Orthostatic vital signs   Nausea    Patient does have antiemetics at home according to patient and daughter he has difficulty intaking water due to nausea and not wanting to.  If he is able to get it then he is able to keep it  down and does not vomit up.  Patient is also had marked weight loss        No orders of the defined types were placed in this encounter.  This visit occurred during the SARS-CoV-2 public health emergency.  Safety protocols were in place, including screening questions prior to the visit, additional usage of staff PPE, and extensive cleaning of exam room while observing appropriate contact time as indicated for disinfecting solutions.   Romilda Garret, NP

## 2021-03-04 NOTE — Progress Notes (Signed)
Sent faxed over to [facility] for pt per pt's daughter's request for home health.

## 2021-03-04 NOTE — Assessment & Plan Note (Signed)
Patient had an appreciable drop of 22 points systolically and increase of  pulse at 30 bpm.  Patient is having difficulty getting fluids into his body and he does not feel like it and has associated nausea.  Given patient is having difficulty increasing oral intake symptomatic orthostatic blood pressures recommend further evaluation in ED for at minimum fluid replacements but for additional labs and to help rule out PE.

## 2021-03-05 ENCOUNTER — Other Ambulatory Visit: Payer: Self-pay | Admitting: Nurse Practitioner

## 2021-03-05 DIAGNOSIS — L89329 Pressure ulcer of left buttock, unspecified stage: Secondary | ICD-10-CM

## 2021-03-05 MED ORDER — SILVER SULFADIAZINE 1 % EX CREA: 1.0000 "application " | TOPICAL_CREAM | Freq: Every day | CUTANEOUS | 0 refills | Status: DC

## 2021-03-05 NOTE — Progress Notes (Signed)
Called and soke with daughter. They will be having home health come out from oncology office. She is to apply cream and can cover with a bandaid. She is to monitor the sore if it does not improve she will need to be re-evaluated. She acknowledged

## 2021-03-16 ENCOUNTER — Other Ambulatory Visit: Payer: Self-pay

## 2021-03-16 ENCOUNTER — Encounter: Payer: Self-pay | Admitting: Hematology

## 2021-03-16 ENCOUNTER — Inpatient Hospital Stay: Payer: PPO

## 2021-03-16 ENCOUNTER — Inpatient Hospital Stay: Payer: PPO | Attending: Nurse Practitioner | Admitting: Hematology

## 2021-03-16 VITALS — BP 127/65 | HR 63 | Temp 98.1°F | Resp 19 | Ht 68.0 in | Wt 137.5 lb

## 2021-03-16 DIAGNOSIS — I10 Essential (primary) hypertension: Secondary | ICD-10-CM | POA: Diagnosis not present

## 2021-03-16 DIAGNOSIS — E119 Type 2 diabetes mellitus without complications: Secondary | ICD-10-CM | POA: Diagnosis not present

## 2021-03-16 DIAGNOSIS — Z79899 Other long term (current) drug therapy: Secondary | ICD-10-CM | POA: Insufficient documentation

## 2021-03-16 DIAGNOSIS — I252 Old myocardial infarction: Secondary | ICD-10-CM

## 2021-03-16 DIAGNOSIS — I2510 Atherosclerotic heart disease of native coronary artery without angina pectoris: Secondary | ICD-10-CM

## 2021-03-16 DIAGNOSIS — Z8673 Personal history of transient ischemic attack (TIA), and cerebral infarction without residual deficits: Secondary | ICD-10-CM

## 2021-03-16 DIAGNOSIS — J439 Emphysema, unspecified: Secondary | ICD-10-CM | POA: Insufficient documentation

## 2021-03-16 DIAGNOSIS — R0602 Shortness of breath: Secondary | ICD-10-CM

## 2021-03-16 DIAGNOSIS — C160 Malignant neoplasm of cardia: Secondary | ICD-10-CM | POA: Insufficient documentation

## 2021-03-16 DIAGNOSIS — C158 Malignant neoplasm of overlapping sites of esophagus: Secondary | ICD-10-CM

## 2021-03-16 DIAGNOSIS — I700 Atherosclerosis of aorta: Secondary | ICD-10-CM | POA: Insufficient documentation

## 2021-03-16 DIAGNOSIS — E785 Hyperlipidemia, unspecified: Secondary | ICD-10-CM

## 2021-03-16 DIAGNOSIS — C155 Malignant neoplasm of lower third of esophagus: Secondary | ICD-10-CM

## 2021-03-16 DIAGNOSIS — I119 Hypertensive heart disease without heart failure: Secondary | ICD-10-CM

## 2021-03-16 LAB — CMP (CANCER CENTER ONLY)
ALT: 32 U/L (ref 0–44)
AST: 21 U/L (ref 15–41)
Albumin: 3.2 g/dL — ABNORMAL LOW (ref 3.5–5.0)
Alkaline Phosphatase: 78 U/L (ref 38–126)
Anion gap: 7 (ref 5–15)
BUN: 28 mg/dL — ABNORMAL HIGH (ref 8–23)
CO2: 24 mmol/L (ref 22–32)
Calcium: 9 mg/dL (ref 8.9–10.3)
Chloride: 108 mmol/L (ref 98–111)
Creatinine: 1.09 mg/dL (ref 0.61–1.24)
GFR, Estimated: >60 mL/min (ref >60)
Glucose, Bld: 168 mg/dL — ABNORMAL HIGH (ref 70–99)
Potassium: 3.5 mmol/L (ref 3.5–5.1)
Sodium: 139 mmol/L (ref 135–145)
Total Bilirubin: 0.6 mg/dL (ref 0.3–1.2)
Total Protein: 5.8 g/dL — ABNORMAL LOW (ref 6.5–8.1)

## 2021-03-16 LAB — CBC WITH DIFFERENTIAL (CANCER CENTER ONLY)
Abs Immature Granulocytes: 0.02 10*3/uL (ref 0.00–0.07)
Basophils Absolute: 0 10*3/uL (ref 0.0–0.1)
Basophils Relative: 0 %
Eosinophils Absolute: 0 10*3/uL (ref 0.0–0.5)
Eosinophils Relative: 0 %
HCT: 34.7 % — ABNORMAL LOW (ref 39.0–52.0)
Hemoglobin: 12.4 g/dL — ABNORMAL LOW (ref 13.0–17.0)
Immature Granulocytes: 0 %
Lymphocytes Relative: 12 %
Lymphs Abs: 0.6 10*3/uL — ABNORMAL LOW (ref 0.7–4.0)
MCH: 30.7 pg (ref 26.0–34.0)
MCHC: 35.7 g/dL (ref 30.0–36.0)
MCV: 85.9 fL (ref 80.0–100.0)
Monocytes Absolute: 0.5 10*3/uL (ref 0.1–1.0)
Monocytes Relative: 9 %
Neutro Abs: 4.2 10*3/uL (ref 1.7–7.7)
Neutrophils Relative %: 79 %
Platelet Count: 214 10*3/uL (ref 150–400)
RBC: 4.04 MIL/uL — ABNORMAL LOW (ref 4.22–5.81)
RDW: 16.7 % — ABNORMAL HIGH (ref 11.5–15.5)
WBC Count: 5.4 10*3/uL (ref 4.0–10.5)
nRBC: 0 % (ref 0.0–0.2)

## 2021-03-16 LAB — CEA (IN HOUSE-CHCC): CEA (CHCC-In House): 3.05 ng/mL (ref 0.00–5.00)

## 2021-03-16 NOTE — Progress Notes (Signed)
° °                                                                                                                                                          °  Patient Name: Jeffrey [name] MRN: [id number] DOB: 22-Aug-1940 Referring Physician: Truitt Merle (Profile Not Attached) Date of Service: 02/26/2021 [facility], [place]                                                        End Of Treatment Note  Diagnoses: C15.8-Malignant neoplasm of overlapping sites of esophagus  Cancer Staging: At least Stage IIA, [id number] Adenocarcinoma of the GE junction  Intent: Curative  Radiation Treatment Dates: 01/18/2021 through 02/26/2021 Site Technique Total Dose (Gy) Dose per Fx (Gy) Completed Fx Beam Energies  Esophagus: Esoph IMRT 45/45 1.8 25/25 6X  Esophagus: Esoph_Bst IMRT 5.4/5.4 1.8 3/3 6X   Narrative: The patient tolerated radiation therapy relatively well. He developed fatigue and dysphagia during therapy.  Plan: The patient will receive a call in about one month from the radiation oncology department. He will continue follow up with Dr. Burr Medico as well but surgery is not expected.   ________________________________________________    Carola Rhine, PAC

## 2021-03-16 NOTE — Progress Notes (Signed)
[place]   [phone] [phone]   Clinic Follow up Note   Patient Care Team: [name], MD as PCP - General (Family Medicine) Jerline Pain, MD as PCP - Cardiology (Cardiology) Delia Chimes, NP (Inactive) as Nurse Practitioner (Nurse Practitioner) Ladene Artist, MD as Consulting Physician (Gastroenterology) Alla Feeling, NP as Nurse Practitioner (Nurse Practitioner) Truitt Merle, MD as Consulting Physician (Hematology) Truitt Merle, MD as Consulting Physician (Oncology)  Date of Service:  03/16/2021  CHIEF COMPLAINT: f/u of esophageal cancer  CURRENT THERAPY:  PENDING Nivo, to start 03/31/21  ASSESSMENT & PLAN:  Jeffrey [name] is a 81 y.o. male with   1. Adenocarcinoma of the GE junction, cTX N1 M0, HER2 negative, MMR normal, PD-L1 CPS 3% -He presented with 6-8 months of dysphagia and 10 pounds weight loss and a palpable right cervical lymph node. Initial CT neck 10/22/20 showed 2.8 cm LN, concerning for metastatic disease.  -CT 12/08/20 showed suspicious mass at GE junction and prominent gastrohepatic LNs. EGD 12/17/20 by Dr. Fuller Plan showed 5 cm distal esophageal mass, biopsy confirmed adenocarcinoma with signet ring features. HER 2 negative -PET scan 12/30/20 showed FDG-avid mass at the GE junction and mild uptake with borderline gastrohepatic ligament lymph nodes, no distant metastasis. -He began concurrent chemo RT with weekly carbo/Taxol 01/18/21, C1 carbo was dose reduced to AUC 1.5 for age and co-morbidities. He tolerated moderately well with fatigue, decreased appetite/taste change, nausea, and constipation. -cervical lymph node biopsy on 01/29/21 showed atypical cells.  -he completed chemo on 12/19 and radiation on 02/26/21. -he is recovering slowly from chemoRT. He is not a candidate for consolidation chemo FOLFOX. I do still recommend Nivo for one year to complete his treatment.  Benefit and potential side effects, especially  pneumonitis, thyroid dysfunction, autoimmune related disorders, other endocrine disorders, fatigue, skin rash, arthralgia, it etc. were discussed with patient and his daughter in detail, they agree to try. Plan to start in 2-3 weeks    2. Symptom Management: Weight loss, Decreased appetite/Taste change, shortness of breath -Continue follow-up with dietitian -started on megace 02/01/21, which has helped. -he has lost about 20 lbs in the last month. I will have our dietician reach back out to him. -he reports worsening SOB since completing radiation. CT angio chest in the ED on 03/04/21 was negative.   3. Goals of care -He understands if work-up confirms metastatic disease, it will not be curable but still treatable, treatment will be palliative   4. Comorbidities: CVA with residual right-sided weakness, history of MI, CAD, HTN, DM, HL -Patient lives alone, his daughter checks on him frequently to help with housework and food prep -He is limited in his ADLs due to residual right-sided weakness from CVA -He ambulates with support, can bathe and dress with difficulty     PLAN: -nutrition f/u in 1-2 weeks -lab, f/u, and Nivo in 2-3 weeks   No problem-specific Assessment & Plan notes found for this encounter.   SUMMARY OF ONCOLOGIC HISTORY: Oncology History Overview Note  Cancer Staging Esophageal cancer [facility]) Staging form: Esophagus - Adenocarcinoma, AJCC 8th Edition - Clinical stage from 12/31/2020: Stage IIA (cT1, cN1, cM0, GX) - Unsigned    Esophageal cancer ([place])  10/22/2020 Imaging   CT neck IMPRESSION: 2.8 cm right level 2A lymph node, which appears to be centrally necrotic, concerning for metastatic disease. No additional lymphadenopathy or other acute process in the neck.     11/30/2020 Imaging   Barium swallow  FINDINGS: Limitations on positioning due to sequelae of stroke. The patient swallowed barium without difficulty. No aspiration. There is no mass or stricture.  Normal motility. There is no hiatal hernia. No spontaneous gastroesophageal reflux. Barium pill was swallowed and passed without difficulty.   IMPRESSION: No significant abnormality.   12/08/2020 Imaging   CT chest abdomen with contrast IMPRESSION: 1. Mass is suspected just at the level of the GE junction. Suspicious for primary esophageal neoplasm. Further evaluation with endoscopy is advised. 2. Prominent perigastric lymph nodes are identified, suspicious for metastatic adenopathy. 3. Gallstone. 4. Coronary artery calcifications. 5. Aortic Atherosclerosis (ICD10-I70.0) and Emphysema (ICD10-J43.9). 6. These results will be called to the ordering clinician or representative by the Radiologist Assistant, and communication documented in the PACS or [employer].     12/17/2020 Procedure   EGD by Dr. Fuller Plan - A medium-sized, fungating mass with contact bleeding was found in the distal esophagus, 36 cm from the incisors extending to 41 cm in the cardia. The mass was partially obstructing and circumferential.   12/17/2020 Initial Biopsy   Diagnosis Esophagus, biopsy, distal and gastric cardia mass - ADENOCARCINOMA WITH SIGNET RING FEATURES.   12/19/2020 Initial Diagnosis   Esophageal cancer ([place])   12/30/2020 PET scan   IMPRESSION: 1. Mass at the level of the GE junction and proximal stomach is FDG avid within SUV max of 7.62. Findings compatible with primary gastric neoplasm. 2. There is mild FDG uptake associated with borderline gastrohepatic ligament lymph nodes which have an SUV max of 2.97. Equivocal for nodal metastasis. 3. No signs of distant metastatic disease. 4. Aortic Atherosclerosis (ICD10-I70.0) and Emphysema (ICD10-J43.9). Coronary artery calcifications. 5. Gallstones. 6. Prostate gland enlargement.   01/08/2021 Imaging   EXAM: ULTRASOUND OF HEAD/NECK SOFT TISSUES  IMPRESSION: Sonographic evaluation of the right-side of the neck confirms a minimally complex  cystic lesion with broad differential considerations including cystic/necrotic cervical lymphadenopathy (favored given presence of an additional smaller apparently partially cystic right cervical lymph node), compatible with the findings on preceding PET-CT.   This minimally complex cystic structure/mass could undergo ultrasound-guided biopsy/aspiration as indicated.   01/18/2021 -  Chemotherapy   Patient is on Treatment Plan : ESOPHAGUS Carboplatin/PACLitaxel weekly x 6 weeks with XRT       01/29/2021 Pathology Results   A. RIGHT, SUBMANDIBULAR CYST, FINE NEEDLE  ASPIRATION:   FINAL MICROSCOPIC DIAGNOSIS:  - Atypical cells present DIAGNOSTIC COMMENTS:  There are clusters of epithelioid cells with nuclear grooves and  pseudoinclusions admixed with pigmented histiocytes.  There is  insufficient material for ancillary studies.  Excisional biopsy may be  of interest.      INTERVAL HISTORY:  Jeffrey [name] is here for a follow up of esophageal cancer. He was last seen by me on 02/22/21. He presents to the clinic accompanied by his daughter. He reports worsening shortness of breath since completing radiation, causing difficulty walking around. His daughter adds he sleeps a lot and can fall asleep sitting up. He reports his taste is improving, but his daughter notes he continues to not eat a lot. His daughter reports her mother recently passed related to dementia.   All other systems were reviewed with the patient and are negative.  MEDICAL HISTORY:  Past Medical History:  Diagnosis Date   Anterior cerebral circulation hemorrhagic infarction [facility]) 11/24/2014   March, 2016, dominant left thalamic and left internal capsule ischemic infarct with resultant hemorrhagic transformation, secondary to small vessel disease. Resulted in right hemiparesis dysarthria and diplopia   //  readmission with aphasia July, 2016, this improved    Arthritis    CAD (coronary artery disease)    PCI  distal RCA ...2004, residual 70% LAD   /   ...nuclear...03/2007...no ischemia.Marland KitchenMarland Kitchenpreserved LV /  nuclear...03/03/2009...inferior scar..no ischemia..EF 51%   Cyst of nasopharynx    per ENT Wilburn Cornelia   Dyslipidemia    takes Atorvastatin daily   GERD (gastroesophageal reflux disease)    takes Omeprazole daily   Gout    takes Allopurinol daily and Colchicine as needed   HCAP (healthcare-associated pneumonia)    Hemiparesis affecting right side as late effect of cerebrovascular accident ([place]) 09/05/2014   History of colon polyps    HTN (hypertension)    takes Cardura,Metoprolol,[name],and Amlodipine daily   Internal hemorrhoids    Myocardial infarction [facility]) 33yr ago   Peripheral edema    takes Lasix daily   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    SURGICAL HISTORY: Past Surgical History:  Procedure Laterality Date   BALLOON ANGIOPLASTY, ARTERY  1992, 2004   CAD, Dr. KRon Parker  CARDIAC CATHETERIZATION  2004   CATARACT EXTRACTION Bilateral 2010   COLONOSCOPY  04/03/2002   adenomatous polyp, int hemorrhoids   COLONOSCOPY  07/18/2007   normal (Dr. SFuller Plan   COLONOSCOPY  09/26/2012   tubular adenoma, sm int hem, rpt 5 yrs (Fuller Plan   ELBOW SURGERY Right 1997   eye lids raised     NASAL SEPTUM SURGERY     POLYPECTOMY N/A 05/27/2013   Procedure: ENDOSCOPIC NASOPHARYNGEAL MASS;  Surgeon: DJerrell Belfast MD   ROTATOR CUFF REPAIR  09/2011   left, with subacromial decompression   TONSILLECTOMY AND ADENOIDECTOMY      I have reviewed the social history and family history with the patient and they are unchanged from previous note.  ALLERGIES:  is allergic to niacin.  MEDICATIONS:  Current Outpatient Medications  Medication Sig Dispense Refill   atorvastatin  (LIPITOR) 80 MG tablet Take 80 mg by mouth at bedtime.     baclofen (LIORESAL) 10 MG tablet Take 10 mg by mouth 3 (three) times daily.     cholecalciferol (VITAMIN D3) 25 MCG (1000 UNIT) tablet Take 1,000 Units by mouth daily.     clopidogrel (PLAVIX) 75 MG tablet Take 1 tablet (75 mg total) by mouth daily.     empagliflozin (JARDIANCE) 25 MG TABS tablet Take 25 mg by mouth daily.     fosinopril ([name]) 40 MG tablet Take 1 tablet (40 mg total) by mouth daily.     furosemide (LASIX) 20 MG tablet Take 20-40 mg by mouth See admin instructions. Take 20 mg on Mon, Wed, Fri, and Sun. Take 40 mg on Tues, Thurs, and Sat     insulin aspart (NOVOLOG FLEXPEN) 100 UNIT/ML FlexPen Inject 6 Units into the skin 2 (two) times daily with a meal. Breakfast and dinner     insulin glargine (LANTUS) 100 UNIT/ML Solostar Pen Inject 8 Units into the skin at bedtime.     LACTOBACILLUS PO Take 1 capsule by mouth every Monday, Wednesday, and Friday.     loratadine (CLARITIN) 10 MG tablet Take 10 mg by mouth.     megestrol (MEGACE ES) 625 MG/5ML suspension Take 5 mLs (625 mg total)  by mouth daily. 150 mL 2   metoprolol tartrate (LOPRESSOR) 25 MG tablet Take 1 tablet (25 mg total) by mouth 2 (two) times daily.     Olopatadine HCl (PATADAY OP) Place 1 drop into both eyes daily as needed (allergies).     ondansetron (ZOFRAN) 8 MG tablet Take 1 tablet (8 mg total) by mouth 2 (two) times daily as needed for refractory nausea / vomiting. Start on day 3 after chemo. 30 tablet 1   pantoprazole (PROTONIX) 20 MG tablet TAKE 1 TABLET BY MOUTH EVERY DAY 30 tablet 3   Potassium 99 MG TABS Take 1 tablet (99 mg total) by mouth daily as needed (when you take 2 lasix pills). (Patient taking differently: Take 1 tablet by mouth 3 (three) times a week. Tues, Thurs, and Sat)     prochlorperazine (COMPAZINE) 10 MG tablet Take 1 tablet (10 mg total) by mouth every 6 (six) hours as needed (Nausea or vomiting). 30 tablet 1   sertraline (ZOLOFT)  25 MG tablet Take 1 tablet (25 mg total) by mouth daily.     silver sulfADIAZINE (SILVADENE) 1 % cream Apply 1 application topically daily. To sore on buttocks daily 50 g 0   No current facility-administered medications for this visit.    PHYSICAL EXAMINATION: ECOG PERFORMANCE STATUS: 3 - Symptomatic, >50% confined to bed  Vitals:   03/16/21 1126  BP: 127/65  Pulse: 63  Resp: 19  Temp: 98.1 F (36.7 C)  SpO2: 100%   Wt Readings from Last 3 Encounters:  03/16/21 137 lb 8 oz (62.4 kg)  03/04/21 137 lb (62.1 kg)  02/22/21 148 lb 11.2 oz (67.4 kg)     GENERAL:alert, no distress and comfortable SKIN: skin color normal, no rashes or significant lesions EYES: normal, Conjunctiva are pink and non-injected, sclera clear  NEURO: alert & oriented x 3 with fluent speech  LABORATORY DATA:  I have reviewed the data as listed CBC Latest Ref Rng & Units 03/16/2021 03/04/2021 02/22/2021  WBC 4.0 - 10.5 K/uL 5.4 1.9(L) 2.0(L)  Hemoglobin 13.0 - 17.0 g/dL 12.4(L) 12.9(L) 12.8(L)  Hematocrit 39.0 - 52.0 % 34.7(L) 37.7(L) 37.2(L)  Platelets 150 - 400 K/uL 214 125(L) 102(L)     CMP Latest Ref Rng & Units 03/16/2021 03/04/2021 02/22/2021  Glucose 70 - 99 mg/dL 168(H) 193(H) 61(L)  BUN 8 - 23 mg/dL 28(H) 32(H) 16  Creatinine 0.61 - 1.24 mg/dL 1.09 1.32(H) 0.84  Sodium 135 - 145 mmol/L 139 135 141  Potassium 3.5 - 5.1 mmol/L 3.5 3.5 3.2(L)  Chloride 98 - 111 mmol/L 108 106 111  CO2 22 - 32 mmol/L _0 Calcium 8.9 - 10.3 mg/dL 9.0 8.8(L) 8.6(L)  Total Protein 6.5 - 8.1 g/dL 5.8(L) 6.1(L) 5.8(L)  Total Bilirubin 0.3 - 1.2 mg/dL 0.6 0.8 0.7  Alkaline Phos 38 - 126 U/L 78 64 71  AST 15 - 41 U/L _1 ALT 0 - 44 U/L 32 25 21      RADIOGRAPHIC STUDIES: I have personally reviewed the radiological images as listed and agreed with the findings in the report. No results found.    No orders of the defined types were placed in this encounter.  All questions were answered. The  patient knows to call the clinic with any problems, questions or concerns. No barriers to learning was detected. The total time spent in the appointment was 40 minutes.     Truitt Merle, MD 03/16/2021   I, Wilburn Mylar,  am acting as scribe for Truitt Merle, MD.   I have reviewed the above documentation for accuracy and completeness, and I agree with the above.

## 2021-03-16 NOTE — Progress Notes (Signed)
Home health referral has been faxed to [facility] health and confirmation fax has been sent from [phone].

## 2021-03-17 ENCOUNTER — Encounter: Payer: Self-pay | Admitting: Hematology

## 2021-03-22 ENCOUNTER — Other Ambulatory Visit: Payer: Self-pay

## 2021-03-22 ENCOUNTER — Ambulatory Visit (INDEPENDENT_AMBULATORY_CARE_PROVIDER_SITE_OTHER): Payer: PPO | Admitting: Nurse Practitioner

## 2021-03-22 VITALS — BP 104/52 | HR 80 | Temp 98.1°F | Resp 12 | Ht 68.0 in | Wt 146.2 lb

## 2021-03-22 DIAGNOSIS — T148XXA Other injury of unspecified body region, initial encounter: Secondary | ICD-10-CM | POA: Diagnosis not present

## 2021-03-22 DIAGNOSIS — R296 Repeated falls: Secondary | ICD-10-CM | POA: Insufficient documentation

## 2021-03-22 DIAGNOSIS — W19XXXA Unspecified fall, initial encounter: Secondary | ICD-10-CM | POA: Diagnosis not present

## 2021-03-22 LAB — CBC WITH DIFFERENTIAL/PLATELET
Basophils Absolute: 0 10*3/uL (ref 0.0–0.1)
Basophils Relative: 0.5 % (ref 0.0–3.0)
Eosinophils Absolute: 0 10*3/uL (ref 0.0–0.7)
Eosinophils Relative: 0.9 % (ref 0.0–5.0)
HCT: 35.6 % — ABNORMAL LOW (ref 39.0–52.0)
Hemoglobin: 12.2 g/dL — ABNORMAL LOW (ref 13.0–17.0)
Lymphocytes Relative: 12.6 % (ref 12.0–46.0)
Lymphs Abs: 0.6 10*3/uL — ABNORMAL LOW (ref 0.7–4.0)
MCHC: 34.1 g/dL (ref 30.0–36.0)
MCV: 89.2 fl (ref 78.0–100.0)
Monocytes Absolute: 0.5 10*3/uL (ref 0.1–1.0)
Monocytes Relative: 10.2 % (ref 3.0–12.0)
Neutro Abs: 3.7 10*3/uL (ref 1.4–7.7)
Neutrophils Relative %: 75.8 % (ref 43.0–77.0)
Platelets: 177 10*3/uL (ref 150.0–400.0)
RBC: 3.99 Mil/uL — ABNORMAL LOW (ref 4.22–5.81)
RDW: 18.9 % — ABNORMAL HIGH (ref 11.5–15.5)
WBC: 4.9 10*3/uL (ref 4.0–10.5)

## 2021-03-22 NOTE — Patient Instructions (Signed)
Nice to see you today. Keep an eye on it if the area spreads (gets bigger), start having fever or chills be re evaluated. I will be in touch with the lab results once I have it. Follow up if symptoms fail to improve or get worse

## 2021-03-22 NOTE — Progress Notes (Signed)
Cardiology Office Note    Date:  03/23/2021   ID:  Jeffrey [name], DOB September 27, 1940, MRN [id number]   PCP:  [name], [place]  Cardiologist:  Candee Furbish, MD   Advanced Practice Provider:  No care team member to display Electrophysiologist:  None   [phone]   No chief complaint on file.   History of Present Illness:  Jeffrey [name] is a 81 y.o. male with history of CAD previous PCI 2004, normal NST 2010, CVA 05/2014 and 2017 with hemorrhagic transformation with right sided hemiparesis.  No atrial fibrillation.  Previously had been on Plavix, HTN, HLD, DM.  Patient saw Dr. Marlou Porch 09/2019 and doing well. He is now being treated for distal esophageal CA with local mets finished chemo and radiation.   In ED 03/04/21 for dyspnea and orthostatic hypotension. Crt 1.32 treated with IV fluids, CT negative PE.  Patient comes in with his daughter. He is supposed to start Immunotherapy -Nivo next week and oncology wanted him checked. Wasn't eating much but starting to eat better. Has lost from 193 down to 137 but 141 today. Denies chest pain, dyspnea, palpitations, dizziness, edema.  Past Medical History:  Diagnosis Date   Anterior cerebral circulation hemorrhagic infarction [name]) 11/24/2014   March, 2016, dominant left thalamic and left internal capsule ischemic infarct with resultant hemorrhagic transformation, secondary to small vessel disease. Resulted in right hemiparesis dysarthria and diplopia   //   readmission with aphasia July, 2016, this improved    Arthritis    CAD (coronary artery disease)    PCI distal RCA ...2004, residual 70% LAD   /   ...nuclear...03/2007...no ischemia.Marland KitchenMarland Kitchenpreserved LV /  nuclear...03/03/2009...inferior scar..no ischemia..EF 51%   Cyst of nasopharynx    per ENT Wilburn Cornelia   Dyslipidemia    takes Atorvastatin daily   GERD (gastroesophageal reflux disease)    takes Omeprazole daily   Gout    takes Allopurinol daily  and Colchicine as needed   HCAP (healthcare-associated pneumonia)    Hemiparesis affecting right side as late effect of cerebrovascular accident ([place]) 09/05/2014   History of colon polyps    HTN (hypertension)    takes Cardura,Metoprolol,[name],and Amlodipine daily   Internal hemorrhoids    Myocardial infarction [facility]) 50yrs ago   Peripheral edema    takes Lasix daily   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 [name])    Past Surgical History:  Procedure Laterality Date   BALLOON ANGIOPLASTY, ARTERY  1992, 2004   CAD, Dr. Ron Parker   CARDIAC CATHETERIZATION  2004   CATARACT EXTRACTION Bilateral 2010   COLONOSCOPY  04/03/2002   adenomatous polyp, int hemorrhoids   COLONOSCOPY  07/18/2007   normal (Dr. Fuller Plan)   COLONOSCOPY  09/26/2012   tubular adenoma, sm int hem, rpt 5 yrs Fuller Plan)   ELBOW SURGERY Right 1997   eye lids raised     NASAL SEPTUM SURGERY     POLYPECTOMY N/A 05/27/2013   Procedure: ENDOSCOPIC NASOPHARYNGEAL MASS;  Surgeon: [name], MD   ROTATOR CUFF REPAIR  09/2011   left, with subacromial decompression   TONSILLECTOMY AND ADENOIDECTOMY      Current Medications: Current Meds  Medication Sig   atorvastatin (LIPITOR) 80 MG  tablet Take 80 mg by mouth at bedtime.   baclofen (LIORESAL) 10 MG tablet Take 10 mg by mouth 3 (three) times daily.   cholecalciferol (VITAMIN D3) 25 MCG (1000 UNIT) tablet Take 1,000 Units by mouth daily.   clopidogrel (PLAVIX) 75 MG tablet Take 1 tablet (75 mg total) by mouth daily.   empagliflozin (JARDIANCE) 25 MG TABS tablet Take 25 mg by mouth daily.   fosinopril ([name]) 40 MG tablet Take 1 tablet (40 mg total) by mouth daily.   furosemide (LASIX) 20 MG tablet Take 20-40 mg by mouth See admin instructions. Take 20 mg  on Mon, Wed, Fri, and Sun. Take 40 mg on Tues, Thurs, and Sat   insulin aspart (NOVOLOG FLEXPEN) 100 UNIT/ML FlexPen Inject 6 Units into the skin 2 (two) times daily with a meal. Breakfast and dinner   insulin glargine (LANTUS) 100 UNIT/ML Solostar Pen Inject 8 Units into the skin at bedtime.   LACTOBACILLUS PO Take 1 capsule by mouth every Monday, Wednesday, and Friday.   loratadine (CLARITIN) 10 MG tablet Take 10 mg by mouth.   megestrol (MEGACE ES) 625 MG/5ML suspension Take 5 mLs (625 mg total) by mouth daily.   metoprolol tartrate (LOPRESSOR) 25 MG tablet Take 1 tablet (25 mg total) by mouth 2 (two) times daily.   Olopatadine HCl (PATADAY OP) Place 1 drop into both eyes daily as needed (allergies).   pantoprazole (PROTONIX) 20 MG tablet TAKE 1 TABLET BY MOUTH EVERY DAY   Potassium 99 MG TABS Take 1 tablet (99 mg total) by mouth daily as needed (when you take 2 lasix pills).   sertraline (ZOLOFT) 25 MG tablet Take 1 tablet (25 mg total) by mouth daily.   silver sulfADIAZINE (SILVADENE) 1 % cream Apply 1 application topically daily. To sore on buttocks daily     Allergies:   Niacin   Social History   Socioeconomic History   Marital status: Married    Spouse name: Not on file   Number of children: 2   Years of education: Bachelors   Highest education level: Not on file  Occupational History   Occupation: Music therapist: [employer]  Tobacco Use   Smoking status: Former   Smokeless tobacco: Never   Tobacco comments:    quit smoking at age 86  Vaping Use   Vaping Use: Never used  Substance and Sexual Activity   Alcohol use: No   Drug use: No   Sexual activity: Yes  Other Topics Concern   Not on file  Social History Narrative   Caffeine: 6-7 cups coffee   Lives with wife, no pets   2 grown children, 4 grandchildren   Occu: Artist   Edu: [employer]   Activity: not much   Diet: plenty of water, fruits and vegetables   Some care  through [place]      Endo: Dr. Buddy Duty   Social Determinants of Health   Financial Resource Strain: Low Risk    Difficulty of Paying Living Expenses: Not hard at all  Food Insecurity: No Food Insecurity   Worried About Charity fundraiser in the Last Year: Never true   [place] in the Last Year: Never true  Transportation Needs: No Transportation Needs   Lack of Transportation (Medical): No   Lack of Transportation (Non-Medical): No  Physical Activity: Sufficiently Active   Days of Exercise per Week: 5 days   Minutes of Exercise per Session: 30  min  Stress: No Stress Concern Present   Feeling of Stress : Not at all  Social Connections: Not on file     Family History:  The patient's  family history includes Alcohol abuse in his father; Cancer (age of onset: 31) in his sister; Heart attack in his father and son.   ROS:   Please see the history of present illness.    ROS All other systems reviewed and are negative.   PHYSICAL EXAM:   VS:  BP 128/76    Pulse 64    Ht 5\' 8"  (1.727 m)    Wt 141 lb 6.4 oz (64.1 kg)    SpO2 99%    BMI 21.50 kg/m   Physical Exam  GEN: Thin, in no acute distress  Neck: no JVD, carotid bruits, or masses Cardiac:RRR; 2/6 systolic murmur apex Respiratory:  clear to auscultation bilaterally, normal work of breathing GI: soft, nontender, nondistended, + BS Ext: bilateral edema R>L  hematoma on R lower leg followed by PCP. Good distal pulses bilaterally Neuro:  Alert and Oriented x 3 right hemiparesis Psych: euthymic mood, full affect  Wt Readings from Last 3 Encounters:  03/23/21 141 lb 6.4 oz (64.1 kg)  03/22/21 146 lb 4 oz (66.3 kg)  03/16/21 137 lb 8 oz (62.4 kg)      Studies/Labs Reviewed:   EKG:  EKG is not ordered today.  The ekg reviewed from ED stable.  Recent Labs: 03/16/2021: ALT 32; BUN 28; Creatinine 1.09; Potassium 3.5; Sodium 139 03/22/2021: Hemoglobin 12.2; Platelets 177.0   Lipid Panel    Component Value Date/Time   CHOL 153  04/08/2020 1059   CHOL 211 02/01/2011 0000   TRIG 179.0 (H) 04/08/2020 1059   TRIG 342 09/30/2013 0000   TRIG 342 09/30/2013 0000   HDL 35.80 (L) 04/08/2020 1059   CHOLHDL 4 04/08/2020 1059   VLDL 35.8 04/08/2020 1059   LDLCALC 82 04/08/2020 1059   LDLCALC 98 09/30/2013 0000   LDLCALC 98 09/30/2013 0000   LDLDIRECT 67.0 10/31/2017 1250    Additional studies/ records that were reviewed today include:   ECHO: 06/09/14: - Left ventricle: The cavity size was normal. Wall thickness was   normal. Systolic function was normal. The estimated ejection   fraction was in the range of 55% to 60%. Regional wall motion   abnormalities cannot be excluded. Doppler parameters are   consistent with abnormal left ventricular relaxation (grade 1   diastolic dysfunction).  Echo 04/07/15 [facility] Normal LVEF >55%, grade 1 DD, trivial MR, mildly dilated ascending aorta   Risk Assessment/Calculations:         ASSESSMENT:    1. Coronary artery disease involving native coronary artery of native heart without angina pectoris   2. History of CVA (cerebrovascular accident)   3. Hypertension associated with diabetes ([place])   4. Hyperlipidemia associated with type 2 diabetes mellitus ([place])   5. Diabetes mellitus with coincident hypertension ([place])   6. SOB (shortness of breath)      PLAN:  In order of problems listed above:  CAD S/P PCI/PTCA distal RCA 2004, no ischemia NST 2010, normal LVEF 2016 and 2017-no angina but ER visit for Dyspnea and orthostatic hypotension improved with IV fluids. To start immunotherapy next week. Very weak and oncology wanted him checked.  will repeat echo since he's gone through chemo taxol/carbo.  History of CVA 2016 with hemorrhagic transformation and right sided hemiparesis now on plavix for secondary prevention per Dr. Antionette Char movement  since his CVA  HTN well controlled   HLD-for labs next month with PCP  DM-per PCP  Esophageal CA finished  chemo-taxol/carbo and radiation, to start immunotherapy next week. Significant weight loss.   Leg edema chronic since stroke and diabetes. Takes lasxi 20 mg alt with 40 mg.-wound on right leg-hematoma being followed.  Shared Decision Making/Informed Consent        Medication Adjustments/Labs and Tests Ordered: Current medicines are reviewed at length with the patient today.  Concerns regarding medicines are outlined above.  Medication changes, Labs and Tests ordered today are listed in the Patient Instructions below. Patient Instructions  Medication Instructions:  Your physician recommends that you continue on your current medications as directed. Please refer to the Current Medication list given to you today.  *If you need a refill on your cardiac medications before your next appointment, please call your pharmacy*   Lab Work: None ordered   If you have labs (blood work) drawn today and your tests are completely normal, you will receive your results only by: [place] (if you have MyChart) OR A paper copy in the mail If you have any lab test that is abnormal or we need to change your treatment, we will call you to review the results.   Testing/Procedures: Your physician has requested that you have an echocardiogram. Echocardiography is a painless test that uses sound waves to create images of your heart. It provides your doctor with information about the size and shape of your heart and how well your hearts chambers and valves are working. This procedure takes approximately one hour. There are no restrictions for this procedure.    Follow-Up: At [facility], you and your health needs are our priority.  As part of our continuing mission to provide you with exceptional heart care, we have created designated Provider Care Teams.  These Care Teams include your primary Cardiologist (physician) and Advanced Practice Providers (APPs -  Physician Assistants and Nurse Practitioners)  who all work together to provide you with the care you need, when you need it.  We recommend signing up for the patient portal called "MyChart".  Sign up information is provided on this After Visit Summary.  MyChart is used to connect with patients for Virtual Visits (Telemedicine).  Patients are able to view lab/test results, encounter notes, upcoming appointments, etc.  Non-urgent messages can be sent to your provider as well.   To learn more about what you can do with MyChart, go to [email or web address].    Your next appointment:   6 month(s)  The format for your next appointment:   In Person  Provider:   Candee Furbish, MD     Other Instructions None     Signed, Ermalinda Barrios, PA-C  03/23/2021 10:08 AM    [facility] [address], [place], [place]  [address] Phone: [phone]; Fax: [phone]

## 2021-03-22 NOTE — Assessment & Plan Note (Signed)
Patient has hematoma to right lower lateral extremity.  Patient did fall at home and did not get evaluated.  Did discuss conservative treatment inclusive of being mindful of hematoma and then using ice to help with the reabsorption of hematoma.  Also drew lines around to gauge growth and improvement.  Did discuss signs and symptoms with patient and daughter as when to seek urgent or emergent health care.  We will continue to monitor.  Pending CBC in office

## 2021-03-22 NOTE — Assessment & Plan Note (Signed)
Patient dorsals fall striking right lower extremity.  Was not evaluated no other symptoms.  Continue to monitor follow-up as needed

## 2021-03-22 NOTE — Progress Notes (Signed)
Acute Office Visit  Subjective:    Patient ID: Jeffrey [name], male    DOB: Nov 08, 1940, 81 y.o.   MRN: [id number]  Chief Complaint  Patient presents with   Leg Problem    [place] on 03/17/21 but daughter checked and did not see any break in the skin of his legs. But then noticed yesterday 03/21/21, right lower leg redness, swelling, has a dark knot present. Patient is not able to say if this hurts or what he may have done due to no feeling on that side.    HPI Patient is in today for Leg problem  States that he fell on 03/17/2021 and daughter checked his skin and skin was ok Daughter states she came over and checked his legs yesterday and noticed a knot on right lower lateral leg. Patient has history of CVA and has diminished sensation. Has no complaints just incidental finding    Past Medical History:  Diagnosis Date   Anterior cerebral circulation hemorrhagic infarction [facility]) 11/24/2014   March, 2016, dominant left thalamic and left internal capsule ischemic infarct with resultant hemorrhagic transformation, secondary to small vessel disease. Resulted in right hemiparesis dysarthria and diplopia   //   readmission with aphasia July, 2016, this improved    Arthritis    CAD (coronary artery disease)    PCI distal RCA ...2004, residual 70% LAD   /   ...nuclear...03/2007...no ischemia.Marland KitchenMarland Kitchenpreserved LV /  nuclear...03/03/2009...inferior scar..no ischemia..EF 51%   Cyst of nasopharynx    per ENT Wilburn Cornelia   Dyslipidemia    takes Atorvastatin daily   GERD (gastroesophageal reflux disease)    takes Omeprazole daily   Gout    takes Allopurinol daily and Colchicine as needed   HCAP (healthcare-associated pneumonia)    Hemiparesis affecting right side as late effect of cerebrovascular accident ([place]) 09/05/2014   History of colon polyps    HTN (hypertension)    takes Cardura,Metoprolol,[name],and Amlodipine daily   Internal hemorrhoids    Myocardial infarction [facility]) 74yrs ago   Peripheral  edema    takes Lasix daily   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 [name])    Past Surgical History:  Procedure Laterality Date   BALLOON ANGIOPLASTY, ARTERY  1992, 2004   CAD, Dr. Ron Parker   CARDIAC CATHETERIZATION  2004   CATARACT EXTRACTION Bilateral 2010   COLONOSCOPY  04/03/2002   adenomatous polyp, int hemorrhoids   COLONOSCOPY  07/18/2007   normal (Dr. Fuller Plan)   COLONOSCOPY  09/26/2012   tubular adenoma, sm int hem, rpt 5 yrs Fuller Plan)   ELBOW SURGERY Right 1997   eye lids raised     NASAL SEPTUM SURGERY     POLYPECTOMY N/A 05/27/2013   Procedure: ENDOSCOPIC NASOPHARYNGEAL MASS;  Surgeon: [name], MD   ROTATOR CUFF REPAIR  09/2011   left, with subacromial decompression   TONSILLECTOMY AND ADENOIDECTOMY      Family History  Problem Relation Age of Onset   Alcohol abuse Father    Heart attack Father    Cancer Sister 94       breast cancer   Heart attack Son    Coronary artery disease Neg Hx    Stroke Neg Hx    Diabetes Neg Hx  Colon cancer Neg Hx     Social History   Socioeconomic History   Marital status: Married    Spouse name: Not on file   Number of children: 2   Years of education: Bachelors   Highest education level: Not on file  Occupational History   Occupation: Music therapist: [employer]  Tobacco Use   Smoking status: Former   Smokeless tobacco: Never   Tobacco comments:    quit smoking at age 34  Vaping Use   Vaping Use: Never used  Substance and Sexual Activity   Alcohol use: No   Drug use: No   Sexual activity: Yes  Other Topics Concern   Not on file  Social History Narrative   Caffeine: 6-7 cups coffee   Lives with wife, no pets   2 grown children, 4 grandchildren   Occu: Artist    Edu: [employer]   Activity: not much   Diet: plenty of water, fruits and vegetables   Some care through [place]      Endo: Dr. Buddy Duty   Social Determinants of Health   Financial Resource Strain: Low Risk    Difficulty of Paying Living Expenses: Not hard at all  Food Insecurity: No Food Insecurity   Worried About Charity fundraiser in the Last Year: Never true   [place] in the Last Year: Never true  Transportation Needs: No Transportation Needs   Lack of Transportation (Medical): No   Lack of Transportation (Non-Medical): No  Physical Activity: Sufficiently Active   Days of Exercise per Week: 5 days   Minutes of Exercise per Session: 30 min  Stress: No Stress Concern Present   Feeling of Stress : Not at all  Social Connections: Not on file  Intimate Partner Violence: Not At Risk   Fear of Current or Ex-Partner: No   Emotionally Abused: No   Physically Abused: No   Sexually Abused: No    Outpatient Medications Prior to Visit  Medication Sig Dispense Refill   atorvastatin (LIPITOR) 80 MG tablet Take 80 mg by mouth at bedtime.     baclofen (LIORESAL) 10 MG tablet Take 10 mg by mouth 3 (three) times daily.     cholecalciferol (VITAMIN D3) 25 MCG (1000 UNIT) tablet Take 1,000 Units by mouth daily.     clopidogrel (PLAVIX) 75 MG tablet Take 1 tablet (75 mg total) by mouth daily.     empagliflozin (JARDIANCE) 25 MG TABS tablet Take 25 mg by mouth daily.     fosinopril ([name]) 40 MG tablet Take 1 tablet (40 mg total) by mouth daily.     furosemide (LASIX) 20 MG tablet Take 20-40 mg by mouth See admin instructions. Take 20 mg on Mon, Wed, Fri, and Sun. Take 40 mg on Tues, Thurs, and Sat     insulin aspart (NOVOLOG FLEXPEN) 100 UNIT/ML FlexPen Inject 6 Units into the skin 2 (two) times daily with a meal. Breakfast and dinner     insulin glargine (LANTUS) 100 UNIT/ML Solostar Pen Inject 8 Units into the skin at bedtime.     LACTOBACILLUS PO Take 1 capsule by mouth every  Monday, Wednesday, and Friday.     loratadine (CLARITIN) 10 MG tablet Take 10 mg by mouth.     megestrol (MEGACE ES) 625 MG/5ML suspension Take 5 mLs (625 mg total) by mouth daily. 150 mL 2   metoprolol tartrate (LOPRESSOR) 25 MG tablet Take 1 tablet (25 mg  total) by mouth 2 (two) times daily.     Olopatadine HCl (PATADAY OP) Place 1 drop into both eyes daily as needed (allergies).     pantoprazole (PROTONIX) 20 MG tablet TAKE 1 TABLET BY MOUTH EVERY DAY 30 tablet 3   Potassium 99 MG TABS Take 1 tablet (99 mg total) by mouth daily as needed (when you take 2 lasix pills). (Patient taking differently: Take 1 tablet by mouth 3 (three) times a week. Tues, Thurs, and Sat)     sertraline (ZOLOFT) 25 MG tablet Take 1 tablet (25 mg total) by mouth daily.     silver sulfADIAZINE (SILVADENE) 1 % cream Apply 1 application topically daily. To sore on buttocks daily 50 g 0   No facility-administered medications prior to visit.    Allergies  Allergen Reactions   Niacin Other (See Comments)    flushed skin/rash     Review of Systems  Constitutional:  Negative for chills and fever.  Respiratory:  Negative for cough and shortness of breath.   Cardiovascular:  Negative for chest pain.  Skin:  Positive for color change and rash.  Neurological:  Positive for numbness (decreased feeling on the right side post CVA).      Objective:    Physical Exam Vitals and nursing note reviewed.  Constitutional:      Appearance: Normal appearance.     Comments: In a wheel chair   Cardiovascular:     Rate and Rhythm: Normal rate and regular rhythm.     Pulses:          Dorsalis pedis pulses are 2+ on the right side.     Heart sounds: Normal heart sounds.  Pulmonary:     Effort: Pulmonary effort is normal.     Breath sounds: Normal breath sounds.  Skin:    General: Skin is warm and dry.     Findings: Bruising present.     Comments: Center mass was 4cm x 3cm  Total was 8.5 CM wide.  No redness,  tenderness, or new erythema. Clinic picture to show both legs for color comparison Ecchymosis present   Neurological:     Mental Status: He is alert. Mental status is at baseline.       BP (!) 104/52    Pulse 80    Temp 98.1 F (36.7 C)    Resp 12    Ht 5\' 8"  (1.727 m)    Wt 146 lb 4 oz (66.3 kg)    SpO2 95%    BMI 22.24 kg/m  Wt Readings from Last 3 Encounters:  03/22/21 146 lb 4 oz (66.3 kg)  03/16/21 137 lb 8 oz (62.4 kg)  03/04/21 137 lb (62.1 kg)    Health Maintenance Due  Topic Date Due   OPHTHALMOLOGY EXAM  11/30/2019    There are no preventive care reminders to display for this patient.   Lab Results  Component Value Date   TSH 1.00 10/03/2019   Lab Results  Component Value Date   WBC 5.4 03/16/2021   HGB 12.4 (L) 03/16/2021   HCT 34.7 (L) 03/16/2021   MCV 85.9 03/16/2021   PLT 214 03/16/2021   Lab Results  Component Value Date   NA 139 03/16/2021   K 3.5 03/16/2021   CO2 24 03/16/2021   GLUCOSE 168 (H) 03/16/2021   BUN 28 (H) 03/16/2021   CREATININE 1.09 03/16/2021   BILITOT 0.6 03/16/2021   ALKPHOS 78 03/16/2021   AST 21 03/16/2021  ALT 32 03/16/2021   PROT 5.8 (L) 03/16/2021   ALBUMIN 3.2 (L) 03/16/2021   CALCIUM 9.0 03/16/2021   ANIONGAP 7 03/16/2021   GFR 73.29 11/30/2020   Lab Results  Component Value Date   CHOL 153 04/08/2020   Lab Results  Component Value Date   HDL 35.80 (L) 04/08/2020   Lab Results  Component Value Date   LDLCALC 82 04/08/2020   Lab Results  Component Value Date   TRIG 179.0 (H) 04/08/2020   Lab Results  Component Value Date   CHOLHDL 4 04/08/2020   Lab Results  Component Value Date   HGBA1C 7.4 (H) 10/14/2020       Assessment & Plan:   Problem List Items Addressed This Visit       Other   Fall - Primary    Patient dorsals fall striking right lower extremity.  Was not evaluated no other symptoms.  Continue to monitor follow-up as needed      Hematoma    Patient has hematoma to right lower  lateral extremity.  Patient did fall at home and did not get evaluated.  Did discuss conservative treatment inclusive of being mindful of hematoma and then using ice to help with the reabsorption of hematoma.  Also drew lines around to gauge growth and improvement.  Did discuss signs and symptoms with patient and daughter as when to seek urgent or emergent health care.  We will continue to monitor.  Pending CBC in office      Relevant Orders   CBC with Differential/Platelet (Completed)     No orders of the defined types were placed in this encounter.  This visit occurred during the SARS-CoV-2 public health emergency.  Safety protocols were in place, including screening questions prior to the visit, additional usage of staff PPE, and extensive cleaning of exam room while observing appropriate contact time as indicated for disinfecting solutions.    Romilda Garret, NP

## 2021-03-23 ENCOUNTER — Ambulatory Visit: Payer: PPO | Admitting: Physician Assistant

## 2021-03-23 ENCOUNTER — Encounter: Payer: Self-pay | Admitting: Physician Assistant

## 2021-03-23 VITALS — BP 128/76 | HR 64 | Ht 68.0 in | Wt 141.4 lb

## 2021-03-23 DIAGNOSIS — Z8673 Personal history of transient ischemic attack (TIA), and cerebral infarction without residual deficits: Secondary | ICD-10-CM | POA: Diagnosis not present

## 2021-03-23 DIAGNOSIS — I10 Essential (primary) hypertension: Secondary | ICD-10-CM

## 2021-03-23 DIAGNOSIS — E1159 Type 2 diabetes mellitus with other circulatory complications: Secondary | ICD-10-CM

## 2021-03-23 DIAGNOSIS — I2510 Atherosclerotic heart disease of native coronary artery without angina pectoris: Secondary | ICD-10-CM | POA: Diagnosis not present

## 2021-03-23 DIAGNOSIS — R0602 Shortness of breath: Secondary | ICD-10-CM

## 2021-03-23 DIAGNOSIS — E785 Hyperlipidemia, unspecified: Secondary | ICD-10-CM | POA: Diagnosis not present

## 2021-03-23 DIAGNOSIS — E1169 Type 2 diabetes mellitus with other specified complication: Secondary | ICD-10-CM

## 2021-03-23 DIAGNOSIS — I152 Hypertension secondary to endocrine disorders: Secondary | ICD-10-CM | POA: Diagnosis not present

## 2021-03-23 DIAGNOSIS — E119 Type 2 diabetes mellitus without complications: Secondary | ICD-10-CM | POA: Diagnosis not present

## 2021-03-23 NOTE — Patient Instructions (Signed)
Medication Instructions:  Your physician recommends that you continue on your current medications as directed. Please refer to the Current Medication list given to you today.  *If you need a refill on your cardiac medications before your next appointment, please call your pharmacy*   Lab Work: None ordered   If you have labs (blood work) drawn today and your tests are completely normal, you will receive your results only by: [place] (if you have MyChart) OR A paper copy in the mail If you have any lab test that is abnormal or we need to change your treatment, we will call you to review the results.   Testing/Procedures: Your physician has requested that you have an echocardiogram. Echocardiography is a painless test that uses sound waves to create images of your heart. It provides your doctor with information about the size and shape of your heart and how well your hearts chambers and valves are working. This procedure takes approximately one hour. There are no restrictions for this procedure.    Follow-Up: At [facility], you and your health needs are our priority.  As part of our continuing mission to provide you with exceptional heart care, we have created designated Provider Care Teams.  These Care Teams include your primary Cardiologist (physician) and Advanced Practice Providers (APPs -  Physician Assistants and Nurse Practitioners) who all work together to provide you with the care you need, when you need it.  We recommend signing up for the patient portal called "MyChart".  Sign up information is provided on this After Visit Summary.  MyChart is used to connect with patients for Virtual Visits (Telemedicine).  Patients are able to view lab/test results, encounter notes, upcoming appointments, etc.  Non-urgent messages can be sent to your provider as well.   To learn more about what you can do with MyChart, go to [email or web address].    Your next appointment:   6  month(s)  The format for your next appointment:   In Person  Provider:   Candee Furbish, MD     Other Instructions None

## 2021-03-24 DIAGNOSIS — T148XXA Other injury of unspecified body region, initial encounter: Secondary | ICD-10-CM | POA: Diagnosis not present

## 2021-03-24 NOTE — Progress Notes (Signed)
Pharmacist Chemotherapy Monitoring - Initial Assessment    Anticipated start date: 03/31/21   The following has been reviewed per standard work regarding the patient's treatment regimen: The patient's diagnosis, treatment plan and drug doses, and organ/hematologic function Lab orders and baseline tests specific to treatment regimen  The treatment plan start date, drug sequencing, and pre-medications Prior authorization status  Patient's documented medication list, including drug-drug interaction screen and prescriptions for anti-emetics and supportive care specific to the treatment regimen The drug concentrations, fluid compatibility, administration routes, and timing of the medications to be used The patient's access for treatment and lifetime cumulative dose history, if applicable  The patient's medication allergies and previous infusion related reactions, if applicable   Changes made to treatment plan:  N/A  Follow up needed:  N/A   Larene Beach, RPH, 03/24/2021  3:09 PM

## 2021-03-24 NOTE — Progress Notes (Signed)
Pharmacist Chemotherapy Monitoring - Initial Assessment    Anticipated start date: 03/31/21   The following has been reviewed per standard work regarding the patient's treatment regimen: The patient's diagnosis, treatment plan and drug doses, and organ/hematologic function Lab orders and baseline tests specific to treatment regimen  The treatment plan start date, drug sequencing, and pre-medications Prior authorization status  Patient's documented medication list, including drug-drug interaction screen and prescriptions for anti-emetics and supportive care specific to the treatment regimen The drug concentrations, fluid compatibility, administration routes, and timing of the medications to be used The patient's access for treatment and lifetime cumulative dose history, if applicable  The patient's medication allergies and previous infusion related reactions, if applicable   Changes made to treatment plan:  N/A  Follow up needed:  N/A   Larene Beach, RPH, 03/24/2021  3:20 PM

## 2021-03-26 ENCOUNTER — Other Ambulatory Visit: Payer: Self-pay

## 2021-03-26 NOTE — Progress Notes (Signed)
Followed up with [facility] [phone] regarding referral for Home Health faxed over to them on 03/17/2021.  [facility] stated they did accept pt do to lack of staff to cover pt's needs and area.  Contacted Mr. Lagrow' daughter to give her this information.  [facility] and [facility] does not accept pt's insurance.  Pt's daughter is going to contact to pt's insurance company to see who is an authorized provider for [facility].  Pt's daughter will call Dr. Ernestina Penna office with this information so pt's referral can be faxed to that facility.

## 2021-03-29 ENCOUNTER — Ambulatory Visit
Admission: RE | Admit: 2021-03-29 | Discharge: 2021-03-29 | Disposition: A | Discharge status: Home / Self Care | Payer: PPO | Source: Ambulatory Visit | Attending: Radiation Oncology | Admitting: Radiation Oncology

## 2021-03-29 DIAGNOSIS — C158 Malignant neoplasm of overlapping sites of esophagus: Secondary | ICD-10-CM

## 2021-03-29 NOTE — Progress Notes (Signed)
°  Radiation Oncology         [phone] ________________________________  Name: Jeffrey [name] MRN: [id number]  Date of Service: 03/29/2021  DOB: 1940-07-01  Post Treatment Telephone Note  Diagnosis:   At least Stage IIA, cT1xN1Mx Adenocarcinoma of the GE junction  Intent: Curative  Radiation Treatment Dates: 01/18/2021 through 02/26/2021 Site Technique Total Dose (Gy) Dose per Fx (Gy) Completed Fx Beam Energies  Esophagus: Esoph IMRT 45/45 1.8 25/25 6X  Esophagus: Esoph_Bst IMRT 5.4/5.4 1.8 3/3 6X   Narrative: The patient tolerated radiation therapy relatively well. He developed fatigue and dysphagia during therapy.   Impression/Plan: 1. At least Stage IIA, cT1xN1Mx Adenocarcinoma of the GE junction. I was unable to reach the patient but left a voicemail and on the message, I discussed that we would be happy to continue to follow him as needed, but he will also continue to follow up with Dr. Burr Medico in medical oncology.      Carola Rhine, PAC

## 2021-03-30 ENCOUNTER — Encounter: Payer: PPO | Admitting: Dietician

## 2021-03-31 ENCOUNTER — Inpatient Hospital Stay (HOSPITAL_BASED_OUTPATIENT_CLINIC_OR_DEPARTMENT_OTHER): Payer: PPO | Admitting: Hematology

## 2021-03-31 ENCOUNTER — Inpatient Hospital Stay: Payer: PPO

## 2021-03-31 ENCOUNTER — Other Ambulatory Visit: Payer: Self-pay

## 2021-03-31 VITALS — BP 122/68 | HR 66 | Temp 98.7°F | Resp 18 | Ht 68.0 in | Wt 140.9 lb

## 2021-03-31 DIAGNOSIS — C155 Malignant neoplasm of lower third of esophagus: Secondary | ICD-10-CM

## 2021-03-31 DIAGNOSIS — C158 Malignant neoplasm of overlapping sites of esophagus: Secondary | ICD-10-CM

## 2021-03-31 DIAGNOSIS — C160 Malignant neoplasm of cardia: Secondary | ICD-10-CM | POA: Diagnosis not present

## 2021-03-31 LAB — CMP (CANCER CENTER ONLY)
ALT: 31 U/L (ref 0–44)
AST: 21 U/L (ref 15–41)
Albumin: 2.8 g/dL — ABNORMAL LOW (ref 3.5–5.0)
Alkaline Phosphatase: 75 U/L (ref 38–126)
Anion gap: 9 (ref 5–15)
BUN: 18 mg/dL (ref 8–23)
CO2: 19 mmol/L — ABNORMAL LOW (ref 22–32)
Calcium: 8.4 mg/dL — ABNORMAL LOW (ref 8.9–10.3)
Chloride: 108 mmol/L (ref 98–111)
Creatinine: 0.73 mg/dL (ref 0.61–1.24)
GFR, Estimated: >60 mL/min (ref >60)
Glucose, Bld: 228 mg/dL — ABNORMAL HIGH (ref 70–99)
Potassium: 3.7 mmol/L (ref 3.5–5.1)
Sodium: 136 mmol/L (ref 135–145)
Total Bilirubin: 0.6 mg/dL (ref 0.3–1.2)
Total Protein: 5.4 g/dL — ABNORMAL LOW (ref 6.5–8.1)

## 2021-03-31 LAB — TSH: TSH: 0.411 u[IU]/mL (ref 0.320–4.118)

## 2021-03-31 MED ORDER — SODIUM CHLORIDE 0.9 % IV SOLN
240.0000 mg | Freq: Once | INTRAVENOUS | Status: AC
  Administered 2021-03-31: 16:00:00 240 mg via INTRAVENOUS
  Filled 2021-03-31: qty 24

## 2021-03-31 MED ORDER — SODIUM CHLORIDE 0.9 % IV SOLN: Freq: Once | INTRAVENOUS | Status: AC

## 2021-03-31 NOTE — Patient Instructions (Signed)
[facility]  Discharge Instructions: Thank you for choosing [facility] to provide your oncology and hematology care.   If you have a lab appointment with the [facility], please go directly to the [facility] and check in at the registration area.   Wear comfortable clothing and clothing appropriate for easy access to any Portacath or PICC line.   We strive to give you quality time with your provider. You may need to reschedule your appointment if you arrive late (15 or more minutes).  Arriving late affects you and other patients whose appointments are after yours.  Also, if you miss three or more appointments without notifying the office, you may be dismissed from the clinic at the providers discretion.      For prescription refill requests, have your pharmacy contact our office and allow 72 hours for refills to be completed.    Today you received the following chemotherapy and/or immunotherapy agents: Nivolumab      To help prevent nausea and vomiting after your treatment, we encourage you to take your nausea medication as directed.  BELOW ARE SYMPTOMS THAT SHOULD BE REPORTED IMMEDIATELY: *FEVER GREATER THAN 100.4 F (38 C) OR HIGHER *CHILLS OR SWEATING *NAUSEA AND VOMITING THAT IS NOT CONTROLLED WITH YOUR NAUSEA MEDICATION *UNUSUAL SHORTNESS OF BREATH *UNUSUAL BRUISING OR BLEEDING *URINARY PROBLEMS (pain or burning when urinating, or frequent urination) *BOWEL PROBLEMS (unusual diarrhea, constipation, pain near the anus) TENDERNESS IN MOUTH AND THROAT WITH OR WITHOUT PRESENCE OF ULCERS (sore throat, sores in mouth, or a toothache) UNUSUAL RASH, SWELLING OR PAIN  UNUSUAL VAGINAL DISCHARGE OR ITCHING   Items with * indicate a potential emergency and should be followed up as soon as possible or go to the Emergency Department if any problems should occur.  Please show the CHEMOTHERAPY ALERT CARD or IMMUNOTHERAPY ALERT CARD at check-in to  the Emergency Department and triage nurse.  Should you have questions after your visit or need to cancel or reschedule your appointment, please contact [facility]  Dept: [phone]  and follow the prompts.  Office hours are 8:00 a.m. to 4:30 p.m. Monday - Friday. Please note that voicemails left after 4:00 p.m. may not be returned until the following business day.  We are closed weekends and major holidays. You have access to a nurse at all times for urgent questions. Please call the main number to the clinic Dept: [phone] and follow the prompts.   For any non-urgent questions, you may also contact your provider using MyChart. We now offer e-Visits for anyone 25 and older to request care online for non-urgent symptoms. For details visit [email or web address].   Also download the MyChart app! Go to the app store, search "MyChart", open the app, select [facility], and log in with your MyChart username and password.  Due to Covid, a mask is required upon entering the hospital/clinic. If you do not have a mask, one will be given to you upon arrival. For doctor visits, patients may have 1 support person aged 62 or older with them. For treatment visits, patients cannot have anyone with them due to current Covid guidelines and our immunocompromised population.   Nivolumab injection What is this medication? NIVOLUMAB (nye VOL ue mab) is a monoclonal antibody. It treats certain types of cancer. Some of the cancers treated are colon cancer, head and neck cancer, Hodgkin lymphoma, lung cancer, and melanoma. This medicine may be used for other purposes; ask your  health care provider or pharmacist if you have questions. COMMON BRAND NAME(S): Opdivo What should I tell my care team before I take this medication? They need to know if you have any of these conditions: Autoimmune diseases such as Crohn's disease, ulcerative colitis, or lupus Have had or planning to have an  allogeneic stem cell transplant (uses someone else's stem cells) History of chest radiation Organ transplant Nervous system problems such as myasthenia gravis or Guillain-Barre syndrome An unusual or allergic reaction to nivolumab, other medicines, foods, dyes, or preservatives Pregnant or trying to get pregnant Breast-feeding How should I use this medication? This medication is injected into a vein. It is given in a hospital or clinic setting. A special MedGuide will be given to you before each treatment. Be sure to read this information carefully each time. Talk to your care team regarding the use of this medication in children. While it may be prescribed for children as young as 12 years for selected conditions, precautions do apply. Overdosage: If you think you have taken too much of this medicine contact a poison control center or emergency room at once. NOTE: This medicine is only for you. Do not share this medicine with others. What if I miss a dose? Keep appointments for follow-up doses. It is important not to miss your dose. Call your care team if you are unable to keep an appointment. What may interact with this medication? Interactions have not been studied. This list may not describe all possible interactions. Give your health care provider a list of all the medicines, herbs, non-prescription drugs, or dietary supplements you use. Also tell them if you smoke, drink alcohol, or use illegal drugs. Some items may interact with your medicine. What should I watch for while using this medication? Your condition will be monitored carefully while you are receiving this medication. You may need blood work done while you are taking this medication. Do not become pregnant while taking this medication or for 5 months after stopping it. Women should inform their care team if they wish to become pregnant or think they might be pregnant. There is a potential for serious harm to an unborn child.  Talk to your care team for more information. Do not breast-feed an infant while taking this medication or for 5 months after stopping it. What side effects may I notice from receiving this medication? Side effects that you should report to your care team as soon as possible: Allergic reactions--skin rash, itching, hives, swelling of the face, lips, tongue, or throat Bloody or black, tar-like stools Change in vision Chest pain Diarrhea Dry cough, shortness of breath or trouble breathing Eye pain Fast or irregular heartbeat Fever, chills High blood sugar (hyperglycemia)--increased thirst or amount of urine, unusual weakness or fatigue, blurry vision High thyroid levels (hyperthyroidism)--fast or irregular heartbeat, weight loss, excessive sweating or sensitivity to heat, tremors or shaking, anxiety, nervousness, irregular menstrual cycle or spotting Kidney injury--decrease in the amount of urine, swelling of the ankles, hands, or feet Liver injury--right upper belly pain, loss of appetite, nausea, light-colored stool, dark yellow or brown urine, yellowing skin or eyes, unusual weakness or fatigue Low red blood cell count--unusual weakness or fatigue, dizziness, headache, trouble breathing Low thyroid levels (hypothyroidism)--unusual weakness or fatigue, increased sensitivity to cold, constipation, hair loss, dry skin, weight gain, feelings of depression Mood and behavior changes-confusion, change in sex drive or performance, irritability Muscle pain or cramps Pain, tingling, or numbness in the hands or feet, muscle weakness, trouble  walking, loss of balance or coordination Red or dark brown urine Redness, blistering, peeling, or loosening of the skin, including inside the mouth Stomach pain Unusual bruising or bleeding Side effects that usually do not require medical attention (report to your care team if they continue or are bothersome): Bone pain Constipation Loss of  appetite Nausea Tiredness Vomiting This list may not describe all possible side effects. Call your doctor for medical advice about side effects. You may report side effects to FDA at 1-800-FDA-1088. Where should I keep my medication? This medication is given in a hospital or clinic and will not be stored at home. NOTE: This sheet is a summary. It may not cover all possible information. If you have questions about this medicine, talk to your doctor, pharmacist, or health care provider.  2022 Elsevier/Gold Standard (2020-11-10 00:00:00)

## 2021-03-31 NOTE — Progress Notes (Signed)
Spoke w/ MD Burr Medico and she is ok with using 1/16's labs for treatment today.  Larene Beach, PharmD

## 2021-03-31 NOTE — Progress Notes (Signed)
[place]   [phone] [phone]   Clinic Follow up Note   Patient Care Team: [name], MD as PCP - General (Family Medicine) Jerline Pain, MD as PCP - Cardiology (Cardiology) Delia Chimes, NP (Inactive) as Nurse Practitioner (Nurse Practitioner) Ladene Artist, MD as Consulting Physician (Gastroenterology) Alla Feeling, NP as Nurse Practitioner (Nurse Practitioner) Truitt Merle, MD as Consulting Physician (Hematology) Truitt Merle, MD as Consulting Physician (Oncology)  Date of Service:  03/31/2021  CHIEF COMPLAINT: f/u of esophageal cancer  CURRENT THERAPY:  Nivolumab, to start 03/31/21  ASSESSMENT & PLAN:  Jeffrey [name] is a 81 y.o. male with   1. Adenocarcinoma of the GE junction, cTX N1 M0, HER2 negative, MMR normal, PD-L1 CPS 3% -He presented with 6-8 months of dysphagia and 10 pounds weight loss and a palpable right cervical lymph node. Initial CT neck 10/22/20 showed 2.8 cm LN, concerning for metastatic disease.  -CT 12/08/20 showed suspicious mass at GE junction and prominent gastrohepatic LNs. EGD 12/17/20 by Dr. Fuller Plan showed 5 cm distal esophageal mass, biopsy confirmed adenocarcinoma with signet ring features. HER 2 negative -PET scan 12/30/20 showed FDG-avid mass at the GE junction and mild uptake with borderline gastrohepatic ligament lymph nodes, no distant metastasis. -He began concurrent chemo RT with weekly carbo/Taxol 01/18/21, C1 carbo was dose reduced to AUC 1.5 for age and co-morbidities. He tolerated moderately well with fatigue, decreased appetite/taste change, nausea, and constipation. -cervical lymph node biopsy on 01/29/21 showed atypical cells.  -he completed chemo on 12/19 and radiation on 02/26/21. He had slow recovery -he is scheduled to begin nivolumab today, which he will take for one year. We discussed his recent falls and weight changes. I offered to delay treatment to give him additional time to heal,  but he feels ready to proceed. CBC from 03/22/21 reviewed, improving and adequate for treatment today.   2. Symptom Management: Weight loss, shortness of breath -Continue follow-up with dietitian -started on megace 02/01/21, which has helped. He feels he no longer needs this, as he is hungry all the time. They know they can reach out to our dietician any time. -he reports worsening SOB since completing radiation. CT angio chest in the ED on 03/04/21 was negative.   3. Goals of care -He understands if work-up confirms metastatic disease, it will not be curable but still treatable, treatment will be palliative   4. Comorbidities: CVA with residual right-sided weakness, history of MI, CAD, HTN, DM, HL -Patient lives alone, his daughter checks on him frequently to help with housework and food prep -He is limited in his ADLs due to residual right-sided weakness from CVA -He ambulates with support, can bathe and dress with difficulty     PLAN: -proceed with first Nivo today -lab, f/u, and nivo every 2 weeks x3   No problem-specific Assessment & Plan notes found for this encounter.   SUMMARY OF ONCOLOGIC HISTORY: Oncology History Overview Note  Cancer Staging Esophageal cancer [facility]) Staging form: Esophagus - Adenocarcinoma, AJCC 8th Edition - Clinical stage from 12/31/2020: Stage IIA (cT1, cN1, cM0, GX) - Unsigned    Esophageal cancer ([place])  10/22/2020 Imaging   CT neck IMPRESSION: 2.8 cm right level 2A lymph node, which appears to be centrally necrotic, concerning for metastatic disease. No additional lymphadenopathy or other acute process in the neck.     11/30/2020 Imaging   Barium swallow FINDINGS: Limitations on positioning due to sequelae of stroke. The patient swallowed barium  without difficulty. No aspiration. There is no mass or stricture. Normal motility. There is no hiatal hernia. No spontaneous gastroesophageal reflux. Barium pill was swallowed and passed without  difficulty.   IMPRESSION: No significant abnormality.   12/08/2020 Imaging   CT chest abdomen with contrast IMPRESSION: 1. Mass is suspected just at the level of the GE junction. Suspicious for primary esophageal neoplasm. Further evaluation with endoscopy is advised. 2. Prominent perigastric lymph nodes are identified, suspicious for metastatic adenopathy. 3. Gallstone. 4. Coronary artery calcifications. 5. Aortic Atherosclerosis (ICD10-I70.0) and Emphysema (ICD10-J43.9). 6. These results will be called to the ordering clinician or representative by the Radiologist Assistant, and communication documented in the PACS or [employer].     12/17/2020 Procedure   EGD by Dr. Fuller Plan - A medium-sized, fungating mass with contact bleeding was found in the distal esophagus, 36 cm from the incisors extending to 41 cm in the cardia. The mass was partially obstructing and circumferential.   12/17/2020 Initial Biopsy   Diagnosis Esophagus, biopsy, distal and gastric cardia mass - ADENOCARCINOMA WITH SIGNET RING FEATURES.   12/19/2020 Initial Diagnosis   Esophageal cancer ([place])   12/30/2020 PET scan   IMPRESSION: 1. Mass at the level of the GE junction and proximal stomach is FDG avid within SUV max of 7.62. Findings compatible with primary gastric neoplasm. 2. There is mild FDG uptake associated with borderline gastrohepatic ligament lymph nodes which have an SUV max of 2.97. Equivocal for nodal metastasis. 3. No signs of distant metastatic disease. 4. Aortic Atherosclerosis (ICD10-I70.0) and Emphysema (ICD10-J43.9). Coronary artery calcifications. 5. Gallstones. 6. Prostate gland enlargement.   01/08/2021 Imaging   EXAM: ULTRASOUND OF HEAD/NECK SOFT TISSUES  IMPRESSION: Sonographic evaluation of the right-side of the neck confirms a minimally complex cystic lesion with broad differential considerations including cystic/necrotic cervical lymphadenopathy (favored given presence of  an additional smaller apparently partially cystic right cervical lymph node), compatible with the findings on preceding PET-CT.   This minimally complex cystic structure/mass could undergo ultrasound-guided biopsy/aspiration as indicated.   01/18/2021 - 02/22/2021 Chemotherapy   Patient is on Treatment Plan : ESOPHAGUS Carboplatin/PACLitaxel weekly x 6 weeks with XRT       01/29/2021 Pathology Results   A. RIGHT, SUBMANDIBULAR CYST, FINE NEEDLE  ASPIRATION:   FINAL MICROSCOPIC DIAGNOSIS:  - Atypical cells present DIAGNOSTIC COMMENTS:  There are clusters of epithelioid cells with nuclear grooves and  pseudoinclusions admixed with pigmented histiocytes.  There is  insufficient material for ancillary studies.  Excisional biopsy may be  of interest.   03/31/2021 -  Chemotherapy   Patient is on Treatment Plan : HEAD/NECK Nivolumab q14d        INTERVAL HISTORY:  Jeffrey [name] is here for a follow up of esophageal cancer. He was last seen by me on 03/16/20. He presents to the clinic accompanied by his daughter. He reports he is doing "pretty good." He has an open blister on his right leg; he is unsure how he got it, but he does not have feeling on that side since prior CVA. He reports his swallowing ability varies.    All other systems were reviewed with the patient and are negative.  MEDICAL HISTORY:  Past Medical History:  Diagnosis Date   Anterior cerebral circulation hemorrhagic infarction [facility]) 11/24/2014   March, 2016, dominant left thalamic and left internal capsule ischemic infarct with resultant hemorrhagic transformation, secondary to small vessel disease. Resulted in right hemiparesis dysarthria and diplopia   //   readmission  with aphasia July, 2016, this improved    Arthritis    CAD (coronary artery disease)    PCI distal RCA ...2004, residual 70% LAD   /   ...nuclear...03/2007...no ischemia.Marland KitchenMarland Kitchenpreserved LV /  nuclear...03/03/2009...inferior scar..no ischemia..EF 51%    Cyst of nasopharynx    per ENT Wilburn Cornelia   Dyslipidemia    takes Atorvastatin daily   GERD (gastroesophageal reflux disease)    takes Omeprazole daily   Gout    takes Allopurinol daily and Colchicine as needed   HCAP (healthcare-associated pneumonia)    Hemiparesis affecting right side as late effect of cerebrovascular accident ([place]) 09/05/2014   History of colon polyps    HTN (hypertension)    takes Cardura,Metoprolol,[name],and Amlodipine daily   Internal hemorrhoids    Myocardial infarction [facility]) 82yr ago   Peripheral edema    takes Lasix daily   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    SURGICAL HISTORY: Past Surgical History:  Procedure Laterality Date   BALLOON ANGIOPLASTY, ARTERY  1992, 2004   CAD, Dr. KRon Parker  CARDIAC CATHETERIZATION  2004   CATARACT EXTRACTION Bilateral 2010   COLONOSCOPY  04/03/2002   adenomatous polyp, int hemorrhoids   COLONOSCOPY  07/18/2007   normal (Dr. SFuller Plan   COLONOSCOPY  09/26/2012   tubular adenoma, sm int hem, rpt 5 yrs (Fuller Plan   ELBOW SURGERY Right 1997   eye lids raised     NASAL SEPTUM SURGERY     POLYPECTOMY N/A 05/27/2013   Procedure: ENDOSCOPIC NASOPHARYNGEAL MASS;  Surgeon: DJerrell Belfast MD   ROTATOR CUFF REPAIR  09/2011   left, with subacromial decompression   TONSILLECTOMY AND ADENOIDECTOMY      I have reviewed the social history and family history with the patient and they are unchanged from previous note.  ALLERGIES:  is allergic to niacin.  MEDICATIONS:  Current Outpatient Medications  Medication Sig Dispense Refill   amoxicillin-clavulanate (AUGMENTIN) 500-125 MG tablet 1 tablet     atorvastatin (LIPITOR) 80 MG tablet Take 80 mg by mouth at bedtime.     baclofen (LIORESAL) 10 MG tablet  Take 10 mg by mouth 3 (three) times daily.     cholecalciferol (VITAMIN D3) 25 MCG (1000 UNIT) tablet Take 1,000 Units by mouth daily.     clopidogrel (PLAVIX) 75 MG tablet Take 1 tablet (75 mg total) by mouth daily.     empagliflozin (JARDIANCE) 25 MG TABS tablet Take 25 mg by mouth daily.     fosinopril ([name]) 40 MG tablet Take 1 tablet (40 mg total) by mouth daily.     furosemide (LASIX) 20 MG tablet Take 20-40 mg by mouth See admin instructions. Take 20 mg on Mon, Wed, Fri, and Sun. Take 40 mg on Tues, Thurs, and Sat     insulin aspart (NOVOLOG FLEXPEN) 100 UNIT/ML FlexPen Inject 6 Units into the skin 2 (two) times daily with a meal. Breakfast and dinner     insulin glargine (LANTUS) 100 UNIT/ML Solostar Pen Inject 8 Units into the skin at bedtime.     LACTOBACILLUS PO Take 1 capsule by mouth every Monday, Wednesday, and Friday.     loratadine (CLARITIN) 10 MG tablet Take 10 mg by mouth.     megestrol (MEGACE  ES) 625 MG/5ML suspension Take 5 mLs (625 mg total) by mouth daily. 150 mL 2   metoprolol tartrate (LOPRESSOR) 25 MG tablet Take 1 tablet (25 mg total) by mouth 2 (two) times daily.     Olopatadine HCl (PATADAY OP) Place 1 drop into both eyes daily as needed (allergies).     pantoprazole (PROTONIX) 20 MG tablet TAKE 1 TABLET BY MOUTH EVERY DAY 30 tablet 3   Potassium 99 MG TABS Take 1 tablet (99 mg total) by mouth daily as needed (when you take 2 lasix pills).     sertraline (ZOLOFT) 25 MG tablet Take 1 tablet (25 mg total) by mouth daily.     silver sulfADIAZINE (SILVADENE) 1 % cream Apply 1 application topically daily. To sore on buttocks daily 50 g 0   No current facility-administered medications for this visit.    PHYSICAL EXAMINATION: ECOG PERFORMANCE STATUS: 3 - Symptomatic, >50% confined to bed  Vitals:   03/31/21 1415  BP: 122/68  Pulse: 66  Resp: 18  Temp: 98.7 F (37.1 C)  SpO2: 97%   Wt Readings from Last 3 Encounters:  03/31/21 140 lb 14.4 oz (63.9 kg)   03/23/21 141 lb 6.4 oz (64.1 kg)  03/22/21 146 lb 4 oz (66.3 kg)     GENERAL:alert, no distress and comfortable SKIN: skin color normal, no rashes or significant lesions EYES: normal, Conjunctiva are pink and non-injected, sclera clear  NEURO: alert & oriented x 3 with fluent speech  LABORATORY DATA:  I have reviewed the data as listed CBC Latest Ref Rng & Units 03/22/2021 03/16/2021 03/04/2021  WBC 4.0 - 10.5 K/uL 4.9 5.4 1.9(L)  Hemoglobin 13.0 - 17.0 g/dL 12.2(L) 12.4(L) 12.9(L)  Hematocrit 39.0 - 52.0 % 35.6(L) 34.7(L) 37.7(L)  Platelets 150.0 - 400.0 K/uL 177.0 214 125(L)     CMP Latest Ref Rng & Units 03/31/2021 03/16/2021 03/04/2021  Glucose 70 - 99 mg/dL 228(H) 168(H) 193(H)  BUN 8 - 23 mg/dL 18 28(H) 32(H)  Creatinine 0.61 - 1.24 mg/dL 0.73 1.09 1.32(H)  Sodium 135 - 145 mmol/L 136 139 135  Potassium 3.5 - 5.1 mmol/L 3.7 3.5 3.5  Chloride 98 - 111 mmol/L 108 108 106  CO2 22 - 32 mmol/L 19(L) 24 24  Calcium 8.9 - 10.3 mg/dL 8.4(L) 9.0 8.8(L)  Total Protein 6.5 - 8.1 g/dL 5.4(L) 5.8(L) 6.1(L)  Total Bilirubin 0.3 - 1.2 mg/dL 0.6 0.6 0.8  Alkaline Phos 38 - 126 U/L 75 78 64  AST 15 - 41 U/L _0 ALT 0 - 44 U/L 31 32 25      RADIOGRAPHIC STUDIES: I have personally reviewed the radiological images as listed and agreed with the findings in the report. No results found.    No orders of the defined types were placed in this encounter.  All questions were answered. The patient knows to call the clinic with any problems, questions or concerns. No barriers to learning was detected. The total time spent in the appointment was 30 minutes.     Truitt Merle, MD 03/31/2021   I, Wilburn Mylar, am acting as scribe for Truitt Merle, MD.   I have reviewed the above documentation for accuracy and completeness, and I agree with the above.

## 2021-04-01 ENCOUNTER — Telehealth: Payer: Self-pay

## 2021-04-01 DIAGNOSIS — L98499 Non-pressure chronic ulcer of skin of other sites with unspecified severity: Secondary | ICD-10-CM | POA: Diagnosis not present

## 2021-04-01 LAB — T4: T4, Total: 5.6 ug/dL (ref 4.5–12.0)

## 2021-04-01 NOTE — Telephone Encounter (Signed)
-----   Message from Derrick Ravel, RN sent at 03/31/2021  4:11 PM EST ----- Regarding: First time nivolumab, Dr. Burr Medico patient. First time Nivolumab, Dr. Burr Medico patient. Please give follow up phone call tomorrow. Thank you!

## 2021-04-01 NOTE — Telephone Encounter (Signed)
Daughter Jeffrey [name] states that her father is doing well. Eating, drinking, and urinating well. No N/V. No symptoms from the treatment thus far. Jeffrey [name] knows to call the office at [phone] if she has any question or concerns regarding her father's condition.

## 2021-04-06 ENCOUNTER — Other Ambulatory Visit: Payer: Self-pay

## 2021-04-06 ENCOUNTER — Ambulatory Visit (HOSPITAL_COMMUNITY): Payer: PPO | Attending: Physician Assistant

## 2021-04-06 DIAGNOSIS — R0602 Shortness of breath: Secondary | ICD-10-CM | POA: Diagnosis not present

## 2021-04-06 LAB — ECHOCARDIOGRAM COMPLETE
Area-P 1/2: 2.85 cm2
S' Lateral: 2.8 cm

## 2021-04-08 NOTE — Progress Notes (Signed)
Subjective:   Jeffrey [name] is a 81 y.o. male who presents for Medicare Annual/Subsequent preventive examination.  I connected with Merian Capron today by telephone and verified that I am speaking with the correct person using two identifiers. Location patient: home Location provider: work Persons participating in the virtual visit: patient, daughter Clemmie Krill, nurse.    I discussed the limitations, risks, security and privacy concerns of performing an evaluation and management service by telephone and the availability of in person appointments. I also discussed with the patient that there may be a patient responsible charge related to this service. The patient expressed understanding and verbally consented to this telephonic visit.    Interactive audio and video telecommunications were attempted between this provider and patient, however failed, due to patient having technical difficulties OR patient did not have access to video capability.  We continued and completed visit with audio only.  Some vital signs may be absent or patient reported.   Time Spent with patient on telephone encounter: 25 minutes  Review of Systems     Cardiac Risk Factors include: advanced age (>57men, >37 women);diabetes mellitus;hypertension;dyslipidemia     Objective:    Today's Vitals   04/09/21 1315  Weight: 140 lb (63.5 kg)  Height: 5\' 8"  (1.727 m)   Body mass index is 21.29 kg/m.  Advanced Directives 04/09/2021 02/15/2021 01/29/2021 12/31/2020 04/08/2020 10/31/2017 03/15/2017  Does Patient Have a Medical Advance Directive? Yes Yes Yes Yes Yes No Yes  Type of Paramedic of [place];Living will [place];Living will [place];Living will [place];Living will [place];Living will - [place];Living will  Does patient want to make changes to medical advance directive? Yes  (MAU/Ambulatory/Procedural Areas - Information given) - No - Patient declined - - - -  Copy of [place] in Chart? Yes - validated most recent copy scanned in chart (See row information) No - copy requested No - copy requested No - copy requested Yes - validated most recent copy scanned in chart (See row information) - Yes  Would patient like information on creating a medical advance directive? - - - No - Patient declined - No - Patient declined -  Pre-existing out of facility DNR order (yellow form or pink MOST form) - - - - - - -    Current Medications (verified) Outpatient Encounter Medications as of 04/09/2021  Medication Sig   amoxicillin-clavulanate (AUGMENTIN) 500-125 MG tablet Take 1 tablet by mouth 2 (two) times daily.   atorvastatin (LIPITOR) 80 MG tablet Take 80 mg by mouth at bedtime.   baclofen (LIORESAL) 10 MG tablet Take 10 mg by mouth 3 (three) times daily.   cholecalciferol (VITAMIN D3) 25 MCG (1000 UNIT) tablet Take 1,000 Units by mouth daily.   clopidogrel (PLAVIX) 75 MG tablet Take 1 tablet (75 mg total) by mouth daily.   empagliflozin (JARDIANCE) 25 MG TABS tablet Take 25 mg by mouth daily.   fosinopril ([name]) 40 MG tablet Take 1 tablet (40 mg total) by mouth daily.   furosemide (LASIX) 20 MG tablet Take 20-40 mg by mouth See admin instructions. Take 20 mg on Mon, Wed, Fri, and Sun. Take 40 mg on Tues, Thurs, and Sat   insulin aspart (NOVOLOG FLEXPEN) 100 UNIT/ML FlexPen Inject 6 Units into the skin 2 (two) times daily with a meal. Breakfast and dinner   insulin glargine (LANTUS) 100 UNIT/ML Solostar Pen Inject 8 Units  into the skin at bedtime.   LACTOBACILLUS PO Take 1 capsule by mouth every Monday, Wednesday, and Friday.   loratadine (CLARITIN) 10 MG tablet Take 10 mg by mouth.   metoprolol tartrate (LOPRESSOR) 25 MG tablet Take 1 tablet (25 mg total) by mouth 2 (two) times daily.   Olopatadine HCl (PATADAY OP) Place 1 drop into both eyes daily as  needed (allergies).   pantoprazole (PROTONIX) 20 MG tablet TAKE 1 TABLET BY MOUTH EVERY DAY   Potassium 99 MG TABS Take 1 tablet (99 mg total) by mouth daily as needed (when you take 2 lasix pills).   sertraline (ZOLOFT) 25 MG tablet Take 1 tablet (25 mg total) by mouth daily.   silver sulfADIAZINE (SILVADENE) 1 % cream Apply 1 application topically daily. To sore on buttocks daily   megestrol (MEGACE ES) 625 MG/5ML suspension Take 5 mLs (625 mg total) by mouth daily. (Patient not taking: Reported on 04/09/2021)   [DISCONTINUED] prochlorperazine (COMPAZINE) 10 MG tablet Take 1 tablet (10 mg total) by mouth every 6 (six) hours as needed (Nausea or vomiting).   No facility-administered encounter medications on file as of 04/09/2021.    Allergies (verified) Niacin   History: Past Medical History:  Diagnosis Date   Anterior cerebral circulation hemorrhagic infarction [facility]) 11/24/2014   March, 2016, dominant left thalamic and left internal capsule ischemic infarct with resultant hemorrhagic transformation, secondary to small vessel disease. Resulted in right hemiparesis dysarthria and diplopia   //   readmission with aphasia July, 2016, this improved    Arthritis    CAD (coronary artery disease)    PCI distal RCA ...2004, residual 70% LAD   /   ...nuclear...03/2007...no ischemia.Marland KitchenMarland Kitchenpreserved LV /  nuclear...03/03/2009...inferior scar..no ischemia..EF 51%   Cyst of nasopharynx    per ENT Wilburn Cornelia   Dyslipidemia    takes Atorvastatin daily   GERD (gastroesophageal reflux disease)    takes Omeprazole daily   Gout    takes Allopurinol daily and Colchicine as needed   HCAP (healthcare-associated pneumonia)    Hemiparesis affecting right side as late effect of cerebrovascular accident ([place]) 09/05/2014   History of colon polyps    HTN (hypertension)    takes Cardura,Metoprolol,[name],and Amlodipine daily   Internal hemorrhoids    Myocardial infarction [facility]) 3yrs ago   Peripheral edema    takes  Lasix daily   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 [name])   Past Surgical History:  Procedure Laterality Date   BALLOON ANGIOPLASTY, ARTERY  1992, 2004   CAD, Dr. Ron Parker   CARDIAC CATHETERIZATION  2004   CATARACT EXTRACTION Bilateral 2010   COLONOSCOPY  04/03/2002   adenomatous polyp, int hemorrhoids   COLONOSCOPY  07/18/2007   normal (Dr. Fuller Plan)   COLONOSCOPY  09/26/2012   tubular adenoma, sm int hem, rpt 5 yrs Fuller Plan)   ELBOW SURGERY Right 1997   eye lids raised     NASAL SEPTUM SURGERY     POLYPECTOMY N/A 05/27/2013   Procedure: ENDOSCOPIC NASOPHARYNGEAL MASS;  Surgeon: [name], MD   ROTATOR CUFF REPAIR  09/2011   left, with subacromial decompression   TONSILLECTOMY AND ADENOIDECTOMY     Family History  Problem Relation Age of Onset   Alcohol abuse Father  Heart attack Father    Cancer Sister 87       breast cancer   Heart attack Son    Coronary artery disease Neg Hx    Stroke Neg Hx    Diabetes Neg Hx    Colon cancer Neg Hx    Social History   Socioeconomic History   Marital status: Widowed    Spouse name: Not on file   Number of children: 2   Years of education: Bachelors   Highest education level: Not on file  Occupational History   Occupation: Music therapist: [employer]  Tobacco Use   Smoking status: Former   Smokeless tobacco: Never   Tobacco comments:    quit smoking at age 60  Vaping Use   Vaping Use: Never used  Substance and Sexual Activity   Alcohol use: No   Drug use: No   Sexual activity: Yes  Other Topics Concern   Not on file  Social History Narrative   Caffeine: 6-7 cups coffee   Lives with wife, no pets   2 grown children, 4 grandchildren   Occu: Artist   Edu: [employer]  [place]   Activity: not much   Diet: plenty of water, fruits and vegetables   Some care through [place]      Endo: Dr. Buddy Duty   Social Determinants of Health   Financial Resource Strain: Low Risk    Difficulty of Paying Living Expenses: Not hard at all  Food Insecurity: No Food Insecurity   Worried About Charity fundraiser in the Last Year: Never true   [place] in the Last Year: Never true  Transportation Needs: No Transportation Needs   Lack of Transportation (Medical): No   Lack of Transportation (Non-Medical): No  Physical Activity: Insufficiently Active   Days of Exercise per Week: 2 days   Minutes of Exercise per Session: 10 min  Stress: No Stress Concern Present   Feeling of Stress : Only a little  Social Connections: Moderately Isolated   Frequency of Communication with Friends and Family: More than three times a week   Frequency of Social Gatherings with Friends and Family: More than three times a week   Attends Religious Services: More than 4 times per year   Active Member of [employer] or Organizations: No   Attends Archivist Meetings: Never   Marital Status: Widowed    Tobacco Counseling Counseling given: Not Answered Tobacco comments: quit smoking at age 11   Clinical Intake:  Pre-visit preparation completed: Yes  Pain : No/denies pain     BMI - recorded: 21.29 Nutritional Status: BMI of 19-24  Normal Nutritional Risks: Unintentional weight loss (patient finished chemotherapy 3 weeks ago) Diabetes: Yes CBG done?: No Did pt. bring in CBG monitor from home?: No  How often do you need to have someone help you when you read instructions, pamphlets, or other written materials from your doctor or pharmacy?: 1 - Never  Diabetes:  Is the patient diabetic?  Yes  If diabetic, was a CBG obtained today?  No  Did the patient bring in their glucometer from home?  No  How often do you monitor your CBG's? daily.   Financial Strains and Diabetes  Management:  Are you having any financial strains with the device, your supplies or your medication? No .  Does the patient want to be seen by Chronic Care Management for management of their diabetes?  No  Would  the patient like to be referred to a Nutritionist or for Diabetic Management?  No   Diabetic Exams:  Diabetic Eye Exam: Completed 2022.   Diabetic Foot Exam: Completed 07/31/20.    Interpreter Needed?: No  Information entered by :: Orrin Brigham LPN   Activities of Daily Living In your present state of health, do you have any difficulty performing the following activities: 04/09/2021  Hearing? Y  Vision? Y  Difficulty concentrating or making decisions? N  Walking or climbing stairs? Y  Dressing or bathing? N  Doing errands, shopping? Y  Comment daughter [place] and eating ? Y  Comment daughter prepares meals  Using the Toilet? N  In the past six months, have you accidently leaked urine? Y  Do you have problems with loss of bowel control? Y  Managing your Medications? Y  Comment daughter assist  Managing your Finances? Y  Comment daughter assist  Housekeeping or managing your Housekeeping? Y  Comment daughter assist  Some recent data might be hidden    Patient Care Team: [name], MD as PCP - General (Family Medicine) Jerline Pain, MD as PCP - Cardiology (Cardiology) Delia Chimes, NP (Inactive) as Nurse Practitioner (Nurse Practitioner) Ladene Artist, MD as Consulting Physician (Gastroenterology) Alla Feeling, NP as Nurse Practitioner (Nurse Practitioner) Truitt Merle, MD as Consulting Physician (Hematology) Truitt Merle, MD as Consulting Physician (Oncology)  Indicate any recent Medical Services you may have received from other than [facility] providers in the past year (date may be approximate).     Assessment:   This is a routine wellness examination for [place].  Hearing/Vision screen Hearing Screening - Comments:: Decrease in  hearing , patient has hearing aids Vision Screening - Comments:: Last exam 2022, has an upcoming appointment, Dr. Celene Squibb, wears glasses  Dietary issues and exercise activities discussed: Current Exercise Habits: The patient does not participate in regular exercise at present;Home exercise routine, Type of exercise: walking, Time (Minutes): 10, Frequency (Times/Week): 2, Weekly Exercise (Minutes/Week): 20, Intensity: Mild, Exercise limited by: Other - see comments (patient has had stroke and chemotherapy treatment)   Goals Addressed             This Visit's Progress    Patient Stated       Would like to maintain current routine        Depression Screen PHQ 2/9 Scores 04/09/2021 04/08/2020 01/15/2019 10/31/2017 07/27/2016 07/23/2015 12/17/2014  PHQ - 2 Score 0 0 0 0 0 0 0  PHQ- 9 Score - 0 2 0 - - -    Fall Risk Fall Risk  04/09/2021 04/08/2020 01/30/2019 01/15/2019 10/31/2017  Falls in the past year? 1 1 1  0 No  Comment - - Emmi Telephone Survey: data to providers prior to load - -  Number falls in past yr: 0 1 1 1  -  Comment - - Emmi Telephone Survey Actual Response = 2 - -  Injury with Fall? 0 0 0 0 -  Risk for fall due to : Impaired mobility History of fall(s);Medication side effect;Impaired balance/gait - - -  Follow up Falls prevention discussed Falls evaluation completed;Falls prevention discussed - - -    FALL RISK PREVENTION PERTAINING TO THE HOME:  Any stairs in or around the home? Yes  If so, are there any without handrails? No  Home free of loose throw rugs in walkways, pet beds, electrical cords, etc? Yes  Adequate lighting in your home to reduce risk of falls? Yes  ASSISTIVE DEVICES UTILIZED TO PREVENT FALLS:  Life alert? No  Use of a cane, walker or w/c? Yes , wheelchair Grab bars in the bathroom? Yes  Shower chair or bench in shower? Yes  Elevated toilet seat or a handicapped toilet? Yes   TIMED UP AND GO:  Was the test performed? No .    Cognitive  Function: Normal cognitive status assessed by  this Nurse Health Advisor. No abnormalities found.   MMSE - Mini Mental State Exam 04/08/2020 10/31/2017  Orientation to time 5 5  Orientation to Place 5 5  Registration 3 3  Attention/ Calculation 5 0  Recall 3 3  Language- name 2 objects - 0  Language- repeat 1 1  Language- follow 3 step command - 3  Language- read & follow direction - 0  Write a sentence - 0  Copy design - 0  Total score - 20        Immunizations Immunization History  Administered Date(s) Administered   Fluad Quad(high Dose 65+) 01/01/2019, 01/15/2020   Influenza Split 02/01/2012, 01/17/2013, 02/04/2014, 12/08/2014, 12/22/2015   Influenza, High Dose Seasonal PF 01/30/2018, 11/24/2020   Influenza,inj,Quad PF,6+ Mos 11/01/2010, 12/22/2015   Influenza-Unspecified 02/04/2014, 12/08/2014, 12/29/2016   PFIZER Comirnaty(Gray Top)Covid-19 Tri-Sucrose Vaccine 04/21/2020   PFIZER(Purple Top)SARS-COV-2 Vaccination 04/30/2019, 05/21/2019   Pfizer Covid-19 Vaccine Bivalent Booster 48yrs & up 01/05/2021   Pneumococcal Conjugate-13 02/04/2014   Pneumococcal Polysaccharide-23 03/07/2004, 10/11/2012   Td 04/17/2015   Tdap 05/05/2005   Zoster Recombinat (Shingrix) 10/05/2017, 04/07/2018   Zoster, Live 03/08/2011    TDAP status: Up to date  Flu Vaccine status: Up to date  Pneumococcal vaccine status: Up to date  Covid-19 vaccine status: Completed vaccines  Qualifies for Shingles Vaccine? Yes   Zostavax completed Yes   Shingrix Completed?: Yes  Screening Tests Health Maintenance  Topic Date Due   OPHTHALMOLOGY EXAM  11/30/2019   COLONOSCOPY (Pts 45-65yrs Insurance coverage will need to be confirmed)  10/31/2048 (Originally 09/26/2017)   HEMOGLOBIN A1C  04/16/2021   FOOT EXAM  07/31/2021   TETANUS/TDAP  04/16/2025   Pneumonia Vaccine 25+ Years old  Completed   INFLUENZA VACCINE  Completed   COVID-19 Vaccine  Completed   Hepatitis C Screening  Completed   Zoster  Vaccines- Shingrix  Completed   HPV VACCINES  Aged Out    Health Maintenance  Health Maintenance Due  Topic Date Due   OPHTHALMOLOGY EXAM  11/30/2019    Colorectal cancer screening: No longer required.   Lung Cancer Screening: (Low Dose CT Chest recommended if Age 29-80 years, 30 pack-year currently smoking OR have quit w/in 15years.) does not qualify.     Additional Screening:  Hepatitis C Screening: does qualify; Completed 04/08/20  Vision Screening: Recommended annual ophthalmology exams for early detection of glaucoma and other disorders of the eye. Is the patient up to date with their annual eye exam?  Yes  Who is the provider or what is the name of the office in which the patient attends annual eye exams? Dr. Celene Squibb   Dental Screening: Recommended annual dental exams for proper oral hygiene  Community Resource Referral / Chronic Care Management: CRR required this visit?  No   CCM required this visit?  No      Plan:     I have personally reviewed and noted the following in the patients chart:   Medical and social history Use of alcohol, tobacco or illicit drugs  Current medications and supplements including opioid prescriptions. Patient  is not currently taking opioid prescriptions. Functional ability and status Nutritional status Physical activity Advanced directives List of other physicians Hospitalizations, surgeries, and ER visits in previous 12 months Vitals Screenings to include cognitive, depression, and falls Referrals and appointments  In addition, I have reviewed and discussed with patient certain preventive protocols, quality metrics, and best practice recommendations. A written personalized care plan for preventive services as well as general preventive health recommendations were provided to patient.   Due to this being a telephonic visit, the after visit summary with patients personalized plan was offered to patient via mail or my-chart. Patient  would like to access on my-chart.    Loma Messing, LPN   11/11/5298   Nurse Health Advisor  Nurse Notes: none

## 2021-04-09 ENCOUNTER — Ambulatory Visit (INDEPENDENT_AMBULATORY_CARE_PROVIDER_SITE_OTHER): Payer: PPO

## 2021-04-09 VITALS — Ht 68.0 in | Wt 140.0 lb

## 2021-04-09 DIAGNOSIS — Z0000 Encounter for general adult medical examination without abnormal findings: Secondary | ICD-10-CM

## 2021-04-09 NOTE — Patient Instructions (Signed)
Jeffrey [name] , Thank you for taking time to complete your Medicare Wellness Visit. I appreciate your ongoing commitment to your health goals. Please review the following plan we discussed and let me know if I can assist you in the future.   Screening recommendations/referrals: Colonoscopy: no longer required  Recommended yearly ophthalmology/optometry visit for glaucoma screening and checkup Recommended yearly dental visit for hygiene and checkup  Vaccinations: Influenza vaccine: up to date  Pneumococcal vaccine: up to date  Tdap vaccine: up to date, completed 04/17/15, due 04/16/25 Shingles vaccine: up to date    Covid-19: up to date   Advanced directives: copy on file   Conditions/risks identified: see  problem list   Next appointment: Follow up in one year for your annual wellness visit. 04/12/22 @ 1:15pm, this will be a telephone visit   Preventive Care 79 Years and Older, Male Preventive care refers to lifestyle choices and visits with your health care provider that can promote health and wellness. What does preventive care include? A yearly physical exam. This is also called an annual well check. Dental exams once or twice a year. Routine eye exams. Ask your health care provider how often you should have your eyes checked. Personal lifestyle choices, including: Daily care of your teeth and gums. Regular physical activity. Eating a healthy diet. Avoiding tobacco and drug use. Limiting alcohol use. Practicing safe sex. Taking low doses of aspirin every day. Taking vitamin and mineral supplements as recommended by your health care provider. What happens during an annual well check? The services and screenings done by your health care provider during your annual well check will depend on your age, overall health, lifestyle risk factors, and family history of disease. Counseling  Your health care provider may ask you questions about your: Alcohol use. Tobacco use. Drug  use. Emotional well-being. Home and relationship well-being. Sexual activity. Eating habits. History of falls. Memory and ability to understand (cognition). Work and work Statistician. Screening  You may have the following tests or measurements: Height, weight, and BMI. Blood pressure. Lipid and cholesterol levels. These may be checked every 5 years, or more frequently if you are over 68 years old. Skin check. Lung cancer screening. You may have this screening every year starting at age 44 if you have a 30-pack-year history of smoking and currently smoke or have quit within the past 15 years. Fecal occult blood test (FOBT) of the stool. You may have this test every year starting at age 48. Flexible sigmoidoscopy or colonoscopy. You may have a sigmoidoscopy every 5 years or a colonoscopy every 10 years starting at age 62. Prostate cancer screening. Recommendations will vary depending on your family history and other risks. Hepatitis C blood test. Hepatitis B blood test. Sexually transmitted disease (STD) testing. Diabetes screening. This is done by checking your blood sugar (glucose) after you have not eaten for a while (fasting). You may have this done every 1-3 years. Abdominal aortic aneurysm (AAA) screening. You may need this if you are a current or former smoker. Osteoporosis. You may be screened starting at age 37 if you are at high risk. Talk with your health care provider about your test results, treatment options, and if necessary, the need for more tests. Vaccines  Your health care provider may recommend certain vaccines, such as: Influenza vaccine. This is recommended every year. Tetanus, diphtheria, and acellular pertussis (Tdap, Td) vaccine. You may need a Td booster every 10 years. Zoster vaccine. You may need this after age  60. Pneumococcal 13-valent conjugate (PCV13) vaccine. One dose is recommended after age 80. Pneumococcal polysaccharide (PPSV23) vaccine. One dose is  recommended after age 64. Talk to your health care provider about which screenings and vaccines you need and how often you need them. This information is not intended to replace advice given to you by your health care provider. Make sure you discuss any questions you have with your health care provider. Document Released: 03/20/2015 Document Revised: 11/11/2015 Document Reviewed: 12/23/2014 Elsevier Interactive Patient Education  2017 [place] Prevention in the Home Falls can cause injuries. They can happen to people of all ages. There are many things you can do to make your home safe and to help prevent falls. What can I do on the outside of my home? Regularly fix the edges of walkways and driveways and fix any cracks. Remove anything that might make you trip as you walk through a door, such as a raised step or threshold. Trim any bushes or trees on the path to your home. Use bright outdoor lighting. Clear any walking paths of anything that might make someone trip, such as rocks or tools. Regularly check to see if handrails are loose or broken. Make sure that both sides of any steps have handrails. Any raised decks and porches should have guardrails on the edges. Have any leaves, snow, or ice cleared regularly. Use sand or salt on walking paths during winter. Clean up any spills in your garage right away. This includes oil or grease spills. What can I do in the bathroom? Use night lights. Install grab bars by the toilet and in the tub and shower. Do not use towel bars as grab bars. Use non-skid mats or decals in the tub or shower. If you need to sit down in the shower, use a plastic, non-slip stool. Keep the floor dry. Clean up any water that spills on the floor as soon as it happens. Remove soap buildup in the tub or shower regularly. Attach bath mats securely with double-sided non-slip rug tape. Do not have throw rugs and other things on the floor that can make you  trip. What can I do in the bedroom? Use night lights. Make sure that you have a light by your bed that is easy to reach. Do not use any sheets or blankets that are too big for your bed. They should not hang down onto the floor. Have a firm chair that has side arms. You can use this for support while you get dressed. Do not have throw rugs and other things on the floor that can make you trip. What can I do in the kitchen? Clean up any spills right away. Avoid walking on wet floors. Keep items that you use a lot in easy-to-reach places. If you need to reach something above you, use a strong step stool that has a grab bar. Keep electrical cords out of the way. Do not use floor polish or wax that makes floors slippery. If you must use wax, use non-skid floor wax. Do not have throw rugs and other things on the floor that can make you trip. What can I do with my stairs? Do not leave any items on the stairs. Make sure that there are handrails on both sides of the stairs and use them. Fix handrails that are broken or loose. Make sure that handrails are as long as the stairways. Check any carpeting to make sure that it is firmly attached to the stairs. Fix  any carpet that is loose or worn. Avoid having throw rugs at the top or bottom of the stairs. If you do have throw rugs, attach them to the floor with carpet tape. Make sure that you have a light switch at the top of the stairs and the bottom of the stairs. If you do not have them, ask someone to add them for you. What else can I do to help prevent falls? Wear shoes that: Do not have high heels. Have rubber bottoms. Are comfortable and fit you well. Are closed at the toe. Do not wear sandals. If you use a stepladder: Make sure that it is fully opened. Do not climb a closed stepladder. Make sure that both sides of the stepladder are locked into place. Ask someone to hold it for you, if possible. Clearly mark and make sure that you can  see: Any grab bars or handrails. First and last steps. Where the edge of each step is. Use tools that help you move around (mobility aids) if they are needed. These include: Canes. Walkers. Scooters. Crutches. Turn on the lights when you go into a dark area. Replace any light bulbs as soon as they burn out. Set up your furniture so you have a clear path. Avoid moving your furniture around. If any of your floors are uneven, fix them. If there are any pets around you, be aware of where they are. Review your medicines with your doctor. Some medicines can make you feel dizzy. This can increase your chance of falling. Ask your doctor what other things that you can do to help prevent falls. This information is not intended to replace advice given to you by your health care provider. Make sure you discuss any questions you have with your health care provider. Document Released: 12/18/2008 Document Revised: 07/30/2015 Document Reviewed: 03/28/2014 Elsevier Interactive Patient Education  2017 [employer].

## 2021-04-11 ENCOUNTER — Other Ambulatory Visit: Payer: Self-pay | Admitting: Family Medicine

## 2021-04-12 ENCOUNTER — Other Ambulatory Visit: Payer: PPO

## 2021-04-12 ENCOUNTER — Other Ambulatory Visit: Payer: Self-pay

## 2021-04-15 ENCOUNTER — Inpatient Hospital Stay (HOSPITAL_BASED_OUTPATIENT_CLINIC_OR_DEPARTMENT_OTHER): Payer: PPO | Admitting: Hematology

## 2021-04-15 ENCOUNTER — Encounter: Payer: Self-pay | Admitting: Hematology

## 2021-04-15 ENCOUNTER — Inpatient Hospital Stay: Payer: PPO

## 2021-04-15 ENCOUNTER — Other Ambulatory Visit: Payer: Self-pay

## 2021-04-15 ENCOUNTER — Inpatient Hospital Stay: Payer: PPO | Attending: Nurse Practitioner

## 2021-04-15 VITALS — BP 120/66 | HR 74 | Temp 98.6°F | Resp 17 | Ht 68.0 in | Wt 138.6 lb

## 2021-04-15 DIAGNOSIS — I252 Old myocardial infarction: Secondary | ICD-10-CM | POA: Diagnosis not present

## 2021-04-15 DIAGNOSIS — C158 Malignant neoplasm of overlapping sites of esophagus: Secondary | ICD-10-CM

## 2021-04-15 DIAGNOSIS — C160 Malignant neoplasm of cardia: Secondary | ICD-10-CM | POA: Insufficient documentation

## 2021-04-15 DIAGNOSIS — R197 Diarrhea, unspecified: Secondary | ICD-10-CM | POA: Insufficient documentation

## 2021-04-15 DIAGNOSIS — R350 Frequency of micturition: Secondary | ICD-10-CM | POA: Insufficient documentation

## 2021-04-15 DIAGNOSIS — Z79899 Other long term (current) drug therapy: Secondary | ICD-10-CM | POA: Insufficient documentation

## 2021-04-15 DIAGNOSIS — E1140 Type 2 diabetes mellitus with diabetic neuropathy, unspecified: Secondary | ICD-10-CM | POA: Insufficient documentation

## 2021-04-15 DIAGNOSIS — I10 Essential (primary) hypertension: Secondary | ICD-10-CM | POA: Insufficient documentation

## 2021-04-15 DIAGNOSIS — Z5112 Encounter for antineoplastic immunotherapy: Secondary | ICD-10-CM | POA: Diagnosis not present

## 2021-04-15 DIAGNOSIS — C155 Malignant neoplasm of lower third of esophagus: Secondary | ICD-10-CM

## 2021-04-15 LAB — CMP (CANCER CENTER ONLY)
ALT: 31 U/L (ref 0–44)
AST: 21 U/L (ref 15–41)
Albumin: 3.3 g/dL — ABNORMAL LOW (ref 3.5–5.0)
Alkaline Phosphatase: 110 U/L (ref 38–126)
Anion gap: 6 (ref 5–15)
BUN: 16 mg/dL (ref 8–23)
CO2: 25 mmol/L (ref 22–32)
Calcium: 8.7 mg/dL — ABNORMAL LOW (ref 8.9–10.3)
Chloride: 107 mmol/L (ref 98–111)
Creatinine: 0.65 mg/dL (ref 0.61–1.24)
GFR, Estimated: >60 mL/min (ref >60)
Glucose, Bld: 178 mg/dL — ABNORMAL HIGH (ref 70–99)
Potassium: 3.4 mmol/L — ABNORMAL LOW (ref 3.5–5.1)
Sodium: 138 mmol/L (ref 135–145)
Total Bilirubin: 0.8 mg/dL (ref 0.3–1.2)
Total Protein: 6.1 g/dL — ABNORMAL LOW (ref 6.5–8.1)

## 2021-04-15 LAB — CBC WITH DIFFERENTIAL (CANCER CENTER ONLY)
Abs Immature Granulocytes: 0.02 10*3/uL (ref 0.00–0.07)
Basophils Absolute: 0 10*3/uL (ref 0.0–0.1)
Basophils Relative: 1 %
Eosinophils Absolute: 0 10*3/uL (ref 0.0–0.5)
Eosinophils Relative: 1 %
HCT: 33.8 % — ABNORMAL LOW (ref 39.0–52.0)
Hemoglobin: 11.6 g/dL — ABNORMAL LOW (ref 13.0–17.0)
Immature Granulocytes: 0 %
Lymphocytes Relative: 16 %
Lymphs Abs: 1 10*3/uL (ref 0.7–4.0)
MCH: 31.5 pg (ref 26.0–34.0)
MCHC: 34.3 g/dL (ref 30.0–36.0)
MCV: 91.8 fL (ref 80.0–100.0)
Monocytes Absolute: 0.6 10*3/uL (ref 0.1–1.0)
Monocytes Relative: 9 %
Neutro Abs: 4.2 10*3/uL (ref 1.7–7.7)
Neutrophils Relative %: 73 %
Platelet Count: 186 10*3/uL (ref 150–400)
RBC: 3.68 MIL/uL — ABNORMAL LOW (ref 4.22–5.81)
RDW: 16.9 % — ABNORMAL HIGH (ref 11.5–15.5)
WBC Count: 5.8 10*3/uL (ref 4.0–10.5)
nRBC: 0 % (ref 0.0–0.2)

## 2021-04-15 LAB — CEA (IN HOUSE-CHCC): CEA (CHCC-In House): 1.91 ng/mL (ref 0.00–5.00)

## 2021-04-15 LAB — TSH: TSH: 0.449 u[IU]/mL (ref 0.320–4.118)

## 2021-04-15 MED ORDER — SODIUM CHLORIDE 0.9 % IV SOLN: Freq: Once | INTRAVENOUS | Status: AC

## 2021-04-15 MED ORDER — SODIUM CHLORIDE 0.9 % IV SOLN
240.0000 mg | Freq: Once | INTRAVENOUS | Status: AC
  Administered 2021-04-15: 240 mg via INTRAVENOUS
  Filled 2021-04-15: qty 24

## 2021-04-15 NOTE — Progress Notes (Signed)
[facility]   [phone] [phone]   Clinic Follow up Note   Patient Care Team: [name], MD as PCP - General (Family Medicine) Jerline Pain, MD as PCP - Cardiology (Cardiology) Delia Chimes, NP (Inactive) as Nurse Practitioner (Nurse Practitioner) Ladene Artist, MD as Consulting Physician (Gastroenterology) Alla Feeling, NP as Nurse Practitioner (Nurse Practitioner) Truitt Merle, MD as Consulting Physician (Hematology) Truitt Merle, MD as Consulting Physician (Oncology)  Date of Service:  04/15/2021  CHIEF COMPLAINT: f/u of esophageal cancer  CURRENT THERAPY:  Nivolumab, q2weeks, starting 03/31/21  ASSESSMENT & PLAN:  Jeffrey [name] is a 81 y.o. male with   1. Adenocarcinoma of the GE junction, cTX N1 M0, HER2 negative, MMR normal, PD-L1 CPS 3% -He presented with 6-8 months of dysphagia and 10 pounds weight loss and a palpable right cervical lymph node. Initial CT neck 10/22/20 showed 2.8 cm LN, concerning for metastatic disease.  -CT 12/08/20 showed suspicious mass at GE junction and prominent gastrohepatic LNs. EGD 12/17/20 by Dr. Fuller Plan showed 5 cm distal esophageal mass, biopsy confirmed adenocarcinoma with signet ring features. HER 2 negative -PET scan 12/30/20 showed FDG-avid mass at the GE junction and mild uptake with borderline gastrohepatic ligament lymph nodes, no distant metastasis. -He began concurrent chemo RT with weekly carbo/Taxol 01/18/21, C1 carbo was dose reduced to AUC 1.5 for age and co-morbidities. He tolerated moderately well with fatigue, decreased appetite/taste change, nausea, and constipation. -cervical lymph node biopsy on 01/29/21 showed atypical cells.  -he completed chemo on 12/19 and radiation on 02/26/21. He had slow recovery -he began nivolumab on 03/31/21; he tolerated well, no noticeable side effects. Labs reviewed, overall stable, adequate to proceed with second dose today. -plan to repeat PET in late  March or early April  -we also discuss repeating EGD if he has persistent dysphagia (intermittent now)    2. Symptom Management: Weight loss, shortness of breath -Continue follow-up with dietitian -he had worsening SOB since completing radiation. CT angio chest in the ED on 03/04/21 was negative. He reports SOB has been improving, along with his strength.   3. Goals of care -He understands if work-up confirms metastatic disease, it will not be curable but still treatable, treatment will be palliative   4. Comorbidities: CVA with residual right-sided weakness, history of MI, CAD, HTN, DM, HL -Patient lives alone, his daughter checks on him frequently to help with housework and food prep -He is limited in his ADLs due to residual right-sided weakness from CVA -He ambulates with support, can bathe and dress with difficulty     PLAN: -proceed with cycle 2 Nivo today -lab, f/u, and nivo every 2 weeks x3 -will reach out dietician Barb for f/u    No problem-specific Assessment & Plan notes found for this encounter.   SUMMARY OF ONCOLOGIC HISTORY: Oncology History Overview Note  Cancer Staging Esophageal cancer [facility]) Staging form: Esophagus - Adenocarcinoma, AJCC 8th Edition - Clinical stage from 12/31/2020: Stage IIA (cT1, cN1, cM0, GX) - Unsigned    Esophageal cancer ([place])  10/22/2020 Imaging   CT neck IMPRESSION: 2.8 cm right level 2A lymph node, which appears to be centrally necrotic, concerning for metastatic disease. No additional lymphadenopathy or other acute process in the neck.     11/30/2020 Imaging   Barium swallow FINDINGS: Limitations on positioning due to sequelae of stroke. The patient swallowed barium without difficulty. No aspiration. There is no mass or stricture. Normal motility. There is no  hiatal hernia. No spontaneous gastroesophageal reflux. Barium pill was swallowed and passed without difficulty.   IMPRESSION: No significant abnormality.   12/08/2020  Imaging   CT chest abdomen with contrast IMPRESSION: 1. Mass is suspected just at the level of the GE junction. Suspicious for primary esophageal neoplasm. Further evaluation with endoscopy is advised. 2. Prominent perigastric lymph nodes are identified, suspicious for metastatic adenopathy. 3. Gallstone. 4. Coronary artery calcifications. 5. Aortic Atherosclerosis (ICD10-I70.0) and Emphysema (ICD10-J43.9). 6. These results will be called to the ordering clinician or representative by the Radiologist Assistant, and communication documented in the PACS or [employer].     12/17/2020 Procedure   EGD by Dr. Fuller Plan - A medium-sized, fungating mass with contact bleeding was found in the distal esophagus, 36 cm from the incisors extending to 41 cm in the cardia. The mass was partially obstructing and circumferential.   12/17/2020 Initial Biopsy   Diagnosis Esophagus, biopsy, distal and gastric cardia mass - ADENOCARCINOMA WITH SIGNET RING FEATURES.   12/19/2020 Initial Diagnosis   Esophageal cancer ([place])   12/30/2020 PET scan   IMPRESSION: 1. Mass at the level of the GE junction and proximal stomach is FDG avid within SUV max of 7.62. Findings compatible with primary gastric neoplasm. 2. There is mild FDG uptake associated with borderline gastrohepatic ligament lymph nodes which have an SUV max of 2.97. Equivocal for nodal metastasis. 3. No signs of distant metastatic disease. 4. Aortic Atherosclerosis (ICD10-I70.0) and Emphysema (ICD10-J43.9). Coronary artery calcifications. 5. Gallstones. 6. Prostate gland enlargement.   01/08/2021 Imaging   EXAM: ULTRASOUND OF HEAD/NECK SOFT TISSUES  IMPRESSION: Sonographic evaluation of the right-side of the neck confirms a minimally complex cystic lesion with broad differential considerations including cystic/necrotic cervical lymphadenopathy (favored given presence of an additional smaller apparently partially cystic right cervical  lymph node), compatible with the findings on preceding PET-CT.   This minimally complex cystic structure/mass could undergo ultrasound-guided biopsy/aspiration as indicated.   01/18/2021 - 02/22/2021 Chemotherapy   Patient is on Treatment Plan : ESOPHAGUS Carboplatin/PACLitaxel weekly x 6 weeks with XRT       01/29/2021 Pathology Results   A. RIGHT, SUBMANDIBULAR CYST, FINE NEEDLE  ASPIRATION:   FINAL MICROSCOPIC DIAGNOSIS:  - Atypical cells present DIAGNOSTIC COMMENTS:  There are clusters of epithelioid cells with nuclear grooves and  pseudoinclusions admixed with pigmented histiocytes.  There is  insufficient material for ancillary studies.  Excisional biopsy may be  of interest.   03/31/2021 -  Chemotherapy   Patient is on Treatment Plan : HEAD/NECK Nivolumab q14d        INTERVAL HISTORY:  NYZIER BOIVIN is here for a follow up of esophageal cancer. He was last seen by me on 03/31/21. He presents to the clinic accompanied by his daughter. He reports he tolerated his first nivolumab with no significant side effects.   All other systems were reviewed with the patient and are negative.  MEDICAL HISTORY:  Past Medical History:  Diagnosis Date   Anterior cerebral circulation hemorrhagic infarction [facility]) 11/24/2014   March, 2016, dominant left thalamic and left internal capsule ischemic infarct with resultant hemorrhagic transformation, secondary to small vessel disease. Resulted in right hemiparesis dysarthria and diplopia   //   readmission with aphasia July, 2016, this improved    Arthritis    CAD (coronary artery disease)    PCI distal RCA ...2004, residual 70% LAD   /   ...nuclear...03/2007...no ischemia.Marland KitchenMarland Kitchenpreserved LV /  nuclear...03/03/2009...inferior scar..no ischemia..EF 51%   Cyst of  nasopharynx    per ENT Wilburn Cornelia   Dyslipidemia    takes Atorvastatin daily   GERD (gastroesophageal reflux disease)    takes Omeprazole daily   Gout    takes Allopurinol daily and  Colchicine as needed   HCAP (healthcare-associated pneumonia)    Hemiparesis affecting right side as late effect of cerebrovascular accident ([place]) 09/05/2014   History of colon polyps    HTN (hypertension)    takes Cardura,Metoprolol,[name],and Amlodipine daily   Internal hemorrhoids    Myocardial infarction [facility] 32yr ago   Peripheral edema    takes Lasix daily   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    SURGICAL HISTORY: Past Surgical History:  Procedure Laterality Date   BALLOON ANGIOPLASTY, ARTERY  1992, 2004   CAD, Dr. KRon Parker  CARDIAC CATHETERIZATION  2004   CATARACT EXTRACTION Bilateral 2010   COLONOSCOPY  04/03/2002   adenomatous polyp, int hemorrhoids   COLONOSCOPY  07/18/2007   normal (Dr. SFuller Plan   COLONOSCOPY  09/26/2012   tubular adenoma, sm int hem, rpt 5 yrs (Fuller Plan   ELBOW SURGERY Right 1997   eye lids raised     NASAL SEPTUM SURGERY     POLYPECTOMY N/A 05/27/2013   Procedure: ENDOSCOPIC NASOPHARYNGEAL MASS;  Surgeon: DJerrell Belfast MD   ROTATOR CUFF REPAIR  09/2011   left, with subacromial decompression   TONSILLECTOMY AND ADENOIDECTOMY      I have reviewed the social history and family history with the patient and they are unchanged from previous note.  ALLERGIES:  is allergic to niacin.  MEDICATIONS:  Current Outpatient Medications  Medication Sig Dispense Refill   amoxicillin-clavulanate (AUGMENTIN) 500-125 MG tablet Take 1 tablet by mouth 2 (two) times daily.     atorvastatin (LIPITOR) 80 MG tablet Take 80 mg by mouth at bedtime.     baclofen (LIORESAL) 10 MG tablet Take 10 mg by mouth 3 (three) times daily.     cholecalciferol (VITAMIN D3) 25 MCG (1000 UNIT) tablet Take 1,000 Units by mouth daily.     clopidogrel (PLAVIX)  75 MG tablet Take 1 tablet (75 mg total) by mouth daily.     empagliflozin (JARDIANCE) 25 MG TABS tablet Take 25 mg by mouth daily.     fosinopril ([name]) 40 MG tablet Take 1 tablet (40 mg total) by mouth daily.     furosemide (LASIX) 20 MG tablet Take 20-40 mg by mouth See admin instructions. Take 20 mg on Mon, Wed, Fri, and Sun. Take 40 mg on Tues, Thurs, and Sat     insulin aspart (NOVOLOG FLEXPEN) 100 UNIT/ML FlexPen Inject 6 Units into the skin 2 (two) times daily with a meal. Breakfast and dinner     insulin glargine (LANTUS) 100 UNIT/ML Solostar Pen Inject 8 Units into the skin at bedtime.     LACTOBACILLUS PO Take 1 capsule by mouth every Monday, Wednesday, and Friday.     loratadine (CLARITIN) 10 MG tablet Take 10 mg by mouth.     megestrol (MEGACE ES) 625 MG/5ML suspension Take 5 mLs (625 mg total) by mouth daily. (Patient not taking: Reported on 04/09/2021) 150 mL 2   metoprolol tartrate (LOPRESSOR) 25 MG tablet Take 1 tablet (25 mg total) by mouth  2 (two) times daily.     Olopatadine HCl (PATADAY OP) Place 1 drop into both eyes daily as needed (allergies).     pantoprazole (PROTONIX) 20 MG tablet TAKE 1 TABLET BY MOUTH EVERY DAY 90 tablet 0   Potassium 99 MG TABS Take 1 tablet (99 mg total) by mouth daily as needed (when you take 2 lasix pills).     sertraline (ZOLOFT) 25 MG tablet Take 1 tablet (25 mg total) by mouth daily.     silver sulfADIAZINE (SILVADENE) 1 % cream Apply 1 application topically daily. To sore on buttocks daily 50 g 0   No current facility-administered medications for this visit.    PHYSICAL EXAMINATION: ECOG PERFORMANCE STATUS: 3 - Symptomatic, >50% confined to bed  Vitals:   04/15/21 1236  BP: 120/66  Pulse: 74  Resp: 17  Temp: 98.6 F (37 C)  SpO2: 100%   Wt Readings from Last 3 Encounters:  04/15/21 138 lb 9.6 oz (62.9 kg)  04/09/21 140 lb (63.5 kg)  03/31/21 140 lb 14.4 oz (63.9 kg)     GENERAL:alert, no distress and comfortable SKIN: skin  color normal, no rashes or significant lesions EYES: normal, Conjunctiva are pink and non-injected, sclera clear  NEURO: alert & oriented x 3 with fluent speech  LABORATORY DATA:  I have reviewed the data as listed CBC Latest Ref Rng & Units 04/15/2021 03/22/2021 03/16/2021  WBC 4.0 - 10.5 K/uL 5.8 4.9 5.4  Hemoglobin 13.0 - 17.0 g/dL 11.6(L) 12.2(L) 12.4(L)  Hematocrit 39.0 - 52.0 % 33.8(L) 35.6(L) 34.7(L)  Platelets 150 - 400 K/uL 186 177.0 214     CMP Latest Ref Rng & Units 04/15/2021 03/31/2021 03/16/2021  Glucose 70 - 99 mg/dL 178(H) 228(H) 168(H)  BUN 8 - 23 mg/dL 16 18 28(H)  Creatinine 0.61 - 1.24 mg/dL 0.65 0.73 1.09  Sodium 135 - 145 mmol/L 138 136 139  Potassium 3.5 - 5.1 mmol/L 3.4(L) 3.7 3.5  Chloride 98 - 111 mmol/L 107 108 108  CO2 22 - 32 mmol/L 25 19(L) 24  Calcium 8.9 - 10.3 mg/dL 8.7(L) 8.4(L) 9.0  Total Protein 6.5 - 8.1 g/dL 6.1(L) 5.4(L) 5.8(L)  Total Bilirubin 0.3 - 1.2 mg/dL 0.8 0.6 0.6  Alkaline Phos 38 - 126 U/L 110 75 78  AST 15 - 41 U/L _0 ALT 0 - 44 U/L 31 31 32      RADIOGRAPHIC STUDIES: I have personally reviewed the radiological images as listed and agreed with the findings in the report. No results found.    No orders of the defined types were placed in this encounter.  All questions were answered. The patient knows to call the clinic with any problems, questions or concerns. No barriers to learning was detected. The total time spent in the appointment was 30 minutes.     Truitt Merle, MD 04/15/2021   I, Wilburn Mylar, am acting as scribe for Truitt Merle, MD.   I have reviewed the above documentation for accuracy and completeness, and I agree with the above.

## 2021-04-15 NOTE — Patient Instructions (Signed)
[facility]  Discharge Instructions: Thank you for choosing [facility] to provide your oncology and hematology care.   If you have a lab appointment with the [facility], please go directly to the [facility] and check in at the registration area.   Wear comfortable clothing and clothing appropriate for easy access to any Portacath or PICC line.   We strive to give you quality time with your provider. You may need to reschedule your appointment if you arrive late (15 or more minutes).  Arriving late affects you and other patients whose appointments are after yours.  Also, if you miss three or more appointments without notifying the office, you may be dismissed from the clinic at the providers discretion.      For prescription refill requests, have your pharmacy contact our office and allow 72 hours for refills to be completed.    Today you received the following chemotherapy and/or immunotherapy agents: Opdivo (nivolumab)      To help prevent nausea and vomiting after your treatment, we encourage you to take your nausea medication as directed.  BELOW ARE SYMPTOMS THAT SHOULD BE REPORTED IMMEDIATELY: *FEVER GREATER THAN 100.4 F (38 C) OR HIGHER *CHILLS OR SWEATING *NAUSEA AND VOMITING THAT IS NOT CONTROLLED WITH YOUR NAUSEA MEDICATION *UNUSUAL SHORTNESS OF BREATH *UNUSUAL BRUISING OR BLEEDING *URINARY PROBLEMS (pain or burning when urinating, or frequent urination) *BOWEL PROBLEMS (unusual diarrhea, constipation, pain near the anus) TENDERNESS IN MOUTH AND THROAT WITH OR WITHOUT PRESENCE OF ULCERS (sore throat, sores in mouth, or a toothache) UNUSUAL RASH, SWELLING OR PAIN  UNUSUAL VAGINAL DISCHARGE OR ITCHING   Items with * indicate a potential emergency and should be followed up as soon as possible or go to the Emergency Department if any problems should occur.  Please show the CHEMOTHERAPY ALERT CARD or IMMUNOTHERAPY ALERT CARD at  check-in to the Emergency Department and triage nurse.  Should you have questions after your visit or need to cancel or reschedule your appointment, please contact [facility]  Dept: [phone]  and follow the prompts.  Office hours are 8:00 a.m. to 4:30 p.m. Monday - Friday. Please note that voicemails left after 4:00 p.m. may not be returned until the following business day.  We are closed weekends and major holidays. You have access to a nurse at all times for urgent questions. Please call the main number to the clinic Dept: [phone] and follow the prompts.   For any non-urgent questions, you may also contact your provider using MyChart. We now offer e-Visits for anyone 81 and older to request care online for non-urgent symptoms. For details visit [email or web address].   Also download the MyChart app! Go to the app store, search "MyChart", open the app, select [facility], and log in with your MyChart username and password.  Due to Covid, a mask is required upon entering the hospital/clinic. If you do not have a mask, one will be given to you upon arrival. For doctor visits, patients may have 1 support person aged 80 or older with them. For treatment visits, patients cannot have anyone with them due to current Covid guidelines and our immunocompromised population.

## 2021-04-16 LAB — T4: T4, Total: 5.6 ug/dL (ref 4.5–12.0)

## 2021-04-19 ENCOUNTER — Inpatient Hospital Stay: Payer: PPO

## 2021-04-19 ENCOUNTER — Encounter: Payer: PPO | Admitting: Family Medicine

## 2021-04-19 NOTE — Progress Notes (Signed)
Nutrition Follow-up:  Message received from Dr Burr Medico to call daughter.   Patient with esophageal cancer.  Patient completed chemotherapy and radiation in 02/2021.  Currently receiving nivolumab.    Called and spoke with daughter, [name] via phone.  Dtr reports that patient's appetite is better, has restarted megace.  Says that sometimes foods go down and other times they don't.  Says that patient's taste is coming back.  Drinking 2 ensure complete daily (350 calories and 30 g protein).  Yesterday patient was able to eat chicken, green beans and potatoes without difficulty.  Likes hard boiled eggs, soups.  Daughter concerned about sodium content in foods.     Medications: reviewed  Labs: reviewed  Anthropometrics:   Weight 138 lb 9.6 oz on 2/9  148 lb 11.2 on 12/19   NUTRITION DIAGNOSIS: Inadequate oral intake related to cancer and cancer related treatment side effects as evidenced by 7% weight loss in the last 2 months and decreased intake   INTERVENTION:  Discussed ways to add calories and protein in diet.  Would liberalize diet at this time with weight loss.   Encouraged patient to continues ensure complete 2-3 times per day Chop, grind, puree foods and add liquid as needed for ease of swallowing Contact information provided.  Daughter prefers to call RD if has more questions or concerns.    NEXT VISIT: no follow-up Dtr will reach out if needed  Waylan Busta B. Zenia Resides, [place], [place] Registered Dietitian [phone] (mobile)

## 2021-04-27 DIAGNOSIS — Z794 Long term (current) use of insulin: Secondary | ICD-10-CM | POA: Diagnosis not present

## 2021-04-27 DIAGNOSIS — E1142 Type 2 diabetes mellitus with diabetic polyneuropathy: Secondary | ICD-10-CM | POA: Diagnosis not present

## 2021-04-27 DIAGNOSIS — Z7984 Long term (current) use of oral hypoglycemic drugs: Secondary | ICD-10-CM | POA: Diagnosis not present

## 2021-04-27 DIAGNOSIS — R609 Edema, unspecified: Secondary | ICD-10-CM | POA: Diagnosis not present

## 2021-04-27 DIAGNOSIS — E1165 Type 2 diabetes mellitus with hyperglycemia: Secondary | ICD-10-CM | POA: Diagnosis not present

## 2021-04-27 DIAGNOSIS — Z8673 Personal history of transient ischemic attack (TIA), and cerebral infarction without residual deficits: Secondary | ICD-10-CM | POA: Diagnosis not present

## 2021-04-27 DIAGNOSIS — E785 Hyperlipidemia, unspecified: Secondary | ICD-10-CM | POA: Diagnosis not present

## 2021-04-27 DIAGNOSIS — E11319 Type 2 diabetes mellitus with unspecified diabetic retinopathy without macular edema: Secondary | ICD-10-CM | POA: Diagnosis not present

## 2021-04-27 DIAGNOSIS — I10 Essential (primary) hypertension: Secondary | ICD-10-CM | POA: Diagnosis not present

## 2021-04-27 LAB — HEMOGLOBIN A1C: Hemoglobin A1C: 6.3

## 2021-04-28 ENCOUNTER — Inpatient Hospital Stay: Payer: PPO

## 2021-04-28 ENCOUNTER — Encounter: Payer: Self-pay | Admitting: Hematology

## 2021-04-28 ENCOUNTER — Other Ambulatory Visit: Payer: Self-pay

## 2021-04-28 ENCOUNTER — Inpatient Hospital Stay (HOSPITAL_BASED_OUTPATIENT_CLINIC_OR_DEPARTMENT_OTHER): Payer: PPO | Admitting: Hematology

## 2021-04-28 VITALS — BP 129/72 | HR 65 | Temp 98.7°F | Resp 17 | Ht 68.0 in | Wt 139.7 lb

## 2021-04-28 DIAGNOSIS — Z5112 Encounter for antineoplastic immunotherapy: Secondary | ICD-10-CM | POA: Diagnosis not present

## 2021-04-28 DIAGNOSIS — C158 Malignant neoplasm of overlapping sites of esophagus: Secondary | ICD-10-CM | POA: Diagnosis not present

## 2021-04-28 DIAGNOSIS — C155 Malignant neoplasm of lower third of esophagus: Secondary | ICD-10-CM

## 2021-04-28 LAB — CBC WITH DIFFERENTIAL (CANCER CENTER ONLY)
Abs Immature Granulocytes: 0.02 10*3/uL (ref 0.00–0.07)
Basophils Absolute: 0 10*3/uL (ref 0.0–0.1)
Basophils Relative: 0 %
Eosinophils Absolute: 0 10*3/uL (ref 0.0–0.5)
Eosinophils Relative: 0 %
HCT: 34 % — ABNORMAL LOW (ref 39.0–52.0)
Hemoglobin: 11.5 g/dL — ABNORMAL LOW (ref 13.0–17.0)
Immature Granulocytes: 0 %
Lymphocytes Relative: 14 %
Lymphs Abs: 1 10*3/uL (ref 0.7–4.0)
MCH: 31.8 pg (ref 26.0–34.0)
MCHC: 33.8 g/dL (ref 30.0–36.0)
MCV: 93.9 fL (ref 80.0–100.0)
Monocytes Absolute: 0.6 10*3/uL (ref 0.1–1.0)
Monocytes Relative: 9 %
Neutro Abs: 5.4 10*3/uL (ref 1.7–7.7)
Neutrophils Relative %: 77 %
Platelet Count: 163 10*3/uL (ref 150–400)
RBC: 3.62 MIL/uL — ABNORMAL LOW (ref 4.22–5.81)
RDW: 15.5 % (ref 11.5–15.5)
WBC Count: 7.1 10*3/uL (ref 4.0–10.5)
nRBC: 0 % (ref 0.0–0.2)

## 2021-04-28 LAB — CMP (CANCER CENTER ONLY)
ALT: 19 U/L (ref 0–44)
AST: 11 U/L — ABNORMAL LOW (ref 15–41)
Albumin: 3.3 g/dL — ABNORMAL LOW (ref 3.5–5.0)
Alkaline Phosphatase: 77 U/L (ref 38–126)
Anion gap: 7 (ref 5–15)
BUN: 29 mg/dL — ABNORMAL HIGH (ref 8–23)
CO2: 23 mmol/L (ref 22–32)
Calcium: 8.9 mg/dL (ref 8.9–10.3)
Chloride: 109 mmol/L (ref 98–111)
Creatinine: 0.88 mg/dL (ref 0.61–1.24)
GFR, Estimated: >60 mL/min (ref >60)
Glucose, Bld: 265 mg/dL — ABNORMAL HIGH (ref 70–99)
Potassium: 3.9 mmol/L (ref 3.5–5.1)
Sodium: 139 mmol/L (ref 135–145)
Total Bilirubin: 0.5 mg/dL (ref 0.3–1.2)
Total Protein: 5.8 g/dL — ABNORMAL LOW (ref 6.5–8.1)

## 2021-04-28 LAB — TSH: TSH: 0.594 u[IU]/mL (ref 0.320–4.118)

## 2021-04-28 MED ORDER — SODIUM CHLORIDE 0.9 % IV SOLN: Freq: Once | INTRAVENOUS | Status: AC

## 2021-04-28 MED ORDER — SODIUM CHLORIDE 0.9 % IV SOLN
240.0000 mg | Freq: Once | INTRAVENOUS | Status: AC
  Administered 2021-04-28: 240 mg via INTRAVENOUS
  Filled 2021-04-28: qty 24

## 2021-04-28 NOTE — Patient Instructions (Signed)
[facility]  Discharge Instructions: ?Thank you for choosing [facility] to provide your oncology and hematology care.  ? ?If you have a lab appointment with the Cancer Center, please go directly to the Cancer Center and check in at the registration area. ?  ?Wear comfortable clothing and clothing appropriate for easy access to any Portacath or PICC line.  ? ?We strive to give you quality time with your provider. You may need to reschedule your appointment if you arrive late (15 or more minutes).  Arriving late affects you and other patients whose appointments are after yours.  Also, if you miss three or more appointments without notifying the office, you may be dismissed from the clinic at the provider?s discretion.    ?  ?For prescription refill requests, have your pharmacy contact our office and allow 72 hours for refills to be completed.   ? ?Today you received the following chemotherapy and/or immunotherapy agents Opdivo    ?  ?To help prevent nausea and vomiting after your treatment, we encourage you to take your nausea medication as directed. ? ?BELOW ARE SYMPTOMS THAT SHOULD BE REPORTED IMMEDIATELY: ?*FEVER GREATER THAN 100.4 F (38 ?C) OR HIGHER ?*CHILLS OR SWEATING ?*NAUSEA AND VOMITING THAT IS NOT CONTROLLED WITH YOUR NAUSEA MEDICATION ?*UNUSUAL SHORTNESS OF BREATH ?*UNUSUAL BRUISING OR BLEEDING ?*URINARY PROBLEMS (pain or burning when urinating, or frequent urination) ?*BOWEL PROBLEMS (unusual diarrhea, constipation, pain near the anus) ?TENDERNESS IN MOUTH AND THROAT WITH OR WITHOUT PRESENCE OF ULCERS (sore throat, sores in mouth, or a toothache) ?UNUSUAL RASH, SWELLING OR PAIN  ?UNUSUAL VAGINAL DISCHARGE OR ITCHING  ? ?Items with * indicate a potential emergency and should be followed up as soon as possible or go to the Emergency Department if any problems should occur. ? ?Please show the CHEMOTHERAPY ALERT CARD or IMMUNOTHERAPY ALERT CARD at check-in to the  Emergency Department and triage nurse. ? ?Should you have questions after your visit or need to cancel or reschedule your appointment, please contact [facility]  Dept: [phone]  and follow the prompts.  Office hours are 8:00 a.m. to 4:30 p.m. Monday - Friday. Please note that voicemails left after 4:00 p.m. may not be returned until the following business day.  We are closed weekends and major holidays. You have access to a nurse at all times for urgent questions. Please call the main number to the clinic Dept: [phone] and follow the prompts. ? ? ?For any non-urgent questions, you may also contact your provider using MyChart. We now offer e-Visits for anyone 18 and older to request care online for non-urgent symptoms. For details visit [email or web address]. ?  ?Also download the MyChart app! Go to the app store, search "MyChart", open the app, select , and log in with your MyChart username and password. ? ?Due to Covid, a mask is required upon entering the hospital/clinic. If you do not have a mask, one will be given to you upon arrival. For doctor visits, patients may have 1 support person aged 18 or older with them. For treatment visits, patients cannot have anyone with them due to current Covid guidelines and our immunocompromised population.  ? ?

## 2021-04-28 NOTE — Progress Notes (Addendum)
[facility]   [phone] [phone]   Clinic Follow up Note   Patient Care Team: [name], MD as PCP - General (Family Medicine) Jerline Pain, MD as PCP - Cardiology (Cardiology) Delia Chimes, NP (Inactive) as Nurse Practitioner (Nurse Practitioner) Ladene Artist, MD as Consulting Physician (Gastroenterology) Alla Feeling, NP as Nurse Practitioner (Nurse Practitioner) Truitt Merle, MD as Consulting Physician (Hematology) Truitt Merle, MD as Consulting Physician (Oncology)  Date of Service:  04/28/2021  CHIEF COMPLAINT: f/u of esophageal cancer  CURRENT THERAPY:  Nivolumab, q2weeks, starting 03/31/21  ASSESSMENT & PLAN:  Jeffrey [name] is a 81 y.o. male with   1. Adenocarcinoma of the GE junction, cTX N1 M0, HER2 negative, MMR normal, PD-L1 CPS 3% -He presented with 6-8 months of dysphagia and 10 pounds weight loss and a palpable right cervical lymph node. Initial CT neck 10/22/20 showed 2.8 cm LN, concerning for metastatic disease.  -CT 12/08/20 showed suspicious mass at GE junction and prominent gastrohepatic LNs. EGD 12/17/20 by Dr. Fuller Plan showed 5 cm distal esophageal mass, biopsy confirmed adenocarcinoma with signet ring features. HER 2 negative -PET scan 12/30/20 showed FDG-avid mass at the GE junction and mild uptake with borderline gastrohepatic ligament lymph nodes, no distant metastasis. -He began concurrent chemo RT with weekly carbo/Taxol 01/18/21, C1 carbo was dose reduced to AUC 1.5 for age and co-morbidities. He tolerated moderately well with fatigue, decreased appetite/taste change, nausea, and constipation. -cervical lymph node biopsy on 01/29/21 showed atypical cells.  -he completed chemo on 12/19 and radiation on 02/26/21. He had slow recovery -he began nivolumab on 03/31/21; he tolerated well, no noticeable side effects. Labs reviewed, overall stable, adequate to proceed with third dose today. -plan to repeat PET in late  March or early April  -we also discuss repeating EGD if he has persistent dysphagia (intermittent now)    2. Symptom Management: Weight loss, diarrhea, urinary frequency -Continue follow-up with dietitian -he reports he is going to the bathroom 8-10 times a day and 4-5 times at night. He notes he has to sit on the toilet due to some bowel incontinence. I advised him to talk to his PCP for management.   3. Goals of care -He understands if work-up confirms metastatic disease, it will not be curable but still treatable, treatment will be palliative   4. Comorbidities: CVA with residual right-sided weakness, history of MI, CAD, HTN, DM, HL -Patient lives alone, his daughter checks on him frequently to help with housework and food prep -He is limited in his ADLs due to residual right-sided weakness from CVA -He ambulates with support, can bathe and dress with difficulty     PLAN: -proceed with cycle 3 Nivo today -lab, f/u, and nivo every 2 weeks x3 -will order PET scan on next visit    No problem-specific Assessment & Plan notes found for this encounter.   SUMMARY OF ONCOLOGIC HISTORY: Oncology History Overview Note  Cancer Staging Esophageal cancer [facility]) Staging form: Esophagus - Adenocarcinoma, AJCC 8th Edition - Clinical stage from 12/31/2020: Stage IIA (cT1, cN1, cM0, GX) - Unsigned    Esophageal cancer ([facility])  10/22/2020 Imaging   CT neck IMPRESSION: 2.8 cm right level 2A lymph node, which appears to be centrally necrotic, concerning for metastatic disease. No additional lymphadenopathy or other acute process in the neck.     11/30/2020 Imaging   Barium swallow FINDINGS: Limitations on positioning due to sequelae of stroke. The patient swallowed barium without difficulty.  No aspiration. There is no mass or stricture. Normal motility. There is no hiatal hernia. No spontaneous gastroesophageal reflux. Barium pill was swallowed and passed without difficulty.    IMPRESSION: No significant abnormality.   12/08/2020 Imaging   CT chest abdomen with contrast IMPRESSION: 1. Mass is suspected just at the level of the GE junction. Suspicious for primary esophageal neoplasm. Further evaluation with endoscopy is advised. 2. Prominent perigastric lymph nodes are identified, suspicious for metastatic adenopathy. 3. Gallstone. 4. Coronary artery calcifications. 5. Aortic Atherosclerosis (ICD10-I70.0) and Emphysema (ICD10-J43.9). 6. These results will be called to the ordering clinician or representative by the Radiologist Assistant, and communication documented in the PACS or [employer].     12/17/2020 Procedure   EGD by Dr. Fuller Plan - A medium-sized, fungating mass with contact bleeding was found in the distal esophagus, 36 cm from the incisors extending to 41 cm in the cardia. The mass was partially obstructing and circumferential.   12/17/2020 Initial Biopsy   Diagnosis Esophagus, biopsy, distal and gastric cardia mass - ADENOCARCINOMA WITH SIGNET RING FEATURES.   12/19/2020 Initial Diagnosis   Esophageal cancer ([facility])   12/30/2020 PET scan   IMPRESSION: 1. Mass at the level of the GE junction and proximal stomach is FDG avid within SUV max of 7.62. Findings compatible with primary gastric neoplasm. 2. There is mild FDG uptake associated with borderline gastrohepatic ligament lymph nodes which have an SUV max of 2.97. Equivocal for nodal metastasis. 3. No signs of distant metastatic disease. 4. Aortic Atherosclerosis (ICD10-I70.0) and Emphysema (ICD10-J43.9). Coronary artery calcifications. 5. Gallstones. 6. Prostate gland enlargement.   01/08/2021 Imaging   EXAM: ULTRASOUND OF HEAD/NECK SOFT TISSUES  IMPRESSION: Sonographic evaluation of the right-side of the neck confirms a minimally complex cystic lesion with broad differential considerations including cystic/necrotic cervical lymphadenopathy (favored given presence of an additional  smaller apparently partially cystic right cervical lymph node), compatible with the findings on preceding PET-CT.   This minimally complex cystic structure/mass could undergo ultrasound-guided biopsy/aspiration as indicated.   01/18/2021 - 02/22/2021 Chemotherapy   Patient is on Treatment Plan : ESOPHAGUS Carboplatin/PACLitaxel weekly x 6 weeks with XRT       01/29/2021 Pathology Results   A. RIGHT, SUBMANDIBULAR CYST, FINE NEEDLE  ASPIRATION:   FINAL MICROSCOPIC DIAGNOSIS:  - Atypical cells present DIAGNOSTIC COMMENTS:  There are clusters of epithelioid cells with nuclear grooves and  pseudoinclusions admixed with pigmented histiocytes.  There is  insufficient material for ancillary studies.  Excisional biopsy may be  of interest.   03/31/2021 -  Chemotherapy   Patient is on Treatment Plan : HEAD/NECK Nivolumab q14d        INTERVAL HISTORY:  Jeffrey [name] is here for a follow up of esophageal cancer. He was last seen by me on 04/15/21. He presents to the clinic accompanied by his daughter. He reports he feels good. He notes sometimes he has trouble falling asleep. He also reports he is having diarrhea with some bowel incontinence and urinary frequency.   All other systems were reviewed with the patient and are negative.  MEDICAL HISTORY:  Past Medical History:  Diagnosis Date   Anterior cerebral circulation hemorrhagic infarction [facility]) 11/24/2014   March, 2016, dominant left thalamic and left internal capsule ischemic infarct with resultant hemorrhagic transformation, secondary to small vessel disease. Resulted in right hemiparesis dysarthria and diplopia   //   readmission with aphasia July, 2016, this improved    Arthritis    CAD (coronary artery disease)  PCI distal RCA ...2004, residual 70% LAD   /   ...nuclear...03/2007...no ischemia.Marland KitchenMarland Kitchenpreserved LV /  nuclear...03/03/2009...inferior scar..no ischemia..EF 51%   Cyst of nasopharynx    per ENT Wilburn Cornelia   Dyslipidemia     takes Atorvastatin daily   GERD (gastroesophageal reflux disease)    takes Omeprazole daily   Gout    takes Allopurinol daily and Colchicine as needed   HCAP (healthcare-associated pneumonia)    Hemiparesis affecting right side as late effect of cerebrovascular accident ([place]) 09/05/2014   History of colon polyps    HTN (hypertension)    takes Cardura,Metoprolol,[name],and Amlodipine daily   Internal hemorrhoids    Myocardial infarction [facility]) 46yr ago   Peripheral edema    takes Lasix daily   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    SURGICAL HISTORY: Past Surgical History:  Procedure Laterality Date   BALLOON ANGIOPLASTY, ARTERY  1992, 2004   CAD, Dr. KRon Parker  CARDIAC CATHETERIZATION  2004   CATARACT EXTRACTION Bilateral 2010   COLONOSCOPY  04/03/2002   adenomatous polyp, int hemorrhoids   COLONOSCOPY  07/18/2007   normal (Dr. SFuller Plan   COLONOSCOPY  09/26/2012   tubular adenoma, sm int hem, rpt 5 yrs (Fuller Plan   ELBOW SURGERY Right 1997   eye lids raised     NASAL SEPTUM SURGERY     POLYPECTOMY N/A 05/27/2013   Procedure: ENDOSCOPIC NASOPHARYNGEAL MASS;  Surgeon: DJerrell Belfast MD   ROTATOR CUFF REPAIR  09/2011   left, with subacromial decompression   TONSILLECTOMY AND ADENOIDECTOMY      I have reviewed the social history and family history with the patient and they are unchanged from previous note.  ALLERGIES:  is allergic to niacin.  MEDICATIONS:  Current Outpatient Medications  Medication Sig Dispense Refill   amoxicillin-clavulanate (AUGMENTIN) 500-125 MG tablet Take 1 tablet by mouth 2 (two) times daily.     atorvastatin (LIPITOR) 80 MG tablet Take 80 mg by mouth at bedtime.     baclofen (LIORESAL) 10 MG tablet Take 10 mg by mouth 3  (three) times daily.     cholecalciferol (VITAMIN D3) 25 MCG (1000 UNIT) tablet Take 1,000 Units by mouth daily.     clopidogrel (PLAVIX) 75 MG tablet Take 1 tablet (75 mg total) by mouth daily.     empagliflozin (JARDIANCE) 25 MG TABS tablet Take 25 mg by mouth daily.     fosinopril ([name]) 40 MG tablet Take 1 tablet (40 mg total) by mouth daily.     furosemide (LASIX) 20 MG tablet Take 20-40 mg by mouth See admin instructions. Take 20 mg on Mon, Wed, Fri, and Sun. Take 40 mg on Tues, Thurs, and Sat     insulin aspart (NOVOLOG FLEXPEN) 100 UNIT/ML FlexPen Inject 6 Units into the skin 2 (two) times daily with a meal. Breakfast and dinner     insulin glargine (LANTUS) 100 UNIT/ML Solostar Pen Inject 8 Units into the skin at bedtime.     LACTOBACILLUS PO Take 1 capsule by mouth every Monday, Wednesday, and Friday.     loratadine (CLARITIN) 10 MG tablet Take 10 mg by mouth.     megestrol (MEGACE ES) 625 MG/5ML suspension Take 5 mLs (625 mg total) by mouth daily. (  Patient not taking: Reported on 04/09/2021) 150 mL 2   metoprolol tartrate (LOPRESSOR) 25 MG tablet Take 1 tablet (25 mg total) by mouth 2 (two) times daily.     Olopatadine HCl (PATADAY OP) Place 1 drop into both eyes daily as needed (allergies).     pantoprazole (PROTONIX) 20 MG tablet TAKE 1 TABLET BY MOUTH EVERY DAY 90 tablet 0   Potassium 99 MG TABS Take 1 tablet (99 mg total) by mouth daily as needed (when you take 2 lasix pills).     sertraline (ZOLOFT) 25 MG tablet Take 1 tablet (25 mg total) by mouth daily.     silver sulfADIAZINE (SILVADENE) 1 % cream Apply 1 application topically daily. To sore on buttocks daily 50 g 0   No current facility-administered medications for this visit.    PHYSICAL EXAMINATION: ECOG PERFORMANCE STATUS: 3 - Symptomatic, >50% confined to bed  Vitals:   04/28/21 1223  BP: 129/72  Pulse: 65  Resp: 17  Temp: 98.7 F (37.1 C)  SpO2: 100%   Wt Readings from Last 3 Encounters:  04/28/21 139 lb  11.2 oz (63.4 kg)  04/15/21 138 lb 9.6 oz (62.9 kg)  04/09/21 140 lb (63.5 kg)     GENERAL:alert, no distress and comfortable SKIN: skin color normal, no rashes or significant lesions EYES: normal, Conjunctiva are pink and non-injected, sclera clear  NEURO: alert & oriented x 3 with fluent speech  LABORATORY DATA:  I have reviewed the data as listed CBC Latest Ref Rng & Units 04/28/2021 04/15/2021 03/22/2021  WBC 4.0 - 10.5 K/uL 7.1 5.8 4.9  Hemoglobin 13.0 - 17.0 g/dL 11.5(L) 11.6(L) 12.2(L)  Hematocrit 39.0 - 52.0 % 34.0(L) 33.8(L) 35.6(L)  Platelets 150 - 400 K/uL 163 186 177.0     CMP Latest Ref Rng & Units 04/28/2021 04/15/2021 03/31/2021  Glucose 70 - 99 mg/dL 265(H) 178(H) 228(H)  BUN 8 - 23 mg/dL 29(H) 16 18  Creatinine 0.61 - 1.24 mg/dL 0.88 0.65 0.73  Sodium 135 - 145 mmol/L 139 138 136  Potassium 3.5 - 5.1 mmol/L 3.9 3.4(L) 3.7  Chloride 98 - 111 mmol/L 109 107 108  CO2 22 - 32 mmol/L 23 25 19(L)  Calcium 8.9 - 10.3 mg/dL 8.9 8.7(L) 8.4(L)  Total Protein 6.5 - 8.1 g/dL 5.8(L) 6.1(L) 5.4(L)  Total Bilirubin 0.3 - 1.2 mg/dL 0.5 0.8 0.6  Alkaline Phos 38 - 126 U/L 77 110 75  AST 15 - 41 U/L 11(L) 21 21  ALT 0 - 44 U/L _0 RADIOGRAPHIC STUDIES: I have personally reviewed the radiological images as listed and agreed with the findings in the report. No results found.    No orders of the defined types were placed in this encounter.  All questions were answered. The patient knows to call the clinic with any problems, questions or concerns. No barriers to learning was detected. The total time spent in the appointment was 30 minutes.     Truitt Merle, MD 04/28/2021   I, Wilburn Mylar, am acting as scribe for Truitt Merle, MD.   I have reviewed the above documentation for accuracy and completeness, and I agree with the above.

## 2021-04-29 LAB — T4: T4, Total: 5.1 ug/dL (ref 4.5–12.0)

## 2021-05-04 DIAGNOSIS — H52203 Unspecified astigmatism, bilateral: Secondary | ICD-10-CM | POA: Diagnosis not present

## 2021-05-04 DIAGNOSIS — H532 Diplopia: Secondary | ICD-10-CM | POA: Diagnosis not present

## 2021-05-04 DIAGNOSIS — E113293 Type 2 diabetes mellitus with mild nonproliferative diabetic retinopathy without macular edema, bilateral: Secondary | ICD-10-CM | POA: Diagnosis not present

## 2021-05-04 DIAGNOSIS — Z961 Presence of intraocular lens: Secondary | ICD-10-CM | POA: Diagnosis not present

## 2021-05-08 ENCOUNTER — Other Ambulatory Visit: Payer: Self-pay | Admitting: Nurse Practitioner

## 2021-05-10 ENCOUNTER — Telehealth: Payer: Self-pay

## 2021-05-10 NOTE — Telephone Encounter (Signed)
Pt's daughter called stating that her dad tested (+) for Covid on 05/09/2021.  Pt's daughter requested to cancel her dad's appts on 05/12/2021.  Pt's daughter stated that her dad is displaying no symptoms and is breathing WNL.  Instructed pt to continue to monitor the pt for changes in breathing and temperature and to notify Dr. Ernestina Penna office or report to the nearest emergency department with severe respiratory distress.  Pt's daughter verbalized understanding and had no further questions or concerns.  Notified Dr. Burr Medico of pt's status. ?

## 2021-05-12 ENCOUNTER — Other Ambulatory Visit: Payer: PPO

## 2021-05-12 ENCOUNTER — Ambulatory Visit: Payer: PPO

## 2021-05-12 ENCOUNTER — Ambulatory Visit: Payer: PPO | Admitting: Hematology

## 2021-05-20 ENCOUNTER — Other Ambulatory Visit: Payer: Self-pay | Admitting: Family Medicine

## 2021-05-20 ENCOUNTER — Other Ambulatory Visit: Payer: Self-pay

## 2021-05-20 ENCOUNTER — Other Ambulatory Visit (INDEPENDENT_AMBULATORY_CARE_PROVIDER_SITE_OTHER): Payer: PPO

## 2021-05-20 DIAGNOSIS — E785 Hyperlipidemia, unspecified: Secondary | ICD-10-CM | POA: Diagnosis not present

## 2021-05-20 DIAGNOSIS — E1169 Type 2 diabetes mellitus with other specified complication: Secondary | ICD-10-CM | POA: Diagnosis not present

## 2021-05-20 DIAGNOSIS — M1A9XX0 Chronic gout, unspecified, without tophus (tophi): Secondary | ICD-10-CM | POA: Diagnosis not present

## 2021-05-20 DIAGNOSIS — E1140 Type 2 diabetes mellitus with diabetic neuropathy, unspecified: Secondary | ICD-10-CM | POA: Diagnosis not present

## 2021-05-20 LAB — LIPID PANEL
Cholesterol: 127 mg/dL (ref 0–200)
HDL: 35.8 mg/dL — ABNORMAL LOW (ref >39.00)
LDL Cholesterol: 64 mg/dL (ref 0–99)
NonHDL: 90.77
Total CHOL/HDL Ratio: 4
Triglycerides: 133 mg/dL (ref 0.0–149.0)
VLDL: 26.6 mg/dL (ref 0.0–40.0)

## 2021-05-20 LAB — COMPREHENSIVE METABOLIC PANEL
ALT: 40 U/L (ref 0–53)
AST: 17 U/L (ref 0–37)
Albumin: 3.4 g/dL — ABNORMAL LOW (ref 3.5–5.2)
Alkaline Phosphatase: 112 U/L (ref 39–117)
BUN: 22 mg/dL (ref 6–23)
CO2: 28 mEq/L (ref 19–32)
Calcium: 9.2 mg/dL (ref 8.4–10.5)
Chloride: 102 mEq/L (ref 96–112)
Creatinine, Ser: 0.96 mg/dL (ref 0.40–1.50)
GFR: 75.8 mL/min (ref >60.00)
Glucose, Bld: 268 mg/dL — ABNORMAL HIGH (ref 70–99)
Potassium: 3.7 mEq/L (ref 3.5–5.1)
Sodium: 137 mEq/L (ref 135–145)
Total Bilirubin: 0.6 mg/dL (ref 0.2–1.2)
Total Protein: 6.1 g/dL (ref 6.0–8.3)

## 2021-05-20 LAB — HEMOGLOBIN A1C: Hgb A1c MFr Bld: 7.9 % — ABNORMAL HIGH (ref 4.6–6.5)

## 2021-05-20 LAB — URIC ACID: Uric Acid, Serum: 4.4 mg/dL (ref 4.0–7.8)

## 2021-05-21 ENCOUNTER — Ambulatory Visit: Payer: PPO | Admitting: Podiatry

## 2021-05-21 ENCOUNTER — Encounter: Payer: Self-pay | Admitting: Podiatry

## 2021-05-21 DIAGNOSIS — E1142 Type 2 diabetes mellitus with diabetic polyneuropathy: Secondary | ICD-10-CM | POA: Diagnosis not present

## 2021-05-21 DIAGNOSIS — M79674 Pain in right toe(s): Secondary | ICD-10-CM | POA: Diagnosis not present

## 2021-05-21 DIAGNOSIS — E1165 Type 2 diabetes mellitus with hyperglycemia: Secondary | ICD-10-CM | POA: Insufficient documentation

## 2021-05-21 DIAGNOSIS — M79675 Pain in left toe(s): Secondary | ICD-10-CM

## 2021-05-21 DIAGNOSIS — B351 Tinea unguium: Secondary | ICD-10-CM

## 2021-05-23 DIAGNOSIS — Z20822 Contact with and (suspected) exposure to covid-19: Secondary | ICD-10-CM | POA: Diagnosis not present

## 2021-05-25 ENCOUNTER — Other Ambulatory Visit: Payer: Self-pay

## 2021-05-25 ENCOUNTER — Telehealth: Payer: Self-pay

## 2021-05-25 DIAGNOSIS — C158 Malignant neoplasm of overlapping sites of esophagus: Secondary | ICD-10-CM

## 2021-05-25 MED ORDER — MEGESTROL ACETATE 625 MG/5ML PO SUSP: 625.0000 mg | Freq: Every day | ORAL | 1 refills | Status: DC

## 2021-05-25 NOTE — Telephone Encounter (Signed)
This nurse received a call from patients daughter and wanted to know if her father could still come from his infusion on 05/27/21 due to testing positive for Covid on 3/5.  Patient has been asymptomatic.  This nurse spoke with charge nurse and scheduling and was advised that patient can have infusion in a bed on that day.  This nurse explained to patients daughter that patient is to come to the center and remain in the car.  Give [place] a call on arrival and a gowned up nurse will come with a wheelchair and escort the patient directly to his room.  Daughter acknowledged understanding.  No further questions and concerns at this time.    ?

## 2021-05-27 ENCOUNTER — Inpatient Hospital Stay: Payer: PPO

## 2021-05-27 ENCOUNTER — Other Ambulatory Visit: Payer: Self-pay

## 2021-05-27 ENCOUNTER — Inpatient Hospital Stay: Payer: PPO | Attending: Nurse Practitioner

## 2021-05-27 ENCOUNTER — Inpatient Hospital Stay (HOSPITAL_BASED_OUTPATIENT_CLINIC_OR_DEPARTMENT_OTHER): Payer: PPO | Admitting: Hematology

## 2021-05-27 VITALS — BP 123/69 | HR 71 | Temp 98.5°F | Resp 18

## 2021-05-27 DIAGNOSIS — Z5112 Encounter for antineoplastic immunotherapy: Secondary | ICD-10-CM | POA: Diagnosis not present

## 2021-05-27 DIAGNOSIS — C160 Malignant neoplasm of cardia: Secondary | ICD-10-CM | POA: Diagnosis not present

## 2021-05-27 DIAGNOSIS — C158 Malignant neoplasm of overlapping sites of esophagus: Secondary | ICD-10-CM | POA: Diagnosis not present

## 2021-05-27 DIAGNOSIS — Z79899 Other long term (current) drug therapy: Secondary | ICD-10-CM | POA: Insufficient documentation

## 2021-05-27 DIAGNOSIS — C155 Malignant neoplasm of lower third of esophagus: Secondary | ICD-10-CM

## 2021-05-27 LAB — CBC WITH DIFFERENTIAL (CANCER CENTER ONLY)
Abs Immature Granulocytes: 0.03 10*3/uL (ref 0.00–0.07)
Basophils Absolute: 0 10*3/uL (ref 0.0–0.1)
Basophils Relative: 0 %
Eosinophils Absolute: 0.1 10*3/uL (ref 0.0–0.5)
Eosinophils Relative: 1 %
HCT: 34.7 % — ABNORMAL LOW (ref 39.0–52.0)
Hemoglobin: 11.6 g/dL — ABNORMAL LOW (ref 13.0–17.0)
Immature Granulocytes: 0 %
Lymphocytes Relative: 10 %
Lymphs Abs: 0.8 10*3/uL (ref 0.7–4.0)
MCH: 31.4 pg (ref 26.0–34.0)
MCHC: 33.4 g/dL (ref 30.0–36.0)
MCV: 94 fL (ref 80.0–100.0)
Monocytes Absolute: 0.7 10*3/uL (ref 0.1–1.0)
Monocytes Relative: 8 %
Neutro Abs: 6.3 10*3/uL (ref 1.7–7.7)
Neutrophils Relative %: 81 %
Platelet Count: 189 10*3/uL (ref 150–400)
RBC: 3.69 MIL/uL — ABNORMAL LOW (ref 4.22–5.81)
RDW: 13 % (ref 11.5–15.5)
WBC Count: 7.8 10*3/uL (ref 4.0–10.5)
nRBC: 0 % (ref 0.0–0.2)

## 2021-05-27 LAB — CMP (CANCER CENTER ONLY)
ALT: 23 U/L (ref 0–44)
AST: 14 U/L — ABNORMAL LOW (ref 15–41)
Albumin: 3.3 g/dL — ABNORMAL LOW (ref 3.5–5.0)
Alkaline Phosphatase: 102 U/L (ref 38–126)
Anion gap: 7 (ref 5–15)
BUN: 24 mg/dL — ABNORMAL HIGH (ref 8–23)
CO2: 24 mmol/L (ref 22–32)
Calcium: 8.8 mg/dL — ABNORMAL LOW (ref 8.9–10.3)
Chloride: 107 mmol/L (ref 98–111)
Creatinine: 0.94 mg/dL (ref 0.61–1.24)
GFR, Estimated: >60 mL/min (ref >60)
Glucose, Bld: 279 mg/dL — ABNORMAL HIGH (ref 70–99)
Potassium: 3.7 mmol/L (ref 3.5–5.1)
Sodium: 138 mmol/L (ref 135–145)
Total Bilirubin: 0.4 mg/dL (ref 0.3–1.2)
Total Protein: 6.1 g/dL — ABNORMAL LOW (ref 6.5–8.1)

## 2021-05-27 LAB — CEA (IN HOUSE-CHCC): CEA (CHCC-In House): 4.03 ng/mL (ref 0.00–5.00)

## 2021-05-27 LAB — TSH: TSH: 0.58 u[IU]/mL (ref 0.320–4.118)

## 2021-05-27 MED ORDER — SODIUM CHLORIDE 0.9 % IV SOLN: Freq: Once | INTRAVENOUS | Status: AC

## 2021-05-27 MED ORDER — SODIUM CHLORIDE 0.9 % IV SOLN
240.0000 mg | Freq: Once | INTRAVENOUS | Status: AC
  Administered 2021-05-27: 240 mg via INTRAVENOUS
  Filled 2021-05-27: qty 24

## 2021-05-27 NOTE — Progress Notes (Addendum)
?[facility]   ?[phone]) [phone] [phone]   ?Clinic Follow up Note  ? ?Patient Care Team: ?[name], MD as PCP - General (Family Medicine) ?Jerline Pain, MD as PCP - Cardiology (Cardiology) ?Delia Chimes, NP (Inactive) as Nurse Practitioner (Nurse Practitioner) ?Ladene Artist, MD as Consulting Physician (Gastroenterology) ?Alla Feeling, NP as Nurse Practitioner (Nurse Practitioner) ?Truitt Merle, MD as Consulting Physician (Hematology) ?Truitt Merle, MD as Consulting Physician (Oncology) ? ?Date of Service:  05/27/2021 ? ?CHIEF COMPLAINT: f/u of esophageal cancer ? ?CURRENT THERAPY:  ?Nivolumab, q2weeks, starting 03/31/21 ? ?ASSESSMENT & PLAN:  ?Jeffrey [name] is a 81 y.o. male with  ? ?1. Adenocarcinoma of the GE junction, cTX N1 M0, HER2 negative, MMR normal, PD-L1 CPS 3% ?-He presented with 6-8 months of dysphagia and 10 pounds weight loss and a palpable right cervical lymph node. Initial CT neck 10/22/20 showed 2.8 cm LN, concerning for metastatic disease.  ?-CT 12/08/20 showed suspicious mass at GE junction and prominent gastrohepatic LNs. EGD 12/17/20 by Dr. Fuller Plan showed 5 cm distal esophageal mass, biopsy confirmed adenocarcinoma with signet ring features. HER 2 negative ?-PET scan 12/30/20 showed FDG-avid mass at the GE junction and mild uptake with borderline gastrohepatic ligament lymph nodes, no distant metastasis. ?-He began concurrent chemo RT with weekly carbo/Taxol 01/18/21, C1 carbo was dose reduced to AUC 1.5 for age and co-morbidities. He tolerated moderately well with fatigue, decreased appetite/taste change, nausea, and constipation. ?-cervical lymph node biopsy on 01/29/21 showed atypical cells.  ?-he completed chemo on 12/19 and radiation on 02/26/21. He had slow recovery ?-he began nivolumab on 03/31/21; he tolerated well, no noticeable side effects. Labs reviewed, overall stable, adequate to proceed with fourth dose today. We will plan to increase to  every 4 weeks after next cycle. ?-plan to repeat PET in late April; I ordered today. ?  ?2. Symptom Management: Weight loss, diarrhea, urinary frequency ?-Continue follow-up with dietitian ?-he reports he is going to the bathroom 8-10 times a day and 4-5 times at night. He notes he has to sit on the toilet due to some bowel incontinence. I advised him to talk to his PCP for management. ?  ?3. Goals of care ?-He understands if work-up confirms metastatic disease, it will not be curable but still treatable, treatment will be palliative ?  ?4. Comorbidities: CVA with residual right-sided weakness, history of MI, CAD, HTN, DM, HL ?-Patient lives alone, his daughter checks on him frequently to help with housework and food prep ?-He is limited in his ADLs due to residual right-sided weakness from CVA ?-He ambulates with support, can bathe and dress with difficulty ?  ?  ?PLAN: ?-proceed with cycle 4 Nivo today ?-lab, f/u, and nivo in 2 and 6 weeks ?-PET scan to be done in 4-5 weeks  ? ? ?No problem-specific Assessment & Plan notes found for this encounter. ? ? ?SUMMARY OF ONCOLOGIC HISTORY: ?Oncology History Overview Note  ?Cancer Staging ?Esophageal cancer ([place]) ?Staging form: Esophagus - Adenocarcinoma, AJCC 8th Edition ?- Clinical stage from 12/31/2020: Stage IIA (cT1, cN1, cM0, GX) - Unsigned ? ?  ?Esophageal cancer ([place])  ?10/22/2020 Imaging  ? CT neck IMPRESSION: ?2.8 cm right level 2A lymph node, which appears to be centrally ?necrotic, concerning for metastatic disease. No additional ?lymphadenopathy or other acute process in the neck. ?  ?  ?11/30/2020 Imaging  ? Barium swallow FINDINGS: ?Limitations on positioning due to sequelae of stroke. The patient ?swallowed barium without difficulty.  No aspiration. There is no mass ?or stricture. Normal motility. There is no hiatal hernia. No ?spontaneous gastroesophageal reflux. Barium pill was swallowed and ?passed without difficulty. ?  ?IMPRESSION: ?No significant  abnormality. ?  ?12/08/2020 Imaging  ? CT chest abdomen with contrast IMPRESSION: ?1. Mass is suspected just at the level of the GE junction. Suspicious for primary esophageal neoplasm. Further evaluation with endoscopy is advised. ?2. Prominent perigastric lymph nodes are identified, suspicious for metastatic adenopathy. ?3. Gallstone. ?4. Coronary artery calcifications. ?5. Aortic Atherosclerosis (ICD10-I70.0) and Emphysema (ICD10-J43.9). ?6. These results will be called to the ordering clinician or ?representative by the Radiologist Assistant, and communication ?documented in the PACS or [employer]. ?  ?  ?12/17/2020 Procedure  ? EGD by Dr. Fuller Plan ?- A medium-sized, fungating mass with contact bleeding was found in the distal esophagus, ?36 cm from the incisors extending to 41 cm in the cardia. The mass was partially obstructing ?and circumferential. ?  ?12/17/2020 Initial Biopsy  ? Diagnosis ?Esophagus, biopsy, distal and gastric cardia mass ?- ADENOCARCINOMA WITH SIGNET RING FEATURES. ?  ?12/19/2020 Initial Diagnosis  ? Esophageal cancer ([place]) ?  ?12/30/2020 PET scan  ? IMPRESSION: ?1. Mass at the level of the GE junction and proximal stomach is FDG avid within SUV max of 7.62. Findings compatible with primary gastric neoplasm. ?2. There is mild FDG uptake associated with borderline gastrohepatic ligament lymph nodes which have an SUV max of 2.97. Equivocal for nodal metastasis. ?3. No signs of distant metastatic disease. ?4. Aortic Atherosclerosis (ICD10-I70.0) and Emphysema (ICD10-J43.9). Coronary artery calcifications. ?5. Gallstones. ?6. Prostate gland enlargement. ?  ?01/08/2021 Imaging  ? EXAM: ?ULTRASOUND OF HEAD/NECK SOFT TISSUES ? ?IMPRESSION: ?Sonographic evaluation of the right-side of the neck confirms a ?minimally complex cystic lesion with broad differential ?considerations including cystic/necrotic cervical lymphadenopathy ?(favored given presence of an additional smaller apparently ?partially  cystic right cervical lymph node), compatible with the ?findings on preceding PET-CT. ?  ?This minimally complex cystic structure/mass could undergo ?ultrasound-guided biopsy/aspiration as indicated. ?  ?01/18/2021 - 02/22/2021 Chemotherapy  ? Patient is on Treatment Plan : ESOPHAGUS Carboplatin/PACLitaxel weekly x 6 weeks with XRT    ?   ?01/29/2021 Pathology Results  ? A. RIGHT, SUBMANDIBULAR CYST, FINE NEEDLE  ?ASPIRATION:  ? ?FINAL MICROSCOPIC DIAGNOSIS:  ?- Atypical cells present ?DIAGNOSTIC COMMENTS:  ?There are clusters of epithelioid cells with nuclear grooves and  ?pseudoinclusions admixed with pigmented histiocytes.  There is  ?insufficient material for ancillary studies.  Excisional biopsy may be  ?of interest. ?  ?03/31/2021 -  Chemotherapy  ? Patient is on Treatment Plan : HEAD/NECK Nivolumab q14d  ?   ? ? ? ?INTERVAL HISTORY:  ?TREVER STREATER is here for a follow up of esophageal cancer. He was last seen by me on 04/28/21. He was seen in the infusion area. ?He tested positive for Covid on 05/09/21 but he was asymptomatic.  He tested due to his daughter was diagnosed with COVID. He is clinically stable, no significant noticeable side effect from nivolumab infusion, no dysphagia, he is eating well.  No other new complaints.  Mild fatigue is stable. ?  ?All other systems were reviewed with the patient and are negative. ? ?MEDICAL HISTORY:  ?Past Medical History:  ?Diagnosis Date  ? Anterior cerebral circulation hemorrhagic infarction ([place]) 11/24/2014  ? March, 2016, dominant left thalamic and left internal capsule ischemic infarct with resultant hemorrhagic transformation, secondary to small vessel disease. Resulted in right hemiparesis dysarthria and diplopia   //  readmission with aphasia July, 2016, this improved   ? Arthritis   ? CAD (coronary artery disease)   ? PCI distal RCA ...2004, residual 70% LAD   /   ...nuclear...03/2007...no ischemia.Marland KitchenMarland Kitchenpreserved LV /  nuclear...03/03/2009...inferior scar..no  ischemia..EF 51%  ? Cyst of nasopharynx   ? per ENT Wilburn Cornelia  ? Dyslipidemia   ? takes Atorvastatin daily  ? GERD (gastroesophageal reflux disease)   ? takes Omeprazole daily  ? Gout   ? takes Allopurinol dai

## 2021-05-27 NOTE — Patient Instructions (Signed)
[facility]COL[facility]  Discharge Instructions: ?Thank you for choosing [facility] to provide your oncology and hematology care.  ? ?If you have a lab appointment with the Cancer Center, please go directly to the Cancer Center and check in at the registration area. ?  ?Wear comfortable clothing and clothing appropriate for easy access to any Portacath or PICC line.  ? ?We strive to give you quality time with your provider. You may need to reschedule your appointment if you arrive late (15 or more minutes).  Arriving late affects you and other patients whose appointments are after yours.  Also, if you miss three or more appointments without notifying the office, you may be dismissed from the clinic at the provider?s discretion.    ?  ?For prescription refill requests, have your pharmacy contact our office and allow 72 hours for refills to be completed.   ? ?Today you received the following chemotherapy and/or immunotherapy agents Opdivo    ?  ?To help prevent nausea and vomiting after your treatment, we encourage you to take your nausea medication as directed. ? ?BELOW ARE SYMPTOMS THAT SHOULD BE REPORTED IMMEDIATELY: ?*FEVER GREATER THAN 100.4 F (38 ?C) OR HIGHER ?*CHILLS OR SWEATING ?*NAUSEA AND VOMITING THAT IS NOT CONTROLLED WITH YOUR NAUSEA MEDICATION ?*UNUSUAL SHORTNESS OF BREATH ?*UNUSUAL BRUISING OR BLEEDING ?*URINARY PROBLEMS (pain or burning when urinating, or frequent urination) ?*BOWEL PROBLEMS (unusual diarrhea, constipation, pain near the anus) ?TENDERNESS IN MOUTH AND THROAT WITH OR WITHOUT PRESENCE OF ULCERS (sore throat, sores in mouth, or a toothache) ?UNUSUAL RASH, SWELLING OR PAIN  ?UNUSUAL VAGINAL DISCHARGE OR ITCHING  ? ?Items with * indicate a potential emergency and should be followed up as soon as possible or go to the Emergency Department if any problems should occur. ? ?Please show the CHEMOTHERAPY ALERT CARD or IMMUNOTHERAPY ALERT CARD at check-in to the  Emergency Department and triage nurse. ? ?Should you have questions after your visit or need to cancel or reschedule your appointment, please contact [facility]  Dept: [phone]  and follow the prompts.  Office hours are 8:00 a.m. to 4:30 p.m. Monday - Friday. Please note that voicemails left after 4:00 p.m. may not be returned until the following business day.  We are closed weekends and major holidays. You have access to a nurse at all times for urgent questions. Please call the main number to the clinic Dept: [phone] and follow the prompts. ? ? ?For any non-urgent questions, you may also contact your provider using MyChart. We now offer e-Visits for anyone 18 and older to request care online for non-urgent symptoms. For details visit [email or web address]. ?  ?Also download the MyChart app! Go to the app store, search "MyChart", open the app, select [facility], and log in with your MyChart username and password. ? ?Due to Covid, a mask is required upon entering the hospital/clinic. If you do not have a mask, one will be given to you upon arrival. For doctor visits, patients may have 1 support person aged 18 or older with them. For treatment visits, patients cannot have anyone with them due to current Covid guidelines and our immunocompromised population.  ? ?

## 2021-05-28 ENCOUNTER — Encounter: Payer: Self-pay | Admitting: Family Medicine

## 2021-05-28 ENCOUNTER — Ambulatory Visit (INDEPENDENT_AMBULATORY_CARE_PROVIDER_SITE_OTHER): Payer: PPO | Admitting: Family Medicine

## 2021-05-28 VITALS — BP 140/60 | HR 79 | Temp 98.0°F | Ht 68.0 in | Wt 145.5 lb

## 2021-05-28 DIAGNOSIS — I2510 Atherosclerotic heart disease of native coronary artery without angina pectoris: Secondary | ICD-10-CM

## 2021-05-28 DIAGNOSIS — E1169 Type 2 diabetes mellitus with other specified complication: Secondary | ICD-10-CM

## 2021-05-28 DIAGNOSIS — D369 Benign neoplasm, unspecified site: Secondary | ICD-10-CM

## 2021-05-28 DIAGNOSIS — E1149 Type 2 diabetes mellitus with other diabetic neurological complication: Secondary | ICD-10-CM | POA: Diagnosis not present

## 2021-05-28 DIAGNOSIS — I69351 Hemiplegia and hemiparesis following cerebral infarction affecting right dominant side: Secondary | ICD-10-CM | POA: Diagnosis not present

## 2021-05-28 DIAGNOSIS — R296 Repeated falls: Secondary | ICD-10-CM

## 2021-05-28 DIAGNOSIS — Z0001 Encounter for general adult medical examination with abnormal findings: Secondary | ICD-10-CM

## 2021-05-28 DIAGNOSIS — C158 Malignant neoplasm of overlapping sites of esophagus: Secondary | ICD-10-CM | POA: Diagnosis not present

## 2021-05-28 DIAGNOSIS — F0631 Mood disorder due to known physiological condition with depressive features: Secondary | ICD-10-CM

## 2021-05-28 DIAGNOSIS — I5032 Chronic diastolic (congestive) heart failure: Secondary | ICD-10-CM | POA: Diagnosis not present

## 2021-05-28 DIAGNOSIS — R3915 Urgency of urination: Secondary | ICD-10-CM

## 2021-05-28 DIAGNOSIS — Z7409 Other reduced mobility: Secondary | ICD-10-CM

## 2021-05-28 DIAGNOSIS — I69398 Other sequelae of cerebral infarction: Secondary | ICD-10-CM | POA: Diagnosis not present

## 2021-05-28 DIAGNOSIS — E785 Hyperlipidemia, unspecified: Secondary | ICD-10-CM

## 2021-05-28 DIAGNOSIS — M1A9XX0 Chronic gout, unspecified, without tophus (tophi): Secondary | ICD-10-CM

## 2021-05-28 DIAGNOSIS — E1159 Type 2 diabetes mellitus with other circulatory complications: Secondary | ICD-10-CM

## 2021-05-28 DIAGNOSIS — S81801A Unspecified open wound, right lower leg, initial encounter: Secondary | ICD-10-CM

## 2021-05-28 DIAGNOSIS — L89151 Pressure ulcer of sacral region, stage 1: Secondary | ICD-10-CM

## 2021-05-28 DIAGNOSIS — I152 Hypertension secondary to endocrine disorders: Secondary | ICD-10-CM

## 2021-05-28 DIAGNOSIS — Z7189 Other specified counseling: Secondary | ICD-10-CM

## 2021-05-28 LAB — POC URINALSYSI DIPSTICK (AUTOMATED)
Bilirubin, UA: NEGATIVE
Blood, UA: NEGATIVE
Glucose, UA: POSITIVE — AB
Ketones, UA: NEGATIVE
Leukocytes, UA: NEGATIVE
Nitrite, UA: NEGATIVE
Protein, UA: NEGATIVE
Spec Grav, UA: 1.015 (ref 1.010–1.025)
Urobilinogen, UA: 0.2 E.U./dL
pH, UA: 6 (ref 5.0–8.0)

## 2021-05-28 LAB — T4: T4, Total: 5.6 ug/dL (ref 4.5–12.0)

## 2021-05-28 MED ORDER — MIRABEGRON ER 25 MG PO TB24: 25.0000 mg | ORAL_TABLET | Freq: Every day | ORAL | 3 refills | Status: AC

## 2021-05-28 NOTE — Progress Notes (Signed)
? ? Patient ID: Jeffrey [name], male    DOB: [birth year], 81 y.o.   MRN: [id number] ? ?This visit was conducted in person. ? ?BP 140/60   Pulse 79   Temp 98 ?F (36.7 ?C) (Temporal)   Ht 5' 8" (1.727 m)   Wt 145 lb 8 oz (66 kg)   SpO2 96%   BMI 22.12 kg/m?   ? ?CC: CPE/AMW ?Subjective:  ? ?HPI: ?Jeffrey [name] is a 81 y.o. male presenting on 05/28/2021 for Annual Exam [facility] prt 2. Pt accompanied by daughter, Jeffrey [name]. ) ? ? ?Saw health advisor last month for medicare wellness visit. Note reviewed.   ? ?No results found.  ?Flowsheet Row Clinical Support from 04/09/2021 in [facility] at [facility]  ?PHQ-2 Total Score 0  ? ?  ?  ? ?  04/09/2021  ?  1:27 PM 04/08/2020  ?  1:09 PM 01/30/2019  ? 11:24 AM 01/15/2019  ?  1:59 PM 10/31/2017  ? 12:23 PM  ?Fall Risk   ?Falls in the past year? _0 0 No  ?Comment   Emmi Telephone Survey: data to providers prior to load    ?Number falls in past yr: 0 _1 ?Comment   Emmi Telephone Survey Actual Response = 2    ?Injury with Fall? 0 0 0 0   ?Risk for fall due to : Impaired mobility History of fall(s);Medication side effect;Impaired balance/gait     ?Follow up Falls prevention discussed Falls evaluation completed;Falls prevention discussed     ?Has had a few falls recently. Latest one was earlier this month - unable to get up for 15 hours. Now always carries his phone to prevent this from happening again.  ?Wife passed away March 15, 2021.  ? ?Hemorrhagic L thalamus stroke 05/2014 with residual R hemiparesis, dysmetria of RUE and RLE, diplopia. Right handed. Saw [facility] neuro-ophth for persistent diplopia with R homonymous hemianopsia s/p CVA.  ?  ?Endocrinology - DM sees Dr Buddy Duty regularly. Last saw last week.  ?CAD followed by cardiology Marlou Porch).  ?Takes lasix 2m daily, 453mon T/Th. [facility]  ? ?Worked with DMV to resume driving [id number] ? ?Late last year 2022 diagnosed with HER2 neg metastatic esophageal cancer ([place]). Treated with concurrent chemo and XRT  for 1+ month (carbo/taxol), currently on nivolumab maintenance immunotherapy. Planning upcoming PET to monitor disease. Sees oncology regularly (Dr FeBurr Medico He is swallowing better, appetite has improved, gaining some weight. He's been taking ensure complete.  ? ?Seen 02/2021 with sacral pressure ulcer - sent to ER at that time due to sudden dyspnea/tachycardia - acute kidney injury treated with IVF with benefit, CTA chest negative for PE. Daughter is monitoring this sore - it is healing well.  ?Seen 03/2021 after fall with RLE hematoma.  ? ?Recent COVID illness - however he was largely asymptomatic. Daughter also tested positive with more symptoms.  ? ?Preventative: ?COLONOSCOPY Date: 09/26/2012 tubular adenoma, sm int hem, rpt 5 yrs ([place] Plan Will age out at this time  ?Prostate cancer screening - no fmhx prostate cancer. None recent, no problems in the past. nocturia x4-6.  ?Lung cancer screening - not candidate - remote smoker ?Flu shot - yearly ?[place]/2021, 05/2019, booster 04/2020, bivalent 01/2021 ?Prevnar-13 2015, pneumovax 2014 ?Zoster - 2013.  ?Tdap 2007, Td 2017 ?shingrix - 10/2017, 04/2018  ?Advanced directive discussion - HCPOA would be daughter ChAlyse LowHas advanced directive at home. Asked to bring [place]  copy. DNR rescinded. ?Seat belt use discussed.  ?Sunscreen use and skin screen discussed, no changing moles on skin.  ?Ex smoker, quit remotely ?Alcohol - none  ?Dentist Q6 mo  ?Eye exam - yearly  ?Bowel - notes some difficulty controlling bowels with intermittent accidents, 1-2 x/wk.  ?Bladder - urinary urge incontinence with accidents a few times a week. No stress incontinence symptoms.  ?  ?Caffeine: 6-7 cups coffee  ?Lives with wife, no pets 2 grown children, 4 grandchildren  ?Occu: retired [employer] ?Edu: [employer]  ?Activity: not much  ?Diet: plenty of water, fruits and vegetables  ?Some care through [place] ?   ? ?Relevant past medical, surgical, family and social history  reviewed and updated as indicated. Interim medical history since our last visit reviewed. ?Allergies and medications reviewed and updated. ?Outpatient Medications Prior to Visit  ?Medication Sig Dispense Refill  ? atorvastatin (LIPITOR) 80 MG tablet Take 80 mg by mouth at bedtime.    ? baclofen (LIORESAL) 10 MG tablet Take 10 mg by mouth 3 (three) times daily.    ? cholecalciferol (VITAMIN D3) 25 MCG (1000 UNIT) tablet Take 1,000 Units by mouth daily.    ? clopidogrel (PLAVIX) 75 MG tablet Take 1 tablet (75 mg total) by mouth daily.    ? empagliflozin (JARDIANCE) 25 MG TABS tablet Take 25 mg by mouth daily.    ? fosinopril ([name]) 40 MG tablet Take 1 tablet (40 mg total) by mouth daily.    ? furosemide (LASIX) 20 MG tablet Take 20-40 mg by mouth See admin instructions. Take 20 mg on Mon, Wed, Fri, and Sun. Take 40 mg on Tues, Thurs, and Sat    ? insulin aspart (NOVOLOG FLEXPEN) 100 UNIT/ML FlexPen Inject 6 Units into the skin 2 (two) times daily with a meal. Breakfast and dinner    ? insulin glargine (LANTUS) 100 UNIT/ML Solostar Pen Inject 8 Units into the skin at bedtime.    ? LACTOBACILLUS PO Take 1 capsule by mouth every Monday, Wednesday, and Friday.    ? loratadine (CLARITIN) 10 MG tablet Take 10 mg by mouth.    ? megestrol (MEGACE ES) 625 MG/5ML suspension Take 5 mLs (625 mg total) by mouth daily. 150 mL 1  ? metoprolol tartrate (LOPRESSOR) 25 MG tablet Take 1 tablet (25 mg total) by mouth 2 (two) times daily.    ? Olopatadine HCl (PATADAY OP) Place 1 drop into both eyes daily as needed (allergies).    ? pantoprazole (PROTONIX) 20 MG tablet TAKE 1 TABLET BY MOUTH EVERY DAY 90 tablet 0  ? Potassium 99 MG TABS Take 1 tablet (99 mg total) by mouth daily as needed (when you take 2 lasix pills).    ? sertraline (ZOLOFT) 25 MG tablet Take 1 tablet (25 mg total) by mouth daily.    ? silver sulfADIAZINE (SILVADENE) 1 % cream Apply 1 application topically daily. To sore on buttocks daily 50 g 0  ?  amoxicillin-clavulanate (AUGMENTIN) 500-125 MG tablet Take 1 tablet by mouth 2 (two) times daily.    ? ?No facility-administered medications prior to visit.  ?  ? ?Per HPI unless specifically indicated in ROS section below ?Review of Systems  ?Constitutional:  Negative for activity change, appetite change, chills, fatigue, fever and unexpected weight change.  ?HENT:  Negative for hearing loss.   ?Eyes:  Negative for visual disturbance.  ?Respiratory:  Negative for cough, chest tightness, shortness of breath and wheezing.   ?Cardiovascular:  Negative for  chest pain, palpitations and leg swelling.  ?Gastrointestinal:  Positive for diarrhea. Negative for abdominal distention, abdominal pain, blood in stool, constipation, nausea and vomiting.  ?Genitourinary:  Negative for difficulty urinating and hematuria.  ?Musculoskeletal:  Negative for arthralgias, myalgias and neck pain.  ?Skin:  Negative for rash.  ?Neurological:  Negative for dizziness, seizures, syncope and headaches.  ?Hematological:  Negative for adenopathy. Does not bruise/bleed easily.  ?Psychiatric/Behavioral:  Negative for dysphoric mood. The patient is not nervous/anxious.   ? ?Objective:  ?BP 140/60   Pulse 79   Temp 98 ?F (36.7 ?C) (Temporal)   Ht 5' 8" (1.727 m)   Wt 145 lb 8 oz (66 kg)   SpO2 96%   BMI 22.12 kg/m?   ?Wt Readings from Last 3 Encounters:  ?05/28/21 145 lb 8 oz (66 kg)  ?04/28/21 139 lb 11.2 oz (63.4 kg)  ?04/15/21 138 lb 9.6 oz (62.9 kg)  ?  ?  ?Physical Exam ?Vitals and nursing note reviewed.  ?Constitutional:   ?   General: He is not in acute distress. ?   Appearance: Normal appearance. He is well-developed. He is not ill-appearing.  ?   Comments:  ?In transport chair ?Needs assistance to stand  ?HENT:  ?   Head: Normocephalic and atraumatic.  ?   Right Ear: Hearing, tympanic membrane, ear canal and external ear normal.  ?   Left Ear: Hearing, tympanic membrane, ear canal and external ear normal.  ?Eyes:  ?   General: No  scleral icterus. ?   Extraocular Movements: Extraocular movements intact.  ?   Conjunctiva/sclera: Conjunctivae normal.  ?   Pupils: Pupils are equal, round, and reactive to light.  ?Neck:  ?   Thyroid: No thyroid mass or thyromegaly.

## 2021-05-28 NOTE — Patient Instructions (Addendum)
Urinalysis today  ?Try myrbetriq for bladder  ?We will refer you to home health for physical therapy and to check wounds.  ?Vaseline dressing change daily to right lower leg.  ?Return in 3 months for follow up visit.  ? ?Health Maintenance After Age 79 ?After age 38, you are at a higher risk for certain long-term diseases and infections as well as injuries from falls. Falls are a major cause of broken bones and head injuries in people who are older than age 59. Getting regular preventive care can help to keep you healthy and well. Preventive care includes getting regular testing and making lifestyle changes as recommended by your health care provider. Talk with your health care provider about: ?Which screenings and tests you should have. A screening is a test that checks for a disease when you have no symptoms. ?A diet and exercise plan that is right for you. ?What should I know about screenings and tests to prevent falls? ?Screening and testing are the best ways to find a health problem early. Early diagnosis and treatment give you the best chance of managing medical conditions that are common after age 59. Certain conditions and lifestyle choices may make you more likely to have a fall. Your health care provider may recommend: ?Regular vision checks. Poor vision and conditions such as cataracts can make you more likely to have a fall. If you wear glasses, make sure to get your prescription updated if your vision changes. ?Medicine review. Work with your health care provider to regularly review all of the medicines you are taking, including over-the-counter medicines. Ask your health care provider about any side effects that may make you more likely to have a fall. Tell your health care provider if any medicines that you take make you feel dizzy or sleepy. ?Strength and balance checks. Your health care provider may recommend certain tests to check your strength and balance while standing, walking, or changing  positions. ?Foot health exam. Foot pain and numbness, as well as not wearing proper footwear, can make you more likely to have a fall. ?Screenings, including: ?Osteoporosis screening. Osteoporosis is a condition that causes the bones to get weaker and break more easily. ?Blood pressure screening. Blood pressure changes and medicines to control blood pressure can make you feel dizzy. ?Depression screening. You may be more likely to have a fall if you have a fear of falling, feel depressed, or feel unable to do activities that you used to do. ?Alcohol use screening. Using too much alcohol can affect your balance and may make you more likely to have a fall. ?Follow these instructions at home: ?Lifestyle ?Do not drink alcohol if: ?Your health care provider tells you not to drink. ?If you drink alcohol: ?Limit how much you have to: ?0-1 drink a day for women. ?0-2 drinks a day for men. ?Know how much alcohol is in your drink. In the [place] one drink equals one 12 oz bottle of beer (355 mL), one 5 oz glass of wine (148 mL), or one 1? oz glass of hard liquor (44 mL). ?Do not use any products that contain nicotine or tobacco. These products include cigarettes, chewing tobacco, and vaping devices, such as e-cigarettes. If you need help quitting, ask your health care provider. ?Activity ? ?Follow a regular exercise program to stay fit. This will help you maintain your balance. Ask your health care provider what types of exercise are appropriate for you. ?If you need a cane or walker, use it as recommended  by your health care provider. ?Wear supportive shoes that have nonskid soles. ?Safety ? ?Remove any tripping hazards, such as rugs, cords, and clutter. ?Install safety equipment such as grab bars in bathrooms and safety rails on stairs. ?Keep rooms and walkways well-lit. ?General instructions ?Talk with your health care provider about your risks for falling. Tell your health care provider if: ?You fall. Be sure to tell your  health care provider about all falls, even ones that seem minor. ?You feel dizzy, tiredness (fatigue), or off-balance. ?Take over-the-counter and prescription medicines only as told by your health care provider. These include supplements. ?Eat a healthy diet and maintain a healthy weight. A healthy diet includes low-fat dairy products, low-fat (lean) meats, and fiber from whole grains, beans, and lots of fruits and vegetables. ?Stay current with your vaccines. ?Schedule regular health, dental, and eye exams. ?Summary ?Having a healthy lifestyle and getting preventive care can help to protect your health and wellness after age 31. ?Screening and testing are the best way to find a health problem early and help you avoid having a fall. Early diagnosis and treatment give you the best chance for managing medical conditions that are more common for people who are older than age 18. ?Falls are a major cause of broken bones and head injuries in people who are older than age 11. Take precautions to prevent a fall at home. ?Work with your health care provider to learn what changes you can make to improve your health and wellness and to prevent falls. ?This information is not intended to replace advice given to you by your health care provider. Make sure you discuss any questions you have with your health care provider. ?Document Revised: 07/13/2020 Document Reviewed: 07/13/2020 ?Elsevier Patient Education ? [place]. ? ?

## 2021-05-29 ENCOUNTER — Encounter: Payer: Self-pay | Admitting: Hematology

## 2021-05-30 NOTE — Progress Notes (Signed)
?  Subjective:  ?Patient ID: Jeffrey [name], male    DOB: August 15, 1940,  MRN: [id number] ? ?DUSAN LIPFORD presents to clinic today for at risk foot care with history of diabetic neuropathy and painful elongated mycotic toenails 1-5 bilaterally which are tender when wearing enclosed shoe gear. Pain is relieved with periodic professional debridement. ? ?Patient states blood glucose was 191 mg/dl today.  Last HgA1c was unknown ? ?New problem(s): None.  ? ?PCP is [name], MD , and last visit was May 29, 2022. ? ?Allergies  ?Allergen Reactions  ? Niacin Other (See Comments)  ?  flushed skin/rash ?  ? ? ?Review of Systems: Negative except as noted in the HPI. ? ?Objective: No changes noted in today's physical examination. ?Constitutional Patient is a pleasant 81 y.o. Caucasian male WD, WN in NAD. AAO x 3.  ?Vascular Capillary fill time to digits <3 seconds b/l lower extremities. Faintly palpable DP pulse(s) b/l lower extremities. Nonpalpable PT pulse(s) b/l lower extremities. Pedal hair absent. Lower extremity skin temperature gradient within normal limits. No pain with calf compression b/l. Trace edema BLE. No cyanosis or clubbing noted.  ?Neurologic Normal speech. Protective sensation intact 5/5 intact bilaterally with 10g monofilament b/l. Vibratory sensation intact b/l.  ?Dermatologic Pedal skin is thin shiny, atrophic b/l lower extremities. No open wounds b/l lower extremities. No interdigital macerations b/l lower extremities. Toenails 1-5 b/l elongated, discolored, dystrophic, thickened, crumbly with subungual debris and tenderness to dorsal palpation.Incurvated nailplate right great toe lateral border(s) with tenderness to palpation. No erythema, no edema, no drainage noted.   ?Orthopedic: Normal muscle strength 5/5 to all lower extremity muscle groups LLE.  Hemiparesis of RLE. Utilizes wheelchair for mobility.  ? ? ?  Latest Ref Rng & Units 05/20/2021  ? 10:57 AM 10/14/2020  ? 12:20 PM  ?Hemoglobin A1C   ?Hemoglobin-A1c 4.6 - 6.5 % 7.9   7.4    ? ?Assessment/Plan: ?1. Pain due to onychomycosis of toenails of both feet   ?2. DM type 2 with diabetic peripheral neuropathy ([place])   ?  ?-No new findings. No new orders. ?-Mycotic toenails 1-5 bilaterally were debrided in length and girth with sterile nail nippers and dremel without incident. ?-Patient/POA to call should there be question/concern in the interim.  ? ?Return in about 3 months (around 08/21/2021). ? ?Marzetta Board, DPM  ?

## 2021-05-31 ENCOUNTER — Encounter: Payer: Self-pay | Admitting: Family Medicine

## 2021-05-31 DIAGNOSIS — S81801A Unspecified open wound, right lower leg, initial encounter: Secondary | ICD-10-CM | POA: Insufficient documentation

## 2021-05-31 DIAGNOSIS — L89159 Pressure ulcer of sacral region, unspecified stage: Secondary | ICD-10-CM | POA: Insufficient documentation

## 2021-05-31 NOTE — Assessment & Plan Note (Signed)
Followed by cardiology 

## 2021-05-31 NOTE — Assessment & Plan Note (Signed)
Stable period on low dose sertraline - continue.  ?

## 2021-05-31 NOTE — Assessment & Plan Note (Addendum)
History of this.  ?Will defer colon cancer screening due to active esophageal cancer treatment.  ?

## 2021-05-31 NOTE — Assessment & Plan Note (Signed)
Advanced directive discussion - HCPOA would be daughter Jeffrey [name]. Has advanced directive at home. Asked to bring [place] a copy. DNR rescinded. ?

## 2021-05-31 NOTE — Assessment & Plan Note (Signed)
Discussed care - rec cushioned bandage q weekly.  ?Will also ask [facility] wound care nurse to evaluate patient's wounds.  ?

## 2021-05-31 NOTE — Assessment & Plan Note (Signed)
Chronic, adequate. Continue current regimen.  

## 2021-05-31 NOTE — Assessment & Plan Note (Deleted)

## 2021-05-31 NOTE — Assessment & Plan Note (Signed)
Chronic, adequate on high dose atorvastatin - continue. ?The ASCVD Risk score (Arnett DK, et al., 2019) failed to calculate for the following reasons: ?  The valid total cholesterol range is 130 to 320 mg/dL  ?

## 2021-05-31 NOTE — Assessment & Plan Note (Signed)
Large slow to heal wound to RLE appears dry.  ?They've been using silver sulfadene. rec stop this as it may slow wound healing, in its place start vaseline dressing changes.  ?Will also ask [place] wound care nurse to evaluate patient's wounds.  ?

## 2021-05-31 NOTE — Assessment & Plan Note (Signed)
Update UA, trial myrbetriq. ?Avoid antimuscarinics due to anticholinergic side effects.  ?

## 2021-05-31 NOTE — Assessment & Plan Note (Signed)
Several falls in the past year, latest one was down for prolonged period of time. Now wearing cell phone at all times. Will refer to home health PT.  ?

## 2021-05-31 NOTE — Assessment & Plan Note (Signed)
Chronic since CVA 2016.  ?Will refer to PT.  ?

## 2021-05-31 NOTE — Assessment & Plan Note (Signed)
Appreciate endo care. Continues jardiance with lantus.  ?

## 2021-05-31 NOTE — Assessment & Plan Note (Signed)
Appreciate onc care. He completed chemo and XRT late last year, now on maintenance nivolumab immunotherapy, regularly sees onc. Overall stable period with weight gain noted on megace. ?

## 2021-05-31 NOTE — Assessment & Plan Note (Signed)
Continue statin, plavix.  

## 2021-05-31 NOTE — Assessment & Plan Note (Signed)
Preventative protocols reviewed and updated unless pt declined. Discussed healthy diet and lifestyle.  

## 2021-05-31 NOTE — Assessment & Plan Note (Signed)
No recent flares off allopurinol.  ?

## 2021-06-01 ENCOUNTER — Encounter: Payer: Self-pay | Admitting: Hematology

## 2021-06-09 ENCOUNTER — Inpatient Hospital Stay (HOSPITAL_BASED_OUTPATIENT_CLINIC_OR_DEPARTMENT_OTHER): Payer: PPO | Admitting: Hematology

## 2021-06-09 ENCOUNTER — Encounter: Payer: Self-pay | Admitting: Hematology

## 2021-06-09 ENCOUNTER — Inpatient Hospital Stay: Payer: PPO | Attending: Nurse Practitioner

## 2021-06-09 ENCOUNTER — Inpatient Hospital Stay: Payer: PPO

## 2021-06-09 ENCOUNTER — Other Ambulatory Visit: Payer: Self-pay

## 2021-06-09 VITALS — BP 156/72 | HR 72 | Temp 98.2°F | Resp 18 | Ht 68.0 in | Wt 150.9 lb

## 2021-06-09 DIAGNOSIS — C160 Malignant neoplasm of cardia: Secondary | ICD-10-CM | POA: Diagnosis not present

## 2021-06-09 DIAGNOSIS — Z5112 Encounter for antineoplastic immunotherapy: Secondary | ICD-10-CM | POA: Diagnosis not present

## 2021-06-09 DIAGNOSIS — Z79899 Other long term (current) drug therapy: Secondary | ICD-10-CM | POA: Diagnosis not present

## 2021-06-09 DIAGNOSIS — C158 Malignant neoplasm of overlapping sites of esophagus: Secondary | ICD-10-CM | POA: Diagnosis not present

## 2021-06-09 DIAGNOSIS — C155 Malignant neoplasm of lower third of esophagus: Secondary | ICD-10-CM

## 2021-06-09 LAB — CBC WITH DIFFERENTIAL (CANCER CENTER ONLY)
Abs Immature Granulocytes: 0.03 10*3/uL (ref 0.00–0.07)
Basophils Absolute: 0 10*3/uL (ref 0.0–0.1)
Basophils Relative: 0 %
Eosinophils Absolute: 0.1 10*3/uL (ref 0.0–0.5)
Eosinophils Relative: 1 %
HCT: 35.6 % — ABNORMAL LOW (ref 39.0–52.0)
Hemoglobin: 12.1 g/dL — ABNORMAL LOW (ref 13.0–17.0)
Immature Granulocytes: 0 %
Lymphocytes Relative: 10 %
Lymphs Abs: 0.8 10*3/uL (ref 0.7–4.0)
MCH: 30.9 pg (ref 26.0–34.0)
MCHC: 34 g/dL (ref 30.0–36.0)
MCV: 90.8 fL (ref 80.0–100.0)
Monocytes Absolute: 0.6 10*3/uL (ref 0.1–1.0)
Monocytes Relative: 9 %
Neutro Abs: 6.1 10*3/uL (ref 1.7–7.7)
Neutrophils Relative %: 80 %
Platelet Count: 140 10*3/uL — ABNORMAL LOW (ref 150–400)
RBC: 3.92 MIL/uL — ABNORMAL LOW (ref 4.22–5.81)
RDW: 13.1 % (ref 11.5–15.5)
WBC Count: 7.6 10*3/uL (ref 4.0–10.5)
nRBC: 0 % (ref 0.0–0.2)

## 2021-06-09 LAB — CMP (CANCER CENTER ONLY)
ALT: 22 U/L (ref 0–44)
AST: 13 U/L — ABNORMAL LOW (ref 15–41)
Albumin: 3.2 g/dL — ABNORMAL LOW (ref 3.5–5.0)
Alkaline Phosphatase: 96 U/L (ref 38–126)
Anion gap: 6 (ref 5–15)
BUN: 22 mg/dL (ref 8–23)
CO2: 26 mmol/L (ref 22–32)
Calcium: 8.8 mg/dL — ABNORMAL LOW (ref 8.9–10.3)
Chloride: 105 mmol/L (ref 98–111)
Creatinine: 0.76 mg/dL (ref 0.61–1.24)
GFR, Estimated: >60 mL/min (ref >60)
Glucose, Bld: 326 mg/dL — ABNORMAL HIGH (ref 70–99)
Potassium: 3.5 mmol/L (ref 3.5–5.1)
Sodium: 137 mmol/L (ref 135–145)
Total Bilirubin: 0.4 mg/dL (ref 0.3–1.2)
Total Protein: 6.2 g/dL — ABNORMAL LOW (ref 6.5–8.1)

## 2021-06-09 LAB — TSH: TSH: 1.036 u[IU]/mL (ref 0.320–4.118)

## 2021-06-09 MED ORDER — SODIUM CHLORIDE 0.9 % IV SOLN: Freq: Once | INTRAVENOUS | Status: AC

## 2021-06-09 MED ORDER — SODIUM CHLORIDE 0.9 % IV SOLN
480.0000 mg | Freq: Once | INTRAVENOUS | Status: AC
  Administered 2021-06-09: 480 mg via INTRAVENOUS
  Filled 2021-06-09: qty 48

## 2021-06-09 NOTE — Progress Notes (Signed)
?Jeffrey [name]   ?[phone] [phone]   ?Clinic Follow up Note  ? ?Patient Care Team: ?[name], MD as PCP - General (Family Medicine) ?Jerline Pain, MD as PCP - Cardiology (Cardiology) ?Delia Chimes, NP (Inactive) as Nurse Practitioner (Nurse Practitioner) ?Ladene Artist, MD as Consulting Physician (Gastroenterology) ?Alla Feeling, NP as Nurse Practitioner (Nurse Practitioner) ?Truitt Merle, MD as Consulting Physician (Hematology) ?Truitt Merle, MD as Consulting Physician (Oncology) ? ?Date of Service:  06/09/2021 ? ?CHIEF COMPLAINT: f/u of esophageal cancer ? ?CURRENT THERAPY:  ?Nivolumab, q2weeks, starting 03/31/21 ? ?ASSESSMENT & PLAN:  ?Jeffrey [name] is a 81 y.o. male with  ? ?1. Adenocarcinoma of the GE junction, cTX N1 M0, HER2 negative, MMR normal, PD-L1 CPS 3% ?-He presented with 6-8 months of dysphagia and 10 pounds weight loss and a palpable right cervical lymph node. Initial CT neck 10/22/20 showed 2.8 cm LN, concerning for metastatic disease.  ?-CT 12/08/20 showed suspicious mass at GE junction and prominent gastrohepatic LNs. EGD 12/17/20 by Dr. Fuller Plan showed 5 cm distal esophageal mass, biopsy confirmed adenocarcinoma with signet ring features. HER 2 negative ?-PET scan 12/30/20 showed FDG-avid mass at the GE junction and mild uptake with borderline gastrohepatic ligament lymph nodes, no distant metastasis. ?-He began concurrent chemo RT with weekly carbo/Taxol 01/18/21, C1 carbo was dose reduced to AUC 1.5 for age and co-morbidities. He tolerated moderately well with fatigue, decreased appetite/taste change, nausea, and constipation. ?-cervical lymph node biopsy on 01/29/21 showed atypical cells.  ?-he completed chemo on 12/19 and radiation on 02/26/21. He had slow recovery ?-he began nivolumab on 03/31/21; he tolerated well, no noticeable side effects. Labs reviewed, overall stable, adequate to proceed with fourth dose today. We will plan to increase dose  to every 4 weeks from today  ?-plan to repeat PET in late April; I ordered  ?  ?2. Symptom Management: Weight loss, diarrhea, urinary frequency ?-he has been gaining weight back lately ?  ?3. Goals of care ?-He understands if work-up confirms metastatic disease, it will not be curable but still treatable, treatment will be palliative ?  ?4. Comorbidities: CVA with residual right-sided weakness, history of MI, CAD, HTN, DM, HL ?-Patient lives alone, his daughter checks on him frequently to help with housework and food prep ?-He is limited in his ADLs due to residual right-sided weakness from CVA ?-He ambulates with support, can bathe and dress with difficulty ?  ?  ?PLAN: ?-proceed with cycle 5 Nivo today at increase dose 439m  ?-PET scan to be done in 3-4 weeks  ?-lab, f/u, and nivo in 4 weeks ? ? ?No problem-specific Assessment & Plan notes found for this encounter. ? ? ?SUMMARY OF ONCOLOGIC HISTORY: ?Oncology History Overview Note  ?Cancer Staging ?Esophageal cancer ([facility] ?Staging form: Esophagus - Adenocarcinoma, AJCC 8th Edition ?- Clinical stage from 12/31/2020: Stage IIA (cT1, cN1, cM0, GX) - Unsigned ? ?  ?Esophageal cancer ([facility]  ?10/22/2020 Imaging  ? CT neck IMPRESSION: ?2.8 cm right level 2A lymph node, which appears to be centrally ?necrotic, concerning for metastatic disease. No additional ?lymphadenopathy or other acute process in the neck. ?  ?  ?11/30/2020 Imaging  ? Barium swallow FINDINGS: ?Limitations on positioning due to sequelae of stroke. The patient ?swallowed barium without difficulty. No aspiration. There is no mass ?or stricture. Normal motility. There is no hiatal hernia. No ?spontaneous gastroesophageal reflux. Barium pill was swallowed and ?passed without difficulty. ?  ?IMPRESSION: ?No significant abnormality. ?  ?  12/08/2020 Imaging  ? CT chest abdomen with contrast IMPRESSION: ?1. Mass is suspected just at the level of the GE junction. Suspicious for primary esophageal neoplasm. Further  evaluation with endoscopy is advised. ?2. Prominent perigastric lymph nodes are identified, suspicious for metastatic adenopathy. ?3. Gallstone. ?4. Coronary artery calcifications. ?5. Aortic Atherosclerosis (ICD10-I70.0) and Emphysema (ICD10-J43.9). ?6. These results will be called to the ordering clinician or ?representative by the Radiologist Assistant, and communication ?documented in the PACS or [employer]. ?  ?  ?12/17/2020 Procedure  ? EGD by Dr. Fuller Plan ?- A medium-sized, fungating mass with contact bleeding was found in the distal esophagus, ?36 cm from the incisors extending to 41 cm in the cardia. The mass was partially obstructing ?and circumferential. ?  ?12/17/2020 Initial Biopsy  ? Diagnosis ?Esophagus, biopsy, distal and gastric cardia mass ?- ADENOCARCINOMA WITH SIGNET RING FEATURES. ?  ?12/19/2020 Initial Diagnosis  ? Esophageal cancer ([place]) ?  ?12/30/2020 PET scan  ? IMPRESSION: ?1. Mass at the level of the GE junction and proximal stomach is FDG avid within SUV max of 7.62. Findings compatible with primary gastric neoplasm. ?2. There is mild FDG uptake associated with borderline gastrohepatic ligament lymph nodes which have an SUV max of 2.97. Equivocal for nodal metastasis. ?3. No signs of distant metastatic disease. ?4. Aortic Atherosclerosis (ICD10-I70.0) and Emphysema (ICD10-J43.9). Coronary artery calcifications. ?5. Gallstones. ?6. Prostate gland enlargement. ?  ?01/08/2021 Imaging  ? EXAM: ?ULTRASOUND OF HEAD/NECK SOFT TISSUES ? ?IMPRESSION: ?Sonographic evaluation of the right-side of the neck confirms a ?minimally complex cystic lesion with broad differential ?considerations including cystic/necrotic cervical lymphadenopathy ?(favored given presence of an additional smaller apparently ?partially cystic right cervical lymph node), compatible with the ?findings on preceding PET-CT. ?  ?This minimally complex cystic structure/mass could undergo ?ultrasound-guided biopsy/aspiration as  indicated. ?  ?01/18/2021 - 02/22/2021 Chemotherapy  ? Patient is on Treatment Plan : ESOPHAGUS Carboplatin/PACLitaxel weekly x 6 weeks with XRT    ?   ?01/29/2021 Pathology Results  ? A. RIGHT, SUBMANDIBULAR CYST, FINE NEEDLE  ?ASPIRATION:  ? ?FINAL MICROSCOPIC DIAGNOSIS:  ?- Atypical cells present ?DIAGNOSTIC COMMENTS:  ?There are clusters of epithelioid cells with nuclear grooves and  ?pseudoinclusions admixed with pigmented histiocytes.  There is  ?insufficient material for ancillary studies.  Excisional biopsy may be  ?of interest. ?  ?03/31/2021 -  Chemotherapy  ? Patient is on Treatment Plan : HEAD/NECK Nivolumab q14d  ?   ? ? ? ?INTERVAL HISTORY:  ?Jeffrey [name] is here for a follow up of esophageal cancer. He was last seen by me on 05/27/21. He presents to the clinic accompanied by his daughter. ?He reports he is doing well overall. He has gained weight since last visit. His daughter notes fatigue but adds this could have other causes. ?  ?All other systems were reviewed with the patient and are negative. ? ?MEDICAL HISTORY:  ?Past Medical History:  ?Diagnosis Date  ? Anterior cerebral circulation hemorrhagic infarction ([place]) 11/24/2014  ? March, 2016, dominant left thalamic and left internal capsule ischemic infarct with resultant hemorrhagic transformation, secondary to small vessel disease. Resulted in right hemiparesis dysarthria and diplopia   //   readmission with aphasia July, 2016, this improved   ? Arthritis   ? CAD (coronary artery disease)   ? PCI distal RCA ...2004, residual 70% LAD   /   ...nuclear...03/2007...no ischemia.Marland KitchenMarland Kitchenpreserved LV /  nuclear...03/03/2009...inferior scar..no ischemia..EF 51%  ? Cyst of nasopharynx   ? per ENT Wilburn Cornelia  ?  Dyslipidemia   ? takes Atorvastatin daily  ? GERD (gastroesophageal reflux disease)   ? takes Omeprazole daily  ? Gout   ? takes Allopurinol daily and Colchicine as needed  ? HCAP (healthcare-associated pneumonia)   ? Hemiparesis affecting right side as  late effect of cerebrovascular accident ([place]) 09/05/2014  ? History of colon polyps   ? HTN (hypertension)   ? takes Cardura,Metoprolol,[name],and Amlodipine daily  ? Internal hemorrhoids   ? Myocardial in

## 2021-06-09 NOTE — Patient Instructions (Signed)
[facility]COL[facility]  Discharge Instructions: ?Thank you for choosing [facility] to provide your oncology and hematology care.  ? ?If you have a lab appointment with the Cancer Center, please go directly to the Cancer Center and check in at the registration area. ?  ?Wear comfortable clothing and clothing appropriate for easy access to any Portacath or PICC line.  ? ?We strive to give you quality time with your provider. You may need to reschedule your appointment if you arrive late (15 or more minutes).  Arriving late affects you and other patients whose appointments are after yours.  Also, if you miss three or more appointments without notifying the office, you may be dismissed from the clinic at the provider?s discretion.    ?  ?For prescription refill requests, have your pharmacy contact our office and allow 72 hours for refills to be completed.   ? ?Today you received the following chemotherapy and/or immunotherapy agents Opdivo    ?  ?To help prevent nausea and vomiting after your treatment, we encourage you to take your nausea medication as directed. ? ?BELOW ARE SYMPTOMS THAT SHOULD BE REPORTED IMMEDIATELY: ?*FEVER GREATER THAN 100.4 F (38 ?C) OR HIGHER ?*CHILLS OR SWEATING ?*NAUSEA AND VOMITING THAT IS NOT CONTROLLED WITH YOUR NAUSEA MEDICATION ?*UNUSUAL SHORTNESS OF BREATH ?*UNUSUAL BRUISING OR BLEEDING ?*URINARY PROBLEMS (pain or burning when urinating, or frequent urination) ?*BOWEL PROBLEMS (unusual diarrhea, constipation, pain near the anus) ?TENDERNESS IN MOUTH AND THROAT WITH OR WITHOUT PRESENCE OF ULCERS (sore throat, sores in mouth, or a toothache) ?UNUSUAL RASH, SWELLING OR PAIN  ?UNUSUAL VAGINAL DISCHARGE OR ITCHING  ? ?Items with * indicate a potential emergency and should be followed up as soon as possible or go to the Emergency Department if any problems should occur. ? ?Please show the CHEMOTHERAPY ALERT CARD or IMMUNOTHERAPY ALERT CARD at check-in to the  Emergency Department and triage nurse. ? ?Should you have questions after your visit or need to cancel or reschedule your appointment, please contact [facility]  Dept: [phone]  and follow the prompts.  Office hours are 8:00 a.m. to 4:30 p.m. Monday - Friday. Please note that voicemails left after 4:00 p.m. may not be returned until the following business day.  We are closed weekends and major holidays. You have access to a nurse at all times for urgent questions. Please call the main number to the clinic Dept: [phone] and follow the prompts. ? ? ?For any non-urgent questions, you may also contact your provider using MyChart. We now offer e-Visits for anyone 18 and older to request care online for non-urgent symptoms. For details visit [email or web address]. ?  ?Also download the MyChart app! Go to the app store, search "MyChart", open the app, select [facility], and log in with your MyChart username and password. ? ?Due to Covid, a mask is required upon entering the hospital/clinic. If you do not have a mask, one will be given to you upon arrival. For doctor visits, patients may have 1 support person aged 18 or older with them. For treatment visits, patients cannot have anyone with them due to current Covid guidelines and our immunocompromised population.  ? ?

## 2021-06-10 LAB — T4: T4, Total: 5.5 ug/dL (ref 4.5–12.0)

## 2021-06-14 ENCOUNTER — Other Ambulatory Visit: Payer: Self-pay | Admitting: Family Medicine

## 2021-06-21 ENCOUNTER — Telehealth: Payer: Self-pay

## 2021-06-21 NOTE — Telephone Encounter (Signed)
Jeffrey [name], patient's daughter, called stating that patient has had more swelling in his legs the last few days and today worse, when daughter puts her fingers on his legs it puts a dent in the swelling. No warmth or blistering present so far. Some swelling in the ankles also. Patient is going through chemotherapy right now also. Patient does not have any increase chest pain or tightness. Patient is in a wheelchair and they have not weighed him but do have a scale at home if need to be weighed. Alyse Low wonders if patient needs to increase his fluid pill. ?Please call Jeffrey [name] back at [phone] ? ?Current dose of Lasix is 20 mg on Mon, Wed, Fri, and Sun. Take 40 mg on Tues, Thurs, and Sat ?

## 2021-06-21 NOTE — Telephone Encounter (Signed)
Patient's daughter Alyse Low notified as instructed by telephone and verbalized understanding. Alyse Low stated that her dad is taking Jardiance daily. Alyse Low stated that she is not sure about his urine output when he takes the Lasix. Alyse Low stated that she will make the adjustment on his Lasix tomorrow because she will not be going back to his house until then. Alyse Low stated that she will get her dad's weight tomorrow and will let Dr. Danise Mina know about his urine output with the Lasix doses.  ?

## 2021-06-21 NOTE — Telephone Encounter (Signed)
Do recommend he weigh daily if able, if not then at least twice a week.  ?Plz ensure still taking jardiance daily.  ?How is urine output when he takes lasix '20mg'$  vs '40mg'$ ?  ?Recommend increase lasix to '40mg'$  daily for the next 5 days, update [place] with effect. If not improving, may need OV with me or cardiology for further evaluation.  ?

## 2021-06-25 ENCOUNTER — Ambulatory Visit (HOSPITAL_COMMUNITY)
Admission: RE | Admit: 2021-06-25 | Discharge: 2021-06-25 | Disposition: A | Discharge status: Home / Self Care | Payer: PPO | Source: Ambulatory Visit | Attending: Hematology | Admitting: Hematology

## 2021-06-25 ENCOUNTER — Other Ambulatory Visit: Payer: Self-pay | Admitting: Hematology

## 2021-06-25 DIAGNOSIS — C158 Malignant neoplasm of overlapping sites of esophagus: Secondary | ICD-10-CM

## 2021-06-25 DIAGNOSIS — K8020 Calculus of gallbladder without cholecystitis without obstruction: Secondary | ICD-10-CM | POA: Diagnosis not present

## 2021-06-25 DIAGNOSIS — I2510 Atherosclerotic heart disease of native coronary artery without angina pectoris: Secondary | ICD-10-CM | POA: Insufficient documentation

## 2021-06-25 DIAGNOSIS — J439 Emphysema, unspecified: Secondary | ICD-10-CM | POA: Diagnosis not present

## 2021-06-25 DIAGNOSIS — I700 Atherosclerosis of aorta: Secondary | ICD-10-CM | POA: Insufficient documentation

## 2021-06-25 DIAGNOSIS — C760 Malignant neoplasm of head, face and neck: Secondary | ICD-10-CM | POA: Diagnosis not present

## 2021-06-25 LAB — GLUCOSE, CAPILLARY: Glucose-Capillary: 193 mg/dL — ABNORMAL HIGH (ref 70–99)

## 2021-06-25 MED ORDER — FLUDEOXYGLUCOSE F - 18 (FDG) INJECTION
7.3000 | Freq: Once | INTRAVENOUS | Status: AC
  Administered 2021-06-25: 7.3 via INTRAVENOUS

## 2021-06-28 ENCOUNTER — Telehealth: Payer: Self-pay

## 2021-06-28 NOTE — Telephone Encounter (Signed)
Pt's daughter called wanting to go over the pt's recent PET results.  Pt's daughter stated they can see the results on MyChart but is expecting to hear back from Dr. Burr Medico or Cira Rue, NP.  Sent Pt Call Message to Dr. Burr Medico regarding pt's call. ?

## 2021-07-08 ENCOUNTER — Inpatient Hospital Stay (HOSPITAL_BASED_OUTPATIENT_CLINIC_OR_DEPARTMENT_OTHER): Payer: PPO | Admitting: Hematology

## 2021-07-08 ENCOUNTER — Inpatient Hospital Stay: Payer: PPO | Attending: Nurse Practitioner

## 2021-07-08 ENCOUNTER — Other Ambulatory Visit: Payer: Self-pay

## 2021-07-08 ENCOUNTER — Inpatient Hospital Stay: Payer: PPO

## 2021-07-08 VITALS — BP 150/80 | HR 71 | Temp 98.3°F | Resp 18 | Ht 68.0 in | Wt 153.3 lb

## 2021-07-08 DIAGNOSIS — C160 Malignant neoplasm of cardia: Secondary | ICD-10-CM | POA: Diagnosis not present

## 2021-07-08 DIAGNOSIS — Z5112 Encounter for antineoplastic immunotherapy: Secondary | ICD-10-CM | POA: Diagnosis not present

## 2021-07-08 DIAGNOSIS — C158 Malignant neoplasm of overlapping sites of esophagus: Secondary | ICD-10-CM

## 2021-07-08 DIAGNOSIS — Z79899 Other long term (current) drug therapy: Secondary | ICD-10-CM | POA: Diagnosis not present

## 2021-07-08 DIAGNOSIS — C155 Malignant neoplasm of lower third of esophagus: Secondary | ICD-10-CM

## 2021-07-08 LAB — CEA (IN HOUSE-CHCC): CEA (CHCC-In House): 3.54 ng/mL (ref 0.00–5.00)

## 2021-07-08 LAB — CMP (CANCER CENTER ONLY)
ALT: 24 U/L (ref 0–44)
AST: 18 U/L (ref 15–41)
Albumin: 3.7 g/dL (ref 3.5–5.0)
Alkaline Phosphatase: 99 U/L (ref 38–126)
Anion gap: 6 (ref 5–15)
BUN: 24 mg/dL — ABNORMAL HIGH (ref 8–23)
CO2: 25 mmol/L (ref 22–32)
Calcium: 9 mg/dL (ref 8.9–10.3)
Chloride: 109 mmol/L (ref 98–111)
Creatinine: 0.87 mg/dL (ref 0.61–1.24)
GFR, Estimated: >60 mL/min (ref >60)
Glucose, Bld: 120 mg/dL — ABNORMAL HIGH (ref 70–99)
Potassium: 3.3 mmol/L — ABNORMAL LOW (ref 3.5–5.1)
Sodium: 140 mmol/L (ref 135–145)
Total Bilirubin: 0.7 mg/dL (ref 0.3–1.2)
Total Protein: 6.7 g/dL (ref 6.5–8.1)

## 2021-07-08 LAB — CBC WITH DIFFERENTIAL (CANCER CENTER ONLY)
Abs Immature Granulocytes: 0.01 10*3/uL (ref 0.00–0.07)
Basophils Absolute: 0 10*3/uL (ref 0.0–0.1)
Basophils Relative: 0 %
Eosinophils Absolute: 0.1 10*3/uL (ref 0.0–0.5)
Eosinophils Relative: 2 %
HCT: 38.9 % — ABNORMAL LOW (ref 39.0–52.0)
Hemoglobin: 13.7 g/dL (ref 13.0–17.0)
Immature Granulocytes: 0 %
Lymphocytes Relative: 15 %
Lymphs Abs: 0.9 10*3/uL (ref 0.7–4.0)
MCH: 31.2 pg (ref 26.0–34.0)
MCHC: 35.2 g/dL (ref 30.0–36.0)
MCV: 88.6 fL (ref 80.0–100.0)
Monocytes Absolute: 0.6 10*3/uL (ref 0.1–1.0)
Monocytes Relative: 10 %
Neutro Abs: 4.3 10*3/uL (ref 1.7–7.7)
Neutrophils Relative %: 73 %
Platelet Count: 142 10*3/uL — ABNORMAL LOW (ref 150–400)
RBC: 4.39 MIL/uL (ref 4.22–5.81)
RDW: 13.5 % (ref 11.5–15.5)
WBC Count: 5.9 10*3/uL (ref 4.0–10.5)
nRBC: 0 % (ref 0.0–0.2)

## 2021-07-08 LAB — TSH: TSH: 0.718 u[IU]/mL (ref 0.350–4.500)

## 2021-07-08 MED ORDER — SODIUM CHLORIDE 0.9 % IV SOLN: Freq: Once | INTRAVENOUS | Status: AC

## 2021-07-08 MED ORDER — SODIUM CHLORIDE 0.9 % IV SOLN
480.0000 mg | Freq: Once | INTRAVENOUS | Status: AC
  Administered 2021-07-08: 480 mg via INTRAVENOUS
  Filled 2021-07-08: qty 48

## 2021-07-08 NOTE — Patient Instructions (Signed)
[facility]  ? Discharge Instructions: ?Thank you for choosing [facility] to provide your oncology and hematology care.  ? ?If you have a lab appointment with the Cancer Center, please go directly to the Cancer Center and check in at the registration area. ?  ?Wear comfortable clothing and clothing appropriate for easy access to any Portacath or PICC line.  ? ?We strive to give you quality time with your provider. You may need to reschedule your appointment if you arrive late (15 or more minutes).  Arriving late affects you and other patients whose appointments are after yours.  Also, if you miss three or more appointments without notifying the office, you may be dismissed from the clinic at the provider?s discretion.    ?  ?For prescription refill requests, have your pharmacy contact our office and allow 72 hours for refills to be completed.   ? ?Today you received the following chemotherapy and/or immunotherapy agents: nivolumab    ?  ?To help prevent nausea and vomiting after your treatment, we encourage you to take your nausea medication as directed. ? ?BELOW ARE SYMPTOMS THAT SHOULD BE REPORTED IMMEDIATELY: ?*FEVER GREATER THAN 100.4 F (38 ?C) OR HIGHER ?*CHILLS OR SWEATING ?*NAUSEA AND VOMITING THAT IS NOT CONTROLLED WITH YOUR NAUSEA MEDICATION ?*UNUSUAL SHORTNESS OF BREATH ?*UNUSUAL BRUISING OR BLEEDING ?*URINARY PROBLEMS (pain or burning when urinating, or frequent urination) ?*BOWEL PROBLEMS (unusual diarrhea, constipation, pain near the anus) ?TENDERNESS IN MOUTH AND THROAT WITH OR WITHOUT PRESENCE OF ULCERS (sore throat, sores in mouth, or a toothache) ?UNUSUAL RASH, SWELLING OR PAIN  ?UNUSUAL VAGINAL DISCHARGE OR ITCHING  ? ?Items with * indicate a potential emergency and should be followed up as soon as possible or go to the Emergency Department if any problems should occur. ? ?Please show the CHEMOTHERAPY ALERT CARD or IMMUNOTHERAPY ALERT CARD at check-in  to the Emergency Department and triage nurse. ? ?Should you have questions after your visit or need to cancel or reschedule your appointment, please contact [facility]  Dept: [phone]  and follow the prompts.  Office hours are 8:00 a.m. to 4:30 p.m. Monday - Friday. Please note that voicemails left after 4:00 p.m. may not be returned until the following business day.  We are closed weekends and major holidays. You have access to a nurse at all times for urgent questions. Please call the main number to the clinic Dept: [phone] and follow the prompts. ? ? ?For any non-urgent questions, you may also contact your provider using MyChart. We now offer e-Visits for anyone 18 and older to request care online for non-urgent symptoms. For details visit [email or web address]. ?  ?Also download the MyChart app! Go to the app store, search "MyChart", open the app, select [facility], and log in with your MyChart username and password. ? ?Due to Covid, a mask is required upon entering the hospital/clinic. If you do not have a mask, one will be given to you upon arrival. For doctor visits, patients may have 1 support person aged 18 or older with them. For treatment visits, patients cannot have anyone with them due to current Covid guidelines and our immunocompromised population.  ? ?

## 2021-07-08 NOTE — Progress Notes (Signed)
?[facility]   ?Telephone:(336) [phone] [phone]   ?Clinic Follow up Note  ? ?Patient Care Team: ?[name], MD as PCP - General (Family Medicine) ?Jerline Pain, MD as PCP - Cardiology (Cardiology) ?Delia Chimes, NP (Inactive) as Nurse Practitioner (Nurse Practitioner) ?Ladene Artist, MD as Consulting Physician (Gastroenterology) ?Alla Feeling, NP as Nurse Practitioner (Nurse Practitioner) ?Truitt Merle, MD as Consulting Physician (Hematology) ?Truitt Merle, MD as Consulting Physician (Oncology) ? ?Date of Service:  07/08/2021 ? ?CHIEF COMPLAINT: f/u of esophageal cancer ? ?CURRENT THERAPY:  ?Nivolumab, starting 03/31/21, currently q4weeks ? ?ASSESSMENT & PLAN:  ?Jeffrey [name] is a 81 y.o. male with  ? ?1. Adenocarcinoma of the GE junction, cTX N1 M0, HER2 negative, MMR normal, PD-L1 CPS 3% ?-He presented with 6-8 months of dysphagia and 10 pounds weight loss and a palpable right cervical lymph node. Initial CT neck 10/22/20 showed 2.8 cm LN, concerning for metastatic disease.  ?-CT 12/08/20 showed suspicious mass at GE junction and prominent gastrohepatic LNs. EGD 12/17/20 by Dr. Fuller Plan showed 5 cm distal esophageal mass, biopsy confirmed adenocarcinoma with signet ring features. HER 2 negative ?-PET scan 12/30/20 showed FDG-avid mass at the GE junction and mild uptake with borderline gastrohepatic ligament lymph nodes, no distant metastasis. ?-He began concurrent chemo RT with weekly carbo/Taxol 01/18/21, C1 carbo was dose reduced to AUC 1.5 for age and co-morbidities. He tolerated moderately well with fatigue, decreased appetite/taste change, nausea, and constipation. ?-cervical lymph node biopsy on 01/29/21 showed atypical cells.  ?-he completed chemo on 12/19 and radiation on 02/26/21. He had slow recovery ?-he began nivolumab on 03/31/21; he tolerated well, no noticeable side effects. We increased to every 4 week regimen starting 06/09/21. ?-restaging PET scan on 06/25/21 showed  positive partial response to therapy.  I personally reviewed his scan images and discussed results with patient and his daughter in detail. ?-labs reviewed, overall improved from prior, adequate to proceed with fifth dose today.  ?  ?2. Goals of care ?-He understands if work-up confirms metastatic disease, it will not be curable but still treatable, treatment will be palliative ?  ?3. Comorbidities: CVA with residual right-sided weakness, history of MI, CAD, HTN, DM, HL ?-Patient lives alone, his daughter checks on him frequently to help with housework and food prep ?-He is limited in his ADLs due to residual right-sided weakness from CVA ?-He ambulates with support, can bathe and dress with difficulty ?  ?  ?PLAN: ?-proceed with cycle 5 Nivo today  ?-lab, f/u, and nivo in 4 and 8 weeks ? ? ?No problem-specific Assessment & Plan notes found for this encounter. ? ? ?SUMMARY OF ONCOLOGIC HISTORY: ?Oncology History Overview Note  ?Cancer Staging ?Esophageal cancer ([place]) ?Staging form: Esophagus - Adenocarcinoma, AJCC 8th Edition ?- Clinical stage from 12/31/2020: Stage IIA (cT1, cN1, cM0, GX) - Unsigned ? ?  ?Esophageal cancer ([place])  ?10/22/2020 Imaging  ? CT neck IMPRESSION: ?2.8 cm right level 2A lymph node, which appears to be centrally ?necrotic, concerning for metastatic disease. No additional ?lymphadenopathy or other acute process in the neck. ?  ?  ?11/30/2020 Imaging  ? Barium swallow FINDINGS: ?Limitations on positioning due to sequelae of stroke. The patient ?swallowed barium without difficulty. No aspiration. There is no mass ?or stricture. Normal motility. There is no hiatal hernia. No ?spontaneous gastroesophageal reflux. Barium pill was swallowed and ?passed without difficulty. ?  ?IMPRESSION: ?No significant abnormality. ?  ?12/08/2020 Imaging  ? CT chest abdomen with contrast  IMPRESSION: ?1. Mass is suspected just at the level of the GE junction. Suspicious for primary esophageal neoplasm. Further  evaluation with endoscopy is advised. ?2. Prominent perigastric lymph nodes are identified, suspicious for metastatic adenopathy. ?3. Gallstone. ?4. Coronary artery calcifications. ?5. Aortic Atherosclerosis (ICD10-I70.0) and Emphysema (ICD10-J43.9). ?6. These results will be called to the ordering clinician or ?representative by the Radiologist Assistant, and communication ?documented in the PACS or [employer]. ?  ?  ?12/17/2020 Procedure  ? EGD by Dr. Fuller Plan ?- A medium-sized, fungating mass with contact bleeding was found in the distal esophagus, ?36 cm from the incisors extending to 41 cm in the cardia. The mass was partially obstructing ?and circumferential. ?  ?12/17/2020 Initial Biopsy  ? Diagnosis ?Esophagus, biopsy, distal and gastric cardia mass ?- ADENOCARCINOMA WITH SIGNET RING FEATURES. ?  ?12/19/2020 Initial Diagnosis  ? Esophageal cancer ([place]) ?  ?12/30/2020 PET scan  ? IMPRESSION: ?1. Mass at the level of the GE junction and proximal stomach is FDG avid within SUV max of 7.62. Findings compatible with primary gastric neoplasm. ?2. There is mild FDG uptake associated with borderline gastrohepatic ligament lymph nodes which have an SUV max of 2.97. Equivocal for nodal metastasis. ?3. No signs of distant metastatic disease. ?4. Aortic Atherosclerosis (ICD10-I70.0) and Emphysema (ICD10-J43.9). Coronary artery calcifications. ?5. Gallstones. ?6. Prostate gland enlargement. ?  ?01/08/2021 Imaging  ? EXAM: ?ULTRASOUND OF HEAD/NECK SOFT TISSUES ? ?IMPRESSION: ?Sonographic evaluation of the right-side of the neck confirms a ?minimally complex cystic lesion with broad differential ?considerations including cystic/necrotic cervical lymphadenopathy ?(favored given presence of an additional smaller apparently ?partially cystic right cervical lymph node), compatible with the ?findings on preceding PET-CT. ?  ?This minimally complex cystic structure/mass could undergo ?ultrasound-guided biopsy/aspiration as  indicated. ?  ?01/18/2021 - 02/22/2021 Chemotherapy  ? Patient is on Treatment Plan : ESOPHAGUS Carboplatin/PACLitaxel weekly x 6 weeks with XRT    ? ?  ?  ?01/29/2021 Pathology Results  ? A. RIGHT, SUBMANDIBULAR CYST, FINE NEEDLE  ?ASPIRATION:  ? ?FINAL MICROSCOPIC DIAGNOSIS:  ?- Atypical cells present ?DIAGNOSTIC COMMENTS:  ?There are clusters of epithelioid cells with nuclear grooves and  ?pseudoinclusions admixed with pigmented histiocytes.  There is  ?insufficient material for ancillary studies.  Excisional biopsy may be  ?of interest. ?  ?03/31/2021 -  Chemotherapy  ? Patient is on Treatment Plan : HEAD/NECK Nivolumab q14d  ? ?  ?  ? ? ? ?INTERVAL HISTORY:  ?ROSHAUN POUND is here for a follow up of esophageal cancer. He was last seen by me on 06/09/21. He presents to the clinic accompanied by his daughter. ?He notes no difference with increase in Nivo. He reports he is fatigued. His daughter notes he only uses the [employer] about once a week. ?  ?All other systems were reviewed with the patient and are negative. ? ?MEDICAL HISTORY:  ?Past Medical History:  ?Diagnosis Date  ? Anterior cerebral circulation hemorrhagic infarction ([place]) 11/24/2014  ? March, 2016, dominant left thalamic and left internal capsule ischemic infarct with resultant hemorrhagic transformation, secondary to small vessel disease. Resulted in right hemiparesis dysarthria and diplopia   //   readmission with aphasia July, 2016, this improved   ? Arthritis   ? CAD (coronary artery disease)   ? PCI distal RCA ...2004, residual 70% LAD   /   ...nuclear...03/2007...no ischemia.Marland KitchenMarland Kitchenpreserved LV /  nuclear...03/03/2009...inferior scar..no ischemia..EF 51%  ? Cyst of nasopharynx   ? per ENT Wilburn Cornelia  ? Dyslipidemia   ? takes  Atorvastatin daily  ? GERD (gastroesophageal reflux disease)   ? takes Omeprazole daily  ? Gout   ? takes Allopurinol daily and Colchicine as needed  ? HCAP (healthcare-associated pneumonia)   ? Hemiparesis affecting right side as  late effect of cerebrovascular accident ([place]) 09/05/2014  ? History of colon polyps   ? HTN (hypertension)   ? takes Cardura,Metoprolol,[name],and Amlodipine daily  ? Internal hemorrhoids   ? Myocardial infarction (

## 2021-07-09 ENCOUNTER — Other Ambulatory Visit: Payer: Self-pay | Admitting: Hematology

## 2021-07-09 DIAGNOSIS — C158 Malignant neoplasm of overlapping sites of esophagus: Secondary | ICD-10-CM

## 2021-07-10 ENCOUNTER — Encounter: Payer: Self-pay | Admitting: Hematology

## 2021-07-10 LAB — T4: T4, Total: 5.6 ug/dL (ref 4.5–12.0)

## 2021-07-12 ENCOUNTER — Telehealth: Payer: Self-pay | Admitting: Family Medicine

## 2021-07-12 DIAGNOSIS — I69351 Hemiplegia and hemiparesis following cerebral infarction affecting right dominant side: Secondary | ICD-10-CM

## 2021-07-12 DIAGNOSIS — C158 Malignant neoplasm of overlapping sites of esophagus: Secondary | ICD-10-CM

## 2021-07-12 DIAGNOSIS — R296 Repeated falls: Secondary | ICD-10-CM

## 2021-07-12 DIAGNOSIS — L89151 Pressure ulcer of sacral region, stage 1: Secondary | ICD-10-CM

## 2021-07-12 DIAGNOSIS — Z7409 Other reduced mobility: Secondary | ICD-10-CM

## 2021-07-12 NOTE — Telephone Encounter (Signed)
Clemmie Krill (Daughter) would like to know the status of this referral below. "We will refer you to home health for physical therapy and to check wounds." "Return in 3 months for follow up visit. Pt has been scheduled for 08/25/2021 for a f/u."  This note was from 05/28/2021. ? ?Callback Number: [phone] ?

## 2021-07-13 ENCOUNTER — Telehealth: Payer: Self-pay | Admitting: Family Medicine

## 2021-07-14 NOTE — Telephone Encounter (Signed)
Still working on placement.  ?[facility] is now working on placing this patient.  ?[place] Rep is going to let me know by Friday 07/16/2021.  ?

## 2021-07-15 ENCOUNTER — Telehealth: Payer: Self-pay | Admitting: Hematology

## 2021-07-15 NOTE — Telephone Encounter (Signed)
Left message with follow-up appointments per 5/4 los. ?

## 2021-07-16 NOTE — Telephone Encounter (Signed)
New URGENT HH referral placed.  ?

## 2021-07-16 NOTE — Telephone Encounter (Signed)
[facility] is not able to accept this patient.  ? ?We are now trying [facility] (another [facility]) ? ?They are needing a new order now for [facility] bc this one has taken too long to try and place its time frame has expired.  ? ?Please place a new URGENT HH order and I will get sent to [facility] to place.  ? ?

## 2021-07-19 NOTE — Telephone Encounter (Addendum)
Plz check with daughter - we're having trouble finding [place] agency for home PT. Would they be willing to go to outpatient physical therapy?  ?Also, how is lower back doing, any open sores or are they all healed? Same for R anterior leg sore.  ?

## 2021-07-19 NOTE — Telephone Encounter (Signed)
Unable to get placed with any HH  ? I have tried and failed with the following [facility] agencies - no one is able to accept him given his insurance.  ? ?[facility] ?[facility] ?[facility] ?[facility] ?[facility] ? ? ?He is currently under review with [facility] to see if they are able to accept him ?

## 2021-07-19 NOTE — Telephone Encounter (Signed)
Pt might have to do OP PT and Wound [facility] if not able to be placed with [facility] ?

## 2021-07-20 NOTE — Telephone Encounter (Signed)
We are not giving up! I am still in communication with the [facility] companies. For now OP services might be the only way to go but IF someone can accept him then they will start care ASAP. ?

## 2021-07-21 NOTE — Telephone Encounter (Signed)
Lvm asking pt's daughter, [name] (on dpr).  Need to relay Dr. Synthia Innocent message and get answers to his questions.  ?

## 2021-07-22 NOTE — Telephone Encounter (Signed)
Jeffrey [name] (Daughter) called about missing a call from [place], she was returning the call about her dad. Please return the call when possible.   Callback Number: [phone]

## 2021-07-22 NOTE — Telephone Encounter (Signed)
Spoke pt's daughter, [name], relaying Dr. Synthia Innocent message.  Verbalizes understanding and agrees to outpt PT.  Prefers [place].  Says leg sore is healing well.  States pt hasn't complained about lower back or any other sores.

## 2021-07-23 NOTE — Telephone Encounter (Signed)
Referral sent. HH referral cancelled at this time.

## 2021-07-23 NOTE — Telephone Encounter (Addendum)
Outpatient PT is not ideal given fall risk and impaired mobility however is only option as [place] agencies won't accept patient. Outpatient PT referral placed. Will cc [facility] as FYI.

## 2021-07-23 NOTE — Addendum Note (Signed)
Addended by: [name] on: 07/23/2021 07:40 AM   Modules accepted: Orders

## 2021-08-03 ENCOUNTER — Encounter: Payer: Self-pay | Admitting: Hematology

## 2021-08-03 ENCOUNTER — Telehealth: Payer: Self-pay

## 2021-08-03 ENCOUNTER — Other Ambulatory Visit: Payer: Self-pay

## 2021-08-03 ENCOUNTER — Other Ambulatory Visit (HOSPITAL_COMMUNITY): Payer: Self-pay

## 2021-08-03 MED ORDER — NYSTATIN 100000 UNIT/ML MT SUSP
5.0000 mL | Freq: Four times a day (QID) | OROMUCOSAL | 1 refills | Status: DC | PRN
  Filled 2021-08-03: qty 450, 14d supply, fill #0

## 2021-08-03 NOTE — Telephone Encounter (Signed)
See previous telephone conversation note.

## 2021-08-03 NOTE — Telephone Encounter (Signed)
Pt daughter called stating the pt has completed chemo and radiation to his throat.  Pt's daughter stated that the pt is having some trouble keeping his food down.  Asked pt's daughter if the pt has antinausea medications.  Pt's daughter stated "Yes" the pt has Zofran and Compazine but has only taken the 1 compazine which she stated he "threw up".  Instructed pt to give the pt Zofran to help with the nausea & vomiting.  Instructed pt to dissolve Zofran and Compazine in 37m of water and have pt to drink the water.  Pt's daughter stated she will try this method.  Pt is scheduled to come in on Friday for infusion and see IMurray Hodgkins PA-C prior to infusion.  Notified IMurray Hodgkinsof pt's daughter's call today.

## 2021-08-04 ENCOUNTER — Other Ambulatory Visit (HOSPITAL_COMMUNITY): Payer: Self-pay

## 2021-08-05 ENCOUNTER — Other Ambulatory Visit: Payer: Self-pay | Admitting: Family Medicine

## 2021-08-06 ENCOUNTER — Other Ambulatory Visit: Payer: Self-pay

## 2021-08-06 ENCOUNTER — Other Ambulatory Visit (HOSPITAL_COMMUNITY): Payer: Self-pay

## 2021-08-06 ENCOUNTER — Inpatient Hospital Stay: Payer: PPO

## 2021-08-06 ENCOUNTER — Inpatient Hospital Stay: Payer: PPO | Attending: Nurse Practitioner

## 2021-08-06 ENCOUNTER — Inpatient Hospital Stay (HOSPITAL_BASED_OUTPATIENT_CLINIC_OR_DEPARTMENT_OTHER): Payer: PPO | Admitting: Physician Assistant

## 2021-08-06 VITALS — BP 157/84 | HR 60 | Temp 97.7°F | Resp 15 | Wt 155.5 lb

## 2021-08-06 DIAGNOSIS — K117 Disturbances of salivary secretion: Secondary | ICD-10-CM | POA: Insufficient documentation

## 2021-08-06 DIAGNOSIS — C158 Malignant neoplasm of overlapping sites of esophagus: Secondary | ICD-10-CM | POA: Diagnosis not present

## 2021-08-06 DIAGNOSIS — Z5112 Encounter for antineoplastic immunotherapy: Secondary | ICD-10-CM | POA: Insufficient documentation

## 2021-08-06 DIAGNOSIS — C160 Malignant neoplasm of cardia: Secondary | ICD-10-CM | POA: Insufficient documentation

## 2021-08-06 DIAGNOSIS — Z8673 Personal history of transient ischemic attack (TIA), and cerebral infarction without residual deficits: Secondary | ICD-10-CM | POA: Insufficient documentation

## 2021-08-06 DIAGNOSIS — I2510 Atherosclerotic heart disease of native coronary artery without angina pectoris: Secondary | ICD-10-CM | POA: Diagnosis not present

## 2021-08-06 DIAGNOSIS — E1140 Type 2 diabetes mellitus with diabetic neuropathy, unspecified: Secondary | ICD-10-CM | POA: Diagnosis not present

## 2021-08-06 DIAGNOSIS — C155 Malignant neoplasm of lower third of esophagus: Secondary | ICD-10-CM

## 2021-08-06 DIAGNOSIS — R197 Diarrhea, unspecified: Secondary | ICD-10-CM | POA: Diagnosis not present

## 2021-08-06 DIAGNOSIS — Z79899 Other long term (current) drug therapy: Secondary | ICD-10-CM | POA: Insufficient documentation

## 2021-08-06 DIAGNOSIS — E1165 Type 2 diabetes mellitus with hyperglycemia: Secondary | ICD-10-CM | POA: Insufficient documentation

## 2021-08-06 DIAGNOSIS — I10 Essential (primary) hypertension: Secondary | ICD-10-CM | POA: Insufficient documentation

## 2021-08-06 DIAGNOSIS — R1310 Dysphagia, unspecified: Secondary | ICD-10-CM | POA: Diagnosis not present

## 2021-08-06 LAB — CMP (CANCER CENTER ONLY)
ALT: 19 U/L (ref 0–44)
AST: 16 U/L (ref 15–41)
Albumin: 3.8 g/dL (ref 3.5–5.0)
Alkaline Phosphatase: 109 U/L (ref 38–126)
Anion gap: 5 (ref 5–15)
BUN: 24 mg/dL — ABNORMAL HIGH (ref 8–23)
CO2: 27 mmol/L (ref 22–32)
Calcium: 9.3 mg/dL (ref 8.9–10.3)
Chloride: 106 mmol/L (ref 98–111)
Creatinine: 0.96 mg/dL (ref 0.61–1.24)
GFR, Estimated: >60 mL/min (ref >60)
Glucose, Bld: 293 mg/dL — ABNORMAL HIGH (ref 70–99)
Potassium: 3.5 mmol/L (ref 3.5–5.1)
Sodium: 138 mmol/L (ref 135–145)
Total Bilirubin: 0.7 mg/dL (ref 0.3–1.2)
Total Protein: 6.6 g/dL (ref 6.5–8.1)

## 2021-08-06 LAB — CBC WITH DIFFERENTIAL (CANCER CENTER ONLY)
Abs Immature Granulocytes: 0.01 10*3/uL (ref 0.00–0.07)
Basophils Absolute: 0 10*3/uL (ref 0.0–0.1)
Basophils Relative: 1 %
Eosinophils Absolute: 0.1 10*3/uL (ref 0.0–0.5)
Eosinophils Relative: 2 %
HCT: 38.3 % — ABNORMAL LOW (ref 39.0–52.0)
Hemoglobin: 12.8 g/dL — ABNORMAL LOW (ref 13.0–17.0)
Immature Granulocytes: 0 %
Lymphocytes Relative: 14 %
Lymphs Abs: 0.8 10*3/uL (ref 0.7–4.0)
MCH: 30.2 pg (ref 26.0–34.0)
MCHC: 33.4 g/dL (ref 30.0–36.0)
MCV: 90.3 fL (ref 80.0–100.0)
Monocytes Absolute: 0.4 10*3/uL (ref 0.1–1.0)
Monocytes Relative: 8 %
Neutro Abs: 4.2 10*3/uL (ref 1.7–7.7)
Neutrophils Relative %: 75 %
Platelet Count: 145 10*3/uL — ABNORMAL LOW (ref 150–400)
RBC: 4.24 MIL/uL (ref 4.22–5.81)
RDW: 14.2 % (ref 11.5–15.5)
WBC Count: 5.6 10*3/uL (ref 4.0–10.5)
nRBC: 0 % (ref 0.0–0.2)

## 2021-08-06 LAB — CEA (IN HOUSE-CHCC): CEA (CHCC-In House): 3.15 ng/mL (ref 0.00–5.00)

## 2021-08-06 LAB — TSH: TSH: 0.689 u[IU]/mL (ref 0.350–4.500)

## 2021-08-06 MED ORDER — SODIUM CHLORIDE 0.9 % IV SOLN: Freq: Once | INTRAVENOUS | Status: AC

## 2021-08-06 MED ORDER — SODIUM CHLORIDE 0.9 % IV SOLN
480.0000 mg | Freq: Once | INTRAVENOUS | Status: AC
  Administered 2021-08-06: 480 mg via INTRAVENOUS
  Filled 2021-08-06: qty 48

## 2021-08-06 NOTE — Telephone Encounter (Signed)
See 03/04/21 OV notes.

## 2021-08-06 NOTE — Patient Instructions (Addendum)
[facility]  Discharge Instructions: °Thank you for choosing [facility] to provide your oncology and hematology care.  ° °If you have a lab appointment with the Cancer Center, please go directly to the Cancer Center and check in at the registration area. °  °Wear comfortable clothing and clothing appropriate for easy access to any Portacath or PICC line.  ° °We strive to give you quality time with your provider. You may need to reschedule your appointment if you arrive late (15 or more minutes).  Arriving late affects you and other patients whose appointments are after yours.  Also, if you miss three or more appointments without notifying the office, you may be dismissed from the clinic at the provider’s discretion.    °  °For prescription refill requests, have your pharmacy contact our office and allow 72 hours for refills to be completed.   ° °Today you received the following chemotherapy and/or immunotherapy agent: Nivolumab ([place]) °  °To help prevent nausea and vomiting after your treatment, we encourage you to take your nausea medication as directed. ° °BELOW ARE SYMPTOMS THAT SHOULD BE REPORTED IMMEDIATELY: °*FEVER GREATER THAN 100.4 F (38 °C) OR HIGHER °*CHILLS OR SWEATING °*NAUSEA AND VOMITING THAT IS NOT CONTROLLED WITH YOUR NAUSEA MEDICATION °*UNUSUAL SHORTNESS OF BREATH °*UNUSUAL BRUISING OR BLEEDING °*URINARY PROBLEMS (pain or burning when urinating, or frequent urination) °*BOWEL PROBLEMS (unusual diarrhea, constipation, pain near the anus) °TENDERNESS IN MOUTH AND THROAT WITH OR WITHOUT PRESENCE OF ULCERS (sore throat, sores in mouth, or a toothache) °UNUSUAL RASH, SWELLING OR PAIN  °UNUSUAL VAGINAL DISCHARGE OR ITCHING  ° °Items with * indicate a potential emergency and should be followed up as soon as possible or go to the Emergency Department if any problems should occur. ° °Please show the CHEMOTHERAPY ALERT CARD or IMMUNOTHERAPY ALERT CARD at  check-in to the Emergency Department and triage nurse. ° °Should you have questions after your visit or need to cancel or reschedule your appointment, please contact [facility]  Dept: [phone]  and follow the prompts.  Office hours are 8:00 a.m. to 4:30 p.m. Monday - Friday. Please note that voicemails left after 4:00 p.m. may not be returned until the following business day.  We are closed weekends and major holidays. You have access to a nurse at all times for urgent questions. Please call the main number to the clinic Dept: [phone] and follow the prompts. ° ° °For any non-urgent questions, you may also contact your provider using MyChart. We now offer e-Visits for anyone 18 and older to request care online for non-urgent symptoms. For details visit [email or web address]. °  °Also download the MyChart app! Go to the app store, search "MyChart", open the app, select [facility], and log in with your MyChart username and password. ° °Due to Covid, a mask is required upon entering the hospital/clinic. If you do not have a mask, one will be given to you upon arrival. For doctor visits, patients may have 1 support person aged 18 or older with them. For treatment visits, patients cannot have anyone with them due to current Covid guidelines and our immunocompromised population.  ° °

## 2021-08-07 LAB — T4: T4, Total: 5 ug/dL (ref 4.5–12.0)

## 2021-08-09 ENCOUNTER — Encounter: Payer: Self-pay | Admitting: Hematology

## 2021-08-09 NOTE — Progress Notes (Signed)
[place]   [phone] Fax:([phone]) [phone]   Clinic Follow up Note   Patient Care Team: [name], MD as PCP - General (Family Medicine) Jerline Pain, MD as PCP - Cardiology (Cardiology) Delia Chimes, NP (Inactive) as Nurse Practitioner (Nurse Practitioner) Ladene Artist, MD as Consulting Physician (Gastroenterology) Alla Feeling, NP as Nurse Practitioner (Nurse Practitioner) Truitt Merle, MD as Consulting Physician (Hematology) Truitt Merle, MD as Consulting Physician (Oncology)  Date of Service:  08/09/2021  CHIEF COMPLAINT: f/u of esophageal cancer  CURRENT THERAPY:  Nivolumab, starting 03/31/21, currently q4weeks  SUMMARY OF ONCOLOGIC HISTORY: Oncology History Overview Note  Cancer Staging Esophageal cancer [facility]) Staging form: Esophagus - Adenocarcinoma, AJCC 8th Edition - Clinical stage from 12/31/2020: Stage IIA (cT1, cN1, cM0, GX) - Unsigned    Esophageal cancer ([place])  10/22/2020 Imaging   CT neck IMPRESSION: 2.8 cm right level 2A lymph node, which appears to be centrally necrotic, concerning for metastatic disease. No additional lymphadenopathy or other acute process in the neck.     11/30/2020 Imaging   Barium swallow FINDINGS: Limitations on positioning due to sequelae of stroke. The patient swallowed barium without difficulty. No aspiration. There is no mass or stricture. Normal motility. There is no hiatal hernia. No spontaneous gastroesophageal reflux. Barium pill was swallowed and passed without difficulty.   IMPRESSION: No significant abnormality.   12/08/2020 Imaging   CT chest abdomen with contrast IMPRESSION: 1. Mass is suspected just at the level of the GE junction. Suspicious for primary esophageal neoplasm. Further evaluation with endoscopy is advised. 2. Prominent perigastric lymph nodes are identified, suspicious for metastatic adenopathy. 3. Gallstone. 4. Coronary artery calcifications. 5. Aortic  Atherosclerosis (ICD10-I70.0) and Emphysema (ICD10-J43.9). 6. These results will be called to the ordering clinician or representative by the Radiologist Assistant, and communication documented in the PACS or [employer].     12/17/2020 Procedure   EGD by Dr. Fuller Plan - A medium-sized, fungating mass with contact bleeding was found in the distal esophagus, 36 cm from the incisors extending to 41 cm in the cardia. The mass was partially obstructing and circumferential.   12/17/2020 Initial Biopsy   Diagnosis Esophagus, biopsy, distal and gastric cardia mass - ADENOCARCINOMA WITH SIGNET RING FEATURES.   12/19/2020 Initial Diagnosis   Esophageal cancer ([place])   12/30/2020 PET scan   IMPRESSION: 1. Mass at the level of the GE junction and proximal stomach is FDG avid within SUV max of 7.62. Findings compatible with primary gastric neoplasm. 2. There is mild FDG uptake associated with borderline gastrohepatic ligament lymph nodes which have an SUV max of 2.97. Equivocal for nodal metastasis. 3. No signs of distant metastatic disease. 4. Aortic Atherosclerosis (ICD10-I70.0) and Emphysema (ICD10-J43.9). Coronary artery calcifications. 5. Gallstones. 6. Prostate gland enlargement.   01/08/2021 Imaging   EXAM: ULTRASOUND OF HEAD/NECK SOFT TISSUES  IMPRESSION: Sonographic evaluation of the right-side of the neck confirms a minimally complex cystic lesion with broad differential considerations including cystic/necrotic cervical lymphadenopathy (favored given presence of an additional smaller apparently partially cystic right cervical lymph node), compatible with the findings on preceding PET-CT.   This minimally complex cystic structure/mass could undergo ultrasound-guided biopsy/aspiration as indicated.   01/18/2021 - 02/22/2021 Chemotherapy   Patient is on Treatment Plan : ESOPHAGUS Carboplatin/PACLitaxel weekly x 6 weeks with XRT        01/29/2021 Pathology Results   A.  RIGHT, SUBMANDIBULAR CYST, FINE NEEDLE  ASPIRATION:   FINAL MICROSCOPIC DIAGNOSIS:  - Atypical cells  present DIAGNOSTIC COMMENTS:  There are clusters of epithelioid cells with nuclear grooves and  pseudoinclusions admixed with pigmented histiocytes.  There is  insufficient material for ancillary studies.  Excisional biopsy may be  of interest.   03/31/2021 -  Chemotherapy   Patient is on Treatment Plan : HEAD/NECK Nivolumab q14d         INTERVAL HISTORY:  Jeffrey [name] is here for a follow up of esophageal cancer. He was last seen by Dr. Burr Medico on 07/08/21. He presents to the clinic accompanied by his daughter.  [name] reports having two episodes of regurgitation of solid foods such as steak and chicken this past week. He denies blood-tinged sputum and odynophagia. He tolerates soft food and liquids well. He is positive for fatigue, most often after his infusions. He has some diarrhea after treatment and takes half of an Imodium for his diarrhea daily with improvement of symptoms. He takes Zofran 30-40 minutes before meals which helps with his nausea. His fasting glucose was elevated today at "264," but endorses taking his glucose every morning. He is scheduled to see his Diabetes specialist next Tuesday. He is negative for frequent infections, fever, chills, weight loss, SOB, chest pain, abdominal pain, and odynophagia. He has no other complaints.   All other systems were reviewed with the patient and are negative.  MEDICAL HISTORY:  Past Medical History:  Diagnosis Date   Anterior cerebral circulation hemorrhagic infarction [facility]) 11/24/2014   March, 2016, dominant left thalamic and left internal capsule ischemic infarct with resultant hemorrhagic transformation, secondary to small vessel disease. Resulted in right hemiparesis dysarthria and diplopia   //   readmission with aphasia July, 2016, this improved    Arthritis    CAD (coronary artery disease)    PCI distal RCA ...2004,  residual 70% LAD   /   ...nuclear...03/2007...no ischemia.Marland KitchenMarland Kitchenpreserved LV /  nuclear...03/03/2009...inferior scar..no ischemia..EF 51%   Cyst of nasopharynx    per ENT Wilburn Cornelia   Dyslipidemia    takes Atorvastatin daily   GERD (gastroesophageal reflux disease)    takes Omeprazole daily   Gout    takes Allopurinol daily and Colchicine as needed   HCAP (healthcare-associated pneumonia)    Hemiparesis affecting right side as late effect of cerebrovascular accident ([place]) 09/05/2014   History of colon polyps    HTN (hypertension)    takes Cardura,Metoprolol,[name],and Amlodipine daily   Internal hemorrhoids    Myocardial infarction [facility]) 36yr ago   Peripheral edema    takes Lasix daily   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    SURGICAL HISTORY: Past Surgical History:  Procedure Laterality Date   BALLOON ANGIOPLASTY, ARTERY  1992, 2004   CAD, Dr. KRon Parker  CARDIAC CATHETERIZATION  2004   CATARACT EXTRACTION Bilateral 2010   COLONOSCOPY  04/03/2002   adenomatous polyp, int hemorrhoids   COLONOSCOPY  07/18/2007   normal (Dr. SFuller Plan   COLONOSCOPY  09/26/2012   tubular adenoma, sm int hem, rpt 5 yrs (Fuller Plan   ELBOW SURGERY Right 1997   eye lids raised     NASAL SEPTUM SURGERY     POLYPECTOMY N/A 05/27/2013   Procedure: ENDOSCOPIC NASOPHARYNGEAL MASS;  Surgeon: DJerrell Belfast MD   ROTATOR CUFF REPAIR  09/2011   left, with subacromial decompression   TONSILLECTOMY AND ADENOIDECTOMY      I have reviewed the social history and family history with the patient and they are unchanged from previous note.  ALLERGIES:  is allergic to niacin.  MEDICATIONS:  Current Outpatient Medications  Medication Sig Dispense Refill   atorvastatin (LIPITOR) 80 MG tablet Take  80 mg by mouth at bedtime.     baclofen (LIORESAL) 10 MG tablet Take 10 mg by mouth 3 (three) times daily.     cholecalciferol (VITAMIN D3) 25 MCG (1000 UNIT) tablet Take 1,000 Units by mouth daily.     clopidogrel (PLAVIX) 75 MG tablet Take 1 tablet (75 mg total) by mouth daily.     empagliflozin (JARDIANCE) 25 MG TABS tablet Take 25 mg by mouth daily.     fosinopril ([name]) 40 MG tablet Take 1 tablet (40 mg total) by mouth daily.     furosemide (LASIX) 20 MG tablet Take 20-40 mg by mouth See admin instructions. Take 20 mg on Mon, Wed, Fri, and Sun. Take 40 mg on Tues, Thurs, and Sat     insulin aspart (NOVOLOG FLEXPEN) 100 UNIT/ML FlexPen Inject 10 Units into the skin 2 (two) times daily with a meal. Breakfast and dinner     insulin glargine (LANTUS) 100 UNIT/ML Solostar Pen Inject 20 Units into the skin at bedtime.     LACTOBACILLUS PO Take 1 capsule by mouth every Monday, Wednesday, and Friday.     loratadine (CLARITIN) 10 MG tablet Take 10 mg by mouth.     megestrol (MEGACE ES) 625 MG/5ML suspension TAKE 5 MLS (625 MG TOTAL) BY MOUTH DAILY. 450 mL 1   metoprolol tartrate (LOPRESSOR) 25 MG tablet Take 1 tablet (25 mg total) by mouth 2 (two) times daily.     mirabegron ER (MYRBETRIQ) 25 MG TB24 tablet Take 1 tablet (25 mg total) by mouth daily. 30 tablet 3   Olopatadine HCl (PATADAY OP) Place 1 drop into both eyes daily as needed (allergies).     pantoprazole (PROTONIX) 20 MG tablet TAKE 1 TABLET BY MOUTH EVERY DAY 90 tablet 0   Potassium 99 MG TABS Take 1 tablet (99 mg total) by mouth daily as needed (when you take 2 lasix pills).     sertraline (ZOLOFT) 25 MG tablet Take 1 tablet (25 mg total) by mouth daily.     silver sulfADIAZINE (SILVADENE) 1 % cream Apply 1 application topically daily. To sore on buttocks daily 50 g 0   magic mouthwash (nystatin, diphenhydrAMINE, alum & mag hydroxide) suspension mixture Swish and swallow 5 mLs by mouth 4 (four) times daily as needed for mouth pain.  (Patient not taking: Reported on 08/06/2021) 450 mL 1   No current facility-administered medications for this visit.    PHYSICAL EXAMINATION: ECOG PERFORMANCE STATUS: 1 - Symptomatic but completely ambulatory  Vitals:   08/06/21 1037  BP: (!) 157/84  Pulse: 60  Resp: 15  Temp: 97.7 F (36.5 C)  SpO2: 100%   Wt Readings from Last 3 Encounters:  08/06/21 155 lb 8 oz (70.5 kg)  07/08/21 153 lb 4.8 oz (69.5 kg)  06/09/21 150 lb 14.4 oz (68.4 kg)     GENERAL:alert, no distress and comfortable SKIN: skin color normal, no rashes or significant lesions EYES: normal, Conjunctiva are pink and non-injected, sclera clear  NEURO: alert & oriented x 3 with fluent speech  LABORATORY DATA:  I have reviewed the data as listed  Latest Ref Rng & Units 08/06/2021    9:34 AM 07/08/2021   10:51 AM 06/09/2021   10:59 AM  CBC  WBC 4.0 - 10.5 K/uL 5.6   5.9   7.6    Hemoglobin 13.0 - 17.0 g/dL 12.8   13.7   12.1    Hematocrit 39.0 - 52.0 % 38.3   38.9   35.6    Platelets 150 - 400 K/uL 145   142   140          Latest Ref Rng & Units 08/06/2021    9:34 AM 07/08/2021   10:51 AM 06/09/2021   10:59 AM  CMP  Glucose 70 - 99 mg/dL 293   120   326    BUN 8 - 23 mg/dL _0 Creatinine 0.61 - 1.24 mg/dL 0.96   0.87   0.76    Sodium 135 - 145 mmol/L 138   140   137    Potassium 3.5 - 5.1 mmol/L 3.5   3.3   3.5    Chloride 98 - 111 mmol/L 106   109   105    CO2 22 - 32 mmol/L _1 Calcium 8.9 - 10.3 mg/dL 9.3   9.0   8.8    Total Protein 6.5 - 8.1 g/dL 6.6   6.7   6.2    Total Bilirubin 0.3 - 1.2 mg/dL 0.7   0.7   0.4    Alkaline Phos 38 - 126 U/L 109   99   96    AST 15 - 41 U/L _2 ALT 0 - 44 U/L _3 RADIOGRAPHIC STUDIES: I have personally reviewed the radiological images as listed and agreed with the findings in the report. No results found.   ASSESSMENT & PLAN:  Jeffrey [name] is a 81 y.o. male with   1. Adenocarcinoma of the GE junction,  cTX N1 M0, HER2 negative, MMR normal, PD-L1 CPS 3% -He presented with 6-8 months of dysphagia and 10 pounds weight loss and a palpable right cervical lymph node. Initial CT neck 10/22/20 showed 2.8 cm LN, concerning for metastatic disease.  -CT 12/08/20 showed suspicious mass at GE junction and prominent gastrohepatic LNs. EGD 12/17/20 by Dr. Fuller Plan showed 5 cm distal esophageal mass, biopsy confirmed adenocarcinoma with signet ring features. HER 2 negative -PET scan 12/30/20 showed FDG-avid mass at the GE junction and mild uptake with borderline gastrohepatic ligament lymph nodes, no distant metastasis. -He began concurrent chemo RT with weekly carbo/Taxol 01/18/21, C1 carbo was dose reduced to AUC 1.5 for age and co-morbidities. He tolerated moderately well with fatigue, decreased appetite/taste change, nausea, and constipation. -cervical lymph node biopsy on 01/29/21 showed atypical cells.  -he completed chemo on 12/19 and radiation on 02/26/21. He had slow recovery -he began nivolumab on 03/31/21; he tolerated well, no noticeable side effects. We increased to every 4 week regimen starting 06/09/21. -restaging PET scan on 06/25/21 showed positive partial response to therapy.  I personally reviewed his scan images and discussed results with patient and his daughter in detail.  2. Goals of care -He understands if work-up confirms metastatic disease, it will not be curable but still treatable, treatment will be palliative   3. Comorbidities: CVA with residual right-sided weakness, history of MI,  CAD, HTN, DM, HL -Patient lives alone, his daughter checks on him frequently to help with housework and food prep -He is limited in his ADLs due to residual right-sided weakness from CVA -He ambulates with support, can bathe and dress with difficulty   4. Dysphagia: --Two episodes of solids (meats), last week.  --Advised to closely monitor and follow up if symptoms worsen.  --Need to consider repeat scan if  dysphagia worsens.   5. Hyperglycemia: --Glucose was 293 today --Patient is under the care of diabetes specialist.  --Advised to monitor blood glucose levels at home daily and follow up with PCP/endocrinologist.    PLAN: -Labs from today were reviewed and adequate for treatment. WBC 5.6, Hgb 12.8, Plt 145.  -Proceed with Cycle 7 of Nivo today -Monitor closely for dysphagia and follow up if symptoms worsen -F/U with diabetes specialist regarding blood glucose levels -RTC on 09/02/2021 for labs, f/u visit with Dr. Burr Medico prior to Cycle 8 of Nivo.    All questions were answered. The patient knows to call the clinic with any problems, questions or concerns. No barriers to learning was detected.  I have spent a total of 30 minutes minutes of face-to-face and non-face-to-face time, preparing to see the patient, performing a medically appropriate examination, counseling and educating the patient, ordering medications/tests, documenting clinical information in the electronic health record, and care coordination.   Dede Query PA-C Dept of Hematology and [place] at [facility] Phone: [phone]

## 2021-08-10 DIAGNOSIS — R609 Edema, unspecified: Secondary | ICD-10-CM | POA: Diagnosis not present

## 2021-08-10 DIAGNOSIS — E785 Hyperlipidemia, unspecified: Secondary | ICD-10-CM | POA: Diagnosis not present

## 2021-08-10 DIAGNOSIS — Z794 Long term (current) use of insulin: Secondary | ICD-10-CM | POA: Diagnosis not present

## 2021-08-10 DIAGNOSIS — E113293 Type 2 diabetes mellitus with mild nonproliferative diabetic retinopathy without macular edema, bilateral: Secondary | ICD-10-CM | POA: Diagnosis not present

## 2021-08-10 DIAGNOSIS — B3742 Candidal balanitis: Secondary | ICD-10-CM | POA: Diagnosis not present

## 2021-08-10 DIAGNOSIS — Z8673 Personal history of transient ischemic attack (TIA), and cerebral infarction without residual deficits: Secondary | ICD-10-CM | POA: Diagnosis not present

## 2021-08-10 DIAGNOSIS — K5900 Constipation, unspecified: Secondary | ICD-10-CM | POA: Diagnosis not present

## 2021-08-10 DIAGNOSIS — E1142 Type 2 diabetes mellitus with diabetic polyneuropathy: Secondary | ICD-10-CM | POA: Diagnosis not present

## 2021-08-10 DIAGNOSIS — I10 Essential (primary) hypertension: Secondary | ICD-10-CM | POA: Diagnosis not present

## 2021-08-25 ENCOUNTER — Ambulatory Visit: Payer: PPO | Admitting: Family Medicine

## 2021-08-27 ENCOUNTER — Other Ambulatory Visit (HOSPITAL_COMMUNITY): Payer: Self-pay

## 2021-08-27 ENCOUNTER — Encounter: Payer: Self-pay | Admitting: Podiatry

## 2021-08-27 ENCOUNTER — Ambulatory Visit (INDEPENDENT_AMBULATORY_CARE_PROVIDER_SITE_OTHER): Payer: PPO | Admitting: Podiatry

## 2021-08-27 ENCOUNTER — Telehealth: Payer: Self-pay

## 2021-08-27 DIAGNOSIS — B351 Tinea unguium: Secondary | ICD-10-CM | POA: Diagnosis not present

## 2021-08-27 DIAGNOSIS — E1142 Type 2 diabetes mellitus with diabetic polyneuropathy: Secondary | ICD-10-CM

## 2021-08-27 DIAGNOSIS — M79674 Pain in right toe(s): Secondary | ICD-10-CM | POA: Diagnosis not present

## 2021-08-27 DIAGNOSIS — M79675 Pain in left toe(s): Secondary | ICD-10-CM

## 2021-08-27 DIAGNOSIS — N3281 Overactive bladder: Secondary | ICD-10-CM | POA: Insufficient documentation

## 2021-08-27 DIAGNOSIS — I69351 Hemiplegia and hemiparesis following cerebral infarction affecting right dominant side: Secondary | ICD-10-CM

## 2021-08-27 DIAGNOSIS — E119 Type 2 diabetes mellitus without complications: Secondary | ICD-10-CM | POA: Diagnosis not present

## 2021-08-27 NOTE — Telephone Encounter (Signed)
Pt's daughter LVM stating that when the pt was receiving radiation the pt was told he cannot take Ibuprofen.  Pt's daughter wants to know that now since the pt is no longer receiving radiation can the pt restart Ibuprofen for his pain?  Sent pt a MyChart message asking if they had contacted the pt's radiation provider.  Awaiting their response.

## 2021-08-30 ENCOUNTER — Ambulatory Visit (INDEPENDENT_AMBULATORY_CARE_PROVIDER_SITE_OTHER): Payer: PPO | Admitting: Family Medicine

## 2021-08-30 ENCOUNTER — Encounter: Payer: Self-pay | Admitting: Family Medicine

## 2021-08-30 VITALS — BP 138/80 | HR 71 | Temp 98.2°F | Ht 68.0 in | Wt 148.4 lb

## 2021-08-30 DIAGNOSIS — C158 Malignant neoplasm of overlapping sites of esophagus: Secondary | ICD-10-CM | POA: Diagnosis not present

## 2021-08-30 DIAGNOSIS — I69351 Hemiplegia and hemiparesis following cerebral infarction affecting right dominant side: Secondary | ICD-10-CM

## 2021-08-30 DIAGNOSIS — M24541 Contracture, right hand: Secondary | ICD-10-CM | POA: Diagnosis not present

## 2021-08-30 DIAGNOSIS — R1319 Other dysphagia: Secondary | ICD-10-CM

## 2021-08-30 DIAGNOSIS — R296 Repeated falls: Secondary | ICD-10-CM | POA: Diagnosis not present

## 2021-08-30 DIAGNOSIS — E1149 Type 2 diabetes mellitus with other diabetic neurological complication: Secondary | ICD-10-CM

## 2021-08-30 DIAGNOSIS — Z7409 Other reduced mobility: Secondary | ICD-10-CM

## 2021-08-30 LAB — POCT GLYCOSYLATED HEMOGLOBIN (HGB A1C): Hemoglobin A1C: 8.1 % — AB (ref 4.0–5.6)

## 2021-08-30 NOTE — Assessment & Plan Note (Signed)
A1c deteriorated. Diabetes is followed by endo.

## 2021-08-30 NOTE — Assessment & Plan Note (Signed)
Appreciate oncology care, now on monthly maintenance chemotherapy.

## 2021-08-30 NOTE — Assessment & Plan Note (Signed)
Ongoing difficulty associated with coughing fits.  rec discuss with oncology, consider ST/swallow study.

## 2021-08-30 NOTE — Assessment & Plan Note (Addendum)
Difficulty finding home health agency for patient. Will retry.  Concern for developing hand contracture - will see if [facility] OT can further assess, recommend hand brace.

## 2021-09-01 ENCOUNTER — Encounter: Payer: Self-pay | Admitting: Family Medicine

## 2021-09-02 ENCOUNTER — Telehealth: Payer: Self-pay

## 2021-09-02 ENCOUNTER — Other Ambulatory Visit: Payer: Self-pay

## 2021-09-02 ENCOUNTER — Inpatient Hospital Stay: Payer: PPO

## 2021-09-02 ENCOUNTER — Inpatient Hospital Stay (HOSPITAL_BASED_OUTPATIENT_CLINIC_OR_DEPARTMENT_OTHER): Payer: PPO | Admitting: Hematology

## 2021-09-02 VITALS — BP 159/78 | HR 64 | Temp 97.8°F | Resp 18 | Ht 68.0 in | Wt 149.5 lb

## 2021-09-02 DIAGNOSIS — C158 Malignant neoplasm of overlapping sites of esophagus: Secondary | ICD-10-CM

## 2021-09-02 DIAGNOSIS — R1310 Dysphagia, unspecified: Secondary | ICD-10-CM

## 2021-09-02 DIAGNOSIS — Z5112 Encounter for antineoplastic immunotherapy: Secondary | ICD-10-CM | POA: Diagnosis not present

## 2021-09-02 DIAGNOSIS — K2289 Other specified disease of esophagus: Secondary | ICD-10-CM

## 2021-09-02 LAB — CBC WITH DIFFERENTIAL/PLATELET
Abs Immature Granulocytes: 0.02 10*3/uL (ref 0.00–0.07)
Basophils Absolute: 0 10*3/uL (ref 0.0–0.1)
Basophils Relative: 1 %
Eosinophils Absolute: 0.1 10*3/uL (ref 0.0–0.5)
Eosinophils Relative: 1 %
HCT: 39.5 % (ref 39.0–52.0)
Hemoglobin: 13.2 g/dL (ref 13.0–17.0)
Immature Granulocytes: 0 %
Lymphocytes Relative: 11 %
Lymphs Abs: 0.7 10*3/uL (ref 0.7–4.0)
MCH: 30.1 pg (ref 26.0–34.0)
MCHC: 33.4 g/dL (ref 30.0–36.0)
MCV: 90 fL (ref 80.0–100.0)
Monocytes Absolute: 0.5 10*3/uL (ref 0.1–1.0)
Monocytes Relative: 8 %
Neutro Abs: 5 10*3/uL (ref 1.7–7.7)
Neutrophils Relative %: 79 %
Platelets: 172 10*3/uL (ref 150–400)
RBC: 4.39 MIL/uL (ref 4.22–5.81)
RDW: 13.2 % (ref 11.5–15.5)
WBC: 6.3 10*3/uL (ref 4.0–10.5)
nRBC: 0 % (ref 0.0–0.2)

## 2021-09-02 LAB — T4, FREE: Free T4: 0.85 ng/dL (ref 0.61–1.12)

## 2021-09-02 LAB — COMPREHENSIVE METABOLIC PANEL
ALT: 21 U/L (ref 0–44)
AST: 18 U/L (ref 15–41)
Albumin: 3.7 g/dL (ref 3.5–5.0)
Alkaline Phosphatase: 137 U/L — ABNORMAL HIGH (ref 38–126)
Anion gap: 7 (ref 5–15)
BUN: 21 mg/dL (ref 8–23)
CO2: 26 mmol/L (ref 22–32)
Calcium: 9.2 mg/dL (ref 8.9–10.3)
Chloride: 105 mmol/L (ref 98–111)
Creatinine, Ser: 0.9 mg/dL (ref 0.61–1.24)
GFR, Estimated: >60 mL/min (ref >60)
Glucose, Bld: 325 mg/dL — ABNORMAL HIGH (ref 70–99)
Potassium: 3.8 mmol/L (ref 3.5–5.1)
Sodium: 138 mmol/L (ref 135–145)
Total Bilirubin: 0.6 mg/dL (ref 0.3–1.2)
Total Protein: 6.7 g/dL (ref 6.5–8.1)

## 2021-09-02 LAB — TSH: TSH: 0.54 u[IU]/mL (ref 0.350–4.500)

## 2021-09-02 MED ORDER — SODIUM CHLORIDE 0.9 % IV SOLN: Freq: Once | INTRAVENOUS | Status: AC

## 2021-09-02 MED ORDER — SODIUM CHLORIDE 0.9 % IV SOLN
480.0000 mg | Freq: Once | INTRAVENOUS | Status: AC
  Administered 2021-09-02: 480 mg via INTRAVENOUS
  Filled 2021-09-02: qty 48

## 2021-09-02 MED ORDER — ONDANSETRON 4 MG PO TBDP
4.0000 mg | ORAL_TABLET | Freq: Three times a day (TID) | ORAL | 0 refills | Status: DC | PRN
Start: 2021-09-02 — End: 2021-11-04

## 2021-09-02 NOTE — Patient Instructions (Signed)
[facility] MEDICAL ONCOLOGY   Discharge Instructions: Thank you for choosing [facility] to provide your oncology and hematology care.   If you have a lab appointment with the Cancer Center, please go directly to the Cancer Center and check in at the registration area.   Wear comfortable clothing and clothing appropriate for easy access to any Portacath or PICC line.   We strive to give you quality time with your provider. You may need to reschedule your appointment if you arrive late (15 or more minutes).  Arriving late affects you and other patients whose appointments are after yours.  Also, if you miss three or more appointments without notifying the office, you may be dismissed from the clinic at the provider's discretion.      For prescription refill requests, have your pharmacy contact our office and allow 72 hours for refills to be completed.    Today you received the following chemotherapy and/or immunotherapy agents: nivolumab      To help prevent nausea and vomiting after your treatment, we encourage you to take your nausea medication as directed.  BELOW ARE SYMPTOMS THAT SHOULD BE REPORTED IMMEDIATELY: *FEVER GREATER THAN 100.4 F (38 C) OR HIGHER *CHILLS OR SWEATING *NAUSEA AND VOMITING THAT IS NOT CONTROLLED WITH YOUR NAUSEA MEDICATION *UNUSUAL SHORTNESS OF BREATH *UNUSUAL BRUISING OR BLEEDING *URINARY PROBLEMS (pain or burning when urinating, or frequent urination) *BOWEL PROBLEMS (unusual diarrhea, constipation, pain near the anus) TENDERNESS IN MOUTH AND THROAT WITH OR WITHOUT PRESENCE OF ULCERS (sore throat, sores in mouth, or a toothache) UNUSUAL RASH, SWELLING OR PAIN  UNUSUAL VAGINAL DISCHARGE OR ITCHING   Items with * indicate a potential emergency and should be followed up as soon as possible or go to the Emergency Department if any problems should occur.  Please show the CHEMOTHERAPY ALERT CARD or IMMUNOTHERAPY ALERT CARD at check-in  to the Emergency Department and triage nurse.  Should you have questions after your visit or need to cancel or reschedule your appointment, please contact [facility] MEDICAL ONCOLOGY  Dept: [phone]  and follow the prompts.  Office hours are 8:00 a.m. to 4:30 p.m. Monday - Friday. Please note that voicemails left after 4:00 p.m. may not be returned until the following business day.  We are closed weekends and major holidays. You have access to a nurse at all times for urgent questions. Please call the main number to the clinic Dept: [phone] and follow the prompts.   For any non-urgent questions, you may also contact your provider using MyChart. We now offer e-Visits for anyone 18 and older to request care online for non-urgent symptoms. For details visit [email or web address].   Also download the MyChart app! Go to the app store, search "MyChart", open the app, select [facility], and log in with your MyChart username and password.  Masks are optional in the cancer centers. If you would like for your care team to wear a mask while they are taking care of you, please let them know. For doctor visits, patients may have with them one support person who is at least 79 years old. At this time, visitors are not allowed in the infusion area. 

## 2021-09-02 NOTE — Progress Notes (Signed)
[place]   [phone] [phone]   Clinic Follow up Note   Patient Care Team: [name], MD as PCP - General (Family Medicine) Jerline Pain, MD as PCP - Cardiology (Cardiology) Ladene Artist, MD as Consulting Physician (Gastroenterology) Alla Feeling, NP as Nurse Practitioner (Nurse Practitioner) Truitt Merle, MD as Consulting Physician (Oncology) Delrae Rend, MD as Consulting Physician (Endocrinology)  Date of Service:  09/02/2021  CHIEF COMPLAINT: f/u of esophageal cancer  CURRENT THERAPY:  Nivolumab, starting 03/31/21, currently q4weeks  ASSESSMENT & PLAN:  Jeffrey [name] is a 81 y.o. male with   1. Adenocarcinoma of the GE junction, cTX N1 M0, HER2 negative, MMR normal, PD-L1 CPS 3% -He presented with 6-8 months of dysphagia and 10 pounds weight loss and a palpable right cervical lymph node. Initial CT neck 10/22/20 showed 2.8 cm LN, concerning for metastatic disease.  -CT 12/08/20 showed suspicious mass at GE junction and prominent gastrohepatic LNs. EGD 12/17/20 by Dr. Fuller Plan showed 5 cm distal esophageal mass, biopsy confirmed adenocarcinoma with signet ring features. HER 2 negative -PET scan 12/30/20 showed FDG-avid mass at the GE junction and mild uptake with borderline gastrohepatic ligament lymph nodes, no distant metastasis. -He began concurrent chemo RT with weekly carbo/Taxol 01/18/21, C1 carbo was dose reduced to AUC 1.5 for age and co-morbidities. He tolerated moderately well with fatigue, decreased appetite/taste change, nausea, and constipation. -cervical lymph node biopsy on 01/29/21 showed atypical cells.  -he completed chemo on 12/19 and radiation on 02/26/21. He had slow recovery -he began nivolumab on 03/31/21; he tolerated well, no noticeable side effects. We increased to every 4 week regimen starting 06/09/21. -restaging PET scan on 06/25/21 showed positive partial response to therapy.  -we discussed extending  treatment to every 5 weeks. We will try to see if he tolerates better. Labs reviewed, CBC WNL, CMP showed BG 325, alk phos 137. Adequate to proceed with treatment today.   2. Saliva regurgitation  -he reports increasing frequency of sputum regurgitation with eating, no N/V, or dysphagia  -I recommend referral back to Dr. Fuller Plan; he may need EGD to rule out radiation induced stricture    3. Hyperglycemia: -BG 325 today (09/02/21) -he is followed by Dr. Buddy Duty [facility], last note we have is from 08/10/21.  4. Comorbidities: CVA with residual right-sided weakness, history of MI, CAD, HTN, DM, HL -Patient lives alone, his daughter checks on him frequently to help with housework and food prep -He is limited in his ADLs due to residual right-sided weakness from CVA -He ambulates with support, can bathe and dress with difficulty    PLAN: -Proceed with Cycle 8 of Nivo today and change to every 5 weeks due to fatigue  -referral back to Dr. Fuller Plan for dysphagia -lab, f/u, and Nivo in 5 weeks   No problem-specific Assessment & Plan notes found for this encounter.   SUMMARY OF ONCOLOGIC HISTORY: Oncology History Overview Note  Cancer Staging Esophageal cancer [facility]) Staging form: Esophagus - Adenocarcinoma, AJCC 8th Edition - Clinical stage from 12/31/2020: Stage IIA (cT1, cN1, cM0, GX) - Unsigned    Esophageal cancer ([place])  10/22/2020 Imaging   CT neck IMPRESSION: 2.8 cm right level 2A lymph node, which appears to be centrally necrotic, concerning for metastatic disease. No additional lymphadenopathy or other acute process in the neck.     11/30/2020 Imaging   Barium swallow FINDINGS: Limitations on positioning due to sequelae of stroke. The patient swallowed barium without difficulty.  No aspiration. There is no mass or stricture. Normal motility. There is no hiatal hernia. No spontaneous gastroesophageal reflux. Barium pill was swallowed and passed without difficulty.   IMPRESSION: No  significant abnormality.   12/08/2020 Imaging   CT chest abdomen with contrast IMPRESSION: 1. Mass is suspected just at the level of the GE junction. Suspicious for primary esophageal neoplasm. Further evaluation with endoscopy is advised. 2. Prominent perigastric lymph nodes are identified, suspicious for metastatic adenopathy. 3. Gallstone. 4. Coronary artery calcifications. 5. Aortic Atherosclerosis (ICD10-I70.0) and Emphysema (ICD10-J43.9). 6. These results will be called to the ordering clinician or representative by the Radiologist Assistant, and communication documented in the PACS or [employer].     12/17/2020 Procedure   EGD by Dr. Fuller Plan - A medium-sized, fungating mass with contact bleeding was found in the distal esophagus, 36 cm from the incisors extending to 41 cm in the cardia. The mass was partially obstructing and circumferential.   12/17/2020 Initial Biopsy   Diagnosis Esophagus, biopsy, distal and gastric cardia mass - ADENOCARCINOMA WITH SIGNET RING FEATURES.   12/19/2020 Initial Diagnosis   Esophageal cancer ([place])   12/30/2020 PET scan   IMPRESSION: 1. Mass at the level of the GE junction and proximal stomach is FDG avid within SUV max of 7.62. Findings compatible with primary gastric neoplasm. 2. There is mild FDG uptake associated with borderline gastrohepatic ligament lymph nodes which have an SUV max of 2.97. Equivocal for nodal metastasis. 3. No signs of distant metastatic disease. 4. Aortic Atherosclerosis (ICD10-I70.0) and Emphysema (ICD10-J43.9). Coronary artery calcifications. 5. Gallstones. 6. Prostate gland enlargement.   01/08/2021 Imaging   EXAM: ULTRASOUND OF HEAD/NECK SOFT TISSUES  IMPRESSION: Sonographic evaluation of the right-side of the neck confirms a minimally complex cystic lesion with broad differential considerations including cystic/necrotic cervical lymphadenopathy (favored given presence of an additional smaller  apparently partially cystic right cervical lymph node), compatible with the findings on preceding PET-CT.   This minimally complex cystic structure/mass could undergo ultrasound-guided biopsy/aspiration as indicated.   01/18/2021 - 02/22/2021 Chemotherapy   Patient is on Treatment Plan : ESOPHAGUS Carboplatin/PACLitaxel weekly x 6 weeks with XRT       01/29/2021 Pathology Results   A. RIGHT, SUBMANDIBULAR CYST, FINE NEEDLE  ASPIRATION:   FINAL MICROSCOPIC DIAGNOSIS:  - Atypical cells present DIAGNOSTIC COMMENTS:  There are clusters of epithelioid cells with nuclear grooves and  pseudoinclusions admixed with pigmented histiocytes.  There is  insufficient material for ancillary studies.  Excisional biopsy may be  of interest.   03/31/2021 -  Chemotherapy   Patient is on Treatment Plan : HEAD/NECK Nivolumab q14d        INTERVAL HISTORY:  Jeffrey [name] is here for a follow up of esophageal cancer. He was last seen by PA Murray Hodgkins on 08/06/21. He presents to the clinic accompanied by his daughter. They report he is experiencing more regurgitation of sputum following meals. He explains the sputum does not come up when he's not eating. She also reports he experienced a fall recently. He was evaluated by his PCP on 6/26. He explains he lost his balance coming out of the shower.   All other systems were reviewed with the patient and are negative.  MEDICAL HISTORY:  Past Medical History:  Diagnosis Date   Anterior cerebral circulation hemorrhagic infarction [facility]) 11/24/2014   March, 2016, dominant left thalamic and left internal capsule ischemic infarct with resultant hemorrhagic transformation, secondary to small vessel disease. Resulted in right hemiparesis dysarthria and  diplopia   //   readmission with aphasia July, 2016, this improved    Arthritis    CAD (coronary artery disease)    PCI distal RCA ...2004, residual 70% LAD   /   ...nuclear...03/2007...no ischemia.Marland KitchenMarland Kitchenpreserved LV /   nuclear...03/03/2009...inferior scar..no ischemia..EF 51%   Cyst of nasopharynx    per ENT Wilburn Cornelia   Dyslipidemia    takes Atorvastatin daily   GERD (gastroesophageal reflux disease)    takes Omeprazole daily   Gout    takes Allopurinol daily and Colchicine as needed   HCAP (healthcare-associated pneumonia)    Hemiparesis affecting right side as late effect of cerebrovascular accident ([place]) 09/05/2014   History of colon polyps    HTN (hypertension)    takes Cardura,Metoprolol,[name],and Amlodipine daily   Internal hemorrhoids    Myocardial infarction [facility]) 53yr ago   Peripheral edema    takes Lasix daily   Pharyngeal or nasopharyngeal cyst 05/2013   with chronic hoarseness s/p excision   Right carotid bruit    Seasonal allergic rhinitis    Stroke ([place]    Type 2 diabetes, uncontrolled, with neuropathy 1989   takes Invokana daily and has an insulin pump ([name]    SURGICAL HISTORY: Past Surgical History:  Procedure Laterality Date   BALLOON ANGIOPLASTY, ARTERY  1992, 2004   CAD, Dr. KRon Parker  CARDIAC CATHETERIZATION  2004   CATARACT EXTRACTION Bilateral 2010   COLONOSCOPY  04/03/2002   adenomatous polyp, int hemorrhoids   COLONOSCOPY  07/18/2007   normal (Dr. SFuller Plan   COLONOSCOPY  09/26/2012   tubular adenoma, sm int hem, rpt 5 yrs (Fuller Plan   ELBOW SURGERY Right 1997   eye lids raised     NASAL SEPTUM SURGERY     POLYPECTOMY N/A 05/27/2013   Procedure: ENDOSCOPIC NASOPHARYNGEAL MASS;  Surgeon: DJerrell Belfast MD   ROTATOR CUFF REPAIR  09/2011   left, with subacromial decompression   TONSILLECTOMY AND ADENOIDECTOMY      I have reviewed the social history and family history with the patient and they are unchanged from previous note.  ALLERGIES:  is allergic to niacin.  MEDICATIONS:  Current Outpatient Medications  Medication Sig Dispense Refill   ondansetron (ZOFRAN-ODT) 4 MG disintegrating tablet Take 1 tablet (4 mg total) by mouth every 8 (eight) hours as  needed for nausea or vomiting. 20 tablet 0   atorvastatin (LIPITOR) 80 MG tablet Take 80 mg by mouth at bedtime.     baclofen (LIORESAL) 10 MG tablet Take 10 mg by mouth 3 (three) times daily.     cholecalciferol (VITAMIN D3) 25 MCG (1000 UNIT) tablet Take 1,000 Units by mouth daily.     clopidogrel (PLAVIX) 75 MG tablet Take 1 tablet (75 mg total) by mouth daily.     empagliflozin (JARDIANCE) 25 MG TABS tablet Take 25 mg by mouth daily.     fosinopril ([name]) 40 MG tablet Take 1 tablet (40 mg total) by mouth daily.     furosemide (LASIX) 20 MG tablet Take 20-40 mg by mouth See admin instructions. Take 20 mg on Mon, Wed, Fri, and Sun. Take 40 mg on Tues, Thurs, and Sat     insulin aspart (NOVOLOG FLEXPEN) 100 UNIT/ML FlexPen Inject 10 Units into the skin 2 (two) times daily with a meal. Breakfast and dinner     insulin glargine (LANTUS) 100 UNIT/ML Solostar Pen Inject 20 Units into the skin at bedtime.     LACTOBACILLUS PO Take 1 capsule by mouth  every Monday, Wednesday, and Friday.     loratadine (CLARITIN) 10 MG tablet Take 10 mg by mouth.     megestrol (MEGACE ES) 625 MG/5ML suspension TAKE 5 MLS (625 MG TOTAL) BY MOUTH DAILY. 450 mL 1   metoprolol tartrate (LOPRESSOR) 25 MG tablet Take 1 tablet (25 mg total) by mouth 2 (two) times daily.     mirabegron ER (MYRBETRIQ) 25 MG TB24 tablet Take 1 tablet (25 mg total) by mouth daily. 30 tablet 3   Olopatadine HCl (PATADAY OP) Place 1 drop into both eyes daily as needed (allergies).     pantoprazole (PROTONIX) 20 MG tablet TAKE 1 TABLET BY MOUTH EVERY DAY 90 tablet 0   Potassium 99 MG TABS Take 1 tablet (99 mg total) by mouth daily as needed (when you take 2 lasix pills).     sertraline (ZOLOFT) 25 MG tablet Take 1 tablet (25 mg total) by mouth daily.     No current facility-administered medications for this visit.    PHYSICAL EXAMINATION: ECOG PERFORMANCE STATUS: 3 - Symptomatic, >50% confined to bed  Vitals:   09/02/21 1101  BP: (!)  159/78  Pulse: 64  Resp: 18  Temp: 97.8 F (36.6 C)  SpO2: 100%   Wt Readings from Last 3 Encounters:  09/02/21 149 lb 8 oz (67.8 kg)  08/30/21 148 lb 6 oz (67.3 kg)  08/06/21 155 lb 8 oz (70.5 kg)     GENERAL:alert, no distress and comfortable SKIN: skin color normal, no rashes or significant lesions EYES: normal, Conjunctiva are pink and non-injected, sclera clear  NEURO: alert & oriented x 3 with fluent speech  LABORATORY DATA:  I have reviewed the data as listed    Latest Ref Rng & Units 09/02/2021   10:19 AM 08/06/2021    9:34 AM 07/08/2021   10:51 AM  CBC  WBC 4.0 - 10.5 K/uL 6.3  5.6  5.9   Hemoglobin 13.0 - 17.0 g/dL 13.2  12.8  13.7   Hematocrit 39.0 - 52.0 % 39.5  38.3  38.9   Platelets 150 - 400 K/uL 172  145  142         Latest Ref Rng & Units 09/02/2021   10:19 AM 08/06/2021    9:34 AM 07/08/2021   10:51 AM  CMP  Glucose 70 - 99 mg/dL 325  293  120   BUN 8 - 23 mg/dL _0 Creatinine 0.61 - 1.24 mg/dL 0.90  0.96  0.87   Sodium 135 - 145 mmol/L 138  138  140   Potassium 3.5 - 5.1 mmol/L 3.8  3.5  3.3   Chloride 98 - 111 mmol/L 105  106  109   CO2 22 - 32 mmol/L _1 Calcium 8.9 - 10.3 mg/dL 9.2  9.3  9.0   Total Protein 6.5 - 8.1 g/dL 6.7  6.6  6.7   Total Bilirubin 0.3 - 1.2 mg/dL 0.6  0.7  0.7   Alkaline Phos 38 - 126 U/L 137  109  99   AST 15 - 41 U/L _2 ALT 0 - 44 U/L _3 RADIOGRAPHIC STUDIES: I have personally reviewed the radiological images as listed and agreed with the findings in the report. No results found.    Orders Placed This Encounter  Procedures   CBC with Differential/Platelet    Standing  Status:   Standing    Number of Occurrences:   50    Standing Expiration Date:   09/02/2022   Comprehensive metabolic panel    Standing Status:   Standing    Number of Occurrences:   50    Standing Expiration Date:   09/02/2022   TSH    Standing Status:   Standing    Number of Occurrences:   10     Standing Expiration Date:   09/02/2022   T4, free    Standing Status:   Standing    Number of Occurrences:   10    Standing Expiration Date:   09/02/2022   All questions were answered. The patient knows to call the clinic with any problems, questions or concerns. No barriers to learning was detected. The total time spent in the appointment was 30 minutes.     Truitt Merle, MD 09/02/2021   I, Wilburn Mylar, am acting as scribe for Truitt Merle, MD.   I have reviewed the above documentation for accuracy and completeness, and I agree with the above.

## 2021-09-02 NOTE — Telephone Encounter (Signed)
Ladene Artist, MD  Carl Best, RN  Direct schedule EGD with clearance to hold Plavix for 5 days prior please.

## 2021-09-03 ENCOUNTER — Telehealth: Payer: Self-pay

## 2021-09-03 ENCOUNTER — Other Ambulatory Visit: Payer: Self-pay

## 2021-09-03 NOTE — Telephone Encounter (Signed)
[facility] Pre-operative Risk Assessment     Request for surgical clearance:     Endoscopy Procedure  What type of surgery is being performed?     EGD  When is this surgery scheduled?     10/05/21  What type of clearance is required ?   Pharmacy  Are there any medications that need to be held prior to surgery and how long? Plavix, 5 days prior  Practice name and name of physician performing surgery?      [facility]  What is your office phone and fax number?      Phone- [phone]  [phone]  Anesthesia type (None, local, MAC, general) ?       MAC

## 2021-09-03 NOTE — Telephone Encounter (Signed)
   Name: Jeffrey [name]  DOB: [birth year]  MRN: [id number]   Primary Cardiologist: Candee Furbish, MD  Chart reviewed as part of pre-operative protocol coverage.  Jeffrey [name] was last seen on 03/23/21 by Ermalinda Barrios, PA-C.    Remote history of PCI in 2004.  Okay to hold Plavix x5 days prior to procedure from a cardiovascular standpoint however will need to check with neurology due to the information below in bold.  History of CVA 2016 with hemorrhagic transformation and right sided hemiparesis now on plavix for secondary prevention per Dr. Antionette Char movement since his CVA  I will route this recommendation to the requesting party via [place] fax function and remove from pre-op pool. Please call with questions.  Elgie Collard, PA-C 09/03/2021, 4:19 PM

## 2021-09-03 NOTE — Telephone Encounter (Signed)
Patient has been scheduled for EGD on 10/05/21 at 10:00 am in the [facility] with Dr. Fuller Plan. PV scheduled for 09/23/21 at 11:00 am. Patient is diabetic & on Plavix. Clearance letter to be sent to cardiologist. Amb ref placed. Patient's daughter is aware & advised to call back with any questions.

## 2021-09-03 NOTE — Telephone Encounter (Signed)
Notified Patient's Daughter [name], per Patient's request, of prior authorization approval for Ondansetron ODT '4mg'$  Tablets. Medication is approved through 03/06/2022.

## 2021-09-06 ENCOUNTER — Telehealth: Payer: Self-pay

## 2021-09-06 NOTE — Telephone Encounter (Signed)
Nikki called in from [place], wanted to advise Dr.G, that patient has asked for a delay of service until next Tuesday.

## 2021-09-06 NOTE — Progress Notes (Signed)
ANNUAL DIABETIC FOOT EXAM  Subjective: Jeffrey [name] presents today for annual diabetic foot examination.  Patient relates long term h/o diabetes.  Patient denies any h/o foot wounds.  Patient denies any numbness, tingling, burning, or pins/needle sensation in feet.  Patient's blood sugar was 160 mg/dl today.  Risk factors: diabetes, diabetic neuropathy, h/o CVA, h/o MI, HTN, CAD, CHF, hyperlipidemia, h/o tobacco use in remission.  [name], MD is patient's PCP. Last visit was May 28, 2021.  Past Medical History:  Diagnosis Date   Anterior cerebral circulation hemorrhagic infarction [facility]) 11/24/2014   March, 2016, dominant left thalamic and left internal capsule ischemic infarct with resultant hemorrhagic transformation, secondary to small vessel disease. Resulted in right hemiparesis dysarthria and diplopia   //   readmission with aphasia July, 2016, this improved    Arthritis    CAD (coronary artery disease)    PCI distal RCA ...2004, residual 70% LAD   /   ...nuclear...03/2007...no ischemia.Marland KitchenMarland Kitchenpreserved LV /  nuclear...03/03/2009...inferior scar..no ischemia..EF 51%   Cyst of nasopharynx    per ENT Wilburn Cornelia   Dyslipidemia    takes Atorvastatin daily   GERD (gastroesophageal reflux disease)    takes Omeprazole daily   Gout    takes Allopurinol daily and Colchicine as needed   HCAP (healthcare-associated pneumonia)    Hemiparesis affecting right side as late effect of cerebrovascular accident ([place]) 09/05/2014   History of colon polyps    HTN (hypertension)    takes Cardura,Metoprolol,[name],and Amlodipine daily   Internal hemorrhoids    Myocardial infarction [facility]) 58yr ago   Peripheral edema    takes Lasix daily   Pharyngeal or nasopharyngeal cyst 05/2013   with chronic hoarseness s/p excision   Right carotid bruit    Seasonal allergic rhinitis    Stroke ([place]    Type 2 diabetes, uncontrolled, with neuropathy 1989   takes Invokana daily and has an insulin  pump ([name]   Patient Active Problem List   Diagnosis Date Noted   Contracture, right hand 08/30/2021   Overactive bladder 08/27/2021   Sacral pressure sore 05/31/2021   Multiple falls 03/22/2021   Hematoma 03/22/2021   Esophageal cancer ([place] 12/19/2020   Benign neoplasm of colon 11/06/2020   Benign prostatic hyperplasia 11/06/2020   Head and neck lymphadenopathy 10/14/2020   Esophageal dysphagia 10/14/2020   Balanitis 10/14/2020   Impaired mobility 04/17/2020   Trigger middle finger of left hand 11/06/2019   Urinary urgency 08/27/2019   Encounter for general adult medical examination with abnormal findings 07/27/2016   Hypertropia of right eye 03/22/2016   Medicare annual wellness visit, subsequent 07/23/2015   Advanced care planning/counseling discussion 07/23/2015   Diplopia 07/15/2015   Anterior cerebral circulation hemorrhagic infarction ([place] 11/24/2014   Hemiparesis affecting right side as late effect of cerebrovascular accident ([place] 09/05/2014   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 09/05/2014   Chronic diastolic heart failure, NYHA class 1 ([place] 06/05/2014   Benign paroxysmal positional vertigo 04/28/2014   Right carotid bruit    Seasonal allergic rhinitis    Trigger thumb of left hand 10/12/2011   Dysphonia 01/04/2011   Gout    Adenomatous polyps    CAD (coronary artery disease)    Hypertension associated with diabetes ([place]    Hyperlipidemia associated with type 2 diabetes mellitus ([place]    Type 2 diabetes mellitus with neurological complications ([place]    Ejection fraction    DOE (dyspnea on exertion) 02/11/2009   Chronic ischemic heart disease 03/07/1990  Past Surgical History:  Procedure Laterality Date   BALLOON ANGIOPLASTY, ARTERY  1992, 2004   CAD, Dr. Ron Parker   CARDIAC CATHETERIZATION  2004   CATARACT EXTRACTION Bilateral 2010   COLONOSCOPY  04/03/2002   adenomatous polyp, int hemorrhoids   COLONOSCOPY  07/18/2007   normal (Dr. Fuller Plan)   COLONOSCOPY   09/26/2012   tubular adenoma, sm int hem, rpt 5 yrs Fuller Plan)   ELBOW SURGERY Right 1997   eye lids raised     NASAL SEPTUM SURGERY     POLYPECTOMY N/A 05/27/2013   Procedure: ENDOSCOPIC NASOPHARYNGEAL MASS;  Surgeon: [name], MD   ROTATOR CUFF REPAIR  09/2011   left, with subacromial decompression   TONSILLECTOMY AND ADENOIDECTOMY     Current Outpatient Medications on File Prior to Visit  Medication Sig Dispense Refill   atorvastatin (LIPITOR) 80 MG tablet Take 80 mg by mouth at bedtime.     baclofen (LIORESAL) 10 MG tablet Take 10 mg by mouth 3 (three) times daily.     cholecalciferol (VITAMIN D3) 25 MCG (1000 UNIT) tablet Take 1,000 Units by mouth daily.     clopidogrel (PLAVIX) 75 MG tablet Take 1 tablet (75 mg total) by mouth daily.     empagliflozin (JARDIANCE) 25 MG TABS tablet Take 25 mg by mouth daily.     fosinopril ([name]) 40 MG tablet Take 1 tablet (40 mg total) by mouth daily.     furosemide (LASIX) 20 MG tablet Take 20-40 mg by mouth See admin instructions. Take 20 mg on Mon, Wed, Fri, and Sun. Take 40 mg on Tues, Thurs, and Sat     insulin aspart (NOVOLOG FLEXPEN) 100 UNIT/ML FlexPen Inject 10 Units into the skin 2 (two) times daily with a meal. Breakfast and dinner     insulin glargine (LANTUS) 100 UNIT/ML Solostar Pen Inject 20 Units into the skin at bedtime.     LACTOBACILLUS PO Take 1 capsule by mouth every Monday, Wednesday, and Friday.     loratadine (CLARITIN) 10 MG tablet Take 10 mg by mouth.     megestrol (MEGACE ES) 625 MG/5ML suspension TAKE 5 MLS (625 MG TOTAL) BY MOUTH DAILY. 450 mL 1   metoprolol tartrate (LOPRESSOR) 25 MG tablet Take 1 tablet (25 mg total) by mouth 2 (two) times daily.     mirabegron ER (MYRBETRIQ) 25 MG TB24 tablet Take 1 tablet (25 mg total) by mouth daily. 30 tablet 3   Olopatadine HCl (PATADAY OP) Place 1 drop into both eyes daily as needed (allergies).     pantoprazole (PROTONIX) 20 MG tablet TAKE 1 TABLET BY MOUTH EVERY DAY 90  tablet 0   Potassium 99 MG TABS Take 1 tablet (99 mg total) by mouth daily as needed (when you take 2 lasix pills).     sertraline (ZOLOFT) 25 MG tablet Take 1 tablet (25 mg total) by mouth daily.     [DISCONTINUED] prochlorperazine (COMPAZINE) 10 MG tablet Take 1 tablet (10 mg total) by mouth every 6 (six) hours as needed (Nausea or vomiting). 30 tablet 1   No current facility-administered medications on file prior to visit.    Allergies  Allergen Reactions   Niacin Other (See Comments)    flushed skin/rash    Social History   Occupational History   Occupation: Music therapist: [employer]  Tobacco Use   Smoking status: Former   Smokeless tobacco: Never   Tobacco comments:    quit smoking at age 54  Vaping Use   Vaping Use: Never used  Substance and Sexual Activity   Alcohol use: No   Drug use: No   Sexual activity: Yes   Family History  Problem Relation Age of Onset   Alcohol abuse Father    Heart attack Father    Cancer Sister 67       breast cancer   Heart attack Son    Coronary artery disease Neg Hx    Stroke Neg Hx    Diabetes Neg Hx    Colon cancer Neg Hx    Immunization History  Administered Date(s) Administered   Fluad Quad(high Dose 65+) 01/01/2019, 01/15/2020   Influenza Split 02/01/2012, 01/17/2013, 02/04/2014, 12/08/2014, 12/22/2015   Influenza, High Dose Seasonal PF 01/30/2018, 11/24/2020   Influenza,inj,Quad PF,6+ Mos 11/01/2010, 12/22/2015   Influenza-Unspecified 02/04/2014, 12/08/2014, 12/29/2016   PFIZER Comirnaty(Gray Top)Covid-19 Tri-Sucrose Vaccine 04/21/2020   PFIZER(Purple Top)SARS-COV-2 Vaccination 04/30/2019, 05/21/2019   Pfizer Covid-19 Vaccine Bivalent Booster 37yr & up 01/05/2021   Pneumococcal Conjugate-13 02/04/2014   Pneumococcal Polysaccharide-23 03/07/2004, 10/11/2012   Td 04/17/2015   Tdap 05/05/2005   Zoster Recombinat (Shingrix) 10/05/2017, 04/07/2018   Zoster, Live 03/08/2011     Review of Systems:  Negative except as noted in the HPI.   Objective: There were no vitals filed for this visit.  NSEANN GENTHERis a pleasant 80y.o. male in NAD. AAO X 3.  Vascular Examination: CFT <3 seconds b/l LE. Faintly palpable DP pulses b/l LE. Diminished PT pulse(s) b/l LE. Pedal hair absent. No pain with calf compression b/l. Lower extremity skin temperature gradient within normal limits. Trace edema noted BLE. No cyanosis or clubbing noted b/l LE.  Dermatological Examination: Pedal skin thin, shiny and atrophic b/l LE. No open wounds b/l LE. No interdigital macerations noted b/l LE. Toenails 1-5 b/l elongated, discolored, dystrophic, thickened, crumbly with subungual debris and tenderness to dorsal palpation. No hyperkeratotic nor porokeratotic lesions present on today's visit.  Neurological Examination: Pt has subjective symptoms of neuropathy. Protective sensation intact 5/5 intact bilaterally with 10g monofilament b/l. Vibratory sensation intact b/l.  Musculoskeletal Examination: Muscle strength 5/5 to all LE muscle groups of left lower extremity.  Hemiparesis of RLE. Utilizes wheelchair for mobility assistance.  Footwear Assessment: Does the patient wear appropriate shoes? Yes. Does the patient need inserts/orthotics? No.     Latest Ref Rng & Units 08/30/2021   11:25 AM 05/20/2021   10:57 AM 04/27/2021   12:00 AM 10/14/2020   12:20 PM  Hemoglobin A1C  Hemoglobin-A1c 4.0 - 5.6 % 8.1  7.9  6.3     7.4      This result is from an external source.   Assessment: 1. Pain due to onychomycosis of toenails of both feet   2. Hemiparesis affecting right side as late effect of cerebrovascular accident ([place]   3. Diabetic peripheral neuropathy associated with type 2 diabetes mellitus ([place]   4. Encounter for diabetic foot exam ([place]     ADA Risk Categorization:  High Risk  Patient has one or more of the following: Loss of protective sensation Absent pedal pulses Severe Foot  deformity History of foot ulcer  Plan: -Patient was evaluated and treated. All patient's and/or POA's questions/concerns answered on today's visit. -Diabetic foot examination performed today. -Continue foot and shoe inspections daily. Monitor blood glucose per PCP/Endocrinologist's recommendations. -Patient to continue soft, supportive shoe gear daily. -Mycotic toenails 1-5 bilaterally were debrided in length and girth with sterile nail nippers and dremel without incident. -  Patient/POA to call should there be question/concern in the interim. Return in about 3 months (around 11/27/2021).  Marzetta Board, DPM

## 2021-09-08 NOTE — Telephone Encounter (Signed)
Jeffrey [name],   SEE phone note. Do you have a Plavix clearance hold from neurology? Thank you, Yulieth Carrender PV

## 2021-09-08 NOTE — Telephone Encounter (Signed)
Noted  

## 2021-09-10 ENCOUNTER — Telehealth: Payer: Self-pay

## 2021-09-10 NOTE — Telephone Encounter (Signed)
Refer to phone note 7/7.

## 2021-09-10 NOTE — Telephone Encounter (Signed)
Called & spoke with patient's daughter and confirmed that patient has not had a stroke or TIA symptoms in the last 6 months, so patient may hold plavix 5 days prior to procedure. Patient's daughter has also requested to reschedule procedure to a sooner time. New EGD date is 09/28/21 at 3:00 pm & PV moved to 09/16/21 at 1:30 pm.

## 2021-09-10 NOTE — Telephone Encounter (Signed)
-----   Message from Garvin Fila, MD sent at 09/10/2021 11:58 AM EDT ----- This patient has not been seen in the office since 2017.  I believe he had a hemorrhagic stroke prior to 2016 and is on Plavix.  If he has had no recent stroke or TIA symptoms in the last 6 months he may hold Plavix for 3 to 5 days prior to scheduled procedure with a small but acceptable periprocedural risk of TIA/stroke if patient is willing.  Resume Plavix after procedure when safe ----- Message ----- From: Carl Best, RN Sent: 09/10/2021   9:42 AM EDT To: Garvin Fila, MD

## 2021-09-14 ENCOUNTER — Telehealth: Payer: Self-pay

## 2021-09-14 DIAGNOSIS — C158 Malignant neoplasm of overlapping sites of esophagus: Secondary | ICD-10-CM | POA: Diagnosis not present

## 2021-09-14 DIAGNOSIS — R1319 Other dysphagia: Secondary | ICD-10-CM | POA: Diagnosis not present

## 2021-09-14 DIAGNOSIS — F0631 Mood disorder due to known physiological condition with depressive features: Secondary | ICD-10-CM | POA: Diagnosis not present

## 2021-09-14 DIAGNOSIS — I69398 Other sequelae of cerebral infarction: Secondary | ICD-10-CM | POA: Diagnosis not present

## 2021-09-14 DIAGNOSIS — Z7409 Other reduced mobility: Secondary | ICD-10-CM | POA: Diagnosis not present

## 2021-09-14 DIAGNOSIS — I5032 Chronic diastolic (congestive) heart failure: Secondary | ICD-10-CM | POA: Diagnosis not present

## 2021-09-14 DIAGNOSIS — I69351 Hemiplegia and hemiparesis following cerebral infarction affecting right dominant side: Secondary | ICD-10-CM | POA: Diagnosis not present

## 2021-09-14 DIAGNOSIS — E1149 Type 2 diabetes mellitus with other diabetic neurological complication: Secondary | ICD-10-CM | POA: Diagnosis not present

## 2021-09-14 DIAGNOSIS — Z79630 Long term (current) use of alkylating agent: Secondary | ICD-10-CM | POA: Diagnosis not present

## 2021-09-14 DIAGNOSIS — R296 Repeated falls: Secondary | ICD-10-CM | POA: Diagnosis not present

## 2021-09-14 NOTE — Telephone Encounter (Signed)
Home Health verbal orders  Agency Name:  [facility]   Requesting PT  Frequency: 2W4  Please forward to [facility] pool or providers CMA

## 2021-09-15 NOTE — Telephone Encounter (Signed)
Spoke with Raquel Sarna of [facility] informing her Dr. Darnell Level is giving verbal orders of services request.

## 2021-09-15 NOTE — Telephone Encounter (Signed)
Agree with this.  

## 2021-09-16 ENCOUNTER — Ambulatory Visit (AMBULATORY_SURGERY_CENTER): Payer: Self-pay | Admitting: *Deleted

## 2021-09-16 VITALS — Ht 68.0 in | Wt 148.0 lb

## 2021-09-16 DIAGNOSIS — K2289 Other specified disease of esophagus: Secondary | ICD-10-CM

## 2021-09-16 DIAGNOSIS — R1310 Dysphagia, unspecified: Secondary | ICD-10-CM

## 2021-09-16 NOTE — Progress Notes (Signed)
No egg or soy allergy known to patient  No issues known to pt with past sedation with any surgeries or procedures Patient denies ever being told they had issues or difficulty with intubation  No FH of Malignant Hyperthermia Pt is not on diet pills Pt is not on  home 02  Pt is on Plavix, instructions given to hold for 5 days prior to procedure. Diabetic med instructions also explained.  No A fib or A flutter   PV completed in person.Daughter here with pt, she is his POA.  Pt verified name, DOB.  Procedure explained to pt. Prep instructions reviewed, questions answered. Pt encouraged to call with questions or issues.  If pt has My chart, procedure instructions sent via My Chart

## 2021-09-17 NOTE — Telephone Encounter (Signed)
error 

## 2021-09-23 ENCOUNTER — Encounter: Payer: Self-pay | Admitting: Gastroenterology

## 2021-09-27 ENCOUNTER — Other Ambulatory Visit: Payer: Self-pay

## 2021-09-28 ENCOUNTER — Ambulatory Visit (AMBULATORY_SURGERY_CENTER): Payer: PPO | Admitting: Gastroenterology

## 2021-09-28 ENCOUNTER — Encounter: Payer: Self-pay | Admitting: Gastroenterology

## 2021-09-28 VITALS — BP 158/72 | HR 79 | Temp 98.6°F | Resp 18 | Ht 68.0 in | Wt 145.0 lb

## 2021-09-28 DIAGNOSIS — R1310 Dysphagia, unspecified: Secondary | ICD-10-CM

## 2021-09-28 DIAGNOSIS — C160 Malignant neoplasm of cardia: Secondary | ICD-10-CM | POA: Diagnosis not present

## 2021-09-28 DIAGNOSIS — B3781 Candidal esophagitis: Secondary | ICD-10-CM | POA: Diagnosis not present

## 2021-09-28 DIAGNOSIS — C155 Malignant neoplasm of lower third of esophagus: Secondary | ICD-10-CM | POA: Diagnosis not present

## 2021-09-28 MED ORDER — SODIUM CHLORIDE 0.9 % IV SOLN: 500.0000 mL | Freq: Once | INTRAVENOUS | Status: DC

## 2021-09-28 NOTE — Progress Notes (Signed)
Pt's states no medical or surgical changes since previsit or office visit. 

## 2021-09-28 NOTE — Patient Instructions (Signed)
Full liquid diet as tolerated. Advance to soft diet as tolerated. Awaiting pathology results. Follow up with Dr. Burr Medico. Resume Plavix at prior dose in 2 days. Refer to managing MD for further adjustments of therapy.  YOU HAD AN ENDOSCOPIC PROCEDURE TODAY AT [facility]:   Refer to the procedure report that was given to you for any specific questions about what was found during the examination.  If the procedure report does not answer your questions, please call your gastroenterologist to clarify.  If you requested that your care partner not be given the details of your procedure findings, then the procedure report has been included in a sealed envelope for you to review at your convenience later.  YOU SHOULD EXPECT: Some feelings of bloating in the abdomen. Passage of more gas than usual.  Walking can help get rid of the air that was put into your GI tract during the procedure and reduce the bloating. If you had a lower endoscopy (such as a colonoscopy or flexible sigmoidoscopy) you may notice spotting of blood in your stool or on the toilet paper. If you underwent a bowel prep for your procedure, you may not have a normal bowel movement for a few days.  Please Note:  You might notice some irritation and congestion in your nose or some drainage.  This is from the oxygen used during your procedure.  There is no need for concern and it should clear up in a day or so.  SYMPTOMS TO REPORT IMMEDIATELY:  Following upper endoscopy (EGD)  Vomiting of blood or coffee ground material  New chest pain or pain under the shoulder blades  Painful or persistently difficult swallowing  New shortness of breath  Fever of 100F or higher  Black, tarry-looking stools  For urgent or emergent issues, a gastroenterologist can be reached at any hour by calling [phone]. Do not use MyChart messaging for urgent concerns.    DIET:  We do recommend a small meal at first, but then you may proceed to  your regular diet.  Drink plenty of fluids but you should avoid alcoholic beverages for 24 hours.  ACTIVITY:  You should plan to take it easy for the rest of today and you should NOT DRIVE or use heavy machinery until tomorrow (because of the sedation medicines used during the test).    FOLLOW UP: Our staff will call the number listed on your records the next business day following your procedure.  We will call around 7:15- 8:00 am to check on you and address any questions or concerns that you may have regarding the information given to you following your procedure. If we do not reach you, we will leave a message.  If you develop any symptoms (ie: fever, flu-like symptoms, shortness of breath, cough etc.) before then, please call [phone].  If you test positive for Covid 19 in the 2 weeks post procedure, please call and report this information to [place].    If any biopsies were taken you will be contacted by phone or by letter within the next 1-3 weeks.  Please call us at [phone] if you have not heard about the biopsies in 3 weeks.    SIGNATURES/CONFIDENTIALITY: You and/or your care partner have signed paperwork which will be entered into your electronic medical record.  These signatures attest to the fact that that the information above on your After Visit Summary has been reviewed and is understood.  Full responsibility of the confidentiality of this  discharge information lies with you and/or your care-partner.  

## 2021-09-28 NOTE — Progress Notes (Signed)
See 09/02/2021 H&P, no changes

## 2021-09-28 NOTE — Progress Notes (Signed)
Called to room to assist during endoscopic procedure.  Patient ID and intended procedure confirmed with present staff. Received instructions for my participation in the procedure from the performing physician.  

## 2021-09-28 NOTE — Progress Notes (Signed)
Pt in recovery with monitors in place, VSS. Report given to receiving RN. Bite guard was placed with pt awake to ensure comfort. No dental or soft tissue damage noted. 

## 2021-09-28 NOTE — Op Note (Signed)
[facility] Patient Name: Jeffrey [name] Procedure Date: 09/28/2021 3:27 PM MRN: [id number] Endoscopist: Ladene Artist , MD Age: 81 Referring MD:  Date of Birth: [birth year] Gender: Male Account #: [id number] Procedure:                Upper GI endoscopy Indications:              Dysphagia, Follow-up of malignant esophageal                            adenocarcinoma Medicines:                Monitored Anesthesia Care Procedure:                Pre-Anesthesia Assessment:                           - Prior to the procedure, a History and Physical                            was performed, and patient medications and                            allergies were reviewed. The patient's tolerance of                            previous anesthesia was also reviewed. The risks                            and benefits of the procedure and the sedation                            options and risks were discussed with the patient.                            All questions were answered, and informed consent                            was obtained. Prior Anticoagulants: The patient has                            taken Plavix (clopidogrel), last dose was 5 days                            prior to procedure. ASA Grade Assessment: III - A                            patient with severe systemic disease. After                            reviewing the risks and benefits, the patient was                            deemed in satisfactory condition to undergo the  procedure.                           After obtaining informed consent, the endoscope was                            passed under direct vision. Throughout the                            procedure, the patient's blood pressure, pulse, and                            oxygen saturations were monitored continuously. The                            GIF [id number] was introduced through the                             mouth, and advanced to the second part of duodenum.                            The upper GI endoscopy was accomplished without                            difficulty. The patient tolerated the procedure                            well. Scope In: Scope Out: Findings:                 A medium-sized, fungating mass with bleeding and                            stigmata of recent bleeding was found in the distal                            esophagus, at the gastroesophageal junction and in                            the cardia, 37 cm to 41 cm from the incisors. The                            mass was partially obstructing and circumferential.                            Biopsies were taken with a cold forceps for                            histology. The mass is smaller than on previous EGD.                           Patchy, yellow plaques were found in the mid  esophagus. Biopsies were taken with a cold forceps                            for histology.                           The exam of the esophagus was otherwise normal.                           A medium-sized, fungating, circumferential mass                            with oozing bleeding and stigmata of recent                            bleeding was found in the cardia as above.                           The exam of the stomach was otherwise normal.                           The duodenal bulb and second portion of the                            duodenum were normal. Complications:            No immediate complications. Estimated Blood Loss:     Estimated blood loss was minimal. Impression:               - Partially obstructing, malignant esophageal tumor                            was found at the gastroesophageal junction and in                            the cardia. Biopsied.                           - Esophageal plaques were found, suspicious for                            candidiasis. Biopsied.                            - Malignant gastric tumor in the cardia as above.                           - Normal duodenal bulb and second portion of the                            duodenum. Recommendation:           - Patient has a contact number available for                            emergencies. The signs and symptoms of potential  delayed complications were discussed with the                            patient. Return to normal activities tomorrow.                            Written discharge instructions were provided to the                            patient.                           - Full liquid to soft diet as tolerated.                           - Await pathology results.                           - Follow up with Dr. Burr Medico.                           - Resume Plavix (clopidogrel) at prior dose in 2                            days. Refer to managing physician for further                            adjustment of therapy. Ladene Artist, MD 09/28/2021 3:57:00 PM This report has been signed electronically.

## 2021-09-29 ENCOUNTER — Telehealth: Payer: Self-pay

## 2021-09-29 NOTE — Telephone Encounter (Signed)
Left message on follow up call. 

## 2021-10-01 DIAGNOSIS — F0631 Mood disorder due to known physiological condition with depressive features: Secondary | ICD-10-CM | POA: Diagnosis not present

## 2021-10-01 DIAGNOSIS — I152 Hypertension secondary to endocrine disorders: Secondary | ICD-10-CM | POA: Diagnosis not present

## 2021-10-01 DIAGNOSIS — D126 Benign neoplasm of colon, unspecified: Secondary | ICD-10-CM

## 2021-10-01 DIAGNOSIS — N400 Enlarged prostate without lower urinary tract symptoms: Secondary | ICD-10-CM

## 2021-10-01 DIAGNOSIS — Z7409 Other reduced mobility: Secondary | ICD-10-CM | POA: Diagnosis not present

## 2021-10-01 DIAGNOSIS — M24541 Contracture, right hand: Secondary | ICD-10-CM

## 2021-10-01 DIAGNOSIS — I2510 Atherosclerotic heart disease of native coronary artery without angina pectoris: Secondary | ICD-10-CM

## 2021-10-01 DIAGNOSIS — R1319 Other dysphagia: Secondary | ICD-10-CM | POA: Diagnosis not present

## 2021-10-01 DIAGNOSIS — L89151 Pressure ulcer of sacral region, stage 1: Secondary | ICD-10-CM

## 2021-10-01 DIAGNOSIS — Z7902 Long term (current) use of antithrombotics/antiplatelets: Secondary | ICD-10-CM

## 2021-10-01 DIAGNOSIS — M109 Gout, unspecified: Secondary | ICD-10-CM

## 2021-10-01 DIAGNOSIS — R296 Repeated falls: Secondary | ICD-10-CM | POA: Diagnosis not present

## 2021-10-01 DIAGNOSIS — M65312 Trigger thumb, left thumb: Secondary | ICD-10-CM

## 2021-10-01 DIAGNOSIS — H532 Diplopia: Secondary | ICD-10-CM

## 2021-10-01 DIAGNOSIS — Z79630 Long term (current) use of alkylating agent: Secondary | ICD-10-CM | POA: Diagnosis not present

## 2021-10-01 DIAGNOSIS — R490 Dysphonia: Secondary | ICD-10-CM

## 2021-10-01 DIAGNOSIS — E785 Hyperlipidemia, unspecified: Secondary | ICD-10-CM

## 2021-10-01 DIAGNOSIS — Z794 Long term (current) use of insulin: Secondary | ICD-10-CM

## 2021-10-01 DIAGNOSIS — D369 Benign neoplasm, unspecified site: Secondary | ICD-10-CM

## 2021-10-01 DIAGNOSIS — C158 Malignant neoplasm of overlapping sites of esophagus: Secondary | ICD-10-CM | POA: Diagnosis not present

## 2021-10-01 DIAGNOSIS — I5032 Chronic diastolic (congestive) heart failure: Secondary | ICD-10-CM | POA: Diagnosis not present

## 2021-10-01 DIAGNOSIS — E1169 Type 2 diabetes mellitus with other specified complication: Secondary | ICD-10-CM

## 2021-10-01 DIAGNOSIS — I69398 Other sequelae of cerebral infarction: Secondary | ICD-10-CM | POA: Diagnosis not present

## 2021-10-01 DIAGNOSIS — I69351 Hemiplegia and hemiparesis following cerebral infarction affecting right dominant side: Secondary | ICD-10-CM | POA: Diagnosis not present

## 2021-10-01 DIAGNOSIS — Z7984 Long term (current) use of oral hypoglycemic drugs: Secondary | ICD-10-CM

## 2021-10-01 DIAGNOSIS — E1159 Type 2 diabetes mellitus with other circulatory complications: Secondary | ICD-10-CM | POA: Diagnosis not present

## 2021-10-01 DIAGNOSIS — H8110 Benign paroxysmal vertigo, unspecified ear: Secondary | ICD-10-CM

## 2021-10-01 DIAGNOSIS — E1149 Type 2 diabetes mellitus with other diabetic neurological complication: Secondary | ICD-10-CM | POA: Diagnosis not present

## 2021-10-05 ENCOUNTER — Encounter: Payer: PPO | Admitting: Gastroenterology

## 2021-10-07 ENCOUNTER — Inpatient Hospital Stay: Payer: PPO | Attending: Nurse Practitioner

## 2021-10-07 ENCOUNTER — Other Ambulatory Visit: Payer: Self-pay

## 2021-10-07 ENCOUNTER — Encounter: Payer: Self-pay | Admitting: Hematology

## 2021-10-07 ENCOUNTER — Inpatient Hospital Stay: Payer: PPO

## 2021-10-07 ENCOUNTER — Inpatient Hospital Stay (HOSPITAL_BASED_OUTPATIENT_CLINIC_OR_DEPARTMENT_OTHER): Payer: PPO | Admitting: Hematology

## 2021-10-07 VITALS — BP 159/74 | HR 56 | Temp 97.9°F | Resp 18 | Wt 145.1 lb

## 2021-10-07 DIAGNOSIS — E1165 Type 2 diabetes mellitus with hyperglycemia: Secondary | ICD-10-CM | POA: Diagnosis not present

## 2021-10-07 DIAGNOSIS — C158 Malignant neoplasm of overlapping sites of esophagus: Secondary | ICD-10-CM | POA: Diagnosis not present

## 2021-10-07 DIAGNOSIS — E785 Hyperlipidemia, unspecified: Secondary | ICD-10-CM | POA: Diagnosis not present

## 2021-10-07 DIAGNOSIS — Z5112 Encounter for antineoplastic immunotherapy: Secondary | ICD-10-CM | POA: Diagnosis not present

## 2021-10-07 DIAGNOSIS — C160 Malignant neoplasm of cardia: Secondary | ICD-10-CM | POA: Insufficient documentation

## 2021-10-07 DIAGNOSIS — I10 Essential (primary) hypertension: Secondary | ICD-10-CM | POA: Diagnosis not present

## 2021-10-07 LAB — COMPREHENSIVE METABOLIC PANEL
ALT: 15 U/L (ref 0–44)
AST: 16 U/L (ref 15–41)
Albumin: 3.9 g/dL (ref 3.5–5.0)
Alkaline Phosphatase: 178 U/L — ABNORMAL HIGH (ref 38–126)
Anion gap: 6 (ref 5–15)
BUN: 15 mg/dL (ref 8–23)
CO2: 29 mmol/L (ref 22–32)
Calcium: 9 mg/dL (ref 8.9–10.3)
Chloride: 104 mmol/L (ref 98–111)
Creatinine, Ser: 0.85 mg/dL (ref 0.61–1.24)
GFR, Estimated: >60 mL/min (ref >60)
Glucose, Bld: 202 mg/dL — ABNORMAL HIGH (ref 70–99)
Potassium: 3.7 mmol/L (ref 3.5–5.1)
Sodium: 139 mmol/L (ref 135–145)
Total Bilirubin: 0.7 mg/dL (ref 0.3–1.2)
Total Protein: 6.7 g/dL (ref 6.5–8.1)

## 2021-10-07 LAB — CBC WITH DIFFERENTIAL/PLATELET
Abs Immature Granulocytes: 0.01 10*3/uL (ref 0.00–0.07)
Basophils Absolute: 0 10*3/uL (ref 0.0–0.1)
Basophils Relative: 1 %
Eosinophils Absolute: 0.1 10*3/uL (ref 0.0–0.5)
Eosinophils Relative: 1 %
HCT: 41.9 % (ref 39.0–52.0)
Hemoglobin: 14 g/dL (ref 13.0–17.0)
Immature Granulocytes: 0 %
Lymphocytes Relative: 12 %
Lymphs Abs: 0.6 10*3/uL — ABNORMAL LOW (ref 0.7–4.0)
MCH: 30.2 pg (ref 26.0–34.0)
MCHC: 33.4 g/dL (ref 30.0–36.0)
MCV: 90.3 fL (ref 80.0–100.0)
Monocytes Absolute: 0.4 10*3/uL (ref 0.1–1.0)
Monocytes Relative: 7 %
Neutro Abs: 4.2 10*3/uL (ref 1.7–7.7)
Neutrophils Relative %: 79 %
Platelets: 124 10*3/uL — ABNORMAL LOW (ref 150–400)
RBC: 4.64 MIL/uL (ref 4.22–5.81)
RDW: 13.3 % (ref 11.5–15.5)
WBC: 5.3 10*3/uL (ref 4.0–10.5)
nRBC: 0 % (ref 0.0–0.2)

## 2021-10-07 MED ORDER — SODIUM CHLORIDE 0.9 % IV SOLN: Freq: Once | INTRAVENOUS | Status: AC

## 2021-10-07 MED ORDER — SODIUM CHLORIDE 0.9 % IV SOLN
480.0000 mg | Freq: Once | INTRAVENOUS | Status: AC
  Administered 2021-10-07: 480 mg via INTRAVENOUS
  Filled 2021-10-07: qty 48

## 2021-10-07 MED ORDER — ONDANSETRON HCL 8 MG PO TABS: 8.0000 mg | ORAL_TABLET | Freq: Three times a day (TID) | ORAL | 1 refills | Status: AC | PRN

## 2021-10-07 NOTE — Patient Instructions (Signed)
[facility] MEDICAL [facility]COLOGY   Discharge Instructions: Thank you for choosing [facility] to provide your oncology and hematology care.   If you have a lab appointment with the Cancer Center, please go directly to the Cancer Center and check in at the registration area.   Wear comfortable clothing and clothing appropriate for easy access to any Portacath or PICC line.   We strive to give you quality time with your provider. You may need to reschedule your appointment if you arrive late (15 or more minutes).  Arriving late affects you and other patients whose appointments are after yours.  Also, if you miss three or more appointments without notifying the office, you may be dismissed from the clinic at the provider's discretion.      For prescription refill requests, have your pharmacy contact our office and allow 72 hours for refills to be completed.    Today you received the following chemotherapy and/or immunotherapy agents: nivolumab      To help prevent nausea and vomiting after your treatment, we encourage you to take your nausea medication as directed.  BELOW ARE SYMPTOMS THAT SHOULD BE REPORTED IMMEDIATELY: *FEVER GREATER THAN 100.4 F (38 C) OR HIGHER *CHILLS OR SWEATING *NAUSEA AND VOMITING THAT IS NOT CONTROLLED WITH YOUR NAUSEA MEDICATION *UNUSUAL SHORTNESS OF BREATH *UNUSUAL BRUISING OR BLEEDING *URINARY PROBLEMS (pain or burning when urinating, or frequent urination) *BOWEL PROBLEMS (unusual diarrhea, constipation, pain near the anus) TENDERNESS IN MOUTH AND THROAT WITH OR WITHOUT PRESENCE OF ULCERS (sore throat, sores in mouth, or a toothache) UNUSUAL RASH, SWELLING OR PAIN  UNUSUAL VAGINAL DISCHARGE OR ITCHING   Items with * indicate a potential emergency and should be followed up as soon as possible or go to the Emergency Department if any problems should occur.  Please show the CHEMOTHERAPY ALERT CARD or IMMUNOTHERAPY ALERT CARD at check-in  to the Emergency Department and triage nurse.  Should you have questions after your visit or need to cancel or reschedule your appointment, please contact [facility]  Dept: [phone]  and follow the prompts.  Office hours are 8:00 a.m. to 4:30 p.m. Monday - Friday. Please note that voicemails left after 4:00 p.m. may not be returned until the following business day.  We are closed weekends and major holidays. You have access to a nurse at all times for urgent questions. Please call the main number to the clinic Dept: [phone] and follow the prompts.   For any non-urgent questions, you may also contact your provider using MyChart. We now offer e-Visits for anyone 18 and older to request care online for non-urgent symptoms. For details visit [email or web address].   Also download the MyChart app! Go to the app store, search "MyChart", open the app, select , and log in with your MyChart username and password.  Masks are optional in the cancer centers. If you would like for your care team to wear a mask while they are taking care of you, please let them know. You may have one support person who is at least 79 years old accompany you for your appointments. 

## 2021-10-07 NOTE — Progress Notes (Addendum)
[name]   Telephone:([phone]) [phone] Fax:([phone]) [phone]   Clinic Follow up Note   Patient Care Team: [name], MD as PCP - General (Family Medicine) Jerline Pain, MD as PCP - Cardiology (Cardiology) Ladene Artist, MD as Consulting Physician (Gastroenterology) Alla Feeling, NP as Nurse Practitioner (Nurse Practitioner) Truitt Merle, MD as Consulting Physician (Oncology) Delrae Rend, MD as Consulting Physician (Endocrinology)  Date of Service:  10/07/2021  CHIEF COMPLAINT: f/u of esophageal cancer  CURRENT THERAPY:  Nivolumab, starting 03/31/21, currently q4weeks  ASSESSMENT & PLAN:  Jeffrey [name] is a 81 y.o. male with   1. Adenocarcinoma of the GE junction, cTX N1 M0, HER2 negative, MMR normal, PD-L1 CPS 3% -He presented with 6-8 months of dysphagia and 10 pounds weight loss and a palpable right cervical lymph node. Initial CT neck 10/22/20 showed 2.8 cm LN, concerning for metastatic disease.  -EGD 12/17/20 by Dr. Fuller Plan showed 5 cm distal esophageal mass, biopsy confirmed adenocarcinoma with signet ring features. HER 2 negative -PET scan 12/30/20 showed no distant metastasis. -s/p concurrent chemo RT with weekly carbo/Taxol 01/18/21 - 02/26/22. He tolerated moderately well with fatigue, decreased appetite/taste change, nausea, and constipation. He had slow recovery -he began nivolumab on 03/31/21; he tolerates well, with only fatigue. He is now receiving every 4 weeks. -restaging PET scan on 06/25/21 showed positive partial response to therapy.  -he underwent EGD on 09/28/21 for worsening dysphagia/regurgitation. His primary esophageal mass was smaller than initial EGD, path confirmed residual disease. Biopsy from mid esophagus showed fungal infection. -he is clinically stable. Labs reviewed, overall stable. Will proceed with nivo today.  -plan for repeat scan before his next treatment.   2. Saliva regurgitation  -he reports increasing frequency of  sputum regurgitation with eating, no N/V, or dysphagia  -he underwent EGD on 09/28/21 showing fungal infection.   3. Hyperglycemia: -BG improved to 202 today (10/07/21) -he is followed by Dr. Buddy Duty [facility], last note we have is from 08/10/21.   4. Comorbidities: CVA with residual right-sided weakness, history of MI, CAD, HTN, DM, HL -Patient lives alone, his daughter checks on him frequently to help with housework and food prep -He is limited in his ADLs due to residual right-sided weakness from CVA -He ambulates with support, can bathe and dress with difficulty     PLAN: -Proceed with Cycle 9 of Nivo today  -f/u and Nivo in 4 weeks, with lab and CT several days before -will discuss with Dr. Fuller Plan if he needs treatment for esophageal candidiasis   No problem-specific Assessment & Plan notes found for this encounter.   SUMMARY OF ONCOLOGIC HISTORY: Oncology History Overview Note  Cancer Staging Esophageal cancer [facility]) Staging form: Esophagus - Adenocarcinoma, AJCC 8th Edition - Clinical stage from 12/31/2020: Stage IIA (cT1, cN1, cM0, GX) - Unsigned    Esophageal cancer ([place])  10/22/2020 Imaging   CT neck IMPRESSION: 2.8 cm right level 2A lymph node, which appears to be centrally necrotic, concerning for metastatic disease. No additional lymphadenopathy or other acute process in the neck.     11/30/2020 Imaging   Barium swallow FINDINGS: Limitations on positioning due to sequelae of stroke. The patient swallowed barium without difficulty. No aspiration. There is no mass or stricture. Normal motility. There is no hiatal hernia. No spontaneous gastroesophageal reflux. Barium pill was swallowed and passed without difficulty.   IMPRESSION: No significant abnormality.   12/08/2020 Imaging   CT chest abdomen with contrast IMPRESSION: 1. Mass is  suspected just at the level of the GE junction. Suspicious for primary esophageal neoplasm. Further evaluation with endoscopy is  advised. 2. Prominent perigastric lymph nodes are identified, suspicious for metastatic adenopathy. 3. Gallstone. 4. Coronary artery calcifications. 5. Aortic Atherosclerosis (ICD10-I70.0) and Emphysema (ICD10-J43.9). 6. These results will be called to the ordering clinician or representative by the Radiologist Assistant, and communication documented in the PACS or [employer].     12/17/2020 Procedure   EGD by Dr. Fuller Plan - A medium-sized, fungating mass with contact bleeding was found in the distal esophagus, 36 cm from the incisors extending to 41 cm in the cardia. The mass was partially obstructing and circumferential.   12/17/2020 Initial Biopsy   Diagnosis Esophagus, biopsy, distal and gastric cardia mass - ADENOCARCINOMA WITH SIGNET RING FEATURES.   12/19/2020 Initial Diagnosis   Esophageal cancer ([place])   12/30/2020 PET scan   IMPRESSION: 1. Mass at the level of the GE junction and proximal stomach is FDG avid within SUV max of 7.62. Findings compatible with primary gastric neoplasm. 2. There is mild FDG uptake associated with borderline gastrohepatic ligament lymph nodes which have an SUV max of 2.97. Equivocal for nodal metastasis. 3. No signs of distant metastatic disease. 4. Aortic Atherosclerosis (ICD10-I70.0) and Emphysema (ICD10-J43.9). Coronary artery calcifications. 5. Gallstones. 6. Prostate gland enlargement.   01/08/2021 Imaging   EXAM: ULTRASOUND OF HEAD/NECK SOFT TISSUES  IMPRESSION: Sonographic evaluation of the right-side of the neck confirms a minimally complex cystic lesion with broad differential considerations including cystic/necrotic cervical lymphadenopathy (favored given presence of an additional smaller apparently partially cystic right cervical lymph node), compatible with the findings on preceding PET-CT.   This minimally complex cystic structure/mass could undergo ultrasound-guided biopsy/aspiration as indicated.   01/18/2021 -  02/22/2021 Chemotherapy   Patient is on Treatment Plan : ESOPHAGUS Carboplatin/PACLitaxel weekly x 6 weeks with XRT       01/29/2021 Pathology Results   A. RIGHT, SUBMANDIBULAR CYST, FINE NEEDLE  ASPIRATION:   FINAL MICROSCOPIC DIAGNOSIS:  - Atypical cells present DIAGNOSTIC COMMENTS:  There are clusters of epithelioid cells with nuclear grooves and  pseudoinclusions admixed with pigmented histiocytes.  There is  insufficient material for ancillary studies.  Excisional biopsy may be  of interest.   03/31/2021 -  Chemotherapy   Patient is on Treatment Plan : HEAD/NECK Nivolumab q14d        INTERVAL HISTORY:  Jeffrey [name] is here for a follow up of esophageal cancer. He was last seen by me on 09/02/21. He presents to the clinic accompanied by his daughter. He reports he is doing well overall, aside from continued saliva regurgitation.  His daughter also reports he is having some constipation.   All other systems were reviewed with the patient and are negative.  MEDICAL HISTORY:  Past Medical History:  Diagnosis Date   Anterior cerebral circulation hemorrhagic infarction [facility]) 11/24/2014   March, 2016, dominant left thalamic and left internal capsule ischemic infarct with resultant hemorrhagic transformation, secondary to small vessel disease. Resulted in right hemiparesis dysarthria and diplopia   //   readmission with aphasia July, 2016, this improved    Arthritis    CAD (coronary artery disease)    PCI distal RCA ...2004, residual 70% LAD   /   ...nuclear...03/2007...no ischemia.Marland KitchenMarland Kitchenpreserved LV /  nuclear...03/03/2009...inferior scar..no ischemia..EF 51%   Cancer ([place])    Cyst of nasopharynx    per ENT Wilburn Cornelia   Dyslipidemia    takes Atorvastatin daily   GERD (gastroesophageal  reflux disease)    takes Omeprazole daily   Gout    takes Allopurinol daily and Colchicine as needed   HCAP (healthcare-associated pneumonia)    Hemiparesis affecting right side as late effect of  cerebrovascular accident ([place]) 09/05/2014   History of colon polyps    HTN (hypertension)    takes Cardura,Metoprolol,[name],and Amlodipine daily   Hyperlipidemia    Internal hemorrhoids    Myocardial infarction [facility]) 22yr ago   Peripheral edema    takes Lasix daily   Pharyngeal or nasopharyngeal cyst 05/05/2013   with chronic hoarseness s/p excision   Right carotid bruit    Seasonal allergic rhinitis    [place]    Type 2 diabetes, uncontrolled, with neuropathy 03/08/1987   takes Invokana daily and has an insulin pump ([name]    SURGICAL HISTORY: Past Surgical History:  Procedure Laterality Date   BALLOON ANGIOPLASTY, ARTERY  1992, 2004   CAD, Dr. KRon Parker  CARDIAC CATHETERIZATION  2004   CATARACT EXTRACTION Bilateral 2010   COLONOSCOPY  04/03/2002   adenomatous polyp, int hemorrhoids   COLONOSCOPY  07/18/2007   normal (Dr. SFuller Plan   COLONOSCOPY  09/26/2012   tubular adenoma, sm int hem, rpt 5 yrs (Fuller Plan   ELBOW SURGERY Right 1997   eye lids raised     NASAL SEPTUM SURGERY     POLYPECTOMY N/A 05/27/2013   Procedure: ENDOSCOPIC NASOPHARYNGEAL MASS;  Surgeon: DJerrell Belfast MD   ROTATOR CUFF REPAIR  09/2011   left, with subacromial decompression   TONSILLECTOMY AND ADENOIDECTOMY      I have reviewed the social history and family history with the patient and they are unchanged from previous note.  ALLERGIES:  is allergic to niacin.  MEDICATIONS:  Current Outpatient Medications  Medication Sig Dispense Refill   ondansetron (ZOFRAN) 8 MG tablet Take 1 tablet (8 mg total) by mouth every 8 (eight) hours as needed for nausea or vomiting. 30 tablet 1   atorvastatin (LIPITOR) 80 MG tablet Take 80 mg by mouth at bedtime.     baclofen (LIORESAL) 10 MG tablet Take 10 mg by mouth 3 (three) times daily.     cholecalciferol (VITAMIN D3) 25 MCG (1000 UNIT) tablet Take 1,000 Units by mouth daily.     clopidogrel (PLAVIX) 75 MG tablet Take 1 tablet (75 mg total) by mouth  daily.     empagliflozin (JARDIANCE) 25 MG TABS tablet Take 25 mg by mouth daily.     fosinopril ([name]) 40 MG tablet Take 1 tablet (40 mg total) by mouth daily.     furosemide (LASIX) 20 MG tablet Take 20-40 mg by mouth See admin instructions. Take 20 mg on Mon, Wed, Fri, and Sun. Take 40 mg on Tues, Thurs, and Sat     insulin aspart (NOVOLOG FLEXPEN) 100 UNIT/ML FlexPen Inject 10 Units into the skin 2 (two) times daily with a meal. Breakfast and dinner     insulin glargine (LANTUS) 100 UNIT/ML Solostar Pen Inject 20 Units into the skin at bedtime.     LACTOBACILLUS PO Take 1 capsule by mouth every Monday, Wednesday, and Friday.     loratadine (CLARITIN) 10 MG tablet Take 10 mg by mouth.     megestrol (MEGACE ES) 625 MG/5ML suspension TAKE 5 MLS (625 MG TOTAL) BY MOUTH DAILY. 450 mL 1   metoprolol tartrate (LOPRESSOR) 25 MG tablet Take 1 tablet (25 mg total) by mouth 2 (two) times daily.     mirabegron ER (MYRBETRIQ) 25  MG TB24 tablet Take 1 tablet (25 mg total) by mouth daily. 30 tablet 3   Olopatadine HCl (PATADAY OP) Place 1 drop into both eyes daily as needed (allergies).     ondansetron (ZOFRAN-ODT) 4 MG disintegrating tablet Take 1 tablet (4 mg total) by mouth every 8 (eight) hours as needed for nausea or vomiting. 20 tablet 0   pantoprazole (PROTONIX) 20 MG tablet TAKE 1 TABLET BY MOUTH EVERY DAY 90 tablet 0   Potassium 99 MG TABS Take 1 tablet (99 mg total) by mouth daily as needed (when you take 2 lasix pills).     sertraline (ZOLOFT) 25 MG tablet Take 1 tablet (25 mg total) by mouth daily.     No current facility-administered medications for this visit.    PHYSICAL EXAMINATION: ECOG PERFORMANCE STATUS: 3 - Symptomatic, >50% confined to bed  Vitals:   10/07/21 1134  BP: (!) 159/74  Pulse: (!) 56  Resp: 18  Temp: 97.9 F (36.6 C)  SpO2: 99%   Wt Readings from Last 3 Encounters:  10/07/21 145 lb 1 oz (65.8 kg)  09/28/21 145 lb (65.8 kg)  09/16/21 148 lb (67.1 kg)      GENERAL:alert, no distress and comfortable SKIN: skin color normal, no rashes or significant lesions EYES: normal, Conjunctiva are pink and non-injected, sclera clear  NEURO: alert & oriented x 3 with fluent speech  LABORATORY DATA:  I have reviewed the data as listed    Latest Ref Rng & Units 10/07/2021   10:52 AM 09/02/2021   10:19 AM 08/06/2021    9:34 AM  CBC  WBC 4.0 - 10.5 K/uL 5.3  6.3  5.6   Hemoglobin 13.0 - 17.0 g/dL 14.0  13.2  12.8   Hematocrit 39.0 - 52.0 % 41.9  39.5  38.3   Platelets 150 - 400 K/uL 124  172  145         Latest Ref Rng & Units 10/07/2021   10:52 AM 09/02/2021   10:19 AM 08/06/2021    9:34 AM  CMP  Glucose 70 - 99 mg/dL 202  325  293   BUN 8 - 23 mg/dL _0 Creatinine 0.61 - 1.24 mg/dL 0.85  0.90  0.96   Sodium 135 - 145 mmol/L 139  138  138   Potassium 3.5 - 5.1 mmol/L 3.7  3.8  3.5   Chloride 98 - 111 mmol/L 104  105  106   CO2 22 - 32 mmol/L _1 Calcium 8.9 - 10.3 mg/dL 9.0  9.2  9.3   Total Protein 6.5 - 8.1 g/dL 6.7  6.7  6.6   Total Bilirubin 0.3 - 1.2 mg/dL 0.7  0.6  0.7   Alkaline Phos 38 - 126 U/L 178  137  109   AST 15 - 41 U/L _2 ALT 0 - 44 U/L _3 RADIOGRAPHIC STUDIES: I have personally reviewed the radiological images as listed and agreed with the findings in the report. No results found.    Orders Placed This Encounter  Procedures   CT CHEST ABDOMEN PELVIS W CONTRAST    Standing Status:   Future    Standing Expiration Date:   10/08/2022    Order Specific Question:   Preferred imaging location?    Answer:   [facility]    Order Specific Question:  Is Oral Contrast requested for this exam?    Answer:   Yes, Per Radiology protocol   All questions were answered. The patient knows to call the clinic with any problems, questions or concerns. No barriers to learning was detected. The total time spent in the appointment was 30 minutes.     Truitt Merle, MD 10/07/2021   I, Wilburn Mylar, am acting as scribe for Truitt Merle, MD.   I have reviewed the above documentation for accuracy and completeness, and I agree with the above.

## 2021-10-08 ENCOUNTER — Other Ambulatory Visit: Payer: Self-pay

## 2021-10-08 ENCOUNTER — Telehealth: Payer: Self-pay | Admitting: Family Medicine

## 2021-10-08 DIAGNOSIS — C158 Malignant neoplasm of overlapping sites of esophagus: Secondary | ICD-10-CM | POA: Diagnosis not present

## 2021-10-08 DIAGNOSIS — R296 Repeated falls: Secondary | ICD-10-CM | POA: Diagnosis not present

## 2021-10-08 DIAGNOSIS — Z7409 Other reduced mobility: Secondary | ICD-10-CM | POA: Diagnosis not present

## 2021-10-08 DIAGNOSIS — E1149 Type 2 diabetes mellitus with other diabetic neurological complication: Secondary | ICD-10-CM | POA: Diagnosis not present

## 2021-10-08 DIAGNOSIS — Z79630 Long term (current) use of alkylating agent: Secondary | ICD-10-CM | POA: Diagnosis not present

## 2021-10-08 DIAGNOSIS — I69398 Other sequelae of cerebral infarction: Secondary | ICD-10-CM | POA: Diagnosis not present

## 2021-10-08 DIAGNOSIS — F0631 Mood disorder due to known physiological condition with depressive features: Secondary | ICD-10-CM | POA: Diagnosis not present

## 2021-10-08 DIAGNOSIS — R1319 Other dysphagia: Secondary | ICD-10-CM | POA: Diagnosis not present

## 2021-10-08 DIAGNOSIS — I5032 Chronic diastolic (congestive) heart failure: Secondary | ICD-10-CM | POA: Diagnosis not present

## 2021-10-08 DIAGNOSIS — I69351 Hemiplegia and hemiparesis following cerebral infarction affecting right dominant side: Secondary | ICD-10-CM | POA: Diagnosis not present

## 2021-10-08 MED ORDER — FLUCONAZOLE 50 MG PO TABS: 100.0000 mg | ORAL_TABLET | Freq: Every day | ORAL | 0 refills | Status: AC

## 2021-10-08 NOTE — Telephone Encounter (Signed)
Sending note to Dr Darnell Level for approval for verbal orders for [facility] PT to extend PT  one x a week for 4 weeks.

## 2021-10-08 NOTE — Telephone Encounter (Signed)
Agree with this. Thanks.  

## 2021-10-08 NOTE — Telephone Encounter (Signed)
Lenell Antu a Physical Therapist with [facility] called and said she is seeking approval to extent PT for 1 week 4 Call back is [phone] secure

## 2021-10-08 NOTE — Telephone Encounter (Signed)
Spoke with [name] informing her Dr. G is giving verbal orders for services requested.  

## 2021-11-02 ENCOUNTER — Ambulatory Visit (HOSPITAL_COMMUNITY)
Admission: RE | Admit: 2021-11-02 | Discharge: 2021-11-02 | Disposition: A | Discharge status: Home / Self Care | Payer: PPO | Source: Ambulatory Visit | Attending: Hematology | Admitting: Hematology

## 2021-11-02 DIAGNOSIS — C158 Malignant neoplasm of overlapping sites of esophagus: Secondary | ICD-10-CM | POA: Insufficient documentation

## 2021-11-02 DIAGNOSIS — K8020 Calculus of gallbladder without cholecystitis without obstruction: Secondary | ICD-10-CM | POA: Diagnosis not present

## 2021-11-02 DIAGNOSIS — J479 Bronchiectasis, uncomplicated: Secondary | ICD-10-CM | POA: Diagnosis not present

## 2021-11-02 DIAGNOSIS — R918 Other nonspecific abnormal finding of lung field: Secondary | ICD-10-CM | POA: Diagnosis not present

## 2021-11-02 MED ORDER — SODIUM CHLORIDE (PF) 0.9 % IJ SOLN
INTRAMUSCULAR | Status: AC
  Filled 2021-11-02: qty 50

## 2021-11-02 MED ORDER — IOHEXOL 300 MG/ML  SOLN
100.0000 mL | Freq: Once | INTRAMUSCULAR | Status: AC | PRN
Start: 2021-11-02 — End: 2021-11-02
  Administered 2021-11-02: 100 mL via INTRAVENOUS

## 2021-11-03 ENCOUNTER — Other Ambulatory Visit: Payer: Self-pay

## 2021-11-03 ENCOUNTER — Telehealth: Payer: Self-pay

## 2021-11-03 NOTE — Telephone Encounter (Signed)
Diane from [facility] call to give report on pt's CT CAP w/contrast that was completed on 11/02/2021.  Notified Dr. Burr Medico of the pt's report is available in pt's chart.

## 2021-11-04 ENCOUNTER — Other Ambulatory Visit: Payer: Self-pay

## 2021-11-04 ENCOUNTER — Inpatient Hospital Stay: Payer: PPO

## 2021-11-04 ENCOUNTER — Encounter: Payer: Self-pay | Admitting: Hematology

## 2021-11-04 ENCOUNTER — Inpatient Hospital Stay (HOSPITAL_BASED_OUTPATIENT_CLINIC_OR_DEPARTMENT_OTHER): Payer: PPO | Admitting: Hematology

## 2021-11-04 VITALS — BP 153/72 | HR 56 | Temp 97.9°F | Resp 18 | Ht 68.0 in | Wt 143.3 lb

## 2021-11-04 VITALS — BP 166/72 | HR 55 | Temp 98.5°F | Resp 18

## 2021-11-04 DIAGNOSIS — C158 Malignant neoplasm of overlapping sites of esophagus: Secondary | ICD-10-CM

## 2021-11-04 DIAGNOSIS — Z5112 Encounter for antineoplastic immunotherapy: Secondary | ICD-10-CM | POA: Diagnosis not present

## 2021-11-04 LAB — COMPREHENSIVE METABOLIC PANEL
ALT: 18 U/L (ref 0–44)
AST: 17 U/L (ref 15–41)
Albumin: 3.7 g/dL (ref 3.5–5.0)
Alkaline Phosphatase: 165 U/L — ABNORMAL HIGH (ref 38–126)
Anion gap: 4 — ABNORMAL LOW (ref 5–15)
BUN: 19 mg/dL (ref 8–23)
CO2: 33 mmol/L — ABNORMAL HIGH (ref 22–32)
Calcium: 9.3 mg/dL (ref 8.9–10.3)
Chloride: 104 mmol/L (ref 98–111)
Creatinine, Ser: 0.93 mg/dL (ref 0.61–1.24)
GFR, Estimated: >60 mL/min (ref >60)
Glucose, Bld: 180 mg/dL — ABNORMAL HIGH (ref 70–99)
Potassium: 4 mmol/L (ref 3.5–5.1)
Sodium: 141 mmol/L (ref 135–145)
Total Bilirubin: 0.6 mg/dL (ref 0.3–1.2)
Total Protein: 6.5 g/dL (ref 6.5–8.1)

## 2021-11-04 LAB — CBC WITH DIFFERENTIAL/PLATELET
Abs Immature Granulocytes: 0.01 10*3/uL (ref 0.00–0.07)
Basophils Absolute: 0 10*3/uL (ref 0.0–0.1)
Basophils Relative: 1 %
Eosinophils Absolute: 0.1 10*3/uL (ref 0.0–0.5)
Eosinophils Relative: 2 %
HCT: 41 % (ref 39.0–52.0)
Hemoglobin: 13.7 g/dL (ref 13.0–17.0)
Immature Granulocytes: 0 %
Lymphocytes Relative: 14 %
Lymphs Abs: 0.8 10*3/uL (ref 0.7–4.0)
MCH: 29.5 pg (ref 26.0–34.0)
MCHC: 33.4 g/dL (ref 30.0–36.0)
MCV: 88.4 fL (ref 80.0–100.0)
Monocytes Absolute: 0.5 10*3/uL (ref 0.1–1.0)
Monocytes Relative: 9 %
Neutro Abs: 4.3 10*3/uL (ref 1.7–7.7)
Neutrophils Relative %: 74 %
Platelets: 134 10*3/uL — ABNORMAL LOW (ref 150–400)
RBC: 4.64 MIL/uL (ref 4.22–5.81)
RDW: 13.3 % (ref 11.5–15.5)
WBC: 5.8 10*3/uL (ref 4.0–10.5)
nRBC: 0 % (ref 0.0–0.2)

## 2021-11-04 MED ORDER — SODIUM CHLORIDE 0.9 % IV SOLN: Freq: Once | INTRAVENOUS | Status: AC

## 2021-11-04 MED ORDER — ONDANSETRON 4 MG PO TBDP: 4.0000 mg | ORAL_TABLET | Freq: Three times a day (TID) | ORAL | 1 refills | Status: DC | PRN

## 2021-11-04 MED ORDER — SODIUM CHLORIDE 0.9 % IV SOLN
480.0000 mg | Freq: Once | INTRAVENOUS | Status: AC
  Administered 2021-11-04: 480 mg via INTRAVENOUS
  Filled 2021-11-04: qty 48

## 2021-11-04 NOTE — Patient Instructions (Signed)
[facility]   Discharge Instructions: Thank you for choosing [facility] to provide your oncology and hematology care.   If you have a lab appointment with the Cancer Center, please go directly to the Cancer Center and check in at the registration area.   Wear comfortable clothing and clothing appropriate for easy access to any Portacath or PICC line.   We strive to give you quality time with your provider. You may need to reschedule your appointment if you arrive late (15 or more minutes).  Arriving late affects you and other patients whose appointments are after yours.  Also, if you miss three or more appointments without notifying the office, you may be dismissed from the clinic at the provider's discretion.      For prescription refill requests, have your pharmacy contact our office and allow 72 hours for refills to be completed.    Today you received the following chemotherapy and/or immunotherapy agents: nivolumab      To help prevent nausea and vomiting after your treatment, we encourage you to take your nausea medication as directed.  BELOW ARE SYMPTOMS THAT SHOULD BE REPORTED IMMEDIATELY: *FEVER GREATER THAN 100.4 F (38 C) OR HIGHER *CHILLS OR SWEATING *NAUSEA AND VOMITING THAT IS NOT CONTROLLED WITH YOUR NAUSEA MEDICATION *UNUSUAL SHORTNESS OF BREATH *UNUSUAL BRUISING OR BLEEDING *URINARY PROBLEMS (pain or burning when urinating, or frequent urination) *BOWEL PROBLEMS (unusual diarrhea, constipation, pain near the anus) TENDERNESS IN MOUTH AND THROAT WITH OR WITHOUT PRESENCE OF ULCERS (sore throat, sores in mouth, or a toothache) UNUSUAL RASH, SWELLING OR PAIN  UNUSUAL VAGINAL DISCHARGE OR ITCHING   Items with * indicate a potential emergency and should be followed up as soon as possible or go to the Emergency Department if any problems should occur.  Please show the CHEMOTHERAPY ALERT CARD or IMMUNOTHERAPY ALERT CARD at check-in  to the Emergency Department and triage nurse.  Should you have questions after your visit or need to cancel or reschedule your appointment, please contact [facility]  Dept: [phone]  and follow the prompts.  Office hours are 8:00 a.m. to 4:30 p.m. Monday - Friday. Please note that voicemails left after 4:00 p.m. may not be returned until the following business day.  We are closed weekends and major holidays. You have access to a nurse at all times for urgent questions. Please call the main number to the clinic Dept: [phone] and follow the prompts.   For any non-urgent questions, you may also contact your provider using MyChart. We now offer e-Visits for anyone 18 and older to request care online for non-urgent symptoms. For details visit [email or web address].   Also download the MyChart app! Go to the app store, search "MyChart", open the app, select [place], and log in with your MyChart username and password.  Masks are optional in the cancer centers. If you would like for your care team to wear a mask while they are taking care of you, please let them know. You may have one support person who is at least 79 years old accompany you for your appointments. 

## 2021-11-04 NOTE — Progress Notes (Addendum)
[place]   [phone] [phone]   Clinic Follow up Note   Patient Care Team: [name], MD as PCP - General (Family Medicine) Jerline Pain, MD as PCP - Cardiology (Cardiology) Ladene Artist, MD as Consulting Physician (Gastroenterology) Alla Feeling, NP as Nurse Practitioner (Nurse Practitioner) Truitt Merle, MD as Consulting Physician (Oncology) Delrae Rend, MD as Consulting Physician (Endocrinology)  Date of Service:  11/04/2021  CHIEF COMPLAINT: f/u of esophageal cancer  CURRENT THERAPY:  Nivolumab, starting 03/31/21, currently q4weeks  ASSESSMENT & PLAN:  Jeffrey [name] is a 81 y.o. male with   1. Adenocarcinoma of the GE junction, cTX N1 M0, HER2 negative, MMR normal, PD-L1 CPS 3%, pulmonary metastasis in 10/2021  -He presented with 6-8 months of dysphagia and 10 pounds weight loss and a palpable right cervical lymph node. Initial CT neck 10/22/20 showed 2.8 cm LN, concerning for metastatic disease.  -EGD 12/17/20 by Dr. Fuller Plan showed 5 cm distal esophageal mass, biopsy confirmed adenocarcinoma with signet ring features. HER 2 negative -PET scan 12/30/20 showed no distant metastasis. -s/p concurrent chemo RT with weekly carbo/Taxol 01/18/21 - 02/26/22. He tolerated moderately well with fatigue, decreased appetite/taste change, nausea, and constipation. He had slow recovery -he began nivolumab on 03/31/21; he tolerates well, with only fatigue. He is now receiving every 4 weeks. -he underwent EGD on 09/28/21 for worsening dysphagia/regurgitation. His primary esophageal mass was smaller than initial EGD, path confirmed residual disease.  -restaging CT CAP 11/02/21 showed: similar esophageal thickening; new mediastinal lymph nodes and solid bilateral pulmonary nodules up to 1.2cm; no convincing evidence of metastatic disease in abdomen or pelvis. -I personally reviewed his CT scan images, and discussed the results with them today. I  explained that the multiple enlarging lung nodules are highly suspicious, most consistent with pulmonary metastasis this is now stage IV disease. I discussed that this is still treatable. I explained treatment options with them, mainly chemo, with FOLFOX, CAPOX or Xeloda alone. These regimens are given every 2-3 weeks. I explained that Xeloda is the most tolerable but less effective to control his disease.  However he is 81 year old, with multiple comorbidities, I recommend a balance his quality of life and disease control.  Single-level Xeloda alone is a very reasonable option.  He would like to take time to consider his options, and they will call me back with their decision. -I also briefly reviewed palliative care alone and hospice, if he decides not to pursue chemo.  I discussed the logistics of the service. -he is clinically stable. Labs reviewed, overall stable. Will proceed with final nivo today.    2. Saliva regurgitation  -he reports increasing frequency of sputum regurgitation with eating, no N/V, or dysphagia  -he underwent EGD on 09/28/21 showing fungal infection.   3. Hyperglycemia: -BG improved to 180 today (11/04/21) -he is followed by Dr. Buddy Duty with [facility], last note we have is from 08/10/21.   4. Comorbidities: CVA with residual right-sided weakness, history of MI, CAD, HTN, DM, HL -Patient lives alone, his daughter checks on him frequently to help with housework and food prep -He is limited in his ADLs due to residual right-sided weakness from CVA -He ambulates with support, can bathe and dress with difficulty     PLAN: -Proceed with final Nivo today, will stop after due to disease progression  -f/u open, pending treatment decision, he will call me with his decision.   No problem-specific Assessment & Plan notes found for  this encounter.   SUMMARY OF ONCOLOGIC HISTORY: Oncology History Overview Note  Cancer Staging Esophageal cancer [facility]) Staging form: Esophagus -  Adenocarcinoma, AJCC 8th Edition - Clinical stage from 12/31/2020: Stage IIA (cT1, cN1, cM0, GX) - Unsigned    Esophageal cancer ([place])  10/22/2020 Imaging   CT neck IMPRESSION: 2.8 cm right level 2A lymph node, which appears to be centrally necrotic, concerning for metastatic disease. No additional lymphadenopathy or other acute process in the neck.     11/30/2020 Imaging   Barium swallow FINDINGS: Limitations on positioning due to sequelae of stroke. The patient swallowed barium without difficulty. No aspiration. There is no mass or stricture. Normal motility. There is no hiatal hernia. No spontaneous gastroesophageal reflux. Barium pill was swallowed and passed without difficulty.   IMPRESSION: No significant abnormality.   12/08/2020 Imaging   CT chest abdomen with contrast IMPRESSION: 1. Mass is suspected just at the level of the GE junction. Suspicious for primary esophageal neoplasm. Further evaluation with endoscopy is advised. 2. Prominent perigastric lymph nodes are identified, suspicious for metastatic adenopathy. 3. Gallstone. 4. Coronary artery calcifications. 5. Aortic Atherosclerosis (ICD10-I70.0) and Emphysema (ICD10-J43.9). 6. These results will be called to the ordering clinician or representative by the Radiologist Assistant, and communication documented in the PACS or [employer].     12/17/2020 Procedure   EGD by Dr. Fuller Plan - A medium-sized, fungating mass with contact bleeding was found in the distal esophagus, 36 cm from the incisors extending to 41 cm in the cardia. The mass was partially obstructing and circumferential.   12/17/2020 Initial Biopsy   Diagnosis Esophagus, biopsy, distal and gastric cardia mass - ADENOCARCINOMA WITH SIGNET RING FEATURES.   12/19/2020 Initial Diagnosis   Esophageal cancer ([place])   12/30/2020 PET scan   IMPRESSION: 1. Mass at the level of the GE junction and proximal stomach is FDG avid within SUV max of 7.62.  Findings compatible with primary gastric neoplasm. 2. There is mild FDG uptake associated with borderline gastrohepatic ligament lymph nodes which have an SUV max of 2.97. Equivocal for nodal metastasis. 3. No signs of distant metastatic disease. 4. Aortic Atherosclerosis (ICD10-I70.0) and Emphysema (ICD10-J43.9). Coronary artery calcifications. 5. Gallstones. 6. Prostate gland enlargement.   01/08/2021 Imaging   EXAM: ULTRASOUND OF HEAD/NECK SOFT TISSUES  IMPRESSION: Sonographic evaluation of the right-side of the neck confirms a minimally complex cystic lesion with broad differential considerations including cystic/necrotic cervical lymphadenopathy (favored given presence of an additional smaller apparently partially cystic right cervical lymph node), compatible with the findings on preceding PET-CT.   This minimally complex cystic structure/mass could undergo ultrasound-guided biopsy/aspiration as indicated.   01/18/2021 - 02/22/2021 Chemotherapy   Patient is on Treatment Plan : ESOPHAGUS Carboplatin/PACLitaxel weekly x 6 weeks with XRT       01/29/2021 Pathology Results   A. RIGHT, SUBMANDIBULAR CYST, FINE NEEDLE  ASPIRATION:   FINAL MICROSCOPIC DIAGNOSIS:  - Atypical cells present DIAGNOSTIC COMMENTS:  There are clusters of epithelioid cells with nuclear grooves and  pseudoinclusions admixed with pigmented histiocytes.  There is  insufficient material for ancillary studies.  Excisional biopsy may be  of interest.   03/31/2021 -  Chemotherapy   Patient is on Treatment Plan : HEAD/NECK Nivolumab q14d        INTERVAL HISTORY:  Jeffrey [name] is here for a follow up of esophageal cancer. He was last seen by me on 10/07/21. He presents to the clinic accompanied by his daughter. He reports he is doing very  well overall. They note his only side effects from treatment is fatigue.   All other systems were reviewed with the patient and are negative.  MEDICAL HISTORY:  Past  Medical History:  Diagnosis Date   Anterior cerebral circulation hemorrhagic infarction [facility]) 11/24/2014   March, 2016, dominant left thalamic and left internal capsule ischemic infarct with resultant hemorrhagic transformation, secondary to small vessel disease. Resulted in right hemiparesis dysarthria and diplopia   //   readmission with aphasia July, 2016, this improved    Arthritis    CAD (coronary artery disease)    PCI distal RCA ...2004, residual 70% LAD   /   ...nuclear...03/2007...no ischemia.Marland KitchenMarland Kitchenpreserved LV /  nuclear...03/03/2009...inferior scar..no ischemia..EF 51%   Cancer ([place])    Cyst of nasopharynx    per ENT Wilburn Cornelia   Dyslipidemia    takes Atorvastatin daily   GERD (gastroesophageal reflux disease)    takes Omeprazole daily   Gout    takes Allopurinol daily and Colchicine as needed   HCAP (healthcare-associated pneumonia)    Hemiparesis affecting right side as late effect of cerebrovascular accident ([place]) 09/05/2014   History of colon polyps    HTN (hypertension)    takes Cardura,Metoprolol,[name],and Amlodipine daily   Hyperlipidemia    Internal hemorrhoids    Myocardial infarction [facility]) 65yr ago   Peripheral edema    takes Lasix daily   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    SURGICAL HISTORY: Past Surgical History:  Procedure Laterality Date   BALLOON ANGIOPLASTY, ARTERY  1992, 2004   CAD, Dr. KRon Parker  CARDIAC CATHETERIZATION  2004   CATARACT EXTRACTION Bilateral 2010   COLONOSCOPY  04/03/2002   adenomatous polyp, int hemorrhoids   COLONOSCOPY  07/18/2007   normal (Dr. SFuller Plan   COLONOSCOPY  09/26/2012   tubular adenoma, sm int hem, rpt 5 yrs (Fuller Plan   ELBOW SURGERY Right 1997   eye lids raised     NASAL SEPTUM  SURGERY     POLYPECTOMY N/A 05/27/2013   Procedure: ENDOSCOPIC NASOPHARYNGEAL MASS;  Surgeon: DJerrell Belfast MD   ROTATOR CUFF REPAIR  09/2011   left, with subacromial decompression   TONSILLECTOMY AND ADENOIDECTOMY      I have reviewed the social history and family history with the patient and they are unchanged from previous note.  ALLERGIES:  is allergic to niacin.  MEDICATIONS:  Current Outpatient Medications  Medication Sig Dispense Refill   atorvastatin (LIPITOR) 80 MG tablet Take 80 mg by mouth at bedtime.     baclofen (LIORESAL) 10 MG tablet Take 10 mg by mouth 3 (three) times daily.     cholecalciferol (VITAMIN D3) 25 MCG (1000 UNIT) tablet Take 1,000 Units by mouth daily.     clopidogrel (PLAVIX) 75 MG tablet Take 1 tablet (75 mg total) by mouth daily.     empagliflozin (JARDIANCE) 25 MG TABS tablet Take 25 mg by mouth daily.     fluconazole (DIFLUCAN) 50 MG tablet Take 2 tablets (100 mg total) by mouth daily. 20 tablet 0   fosinopril ([name]) 40 MG tablet Take 1 tablet (40 mg total) by mouth daily.     furosemide (LASIX) 20 MG tablet Take 20-40 mg by mouth See admin instructions. Take 20 mg  on Mon, Wed, Fri, and Sun. Take 40 mg on Tues, Thurs, and Sat     insulin aspart (NOVOLOG FLEXPEN) 100 UNIT/ML FlexPen Inject 10 Units into the skin 2 (two) times daily with a meal. Breakfast and dinner     insulin glargine (LANTUS) 100 UNIT/ML Solostar Pen Inject 20 Units into the skin at bedtime.     LACTOBACILLUS PO Take 1 capsule by mouth every Monday, Wednesday, and Friday.     loratadine (CLARITIN) 10 MG tablet Take 10 mg by mouth.     megestrol (MEGACE ES) 625 MG/5ML suspension TAKE 5 MLS (625 MG TOTAL) BY MOUTH DAILY. 450 mL 1   metoprolol tartrate (LOPRESSOR) 25 MG tablet Take 1 tablet (25 mg total) by mouth 2 (two) times daily.     mirabegron ER (MYRBETRIQ) 25 MG TB24 tablet Take 1 tablet (25 mg total) by mouth daily. 30 tablet 3   Olopatadine HCl (PATADAY OP) Place 1 drop into  both eyes daily as needed (allergies).     ondansetron (ZOFRAN) 8 MG tablet Take 1 tablet (8 mg total) by mouth every 8 (eight) hours as needed for nausea or vomiting. 30 tablet 1   ondansetron (ZOFRAN-ODT) 4 MG disintegrating tablet Take 1 tablet (4 mg total) by mouth every 8 (eight) hours as needed for nausea or vomiting. 30 tablet 1   pantoprazole (PROTONIX) 20 MG tablet TAKE 1 TABLET BY MOUTH EVERY DAY 90 tablet 0   Potassium 99 MG TABS Take 1 tablet (99 mg total) by mouth daily as needed (when you take 2 lasix pills).     sertraline (ZOLOFT) 25 MG tablet Take 1 tablet (25 mg total) by mouth daily.     No current facility-administered medications for this visit.    PHYSICAL EXAMINATION: ECOG PERFORMANCE STATUS: 2 - Symptomatic, <50% confined to bed  Vitals:   11/04/21 1036  BP: (!) 153/72  Pulse: (!) 56  Resp: 18  Temp: 97.9 F (36.6 C)  SpO2: 100%   Wt Readings from Last 3 Encounters:  11/04/21 143 lb 4.8 oz (65 kg)  10/07/21 145 lb 1 oz (65.8 kg)  09/28/21 145 lb (65.8 kg)     GENERAL:alert, no distress and comfortable SKIN: skin color normal, no rashes or significant lesions EYES: normal, Conjunctiva are pink and non-injected, sclera clear  NEURO: alert & oriented x 3 with fluent speech  LABORATORY DATA:  I have reviewed the data as listed    Latest Ref Rng & Units 11/04/2021   10:10 AM 10/07/2021   10:52 AM 09/02/2021   10:19 AM  CBC  WBC 4.0 - 10.5 K/uL 5.8  5.3  6.3   Hemoglobin 13.0 - 17.0 g/dL 13.7  14.0  13.2   Hematocrit 39.0 - 52.0 % 41.0  41.9  39.5   Platelets 150 - 400 K/uL 134  124  172         Latest Ref Rng & Units 11/04/2021   10:10 AM 10/07/2021   10:52 AM 09/02/2021   10:19 AM  CMP  Glucose 70 - 99 mg/dL 180  202  325   BUN 8 - 23 mg/dL _0 Creatinine 0.61 - 1.24 mg/dL 0.93  0.85  0.90   Sodium 135 - 145 mmol/L 141  139  138   Potassium 3.5 - 5.1 mmol/L 4.0  3.7  3.8   Chloride 98 - 111 mmol/L 104  104  105   CO2 22 - 32 mmol/L 33  29  26   Calcium 8.9 - 10.3 mg/dL 9.3  9.0  9.2   Total Protein 6.5 - 8.1 g/dL 6.5  6.7  6.7   Total Bilirubin 0.3 - 1.2 mg/dL 0.6  0.7  0.6   Alkaline Phos 38 - 126 U/L 165  178  137   AST 15 - 41 U/L _0 ALT 0 - 44 U/L _1 RADIOGRAPHIC STUDIES: I have personally reviewed the radiological images as listed and agreed with the findings in the report. No results found.    No orders of the defined types were placed in this encounter.  All questions were answered. The patient knows to call the clinic with any problems, questions or concerns. No barriers to learning was detected. The total time spent in the appointment was 40 minutes.     Truitt Merle, MD 11/04/2021   I, Wilburn Mylar, am acting as scribe for Truitt Merle, MD.   I have reviewed the above documentation for accuracy and completeness, and I agree with the above.

## 2021-11-05 DIAGNOSIS — I5032 Chronic diastolic (congestive) heart failure: Secondary | ICD-10-CM | POA: Diagnosis not present

## 2021-11-05 DIAGNOSIS — F0631 Mood disorder due to known physiological condition with depressive features: Secondary | ICD-10-CM | POA: Diagnosis not present

## 2021-11-05 DIAGNOSIS — E1149 Type 2 diabetes mellitus with other diabetic neurological complication: Secondary | ICD-10-CM | POA: Diagnosis not present

## 2021-11-05 DIAGNOSIS — R1319 Other dysphagia: Secondary | ICD-10-CM | POA: Diagnosis not present

## 2021-11-05 DIAGNOSIS — Z7409 Other reduced mobility: Secondary | ICD-10-CM | POA: Diagnosis not present

## 2021-11-05 DIAGNOSIS — Z79630 Long term (current) use of alkylating agent: Secondary | ICD-10-CM | POA: Diagnosis not present

## 2021-11-05 DIAGNOSIS — I69398 Other sequelae of cerebral infarction: Secondary | ICD-10-CM | POA: Diagnosis not present

## 2021-11-05 DIAGNOSIS — I69351 Hemiplegia and hemiparesis following cerebral infarction affecting right dominant side: Secondary | ICD-10-CM | POA: Diagnosis not present

## 2021-11-05 DIAGNOSIS — R296 Repeated falls: Secondary | ICD-10-CM | POA: Diagnosis not present

## 2021-11-05 DIAGNOSIS — C158 Malignant neoplasm of overlapping sites of esophagus: Secondary | ICD-10-CM | POA: Diagnosis not present

## 2021-11-11 DIAGNOSIS — Z794 Long term (current) use of insulin: Secondary | ICD-10-CM | POA: Diagnosis not present

## 2021-11-11 DIAGNOSIS — I10 Essential (primary) hypertension: Secondary | ICD-10-CM | POA: Diagnosis not present

## 2021-11-11 DIAGNOSIS — E785 Hyperlipidemia, unspecified: Secondary | ICD-10-CM | POA: Diagnosis not present

## 2021-11-11 DIAGNOSIS — E11319 Type 2 diabetes mellitus with unspecified diabetic retinopathy without macular edema: Secondary | ICD-10-CM | POA: Diagnosis not present

## 2021-11-11 DIAGNOSIS — Z8673 Personal history of transient ischemic attack (TIA), and cerebral infarction without residual deficits: Secondary | ICD-10-CM | POA: Diagnosis not present

## 2021-11-11 DIAGNOSIS — Z23 Encounter for immunization: Secondary | ICD-10-CM | POA: Diagnosis not present

## 2021-11-11 DIAGNOSIS — E1142 Type 2 diabetes mellitus with diabetic polyneuropathy: Secondary | ICD-10-CM | POA: Diagnosis not present

## 2021-11-18 ENCOUNTER — Telehealth: Payer: Self-pay

## 2021-11-18 ENCOUNTER — Other Ambulatory Visit: Payer: Self-pay | Admitting: Hematology

## 2021-11-18 ENCOUNTER — Other Ambulatory Visit (HOSPITAL_COMMUNITY): Payer: Self-pay

## 2021-11-18 MED ORDER — CAPECITABINE 500 MG PO TABS
ORAL_TABLET | ORAL | 1 refills | Status: DC
  Filled 2021-11-18: qty 42, fill #0

## 2021-11-18 NOTE — Telephone Encounter (Signed)
Daughter called to advise that patient would like to start the "chemo pill" discussed at visit with Dr. Burr Medico.

## 2021-11-19 ENCOUNTER — Telehealth: Payer: Self-pay

## 2021-11-19 ENCOUNTER — Other Ambulatory Visit (HOSPITAL_COMMUNITY): Payer: Self-pay

## 2021-11-19 ENCOUNTER — Telehealth: Payer: Self-pay | Admitting: Pharmacy Technician

## 2021-11-19 NOTE — Telephone Encounter (Signed)
Oral Oncology Patient Advocate Encounter  After completing a benefits investigation, prior authorization for Capecitabine is not required at this time through [employer] D.  Patient's copay is $0.    Lady Deutscher, CPhT-Adv Oncology Pharmacy Patient [place] Direct Number: [phone]  Fax: [phone]

## 2021-11-19 NOTE — Telephone Encounter (Signed)
Called and spoke with daughter [name] to advise that Dr. Burr Medico has ordered Xeloda for pt to begin week on 9/25 with a f/u apt the next week. She understands that she will receive a call from Leron Croak- pharmacist -to discuss steps moving forward.

## 2021-11-22 ENCOUNTER — Telehealth: Payer: Self-pay | Admitting: Pharmacist

## 2021-11-22 ENCOUNTER — Other Ambulatory Visit (HOSPITAL_COMMUNITY): Payer: Self-pay

## 2021-11-22 DIAGNOSIS — C158 Malignant neoplasm of overlapping sites of esophagus: Secondary | ICD-10-CM

## 2021-11-22 MED ORDER — CAPECITABINE 500 MG PO TABS
ORAL_TABLET | ORAL | 1 refills | Status: AC
  Filled 2021-11-22: qty 42, 14d supply, fill #0
  Filled 2021-11-22: qty 42, fill #0

## 2021-11-22 NOTE — Telephone Encounter (Signed)
error 

## 2021-11-22 NOTE — Telephone Encounter (Signed)
Oral Oncology Pharmacist Encounter  Received new prescription for Xeloda (capecitabine) for the treatment of metastatic esophageal cancer, planned duration until disease progression or unacceptable drug toxicity.  CBC w/ Diff and CMP from 11/04/21 assessed, no relevant lab abnormalities requiring baseline dose adjustment required at this time.   Current medication list in Epic reviewed, DDIs with Xeloda identified: Category C DDI between Xeloda and Pantoprazole - proton-pump inhibitors can decrease efficacy of Xeloda - will discuss with patient if they are able to switch to alternative, such as H2RA's like famotidine while on Xeloda.  Evaluated chart and no patient barriers to medication adherence noted.   Prescription has been e-scribed to the [facility] for benefits analysis and approval.  Oral Oncology Clinic will continue to follow for insurance authorization, copayment issues, initial counseling and start date.  Leron Croak, PharmD, BCPS, [facility] Hematology/Oncology Clinical Pharmacist [facility] and [place] [phone] 11/22/2021 7:58 AM

## 2021-11-22 NOTE — Telephone Encounter (Signed)
Oral Chemotherapy Pharmacist Encounter  I spoke with patient's daughter, Berton Lan, for overview of: Xeloda (capecitabine) for the treatment of metastatic esophageal cancer, planned duration until disease progression or unacceptable drug toxicity.  Counseled on administration, dosing, side effects, monitoring, drug-food interactions, safe handling, storage, and disposal.   Patient will take Xeloda '500mg'$  tablets, 3 tablets ('1500mg'$ ) by mouth in AM and 3 tabs ('1500mg'$ ) by mouth in PM, within 30 minutes of finishing meals, for 7 days on, 7 days off, repeated every 14 days.  Discussed if patient has issues with swallowing Xeloda tablets to please call and I will provide specific instructions on how to dissolve Xeloda in water for administration. Patient's daughter expressed appreciation and understanding.  Xeloda start date: 11/29/21  Adverse effects include but are not limited to: fatigue, decreased blood counts, GI upset, diarrhea, mouth sores, and hand-foot syndrome. Patient has anti-emetic on hand and knows to take it if nausea develops.   Patient will obtain anti diarrheal and alert the office of 4 or more loose stools above baseline.  Discussed use of hand cream such as [place] Smooth Extra Care 20 to help keep hands/feet moisturized while on Xeloda, due to risk of hand-foot syndrome with medication.  Reviewed importance of keeping a medication schedule and plan for any missed doses. No barriers to medication adherence identified.  Medication reconciliation performed and medication/allergy list updated. Patient will try to switch to Pepcid (famotidine) and/or Tums PRN while on Xeloda due to risk of decreased efficacy of Xeloda when combined with pantoprazole.   All questions answered.  Patient's daughter voiced understanding and appreciation.   Medication education handout and medication calendar placed in mail for patient. Patient and patient's family knows to call the  office with questions or concerns. Oral Chemotherapy Clinic phone number provided.   Leron Croak, PharmD, BCPS, [facility] Hematology/Oncology Clinical Pharmacist [facility] and [place] [phone] 11/22/2021 4:40 PM

## 2021-11-23 ENCOUNTER — Other Ambulatory Visit (HOSPITAL_COMMUNITY): Payer: Self-pay

## 2021-11-26 ENCOUNTER — Other Ambulatory Visit (HOSPITAL_COMMUNITY): Payer: Self-pay

## 2021-11-29 ENCOUNTER — Encounter: Payer: Self-pay | Admitting: Hematology

## 2021-11-29 ENCOUNTER — Other Ambulatory Visit: Payer: Self-pay

## 2021-11-29 ENCOUNTER — Inpatient Hospital Stay: Payer: PPO | Attending: Nurse Practitioner | Admitting: Hematology

## 2021-11-29 ENCOUNTER — Other Ambulatory Visit (HOSPITAL_COMMUNITY): Payer: Self-pay

## 2021-11-29 DIAGNOSIS — R1310 Dysphagia, unspecified: Secondary | ICD-10-CM | POA: Diagnosis not present

## 2021-11-29 DIAGNOSIS — C7802 Secondary malignant neoplasm of left lung: Secondary | ICD-10-CM | POA: Diagnosis not present

## 2021-11-29 DIAGNOSIS — E119 Type 2 diabetes mellitus without complications: Secondary | ICD-10-CM

## 2021-11-29 DIAGNOSIS — C158 Malignant neoplasm of overlapping sites of esophagus: Secondary | ICD-10-CM | POA: Diagnosis not present

## 2021-11-29 DIAGNOSIS — R634 Abnormal weight loss: Secondary | ICD-10-CM | POA: Diagnosis not present

## 2021-11-29 DIAGNOSIS — C7801 Secondary malignant neoplasm of right lung: Secondary | ICD-10-CM

## 2021-11-29 DIAGNOSIS — I10 Essential (primary) hypertension: Secondary | ICD-10-CM

## 2021-11-29 MED ORDER — ONDANSETRON 4 MG PO TBDP
4.0000 mg | ORAL_TABLET | Freq: Three times a day (TID) | ORAL | 1 refills | Status: AC | PRN
Start: 2021-11-29 — End: ?

## 2021-11-29 NOTE — Progress Notes (Signed)
[employer]   [phone] Fax:([phone]) [phone]   Clinic Follow up Note   Patient Care Team: [name], MD as PCP - General (Family Medicine) Jerline Pain, MD as PCP - Cardiology (Cardiology) Ladene Artist, MD as Consulting Physician (Gastroenterology) Alla Feeling, NP as Nurse Practitioner (Nurse Practitioner) Truitt Merle, MD as Consulting Physician (Oncology) Delrae Rend, MD as Consulting Physician (Endocrinology) [facility] as Consulting Physician [facility] and Palliative Medicine)  Date of Service:  11/29/2021  I connected with Jeffrey [name] on 11/29/2021 at 10:20 AM EDT by telephone visit and verified that I am speaking with the correct person using two identifiers.  I discussed the limitations, risks, security and privacy concerns of performing an evaluation and management service by telephone and the availability of in person appointments. I also discussed with the patient that there may be a patient responsible charge related to this service. The patient expressed understanding and agreed to proceed.   Other persons participating in the visit and their role in the encounter:  pt's daughter  Patient's location:  home Provider's location:  my office  CHIEF COMPLAINT: f/u of esophageal cancer  CURRENT THERAPY:  Supportive Care  ASSESSMENT & PLAN:  Jeffrey [name] is a 81 y.o. male with   1. Adenocarcinoma of the GE junction, cTX N1 M0, HER2 negative, MMR normal, PD-L1 CPS 3%, pulmonary metastasis in 10/2021  -He presented with 6-8 months of dysphagia and 10 pounds weight loss and a palpable right cervical lymph node. Initial CT neck 10/22/20 showed 2.8 cm LN, concerning for metastatic disease.  -EGD 12/17/20 by Dr. Fuller Plan showed 5 cm distal esophageal mass, biopsy confirmed adenocarcinoma with signet ring features. HER 2 negative -PET scan 12/30/20 showed no distant metastasis. -s/p concurrent chemo RT with weekly carbo/Taxol  01/18/21 - 02/26/22. He tolerated moderately well with fatigue, decreased appetite/taste change, nausea, and constipation. He had slow recovery -he began nivolumab on 03/31/21; he tolerates well, with only fatigue. He is now receiving every 4 weeks. -he underwent EGD on 09/28/21 for worsening dysphagia/regurgitation. His primary esophageal mass was smaller than initial EGD, path confirmed residual disease.  -restaging CT CAP 11/02/21 showed: similar esophageal thickening; new mediastinal lymph nodes and solid bilateral pulmonary nodules up to 1.2cm; no convincing evidence of metastatic disease in abdomen or pelvis. -we again discussed treatment versus supportive care alone. He is not very interested in proceeding with treatment. I reviewed palliative care and hospice and discussed the logistics of the service. They would like to proceed with home palliative care at this time, he is not ready for [facility] yet; I will refer him to [facility]. -I will see him as needed.  I will cancel his follow-up next week, and again encouraged him to call me if he has any concerns.   2.  Dysphagia and weight loss -Secondary to his esophageal cancer -We discussed the role of feeding tube, and I discouraged that.  He agrees not to have a feeding tube.     PLAN: -referral to [facility] for home palliative care. -f/u as needed    No problem-specific Assessment & Plan notes found for this encounter.   SUMMARY OF ONCOLOGIC HISTORY: Oncology History Overview Note  Cancer Staging Esophageal cancer [facility]) Staging form: Esophagus - Adenocarcinoma, AJCC 8th Edition - Clinical stage from 12/31/2020: Stage IIA (cT1, cN1, cM0, GX) - Unsigned    Esophageal cancer ([place])  10/22/2020 Imaging   CT neck IMPRESSION: 2.8 cm right level 2A lymph node,  which appears to be centrally necrotic, concerning for metastatic disease. No additional lymphadenopathy or other acute process in the neck.     11/30/2020 Imaging   Barium  swallow FINDINGS: Limitations on positioning due to sequelae of stroke. The patient swallowed barium without difficulty. No aspiration. There is no mass or stricture. Normal motility. There is no hiatal hernia. No spontaneous gastroesophageal reflux. Barium pill was swallowed and passed without difficulty.   IMPRESSION: No significant abnormality.   12/08/2020 Imaging   CT chest abdomen with contrast IMPRESSION: 1. Mass is suspected just at the level of the GE junction. Suspicious for primary esophageal neoplasm. Further evaluation with endoscopy is advised. 2. Prominent perigastric lymph nodes are identified, suspicious for metastatic adenopathy. 3. Gallstone. 4. Coronary artery calcifications. 5. Aortic Atherosclerosis (ICD10-I70.0) and Emphysema (ICD10-J43.9). 6. These results will be called to the ordering clinician or representative by the Radiologist Assistant, and communication documented in the PACS or [employer].     12/17/2020 Procedure   EGD by Dr. Fuller Plan - A medium-sized, fungating mass with contact bleeding was found in the distal esophagus, 36 cm from the incisors extending to 41 cm in the cardia. The mass was partially obstructing and circumferential.   12/17/2020 Initial Biopsy   Diagnosis Esophagus, biopsy, distal and gastric cardia mass - ADENOCARCINOMA WITH SIGNET RING FEATURES.   12/19/2020 Initial Diagnosis   Esophageal cancer ([place])   12/30/2020 PET scan   IMPRESSION: 1. Mass at the level of the GE junction and proximal stomach is FDG avid within SUV max of 7.62. Findings compatible with primary gastric neoplasm. 2. There is mild FDG uptake associated with borderline gastrohepatic ligament lymph nodes which have an SUV max of 2.97. Equivocal for nodal metastasis. 3. No signs of distant metastatic disease. 4. Aortic Atherosclerosis (ICD10-I70.0) and Emphysema (ICD10-J43.9). Coronary artery calcifications. 5. Gallstones. 6. Prostate gland  enlargement.   01/08/2021 Imaging   EXAM: ULTRASOUND OF HEAD/NECK SOFT TISSUES  IMPRESSION: Sonographic evaluation of the right-side of the neck confirms a minimally complex cystic lesion with broad differential considerations including cystic/necrotic cervical lymphadenopathy (favored given presence of an additional smaller apparently partially cystic right cervical lymph node), compatible with the findings on preceding PET-CT.   This minimally complex cystic structure/mass could undergo ultrasound-guided biopsy/aspiration as indicated.   01/18/2021 - 02/22/2021 Chemotherapy   Patient is on Treatment Plan : ESOPHAGUS Carboplatin/PACLitaxel weekly x 6 weeks with XRT       01/29/2021 Pathology Results   A. RIGHT, SUBMANDIBULAR CYST, FINE NEEDLE  ASPIRATION:   FINAL MICROSCOPIC DIAGNOSIS:  - Atypical cells present DIAGNOSTIC COMMENTS:  There are clusters of epithelioid cells with nuclear grooves and  pseudoinclusions admixed with pigmented histiocytes.  There is  insufficient material for ancillary studies.  Excisional biopsy may be  of interest.   03/31/2021 -  Chemotherapy   Patient is on Treatment Plan : HEAD/NECK Nivolumab q14d        INTERVAL HISTORY:  Jeffrey [name] was contacted for a follow up of esophageal cancer. He was last seen by me on 11/04/21. He reports more fatigue, constipation, and nausea. He denies pain or discomfort.    All other systems were reviewed with the patient and are negative.  MEDICAL HISTORY:  Past Medical History:  Diagnosis Date   Anterior cerebral circulation hemorrhagic infarction [facility]) 11/24/2014   March, 2016, dominant left thalamic and left internal capsule ischemic infarct with resultant hemorrhagic transformation, secondary to small vessel disease. Resulted in right hemiparesis dysarthria and diplopia   //  readmission with aphasia July, 2016, this improved    Arthritis    CAD (coronary artery disease)    PCI distal RCA ...2004,  residual 70% LAD   /   ...nuclear...03/2007...no ischemia.Marland KitchenMarland Kitchenpreserved LV /  nuclear...03/03/2009...inferior scar..no ischemia..EF 51%   Cancer ([place])    Cyst of nasopharynx    per ENT Wilburn Cornelia   Dyslipidemia    takes Atorvastatin daily   GERD (gastroesophageal reflux disease)    takes Omeprazole daily   Gout    takes Allopurinol daily and Colchicine as needed   HCAP (healthcare-associated pneumonia)    Hemiparesis affecting right side as late effect of cerebrovascular accident ([place]) 09/05/2014   History of colon polyps    HTN (hypertension)    takes Cardura,Metoprolol,[name],and Amlodipine daily   Hyperlipidemia    Internal hemorrhoids    Myocardial infarction [facility]) 34yr ago   Peripheral edema    takes Lasix daily   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    SURGICAL HISTORY: Past Surgical History:  Procedure Laterality Date   BALLOON ANGIOPLASTY, ARTERY  1992, 2004   CAD, Dr. KRon Parker  CARDIAC CATHETERIZATION  2004   CATARACT EXTRACTION Bilateral 2010   COLONOSCOPY  04/03/2002   adenomatous polyp, int hemorrhoids   COLONOSCOPY  07/18/2007   normal (Dr. SFuller Plan   COLONOSCOPY  09/26/2012   tubular adenoma, sm int hem, rpt 5 yrs (Fuller Plan   ELBOW SURGERY Right 1997   eye lids raised     NASAL SEPTUM SURGERY     POLYPECTOMY N/A 05/27/2013   Procedure: ENDOSCOPIC NASOPHARYNGEAL MASS;  Surgeon: DJerrell Belfast MD   ROTATOR CUFF REPAIR  09/2011   left, with subacromial decompression   TONSILLECTOMY AND ADENOIDECTOMY      I have reviewed the social history and family history with the patient and they are unchanged from previous note.  ALLERGIES:  is allergic to niacin.  MEDICATIONS:  Current Outpatient Medications  Medication Sig Dispense  Refill   atorvastatin (LIPITOR) 80 MG tablet Take 80 mg by mouth at bedtime.     baclofen (LIORESAL) 10 MG tablet Take 10 mg by mouth 3 (three) times daily.     capecitabine (XELODA) 500 MG tablet Take 3 tablets (1500 mg total) by mouth every 12 hours for 7 days, then off for 7 days. Take within 30 minutes after a meal. 42 tablet 1   cholecalciferol (VITAMIN D3) 25 MCG (1000 UNIT) tablet Take 1,000 Units by mouth daily.     clopidogrel (PLAVIX) 75 MG tablet Take 1 tablet (75 mg total) by mouth daily.     empagliflozin (JARDIANCE) 25 MG TABS tablet Take 25 mg by mouth daily.     fluconazole (DIFLUCAN) 50 MG tablet Take 2 tablets (100 mg total) by mouth daily. 20 tablet 0   fosinopril ([name]) 40 MG tablet Take 1 tablet (40 mg total) by mouth daily.     furosemide (LASIX) 20 MG tablet Take 20-40 mg by mouth See admin instructions. Take 20 mg on Mon, Wed, Fri, and Sun. Take 40 mg on Tues, Thurs, and Sat     insulin aspart (NOVOLOG FLEXPEN) 100 UNIT/ML FlexPen Inject 10 Units into the skin 2 (two) times daily with a meal. Breakfast and dinner  insulin glargine (LANTUS) 100 UNIT/ML Solostar Pen Inject 20 Units into the skin at bedtime.     LACTOBACILLUS PO Take 1 capsule by mouth every Monday, Wednesday, and Friday.     loratadine (CLARITIN) 10 MG tablet Take 10 mg by mouth.     megestrol (MEGACE ES) 625 MG/5ML suspension TAKE 5 MLS (625 MG TOTAL) BY MOUTH DAILY. 450 mL 1   metoprolol tartrate (LOPRESSOR) 25 MG tablet Take 1 tablet (25 mg total) by mouth 2 (two) times daily.     mirabegron ER (MYRBETRIQ) 25 MG TB24 tablet Take 1 tablet (25 mg total) by mouth daily. 30 tablet 3   Olopatadine HCl (PATADAY OP) Place 1 drop into both eyes daily as needed (allergies).     ondansetron (ZOFRAN) 8 MG tablet Take 1 tablet (8 mg total) by mouth every 8 (eight) hours as needed for nausea or vomiting. 30 tablet 1   ondansetron (ZOFRAN-ODT) 4 MG disintegrating tablet Take 1 tablet (4 mg total) by mouth every 8  (eight) hours as needed for nausea or vomiting. 60 tablet 1   pantoprazole (PROTONIX) 20 MG tablet TAKE 1 TABLET BY MOUTH EVERY DAY 90 tablet 0   Potassium 99 MG TABS Take 1 tablet (99 mg total) by mouth daily as needed (when you take 2 lasix pills).     sertraline (ZOLOFT) 25 MG tablet Take 1 tablet (25 mg total) by mouth daily.     No current facility-administered medications for this visit.    PHYSICAL EXAMINATION: ECOG PERFORMANCE STATUS: 2 - Symptomatic, <50% confined to bed  There were no vitals filed for this visit. Wt Readings from Last 3 Encounters:  11/04/21 143 lb 4.8 oz (65 kg)  10/07/21 145 lb 1 oz (65.8 kg)  09/28/21 145 lb (65.8 kg)     No vitals taken today, Exam not performed today  LABORATORY DATA:  I have reviewed the data as listed    Latest Ref Rng & Units 11/04/2021   10:10 AM 10/07/2021   10:52 AM 09/02/2021   10:19 AM  CBC  WBC 4.0 - 10.5 K/uL 5.8  5.3  6.3   Hemoglobin 13.0 - 17.0 g/dL 13.7  14.0  13.2   Hematocrit 39.0 - 52.0 % 41.0  41.9  39.5   Platelets 150 - 400 K/uL 134  124  172         Latest Ref Rng & Units 11/04/2021   10:10 AM 10/07/2021   10:52 AM 09/02/2021   10:19 AM  CMP  Glucose 70 - 99 mg/dL 180  202  325   BUN 8 - 23 mg/dL _0 Creatinine 0.61 - 1.24 mg/dL 0.93  0.85  0.90   Sodium 135 - 145 mmol/L 141  139  138   Potassium 3.5 - 5.1 mmol/L 4.0  3.7  3.8   Chloride 98 - 111 mmol/L 104  104  105   CO2 22 - 32 mmol/L 33  29  26   Calcium 8.9 - 10.3 mg/dL 9.3  9.0  9.2   Total Protein 6.5 - 8.1 g/dL 6.5  6.7  6.7   Total Bilirubin 0.3 - 1.2 mg/dL 0.6  0.7  0.6   Alkaline Phos 38 - 126 U/L 165  178  137   AST 15 - 41 U/L _1 ALT 0 - 44 U/L _2 RADIOGRAPHIC STUDIES: I have  personally reviewed the radiological images as listed and agreed with the findings in the report. No results found.    Orders Placed This Encounter  Procedures   Amb Referral to Palliative Care    Referral Priority:    Routine    Referral Type:   Consultation    Number of Visits Requested:   1   All questions were answered. The patient knows to call the clinic with any problems, questions or concerns. No barriers to learning was detected. The total time spent in the appointment was 15 minutes.     Truitt Merle, MD 11/29/2021   I, Wilburn Mylar, am acting as scribe for Truitt Merle, MD.   I have reviewed the above documentation for accuracy and completeness, and I agree with the above.

## 2021-12-01 ENCOUNTER — Telehealth: Payer: Self-pay | Admitting: Nurse Practitioner

## 2021-12-01 NOTE — Telephone Encounter (Signed)
Attempted to contact patient's daughter to schedule Palliative Consult, no answer - left VM requesting a return call.

## 2021-12-06 ENCOUNTER — Ambulatory Visit (INDEPENDENT_AMBULATORY_CARE_PROVIDER_SITE_OTHER): Payer: PPO | Admitting: Podiatry

## 2021-12-06 ENCOUNTER — Encounter: Payer: Self-pay | Admitting: Podiatry

## 2021-12-06 DIAGNOSIS — M79674 Pain in right toe(s): Secondary | ICD-10-CM | POA: Diagnosis not present

## 2021-12-06 DIAGNOSIS — E1142 Type 2 diabetes mellitus with diabetic polyneuropathy: Secondary | ICD-10-CM | POA: Diagnosis not present

## 2021-12-06 DIAGNOSIS — B351 Tinea unguium: Secondary | ICD-10-CM

## 2021-12-06 DIAGNOSIS — M79675 Pain in left toe(s): Secondary | ICD-10-CM

## 2021-12-07 ENCOUNTER — Telehealth: Payer: Self-pay

## 2021-12-07 ENCOUNTER — Other Ambulatory Visit: Payer: Self-pay

## 2021-12-07 ENCOUNTER — Inpatient Hospital Stay: Payer: PPO | Attending: Nurse Practitioner | Admitting: Nurse Practitioner

## 2021-12-07 DIAGNOSIS — G893 Neoplasm related pain (acute) (chronic): Secondary | ICD-10-CM

## 2021-12-07 DIAGNOSIS — Z87891 Personal history of nicotine dependence: Secondary | ICD-10-CM | POA: Diagnosis not present

## 2021-12-07 DIAGNOSIS — Z515 Encounter for palliative care: Secondary | ICD-10-CM

## 2021-12-07 DIAGNOSIS — R630 Anorexia: Secondary | ICD-10-CM

## 2021-12-07 DIAGNOSIS — I10 Essential (primary) hypertension: Secondary | ICD-10-CM | POA: Diagnosis not present

## 2021-12-07 DIAGNOSIS — K5900 Constipation, unspecified: Secondary | ICD-10-CM

## 2021-12-07 DIAGNOSIS — C158 Malignant neoplasm of overlapping sites of esophagus: Secondary | ICD-10-CM | POA: Diagnosis not present

## 2021-12-07 DIAGNOSIS — R531 Weakness: Secondary | ICD-10-CM

## 2021-12-07 DIAGNOSIS — C160 Malignant neoplasm of cardia: Secondary | ICD-10-CM | POA: Insufficient documentation

## 2021-12-07 DIAGNOSIS — Z7189 Other specified counseling: Secondary | ICD-10-CM

## 2021-12-07 MED ORDER — TRAMADOL HCL 50 MG PO TABS: 50.0000 mg | ORAL_TABLET | Freq: Four times a day (QID) | ORAL | 0 refills | Status: DC | PRN

## 2021-12-07 NOTE — Progress Notes (Unsigned)
[place]  Telephone:([phone]) [phone] Fax:(336) [phone]   Name: SAIFAN RAYFORD Date: 12/07/2021 MRN: [id number]  DOB: Jul 05, 1940  Patient Care Team: [name], MD as PCP - General (Family Medicine) Jerline Pain, MD as PCP - Cardiology (Cardiology) Ladene Artist, MD as Consulting Physician (Gastroenterology) Alla Feeling, NP as Nurse Practitioner (Nurse Practitioner) Truitt Merle, MD as Consulting Physician (Oncology) Delrae Rend, MD as Consulting Physician (Endocrinology) [facility] as Consulting Physician [facility] and Palliative Medicine)   I connected with [name] on 12/07/21 at  2:30 PM EDT by '@[email or web address]'$ @ and verified that I am speaking with the correct person using two identifiers.   I discussed the limitations, risks, security and privacy concerns of performing an evaluation and management service by telemedicine and the availability of in-person appointments. I also discussed with the patient that there may be a patient responsible charge related to this service. The patient expressed understanding and agreed to proceed.   Other persons participating in the visit and their role in the encounter: Clemmie Krill, daughter and Maygan, RN    Patient's location: Home   Provider's location: [place] CONSULTATION: ALON MAZOR is a 81 y.o. 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  Palliative ask to see for symptom management and goals of care.    SOCIAL HISTORY:     reports that he has quit smoking. He has never used smokeless tobacco. He reports that he does not drink alcohol and does not use drugs.  ADVANCE DIRECTIVES:  Patient has documented advanced directive.   CODE STATUS:   PAST MEDICAL HISTORY: Past Medical History:  Diagnosis Date   Anterior  cerebral circulation hemorrhagic infarction ([place]) 11/24/2014   March, 2016, dominant left thalamic and left internal capsule ischemic infarct with resultant hemorrhagic transformation, secondary to small vessel disease. Resulted in right hemiparesis dysarthria and diplopia   //   readmission with aphasia July, 2016, this improved    Arthritis    CAD (coronary artery disease)    PCI distal RCA ...2004, residual 70% LAD   /   ...nuclear...03/2007...no ischemia.Marland KitchenMarland Kitchenpreserved LV /  nuclear...03/03/2009...inferior scar..no ischemia..EF 51%   Cancer ([place])    Cyst of nasopharynx    per ENT Wilburn Cornelia   Dyslipidemia    takes Atorvastatin daily   GERD (gastroesophageal reflux disease)    takes Omeprazole daily   Gout    takes Allopurinol daily and Colchicine as needed   HCAP (healthcare-associated pneumonia)    Hemiparesis affecting right side as late effect of cerebrovascular accident ([place]) 09/05/2014   History of colon polyps    HTN (hypertension)    takes Cardura,Metoprolol,[name],and Amlodipine daily   Hyperlipidemia    Internal hemorrhoids    Myocardial infarction [facility]) 66yr ago   Peripheral edema    takes Lasix daily   Pharyngeal or nasopharyngeal cyst 05/05/2013   with chronic hoarseness s/p excision   Right carotid bruit    Seasonal allergic rhinitis    [place]    Type 2 diabetes, uncontrolled, with neuropathy 03/08/1987   takes Invokana daily and has an insulin pump ([name]    PAST SURGICAL HISTORY:  Past Surgical History:  Procedure Laterality Date   BALLOON ANGIOPLASTY, ARTERY  1992, 2004   CAD, Dr. KRon Parker  CARDIAC CATHETERIZATION  2004   CATARACT EXTRACTION Bilateral 2010   COLONOSCOPY  04/03/2002   adenomatous polyp, int hemorrhoids   COLONOSCOPY  07/18/2007   normal (Dr. Fuller Plan)   COLONOSCOPY  09/26/2012   tubular adenoma, sm int hem, rpt 5 yrs Fuller Plan)   ELBOW SURGERY Right 1997   eye lids raised     NASAL SEPTUM SURGERY     POLYPECTOMY N/A 05/27/2013    Procedure: ENDOSCOPIC NASOPHARYNGEAL MASS;  Surgeon: [name], MD   ROTATOR CUFF REPAIR  09/2011   left, with subacromial decompression   TONSILLECTOMY AND ADENOIDECTOMY      HEMATOLOGY/ONCOLOGY HISTORY:  Oncology History Overview Note  Cancer Staging Esophageal cancer [facility]) Staging form: Esophagus - Adenocarcinoma, AJCC 8th Edition - Clinical stage from 12/31/2020: Stage IIA (cT1, cN1, cM0, GX) - Unsigned    Esophageal cancer ([place])  10/22/2020 Imaging   CT neck IMPRESSION: 2.8 cm right level 2A lymph node, which appears to be centrally necrotic, concerning for metastatic disease. No additional lymphadenopathy or other acute process in the neck.     11/30/2020 Imaging   Barium swallow FINDINGS: Limitations on positioning due to sequelae of stroke. The patient swallowed barium without difficulty. No aspiration. There is no mass or stricture. Normal motility. There is no hiatal hernia. No spontaneous gastroesophageal reflux. Barium pill was swallowed and passed without difficulty.   IMPRESSION: No significant abnormality.   12/08/2020 Imaging   CT chest abdomen with contrast IMPRESSION: 1. Mass is suspected just at the level of the GE junction. Suspicious for primary esophageal neoplasm. Further evaluation with endoscopy is advised. 2. Prominent perigastric lymph nodes are identified, suspicious for metastatic adenopathy. 3. Gallstone. 4. Coronary artery calcifications. 5. Aortic Atherosclerosis (ICD10-I70.0) and Emphysema (ICD10-J43.9). 6. These results will be called to the ordering clinician or representative by the Radiologist Assistant, and communication documented in the PACS or [employer].     12/17/2020 Procedure   EGD by Dr. Fuller Plan - A medium-sized, fungating mass with contact bleeding was found in the distal esophagus, 36 cm from the incisors extending to 41 cm in the cardia. The mass was partially obstructing and circumferential.   12/17/2020  Initial Biopsy   Diagnosis Esophagus, biopsy, distal and gastric cardia mass - ADENOCARCINOMA WITH SIGNET RING FEATURES.   12/19/2020 Initial Diagnosis   Esophageal cancer ([place])   12/30/2020 PET scan   IMPRESSION: 1. Mass at the level of the GE junction and proximal stomach is FDG avid within SUV max of 7.62. Findings compatible with primary gastric neoplasm. 2. There is mild FDG uptake associated with borderline gastrohepatic ligament lymph nodes which have an SUV max of 2.97. Equivocal for nodal metastasis. 3. No signs of distant metastatic disease. 4. Aortic Atherosclerosis (ICD10-I70.0) and Emphysema (ICD10-J43.9). Coronary artery calcifications. 5. Gallstones. 6. Prostate gland enlargement.   01/08/2021 Imaging   EXAM: ULTRASOUND OF HEAD/NECK SOFT TISSUES  IMPRESSION: Sonographic evaluation of the right-side of the neck confirms a minimally complex cystic lesion with broad differential considerations including cystic/necrotic cervical lymphadenopathy (favored given presence of an additional smaller apparently partially cystic right cervical lymph node), compatible with the findings on preceding PET-CT.   This minimally complex cystic structure/mass could undergo ultrasound-guided biopsy/aspiration as indicated.   01/18/2021 - 02/22/2021 Chemotherapy   Patient is on Treatment Plan : ESOPHAGUS Carboplatin/PACLitaxel weekly x 6 weeks with XRT       01/29/2021 Pathology Results   A. RIGHT, SUBMANDIBULAR CYST, FINE NEEDLE  ASPIRATION:   FINAL MICROSCOPIC DIAGNOSIS:  - Atypical cells present DIAGNOSTIC COMMENTS:  There are clusters of epithelioid cells with nuclear  grooves and  pseudoinclusions admixed with pigmented histiocytes.  There is  insufficient material for ancillary studies.  Excisional biopsy may be  of interest.   03/31/2021 -  Chemotherapy   Patient is on Treatment Plan : HEAD/NECK Nivolumab q14d       ALLERGIES:  is allergic to niacin.  MEDICATIONS:   Current Outpatient Medications  Medication Sig Dispense Refill   atorvastatin (LIPITOR) 80 MG tablet Take 80 mg by mouth at bedtime.     baclofen (LIORESAL) 10 MG tablet Take 10 mg by mouth 3 (three) times daily.     capecitabine (XELODA) 500 MG tablet Take 3 tablets (1500 mg total) by mouth every 12 hours for 7 days, then off for 7 days. Take within 30 minutes after a meal. (Patient not taking: Reported on 12/06/2021) 42 tablet 1   cholecalciferol (VITAMIN D3) 25 MCG (1000 UNIT) tablet Take 1,000 Units by mouth daily.     clopidogrel (PLAVIX) 75 MG tablet Take 1 tablet (75 mg total) by mouth daily.     empagliflozin (JARDIANCE) 25 MG TABS tablet Take 25 mg by mouth daily.     fluconazole (DIFLUCAN) 50 MG tablet Take 2 tablets (100 mg total) by mouth daily. 20 tablet 0   fosinopril ([name]) 40 MG tablet Take 1 tablet (40 mg total) by mouth daily.     furosemide (LASIX) 20 MG tablet Take 20-40 mg by mouth See admin instructions. Take 20 mg on Mon, Wed, Fri, and Sun. Take 40 mg on Tues, Thurs, and Sat     insulin aspart (NOVOLOG FLEXPEN) 100 UNIT/ML FlexPen Inject 10 Units into the skin 2 (two) times daily with a meal. Breakfast and dinner     insulin glargine (LANTUS) 100 UNIT/ML Solostar Pen Inject 20 Units into the skin at bedtime.     LACTOBACILLUS PO Take 1 capsule by mouth every Monday, Wednesday, and Friday.     loratadine (CLARITIN) 10 MG tablet Take 10 mg by mouth.     megestrol (MEGACE ES) 625 MG/5ML suspension TAKE 5 MLS (625 MG TOTAL) BY MOUTH DAILY. (Patient not taking: Reported on 12/06/2021) 450 mL 1   metoprolol tartrate (LOPRESSOR) 25 MG tablet Take 1 tablet (25 mg total) by mouth 2 (two) times daily.     mirabegron ER (MYRBETRIQ) 25 MG TB24 tablet Take 1 tablet (25 mg total) by mouth daily. 30 tablet 3   Olopatadine HCl (PATADAY OP) Place 1 drop into both eyes daily as needed (allergies).     ondansetron (ZOFRAN) 8 MG tablet Take 1 tablet (8 mg total) by mouth every 8 (eight)  hours as needed for nausea or vomiting. 30 tablet 1   ondansetron (ZOFRAN-ODT) 4 MG disintegrating tablet Take 1 tablet (4 mg total) by mouth every 8 (eight) hours as needed for nausea or vomiting. (Patient not taking: Reported on 12/06/2021) 60 tablet 1   pantoprazole (PROTONIX) 20 MG tablet TAKE 1 TABLET BY MOUTH EVERY DAY 90 tablet 0   Potassium 99 MG TABS Take 1 tablet (99 mg total) by mouth daily as needed (when you take 2 lasix pills).     sertraline (ZOLOFT) 25 MG tablet Take 1 tablet (25 mg total) by mouth daily.     traMADol (ULTRAM) 50 MG tablet Take 1 tablet (50 mg total) by mouth every 6 (six) hours as needed for moderate pain or severe pain. 30 tablet 0   No current facility-administered medications for this visit.    VITAL SIGNS: There were no vitals  taken for this visit. There were no vitals filed for this visit.  Estimated body mass index is 21.79 kg/m as calculated from the following:   Height as of 11/04/21: '5\' 8"'$  (1.727 m).   Weight as of 11/04/21: 143 lb 4.8 oz (65 kg).  LABS: CBC:    Component Value Date/Time   WBC 5.8 11/04/2021 1010   HGB 13.7 11/04/2021 1010   HGB 12.8 (L) 08/06/2021 0934   HCT 41.0 11/04/2021 1010   PLT 134 (L) 11/04/2021 1010   PLT 145 (L) 08/06/2021 0934   MCV 88.4 11/04/2021 1010   NEUTROABS 4.3 11/04/2021 1010   LYMPHSABS 0.8 11/04/2021 1010   MONOABS 0.5 11/04/2021 1010   EOSABS 0.1 11/04/2021 1010   BASOSABS 0.0 11/04/2021 1010   Comprehensive Metabolic Panel:    Component Value Date/Time   NA 141 11/04/2021 1010   NA 143 07/26/2017 0000   K 4.0 11/04/2021 1010   K 3.6 02/01/2011 0000   K 3.6 02/01/2011 0000   CL 104 11/04/2021 1010   CL 102 04/19/2010 0000   CO2 33 (H) 11/04/2021 1010   CO2 28 04/19/2010 0000   BUN 19 11/04/2021 1010   BUN 12 07/14/2014 0000   CREATININE 0.93 11/04/2021 1010   CREATININE 0.96 08/06/2021 0934   CREATININE 1.24 09/30/2013 0000   CREATININE 1.24 09/30/2013 0000   GLUCOSE 180 (H)  11/04/2021 1010   CALCIUM 9.3 11/04/2021 1010   CALCIUM 9.7 02/01/2011 0000   CALCIUM 9.7 02/01/2011 0000   AST 17 11/04/2021 1010   AST 16 08/06/2021 0934   ALT 18 11/04/2021 1010   ALT 19 08/06/2021 0934   ALT 34 09/30/2013 0000   ALT 34 09/30/2013 0000   ALKPHOS 165 (H) 11/04/2021 1010   ALKPHOS 132 09/30/2013 0000   ALKPHOS 132 09/30/2013 0000   BILITOT 0.6 11/04/2021 1010   BILITOT 0.7 08/06/2021 0934   PROT 6.5 11/04/2021 1010   PROT 6.8 04/19/2010 0000   ALBUMIN 3.7 11/04/2021 1010   ALBUMIN 4.2 04/19/2010 0000    RADIOGRAPHIC STUDIES: No results found.  PERFORMANCE STATUS (ECOG) : 2 - Symptomatic, <50% confined to bed  Review of Systems  Constitutional:  Positive for appetite change.  Musculoskeletal:  Positive for arthralgias.       Right side hemiparesis s/p CVA   Neurological:  Positive for weakness.  Unless otherwise noted, a complete review of systems is negative.   IMPRESSION: This is my initial visit with Mr. Bierlein. His daughter, [name] is also present for the call. Patient also has a pending home visit with outpatient Palliative ([facility]).   I introduced myself, Maygan RN, and Palliative's role in collaboration with the oncology team. Concept of Palliative Care was introduced as specialized medical care for people and their families living with serious illness.  It focuses on providing relief from the symptoms and stress of a serious illness.  The goal is to improve quality of life for both the patient and the family. Values and goals of care important to patient and family were attempted to be elicited.   Mr. Pelfrey lives in the home alone. He has 2 children, his son Jenny Reichmann lives out of state). Mr. Schrieber is a Naval architect.   Deckard is able to perform some ADLs independently. He is wheelchair bound due to previous stroke with residual right sided weakness. He has bathroom bars for support. Appetite is minimal as he has difficulty with swallowing. He  is able to swallow some liquids  and soft foods. Poor sleep pattern at night due to pain and toileting.   Neoplams related pain Mr. Krisko complains of neck pain which sometimes radiates across back and shoulder area. He has been taking Tylenol ES with little to no relief. He is open to considering prescription medication. He does not want to be drowsy given he cares for himself at this time.   Detailed discussions and education provided on use of tramadol. Allergies reviewed. He and daughter verbalized understanding and would like to try medication to allow for improvement in his pain and quality of life. Education provided on use, frequency, and potential side effects.   We will continue to closely monitor and adjust as needed.   Constipation  Ongoing concerns with constipation. Daughter states he was previously on Miralax and Senna without relief. He is currently taking Ex-Lax daily which is effective. We discussed risk of increased constipation with use of tramadol. Encouraged daily bowel regimen.   Goals of Care We discussed his current illness and what it means in the larger context of Her on-going co-morbidities. Natural disease trajectory and expectations were discussed.  Mr. Starnes and his daughter are clear in expressed goals to continue to manage symptoms and allowing him to continue to thrive as long as possible. He has made the decision for no further chemotherapy/radiation treatments in regards to his cancer.   He has an active advanced directive on file. This has been reviewed.   Will consider completion of MOST form in the future if not done by [facility] staff.   I discussed the importance of continued conversation with family and their medical providers regarding overall plan of care and treatment options, ensuring decisions are within the context of the patients values and GOCs.  PLAN: Established therapeutic relationship. Education provided on palliative's role in collaboration  with their Oncology/Radiation team. Tramadol 50 mg every 6-8 hours as needed for pain Daily bowel regimen Will continue to closely monitor pain and adjust regimen as needed.  Patient and daughter clear in expressed goals to focus on patient's comfort and quality of life for as long as possible. He is not pursuing additional Oncology treatments. Active advanced directive on file.  I will plan to see patient back in 2 weeks via virtual visit.    Patient expressed understanding and was in agreement with this plan. He also understands that He can call the clinic at any time with any questions, concerns, or complaints.   Thank you for your referral and allowing Palliative to assist in Mr. MARCELLOUS SNARSKI care.   Number and complexity of problems addressed: HIGH - 1 or more chronic illnesses with SEVERE exacerbation, progression, or side effects of treatment - advanced cancer, pain. Any controlled substances utilized were prescribed in the context of palliative care.  Time Total: 50 min   Visit consisted of counseling and education dealing with the complex and emotionally intense issues of symptom management and palliative care in the setting of serious and potentially life-threatening illness.Greater than 50%  of this time was spent counseling and coordinating care related to the above assessment and plan.  Signed by: Alda Lea, AGPCNP-BC Palliative Medicine Team/[facility]

## 2021-12-07 NOTE — Telephone Encounter (Signed)
Spoke with Jeffrey [name] with [facility] to see if they are able to come visit the pt within his home today since the pt's daughter called the [place] stating that the patient is in extreme pain.  Misty stated that she would see if they can contact pt's daughter to see if they can come see the pt today to further assess his pain.  Jobe Gibbon, NP with [facility] will also have a telehealth visit with pt this afternoon as well to assess pt's pain since Dr. Burr Medico is out of the office today.

## 2021-12-08 ENCOUNTER — Encounter: Payer: Self-pay | Admitting: Hematology

## 2021-12-08 ENCOUNTER — Ambulatory Visit: Payer: PPO | Admitting: Hematology

## 2021-12-08 ENCOUNTER — Other Ambulatory Visit: Payer: PPO

## 2021-12-08 ENCOUNTER — Encounter: Payer: Self-pay | Admitting: Nurse Practitioner

## 2021-12-09 ENCOUNTER — Ambulatory Visit: Payer: Self-pay | Admitting: Nurse Practitioner

## 2021-12-09 ENCOUNTER — Encounter: Payer: Self-pay | Admitting: Nurse Practitioner

## 2021-12-09 ENCOUNTER — Other Ambulatory Visit (HOSPITAL_COMMUNITY): Payer: Self-pay

## 2021-12-09 ENCOUNTER — Other Ambulatory Visit: Payer: Self-pay | Admitting: Nurse Practitioner

## 2021-12-09 ENCOUNTER — Other Ambulatory Visit: Payer: Self-pay

## 2021-12-09 DIAGNOSIS — Z515 Encounter for palliative care: Secondary | ICD-10-CM | POA: Diagnosis not present

## 2021-12-09 DIAGNOSIS — R630 Anorexia: Secondary | ICD-10-CM

## 2021-12-09 DIAGNOSIS — C158 Malignant neoplasm of overlapping sites of esophagus: Secondary | ICD-10-CM

## 2021-12-09 DIAGNOSIS — G893 Neoplasm related pain (acute) (chronic): Secondary | ICD-10-CM

## 2021-12-09 NOTE — Progress Notes (Signed)
  Subjective:  Patient ID: Jeffrey [name], male    DOB: [birth year],  MRN: [id number]  Jeffrey [name] presents to clinic today for:  Chief Complaint  Patient presents with   Nail Problem    Diabetic foot care [id number] A1C-Do not know PCP-[facility] PCP VST-     New problem(s): None.   He is accompanied by his daughter, on today's visit.  PCP is [name], MD , and last visit was  October 01, 2021.  Allergies  Allergen Reactions   Niacin Other (See Comments)    flushed skin/rash     Review of Systems: Negative except as noted in the HPI.  Objective: No changes noted in today's physical examination.  Jeffrey [name] is a pleasant 81 y.o. male in NAD. AAO x 3.  Vascular Examination: CFT <3 seconds b/l LE. Faintly palpable DP pulses b/l LE. Diminished PT pulse(s) b/l LE. Pedal hair absent. No pain with calf compression b/l. Lower extremity skin temperature gradient within normal limits. Trace edema noted BLE. No cyanosis or clubbing noted b/l LE.  Dermatological Examination: Pedal skin thin, shiny and atrophic b/l LE. No open wounds b/l LE. No interdigital macerations noted b/l LE. Toenails 1-5 b/l elongated, discolored, dystrophic, thickened, crumbly with subungual debris and tenderness to dorsal palpation. No hyperkeratotic nor porokeratotic lesions present on today's visit.  Neurological Examination: Pt has subjective symptoms of neuropathy. Protective sensation intact 5/5 intact bilaterally with 10g monofilament b/l. Vibratory sensation intact b/l.  Musculoskeletal Examination: Muscle strength 5/5 to all LE muscle groups of left lower extremity.  Hemiparesis of RLE post CVA. Utilizes wheelchair for mobility assistance.  Assessment/Plan: 1. Pain due to onychomycosis of toenails of both feet   2. Diabetic peripheral neuropathy associated with type 2 diabetes mellitus ([place])     No orders of the defined types were placed in this encounter.   -Consent given for  treatment as described below: -Examined patient. -Patient to continue soft, supportive shoe gear daily. -Toenails 1-5 b/l were debrided in length and girth with sterile nail nippers and dremel without iatrogenic bleeding.  -Patient/POA to call should there be question/concern in the interim.   Return in about 3 months (around 03/08/2022).  Marzetta Board, DPM

## 2021-12-09 NOTE — Progress Notes (Addendum)
[facility] Palliative Care Consult Note Telephone: [phone]  Fax: [phone]   Date of encounter: 12/09/21 8:17 PM PATIENT NAME: Jeffrey [name] [address] [place] [address]   [phone] (home)  DOB: February 16, 1941 MRN: [id number] PRIMARY CARE PROVIDER:    [name], MD,  [place] [address] [phone]  REFERRING PROVIDER:   [name], MD [address] [place],  [place] [address] [phone]  RESPONSIBLE PARTY:    Contact Information     Name Relation Home Work [place] Daughter [phone]  [phone]   [place], [place] [phone]  [phone]      I connected with Daughter [name] with  RYLYN RANGANATHAN on 12/09/21 by telephone as video not available enabled telemedicine application and verified that I am speaking with the correct person using two identifiers.   I discussed the limitations of evaluation and management by telemedicine. The patient expressed understanding and agreed to proceed. Palliative Care was asked to follow this patient by consultation request of  [name], MD to address advance care planning and complex medical decision making. This is the initial visit.                                 ASSESSMENT AND PLAN / RECOMMENDATIONS:  Symptom Management/Plan: 1. Advance Care Planning;  Ongoing discussions; wishes are to focus on comfort, no further chemo/radiation. We talked about option of Hospice under Medicare benefit, services discussed. Mr. Chamberlin wishes to proceed with Hospice services, notified Dr Burr Medico and Dr Danise Mina, order received.   2. Goals of Care: Goals include to maximize quality of life and symptom management. Our advance care planning conversation included a discussion about:    The value and importance of advance care planning  Exploration of personal, cultural or spiritual beliefs that might influence medical  decisions  Exploration of goals of care in the event of a sudden injury or illness  Identification and preparation of a healthcare agent  Review and updating or creation of an advance directive document.  3. 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 Nixon in agreement Regular life time weight 190's 10/01/2021 weight 175 lbs 01/18/2022 weight 159 lbs 11/04/2021 weight 143 lbs last week weight 137 lbs reported by [name] daughter  4. Palliative care encounter; Palliative care encounter; Palliative medicine team will continue to support patient, patient's family, and medical team. Visit consisted of counseling and education dealing with the complex and emotionally intense issues of symptom management and palliative care in the setting of serious and potentially life-threatening illness I spent 35 minutes providing this consultation. More than 50% of the time in this consultation was spent in counseling and care coordination. PPS: 40% Chief Complaint: Initial palliative consult for complex medical decision making, address goals, manage ongoing symptoms  HISTORY OF PRESENT ILLNESS:  [name] is a 81 y.o. year old male  with multiple medical problems including  Adenocarcinoma of the GE junction, cTX N1 M0, HER2 negative, MMR normal, PD-L1 CPS 3%, pulmonary metastasis in 10/2021, dysphagia, anterior cerebral hemorrhage, dominate left thalamic left internal capsule ischemic infarct, right hemiparesis dysarthria, diplopia, CAD s/p PCI, MI, dyslipidemia, GERD, GOUT, HTN, DM. I called [name], Mr Joesphine Bare daughter for telemedicine telephonic as video not available. We talked about Mr Remo living alone, able to perform his adl's, feed himself though very poor appetite. We talked  about marinol and restarting. We talked about nutrition, supplements. We talked about past medical history, We talked about cancer dx, treatments, medications and wishes not to  further pursue treatments. We talked about hospice benefit from Medicare. We talked about services provided. Mr  Arrants and [name] in agreement to proceed with hospice. Therapeutic listening, emotional support provided.   SUMMARY OF ONCOLOGIC HISTORY:     Oncology History Overview Note   Cancer Staging Esophageal cancer [facility]) Staging form: Esophagus - Adenocarcinoma, AJCC 8th Edition - Clinical stage from 12/31/2020: Stage IIA (cT1, cN1, cM0, GX) - Unsigned      Esophageal cancer ([place])   10/22/2020 Imaging     CT neck IMPRESSION: 2.8 cm right level 2A lymph node, which appears to be centrally necrotic, concerning for metastatic disease. No additional lymphadenopathy or other acute process in the neck.       11/30/2020 Imaging     Barium swallow FINDINGS: Limitations on positioning due to sequelae of stroke. The patient swallowed barium without difficulty. No aspiration. There is no mass or stricture. Normal motility. There is no hiatal hernia. No spontaneous gastroesophageal reflux. Barium pill was swallowed and passed without difficulty.   IMPRESSION: No significant abnormality.     12/08/2020 Imaging     CT chest abdomen with contrast IMPRESSION: 1. Mass is suspected just at the level of the GE junction. Suspicious for primary esophageal neoplasm. Further evaluation with endoscopy is advised. 2. Prominent perigastric lymph nodes are identified, suspicious for metastatic adenopathy. 3. Gallstone. 4. Coronary artery calcifications. 5. Aortic Atherosclerosis (ICD10-I70.0) and Emphysema (ICD10-J43.9). 6. These results will be called to the ordering clinician or representative by the Radiologist Assistant, and communication documented in the PACS or [employer].       12/17/2020 Procedure     EGD by Dr. Fuller Plan - A medium-sized, fungating mass with contact bleeding was found in the distal esophagus, 36 cm from the incisors extending to 41 cm in the cardia. The mass was  partially obstructing and circumferential.     12/17/2020 Initial Biopsy     Diagnosis Esophagus, biopsy, distal and gastric cardia mass - ADENOCARCINOMA WITH SIGNET RING FEATURES.     12/19/2020 Initial Diagnosis     Esophageal cancer ([place])     12/30/2020 PET scan     IMPRESSION: 1. Mass at the level of the GE junction and proximal stomach is FDG avid within SUV max of 7.62. Findings compatible with primary gastric neoplasm. 2. There is mild FDG uptake associated with borderline gastrohepatic ligament lymph nodes which have an SUV max of 2.97. Equivocal for nodal metastasis. 3. No signs of distant metastatic disease. 4. Aortic Atherosclerosis (ICD10-I70.0) and Emphysema (ICD10-J43.9). Coronary artery calcifications. 5. Gallstones. 6. Prostate gland enlargement.     01/08/2021 Imaging     EXAM: ULTRASOUND OF HEAD/NECK SOFT TISSUES   IMPRESSION: Sonographic evaluation of the right-side of the neck confirms a minimally complex cystic lesion with broad differential considerations including cystic/necrotic cervical lymphadenopathy (favored given presence of an additional smaller apparently partially cystic right cervical lymph node), compatible with the findings on preceding PET-CT.   This minimally complex cystic structure/mass could undergo ultrasound-guided biopsy/aspiration as indicated.     01/18/2021 - 02/22/2021 Chemotherapy     Patient is on Treatment Plan : ESOPHAGUS Carboplatin/PACLitaxel weekly x 6 weeks with XRT        01/29/2021 Pathology Results     A. RIGHT, SUBMANDIBULAR CYST, FINE NEEDLE  ASPIRATION:    FINAL MICROSCOPIC DIAGNOSIS:  -  Atypical cells present DIAGNOSTIC COMMENTS:  There are clusters of epithelioid cells with nuclear grooves and  pseudoinclusions admixed with pigmented histiocytes.  There is  insufficient material for ancillary studies.  Excisional biopsy may be  of interest.     03/31/2021 -  Chemotherapy     Patient is on Treatment Plan :  HEAD/NECK Nivolumab q14d           History obtained from review of EMR, discussion with primary team, and interview with family, facility staff/caregiver and/or Mr. Rohrman.  I reviewed available labs, medications, imaging, studies and related documents from the EMR.  Records reviewed and summarized above.   ROS 10 point system reviewed all negative except HPI,   Physical Exam: deferred CURRENT PROBLEM LIST:  Patient Active Problem List   Diagnosis Date Noted   Contracture, right hand 08/30/2021   Overactive bladder 08/27/2021   Sacral pressure sore 05/31/2021   Multiple falls 03/22/2021   Hematoma 03/22/2021   Esophageal cancer ([place]) 12/19/2020   Benign neoplasm of colon 11/06/2020   Benign prostatic hyperplasia 11/06/2020   Head and neck lymphadenopathy 10/14/2020   Esophageal dysphagia 10/14/2020   Balanitis 10/14/2020   Impaired mobility 04/17/2020   Trigger middle finger of left hand 11/06/2019   Urinary urgency 08/27/2019   Encounter for general adult medical examination with abnormal findings 07/27/2016   Hypertropia of right eye 03/22/2016   Medicare annual wellness visit, subsequent 07/23/2015   Advanced care planning/counseling discussion 07/23/2015   Diplopia 07/15/2015   Anterior cerebral circulation hemorrhagic infarction ([place]) 11/24/2014   Hemiparesis affecting right side as late effect of cerebrovascular accident ([place]) 09/05/2014   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 09/05/2014   Chronic diastolic heart failure, NYHA class 1 ([place]) 06/05/2014   Benign paroxysmal positional vertigo 04/28/2014   Right carotid bruit    Seasonal allergic rhinitis    Trigger thumb of left hand 10/12/2011   Dysphonia 01/04/2011   Gout    Adenomatous polyps    CAD (coronary artery disease)    Hypertension associated with diabetes ([place])    Hyperlipidemia associated with type 2 diabetes mellitus ([place])    Type 2 diabetes mellitus with neurological complications ([place])    Ejection fraction     DOE (dyspnea on exertion) 02/11/2009   Chronic ischemic heart disease 03/07/1990   PAST MEDICAL HISTORY:  Active Ambulatory Problems    Diagnosis Date Noted   DOE (dyspnea on exertion) 02/11/2009   Hypertension associated with diabetes ([place])    Hyperlipidemia associated with type 2 diabetes mellitus (H[facility])    Type 2 diabetes mellitus with neurological complications (H[facility])    Ejection fraction    CAD (coronary artery disease)    Gout    Adenomatous polyps    Trigger thumb of left hand 10/12/2011   Seasonal allergic rhinitis    Right carotid bruit    Benign paroxysmal positional vertigo 04/28/2014   Chronic diastolic heart failure, NYHA class 1 ([place]) 06/05/2014   Hemiparesis affecting right side as late effect of cerebrovascular accident ([place]) 09/05/2014   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 09/05/2014   Anterior cerebral circulation hemorrhagic infarction ([place]) 11/24/2014   Diplopia 07/15/2015   Medicare annual wellness visit, subsequent 07/23/2015   Advanced care planning/counseling discussion 07/23/2015   Encounter for general adult medical examination with abnormal findings 07/27/2016   Dysphonia 01/04/2011   Hypertropia of right eye 03/22/2016   Urinary urgency 08/27/2019   Trigger middle finger of left hand 11/06/2019   Impaired mobility 04/17/2020  Head and neck lymphadenopathy 10/14/2020   Esophageal dysphagia 10/14/2020   Balanitis 10/14/2020   Benign neoplasm of colon 11/06/2020   Benign prostatic hyperplasia 11/06/2020   Esophageal cancer ([place]) 12/19/2020   Multiple falls 03/22/2021   Hematoma 03/22/2021   Chronic ischemic heart disease 03/07/1990   Sacral pressure sore 05/31/2021   Overactive bladder 08/27/2021   Contracture, right hand 08/30/2021   Resolved Ambulatory Problems    Diagnosis Date Noted   HYPERTENSION, UNSPECIFIED 02/09/2009   Pre-syncope    Epigastric abdominal pain 11/11/2010   Acute chest wall pain 06/01/2011   Nontraumatic intracranial  hemorrhage ([place]) 05/30/2014   Ex-smoker 06/03/2014   Obesity 06/03/2014   Intracerebral hemorrhage ([place]) 06/04/2014   Acute respiratory failure with hypoxia ([place]) 06/05/2014   HCAP (healthcare-associated pneumonia) 06/05/2014   Adynamic ileus ([place]) 06/05/2014   Sepsis ([place]) 06/05/2014   Thrombocytopenia ([place]) 06/05/2014   Acute renal failure ([place]) 06/05/2014   Ileus of unspecified type ([place]) 06/05/2014   ICH (intracerebral hemorrhage) ([place]) 06/11/2014   Alterations of sensations following CVA (cerebrovascular accident) 06/18/2014   Dehydration 09/13/2014   Hypokalemia 09/13/2014   Aphasia 09/13/2014   Dysarthria 09/13/2014   Ear pain 03/23/2015   Left hand pain 07/27/2016   Acute pharyngitis 03/15/2017   Arm injury, right, initial encounter 06/05/2018   Injury of right leg 06/05/2018   DNR (do not resuscitate) 04/11/2019   Hyperlipidemia 11/06/2020   Cerebral infarction, unspecified ([place]) 03/08/1987   Dizziness 03/04/2021   Hypotension due to hypovolemia 03/04/2021   Nausea 03/04/2021   Type 2 diabetes mellitus with hyperglycemia ([place]) 05/21/2021   Leg wound, right, initial encounter 05/31/2021   Past Medical History:  Diagnosis Date   Arthritis    Cancer ([place])    Cyst of nasopharynx    Dyslipidemia    GERD (gastroesophageal reflux disease)    Gout    History of colon polyps    HTN (hypertension)    Internal hemorrhoids    Myocardial infarction [facility]) 66yr ago   Peripheral edema    Pharyngeal or nasopharyngeal cyst 05/05/2013   Stroke ([place]    Type 2 diabetes, uncontrolled, with neuropathy 03/08/1987   SOCIAL HX:  Social History   Tobacco Use   Smoking status: Former   Smokeless tobacco: Never   Tobacco comments:    quit smoking at age 81 Substance Use Topics   Alcohol use: No   FAMILY HX:  Family History  Problem Relation Age of Onset   Alcohol abuse Father    Heart attack Father    Cancer Sister 53      breast cancer   Heart attack Son     Coronary artery disease Neg Hx    Stroke Neg Hx    Diabetes Neg Hx    Colon cancer Neg Hx    Colon polyps Neg Hx    Stomach cancer Neg Hx    Rectal cancer Neg Hx       ALLERGIES:  Allergies  Allergen Reactions   Niacin Other (See Comments)    flushed skin/rash      PERTINENT MEDICATIONS:  Outpatient Encounter Medications as of 12/09/2021  Medication Sig   atorvastatin (LIPITOR) 80 MG tablet Take 80 mg by mouth at bedtime.   baclofen (LIORESAL) 10 MG tablet Take 10 mg by mouth 3 (three) times daily.   capecitabine (XELODA) 500 MG tablet Take 3 tablets (1500 mg total) by mouth every 12 hours for 7 days, then off for 7 days.  Take within 30 minutes after a meal. (Patient not taking: Reported on 12/06/2021)   cholecalciferol (VITAMIN D3) 25 MCG (1000 UNIT) tablet Take 1,000 Units by mouth daily.   clopidogrel (PLAVIX) 75 MG tablet Take 1 tablet (75 mg total) by mouth daily.   empagliflozin (JARDIANCE) 25 MG TABS tablet Take 25 mg by mouth daily.   fluconazole (DIFLUCAN) 50 MG tablet Take 2 tablets (100 mg total) by mouth daily.   fosinopril ([name]) 40 MG tablet Take 1 tablet (40 mg total) by mouth daily.   furosemide (LASIX) 20 MG tablet Take 20-40 mg by mouth See admin instructions. Take 20 mg on Mon, Wed, Fri, and Sun. Take 40 mg on Tues, Thurs, and Sat   insulin aspart (NOVOLOG FLEXPEN) 100 UNIT/ML FlexPen Inject 10 Units into the skin 2 (two) times daily with a meal. Breakfast and dinner   insulin glargine (LANTUS) 100 UNIT/ML Solostar Pen Inject 20 Units into the skin at bedtime.   LACTOBACILLUS PO Take 1 capsule by mouth every Monday, Wednesday, and Friday.   loratadine (CLARITIN) 10 MG tablet Take 10 mg by mouth.   megestrol (MEGACE ES) 625 MG/5ML suspension TAKE 5 MLS (625 MG TOTAL) BY MOUTH DAILY. (Patient not taking: Reported on 12/06/2021)   metoprolol tartrate (LOPRESSOR) 25 MG tablet Take 1 tablet (25 mg total) by mouth 2 (two) times daily.   mirabegron ER (MYRBETRIQ) 25 MG  TB24 tablet Take 1 tablet (25 mg total) by mouth daily.   Olopatadine HCl (PATADAY OP) Place 1 drop into both eyes daily as needed (allergies).   ondansetron (ZOFRAN) 8 MG tablet Take 1 tablet (8 mg total) by mouth every 8 (eight) hours as needed for nausea or vomiting.   ondansetron (ZOFRAN-ODT) 4 MG disintegrating tablet Take 1 tablet (4 mg total) by mouth every 8 (eight) hours as needed for nausea or vomiting. (Patient not taking: Reported on 12/06/2021)   pantoprazole (PROTONIX) 20 MG tablet TAKE 1 TABLET BY MOUTH EVERY DAY   Potassium 99 MG TABS Take 1 tablet (99 mg total) by mouth daily as needed (when you take 2 lasix pills).   sertraline (ZOLOFT) 25 MG tablet Take 1 tablet (25 mg total) by mouth daily.   traMADol (ULTRAM) 50 MG tablet Take 1 tablet (50 mg total) by mouth every 6 (six) hours as needed for moderate pain or severe pain.   [DISCONTINUED] prochlorperazine (COMPAZINE) 10 MG tablet Take 1 tablet (10 mg total) by mouth every 6 (six) hours as needed (Nausea or vomiting).   No facility-administered encounter medications on file as of 12/09/2021.   Thank you for the opportunity to participate in the care of Mr. Muzquiz.  The palliative care team will continue to follow. Please call our office at [phone] if we can be of additional assistance.   Tahara Ruffini Z Tregan Read, NP ,

## 2021-12-09 NOTE — Progress Notes (Signed)
Epic faxed orders to [facility] for referral.  Fax confirmation received.

## 2021-12-13 ENCOUNTER — Telehealth: Payer: Self-pay | Admitting: *Deleted

## 2021-12-13 NOTE — Telephone Encounter (Signed)
Received call from [place], [place] @ [facility] requesting verbal order for Senna-S since pt has not had BMs in 4 days.  Lexine Baton, NP Palliative care notified.  Verbal order given to [facility] for senna-s  for pt.  Toni's   phone   [phone].

## 2021-12-14 ENCOUNTER — Inpatient Hospital Stay: Payer: PPO

## 2021-12-14 ENCOUNTER — Telehealth: Payer: Self-pay | Admitting: Nurse Practitioner

## 2021-12-17 ENCOUNTER — Telehealth: Payer: Self-pay | Admitting: Family Medicine

## 2021-12-17 NOTE — Telephone Encounter (Signed)
Spoke with pt's daughter, [name] (on dpr), encouraging them to keep pt's f/u.  Verbalizes understanding and agrees to keep appt.

## 2021-12-17 NOTE — Telephone Encounter (Signed)
Pt's daughter, [name] called asking if the pt should keep his ov on 12/31/21 with Dr. Darnell Level in order to continue to refill meds? Call back # [phone]

## 2021-12-27 ENCOUNTER — Telehealth: Payer: Self-pay

## 2021-12-27 ENCOUNTER — Other Ambulatory Visit: Payer: Self-pay

## 2021-12-27 DIAGNOSIS — C158 Malignant neoplasm of overlapping sites of esophagus: Secondary | ICD-10-CM

## 2021-12-27 DIAGNOSIS — Z515 Encounter for palliative care: Secondary | ICD-10-CM

## 2021-12-27 DIAGNOSIS — G893 Neoplasm related pain (acute) (chronic): Secondary | ICD-10-CM

## 2021-12-27 MED ORDER — TRAMADOL HCL 50 MG PO TABS: 50.0000 mg | ORAL_TABLET | Freq: Four times a day (QID) | ORAL | 0 refills | Status: AC | PRN

## 2021-12-27 NOTE — Telephone Encounter (Signed)
[facility] called stating patient needs refill for Tramadol because he only has 2 left. This LPN stated this would be taken care of by ordering provider. No further questions or concerns.

## 2021-12-31 ENCOUNTER — Ambulatory Visit: Payer: PPO | Admitting: Family Medicine

## 2022-01-21 ENCOUNTER — Telehealth: Payer: Self-pay | Admitting: Family Medicine

## 2022-01-21 NOTE — Telephone Encounter (Signed)
Called daughter [name] to express my condolences.  Pt passed away earlier this week.  Unable to reach her. Will try again later.

## 2022-01-24 NOTE — Telephone Encounter (Signed)
Passed away last Sunday 2022/02/14.  He had decided against chemotherapy.  Quick deterioration in condition. Hospice was involved.  Expressed my condolences.

## 2022-02-01 ENCOUNTER — Telehealth: Payer: Self-pay | Admitting: Podiatry

## 2022-02-01 NOTE — Telephone Encounter (Signed)
Pts daughter called to cxl appt for 04-10-22 as pt has passed away

## 2022-02-02 NOTE — Telephone Encounter (Signed)
Thank you Dawn!

## 2022-02-04 DEATH — deceased

## 2022-03-11 IMAGING — CR DG CHEST 2V
3 series · 3 of 3 positions shown · non-contrast
Comparison: CT chest done on 12/01/2020

CLINICAL DATA: Shortness of breath

EXAM:
CHEST - 2 VIEW

[w chest lat (1 of 2)]
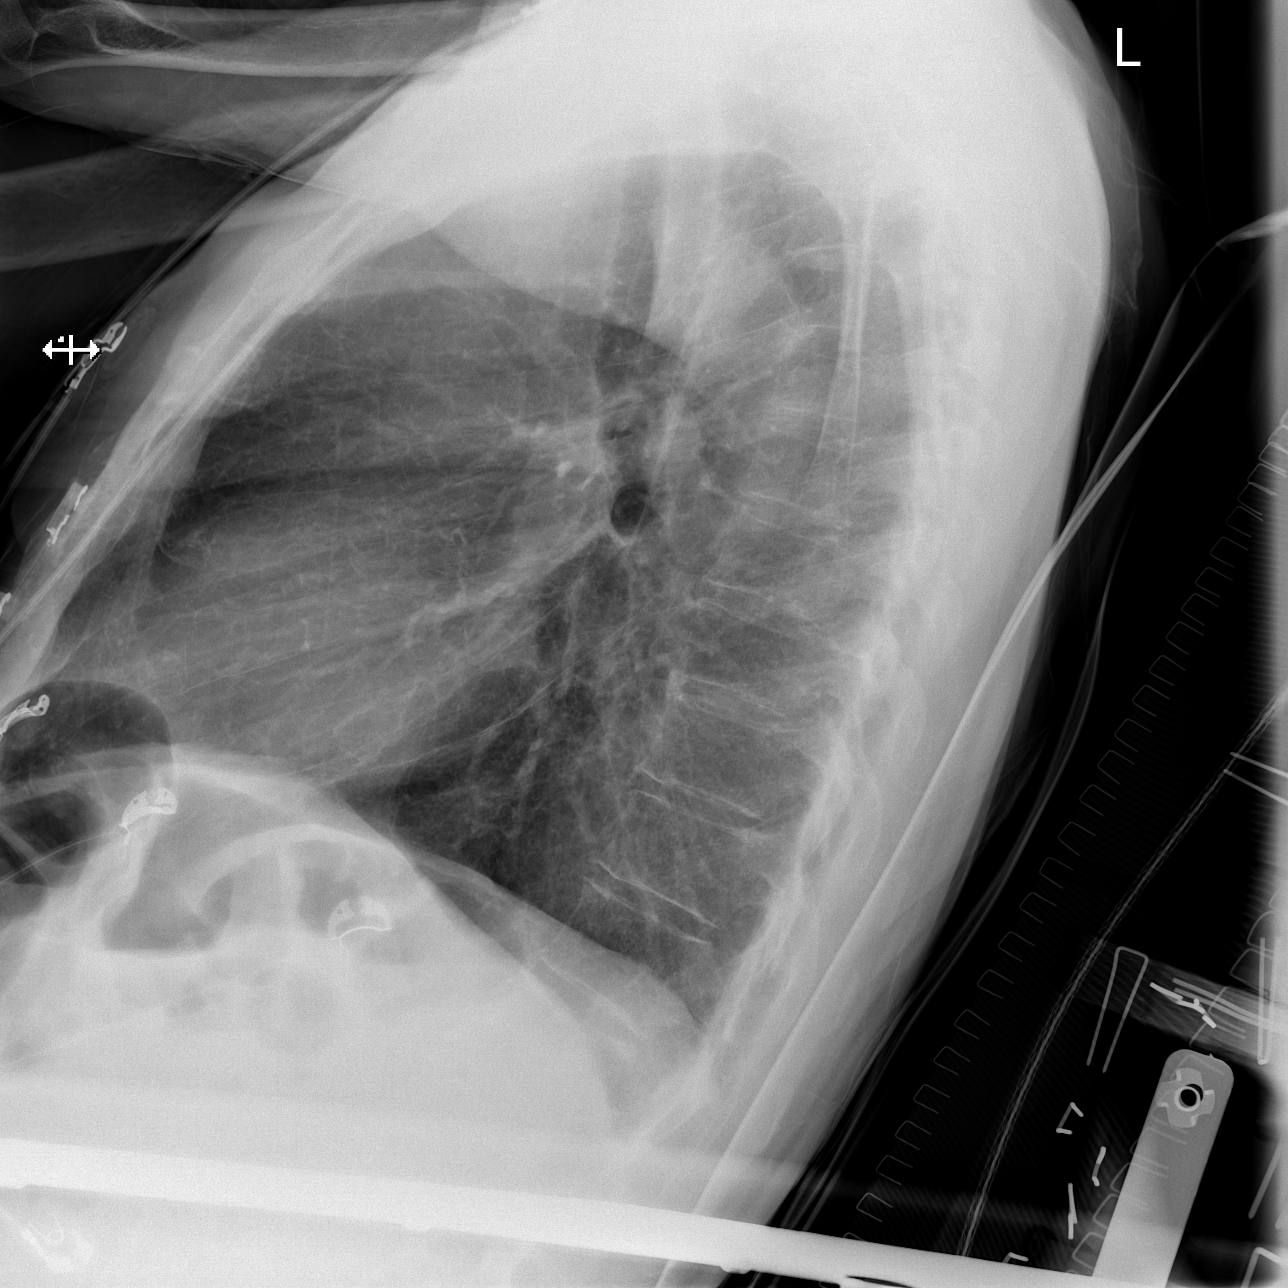

[w chest lat (2 of 2)]
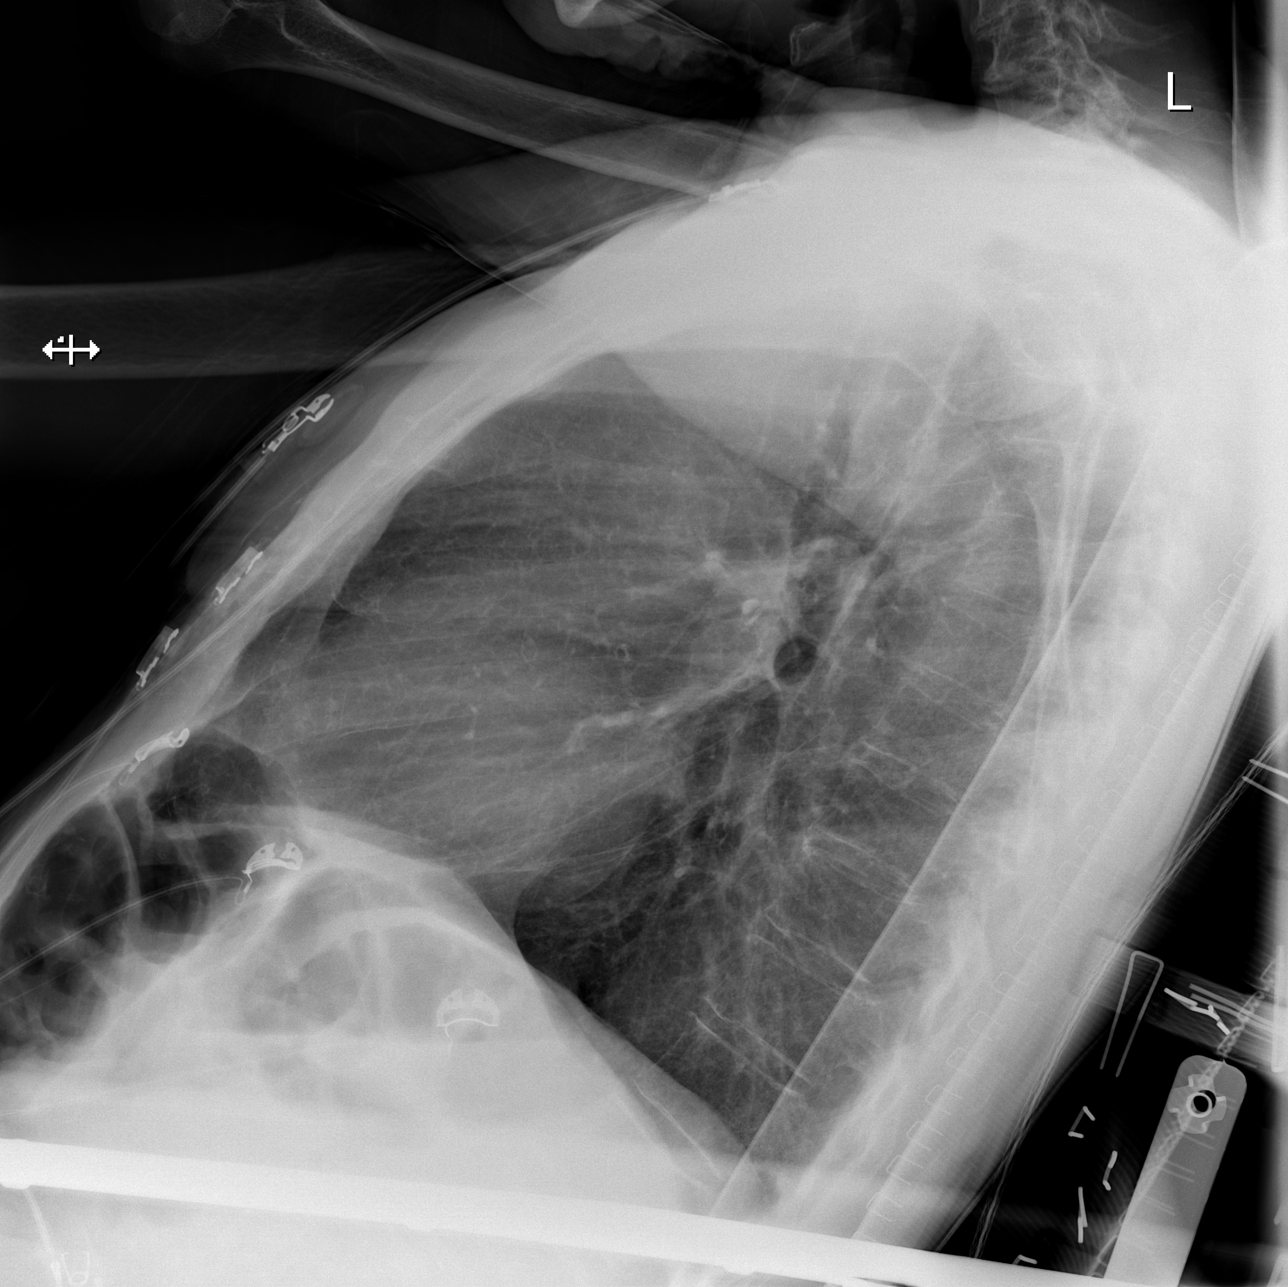

[x chest ap]
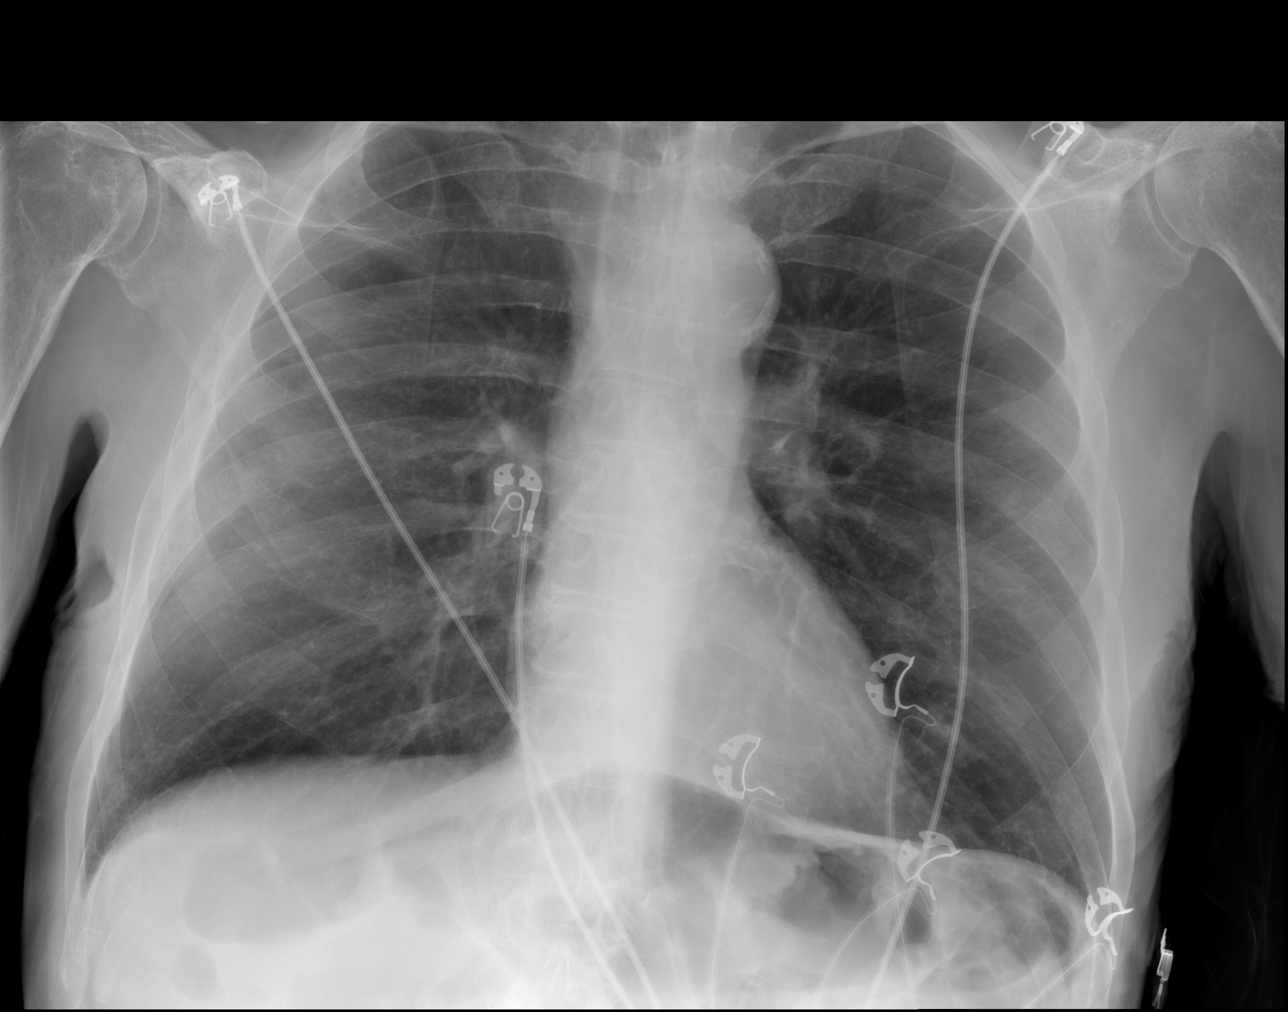

[3 of 3 positions shown; findings below may reference images not displayed]

FINDINGS: Cardiac size is within normal limits. There are no signs of
pulmonary edema or focal pulmonary consolidation. There is no
pleural effusion or pneumothorax.
IMPRESSION: No active cardiopulmonary disease.

## 2022-03-11 IMAGING — CT CT ANGIO CHEST
2 of 6 series · 18 of 36 positions shown · IV contrast (omnipaque)
Comparison: Chest CT 12/01/2020

CLINICAL DATA: Pulmonary embolism (PE) suspected, high prob.

EXAM:
CT ANGIOGRAPHY CHEST WITH CONTRAST
TECHNIQUE: Multidetector CT imaging of the chest was performed using the
standard protocol during bolus administration of intravenous
contrast. Multiplanar CT image reconstructions and MIPs were
obtained to evaluate the vascular anatomy.
CONTRAST:  80mL OMNIPAQUE IOHEXOL 350 MG/ML SOLN

[Series 5: thins · axial · 0.79mm/px · z∈[-333,-54]mm · 17 of 315 slices shown]
[im 18/315  lung]
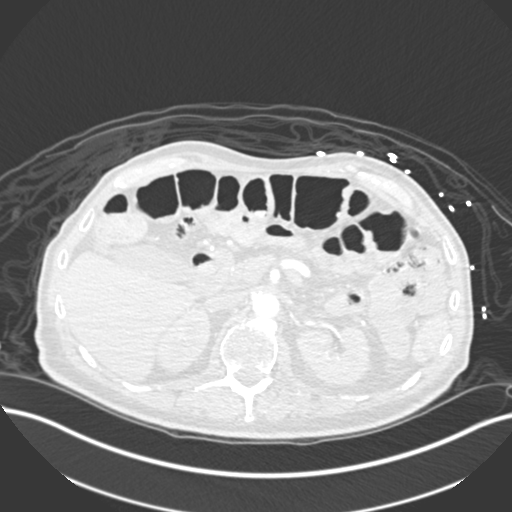
[im 35/315  mediastinal]
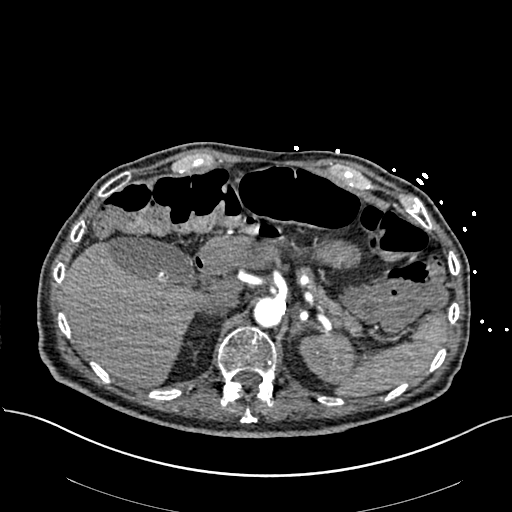
[im 53/315  lung]
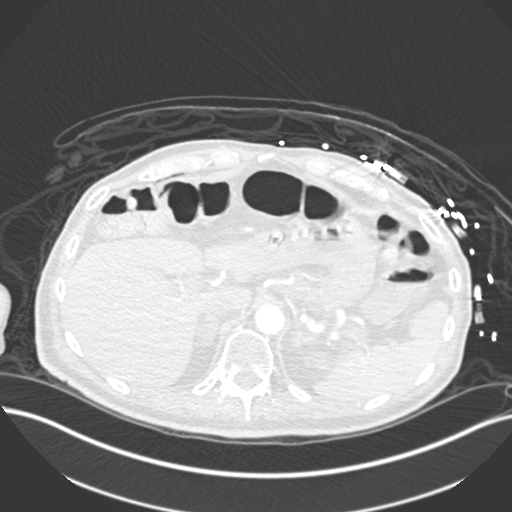
[im 70/315  mediastinal]
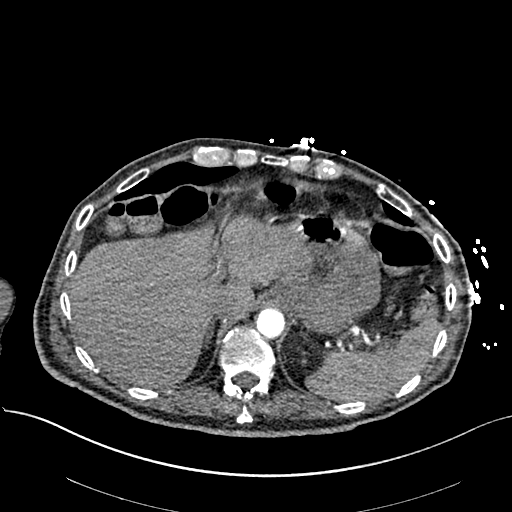
[im 88/315  lung]
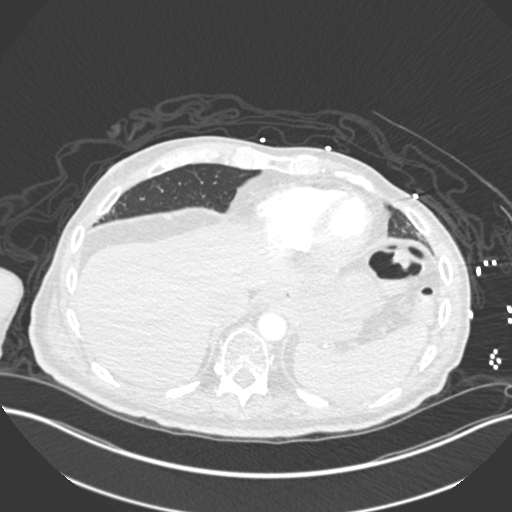
[im 105/315  mediastinal]
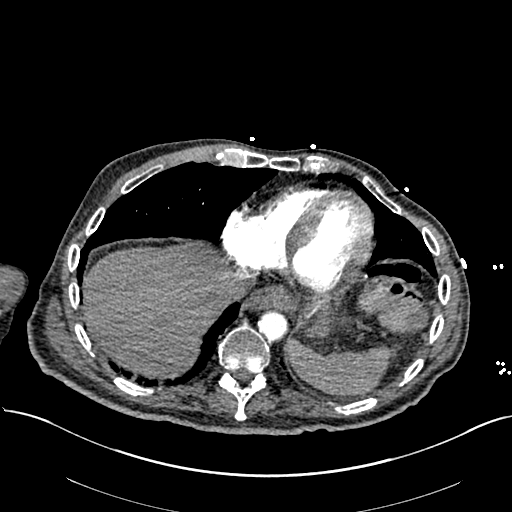
[im 123/315  lung]
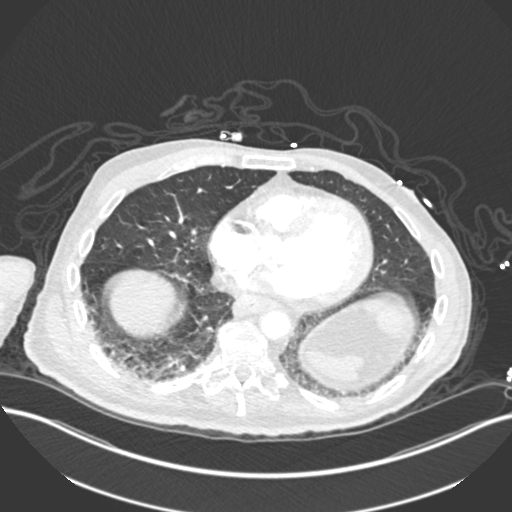
[im 140/315  mediastinal]
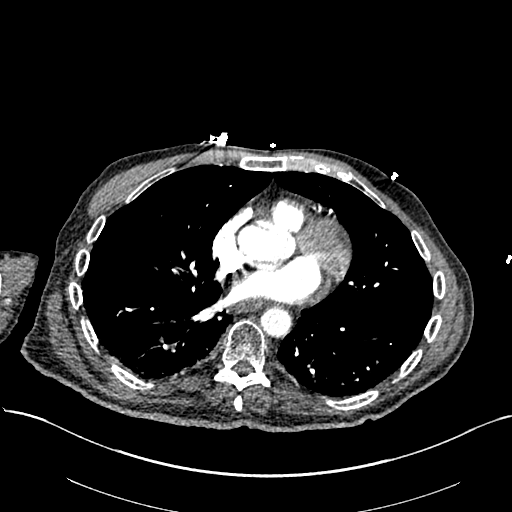
[im 158/315  lung]
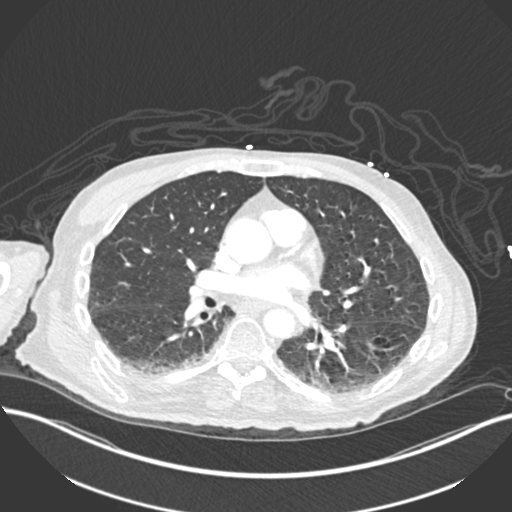
[im 175/315  mediastinal]
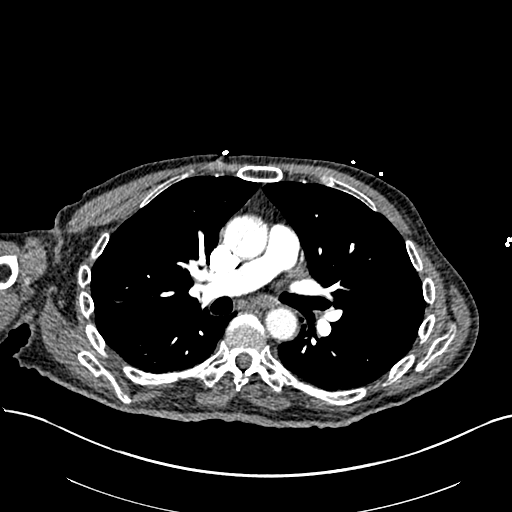
[im 192/315  lung]
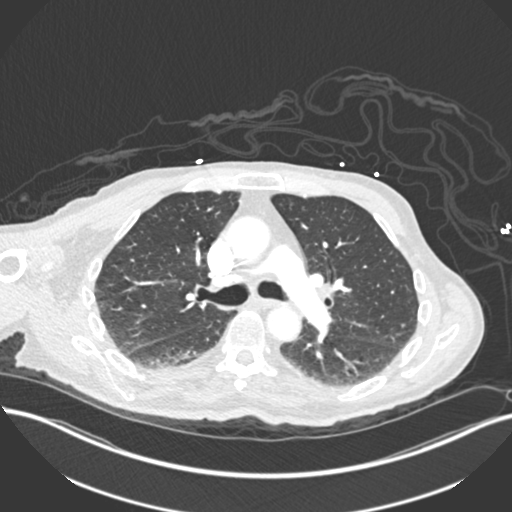
[im 210/315  mediastinal]
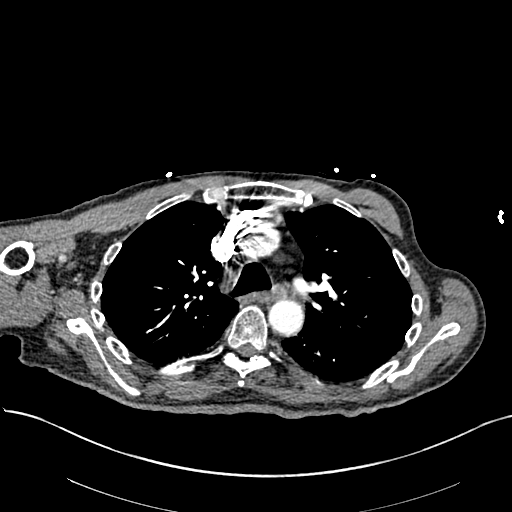
[im 227/315  lung]
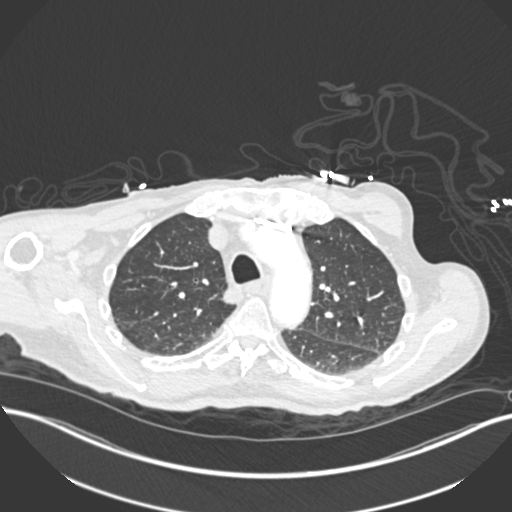
[im 245/315  mediastinal]
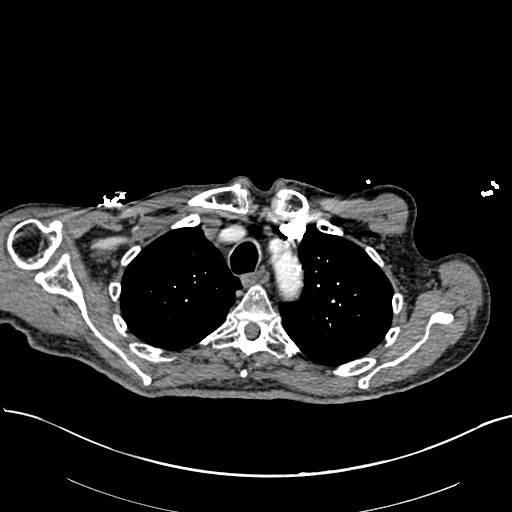
[im 262/315  lung]
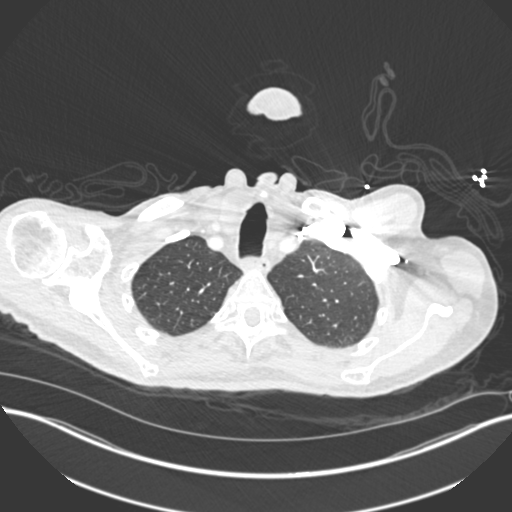
[im 280/315  mediastinal]
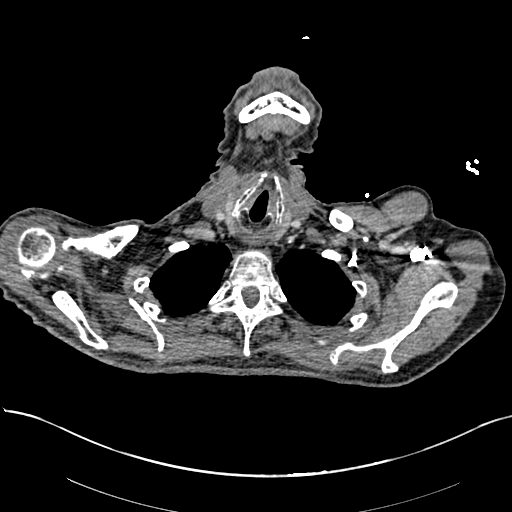
[im 297/315  lung]
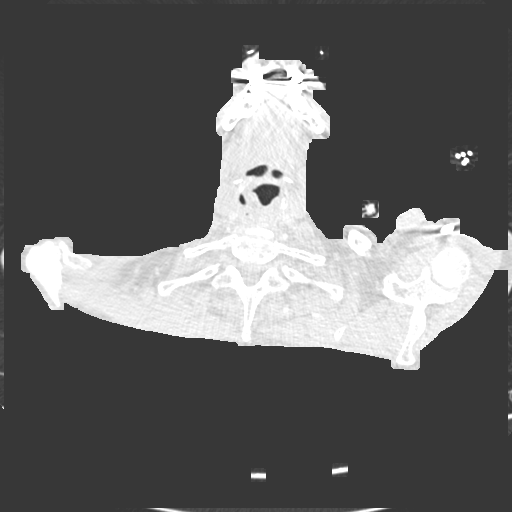

[Series 6: coronal mpr · coronal · 0.68mm/px · 1 of 114 slices shown]
[im 57/114  mediastinal]
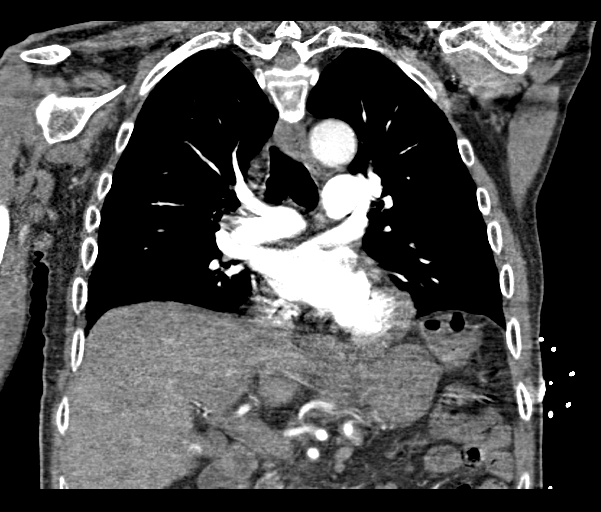

[18 of 36 positions shown; findings below may reference images not displayed]

FINDINGS: Cardiovascular: Contrast injection is sufficient to demonstrate
satisfactory opacification of the pulmonary arteries to the
segmental level. There is no pulmonary embolus or evidence of right
heart strain. The size of the main pulmonary artery is normal. Heart
size is normal, with no pericardial effusion. The course and caliber
of the aorta are normal. There is atherosclerotic calcification. No
acute aortic syndrome.

Mediastinum/Nodes: No mediastinal, hilar or axillary
lymphadenopathy. Normal visualized thyroid. Thoracic esophageal
course is normal.

Lungs/Pleura: Airways are patent. No pleural effusion, lobar
consolidation, pneumothorax or pulmonary infarction.

Upper Abdomen: Contrast bolus timing is not optimized for evaluation
of the abdominal organs. There is cholelithiasis.

Musculoskeletal: No chest wall abnormality. No bony spinal canal
stenosis.

Review of the MIP images confirms the above findings.
IMPRESSION: 1. No pulmonary embolus or other acute thoracic abnormality.
2.  Aortic atherosclerosis ([id number]).

## 2022-03-29 ENCOUNTER — Ambulatory Visit: Payer: PPO | Admitting: Podiatry
